# Patient Record
Sex: Female | Born: 1945 | Race: White | Hispanic: No | Marital: Married | Smoking: Never smoker
Health system: Southern US, Community
[De-identification: ages and names within clinical notes are randomized; demographics above are authoritative.]

## PROBLEM LIST (undated history)

## (undated) DIAGNOSIS — G4733 Obstructive sleep apnea (adult) (pediatric): Secondary | ICD-10-CM

## (undated) DIAGNOSIS — Z974 Presence of external hearing-aid: Secondary | ICD-10-CM

## (undated) DIAGNOSIS — M5137 Other intervertebral disc degeneration, lumbosacral region: Secondary | ICD-10-CM

## (undated) DIAGNOSIS — Z9889 Other specified postprocedural states: Secondary | ICD-10-CM

## (undated) DIAGNOSIS — D509 Iron deficiency anemia, unspecified: Secondary | ICD-10-CM

## (undated) DIAGNOSIS — G459 Transient cerebral ischemic attack, unspecified: Secondary | ICD-10-CM

## (undated) DIAGNOSIS — Z8679 Personal history of other diseases of the circulatory system: Secondary | ICD-10-CM

## (undated) DIAGNOSIS — E119 Type 2 diabetes mellitus without complications: Secondary | ICD-10-CM

## (undated) DIAGNOSIS — M51379 Other intervertebral disc degeneration, lumbosacral region without mention of lumbar back pain or lower extremity pain: Secondary | ICD-10-CM

## (undated) DIAGNOSIS — I639 Cerebral infarction, unspecified: Secondary | ICD-10-CM

## (undated) DIAGNOSIS — R252 Cramp and spasm: Secondary | ICD-10-CM

## (undated) DIAGNOSIS — Z7901 Long term (current) use of anticoagulants: Secondary | ICD-10-CM

## (undated) DIAGNOSIS — N3946 Mixed incontinence: Secondary | ICD-10-CM

## (undated) DIAGNOSIS — T8859XA Other complications of anesthesia, initial encounter: Secondary | ICD-10-CM

## (undated) DIAGNOSIS — D6859 Other primary thrombophilia: Secondary | ICD-10-CM

## (undated) DIAGNOSIS — Z8673 Personal history of transient ischemic attack (TIA), and cerebral infarction without residual deficits: Secondary | ICD-10-CM

## (undated) DIAGNOSIS — J189 Pneumonia, unspecified organism: Secondary | ICD-10-CM

## (undated) DIAGNOSIS — D649 Anemia, unspecified: Secondary | ICD-10-CM

## (undated) DIAGNOSIS — K219 Gastro-esophageal reflux disease without esophagitis: Secondary | ICD-10-CM

## (undated) DIAGNOSIS — Z794 Long term (current) use of insulin: Secondary | ICD-10-CM

## (undated) DIAGNOSIS — D759 Disease of blood and blood-forming organs, unspecified: Secondary | ICD-10-CM

## (undated) DIAGNOSIS — Z87898 Personal history of other specified conditions: Secondary | ICD-10-CM

## (undated) DIAGNOSIS — E7800 Pure hypercholesterolemia, unspecified: Secondary | ICD-10-CM

## (undated) DIAGNOSIS — R112 Nausea with vomiting, unspecified: Secondary | ICD-10-CM

## (undated) DIAGNOSIS — N183 Chronic kidney disease, stage 3 unspecified: Secondary | ICD-10-CM

## (undated) DIAGNOSIS — M1990 Unspecified osteoarthritis, unspecified site: Secondary | ICD-10-CM

## (undated) DIAGNOSIS — M5030 Other cervical disc degeneration, unspecified cervical region: Secondary | ICD-10-CM

## (undated) DIAGNOSIS — G4730 Sleep apnea, unspecified: Secondary | ICD-10-CM

## (undated) DIAGNOSIS — I10 Essential (primary) hypertension: Secondary | ICD-10-CM

## (undated) DIAGNOSIS — K5909 Other constipation: Secondary | ICD-10-CM

## (undated) DIAGNOSIS — E039 Hypothyroidism, unspecified: Secondary | ICD-10-CM

## (undated) DIAGNOSIS — I499 Cardiac arrhythmia, unspecified: Secondary | ICD-10-CM

## (undated) DIAGNOSIS — J45909 Unspecified asthma, uncomplicated: Secondary | ICD-10-CM

## (undated) DIAGNOSIS — Z86718 Personal history of other venous thrombosis and embolism: Secondary | ICD-10-CM

## (undated) DIAGNOSIS — Z973 Presence of spectacles and contact lenses: Secondary | ICD-10-CM

## (undated) DIAGNOSIS — E785 Hyperlipidemia, unspecified: Secondary | ICD-10-CM

## (undated) DIAGNOSIS — D689 Coagulation defect, unspecified: Secondary | ICD-10-CM

## (undated) DIAGNOSIS — I493 Ventricular premature depolarization: Secondary | ICD-10-CM

## (undated) DIAGNOSIS — M810 Age-related osteoporosis without current pathological fracture: Secondary | ICD-10-CM

## (undated) DIAGNOSIS — R0600 Dyspnea, unspecified: Secondary | ICD-10-CM

## (undated) DIAGNOSIS — E669 Obesity, unspecified: Secondary | ICD-10-CM

## (undated) DIAGNOSIS — H3530 Unspecified macular degeneration: Secondary | ICD-10-CM

## (undated) DIAGNOSIS — R011 Cardiac murmur, unspecified: Secondary | ICD-10-CM

## (undated) DIAGNOSIS — J4520 Mild intermittent asthma, uncomplicated: Secondary | ICD-10-CM

## (undated) HISTORY — PX: TONSILLECTOMY AND ADENOIDECTOMY: SUR1326

## (undated) HISTORY — DX: Ventricular premature depolarization: I49.3

## (undated) HISTORY — DX: Other primary thrombophilia: D68.59

## (undated) HISTORY — DX: Essential (primary) hypertension: I10

## (undated) HISTORY — DX: Gastro-esophageal reflux disease without esophagitis: K21.9

## (undated) HISTORY — PX: FOOT SURGERY: SHX648

## (undated) HISTORY — DX: Long term (current) use of anticoagulants: Z79.01

## (undated) HISTORY — PX: TONSILLECTOMY: SUR1361

## (undated) HISTORY — PX: BLEPHAROPLASTY: SUR158

## (undated) HISTORY — DX: Unspecified osteoarthritis, unspecified site: M19.90

## (undated) HISTORY — DX: Obesity, unspecified: E66.9

## (undated) HISTORY — PX: COLONOSCOPY: SHX174

## (undated) HISTORY — PX: CHOLECYSTECTOMY: SHX55

## (undated) HISTORY — DX: Coagulation defect, unspecified: D68.9

## (undated) HISTORY — PX: ABDOMINAL HYSTERECTOMY: SHX81

## (undated) HISTORY — PX: JOINT REPLACEMENT: SHX530

## (undated) HISTORY — DX: Pure hypercholesterolemia, unspecified: E78.00

## (undated) HISTORY — PX: FOOT TENDON SURGERY: SHX958

## (undated) HISTORY — DX: Transient cerebral ischemic attack, unspecified: G45.9

## (undated) HISTORY — DX: Other specified postprocedural states: Z98.890

## (undated) HISTORY — DX: Cardiac arrhythmia, unspecified: I49.9

## (undated) HISTORY — PX: EAR BIOPSY: SHX1480

## (undated) HISTORY — PX: HAND SURGERY: SHX662

---

## 1986-09-05 HISTORY — PX: TOTAL ABDOMINAL HYSTERECTOMY: SHX209

## 1986-09-05 HISTORY — PX: TOTAL VAGINAL HYSTERECTOMY: SHX2548

## 1991-09-06 DIAGNOSIS — Z86711 Personal history of pulmonary embolism: Secondary | ICD-10-CM

## 1991-09-06 HISTORY — PX: CHOLECYSTECTOMY: SHX55

## 1991-09-06 HISTORY — DX: Personal history of pulmonary embolism: Z86.711

## 1991-09-06 HISTORY — PX: CHOLECYSTECTOMY, LAPAROSCOPIC: SHX56

## 1992-09-05 HISTORY — PX: CARPAL TUNNEL RELEASE: SHX101

## 1999-11-17 ENCOUNTER — Inpatient Hospital Stay (HOSPITAL_COMMUNITY): Admission: AD | Admit: 1999-11-17 | Discharge: 1999-11-18 | Payer: Self-pay | Admitting: Cardiovascular Disease

## 2000-11-23 ENCOUNTER — Encounter: Payer: Self-pay | Admitting: Specialist

## 2000-11-29 ENCOUNTER — Ambulatory Visit (HOSPITAL_COMMUNITY): Admission: RE | Admit: 2000-11-29 | Discharge: 2000-11-29 | Payer: Self-pay | Admitting: Specialist

## 2004-05-25 ENCOUNTER — Ambulatory Visit (HOSPITAL_BASED_OUTPATIENT_CLINIC_OR_DEPARTMENT_OTHER): Admission: RE | Admit: 2004-05-25 | Discharge: 2004-05-25 | Payer: Self-pay | Admitting: Orthopedic Surgery

## 2004-05-25 ENCOUNTER — Ambulatory Visit (HOSPITAL_COMMUNITY): Admission: RE | Admit: 2004-05-25 | Discharge: 2004-05-25 | Payer: Self-pay | Admitting: Orthopedic Surgery

## 2004-06-01 ENCOUNTER — Ambulatory Visit (HOSPITAL_COMMUNITY): Admission: RE | Admit: 2004-06-01 | Discharge: 2004-06-01 | Payer: Self-pay | Admitting: Orthopedic Surgery

## 2004-09-05 HISTORY — PX: OTHER SURGICAL HISTORY: SHX169

## 2005-03-05 HISTORY — PX: TOTAL KNEE ARTHROPLASTY: SHX125

## 2011-05-20 ENCOUNTER — Encounter: Payer: Self-pay | Admitting: *Deleted

## 2011-05-20 ENCOUNTER — Encounter: Payer: Self-pay | Admitting: Cardiovascular Disease

## 2011-05-23 ENCOUNTER — Ambulatory Visit (INDEPENDENT_AMBULATORY_CARE_PROVIDER_SITE_OTHER): Payer: Medicare Other | Admitting: Internal Medicine

## 2011-05-23 ENCOUNTER — Encounter: Payer: Self-pay | Admitting: Internal Medicine

## 2011-05-23 DIAGNOSIS — R42 Dizziness and giddiness: Secondary | ICD-10-CM

## 2011-05-23 DIAGNOSIS — I493 Ventricular premature depolarization: Secondary | ICD-10-CM | POA: Insufficient documentation

## 2011-05-23 DIAGNOSIS — I5030 Unspecified diastolic (congestive) heart failure: Secondary | ICD-10-CM | POA: Insufficient documentation

## 2011-05-23 DIAGNOSIS — I4949 Other premature depolarization: Secondary | ICD-10-CM

## 2011-05-23 DIAGNOSIS — I110 Hypertensive heart disease with heart failure: Secondary | ICD-10-CM | POA: Insufficient documentation

## 2011-05-23 DIAGNOSIS — I10 Essential (primary) hypertension: Secondary | ICD-10-CM | POA: Insufficient documentation

## 2011-05-23 DIAGNOSIS — I472 Ventricular tachycardia: Secondary | ICD-10-CM

## 2011-05-23 HISTORY — DX: Essential (primary) hypertension: I10

## 2011-05-23 HISTORY — DX: Ventricular premature depolarization: I49.3

## 2011-05-23 HISTORY — DX: Hypertensive heart disease with heart failure: I11.0

## 2011-05-23 HISTORY — DX: Dizziness and giddiness: R42

## 2011-05-23 NOTE — Progress Notes (Signed)
Pamela [name] is a pleasant 65 y.o. WF patient with a h/o obesity, HTN, DM, and protein S deficiency who presents for further evaluation of PVCs and palpitations.  She reports that over the past 2-3 years she has had palpitations.  These are typically brief, lasting only 1-2 seconds.  She describes these as "skipped beats".  They are worse when she is tired.  She also finds that walking long distances or heavy house work will bring them on.  She denies associated chest pain or SOB.  She reports occasional mild dizziness.  She was evaluated by her PCP who placed an event monitor.  This revealed PVCs as well as NSVT. She reports having a normal stress test "years ago".   She had an echo 2010 which revealed preserved EF at that time. She reports occasional postural dizziness.  This occurs during the night when she gets up to go to the bathroom, or after standing after a long car ride.   She denies symptoms of chest pain, shortness of breath, orthopnea, PND, lower extremity edema, presyncope, syncope, or neurologic sequela. The patient is tolerating medications without difficulties and is otherwise without complaint today.   Past Medical History  Diagnosis Date  . HTN (hypertension)   . GERD (gastroesophageal reflux disease)   . Hypercholesteremia   . Sinusitis   . Diabetes mellitus   . Obesity   . DJD (degenerative joint disease)   . PVC's (premature ventricular contractions)   . Protein S deficiency     prior DVT L knee, L arm DVT,  followed by Dr Kathrin Ruddy at [facility]   Past Surgical History  Procedure Date  . Tonsillectomy   . Total abdominal hysterectomy 1988  . Cholecystectomy 1993  . Left knee replacement 2006  . Carpal tunnel release 1994    bilateral    Current Outpatient Prescriptions  Medication Sig Dispense Refill  . aspirin 325 MG tablet Take 325 mg by mouth 2 (two) times daily.        . carvedilol (COREG) 12.5 MG tablet Take 12.5 mg by mouth 2 (two) times daily with a meal.         . celecoxib (CELEBREX) 200 MG capsule Take 200 mg by mouth 2 (two) times daily.        . Cinnamon 500 MG capsule Take 500 mg by mouth daily.        Marland Kitchen doxazosin (CARDURA) 4 MG tablet Take 4 mg by mouth at bedtime.        . furosemide (LASIX) 20 MG tablet Take 20 mg by mouth every other day.        . Multiple Vitamins-Minerals (ONE-A-DAY WOMENS 50 PLUS PO) Take 1 tablet by mouth daily.        . pioglitazone-metformin (ACTOPLUS MET) 15-850 MG per tablet Take 1 tablet by mouth 2 (two) times daily with a meal.        . polycarbophil (FIBERCON) 625 MG tablet Take 625 mg by mouth daily.        . repaglinide (PRANDIN) 2 MG tablet Take 2 mg by mouth 3 (three) times daily before meals.        . traZODone (DESYREL) 50 MG tablet Take 50 mg by mouth 2 (two) times daily.        . valsartan (DIOVAN) 160 MG tablet Take 160 mg by mouth 2 (two) times daily.          Allergies  Allergen Reactions  . Amoxapine And Related  Hives  . Darvon Nausea And Vomiting  . Ketek (Telithromycin)   . Naproxen Hives    History   Social History  . Marital Status: Married    Spouse Name: N/A    Number of Children: N/A  . Years of Education: N/A   Occupational History  . Not on file.   Social History Main Topics  . Smoking status: Never Smoker   . Smokeless tobacco: Not on file  . Alcohol Use: No  . Drug Use: No  . Sexually Active: Not on file   Other Topics Concern  . Not on file   Social History Narrative   Lives with spouse in [place].  2 grown daughters.Retired Print production planner      Family History  Problem Relation Age of Onset  . Cirrhosis Mother   . Coronary artery disease Father   . Protein S deficiency Sister     prior DVT  . Antithrombin III deficiency Mother     multiple emboli  . Protein S deficiency Daughter   She denies FH of sudden death or arrhythmia  ROS- All systems are reviewed and negative except as per the HPI above  Physical Exam: Filed Vitals:   05/23/11 1456  BP:  132/80  Pulse: 72  Height: 5\' 4"  (1.626 m)  Weight: 216 lb 6.4 oz (98.158 kg)    GEN- The patient is obese appearing, alert and oriented x 3 today.   Head- normocephalic, atraumatic Eyes-  Sclera clear, conjunctiva pink Ears- hearing intact Oropharynx- clear Neck- supple, no JVP Lymph- no cervical lymphadenopathy Lungs- Clear to ausculation bilaterally, normal work of breathing Heart- Regular rate and rhythm, no murmurs, rubs or gallops, PMI not laterally displaced GI- soft, NT, ND, + BS Extremities- no clubbing, cyanosis, or edema MS- no significant deformity or atrophy Skin- no rash or lesion Psych- euthymic mood, full affect Neuro- strength and sensation are intact  EKG today reveals sinus rhythm 72 bpm with PVCs, she has 2 PVC morphologies on her ekg today (#1 is a LBB L superior axis, #2 is a RBB superior axis).  The PVCs are not closely coupled to the preceding T wave.  Event monitor from 9/12 is reviewed and reveals sinus rhythm with occasional PVCs.  There are at least 2 separate PVC morphologies.  PVCs are not closely coupled to T waves.  A single 5 beat run of NSVT is observed at 120-130 bpm.  Assessment and Plan:

## 2011-05-23 NOTE — Assessment & Plan Note (Signed)
Stable No change required today   She will return in 5 weeks for further assessment after her stress test and echo results are available.

## 2011-05-23 NOTE — Assessment & Plan Note (Signed)
The patient has PVCs and nonsustained ventricular tachycardia.  She has at least 2 separate PVC morphologies.  She has been asymptomatic with her NSVT.  Though in 2010 her EF was normal, I think that it is prudent at this time to repeat her echo to evaluate for any structural changes since that time.  I will also obtain a lexiscan myoview to evaluate for ischemic as a cause.  Given her severe knee arthritis, she does not feel that she can walk on a treadmill. She will continue coreg. I have recommended that she have a thyroid profile obtained by her PCP. If her above workup is normal, no further cardiac testing is planned at this time.

## 2011-05-23 NOTE — Assessment & Plan Note (Signed)
The patient has occasional postural dizziness.  This is most likely related to hypotension with standing and likely due to her medications.  Her combination of coreg, doxazosin, lasix, and diovan are almost certainly the cause.  If her symptoms do not improve, I would recommend that her doxasozin be stopped. I have instructed her to perform isometric exercises before standing and to assume a recombant position if she develops postural dizziness to avoid fall/ injury. Adequate hydration is also encouraged. No further workup is planned at his time.

## 2011-05-23 NOTE — Patient Instructions (Addendum)
Your physician has requested that you have an echocardiogram. Echocardiography is a painless test that uses sound waves to create images of your heart. It provides your doctor with information about the size and shape of your heart and how well your heart's chambers and valves are working. This procedure takes approximately one hour. There are no restrictions for this procedure.  Your physician has requested that you have a lexiscan myoview. For further information please visit [email or web address] Please follow instruction sheet, as given.  Your physician recommends that you schedule a follow-up appointment in: 4 weeks

## 2011-05-25 ENCOUNTER — Encounter: Payer: Self-pay | Admitting: *Deleted

## 2011-05-30 ENCOUNTER — Encounter: Payer: Self-pay | Admitting: *Deleted

## 2011-06-06 ENCOUNTER — Ambulatory Visit (HOSPITAL_COMMUNITY): Payer: Medicare Other | Attending: Internal Medicine | Admitting: Radiology

## 2011-06-06 ENCOUNTER — Ambulatory Visit (HOSPITAL_BASED_OUTPATIENT_CLINIC_OR_DEPARTMENT_OTHER): Payer: Medicare Other | Admitting: Radiology

## 2011-06-06 DIAGNOSIS — I493 Ventricular premature depolarization: Secondary | ICD-10-CM

## 2011-06-06 DIAGNOSIS — D6859 Other primary thrombophilia: Secondary | ICD-10-CM | POA: Insufficient documentation

## 2011-06-06 DIAGNOSIS — R42 Dizziness and giddiness: Secondary | ICD-10-CM | POA: Insufficient documentation

## 2011-06-06 DIAGNOSIS — I4729 Other ventricular tachycardia: Secondary | ICD-10-CM | POA: Insufficient documentation

## 2011-06-06 DIAGNOSIS — I472 Ventricular tachycardia, unspecified: Secondary | ICD-10-CM | POA: Insufficient documentation

## 2011-06-06 DIAGNOSIS — I059 Rheumatic mitral valve disease, unspecified: Secondary | ICD-10-CM

## 2011-06-06 DIAGNOSIS — I4949 Other premature depolarization: Secondary | ICD-10-CM | POA: Insufficient documentation

## 2011-06-06 DIAGNOSIS — I080 Rheumatic disorders of both mitral and aortic valves: Secondary | ICD-10-CM | POA: Insufficient documentation

## 2011-06-06 DIAGNOSIS — R079 Chest pain, unspecified: Secondary | ICD-10-CM

## 2011-06-06 DIAGNOSIS — R002 Palpitations: Secondary | ICD-10-CM | POA: Insufficient documentation

## 2011-06-06 DIAGNOSIS — E669 Obesity, unspecified: Secondary | ICD-10-CM | POA: Insufficient documentation

## 2011-06-06 DIAGNOSIS — E119 Type 2 diabetes mellitus without complications: Secondary | ICD-10-CM | POA: Insufficient documentation

## 2011-06-06 DIAGNOSIS — I10 Essential (primary) hypertension: Secondary | ICD-10-CM | POA: Insufficient documentation

## 2011-06-06 MED ORDER — REGADENOSON 0.4 MG/5ML IV SOLN
0.4000 mg | Freq: Once | INTRAVENOUS | Status: AC
  Administered 2011-06-06: 0.4 mg via INTRAVENOUS

## 2011-06-06 MED ORDER — AMINOPHYLLINE 25 MG/ML IV SOLN
75.0000 mg | Freq: Once | INTRAVENOUS | Status: AC
  Administered 2011-06-06: 75 mg via INTRAVENOUS

## 2011-06-06 MED ORDER — TECHNETIUM TC 99M TETROFOSMIN IV KIT
11.0000 | PACK | Freq: Once | INTRAVENOUS | Status: AC | PRN
  Administered 2011-06-06: 11 via INTRAVENOUS

## 2011-06-06 MED ORDER — TECHNETIUM TC 99M TETROFOSMIN IV KIT
33.0000 | PACK | Freq: Once | INTRAVENOUS | Status: AC | PRN
  Administered 2011-06-06: 33 via INTRAVENOUS

## 2011-06-06 NOTE — Progress Notes (Signed)
[facility] SITE 3 NUCLEAR MED [address] [place] [address] [phone]  Cardiology Nuclear Med Study  Pamela [name] is a 65 y.o. female [id number] 05/24/1946   Nuclear Med Background Indication for Stress Test:  Evaluation for Ischemia History: 2010 Echo: NL, '01 GXT: NL, '01 Heart Catheterization: EF 65% NL and '01 Myocardial Perfusion Study: (+) false Anterior wall ischemia Cardiac Risk Factors: Family History - CAD, Hypertension, Lipids and NIDDM  Symptoms:  Dizziness and Palpitations   Nuclear Pre-Procedure Caffeine/Decaff Intake:  4 pm NPO After: 11:00pm   Lungs:  clear IV 0.9% NS with Angio Cath:  22g  IV Site: R Hand  IV Started by:  Cathlyn Parsons, RN  Chest Size (in):  44 Cup Size: C  Height: 5\' 4"  (1.626 m)  Weight:  214 lb (97.07 kg)  BMI:  Body mass index is 36.73 kg/(m^2). Tech Comments:  Coreg held x 15 hrs. This patient had Lexiscan and had continuing symptoms and finally all over joint pain. She was given 75mg  IV of Aminophyline with a 10cc NS flush. This reversed all the symptoms and she felt fine. Then she was sitting in the waiting room and she started having 5/10 back pain between her shoulder blades. Her pictures were checked and they were shown to P.Ross DOD for this day. The patient now a 2/10 Dr.Ross sent her home without any problems. S.Williams EMTP    Nuclear Med Study 1 or 2 day study: 1 day  Stress Test Type:  Eugenie Birks  Reading MD: Cassell Clement, MD  Order Authorizing Provider:  J.Allred  Resting Radionuclide: Technetium 79m Tetrofosmin  Resting Radionuclide Dose: 11.0 mCi   Stress Radionuclide:  Technetium 4m Tetrofosmin  Stress Radionuclide Dose: 33.0 mCi           Stress Protocol Rest HR: 68 Stress HR: 95  Rest BP: 160/73 Stress BP: 165/75  Exercise Time (min): n/a METS: n/a   Predicted Max HR: 154 bpm % Max HR: 61.69 bpm Rate Pressure Product: 57846   Dose of Adenosine (mg):  n/a Dose of Lexiscan: 0.4  mg  Dose of Atropine (mg): n/a Dose of Dobutamine: n/a mcg/kg/min (at max HR)  Stress Test Technologist: Milana Na, EMT-P  Nuclear Technologist:  Domenic Polite, CNMT     Rest Procedure:  Myocardial perfusion imaging was performed at rest 45 minutes following the intravenous administration of Technetium 62m Tetrofosmin. Rest ECG: NSR  Stress Procedure:  The patient received IV Lexiscan 0.4 mg over 15-seconds.  Technetium 82m Tetrofosmin injected at 30-seconds.  There were no significant changes and freq pvcs(normal per patient) with Lexiscan.  Quantitative spect images were obtained after a 45 minute delay. Stress ECG: No significant ST segment change suggestive of ischemia.  QPS Raw Data Images:  Normal; no motion artifact; normal heart/lung ratio. Stress Images:  Normal homogeneous uptake in all areas of the myocardium. Rest Images:  Normal homogeneous uptake in all areas of the myocardium. Subtraction (SDS):  No evidence of ischemia. Transient Ischemic Dilatation (Normal <1.22):  0.95 Lung/Heart Ratio (Normal <0.45):  0.19  Quantitative Gated Spect Images QGS EDV:  n/a QGS ESV:  n/a QGS cine images:  Study not gated QGS EF: Study not gated  Impression Exercise Capacity:  Lexiscan with no exercise. BP Response:  Normal blood pressure response. Clinical Symptoms:  Mild chest pain/dyspnea. ECG Impression:  No significant ST segment change suggestive of ischemia.  Frequent PVCs Comparison with Prior Nuclear Study: No images to compare  Overall Impression:  Low risk stress nuclear study.  No ischemia.  Study non-gated.    Cassell Clement

## 2011-06-09 ENCOUNTER — Telehealth: Payer: Self-pay | Admitting: Internal Medicine

## 2011-06-09 NOTE — Telephone Encounter (Signed)
Pt wants to know tests results.

## 2011-06-09 NOTE — Telephone Encounter (Signed)
Returned call to patient and gave her the results of both test

## 2011-06-29 ENCOUNTER — Encounter: Payer: Self-pay | Admitting: Internal Medicine

## 2011-06-29 ENCOUNTER — Ambulatory Visit: Payer: Medicare Other | Admitting: Internal Medicine

## 2011-06-29 DIAGNOSIS — I493 Ventricular premature depolarization: Secondary | ICD-10-CM

## 2011-06-29 DIAGNOSIS — I10 Essential (primary) hypertension: Secondary | ICD-10-CM

## 2011-06-29 NOTE — Progress Notes (Signed)
Addended by: Hillis Range on: 06/29/2011 01:44 PM   Modules accepted: Level of Service

## 2011-06-29 NOTE — Patient Instructions (Signed)
Your physician wants you to follow-up in: 6 months with Dr. Allred. You will receive a reminder letter in the mail two months in advance. If you don't receive a letter, please call our office to schedule the follow-up appointment.  

## 2011-06-29 NOTE — Assessment & Plan Note (Signed)
Stable No change required today  

## 2011-06-29 NOTE — Assessment & Plan Note (Addendum)
Stable at this time Recent echo reveals preserved EF and myoview is low risk No further CV workup is planned at this time No medication changes today.  If PVCs worsen, we will consider flecainide vs ablation

## 2011-06-29 NOTE — Progress Notes (Addendum)
Pamela [name] is a pleasant 65 y.o. WF patient with a h/o obesity, HTN, DM, and protein S deficiency who presents for follow-up of PVCs and palpitations.   She continues to have occasional palpitations but feels that these are stable.  She denies associated chest pain or SOB.  She reports occasional mild dizziness.    She denies symptoms of chest pain, shortness of breath, orthopnea, PND, lower extremity edema, presyncope, syncope, or neurologic sequela. The patient is tolerating medications without difficulties and is otherwise without complaint today.   Past Medical History  Diagnosis Date  . HTN (hypertension)   . GERD (gastroesophageal reflux disease)   . Hypercholesteremia   . Sinusitis   . Diabetes mellitus   . Obesity   . DJD (degenerative joint disease)   . PVC's (premature ventricular contractions)   . Protein S deficiency     prior DVT L knee, L arm DVT,  followed by Dr Kathrin Ruddy at [facility]   Past Surgical History  Procedure Date  . Tonsillectomy   . Total abdominal hysterectomy 1988  . Cholecystectomy 1993  . Left knee replacement 2006  . Carpal tunnel release 1994    bilateral    Current Outpatient Prescriptions  Medication Sig Dispense Refill  . aspirin 325 MG tablet Take 325 mg by mouth 2 (two) times daily.        . carvedilol (COREG) 12.5 MG tablet Take 12.5 mg by mouth 2 (two) times daily with a meal.        . celecoxib (CELEBREX) 200 MG capsule Take 200 mg by mouth daily.       . Cinnamon 500 MG capsule Take 500 mg by mouth daily.        Marland Kitchen doxazosin (CARDURA) 4 MG tablet Take 4 mg by mouth at bedtime.        . furosemide (LASIX) 20 MG tablet Take 20 mg by mouth every other day.        . Multiple Vitamins-Minerals (ONE-A-DAY WOMENS 50 PLUS PO) Take 1 tablet by mouth daily.        . pioglitazone-metformin (ACTOPLUS MET) 15-850 MG per tablet Take 1 tablet by mouth 2 (two) times daily with a meal.        . polycarbophil (FIBERCON) 625 MG tablet Take 625 mg by mouth  daily.        . repaglinide (PRANDIN) 2 MG tablet Take 4 mg by mouth 3 (three) times daily before meals.       . traZODone (DESYREL) 50 MG tablet Take 50 mg by mouth 2 (two) times daily.        . valsartan (DIOVAN) 160 MG tablet Take 160 mg by mouth 2 (two) times daily.          Allergies  Allergen Reactions  . Amoxapine And Related Hives  . Darvon Nausea And Vomiting  . Ketek (Telithromycin)   . Naproxen Hives    History   Social History  . Marital Status: Married    Spouse Name: N/A    Number of Children: N/A  . Years of Education: N/A   Occupational History  . Not on file.   Social History Main Topics  . Smoking status: Never Smoker   . Smokeless tobacco: Not on file  . Alcohol Use: No  . Drug Use: No  . Sexually Active: Not on file   Other Topics Concern  . Not on file   Social History Narrative   Lives with spouse in  [place].  2 grown daughters.Retired Print production planner      Family History  Problem Relation Age of Onset  . Cirrhosis Mother   . Coronary artery disease Father   . Protein S deficiency Sister     prior DVT  . Antithrombin III deficiency Mother     multiple emboli  . Protein S deficiency Daughter    She denies FH of sudden death or arrhythmia  ROS- All systems are reviewed and negative except as per the HPI above  Physical Exam: Filed Vitals:   06/29/11 1122  BP: 124/70  Pulse: 63  Height: 5\' 4"  (1.626 m)  Weight: 218 lb 12.8 oz (99.247 kg)   GEN- The patient is obese appearing, alert and oriented x 3 today.   Head- normocephalic, atraumatic Eyes-  Sclera clear, conjunctiva pink Ears- hearing intact Oropharynx- clear Neck- supple, no JVP Lymph- no cervical lymphadenopathy Lungs- Clear to ausculation bilaterally, normal work of breathing Heart- Regular rate and rhythm, no murmurs, rubs or gallops, PMI not laterally displaced GI- soft, NT, ND, + BS Extremities- no clubbing, cyanosis, or edema MS- no significant deformity or  atrophy Skin- no rash or lesion Psych- euthymic mood, full affect Neuro- strength and sensation are intact  Event monitor from 9/12 is reviewed and reveals sinus rhythm with occasional PVCs.  There are at least 2 separate PVC morphologies.  PVCs are not closely coupled to T waves.  A single 5 beat run of NSVT is observed at 120-130 bpm.  Assessment and Plan:      See above

## 2011-06-30 ENCOUNTER — Encounter: Payer: Self-pay | Admitting: Internal Medicine

## 2011-12-28 ENCOUNTER — Ambulatory Visit (INDEPENDENT_AMBULATORY_CARE_PROVIDER_SITE_OTHER): Payer: Medicare Other | Admitting: Internal Medicine

## 2011-12-28 ENCOUNTER — Encounter: Payer: Self-pay | Admitting: Internal Medicine

## 2011-12-28 VITALS — BP 138/76 | HR 68 | Resp 18 | Ht 65.0 in | Wt 212.1 lb

## 2011-12-28 DIAGNOSIS — I4949 Other premature depolarization: Secondary | ICD-10-CM

## 2011-12-28 DIAGNOSIS — I10 Essential (primary) hypertension: Secondary | ICD-10-CM

## 2011-12-28 DIAGNOSIS — I493 Ventricular premature depolarization: Secondary | ICD-10-CM

## 2011-12-28 NOTE — Assessment & Plan Note (Signed)
Controlled. No changes. 

## 2011-12-28 NOTE — Progress Notes (Signed)
PCP:  Lucila Maine, MD, MD  The patient presents today for routine electrophysiology followup.  Since last being seen in our clinic, the patient reports doing very well.  She has rare palpitations.  Today, she denies symptoms of  chest pain, shortness of breath,  dizziness, presyncope, or syncope.  She has stable mild pedal edema.  The patient feels that she is tolerating medications without difficulties and is otherwise without complaint today.   Past Medical History  Diagnosis Date  . HTN (hypertension)   . GERD (gastroesophageal reflux disease)   . Hypercholesteremia   . Sinusitis   . Diabetes mellitus   . Obesity   . DJD (degenerative joint disease)   . PVC's (premature ventricular contractions)   . Protein S deficiency     prior DVT L knee, L arm DVT,  followed by Dr Kathrin Ruddy at [facility]   Past Surgical History  Procedure Date  . Tonsillectomy   . Total abdominal hysterectomy 1988  . Cholecystectomy 1993  . Left knee replacement 2006  . Carpal tunnel release 1994    bilateral    Current Outpatient Prescriptions  Medication Sig Dispense Refill  . aspirin 325 MG tablet Take 325 mg by mouth 2 (two) times daily.        . carvedilol (COREG) 12.5 MG tablet Take 12.5 mg by mouth 2 (two) times daily with a meal.        . celecoxib (CELEBREX) 200 MG capsule Take 200 mg by mouth daily.       . Cinnamon 500 MG capsule Take 1,000 mg by mouth daily.       Marland Kitchen doxazosin (CARDURA) 4 MG tablet Take 4 mg by mouth at bedtime.        . furosemide (LASIX) 20 MG tablet Take 20 mg by mouth every other day.        . Multiple Vitamins-Minerals (ONE-A-DAY WOMENS 50 PLUS PO) Take 1 tablet by mouth daily.        . pioglitazone-metformin (ACTOPLUS MET) 15-850 MG per tablet Take 1 tablet by mouth 2 (two) times daily with a meal.        . polycarbophil (FIBERCON) 625 MG tablet Take 625 mg by mouth daily.        . repaglinide (PRANDIN) 2 MG tablet Take 4 mg by mouth 3 (three) times daily before meals.         . traZODone (DESYREL) 50 MG tablet Take 50 mg by mouth 2 (two) times daily.        . valsartan (DIOVAN) 160 MG tablet Take 160 mg by mouth 2 (two) times daily.          Allergies  Allergen Reactions  . Amoxapine And Related Hives  . Darvon Nausea And Vomiting  . Ketek (Telithromycin)   . Naproxen Hives    History   Social History  . Marital Status: Married    Spouse Name: N/A    Number of Children: N/A  . Years of Education: N/A   Occupational History  . Not on file.   Social History Main Topics  . Smoking status: Never Smoker   . Smokeless tobacco: Not on file  . Alcohol Use: No  . Drug Use: No  . Sexually Active: Not on file   Other Topics Concern  . Not on file   Social History Narrative   Lives with spouse in [place].  2 grown daughters.Retired Print production planner      Family History  Problem  Relation Age of Onset  . Cirrhosis Mother   . Coronary artery disease Father   . Protein S deficiency Sister     prior DVT  . Antithrombin III deficiency Mother     multiple emboli  . Protein S deficiency Daughter     Physical Exam: Filed Vitals:   12/28/11 0949  BP: 138/76  Pulse: 68  Resp: 18  Height: 5\' 5"  (1.651 m)  Weight: 212 lb 1.9 oz (96.217 kg)    GEN- The patient is well appearing, alert and oriented x 3 today.   Head- normocephalic, atraumatic Eyes-  Sclera clear, conjunctiva pink Ears- hearing intact Oropharynx- clear Neck- supple, no JVP Lymph- no cervical lymphadenopathy Lungs- Clear to ausculation bilaterally, normal work of breathing Heart- Regular rate and rhythm, no murmurs, rubs or gallops, PMI not laterally displaced GI- soft, NT, ND, + BS Extremities- no clubbing, cyanosis, trace ankle edema  ekg today  Sinus rhythm 68 bpm, normal ekg  Assessment and Plan:

## 2011-12-28 NOTE — Patient Instructions (Signed)
Your physician wants you to follow-up in: 12 months with Dr Allred You will receive a reminder letter in the mail two months in advance. If you don't receive a letter, please call our office to schedule the follow-up appointment.  

## 2011-12-28 NOTE — Assessment & Plan Note (Signed)
Stable No change required today  Salt restriction and avoidance of NSAIDs advised She wears support hose for edema

## 2012-12-17 ENCOUNTER — Encounter: Payer: Self-pay | Admitting: Internal Medicine

## 2012-12-27 ENCOUNTER — Ambulatory Visit: Payer: BC Managed Care – PPO | Admitting: Internal Medicine

## 2013-01-21 ENCOUNTER — Encounter: Payer: Self-pay | Admitting: Internal Medicine

## 2013-01-21 ENCOUNTER — Ambulatory Visit (INDEPENDENT_AMBULATORY_CARE_PROVIDER_SITE_OTHER): Payer: Medicare Other | Admitting: Internal Medicine

## 2013-01-21 VITALS — BP 142/71 | HR 63 | Ht 64.0 in | Wt 206.8 lb

## 2013-01-21 DIAGNOSIS — R42 Dizziness and giddiness: Secondary | ICD-10-CM

## 2013-01-21 DIAGNOSIS — I4949 Other premature depolarization: Secondary | ICD-10-CM

## 2013-01-21 DIAGNOSIS — I493 Ventricular premature depolarization: Secondary | ICD-10-CM

## 2013-01-21 DIAGNOSIS — I10 Essential (primary) hypertension: Secondary | ICD-10-CM

## 2013-01-21 LAB — CBC WITH DIFFERENTIAL/PLATELET
Basophils Absolute: 0 10*3/uL (ref 0.0–0.1)
Basophils Relative: 0.5 % (ref 0.0–3.0)
Eosinophils Absolute: 0.1 10*3/uL (ref 0.0–0.7)
Eosinophils Relative: 1.2 % (ref 0.0–5.0)
HCT: 35 % — ABNORMAL LOW (ref 36.0–46.0)
Hemoglobin: 12.1 g/dL (ref 12.0–15.0)
Lymphocytes Relative: 20.2 % (ref 12.0–46.0)
Lymphs Abs: 1.1 10*3/uL (ref 0.7–4.0)
MCHC: 34.5 g/dL (ref 30.0–36.0)
MCV: 89.3 fl (ref 78.0–100.0)
Monocytes Absolute: 0.5 10*3/uL (ref 0.1–1.0)
Monocytes Relative: 9 % (ref 3.0–12.0)
Neutro Abs: 3.8 10*3/uL (ref 1.4–7.7)
Neutrophils Relative %: 69.1 % (ref 43.0–77.0)
Platelets: 220 10*3/uL (ref 150.0–400.0)
RBC: 3.91 Mil/uL (ref 3.87–5.11)
RDW: 14.7 % — ABNORMAL HIGH (ref 11.5–14.6)
WBC: 5.4 10*3/uL (ref 4.5–10.5)

## 2013-01-21 LAB — T4, FREE: Free T4: 1.23 ng/dL (ref 0.60–1.60)

## 2013-01-21 LAB — BASIC METABOLIC PANEL
BUN: 15 mg/dL (ref 6–23)
CO2: 27 mEq/L (ref 19–32)
Calcium: 8.7 mg/dL (ref 8.4–10.5)
Chloride: 98 mEq/L (ref 96–112)
Creatinine, Ser: 0.9 mg/dL (ref 0.4–1.2)
GFR: 63.66 mL/min (ref >60.00)
Glucose, Bld: 181 mg/dL — ABNORMAL HIGH (ref 70–99)
Potassium: 4.8 mEq/L (ref 3.5–5.1)
Sodium: 133 mEq/L — ABNORMAL LOW (ref 135–145)

## 2013-01-21 LAB — MAGNESIUM: Magnesium: 1.2 mg/dL — ABNORMAL LOW (ref 1.5–2.5)

## 2013-01-21 MED ORDER — MECLIZINE HCL 12.5 MG PO TABS: 12.5000 mg | ORAL_TABLET | Freq: Three times a day (TID) | ORAL | Status: DC | PRN

## 2013-01-21 NOTE — Progress Notes (Signed)
PCP:  SCOTT, ROBERT, MD  The patient presents today for routine electrophysiology followup.  She reports that her PVCs have been more bothersome over the past month or so.  She also reports postural dizziness for 2-3 months.  This temporally corresponds to starting spironolactone.  In addition, she has vertiginous spinning sensation at night when in bed if she turns her head quickly.  Today, she denies symptoms of  chest pain, shortness of breath, presyncope, or syncope.  She has stable mild pedal edema.  The patient feels that she is tolerating medications without difficulties and is otherwise without complaint today.   Past Medical History  Diagnosis Date  . HTN (hypertension)   . GERD (gastroesophageal reflux disease)   . Hypercholesteremia   . Sinusitis   . Diabetes mellitus   . Obesity   . DJD (degenerative joint disease)   . PVC's (premature ventricular contractions)   . Protein S deficiency     prior DVT L knee, L arm DVT,  followed by Dr Steven Moll at [facility]   Past Surgical History  Procedure Laterality Date  . Tonsillectomy    . Total abdominal hysterectomy  1988  . Cholecystectomy  1993  . Left knee replacement  2006  . Carpal tunnel release  1994    bilateral    Current Outpatient Prescriptions  Medication Sig Dispense Refill  . amLODipine (NORVASC) 5 MG tablet Take 2.5 mg by mouth daily.      . aspirin 81 MG tablet Take 81 mg by mouth daily.      . atorvastatin (LIPITOR) 20 MG tablet Take 20 mg by mouth daily.      . carvedilol (COREG) 12.5 MG tablet Take 12.5 mg by mouth 2 (two) times daily with a meal.        . Cinnamon 500 MG capsule Take 1,000 mg by mouth daily.       . fish oil-omega-3 fatty acids 1000 MG capsule Take 2 g by mouth daily.      . Multiple Vitamins-Minerals (ONE-A-DAY WOMENS 50 PLUS PO) Take 1 tablet by mouth daily.        . omeprazole-sodium bicarbonate (ZEGERID) 40-1100 MG per capsule Take 1 capsule by mouth daily before breakfast.      .  pioglitazone-metformin (ACTOPLUS MET) 15-850 MG per tablet Take 1 tablet by mouth 2 (two) times daily with a meal.        . polycarbophil (FIBERCON) 625 MG tablet Take 625 mg by mouth daily.        . repaglinide (PRANDIN) 2 MG tablet Take 4 mg by mouth 3 (three) times daily before meals.       . spironolactone (ALDACTONE) 25 MG tablet Take 25 mg by mouth daily.      . traZODone (DESYREL) 50 MG tablet Take 50 mg by mouth 2 (two) times daily.        . valsartan (DIOVAN) 160 MG tablet Take 160 mg by mouth 2 (two) times daily.        . warfarin (COUMADIN) 1 MG tablet Take 1 mg by mouth as directed.      . warfarin (COUMADIN) 5 MG tablet Take 5 mg by mouth daily.      . meclizine (ANTIVERT) 12.5 MG tablet Take 1 tablet (12.5 mg total) by mouth 3 (three) times daily as needed.  30 tablet  0   No current facility-administered medications for this visit.    Allergies  Allergen Reactions  . Amoxapine And Related   Hives  . Darvon Nausea And Vomiting  . Ketek (Telithromycin)   . Naproxen Hives    History   Social History  . Marital Status: Married    Spouse Name: N/A    Number of Children: N/A  . Years of Education: N/A   Occupational History  . Not on file.   Social History Main Topics  . Smoking status: Never Smoker   . Smokeless tobacco: Not on file  . Alcohol Use: No  . Drug Use: No  . Sexually Active: Not on file   Other Topics Concern  . Not on file   Social History Narrative   Lives with spouse in [place].  2 grown daughters.   Retired office manager      Family History  Problem Relation Age of Onset  . Cirrhosis Mother   . Coronary artery disease Father   . Protein S deficiency Sister     prior DVT  . Antithrombin III deficiency Mother     multiple emboli  . Protein S deficiency Daughter     Physical Exam: Filed Vitals:   01/21/13 1153  BP: 142/71  Pulse: 63  Height: 5' 4" (1.626 m)  Weight: 206 lb 12.8 oz (93.804 kg)    GEN- The patient is well  appearing, alert and oriented x 3 today.   Head- normocephalic, atraumatic Eyes-  Sclera clear, conjunctiva pink Ears- hearing intact Oropharynx- clear Neck- supple, no JVP Lymph- no cervical lymphadenopathy Lungs- Clear to ausculation bilaterally, normal work of breathing Heart- Regular rate and rhythm, no murmurs, rubs or gallops, PMI not laterally displaced GI- soft, NT, ND, + BS Extremities- no clubbing, cyanosis, trace ankle edema  ekg today  Sinus rhythm 77 bpm with LBB L superior axis pvcs  Assessment and Plan:  1. PVCs More symptomatic recently Check electrolytes and TFTs Repeat echo  2. Postural dizziness Corresponds to spironolactone We may have to stop this medicine bmet today Adequate hydration and rising slowly when changing position is advised  3. Vertigo Meclizine prn  4. HTN Stable bmet Consider stopping spironolactone if symptoms are not improved upon return  Return in 2 months for further assessment 

## 2013-01-21 NOTE — Patient Instructions (Signed)
Your physician recommends that you schedule a follow-up appointment in: 2 months with Dr Johney Frame  Your physician has recommended you make the following change in your medication:  1) Start Meclizine    Your physician has requested that you have an echocardiogram. Echocardiography is a painless test that uses sound waves to create images of your heart. It provides your doctor with information about the size and shape of your heart and how well your heart's chambers and valves are working. This procedure takes approximately one hour. There are no restrictions for this procedure.    Your physician recommends that you return for lab work today:BMP/Mag/CBC/TSH/ Free T4

## 2013-01-22 ENCOUNTER — Telehealth (HOSPITAL_COMMUNITY): Payer: Self-pay | Admitting: *Deleted

## 2013-01-22 MED ORDER — MAGNESIUM OXIDE 400 MG PO TABS: 400.0000 mg | ORAL_TABLET | Freq: Two times a day (BID) | ORAL | Status: DC

## 2013-01-22 NOTE — Telephone Encounter (Signed)
Spoke with patient and she is going to start Mag 400mg  bid

## 2013-02-03 DIAGNOSIS — Z9889 Other specified postprocedural states: Secondary | ICD-10-CM | POA: Insufficient documentation

## 2013-02-03 HISTORY — DX: Other specified postprocedural states: Z98.890

## 2013-02-06 ENCOUNTER — Other Ambulatory Visit: Payer: Self-pay | Admitting: Internal Medicine

## 2013-02-06 ENCOUNTER — Ambulatory Visit (HOSPITAL_COMMUNITY): Payer: Medicare Other | Attending: Internal Medicine | Admitting: Radiology

## 2013-02-06 ENCOUNTER — Ambulatory Visit (INDEPENDENT_AMBULATORY_CARE_PROVIDER_SITE_OTHER): Payer: Medicare Other | Admitting: *Deleted

## 2013-02-06 DIAGNOSIS — I10 Essential (primary) hypertension: Secondary | ICD-10-CM

## 2013-02-06 DIAGNOSIS — I493 Ventricular premature depolarization: Secondary | ICD-10-CM

## 2013-02-06 DIAGNOSIS — I4949 Other premature depolarization: Secondary | ICD-10-CM | POA: Insufficient documentation

## 2013-02-06 DIAGNOSIS — R609 Edema, unspecified: Secondary | ICD-10-CM

## 2013-02-06 LAB — BASIC METABOLIC PANEL
BUN: 14 mg/dL (ref 6–23)
CO2: 27 mEq/L (ref 19–32)
Calcium: 8.7 mg/dL (ref 8.4–10.5)
Chloride: 98 mEq/L (ref 96–112)
Creatinine, Ser: 0.8 mg/dL (ref 0.4–1.2)
GFR: 73.6 mL/min (ref >60.00)
Glucose, Bld: 122 mg/dL — ABNORMAL HIGH (ref 70–99)
Potassium: 4.5 mEq/L (ref 3.5–5.1)
Sodium: 133 mEq/L — ABNORMAL LOW (ref 135–145)

## 2013-02-06 NOTE — Progress Notes (Signed)
Echocardiogram performed.  

## 2013-02-08 ENCOUNTER — Encounter: Payer: Self-pay | Admitting: Internal Medicine

## 2013-02-14 ENCOUNTER — Encounter: Payer: Self-pay | Admitting: Internal Medicine

## 2013-02-14 ENCOUNTER — Ambulatory Visit (INDEPENDENT_AMBULATORY_CARE_PROVIDER_SITE_OTHER): Payer: Medicare Other | Admitting: Internal Medicine

## 2013-02-14 ENCOUNTER — Other Ambulatory Visit: Payer: Self-pay | Admitting: *Deleted

## 2013-02-14 ENCOUNTER — Encounter: Payer: Self-pay | Admitting: *Deleted

## 2013-02-14 VITALS — BP 142/62 | HR 68 | Ht 64.0 in | Wt 209.0 lb

## 2013-02-14 DIAGNOSIS — G459 Transient cerebral ischemic attack, unspecified: Secondary | ICD-10-CM

## 2013-02-14 DIAGNOSIS — R0602 Shortness of breath: Secondary | ICD-10-CM

## 2013-02-14 DIAGNOSIS — I10 Essential (primary) hypertension: Secondary | ICD-10-CM

## 2013-02-14 DIAGNOSIS — I493 Ventricular premature depolarization: Secondary | ICD-10-CM

## 2013-02-14 DIAGNOSIS — D6859 Other primary thrombophilia: Secondary | ICD-10-CM

## 2013-02-14 DIAGNOSIS — I519 Heart disease, unspecified: Secondary | ICD-10-CM

## 2013-02-14 DIAGNOSIS — Z7901 Long term (current) use of anticoagulants: Secondary | ICD-10-CM

## 2013-02-14 DIAGNOSIS — I4949 Other premature depolarization: Secondary | ICD-10-CM

## 2013-02-14 MED ORDER — ENOXAPARIN SODIUM 100 MG/ML ~~LOC~~ SOLN: 100.0000 mg | Freq: Two times a day (BID) | SUBCUTANEOUS | Status: DC

## 2013-02-14 NOTE — Patient Instructions (Addendum)
6/12- No Coumadin.  6/13- Check INR- If INR >2.0, no Lovenox.  If INR<2.0, Start Lovenox 100mg  in PM 6/14- Lovenox 100mg  in AM and PM, No Coumadin 6/15- Lovenox 100mg  in AM only.  No Coumadin  6/16- Day of Cath.  Dr. [place] will let you know when okay to restart Coumadin AND Lovenox.  When okay to restart, take 7.5mg  ( 1 1/2 tablets of 5mg ) for 2 days then resume previous dose.  Continue Lovenox 100mg  in AM and PM.   6/20- Recheck INR.  Continue Lovenox until INR >2.0.

## 2013-02-15 ENCOUNTER — Telehealth: Payer: Self-pay | Admitting: *Deleted

## 2013-02-15 DIAGNOSIS — Z79899 Other long term (current) drug therapy: Secondary | ICD-10-CM

## 2013-02-15 NOTE — Telephone Encounter (Signed)
Received a call earlier today from [facility] at hospital regarding patients labs. No cbc or pt/inr done on the patient. Tried to call patient multiple times today and left messages to call back. Did not hear back from her. I was able to move the cath on Monday to 10:30 and left message for patient to call first thing Monday morning since she will need STAT labs. Will try to have her go to [place] lab. Orders placed

## 2013-02-18 ENCOUNTER — Encounter: Payer: Self-pay | Admitting: Internal Medicine

## 2013-02-18 ENCOUNTER — Inpatient Hospital Stay (HOSPITAL_BASED_OUTPATIENT_CLINIC_OR_DEPARTMENT_OTHER)
Admission: RE | Admit: 2013-02-18 | Discharge: 2013-02-18 | Disposition: A | Discharge status: Home / Self Care | Payer: Medicare Other | Source: Ambulatory Visit | Attending: Cardiology | Admitting: Cardiology

## 2013-02-18 ENCOUNTER — Encounter (HOSPITAL_BASED_OUTPATIENT_CLINIC_OR_DEPARTMENT_OTHER)
Admission: RE | Disposition: A | Discharge status: Home / Self Care | Payer: Self-pay | Source: Ambulatory Visit | Attending: Cardiology

## 2013-02-18 ENCOUNTER — Other Ambulatory Visit (INDEPENDENT_AMBULATORY_CARE_PROVIDER_SITE_OTHER): Payer: Medicare Other

## 2013-02-18 DIAGNOSIS — I4949 Other premature depolarization: Secondary | ICD-10-CM | POA: Insufficient documentation

## 2013-02-18 DIAGNOSIS — I519 Heart disease, unspecified: Secondary | ICD-10-CM

## 2013-02-18 DIAGNOSIS — R9389 Abnormal findings on diagnostic imaging of other specified body structures: Secondary | ICD-10-CM

## 2013-02-18 DIAGNOSIS — G459 Transient cerebral ischemic attack, unspecified: Secondary | ICD-10-CM

## 2013-02-18 DIAGNOSIS — Z79899 Other long term (current) drug therapy: Secondary | ICD-10-CM

## 2013-02-18 DIAGNOSIS — D6859 Other primary thrombophilia: Secondary | ICD-10-CM | POA: Insufficient documentation

## 2013-02-18 DIAGNOSIS — I10 Essential (primary) hypertension: Secondary | ICD-10-CM | POA: Insufficient documentation

## 2013-02-18 DIAGNOSIS — R42 Dizziness and giddiness: Secondary | ICD-10-CM | POA: Insufficient documentation

## 2013-02-18 DIAGNOSIS — Z7901 Long term (current) use of anticoagulants: Secondary | ICD-10-CM

## 2013-02-18 DIAGNOSIS — E119 Type 2 diabetes mellitus without complications: Secondary | ICD-10-CM | POA: Insufficient documentation

## 2013-02-18 DIAGNOSIS — Z86718 Personal history of other venous thrombosis and embolism: Secondary | ICD-10-CM | POA: Insufficient documentation

## 2013-02-18 HISTORY — PX: CARDIAC CATHETERIZATION: SHX172

## 2013-02-18 LAB — CBC WITH DIFFERENTIAL/PLATELET
Basophils Absolute: 0 10*3/uL (ref 0.0–0.1)
Basophils Relative: 0.6 % (ref 0.0–3.0)
Eosinophils Absolute: 0.1 10*3/uL (ref 0.0–0.7)
Eosinophils Relative: 2.8 % (ref 0.0–5.0)
HCT: 35.6 % — ABNORMAL LOW (ref 36.0–46.0)
Hemoglobin: 11.8 g/dL — ABNORMAL LOW (ref 12.0–15.0)
Lymphocytes Relative: 24.6 % (ref 12.0–46.0)
Lymphs Abs: 1.2 10*3/uL (ref 0.7–4.0)
MCHC: 33.2 g/dL (ref 30.0–36.0)
MCV: 92.7 fl (ref 78.0–100.0)
Monocytes Absolute: 0.6 10*3/uL (ref 0.1–1.0)
Monocytes Relative: 12.9 % — ABNORMAL HIGH (ref 3.0–12.0)
Neutro Abs: 2.8 10*3/uL (ref 1.4–7.7)
Neutrophils Relative %: 59.1 % (ref 43.0–77.0)
Platelets: 224 10*3/uL (ref 150.0–400.0)
RBC: 3.84 Mil/uL — ABNORMAL LOW (ref 3.87–5.11)
RDW: 15 % — ABNORMAL HIGH (ref 11.5–14.6)
WBC: 4.8 10*3/uL (ref 4.5–10.5)

## 2013-02-18 LAB — PROTIME-INR
INR: 1.3 ratio — ABNORMAL HIGH (ref 0.8–1.0)
Prothrombin Time: 13.2 s — ABNORMAL HIGH (ref 10.2–12.4)

## 2013-02-18 SURGERY — JV LEFT HEART CATHETERIZATION WITH CORONARY ANGIOGRAM
Anesthesia: Moderate Sedation

## 2013-02-18 MED ORDER — SODIUM CHLORIDE 0.9 % IJ SOLN: 3.0000 mL | Freq: Two times a day (BID) | INTRAMUSCULAR | Status: DC

## 2013-02-18 MED ORDER — SODIUM CHLORIDE 0.9 % IV SOLN: 1.0000 mL/kg/h | INTRAVENOUS | Status: DC

## 2013-02-18 MED ORDER — SODIUM CHLORIDE 0.9 % IV SOLN
250.0000 mL | INTRAVENOUS | Status: DC | PRN
  Administered 2013-02-18: 250 mL via INTRAVENOUS

## 2013-02-18 MED ORDER — SODIUM CHLORIDE 0.9 % IJ SOLN: 3.0000 mL | INTRAMUSCULAR | Status: DC | PRN

## 2013-02-18 MED ORDER — ONDANSETRON HCL 4 MG/2ML IJ SOLN: 4.0000 mg | Freq: Four times a day (QID) | INTRAMUSCULAR | Status: DC | PRN

## 2013-02-18 MED ORDER — ACETAMINOPHEN 325 MG PO TABS: 650.0000 mg | ORAL_TABLET | ORAL | Status: DC | PRN

## 2013-02-18 MED ORDER — ENOXAPARIN SODIUM 100 MG/ML ~~LOC~~ SOLN
100.0000 mg | Freq: Two times a day (BID) | SUBCUTANEOUS | Status: DC
Start: 2013-02-19 — End: 2013-03-05

## 2013-02-18 MED ORDER — ASPIRIN 81 MG PO CHEW
324.0000 mg | CHEWABLE_TABLET | ORAL | Status: AC
  Administered 2013-02-18: 324 mg via ORAL

## 2013-02-18 NOTE — Progress Notes (Signed)
Pt took her lunch time meds.  Metformin on hold.

## 2013-02-18 NOTE — Interval H&P Note (Signed)
History and Physical Interval Note:  02/18/2013 12:41 PM  Pamela [name]  has presented today for surgery, with the diagnosis of LV dysfunction-new.  The various methods of treatment have been discussed with the patient and family. After consideration of risks, benefits and other options for treatment, the patient has consented to  Procedure(s): JV LEFT HEART CATHETERIZATION WITH CORONARY ANGIOGRAM (N/A) as a surgical intervention .  The patient's history has been reviewed, patient examined, no change in status, stable for surgery.  I have reviewed the patient's chart and labs.  Questions were answered to the patient's satisfaction.     Theron Arista [facility] 02/18/2013 12:41 PM

## 2013-02-18 NOTE — Progress Notes (Signed)
Bedrest begins @ 1315, tegaderm dressing applied to right groin site by Theodoro Grist, site level 0.

## 2013-02-18 NOTE — H&P (View-Only) (Signed)
PCP:  Lucila Maine, MD  The patient presents today for routine electrophysiology followup.  She reports that her PVCs have been more bothersome over the past month or so.  She also reports postural dizziness for 2-3 months.  This temporally corresponds to starting spironolactone.  In addition, she has vertiginous spinning sensation at night when in bed if she turns her head quickly.  Today, she denies symptoms of  chest pain, shortness of breath, presyncope, or syncope.  She has stable mild pedal edema.  The patient feels that she is tolerating medications without difficulties and is otherwise without complaint today.   Past Medical History  Diagnosis Date  . HTN (hypertension)   . GERD (gastroesophageal reflux disease)   . Hypercholesteremia   . Sinusitis   . Diabetes mellitus   . Obesity   . DJD (degenerative joint disease)   . PVC's (premature ventricular contractions)   . Protein S deficiency     prior DVT L knee, L arm DVT,  followed by Dr Kathrin Ruddy at [facility]   Past Surgical History  Procedure Laterality Date  . Tonsillectomy    . Total abdominal hysterectomy  1988  . Cholecystectomy  1993  . Left knee replacement  2006  . Carpal tunnel release  1994    bilateral    Current Outpatient Prescriptions  Medication Sig Dispense Refill  . amLODipine (NORVASC) 5 MG tablet Take 2.5 mg by mouth daily.      Marland Kitchen aspirin 81 MG tablet Take 81 mg by mouth daily.      Marland Kitchen atorvastatin (LIPITOR) 20 MG tablet Take 20 mg by mouth daily.      . carvedilol (COREG) 12.5 MG tablet Take 12.5 mg by mouth 2 (two) times daily with a meal.        . Cinnamon 500 MG capsule Take 1,000 mg by mouth daily.       . fish oil-omega-3 fatty acids 1000 MG capsule Take 2 g by mouth daily.      . Multiple Vitamins-Minerals (ONE-A-DAY WOMENS 50 PLUS PO) Take 1 tablet by mouth daily.        Marland Kitchen omeprazole-sodium bicarbonate (ZEGERID) 40-1100 MG per capsule Take 1 capsule by mouth daily before breakfast.      .  pioglitazone-metformin (ACTOPLUS MET) 15-850 MG per tablet Take 1 tablet by mouth 2 (two) times daily with a meal.        . polycarbophil (FIBERCON) 625 MG tablet Take 625 mg by mouth daily.        . repaglinide (PRANDIN) 2 MG tablet Take 4 mg by mouth 3 (three) times daily before meals.       Marland Kitchen spironolactone (ALDACTONE) 25 MG tablet Take 25 mg by mouth daily.      . traZODone (DESYREL) 50 MG tablet Take 50 mg by mouth 2 (two) times daily.        . valsartan (DIOVAN) 160 MG tablet Take 160 mg by mouth 2 (two) times daily.        Marland Kitchen warfarin (COUMADIN) 1 MG tablet Take 1 mg by mouth as directed.      . warfarin (COUMADIN) 5 MG tablet Take 5 mg by mouth daily.      . meclizine (ANTIVERT) 12.5 MG tablet Take 1 tablet (12.5 mg total) by mouth 3 (three) times daily as needed.  30 tablet  0   No current facility-administered medications for this visit.    Allergies  Allergen Reactions  . Amoxapine And Related  Hives  . Darvon Nausea And Vomiting  . Ketek (Telithromycin)   . Naproxen Hives    History   Social History  . Marital Status: Married    Spouse Name: N/A    Number of Children: N/A  . Years of Education: N/A   Occupational History  . Not on file.   Social History Main Topics  . Smoking status: Never Smoker   . Smokeless tobacco: Not on file  . Alcohol Use: No  . Drug Use: No  . Sexually Active: Not on file   Other Topics Concern  . Not on file   Social History Narrative   Lives with spouse in [place].  2 grown daughters.   Retired Print production planner      Family History  Problem Relation Age of Onset  . Cirrhosis Mother   . Coronary artery disease Father   . Protein S deficiency Sister     prior DVT  . Antithrombin III deficiency Mother     multiple emboli  . Protein S deficiency Daughter     Physical Exam: Filed Vitals:   01/21/13 1153  BP: 142/71  Pulse: 63  Height: 5\' 4"  (1.626 m)  Weight: 206 lb 12.8 oz (93.804 kg)    GEN- The patient is well  appearing, alert and oriented x 3 today.   Head- normocephalic, atraumatic Eyes-  Sclera clear, conjunctiva pink Ears- hearing intact Oropharynx- clear Neck- supple, no JVP Lymph- no cervical lymphadenopathy Lungs- Clear to ausculation bilaterally, normal work of breathing Heart- Regular rate and rhythm, no murmurs, rubs or gallops, PMI not laterally displaced GI- soft, NT, ND, + BS Extremities- no clubbing, cyanosis, trace ankle edema  ekg today  Sinus rhythm 77 bpm with LBB L superior axis pvcs  Assessment and Plan:  1. PVCs More symptomatic recently Check electrolytes and TFTs Repeat echo  2. Postural dizziness Corresponds to spironolactone We may have to stop this medicine bmet today Adequate hydration and rising slowly when changing position is advised  3. Vertigo Meclizine prn  4. HTN Stable bmet Consider stopping spironolactone if symptoms are not improved upon return  Return in 2 months for further assessment

## 2013-02-18 NOTE — CV Procedure (Signed)
   Cardiac Catheterization Procedure Note  Name: Pamela [name] MRN: [id number] DOB: [birth year]  Procedure: Left Heart Cath, Selective Coronary Angiography, LV angiography  Indication: 67 yo WF with history of PVCs. Recent Echo showed severe LV dysfunction with EF 30-35%.   Procedural details: The right groin was prepped, draped, and anesthetized with 1% lidocaine. Using modified Seldinger technique, a 5 French sheath was introduced into the right femoral artery. Standard Judkins catheters were used for coronary angiography and left ventriculography. Catheter exchanges were performed over a guidewire. There were no immediate procedural complications. The patient was transferred to the post catheterization recovery area for further monitoring.  Procedural Findings: Hemodynamics:  AO 194/75 mean 134 mm Hg LV 194/23 mm Hg   Coronary angiography: Coronary dominance: right  Left mainstem: Normal  Left anterior descending (LAD): normal  Left circumflex (LCx): normal  Right coronary artery (RCA): normal.  Left ventriculography: Left ventricular systolic function is low normal, LVEF is estimated at 50%, there is no significant mitral regurgitation   Final Conclusions:   1. Normal coronary anatomy 2. Low normal LV function. EF 50%.  Recommendations: medical therapy. With history of Protein S deficiency will resume Lovenox in am until Coumadin therapeutic.  Theron Arista [facility] 02/18/2013, 1:07 PM

## 2013-02-18 NOTE — Progress Notes (Signed)
Allen's test performed on right hand with positive results.

## 2013-02-23 ENCOUNTER — Telehealth: Payer: Self-pay | Admitting: Physician Assistant

## 2013-02-23 NOTE — Telephone Encounter (Addendum)
Patient called because she had some concerns about her cath site. She denied any hematoma or swelling but had developed some ecchymosis over the last couple of days. The area is not painful but is tender to palpation.  Advised her if there is any swelling, bleeding or drainage, she needs to come to the emergency room. A small amount of ecchymosis is acceptable but this should not continue to spread and should begin to resolve in a day or 2. Advised her to please call back if she had any concerns but right now, do not see any need for her to come to the emergency room. Requested that she keep her appointment on July 1 with Pamela [name].  [name] also advised that she had some questions regarding her palpitations and wanted to get a copy of her cath report. I advised her she would need to call the office on Monday morning as I am not familiar with the policies on giving patient's copies of their procedures. I am sure Ms Pamela [name] will be happy to review it with her when she comes in.  Patient had no other concerns.

## 2013-02-25 ENCOUNTER — Telehealth: Payer: Self-pay | Admitting: Cardiology

## 2013-02-25 NOTE — Telephone Encounter (Signed)
Pt signed ROI In Office, Picked up copy of Cardiac Cath 02/25/13/KM

## 2013-03-03 DIAGNOSIS — Z7901 Long term (current) use of anticoagulants: Secondary | ICD-10-CM

## 2013-03-03 DIAGNOSIS — D6859 Other primary thrombophilia: Secondary | ICD-10-CM | POA: Insufficient documentation

## 2013-03-03 DIAGNOSIS — R0609 Other forms of dyspnea: Secondary | ICD-10-CM | POA: Insufficient documentation

## 2013-03-03 DIAGNOSIS — R0600 Dyspnea, unspecified: Secondary | ICD-10-CM | POA: Insufficient documentation

## 2013-03-03 HISTORY — DX: Long term (current) use of anticoagulants: Z79.01

## 2013-03-03 HISTORY — DX: Dyspnea, unspecified: R06.00

## 2013-03-03 NOTE — Progress Notes (Signed)
PCP:  Lucila Maine, MD  The patient presents today for routine electrophysiology followup.  She reports that her PVCs have been more bothersome over the past month or so.  Her dizziness has improved.  She reports occasional chest tightness and SOB.   Today, she denies symptoms of presyncope, or syncope.  She has stable mild pedal edema.  The patient feels that she is tolerating medications without difficulties and is otherwise without complaint today.   Past Medical History  Diagnosis Date  . HTN (hypertension)   . GERD (gastroesophageal reflux disease)   . Hypercholesteremia   . Sinusitis   . Diabetes mellitus   . Obesity   . DJD (degenerative joint disease)   . PVC's (premature ventricular contractions)   . Protein S deficiency     prior DVT L knee, L arm DVT,  followed by Dr Kathrin [facility] at [facility]  . TIA (transient ischemic attack)    Past Surgical History  Procedure Laterality Date  . Tonsillectomy    . Total abdominal hysterectomy  1988  . Cholecystectomy  1993  . Left knee replacement  2006  . Carpal tunnel release  1994    bilateral    Current Outpatient Prescriptions  Medication Sig Dispense Refill  . amLODipine (NORVASC) 5 MG tablet Take 2.5 mg by mouth daily.      Marland Kitchen aspirin 81 MG tablet Take 81 mg by mouth daily.      Marland Kitchen atorvastatin (LIPITOR) 20 MG tablet Take 20 mg by mouth daily.      . carvedilol (COREG) 12.5 MG tablet Take 12.5 mg by mouth 2 (two) times daily with a meal.        . Cinnamon 500 MG capsule Take 1,000 mg by mouth daily.       . fish oil-omega-3 fatty acids 1000 MG capsule Take 2 g by mouth daily.      . magnesium oxide (MAG-OX) 400 MG tablet Take 1 tablet (400 mg total) by mouth 2 (two) times daily.  60 tablet  3  . meclizine (ANTIVERT) 12.5 MG tablet Take 1 tablet (12.5 mg total) by mouth 3 (three) times daily as needed.  30 tablet  0  . Multiple Vitamins-Minerals (ICAPS MV PO) Take by mouth daily.      Marland Kitchen omeprazole-sodium bicarbonate (ZEGERID) 40-1100  MG per capsule Take 1 capsule by mouth daily before breakfast.      . pioglitazone-metformin (ACTOPLUS MET) 15-850 MG per tablet Take 1 tablet by mouth 2 (two) times daily with a meal.        . polycarbophil (FIBERCON) 625 MG tablet Take 625 mg by mouth daily.        . repaglinide (PRANDIN) 2 MG tablet Take 4 mg by mouth 3 (three) times daily before meals.       Marland Kitchen spironolactone (ALDACTONE) 25 MG tablet Take 25 mg by mouth daily.      . traZODone (DESYREL) 50 MG tablet Take 50 mg by mouth 2 (two) times daily.        . valsartan (DIOVAN) 160 MG tablet Take 160 mg by mouth 2 (two) times daily.        Marland Kitchen warfarin (COUMADIN) 1 MG tablet Take 1 mg by mouth as directed.      . warfarin (COUMADIN) 5 MG tablet Take 5 mg by mouth daily.      Marland Kitchen enoxaparin (LOVENOX) 100 MG/ML injection Inject 1 mL (100 mg total) into the skin every 12 (twelve) hours.  10  Syringe  1   No current facility-administered medications for this visit.    Allergies  Allergen Reactions  . Amoxapine And Related Hives  . Darvon Nausea And Vomiting  . Ketek (Telithromycin)   . Naproxen Hives    History   Social History  . Marital Status: Married    Spouse Name: N/A    Number of Children: N/A  . Years of Education: N/A   Occupational History  . Not on file.   Social History Main Topics  . Smoking status: Never Smoker   . Smokeless tobacco: Not on file  . Alcohol Use: No  . Drug Use: No  . Sexually Active: Not on file   Other Topics Concern  . Not on file   Social History Narrative   Lives with spouse in [place].  2 grown daughters.   Retired Print production planner      Family History  Problem Relation Age of Onset  . Cirrhosis Mother   . Coronary artery disease Father   . Protein S deficiency Sister     prior DVT  . Antithrombin III deficiency Mother     multiple emboli  . Protein S deficiency Daughter     Physical Exam: Filed Vitals:   02/14/13 1129  BP: 142/62  Pulse: 68  Height: 5\' 4"  (1.626 m)   Weight: 209 lb (94.802 kg)    GEN- The patient is well appearing, alert and oriented x 3 today.   Head- normocephalic, atraumatic Eyes-  Sclera clear, conjunctiva pink Ears- hearing intact Oropharynx- clear Neck- supple, no JVP Lymph- no cervical lymphadenopathy Lungs- Clear to ausculation bilaterally, normal work of breathing Heart- Regular rate and rhythm, no murmurs, rubs or gallops, PMI not laterally displaced GI- soft, NT, ND, + BS Extremities- no clubbing, cyanosis, trace ankle edema  ekg today  Sinus rhythm 68 bpm  Recent echo reviewed  Assessment and Plan:  1. PVCs More symptomatic recently Echo 02/06/13 reveals EF 30-35%.  I am concerned that she may have underlying CAD,particularly with CP and SOB.  I would therefore recommend cath at this time. RIsks, benefits, and alternatives to the procedure were discussed at length with the patient today.  We will proceed with cath.  2. CP/ SOB As above  3. HTN Stable  4. Protein S deficiency Will need lovenox bridge while off of coumadin for cath

## 2013-03-05 ENCOUNTER — Encounter: Payer: Self-pay | Admitting: Nurse Practitioner

## 2013-03-05 ENCOUNTER — Encounter (INDEPENDENT_AMBULATORY_CARE_PROVIDER_SITE_OTHER): Payer: Medicare Other

## 2013-03-05 ENCOUNTER — Encounter: Payer: Self-pay | Admitting: *Deleted

## 2013-03-05 ENCOUNTER — Ambulatory Visit (INDEPENDENT_AMBULATORY_CARE_PROVIDER_SITE_OTHER): Payer: Medicare Other | Admitting: Nurse Practitioner

## 2013-03-05 VITALS — BP 140/68 | HR 72 | Ht 64.0 in | Wt 208.4 lb

## 2013-03-05 DIAGNOSIS — I4949 Other premature depolarization: Secondary | ICD-10-CM

## 2013-03-05 DIAGNOSIS — I493 Ventricular premature depolarization: Secondary | ICD-10-CM

## 2013-03-05 NOTE — Patient Instructions (Addendum)
Stay on your current medicines  We are going to place a monitor for 24 hours to look at your PVCs  I will get Dr. Johney Frame to review and we will be in touch regarding your next visit.   Call the [facility] office at [phone] if you have any questions, problems or concerns.

## 2013-03-05 NOTE — Progress Notes (Signed)
Patient ID: Pamela [name], female   DOB: February 21, 1946, 67 y.o.   MRN: [id number] E-Cardio 24 Hour Holter Monitor applied to patient.

## 2013-03-05 NOTE — Progress Notes (Addendum)
Brown Human Date of Birth: [birth year] Medical Record #[id number]  History of Present Illness: Pamela [name] is seen back today for a post cath visit. Seen for Dr. Johney Frame. Has HTN, GERD, protein S deficiency, on coumadin, PVCs, HLD, morbid obesity and DM.   Recently referred for cardiac cath due to chest pain and back pain. Was also having symptomatic PVCs. EF by echo was 30 to 35%. Cath was done to rule out CAD. This was normal. EF was low normal at 50%.   Comes back today. Here alone. Doing ok. Still having the PVCs that are quite bothersome for her. Sometimes feels presyncopal. Says she is suppose to be getting a monitor put on. She is taking her medicines. Remains on Coreg. No frank syncope. Groin has gotten ok. She is quite bruised. She does home monitoring for her coumadin. Now with an INR above 2 and off of Lovenox.   Current Outpatient Prescriptions  Medication Sig Dispense Refill  . amLODipine (NORVASC) 5 MG tablet Take 2.5 mg by mouth daily.      Marland Kitchen aspirin 81 MG tablet Take 81 mg by mouth daily.      Marland Kitchen atorvastatin (LIPITOR) 20 MG tablet Take 20 mg by mouth daily.      Marland Kitchen azelastine (ASTELIN) 137 MCG/SPRAY nasal spray Place 2 sprays into the nose as needed.       . carvedilol (COREG) 12.5 MG tablet Take 12.5 mg by mouth 2 (two) times daily with a meal.        . Cinnamon 500 MG capsule Take 1,000 mg by mouth daily.       . fish oil-omega-3 fatty acids 1000 MG capsule Take 2 g by mouth daily.      . magnesium oxide (MAG-OX) 400 MG tablet Take 1 tablet (400 mg total) by mouth 2 (two) times daily.  60 tablet  3  . Multiple Vitamins-Minerals (ICAPS MV PO) Take by mouth daily.      Marland Kitchen omeprazole-sodium bicarbonate (ZEGERID) 40-1100 MG per capsule Take 1 capsule by mouth daily before breakfast.      . pioglitazone-metformin (ACTOPLUS MET) 15-850 MG per tablet Take 1 tablet by mouth 2 (two) times daily with a meal.        . polycarbophil (FIBERCON) 625 MG tablet Take 625 mg by mouth daily.         . repaglinide (PRANDIN) 2 MG tablet Take 4 mg by mouth 3 (three) times daily before meals.       Marland Kitchen spironolactone (ALDACTONE) 25 MG tablet Take 25 mg by mouth daily.      . traZODone (DESYREL) 50 MG tablet Take 50 mg by mouth 2 (two) times daily.        . valsartan (DIOVAN) 160 MG tablet Take 160 mg by mouth 2 (two) times daily.        Marland Kitchen warfarin (COUMADIN) 1 MG tablet Take 1 mg by mouth as directed.      . warfarin (COUMADIN) 5 MG tablet Take 5 mg by mouth daily.      . meclizine (ANTIVERT) 12.5 MG tablet Take 1 tablet (12.5 mg total) by mouth 3 (three) times daily as needed.  30 tablet  0   No current facility-administered medications for this visit.    Allergies  Allergen Reactions  . Amoxapine And Related Hives  . Darvon Nausea And Vomiting  . Ketek (Telithromycin)   . Naproxen Hives    Past Medical History  Diagnosis Date  . HTN (  hypertension)   . GERD (gastroesophageal reflux disease)   . Hypercholesteremia   . Sinusitis   . Diabetes mellitus   . Obesity   . DJD (degenerative joint disease)   . PVC's (premature ventricular contractions)   . Protein S deficiency     prior DVT L knee, L arm DVT,  follow[facility] by Dr Kathrin Ruddy at [facility]  . TIA (transient ischemic attack)   . S/P cardiac catheterization 02/2013    Normal coronaries; low normal EF at 50%  . Chronic anticoagulation     Past Surgical History  Procedure Laterality Date  . Tonsillectomy    . Total abdominal hysterectomy  1988  . Cholecystectomy  1993  . Left knee replacement  2006  . Carpal tunnel release  1994    bilateral    History  Smoking status  . Never Smoker   Smokeless tobacco  . Not on file    History  Alcohol Use No    Family History  Problem Relation Age of Onset  . Cirrhosis Mother   . Coronary artery disease Father   . Protein S deficiency Sister     prior DVT  . Antithrombin III deficiency Mother     multiple emboli  . Protein S deficiency Daughter     Review of  Systems: The review of systems is per the HPI.  All other systems were reviewed and are negative.  Physical Exam: BP 140/68  Pulse 72  Ht 5\' 4"  (1.626 m)  Wt 208 lb 6.4 oz (94.53 kg)  BMI 35.75 kg/m2 Patient is very pleasant and in no acute distress. She is obese. Skin is warm and dry. Color is normal.  HEENT is unremarkable. Normocephalic/atraumatic. PERRL. Sclera are nonicteric. Neck is supple. No masses. No JVD. Lungs are clear. Cardiac exam shows a regular rate and rhythm. Abdomen is soft. Diffuse but resolving bruising noted. Right groin is ok. No hematoma noted.  Extremities are without edema. it and ROM are intact. No gross neurologic deficits noted.  LABORATORY DATA:  Lab Results  Component Value Date   WBC 4.8 02/18/2013   HGB 11.8* 02/18/2013   HCT 35.6* 02/18/2013   PLT 224.0 02/18/2013   GLUCOSE 122* 02/06/2013   NA 133* 02/06/2013   K 4.5 02/06/2013   CL 98 02/06/2013   CREATININE 0.8 02/06/2013   BUN 14 02/06/2013   CO2 27 02/06/2013   INR 1.3* 02/18/2013    Coronary angiography:  Left mainstem: Normal  Left anterior descending (LAD): normal  Left circumflex (LCx): normal  Right coronary artery (RCA): normal.   Left ventriculography: Left ventricular systolic function is low normal, LVEF is estimated at 50%, there is no significant mitral regurgitation   Final Conclusions:  1. Normal coronary anatomy  2. Low normal LV function. EF 50%.   Recommendations: medical therapy. With history of Protein S deficiency will resume Lovenox in am until Coumadin therapeutic.  Pamela [name] [facility]  02/18/2013, 1:07 PM   Assessment / Plan: 1. S/P cardiac cath - this showed normal coronaries. Her chest pain is not felt to be cardiac.   2. Low normal EF - EF is better by cath. Will follow.   3.  PVC's - chronic - will place Holter today to further assess. Maybe she would be a candidate for other drug therapy. Will defer to Dr. Johney Frame.   Holter is placed today. Further disposition to  follow. Will have Dr. Johney Frame review.   Patient is agreeable to this plan and will  call if any problems develop in the interim.   Pamela Macadamia, RN, ANP-C [facility] [address] [place], [place]  [address]

## 2013-03-11 ENCOUNTER — Ambulatory Visit: Payer: Medicare Other | Admitting: Internal Medicine

## 2013-03-11 ENCOUNTER — Encounter: Payer: Self-pay | Admitting: Internal Medicine

## 2013-04-10 ENCOUNTER — Other Ambulatory Visit: Payer: Self-pay

## 2013-05-13 ENCOUNTER — Other Ambulatory Visit (HOSPITAL_COMMUNITY): Payer: Self-pay | Admitting: Internal Medicine

## 2013-07-11 ENCOUNTER — Other Ambulatory Visit: Payer: Self-pay

## 2013-07-30 DIAGNOSIS — I82409 Acute embolism and thrombosis of unspecified deep veins of unspecified lower extremity: Secondary | ICD-10-CM | POA: Insufficient documentation

## 2013-07-30 DIAGNOSIS — G459 Transient cerebral ischemic attack, unspecified: Secondary | ICD-10-CM | POA: Insufficient documentation

## 2013-07-30 HISTORY — DX: Acute embolism and thrombosis of unspecified deep veins of unspecified lower extremity: I82.409

## 2013-07-30 HISTORY — DX: Transient cerebral ischemic attack, unspecified: G45.9

## 2013-09-02 ENCOUNTER — Other Ambulatory Visit (HOSPITAL_COMMUNITY): Payer: Self-pay | Admitting: Internal Medicine

## 2013-10-03 ENCOUNTER — Other Ambulatory Visit (HOSPITAL_COMMUNITY): Payer: Self-pay | Admitting: Internal Medicine

## 2013-11-09 ENCOUNTER — Other Ambulatory Visit (HOSPITAL_COMMUNITY): Payer: Self-pay | Admitting: Internal Medicine

## 2014-04-04 ENCOUNTER — Other Ambulatory Visit (HOSPITAL_COMMUNITY): Payer: Self-pay | Admitting: Internal Medicine

## 2014-05-22 ENCOUNTER — Ambulatory Visit (HOSPITAL_COMMUNITY)
Admission: RE | Admit: 2014-05-22 | Discharge: 2014-05-22 | Disposition: A | Discharge status: Home / Self Care | Payer: Medicare Other | Source: Ambulatory Visit | Attending: Internal Medicine | Admitting: Internal Medicine

## 2014-05-22 ENCOUNTER — Other Ambulatory Visit: Payer: Self-pay | Admitting: *Deleted

## 2014-05-22 DIAGNOSIS — M7989 Other specified soft tissue disorders: Secondary | ICD-10-CM | POA: Diagnosis present

## 2014-05-22 DIAGNOSIS — M79609 Pain in unspecified limb: Secondary | ICD-10-CM | POA: Insufficient documentation

## 2014-05-22 DIAGNOSIS — R609 Edema, unspecified: Secondary | ICD-10-CM

## 2014-05-22 DIAGNOSIS — R52 Pain, unspecified: Secondary | ICD-10-CM

## 2014-05-22 NOTE — Progress Notes (Signed)
Right Lower Extremity Venous Duplex Completed. No evidence for DVT or SVT. °Brianna L Mazza,RVT °

## 2014-05-23 ENCOUNTER — Telehealth (HOSPITAL_COMMUNITY): Payer: Self-pay | Admitting: *Deleted

## 2014-10-01 ENCOUNTER — Ambulatory Visit (INDEPENDENT_AMBULATORY_CARE_PROVIDER_SITE_OTHER): Payer: PPO | Admitting: Pharmacist Clinician (PhC)/ Clinical Pharmacy Specialist

## 2014-10-01 DIAGNOSIS — I4891 Unspecified atrial fibrillation: Secondary | ICD-10-CM

## 2014-10-01 DIAGNOSIS — D6859 Other primary thrombophilia: Secondary | ICD-10-CM | POA: Diagnosis not present

## 2014-10-01 DIAGNOSIS — I480 Paroxysmal atrial fibrillation: Secondary | ICD-10-CM

## 2014-10-01 HISTORY — DX: Unspecified atrial fibrillation: I48.91

## 2014-10-01 HISTORY — DX: Paroxysmal atrial fibrillation: I48.0

## 2014-10-01 LAB — POCT INR: INR: 1

## 2014-12-17 ENCOUNTER — Other Ambulatory Visit: Payer: Self-pay

## 2014-12-17 ENCOUNTER — Ambulatory Visit (INDEPENDENT_AMBULATORY_CARE_PROVIDER_SITE_OTHER): Payer: PPO | Admitting: Internal Medicine

## 2014-12-17 ENCOUNTER — Encounter: Payer: Self-pay | Admitting: Internal Medicine

## 2014-12-17 VITALS — BP 146/82 | HR 72 | Ht 64.0 in | Wt 177.6 lb

## 2014-12-17 DIAGNOSIS — I493 Ventricular premature depolarization: Secondary | ICD-10-CM

## 2014-12-17 DIAGNOSIS — R0602 Shortness of breath: Secondary | ICD-10-CM

## 2014-12-17 DIAGNOSIS — I10 Essential (primary) hypertension: Secondary | ICD-10-CM | POA: Diagnosis not present

## 2014-12-17 MED ORDER — CARVEDILOL 6.25 MG PO TABS: 6.2500 mg | ORAL_TABLET | Freq: Two times a day (BID) | ORAL | Status: DC

## 2014-12-17 NOTE — Patient Instructions (Signed)
Medication Instructions: - start Coreg (carvedilol) 6.25 mg one tablet by mouth twice daily  Labwork: - none  Procedures/Testing: - Your physician has requested that you have an echocardiogram. Echocardiography is a painless test that uses sound waves to create images of your heart. It provides your doctor with information about the size and shape of your heart and how well your heart's chambers and valves are working. This procedure takes approximately one hour. There are no restrictions for this procedure.  Follow-Up: Your physician recommends that you schedule a follow-up appointment in: 6 week with Chanetta Marshall, NP  Any Additional Special Instructions Will Be Listed Below (If Applicable). - none

## 2014-12-17 NOTE — Progress Notes (Signed)
Electrophysiology Office Note   Date:  12/17/2014   ID:  LILIANI BOBO, DOB 09-10-1945, MRN [id number]  PCP:  Ann Held, MD   Primary Electrophysiologist: Thompson Grayer, MD    Chief Complaint  Patient presents with  . Appointment    TIA & PVCs     History of Present Illness: Pamela [name] is a 69 y.o. female who presents today for electrophysiology evaluation.   I have not seen her for quite some time.  She seems to be doing reasonably well.  She was on coreg for reduced EF and PVCs but is no longer.  She cannot tell me why/ when it was stopped.  She has occasional palpitations.  She also has SOB.   Today, she denies symptoms of  chest pain, orthopnea, PND, lower extremity edema, claudication, dizziness, presyncope, syncope, bleeding, or neurologic sequela. The patient is tolerating medications without difficulties and is otherwise without complaint today.    Past Medical History  Diagnosis Date  . HTN (hypertension)   . GERD (gastroesophageal reflux disease)   . Hypercholesteremia   . Sinusitis   . Diabetes mellitus   . Obesity   . DJD (degenerative joint disease)   . PVC's (premature ventricular contractions)   . Protein S deficiency     prior DVT L knee, L arm DVT,  followed by Dr West Pugh at [facility]  . TIA (transient ischemic attack)   . S/P cardiac catheterization 02/2013    Normal coronaries; low normal EF at 50%  . Chronic anticoagulation    Past Surgical History  Procedure Laterality Date  . Tonsillectomy    . Total abdominal hysterectomy  1988  . Cholecystectomy  1993  . Left knee replacement  2006  . Carpal tunnel release  1994    bilateral     Current Outpatient Prescriptions  Medication Sig Dispense Refill  . aspirin 81 MG tablet Take 81 mg by mouth daily.    Marland Kitchen atorvastatin (LIPITOR) 20 MG tablet Take 20 mg by mouth daily.    Marland Kitchen azelastine (ASTELIN) 137 MCG/SPRAY nasal spray Place 2 sprays into the nose daily as needed for rhinitis or allergies.      . Cinnamon 500 MG capsule Take 1,000 mg by mouth daily.     . Exenatide (BYDUREON [place]) Inject 2 mg into the skin once a week. On Wednesdays    . fish oil-omega-3 fatty acids 1000 MG capsule Take 2 g by mouth daily.    . magnesium oxide (MAG-OX) 400 (241.3 MG) MG tablet TAKE 1 TABLET TWICE DAILY (Patient taking differently: TAKE 1 TABLET BY MOUTH TWICE DAILY) 60 tablet 1  . metFORMIN (GLUCOPHAGE) 1000 MG tablet Take 1,000 mg by mouth 3 (three) times daily.    . Multiple Vitamins-Minerals (ICAPS MV PO) Take 1 capsule by mouth daily.     . pantoprazole (PROTONIX) 40 MG tablet Take 40 mg by mouth daily.  5  . polycarbophil (FIBERCON) 625 MG tablet Take 625 mg by mouth daily.      . repaglinide (PRANDIN) 2 MG tablet Take 4 mg by mouth 3 (three) times daily before meals.     Marland Kitchen spironolactone (ALDACTONE) 25 MG tablet Take 25 mg by mouth daily.    . traZODone (DESYREL) 50 MG tablet Take 100 mg by mouth at bedtime.     . valsartan (DIOVAN) 160 MG tablet Take 160 mg by mouth 2 (two) times daily.      Marland Kitchen warfarin (COUMADIN) 1 MG tablet  Take 1 mg by mouth as directed.    . warfarin (COUMADIN) 5 MG tablet Take 5 mg by mouth daily.     No current facility-administered medications for this visit.    Allergies:   Ketek; Naproxen sodium; Amoxapine and related; Darvon; Amoxicillin; and Propoxyphene   Social History:  The patient  reports that she has never smoked. She does not have any smokeless tobacco history on file. She reports that she does not drink alcohol or use illicit drugs.   Family History:  The patient's  family history includes Antithrombin III deficiency in her mother; Cirrhosis in her mother; Coronary artery disease in her father; Protein S deficiency in her daughter and sister.    ROS:  Please see the history of present illness.   All other systems are reviewed and negative.    PHYSICAL EXAM: VS:  BP 146/82 mmHg  Pulse 72  Ht 5\' 4"  (1.626 m)  Wt 177 lb 9.6 oz (80.559 kg)  BMI 30.47  kg/m2 , BMI Body mass index is 30.47 kg/(m^2). GEN: Well nourished, well developed, in no acute distress HEENT: normal Neck: no JVD, carotid bruits, or masses Cardiac: RRR with ectopy; no murmurs, rubs, or gallops,no edema  Respiratory:  clear to auscultation bilaterally, normal work of breathing GI: soft, nontender, nondistended, + BS MS: no deformity or atrophy Skin: warm and dry  Neuro:  Strength and sensation are intact Psych: euthymic mood, full affect  EKG:  EKG is ordered today. The ekg ordered today shows sinus rhythm with PVCs   Recent Labs: No results found for requested labs within last 365 days.    Lipid Panel  No results found for: CHOL, TRIG, HDL, CHOLHDL, VLDL, LDLCALC, LDLDIRECT   Wt Readings from Last 3 Encounters:  12/17/14 177 lb 9.6 oz (80.559 kg)  03/05/13 208 lb 6.4 oz (94.53 kg)  02/18/13 209 lb (94.802 kg)     ASSESSMENT AND PLAN:  1.  PVCs LBB inferior axis PVCs, likely arising from the outflow tract Restart  Coreg 6.25mg  BID today Repeat echo IF EF has improved and she has ongoing PVCs could consider addition of flecainide 50mg  BID in the future  2. Previous reduced EF By cath, EF was 50% Continue current medicines, add coreg Repeat echo  3. HTN Above goal Add coreg as above  4. ? AFib I do not see afib documented Prior holter reveals pacs and nonsustained atach She requires long term anticoagulation for protein S deficiency  Current medicines are reviewed at length with the patient today.   The patient does not have concerns regarding her medicines.  The following changes were made today:  none  Follow-up: return to see EP NP in 6 weeks   Signed, Thompson Grayer, MD  12/17/2014 3:21 PM     [address] [place] [address] [phone] (office) [phone] (fax)

## 2014-12-19 ENCOUNTER — Ambulatory Visit (HOSPITAL_COMMUNITY): Payer: PPO | Attending: Cardiovascular Disease

## 2014-12-19 DIAGNOSIS — R0600 Dyspnea, unspecified: Secondary | ICD-10-CM

## 2014-12-19 DIAGNOSIS — R0602 Shortness of breath: Secondary | ICD-10-CM

## 2014-12-19 NOTE — Progress Notes (Signed)
2D Echo completed. 12/19/2014

## 2014-12-26 ENCOUNTER — Telehealth: Payer: Self-pay | Admitting: Internal Medicine

## 2014-12-26 NOTE — Telephone Encounter (Addendum)
Patient aware and will keep her follow up as scheduled

## 2014-12-26 NOTE — Telephone Encounter (Signed)
New problem   Pt want to know results of her echocardiogram she had done on 4.15.16.

## 2015-01-05 ENCOUNTER — Ambulatory Visit: Payer: TRICARE For Life (TFL) | Admitting: Internal Medicine

## 2015-01-21 ENCOUNTER — Telehealth: Payer: Self-pay | Admitting: Internal Medicine

## 2015-01-21 NOTE — Telephone Encounter (Signed)
Follow Up   Pt called for her results. ECHO and Lab results

## 2015-01-21 NOTE — Telephone Encounter (Signed)
LMTCB

## 2015-01-26 ENCOUNTER — Ambulatory Visit: Payer: TRICARE For Life (TFL) | Admitting: Nurse Practitioner

## 2015-01-29 NOTE — Telephone Encounter (Signed)
Left message on her machine EF 50-55% which is what her heart cath showed.  I have asked her to keep her appointment on 02/04/15 with Chanetta Marshall, NP

## 2015-02-04 ENCOUNTER — Ambulatory Visit (INDEPENDENT_AMBULATORY_CARE_PROVIDER_SITE_OTHER): Payer: PPO | Admitting: Nurse Practitioner

## 2015-02-04 ENCOUNTER — Encounter: Payer: Self-pay | Admitting: Nurse Practitioner

## 2015-02-04 VITALS — BP 138/70 | HR 70 | Ht 64.0 in | Wt 176.4 lb

## 2015-02-04 DIAGNOSIS — I10 Essential (primary) hypertension: Secondary | ICD-10-CM | POA: Diagnosis not present

## 2015-02-04 DIAGNOSIS — I493 Ventricular premature depolarization: Secondary | ICD-10-CM

## 2015-02-04 DIAGNOSIS — R110 Nausea: Secondary | ICD-10-CM | POA: Diagnosis not present

## 2015-02-04 MED ORDER — VERAPAMIL HCL ER 180 MG PO TBCR: 180.0000 mg | EXTENDED_RELEASE_TABLET | Freq: Every day | ORAL | Status: DC

## 2015-02-04 NOTE — Progress Notes (Signed)
Electrophysiology Office Note Date: 02/04/2015  ID:  Pamela [name], DOB 10-31-1945, MRN [id number]  PCP: Ann Held, MD Electrophysiologist: Rayann Heman  CC: PVC follow up  Pamela [name] is a 69 y.o. female is seen today for Dr Rayann Heman.  She presents today for routine electrophysiology followup.  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  She was started on Coreg at last office visit 6 weeks ago with Dr Rayann Heman.  Recent echocardiogram demonstrated EF 50-55% (previously 30-35%), grade 1 diastolic dysfunction, LA 39. Since last being seen in our clinic, the patient reports doing reasonably well.  She feels that her PVC burden is decreased but she is having significant problems with nausea and vomiting.  These symptoms correspond with initiation of Coreg and Exenatide. Her PVC's are worse on the days that she is having GI upset.  She reports some orthostatic intolerance and dyspnea on exertion but denies chest pain, PND, orthopnea, syncope.  She states that her glycemic control is much improved on the Exenatide.   Past Medical History  Diagnosis Date  . HTN (hypertension)   . GERD (gastroesophageal reflux disease)   . Hypercholesteremia   . Diabetes mellitus   . Obesity   . DJD (degenerative joint disease)   . PVC's (premature ventricular contractions)   . Protein S deficiency     prior DVT L knee, L arm DVT,  followed by Dr West Pugh at [facility]  . TIA (transient ischemic attack)   . S/P cardiac catheterization 02/2013    Normal coronaries; low normal EF at 50%  . Chronic anticoagulation    Past Surgical History  Procedure Laterality Date  . Tonsillectomy    . Total abdominal hysterectomy  1988  . Cholecystectomy  1993  . Left knee replacement  2006  . Carpal tunnel release  1994    bilateral    Current Outpatient Prescriptions  Medication Sig Dispense Refill  . aspirin 81 MG tablet Take 81 mg by mouth daily.    Marland Kitchen atorvastatin (LIPITOR) 20 MG tablet  Take 20 mg by mouth daily.    Marland Kitchen azelastine (ASTELIN) 137 MCG/SPRAY nasal spray Place 2 sprays into the nose daily as needed for rhinitis or allergies.     . carvedilol (COREG) 6.25 MG tablet Take 1 tablet (6.25 mg total) by mouth 2 (two) times daily. 60 tablet 6  . Cinnamon 500 MG capsule Take 1,000 mg by mouth daily.     . Exenatide (BYDUREON [place]onal) Inject 2 mg into the skin once a week. On Wednesdays    . fish oil-omega-3 fatty acids 1000 MG capsule Take 2 g by mouth daily.    . magnesium oxide (MAG-OX) 400 (241.3 MG) MG tablet Take 400 mg by mouth 2 (two) times daily.    . metFORMIN (GLUCOPHAGE) 1000 MG tablet Take 1,000 mg by mouth 2 (two) times daily with a meal.     . Multiple Vitamins-Minerals (ICAPS MV PO) Take 1 capsule by mouth daily.     . pantoprazole (PROTONIX) 40 MG tablet Take 40 mg by mouth daily.  5  . polycarbophil (FIBERCON) 625 MG tablet Take 625 mg by mouth daily.      . repaglinide (PRANDIN) 2 MG tablet Take 4 mg by mouth 3 (three) times daily before meals.     Marland Kitchen spironolactone (ALDACTONE) 25 MG tablet Take 25 mg by mouth daily.    . traZODone (DESYREL) 50 MG tablet Take 100 mg by mouth at  bedtime.     . valsartan (DIOVAN) 160 MG tablet Take 160 mg by mouth 2 (two) times daily.      Marland Kitchen warfarin (COUMADIN) 1 MG tablet Take 1 mg by mouth as directed.    . warfarin (COUMADIN) 5 MG tablet Take 5 mg by mouth daily.     No current facility-administered medications for this visit.    Allergies:   Ketek; Naproxen sodium; Amoxapine and related; Darvon; Amoxicillin; and Propoxyphene   Social History: History   Social History  . Marital Status: Married    Spouse Name: N/A  . Number of Children: N/A  . Years of Education: N/A   Occupational History  . Not on file.   Social History Main Topics  . Smoking status: Never Smoker   . Smokeless tobacco: Not on file  . Alcohol Use: No  . Drug Use: No  . Sexual Activity: Not on file   Other Topics Concern  . Not on file    Social History Narrative   Lives with spouse in [place].  2 grown daughters.   Retired Glass blower/designer      Family History: Family History  Problem Relation Age of Onset  . Cirrhosis Mother   . Antithrombin III deficiency Mother     multiple emboli  . Coronary artery disease Father   . Protein S deficiency Sister     prior DVT  . Protein S deficiency Daughter     Review of Systems: All other systems reviewed and are otherwise negative except as noted above.   Physical Exam: VS:  BP 138/70 mmHg  Pulse 70  Ht 5\' 4"  (1.626 m)  Wt 176 lb 6.4 oz (80.015 kg)  BMI 30.26 kg/m2  SpO2 99% , BMI Body mass index is 30.26 kg/(m^2). Wt Readings from Last 3 Encounters:  02/04/15 176 lb 6.4 oz (80.015 kg)  12/17/14 177 lb 9.6 oz (80.559 kg)  03/05/13 208 lb 6.4 oz (94.53 kg)    GEN- The patient is elderly appearing, alert and oriented x 3 today.   HEENT: normocephalic, atraumatic; sclera clear, conjunctiva pink; hearing intact; oropharynx clear; neck supple, no JVP Lymph- no cervical lymphadenopathy Lungs- Clear to ausculation bilaterally, normal work of breathing.  No wheezes, rales, rhonchi Heart- Regular rate and rhythm, no murmurs, rubs or gallops GI- soft, non-tender, non-distended, bowel sounds present Extremities- no clubbing, cyanosis, or edema; DP/PT/radial pulses 2+ bilaterally MS- no significant deformity or atrophy Skin- warm and dry, no rash or lesion  Psych- euthymic mood, full affect Neuro- strength and sensation are intact   EKG:  EKG is not ordered today.  Other studies Reviewed: Additional studies/ records that were reviewed today include: Dr Jackalyn Lombard office notes  Assessment and Plan: 1.  PVC's  The patient has symptomatic PVC's Coreg started at last visit with Dr Rayann Heman with improvement in PVC burden Echocardiogram demonstrated normalization of EF The patient has had nausea and vomiting associated with addition of Coreg and Exenatide.  I think that her  GI symptoms are much more likely to be related to the Exenatide, but the patient would like to stop Coreg. With normal LV function, will start Verapamil 180mg  daily.    If PVC burden increases, can consider addition of flecainide   2.  HTN Stable No change required today  3.  Possible atrial fibrillation She does not have documented AF in EPIC Continue Warfarin for Protein S deficiency  4.  Nausea/vomiting The patient has had nausea and vomiting that initiated  around the same time as initiation of Coreg and Exenatide.  She previously tolerated Coreg with no issues, but would like to change this medication today.  I have advised follow up with PCP about continuing Exenatide/further evaluation of nausea/vomiting.   Current medicines are reviewed at length with the patient today.   The patient does not have concerns regarding her medicines.  The following changes were made today:  Change coreg to Verapamil 180mg  daily  Labs/ tests ordered today include: none    Disposition:   Follow up with me in 4 weeks   Signed, Chanetta Marshall, NP 02/04/2015 2:42 PM   [facility] [address] [place] [address] [phone] (office) [phone] (fax)

## 2015-02-04 NOTE — Patient Instructions (Signed)
Medication Instructions:  Your physician has recommended you make the following change in your medication:  STOP Carvedilol START Verapamil 180mg  daily. An Rx has been sent to your pharmacy  Labwork: None   Testing/Procedures: None   Follow-Up: Your physician recommends that you schedule a follow-up appointment in: 1 month with Amber Seiler,NP   Any Other Special Instructions Will Be Listed Below (If Applicable).

## 2015-02-28 NOTE — Progress Notes (Signed)
Electrophysiology Office Note Date: 03/02/2015  ID:  Pamela [name], DOB 22-Mar-1946, MRN [id number]  PCP: Ann Held, MD Electrophysiologist: Rayann Heman  CC: PVC follow up  Pamela [name] is a 69 y.o. female is seen today for Dr Rayann Heman.  She presents today for routine electrophysiology followup.  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  Recent echocardiogram demonstrated EF 50-55% (previously 30-35%), grade 1 diastolic dysfunction, LA 39. She was placed on Coreg for treatment of PVC's but felt that this medication caused nausea and we changed to Verapamil at last office visit and she presents today for follow-up.  Since last being seen in our clinic, the patient reports doing reasonably well.  She feels that her PVC burden is decreased on Verapamil. She is tolerating the medication with only mild orthostatic intolerance which is stable.  She did have an episode last week where she felt like she was unable to think clearly for about 10 minutes and did not have control of her arm movements.  This has not recurred and occurred in the context of nausea and vomiting.    She reports denies chest pain, PND, orthopnea, syncope.   Past Medical History  Diagnosis Date  . HTN (hypertension)   . GERD (gastroesophageal reflux disease)   . Hypercholesteremia   . Diabetes mellitus   . Obesity   . DJD (degenerative joint disease)   . PVC's (premature ventricular contractions)   . Protein S deficiency     prior DVT L knee, L arm DVT,  followed by Dr West Pugh at [facility]  . TIA (transient ischemic attack)   . S/P cardiac catheterization 02/2013    Normal coronaries; low normal EF at 50%  . Chronic anticoagulation    Past Surgical History  Procedure Laterality Date  . Tonsillectomy    . Total abdominal hysterectomy  1988  . Cholecystectomy  1993  . Left knee replacement  2006  . Carpal tunnel release  1994    bilateral    Current Outpatient Prescriptions  Medication  Sig Dispense Refill  . aspirin 81 MG tablet Take 81 mg by mouth daily.    Marland Kitchen atorvastatin (LIPITOR) 20 MG tablet Take 20 mg by mouth daily.    Marland Kitchen azelastine (ASTELIN) 137 MCG/SPRAY nasal spray Place 2 sprays into the nose daily as needed for rhinitis or allergies.     . carvedilol (COREG) 6.25 MG tablet Take 6.25 mg by mouth 2 (two) times daily.  6  . Cinnamon 500 MG capsule Take 1,000 mg by mouth daily.     . fish oil-omega-3 fatty acids 1000 MG capsule Take 2 g by mouth daily.    . magnesium oxide (MAG-OX) 400 (241.3 MG) MG tablet Take 400 mg by mouth 2 (two) times daily.    . metFORMIN (GLUCOPHAGE) 1000 MG tablet Take 1,000 mg by mouth 2 (two) times daily with a meal.     . Multiple Vitamins-Minerals (ICAPS MV PO) Take 1 capsule by mouth daily.     . pantoprazole (PROTONIX) 40 MG tablet Take 40 mg by mouth daily.  5  . polycarbophil (FIBERCON) 625 MG tablet Take 625 mg by mouth daily.      . repaglinide (PRANDIN) 2 MG tablet Take 4 mg by mouth 3 (three) times daily before meals.     Marland Kitchen spironolactone (ALDACTONE) 25 MG tablet Take 25 mg by mouth daily.    . traZODone (DESYREL) 50 MG tablet Take 100 mg by  mouth at bedtime.     . valsartan (DIOVAN) 160 MG tablet Take 160 mg by mouth 2 (two) times daily.      . verapamil (CALAN-SR) 180 MG CR tablet Take 1 tablet (180 mg total) by mouth at bedtime. 30 tablet 5  . warfarin (COUMADIN) 1 MG tablet Take 1 mg by mouth as directed.    . warfarin (COUMADIN) 5 MG tablet Take 5 mg by mouth daily.     No current facility-administered medications for this visit.    Allergies:   Ketek; Naproxen sodium; Amoxapine and related; Darvon; Amoxicillin; and Propoxyphene   Social History: History   Social History  . Marital Status: Married    Spouse Name: N/A  . Number of Children: N/A  . Years of Education: N/A   Occupational History  . Not on file.   Social History Main Topics  . Smoking status: Never Smoker   . Smokeless tobacco: Not on file  .  Alcohol Use: No  . Drug Use: No  . Sexual Activity: Not on file   Other Topics Concern  . Not on file   Social History Narrative   Lives with spouse in [place].  2 grown daughters.   Retired Glass blower/designer      Family History: Family History  Problem Relation Age of Onset  . Cirrhosis Mother   . Antithrombin III deficiency Mother     multiple emboli  . Coronary artery disease Father   . Protein S deficiency Sister     prior DVT  . Protein S deficiency Daughter     Review of Systems: All other systems reviewed and are otherwise negative except as noted above.   Physical Exam: VS:  BP 126/58 mmHg  Pulse 76  Ht 5\' 4"  (1.626 m)  Wt 176 lb 12.8 oz (80.196 kg)  BMI 30.33 kg/m2 , BMI Body mass index is 30.33 kg/(m^2). Wt Readings from Last 3 Encounters:  03/02/15 176 lb 12.8 oz (80.196 kg)  02/04/15 176 lb 6.4 oz (80.015 kg)  12/17/14 177 lb 9.6 oz (80.559 kg)    GEN- The patient is elderly appearing, alert and oriented x 3 today.   HEENT: normocephalic, atraumatic; sclera clear, conjunctiva pink; hearing intact; oropharynx clear; neck supple  Lungs- Clear to ausculation bilaterally, normal work of breathing.  No wheezes, rales, rhonchi Heart- Regular rate and rhythm, no murmurs, rubs or gallops GI- soft, non-tender, non-distended, bowel sounds present Extremities- no clubbing, cyanosis, or edema; DP/PT/radial pulses 2+ bilaterally MS- no significant deformity or atrophy Skin- warm and dry, no rash or lesion  Psych- euthymic mood, full affect Neuro- strength and sensation are intact   EKG:  EKG is not ordered today.  Assessment and Plan: 1.  PVC's  The patient has symptomatic PVC's Echocardiogram demonstrated normalization of EF She felt that Coreg caused nausea and Verapamil was started at last visit with improvement in PVC burden. She would like to continue current plan at this point.  She may like to try Coreg again at some point, I have advised that this would  be okay in lieu of Verapamil (she has rx at home) If PVC burden increases, can consider addition of flecainide   2.  HTN Stable No change required today  3.  Possible atrial fibrillation She does not have documented AF in EPIC Continue Warfarin for Protein S deficiency  4.  Episode of AMS that lasted ~10 minutes Occurred in the context of nausea/vomting Neuro exam is non acute today  Advised to call PCP with further episodes - she has follow up scheduled 7/11   Current medicines are reviewed at length with the patient today.   The patient does not have concerns regarding her medicines.  The following changes were made today:  none  Labs/ tests ordered today include: none    Disposition:   Follow up with me in 6 months   Signed, Chanetta Marshall, NP 03/02/2015 9:00 AM   [place] [address] [phone] (office) [phone] (fax)

## 2015-03-02 ENCOUNTER — Encounter: Payer: Self-pay | Admitting: Nurse Practitioner

## 2015-03-02 ENCOUNTER — Ambulatory Visit (INDEPENDENT_AMBULATORY_CARE_PROVIDER_SITE_OTHER): Payer: PPO | Admitting: Nurse Practitioner

## 2015-03-02 VITALS — BP 126/58 | HR 76 | Ht 64.0 in | Wt 176.8 lb

## 2015-03-02 DIAGNOSIS — I10 Essential (primary) hypertension: Secondary | ICD-10-CM

## 2015-03-02 DIAGNOSIS — I493 Ventricular premature depolarization: Secondary | ICD-10-CM

## 2015-03-02 NOTE — Patient Instructions (Signed)
Medication Instructions:  MAY TRY  CARVEDILOL  AGAIN  Labwork: NONE  Testing/Procedures: NONE  Follow-Up: Your physician wants you to follow-up in:   [place] will receive a reminder letter in the mail two months in advance. If you don't receive a letter, please call our office to schedule the follow-up appointment.   Any Other Special Instructions Will Be Listed Below (If Applicable).

## 2015-09-06 HISTORY — PX: CATARACT EXTRACTION W/ INTRAOCULAR LENS IMPLANT: SHX1309

## 2015-09-10 ENCOUNTER — Ambulatory Visit (INDEPENDENT_AMBULATORY_CARE_PROVIDER_SITE_OTHER): Payer: Medicare Other | Admitting: Nurse Practitioner

## 2015-09-10 ENCOUNTER — Encounter: Payer: Self-pay | Admitting: Nurse Practitioner

## 2015-09-10 VITALS — BP 142/60 | HR 56 | Ht 64.0 in | Wt 176.0 lb

## 2015-09-10 DIAGNOSIS — R079 Chest pain, unspecified: Secondary | ICD-10-CM

## 2015-09-10 DIAGNOSIS — R0602 Shortness of breath: Secondary | ICD-10-CM | POA: Diagnosis not present

## 2015-09-10 NOTE — Patient Instructions (Addendum)
Medication Instructions:   Your physician recommends that you continue on your current medications as directed. Please refer to the Current Medication list given to you today.   If you need a refill on your cardiac medications before your next appointment, please call your pharmacy.  Labwork: NONE ORDER TODAY   Testing/Procedures: NONE ORDER TODAY    Follow-Up:Your physician wants you to follow-up in: ONE YEAR WITH  AMBER SEILER    You will receive a reminder letter in the mail two months in advance. If you don't receive a letter, please call our office to schedule the follow-up appointment.      Any Other Special Instructions Will Be Listed Below (If Applicable).                                                                                                                                                   

## 2015-09-10 NOTE — Progress Notes (Signed)
Electrophysiology Office Note Date: 09/10/2015  ID:  Pamela [name], DOB 11-19-1945, MRN [id number]  PCP: Ann Held, MD Electrophysiologist: Rayann Heman  CC: PVC follow up  Pamela [name] is a 70 y.o. female is seen today for Dr Rayann Heman.  She presents today for routine electrophysiology followup.  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  Echocardiogram 12/2014 demonstrated EF 50-55% (previously 30-35%), grade 1 diastolic dysfunction, LA 39. She has switched back to coreg from Verapamil with good control of PVC's. She reports intermittent mid scapular back pain with radiation that occurs approximately once per month. It occurs with more than usual physical activity.  She denies shortness of breath, PND, orthopnea, syncope, palpitations. She also feels fatigued with exertion.   Echo 12/2014 demonstrated EF 50-55%, mild LVH, grade 1 diastolic dysfunction, normal aorta  Cath 02/2013 demonstrated normal coronary anatomy, EF 50%.  Past Medical History  Diagnosis Date  . HTN (hypertension)   . GERD (gastroesophageal reflux disease)   . Hypercholesteremia   . Diabetes mellitus   . Obesity   . DJD (degenerative joint disease)   . PVC's (premature ventricular contractions)   . Protein S deficiency ([place])     prior DVT L knee, L arm DVT,  followed by Dr West Pugh at [facility]  . TIA (transient ischemic attack)   . S/P cardiac catheterization 02/2013    Normal coronaries; low normal EF at 50%  . Chronic anticoagulation    Past Surgical History  Procedure Laterality Date  . Tonsillectomy    . Total abdominal hysterectomy  1988  . Cholecystectomy  1993  . Left knee replacement  2006  . Carpal tunnel release  1994    bilateral    Current Outpatient Prescriptions  Medication Sig Dispense Refill  . aspirin 81 MG tablet Take 81 mg by mouth daily.    Marland Kitchen atorvastatin (LIPITOR) 20 MG tablet Take 20 mg by mouth daily.    Marland Kitchen azelastine (ASTELIN) 137 MCG/SPRAY nasal spray  Place 2 sprays into the nose daily as needed for rhinitis or allergies.     . carvedilol (COREG) 6.25 MG tablet Take 6.25 mg by mouth 2 (two) times daily.  6  . Cinnamon 500 MG capsule Take 1,000 mg by mouth daily.     . CVS ZINC 50 MG TABS TAKE 1 TABLET BY MOUTH 2 TIMES DAILY. TAKE WITH FOOD  0  . magnesium oxide (MAG-OX) 400 (241.3 MG) MG tablet Take 400 mg by mouth 2 (two) times daily.    . metFORMIN (GLUCOPHAGE) 1000 MG tablet Take 1,000 mg by mouth 2 (two) times daily with a meal.     . Multiple Vitamins-Minerals (ICAPS MV PO) Take 1 capsule by mouth daily.     . pantoprazole (PROTONIX) 40 MG tablet Take 40 mg by mouth daily.  5  . polycarbophil (FIBERCON) 625 MG tablet Take 625 mg by mouth daily.      . repaglinide (PRANDIN) 2 MG tablet Take 4 mg by mouth 3 (three) times daily before meals.     Marland Kitchen spironolactone (ALDACTONE) 25 MG tablet Take 25 mg by mouth daily.    . traZODone (DESYREL) 50 MG tablet Take 100 mg by mouth at bedtime.     . valsartan (DIOVAN) 160 MG tablet Take 160 mg by mouth 2 (two) times daily.      Marland Kitchen warfarin (COUMADIN) 1 MG tablet Take 1 mg by mouth as directed.    . warfarin (COUMADIN)  5 MG tablet Take 5 mg by mouth daily.     No current facility-administered medications for this visit.    Allergies:   Ketek; Loxapine succinate; Naproxen sodium; Amoxapine and related; Darvon; Amoxicillin; and Propoxyphene   Social History: Social History   Social History  . Marital Status: Married    Spouse Name: N/A  . Number of Children: N/A  . Years of Education: N/A   Occupational History  . Not on file.   Social History Main Topics  . Smoking status: Never Smoker   . Smokeless tobacco: Not on file  . Alcohol Use: No  . Drug Use: No  . Sexual Activity: Not on file   Other Topics Concern  . Not on file   Social History Narrative   Lives with spouse in [place].  2 grown daughters.   Retired Glass blower/designer      Family History: Family History  Problem  Relation Age of Onset  . Cirrhosis Mother   . Antithrombin III deficiency Mother     multiple emboli  . Coronary artery disease Father   . Protein S deficiency Sister     prior DVT  . Protein S deficiency Daughter   . Heart attack Father   . Hypertension Father   . Hypertension Sister   . Hypertension Brother   . Heart attack Brother   . Heart attack Sister   . Stroke Neg Hx     Review of Systems: All other systems reviewed and are otherwise negative except as noted above.   Physical Exam: VS:  BP 142/60 mmHg  Pulse 56  Ht 5\' 4"  (1.626 m)  Wt 176 lb (79.833 kg)  BMI 30.20 kg/m2  SpO2  , BMI Body mass index is 30.2 kg/(m^2). Wt Readings from Last 3 Encounters:  09/10/15 176 lb (79.833 kg)  03/02/15 176 lb 12.8 oz (80.196 kg)  02/04/15 176 lb 6.4 oz (80.015 kg)    GEN- The patient is elderly appearing, alert and oriented x 3 today.   HEENT: normocephalic, atraumatic; sclera clear, conjunctiva pink; hearing intact; oropharynx clear; neck supple  Lungs- Clear to ausculation bilaterally, normal work of breathing.  No wheezes, rales, rhonchi Heart- Regular rate and rhythm, no murmurs, rubs or gallops GI- soft, non-tender, non-distended, bowel sounds present Extremities- no clubbing, cyanosis, or edema; DP/PT/radial pulses 2+ bilaterally MS- no significant deformity or atrophy Skin- warm and dry, no rash or lesion  Psych- euthymic mood, full affect Neuro- strength and sensation are intact   EKG:  EKG is ordered today. EKG today demonstrates sinus rhythm   Assessment and Plan: 1.  PVC's  The patient has symptomatic PVC's that have resolved with Coreg Continue for now If recurrence, could consider Flecainide in the future  Slightly bradycardic on Coreg, ambulated today in office with adequate HR response to activity  2.  HTN Stable No change required today  3.  Possible atrial fibrillation She does not have documented AF in EPIC Continue Warfarin for Protein S  deficiency  4.  Back pain I do not feel that this is anginal equivalent with normal cath in 2014 and normal echo this year - discussed with Dr Lovena Le today I have advised follow up with PCP    Current medicines are reviewed at length with the patient today.   The patient does not have concerns regarding her medicines.  The following changes were made today:  none  Labs/ tests ordered today include: none    Disposition:  Follow up with me in 1 year   Signed, Chanetta Marshall, NP 09/10/2015 10:55 AM   [place] [address] [phone] (office) [phone] (fax)

## 2015-11-17 ENCOUNTER — Other Ambulatory Visit: Payer: Self-pay | Admitting: Internal Medicine

## 2015-11-17 NOTE — Telephone Encounter (Signed)
Patsey Berthold, NP at 09/10/2015 9:39 AM  carvedilol (COREG) 6.25 MG tabletTake 6.25 mg by mouth 2 (two) times daily Assessment and Plan: 1. PVC's  The patient has symptomatic PVC's that have resolved with Coreg Continue for now If recurrence, could consider Flecainide in the future  Slightly bradycardic on Coreg, ambulated today in office with adequate HR response to activity  Patient Instructions     Medication Instructions:   Your physician recommends that you continue on your current medications as directed. Please refer to the Current Medication list given to you today.

## 2016-01-10 ENCOUNTER — Other Ambulatory Visit: Payer: Self-pay | Admitting: Nurse Practitioner

## 2016-07-24 NOTE — Progress Notes (Signed)
Electrophysiology Office Note Date: 07/25/2016  ID:  Pamela [name], DOB May 17, 1946, MRN [id number]  PCP: Ann Held, MD Electrophysiologist: Rayann Heman  CC: PVC follow up  Pamela [name] is a 70 y.o. female is seen today for Dr Rayann Heman.  She presents today for routine electrophysiology followup.  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  Echocardiogram 12/2014 demonstrated EF 50-55% (previously 30-35%), grade 1 diastolic dysfunction, LA 39. Her PVC's have been relatively well controlled with Coreg. She has noticed increased recently as she is working on the [employer] in [place] and going up and down stairs much more frequently. She has also been doing a lot of sewing for Christmas and notices right side dullness that radiates to her chest with prolonged sitting.  She denies shortness of breath, PND, orthopnea, syncope.   Echo 12/2014 demonstrated EF 50-55%, mild LVH, grade 1 diastolic dysfunction, normal aorta  Cath 02/2013 demonstrated normal coronary anatomy, EF 50%.  Past Medical History:  Diagnosis Date  . Chronic anticoagulation   . Diabetes mellitus   . DJD (degenerative joint disease)   . GERD (gastroesophageal reflux disease)   . HTN (hypertension)   . Hypercholesteremia   . Obesity   . Protein S deficiency ([place])    prior DVT L knee, L arm DVT,  follow[facility] by Dr West Pugh at [facility]  . PVC's (premature ventricular contractions)   . S/P cardiac catheterization 02/2013   Normal coronaries; low normal EF at 50%  . TIA (transient ischemic attack)    Past Surgical History:  Procedure Laterality Date  . CARPAL TUNNEL RELEASE  1994   bilateral  . CHOLECYSTECTOMY  1993  . left knee replacement  2006  . TONSILLECTOMY    . TOTAL ABDOMINAL HYSTERECTOMY  1988    Current Outpatient Prescriptions  Medication Sig Dispense Refill  . aspirin 81 MG tablet Take 81 mg by mouth daily.    Marland Kitchen atorvastatin (LIPITOR) 20 MG tablet Take 20 mg by mouth  daily.    Marland Kitchen azelastine (ASTELIN) 137 MCG/SPRAY nasal spray Place 2 sprays into the nose daily as needed for rhinitis or allergies.     . carvedilol (COREG) 6.25 MG tablet TAKE 1 TABLET BY MOUTH TWICE DAILY 60 tablet 7  . Cinnamon 500 MG capsule Take 1,000 mg by mouth daily.     . CVS ZINC 50 MG TABS TAKE 1 TABLET BY MOUTH 2 TIMES DAILY. TAKE WITH FOOD  0  . magnesium oxide (MAG-OX) 400 (241.3 MG) MG tablet Take 400 mg by mouth 2 (two) times daily.    . metFORMIN (GLUCOPHAGE) 1000 MG tablet Take 1,000 mg by mouth 2 (two) times daily with a meal.     . Multiple Vitamins-Minerals (ICAPS MV PO) Take 1 capsule by mouth daily.     . pantoprazole (PROTONIX) 40 MG tablet Take 40 mg by mouth daily.  5  . polycarbophil (FIBERCON) 625 MG tablet Take 625 mg by mouth daily.      . repaglinide (PRANDIN) 2 MG tablet Take 4 mg by mouth 3 (three) times daily before meals.     Marland Kitchen spironolactone (ALDACTONE) 25 MG tablet Take 25 mg by mouth daily.    . traZODone (DESYREL) 50 MG tablet Take 100 mg by mouth at bedtime.     . valsartan (DIOVAN) 160 MG tablet Take 160 mg by mouth 2 (two) times daily.      Marland Kitchen warfarin (COUMADIN) 1 MG tablet Take 1  mg by mouth as directed.    . warfarin (COUMADIN) 5 MG tablet Take 5 mg by mouth daily.     No current facility-administered medications for this visit.     Allergies:   Ketek [telithromycin]; Loxapine succinate; Naproxen sodium; Amoxapine and related; Darvon; Amoxicillin; and Propoxyphene   Social History: Social History   Social History  . Marital status: Married    Spouse name: N/A  . Number of children: N/A  . Years of education: N/A   Occupational History  . Not on file.   Social History Main Topics  . Smoking status: Never Smoker  . Smokeless tobacco: Never Used  . Alcohol use No  . Drug use: No  . Sexual activity: Not on file   Other Topics Concern  . Not on file   Social History Narrative   Lives with spouse in [place].  2 grown daughters.    Retired Glass blower/designer      Family History: Family History  Problem Relation Age of Onset  . Cirrhosis Mother   . Antithrombin III deficiency Mother     multiple emboli  . Coronary artery disease Father   . Heart attack Father   . Hypertension Father   . Protein S deficiency Sister     prior DVT  . Protein S deficiency Daughter   . Hypertension Sister   . Hypertension Brother   . Heart attack Brother   . Heart attack Sister   . Stroke Neg Hx     Review of Systems: All other systems reviewed and are otherwise negative except as noted above.   Physical Exam: VS:  BP 140/60   Pulse 73   Ht 5\' 6"  (1.676 m)   Wt 184 lb 6.4 oz (83.6 kg)   SpO2 94%   BMI 29.76 kg/m  , BMI Body mass index is 29.76 kg/m. Wt Readings from Last 3 Encounters:  07/25/16 184 lb 6.4 oz (83.6 kg)  09/10/15 176 lb (79.8 kg)  03/02/15 176 lb 12.8 oz (80.2 kg)    GEN- The patient is elderly appearing, alert and oriented x 3 today.   HEENT: normocephalic, atraumatic; sclera clear, conjunctiva pink; hearing intact; oropharynx clear; neck supple  Lungs- Clear to ausculation bilaterally, normal work of breathing.  No wheezes, rales, rhonchi Heart- Regular rate and rhythm, no murmurs, rubs or gallops GI- soft, non-tender, non-distended, bowel sounds present Extremities- no clubbing, cyanosis, or edema; DP/PT/radial pulses 2+ bilaterally MS- no significant deformity or atrophy Skin- warm and dry, no rash or lesion  Psych- euthymic mood, full affect Neuro- strength and sensation are intact   EKG:  EKG is ordered today. EKG today demonstrates sinus rhythm  Assessment and Plan: 1.  PVC's  The patient has symptomatic PVC's that have resolved with Coreg Continue for now I have told her for increased PVC burden while doing increased activity for the next few weeks that she can take extra Coreg as needed.  Could consider Flecainide in the future   2.  HTN Stable No change required today  3.   Possible atrial fibrillation She does not have documented AF in EPIC Continue Warfarin for Protein S deficiency   Current medicines are reviewed at length with the patient today.   The patient does not have concerns regarding her medicines.  The following changes were made today:  none  Labs/ tests ordered today include: none    Disposition:   Follow up with me in 1 year   Signed, [employer]  Lynnell Jude, NP 07/25/2016 1:31 PM   [place] [address] [phone] (office) [phone] (fax)

## 2016-07-25 ENCOUNTER — Ambulatory Visit (INDEPENDENT_AMBULATORY_CARE_PROVIDER_SITE_OTHER): Payer: Medicare Other | Admitting: Nurse Practitioner

## 2016-07-25 ENCOUNTER — Encounter: Payer: Self-pay | Admitting: Nurse Practitioner

## 2016-07-25 VITALS — BP 140/60 | HR 73 | Ht 66.0 in | Wt 184.4 lb

## 2016-07-25 DIAGNOSIS — I10 Essential (primary) hypertension: Secondary | ICD-10-CM

## 2016-07-25 DIAGNOSIS — D6859 Other primary thrombophilia: Secondary | ICD-10-CM | POA: Diagnosis not present

## 2016-07-25 DIAGNOSIS — I493 Ventricular premature depolarization: Secondary | ICD-10-CM

## 2016-07-25 NOTE — Patient Instructions (Addendum)
Medication Instructions:   Your physician recommends that you continue on your current medications as directed. Please refer to the Current Medication list given to you today.   If you need a refill on your cardiac medications before your next appointment, please call your pharmacy.  Labwork: NONE ORDERED  TODAY    Testing/Procedures: NONE ORDERED  TODAY    Follow-Up: Your physician wants you to follow-up in: ONE YEAR WITH  SEILER   You will receive a reminder letter in the mail two months in advance. If you don't receive a letter, please call our office to schedule the follow-up appointment.     Any Other Special Instructions Will Be Listed Below (If Applicable).                                                                                                                                                   

## 2016-08-22 ENCOUNTER — Other Ambulatory Visit: Payer: Self-pay | Admitting: Nurse Practitioner

## 2016-09-05 HISTORY — PX: FINGER SURGERY: SHX640

## 2016-09-05 HISTORY — PX: OTHER SURGICAL HISTORY: SHX169

## 2017-05-17 ENCOUNTER — Other Ambulatory Visit: Payer: Self-pay | Admitting: Nurse Practitioner

## 2017-05-18 NOTE — Telephone Encounter (Signed)
Please call office and schedule appointment for 11/18 for further refills.

## 2017-11-09 NOTE — Progress Notes (Signed)
Electrophysiology Office Note Date: 11/10/2017  ID:  Pamela [name], DOB 06-06-1946, MRN [id number]  PCP: Myer Peer, MD Electrophysiologist: Rayann Heman  CC: PVC follow up  Pamela [name] is a 72 y.o. female is seen today for Dr Rayann Heman.  She presents today for routine electrophysiology followup.  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  Echocardiogram 12/2014 demonstrated EF 50-55% (previously 30-35%), grade 1 diastolic dysfunction, LA 39. Her PVC's have been relatively well controlled with Coreg. She reports doing relatively well since last being seen in clinic.  Her husband recently had knee replacement surgery and she has been busy caring for him. She also currently has bronchitis and is recovering from that. With the combination, she has been more fatigued recently.  She denies shortness of breath, PND, orthopnea, syncope.   Echo 12/2014 demonstrated EF 50-55%, mild LVH, grade 1 diastolic dysfunction, normal aorta  Cath 02/2013 demonstrated normal coronary anatomy, EF 50%.  Past Medical History:  Diagnosis Date  . Chronic anticoagulation   . Diabetes mellitus   . DJD (degenerative joint disease)   . GERD (gastroesophageal reflux disease)   . HTN (hypertension)   . Hypercholesteremia   . Obesity   . Protein S deficiency ([place])    prior DVT L knee, L arm DVT,  followed by Dr West Pugh at [facility]  . PVC's (premature ventricular contractions)   . S/P cardiac catheterization 02/2013   Normal coronaries; low normal EF at 50%  . TIA (transient ischemic attack)    Past Surgical History:  Procedure Laterality Date  . CARPAL TUNNEL RELEASE  1994   bilateral  . CHOLECYSTECTOMY  1993  . left knee replacement  2006  . TONSILLECTOMY    . TOTAL ABDOMINAL HYSTERECTOMY  1988    Current Outpatient Medications  Medication Sig Dispense Refill  . aspirin 81 MG tablet Take 81 mg by mouth daily.    Marland Kitchen atorvastatin (LIPITOR) 20 MG tablet Take 20 mg by mouth daily.     Marland Kitchen azelastine (ASTELIN) 137 MCG/SPRAY nasal spray Place 2 sprays into the nose daily as needed for rhinitis or allergies.     Marland Kitchen azithromycin (ZITHROMAX) 500 MG tablet Take 1 tablet by mouth daily.  0  . carvedilol (COREG) 6.25 MG tablet TAKE 1 TABLET BY MOUTH TWICE DAILY 60 tablet 7  . CVS ZINC 50 MG TABS TAKE 1 TABLET BY MOUTH 2 TIMES DAILY. TAKE WITH FOOD  0  . HYDROMET 5-1.5 MG/5ML syrup Take 5 mLs by mouth 4 (four) times daily as needed for cough.  0  . ipratropium-albuterol (DUONEB) 0.5-2.5 (3) MG/3ML SOLN Take 3 mLs by nebulization 4 (four) times daily as needed. wheezing  0  . magnesium oxide (MAG-OX) 400 (241.3 MG) MG tablet Take 400 mg by mouth 2 (two) times daily.    . metFORMIN (GLUCOPHAGE) 1000 MG tablet Take 1,000 mg by mouth 2 (two) times daily with a meal.     . Multiple Vitamin (MULTI-VITAMIN DAILY PO) Take 1 tablet by mouth daily. Advanced Multi vitamin high fiber    . MYRBETRIQ 50 MG TB24 tablet Take 1 tablet by mouth daily.  0  . repaglinide (PRANDIN) 2 MG tablet Take 4 mg by mouth 3 (three) times daily before meals.     Marland Kitchen spironolactone (ALDACTONE) 25 MG tablet Take 25 mg by mouth daily.    . traZODone (DESYREL) 50 MG tablet Take 100 mg by mouth at bedtime.     Marland Kitchen  valsartan (DIOVAN) 160 MG tablet Take 160 mg by mouth 2 (two) times daily.      Marland Kitchen warfarin (COUMADIN) 1 MG tablet Take 1 mg by mouth as directed.    . warfarin (COUMADIN) 5 MG tablet Take 5 mg by mouth daily.     No current facility-administered medications for this visit.     Allergies:   Ketek [telithromycin]; Loxapine succinate; Naproxen sodium; Amoxapine and related; Darvon; Amoxicillin; and Propoxyphene   Social History: Social History   Socioeconomic History  . Marital status: Married    Spouse name: Not on file  . Number of children: Not on file  . Years of education: Not on file  . Highest education level: Not on file  Social Needs  . Financial resource strain: Not on file  . Food insecurity -  worry: Not on file  . Food insecurity - inability: Not on file  . Transportation needs - medical: Not on file  . Transportation needs - non-medical: Not on file  Occupational History  . Not on file  Tobacco Use  . Smoking status: Never Smoker  . Smokeless tobacco: Never Used  Substance and Sexual Activity  . Alcohol use: No  . Drug use: No  . Sexual activity: Not on file  Other Topics Concern  . Not on file  Social History Narrative   Lives with spouse in [place].  2 grown daughters.   Retired Glass blower/designer      Family History: Family History  Problem Relation Age of Onset  . Cirrhosis Mother   . Antithrombin III deficiency Mother        multiple emboli  . Coronary artery disease Father   . Heart attack Father   . Hypertension Father   . Protein S deficiency Sister        prior DVT  . Protein S deficiency Daughter   . Hypertension Sister   . Hypertension Brother   . Heart attack Brother   . Heart attack Sister   . Stroke Neg Hx     Review of Systems: All other systems reviewed and are otherwise negative except as noted above.   Physical Exam: VS:  BP (!) 124/54   Pulse 79   Ht 5\' 5"  (1.651 m)   Wt 181 lb 12.8 oz (82.5 kg)   BMI 30.25 kg/m  , BMI Body mass index is 30.25 kg/m. Wt Readings from Last 3 Encounters:  11/10/17 181 lb 12.8 oz (82.5 kg)  07/25/16 184 lb 6.4 oz (83.6 kg)  09/10/15 176 lb (79.8 kg)    GEN- The patient is elderly appearing, alert and oriented x 3 today.   HEENT: normocephalic, atraumatic; sclera clear, conjunctiva pink; hearing intact; oropharynx clear; neck supple  Lungs- normal work of breathing, scattered rhonchi  Heart- Regular rate and rhythm, no murmurs, rubs or gallops GI- soft, non-tender, non-distended, bowel sounds present Extremities- no clubbing, cyanosis, or edema; DP/PT/radial pulses 2+ bilaterally MS- no significant deformity or atrophy Skin- warm and dry, no rash or lesion  Psych- euthymic mood, full  affect Neuro- strength and sensation are intact   EKG:  EKG is ordered today. EKG today demonstrates sinus rhythm  Assessment and Plan: 1.  PVC's  The patient has symptomatic PVC's that have resolved with Coreg Continue for now Could consider Flecainide in the future   2.  HTN Stable No change required today  3.  Possible atrial fibrillation She does not have documented AF in EPIC Continue Warfarin for Protein  S deficiency   Current medicines are reviewed at length with the patient today.   The patient does not have concerns regarding her medicines.  The following changes were made today:  none  Labs/ tests ordered today include: none    Disposition:   Follow up with me in 1 year   Signed, Chanetta Marshall, NP 11/10/2017 8:26 AM   [address] [place] [id number] [phone] (office) [phone] (fax)

## 2017-11-10 ENCOUNTER — Ambulatory Visit (INDEPENDENT_AMBULATORY_CARE_PROVIDER_SITE_OTHER): Payer: Medicare Other | Admitting: Nurse Practitioner

## 2017-11-10 ENCOUNTER — Encounter: Payer: Self-pay | Admitting: Nurse Practitioner

## 2017-11-10 VITALS — BP 124/54 | HR 79 | Ht 65.0 in | Wt 181.8 lb

## 2017-11-10 DIAGNOSIS — D6859 Other primary thrombophilia: Secondary | ICD-10-CM

## 2017-11-10 DIAGNOSIS — I10 Essential (primary) hypertension: Secondary | ICD-10-CM | POA: Diagnosis not present

## 2017-11-10 DIAGNOSIS — I493 Ventricular premature depolarization: Secondary | ICD-10-CM | POA: Diagnosis not present

## 2017-11-10 NOTE — Patient Instructions (Addendum)
Medication Instructions:   Your physician recommends that you continue on your current medications as directed. Please refer to the Current Medication list given to you today.   If you need a refill on your cardiac medications before your next appointment, please call your pharmacy.  Labwork: NONE ORDERED  TODAY    Testing/Procedures: NONE ORDERED  TODAY    Follow-Up: Your physician wants you to follow-up in: ONE YEAR WITH  SEILER   You will receive a reminder letter in the mail two months in advance. If you don't receive a letter, please call our office to schedule the follow-up appointment.     Any Other Special Instructions Will Be Listed Below (If Applicable).                                                                                                                                                   

## 2018-01-23 ENCOUNTER — Other Ambulatory Visit: Payer: Self-pay | Admitting: *Deleted

## 2018-01-23 MED ORDER — CARVEDILOL 6.25 MG PO TABS: 6.2500 mg | ORAL_TABLET | Freq: Two times a day (BID) | ORAL | 2 refills | Status: DC

## 2018-07-06 DIAGNOSIS — M1990 Unspecified osteoarthritis, unspecified site: Secondary | ICD-10-CM

## 2018-07-06 DIAGNOSIS — M1711 Unilateral primary osteoarthritis, right knee: Secondary | ICD-10-CM

## 2018-07-06 DIAGNOSIS — M545 Low back pain, unspecified: Secondary | ICD-10-CM

## 2018-07-06 DIAGNOSIS — M25561 Pain in right knee: Secondary | ICD-10-CM

## 2018-07-06 HISTORY — DX: Unilateral primary osteoarthritis, right knee: M17.11

## 2018-07-06 HISTORY — DX: Low back pain: M54.5

## 2018-07-06 HISTORY — DX: Pain in right knee: M25.561

## 2018-07-06 HISTORY — DX: Low back pain, unspecified: M54.50

## 2018-07-06 HISTORY — DX: Unspecified osteoarthritis, unspecified site: M19.90

## 2018-08-09 DIAGNOSIS — M4316 Spondylolisthesis, lumbar region: Secondary | ICD-10-CM | POA: Insufficient documentation

## 2018-08-09 DIAGNOSIS — S39012A Strain of muscle, fascia and tendon of lower back, initial encounter: Secondary | ICD-10-CM | POA: Insufficient documentation

## 2018-08-09 HISTORY — DX: Strain of muscle, fascia and tendon of lower back, initial encounter: S39.012A

## 2018-08-09 HISTORY — DX: Spondylolisthesis, lumbar region: M43.16

## 2018-10-26 ENCOUNTER — Encounter: Payer: Self-pay | Admitting: Nurse Practitioner

## 2018-11-12 ENCOUNTER — Ambulatory Visit (INDEPENDENT_AMBULATORY_CARE_PROVIDER_SITE_OTHER): Payer: Medicare Other | Admitting: Internal Medicine

## 2018-11-12 ENCOUNTER — Encounter: Payer: Self-pay | Admitting: Internal Medicine

## 2018-11-12 VITALS — BP 164/78 | HR 66 | Ht 65.0 in | Wt 175.0 lb

## 2018-11-12 DIAGNOSIS — I10 Essential (primary) hypertension: Secondary | ICD-10-CM

## 2018-11-12 DIAGNOSIS — I493 Ventricular premature depolarization: Secondary | ICD-10-CM

## 2018-11-12 MED ORDER — CARVEDILOL 12.5 MG PO TABS: 12.5000 mg | ORAL_TABLET | Freq: Two times a day (BID) | ORAL | 3 refills | Status: DC

## 2018-11-12 MED ORDER — CARVEDILOL 6.25 MG PO TABS: 12.5000 mg | ORAL_TABLET | Freq: Two times a day (BID) | ORAL | 2 refills | Status: DC

## 2018-11-12 NOTE — Progress Notes (Signed)
PCP: Serita Grammes, MD   Primary EP: Dr Sharlyne Pacas is a 73 y.o. female who presents today for routine electrophysiology followup.  Since last being seen in our clinic, the patient reports doing very well.  Her primary concern is with elevated BP.  PVCs are well controlled.  Today, she denies symptoms of palpitations, chest pain, shortness of breath,  lower extremity edema, dizziness, presyncope, or syncope.  The patient is otherwise without complaint today.   Past Medical History:  Diagnosis Date  . Chronic anticoagulation   . Diabetes mellitus   . DJD (degenerative joint disease)   . GERD (gastroesophageal reflux disease)   . HTN (hypertension)   . Hypercholesteremia   . Obesity   . Protein S deficiency ([place])    prior DVT L knee, L arm DVT,  followed by Dr West Pugh at [facility]  . PVC's (premature ventricular contractions)   . S/P cardiac catheterization 02/2013   Normal coronaries; low normal EF at 50%  . TIA (transient ischemic attack)    Past Surgical History:  Procedure Laterality Date  . CARPAL TUNNEL RELEASE  1994   bilateral  . CHOLECYSTECTOMY  1993  . left knee replacement  2006  . TONSILLECTOMY    . TOTAL ABDOMINAL HYSTERECTOMY  1988    ROS- all systems are reviewed and negatives except as per HPI above  Current Outpatient Medications  Medication Sig Dispense Refill  . aspirin 81 MG tablet Take 81 mg by mouth daily.    Marland Kitchen atorvastatin (LIPITOR) 20 MG tablet Take 20 mg by mouth daily.    Marland Kitchen azelastine (ASTELIN) 137 MCG/SPRAY nasal spray Place 2 sprays into the nose daily as needed for rhinitis or allergies.     . carvedilol (COREG) 6.25 MG tablet Take 1 tablet (6.25 mg total) by mouth 2 (two) times daily. 180 tablet 2  . ipratropium-albuterol (DUONEB) 0.5-2.5 (3) MG/3ML SOLN Take 3 mLs by nebulization 4 (four) times daily as needed. wheezing  0  . magnesium oxide (MAG-OX) 400 (241.3 MG) MG tablet Take 400 mg by mouth 2 (two) times daily.    .  metFORMIN (GLUCOPHAGE) 1000 MG tablet Take 1,000 mg by mouth 2 (two) times daily with a meal.     . Multiple Vitamin (MULTI-VITAMIN DAILY PO) Take 1 tablet by mouth daily. Advanced Multi vitamin high fiber    . repaglinide (PRANDIN) 2 MG tablet Take 4 mg by mouth 3 (three) times daily before meals.     . traZODone (DESYREL) 50 MG tablet Take 100 mg by mouth at bedtime.     Marland Kitchen warfarin (COUMADIN) 1 MG tablet Take 1 mg by mouth as directed.    . warfarin (COUMADIN) 5 MG tablet Take 5 mg by mouth daily.     No current facility-administered medications for this visit.     Physical Exam: Vitals:   11/12/18 1313  BP: (!) 164/78  Pulse: 66  SpO2: 98%  Weight: 175 lb (79.4 kg)  Height: 5\' 5"  (1.651 m)    GEN- The patient is well appearing, alert and oriented x 3 today.   Head- normocephalic, atraumatic Eyes-  Sclera clear, conjunctiva pink Ears- hearing intact Oropharynx- clear Lungs- Clear to ausculation bilaterally, normal work of breathing Heart- Regular rate and rhythm, no murmurs, rubs or gallops, PMI not laterally displaced GI- soft, NT, ND, + BS Extremities- no clubbing, cyanosis, or edema  Wt Readings from Last 3 Encounters:  11/12/18 175 lb (79.4 kg)  11/10/17  181 lb 12.8 oz (82.5 kg)  07/25/16 184 lb 6.4 oz (83.6 kg)    EKG tracing ordered today is personally reviewed and shows sinus rhythm  Assessment and Plan:  1. PVCs Resolved with coreg  2. HTN Elevated at home (multiple journal readings are reviewed today) Increase coreg to 12.5mg  BID today Consider norvasc if remains elevated Sodium restriction and weight loss would be very beneficial  3. ? afib Not well documented On coumadin for Protein S deficiency  Return to see EP NP every year Follow-up with PCP regarding BP I will see when needed  Thompson Grayer MD, [facility] 11/12/2018 1:17 PM

## 2018-11-12 NOTE — Patient Instructions (Signed)
Medication Instructions:  Your physician has recommended you make the following change in your medication:  1. Increase your Coreg to 12.5mg  tablet, two times per day.  Labwork: None ordered.  Testing/Procedures: None ordered.  Follow-Up: Your physician recommends that you schedule a follow-up appointment in:   One Year with Chanetta Marshall, PA  Any Other Special Instructions Will Be Listed Below (If Applicable).     If you need a refill on your cardiac medications before your next appointment, please call your pharmacy.

## 2018-11-14 ENCOUNTER — Other Ambulatory Visit: Payer: Self-pay | Admitting: Nurse Practitioner

## 2018-11-14 ENCOUNTER — Ambulatory Visit: Payer: Medicare Other | Admitting: Nurse Practitioner

## 2018-12-21 DIAGNOSIS — R079 Chest pain, unspecified: Secondary | ICD-10-CM | POA: Diagnosis not present

## 2018-12-21 DIAGNOSIS — R112 Nausea with vomiting, unspecified: Secondary | ICD-10-CM | POA: Diagnosis not present

## 2018-12-21 DIAGNOSIS — E119 Type 2 diabetes mellitus without complications: Secondary | ICD-10-CM | POA: Diagnosis not present

## 2018-12-21 DIAGNOSIS — E785 Hyperlipidemia, unspecified: Secondary | ICD-10-CM

## 2018-12-21 DIAGNOSIS — R911 Solitary pulmonary nodule: Secondary | ICD-10-CM

## 2018-12-21 DIAGNOSIS — D6859 Other primary thrombophilia: Secondary | ICD-10-CM | POA: Diagnosis not present

## 2018-12-22 DIAGNOSIS — R079 Chest pain, unspecified: Secondary | ICD-10-CM | POA: Diagnosis not present

## 2018-12-22 DIAGNOSIS — D6859 Other primary thrombophilia: Secondary | ICD-10-CM | POA: Diagnosis not present

## 2018-12-22 DIAGNOSIS — E119 Type 2 diabetes mellitus without complications: Secondary | ICD-10-CM | POA: Diagnosis not present

## 2019-01-07 ENCOUNTER — Institutional Professional Consult (permissible substitution): Payer: Medicare Other | Admitting: Internal Medicine

## 2019-01-16 ENCOUNTER — Telehealth: Payer: Self-pay | Admitting: Internal Medicine

## 2019-01-16 NOTE — Telephone Encounter (Signed)
ATC both she and husband are both consults for tomorrow. I left a message stating recommendation is to reschedule appt. I would call back with further details.

## 2019-01-16 NOTE — Telephone Encounter (Signed)
Both appts have been cancelled and rescheduled. Pt aware to self quarrantinex 14 day. She is aware to go to ed should sx worsen. He daughter was exposed and was in her home helping her. Nothing further needed.

## 2019-01-16 NOTE — Telephone Encounter (Signed)
Ok to cancel both but let them know happy to see in 2 weeks and to ER in meantime if get a lot worse.  May need to wait until 2 days before ov for covid back

## 2019-01-17 ENCOUNTER — Institutional Professional Consult (permissible substitution): Payer: Medicare Other | Admitting: Internal Medicine

## 2019-01-23 ENCOUNTER — Encounter: Payer: Self-pay | Admitting: General Surgery

## 2019-01-24 ENCOUNTER — Telehealth: Payer: Self-pay

## 2019-01-24 ENCOUNTER — Telehealth: Payer: Self-pay | Admitting: Internal Medicine

## 2019-01-24 ENCOUNTER — Ambulatory Visit (INDEPENDENT_AMBULATORY_CARE_PROVIDER_SITE_OTHER): Payer: Medicare Other | Admitting: Gastroenterology

## 2019-01-24 ENCOUNTER — Other Ambulatory Visit: Payer: Self-pay

## 2019-01-24 DIAGNOSIS — D649 Anemia, unspecified: Secondary | ICD-10-CM

## 2019-01-24 DIAGNOSIS — Z7901 Long term (current) use of anticoagulants: Secondary | ICD-10-CM | POA: Diagnosis not present

## 2019-01-24 DIAGNOSIS — R194 Change in bowel habit: Secondary | ICD-10-CM | POA: Diagnosis not present

## 2019-01-24 DIAGNOSIS — R1310 Dysphagia, unspecified: Secondary | ICD-10-CM

## 2019-01-24 MED ORDER — SUPREP BOWEL PREP KIT 17.5-3.13-1.6 GM/177ML PO SOLN: 1.0000 | ORAL | 0 refills | Status: DC

## 2019-01-24 MED ORDER — OMEPRAZOLE 20 MG PO CPDR: 20.0000 mg | DELAYED_RELEASE_CAPSULE | Freq: Every day | ORAL | 3 refills | Status: DC

## 2019-01-24 NOTE — Patient Instructions (Addendum)
If you are age 74 or older, your body mass index should be between 23-30. Your There is no height or weight on file to calculate BMI. If this is out of the aforementioned range listed, please consider follow up with your Primary Care Provider.  If you are age 67 or younger, your body mass index should be between 19-25. Your There is no height or weight on file to calculate BMI. If this is out of the aformentioned range listed, please consider follow up with your Primary Care Provider.    You have been scheduled for an endoscopy and colonoscopy. Please follow the written instructions given to you at your visit today. Please pick up your prep supplies at the pharmacy within the next 1-3 days. If you use inhalers (even only as needed), please bring them with you on the day of your procedure. Your physician has requested that you go to www.startemmi.com and enter the access code given to you at your visit today. This web site gives a general overview about your procedure. However, you should still follow specific instructions given to you by our office regarding your preparation for the procedure.  We will contact Dr[facility]Steven Moll at [facility] in regards to holding your Coumadin x5 days prior to your procedure.    We have sent the following medications to your pharmacy for you to pick up at your convenience: Omeprazole   Thank you for choosing me and [facility].  Dr. Havery Moros

## 2019-01-24 NOTE — Telephone Encounter (Signed)
Spoke with Dr. Madalyn Rob RN, Caryl Pina,  at [facility]. She states pt brought in her home BP readings and it seems she is having symptomatic orthostatic hypotension. Dr. Jeryl Columbia wanted input regarding decreasing carvedilol to 6.25mg  bid.   I reviewed Dr Jackalyn Lombard last Joseph not with Caryl Pina, RN. Pt was taking 6.25mg  bid for PVC's, recently increased to 12.5mg  for HTN. She is to follow up with the EP NP annually and see him PRN. Pt's HTN to be managed by her PCP.  Caryl Pina stated she will let Dr Jeryl Columbia know and they will most likely decrease her carvedilol back to 6.25mg  bid. They will be faxing over recent OV notes for review as well.

## 2019-01-24 NOTE — Progress Notes (Signed)
Virtual Visit via Video Note  I connected with Pamela [name] on 01/24/19 at  1:30 PM EDT by a video enabled telemedicine application and verified that I am speaking with the correct person using two identifiers.   I discussed the limitations of evaluation and management by telemedicine and the availability of in person appointments. The patient expressed understanding and agreed to proceed.  THIS ENCOUNTER IS A VIRTUAL VISIT DUE TO COVID-19 - PATIENT WAS NOT SEEN IN THE OFFICE. PATIENT HAS CONSENTED TO VIRTUAL VISIT / TELEMEDICINE VISIT USING DOXIMITY   Location of patient: home Location of provider: office Name of referring provider:  Persons participating: myself, patient Time spent on call:  minutes   HPI :  73 y/o female with a history of protein S deficiency with DVT on Coumadin, AF, history of TIA, referred by Serita Grammes MD for anemia and changes in bowel habits.  Dark stools for the past month. She thinks stools are "black", but loose and watery and not tarry / sticky. She has had it intermittently in the past but this time it has persistent for the past few weeks. She does not see any red blood in the stools. She does have some dysphagia in her mid chest and can vomit the content out if it does not pass. Dysphagia ongoing for a month. Solid dysphagia only, no problems with liquids.No odynophagia. She has not had pyrosis. She denies much regurgitation. No postrpandial pains. No nausea or vomiting otherwise. No abdominal pains in general. She has been taking iron for the past 2 weeks, she thinks dark stools preceeds the iron use. She takes aspirin 81mg  / day. Tylenol PRN, no other NSAIDs.  She previously was on Zantac in the past for reflux, not taking it recently. Not taking any antacids.  She has had a prior EGD, she thinks 10-12 years ago or sooner at [facility], no report available.  She has had a colonoscopy, 10-12 years ago. No polyps removed she is aware of.  No FH of  colon cancer, esophageal cancer, or gastric cancer. Mother had history of blood clots, antithrombin III mutation.   Most recent labs from PCP's office shows Hgb of 11.7, MCV 88, Plt 265, WBC 6.7 Iron studies show iron level of 73, TIBC 329, iron sat 22%, B12 level of 253 Labs from 08/08/2018 show Hgb of 12.2, HCT 36, MCV 89, plt 275, WBC 11 Remote labs 02/2013 - Hgb 11.8   Past Medical History:  Diagnosis Date  . Chronic anticoagulation   . Diabetes mellitus   . DJD (degenerative joint disease)   . GERD (gastroesophageal reflux disease)   . HTN (hypertension)   . Hypercholesteremia   . Obesity   . Protein S deficiency ([place])    prior DVT L knee, L arm DVT,  followed by Dr West Pugh at [facility]  . PVC's (premature ventricular contractions)   . S/P cardiac catheterization 02/2013   Normal coronaries; low normal EF at 50%  . TIA (transient ischemic attack)      Past Surgical History:  Procedure Laterality Date  . CARPAL TUNNEL RELEASE  1994   bilateral  . CHOLECYSTECTOMY  1993  . left knee replacement  2006  . TONSILLECTOMY    . TOTAL ABDOMINAL HYSTERECTOMY  1988   Family History  Problem Relation Age of Onset  . Cirrhosis Mother   . Antithrombin III deficiency Mother        multiple emboli  . Ulcerative colitis Mother   .  Coronary artery disease Father   . Heart attack Father   . Hypertension Father   . Protein S deficiency Sister        prior DVT  . Protein S deficiency Daughter   . Hypertension Sister   . Hypertension Brother   . Heart attack Brother   . Heart attack Sister   . Stroke Neg Hx   . Colitis Neg Hx   . Colon polyps Neg Hx   . Esophageal cancer Neg Hx   . Liver cancer Neg Hx   . Pancreatic cancer Neg Hx   . Rectal cancer Neg Hx   . Stomach cancer Neg Hx    Social History   Tobacco Use  . Smoking status: Never Smoker  . Smokeless tobacco: Never Used  Substance Use Topics  . Alcohol use: No  . Drug use: No   Current Outpatient Medications   Medication Sig Dispense Refill  . aspirin 81 MG tablet Take 81 mg by mouth daily.    Marland Kitchen atorvastatin (LIPITOR) 20 MG tablet Take 20 mg by mouth daily.    Marland Kitchen azelastine (ASTELIN) 137 MCG/SPRAY nasal spray Place 2 sprays into the nose daily as needed for rhinitis or allergies.     . carvedilol (COREG) 12.5 MG tablet Take 1 tablet (12.5 mg total) by mouth 2 (two) times daily. 180 tablet 3  . Dulaglutide (TRULICITY) 1.5 GE/9.5MW SOPN Inject into the skin once a week.    . Insulin Glargine, 1 Unit Dial, (TOUJEO SOLOSTAR) 300 UNIT/ML SOPN Inject 10 Units into the skin daily.    Marland Kitchen ipratropium-albuterol (DUONEB) 0.5-2.5 (3) MG/3ML SOLN Take 3 mLs by nebulization 4 (four) times daily as needed. wheezing  0  . magnesium oxide (MAG-OX) 400 (241.3 MG) MG tablet Take 400 mg by mouth 2 (two) times daily.    . metFORMIN (GLUCOPHAGE) 1000 MG tablet Take 1,000 mg by mouth 2 (two) times daily with a meal.     . Multiple Vitamin (MULTI-VITAMIN DAILY PO) Take 1 tablet by mouth daily. Advanced Multi vitamin high fiber    . ranitidine (ZANTAC) 150 MG tablet Take 1 tablet by mouth daily.    . repaglinide (PRANDIN) 2 MG tablet Take 4 mg by mouth 3 (three) times daily before meals.     . solifenacin (VESICARE) 10 MG tablet Take 1 tablet by mouth daily.    Marland Kitchen telmisartan (MICARDIS) 80 MG tablet Take 1 tablet by mouth daily.    . TRADJENTA 5 MG TABS tablet Take 1 tablet by mouth daily.    . traZODone (DESYREL) 50 MG tablet Take 100 mg by mouth at bedtime.     Marland Kitchen warfarin (COUMADIN) 1 MG tablet Take 1 mg by mouth as directed.    . warfarin (COUMADIN) 5 MG tablet Take 5 mg by mouth daily.     No current facility-administered medications for this visit.    Allergies  Allergen Reactions  . Ketek [Telithromycin] Nausea And Vomiting and Rash  . Loxapine Succinate Hives  . Naproxen Sodium Anaphylaxis and Hives  . Amoxapine And Related Hives  . Darvon Nausea And Vomiting  . Amoxicillin Rash  . Propoxyphene Nausea And  Vomiting     Review of Systems: All systems reviewed and negative except where noted in HPI.    Labs as above  Physical Exam: NA   ASSESSMENT AND PLAN: 73 y/o female here for a new patient visit to discuss the following:  Dysphagia / Dark stools / Anemia / Anticoagulated -  one month duration of solid food dysphagia, as well as dark stools. Hgb just slightly below previous baseline, at 11.7, with normal iron stores. Possible dark stools could be caused by the iron but patient is adamant it started before she took the iron. Consistency does not sound typical for melena however. Discussed ddx for dysphagia with her and the dark stools. I am recommending an EGD to further evaluate this and potentially treat dysphagia with dilation pending findings. I discussed risks / benefits of EGD and anesthesia and she wanted to proceed. She will need to hold coumadin for 5 days prior to this exam, we will reach out to her Hematologist Dr. West Pugh in [place] to see if that is okay and if she warrants a lovenox bridge. At the same time, given she is overdue for colon cancer screening, I offered her a colonoscopy to be done at the same time, and she wished to proceed with that as well. Otherwise, given her dark stools and her last CBC was a month ago, recommend repeating it to ensure Hgb is stable. She states Dr. Jeryl Columbia did that today for her, I will reach out to her office to get those results to ensure stable. In the interim, she should not take Zantac given FDA recall, given dark stools will empirically given some omeprazole to take once daily until her procedure. She agreed.  Colon cancer screening - as above, proceeding with colonoscopy.   Meyer Cellar, MD [facility]  CC: Serita Grammes, MD

## 2019-01-24 NOTE — Telephone Encounter (Signed)
New Message   Pamela [name] is calling from [employer] and they want to decrease the pts Carvedilol because she is having Symptomatic orthostatic hypotension   They will be faxing the office notes and BP readings

## 2019-01-24 NOTE — Telephone Encounter (Signed)
Attn: Dr. West Pugh -[facility]           [phone]   Request for surgical clearance:     Endoscopy Procedure  What type of surgery is being performed?     Colonoscopy/Endoscopy   When is this surgery scheduled?     03/05/2019  What type of clearance is required ?   Pharmacy  Are there any medications that need to be held prior to surgery and how long? Coumadin x 5 days prior to procedure? Can you please advise if Lovenox Bridge is required?   Practice name and name of physician performing surgery?      [facility] -Dr Rigby Cellar   What is your office phone and fax number?      Phone- [phone]  Fax- [phone] Attn: Mary Sella   Anesthesia type (None, local, MAC, general) ?       MAC   ** request was sent via fax @ [phone]**

## 2019-02-01 ENCOUNTER — Telehealth: Payer: Self-pay | Admitting: Gastroenterology

## 2019-02-01 NOTE — Telephone Encounter (Signed)
Labs from Dr. Roberts Gaudy' office arrived from 01/24/19 Hgb stable at 11.8. MCV 88   We will await EGD and colonoscopy as scheduled.

## 2019-02-05 ENCOUNTER — Encounter: Payer: Self-pay | Admitting: Internal Medicine

## 2019-02-05 ENCOUNTER — Ambulatory Visit (INDEPENDENT_AMBULATORY_CARE_PROVIDER_SITE_OTHER): Payer: Medicare Other | Admitting: Internal Medicine

## 2019-02-05 ENCOUNTER — Other Ambulatory Visit: Payer: Self-pay

## 2019-02-05 VITALS — BP 154/60 | HR 87 | Temp 98.2°F | Ht 64.0 in | Wt 171.4 lb

## 2019-02-05 DIAGNOSIS — R0609 Other forms of dyspnea: Secondary | ICD-10-CM | POA: Diagnosis not present

## 2019-02-05 DIAGNOSIS — E871 Hypo-osmolality and hyponatremia: Secondary | ICD-10-CM

## 2019-02-05 DIAGNOSIS — R0600 Dyspnea, unspecified: Secondary | ICD-10-CM

## 2019-02-05 DIAGNOSIS — R911 Solitary pulmonary nodule: Secondary | ICD-10-CM | POA: Diagnosis not present

## 2019-02-05 LAB — CBC WITH DIFFERENTIAL/PLATELET
Basophils Absolute: 0 10*3/uL (ref 0.0–0.1)
Basophils Relative: 0.5 % (ref 0.0–3.0)
Eosinophils Absolute: 0.1 10*3/uL (ref 0.0–0.7)
Eosinophils Relative: 1 % (ref 0.0–5.0)
HCT: 35.4 % — ABNORMAL LOW (ref 36.0–46.0)
Hemoglobin: 12.2 g/dL (ref 12.0–15.0)
Lymphocytes Relative: 15.8 % (ref 12.0–46.0)
Lymphs Abs: 1.1 10*3/uL (ref 0.7–4.0)
MCHC: 34.6 g/dL (ref 30.0–36.0)
MCV: 89.2 fl (ref 78.0–100.0)
Monocytes Absolute: 0.6 10*3/uL (ref 0.1–1.0)
Monocytes Relative: 8.6 % (ref 3.0–12.0)
Neutro Abs: 5 10*3/uL (ref 1.4–7.7)
Neutrophils Relative %: 74.1 % (ref 43.0–77.0)
Platelets: 274 10*3/uL (ref 150.0–400.0)
RBC: 3.97 Mil/uL (ref 3.87–5.11)
RDW: 14.4 % (ref 11.5–15.5)
WBC: 6.7 10*3/uL (ref 4.0–10.5)

## 2019-02-05 LAB — BASIC METABOLIC PANEL
BUN: 18 mg/dL (ref 6–23)
CO2: 26 mEq/L (ref 19–32)
Calcium: 8.9 mg/dL (ref 8.4–10.5)
Chloride: 92 mEq/L — ABNORMAL LOW (ref 96–112)
Creatinine, Ser: 0.9 mg/dL (ref 0.40–1.20)
GFR: 61.14 mL/min (ref >60.00)
Glucose, Bld: 98 mg/dL (ref 70–99)
Potassium: 4.9 mEq/L (ref 3.5–5.1)
Sodium: 126 mEq/L — ABNORMAL LOW (ref 135–145)

## 2019-02-05 LAB — TSH: TSH: 1.24 u[IU]/mL (ref 0.35–4.50)

## 2019-02-05 LAB — BRAIN NATRIURETIC PEPTIDE: Pro B Natriuretic peptide (BNP): 89 pg/mL (ref 0.0–100.0)

## 2019-02-05 MED ORDER — PANTOPRAZOLE SODIUM 40 MG PO TBEC: 40.0000 mg | DELAYED_RELEASE_TABLET | Freq: Every day | ORAL | 2 refills | Status: DC

## 2019-02-05 MED ORDER — FAMOTIDINE 20 MG PO TABS: ORAL_TABLET | ORAL | 11 refills | Status: DC

## 2019-02-05 NOTE — Progress Notes (Signed)
Pamela [name], female    DOB: 03-05-1946,   MRN: [id number]   Brief patient profile:  83 yowf never smoker eval prior to Epic for lung nodules eval for CP at [facility] on 12/21/2018 or non-pleuritic  cp assoc with hbp > referred back to Dr Rayann Heman with an incidental finding of a LLL nodule on CT so referred to pulmonary clinic 02/05/2019 by Dr   Jeryl Columbia      History of Present Illness  02/05/2019  Pulmonary/ 1st office eval/  Chief Complaint  Patient presents with  . Consult    consult for lung nodule and doe  Dyspnea:  Indolent  Onset x 6 m  prior to OV  = gives out walking at grocery store at [facility] with assoc gen chest discomfort "every time" feels better at rest w/in15 min - says Dr Joylene Grapes knows all about and never occurs at rest but also has developed a different type of chest discomfort "like food getting stuck" x one m when eats since off zantac and f/u Dr Havery Moros also scheduled Cough: denies  Sleep: fine flat  SABA use: none unless during bad uri and hasn't needed in "years"  Ex cp x 6 months off zantac    No obvious day to day or daytime variability or assoc excess/ purulent sputum or mucus plugs or hemoptysis or   subjective wheeze or overt sinus or hb symptoms.   Sleeps as above without nocturnal  or early am exacerbation  of respiratory  c/o's or need for noct saba. Also denies any obvious fluctuation of symptoms with weather or environmental changes or other aggravating or alleviating factors except as outlined above   No unusual exposure hx or h/o childhood pna/ asthma or knowledge of premature birth.  Current Allergies, Complete Past Medical History, Past Surgical History, Family History, and Social History were reviewed in [employer] record.  ROS  The following are not active complaints unless bolded Hoarseness, sore throat, dysphagia, dental problems, itching, sneezing,  nasal congestion or discharge of excess mucus or purulent secretions,  ear ache,   fever, chills, sweats, unintended wt loss or wt gain, classically pleuritic     cp,  orthopnea pnd or arm/hand swelling  or leg swelling, presyncope, palpitations, abdominal pain, anorexia, nausea, vomiting, diarrhea  or change in bowel habits or change in bladder habits, change in stools or change in urine, dysuria, hematuria,  rash, arthralgias, visual complaints, headache, numbness, weakness or ataxia or problems with walking or coordination,  change in mood or  memory.            Past Medical History:  Diagnosis Date  . Chronic anticoagulation   . Diabetes mellitus   . DJD (degenerative joint disease)   . GERD (gastroesophageal reflux disease)   . HTN (hypertension)   . Hypercholesteremia   . Obesity   . Protein S deficiency ([place])    prior DVT L knee, L arm DVT,  followed by Dr West Pugh at [facility]  . PVC's (premature ventricular contractions)   . S/P cardiac catheterization 02/2013   Normal coronaries; low normal EF at 50%  . TIA (transient ischemic attack)     Outpatient Medications Prior to Visit  Medication Sig Dispense Refill  . aspirin 81 MG tablet Take 81 mg by mouth daily.    Marland Kitchen atorvastatin (LIPITOR) 20 MG tablet Take 20 mg by mouth daily.    Marland Kitchen azelastine (ASTELIN) 137 MCG/SPRAY nasal spray Place 2 sprays into the  nose daily as needed for rhinitis or allergies.     . carvedilol (COREG) 12.5 MG tablet Take 1 tablet (12.5 mg total) by mouth 2 (two) times daily. 180 tablet 3  . Dulaglutide (TRULICITY) 1.5 VV/7.4MO SOPN Inject into the skin once a week.    . Insulin Glargine, 1 Unit Dial, (TOUJEO SOLOSTAR) 300 UNIT/ML SOPN Inject 10 Units into the skin daily.    Marland Kitchen ipratropium-albuterol (DUONEB) 0.5-2.5 (3) MG/3ML SOLN Take 3 mLs by nebulization 4 (four) times daily as needed. wheezing  0  . magnesium oxide (MAG-OX) 400 (241.3 MG) MG tablet Take 400 mg by mouth 2 (two) times daily.    . metFORMIN (GLUCOPHAGE) 1000 MG tablet Take 1,000 mg by mouth 2 (two) times daily  with a meal.     . Multiple Vitamin (MULTI-VITAMIN DAILY PO) Take 1 tablet by mouth daily. Advanced Multi vitamin high fiber    . omeprazole (PRILOSEC) 20 MG capsule Take 1 capsule (20 mg total) by mouth daily. 90 capsule 3  . repaglinide (PRANDIN) 2 MG tablet Take 4 mg by mouth 3 (three) times daily before meals.     . TRADJENTA 5 MG TABS tablet Take 1 tablet by mouth daily.    . traZODone (DESYREL) 50 MG tablet Take 100 mg by mouth at bedtime.     Marland Kitchen warfarin (COUMADIN) 1 MG tablet Take 1 mg by mouth as directed.    . warfarin (COUMADIN) 5 MG tablet Take 5 mg by mouth daily.    Manus Gunning BOWEL PREP KIT 17.5-3.13-1.6 GM/177ML SOLN Take 1 kit by mouth as directed. For colonoscopy prep (Patient not taking: Reported on 02/05/2019) 2 Bottle 0  .       . solifenacin (VESICARE) 10 MG tablet Take 1 tablet by mouth daily.    .           Objective:     BP (!) 154/60 (BP Location: Left Arm, Patient Position: Sitting, Cuff Size: Normal)   Pulse 87   Temp 98.2 F (36.8 C)   Ht '5\' 4"'  (1.626 m)   Wt 171 lb 6.4 oz (77.7 kg)   SpO2 98%   BMI 29.42 kg/m   SpO2: 98 %   RA  amb wf somewhat of a belle affect demeanor "my doctors all know about my problems"  HEENT: nl dentition, turbinates bilaterally, and oropharynx. Nl external ear canals without cough reflex   NECK :  without JVD/Nodes/TM/ nl carotid upstrokes bilaterally   LUNGS: no acc muscle use,  Nl contour chest which is clear to A and P bilaterally without cough on insp or exp maneuvers   CV:  RRR  no s3 or murmur or increase in P2, and no edema   ABD:  soft and nontender with nl inspiratory excursion in the supine position. No bruits or organomegaly appreciated, bowel sounds nl  MS:  Nl gait/ ext warm without deformities, calf tenderness, cyanosis or clubbing No obvious joint restrictions   SKIN: warm and dry without lesions    NEURO:  alert, approp, nl sensorium with  no motor or cerebellar deficits apparent.      I  personally reviewed images and agree with radiology impression as follows:   Chest CTa  12/21/18  LLL nodule s change since 2015  No other lung findings   Labs ordered/ reviewed:      Chemistry      Component Value Date/Time   NA 126 (L) 02/05/2019 1226   K 4.9 02/05/2019  1226   CL 92 (L) 02/05/2019 1226   CO2 26 02/05/2019 1226   BUN 18 02/05/2019 1226   CREATININE 0.90 02/05/2019 1226      Component Value Date/Time   CALCIUM 8.9 02/05/2019 1226        Lab Results  Component Value Date   WBC 6.7 02/05/2019   HGB 12.2 02/05/2019   HCT 35.4 (L) 02/05/2019   MCV 89.2 02/05/2019   PLT 274.0 02/05/2019        Lab Results  Component Value Date   TSH 1.24 02/05/2019     Lab Results  Component Value Date   PROBNP 89.0 02/05/2019               Assessment   DOE (dyspnea on exertion) Onset early 2020  - 02/05/2019   Walked RA  2 laps @  approx 286f each @ nl pace  stopped due to  Light headed, no sp or sob, no desats    Symptoms are markedly disproportionate to objective findings and not clear to what extent this is actually a pulmonary  problem but pt does appear to have difficult to sort out respiratory symptoms of unknown origin for which  DDX  = almost all start with A and  include Adherence, Ace Inhibitors, Acid Reflux, Active Sinus Disease, Alpha 1 Antitripsin deficiency, Anxiety masquerading as Airways dz,  ABPA,  Allergy(esp in young), Aspiration (esp in elderly), Adverse effects of meds,  Active smoking or Vaping, A bunch of PE's/clot burden (a few small clots can't cause this syndrome unless there is already severe underlying pulm or vascular dz with poor reserve),  Anemia or thyroid disorder, plus two Bs  = Bronchiectasis and Beta blocker use..and one C= CHF     Adherence is always the initial "prime suspect" and is a multilayered concern that requires a "trust but verify" approach in every patient - starting with knowing how to use medications, especially  inhalers, correctly, keeping up with refills and understanding the fundamental difference between maintenance and prns vs those medications only taken for a very short course and then stopped and not refilled.   ? Acid (or non-acid) GERD > always difficult to exclude as up to 75% of pts in some series report no assoc GI/ Heartburn symptoms> rec continue max (24h)  acid suppression and diet restrictions/ reviewed   > f/u GI planned   ? Anxiety/depression/ deconditioning >  usually at the bottom of this list of usual suspects but should be much higher on this pt's based on H and P and note already on psychotropics and may interfere with adherence and also interpretation of response or lack thereof to symptom management which can be quite subjective.   ? A bunch of PE's > CTa neg   ? Adverse drug effects > none of the usual suspects listed (assuming list is correct)  ? Allergy/asthma > unlikely with out assoc cough/ variability / noct or am symptoms though has used saba during uri's in past    ? CHF > ruled out though the report of reproducible ex cp is suggestive of Angina and has not reported this symptom to Dr ARayann Hemanas of last of 11/12/18 and suggested she do so.     Solitary pulmonary nodule on lung CT First noted 01/13/2014 > no change as of 12/21/2018  Meets radiographic criteria of benign, no f/u needed  CT results reviewed with pt >>> Too small for PET or bx, not suspicious enough for excisional  bx > really only option for now is follow the Fleischner society guidelines as rec by radiology= no f/u needed     Hyponatremia Na 126 on no obvious meds to cause it with last na 133 in 2014   Based on absence of known ca and absence of symptoms as well as likely  chronicity this is most likely just a 'reset osmostat" and for now rec  Avoid excess fluids Repeat in 2 weeks and let PCP refer for w/u if indicated       Total time devoted to counseling  > 50 % of initial 60 min office  visit:  reviewed case with pt/ directly observed portions of ambulatory 02 saturation study/  discussion of options/alternatives/ personally creating written customized instructions  in presence of pt  then going over those specific  Instructions directly with the pt including how to use all of the meds but in particular covering each new medication in detail and the difference between the maintenance= "automatic" meds and the prns using an action plan format for the latter (If this problem/symptom => do that organization reading Left to right).  Please see AVS from this visit for a full list of these instructions which I personally wrote for this pt and  are unique to this visit.      Christinia Gully, MD 02/05/2019

## 2019-02-05 NOTE — Patient Instructions (Addendum)
Pantoprazole (protonix) 40 mg   Take  30-60 min before first meal of the day and Pepcid (famotidine)  20 mg one after supper until return to office - this is the best way to tell whether stomach acid is contributing to your problem.    GERD (REFLUX)  is an extremely common cause of respiratory symptoms just like yours , many times with no obvious heartburn at all.    It can be treated with medication, but also with lifestyle changes including elevation of the head of your bed (ideally with 6 -8inch blocks under the headboard of your bed),  Smoking cessation, avoidance of late meals, excessive alcohol, and avoid fatty foods, chocolate, peppermint, colas, red wine, and acidic juices such as orange juice.  NO MINT OR MENTHOL PRODUCTS SO NO COUGH DROPS  USE SUGARLESS CANDY INSTEAD (Jolley ranchers or Stover's or Life Savers) or even ice chips will also do - the key is to swallow to prevent all throat clearing. NO OIL BASED VITAMINS - use powdered substitutes.  Avoid fish oil when coughing.    Please remember to go to the lab department  for your tests - we will call you with the results when they are available.   I will send my thoughts to Dr Rayann Heman and your PCP your shortness of breath and chest discomfort

## 2019-02-06 ENCOUNTER — Encounter: Payer: Self-pay | Admitting: Internal Medicine

## 2019-02-06 DIAGNOSIS — R911 Solitary pulmonary nodule: Secondary | ICD-10-CM

## 2019-02-06 DIAGNOSIS — E871 Hypo-osmolality and hyponatremia: Secondary | ICD-10-CM | POA: Insufficient documentation

## 2019-02-06 HISTORY — DX: Solitary pulmonary nodule: R91.1

## 2019-02-06 HISTORY — DX: Hypo-osmolality and hyponatremia: E87.1

## 2019-02-06 NOTE — Telephone Encounter (Signed)
Faxed multiple requests to [phone] to Gareth Morgan, hematology.  See letter.  Called and left multiple messages at [phone] for nurse to call back to discuss.

## 2019-02-06 NOTE — Assessment & Plan Note (Addendum)
Onset early 2020  - 02/05/2019   Walked RA  2 laps @  approx 253ft each @ nl pace  stopped due to  Light headed, no sp or sob, no desats    Symptoms are markedly disproportionate to objective findings and not clear to what extent this is actually a pulmonary  problem but pt does appear to have difficult to sort out respiratory symptoms of unknown origin for which  DDX  = almost all start with A and  include Adherence, Ace Inhibitors, Acid Reflux, Active Sinus Disease, Alpha 1 Antitripsin deficiency, Anxiety masquerading as Airways dz,  ABPA,  Allergy(esp in young), Aspiration (esp in elderly), Adverse effects of meds,  Active smoking or Vaping, A bunch of PE's/clot burden (a few small clots can't cause this syndrome unless there is already severe underlying pulm or vascular dz with poor reserve),  Anemia or thyroid disorder, plus two Bs  = Bronchiectasis and Beta blocker use..and one C= CHF     Adherence is always the initial "prime suspect" and is a multilayered concern that requires a "trust but verify" approach in every patient - starting with knowing how to use medications, especially inhalers, correctly, keeping up with refills and understanding the fundamental difference between maintenance and prns vs those medications only taken for a very short course and then stopped and not refilled.   ? Acid (or non-acid) GERD > always difficult to exclude as up to 75% of pts in some series report no assoc GI/ Heartburn symptoms> rec continue max (24h)  acid suppression and diet restrictions/ reviewed   > f/u GI planned   ? Anxiety/depression/ deconditioning >  usually at the bottom of this list of usual suspects but should be much higher on this pt's based on H and P and note already on psychotropics and may interfere with adherence and also interpretation of response or lack thereof to symptom management which can be quite subjective.   ? A bunch of PE's > CTa neg   ? Adverse drug effects > none of the  usual suspects listed (assuming list is correct)  ? Allergy/asthma > unlikely with out assoc cough/ variability / noct or am symptoms though has used saba during uri's in past    ? CHF > ruled out though the report of reproducible ex cp is suggestive of Angina and has not reported this symptom to Dr Rayann Heman as of last of 11/12/18 and suggested she do so.

## 2019-02-06 NOTE — Progress Notes (Signed)
Spoke with pt and notified of results per Dr. Wert. Pt verbalized understanding and denied any questions. 

## 2019-02-06 NOTE — Assessment & Plan Note (Signed)
First noted 01/13/2014 > no change as of 12/21/2018  Meets radiographic criteria of benign, no f/u needed  CT results reviewed with pt >>> Too small for PET or bx, not suspicious enough for excisional bx > really only option for now is follow the Fleischner society guidelines as rec by radiology= no f/u needed    Total time devoted to counseling  > 50 % of initial 60 min office visit:  reviewed case with pt/ directly observed portions of ambulatory 02 saturation study/  discussion of options/alternatives/ personally creating written customized instructions  in presence of pt  then going over those specific  Instructions directly with the pt including how to use all of the meds but in particular covering each new medication in detail and the difference between the maintenance= "automatic" meds and the prns using an action plan format for the latter (If this problem/symptom => do that organization reading Left to right).  Please see AVS from this visit for a full list of these instructions which I personally wrote for this pt and  are unique to this visit.

## 2019-02-06 NOTE — Telephone Encounter (Signed)
Rec'd reply from [facility]ematology that Dr. Joan Flores does not manage the patient's Coumadin.  Contacted the patient and was given the name of her PCP - Dr. Serita Grammes at [facility] in [place].  Faxed Anti coag clearance letter to Dr. Jeryl Columbia at [phone]

## 2019-02-06 NOTE — Assessment & Plan Note (Addendum)
Na 126 on no obvious meds to cause it with last na 133 in 2014   Based on absence of known ca and absence of symptoms as well as likely  chronicity this is most likely just a 'reset osmostat" and for now rec  Avoid excess fluids Repeat in 2 weeks and let PCP refer for w/u if indicated

## 2019-02-11 NOTE — Telephone Encounter (Signed)
LM for nurse from Dr. Madalyn Rob office to call regarding clearance to hold Coumadin prior to procedure on 03-05-2019.

## 2019-02-15 NOTE — Telephone Encounter (Signed)
Called and verified they rec'd the fax I sent on 6-10.    6-12: Refaxed request to Dr. Jeryl Columbia  (3rd request) for pt to hold Coumadin for procedure on 03-05-2019.

## 2019-02-18 ENCOUNTER — Telehealth: Payer: Self-pay

## 2019-02-18 NOTE — Telephone Encounter (Signed)
Have Not rec'd clearance from Dr. Jeryl Columbia. Faxed another request today and provided alternate fax number: [phone] or [phone].

## 2019-02-18 NOTE — Telephone Encounter (Signed)
Called and requested an answer to the request to hold Coumadin for 5 days prior to procedure.  Anderson Malta indicated she had faxed it. We have not received it.  She indicated she will refax to me today.

## 2019-02-19 NOTE — Telephone Encounter (Signed)
Dr. Madalyn Rob office called back--stated that it's OK for clearance as long as [facility] manages pt.  [phone]

## 2019-02-19 NOTE — Telephone Encounter (Signed)
As of today we have still not rec'd clearance from Dr. Madalyn Rob office.  Called and left another message for the nurse to call me and to fax clearance to [phone].

## 2019-02-20 NOTE — Telephone Encounter (Signed)
See phone other phone note.  Cleared to hold Coumadin.  Pt informed.

## 2019-02-20 NOTE — Telephone Encounter (Signed)
Called and spoke to [place], Nurse for Dr. Jeryl Columbia.  She indicated that Dr. Jeryl Columbia has cleared the patient to hold Coumadin for 5 days prior to procedure. Fax received. They will reach out to the patient if Dr. Jeryl Columbia deems that she will need a Lovenox bridge.  Called pt and let her know to hold Coumadin 5 days prior to procedure which has been moved to 7-10.  She will start holding on 7-4.  I let her know that Dr. Madalyn Rob office will contact her if they deem she will need a Lovenox bridge. She expressed understanding.

## 2019-03-05 ENCOUNTER — Encounter: Payer: Medicare Other | Admitting: Gastroenterology

## 2019-03-14 ENCOUNTER — Telehealth: Payer: Self-pay

## 2019-03-14 ENCOUNTER — Telehealth: Payer: Self-pay | Admitting: Gastroenterology

## 2019-03-14 NOTE — Telephone Encounter (Signed)
Returned patients call. No answer. Was not able to leave a message. Will try to call her again in a few minutes.   Riki Sheer, LPN ( Admitting )     Dr. Havery Moros,  I spoke with Pamela [name] regarding several issues that she having prior to her procedure. Pamela [name] states that her Blood Pressure has been erratic the past couple of days. The patient saw her physician and she changed her medication and now it seems to be leveling out. Last readings were 176/86 /126/59 and 140/78. Her second problem is she drank the first part of her prep at 7:00 Am this morning. Patient states she vomited  a couple of hours later. I told her she was supposed to start her prep at 6:00 Pm and the second part was to start 5 hours before her procedure. Please advise on this patient. Thanks!   Riki Sheer, LPN ( Admitting )

## 2019-03-14 NOTE — Telephone Encounter (Signed)

## 2019-03-14 NOTE — Telephone Encounter (Signed)
Patient called wanting to speak with the nurse in regards to schedule procedure [email or web address]

## 2019-03-14 NOTE — Telephone Encounter (Signed)
Patient was called to report Dr. Doyne Keel instructions. Patient verbalizes instructions for Miralax this evening and the second bottle of Suprep in the morning 5 hours prior to her procedure.  Riki Sheer, LPN ( admitting )

## 2019-03-14 NOTE — Telephone Encounter (Signed)
I think she is okay to proceed as scheduled.  Please counsel her on how to do a Miralax prep - do 1/2 Miralax prep this evening in place of what she has already drank. She can do the second half of the regular prep in the AM tomorrow or second half of the miralax if she does not tolerate the other prep. Thanks

## 2019-03-15 ENCOUNTER — Ambulatory Visit (AMBULATORY_SURGERY_CENTER): Payer: Medicare Other | Admitting: Gastroenterology

## 2019-03-15 ENCOUNTER — Encounter: Payer: Self-pay | Admitting: Gastroenterology

## 2019-03-15 ENCOUNTER — Other Ambulatory Visit: Payer: Self-pay

## 2019-03-15 VITALS — BP 170/74 | HR 73 | Temp 97.3°F | Resp 14 | Ht 64.0 in | Wt 171.0 lb

## 2019-03-15 DIAGNOSIS — K648 Other hemorrhoids: Secondary | ICD-10-CM

## 2019-03-15 DIAGNOSIS — D123 Benign neoplasm of transverse colon: Secondary | ICD-10-CM | POA: Diagnosis not present

## 2019-03-15 DIAGNOSIS — K5730 Diverticulosis of large intestine without perforation or abscess without bleeding: Secondary | ICD-10-CM | POA: Diagnosis not present

## 2019-03-15 DIAGNOSIS — K2970 Gastritis, unspecified, without bleeding: Secondary | ICD-10-CM

## 2019-03-15 DIAGNOSIS — K449 Diaphragmatic hernia without obstruction or gangrene: Secondary | ICD-10-CM

## 2019-03-15 DIAGNOSIS — K5710 Diverticulosis of small intestine without perforation or abscess without bleeding: Secondary | ICD-10-CM

## 2019-03-15 DIAGNOSIS — Z1211 Encounter for screening for malignant neoplasm of colon: Secondary | ICD-10-CM

## 2019-03-15 DIAGNOSIS — K2950 Unspecified chronic gastritis without bleeding: Secondary | ICD-10-CM | POA: Diagnosis not present

## 2019-03-15 DIAGNOSIS — K3189 Other diseases of stomach and duodenum: Secondary | ICD-10-CM

## 2019-03-15 DIAGNOSIS — R194 Change in bowel habit: Secondary | ICD-10-CM

## 2019-03-15 DIAGNOSIS — R1310 Dysphagia, unspecified: Secondary | ICD-10-CM

## 2019-03-15 DIAGNOSIS — K222 Esophageal obstruction: Secondary | ICD-10-CM

## 2019-03-15 MED ORDER — SODIUM CHLORIDE 0.9 % IV SOLN: 500.0000 mL | Freq: Once | INTRAVENOUS | Status: DC

## 2019-03-15 NOTE — Op Note (Signed)
[facility] Patient Name: Pamela [name] Procedure Date: 03/15/2019 9:28 AM MRN: [id number] Endoscopist: Remo Lipps P. Havery Moros , MD Age: 73 Referring MD:  Date of Birth: Sep 10, 1945 Gender: Female Account #: [id number] Procedure:                Colonoscopy Indications:              Screening for colorectal malignant neoplasm,                            patient also previously complained of dark stools,                            on Coumadin, mild anemia Medicines:                Monitored Anesthesia Care Procedure:                Pre-Anesthesia Assessment:                           - Prior to the procedure, a History and Physical                            was performed, and patient medications and                            allergies were reviewed. The patient's tolerance of                            previous anesthesia was also reviewed. The risks                            and benefits of the procedure and the sedation                            options and risks were discussed with the patient.                            All questions were answered, and informed consent                            was obtained. Prior Anticoagulants: The patient has                            taken Coumadin (warfarin), last dose was 5 days                            prior to procedure. ASA Grade Assessment: III - A                            patient with severe systemic disease. After                            reviewing the risks and benefits, the patient was  deemed in satisfactory condition to undergo the                            procedure.                           After obtaining informed consent, the colonoscope                            was passed under direct vision. Throughout the                            procedure, the patient's blood pressure, pulse, and                            oxygen saturations were monitored continuously. The            Colonoscope was introduced through the anus and                            advanced to the the terminal ileum, with                            identification of the appendiceal orifice and IC                            valve. The colonoscopy was technically difficult                            and complex due to significant looping. The patient                            tolerated the procedure well. The quality of the                            bowel preparation was adequate. The terminal ileum,                            ileocecal valve, appendiceal orifice, and rectum                            were photographed. Scope In: 9:52:53 AM Scope Out: 10:18:21 AM Scope Withdrawal Time: 0 hours 14 minutes 13 seconds  Total Procedure Duration: 0 hours 25 minutes 28 seconds  Findings:                 The perianal and digital rectal examinations were                            normal.                           The terminal ileum appeared normal.                           A 3 mm polyp was found in the transverse colon.  The                            polyp was sessile. The polyp was removed with a                            cold snare. Resection and retrieval were complete.                           Multiple small-mouthed diverticula were found in                            the left colon.                           The colon was tortuous which prolonged the                            procedure.                           Internal hemorrhoids were found during retroflexion.                           The exam was otherwise without abnormality. Complications:            No immediate complications. Estimated blood loss:                            Minimal. Estimated Blood Loss:     Estimated blood loss was minimal. Impression:               - The examined portion of the ileum was normal.                           - One 3 mm polyp in the transverse colon, removed                            with  a cold snare. Resected and retrieved.                           - Diverticulosis in the left colon.                           - Tortuous colon.                           - Internal hemorrhoids.                           - The examination was otherwise normal. Recommendation:           - Patient has a contact number available for                            emergencies. The signs and symptoms of potential  delayed complications were discussed with the                            patient. Return to normal activities tomorrow.                            Written discharge instructions were provided to the                            patient.                           - Resume previous diet.                           - Continue present medications.                           - Resume Coumadin today                           - Await pathology results. Remo Lipps P. Havery Moros, MD 03/15/2019 10:22:27 AM This report has been signed electronically.

## 2019-03-15 NOTE — Progress Notes (Signed)
Called to room to assist during endoscopic procedure.  Patient ID and intended procedure confirmed with present staff. Received instructions for my participation in the procedure from the performing physician.  

## 2019-03-15 NOTE — Progress Notes (Signed)
A and O x3. Report to RN. Tolerated MAC anesthesia well.Teeth unchanged after procedure.

## 2019-03-15 NOTE — Patient Instructions (Signed)
Please read handouts provided. Resume Coumadin today. Await pathology results. Follow Dilation Diet. Continue present medications.       YOU HAD AN ENDOSCOPIC PROCEDURE TODAY AT [facility]:   Refer to the procedure report that was given to you for any specific questions about what was found during the examination.  If the procedure report does not answer your questions, please call your gastroenterologist to clarify.  If you requested that your care partner not be given the details of your procedure findings, then the procedure report has been included in a sealed envelope for you to review at your convenience later.  YOU SHOULD EXPECT: Some feelings of bloating in the abdomen. Passage of more gas than usual.  Walking can help get rid of the air that was put into your GI tract during the procedure and reduce the bloating. If you had a lower endoscopy (such as a colonoscopy or flexible sigmoidoscopy) you may notice spotting of blood in your stool or on the toilet paper. If you underwent a bowel prep for your procedure, you may not have a normal bowel movement for a few days.  Please Note:  You might notice some irritation and congestion in your nose or some drainage.  This is from the oxygen used during your procedure.  There is no need for concern and it should clear up in a day or so.  SYMPTOMS TO REPORT IMMEDIATELY:   Following lower endoscopy (colonoscopy or flexible sigmoidoscopy):  Excessive amounts of blood in the stool  Significant tenderness or worsening of abdominal pains  Swelling of the abdomen that is new, acute  Fever of 100F or higher   Following upper endoscopy (EGD)  Vomiting of blood or coffee ground material  New chest pain or pain under the shoulder blades  Painful or persistently difficult swallowing  New shortness of breath  Fever of 100F or higher  Black, tarry-looking stools  For urgent or emergent issues, a gastroenterologist can be  reached at any hour by calling [phone].   DIET:  Drink plenty of fluids but you should avoid alcoholic beverages for 24 hours.  ACTIVITY:  You should plan to take it easy for the rest of today and you should NOT DRIVE or use heavy machinery until tomorrow (because of the sedation medicines used during the test).    FOLLOW UP: Our staff will call the number listed on your records 48-72 hours following your procedure to check on you and address any questions or concerns that you may have regarding the information given to you following your procedure. If we do not reach you, we will leave a message.  We will attempt to reach you two times.  During this call, we will ask if you have developed any symptoms of COVID 19. If you develop any symptoms (ie: fever, flu-like symptoms, shortness of breath, cough etc.) before then, please call [phone].  If you test positive for Covid 19 in the 2 weeks post procedure, please call and report this information to [place].    If any biopsies were taken you will be contacted by phone or by letter within the next 1-3 weeks.  Please call us at [phone] if you have not heard about the biopsies in 3 weeks.    SIGNATURES/CONFIDENTIALITY: You and/or your care partner have signed paperwork which will be entered into your electronic medical record.  These signatures attest to the fact that that the information above on your After Visit Summary has  been reviewed and is understood.  Full responsibility of the confidentiality of this discharge information lies with you and/or your care-partner.

## 2019-03-15 NOTE — Op Note (Addendum)
[facility] Patient Name: Pamela [name] Procedure Date: 03/15/2019 9:29 AM MRN: [id number] Endoscopist: Remo Lipps P. Havery Moros , MD Age: 73 Referring MD:  Date of Birth: [birth year] Gender: Female Account #: [id number] Procedure:                Upper GI endoscopy Indications:              Dysphagia, history of "dark stools", mild anemia                            with normal iron levels / stores, on Coumadin Medicines:                Monitored Anesthesia Care Procedure:                Pre-Anesthesia Assessment:                           - Prior to the procedure, a History and Physical                            was performed, and patient medications and                            allergies were reviewed. The patient's tolerance of                            previous anesthesia was also reviewed. The risks                            and benefits of the procedure and the sedation                            options and risks were discussed with the patient.                            All questions were answered, and informed consent                            was obtained. Prior Anticoagulants: The patient has                            taken Coumadin (warfarin), last dose was 5 days                            prior to procedure. ASA Grade Assessment: III - A                            patient with severe systemic disease. After                            reviewing the risks and benefits, the patient was                            deemed in satisfactory condition to undergo the  procedure.                           After obtaining informed consent, the endoscope was                            passed under direct vision. Throughout the                            procedure, the patient's blood pressure, pulse, and                            oxygen saturations were monitored continuously. The                            Endoscope was introduced through the  mouth, and                            advanced to the second part of duodenum. The upper                            GI endoscopy was accomplished without difficulty.                            The patient tolerated the procedure well. Scope In: Scope Out: Findings:                 Esophagogastric landmarks were identified: the                            Z-line was found at 37 cm, the gastroesophageal                            junction was found at 38 cm and the upper extent of                            the gastric folds was found at 40 cm from the                            incisors.                           A 2 cm hiatal hernia was present.                           The Z-line was slightly irregular and was found 37                            cm from the incisors, with 2 areas of extension of                            salmon colored mucosa roughly 37mm above the normal  SCJ. Biopsies were taken with a cold forceps for                            histology.                           The exam of the esophagus was otherwise normal. No                            obvious stenosis / stricrture noted.                           A guidewire was placed and the scope was withdrawn.                            Empiric dilation was performed in the entire                            esophagus with a Savary dilator with moderate                            resistance at 17 mm and 18 mm. Relook endoscopy                            showed no mucosal wrents.                           Patchy mild inflammation characterized by erythema                            was found in the gastric body and in the gastric                            antrum, along with atrophic appearing mucosa,                            without focal ulceration. Biopsies were taken with                            a cold forceps for Helicobacter pylori testing of                            the antrum and body  / incisura.                           The exam of the stomach was otherwise normal.                           The duodenal bulb and second portion of the                            duodenum were normal other than a diverticulum in  the second portion. Complications:            No immediate complications. Estimated blood loss:                            Minimal. Estimated Blood Loss:     Estimated blood loss was minimal. Impression:               - Esophagogastric landmarks identified.                           - 2 cm hiatal hernia.                           - Z-line irregular, 37 cm from the incisors.                            Biopsied.                           - Empiric dilation performed to 50mm.                           - Gastritis / atrophic mucosa. Biopsied.                           - Duodenal diverticulum                           - Normal duodenal bulb and second portion of the                            duodenum otherwise. Recommendation:           - Patient has a contact number available for                            emergencies. The signs and symptoms of potential                            delayed complications were discussed with the                            patient. Return to normal activities tomorrow.                            Written discharge instructions were provided to the                            patient.                           - Resume previous diet.                           - Continue present medications.                           - Resume Coumadin  today                           - Await pathology results. Remo Lipps P. Havery Moros, MD 03/15/2019 10:28:33 AM This report has been signed electronically.

## 2019-03-15 NOTE — Progress Notes (Signed)
Pt's states no medical or surgical changes since previsit or office visit. 

## 2019-03-19 ENCOUNTER — Telehealth: Payer: Self-pay | Admitting: *Deleted

## 2019-03-19 NOTE — Telephone Encounter (Signed)
  Follow up Call-  Call back number 03/15/2019  Post procedure Call Back phone  # [phone]  Permission to leave phone message Yes  Some recent data might be hidden     Patient questions:  Do you have a fever, pain , or abdominal swelling? No. Pain Score  0 *  Have you tolerated food without any problems? Yes.    Have you been able to return to your normal activities? Yes.    Do you have any questions about your discharge instructions: Diet   No. Medications  No. Follow up visit  No.  Do you have questions or concerns about your Care? No.  Actions: * If pain score is 4 or above: No action needed, pain <4.  1. Have you developed a fever since your procedure? no  2.   Have you had an respiratory symptoms (SOB or cough) since your procedure? no  3.   Have you tested positive for COVID 19 since your procedure no  4.   Have you had any family members/close contacts diagnosed with the COVID 19 since your procedure?  no   If yes to any of these questions please route to Joylene John, RN and Alphonsa Gin, Therapist, sports.

## 2019-05-01 LAB — HM DEXA SCAN

## 2019-10-01 ENCOUNTER — Telehealth: Payer: Self-pay

## 2019-10-03 ENCOUNTER — Telehealth (INDEPENDENT_AMBULATORY_CARE_PROVIDER_SITE_OTHER): Payer: Medicare PPO | Admitting: Internal Medicine

## 2019-10-03 ENCOUNTER — Encounter: Payer: Self-pay | Admitting: Internal Medicine

## 2019-10-03 VITALS — BP 155/67 | HR 61 | Ht 64.0 in | Wt 173.0 lb

## 2019-10-03 DIAGNOSIS — I10 Essential (primary) hypertension: Secondary | ICD-10-CM

## 2019-10-03 DIAGNOSIS — I951 Orthostatic hypotension: Secondary | ICD-10-CM

## 2019-10-03 DIAGNOSIS — I493 Ventricular premature depolarization: Secondary | ICD-10-CM

## 2019-10-03 DIAGNOSIS — D6859 Other primary thrombophilia: Secondary | ICD-10-CM | POA: Diagnosis not present

## 2019-10-03 MED ORDER — AMLODIPINE BESYLATE 2.5 MG PO TABS: 2.5000 mg | ORAL_TABLET | Freq: Every day | ORAL | 3 refills | Status: DC

## 2019-10-03 NOTE — Progress Notes (Signed)
Electrophysiology TeleHealth Note   Due to national recommendations of social distancing due to COVID 19, an audio/video telehealth visit is felt to be most appropriate for this patient at this time.  See MyChart message from today for the patient's consent to telehealth for [facility].  Date:  10/03/2019   ID:  Pamela [name], DOB 01/26/1946, MRN [id number]  Location: patient's home  Provider location:  [facility]  Evaluation Performed: Follow-up visit  PCP:  Serita Grammes, MD   Electrophysiologist:  Dr Rayann Heman  Chief Complaint:  dizziness  History of Present Illness:    Pamela [name] is a 74 y.o. female who presents via telehealth conferencing today.  Since last being seen in our clinic, the patient reports doing reasonably well.  She has been found by her PCP to have orthostatic dizziness.  She finds that she is dizzy with positional changes.  She has SOB with activity.  Today, she denies symptoms of palpitations, chest pain,   lower extremity edema, or syncope.  The patient is otherwise without complaint today.  The patient denies symptoms of fevers, chills, cough, or new SOB worrisome for COVID 19.  Past Medical History:  Diagnosis Date  . Chronic anticoagulation   . Diabetes mellitus   . DJD (degenerative joint disease)   . GERD (gastroesophageal reflux disease)   . HTN (hypertension)   . Hypercholesteremia   . Obesity   . Protein S deficiency ([place])    prior DVT L knee, L arm DVT,  followed by Dr West Pugh at [facility]  . PVC's (premature ventricular contractions)   . S/P cardiac catheterization 02/2013   Normal coronaries; low normal EF at 50%  . TIA (transient ischemic attack)     Past Surgical History:  Procedure Laterality Date  . CARPAL TUNNEL RELEASE  1994   bilateral  . CHOLECYSTECTOMY  1993  . left knee replacement  2006  . thumb surgery Left 2018  . TONSILLECTOMY    . TOTAL ABDOMINAL HYSTERECTOMY  1988    Current Outpatient Medications   Medication Sig Dispense Refill  . albuterol (VENTOLIN HFA) 108 (90 Base) MCG/ACT inhaler Inhale 1 puff into the lungs as needed for shortness of breath.    Marland Kitchen amLODipine (NORVASC) 2.5 MG tablet Take 1 tablet by mouth daily.    Marland Kitchen aspirin EC 81 MG tablet Take 81 mg by mouth daily.    Marland Kitchen atorvastatin (LIPITOR) 40 MG tablet Take 40 mg by mouth daily.    Marland Kitchen azelastine (ASTELIN) 137 MCG/SPRAY nasal spray Place 2 sprays into the nose daily as needed for rhinitis or allergies.     . carvedilol (COREG) 6.25 MG tablet Take 6.25 mg by mouth 2 (two) times daily with a meal.    . Dulaglutide (TRULICITY) 1.5 0000000 SOPN Inject into the skin once a week.    . estradiol (ESTRACE) 0.1 MG/GM vaginal cream Place 1 Applicatorful vaginally as directed.    . famotidine (PEPCID) 20 MG tablet One after supper 30 tablet 11  . furosemide (LASIX) 20 MG tablet Take 1 tablet by mouth daily.    . Insulin Glargine, 1 Unit Dial, (TOUJEO SOLOSTAR) 300 UNIT/ML SOPN Inject 10 Units into the skin daily.    Marland Kitchen ipratropium-albuterol (DUONEB) 0.5-2.5 (3) MG/3ML SOLN Take 3 mLs by nebulization 4 (four) times daily as needed. wheezing  0  . lisinopril (ZESTRIL) 2.5 MG tablet Take 1 tablet by mouth daily.    . magnesium oxide (MAG-OX) 400 (241.3 MG)  MG tablet Take 400 mg by mouth 2 (two) times daily.    . ondansetron (ZOFRAN-ODT) 8 MG disintegrating tablet Take 1 tablet by mouth as needed for nausea/vomiting.    . pantoprazole (PROTONIX) 40 MG tablet Take 1 tablet (40 mg total) by mouth daily. Take 30-60 min before first meal of the day 30 tablet 2  . pioglitazone (ACTOS) 30 MG tablet Take 1 tablet by mouth daily.    . promethazine-dextromethorphan (PROMETHAZINE-DM) 6.25-15 MG/5ML syrup Take 1 mL by mouth as needed for cough.    . solifenacin (VESICARE) 10 MG tablet Take 1 tablet by mouth daily.    . TRADJENTA 5 MG TABS tablet Take 1 tablet by mouth daily.    . traMADol (ULTRAM) 50 MG tablet Take 1 tablet by mouth as needed for pain.     . traZODone (DESYREL) 50 MG tablet Take 100 mg by mouth at bedtime.     Marland Kitchen warfarin (COUMADIN) 1 MG tablet Take 1 mg by mouth as directed.    . warfarin (COUMADIN) 5 MG tablet Take 5 mg by mouth daily.     No current facility-administered medications for this visit.    Allergies:   Ketek [telithromycin], Loxapine succinate, Naproxen sodium, Amoxapine and related, Darvon, Amoxicillin, and Propoxyphene   Social History:  The patient  reports that she has never smoked. She has never used smokeless tobacco. She reports that she does not drink alcohol or use drugs.   Family History:  The patient's family history includes Antithrombin III deficiency in her mother; Cirrhosis in her mother; Coronary artery disease in her father; Heart attack in her brother, father, and sister; Hypertension in her brother, father, and sister; Protein S deficiency in her daughter and sister; Ulcerative colitis in her mother.   ROS:  Please see the history of present illness.   All other systems are personally reviewed and negative.    Exam:    Vital Signs:  BP (!) 155/67   Pulse 61   Ht 5\' 4"  (1.626 m)   Wt 173 lb (78.5 kg)   BMI 29.70 kg/m   Well sounding and appearing, alert and conversant, regular work of breathing,  good skin color Eyes- anicteric, neuro- grossly intact, skin- no apparent rash or lesions or cyanosis, mouth- oral mucosa is pink  Labs/Other Tests and Data Reviewed:    Recent Labs: 02/05/2019: BUN 18; Creatinine, Ser 0.90; Hemoglobin 12.2; Platelets 274.0; Potassium 4.9; Pro B Natriuretic peptide (BNP) 89.0; Sodium 126; TSH 1.24   Wt Readings from Last 3 Encounters:  10/03/19 173 lb (78.5 kg)  03/15/19 171 lb (77.6 kg)  02/05/19 171 lb 6.4 oz (77.7 kg)     ASSESSMENT & PLAN:    1.  HTN She worries about this quite a bit BPs are elevated and she checks them frequently This is complicated by postural dizziness and low bp She has also had low sodium on lasix Given orthostatic  hypotension, I will stop lasix Adequate hydration and support hose are encouraged Restart norvasc 2.5mg  daily No other changes at this time  2. PVCs Asymptomatic Stable  3. ? afib She is on coumadin and ASA for protein S deficiency.  No symptoms of arrhythmia  Follow-up: general cardiology in [place] for BP management going forward I will see when needed   Patient Risk:  after full review of this patients clinical status, I feel that they are at moderate risk at this time.  Today, I have spent 15 minutes with the patient  with telehealth technology discussing arrhythmia management .    Army Fossa, MD  10/03/2019 8:42 AM     [address] [place] [id number] [phone] (office) [phone] (fax)

## 2019-10-03 NOTE — Addendum Note (Signed)
Addended by: Stanton Kidney on: 10/03/2019 09:09 AM   Modules accepted: Orders

## 2019-11-11 DIAGNOSIS — Z6830 Body mass index (BMI) 30.0-30.9, adult: Secondary | ICD-10-CM | POA: Diagnosis not present

## 2019-11-11 DIAGNOSIS — Z9181 History of falling: Secondary | ICD-10-CM | POA: Diagnosis not present

## 2019-11-11 DIAGNOSIS — Z1331 Encounter for screening for depression: Secondary | ICD-10-CM | POA: Diagnosis not present

## 2019-11-11 DIAGNOSIS — G459 Transient cerebral ischemic attack, unspecified: Secondary | ICD-10-CM | POA: Diagnosis not present

## 2019-11-11 DIAGNOSIS — Z7901 Long term (current) use of anticoagulants: Secondary | ICD-10-CM | POA: Diagnosis not present

## 2019-11-11 DIAGNOSIS — G4700 Insomnia, unspecified: Secondary | ICD-10-CM | POA: Diagnosis not present

## 2019-11-11 DIAGNOSIS — I509 Heart failure, unspecified: Secondary | ICD-10-CM | POA: Diagnosis not present

## 2019-11-11 DIAGNOSIS — E1165 Type 2 diabetes mellitus with hyperglycemia: Secondary | ICD-10-CM | POA: Diagnosis not present

## 2019-11-14 ENCOUNTER — Encounter: Payer: Self-pay | Admitting: Cardiology

## 2019-11-14 ENCOUNTER — Ambulatory Visit (INDEPENDENT_AMBULATORY_CARE_PROVIDER_SITE_OTHER): Payer: Medicare PPO | Admitting: Cardiology

## 2019-11-14 ENCOUNTER — Other Ambulatory Visit: Payer: Self-pay

## 2019-11-14 ENCOUNTER — Ambulatory Visit (INDEPENDENT_AMBULATORY_CARE_PROVIDER_SITE_OTHER): Payer: Medicare PPO

## 2019-11-14 VITALS — BP 120/56 | HR 64 | Ht 64.0 in | Wt 173.0 lb

## 2019-11-14 DIAGNOSIS — R0600 Dyspnea, unspecified: Secondary | ICD-10-CM | POA: Diagnosis not present

## 2019-11-14 DIAGNOSIS — I10 Essential (primary) hypertension: Secondary | ICD-10-CM

## 2019-11-14 DIAGNOSIS — Z7901 Long term (current) use of anticoagulants: Secondary | ICD-10-CM

## 2019-11-14 DIAGNOSIS — R0609 Other forms of dyspnea: Secondary | ICD-10-CM

## 2019-11-14 DIAGNOSIS — R002 Palpitations: Secondary | ICD-10-CM

## 2019-11-14 DIAGNOSIS — I4891 Unspecified atrial fibrillation: Secondary | ICD-10-CM

## 2019-11-14 DIAGNOSIS — D6859 Other primary thrombophilia: Secondary | ICD-10-CM

## 2019-11-14 DIAGNOSIS — I951 Orthostatic hypotension: Secondary | ICD-10-CM | POA: Diagnosis not present

## 2019-11-14 NOTE — Patient Instructions (Signed)
Medication Instructions:  Your physician has recommended you make the following change in your medication:   STOP: Amlodipine  *If you need a refill on your cardiac medications before your next appointment, please call your pharmacy*   Lab Work: Your physician recommends that you return for lab work in: TODAY TSH  If you have labs (blood work) drawn today and your tests are completely normal, you will receive your results only by: Marland Kitchen MyChart Message (if you have MyChart) OR . A paper copy in the mail If you have any lab test that is abnormal or we need to change your treatment, we will call you to review the results.   Testing/Procedures: Your physician has requested that you have an echocardiogram. Echocardiography is a painless test that uses sound waves to create images of your heart. It provides your doctor with information about the size and shape of your heart and how well your heart's chambers and valves are working. This procedure takes approximately one hour. There are no restrictions for this procedure.  A zio monitor was ordered today. It will remain on for 7 days. You will then return monitor and event diary in provided box. It takes 1-2 weeks for report to be downloaded and returned to [place]. We will call you with the results. If monitor falls off or has orange flashing light, please call Zio for further instructions.      Follow-Up: At [facility], you and your health needs are our priority.  As part of our continuing mission to provide you with exceptional heart care, we have created designated Provider Care Teams.  These Care Teams include your primary Cardiologist (physician) and Advanced Practice Providers (APPs -  Physician Assistants and Nurse Practitioners) who all work together to provide you with the care you need, when you need it.  We recommend signing up for the patient portal called "MyChart".  Sign up information is provided on this After Visit Summary.   MyChart is used to connect with patients for Virtual Visits (Telemedicine).  Patients are able to view lab/test results, encounter notes, upcoming appointments, etc.  Non-urgent messages can be sent to your provider as well.   To learn more about what you can do with MyChart, go to [email or web address].    Your next appointment:   1 month(s)  The format for your next appointment:   In Person  Provider:   Berniece Salines, DO   Other Instructions

## 2019-11-14 NOTE — Progress Notes (Signed)
Cardiology Office Note:    Date:  11/14/2019   ID:  Pamela [name], DOB [birth year], MRN [id number]  PCP:  Serita Grammes, MD  Cardiologist:  Berniece Salines, DO  Electrophysiologist:  None   Referring MD: Serita Grammes, MD   Chief Complaint  Patient presents with  . Follow-up    History of Present Illness:    Pamela [name] is a 74 y.o. 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 The patient was last seen by EP on 10/03/2019 in a video visit.  She did elevated blood pressure and was placed on amlodipine 2.5 mg daily.  Patient tells me that she has had issues with orthostatic hypotension and has been suffering for this for many years.  She reports that her blood pressure is usually in the 150s 160s and when she stands up her blood pressure drops to the 100s and 90s.  She has had multiple episodes in the past where she had syncope events related to this.  In addition she tells me that she is experiencing significant palpitations now with dizziness and generalized fatigue.  Worried that she is not herself and is concerned about this.  Past Medical History:  Diagnosis Date  . Chronic anticoagulation   . Diabetes mellitus   . DJD (degenerative joint disease)   . GERD (gastroesophageal reflux disease)   . HTN (hypertension)   . Hypercholesteremia   . Obesity   . Protein S deficiency ([place])    prior DVT L knee, L arm DVT,  followed by Dr West Pugh at [facility]  . PVC's (premature ventricular contractions)   . S/P cardiac catheterization 02/2013   Normal coronaries; low normal EF at 50%  . TIA (transient ischemic attack)     Past Surgical History:  Procedure Laterality Date  . CARPAL TUNNEL RELEASE  1994   bilateral  . CHOLECYSTECTOMY  1993  . left knee replacement  2006   . thumb surgery Left 2018  . TONSILLECTOMY    . TOTAL ABDOMINAL HYSTERECTOMY  1988    Current Medications: Current Meds  Medication Sig  . albuterol (VENTOLIN HFA) 108 (90 Base) MCG/ACT inhaler Inhale 1 puff into the lungs as needed for shortness of breath.  Marland Kitchen aspirin EC 81 MG tablet Take 81 mg by mouth daily.  Marland Kitchen atorvastatin (LIPITOR) 40 MG tablet Take 40 mg by mouth daily.  Marland Kitchen azelastine (ASTELIN) 137 MCG/SPRAY nasal spray Place 2 sprays into the nose daily as needed for rhinitis or allergies.   . carvedilol (COREG) 6.25 MG tablet Take 6.25 mg by mouth 2 (two) times daily with a meal.  . Dulaglutide (TRULICITY) 1.5 WN/0.2VO SOPN Inject into the skin once a week.  . estradiol (ESTRACE) 0.1 MG/GM vaginal cream Place 1 Applicatorful vaginally as directed.  . ferrous sulfate 324 MG TBEC Take 324 mg by mouth.  . Insulin Glargine, 1 Unit Dial, (TOUJEO SOLOSTAR) 300 UNIT/ML SOPN Inject 10 Units into the skin daily.  Marland Kitchen ipratropium-albuterol (DUONEB) 0.5-2.5 (3) MG/3ML SOLN Take 3 mLs by nebulization 4 (four) times daily as needed. wheezing  . lisinopril (ZESTRIL) 2.5 MG tablet Take 1 tablet by mouth daily.  . magnesium oxide (MAG-OX) 400 (241.3 MG) MG tablet Take 400 mg by mouth 2 (two) times daily.  . ondansetron (ZOFRAN-ODT) 8 MG disintegrating tablet Take 1 tablet by mouth as needed for  nausea/vomiting.  . pantoprazole (PROTONIX) 40 MG tablet Take 1 tablet (40 mg total) by mouth daily. Take 30-60 min before first meal of the day  . pioglitazone (ACTOS) 30 MG tablet Take 1 tablet by mouth daily.  . promethazine-dextromethorphan (PROMETHAZINE-DM) 6.25-15 MG/5ML syrup Take 1 mL by mouth as needed for cough.  . tolterodine (DETROL LA) 2 MG 24 hr capsule 2 mg daily.  . traMADol (ULTRAM) 50 MG tablet Take 1 tablet by mouth as needed for pain.  . traZODone (DESYREL) 50 MG tablet Take 100 mg by mouth at bedtime.   Marland Kitchen warfarin (COUMADIN) 1 MG tablet Take 1 mg by mouth as directed.  . warfarin  (COUMADIN) 5 MG tablet Take 5 mg by mouth daily.  . [DISCONTINUED] amLODipine (NORVASC) 2.5 MG tablet Take 1 tablet (2.5 mg total) by mouth daily.     Allergies:   Ketek [telithromycin], Loxapine succinate, Naproxen sodium, Amoxapine and related, Darvon, Amoxicillin, and Propoxyphene   Social History   Socioeconomic History  . Marital status: Married    Spouse name: [name]  . Number of children: Not on file  . Years of education: Not on file  . Highest education level: Not on file  Occupational History  . Not on file  Tobacco Use  . Smoking status: Never Smoker  . Smokeless tobacco: Never Used  Substance and Sexual Activity  . Alcohol use: No  . Drug use: No  . Sexual activity: Not on file  Other Topics Concern  . Not on file  Social History Narrative   Lives with spouse in [place].  2 grown daughters.   Retired Glass blower/designer     Social Determinants of Radio broadcast assistant Strain:   . Difficulty of Paying Living Expenses:   Food Insecurity:   . Worried About Charity fundraiser in the Last Year:   . Arboriculturist in the Last Year:   Transportation Needs:   . Film/video editor (Medical):   Marland Kitchen Lack of Transportation (Non-Medical):   Physical Activity:   . Days of Exercise per Week:   . Minutes of Exercise per Session:   Stress:   . Feeling of Stress :   Social Connections:   . Frequency of Communication with Friends and Family:   . Frequency of Social Gatherings with Friends and Family:   . Attends Religious Services:   . Active Member of Clubs or Organizations:   . Attends Archivist Meetings:   Marland Kitchen Marital Status:      Family History: The patient's family history includes Antithrombin III deficiency in her mother; Cirrhosis in her mother; Coronary artery disease in her father; Heart attack in her brother, father, and sister; Hypertension in her brother, father, and sister; Protein S deficiency in her daughter and sister; Ulcerative colitis  in her mother. There is no history of Stroke, Colitis, Colon polyps, Esophageal cancer, Liver cancer, Pancreatic cancer, Rectal cancer, or Stomach cancer.  ROS:   Review of Systems  Constitution: Reports generalized fatigue.  Negative for decreased appetite, fever and weight gain.  HENT: Negative for congestion, ear discharge, hoarse voice and sore throat.   Eyes: Negative for discharge, redness, vision loss in right eye and visual halos.  Cardiovascular: Reports palpitations orthostatic hypotension.  Negative for chest pain, dyspnea on exertion, leg swelling, orthopnea.  Respiratory: Negative for cough, hemoptysis, shortness of breath and snoring.   Endocrine: Negative for heat intolerance and polyphagia.  Hematologic/Lymphatic: Negative for bleeding problem. Does  not bruise/bleed easily.  Skin: Negative for flushing, nail changes, rash and suspicious lesions.  Musculoskeletal: Negative for arthritis, joint pain, muscle cramps, myalgias, neck pain and stiffness.  Gastrointestinal: Negative for abdominal pain, bowel incontinence, diarrhea and excessive appetite.  Genitourinary: Negative for decreased libido, genital sores and incomplete emptying.  Neurological: Negative for brief paralysis, focal weakness, headaches and loss of balance.  Psychiatric/Behavioral: Negative for altered mental status, depression and suicidal ideas.  Allergic/Immunologic: Negative for HIV exposure and persistent infections.    EKGs/Labs/Other Studies Reviewed:    The following studies were reviewed today:   EKG:  The ekg ordered today demonstrates sinus rhythm, heart rate 64 bpm, nonspecific ST changes.  TTE Study Conclusions  - Left ventricle: The cavity size was mildly dilated. Wall  thickness was increased in a pattern of mild LVH. Systolic  function was normal. The estimated ejection fraction was in the  range of 50% to 55%. Doppler parameters are consistent with  abnormal left ventricular  relaxation (grade 1 diastolic  dysfunction).  - Left atrium: The atrium was mildly dilated.  - Atrial septum: No defect or patent foramen ovale was identified.   Pharmacologic stress test April 2020 normal with no reversible ischemia.  Recent Labs: 02/05/2019: BUN 18; Creatinine, Ser 0.90; Hemoglobin 12.2; Platelets 274.0; Potassium 4.9; Pro B Natriuretic peptide (BNP) 89.0; Sodium 126; TSH 1.24   EKG: September 10, 2019 BC: WBC 7.2, globin 12.2, hematocrit 36, platelet 233 Iron 28% TIBC Impression: Glucose 255, BUN 23, creatinine 1.0, total bili 2.5, AST 52, ALT 52, alk phos 55, calcium 86, sodium 129 4.3, chloride 93, bicarb 31, total protein 5.6 Albumin 3.6, globulin 2.0 Vitamin B12 455, magnesium 1.9 Hemoglobin A1c 6.4  Recent Lipid Panel Lipid profile on October 30, 2019: Triglyceride 47, total cholesterol 96, HDL 45, LDL 42  Physical Exam:    VS:  BP (!) 120/56 (BP Location: Right Arm, Patient Position: Sitting, Cuff Size: Normal)   Pulse 64   Ht '5\' 4"'  (1.626 m)   Wt 173 lb (78.5 kg)   SpO2 99%   BMI 29.70 kg/m     Wt Readings from Last 3 Encounters:  11/14/19 173 lb (78.5 kg)  10/03/19 173 lb (78.5 kg)  03/15/19 171 lb (77.6 kg)     GEN: Well nourished, well developed in no acute distress HEENT: Normal NECK: No JVD; No carotid bruits LYMPHATICS: No lymphadenopathy CARDIAC: S1S2 noted,RRR, no murmurs, rubs, gallops RESPIRATORY:  Clear to auscultation without rales, wheezing or rhonchi  ABDOMEN: Soft, non-tender, non-distended, +bowel sounds, no guarding. EXTREMITIES: No edema, No cyanosis, no clubbing MUSCULOSKELETAL:  No deformity  SKIN: Warm and dry NEUROLOGIC:  Alert and oriented x 3, non-focal PSYCHIATRIC:  Normal affect, good insight  ASSESSMENT:    1. DOE (dyspnea on exertion)   2. Essential hypertension   3. Atrial fibrillation, unspecified type (HCC)   4. Palpitations   5. Orthostatic hypotension   6. Chronic anticoagulation   7. Protein S  deficiency ([place])    PLAN:     The patient does have significant orthostatic hypotension asymptomatic.  Was able to review current blood pressure from home which shows sitting blood pressure in [phone] systolics however drops down systolic between 832-91 bpm. She is worried because at that time she is very symptomatic.  There may be multiple process going on with this patient: Orthostatic hypotension in the setting of antihypertensive medication, autonomic dysfunction, with diabetic dysautonomia.    For now I would like to avoid worsening  her symptoms she tells me since her last medication change it has gotten slightly worsened.  I will stop her amlodipine for now.  Prefer to keep the patient on a slightly higher blood pressure sitting to avoid significant hypotension she is standing up.  This patient is in a very difficult situation as well.  Otherwise compression stockings.  We may consider addition of midodrine and/or Florinef if there is no improvement.  The meantime since she is complaining of shortness of breath as well as palpitations I would like to rule out a cardiovascular etiology of this palpitation, therefore at this time I would like to placed a zio patch for  7 days. In additon a transthoracic echocardiogram will be ordered to assess LV/RV function and any structural abnormalities. Once these testing have been performed amd reviewed further reccomendations will be made. For now, I do reccomend that the patient goes to the nearest ED if  symptoms recur.  Reviewed her echocardiogram from 2016 there is no valvular abnormalities likely to pursue an ischemic evaluation as she recently had a normal pharmacologic stress test.  She is significantly fatigue vitamin B12 level was normal at her PCP labs and currently get a TSH for completeness.  She also has daytime somnolence patient we discuss possibility of sleep study as well.  Continue patient on her carvedilol given history of atrial  fibrillation.  Continue chronic anticoagulation for history of atrial fibrillation and protein S deficiency.  The patient is in agreement with the above plan. The patient left the office in stable condition.  The patient will follow up in 1 month or sooner if needed.   Medication Adjustments/Labs and Tests Ordered: Current medicines are reviewed at length with the patient today.  Concerns regarding medicines are outlined above.  Orders Placed This Encounter  Procedures  . TSH  . LONG TERM MONITOR (3-14 DAYS)  . EKG 12-Lead  . ECHOCARDIOGRAM COMPLETE   No orders of the defined types were placed in this encounter.   Patient Instructions  Medication Instructions:  Your physician has recommended you make the following change in your medication:   STOP: Amlodipine  *If you need a refill on your cardiac medications before your next appointment, please call your pharmacy*   Lab Work: Your physician recommends that you return for lab work in: TODAY TSH  If you have labs (blood work) drawn today and your tests are completely normal, you will receive your results only by: Marland Kitchen MyChart Message (if you have MyChart) OR . A paper copy in the mail If you have any lab test that is abnormal or we need to change your treatment, we will call you to review the results.   Testing/Procedures: Your physician has requested that you have an echocardiogram. Echocardiography is a painless test that uses sound waves to create images of your heart. It provides your doctor with information about the size and shape of your heart and how well your heart's chambers and valves are working. This procedure takes approximately one hour. There are no restrictions for this procedure.  A zio monitor was ordered today. It will remain on for 7 days. You will then return monitor and event diary in provided box. It takes 1-2 weeks for report to be downloaded and returned to [place]. We will call you with the results. If  monitor falls off or has orange flashing light, please call Zio for further instructions.      Follow-Up: At [facility], you and your health needs  are our priority.  As part of our continuing mission to provide you with exceptional heart care, we have created designated Provider Care Teams.  These Care Teams include your primary Cardiologist (physician) and Advanced Practice Providers (APPs -  Physician Assistants and Nurse Practitioners) who all work together to provide you with the care you need, when you need it.  We recommend signing up for the patient portal called "MyChart".  Sign up information is provided on this After Visit Summary.  MyChart is used to connect with patients for Virtual Visits (Telemedicine).  Patients are able to view lab/test results, encounter notes, upcoming appointments, etc.  Non-urgent messages can be sent to your provider as well.   To learn more about what you can do with MyChart, go to [email or web address].    Your next appointment:   1 month(s)  The format for your next appointment:   In Person  Provider:   Berniece Salines, DO   Other Instructions      Adopting a Healthy Lifestyle.  Know what a healthy weight is for you (roughly BMI <25) and aim to maintain this   Aim for 7+ servings of fruits and vegetables daily   65-80+ fluid ounces of water or unsweet tea for healthy kidneys   Limit to max 1 drink of alcohol per day; avoid smoking/tobacco   Limit animal fats in diet for cholesterol and heart health - choose grass fed whenever available   Avoid highly processed foods, and foods high in saturated/trans fats   Aim for low stress - take time to unwind and care for your mental health   Aim for 150 min of moderate intensity exercise weekly for heart health, and weights twice weekly for bone health   Aim for 7-9 hours of sleep daily   When it comes to diets, agreement about the perfect plan isnt easy to find, even among the  experts. Experts at the [employer] developed an idea known as the Healthy Eating Plate. Just imagine a plate divided into logical, healthy portions.   The emphasis is on diet quality:   Load up on vegetables and fruits - one-half of your plate: Aim for color and variety, and remember that potatoes dont count.   Go for whole grains - one-quarter of your plate: Whole wheat, barley, wheat berries, quinoa, oats, brown rice, and foods made with them. If you want pasta, go with whole wheat pasta.   Protein power - one-quarter of your plate: Fish, chicken, beans, and nuts are all healthy, versatile protein sources. Limit red meat.   The diet, however, does go beyond the plate, offering a few other suggestions.   Use healthy plant oils, such as olive, canola, soy, corn, sunflower and peanut. Check the labels, and avoid partially hydrogenated oil, which have unhealthy trans fats.   If youre thirsty, drink water. Coffee and tea are good in moderation, but skip sugary drinks and limit milk and dairy products to one or two daily servings.   The type of carbohydrate in the diet is more important than the amount. Some sources of carbohydrates, such as vegetables, fruits, whole grains, and beans-are healthier than others.   Finally, stay active  Signed, Berniece Salines, DO  11/14/2019 12:31 PM    [facility]

## 2019-11-15 ENCOUNTER — Encounter: Payer: Self-pay | Admitting: Cardiology

## 2019-11-15 LAB — TSH: TSH: 2.02 u[IU]/mL (ref 0.450–4.500)

## 2019-11-29 DIAGNOSIS — R002 Palpitations: Secondary | ICD-10-CM | POA: Diagnosis not present

## 2019-12-04 DIAGNOSIS — I10 Essential (primary) hypertension: Secondary | ICD-10-CM | POA: Diagnosis not present

## 2019-12-04 DIAGNOSIS — E1165 Type 2 diabetes mellitus with hyperglycemia: Secondary | ICD-10-CM | POA: Diagnosis not present

## 2019-12-09 ENCOUNTER — Telehealth: Payer: Self-pay | Admitting: Cardiology

## 2019-12-09 NOTE — Telephone Encounter (Signed)
New Message ° ° ° °Pt is returning call  ° ° ° °Please call back  °

## 2019-12-09 NOTE — Telephone Encounter (Signed)
Lets watch the symptoms for now- when I see you we can discuss options.

## 2019-12-09 NOTE — Telephone Encounter (Signed)
I spoke with patient and gave her information from Dr Harriet Masson  Patient will follow up as planned

## 2019-12-09 NOTE — Telephone Encounter (Signed)
Pamela Salines, DO  12/05/2019 8:21 PM EDT    Your monitor showed evidence of beats that started from the top of the heart. He did not feel these. But he also did have occasional PVCs-this could be the reason for your palpitations. You were started on Coreg please let me know if the symptoms has improved    I spoke with patient and reviewed monitor results with her. She reports she has been on Coreg for a year or so and it has not helped a lot with palpitations. She continues to have the same amount of palpitations

## 2019-12-17 DIAGNOSIS — Z7901 Long term (current) use of anticoagulants: Secondary | ICD-10-CM | POA: Diagnosis not present

## 2019-12-17 DIAGNOSIS — D6851 Activated protein C resistance: Secondary | ICD-10-CM | POA: Diagnosis not present

## 2019-12-30 DIAGNOSIS — D649 Anemia, unspecified: Secondary | ICD-10-CM | POA: Diagnosis not present

## 2019-12-30 DIAGNOSIS — D6859 Other primary thrombophilia: Secondary | ICD-10-CM | POA: Diagnosis not present

## 2019-12-31 ENCOUNTER — Other Ambulatory Visit: Payer: Self-pay

## 2019-12-31 ENCOUNTER — Ambulatory Visit (INDEPENDENT_AMBULATORY_CARE_PROVIDER_SITE_OTHER): Payer: Medicare PPO

## 2019-12-31 DIAGNOSIS — R0600 Dyspnea, unspecified: Secondary | ICD-10-CM | POA: Diagnosis not present

## 2019-12-31 NOTE — Progress Notes (Signed)
Complete echocardiogram has been performed.  Jimmy Daltyn Degroat RDCS, RVT 

## 2020-01-06 ENCOUNTER — Other Ambulatory Visit: Payer: Self-pay

## 2020-01-07 ENCOUNTER — Encounter: Payer: Self-pay | Admitting: Cardiology

## 2020-01-07 ENCOUNTER — Other Ambulatory Visit: Payer: Self-pay

## 2020-01-07 ENCOUNTER — Telehealth: Payer: Self-pay | Admitting: Cardiology

## 2020-01-07 ENCOUNTER — Ambulatory Visit (INDEPENDENT_AMBULATORY_CARE_PROVIDER_SITE_OTHER): Payer: Medicare PPO | Admitting: Cardiology

## 2020-01-07 VITALS — BP 170/80 | HR 69 | Ht 64.0 in | Wt 175.0 lb

## 2020-01-07 DIAGNOSIS — I493 Ventricular premature depolarization: Secondary | ICD-10-CM | POA: Diagnosis not present

## 2020-01-07 DIAGNOSIS — G909 Disorder of the autonomic nervous system, unspecified: Secondary | ICD-10-CM | POA: Diagnosis not present

## 2020-01-07 DIAGNOSIS — D6859 Other primary thrombophilia: Secondary | ICD-10-CM

## 2020-01-07 DIAGNOSIS — I480 Paroxysmal atrial fibrillation: Secondary | ICD-10-CM

## 2020-01-07 DIAGNOSIS — R42 Dizziness and giddiness: Secondary | ICD-10-CM | POA: Diagnosis not present

## 2020-01-07 DIAGNOSIS — I951 Orthostatic hypotension: Secondary | ICD-10-CM | POA: Diagnosis not present

## 2020-01-07 DIAGNOSIS — I10 Essential (primary) hypertension: Secondary | ICD-10-CM

## 2020-01-07 MED ORDER — CARVEDILOL 12.5 MG PO TABS
12.5000 mg | ORAL_TABLET | Freq: Two times a day (BID) | ORAL | 3 refills | Status: DC
Start: 2020-01-07 — End: 2020-05-26

## 2020-01-07 NOTE — Telephone Encounter (Signed)
I am looking through this patients chart and I see where her amlodipine was stopped on 11/14/19. However when the patient was seen today 01/07/20 the medication was back on her med-list and nothing was said about stopping it. I am going to route this to Dr. Harriet Masson to see if she wants this medication to be stopped or not.

## 2020-01-07 NOTE — Patient Instructions (Addendum)
Medication Instructions:  Your physician has recommended you make the following change in your medication:  1. INCREASE COREG TO 12.5 MG TWICE DAILY.   *If you need a refill on your cardiac medications before your next appointment, please call your pharmacy*   Lab Work: NONE If you have labs (blood work) drawn today and your tests are completely normal, you will receive your results only by: Marland Kitchen MyChart Message (if you have MyChart) OR . A paper copy in the mail If you have any lab test that is abnormal or we need to change your treatment, we will call you to review the results.   Testing/Procedures: NONE   Follow-Up: At [facility], you and your health needs are our priority.  As part of our continuing mission to provide you with exceptional heart care, we have created designated Provider Care Teams.  These Care Teams include your primary Cardiologist (physician) and Advanced Practice Providers (APPs -  Physician Assistants and Nurse Practitioners) who all work together to provide you with the care you need, when you need it.  We recommend signing up for the patient portal called "MyChart".  Sign up information is provided on this After Visit Summary.  MyChart is used to connect with patients for Virtual Visits (Telemedicine).  Patients are able to view lab/test results, encounter notes, upcoming appointments, etc.  Non-urgent messages can be sent to your provider as well.   To learn more about what you can do with MyChart, go to [email or web address].    Your next appointment:   3 month(s)  The format for your next appointment:   In Person  Provider:   Berniece Salines, DO   You have been referred to ENT. THEY WILL CALL YOU TO SCHEDULE AN APPOINTMENT.  You have been referred to SEE AN ENDOCRINOLOGIST. THEY WILL CALL YOU TO SCHEDULE AN APPOINTMENT.      Other Instructions

## 2020-01-07 NOTE — Telephone Encounter (Signed)
New Message  Patient is calling in to follow up about visit. States that on 11/14/19 Dr Harriet Masson stopped amlodipine, but in the after visit summary from today's visit it states that was the day the medication was started. Please assist with making those corrections.

## 2020-01-07 NOTE — Progress Notes (Signed)
Cardiology Office Note:    Date:  01/07/2020   ID:  [name], DOB September 29, 1945, MRN [id number]  PCP:  Serita Grammes, MD  Cardiologist:  Berniece Salines, DO  Electrophysiologist:  None   Referring MD: Serita Grammes, MD   Chief Complaint  Patient presents with  . Follow-up   History of Present Illness:    Pamela [name] is a 74 y.o. 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   I did see the patient on November 14, 2019 at that time she reported significant symptomatic orthostatic hypotension.  For this I start the patient amlodipine to see if her symptoms will improve as this could have been contributing.  In addition I educated the patient on use of her stockings.   She is here today for follow-up visit.  She tells me that her symptoms had improved some since I last seen her and says she has been off the amlodipine.  She does have intermittent periods where when she wakes up quickly she gets dizzy which she states mostly at night.  She notes that this has been associated with palpitations as well.  Past Medical History:  Diagnosis Date  . Atrial fibrillation [I48.91] 10/01/2014  . Chronic anticoagulation   . Diabetes mellitus   . Dizziness 05/23/2011  . DJD (degenerative joint disease)   . DOE (dyspnea on exertion) 03/03/2013   Onset early 2020  - 02/05/2019   Walked RA  2 laps @  approx 237ft each @ nl pace  stopped due to  Light headed, no sp or sob, no desats    . DVT (deep venous thrombosis) ([place]) 07/30/2013  . GERD (gastroesophageal reflux disease)   . HTN (hypertension)   . Hypercholesteremia   . Hypertension 05/23/2011  . Hyponatremia 02/06/2019   Na 126 on no obvious meds to cause it with last na 133 in 2014   . Long term (current) use of anticoagulants 03/03/2013   . Low back pain 07/06/2018  . Low back strain 08/09/2018  . Obesity   . Osteoarthritis of right knee 07/06/2018  . Pain in right knee 07/06/2018  . Protein S deficiency ([place])    prior DVT L knee, L arm DVT,  followed by Dr West Pugh at [facility]  . PVC's (premature ventricular contractions)   . S/P cardiac catheterization 02/2013   Normal coronaries; low normal EF at 50%  . Solitary pulmonary nodule on lung CT 02/06/2019   First noted 01/13/2014 > no change as of 12/21/2018  . Spondylolisthesis, lumbar region 08/09/2018  . TIA (transient ischemic attack)     Past Surgical History:  Procedure Laterality Date  . CARPAL TUNNEL RELEASE  1994   bilateral  . CHOLECYSTECTOMY  1993  . left knee replacement  2006  . thumb surgery Left 2018  . TONSILLECTOMY    . TOTAL ABDOMINAL HYSTERECTOMY  1988    Current Medications: Current Meds  Medication Sig  . albuterol (VENTOLIN HFA) 108 (90 Base) MCG/ACT inhaler Inhale 1 puff into the lungs as needed for shortness of breath.  Marland Kitchen amLODipine (NORVASC) 2.5 MG tablet Take 2.5 mg by mouth daily.  Marland Kitchen aspirin EC 81 MG tablet Take 81 mg by mouth daily.  Marland Kitchen atorvastatin (LIPITOR) 40 MG tablet Take 40 mg by mouth daily.  Marland Kitchen azelastine (ASTELIN) 137 MCG/SPRAY nasal spray Place 2  sprays into the nose daily as needed for rhinitis or allergies.   . BELSOMRA 10 MG TABS Take 10 mg by mouth daily.  . Cholecalciferol (VITAMIN D3) 50 MCG (2000 UT) TABS Take by mouth.  . Dulaglutide (TRULICITY) 1.5 0000000 SOPN Inject into the skin once a week.  . ferrous sulfate 324 MG TBEC Take 324 mg by mouth.  . furosemide (LASIX) 20 MG tablet Take 20 mg by mouth daily.  . Insulin Glargine, 1 Unit Dial, (TOUJEO SOLOSTAR) 300 UNIT/ML SOPN Inject 10 Units into the skin daily.  Marland Kitchen ipratropium-albuterol (DUONEB) 0.5-2.5 (3) MG/3ML SOLN Take 3 mLs by nebulization 4 (four) times daily as needed. wheezing  . lisinopril (ZESTRIL) 2.5 MG tablet Take 1 tablet by mouth daily.  . magnesium oxide  (MAG-OX) 400 (241.3 MG) MG tablet Take 400 mg by mouth 2 (two) times daily.  . ondansetron (ZOFRAN-ODT) 8 MG disintegrating tablet Take 1 tablet by mouth as needed for nausea/vomiting.  . pantoprazole (PROTONIX) 40 MG tablet Take 1 tablet (40 mg total) by mouth daily. Take 30-60 min before first meal of the day  . pioglitazone (ACTOS) 30 MG tablet Take 1 tablet by mouth daily.  Marland Kitchen tolterodine (DETROL LA) 2 MG 24 hr capsule 2 mg daily.  . vitamin B-12 (CYANOCOBALAMIN) 500 MCG tablet Take 500 mcg by mouth daily.  Marland Kitchen warfarin (COUMADIN) 1 MG tablet Take 1 mg by mouth as directed.  . warfarin (COUMADIN) 5 MG tablet Take 5 mg by mouth daily.  . [DISCONTINUED] carvedilol (COREG) 6.25 MG tablet Take 6.25 mg by mouth 2 (two) times daily with a meal.     Allergies:   Ketek [telithromycin], Loxapine succinate, Naproxen sodium, Amoxapine and related, Darvon, Amoxicillin, and Propoxyphene   Social History   Socioeconomic History  . Marital status: Married    Spouse name: [name]  . Number of children: Not on file  . Years of education: Not on file  . Highest education level: Not on file  Occupational History  . Not on file  Tobacco Use  . Smoking status: Never Smoker  . Smokeless tobacco: Never Used  Substance and Sexual Activity  . Alcohol use: No  . Drug use: No  . Sexual activity: Not on file  Other Topics Concern  . Not on file  Social History Narrative   Lives with spouse in [place].  2 grown daughters.   Retired Glass blower/designer     Social Determinants of Radio broadcast assistant Strain:   . Difficulty of Paying Living Expenses:   Food Insecurity:   . Worried About Charity fundraiser in the Last Year:   . Arboriculturist in the Last Year:   Transportation Needs:   . Film/video editor (Medical):   Marland Kitchen Lack of Transportation (Non-Medical):   Physical Activity:   . Days of Exercise per Week:   . Minutes of Exercise per Session:   Stress:   . Feeling of Stress :   Social  Connections:   . Frequency of Communication with Friends and Family:   . Frequency of Social Gatherings with Friends and Family:   . Attends Religious Services:   . Active Member of Clubs or Organizations:   . Attends Archivist Meetings:   Marland Kitchen Marital Status:      Family History: The patient's family history includes Antithrombin III deficiency in her mother; Cirrhosis in her mother; Coronary artery disease in her father; Heart attack in her brother, father, and  sister; Hypertension in her brother, father, and sister; Protein S deficiency in her daughter and sister; Ulcerative colitis in her mother. There is no history of Stroke, Colitis, Colon polyps, Esophageal cancer, Liver cancer, Pancreatic cancer, Rectal cancer, or Stomach cancer.  ROS:   Review of Systems  Constitution: Negative for decreased appetite, fever and weight gain.  HENT: Negative for congestion, ear discharge, hoarse voice and sore throat.   Eyes: Negative for discharge, redness, vision loss in right eye and visual halos.  Cardiovascular: Negative for chest pain, dyspnea on exertion, leg swelling, orthopnea and palpitations.  Respiratory: Negative for cough, hemoptysis, shortness of breath and snoring.   Endocrine: Negative for heat intolerance and polyphagia.  Hematologic/Lymphatic: Negative for bleeding problem. Does not bruise/bleed easily.  Skin: Negative for flushing, nail changes, rash and suspicious lesions.  Musculoskeletal: Negative for arthritis, joint pain, muscle cramps, myalgias, neck pain and stiffness.  Gastrointestinal: Negative for abdominal pain, bowel incontinence, diarrhea and excessive appetite.  Genitourinary: Negative for decreased libido, genital sores and incomplete emptying.  Neurological: Negative for brief paralysis, focal weakness, headaches and loss of balance.  Psychiatric/Behavioral: Negative for altered mental status, depression and suicidal ideas.  Allergic/Immunologic: Negative  for HIV exposure and persistent infections.    EKGs/Labs/Other Studies Reviewed:    The following studies were reviewed today:   EKG: None today  TTE IMPRESSIONS December 31, 2019 1. Left ventricular ejection fraction, by estimation, is 60 to 65%. The  left ventricle has normal function. The left ventricle has no regional  wall motion abnormalities. There is moderate concentric left ventricular  hypertrophy. Left ventricular  diastolic parameters are consistent with Grade I diastolic dysfunction  (impaired relaxation).  2. Right ventricular systolic function is normal. The right ventricular  size is normal. There is normal pulmonary artery systolic pressure.  3. Left atrial size was mildly dilated.  4. The mitral valve is normal in structure. Trivial mitral valve  regurgitation. No evidence of mitral stenosis.  5. The aortic valve is tricuspid. Aortic valve regurgitation is not  visualized. No aortic stenosis is present.  6. The inferior vena cava is normal in size with greater than 50%  respiratory variability, suggesting right atrial pressure of 3 mmHg.   The patient wore the monitor for 6 days 20 hours starting 11/14/2019. Indication: Palpitations The minimum heart rate was 58 bpm, maximum heart rate was 203 bpm, and average heart rate was 69 bpm. Predominant underlying rhythm was Sinus Rhythm.  5 Supraventricular Tachycardia runs occurred, the run with the fastest interval lasting 6 beats with a maximum rate of 203 bpm, the longest lasting 10.6 secs with an average rate of 148 bpm.   Premature atrial complexes were rare less than 1.0%. Premature Ventricular complexes were occasional (1.8%, 12531).  Ventricular Bigeminy and Trigeminy were present. No ventricular tachycardia, no pauses, No AV block and no atrial fibrillation present. No patient triggered events or diary event noted.  Conclusion: This study is remarkable for the following:                              1.  Asymptomatic paroxysmal supraventricular tachycardia which is likely atrial tachycardia with variable block.                             2.  Asymptomatic occasional premature ventricular complex.  Recent Labs: 02/05/2019: BUN 18; Creatinine, Ser 0.90; Hemoglobin 12.2; Platelets  274.0; Potassium 4.9; Pro B Natriuretic peptide (BNP) 89.0; Sodium 126 11/14/2019: TSH 2.020  Recent Lipid Panel No results found for: CHOL, TRIG, HDL, CHOLHDL, VLDL, LDLCALC, LDLDIRECT  Physical Exam:    VS:  BP (!) 170/80 (BP Location: Right Arm, Patient Position: Sitting, Cuff Size: Normal)   Pulse 69   Ht 5\' 4"  (1.626 m)   Wt 175 lb (79.4 kg)   SpO2 99%   BMI 30.04 kg/m     Wt Readings from Last 3 Encounters:  01/07/20 175 lb (79.4 kg)  11/14/19 173 lb (78.5 kg)  10/03/19 173 lb (78.5 kg)     GEN: Well nourished, well developed in no acute distress HEENT: Normal NECK: No JVD; No carotid bruits LYMPHATICS: No lymphadenopathy CARDIAC: S1S2 noted,RRR, no murmurs, rubs, gallops RESPIRATORY:  Clear to auscultation without rales, wheezing or rhonchi  ABDOMEN: Soft, non-tender, non-distended, +bowel sounds, no guarding. EXTREMITIES: No edema, No cyanosis, no clubbing MUSCULOSKELETAL:  No deformity  SKIN: Warm and dry NEUROLOGIC:  Alert and oriented x 3, non-focal PSYCHIATRIC:  Normal affect, good insight  ASSESSMENT:    1. Orthostatic hypotension   2. Dizziness   3. Autonomic dysfunction   4. PVC's (premature ventricular contractions)   5. Essential hypertension   6. Paroxysmal atrial fibrillation (HCC)   7. Protein S deficiency ([place])    PLAN:    Her monitor did show some evidence of atrial tachycardia with a heart rate going up in the 200s with her palpitation correlating with her dizziness in 1 to slow down her heart rate by increasing her carvedilol to 12.5 mg twice a day.  Her blood pressure reports shows that she does some orthostasis but her symptoms has improved except for when she is  having this palpitations with the dizziness.  I do think that she may be experiencing some autonomic dysfunction in the setting of long-term diabetes as well.  For now it would be beneficial for her to see an endocrinologist as well as ENT.   The patient is in agreement with the above plan. The patient left the office in stable condition.  The patient will follow up in 3 months or sooner if needed  Medication Adjustments/Labs and Tests Ordered: Current medicines are reviewed at length with the patient today.  Concerns regarding medicines are outlined above.  Orders Placed This Encounter  Procedures  . Ambulatory referral to Endocrinology  . Ambulatory referral to ENT   Meds ordered this encounter  Medications  . carvedilol (COREG) 12.5 MG tablet    Sig: Take 1 tablet (12.5 mg total) by mouth 2 (two) times daily.    Dispense:  180 tablet    Refill:  3    Patient Instructions  Medication Instructions:  Your physician has recommended you make the following change in your medication:  1. INCREASE COREG TO 12.5 MG TWICE DAILY.   *If you need a refill on your cardiac medications before your next appointment, please call your pharmacy*   Lab Work: NONE If you have labs (blood work) drawn today and your tests are completely normal, you will receive your results only by: Marland Kitchen MyChart Message (if you have MyChart) OR . A paper copy in the mail If you have any lab test that is abnormal or we need to change your treatment, we will call you to review the results.   Testing/Procedures: NONE   Follow-Up: At [facility], you and your health needs are our priority.  As part of our continuing mission  to provide you with exceptional heart care, we have created designated Provider Care Teams.  These Care Teams include your primary Cardiologist (physician) and Advanced Practice Providers (APPs -  Physician Assistants and Nurse Practitioners) who all work together to provide you with the care  you need, when you need it.  We recommend signing up for the patient portal called "MyChart".  Sign up information is provided on this After Visit Summary.  MyChart is used to connect with patients for Virtual Visits (Telemedicine).  Patients are able to view lab/test results, encounter notes, upcoming appointments, etc.  Non-urgent messages can be sent to your provider as well.   To learn more about what you can do with MyChart, go to [email or web address].    Your next appointment:   3 month(s)  The format for your next appointment:   In Person  Provider:   Berniece Salines, DO   You have been referred to ENT. THEY WILL CALL YOU TO SCHEDULE AN APPOINTMENT.  You have been referred to SEE AN ENDOCRINOLOGIST. THEY WILL CALL YOU TO SCHEDULE AN APPOINTMENT.      Other Instructions      Adopting a Healthy Lifestyle.  Know what a healthy weight is for you (roughly BMI <25) and aim to maintain this   Aim for 7+ servings of fruits and vegetables daily   65-80+ fluid ounces of water or unsweet tea for healthy kidneys   Limit to max 1 drink of alcohol per day; avoid smoking/tobacco   Limit animal fats in diet for cholesterol and heart health - choose grass fed whenever available   Avoid highly processed foods, and foods high in saturated/trans fats   Aim for low stress - take time to unwind and care for your mental health   Aim for 150 min of moderate intensity exercise weekly for heart health, and weights twice weekly for bone health   Aim for 7-9 hours of sleep daily   When it comes to diets, agreement about the perfect plan isnt easy to find, even among the experts. Experts at the [employer] developed an idea known as the Healthy Eating Plate. Just imagine a plate divided into logical, healthy portions.   The emphasis is on diet quality:   Load up on vegetables and fruits - one-half of your plate: Aim for color and variety, and remember that potatoes  dont count.   Go for whole grains - one-quarter of your plate: Whole wheat, barley, wheat berries, quinoa, oats, brown rice, and foods made with them. If you want pasta, go with whole wheat pasta.   Protein power - one-quarter of your plate: Fish, chicken, beans, and nuts are all healthy, versatile protein sources. Limit red meat.   The diet, however, does go beyond the plate, offering a few other suggestions.   Use healthy plant oils, such as olive, canola, soy, corn, sunflower and peanut. Check the labels, and avoid partially hydrogenated oil, which have unhealthy trans fats.   If youre thirsty, drink water. Coffee and tea are good in moderation, but skip sugary drinks and limit milk and dairy products to one or two daily servings.   The type of carbohydrate in the diet is more important than the amount. Some sources of carbohydrates, such as vegetables, fruits, whole grains, and beans-are healthier than others.   Finally, stay active  Signed, Berniece Salines, DO  01/07/2020 3:50 PM    [facility]

## 2020-01-08 NOTE — Addendum Note (Signed)
Addended by: Resa Miner I on: 01/08/2020 08:13 AM   Modules accepted: Orders

## 2020-01-08 NOTE — Telephone Encounter (Signed)
Spoke to the patient just now and let her know that she should not be taking her amlodipine. She verbalizes understanding and no other issues or concerns were noted.   Encouraged patient to call back with any questions or concerns.

## 2020-01-08 NOTE — Telephone Encounter (Signed)
I stopped her Amlodipine at her las visit. She should not be on this medication. I did not restart it yesterday.

## 2020-01-13 DIAGNOSIS — E1165 Type 2 diabetes mellitus with hyperglycemia: Secondary | ICD-10-CM | POA: Diagnosis not present

## 2020-01-13 DIAGNOSIS — M2550 Pain in unspecified joint: Secondary | ICD-10-CM | POA: Diagnosis not present

## 2020-01-13 DIAGNOSIS — Z6830 Body mass index (BMI) 30.0-30.9, adult: Secondary | ICD-10-CM | POA: Diagnosis not present

## 2020-01-13 DIAGNOSIS — I4891 Unspecified atrial fibrillation: Secondary | ICD-10-CM | POA: Diagnosis not present

## 2020-01-13 DIAGNOSIS — E669 Obesity, unspecified: Secondary | ICD-10-CM | POA: Diagnosis not present

## 2020-01-13 DIAGNOSIS — D649 Anemia, unspecified: Secondary | ICD-10-CM | POA: Diagnosis not present

## 2020-01-13 DIAGNOSIS — R0600 Dyspnea, unspecified: Secondary | ICD-10-CM | POA: Diagnosis not present

## 2020-01-13 DIAGNOSIS — I10 Essential (primary) hypertension: Secondary | ICD-10-CM | POA: Diagnosis not present

## 2020-01-27 DIAGNOSIS — D6859 Other primary thrombophilia: Secondary | ICD-10-CM | POA: Diagnosis not present

## 2020-01-27 DIAGNOSIS — Z7901 Long term (current) use of anticoagulants: Secondary | ICD-10-CM | POA: Diagnosis not present

## 2020-01-29 DIAGNOSIS — D649 Anemia, unspecified: Secondary | ICD-10-CM | POA: Diagnosis not present

## 2020-01-29 DIAGNOSIS — M810 Age-related osteoporosis without current pathological fracture: Secondary | ICD-10-CM | POA: Diagnosis not present

## 2020-01-29 DIAGNOSIS — D6859 Other primary thrombophilia: Secondary | ICD-10-CM | POA: Diagnosis not present

## 2020-01-29 DIAGNOSIS — Z7901 Long term (current) use of anticoagulants: Secondary | ICD-10-CM | POA: Diagnosis not present

## 2020-02-03 DIAGNOSIS — I10 Essential (primary) hypertension: Secondary | ICD-10-CM | POA: Diagnosis not present

## 2020-02-03 DIAGNOSIS — E785 Hyperlipidemia, unspecified: Secondary | ICD-10-CM | POA: Diagnosis not present

## 2020-02-11 DIAGNOSIS — R58 Hemorrhage, not elsewhere classified: Secondary | ICD-10-CM | POA: Diagnosis not present

## 2020-02-11 DIAGNOSIS — Z7901 Long term (current) use of anticoagulants: Secondary | ICD-10-CM | POA: Diagnosis not present

## 2020-02-11 DIAGNOSIS — Z6830 Body mass index (BMI) 30.0-30.9, adult: Secondary | ICD-10-CM | POA: Diagnosis not present

## 2020-02-11 DIAGNOSIS — M2550 Pain in unspecified joint: Secondary | ICD-10-CM | POA: Diagnosis not present

## 2020-02-11 DIAGNOSIS — M069 Rheumatoid arthritis, unspecified: Secondary | ICD-10-CM | POA: Diagnosis not present

## 2020-02-13 DIAGNOSIS — D649 Anemia, unspecified: Secondary | ICD-10-CM | POA: Diagnosis not present

## 2020-02-13 DIAGNOSIS — R55 Syncope and collapse: Secondary | ICD-10-CM | POA: Diagnosis not present

## 2020-02-13 DIAGNOSIS — S82445A Nondisplaced spiral fracture of shaft of left fibula, initial encounter for closed fracture: Secondary | ICD-10-CM | POA: Diagnosis not present

## 2020-02-13 DIAGNOSIS — S82852A Displaced trimalleolar fracture of left lower leg, initial encounter for closed fracture: Secondary | ICD-10-CM | POA: Diagnosis not present

## 2020-02-13 DIAGNOSIS — J9811 Atelectasis: Secondary | ICD-10-CM | POA: Diagnosis not present

## 2020-02-13 HISTORY — PX: FRACTURE SURGERY: SHX138

## 2020-02-16 DIAGNOSIS — M542 Cervicalgia: Secondary | ICD-10-CM | POA: Diagnosis not present

## 2020-02-16 DIAGNOSIS — R52 Pain, unspecified: Secondary | ICD-10-CM | POA: Diagnosis not present

## 2020-02-16 DIAGNOSIS — M25552 Pain in left hip: Secondary | ICD-10-CM | POA: Diagnosis not present

## 2020-02-16 DIAGNOSIS — S82852A Displaced trimalleolar fracture of left lower leg, initial encounter for closed fracture: Secondary | ICD-10-CM | POA: Diagnosis not present

## 2020-02-16 DIAGNOSIS — R112 Nausea with vomiting, unspecified: Secondary | ICD-10-CM | POA: Diagnosis not present

## 2020-02-16 DIAGNOSIS — M25562 Pain in left knee: Secondary | ICD-10-CM | POA: Diagnosis not present

## 2020-02-16 DIAGNOSIS — R110 Nausea: Secondary | ICD-10-CM | POA: Diagnosis not present

## 2020-02-16 DIAGNOSIS — I10 Essential (primary) hypertension: Secondary | ICD-10-CM | POA: Diagnosis not present

## 2020-02-17 ENCOUNTER — Telehealth: Payer: Self-pay | Admitting: Cardiology

## 2020-02-17 DIAGNOSIS — E1165 Type 2 diabetes mellitus with hyperglycemia: Secondary | ICD-10-CM | POA: Diagnosis not present

## 2020-02-17 DIAGNOSIS — R442 Other hallucinations: Secondary | ICD-10-CM | POA: Diagnosis not present

## 2020-02-17 DIAGNOSIS — I491 Atrial premature depolarization: Secondary | ICD-10-CM | POA: Diagnosis not present

## 2020-02-17 DIAGNOSIS — J180 Bronchopneumonia, unspecified organism: Secondary | ICD-10-CM | POA: Diagnosis not present

## 2020-02-17 DIAGNOSIS — R0902 Hypoxemia: Secondary | ICD-10-CM | POA: Diagnosis not present

## 2020-02-17 DIAGNOSIS — R404 Transient alteration of awareness: Secondary | ICD-10-CM | POA: Diagnosis not present

## 2020-02-17 DIAGNOSIS — R410 Disorientation, unspecified: Secondary | ICD-10-CM | POA: Diagnosis not present

## 2020-02-17 NOTE — Telephone Encounter (Signed)
Spoke with Dr. Harriet Masson just now and she said she would like to see the patient in the first week in July. I called and spoke with the patient and the patients daughter. We got her scheduled to see Dr. Harriet Masson on 03/05/20. She verbalizes understanding and thanks me for the call back.    Encouraged patient to call back with any questions or concerns.
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
## 2020-02-18 DIAGNOSIS — R410 Disorientation, unspecified: Secondary | ICD-10-CM | POA: Diagnosis not present

## 2020-02-18 DIAGNOSIS — J9601 Acute respiratory failure with hypoxia: Secondary | ICD-10-CM | POA: Diagnosis not present

## 2020-02-18 DIAGNOSIS — R109 Unspecified abdominal pain: Secondary | ICD-10-CM | POA: Diagnosis not present

## 2020-02-18 DIAGNOSIS — J180 Bronchopneumonia, unspecified organism: Secondary | ICD-10-CM | POA: Diagnosis not present

## 2020-02-18 DIAGNOSIS — D6859 Other primary thrombophilia: Secondary | ICD-10-CM | POA: Diagnosis not present

## 2020-02-18 DIAGNOSIS — R519 Headache, unspecified: Secondary | ICD-10-CM | POA: Diagnosis not present

## 2020-02-18 DIAGNOSIS — R0902 Hypoxemia: Secondary | ICD-10-CM | POA: Diagnosis not present

## 2020-02-18 DIAGNOSIS — J90 Pleural effusion, not elsewhere classified: Secondary | ICD-10-CM | POA: Diagnosis not present

## 2020-02-18 DIAGNOSIS — R4182 Altered mental status, unspecified: Secondary | ICD-10-CM | POA: Diagnosis not present

## 2020-02-18 DIAGNOSIS — J189 Pneumonia, unspecified organism: Secondary | ICD-10-CM | POA: Diagnosis not present

## 2020-02-18 DIAGNOSIS — E119 Type 2 diabetes mellitus without complications: Secondary | ICD-10-CM | POA: Diagnosis not present

## 2020-02-18 DIAGNOSIS — J9691 Respiratory failure, unspecified with hypoxia: Secondary | ICD-10-CM | POA: Diagnosis not present

## 2020-02-18 DIAGNOSIS — N179 Acute kidney failure, unspecified: Secondary | ICD-10-CM | POA: Diagnosis not present

## 2020-02-18 DIAGNOSIS — I110 Hypertensive heart disease with heart failure: Secondary | ICD-10-CM | POA: Diagnosis not present

## 2020-02-18 DIAGNOSIS — J9811 Atelectasis: Secondary | ICD-10-CM | POA: Diagnosis not present

## 2020-02-18 DIAGNOSIS — I10 Essential (primary) hypertension: Secondary | ICD-10-CM | POA: Diagnosis not present

## 2020-02-18 DIAGNOSIS — E785 Hyperlipidemia, unspecified: Secondary | ICD-10-CM | POA: Diagnosis not present

## 2020-02-18 DIAGNOSIS — E871 Hypo-osmolality and hyponatremia: Secondary | ICD-10-CM | POA: Diagnosis not present

## 2020-02-18 DIAGNOSIS — S82852A Displaced trimalleolar fracture of left lower leg, initial encounter for closed fracture: Secondary | ICD-10-CM | POA: Diagnosis not present

## 2020-02-18 DIAGNOSIS — G9341 Metabolic encephalopathy: Secondary | ICD-10-CM | POA: Diagnosis not present

## 2020-02-19 DIAGNOSIS — J189 Pneumonia, unspecified organism: Secondary | ICD-10-CM | POA: Diagnosis not present

## 2020-02-19 DIAGNOSIS — E119 Type 2 diabetes mellitus without complications: Secondary | ICD-10-CM | POA: Diagnosis not present

## 2020-02-19 DIAGNOSIS — J9691 Respiratory failure, unspecified with hypoxia: Secondary | ICD-10-CM | POA: Diagnosis not present

## 2020-02-19 DIAGNOSIS — I10 Essential (primary) hypertension: Secondary | ICD-10-CM | POA: Diagnosis not present

## 2020-02-20 DIAGNOSIS — E119 Type 2 diabetes mellitus without complications: Secondary | ICD-10-CM | POA: Diagnosis not present

## 2020-02-20 DIAGNOSIS — I10 Essential (primary) hypertension: Secondary | ICD-10-CM | POA: Diagnosis not present

## 2020-02-20 DIAGNOSIS — J9691 Respiratory failure, unspecified with hypoxia: Secondary | ICD-10-CM | POA: Diagnosis not present

## 2020-02-20 DIAGNOSIS — J189 Pneumonia, unspecified organism: Secondary | ICD-10-CM | POA: Diagnosis not present

## 2020-02-25 ENCOUNTER — Encounter (HOSPITAL_COMMUNITY): Payer: Self-pay | Admitting: Orthopedic Surgery

## 2020-02-25 ENCOUNTER — Other Ambulatory Visit: Payer: Self-pay

## 2020-02-25 ENCOUNTER — Other Ambulatory Visit (HOSPITAL_COMMUNITY)
Admission: RE | Admit: 2020-02-25 | Discharge: 2020-02-25 | Disposition: A | Discharge status: Home / Self Care | Payer: Medicare PPO | Source: Ambulatory Visit | Attending: Orthopedic Surgery | Admitting: Orthopedic Surgery

## 2020-02-25 DIAGNOSIS — Z01812 Encounter for preprocedural laboratory examination: Secondary | ICD-10-CM | POA: Diagnosis not present

## 2020-02-25 DIAGNOSIS — Z20822 Contact with and (suspected) exposure to covid-19: Secondary | ICD-10-CM | POA: Diagnosis not present

## 2020-02-25 LAB — SARS CORONAVIRUS 2 (TAT 6-24 HRS): SARS Coronavirus 2: NEGATIVE

## 2020-02-25 NOTE — Progress Notes (Signed)
Spoke with pt for pre-op call. Pt has hx of A-fib and is on Warfarin and Aspirin. She states she was instructed to hold the Warfarin, but to continue the Aspirin. Last dose of Warfarin was 02/21/20. Pt's cardiologist is Dr. Harriet Masson. She saw Dr. Rayann Heman in January due to syncopal episodes and he took her off of Furosemide. In March, she saw Dr. Harriet Masson and Furosemide was not on her med list, but in May it was back on her med list. When I asked the patient about her taking Furosemide, she states it was Famotidine that was stopped.   Pt is a type 2 Diabetic. Last A1C was 6.4 on 10/30/19. She states her fasting blood surgar is usually in the 130's. Instructed pt to take 1/2 of her regular dose of Toujeo Insulin in the AM, she will take 8 units. Instructed pt to check her blood sugar in the AM when she gets up and every 2 hours until she gets to the hospital. If blood sugar is 70 or below, treat with 1/2 cup of clear juice (apple or cranberry) and recheck blood sugar 15 minutes after drinking juice. If blood sugar continues to be 70 or below, call the Short Stay department and ask to speak to a nurse. Pt voiced understanding.  Covid test will need to be done on arrival. Pt states she is completely vaccinated.  ERAS Protocol ordered. Instructed pt not to eat food after midnight, but may have clear liquids (diet or low sugar for Diabetes) until 12:30 PM. List of clear liquids reviewed with pt. Pt will drink a 10 ounce bottle of G2 Gatorade just prior to 12:30 PM, that will be the last liquid that she will have prior to surgery.

## 2020-02-25 NOTE — Anesthesia Preprocedure Evaluation (Addendum)
Anesthesia Evaluation  Patient identified by MRN, date of birth, ID band Patient awake    Reviewed: Allergy & Precautions, H&P , NPO status , Patient's Chart, lab work & pertinent test results  History of Anesthesia Complications (+) PONV and history of anesthetic complications  Airway Mallampati: II   Neck ROM: full    Dental   Pulmonary shortness of breath, asthma , sleep apnea ,    breath sounds clear to auscultation       Cardiovascular hypertension, + DOE and + DVT  + dysrhythmias Atrial Fibrillation  Rhythm:regular Rate:Normal     Neuro/Psych TIA Neuromuscular disease    GI/Hepatic GERD  ,  Endo/Other  diabetes, Type 2  Renal/GU      Musculoskeletal  (+) Arthritis ,   Abdominal   Peds  Hematology Protein S deficiency   Anesthesia Other Findings   Reproductive/Obstetrics                            Anesthesia Physical Anesthesia Plan  ASA: III  Anesthesia Plan: General   Post-op Pain Management:  Regional for Post-op pain   Induction: Intravenous  PONV Risk Score and Plan: 4 or greater and Ondansetron, Dexamethasone and Treatment may vary due to age or medical condition  Airway Management Planned: LMA  Additional Equipment:   Intra-op Plan:   Post-operative Plan: Extubation in OR  Informed Consent: I have reviewed the patients History and Physical, chart, labs and discussed the procedure including the risks, benefits and alternatives for the proposed anesthesia with the patient or authorized representative who has indicated his/her understanding and acceptance.       Plan Discussed with: CRNA, Anesthesiologist and Surgeon  Anesthesia Plan Comments: (See APP note by Durel Salts, FNP)       Anesthesia Quick Evaluation

## 2020-02-25 NOTE — Progress Notes (Signed)
Anesthesia Chart Review:  Pt is a same day work up   Case: [id number] Date/Time: 02/26/20 1309   Procedure: OPEN REDUCTION INTERNAL FIXATION (ORIF) ANKLE FRACTURE (Left Ankle) - 90 mins   Anesthesia type: Choice   Pre-op diagnosis: Left ankle bimal fracture   Location: [facility] OR ROOM 03 / [place] [facility]   Surgeons: Nicholes Stairs, MD      DISCUSSION:  Pt is a 74 year old with hx PAF (Dr. Jackalyn Lombard note 10/03/19 questions AF dx), PSVT (asymptomatic - identified on cardiac event monitor 11/2019), PVCs, HTN, DM, DVT, protein S deficiency, chronic anticoagulation, stroke, TIA, OSA, asthma, anemia  Pt has had repeated episodes of orthostatic dizziness with occasional syncope. Cardiologist Dr. Harriet Masson is evaluating pt for this, suspects autonomic dysfunction in setting of long-term diabetes - referred pt to ENT and endocrinology, medications adjusted at last visit 01/07/20. Pt's broken ankle (related to current scheduled procedure) a result of syncope - Dr. Harriet Masson is aware and f/u to see Dr. Harriet Masson scheduled for 03/05/20.    PROVIDERS: - PCP is Serita Grammes, MD - Cardiologist is Berniece Salines, MD. Last office visit 01/07/20 - EP cardiologist is Thompson Grayer, MD. Last virtual visit 10/03/19, prn f/u recommended   LABS: Will be obtained day of surgery   EKG 11/15/19: NSR.  Nonspecific ST abnormality   CV:  Echo 12/31/19:  1. LVEF is 60 to 65%. The left ventricle has normal function. The left ventricle has no regional wall motion abnormalities. There is moderate concentric left ventricular hypertrophy. Left ventricular diastolic parameters are consistent with Grade I diastolic dysfunction (impaired relaxation).  2. Right ventricular systolic function is normal. The right ventricular size is normal. There is normal pulmonary artery systolic pressure.  3. Left atrial size was mildly dilated.  4. The mitral valve is normal in structure. Trivial mitral valve regurgitation. No evidence of mitral stenosis.  5. The  aortic valve is tricuspid. Aortic valve regurgitation is not visualized. No aortic stenosis is present.  6. The inferior vena cava is normal in size with greater than 50% respiratory variability, suggesting right atrial pressure of 3 mmHg.   Cardiac event monitor 11/14/19: 1.  Asymptomatic paroxysmal supraventricular tachycardia which is likely atrial tachycardia with variable block. 2.  Asymptomatic occasional premature ventricular complex.  Nuclear stress test 12/22/18:  - No reversible ischemia or infarction   Past Medical History:  Diagnosis Date  . Anemia   . Asthma   . Atrial fibrillation [I48.91] 10/01/2014  . Bilateral leg cramps   . Chronic anticoagulation   . Chronic constipation   . Complication of anesthesia   . Diabetes mellitus   . Dizziness 05/23/2011  . DJD (degenerative joint disease)   . DOE (dyspnea on exertion) 03/03/2013   Onset early 2020  - 02/05/2019   Walked RA  2 laps @  approx 245ft each @ nl pace  stopped due to  Light headed, no sp or sob, no desats    . DVT (deep venous thrombosis) ([place]) 07/30/2013  . Dyspnea    occasionally  . GERD (gastroesophageal reflux disease)   . Heart murmur    as a child  . HTN (hypertension)   . Hypercholesteremia   . Hypertension 05/23/2011  . Hyponatremia 02/06/2019   Na 126 on no obvious meds to cause it with last na 133 in 2014   . Long term (current) use of anticoagulants 03/03/2013  . Low back pain 07/06/2018  . Low back strain 08/09/2018  . Obesity   .  Osteoarthritis of right knee 07/06/2018  . Pain in right knee 07/06/2018  . Pneumonia   . PONV (postoperative nausea and vomiting)    needs pre-medication  . Protein S deficiency ([place])    prior DVT L knee, L arm DVT,  followed by Dr West Pugh at [facility]  . PVC's (premature ventricular contractions)   . S/P cardiac catheterization 02/2013   Normal coronaries; low normal EF at 50%  . Sleep apnea    can't use the cpap (diagnosed 2017)  . Solitary pulmonary nodule on lung CT  02/06/2019   First noted 01/13/2014 > no change as of 12/21/2018  . Spondylolisthesis, lumbar region 08/09/2018  . Stroke [facility])    TIA in 2018 or 2019  . TIA (transient ischemic attack)     Past Surgical History:  Procedure Laterality Date  . CARPAL TUNNEL RELEASE  1994   bilateral  . CHOLECYSTECTOMY  1993  . COLONOSCOPY    . FOOT TENDON SURGERY Right   . left knee replacement  2006  . thumb surgery Left 2018  . TONSILLECTOMY    . TOTAL ABDOMINAL HYSTERECTOMY  1988    MEDICATIONS: No current facility-administered medications for this encounter.   Marland Kitchen acetaminophen (TYLENOL) 500 MG tablet  . albuterol (VENTOLIN HFA) 108 (90 Base) MCG/ACT inhaler  . ascorbic acid (VITAMIN C) 500 MG tablet  . aspirin EC 81 MG tablet  . atorvastatin (LIPITOR) 40 MG tablet  . azelastine (ASTELIN) 137 MCG/SPRAY nasal spray  . B Complex-C (B-COMPLEX WITH VITAMIN C) tablet  . carvedilol (COREG) 12.5 MG tablet  . cefdinir (OMNICEF) 300 MG capsule  . Cholecalciferol (VITAMIN D3) 50 MCG (2000 UT) TABS  . denosumab (PROLIA) 60 MG/ML SOSY injection  . doxycycline (VIBRAMYCIN) 100 MG capsule  . Dulaglutide (TRULICITY) 1.5 IZ/1.2WP SOPN  . ferrous sulfate 325 (65 FE) MG tablet  . furosemide (LASIX) 20 MG tablet  . Insulin Glargine, 1 Unit Dial, (TOUJEO SOLOSTAR) 300 UNIT/ML SOPN  . ipratropium-albuterol (DUONEB) 0.5-2.5 (3) MG/3ML SOLN  . lisinopril (ZESTRIL) 2.5 MG tablet  . Magnesium Oxide 400 (240 Mg) MG TABS  . Menthol-Methyl Salicylate (SALONPAS PAIN RELIEF PATCH) PTCH  . ondansetron (ZOFRAN-ODT) 8 MG disintegrating tablet  . oxyCODONE (OXY IR/ROXICODONE) 5 MG immediate release tablet  . pantoprazole (PROTONIX) 40 MG tablet  . pioglitazone (ACTOS) 30 MG tablet  . polyethylene glycol (MIRALAX / GLYCOLAX) 17 g packet  . senna-docusate (SENOKOT S) 8.6-50 MG tablet  . sodium chloride (OCEAN) 0.65 % SOLN nasal spray  . tolterodine (DETROL LA) 2 MG 24 hr capsule  . warfarin (COUMADIN) 1 MG tablet  .  warfarin (COUMADIN) 5 MG tablet   If labs acceptable day of surgery, I anticipate pt can proceed with surgery as scheduled.  Willeen Cass, FNP-BC [facility]/Anesthesiology Phone: [phone] 02/25/2020 10:46 AM

## 2020-02-26 ENCOUNTER — Ambulatory Visit (HOSPITAL_COMMUNITY): Payer: Medicare PPO

## 2020-02-26 ENCOUNTER — Ambulatory Visit (HOSPITAL_COMMUNITY): Payer: Medicare PPO | Admitting: Emergency Medicine

## 2020-02-26 ENCOUNTER — Encounter (HOSPITAL_COMMUNITY): Payer: Self-pay | Admitting: Orthopedic Surgery

## 2020-02-26 ENCOUNTER — Other Ambulatory Visit: Payer: Self-pay

## 2020-02-26 ENCOUNTER — Observation Stay (HOSPITAL_COMMUNITY)
Admission: RE | Admit: 2020-02-26 | Discharge: 2020-02-28 | Disposition: A | Discharge status: Home / Self Care | Payer: Medicare PPO | Attending: Orthopedic Surgery | Admitting: Orthopedic Surgery

## 2020-02-26 ENCOUNTER — Encounter (HOSPITAL_COMMUNITY)
Admission: RE | Disposition: A | Discharge status: Home / Self Care | Payer: Self-pay | Source: Home / Self Care | Attending: Orthopedic Surgery

## 2020-02-26 DIAGNOSIS — Z96652 Presence of left artificial knee joint: Secondary | ICD-10-CM | POA: Insufficient documentation

## 2020-02-26 DIAGNOSIS — Z7901 Long term (current) use of anticoagulants: Secondary | ICD-10-CM | POA: Diagnosis not present

## 2020-02-26 DIAGNOSIS — Z419 Encounter for procedure for purposes other than remedying health state, unspecified: Secondary | ICD-10-CM

## 2020-02-26 DIAGNOSIS — Z86718 Personal history of other venous thrombosis and embolism: Secondary | ICD-10-CM | POA: Insufficient documentation

## 2020-02-26 DIAGNOSIS — I10 Essential (primary) hypertension: Secondary | ICD-10-CM | POA: Diagnosis not present

## 2020-02-26 DIAGNOSIS — J45909 Unspecified asthma, uncomplicated: Secondary | ICD-10-CM | POA: Diagnosis not present

## 2020-02-26 DIAGNOSIS — D6859 Other primary thrombophilia: Secondary | ICD-10-CM | POA: Insufficient documentation

## 2020-02-26 DIAGNOSIS — E7800 Pure hypercholesterolemia, unspecified: Secondary | ICD-10-CM | POA: Diagnosis not present

## 2020-02-26 DIAGNOSIS — G4730 Sleep apnea, unspecified: Secondary | ICD-10-CM | POA: Insufficient documentation

## 2020-02-26 DIAGNOSIS — Z794 Long term (current) use of insulin: Secondary | ICD-10-CM | POA: Insufficient documentation

## 2020-02-26 DIAGNOSIS — K219 Gastro-esophageal reflux disease without esophagitis: Secondary | ICD-10-CM | POA: Diagnosis not present

## 2020-02-26 DIAGNOSIS — S82302D Unspecified fracture of lower end of left tibia, subsequent encounter for closed fracture with routine healing: Secondary | ICD-10-CM | POA: Diagnosis not present

## 2020-02-26 DIAGNOSIS — S82842A Displaced bimalleolar fracture of left lower leg, initial encounter for closed fracture: Secondary | ICD-10-CM | POA: Diagnosis not present

## 2020-02-26 DIAGNOSIS — Z79899 Other long term (current) drug therapy: Secondary | ICD-10-CM | POA: Insufficient documentation

## 2020-02-26 DIAGNOSIS — E119 Type 2 diabetes mellitus without complications: Secondary | ICD-10-CM | POA: Diagnosis not present

## 2020-02-26 DIAGNOSIS — Z6831 Body mass index (BMI) 31.0-31.9, adult: Secondary | ICD-10-CM | POA: Diagnosis not present

## 2020-02-26 DIAGNOSIS — Z8673 Personal history of transient ischemic attack (TIA), and cerebral infarction without residual deficits: Secondary | ICD-10-CM | POA: Diagnosis not present

## 2020-02-26 DIAGNOSIS — I4891 Unspecified atrial fibrillation: Secondary | ICD-10-CM | POA: Insufficient documentation

## 2020-02-26 DIAGNOSIS — K5909 Other constipation: Secondary | ICD-10-CM | POA: Insufficient documentation

## 2020-02-26 DIAGNOSIS — W1830XA Fall on same level, unspecified, initial encounter: Secondary | ICD-10-CM | POA: Insufficient documentation

## 2020-02-26 DIAGNOSIS — Z7982 Long term (current) use of aspirin: Secondary | ICD-10-CM | POA: Insufficient documentation

## 2020-02-26 DIAGNOSIS — Y939 Activity, unspecified: Secondary | ICD-10-CM | POA: Diagnosis not present

## 2020-02-26 DIAGNOSIS — E669 Obesity, unspecified: Secondary | ICD-10-CM | POA: Insufficient documentation

## 2020-02-26 DIAGNOSIS — S82832D Other fracture of upper and lower end of left fibula, subsequent encounter for closed fracture with routine healing: Secondary | ICD-10-CM | POA: Diagnosis not present

## 2020-02-26 DIAGNOSIS — G8918 Other acute postprocedural pain: Secondary | ICD-10-CM | POA: Diagnosis not present

## 2020-02-26 DIAGNOSIS — R079 Chest pain, unspecified: Secondary | ICD-10-CM | POA: Diagnosis not present

## 2020-02-26 HISTORY — DX: Cardiac murmur, unspecified: R01.1

## 2020-02-26 HISTORY — DX: Cerebral infarction, unspecified: I63.9

## 2020-02-26 HISTORY — DX: Nausea with vomiting, unspecified: R11.2

## 2020-02-26 HISTORY — DX: Dyspnea, unspecified: R06.00

## 2020-02-26 HISTORY — PX: ORIF ANKLE FRACTURE: SHX5408

## 2020-02-26 HISTORY — DX: Anemia, unspecified: D64.9

## 2020-02-26 HISTORY — DX: Sleep apnea, unspecified: G47.30

## 2020-02-26 HISTORY — DX: Cramp and spasm: R25.2

## 2020-02-26 HISTORY — DX: Other constipation: K59.09

## 2020-02-26 HISTORY — DX: Other specified postprocedural states: Z98.890

## 2020-02-26 HISTORY — DX: Unspecified asthma, uncomplicated: J45.909

## 2020-02-26 HISTORY — DX: Pneumonia, unspecified organism: J18.9

## 2020-02-26 HISTORY — DX: Displaced bimalleolar fracture of left lower leg, initial encounter for closed fracture: S82.842A

## 2020-02-26 HISTORY — DX: Other complications of anesthesia, initial encounter: T88.59XA

## 2020-02-26 LAB — BASIC METABOLIC PANEL
Anion gap: 12 (ref 5–15)
BUN: 15 mg/dL (ref 8–23)
CO2: 26 mmol/L (ref 22–32)
Calcium: 8.8 mg/dL — ABNORMAL LOW (ref 8.9–10.3)
Chloride: 92 mmol/L — ABNORMAL LOW (ref 98–111)
Creatinine, Ser: 1.1 mg/dL — ABNORMAL HIGH (ref 0.44–1.00)
GFR calc Af Amer: 57 mL/min — ABNORMAL LOW (ref >60)
GFR calc non Af Amer: 49 mL/min — ABNORMAL LOW (ref >60)
Glucose, Bld: 157 mg/dL — ABNORMAL HIGH (ref 70–99)
Potassium: 4.3 mmol/L (ref 3.5–5.1)
Sodium: 130 mmol/L — ABNORMAL LOW (ref 135–145)

## 2020-02-26 LAB — PROTIME-INR
INR: 1.2 (ref 0.8–1.2)
Prothrombin Time: 14.5 seconds (ref 11.4–15.2)

## 2020-02-26 LAB — SURGICAL PCR SCREEN
MRSA, PCR: NEGATIVE
Staphylococcus aureus: NEGATIVE

## 2020-02-26 LAB — GLUCOSE, CAPILLARY
Glucose-Capillary: 155 mg/dL — ABNORMAL HIGH (ref 70–99)
Glucose-Capillary: 129 mg/dL — ABNORMAL HIGH (ref 70–99)
Glucose-Capillary: 199 mg/dL — ABNORMAL HIGH (ref 70–99)

## 2020-02-26 LAB — CBC
HCT: 34.9 % — ABNORMAL LOW (ref 36.0–46.0)
Hemoglobin: 11.3 g/dL — ABNORMAL LOW (ref 12.0–15.0)
MCH: 31 pg (ref 26.0–34.0)
MCHC: 32.4 g/dL (ref 30.0–36.0)
MCV: 95.9 fL (ref 80.0–100.0)
Platelets: 321 10*3/uL (ref 150–400)
RBC: 3.64 MIL/uL — ABNORMAL LOW (ref 3.87–5.11)
RDW: 13.2 % (ref 11.5–15.5)
WBC: 8.2 10*3/uL (ref 4.0–10.5)
nRBC: 0 % (ref 0.0–0.2)

## 2020-02-26 LAB — HEMOGLOBIN A1C
Hgb A1c MFr Bld: 7.4 % — ABNORMAL HIGH (ref 4.8–5.6)
Mean Plasma Glucose: 165.68 mg/dL

## 2020-02-26 SURGERY — OPEN REDUCTION INTERNAL FIXATION (ORIF) ANKLE FRACTURE
Anesthesia: General | Site: Ankle | Laterality: Left

## 2020-02-26 MED ORDER — FENTANYL CITRATE (PF) 250 MCG/5ML IJ SOLN
INTRAMUSCULAR | Status: AC
  Filled 2020-02-26: qty 5

## 2020-02-26 MED ORDER — CARVEDILOL 12.5 MG PO TABS
12.5000 mg | ORAL_TABLET | Freq: Two times a day (BID) | ORAL | Status: DC
  Administered 2020-02-26 – 2020-02-28 (×4): 12.5 mg via ORAL
  Filled 2020-02-26 (×4): qty 1

## 2020-02-26 MED ORDER — PIOGLITAZONE HCL 30 MG PO TABS
30.0000 mg | ORAL_TABLET | Freq: Every day | ORAL | Status: DC
  Administered 2020-02-27 – 2020-02-28 (×2): 30 mg via ORAL
  Filled 2020-02-26 (×3): qty 1

## 2020-02-26 MED ORDER — ONDANSETRON HCL 4 MG/2ML IJ SOLN: 4.0000 mg | Freq: Four times a day (QID) | INTRAMUSCULAR | Status: DC | PRN

## 2020-02-26 MED ORDER — ORAL CARE MOUTH RINSE: 15.0000 mL | Freq: Once | OROMUCOSAL | Status: AC

## 2020-02-26 MED ORDER — INSULIN ASPART 100 UNIT/ML ~~LOC~~ SOLN
0.0000 [IU] | Freq: Three times a day (TID) | SUBCUTANEOUS | Status: DC
  Administered 2020-02-27: 3 [IU] via SUBCUTANEOUS
  Administered 2020-02-27 (×2): 8 [IU] via SUBCUTANEOUS
  Administered 2020-02-28: 3 [IU] via SUBCUTANEOUS
  Administered 2020-02-28: 2 [IU] via SUBCUTANEOUS

## 2020-02-26 MED ORDER — SODIUM CHLORIDE 0.9 % IR SOLN
Status: DC | PRN
  Administered 2020-02-26: 1000 mL

## 2020-02-26 MED ORDER — ACETAMINOPHEN 325 MG PO TABS
325.0000 mg | ORAL_TABLET | Freq: Four times a day (QID) | ORAL | Status: DC | PRN
  Administered 2020-02-28: 650 mg via ORAL
  Filled 2020-02-26 (×2): qty 2

## 2020-02-26 MED ORDER — CHLORHEXIDINE GLUCONATE 0.12 % MT SOLN
15.0000 mL | Freq: Once | OROMUCOSAL | Status: AC
  Administered 2020-02-26: 15 mL via OROMUCOSAL
  Filled 2020-02-26: qty 15

## 2020-02-26 MED ORDER — CHLORHEXIDINE GLUCONATE 0.12 % MT SOLN
OROMUCOSAL | Status: AC
  Administered 2020-02-26: 15 mL via OROMUCOSAL
  Filled 2020-02-26: qty 15

## 2020-02-26 MED ORDER — ONDANSETRON HCL 4 MG/2ML IJ SOLN
INTRAMUSCULAR | Status: DC | PRN
  Administered 2020-02-26: 4 mg via INTRAVENOUS

## 2020-02-26 MED ORDER — DEXAMETHASONE SODIUM PHOSPHATE 10 MG/ML IJ SOLN
INTRAMUSCULAR | Status: DC | PRN
  Administered 2020-02-26: 4 mg via INTRAVENOUS

## 2020-02-26 MED ORDER — CEFAZOLIN SODIUM-DEXTROSE 2-4 GM/100ML-% IV SOLN
2.0000 g | INTRAVENOUS | Status: AC
  Administered 2020-02-26: 2 g via INTRAVENOUS
  Filled 2020-02-26: qty 100

## 2020-02-26 MED ORDER — BUPIVACAINE-EPINEPHRINE (PF) 0.5% -1:200000 IJ SOLN
INTRAMUSCULAR | Status: DC | PRN
  Administered 2020-02-26: 25 mL via PERINEURAL
  Administered 2020-02-26: 15 mL via PERINEURAL

## 2020-02-26 MED ORDER — ACETAMINOPHEN 500 MG PO TABS
500.0000 mg | ORAL_TABLET | Freq: Four times a day (QID) | ORAL | Status: AC
  Administered 2020-02-26 – 2020-02-27 (×4): 500 mg via ORAL
  Filled 2020-02-26 (×4): qty 1

## 2020-02-26 MED ORDER — MAGNESIUM OXIDE 400 (241.3 MG) MG PO TABS
800.0000 mg | ORAL_TABLET | Freq: Two times a day (BID) | ORAL | Status: DC
  Administered 2020-02-26 – 2020-02-28 (×4): 800 mg via ORAL
  Filled 2020-02-26 (×6): qty 2

## 2020-02-26 MED ORDER — LISINOPRIL 2.5 MG PO TABS
2.5000 mg | ORAL_TABLET | Freq: Every day | ORAL | Status: DC
  Administered 2020-02-26 – 2020-02-28 (×3): 2.5 mg via ORAL
  Filled 2020-02-26 (×3): qty 1

## 2020-02-26 MED ORDER — ENOXAPARIN SODIUM 40 MG/0.4ML ~~LOC~~ SOLN
40.0000 mg | SUBCUTANEOUS | Status: DC
  Administered 2020-02-27: 40 mg via SUBCUTANEOUS
  Filled 2020-02-26 (×3): qty 0.4

## 2020-02-26 MED ORDER — ONDANSETRON HCL 4 MG/2ML IJ SOLN
INTRAMUSCULAR | Status: AC
  Filled 2020-02-26: qty 2

## 2020-02-26 MED ORDER — WARFARIN 0.5 MG HALF TABLET: 0.5000 mg | ORAL_TABLET | ORAL | Status: DC

## 2020-02-26 MED ORDER — WARFARIN - PHYSICIAN DOSING INPATIENT: Freq: Every day | Status: DC

## 2020-02-26 MED ORDER — ONDANSETRON HCL 4 MG PO TABS: 4.0000 mg | ORAL_TABLET | Freq: Four times a day (QID) | ORAL | Status: DC | PRN

## 2020-02-26 MED ORDER — WARFARIN SODIUM 5 MG PO TABS
5.0000 mg | ORAL_TABLET | ORAL | Status: DC
  Administered 2020-02-26: 5 mg via ORAL
  Filled 2020-02-26 (×2): qty 1

## 2020-02-26 MED ORDER — MIDAZOLAM HCL 2 MG/2ML IJ SOLN
INTRAMUSCULAR | Status: AC
  Administered 2020-02-26: 1 mg via INTRAVENOUS
  Filled 2020-02-26: qty 2

## 2020-02-26 MED ORDER — OXYCODONE HCL 5 MG PO TABS: 5.0000 mg | ORAL_TABLET | Freq: Once | ORAL | Status: DC | PRN

## 2020-02-26 MED ORDER — FENTANYL CITRATE (PF) 100 MCG/2ML IJ SOLN: 50.0000 ug | Freq: Once | INTRAMUSCULAR | Status: AC

## 2020-02-26 MED ORDER — FERROUS SULFATE 325 (65 FE) MG PO TABS
325.0000 mg | ORAL_TABLET | Freq: Every day | ORAL | Status: DC
  Administered 2020-02-27 – 2020-02-28 (×2): 325 mg via ORAL
  Filled 2020-02-26 (×2): qty 1

## 2020-02-26 MED ORDER — PROPOFOL 10 MG/ML IV BOLUS
INTRAVENOUS | Status: DC | PRN
  Administered 2020-02-26: 120 mg via INTRAVENOUS

## 2020-02-26 MED ORDER — CHLORHEXIDINE GLUCONATE 0.12 % MT SOLN: 15.0000 mL | Freq: Once | OROMUCOSAL | Status: AC

## 2020-02-26 MED ORDER — OXYCODONE HCL 5 MG/5ML PO SOLN: 5.0000 mg | Freq: Once | ORAL | Status: DC | PRN

## 2020-02-26 MED ORDER — INSULIN ASPART 100 UNIT/ML ~~LOC~~ SOLN
0.0000 [IU] | Freq: Every day | SUBCUTANEOUS | Status: DC
  Administered 2020-02-27: 2 [IU] via SUBCUTANEOUS

## 2020-02-26 MED ORDER — MIDAZOLAM HCL 2 MG/2ML IJ SOLN: 1.0000 mg | Freq: Once | INTRAMUSCULAR | Status: AC

## 2020-02-26 MED ORDER — FENTANYL CITRATE (PF) 100 MCG/2ML IJ SOLN
INTRAMUSCULAR | Status: AC
  Administered 2020-02-26: 50 ug via INTRAVENOUS
  Filled 2020-02-26: qty 2

## 2020-02-26 MED ORDER — ALBUTEROL SULFATE (2.5 MG/3ML) 0.083% IN NEBU: 2.5000 mg | INHALATION_SOLUTION | Freq: Four times a day (QID) | RESPIRATORY_TRACT | Status: DC | PRN

## 2020-02-26 MED ORDER — LIDOCAINE 2% (20 MG/ML) 5 ML SYRINGE
INTRAMUSCULAR | Status: DC | PRN
  Administered 2020-02-26: 50 mg via INTRAVENOUS

## 2020-02-26 MED ORDER — DULAGLUTIDE 1.5 MG/0.5ML ~~LOC~~ SOAJ: 1.5000 mg | SUBCUTANEOUS | Status: DC

## 2020-02-26 MED ORDER — PANTOPRAZOLE SODIUM 40 MG PO TBEC
40.0000 mg | DELAYED_RELEASE_TABLET | Freq: Every day | ORAL | Status: DC
  Administered 2020-02-27: 40 mg via ORAL
  Filled 2020-02-26: qty 1

## 2020-02-26 MED ORDER — DEXAMETHASONE SODIUM PHOSPHATE 10 MG/ML IJ SOLN
INTRAMUSCULAR | Status: AC
  Filled 2020-02-26: qty 1

## 2020-02-26 MED ORDER — WARFARIN SODIUM 5 MG PO TABS
5.5000 mg | ORAL_TABLET | ORAL | Status: DC
  Administered 2020-02-27: 5.5 mg via ORAL
  Filled 2020-02-26: qty 1

## 2020-02-26 MED ORDER — HYDROCODONE-ACETAMINOPHEN 7.5-325 MG PO TABS
1.0000 | ORAL_TABLET | ORAL | Status: DC | PRN
  Administered 2020-02-27: 1 via ORAL
  Administered 2020-02-28: 2 via ORAL
  Filled 2020-02-26: qty 1
  Filled 2020-02-26: qty 2
  Filled 2020-02-26: qty 1

## 2020-02-26 MED ORDER — HYDROCODONE-ACETAMINOPHEN 5-325 MG PO TABS: 1.0000 | ORAL_TABLET | ORAL | Status: DC | PRN

## 2020-02-26 MED ORDER — FESOTERODINE FUMARATE ER 4 MG PO TB24
4.0000 mg | ORAL_TABLET | Freq: Every day | ORAL | Status: DC
  Administered 2020-02-27 – 2020-02-28 (×2): 4 mg via ORAL
  Filled 2020-02-26 (×3): qty 1

## 2020-02-26 MED ORDER — LACTATED RINGERS IV SOLN: INTRAVENOUS | Status: DC

## 2020-02-26 MED ORDER — SENNOSIDES-DOCUSATE SODIUM 8.6-50 MG PO TABS
1.0000 | ORAL_TABLET | Freq: Every day | ORAL | Status: DC
  Administered 2020-02-26: 1 via ORAL
  Filled 2020-02-26 (×2): qty 1

## 2020-02-26 MED ORDER — POLYETHYLENE GLYCOL 3350 17 G PO PACK
17.0000 g | PACK | Freq: Every day | ORAL | Status: DC
  Administered 2020-02-27: 17 g via ORAL
  Filled 2020-02-26: qty 1

## 2020-02-26 MED ORDER — FENTANYL CITRATE (PF) 100 MCG/2ML IJ SOLN: 25.0000 ug | INTRAMUSCULAR | Status: DC | PRN

## 2020-02-26 MED ORDER — PROPOFOL 10 MG/ML IV BOLUS
INTRAVENOUS | Status: AC
  Filled 2020-02-26: qty 20

## 2020-02-26 MED ORDER — MORPHINE SULFATE (PF) 2 MG/ML IV SOLN
0.5000 mg | INTRAVENOUS | Status: DC | PRN
  Administered 2020-02-27: 1 mg via INTRAVENOUS
  Filled 2020-02-26: qty 1

## 2020-02-26 MED ORDER — PHENYLEPHRINE HCL-NACL 10-0.9 MG/250ML-% IV SOLN
INTRAVENOUS | Status: DC | PRN
  Administered 2020-02-26: 100 ug/min via INTRAVENOUS

## 2020-02-26 MED ORDER — ASPIRIN EC 81 MG PO TBEC
81.0000 mg | DELAYED_RELEASE_TABLET | Freq: Every day | ORAL | Status: DC
  Administered 2020-02-27 – 2020-02-28 (×2): 81 mg via ORAL
  Filled 2020-02-26 (×2): qty 1

## 2020-02-26 MED ORDER — ATORVASTATIN CALCIUM 40 MG PO TABS
40.0000 mg | ORAL_TABLET | Freq: Every day | ORAL | Status: DC
  Administered 2020-02-26 – 2020-02-27 (×2): 40 mg via ORAL
  Filled 2020-02-26 (×2): qty 1

## 2020-02-26 MED ORDER — IPRATROPIUM-ALBUTEROL 0.5-2.5 (3) MG/3ML IN SOLN: 3.0000 mL | Freq: Four times a day (QID) | RESPIRATORY_TRACT | Status: DC | PRN

## 2020-02-26 SURGICAL SUPPLY — 52 items
BANDAGE ESMARK 6X9 LF (GAUZE/BANDAGES/DRESSINGS) IMPLANT
BIT DRILL 2.5 CANN LNG (BIT) ×3 IMPLANT
BIT DRILL 2.5 CANN STRL (BIT) ×3 IMPLANT
BNDG CMPR MED 10X6 ELC LF (GAUZE/BANDAGES/DRESSINGS) ×1
BNDG ELASTIC 4X5.8 VLCR STR LF (GAUZE/BANDAGES/DRESSINGS) ×3 IMPLANT
BNDG ELASTIC 6X10 VLCR STRL LF (GAUZE/BANDAGES/DRESSINGS) ×3 IMPLANT
BNDG ESMARK 6X9 LF (GAUZE/BANDAGES/DRESSINGS)
CANISTER SUCT 3000ML PPV (MISCELLANEOUS) ×3 IMPLANT
COVER SURGICAL LIGHT HANDLE (MISCELLANEOUS) ×3 IMPLANT
CUFF TOURN SGL QUICK 34 (TOURNIQUET CUFF) ×3
CUFF TRNQT CYL 34X4.125X (TOURNIQUET CUFF) ×1 IMPLANT
DRAPE C-ARM 42X72 X-RAY (DRAPES) ×3 IMPLANT
DRAPE C-ARMOR (DRAPES) ×3 IMPLANT
DRAPE U-SHAPE 47X51 STRL (DRAPES) ×3 IMPLANT
DRSG ADAPTIC 3X8 NADH LF (GAUZE/BANDAGES/DRESSINGS) ×3 IMPLANT
DURAPREP 26ML APPLICATOR (WOUND CARE) ×3 IMPLANT
ELECT REM PT RETURN 9FT ADLT (ELECTROSURGICAL) ×3
ELECTRODE REM PT RTRN 9FT ADLT (ELECTROSURGICAL) ×1 IMPLANT
GAUZE SPONGE 4X4 12PLY STRL LF (GAUZE/BANDAGES/DRESSINGS) ×3 IMPLANT
GLOVE BIO SURGEON STRL SZ7.5 (GLOVE) ×3 IMPLANT
GLOVE BIOGEL PI IND STRL 8 (GLOVE) ×1 IMPLANT
GLOVE BIOGEL PI INDICATOR 8 (GLOVE) ×2
GOWN STRL REUS W/ TWL LRG LVL3 (GOWN DISPOSABLE) ×2 IMPLANT
GOWN STRL REUS W/ TWL XL LVL3 (GOWN DISPOSABLE) ×1 IMPLANT
GOWN STRL REUS W/TWL LRG LVL3 (GOWN DISPOSABLE) ×6
GOWN STRL REUS W/TWL XL LVL3 (GOWN DISPOSABLE) ×3
GUIDEWIRE 1.35MM (WIRE) ×6 IMPLANT
KIT BASIN OR (CUSTOM PROCEDURE TRAY) ×3 IMPLANT
KIT TURNOVER KIT B (KITS) ×3 IMPLANT
NS IRRIG 1000ML POUR BTL (IV SOLUTION) ×3 IMPLANT
PACK ORTHO EXTREMITY (CUSTOM PROCEDURE TRAY) ×3 IMPLANT
PAD CAST 4YDX4 CTTN HI CHSV (CAST SUPPLIES) ×1 IMPLANT
PADDING CAST COTTON 4X4 STRL (CAST SUPPLIES) ×3
PADDING CAST SYN 6 (CAST SUPPLIES) ×2
PADDING CAST SYNTHETIC 6X4 NS (CAST SUPPLIES) ×1 IMPLANT
PLATE THIRD TUBULAR 8 HOLE (Plate) ×3 IMPLANT
SCREW COMP KREULOCK 3.5X18 (Screw) ×6 IMPLANT
SCREW LOW PROFILE 3.5X14 (Screw) ×3 IMPLANT
SCREW LOW PROFILE 4.0X40 (Screw) ×6 IMPLANT
SCREW NON-LOCKING 3.5X12MM (Screw) ×9 IMPLANT
SCREW NON-LOCKING 3.5X20 ANKLE (Screw) ×3 IMPLANT
SPLINT PLASTER CAST XFAST 5X30 (CAST SUPPLIES) ×1 IMPLANT
SPLINT PLASTER XFAST SET 5X30 (CAST SUPPLIES) ×2
SUT ETHILON 3 0 PS 1 (SUTURE) ×3 IMPLANT
SUT VIC AB 0 CT1 27 (SUTURE)
SUT VIC AB 0 CT1 27XBRD ANBCTR (SUTURE) IMPLANT
SUT VIC AB 2-0 CT1 27 (SUTURE) ×3
SUT VIC AB 2-0 CT1 TAPERPNT 27 (SUTURE) ×1 IMPLANT
TOWEL GREEN STERILE (TOWEL DISPOSABLE) ×6 IMPLANT
TUBE CONNECTING 12'X1/4 (SUCTIONS) ×1
TUBE CONNECTING 12X1/4 (SUCTIONS) ×2 IMPLANT
YANKAUER SUCT BULB TIP NO VENT (SUCTIONS) ×3 IMPLANT

## 2020-02-26 NOTE — Anesthesia Procedure Notes (Signed)
Anesthesia Regional Block: Popliteal block   Pre-Anesthetic Checklist: ,, timeout performed, Correct Patient, Correct Site, Correct Laterality, Correct Procedure, Correct Position, site marked, Risks and benefits discussed,  Surgical consent,  Pre-op evaluation,  At surgeon's request and post-op pain management  Laterality: Left  Prep: chloraprep       Needles:  Injection technique: Single-shot  Needle Type: Echogenic Stimulator Needle          Additional Needles:   Procedures:, nerve stimulator,,,,,,,   Nerve Stimulator or Paresthesia:  Response: plantar flexion of foot, 0.45 mA,   Additional Responses:   Narrative:  Start time: 02/26/2020 1:20 PM End time: 02/26/2020 1:29 PM Injection made incrementally with aspirations every 5 mL.  Performed by: Personally  Anesthesiologist: Albertha Ghee, MD  Additional Notes: Functioning IV was confirmed and monitors were applied.  A 78mm 21ga Arrow echogenic stimulator needle was used. Sterile prep and drape,hand hygiene and sterile gloves were used.  Negative aspiration and negative test dose prior to incremental administration of local anesthetic. The patient tolerated the procedure well.  Ultrasound guidance: relevent anatomy identified, needle position confirmed, local anesthetic spread visualized around nerve(s), vascular puncture avoided.  Image printed for medical record.

## 2020-02-26 NOTE — Op Note (Signed)
   Date of Surgery: 02/26/2020  INDICATIONS: Pamela [name] is a 74 y.o.-year-old female who sustained a left bimalleolar ankle fracture; she was indicated for open reduction and internal fixation due to the displaced nature of the articular fracture and came to the operating room today for this procedure. The patient did consent to the procedure after discussion of the risks and benefits.  PREOPERATIVE DIAGNOSIS: left closed bimalleolar ankle fracture  POSTOPERATIVE DIAGNOSIS: Same.  PROCEDURE: Open treatment of left ankle fracture with internal fixation. Bimalleolar CPT [phone]  SURGEON: Dannielle Karvonen. Stann Mainland, M.D.  ASSIST: Katy Apo, RNFA.  ANESTHESIA:  general  TOURNIQUET TIME: 60 min @ 300 mmHg  IV FLUIDS AND URINE: See anesthesia.  ESTIMATED BLOOD LOSS: 10 mL.  IMPLANTS: Arthrex 1/3 tubular locking plate with distal locking screws and proximal nonlocking 3.5 mm cortical screws x 4  COMPLICATIONS: None.  DESCRIPTION OF PROCEDURE: The patient was brought to the operating room and placed supine on the operating table.  The patient had been signed prior to the procedure and this was documented. The patient had the anesthesia placed by the anesthesiologist.  A nonsterile tourniquet was placed on the upper thigh.  The prep verification and incision time-outs were performed to confirm that this was the correct patient, site, side and location. The patient had an SCD on the opposite lower extremity. The patient did receive antibiotics prior to the incision and was re-dosed during the procedure as needed at indicated intervals.  The patient had the lower extremity prepped and draped in the standard surgical fashion.  The extremity was exsanguinated using an esmarch bandage and the tourniquet was inflated to 300 mm Hg.   Incision was made over the distal fibula and the fracture was exposed and reduced anatomically with a clamp. I then applied a 1/3 tubular locking plate and secured it proximally and  distally with non-locking screws. Bone quality was poor and there was some comminution. I used c-arm to confirm satisfactory reduction and fixation.   I then turned my attention to the medial malleolus. Incision was made over the medial malleolus and the fracture exposed and held provisionally with a clamp. 2 guidepins were placed for the 4.0 mm cannulated screws and then confirmation of reduction was made with fluoroscopy. I then placed 2  25mm screws which had satisfactory fixation.   The syndesmosis was stressed using live fluoroscopy and found to be stable.   The wounds were irrigated, and closed with vicryl with routine closure for the skin. The wounds were injected with local anesthetic. Sterile gauze was applied followed by a posterior splint. She was awakened and returned to the PACU in stable and satisfactory condition. There were no complications.  All counts were correct times 2.  POSTOPERATIVE PLAN: Ms. Schlotterbeck will remain nonweightbearing on this leg for approximately 6 weeks; Ms. Cadogan will return for suture removal in 2 weeks. She will be admitted for PT and post op care.  Ms. Defino will receive DVT prophylaxis with lovenox bridged to coumadin.  Geralynn Rile, MD [facility] [phone] 4:31 PM

## 2020-02-26 NOTE — Brief Op Note (Signed)
02/26/2020  4:29 PM  PATIENT:  Pamela [name]  74 y.o. female  PRE-OPERATIVE DIAGNOSIS:  Left ankle bimal fracture  POST-OPERATIVE DIAGNOSIS:  Left ankle bimal fracture  PROCEDURE:  Procedure(s) with comments: OPEN REDUCTION INTERNAL FIXATION (ORIF) ANKLE FRACTURE (Left) - 90 mins  SURGEON:  Surgeon(s) and Role:    * Nicholes Stairs, MD - Primary  PHYSICIAN ASSISTANT:   ASSISTANTS: Katy Apo, RNFA   ANESTHESIA:   regional and general  EBL:  10 cc  BLOOD ADMINISTERED:none  DRAINS: none   LOCAL MEDICATIONS USED:  NONE  SPECIMEN:  No Specimen  DISPOSITION OF SPECIMEN:  N/A  COUNTS:  YES  TOURNIQUET:  * Missing tourniquet times found for documented tourniquets in log: [id number] *  DICTATION: .Note written in EPIC  PLAN OF CARE: Admit for overnight observation  PATIENT DISPOSITION:  PACU - hemodynamically stable.   Delay start of Pharmacological VTE agent (>24hrs) due to surgical blood loss or risk of bleeding: not applicable

## 2020-02-26 NOTE — Transfer of Care (Signed)
Immediate Anesthesia Transfer of Care Note  Patient: Pamela [name]  Procedure(s) Performed: OPEN REDUCTION INTERNAL FIXATION (ORIF) ANKLE FRACTURE (Left Ankle)  Patient Location: PACU  Anesthesia Type:General and Regional  Level of Consciousness: drowsy  Airway & Oxygen Therapy: Patient Spontanous Breathing  Post-op Assessment: Report given to RN  Post vital signs: Reviewed and stable  Last Vitals:  Vitals Value Taken Time  BP    Temp    Pulse 68 02/26/20 1640  Resp 13 02/26/20 1640  SpO2 100 % 02/26/20 1640  Vitals shown include unvalidated device data.  Last Pain:  Vitals:   02/26/20 1345  TempSrc:   PainSc: 0-No pain      Patients Stated Pain Goal: 2 (79/72/82 0601)  Complications: No complications documented.

## 2020-02-26 NOTE — Anesthesia Procedure Notes (Signed)
Anesthesia Regional Block: Adductor canal block   Pre-Anesthetic Checklist: ,, timeout performed, Correct Patient, Correct Site, Correct Laterality, Correct Procedure, Correct Position, site marked, Risks and benefits discussed,  Surgical consent,  Pre-op evaluation,  At surgeon's request and post-op pain management  Laterality: Left  Prep: chloraprep       Needles:  Injection technique: Single-shot  Needle Type: Echogenic Needle     Needle Length: 9cm  Needle Gauge: 21     Additional Needles:   Narrative:  Start time: 02/26/2020 1:30 PM End time: 02/26/2020 1:36 PM Injection made incrementally with aspirations every 5 mL.  Performed by: Personally  Anesthesiologist: Albertha Ghee, MD  Additional Notes: Pt tolerated the procedure well.

## 2020-02-26 NOTE — H&P (Signed)
ORTHOPAEDIC H and P  REQUESTING PHYSICIAN: Nicholes Stairs, MD  PCP:  Serita Grammes, MD  Chief Complaint: Left ankle fracture  HPI: Pamela [name] is a 74 y.o. female who complains of a ground-level fall resulting in fracture of the medial and lateral malleolus of the left ankle.  She was seen at the [facility] ER.  There they placed her in a splint.  Unfortunately, she went on to develop pneumonia in the interval between the fall back on 2 June and today.  She is here today for definitive fixation of this ankle.  She has since been released back to her house on room air.  She has no complaints of shortness of breath today.  She does take Coumadin on a routine level due to history of DVT.  She has been off of that since Saturday.  Past Medical History:  Diagnosis Date  . Anemia   . Asthma   . Atrial fibrillation [I48.91] 10/01/2014  . Bilateral leg cramps   . Chronic anticoagulation   . Chronic constipation   . Complication of anesthesia   . Diabetes mellitus   . Dizziness 05/23/2011  . DJD (degenerative joint disease)   . DOE (dyspnea on exertion) 03/03/2013   Onset early 2020  - 02/05/2019   Walked RA  2 laps @  approx 233ft each @ nl pace  stopped due to  Light headed, no sp or sob, no desats    . DVT (deep venous thrombosis) ([place]) 07/30/2013  . Dyspnea    occasionally  . GERD (gastroesophageal reflux disease)   . Heart murmur    as a child  . HTN (hypertension)   . Hypercholesteremia   . Hypertension 05/23/2011  . Hyponatremia 02/06/2019   Na 126 on no obvious meds to cause it with last na 133 in 2014   . Long term (current) use of anticoagulants 03/03/2013  . Low back pain 07/06/2018  . Low back strain 08/09/2018  . Obesity   . Osteoarthritis of right knee 07/06/2018  . Pain in right knee 07/06/2018  . Pneumonia   . PONV (postoperative nausea and vomiting)    needs pre-medication  . Protein S deficiency ([place])    prior DVT L knee, L arm DVT,  followed by  Dr West Pugh at [facility]  . PVC's (premature ventricular contractions)   . S/P cardiac catheterization 02/2013   Normal coronaries; low normal EF at 50%  . Sleep apnea    can't use the cpap (diagnosed 2017)  . Solitary pulmonary nodule on lung CT 02/06/2019   First noted 01/13/2014 > no change as of 12/21/2018  . Spondylolisthesis, lumbar region 08/09/2018  . Stroke [facility])    TIA in 2018 or 2019  . TIA (transient ischemic attack)    Past Surgical History:  Procedure Laterality Date  . CARPAL TUNNEL RELEASE  1994   bilateral  . CHOLECYSTECTOMY  1993  . COLONOSCOPY    . FOOT TENDON SURGERY Right   . left knee replacement  2006  . thumb surgery Left 2018  . TONSILLECTOMY    . TOTAL ABDOMINAL HYSTERECTOMY  1988   Social History   Socioeconomic History  . Marital status: Married    Spouse name: [name]  . Number of children: Not on file  . Years of education: Not on file  . Highest education level: Not on file  Occupational History  . Not on file  Tobacco Use  . Smoking status: Never Smoker  .  Smokeless tobacco: Never Used  Vaping Use  . Vaping Use: Never used  Substance and Sexual Activity  . Alcohol use: No  . Drug use: No  . Sexual activity: Not on file  Other Topics Concern  . Not on file  Social History Narrative   Lives with spouse in [place].  2 grown daughters.   Retired Glass blower/designer     Social Determinants of Radio broadcast assistant Strain:   . Difficulty of Paying Living Expenses:   Food Insecurity:   . Worried About Charity fundraiser in the Last Year:   . Arboriculturist in the Last Year:   Transportation Needs:   . Film/video editor (Medical):   Marland Kitchen Lack of Transportation (Non-Medical):   Physical Activity:   . Days of Exercise per Week:   . Minutes of Exercise per Session:   Stress:   . Feeling of Stress :   Social Connections:   . Frequency of Communication with Friends and Family:   . Frequency of Social Gatherings with Friends and Family:    . Attends Religious Services:   . Active Member of Clubs or Organizations:   . Attends Archivist Meetings:   Marland Kitchen Marital Status:    Family History  Problem Relation Age of Onset  . Cirrhosis Mother   . Antithrombin III deficiency Mother        multiple emboli  . Ulcerative colitis Mother   . Coronary artery disease Father   . Heart attack Father   . Hypertension Father   . Protein S deficiency Sister        prior DVT  . Protein S deficiency Daughter   . Hypertension Sister   . Hypertension Brother   . Heart attack Brother   . Heart attack Sister   . Stroke Neg Hx   . Colitis Neg Hx   . Colon polyps Neg Hx   . Esophageal cancer Neg Hx   . Liver cancer Neg Hx   . Pancreatic cancer Neg Hx   . Rectal cancer Neg Hx   . Stomach cancer Neg Hx    Allergies  Allergen Reactions  . Ketek [Telithromycin] Nausea And Vomiting and Rash  . Loxapine Succinate Hives  . Naproxen Sodium Anaphylaxis and Hives  . Amoxapine And Related Hives  . Darvon Nausea And Vomiting  . Amoxicillin Rash  . Propoxyphene Nausea And Vomiting   Prior to Admission medications   Medication Sig Start Date End Date Taking? Authorizing Provider  acetaminophen (TYLENOL) 500 MG tablet Take 500-1,000 mg by mouth every 6 (six) hours as needed (for pain.).   Yes [provider]  ascorbic acid (VITAMIN C) 500 MG tablet Take 500 mg by mouth daily with lunch.   Yes [provider]  aspirin EC 81 MG tablet Take 81 mg by mouth daily.   Yes [provider]  atorvastatin (LIPITOR) 40 MG tablet Take 40 mg by mouth daily at 12 noon.    Yes [provider]  azelastine (ASTELIN) 137 MCG/SPRAY nasal spray Place 2 sprays into the nose daily as needed for rhinitis or allergies.  03/02/13  Yes [provider]  B Complex-C (B-COMPLEX WITH VITAMIN C) tablet Take 1 tablet by mouth daily with lunch.   Yes [provider]  carvedilol (COREG) 12.5 MG tablet Take 1 tablet  (12.5 mg total) by mouth 2 (two) times daily. 01/07/20 04/06/20 Yes Tobb, Kardie, DO  cefdinir (OMNICEF) 300 MG  capsule Take 300 mg by mouth 2 (two) times daily.   Yes [provider]  Cholecalciferol (VITAMIN D3) 50 MCG (2000 UT) TABS Take 2,000 Units by mouth daily with lunch.    Yes [provider]  denosumab (PROLIA) 60 MG/ML SOSY injection Inject 60 mg into the skin every 6 (six) months.   Yes [provider]  doxycycline (VIBRAMYCIN) 100 MG capsule Take 100 mg by mouth 2 (two) times daily.   Yes [provider]  Dulaglutide (TRULICITY) 1.5 HW/2.9HB SOPN Inject 1.5 mg into the skin every Thursday.    Yes [provider]  ferrous sulfate 325 (65 FE) MG tablet Take 325 mg by mouth daily.   Yes [provider]  Insulin Glargine, 1 Unit Dial, (TOUJEO SOLOSTAR) 300 UNIT/ML SOPN Inject 17 Units into the skin daily.    Yes [provider]  lisinopril (ZESTRIL) 2.5 MG tablet Take 2.5 mg by mouth daily.  09/12/19  Yes [provider]  Magnesium Oxide 400 (240 Mg) MG TABS Take 800 mg by mouth 2 (two) times daily. 01/25/20  Yes [provider]  Menthol-Methyl Salicylate (SALONPAS PAIN RELIEF PATCH) PTCH Apply 1 patch topically daily as needed (pain.).   Yes [provider]  ondansetron (ZOFRAN-ODT) 8 MG disintegrating tablet Take 8 mg by mouth every 8 (eight) hours as needed for nausea or vomiting.    Yes [provider]  oxyCODONE (OXY IR/ROXICODONE) 5 MG immediate release tablet Take 5 mg by mouth every 4 (four) hours as needed for pain. 02/17/20  Yes [provider]  pantoprazole (PROTONIX) 40 MG tablet Take 1 tablet (40 mg total) by mouth daily. Take 30-60 min before first meal of the day Patient taking differently: Take 40 mg by mouth daily before breakfast. Take 30-60 min before first meal of the day 02/05/19  Yes Tanda Rockers, MD  pioglitazone (ACTOS) 30 MG tablet Take 30 mg by mouth daily.    Yes  [provider]  polyethylene glycol (MIRALAX / GLYCOLAX) 17 g packet Take 17 g by mouth daily.   Yes [provider]  senna-docusate (SENOKOT S) 8.6-50 MG tablet Take 1 tablet by mouth at bedtime.   Yes [provider]  sodium chloride (OCEAN) 0.65 % SOLN nasal spray Place 1-2 sprays into both nostrils 4 (four) times daily as needed for congestion.   Yes [provider]  tolterodine (DETROL LA) 2 MG 24 hr capsule Take 2 mg by mouth at bedtime.  10/07/19  Yes [provider]  warfarin (COUMADIN) 1 MG tablet Take 0.5 mg by mouth See admin instructions. Take 0.5 tablet (0.5 mg) with a 5 mg tablet on Sundays, Tuesdays, Thursdays, Saturdays.   Yes [provider]  warfarin (COUMADIN) 5 MG tablet Take 5 mg by mouth daily.   Yes [provider]  albuterol (VENTOLIN HFA) 108 (90 Base) MCG/ACT inhaler Inhale 1-2 puffs into the lungs every 6 (six) hours as needed for wheezing or shortness of breath.  09/17/19   [provider]  furosemide (LASIX) 20 MG tablet Take 20 mg by mouth daily. 01/01/20   [provider]  ipratropium-albuterol (DUONEB) 0.5-2.5 (3) MG/3ML SOLN Take 3 mLs by nebulization 4 (four) times daily as needed (wheezing/shortness of breath).  11/07/17   [provider]   No results found.  Positive ROS: All other systems have been reviewed and were otherwise negative with the exception of those mentioned in the HPI and as above.  Physical  Exam: General: Alert, no acute distress Cardiovascular: No pedal edema Respiratory: No cyanosis, no use of accessory musculature GI: No organomegaly, abdomen is soft and non-tender Skin: No lesions in the area of chief complaint Neurologic: Sensation intact distally Psychiatric: Patient is competent for consent with normal mood and affect Lymphatic: No axillary or cervical lymphadenopathy  MUSCULOSKELETAL:  Left lower extremity:  Nicely padded splint in place.   Wrinkle sign noted.  Toes warm and well-perfused.  Sensation intact light touch in the deep and superficial peroneal nerve.  No pain with passive stretch.  Assessment: Left ankle closed bimalleolar fracture.  Plan: -Our plan will be to proceed with open reduction and internal fixation of this unstable bimalleolar ankle fracture.  We discussed the risk and benefits of this procedure at length including but not limited to bleeding, infection, damage to surrounding neurovascular structures, stiffness, posttraumatic arthrosis, hardware failure, nonunion, malunion, DVT as well as PE and the risk of anesthesia.  She has provided informed consent.  -She will need to be admitted postoperatively for at least 1 night if not multiple nights depending on her initial evaluation with physical therapy and her appropriateness to discharge to home.  She will be nonweightbearing to the left lower extremity for approximately 6 weeks.    Nicholes Stairs, MD Cell [phone]    02/26/2020 3:00 PM

## 2020-02-26 NOTE — Anesthesia Procedure Notes (Signed)
Procedure Name: LMA Insertion Date/Time: 02/26/2020 3:18 PM Performed by: Barrington Ellison, CRNA Pre-anesthesia Checklist: Patient identified, Emergency Drugs available, Suction available and Patient being monitored Patient Re-evaluated:Patient Re-evaluated prior to induction Oxygen Delivery Method: Circle System Utilized Preoxygenation: Pre-oxygenation with 100% oxygen Induction Type: IV induction Ventilation: Mask ventilation without difficulty LMA: LMA inserted LMA Size: 4.0 Number of attempts: 1 Placement Confirmation: positive ETCO2 Tube secured with: Tape Dental Injury: Teeth and Oropharynx as per pre-operative assessment

## 2020-02-26 NOTE — Anesthesia Postprocedure Evaluation (Signed)
Anesthesia Post Note  Patient: Pamela [name]  Procedure(s) Performed: OPEN REDUCTION INTERNAL FIXATION (ORIF) ANKLE FRACTURE (Left Ankle)     Patient location during evaluation: PACU Anesthesia Type: General Level of consciousness: awake Pain management: pain level controlled Vital Signs Assessment: post-procedure vital signs reviewed and stable Respiratory status: spontaneous breathing Cardiovascular status: stable Postop Assessment: no apparent nausea or vomiting Anesthetic complications: no   No complications documented.  Last Vitals:  Vitals:   02/26/20 1655 02/26/20 1710  BP: (!) 189/64 (!) 190/64  Pulse: 66 64  Resp: 13 16  Temp:    SpO2: 99% 98%    Last Pain:  Vitals:   02/26/20 1710  TempSrc:   PainSc: 0-No pain                 Alonda Weaber

## 2020-02-27 ENCOUNTER — Encounter (HOSPITAL_COMMUNITY): Payer: Self-pay | Admitting: Orthopedic Surgery

## 2020-02-27 DIAGNOSIS — E669 Obesity, unspecified: Secondary | ICD-10-CM | POA: Diagnosis not present

## 2020-02-27 DIAGNOSIS — S82842A Displaced bimalleolar fracture of left lower leg, initial encounter for closed fracture: Secondary | ICD-10-CM | POA: Diagnosis not present

## 2020-02-27 DIAGNOSIS — D6859 Other primary thrombophilia: Secondary | ICD-10-CM | POA: Diagnosis not present

## 2020-02-27 DIAGNOSIS — J45909 Unspecified asthma, uncomplicated: Secondary | ICD-10-CM | POA: Diagnosis not present

## 2020-02-27 DIAGNOSIS — R079 Chest pain, unspecified: Secondary | ICD-10-CM | POA: Diagnosis not present

## 2020-02-27 DIAGNOSIS — I10 Essential (primary) hypertension: Secondary | ICD-10-CM | POA: Diagnosis not present

## 2020-02-27 DIAGNOSIS — G4730 Sleep apnea, unspecified: Secondary | ICD-10-CM | POA: Diagnosis not present

## 2020-02-27 DIAGNOSIS — I4891 Unspecified atrial fibrillation: Secondary | ICD-10-CM | POA: Diagnosis not present

## 2020-02-27 DIAGNOSIS — E119 Type 2 diabetes mellitus without complications: Secondary | ICD-10-CM | POA: Diagnosis not present

## 2020-02-27 LAB — GLUCOSE, CAPILLARY
Glucose-Capillary: 275 mg/dL — ABNORMAL HIGH (ref 70–99)
Glucose-Capillary: 252 mg/dL — ABNORMAL HIGH (ref 70–99)
Glucose-Capillary: 154 mg/dL — ABNORMAL HIGH (ref 70–99)
Glucose-Capillary: 207 mg/dL — ABNORMAL HIGH (ref 70–99)

## 2020-02-27 MED ORDER — METHOCARBAMOL 500 MG PO TABS
500.0000 mg | ORAL_TABLET | Freq: Four times a day (QID) | ORAL | Status: DC | PRN
  Administered 2020-02-27: 500 mg via ORAL
  Filled 2020-02-27 (×2): qty 1

## 2020-02-27 NOTE — TOC Initial Note (Signed)
Transition of Care [facility]) - Initial/Assessment Note    Patient Details  Name: Pamela [name] MRN: [id number] Date of Birth: 09/21/1945  [facility]) CM/SW Contact:    Angelita Ingles, RN Phone Number: [phone]  02/27/2020, 2:46 PM  Clinical Narrative:    CM consulted for Home health PT services. Patient prefers [facility] and states that she was active with them prior to hospital admission. CM spoke with [name] at [facility] who states that the  case for this patient was never opened. [name] states that she will have to ask the PT administrator if they are able to take this case and the PT administrator is currently not in. [name] will call CM back later. Bedside nurse updated.                 Expected Discharge Plan: [place] Barriers to Discharge: Continued Medical Work up   Patient Goals and CMS Choice Patient states their goals for this hospitalization and ongoing recovery are:: Wants to go home with [facility] CMS Medicare.gov Compare Post Acute Care list provided to:: Patient Choice offered to / list presented to : Patient  Expected Discharge Plan and Services Expected Discharge Plan: [place] In-house Referral: NA Discharge Planning Services: CM Consult Post Acute Care Choice: [place] arrangements for the past 2 months: Single Family Home                 DME Arranged: N/A DME Agency: NA       HH Arranged: PT (pending) [place] Agency: Other - See comment ([place]  notified services pending) Date HH Agency Contacted: 02/27/20 Time HH Agency Contacted: [phone] Representative spoke with at [place]: [name]  Prior Living Arrangements/Services Living arrangements for the past 2 months: [place] with:: Self Patient language and need for interpreter reviewed:: Yes Do you feel safe going back to the place where you live?: Yes      Need  for Family Participation in Patient Care: No (Comment) Care giver support system in place?: No (comment)   Criminal Activity/Legal Involvement Pertinent to Current Situation/Hospitalization: No - Comment as needed  Activities of Daily Living Home Assistive Devices/Equipment: Wheelchair, Environmental consultant (specify type), CBG Meter, Blood pressure cuff ADL Screening (condition at time of admission) Patient's cognitive ability adequate to safely complete daily activities?: Yes Is the patient deaf or have difficulty hearing?: No Does the patient have difficulty seeing, even when wearing glasses/contacts?: No Does the patient have difficulty concentrating, remembering, or making decisions?: No Patient able to express need for assistance with ADLs?: Yes Does the patient have difficulty dressing or bathing?: No Independently performs ADLs?: Yes (appropriate for developmental age) Does the patient have difficulty walking or climbing stairs?: Yes Weakness of Legs: Left Weakness of Arms/Hands: Both  Permission Sought/Granted   Permission granted to share information with : No              Emotional Assessment Appearance:: Other (Comment Required (phone conversation) Attitude/Demeanor/Rapport: Gracious Affect (typically observed): Appropriate Orientation: : Oriented to Self, Oriented to Place, Oriented to  Time, Oriented to Situation Alcohol / Substance Use: Not Applicable Psych Involvement: No (comment)  Admission diagnosis:  Bimalleolar ankle fracture, left, closed, initial encounter [O67.124P] Patient Active Problem List   Diagnosis Date Noted  . Bimalleolar ankle fracture, left, closed, initial encounter 02/26/2020  . Hyponatremia 02/06/2019  . Solitary pulmonary nodule on lung  CT 02/06/2019  . Spondylolisthesis, lumbar region 08/09/2018  . Low back strain 08/09/2018  . Osteoarthritis of right knee 07/06/2018  . Low back pain 07/06/2018  . Pain in right knee 07/06/2018  . Atrial  fibrillation [I48.91] 10/01/2014  . DVT (deep venous thrombosis) ([place]) 07/30/2013  . TIA (transient ischemic attack) 07/30/2013  . 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 Long term (current) use of anticoagulants 03/03/2013  . DOE (dyspnea on exertion) 03/03/2013  . PVC's (premature ventricular contractions) 05/23/2011  . Dizziness 05/23/2011  . Hypertension 05/23/2011   PCP:  Serita Grammes, MD Pharmacy:   [facility], [place] AT [place] [address] [place] [address] Phone: [phone] Fax: [phone]     Social Determinants of Health (SDOH) Interventions    Readmission Risk Interventions No flowsheet data found.

## 2020-02-27 NOTE — Evaluation (Signed)
Physical Therapy Evaluation Patient Details Name: Pamela [name] MRN: [id number] DOB: 1946/05/27 Today's Date: 02/27/2020   History of Present Illness  Pt is a 74 y/o female s/p L ankle fx ORIF due to a mechanical fall. PMH including but not limited to a-fib, DM, HTN and CVA.    Clinical Impression  Pt presented in bathroom seated on toilet with nurse tech and RN present, awake and willing to participate in therapy session. Pt's daughter present throughout session as well. Prior to admission, pt was ambulating short distances with a RW and using a w/c for mobility as well. Prior to her fall she ambulated with use of a cane and was otherwise completely independent. At the time of evaluation, pt very limited with mobility overall secondary to fatigue and weakness. She was able to perform bed mobility with supervision, transfers with supervision and very short distance ambulation (hopping on R LE with RW) and min guard. She required seated rest breaks throughout secondary to fatigue. Based on pt's current functional mobility status, great support at home and DME at home, would recommend pt d/c home with HHPT services. Pt would continue to benefit from skilled physical therapy services at this time while admitted and after d/c to address the below listed limitations in order to improve overall safety and independence with functional mobility.     Follow Up Recommendations Home health PT;Supervision/Assistance - 24 hour    Equipment Recommendations  3in1 (PT)    Recommendations for Other Services       Precautions / Restrictions Precautions Precautions: Fall Restrictions Weight Bearing Restrictions: Yes LLE Weight Bearing: Non weight bearing      Mobility  Bed Mobility Overal bed mobility: Needs Assistance Bed Mobility: Sit to Supine       Sit to supine: Supervision   General bed mobility comments: pt using bilateral UEs to assist L LE back onto  bed  Transfers Overall transfer level: Needs assistance Equipment used: Rolling walker (2 wheeled) Transfers: Sit to/from Stand Sit to Stand: Supervision         General transfer comment: good technique utilized, steady with transitions  Ambulation/Gait Ambulation/Gait assistance: Min guard Gait Distance (Feet): 5 Feet (5' x2 with sitting rest break) Assistive device: Rolling walker (2 wheeled) Gait Pattern/deviations:  (hop-to on R LE) Gait velocity: decreased   General Gait Details: pt with slow, hopping on R LE, fatiguing rapidly and requiring a sitting rest breaks  Stairs            Wheelchair Mobility    Modified Rankin (Stroke Patients Only)       Balance Overall balance assessment: Needs assistance Sitting-balance support: Feet supported Sitting balance-Leahy Scale: Good     Standing balance support: During functional activity;Bilateral upper extremity supported Standing balance-Leahy Scale: Poor                               Pertinent Vitals/Pain Pain Assessment: Faces Faces Pain Scale: Hurts a little bit Pain Location: L ankle Pain Descriptors / Indicators: Sore Pain Intervention(s): Monitored during session    Home Living Family/patient expects to be discharged to:: Private residence Living Arrangements: Spouse/significant other;Children Available Help at Discharge: Family;Available 24 hours/day Type of Home: House Home Access: Level entry     Home Layout: One level Home Equipment: Walker - 2 wheels;Wheelchair - manual      Prior Function Level of Independence: Independent with assistive device(s)  Comments: has been ambulating short distances with RW and using a w/c for mobility since fall     Hand Dominance        Extremity/Trunk Assessment   Upper Extremity Assessment Upper Extremity Assessment: Overall WFL for tasks assessed    Lower Extremity Assessment Lower Extremity Assessment: LLE  deficits/detail LLE Deficits / Details: pt with short leg splint in place; no sensation to light touch on all toes; unable to flex or extend toes; able to maintain NWB'ing throughout independently LLE: Unable to fully assess due to immobilization       Communication   Communication: No difficulties  Cognition Arousal/Alertness: Awake/alert Behavior During Therapy: WFL for tasks assessed/performed Overall Cognitive Status: Within Functional Limits for tasks assessed                                        General Comments      Exercises     Assessment/Plan    PT Assessment Patient needs continued PT services  PT Problem List Decreased strength;Decreased range of motion;Decreased activity tolerance;Decreased balance;Decreased mobility;Decreased coordination;Decreased knowledge of use of DME;Decreased safety awareness;Decreased knowledge of precautions;Pain       PT Treatment Interventions DME instruction;Gait training;Stair training;Therapeutic activities;Functional mobility training;Therapeutic exercise;Balance training;Neuromuscular re-education;Patient/family education    PT Goals (Current goals can be found in the Care Plan section)  Acute Rehab PT Goals Patient Stated Goal: "home today" PT Goal Formulation: With patient/family Time For Goal Achievement: 03/12/20 Potential to Achieve Goals: Good    Frequency Min 5X/week   Barriers to discharge        Co-evaluation               AM-PAC PT "6 Clicks" Mobility  Outcome Measure Help needed turning from your back to your side while in a flat bed without using bedrails?: None Help needed moving from lying on your back to sitting on the side of a flat bed without using bedrails?: None Help needed moving to and from a bed to a chair (including a wheelchair)?: None Help needed standing up from a chair using your arms (e.g., wheelchair or bedside chair)?: None Help needed to walk in hospital room?: A  Little Help needed climbing 3-5 steps with a railing? : Total 6 Click Score: 20    End of Session Equipment Utilized During Treatment: Gait belt Activity Tolerance: Patient tolerated treatment well Patient left: in bed;with call bell/phone within reach;with family/visitor present Nurse Communication: Mobility status PT Visit Diagnosis: Other abnormalities of gait and mobility (R26.89)    Time: 2395-3202 PT Time Calculation (min) (ACUTE ONLY): 23 min   Charges:   PT Evaluation $PT Eval Moderate Complexity: 1 Mod PT Treatments $Therapeutic Activity: 8-22 mins        Anastasio Champion, DPT  Acute Rehabilitation Services Pager [phone] Office [place] 02/27/2020, 9:24 AM

## 2020-02-27 NOTE — Progress Notes (Signed)
   Subjective:  Patient reports pain as mild.  Nerve block is still active in the ankle.  She is just recently complaining of some chest pain after moving to the toilet with her walker.  She states that that pain increases when she takes a deep breath.  It is tender to palpation as well on the left and right chest wall.  Otherwise no anginal type pain.  No shortness of breath.  No numbness or tingling, no perioral anesthesia.  Objective:   VITALS:   Vitals:   02/26/20 2333 02/27/20 0423 02/27/20 0727 02/27/20 1142  BP: 128/83 (!) 175/56 (!) 153/57 (!) 157/47  Pulse: 75 71 71 73  Resp: 18 18 19 19   Temp: 97.7 F (36.5 C) 97.9 F (36.6 C) 97.6 F (36.4 C) 97.6 F (36.4 C)  TempSrc: Oral Oral Oral Oral  SpO2: 98% 93% 99% 100%  Weight:      Height:        Intact pulses distally Incision: dressing C/D/I Compartment soft -Absent sensation to light touch secondary to peripheral nerve block.  Motor output as well.  Capillary refill is less than 2 seconds.  Lab Results  Component Value Date   WBC 8.2 02/26/2020   HGB 11.3 (L) 02/26/2020   HCT 34.9 (L) 02/26/2020   MCV 95.9 02/26/2020   PLT 321 02/26/2020   BMET    Component Value Date/Time   NA 130 (L) 02/26/2020 1133   K 4.3 02/26/2020 1133   CL 92 (L) 02/26/2020 1133   CO2 26 02/26/2020 1133   GLUCOSE 157 (H) 02/26/2020 1133   BUN 15 02/26/2020 1133   CREATININE 1.10 (H) 02/26/2020 1133   CALCIUM 8.8 (L) 02/26/2020 1133   GFRNONAA 49 (L) 02/26/2020 1133   GFRAA 57 (L) 02/26/2020 1133     Assessment/Plan: 1 Day Post-Op   Active Problems:   Bimalleolar ankle fracture, left, closed, initial encounter   Up with therapy -We will plan for discharge home likely tomorrow with home health therapy at [facility].  - NWB with strict elevation  - CP seems MSk given timing after using walker and ttp on chest wall.  - will monitor CP and get 12 lead EKG if persists over the next hour  - will need another over  night stay to monitor CP and Post op pain  - dc home tomorrow with HHPT   Nicholes Stairs 02/27/2020, 1:40 PM   Geralynn Rile, MD [phone]

## 2020-02-27 NOTE — Progress Notes (Signed)
PHARMACIST - PHYSICIAN COMMUNICATION DR:  Corine Shelter CONCERNING: Pharmacy Care Issues Regarding Warfarin Labs  RECOMMENDATION (Action Taken): A protime  has been ordered for 02/28/20 to meet the [facility] National Patient safety goal and comply with the current [place].   The Pharmacy will defer all warfarin dose order changes and follow up of lab results to the prescriber unless an additional order to initiate a "pharmacy Coumadin consult" is placed.  DESCRIPTION:  While hospitalized, to be in compliance with [facility] Patient Safety Goals, all patients on warfarin must have a baseline and/or current protime prior to the administration of warfarin. Pharmacy has received your order for warfarin without these required laboratory assessments.  INR 1.2 on 6/23 and Warfarin was resumed with usual 5 mg dose (MWF) and usual 5.5 mg dose (TTSS) given today. Enoxaparin 40 mg SQ q24hrs is scheduled to begin tonight.  Arty Baumgartner, [place] Phone: [phone] 02/27/2020 4:21 PM

## 2020-02-27 NOTE — Progress Notes (Signed)
Inpatient Diabetes Program Recommendations  AACE/ADA: New Consensus Statement on Inpatient Glycemic Control (2015)  Target Ranges:  Prepandial:   less than 140 mg/dL      Peak postprandial:   less than 180 mg/dL (1-2 hours)      Critically ill patients:  140 - 180 mg/dL   Lab Results  Component Value Date   GLUCAP 252 (H) 02/27/2020   HGBA1C 7.4 (H) 02/26/2020    Review of Glycemic Control Results for TAYLR, MEUTH (MRN [id number]) as of 02/27/2020 13:38  Ref. Range 02/26/2020 21:07 02/27/2020 06:19 02/27/2020 11:38  Glucose-Capillary Latest Ref Range: 70 - 99 mg/dL 199 (H) 275 (H) 252 (H)   Diabetes history: DM 2 Outpatient Diabetes medications: Toujeo 17 units Daily, Trulicity 1.5 mg QThursday, Actos 30 mg Daily Current orders for Inpatient glycemic control:  Trulicity 1.5 mg Q Thursday, Novolo 0-15 units tid + hs, Actos 30 mg Daily  A1c 7.4% on 6/23  Inpatient Diabetes Program Recommendations:    Glucose elevated after decadron 4 me given yesterday..  Start pt's home dose of long acting insulin, Lantus 17 units starting now.  Thanks,  Tama Headings RN, MSN, BC-ADM Inpatient Diabetes Coordinator Team Pager [phone] (8a-5p)

## 2020-02-28 DIAGNOSIS — G4730 Sleep apnea, unspecified: Secondary | ICD-10-CM | POA: Diagnosis not present

## 2020-02-28 DIAGNOSIS — E119 Type 2 diabetes mellitus without complications: Secondary | ICD-10-CM | POA: Diagnosis not present

## 2020-02-28 DIAGNOSIS — S82842A Displaced bimalleolar fracture of left lower leg, initial encounter for closed fracture: Secondary | ICD-10-CM | POA: Diagnosis not present

## 2020-02-28 DIAGNOSIS — R079 Chest pain, unspecified: Secondary | ICD-10-CM | POA: Diagnosis not present

## 2020-02-28 DIAGNOSIS — J45909 Unspecified asthma, uncomplicated: Secondary | ICD-10-CM | POA: Diagnosis not present

## 2020-02-28 DIAGNOSIS — I10 Essential (primary) hypertension: Secondary | ICD-10-CM | POA: Diagnosis not present

## 2020-02-28 DIAGNOSIS — I4891 Unspecified atrial fibrillation: Secondary | ICD-10-CM | POA: Diagnosis not present

## 2020-02-28 DIAGNOSIS — E669 Obesity, unspecified: Secondary | ICD-10-CM | POA: Diagnosis not present

## 2020-02-28 DIAGNOSIS — D6859 Other primary thrombophilia: Secondary | ICD-10-CM | POA: Diagnosis not present

## 2020-02-28 LAB — GLUCOSE, CAPILLARY
Glucose-Capillary: 166 mg/dL — ABNORMAL HIGH (ref 70–99)
Glucose-Capillary: 147 mg/dL — ABNORMAL HIGH (ref 70–99)

## 2020-02-28 LAB — PROTIME-INR
INR: 1.4 — ABNORMAL HIGH (ref 0.8–1.2)
Prothrombin Time: 16.8 s — ABNORMAL HIGH (ref 11.4–15.2)

## 2020-02-28 MED ORDER — ONDANSETRON 4 MG PO TBDP: 4.0000 mg | ORAL_TABLET | Freq: Three times a day (TID) | ORAL | 0 refills | Status: DC | PRN

## 2020-02-28 MED ORDER — OXYCODONE HCL 5 MG PO TABS: 5.0000 mg | ORAL_TABLET | Freq: Four times a day (QID) | ORAL | 0 refills | Status: DC | PRN

## 2020-02-28 MED ORDER — TRAMADOL HCL 50 MG PO TABS: 50.0000 mg | ORAL_TABLET | Freq: Four times a day (QID) | ORAL | 0 refills | Status: DC | PRN

## 2020-02-28 NOTE — TOC Progression Note (Addendum)
Transition of Care [facility] - Progression Note    Patient Details  Name: Pamela [name] MRN: [id number] Date of Birth: July 02, 1946  Transition of Care [facility]) CM/SW [place], RN Phone Number: [phone] 02/28/2020, 8:31 AM  Clinical Narrative:    Home Health has been set up with [facility]. Services can not be started until Sunday 6/27 or Monday 6/28. Patient and bedside nurse updated. Information has been added to discharge instructions.   Expected Discharge Plan: [place] Barriers to Discharge: Continued Medical Work up  Expected Discharge Plan and Services Expected Discharge Plan: [place] In-house Referral: NA Discharge Planning Services: CM Consult Post Acute Care Choice: [place] arrangements for the past 2 months: Single Family Home                 DME Arranged: N/A DME Agency: NA       HH Arranged: PT (pending) HH Agency: Other - See comment ([place]  notified services pending) Date HH Agency Contacted: 02/27/20 Time [place]: [place] Representative spoke with at [place]: [place] ([place]) Interventions    Readmission Risk Interventions No flowsheet data found.

## 2020-02-28 NOTE — Progress Notes (Signed)
   Subjective:  Patient reports pain as mild.  Nerve block resolved.  No CP/SOB/N/V today.  Ready to dc. Objective:   VITALS:   Vitals:   02/27/20 1928 02/27/20 2326 02/28/20 0735 02/28/20 1148  BP: (!) 141/53 (!) 147/45 (!) 136/41 (!) 137/48  Pulse: 79 79 62 63  Resp: 18 18 18 18   Temp: 98.5 F (36.9 C) 98.3 F (36.8 C) 97.6 F (36.4 C)   TempSrc: Oral Oral Oral   SpO2: 98% 94% 97% 98%  Weight:      Height:        Intact pulses distally Incision: dressing C/D/I Compartment soft -NVI - active ROM without pain  Lab Results  Component Value Date   WBC 8.2 02/26/2020   HGB 11.3 (L) 02/26/2020   HCT 34.9 (L) 02/26/2020   MCV 95.9 02/26/2020   PLT 321 02/26/2020   BMET    Component Value Date/Time   NA 130 (L) 02/26/2020 1133   K 4.3 02/26/2020 1133   CL 92 (L) 02/26/2020 1133   CO2 26 02/26/2020 1133   GLUCOSE 157 (H) 02/26/2020 1133   BUN 15 02/26/2020 1133   CREATININE 1.10 (H) 02/26/2020 1133   CALCIUM 8.8 (L) 02/26/2020 1133   GFRNONAA 49 (L) 02/26/2020 1133   GFRAA 57 (L) 02/26/2020 1133     Assessment/Plan: 2 Days Post-Op   Active Problems:   Bimalleolar ankle fracture, left, closed, initial encounter   Up with therapy -dc home today  - NWB with strict elevation  - dc home tomorrow with HHPT   Nicholes Stairs 02/28/2020, 3:01 PM   Geralynn Rile, MD [phone]

## 2020-02-28 NOTE — Progress Notes (Signed)
Physical Therapy Treatment Patient Details Name: Pamela [name] MRN: [id number] DOB: 22-Oct-1945 Today's Date: 02/28/2020    History of Present Illness Pt is a 74 y/o female s/p L ankle fx ORIF due to a mechanical fall. PMH including but not limited to a-fib, DM, HTN and CVA.    PT Comments    Pt progressing towards physical therapy goals. She reports chest/sternal pain from RW use and mobility was limited due to this. As pt was safe with transfers and has a wheelchair to use at home if needed, we did not progress gait training. I advised pt to lower the height of her RW at home for comfort and we discussed the benefits of a tub bench as she states she cannot fit a shower chair in her shower. Pt's husband present and said he can take the doors off the tub shower to accommodate the tub bench if needed. Pt and husband were also educated on car transfer, icing schedule, and positioning recommendations. Will continue to follow and progress as able per POC.    Follow Up Recommendations  Home health PT;Supervision/Assistance - 24 hour     Equipment Recommendations  3in1 (PT), tub bench   Recommendations for Other Services       Precautions / Restrictions Precautions Precautions: Fall Restrictions Weight Bearing Restrictions: Yes LLE Weight Bearing: Non weight bearing    Mobility  Bed Mobility Overal bed mobility: Modified Independent Bed Mobility: Supine to Sit;Sit to Supine           General bed mobility comments: No assist required. Pt assisting LLE back into bed but able to adjust her position and scoot without difficulty.   Transfers Overall transfer level: Needs assistance Equipment used: Rolling walker (2 wheeled) Transfers: Sit to/from [facility] Sit to Stand: Min guard Stand pivot transfers: Min guard       General transfer comment: good technique utilized, steady with transitions. Pt was able to power-up to full stand and turn to sit  on the [facility] with min guard assist. Pt with difficulty hopping due to chest/sternal pain but was able to scoot her foot around to pivot.   Ambulation/Gait                 Stairs             Wheelchair Mobility    Modified Rankin (Stroke Patients Only)       Balance Overall balance assessment: Needs assistance Sitting-balance support: Feet supported Sitting balance-Leahy Scale: Good     Standing balance support: During functional activity;Bilateral upper extremity supported Standing balance-Leahy Scale: Poor                              Cognition Arousal/Alertness: Awake/alert Behavior During Therapy: WFL for tasks assessed/performed Overall Cognitive Status: Within Functional Limits for tasks assessed                                        Exercises      General Comments        Pertinent Vitals/Pain Pain Assessment: Faces Faces Pain Scale: Hurts even more Pain Location: Pt indicates sternal area Pain Descriptors / Indicators: Sore Pain Intervention(s): Limited activity within patient's tolerance;Monitored during session;Repositioned    Home Living  Prior Function            PT Goals (current goals can now be found in the care plan section) Acute Rehab PT Goals Patient Stated Goal: "home today" PT Goal Formulation: With patient/family Time For Goal Achievement: 03/12/20 Potential to Achieve Goals: Good Progress towards PT goals: Progressing toward goals    Frequency    Min 5X/week      PT Plan Current plan remains appropriate    Co-evaluation              AM-PAC PT "6 Clicks" Mobility   Outcome Measure  Help needed turning from your back to your side while in a flat bed without using bedrails?: None Help needed moving from lying on your back to sitting on the side of a flat bed without using bedrails?: None Help needed moving to and from a bed to a chair (including a  wheelchair)?: None Help needed standing up from a chair using your arms (e.g., wheelchair or bedside chair)?: None Help needed to walk in hospital room?: A Little Help needed climbing 3-5 steps with a railing? : Total 6 Click Score: 20    End of Session Equipment Utilized During Treatment: Gait belt Activity Tolerance: Patient tolerated treatment well Patient left: in bed;with call bell/phone within reach;with family/visitor present Nurse Communication: Mobility status PT Visit Diagnosis: Other abnormalities of gait and mobility (R26.89)     Time: 1100-1126 PT Time Calculation (min) (ACUTE ONLY): 26 min  Charges:  $Gait Training: 23-37 mins                     Rolinda Roan, PT, DPT Acute Rehabilitation Services Pager: [phone] Office: [phone]    Thelma Comp 02/28/2020, 1:31 PM

## 2020-02-28 NOTE — Discharge Instructions (Signed)
-  Nonweightbearing to left lower extremity.  You should also elevate your left lower extremity with "toes above nose."  -Continue your preoperative dosing of Coumadin.  You should also take your Lovenox bridge medication which was previously prescribed on Saturday and Sunday.  He will check your INR on Monday.  If you have any issues with INR levels you should contact your normal hematology provider.  -For mild to moderate pain use Tylenol and tramadol.  For breakthrough pain use oxycodone as necessary.  -Keep your splint clean and dry at all times.  -Return to see Dr. Stann Mainland in 2 weeks for routine postop check.

## 2020-02-28 NOTE — Discharge Summary (Signed)
Patient ID: Pamela [name] MRN: [id number] DOB/AGE: [birth year] 74 y.o.  Admit date: 02/26/2020 Discharge date:   Primary Diagnosis: Left ankle bimalleolar fracture  Admission Diagnoses:  Past Medical History:  Diagnosis Date  . Anemia   . Asthma   . Atrial fibrillation [I48.91] 10/01/2014  . Bilateral leg cramps   . Chronic anticoagulation   . Chronic constipation   . Complication of anesthesia   . Diabetes mellitus   . Dizziness 05/23/2011  . DJD (degenerative joint disease)   . DOE (dyspnea on exertion) 03/03/2013   Onset early 2020  - 02/05/2019   Walked RA  2 laps @  approx 269ft each @ nl pace  stopped due to  Light headed, no sp or sob, no desats    . DVT (deep venous thrombosis) ([place]) 07/30/2013  . Dyspnea    occasionally  . GERD (gastroesophageal reflux disease)   . Heart murmur    as a child  . HTN (hypertension)   . Hypercholesteremia   . Hypertension 05/23/2011  . Hyponatremia 02/06/2019   Na 126 on no obvious meds to cause it with last na 133 in 2014   . Long term (current) use of anticoagulants 03/03/2013  . Low back pain 07/06/2018  . Low back strain 08/09/2018  . Obesity   . Osteoarthritis of right knee 07/06/2018  . Pain in right knee 07/06/2018  . Pneumonia   . PONV (postoperative nausea and vomiting)    needs pre-medication  . Protein S deficiency ([place])    prior DVT L knee, L arm DVT,  followed by Dr West Pugh at [facility]  . PVC's (premature ventricular contractions)   . S/P cardiac catheterization 02/2013   Normal coronaries; low normal EF at 50%  . Sleep apnea    can't use the cpap (diagnosed 2017)  . Solitary pulmonary nodule on lung CT 02/06/2019   First noted 01/13/2014 > no change as of 12/21/2018  . Spondylolisthesis, lumbar region 08/09/2018  . Stroke [facility])    TIA in 2018 or 2019  . TIA (transient ischemic attack)    Discharge Diagnoses:   Active Problems:   Bimalleolar ankle fracture, left, closed, initial encounter  Estimated body mass  index is 31.6 kg/m as calculated from the following:   Height as of this encounter: 5' 4.5" (1.638 m).   Weight as of this encounter: 84.8 kg.  Procedure:  Procedure(s) (LRB): OPEN REDUCTION INTERNAL FIXATION (ORIF) ANKLE FRACTURE (Left)   Consults: None  HPI: Ms. Pontiff was admitted to the hospital for management of her left ankle fracture.  She sustained an unstable fracture following a ground-level fall.  She was admitted for operative management and postoperative care. Laboratory Data: Admission on 02/26/2020  Component Date Value Ref Range Status  . Glucose-Capillary 02/26/2020 155* 70 - 99 mg/dL Final   Glucose reference range applies only to samples taken after fasting for at least 8 hours.  . Prothrombin Time 02/26/2020 14.5  11.4 - 15.2 seconds Final  . INR 02/26/2020 1.2  0.8 - 1.2 Final   Comment: (NOTE) INR goal varies based on device and disease states. Performed at [facility], [place] [address] [place], [place] [address]   . WBC 02/26/2020 8.2  4.0 - 10.5 K/uL Final  . RBC 02/26/2020 3.64* 3.87 - 5.11 MIL/uL Final  . Hemoglobin 02/26/2020 11.3* 12.0 - 15.0 g/dL Final  . HCT 02/26/2020 34.9* 36 - 46 % Final  . MCV 02/26/2020 95.9  80.0 -  100.0 fL Final  . MCH 02/26/2020 31.0  26.0 - 34.0 pg Final  . MCHC 02/26/2020 32.4  30.0 - 36.0 g/dL Final  . RDW 02/26/2020 13.2  11.5 - 15.5 % Final  . Platelets 02/26/2020 321  150 - 400 K/uL Final  . nRBC 02/26/2020 0.0  0.0 - 0.2 % Final   Performed at [facility], [place] [address] [place], [place] [address]  . Sodium 02/26/2020 130* 135 - 145 mmol/L Final  . Potassium 02/26/2020 4.3  3.5 - 5.1 mmol/L Final  . Chloride 02/26/2020 92* 98 - 111 mmol/L Final  . CO2 02/26/2020 26  22 - 32 mmol/L Final  . Glucose, Bld 02/26/2020 157* 70 - 99 mg/dL Final   Glucose reference range applies only to samples taken after fasting for at least 8 hours.  . BUN 02/26/2020 15  8 - 23 mg/dL Final  . Creatinine, Ser 02/26/2020  1.10* 0.44 - 1.00 mg/dL Final  . Calcium 02/26/2020 8.8* 8.9 - 10.3 mg/dL Final  . GFR calc non Af Amer 02/26/2020 49* >60 mL/min Final  . GFR calc Af Amer 02/26/2020 57* >60 mL/min Final  . Anion gap 02/26/2020 12  5 - 15 Final   Performed at [facility], [place] [address], [place], [place] [address]  . MRSA, PCR 02/26/2020 NEGATIVE  NEGATIVE Final  . Staphylococcus aureus 02/26/2020 NEGATIVE  NEGATIVE Final   Comment: (NOTE) The Xpert SA Assay (FDA approved for NASAL specimens in patients 75 years of age and older), is one component of a comprehensive surveillance program. It is not intended to diagnose infection nor to guide or monitor treatment. Performed at [facility], [place] [address] [place], [place] [address]   . Glucose-Capillary 02/26/2020 129* 70 - 99 mg/dL Final   Glucose reference range applies only to samples taken after fasting for at least 8 hours.  . Hgb A1c MFr Bld 02/26/2020 7.4* 4.8 - 5.6 % Final   Comment: (NOTE) Pre diabetes:          5.7%-6.4%  Diabetes:              >6.4%  Glycemic control for   <7.0% adults with diabetes   . Mean Plasma Glucose 02/26/2020 165.68  mg/dL Final   Performed at [place] [address] [place], [place] [address]  . Glucose-Capillary 02/26/2020 199* 70 - 99 mg/dL Final   Glucose reference range applies only to samples taken after fasting for at least 8 hours.  . Comment 1 02/26/2020 Notify RN   Final  . Comment 2 02/26/2020 Document in Chart   Final  . Glucose-Capillary 02/27/2020 275* 70 - 99 mg/dL Final   Glucose reference range applies only to samples taken after fasting for at least 8 hours.  . Comment 1 02/27/2020 Notify RN   Final  . Comment 2 02/27/2020 Document in Chart   Final  . Glucose-Capillary 02/27/2020 252* 70 - 99 mg/dL Final   Glucose reference range applies only to samples taken after fasting for at least 8 hours.  . Glucose-Capillary 02/27/2020 154* 70 - 99 mg/dL Final   Glucose  reference range applies only to samples taken after fasting for at least 8 hours.  . Prothrombin Time 02/28/2020 16.8* 11.4 - 15.2 seconds Final  . INR 02/28/2020 1.4* 0.8 - 1.2 Final   Comment: (NOTE) INR goal varies based on device and disease states. Performed at [facility], [place]  [address] [place], [place] [address]   . Glucose-Capillary 02/27/2020 207* 70 - 99 mg/dL Final   Glucose reference range applies only to samples taken after fasting for at least 8 hours.  . Comment 1 02/27/2020 Notify RN   Final  . Comment 2 02/27/2020 Document in Chart   Final  . Glucose-Capillary 02/28/2020 166* 70 - 99 mg/dL Final   Glucose reference range applies only to samples taken after fasting for at least 8 hours.  . Comment 1 02/28/2020 Notify RN   Final  . Comment 2 02/28/2020 Document in Chart   Final  . Glucose-Capillary 02/28/2020 147* 70 - 99 mg/dL Final   Glucose reference range applies only to samples taken after fasting for at least 8 hours.  Hospital Outpatient Visit on 02/25/2020  Component Date Value Ref Range Status  . SARS Coronavirus 2 02/25/2020 NEGATIVE  NEGATIVE Final   Comment: (NOTE) SARS-CoV-2 target nucleic acids are NOT DETECTED.  The SARS-CoV-2 RNA is generally detectable in upper and lower respiratory specimens during the acute phase of infection. Negative results do not preclude SARS-CoV-2 infection, do not rule out co-infections with other pathogens, and should not be used as the sole basis for treatment or other patient management decisions. Negative results must be combined with clinical observations, patient history, and epidemiological information. The expected result is Negative.  Fact Sheet for Patients: SugarRoll.be  Fact Sheet for Healthcare Providers: https://www.woods-mathews.com/  This test is not yet approved or cleared by the [place] FDA and  has been authorized for detection and/or diagnosis of  SARS-CoV-2 by FDA under an Emergency Use Authorization (EUA). This EUA will remain  in effect (meaning this test can be used) for the duration of the COVID-19 declaration under Se                          ction 564(b)(1) of the Act, 21 U.S.C. section 360bbb-3(b)(1), unless the authorization is terminated or revoked sooner.  Performed at [facility], [place] [address], [place], [place] [address]      X-Rays:DG Ankle Complete Left  Result Date: 02/26/2020 CLINICAL DATA:  ORIF of ankle fracture. EXAM: DG C-ARM 1-60 MIN; LEFT ANKLE COMPLETE - 3+ VIEW FLUOROSCOPY TIME:  Fluoroscopy Time:  20 seconds Radiation Exposure Index (if provided by the fluoroscopic device): 0.3 mGy Number of Acquired Spot Images: 0 COMPARISON:  02/18/20 FINDINGS: Two images from portable C-arm radiography show interval plate and screw fixation of the distal fibula. Additionally, there is been screw fixation of the medial malleolus. The hardware components and fracture fragments are in anatomic alignment. IMPRESSION: Status post ORIF of distal tibial and fibular fractures. Electronically Signed   By: Kerby Moors M.D.   On: 02/26/2020 18:54   DG C-Arm 1-60 Min  Result Date: 02/26/2020 CLINICAL DATA:  ORIF of ankle fracture. EXAM: DG C-ARM 1-60 MIN; LEFT ANKLE COMPLETE - 3+ VIEW FLUOROSCOPY TIME:  Fluoroscopy Time:  20 seconds Radiation Exposure Index (if provided by the fluoroscopic device): 0.3 mGy Number of Acquired Spot Images: 0 COMPARISON:  02/18/20 FINDINGS: Two images from portable C-arm radiography show interval plate and screw fixation of the distal fibula. Additionally, there is been screw fixation of the medial malleolus. The hardware components and fracture fragments are in anatomic alignment. IMPRESSION: Status post ORIF of distal tibial and fibular fractures. Electronically Signed   By: Kerby Moors M.D.   On: 02/26/2020 18:54    EKG: Orders placed or performed  during the hospital encounter of 02/26/20   . EKG 12-Lead  . EKG 12-Lead     Hospital Course: Pamela [name] is a 74 y.o. who was admitted to Hospital. They were brought to the operating room on 02/26/2020 and underwent Procedure(s): OPEN REDUCTION INTERNAL FIXATION (ORIF) ANKLE FRACTURE.  Patient tolerated the procedure well and was later transferred to the recovery room and then to the orthopaedic floor for postoperative care.  They were given PO and IV analgesics for pain control following their surgery.  They were given 24 hours of postoperative antibiotics of  Anti-infectives (From admission, onward)   Start     Dose/Rate Route Frequency Ordered Stop   02/26/20 1300  ceFAZolin (ANCEF) IVPB 2g/100 mL premix        2 g 200 mL/hr over 30 Minutes Intravenous On call to O.R. 02/26/20 [id number] 02/26/20 1520     and started on DVT prophylaxis in the form of Lovenox and Coumadin.  Physical therapy was consulted for postoperative care.  She did progress well with PT.  She was minimal assist.  She was indicated for a bedside commode for DME.  She did have some chest wall pain after being on her walker.  We did monitor this closely night after surgery.  This resolved with anti-inflammatories and muscle relaxer.  Furthermore, she had a normal twelve-lead EKG that was reassuring.  On postoperative day #2 she had her peripheral nerve block resolved.  Her pain was well controlled with oral medications and she was indicated to discharge home.  She was scheduled for home health physical therapy.  Diet: Regular diet Activity:NWB Follow-up:in 2 weeks Disposition - Home Discharged Condition: good   Discharge Instructions    Call MD / Call 911   Complete by: As directed    If you experience chest pain or shortness of breath, CALL 911 and be transported to the hospital emergency room.  If you develope a fever above 101 F, pus (white drainage) or increased drainage or redness at the wound, or calf pain, call your surgeon's office.    Constipation Prevention   Complete by: As directed    Drink plenty of fluids.  Prune juice may be helpful.  You may use a stool softener, such as Colace (over the counter) 100 mg twice a day.  Use MiraLax (over the counter) for constipation as needed.   DME Bedside commode   Complete by: As directed    Patient needs a bedside commode to treat with the following condition: Bimalleolar ankle fracture, left, closed, initial encounter   Diet - low sodium heart healthy   Complete by: As directed    Face-to-face encounter (required for Medicare/Medicaid patients)   Complete by: As directed    I Nicholes Stairs certify that this patient is under my care and that I, or a nurse practitioner or physician's assistant working with me, had a face-to-face encounter that meets the physician face-to-face encounter requirements with this patient on 02/27/2020. The encounter with the patient was in whole, or in part for the following medical condition(s) which is the primary reason for home health care (List medical condition): left ankle, bimalleolar fracture, s/p ORIF   The encounter with the patient was in whole, or in part, for the following medical condition, which is the primary reason for home health care: s/p left ankle ORIF   I certify that, based on my findings, the following services are medically necessary home health services: Physical therapy  Reason for Medically Necessary Home Health Services: Therapy- Instruction on Safe use of Assistive Devices for ADLs   My clinical findings support the need for the above services: Unable to leave home safely without assistance and/or assistive device   Further, I certify that my clinical findings support that this patient is homebound due to: Unable to leave home safely without assistance   Home Health   Complete by: As directed    To provide the following care/treatments: PT   Increase activity slowly as tolerated   Complete by: As directed       Allergies as of 02/28/2020      Reactions   Ketek [telithromycin] Nausea And Vomiting, Rash   Loxapine Succinate Hives   Naproxen Sodium Anaphylaxis, Hives   Amoxapine And Related Hives   Darvon Nausea And Vomiting   Amoxicillin Rash   Propoxyphene Nausea And Vomiting      Medication List    TAKE these medications   acetaminophen 500 MG tablet Commonly known as: TYLENOL Take 500-1,000 mg by mouth every 6 (six) hours as needed (for pain.).   albuterol 108 (90 Base) MCG/ACT inhaler Commonly known as: VENTOLIN HFA Inhale 1-2 puffs into the lungs every 6 (six) hours as needed for wheezing or shortness of breath.   ascorbic acid 500 MG tablet Commonly known as: VITAMIN C Take 500 mg by mouth daily with lunch.   aspirin EC 81 MG tablet Take 81 mg by mouth daily.   atorvastatin 40 MG tablet Commonly known as: LIPITOR Take 40 mg by mouth daily at 12 noon.   azelastine 0.1 % nasal spray Commonly known as: ASTELIN Place 2 sprays into the nose daily as needed for rhinitis or allergies.   B-complex with vitamin C tablet Take 1 tablet by mouth daily with lunch.   carvedilol 12.5 MG tablet Commonly known as: COREG Take 1 tablet (12.5 mg total) by mouth 2 (two) times daily.   cefdinir 300 MG capsule Commonly known as: OMNICEF Take 300 mg by mouth 2 (two) times daily.   denosumab 60 MG/ML Sosy injection Commonly known as: PROLIA Inject 60 mg into the skin every 6 (six) months.   doxycycline 100 MG capsule Commonly known as: VIBRAMYCIN Take 100 mg by mouth 2 (two) times daily.   ferrous sulfate 325 (65 FE) MG tablet Take 325 mg by mouth daily.   furosemide 20 MG tablet Commonly known as: LASIX Take 20 mg by mouth daily.   ipratropium-albuterol 0.5-2.5 (3) MG/3ML Soln Commonly known as: DUONEB Take 3 mLs by nebulization 4 (four) times daily as needed (wheezing/shortness of breath).   lisinopril 2.5 MG tablet Commonly known as: ZESTRIL Take 2.5 mg by mouth  daily.   Magnesium Oxide 400 (240 Mg) MG Tabs Take 800 mg by mouth 2 (two) times daily.   ondansetron 8 MG disintegrating tablet Commonly known as: ZOFRAN-ODT Take 8 mg by mouth every 8 (eight) hours as needed for nausea or vomiting. What changed: Another medication with the same name was added. Make sure you understand how and when to take each.   ondansetron 4 MG disintegrating tablet Commonly known as: Zofran ODT Take 1 tablet (4 mg total) by mouth every 8 (eight) hours as needed. What changed: You were already taking a medication with the same name, and this prescription was added. Make sure you understand how and when to take each.   oxyCODONE 5 MG immediate release tablet Commonly known as: Oxy IR/ROXICODONE Take 1 tablet (5 mg total)  by mouth every 6 (six) hours as needed for severe pain. What changed:   when to take this  reasons to take this   pantoprazole 40 MG tablet Commonly known as: Protonix Take 1 tablet (40 mg total) by mouth daily. Take 30-60 min before first meal of the day What changed: when to take this   pioglitazone 30 MG tablet Commonly known as: ACTOS Take 30 mg by mouth daily.   polyethylene glycol 17 g packet Commonly known as: MIRALAX / GLYCOLAX Take 17 g by mouth daily.   Salonpas Pain Relief Patch Ptch Apply 1 patch topically daily as needed (pain.).   Senokot S 8.6-50 MG tablet Generic drug: senna-docusate Take 1 tablet by mouth at bedtime.   sodium chloride 0.65 % Soln nasal spray Commonly known as: OCEAN Place 1-2 sprays into both nostrils 4 (four) times daily as needed for congestion.   tolterodine 2 MG 24 hr capsule Commonly known as: DETROL LA Take 2 mg by mouth at bedtime.   Toujeo SoloStar 300 UNIT/ML Solostar Pen Generic drug: insulin glargine (1 Unit Dial) Inject 17 Units into the skin daily.   traMADol 50 MG tablet Commonly known as: Ultram Take 1 tablet (50 mg total) by mouth every 6 (six) hours as needed for moderate  pain.   Trulicity 1.5 ZO/1.0RU Sopn Generic drug: Dulaglutide Inject 1.5 mg into the skin every Thursday.   Vitamin D3 50 MCG (2000 UT) Tabs Take 2,000 Units by mouth daily with lunch.   warfarin 5 MG tablet Commonly known as: COUMADIN Take as directed. If you are unsure how to take this medication, talk to your nurse or doctor. Original instructions: Take 5 mg by mouth daily.   warfarin 1 MG tablet Commonly known as: COUMADIN Take as directed. If you are unsure how to take this medication, talk to your nurse or doctor. Original instructions: Take 0.5 mg by mouth See admin instructions. Take 0.5 tablet (0.5 mg) with a 5 mg tablet on Sundays, Tuesdays, Thursdays, Saturdays.            Durable Medical Equipment  (From admission, onward)         Start     Ordered   02/27/20 0000  DME Bedside commode       Question:  Patient needs a bedside commode to treat with the following condition  Answer:  Bimalleolar ankle fracture, left, closed, initial encounter   02/27/20 1158          Follow-up Information    Nicholes Stairs, MD In 2 weeks.   Specialty: Orthopedic Surgery Why: For suture removal, For wound re-check Contact information: [address] [place] [address] [phone]        [facility] Follow up.   Specialty: Home Health Services Why: Your home health has been set up. The agency listed above will contact you. If you have any questions or concerns please call number listed above. Contact information: [address] [place] [address] [phone]        Nicholes Stairs, MD In 2 weeks.   Specialty: Orthopedic Surgery Why: For suture removal Contact information: [address] [place] [address] [phone]               Signed: Geralynn Rile, MD Orthopaedic Surgery 02/28/2020, 3:09 PM

## 2020-02-28 NOTE — Plan of Care (Signed)
Patient alert and oriented, mae's well, voiding adequate amount of urine, swallowing without difficulty, no c/o pain at time of discharge. Patient discharged home with family. Script and discharged instructions given to patient. Patient and family stated understanding of instructions given. Patient has an appointment with Dr. Stann Mainland

## 2020-02-28 NOTE — TOC Progression Note (Signed)
Transition of Care [facility]) - Progression Note    Patient Details  Name: Telissa Palmisano MRN: [id number] Date of Birth: 1946-02-28  Transition of Care [facility]) CM/SW [place], RN Phone Number: [phone]  02/28/2020, 3:20 PM  Clinical Narrative:  , H&P, facesheet, op note, and d/c summary faxed to [facility] for home health referral.    Expected Discharge Plan: [place] Barriers to Discharge: Continued Medical Work up  Expected Discharge Plan and Services Expected Discharge Plan: [place] In-house Referral: NA Discharge Planning Services: CM Consult Post Acute Care Choice: [place] arrangements for the past 2 months: Single Family Home Expected Discharge Date: 02/28/20               DME Arranged: N/A DME Agency: NA       HH Arranged: PT (pending) HH Agency: Other - See comment ([place]  notified services pending) Date HH Agency Contacted: 02/27/20 Time [place]: 4712 Representative spoke with at [place]: [place] ([place]) Interventions    Readmission Risk Interventions No flowsheet data found.

## 2020-02-28 NOTE — Progress Notes (Signed)
Orthopedic Tech Progress Note Patient Details:  Pamela [name] August 01, 1946 [id number] Was called by MD to trim a little off patient's cast on leg. Was a little to high. Patient ID: Pamela [name], female   DOB: [birth year], 74 y.o.   MRN: [id number]   Janit Pagan 02/28/2020, 3:27 PM

## 2020-02-29 DIAGNOSIS — M1711 Unilateral primary osteoarthritis, right knee: Secondary | ICD-10-CM | POA: Diagnosis not present

## 2020-02-29 DIAGNOSIS — I10 Essential (primary) hypertension: Secondary | ICD-10-CM | POA: Diagnosis not present

## 2020-02-29 DIAGNOSIS — K219 Gastro-esophageal reflux disease without esophagitis: Secondary | ICD-10-CM | POA: Diagnosis not present

## 2020-02-29 DIAGNOSIS — D649 Anemia, unspecified: Secondary | ICD-10-CM | POA: Diagnosis not present

## 2020-02-29 DIAGNOSIS — I4891 Unspecified atrial fibrillation: Secondary | ICD-10-CM | POA: Diagnosis not present

## 2020-02-29 DIAGNOSIS — E669 Obesity, unspecified: Secondary | ICD-10-CM | POA: Diagnosis not present

## 2020-02-29 DIAGNOSIS — E119 Type 2 diabetes mellitus without complications: Secondary | ICD-10-CM | POA: Diagnosis not present

## 2020-02-29 DIAGNOSIS — S82842D Displaced bimalleolar fracture of left lower leg, subsequent encounter for closed fracture with routine healing: Secondary | ICD-10-CM | POA: Diagnosis not present

## 2020-02-29 DIAGNOSIS — E7800 Pure hypercholesterolemia, unspecified: Secondary | ICD-10-CM | POA: Diagnosis not present

## 2020-03-02 ENCOUNTER — Telehealth: Payer: Self-pay | Admitting: Cardiology

## 2020-03-02 NOTE — Telephone Encounter (Signed)
New Message   Patient is calling because she needs someone to come with her to her appt due to her having foot surgery

## 2020-03-02 NOTE — Telephone Encounter (Signed)
Spoke to patient just now and let her know that she could have one person come with her to her appointment since she is in a wheelchair at this time and is requiring assistance with this. She verbalizes understanding and thanks me for the call back.    Encouraged patient to call back with any questions or concerns.

## 2020-03-04 DIAGNOSIS — I10 Essential (primary) hypertension: Secondary | ICD-10-CM | POA: Diagnosis not present

## 2020-03-04 DIAGNOSIS — I4891 Unspecified atrial fibrillation: Secondary | ICD-10-CM | POA: Diagnosis not present

## 2020-03-04 DIAGNOSIS — M1711 Unilateral primary osteoarthritis, right knee: Secondary | ICD-10-CM | POA: Diagnosis not present

## 2020-03-04 DIAGNOSIS — K219 Gastro-esophageal reflux disease without esophagitis: Secondary | ICD-10-CM | POA: Diagnosis not present

## 2020-03-04 DIAGNOSIS — M810 Age-related osteoporosis without current pathological fracture: Secondary | ICD-10-CM | POA: Diagnosis not present

## 2020-03-04 DIAGNOSIS — S82899A Other fracture of unspecified lower leg, initial encounter for closed fracture: Secondary | ICD-10-CM | POA: Diagnosis not present

## 2020-03-04 DIAGNOSIS — Z7901 Long term (current) use of anticoagulants: Secondary | ICD-10-CM | POA: Diagnosis not present

## 2020-03-04 DIAGNOSIS — S82842D Displaced bimalleolar fracture of left lower leg, subsequent encounter for closed fracture with routine healing: Secondary | ICD-10-CM | POA: Diagnosis not present

## 2020-03-04 DIAGNOSIS — D649 Anemia, unspecified: Secondary | ICD-10-CM | POA: Diagnosis not present

## 2020-03-04 DIAGNOSIS — D6859 Other primary thrombophilia: Secondary | ICD-10-CM | POA: Diagnosis not present

## 2020-03-04 DIAGNOSIS — E119 Type 2 diabetes mellitus without complications: Secondary | ICD-10-CM | POA: Diagnosis not present

## 2020-03-04 DIAGNOSIS — E7800 Pure hypercholesterolemia, unspecified: Secondary | ICD-10-CM | POA: Diagnosis not present

## 2020-03-04 DIAGNOSIS — E669 Obesity, unspecified: Secondary | ICD-10-CM | POA: Diagnosis not present

## 2020-03-05 ENCOUNTER — Other Ambulatory Visit: Payer: Self-pay

## 2020-03-05 ENCOUNTER — Ambulatory Visit (INDEPENDENT_AMBULATORY_CARE_PROVIDER_SITE_OTHER): Payer: Medicare PPO | Admitting: Cardiology

## 2020-03-05 ENCOUNTER — Encounter: Payer: Self-pay | Admitting: Cardiology

## 2020-03-05 VITALS — BP 120/46 | HR 62 | Ht 64.5 in

## 2020-03-05 DIAGNOSIS — I10 Essential (primary) hypertension: Secondary | ICD-10-CM

## 2020-03-05 DIAGNOSIS — R42 Dizziness and giddiness: Secondary | ICD-10-CM | POA: Diagnosis not present

## 2020-03-05 DIAGNOSIS — I493 Ventricular premature depolarization: Secondary | ICD-10-CM

## 2020-03-05 DIAGNOSIS — I480 Paroxysmal atrial fibrillation: Secondary | ICD-10-CM | POA: Diagnosis not present

## 2020-03-05 DIAGNOSIS — D6859 Other primary thrombophilia: Secondary | ICD-10-CM

## 2020-03-05 DIAGNOSIS — G459 Transient cerebral ischemic attack, unspecified: Secondary | ICD-10-CM | POA: Diagnosis not present

## 2020-03-05 NOTE — Patient Instructions (Signed)
Medication Instructions:  Your physician recommends that you continue on your current medications as directed. Please refer to the Current Medication list given to you today.  *If you need a refill on your cardiac medications before your next appointment, please call your pharmacy*   Lab Work: None ordered   If you have labs (blood work) drawn today and your tests are completely normal, you will receive your results only by: . MyChart Message (if you have MyChart) OR . A paper copy in the mail If you have any lab test that is abnormal or we need to change your treatment, we will call you to review the results.   Testing/Procedures: None ordered    Follow-Up: At [facility], you and your health needs are our priority.  As part of our continuing mission to provide you with exceptional heart care, we have created designated Provider Care Teams.  These Care Teams include your primary Cardiologist (physician) and Advanced Practice Providers (APPs -  Physician Assistants and Nurse Practitioners) who all work together to provide you with the care you need, when you need it.  We recommend signing up for the patient portal called "MyChart".  Sign up information is provided on this After Visit Summary.  MyChart is used to connect with patients for Virtual Visits (Telemedicine).  Patients are able to view lab/test results, encounter notes, upcoming appointments, etc.  Non-urgent messages can be sent to your provider as well.   To learn more about what you can do with MyChart, go to [email or web address].    Your next appointment:   3 month(s)  The format for your next appointment:   In Person  Provider:   Kardie Tobb, DO   Other Instructions None   

## 2020-03-05 NOTE — Progress Notes (Signed)
Cardiology Office Note:    Date:  03/05/2020   ID:  Pamela [name], DOB [birth year], MRN [id number]  PCP:  Serita Grammes, MD  Cardiologist:  Berniece Salines, DO  Electrophysiologist:  None   Referring MD: Serita Grammes, MD   " I had my surgery and I am recovering well"  History of Present Illness:    Pamela [name] is a 74 y.o. 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   At her last visit, I stop her Amlodipine due to significant dizziness. She tells me that she is not having any of these episodes but since the surgery she has not been on her feet to notice the difference.    Past Medical History:  Diagnosis Date  . Anemia   . Asthma   . Atrial fibrillation [I48.91] 10/01/2014  . Bilateral leg cramps   . Chronic anticoagulation   . Chronic constipation   . Complication of anesthesia   . Diabetes mellitus   . Dizziness 05/23/2011  . DJD (degenerative joint disease)   . DOE (dyspnea on exertion) 03/03/2013   Onset early 2020  - 02/05/2019   Walked RA  2 laps @  approx 251ft each @ nl pace  stopped due to  Light headed, no sp or sob, no desats    . DVT (deep venous thrombosis) ([place]) 07/30/2013  . Dyspnea    occasionally  . GERD (gastroesophageal reflux disease)   . Heart murmur    as a child  . HTN (hypertension)   . Hypercholesteremia   . Hypertension 05/23/2011  . Hyponatremia 02/06/2019   Na 126 on no obvious meds to cause it with last na 133 in 2014   . Long term (current) use of anticoagulants 03/03/2013  . Low back pain 07/06/2018  . Low back strain 08/09/2018  . Obesity   . Osteoarthritis of right knee 07/06/2018  . Pain in right knee 07/06/2018  . Pneumonia   . PONV (postoperative nausea and vomiting)    needs pre-medication  .  Protein S deficiency ([place])    prior DVT L knee, L arm DVT,  followed by Dr West Pugh at [facility]  . PVC's (premature ventricular contractions)   . S/P cardiac catheterization 02/2013   Normal coronaries; low normal EF at 50%  . Sleep apnea    can't use the cpap (diagnosed 2017)  . Solitary pulmonary nodule on lung CT 02/06/2019   First noted 01/13/2014 > no change as of 12/21/2018  . Spondylolisthesis, lumbar region 08/09/2018  . Stroke [facility])    TIA in 2018 or 2019  . TIA (transient ischemic attack)     Past Surgical History:  Procedure Laterality Date  . CARPAL TUNNEL RELEASE  1994   bilateral  . CHOLECYSTECTOMY  1993  . COLONOSCOPY    . FOOT TENDON SURGERY Right   . left knee replacement  2006  . ORIF ANKLE FRACTURE Left 02/26/2020   Procedure: OPEN REDUCTION INTERNAL FIXATION (ORIF) ANKLE FRACTURE;  Surgeon: Nicholes Stairs, MD;  Location: [place];  Service: Orthopedics;  Laterality: Left;  90 mins  . thumb surgery Left 2018  . TONSILLECTOMY    . TOTAL ABDOMINAL HYSTERECTOMY  1988    Current Medications: Current Meds  Medication Sig  . acetaminophen (TYLENOL) 500 MG tablet Take 500-1,000 mg by mouth every  6 (six) hours as needed (for pain.).  Marland Kitchen albuterol (VENTOLIN HFA) 108 (90 Base) MCG/ACT inhaler Inhale 1-2 puffs into the lungs every 6 (six) hours as needed for wheezing or shortness of breath.   Marland Kitchen aspirin EC 81 MG tablet Take 81 mg by mouth daily.  Marland Kitchen atorvastatin (LIPITOR) 40 MG tablet Take 40 mg by mouth daily at 12 noon.   Marland Kitchen azelastine (ASTELIN) 137 MCG/SPRAY nasal spray Place 2 sprays into the nose daily as needed for rhinitis or allergies.   . B Complex-C (B-COMPLEX WITH VITAMIN C) tablet Take 1 tablet by mouth daily with lunch.  . carvedilol (COREG) 12.5 MG tablet Take 1 tablet (12.5 mg total) by mouth 2 (two) times daily.  . Cholecalciferol (VITAMIN D3) 50 MCG (2000 UT) TABS Take 2,000 Units by mouth daily with lunch.   . denosumab (PROLIA) 60 MG/ML SOSY injection Inject  60 mg into the skin every 6 (six) months.  . Dulaglutide (TRULICITY) 1.5 PO/2.4MP SOPN Inject 1.5 mg into the skin every Thursday.   . ferrous sulfate 325 (65 FE) MG tablet Take 325 mg by mouth daily.  . furosemide (LASIX) 20 MG tablet Take 20 mg by mouth daily.  . Insulin Glargine, 1 Unit Dial, (TOUJEO SOLOSTAR) 300 UNIT/ML SOPN Inject 17 Units into the skin daily.   Marland Kitchen ipratropium-albuterol (DUONEB) 0.5-2.5 (3) MG/3ML SOLN Take 3 mLs by nebulization 4 (four) times daily as needed (wheezing/shortness of breath).   Marland Kitchen lisinopril (ZESTRIL) 2.5 MG tablet Take 2.5 mg by mouth daily.   . Magnesium Oxide 400 (240 Mg) MG TABS Take 800 mg by mouth 2 (two) times daily.  . Menthol-Methyl Salicylate (SALONPAS PAIN RELIEF PATCH) PTCH Apply 1 patch topically daily as needed (pain.).  Marland Kitchen ondansetron (ZOFRAN ODT) 4 MG disintegrating tablet Take 1 tablet (4 mg total) by mouth every 8 (eight) hours as needed.  . ondansetron (ZOFRAN-ODT) 8 MG disintegrating tablet Take 8 mg by mouth every 8 (eight) hours as needed for nausea or vomiting.   Marland Kitchen oxyCODONE (OXY IR/ROXICODONE) 5 MG immediate release tablet Take 1 tablet (5 mg total) by mouth every 6 (six) hours as needed for severe pain.  . pantoprazole (PROTONIX) 40 MG tablet Take 1 tablet (40 mg total) by mouth daily. Take 30-60 min before first meal of the day (Patient taking differently: Take 40 mg by mouth daily before breakfast. Take 30-60 min before first meal of the day)  . pioglitazone (ACTOS) 30 MG tablet Take 30 mg by mouth daily.   . polyethylene glycol (MIRALAX / GLYCOLAX) 17 g packet Take 17 g by mouth daily.  Marland Kitchen senna-docusate (SENOKOT S) 8.6-50 MG tablet Take 1 tablet by mouth at bedtime.  . sodium chloride (OCEAN) 0.65 % SOLN nasal spray Place 1-2 sprays into both nostrils 4 (four) times daily as needed for congestion.  Marland Kitchen tolterodine (DETROL LA) 2 MG 24 hr capsule Take 2 mg by mouth at bedtime.   . traMADol (ULTRAM) 50 MG tablet Take 1 tablet (50 mg total)  by mouth every 6 (six) hours as needed for moderate pain.  Marland Kitchen warfarin (COUMADIN) 1 MG tablet Take 0.5 mg by mouth See admin instructions. Take 0.5 tablet (0.5 mg) with a 5 mg tablet on Sundays, Tuesdays, Thursdays, Saturdays.  Marland Kitchen warfarin (COUMADIN) 5 MG tablet Take 5 mg by mouth daily.     Allergies:   Ketek [telithromycin], Loxapine succinate, Naproxen sodium, Amoxapine and related, Darvon, Amoxicillin, and Propoxyphene   Social History   Socioeconomic History  .  Marital status: Married    Spouse name: [name]  . Number of children: Not on file  . Years of education: Not on file  . Highest education level: Not on file  Occupational History  . Not on file  Tobacco Use  . Smoking status: Never Smoker  . Smokeless tobacco: Never Used  Vaping Use  . Vaping Use: Never used  Substance and Sexual Activity  . Alcohol use: No  . Drug use: No  . Sexual activity: Not on file  Other Topics Concern  . Not on file  Social History Narrative   Lives with spouse in [place].  2 grown daughters.   Retired Glass blower/designer     Social Determinants of Radio broadcast assistant Strain:   . Difficulty of Paying Living Expenses:   Food Insecurity:   . Worried About Charity fundraiser in the Last Year:   . Arboriculturist in the Last Year:   Transportation Needs:   . Film/video editor (Medical):   Marland Kitchen Lack of Transportation (Non-Medical):   Physical Activity:   . Days of Exercise per Week:   . Minutes of Exercise per Session:   Stress:   . Feeling of Stress :   Social Connections:   . Frequency of Communication with Friends and Family:   . Frequency of Social Gatherings with Friends and Family:   . Attends Religious Services:   . Active Member of Clubs or Organizations:   . Attends Archivist Meetings:   Marland Kitchen Marital Status:      Family History: The patient's family history includes Antithrombin III deficiency in her mother; Cirrhosis in her mother; Coronary artery disease  in her father; Heart attack in her brother, father, and sister; Hypertension in her brother, father, and sister; Protein S deficiency in her daughter and sister; Ulcerative colitis in her mother. There is no history of Stroke, Colitis, Colon polyps, Esophageal cancer, Liver cancer, Pancreatic cancer, Rectal cancer, or Stomach cancer.  ROS:   Review of Systems  Constitution: Negative for decreased appetite, fever and weight gain.  HENT: Negative for congestion, ear discharge, hoarse voice and sore throat.   Eyes: Negative for discharge, redness, vision loss in right eye and visual halos.  Cardiovascular: Negative for chest pain, dyspnea on exertion, leg swelling, orthopnea and palpitations.  Respiratory: Negative for cough, hemoptysis, shortness of breath and snoring.   Endocrine: Negative for heat intolerance and polyphagia.  Hematologic/Lymphatic: Negative for bleeding problem. Does not bruise/bleed easily.  Skin: Negative for flushing, nail changes, rash and suspicious lesions.  Musculoskeletal: Negative for arthritis, joint pain, muscle cramps, myalgias, neck pain and stiffness.  Gastrointestinal: Negative for abdominal pain, bowel incontinence, diarrhea and excessive appetite.  Genitourinary: Negative for decreased libido, genital sores and incomplete emptying.  Neurological: Negative for brief paralysis, focal weakness, headaches and loss of balance.  Psychiatric/Behavioral: Negative for altered mental status, depression and suicidal ideas.  Allergic/Immunologic: Negative for HIV exposure and persistent infections.    EKGs/Labs/Other Studies Reviewed:    The following studies were reviewed today:   EKG:  The ekg ordered today demonstrates   ZIo monitor  The patient wore the monitor for 6 days 20 hours starting 11/14/2019. Indication: Palpitations  The minimum heart rate was 58 bpm, maximum heart rate was 203 bpm, and average heart rate was 69 bpm. Predominant underlying rhythm  was Sinus Rhythm.  5 Supraventricular Tachycardia runs occurred, the run with the fastest interval lasting 6 beats with a  maximum rate of 203 bpm, the longest lasting 10.6 secs with an average rate of 148 bpm.   Premature atrial complexes were rare less than 1.0%. Premature Ventricular complexes were occasional (1.8%, 12531).  Ventricular Bigeminy and Trigeminy were present.  No ventricular tachycardia, no pauses, No AV block and no atrial fibrillation present. No patient triggered events or diary event noted.  Conclusion: This study is remarkable for the following:                             1.  Asymptomatic paroxysmal supraventricular tachycardia which is likely atrial tachycardia with variable block.                             2.  Asymptomatic occasional premature ventricular complex.   Recent Labs: 11/14/2019: TSH 2.020 02/26/2020: BUN 15; Creatinine, Ser 1.10; Hemoglobin 11.3; Platelets 321; Potassium 4.3; Sodium 130  Recent Lipid Panel No results found for: CHOL, TRIG, HDL, CHOLHDL, VLDL, LDLCALC, LDLDIRECT  Physical Exam:    VS:  BP (!) 120/46   Pulse 62   Ht 5' 4.5" (1.638 m)   SpO2 98%   BMI 31.60 kg/m     Wt Readings from Last 3 Encounters:  02/26/20 187 lb (84.8 kg)  01/07/20 175 lb (79.4 kg)  11/14/19 173 lb (78.5 kg)     GEN: Well nourished, well developed in no acute distress HEENT: Normal NECK: No JVD; No carotid bruits LYMPHATICS: No lymphadenopathy CARDIAC: S1S2 noted,RRR, no murmurs, rubs, gallops RESPIRATORY:  Clear to auscultation without rales, wheezing or rhonchi  ABDOMEN: Soft, non-tender, non-distended, +bowel sounds, no guarding. EXTREMITIES: No edema, No cyanosis, no clubbing MUSCULOSKELETAL:  No deformity  SKIN: Warm and dry NEUROLOGIC:  Alert and oriented x 3, non-focal PSYCHIATRIC:  Normal affect, good insight  ASSESSMENT:    1. PVC's (premature ventricular contractions)   2. Paroxysmal atrial fibrillation (HCC)   3. Essential  hypertension   4. Dizziness   5. Protein S deficiency ([place])   6. TIA (transient ischemic attack)    PLAN:     1.  She is status post left ankle bimalleolar fracture and is nonweightbearing for 6 weeks.  She is recovering well.  2.  Her blood pressure is acceptable in the office today no changes will be made to current antihypertensive regimen.   3.  Atrial fibrillation-continue patient on her current anticoagulation and her rate control agent.  4. Continue the lipitor 40 mg daily.   5. No recurrent dizziness but she is not active- we will revisit once she is off non weight bearing.   The patient is in agreement with the above plan. The patient left the office in stable condition.  The patient will follow up in 3 months.    Medication Adjustments/Labs and Tests Ordered: Current medicines are reviewed at length with the patient today.  Concerns regarding medicines are outlined above.  No orders of the defined types were placed in this encounter.  No orders of the defined types were placed in this encounter.   Patient Instructions  Medication Instructions:  Your physician recommends that you continue on your current medications as directed. Please refer to the Current Medication list given to you today.  *If you need a refill on your cardiac medications before your next appointment, please call your pharmacy*   Lab Work: None ordered   If you have labs (blood work)  drawn today and your tests are completely normal, you will receive your results only by: Marland Kitchen MyChart Message (if you have MyChart) OR . A paper copy in the mail If you have any lab test that is abnormal or we need to change your treatment, we will call you to review the results.   Testing/Procedures: None ordered    Follow-Up: At [facility], you and your health needs are our priority.  As part of our continuing mission to provide you with exceptional heart care, we have created designated Provider Care  Teams.  These Care Teams include your primary Cardiologist (physician) and Advanced Practice Providers (APPs -  Physician Assistants and Nurse Practitioners) who all work together to provide you with the care you need, when you need it.  We recommend signing up for the patient portal called "MyChart".  Sign up information is provided on this After Visit Summary.  MyChart is used to connect with patients for Virtual Visits (Telemedicine).  Patients are able to view lab/test results, encounter notes, upcoming appointments, etc.  Non-urgent messages can be sent to your provider as well.   To learn more about what you can do with MyChart, go to [email or web address].    Your next appointment:   3 month(s)  The format for your next appointment:   In Person  Provider:   Berniece Salines, DO   Other Instructions None      Adopting a Healthy Lifestyle.  Know what a healthy weight is for you (roughly BMI <25) and aim to maintain this   Aim for 7+ servings of fruits and vegetables daily   65-80+ fluid ounces of water or unsweet tea for healthy kidneys   Limit to max 1 drink of alcohol per day; avoid smoking/tobacco   Limit animal fats in diet for cholesterol and heart health - choose grass fed whenever available   Avoid highly processed foods, and foods high in saturated/trans fats   Aim for low stress - take time to unwind and care for your mental health   Aim for 150 min of moderate intensity exercise weekly for heart health, and weights twice weekly for bone health   Aim for 7-9 hours of sleep daily   When it comes to diets, agreement about the perfect plan isnt easy to find, even among the experts. Experts at the [employer] developed an idea known as the Healthy Eating Plate. Just imagine a plate divided into logical, healthy portions.   The emphasis is on diet quality:   Load up on vegetables and fruits - one-half of your plate: Aim for color and variety,  and remember that potatoes dont count.   Go for whole grains - one-quarter of your plate: Whole wheat, barley, wheat berries, quinoa, oats, brown rice, and foods made with them. If you want pasta, go with whole wheat pasta.   Protein power - one-quarter of your plate: Fish, chicken, beans, and nuts are all healthy, versatile protein sources. Limit red meat.   The diet, however, does go beyond the plate, offering a few other suggestions.   Use healthy plant oils, such as olive, canola, soy, corn, sunflower and peanut. Check the labels, and avoid partially hydrogenated oil, which have unhealthy trans fats.   If youre thirsty, drink water. Coffee and tea are good in moderation, but skip sugary drinks and limit milk and dairy products to one or two daily servings.   The type of carbohydrate in the diet is more important  than the amount. Some sources of carbohydrates, such as vegetables, fruits, whole grains, and beans-are healthier than others.   Finally, stay active  Signed, Berniece Salines, DO  03/05/2020 12:33 PM    [facility]

## 2020-03-06 DIAGNOSIS — I10 Essential (primary) hypertension: Secondary | ICD-10-CM | POA: Diagnosis not present

## 2020-03-06 DIAGNOSIS — D649 Anemia, unspecified: Secondary | ICD-10-CM | POA: Diagnosis not present

## 2020-03-06 DIAGNOSIS — S82842D Displaced bimalleolar fracture of left lower leg, subsequent encounter for closed fracture with routine healing: Secondary | ICD-10-CM | POA: Diagnosis not present

## 2020-03-06 DIAGNOSIS — E119 Type 2 diabetes mellitus without complications: Secondary | ICD-10-CM | POA: Diagnosis not present

## 2020-03-06 DIAGNOSIS — M1711 Unilateral primary osteoarthritis, right knee: Secondary | ICD-10-CM | POA: Diagnosis not present

## 2020-03-06 DIAGNOSIS — E7800 Pure hypercholesterolemia, unspecified: Secondary | ICD-10-CM | POA: Diagnosis not present

## 2020-03-06 DIAGNOSIS — K219 Gastro-esophageal reflux disease without esophagitis: Secondary | ICD-10-CM | POA: Diagnosis not present

## 2020-03-06 DIAGNOSIS — E669 Obesity, unspecified: Secondary | ICD-10-CM | POA: Diagnosis not present

## 2020-03-06 DIAGNOSIS — I4891 Unspecified atrial fibrillation: Secondary | ICD-10-CM | POA: Diagnosis not present

## 2020-03-10 ENCOUNTER — Ambulatory Visit (INDEPENDENT_AMBULATORY_CARE_PROVIDER_SITE_OTHER): Payer: Medicare PPO | Admitting: Endocrinology

## 2020-03-10 ENCOUNTER — Encounter: Payer: Self-pay | Admitting: Endocrinology

## 2020-03-10 ENCOUNTER — Other Ambulatory Visit: Payer: Self-pay

## 2020-03-10 VITALS — BP 128/72 | HR 67 | Ht 64.5 in | Wt 177.2 lb

## 2020-03-10 DIAGNOSIS — E871 Hypo-osmolality and hyponatremia: Secondary | ICD-10-CM

## 2020-03-10 DIAGNOSIS — Z4789 Encounter for other orthopedic aftercare: Secondary | ICD-10-CM | POA: Insufficient documentation

## 2020-03-10 HISTORY — DX: Encounter for other orthopedic aftercare: Z47.89

## 2020-03-10 MED ORDER — COSYNTROPIN 0.25 MG IJ SOLR
0.2500 mg | Freq: Once | INTRAMUSCULAR | Status: AC
  Administered 2020-03-10: 0.25 mg via INTRAMUSCULAR

## 2020-03-10 NOTE — Progress Notes (Signed)
Subjective:    Patient ID: Pamela [name], female    DOB: 1946-05-02, 74 y.o.   MRN: [id number]  HPI Pt is referred by Dr Harriet Masson, to exclude endocrine cause of postural hypotension.  Pt reports postural hypotension x 3 years.  dtr says pt had 2 syncopal episodes 4 weeks ago, a few days after starting Cymbalta.  On 1 occasion, she fx left leg.  She has nocturnal hypoglycemia, but pt feels this is different from hypotension/syncope.  She takes low-dose ACEI, for renal protection.  She took opiods/tramadol for just a brief time after fx leg.  no h/o brain injury.  No h/o cancer, thyroid problems, seizures, amyloidosis, or tuberculosis.  No recent steroid therapy.  No h/o ketoconazole, rifampin, or dilantin.  Pt was first noted to have hyponatremia in 2014.  She has no h/o duiretic, adrenal insuff, SIADH, cirrhosis, chronic pain, brain injury, pseudo, pulm dz, chemotx, GBS.  Past Medical History:  Diagnosis Date  . Anemia   . Asthma   . Atrial fibrillation [I48.91] 10/01/2014  . Bilateral leg cramps   . Chronic anticoagulation   . Chronic constipation   . Complication of anesthesia   . Diabetes mellitus   . Dizziness 05/23/2011  . DJD (degenerative joint disease)   . DOE (dyspnea on exertion) 03/03/2013   Onset early 2020  - 02/05/2019   Walked RA  2 laps @  approx 217ft each @ nl pace  stopped due to  Light headed, no sp or sob, no desats    . DVT (deep venous thrombosis) ([place]) 07/30/2013  . Dyspnea    occasionally  . GERD (gastroesophageal reflux disease)   . Heart murmur    as a child  . HTN (hypertension)   . Hypercholesteremia   . Hypertension 05/23/2011  . Hyponatremia 02/06/2019   Na 126 on no obvious meds to cause it with last na 133 in 2014   . Long term (current) use of anticoagulants 03/03/2013  . Low back pain 07/06/2018  . Low back strain 08/09/2018  . Obesity   . Osteoarthritis of right knee 07/06/2018  . Pain in right knee 07/06/2018  . Pneumonia   . PONV  (postoperative nausea and vomiting)    needs pre-medication  . Protein S deficiency ([place])    prior DVT L knee, L arm DVT,  followed by Dr West Pugh at [facility]  . PVC's (premature ventricular contractions)   . S/P cardiac catheterization 02/2013   Normal coronaries; low normal EF at 50%  . Sleep apnea    can't use the cpap (diagnosed 2017)  . Solitary pulmonary nodule on lung CT 02/06/2019   First noted 01/13/2014 > no change as of 12/21/2018  . Spondylolisthesis, lumbar region 08/09/2018  . Stroke [facility])    TIA in 2018 or 2019  . TIA (transient ischemic attack)     Past Surgical History:  Procedure Laterality Date  . CARPAL TUNNEL RELEASE  1994   bilateral  . CHOLECYSTECTOMY  1993  . COLONOSCOPY    . FOOT TENDON SURGERY Right   . left knee replacement  2006  . ORIF ANKLE FRACTURE Left 02/26/2020   Procedure: OPEN REDUCTION INTERNAL FIXATION (ORIF) ANKLE FRACTURE;  Surgeon: Nicholes Stairs, MD;  Location: [facility];  Service: Orthopedics;  Laterality: Left;  90 mins  . thumb surgery Left 2018  . TONSILLECTOMY    . TOTAL ABDOMINAL HYSTERECTOMY  1988    Social History   Socioeconomic History  .  Marital status: Married    Spouse name: [name]  . Number of children: Not on file  . Years of education: Not on file  . Highest education level: Not on file  Occupational History  . Not on file  Tobacco Use  . Smoking status: Never Smoker  . Smokeless tobacco: Never Used  Vaping Use  . Vaping Use: Never used  Substance and Sexual Activity  . Alcohol use: No  . Drug use: No  . Sexual activity: Not on file  Other Topics Concern  . Not on file  Social History Narrative   Lives with spouse in [place].  2 grown daughters.   Retired Glass blower/designer     Social Determinants of Radio broadcast assistant Strain:   . Difficulty of Paying Living Expenses:   Food Insecurity:   . Worried About Charity fundraiser in the Last Year:   . Arboriculturist in the Last Year:   Transportation  Needs:   . Film/video editor (Medical):   Marland Kitchen Lack of Transportation (Non-Medical):   Physical Activity:   . Days of Exercise per Week:   . Minutes of Exercise per Session:   Stress:   . Feeling of Stress :   Social Connections:   . Frequency of Communication with Friends and Family:   . Frequency of Social Gatherings with Friends and Family:   . Attends Religious Services:   . Active Member of Clubs or Organizations:   . Attends Archivist Meetings:   Marland Kitchen Marital Status:   Intimate Partner Violence:   . Fear of Current or Ex-Partner:   . Emotionally Abused:   Marland Kitchen Physically Abused:   . Sexually Abused:     Current Outpatient Medications on File Prior to Visit  Medication Sig Dispense Refill  . acetaminophen (TYLENOL) 500 MG tablet Take 500-1,000 mg by mouth every 6 (six) hours as needed (for pain.).    Marland Kitchen albuterol (VENTOLIN HFA) 108 (90 Base) MCG/ACT inhaler Inhale 1-2 puffs into the lungs every 6 (six) hours as needed for wheezing or shortness of breath.     Marland Kitchen aspirin EC 81 MG tablet Take 81 mg by mouth daily.    Marland Kitchen atorvastatin (LIPITOR) 40 MG tablet Take 40 mg by mouth daily at 12 noon.     Marland Kitchen azelastine (ASTELIN) 137 MCG/SPRAY nasal spray Place 2 sprays into the nose daily as needed for rhinitis or allergies.     . B Complex-C (B-COMPLEX WITH VITAMIN C) tablet Take 1 tablet by mouth daily with lunch.    . carvedilol (COREG) 12.5 MG tablet Take 1 tablet (12.5 mg total) by mouth 2 (two) times daily. 180 tablet 3  . Cholecalciferol (VITAMIN D3) 50 MCG (2000 UT) TABS Take 2,000 Units by mouth daily with lunch.     . denosumab (PROLIA) 60 MG/ML SOSY injection Inject 60 mg into the skin every 6 (six) months.    . Dulaglutide (TRULICITY) 1.5 XI/3.3AS SOPN Inject 1.5 mg into the skin every Thursday.     . ferrous sulfate 325 (65 FE) MG tablet Take 325 mg by mouth daily.    . furosemide (LASIX) 20 MG tablet Take 20 mg by mouth daily.    . Insulin Glargine, 1 Unit Dial, (TOUJEO  SOLOSTAR) 300 UNIT/ML SOPN Inject 17 Units into the skin daily.     Marland Kitchen ipratropium-albuterol (DUONEB) 0.5-2.5 (3) MG/3ML SOLN Take 3 mLs by nebulization 4 (four) times daily as needed (wheezing/shortness of breath).  0  . lisinopril (ZESTRIL) 2.5 MG tablet Take 2.5 mg by mouth daily.     . Magnesium Oxide 400 (240 Mg) MG TABS Take 800 mg by mouth 2 (two) times daily.    . Menthol-Methyl Salicylate (SALONPAS PAIN RELIEF PATCH) [facility] Apply 1 patch topically daily as needed (pain.).    Marland Kitchen ondansetron (ZOFRAN ODT) 4 MG disintegrating tablet Take 1 tablet (4 mg total) by mouth every 8 (eight) hours as needed. 20 tablet 0  . ondansetron (ZOFRAN-ODT) 8 MG disintegrating tablet Take 8 mg by mouth every 8 (eight) hours as needed for nausea or vomiting.     Marland Kitchen oxyCODONE (OXY IR/ROXICODONE) 5 MG immediate release tablet Take 1 tablet (5 mg total) by mouth every 6 (six) hours as needed for severe pain. 20 tablet 0  . pantoprazole (PROTONIX) 40 MG tablet Take 1 tablet (40 mg total) by mouth daily. Take 30-60 min before first meal of the day (Patient taking differently: Take 40 mg by mouth daily before breakfast. Take 30-60 min before first meal of the day) 30 tablet 2  . pioglitazone (ACTOS) 30 MG tablet Take 30 mg by mouth daily.     . polyethylene glycol (MIRALAX / GLYCOLAX) 17 g packet Take 17 g by mouth as needed.     . senna-docusate (SENOKOT S) 8.6-50 MG tablet Take 1 tablet by mouth at bedtime.    . sodium chloride (OCEAN) 0.65 % SOLN nasal spray Place 1-2 sprays into both nostrils 4 (four) times daily as needed for congestion.    Marland Kitchen tolterodine (DETROL LA) 2 MG 24 hr capsule Take 2 mg by mouth at bedtime.     . traMADol (ULTRAM) 50 MG tablet Take 1 tablet (50 mg total) by mouth every 6 (six) hours as needed for moderate pain. 30 tablet 0  . warfarin (COUMADIN) 1 MG tablet Take 0.5 mg by mouth See admin instructions. Take 0.5 tablet (0.5 mg) with a 5 mg tablet on Sundays, Tuesdays, Thursdays, Saturdays.    Marland Kitchen  warfarin (COUMADIN) 5 MG tablet Take 5 mg by mouth daily.     No current facility-administered medications on file prior to visit.    Allergies  Allergen Reactions  . Ketek [Telithromycin] Nausea And Vomiting and Rash  . Loxapine Succinate Hives  . Naproxen Sodium Anaphylaxis and Hives  . Amoxapine And Related Hives  . Darvon Nausea And Vomiting  . Belsomra [Suvorexant]     "Nightmares"  . Cymbalta [Duloxetine Hcl]     "Blackouts"  . Amoxicillin Rash  . Propoxyphene Nausea And Vomiting    Family History  Problem Relation Age of Onset  . Cirrhosis Mother   . Antithrombin III deficiency Mother        multiple emboli  . Ulcerative colitis Mother   . Coronary artery disease Father   . Heart attack Father   . Hypertension Father   . Protein S deficiency Sister        prior DVT  . Protein S deficiency Daughter   . Hypertension Sister   . Hypertension Brother   . Heart attack Brother   . Heart attack Sister   . Stroke Neg Hx   . Colitis Neg Hx   . Colon polyps Neg Hx   . Esophageal cancer Neg Hx   . Liver cancer Neg Hx   . Pancreatic cancer Neg Hx   . Rectal cancer Neg Hx   . Stomach cancer Neg Hx     BP 128/72  Pulse 67   Ht 5' 4.5" (1.638 m)   Wt 177 lb 3.2 oz (80.4 kg)   SpO2 97%   BMI 29.95 kg/m    Review of Systems Denies fever, headache, n/v, and or change in skin tone.  She has gained a few lbs.  She has fatigue and rhinorrhea.      Objective:   Physical Exam VS: see vs page GEN: no distress HEAD: head: no deformity eyes: no periorbital swelling, no proptosis external nose and ears are normal NECK: supple, thyroid is not enlarged CHEST WALL: no deformity LUNGS: clear to auscultation CV: reg rate and rhythm, no murmur.  MUSCULOSKELETAL: muscle bulk and strength are grossly normal.  no obvious joint swelling.  gait is normal and steady.  Left leg is bandaged.   EXTEMITIES: no deformity.  no edema PULSES: no carotid bruit NEURO:  cn 2-12 grossly  intact.   readily moves all 4's.  sensation is intact to touch on all 4's.   SKIN:  Normal texture and temperature.  No rash or suspicious lesion is visible.   NODES:  None palpable at the neck PSYCH: alert, well-oriented.  Does not appear anxious nor depressed.  Lab Results  Component Value Date   CREATININE 1.10 (H) 02/26/2020   BUN 15 02/26/2020   NA 130 (L) 02/26/2020   K 4.3 02/26/2020   CL 92 (L) 02/26/2020   CO2 26 02/26/2020    Lab Results  Component Value Date   TSH 2.020 11/14/2019   ACTH stimulation test is done: baseline cortisol level=12 then Cosyntropin 250 mcg is given im 45 minutes later, cortisol level=25 (normal response).    Head CT (2001): normal  Lab Results  Component Value Date   HGBA1C 7.4 (H) 02/26/2020   I have reviewed outside records, and summarized: Pt was noted to have hyponatremia, and referred here.  She was seen in ER with syncope, and was dx'ed with left bimalleolar ankle fx      Assessment & Plan:  Postural hypotension.  Adrenal insuff is excluded as a cause.  Hyponatremia, new to me: uncertain etiology.  I offered to rx with demeclocycline, and f/u here.    Patient Instructions  Blood tests are requested for you today.  We'll let you know about the results.

## 2020-03-10 NOTE — Patient Instructions (Signed)
Blood tests are requested for you today.  We'll let you know about the results.  

## 2020-03-11 DIAGNOSIS — S82842D Displaced bimalleolar fracture of left lower leg, subsequent encounter for closed fracture with routine healing: Secondary | ICD-10-CM | POA: Diagnosis not present

## 2020-03-11 DIAGNOSIS — D649 Anemia, unspecified: Secondary | ICD-10-CM | POA: Diagnosis not present

## 2020-03-11 DIAGNOSIS — E669 Obesity, unspecified: Secondary | ICD-10-CM | POA: Diagnosis not present

## 2020-03-11 DIAGNOSIS — I4891 Unspecified atrial fibrillation: Secondary | ICD-10-CM | POA: Diagnosis not present

## 2020-03-11 DIAGNOSIS — M25572 Pain in left ankle and joints of left foot: Secondary | ICD-10-CM | POA: Diagnosis not present

## 2020-03-11 DIAGNOSIS — M1711 Unilateral primary osteoarthritis, right knee: Secondary | ICD-10-CM | POA: Diagnosis not present

## 2020-03-11 DIAGNOSIS — Z4789 Encounter for other orthopedic aftercare: Secondary | ICD-10-CM | POA: Diagnosis not present

## 2020-03-11 DIAGNOSIS — I10 Essential (primary) hypertension: Secondary | ICD-10-CM | POA: Diagnosis not present

## 2020-03-11 DIAGNOSIS — E119 Type 2 diabetes mellitus without complications: Secondary | ICD-10-CM | POA: Diagnosis not present

## 2020-03-11 DIAGNOSIS — K219 Gastro-esophageal reflux disease without esophagitis: Secondary | ICD-10-CM | POA: Diagnosis not present

## 2020-03-11 DIAGNOSIS — E7800 Pure hypercholesterolemia, unspecified: Secondary | ICD-10-CM | POA: Diagnosis not present

## 2020-03-11 LAB — TRIGLYCERIDES: Triglycerides: 74 mg/dL (ref 0.0–149.0)

## 2020-03-11 LAB — BRAIN NATRIURETIC PEPTIDE: Pro B Natriuretic peptide (BNP): 135 pg/mL — ABNORMAL HIGH (ref 0.0–100.0)

## 2020-03-11 LAB — CORTISOL
Cortisol, Plasma: 11.7 ug/dL
Cortisol, Plasma: 25 ug/dL

## 2020-03-11 LAB — BASIC METABOLIC PANEL
BUN: 24 mg/dL — ABNORMAL HIGH (ref 6–23)
CO2: 31 mEq/L (ref 19–32)
Calcium: 9 mg/dL (ref 8.4–10.5)
Chloride: 89 mEq/L — ABNORMAL LOW (ref 96–112)
Creatinine, Ser: 0.94 mg/dL (ref 0.40–1.20)
GFR: 57.98 mL/min — ABNORMAL LOW (ref >60.00)
Glucose, Bld: 132 mg/dL — ABNORMAL HIGH (ref 70–99)
Potassium: 4.5 mEq/L (ref 3.5–5.1)
Sodium: 126 mEq/L — ABNORMAL LOW (ref 135–145)

## 2020-03-12 ENCOUNTER — Telehealth: Payer: Self-pay | Admitting: Endocrinology

## 2020-03-12 NOTE — Telephone Encounter (Signed)
OK, that is fine.  The medication I will prescribe can possible interfere with the warfarin, so pt will need to schedule a f/u test 2-3 days after starting it.  Can pt make sure of this before I prescribe?

## 2020-03-12 NOTE — Telephone Encounter (Signed)
Following message sent by Dr. Loanne Drilling to pt via My Chart:  Hi [name]: The "before and after" test is normal, so we don't find a cause for the blood pressure. The sodium is still low. The "BNP" is high. This means you have excess fluid in the body. You have 2 options. I would be happy to prescribe a medication for this, then to follow this up. The other option is for your heart doctor to take care of this. Please let us know.  Routing this message to Dr. Loanne Drilling for him to review and address via My Chart

## 2020-03-12 NOTE — Telephone Encounter (Signed)
Patient called re: Patient states she spoke to Dr. Loanne Drilling earlier about her labs and Dr. Loanne Drilling asked patient if patient preferred Dr. Loanne Drilling or her Cardiologist prescribe medication for fluid retention. Patient prefers and requests Dr. Loanne Drilling prescribe the medication and follow up on progress. Patient also requests that Dr. Loanne Drilling follow up on patient's Diabetes. Patient requests that she be informed when Dr. Loanne Drilling would like for patient to schedule an appointment. Patient's PHARM is: [facility] [phone] - [address] [address], [address] AT [address] Phone:  [phone]  Fax:  [phone]

## 2020-03-13 DIAGNOSIS — E119 Type 2 diabetes mellitus without complications: Secondary | ICD-10-CM | POA: Diagnosis not present

## 2020-03-13 DIAGNOSIS — K219 Gastro-esophageal reflux disease without esophagitis: Secondary | ICD-10-CM | POA: Diagnosis not present

## 2020-03-13 DIAGNOSIS — D649 Anemia, unspecified: Secondary | ICD-10-CM | POA: Diagnosis not present

## 2020-03-13 DIAGNOSIS — E7800 Pure hypercholesterolemia, unspecified: Secondary | ICD-10-CM | POA: Diagnosis not present

## 2020-03-13 DIAGNOSIS — I4891 Unspecified atrial fibrillation: Secondary | ICD-10-CM | POA: Diagnosis not present

## 2020-03-13 DIAGNOSIS — M1711 Unilateral primary osteoarthritis, right knee: Secondary | ICD-10-CM | POA: Diagnosis not present

## 2020-03-13 DIAGNOSIS — E669 Obesity, unspecified: Secondary | ICD-10-CM | POA: Diagnosis not present

## 2020-03-13 DIAGNOSIS — S82842D Displaced bimalleolar fracture of left lower leg, subsequent encounter for closed fracture with routine healing: Secondary | ICD-10-CM | POA: Diagnosis not present

## 2020-03-13 DIAGNOSIS — I10 Essential (primary) hypertension: Secondary | ICD-10-CM | POA: Diagnosis not present

## 2020-03-16 DIAGNOSIS — R5383 Other fatigue: Secondary | ICD-10-CM | POA: Diagnosis not present

## 2020-03-16 DIAGNOSIS — M150 Primary generalized (osteo)arthritis: Secondary | ICD-10-CM | POA: Diagnosis not present

## 2020-03-16 DIAGNOSIS — M2550 Pain in unspecified joint: Secondary | ICD-10-CM | POA: Diagnosis not present

## 2020-03-16 DIAGNOSIS — M8000XD Age-related osteoporosis with current pathological fracture, unspecified site, subsequent encounter for fracture with routine healing: Secondary | ICD-10-CM | POA: Diagnosis not present

## 2020-03-16 DIAGNOSIS — M5136 Other intervertebral disc degeneration, lumbar region: Secondary | ICD-10-CM | POA: Diagnosis not present

## 2020-03-16 DIAGNOSIS — M542 Cervicalgia: Secondary | ICD-10-CM | POA: Diagnosis not present

## 2020-03-16 DIAGNOSIS — R768 Other specified abnormal immunological findings in serum: Secondary | ICD-10-CM | POA: Diagnosis not present

## 2020-03-17 ENCOUNTER — Ambulatory Visit (INDEPENDENT_AMBULATORY_CARE_PROVIDER_SITE_OTHER): Payer: Medicare PPO | Admitting: Otolaryngology

## 2020-03-17 ENCOUNTER — Other Ambulatory Visit: Payer: Self-pay

## 2020-03-17 VITALS — Temp 96.8°F

## 2020-03-17 DIAGNOSIS — R42 Dizziness and giddiness: Secondary | ICD-10-CM

## 2020-03-17 DIAGNOSIS — J3489 Other specified disorders of nose and nasal sinuses: Secondary | ICD-10-CM | POA: Diagnosis not present

## 2020-03-17 DIAGNOSIS — H8112 Benign paroxysmal vertigo, left ear: Secondary | ICD-10-CM | POA: Diagnosis not present

## 2020-03-17 NOTE — Progress Notes (Signed)
HPI: Pamela [name] is a 74 y.o. female who presents is referred by Dr. Harriet Masson for evaluation of dizziness.  She has also had occasional bleeding from her nose and wanted this checked.  She presents today with her daughter.  Apparently she recently broke her left leg and has difficulty with mobility.  She has previously been to balance therapy because she has had dizzy problems for a number of years.  She has hearing aids that she wears occasionally but has not noticed any significant change in her hearing recently. Her balance seems to be worse when she first stands up.  She has occasional bouts of vertigo at night.  Some of her dizziness is just imbalance and then she occasionally has vertigo or spinning.  But no prolonged spinning for more than 1 hour.  She has not noted any significant change in her hearing recently. She denies any previous surgery on her nose.  Past Medical History:  Diagnosis Date  . Anemia   . Asthma   . Atrial fibrillation [I48.91] 10/01/2014  . Bilateral leg cramps   . Chronic anticoagulation   . Chronic constipation   . Complication of anesthesia   . Diabetes mellitus   . Dizziness 05/23/2011  . DJD (degenerative joint disease)   . DOE (dyspnea on exertion) 03/03/2013   Onset early 2020  - 02/05/2019   Walked RA  2 laps @  approx 28ft each @ nl pace  stopped due to  Light headed, no sp or sob, no desats    . DVT (deep venous thrombosis) ([place]) 07/30/2013  . Dyspnea    occasionally  . GERD (gastroesophageal reflux disease)   . Heart murmur    as a child  . HTN (hypertension)   . Hypercholesteremia   . Hypertension 05/23/2011  . Hyponatremia 02/06/2019   Na 126 on no obvious meds to cause it with last na 133 in 2014   . Long term (current) use of anticoagulants 03/03/2013  . Low back pain 07/06/2018  . Low back strain 08/09/2018  . Obesity   . Osteoarthritis of right knee 07/06/2018  . Pain in right knee 07/06/2018  . Pneumonia   . PONV (postoperative  nausea and vomiting)    needs pre-medication  . Protein S deficiency ([place])    prior DVT L knee, L arm DVT,  follow[facility] by Dr West Pugh at [facility]  . PVC's (premature ventricular contractions)   . S/P cardiac catheterization 02/2013   Normal coronaries; low normal EF at 50%  . Sleep apnea    can't use the cpap (diagnosed 2017)  . Solitary pulmonary nodule on lung CT 02/06/2019   First noted 01/13/2014 > no change as of 12/21/2018  . Spondylolisthesis, lumbar region 08/09/2018  . Stroke [facility])    TIA in 2018 or 2019  . TIA (transient ischemic attack)    Past Surgical History:  Procedure Laterality Date  . CARPAL TUNNEL RELEASE  1994   bilateral  . CHOLECYSTECTOMY  1993  . COLONOSCOPY    . FOOT TENDON SURGERY Right   . left knee replacement  2006  . ORIF ANKLE FRACTURE Left 02/26/2020   Procedure: OPEN REDUCTION INTERNAL FIXATION (ORIF) ANKLE FRACTURE;  Surgeon: Nicholes Stairs, MD;  Location: [place];  Service: Orthopedics;  Laterality: Left;  90 mins  . thumb surgery Left 2018  . TONSILLECTOMY    . TOTAL ABDOMINAL HYSTERECTOMY  1988   Social History   Socioeconomic History  . Marital status:  Married    Spouse name: [name]  . Number of children: Not on file  . Years of education: Not on file  . Highest education level: Not on file  Occupational History  . Not on file  Tobacco Use  . Smoking status: Never Smoker  . Smokeless tobacco: Never Used  Vaping Use  . Vaping Use: Never used  Substance and Sexual Activity  . Alcohol use: No  . Drug use: No  . Sexual activity: Not on file  Other Topics Concern  . Not on file  Social History Narrative   Lives with spouse in [place].  2 grown daughters.   Retired Glass blower/designer     Social Determinants of Radio broadcast assistant Strain:   . Difficulty of Paying Living Expenses:   Food Insecurity:   . Worried About Charity fundraiser in the Last Year:   . Arboriculturist in the Last Year:   Transportation Needs:   . Lexicographer (Medical):   Marland Kitchen Lack of Transportation (Non-Medical):   Physical Activity:   . Days of Exercise per Week:   . Minutes of Exercise per Session:   Stress:   . Feeling of Stress :   Social Connections:   . Frequency of Communication with Friends and Family:   . Frequency of Social Gatherings with Friends and Family:   . Attends Religious Services:   . Active Member of Clubs or Organizations:   . Attends Archivist Meetings:   Marland Kitchen Marital Status:    Family History  Problem Relation Age of Onset  . Cirrhosis Mother   . Antithrombin III deficiency Mother        multiple emboli  . Ulcerative colitis Mother   . Coronary artery disease Father   . Heart attack Father   . Hypertension Father   . Protein S deficiency Sister        prior DVT  . Protein S deficiency Daughter   . Hypertension Sister   . Hypertension Brother   . Heart attack Brother   . Heart attack Sister   . Stroke Neg Hx   . Colitis Neg Hx   . Colon polyps Neg Hx   . Esophageal cancer Neg Hx   . Liver cancer Neg Hx   . Pancreatic cancer Neg Hx   . Rectal cancer Neg Hx   . Stomach cancer Neg Hx    Allergies  Allergen Reactions  . Ketek [Telithromycin] Nausea And Vomiting and Rash  . Loxapine Succinate Hives  . Naproxen Sodium Anaphylaxis and Hives  . Amoxapine And Related Hives  . Darvon Nausea And Vomiting  . Belsomra [Suvorexant]     "Nightmares"  . Cymbalta [Duloxetine Hcl]     "Blackouts"  . Amoxicillin Rash  . Propoxyphene Nausea And Vomiting   Prior to Admission medications   Medication Sig Start Date End Date Taking? Authorizing Provider  acetaminophen (TYLENOL) 500 MG tablet Take 500-1,000 mg by mouth every 6 (six) hours as needed (for pain.).    [provider]  albuterol (VENTOLIN HFA) 108 (90 Base) MCG/ACT inhaler Inhale 1-2 puffs into the lungs every 6 (six) hours as needed for wheezing or shortness of breath.  09/17/19   [provider]  aspirin EC 81 MG  tablet Take 81 mg by mouth daily.    [provider]  atorvastatin (LIPITOR) 40 MG tablet Take 40 mg by mouth daily at 12 noon.  [provider]  azelastine (ASTELIN) 137 MCG/SPRAY nasal spray Place 2 sprays into the nose daily as needed for rhinitis or allergies.  03/02/13   [provider]  B Complex-C (B-COMPLEX WITH VITAMIN C) tablet Take 1 tablet by mouth daily with lunch.    [provider]  carvedilol (COREG) 12.5 MG tablet Take 1 tablet (12.5 mg total) by mouth 2 (two) times daily. 01/07/20 04/06/20  Tobb, Godfrey Pick, DO  Cholecalciferol (VITAMIN D3) 50 MCG (2000 UT) TABS Take 2,000 Units by mouth daily with lunch.     [provider]  denosumab (PROLIA) 60 MG/ML SOSY injection Inject 60 mg into the skin every 6 (six) months.    [provider]  Dulaglutide (TRULICITY) 1.5 EZ/6.6QH SOPN Inject 1.5 mg into the skin every Thursday.     [provider]  ferrous sulfate 325 (65 FE) MG tablet Take 325 mg by mouth daily.    [provider]  furosemide (LASIX) 20 MG tablet Take 20 mg by mouth daily. 01/01/20   [provider]  Insulin Glargine, 1 Unit Dial, (TOUJEO SOLOSTAR) 300 UNIT/ML SOPN Inject 17 Units into the skin daily.     [provider]  ipratropium-albuterol (DUONEB) 0.5-2.5 (3) MG/3ML SOLN Take 3 mLs by nebulization 4 (four) times daily as needed (wheezing/shortness of breath).  11/07/17   [provider]  lisinopril (ZESTRIL) 2.5 MG tablet Take 2.5 mg by mouth daily.  09/12/19   [provider]  Magnesium Oxide 400 (240 Mg) MG TABS Take 800 mg by mouth 2 (two) times daily. 01/25/20   [provider]  Menthol-Methyl Salicylate (SALONPAS PAIN RELIEF PATCH) [place] Apply 1 patch topically daily as needed (pain.).    [provider]  ondansetron (ZOFRAN ODT) 4 MG disintegrating tablet Take 1 tablet (4 mg total) by mouth every 8 (eight) hours as needed. 02/28/20   Nicholes Stairs, MD  ondansetron (ZOFRAN-ODT) 8 MG disintegrating tablet Take 8 mg by mouth every 8 (eight) hours as needed for nausea or vomiting.     [provider]  oxyCODONE (OXY IR/ROXICODONE) 5 MG immediate release tablet Take 1 tablet (5 mg total) by mouth every 6 (six) hours as needed for severe pain. 02/28/20   Nicholes Stairs, MD  pantoprazole (PROTONIX) 40 MG tablet Take 1 tablet (40 mg total) by mouth daily. Take 30-60 min before first meal of the day Patient taking differently: Take 40 mg by mouth daily before breakfast. Take 30-60 min before first meal of the day 02/05/19   Tanda Rockers, MD  pioglitazone (ACTOS) 30 MG tablet Take 30 mg by mouth daily.     [provider]  polyethylene glycol (MIRALAX / GLYCOLAX) 17 g packet Take 17 g by mouth as needed.     [provider]  senna-docusate (SENOKOT S) 8.6-50 MG tablet Take 1 tablet by mouth at bedtime.    [provider]  sodium chloride (OCEAN) 0.65 % SOLN nasal spray Place 1-2 sprays into both nostrils 4 (four) times daily as needed for congestion.    [provider]  tolterodine (DETROL LA) 2 MG 24 hr capsule Take 2 mg by mouth at bedtime.  10/07/19   [provider]  traMADol (ULTRAM) 50 MG tablet Take 1 tablet (50 mg total) by mouth every 6 (six) hours as needed for moderate pain. 02/28/20 02/27/21  Nicholes Stairs, MD  warfarin (COUMADIN) 1 MG tablet Take 0.5 mg by mouth See admin instructions.  Take 0.5 tablet (0.5 mg) with a 5 mg tablet on Sundays, Tuesdays, Thursdays, Saturdays.    [provider]  warfarin (COUMADIN) 5 MG tablet Take 5 mg by mouth daily.    [provider]     Positive ROS: Otherwise negative  All other systems have been reviewed and were otherwise negative with the exception of those mentioned in the HPI and as above.  Physical Exam: Constitutional: Alert, well-appearing, no acute distress Ears: External ears without lesions or  tenderness. Ear canals are clear bilaterally with intact, clear TMs.  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  On Dix-Hallpike testing patient did have some mild nystagmus with her lying back and head turned to the left. Nasal: External nose without lesions. Septum midline.  Of note she has a small anterior septal perforation with some crusting around the perforation.  Otherwise the nasal passages are clear with no abnormal lesions.. Clear nasal passages bilaterally with no signs of infection. Oral: Lips and gums without lesions. Tongue and palate mucosa without lesions. Posterior oropharynx clear. Neck: No palpable adenopathy or masses Respiratory: Breathing comfortably  Skin: No facial/neck lesions or rash noted.  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 Also reviewed with the patient as well as her daughter that she has some mild benign positional vertigo and gave her information on benign positional vertigo and information on the Epley maneuver. Septal perforation is what contributing to crusting and occasional bleeding.  Plan: Reviewed with the patient and her daughter concerning balance therapy and possible physical therapy to help with balance.  Also gave him information on benign positional vertigo and the Epley maneuver which may be beneficial in some cases of vertigo. Concerning the septal perforation recommended saline rinse and ointment to help reduce crusting and bleeding.  Reviewed with them that if she has any persistent bleeding could stop the bleeding with cotton balls and Afrin however she has not really had persistent bleeding as the bleeding generally stops on its own within a few minutes. She will follow-up as needed.   Radene Journey, MD   CC:

## 2020-03-18 DIAGNOSIS — I4891 Unspecified atrial fibrillation: Secondary | ICD-10-CM | POA: Diagnosis not present

## 2020-03-18 DIAGNOSIS — S82842D Displaced bimalleolar fracture of left lower leg, subsequent encounter for closed fracture with routine healing: Secondary | ICD-10-CM | POA: Diagnosis not present

## 2020-03-18 DIAGNOSIS — E7800 Pure hypercholesterolemia, unspecified: Secondary | ICD-10-CM | POA: Diagnosis not present

## 2020-03-18 DIAGNOSIS — E119 Type 2 diabetes mellitus without complications: Secondary | ICD-10-CM | POA: Diagnosis not present

## 2020-03-18 DIAGNOSIS — K219 Gastro-esophageal reflux disease without esophagitis: Secondary | ICD-10-CM | POA: Diagnosis not present

## 2020-03-18 DIAGNOSIS — M1711 Unilateral primary osteoarthritis, right knee: Secondary | ICD-10-CM | POA: Diagnosis not present

## 2020-03-18 DIAGNOSIS — E669 Obesity, unspecified: Secondary | ICD-10-CM | POA: Diagnosis not present

## 2020-03-18 DIAGNOSIS — D649 Anemia, unspecified: Secondary | ICD-10-CM | POA: Diagnosis not present

## 2020-03-18 DIAGNOSIS — I10 Essential (primary) hypertension: Secondary | ICD-10-CM | POA: Diagnosis not present

## 2020-03-20 LAB — ARGININE VASOPRESSIN HORMONE
ADH: <0.8 pg/mL (ref 0.0–4.7)
Osmolality Meas: 270 mOsmol/kg — ABNORMAL LOW (ref 280–301)

## 2020-03-24 DIAGNOSIS — K219 Gastro-esophageal reflux disease without esophagitis: Secondary | ICD-10-CM | POA: Diagnosis not present

## 2020-03-24 DIAGNOSIS — I10 Essential (primary) hypertension: Secondary | ICD-10-CM | POA: Diagnosis not present

## 2020-03-24 DIAGNOSIS — M1711 Unilateral primary osteoarthritis, right knee: Secondary | ICD-10-CM | POA: Diagnosis not present

## 2020-03-24 DIAGNOSIS — D649 Anemia, unspecified: Secondary | ICD-10-CM | POA: Diagnosis not present

## 2020-03-24 DIAGNOSIS — S82842D Displaced bimalleolar fracture of left lower leg, subsequent encounter for closed fracture with routine healing: Secondary | ICD-10-CM | POA: Diagnosis not present

## 2020-03-24 DIAGNOSIS — E7800 Pure hypercholesterolemia, unspecified: Secondary | ICD-10-CM | POA: Diagnosis not present

## 2020-03-24 DIAGNOSIS — I4891 Unspecified atrial fibrillation: Secondary | ICD-10-CM | POA: Diagnosis not present

## 2020-03-24 DIAGNOSIS — E669 Obesity, unspecified: Secondary | ICD-10-CM | POA: Diagnosis not present

## 2020-03-24 DIAGNOSIS — E119 Type 2 diabetes mellitus without complications: Secondary | ICD-10-CM | POA: Diagnosis not present

## 2020-03-24 NOTE — Telephone Encounter (Signed)
This is outside the scope of my practice

## 2020-03-24 NOTE — Telephone Encounter (Signed)
Patient called asking to speak to Dr Loanne Drilling or nurse regarding her warfarin - she stated Dr Bobby Rumpf would like for Dr Loanne Drilling to manage patient's warfarin. Please advise. Ph# [phone]

## 2020-03-25 NOTE — Telephone Encounter (Signed)
Called pt and made her aware of this, and she verbalized understanding.

## 2020-03-30 DIAGNOSIS — I10 Essential (primary) hypertension: Secondary | ICD-10-CM | POA: Diagnosis not present

## 2020-03-30 DIAGNOSIS — M1711 Unilateral primary osteoarthritis, right knee: Secondary | ICD-10-CM | POA: Diagnosis not present

## 2020-03-30 DIAGNOSIS — E119 Type 2 diabetes mellitus without complications: Secondary | ICD-10-CM | POA: Diagnosis not present

## 2020-03-30 DIAGNOSIS — K219 Gastro-esophageal reflux disease without esophagitis: Secondary | ICD-10-CM | POA: Diagnosis not present

## 2020-03-30 DIAGNOSIS — E7800 Pure hypercholesterolemia, unspecified: Secondary | ICD-10-CM | POA: Diagnosis not present

## 2020-03-30 DIAGNOSIS — I4891 Unspecified atrial fibrillation: Secondary | ICD-10-CM | POA: Diagnosis not present

## 2020-03-30 DIAGNOSIS — D649 Anemia, unspecified: Secondary | ICD-10-CM | POA: Diagnosis not present

## 2020-03-30 DIAGNOSIS — E669 Obesity, unspecified: Secondary | ICD-10-CM | POA: Diagnosis not present

## 2020-03-30 DIAGNOSIS — S82842D Displaced bimalleolar fracture of left lower leg, subsequent encounter for closed fracture with routine healing: Secondary | ICD-10-CM | POA: Diagnosis not present

## 2020-04-03 DIAGNOSIS — E7800 Pure hypercholesterolemia, unspecified: Secondary | ICD-10-CM | POA: Diagnosis not present

## 2020-04-03 DIAGNOSIS — E119 Type 2 diabetes mellitus without complications: Secondary | ICD-10-CM | POA: Diagnosis not present

## 2020-04-03 DIAGNOSIS — I4891 Unspecified atrial fibrillation: Secondary | ICD-10-CM | POA: Diagnosis not present

## 2020-04-03 DIAGNOSIS — M1711 Unilateral primary osteoarthritis, right knee: Secondary | ICD-10-CM | POA: Diagnosis not present

## 2020-04-03 DIAGNOSIS — E669 Obesity, unspecified: Secondary | ICD-10-CM | POA: Diagnosis not present

## 2020-04-03 DIAGNOSIS — I10 Essential (primary) hypertension: Secondary | ICD-10-CM | POA: Diagnosis not present

## 2020-04-03 DIAGNOSIS — D649 Anemia, unspecified: Secondary | ICD-10-CM | POA: Diagnosis not present

## 2020-04-03 DIAGNOSIS — S82842D Displaced bimalleolar fracture of left lower leg, subsequent encounter for closed fracture with routine healing: Secondary | ICD-10-CM | POA: Diagnosis not present

## 2020-04-03 DIAGNOSIS — K219 Gastro-esophageal reflux disease without esophagitis: Secondary | ICD-10-CM | POA: Diagnosis not present

## 2020-04-07 DIAGNOSIS — D6859 Other primary thrombophilia: Secondary | ICD-10-CM | POA: Diagnosis not present

## 2020-04-07 DIAGNOSIS — Z7901 Long term (current) use of anticoagulants: Secondary | ICD-10-CM | POA: Diagnosis not present

## 2020-04-08 ENCOUNTER — Ambulatory Visit: Payer: Medicare PPO | Admitting: Cardiology

## 2020-04-08 DIAGNOSIS — D649 Anemia, unspecified: Secondary | ICD-10-CM | POA: Diagnosis not present

## 2020-04-08 DIAGNOSIS — M1711 Unilateral primary osteoarthritis, right knee: Secondary | ICD-10-CM | POA: Diagnosis not present

## 2020-04-08 DIAGNOSIS — E7800 Pure hypercholesterolemia, unspecified: Secondary | ICD-10-CM | POA: Diagnosis not present

## 2020-04-08 DIAGNOSIS — S8292XA Unspecified fracture of left lower leg, initial encounter for closed fracture: Secondary | ICD-10-CM | POA: Diagnosis not present

## 2020-04-08 DIAGNOSIS — E119 Type 2 diabetes mellitus without complications: Secondary | ICD-10-CM | POA: Diagnosis not present

## 2020-04-08 DIAGNOSIS — S82842D Displaced bimalleolar fracture of left lower leg, subsequent encounter for closed fracture with routine healing: Secondary | ICD-10-CM | POA: Diagnosis not present

## 2020-04-08 DIAGNOSIS — K219 Gastro-esophageal reflux disease without esophagitis: Secondary | ICD-10-CM | POA: Diagnosis not present

## 2020-04-08 DIAGNOSIS — Z4789 Encounter for other orthopedic aftercare: Secondary | ICD-10-CM | POA: Diagnosis not present

## 2020-04-08 DIAGNOSIS — E669 Obesity, unspecified: Secondary | ICD-10-CM | POA: Diagnosis not present

## 2020-04-08 DIAGNOSIS — I4891 Unspecified atrial fibrillation: Secondary | ICD-10-CM | POA: Diagnosis not present

## 2020-04-08 DIAGNOSIS — I10 Essential (primary) hypertension: Secondary | ICD-10-CM | POA: Diagnosis not present

## 2020-04-15 DIAGNOSIS — K219 Gastro-esophageal reflux disease without esophagitis: Secondary | ICD-10-CM | POA: Diagnosis not present

## 2020-04-15 DIAGNOSIS — D649 Anemia, unspecified: Secondary | ICD-10-CM | POA: Diagnosis not present

## 2020-04-15 DIAGNOSIS — M1711 Unilateral primary osteoarthritis, right knee: Secondary | ICD-10-CM | POA: Diagnosis not present

## 2020-04-15 DIAGNOSIS — E119 Type 2 diabetes mellitus without complications: Secondary | ICD-10-CM | POA: Diagnosis not present

## 2020-04-15 DIAGNOSIS — E669 Obesity, unspecified: Secondary | ICD-10-CM | POA: Diagnosis not present

## 2020-04-15 DIAGNOSIS — S82842D Displaced bimalleolar fracture of left lower leg, subsequent encounter for closed fracture with routine healing: Secondary | ICD-10-CM | POA: Diagnosis not present

## 2020-04-15 DIAGNOSIS — E7800 Pure hypercholesterolemia, unspecified: Secondary | ICD-10-CM | POA: Diagnosis not present

## 2020-04-15 DIAGNOSIS — I10 Essential (primary) hypertension: Secondary | ICD-10-CM | POA: Diagnosis not present

## 2020-04-15 DIAGNOSIS — I4891 Unspecified atrial fibrillation: Secondary | ICD-10-CM | POA: Diagnosis not present

## 2020-04-24 DIAGNOSIS — K219 Gastro-esophageal reflux disease without esophagitis: Secondary | ICD-10-CM | POA: Diagnosis not present

## 2020-04-24 DIAGNOSIS — M1711 Unilateral primary osteoarthritis, right knee: Secondary | ICD-10-CM | POA: Diagnosis not present

## 2020-04-24 DIAGNOSIS — E7800 Pure hypercholesterolemia, unspecified: Secondary | ICD-10-CM | POA: Diagnosis not present

## 2020-04-24 DIAGNOSIS — I10 Essential (primary) hypertension: Secondary | ICD-10-CM | POA: Diagnosis not present

## 2020-04-24 DIAGNOSIS — D649 Anemia, unspecified: Secondary | ICD-10-CM | POA: Diagnosis not present

## 2020-04-24 DIAGNOSIS — E119 Type 2 diabetes mellitus without complications: Secondary | ICD-10-CM | POA: Diagnosis not present

## 2020-04-24 DIAGNOSIS — S82842D Displaced bimalleolar fracture of left lower leg, subsequent encounter for closed fracture with routine healing: Secondary | ICD-10-CM | POA: Diagnosis not present

## 2020-04-24 DIAGNOSIS — E669 Obesity, unspecified: Secondary | ICD-10-CM | POA: Diagnosis not present

## 2020-04-24 DIAGNOSIS — I4891 Unspecified atrial fibrillation: Secondary | ICD-10-CM | POA: Diagnosis not present

## 2020-04-29 DIAGNOSIS — I4891 Unspecified atrial fibrillation: Secondary | ICD-10-CM | POA: Diagnosis not present

## 2020-04-29 DIAGNOSIS — D649 Anemia, unspecified: Secondary | ICD-10-CM | POA: Diagnosis not present

## 2020-04-29 DIAGNOSIS — M1711 Unilateral primary osteoarthritis, right knee: Secondary | ICD-10-CM | POA: Diagnosis not present

## 2020-04-29 DIAGNOSIS — I10 Essential (primary) hypertension: Secondary | ICD-10-CM | POA: Diagnosis not present

## 2020-04-29 DIAGNOSIS — E119 Type 2 diabetes mellitus without complications: Secondary | ICD-10-CM | POA: Diagnosis not present

## 2020-04-29 DIAGNOSIS — S82842D Displaced bimalleolar fracture of left lower leg, subsequent encounter for closed fracture with routine healing: Secondary | ICD-10-CM | POA: Diagnosis not present

## 2020-04-29 DIAGNOSIS — E669 Obesity, unspecified: Secondary | ICD-10-CM | POA: Diagnosis not present

## 2020-04-29 DIAGNOSIS — E7800 Pure hypercholesterolemia, unspecified: Secondary | ICD-10-CM | POA: Diagnosis not present

## 2020-04-29 DIAGNOSIS — K219 Gastro-esophageal reflux disease without esophagitis: Secondary | ICD-10-CM | POA: Diagnosis not present

## 2020-05-04 DIAGNOSIS — I10 Essential (primary) hypertension: Secondary | ICD-10-CM | POA: Diagnosis not present

## 2020-05-04 DIAGNOSIS — Z0000 Encounter for general adult medical examination without abnormal findings: Secondary | ICD-10-CM | POA: Diagnosis not present

## 2020-05-04 DIAGNOSIS — E1121 Type 2 diabetes mellitus with diabetic nephropathy: Secondary | ICD-10-CM | POA: Diagnosis not present

## 2020-05-04 DIAGNOSIS — D6859 Other primary thrombophilia: Secondary | ICD-10-CM | POA: Diagnosis not present

## 2020-05-04 DIAGNOSIS — E782 Mixed hyperlipidemia: Secondary | ICD-10-CM | POA: Diagnosis not present

## 2020-05-04 DIAGNOSIS — E1165 Type 2 diabetes mellitus with hyperglycemia: Secondary | ICD-10-CM | POA: Diagnosis not present

## 2020-05-04 DIAGNOSIS — M069 Rheumatoid arthritis, unspecified: Secondary | ICD-10-CM | POA: Diagnosis not present

## 2020-05-04 DIAGNOSIS — I4891 Unspecified atrial fibrillation: Secondary | ICD-10-CM | POA: Diagnosis not present

## 2020-05-04 DIAGNOSIS — Z79899 Other long term (current) drug therapy: Secondary | ICD-10-CM | POA: Diagnosis not present

## 2020-05-05 DIAGNOSIS — I10 Essential (primary) hypertension: Secondary | ICD-10-CM | POA: Diagnosis not present

## 2020-05-05 DIAGNOSIS — E785 Hyperlipidemia, unspecified: Secondary | ICD-10-CM | POA: Diagnosis not present

## 2020-05-06 DIAGNOSIS — K219 Gastro-esophageal reflux disease without esophagitis: Secondary | ICD-10-CM | POA: Diagnosis not present

## 2020-05-06 DIAGNOSIS — E119 Type 2 diabetes mellitus without complications: Secondary | ICD-10-CM | POA: Diagnosis not present

## 2020-05-06 DIAGNOSIS — S82842D Displaced bimalleolar fracture of left lower leg, subsequent encounter for closed fracture with routine healing: Secondary | ICD-10-CM | POA: Diagnosis not present

## 2020-05-06 DIAGNOSIS — E7800 Pure hypercholesterolemia, unspecified: Secondary | ICD-10-CM | POA: Diagnosis not present

## 2020-05-06 DIAGNOSIS — M1711 Unilateral primary osteoarthritis, right knee: Secondary | ICD-10-CM | POA: Diagnosis not present

## 2020-05-06 DIAGNOSIS — I10 Essential (primary) hypertension: Secondary | ICD-10-CM | POA: Diagnosis not present

## 2020-05-06 DIAGNOSIS — E669 Obesity, unspecified: Secondary | ICD-10-CM | POA: Diagnosis not present

## 2020-05-06 DIAGNOSIS — D649 Anemia, unspecified: Secondary | ICD-10-CM | POA: Diagnosis not present

## 2020-05-06 DIAGNOSIS — I4891 Unspecified atrial fibrillation: Secondary | ICD-10-CM | POA: Diagnosis not present

## 2020-05-12 DIAGNOSIS — E119 Type 2 diabetes mellitus without complications: Secondary | ICD-10-CM | POA: Diagnosis not present

## 2020-05-12 DIAGNOSIS — K219 Gastro-esophageal reflux disease without esophagitis: Secondary | ICD-10-CM | POA: Diagnosis not present

## 2020-05-12 DIAGNOSIS — K5900 Constipation, unspecified: Secondary | ICD-10-CM | POA: Diagnosis not present

## 2020-05-12 DIAGNOSIS — I10 Essential (primary) hypertension: Secondary | ICD-10-CM | POA: Diagnosis not present

## 2020-05-12 DIAGNOSIS — H353132 Nonexudative age-related macular degeneration, bilateral, intermediate dry stage: Secondary | ICD-10-CM | POA: Diagnosis not present

## 2020-05-12 DIAGNOSIS — D649 Anemia, unspecified: Secondary | ICD-10-CM | POA: Diagnosis not present

## 2020-05-12 DIAGNOSIS — I4891 Unspecified atrial fibrillation: Secondary | ICD-10-CM | POA: Diagnosis not present

## 2020-05-12 DIAGNOSIS — M1711 Unilateral primary osteoarthritis, right knee: Secondary | ICD-10-CM | POA: Diagnosis not present

## 2020-05-12 DIAGNOSIS — E669 Obesity, unspecified: Secondary | ICD-10-CM | POA: Diagnosis not present

## 2020-05-12 DIAGNOSIS — E7800 Pure hypercholesterolemia, unspecified: Secondary | ICD-10-CM | POA: Diagnosis not present

## 2020-05-12 DIAGNOSIS — R300 Dysuria: Secondary | ICD-10-CM | POA: Diagnosis not present

## 2020-05-12 DIAGNOSIS — S82842D Displaced bimalleolar fracture of left lower leg, subsequent encounter for closed fracture with routine healing: Secondary | ICD-10-CM | POA: Diagnosis not present

## 2020-05-12 DIAGNOSIS — H524 Presbyopia: Secondary | ICD-10-CM | POA: Diagnosis not present

## 2020-05-15 DIAGNOSIS — I700 Atherosclerosis of aorta: Secondary | ICD-10-CM | POA: Diagnosis not present

## 2020-05-15 DIAGNOSIS — E278 Other specified disorders of adrenal gland: Secondary | ICD-10-CM | POA: Diagnosis not present

## 2020-05-15 DIAGNOSIS — I708 Atherosclerosis of other arteries: Secondary | ICD-10-CM | POA: Diagnosis not present

## 2020-05-15 DIAGNOSIS — R109 Unspecified abdominal pain: Secondary | ICD-10-CM | POA: Diagnosis not present

## 2020-05-15 DIAGNOSIS — M545 Low back pain: Secondary | ICD-10-CM | POA: Diagnosis not present

## 2020-05-18 DIAGNOSIS — Z7901 Long term (current) use of anticoagulants: Secondary | ICD-10-CM | POA: Diagnosis not present

## 2020-05-18 DIAGNOSIS — D6859 Other primary thrombophilia: Secondary | ICD-10-CM | POA: Diagnosis not present

## 2020-05-19 DIAGNOSIS — S82842D Displaced bimalleolar fracture of left lower leg, subsequent encounter for closed fracture with routine healing: Secondary | ICD-10-CM | POA: Diagnosis not present

## 2020-05-19 DIAGNOSIS — M545 Low back pain: Secondary | ICD-10-CM | POA: Diagnosis not present

## 2020-05-19 DIAGNOSIS — Z4789 Encounter for other orthopedic aftercare: Secondary | ICD-10-CM | POA: Diagnosis not present

## 2020-05-20 ENCOUNTER — Ambulatory Visit: Payer: Medicare PPO | Admitting: Family Medicine

## 2020-05-20 DIAGNOSIS — K219 Gastro-esophageal reflux disease without esophagitis: Secondary | ICD-10-CM | POA: Diagnosis not present

## 2020-05-20 DIAGNOSIS — I4891 Unspecified atrial fibrillation: Secondary | ICD-10-CM | POA: Diagnosis not present

## 2020-05-20 DIAGNOSIS — M1711 Unilateral primary osteoarthritis, right knee: Secondary | ICD-10-CM | POA: Diagnosis not present

## 2020-05-20 DIAGNOSIS — S82842D Displaced bimalleolar fracture of left lower leg, subsequent encounter for closed fracture with routine healing: Secondary | ICD-10-CM | POA: Diagnosis not present

## 2020-05-20 DIAGNOSIS — E669 Obesity, unspecified: Secondary | ICD-10-CM | POA: Diagnosis not present

## 2020-05-20 DIAGNOSIS — E7800 Pure hypercholesterolemia, unspecified: Secondary | ICD-10-CM | POA: Diagnosis not present

## 2020-05-20 DIAGNOSIS — E119 Type 2 diabetes mellitus without complications: Secondary | ICD-10-CM | POA: Diagnosis not present

## 2020-05-20 DIAGNOSIS — D649 Anemia, unspecified: Secondary | ICD-10-CM | POA: Diagnosis not present

## 2020-05-20 DIAGNOSIS — I10 Essential (primary) hypertension: Secondary | ICD-10-CM | POA: Diagnosis not present

## 2020-05-22 DIAGNOSIS — I10 Essential (primary) hypertension: Secondary | ICD-10-CM | POA: Diagnosis not present

## 2020-05-22 DIAGNOSIS — M1711 Unilateral primary osteoarthritis, right knee: Secondary | ICD-10-CM | POA: Diagnosis not present

## 2020-05-22 DIAGNOSIS — E119 Type 2 diabetes mellitus without complications: Secondary | ICD-10-CM | POA: Diagnosis not present

## 2020-05-22 DIAGNOSIS — S82842D Displaced bimalleolar fracture of left lower leg, subsequent encounter for closed fracture with routine healing: Secondary | ICD-10-CM | POA: Diagnosis not present

## 2020-05-22 DIAGNOSIS — E7800 Pure hypercholesterolemia, unspecified: Secondary | ICD-10-CM | POA: Diagnosis not present

## 2020-05-22 DIAGNOSIS — E669 Obesity, unspecified: Secondary | ICD-10-CM | POA: Diagnosis not present

## 2020-05-22 DIAGNOSIS — I4891 Unspecified atrial fibrillation: Secondary | ICD-10-CM | POA: Diagnosis not present

## 2020-05-22 DIAGNOSIS — D649 Anemia, unspecified: Secondary | ICD-10-CM | POA: Diagnosis not present

## 2020-05-22 DIAGNOSIS — K219 Gastro-esophageal reflux disease without esophagitis: Secondary | ICD-10-CM | POA: Diagnosis not present

## 2020-05-26 ENCOUNTER — Other Ambulatory Visit: Payer: Self-pay

## 2020-05-26 ENCOUNTER — Ambulatory Visit (INDEPENDENT_AMBULATORY_CARE_PROVIDER_SITE_OTHER): Payer: Medicare PPO | Admitting: Family Medicine

## 2020-05-26 ENCOUNTER — Encounter: Payer: Self-pay | Admitting: Family Medicine

## 2020-05-26 VITALS — BP 160/68 | HR 70 | Temp 97.2°F | Ht 62.5 in | Wt 173.6 lb

## 2020-05-26 DIAGNOSIS — K219 Gastro-esophageal reflux disease without esophagitis: Secondary | ICD-10-CM | POA: Diagnosis not present

## 2020-05-26 DIAGNOSIS — E7800 Pure hypercholesterolemia, unspecified: Secondary | ICD-10-CM | POA: Diagnosis not present

## 2020-05-26 DIAGNOSIS — I5032 Chronic diastolic (congestive) heart failure: Secondary | ICD-10-CM | POA: Diagnosis not present

## 2020-05-26 DIAGNOSIS — I480 Paroxysmal atrial fibrillation: Secondary | ICD-10-CM | POA: Diagnosis not present

## 2020-05-26 DIAGNOSIS — Z86711 Personal history of pulmonary embolism: Secondary | ICD-10-CM | POA: Diagnosis not present

## 2020-05-26 DIAGNOSIS — S82842D Displaced bimalleolar fracture of left lower leg, subsequent encounter for closed fracture with routine healing: Secondary | ICD-10-CM | POA: Diagnosis not present

## 2020-05-26 DIAGNOSIS — J45909 Unspecified asthma, uncomplicated: Secondary | ICD-10-CM

## 2020-05-26 DIAGNOSIS — E871 Hypo-osmolality and hyponatremia: Secondary | ICD-10-CM

## 2020-05-26 DIAGNOSIS — E669 Obesity, unspecified: Secondary | ICD-10-CM | POA: Diagnosis not present

## 2020-05-26 DIAGNOSIS — E119 Type 2 diabetes mellitus without complications: Secondary | ICD-10-CM | POA: Diagnosis not present

## 2020-05-26 DIAGNOSIS — Z23 Encounter for immunization: Secondary | ICD-10-CM

## 2020-05-26 DIAGNOSIS — I10 Essential (primary) hypertension: Secondary | ICD-10-CM | POA: Diagnosis not present

## 2020-05-26 DIAGNOSIS — D6859 Other primary thrombophilia: Secondary | ICD-10-CM | POA: Diagnosis not present

## 2020-05-26 DIAGNOSIS — D649 Anemia, unspecified: Secondary | ICD-10-CM | POA: Diagnosis not present

## 2020-05-26 DIAGNOSIS — I4891 Unspecified atrial fibrillation: Secondary | ICD-10-CM | POA: Diagnosis not present

## 2020-05-26 DIAGNOSIS — M1711 Unilateral primary osteoarthritis, right knee: Secondary | ICD-10-CM | POA: Diagnosis not present

## 2020-05-26 MED ORDER — CARVEDILOL 25 MG PO TABS: 25.0000 mg | ORAL_TABLET | Freq: Two times a day (BID) | ORAL | 3 refills | Status: DC

## 2020-05-26 NOTE — Patient Instructions (Addendum)
Increase carvedilol to 25 mg once twice a day.

## 2020-05-26 NOTE — Progress Notes (Signed)
Subjective:  Patient ID: Pamela [name], female    DOB: 1946-07-31  Age: 74 y.o. MRN: [id number] CC: Diabetes, hypertension. Establish care:  HPI The patient presents with history of (type 2) diabetes mellitus complicated by hyperlipidemia.. Patient was diagnosed in 74. She is on dulaglutide 3 mg weekly, Toujeo 17 U daily, Compliance with treatment has been good; the patient takes medication as directed , maintains a diabetic diet and an exercise regimen , follows up as directed , and is keeping a glucose diary. Sugars runs 96-165.Marland Kitchen Patient specifically denies associated symptoms, including blurred vision, fatigue, polydipsia, polyphagia and polyuria . Patient denies hypoglycemia. In regard to preventative care, the patient performs foot self-exams daily and last ophthalmology exam was in 05/2020.  Pt presents for follow up of hypertension. Patient was diagnosed in 56s. Bps have been high for the last 2 months 160-180s/68-100. Patient is currently on carvedilol 12.5 mg one twice a day, , furosemide 20 mg one twice a day, and lisinopril 2.5 mg daily. The patient is tolerating the medication well without side effects. Compliance with treatment has been good; including taking medication as directed , maintains a healthy diet and regular exercise regimen , and following up as directed.  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 Her mother had antithrombin 3 deficiency,  Elevated Cholesterol: Currently on lipitor, Eats healthy. Exercises.   Patient has hyponatremia. Thorough work up by Dr. Loanne Drilling has been normal.   Arthritis of hands, feet, shoulders, and back. Pt sees Dr. Trudie Reed. She has no RA or SLE.   Bladder incontinence: Taking detrol LA 2 mg one at bedtime which helps.   Patient has asthma. Patient takes duoneb prn. She has a history of PE and is  followed by Dr. Melvyn Novas.    Current Outpatient Medications on File Prior to Visit  Medication Sig Dispense Refill  . Dulaglutide (TRULICITY) 3 TK/2.4OX SOPN Inject 3 mg into the skin once a week.    . methocarbamol (ROBAXIN) 500 MG tablet Take 500 mg by mouth every 8 (eight) hours as needed for muscle spasms.    Marland Kitchen acetaminophen (TYLENOL) 500 MG tablet Take 500-1,000 mg by mouth every 6 (six) hours as needed (for pain.).    Marland Kitchen albuterol (VENTOLIN HFA) 108 (90 Base) MCG/ACT inhaler Inhale 1-2 puffs into the lungs every 6 (six) hours as needed for wheezing or shortness of breath.     Marland Kitchen azelastine (ASTELIN) 137 MCG/SPRAY nasal spray Place 2 sprays into the nose daily as needed for rhinitis or allergies.     . B Complex-C (B-COMPLEX WITH VITAMIN C) tablet Take 1 tablet by mouth daily with lunch.    . Cholecalciferol (VITAMIN D3) 50 MCG (2000 UT) TABS Take 2,000 Units by mouth daily with lunch.     . denosumab (PROLIA) 60 MG/ML SOSY injection Inject 60 mg into the skin every 6 (six) months.    . ferrous sulfate 325 (65 FE) MG tablet Take 325 mg by mouth daily.    . Insulin Glargine, 1 Unit Dial, (TOUJEO SOLOSTAR) 300 UNIT/ML SOPN Inject 17 Units into the skin daily.     Marland Kitchen ipratropium-albuterol (DUONEB) 0.5-2.5 (3) MG/3ML SOLN Take 3 mLs by nebulization 4 (four) times daily as needed (wheezing/shortness of breath).   0  . lisinopril (ZESTRIL) 2.5 MG tablet Take 2.5 mg by mouth daily.     Marland Kitchen  Menthol-Methyl Salicylate (SALONPAS PAIN RELIEF PATCH) PTCH Apply 1 patch topically daily as needed (pain.).    Marland Kitchen ondansetron (ZOFRAN ODT) 4 MG disintegrating tablet Take 1 tablet (4 mg total) by mouth every 8 (eight) hours as needed. 20 tablet 0  . ondansetron (ZOFRAN-ODT) 8 MG disintegrating tablet Take 8 mg by mouth every 8 (eight) hours as needed for nausea or vomiting.     Marland Kitchen oxyCODONE (OXY IR/ROXICODONE) 5 MG immediate release tablet Take 1 tablet (5 mg total) by mouth every 6 (six) hours as needed for severe pain.  20 tablet 0  . pantoprazole (PROTONIX) 40 MG tablet Take 1 tablet (40 mg total) by mouth daily. Take 30-60 min before first meal of the day 30 tablet 2  . polyethylene glycol (MIRALAX / GLYCOLAX) 17 g packet Take 17 g by mouth as needed.     . senna-docusate (SENOKOT S) 8.6-50 MG tablet Take 1 tablet by mouth at bedtime.    . sodium chloride (OCEAN) 0.65 % SOLN nasal spray Place 1-2 sprays into both nostrils 4 (four) times daily as needed for congestion.    Marland Kitchen tolterodine (DETROL LA) 2 MG 24 hr capsule Take 2 mg by mouth at bedtime.     . traMADol (ULTRAM) 50 MG tablet Take 1 tablet (50 mg total) by mouth every 6 (six) hours as needed for moderate pain. 30 tablet 0  . warfarin (COUMADIN) 1 MG tablet Take 0.5 mg by mouth See admin instructions. Take 0.5 tablet (0.5 mg) with a 5 mg tablet on Sundays, Tuesdays, Thursdays, Saturdays.    Marland Kitchen warfarin (COUMADIN) 5 MG tablet Take 5 mg by mouth daily.     No current facility-administered medications on file prior to visit.   Past Medical History:  Diagnosis Date  . Anemia   . Asthma   . Atrial fibrillation [I48.91] 10/01/2014  . Bilateral leg cramps   . Chronic anticoagulation   . Chronic constipation   . Closed bimalleolar fracture of left ankle 02/26/2020  . Complication of anesthesia   . Deep venous thrombosis ([place]) 07/30/2013  . Diabetes mellitus   . Dizziness 05/23/2011  . DJD (degenerative joint disease)   . DOE (dyspnea on exertion) 03/03/2013   Onset early 2020  - 02/05/2019   Walked RA  2 laps @  approx 252ft each @ nl pace  stopped due to  Light headed, no sp or sob, no desats    . DVT (deep venous thrombosis) ([place]) 07/30/2013  . Dyspnea    occasionally  . Dyspnea on exertion 03/03/2013   Onset early 2020  - 02/05/2019   Walked RA  2 laps @  approx 235ft each @ nl pace  stopped due to  Light headed, no sp or sob, no desats    . Encounter for orthopedic follow-up care 03/10/2020  . GERD (gastroesophageal reflux disease)   . Heart murmur    as  a child  . HTN (hypertension)   . Hypercholesteremia   . Hypertension 05/23/2011  . Hyponatremia 02/06/2019   Na 126 on no obvious meds to cause it with last na 133 in 2014   . Long term (current) use of anticoagulants 03/03/2013  . Low back pain 07/06/2018  . Low back strain 08/09/2018  . Obesity   . Osteoarthritis of right knee 07/06/2018  . Pain in right knee 07/06/2018  . Pneumonia   . PONV (postoperative nausea and vomiting)    needs pre-medication  . Protein S deficiency ([place])  prior DVT L knee, L arm DVT,  followed by Dr West Pugh at [facility]  . PVC's (premature ventricular contractions)   . S/P cardiac catheterization 02/2013   Normal coronaries; low normal EF at 50%  . Sleep apnea    can't use the cpap (diagnosed 2017)  . Solitary pulmonary nodule on lung CT 02/06/2019   First noted 01/13/2014 > no change as of 12/21/2018  . Spondylolisthesis, lumbar region 08/09/2018  . Stroke [facility])    TIA in 2018 or 2019  . TIA (transient ischemic attack)   . Transient ischemic attack 07/30/2013  . Ventricular premature beats 05/23/2011   Past Surgical History:  Procedure Laterality Date  . CARPAL TUNNEL RELEASE  1994   bilateral  . CHOLECYSTECTOMY  1993  . COLONOSCOPY    . FOOT TENDON SURGERY Right   . left knee replacement  2006  . ORIF ANKLE FRACTURE Left 02/26/2020   Procedure: OPEN REDUCTION INTERNAL FIXATION (ORIF) ANKLE FRACTURE;  Surgeon: Nicholes Stairs, MD;  Location: [facility];  Service: Orthopedics;  Laterality: Left;  90 mins  . thumb surgery Left 2018  . TONSILLECTOMY    . TOTAL ABDOMINAL HYSTERECTOMY  1988    Family History  Problem Relation Age of Onset  . Cirrhosis Mother   . Antithrombin III deficiency Mother        multiple emboli  . Ulcerative colitis Mother   . Coronary artery disease Father   . Heart attack Father   . Hypertension Father   . Protein S deficiency Sister        prior DVT  . Protein S deficiency Daughter   . Hypertension Sister   . Hypertension  Brother   . Lung cancer Brother   . Heart attack Brother   . Heart attack Sister        age 26   . Stroke Neg Hx   . Colitis Neg Hx   . Colon polyps Neg Hx   . Esophageal cancer Neg Hx   . Liver cancer Neg Hx   . Pancreatic cancer Neg Hx   . Rectal cancer Neg Hx   . Stomach cancer Neg Hx    Social History   Socioeconomic History  . Marital status: Married    Spouse name: [name]  . Number of children: Not on file  . Years of education: Not on file  . Highest education level: Not on file  Occupational History  . Not on file  Tobacco Use  . Smoking status: Never Smoker  . Smokeless tobacco: Never Used  Vaping Use  . Vaping Use: Never used  Substance and Sexual Activity  . Alcohol use: No  . Drug use: No  . Sexual activity: Not on file  Other Topics Concern  . Not on file  Social History Narrative   Lives with spouse in [place].  2 grown daughters.   Retired Glass blower/designer     Social Determinants of Radio broadcast assistant Strain:   . Difficulty of Paying Living Expenses: Not on file  Food Insecurity:   . Worried About Charity fundraiser in the Last Year: Not on file  . Ran Out of Food in the Last Year: Not on file  Transportation Needs:   . Lack of Transportation (Medical): Not on file  . Lack of Transportation (Non-Medical): Not on file  Physical Activity:   . Days of Exercise per Week: Not on file  . Minutes of Exercise per Session: Not on  file  Stress:   . Feeling of Stress : Not on file  Social Connections:   . Frequency of Communication with Friends and Family: Not on file  . Frequency of Social Gatherings with Friends and Family: Not on file  . Attends Religious Services: Not on file  . Active Member of Clubs or Organizations: Not on file  . Attends Archivist Meetings: Not on file  . Marital Status: Not on file    Review of Systems  Constitutional: Positive for chills. Negative for fatigue and fever.  HENT: Negative for congestion,  ear pain and sore throat.   Respiratory: Positive for shortness of breath (on exertion). Negative for cough.   Cardiovascular: Negative for chest pain.  Gastrointestinal: Negative for abdominal pain, constipation, diarrhea, nausea and vomiting.  Endocrine: Negative for polydipsia, polyphagia and polyuria.  Genitourinary: Negative for dysuria and urgency.  Musculoskeletal: Negative for arthralgias and myalgias.  Skin: Negative for rash.  Neurological: Negative for dizziness and headaches.  Psychiatric/Behavioral: Negative for dysphoric mood. The patient is not nervous/anxious.     Objective:  BP (!) 160/68   Pulse 70   Temp (!) 97.2 F (36.2 C)   Ht 5' 2.5" (1.588 m)   Wt 173 lb 9.6 oz (78.7 kg)   BMI 31.25 kg/m   BP/Weight 06/05/2020 0/93/8182 05/15/3715  Systolic BP 967 893 810  Diastolic BP 68 68 72  Wt. (Lbs) 175.4 173.6 177.2  BMI 31.57 31.25 29.95   Physical Exam Vitals reviewed.  Constitutional:      Appearance: Normal appearance. She is obese.  Neck:     Vascular: No carotid bruit.  Cardiovascular:     Rate and Rhythm: Normal rate and regular rhythm.     Pulses: Normal pulses.     Heart sounds: Normal heart sounds.  Pulmonary:     Effort: Pulmonary effort is normal. No respiratory distress.     Breath sounds: Normal breath sounds.  Abdominal:     General: Abdomen is flat. Bowel sounds are normal.     Palpations: Abdomen is soft.     Tenderness: There is no abdominal tenderness.  Neurological:     Mental Status: She is alert and oriented to person, place, and time.  Psychiatric:        Mood and Affect: Mood normal.        Behavior: Behavior normal.     Diabetic Foot Exam - Simple   No data filed       Lab Results  Component Value Date   WBC 6.6 05/26/2020   HGB 13.2 05/26/2020   HCT 40.4 05/26/2020   PLT 301 05/26/2020   GLUCOSE 119 (H) 05/26/2020   TRIG 74.0 03/10/2020   ALT 31 05/26/2020   AST 27 05/26/2020   NA 138 05/26/2020   K 4.4  05/26/2020   CL 100 05/26/2020   CREATININE 0.78 05/26/2020   BUN 13 05/26/2020   CO2 27 05/26/2020   TSH 2.020 11/14/2019   INR 1.4 (H) 02/28/2020   HGBA1C 7.4 (H) 02/26/2020      Assessment & Plan:  1. Essential hypertension, benign Poorly controlled.  Increase carvedilol to 25 mg once twice a day.  Continue to work on eating a healthy diet and exercise.  Labs drawn today.  - CBC with Differential/Platelet - Comprehensive metabolic panel  2. Needs flu shot - Flu Vaccine QUAD High Dose(Fluad)  3. Moderate asthma without complication, unspecified whether persistent The current medical regimen is effective;  continue  present plan and medications.  4. Paroxysmal atrial fibrillation (HCC) The current medical regimen is effective;  continue present plan and medications.  5. History of pulmonary embolism The current medical regimen is effective;  continue present plan and medications. Continue warfarin.  6. Protein S deficiency (HCC) Continue warfarin.  7. Hyponatremia - CMP  8. Chronic diastolic congestive heart failure (HCC) The current medical regimen is effective;  continue present plan and medications.  Meds ordered this encounter  Medications  . DISCONTD: carvedilol (COREG) 25 MG tablet    Sig: Take 1 tablet (25 mg total) by mouth 2 (two) times daily with a meal.    Dispense:  60 tablet    Refill:  3    Orders Placed This Encounter  Procedures  . Flu Vaccine QUAD High Dose(Fluad)  . CBC with Differential/Platelet  . Comprehensive metabolic panel     Follow-up: Return in about 2 months (around 08/05/2020) for fasting visit.Marland Kitchen  An After Visit Summary was printed and given to the patient.  [name] [facility] [phone]

## 2020-05-27 ENCOUNTER — Other Ambulatory Visit: Payer: Self-pay | Admitting: Family Medicine

## 2020-05-27 DIAGNOSIS — I4811 Longstanding persistent atrial fibrillation: Secondary | ICD-10-CM

## 2020-05-27 DIAGNOSIS — I10 Essential (primary) hypertension: Secondary | ICD-10-CM

## 2020-05-27 LAB — COMPREHENSIVE METABOLIC PANEL
ALT: 31 IU/L (ref 0–32)
AST: 27 IU/L (ref 0–40)
Albumin/Globulin Ratio: 1.5 (ref 1.2–2.2)
Albumin: 4.1 g/dL (ref 3.7–4.7)
Alkaline Phosphatase: 88 IU/L (ref 44–121)
BUN/Creatinine Ratio: 17 (ref 12–28)
BUN: 13 mg/dL (ref 8–27)
Bilirubin Total: 0.4 mg/dL (ref 0.0–1.2)
CO2: 27 mmol/L (ref 20–29)
Calcium: 8.7 mg/dL (ref 8.7–10.3)
Chloride: 100 mmol/L (ref 96–106)
Creatinine, Ser: 0.78 mg/dL (ref 0.57–1.00)
GFR calc Af Amer: 86 mL/min/{1.73_m2} (ref >59)
GFR calc non Af Amer: 75 mL/min/{1.73_m2} (ref >59)
Globulin, Total: 2.7 g/dL (ref 1.5–4.5)
Glucose: 119 mg/dL — ABNORMAL HIGH (ref 65–99)
Potassium: 4.4 mmol/L (ref 3.5–5.2)
Sodium: 138 mmol/L (ref 134–144)
Total Protein: 6.8 g/dL (ref 6.0–8.5)

## 2020-05-27 LAB — CBC WITH DIFFERENTIAL/PLATELET
Basophils Absolute: 0.1 10*3/uL (ref 0.0–0.2)
Basos: 1 %
EOS (ABSOLUTE): 0.5 10*3/uL — ABNORMAL HIGH (ref 0.0–0.4)
Eos: 7 %
Hematocrit: 40.4 % (ref 34.0–46.6)
Hemoglobin: 13.2 g/dL (ref 11.1–15.9)
Immature Grans (Abs): 0 10*3/uL (ref 0.0–0.1)
Immature Granulocytes: 0 %
Lymphocytes Absolute: 1.3 10*3/uL (ref 0.7–3.1)
Lymphs: 20 %
MCH: 30.8 pg (ref 26.6–33.0)
MCHC: 32.7 g/dL (ref 31.5–35.7)
MCV: 94 fL (ref 79–97)
Monocytes Absolute: 0.8 10*3/uL (ref 0.1–0.9)
Monocytes: 11 %
Neutrophils Absolute: 4 10*3/uL (ref 1.4–7.0)
Neutrophils: 61 %
Platelets: 301 10*3/uL (ref 150–450)
RBC: 4.29 x10E6/uL (ref 3.77–5.28)
RDW: 12.9 % (ref 11.7–15.4)
WBC: 6.6 10*3/uL (ref 3.4–10.8)

## 2020-05-29 DIAGNOSIS — S82842D Displaced bimalleolar fracture of left lower leg, subsequent encounter for closed fracture with routine healing: Secondary | ICD-10-CM | POA: Diagnosis not present

## 2020-05-29 DIAGNOSIS — I4891 Unspecified atrial fibrillation: Secondary | ICD-10-CM | POA: Diagnosis not present

## 2020-05-29 DIAGNOSIS — M1711 Unilateral primary osteoarthritis, right knee: Secondary | ICD-10-CM | POA: Diagnosis not present

## 2020-05-29 DIAGNOSIS — K219 Gastro-esophageal reflux disease without esophagitis: Secondary | ICD-10-CM | POA: Diagnosis not present

## 2020-05-29 DIAGNOSIS — I10 Essential (primary) hypertension: Secondary | ICD-10-CM | POA: Diagnosis not present

## 2020-05-29 DIAGNOSIS — E669 Obesity, unspecified: Secondary | ICD-10-CM | POA: Diagnosis not present

## 2020-05-29 DIAGNOSIS — E7800 Pure hypercholesterolemia, unspecified: Secondary | ICD-10-CM | POA: Diagnosis not present

## 2020-05-29 DIAGNOSIS — E119 Type 2 diabetes mellitus without complications: Secondary | ICD-10-CM | POA: Diagnosis not present

## 2020-05-29 DIAGNOSIS — D649 Anemia, unspecified: Secondary | ICD-10-CM | POA: Diagnosis not present

## 2020-06-01 ENCOUNTER — Other Ambulatory Visit: Payer: Self-pay

## 2020-06-01 MED ORDER — MAGNESIUM OXIDE -MG SUPPLEMENT 400 (240 MG) MG PO TABS: 800.0000 mg | ORAL_TABLET | Freq: Two times a day (BID) | ORAL | 2 refills | Status: DC

## 2020-06-03 DIAGNOSIS — E7800 Pure hypercholesterolemia, unspecified: Secondary | ICD-10-CM | POA: Diagnosis not present

## 2020-06-03 DIAGNOSIS — K219 Gastro-esophageal reflux disease without esophagitis: Secondary | ICD-10-CM | POA: Diagnosis not present

## 2020-06-03 DIAGNOSIS — I4891 Unspecified atrial fibrillation: Secondary | ICD-10-CM | POA: Diagnosis not present

## 2020-06-03 DIAGNOSIS — E119 Type 2 diabetes mellitus without complications: Secondary | ICD-10-CM | POA: Diagnosis not present

## 2020-06-03 DIAGNOSIS — E669 Obesity, unspecified: Secondary | ICD-10-CM | POA: Diagnosis not present

## 2020-06-03 DIAGNOSIS — S82842D Displaced bimalleolar fracture of left lower leg, subsequent encounter for closed fracture with routine healing: Secondary | ICD-10-CM | POA: Diagnosis not present

## 2020-06-03 DIAGNOSIS — I10 Essential (primary) hypertension: Secondary | ICD-10-CM | POA: Diagnosis not present

## 2020-06-03 DIAGNOSIS — M1711 Unilateral primary osteoarthritis, right knee: Secondary | ICD-10-CM | POA: Diagnosis not present

## 2020-06-03 DIAGNOSIS — D649 Anemia, unspecified: Secondary | ICD-10-CM | POA: Diagnosis not present

## 2020-06-04 ENCOUNTER — Other Ambulatory Visit: Payer: Self-pay

## 2020-06-04 DIAGNOSIS — I639 Cerebral infarction, unspecified: Secondary | ICD-10-CM | POA: Insufficient documentation

## 2020-06-04 DIAGNOSIS — Z7901 Long term (current) use of anticoagulants: Secondary | ICD-10-CM | POA: Insufficient documentation

## 2020-06-04 DIAGNOSIS — G4733 Obstructive sleep apnea (adult) (pediatric): Secondary | ICD-10-CM | POA: Insufficient documentation

## 2020-06-04 DIAGNOSIS — K5909 Other constipation: Secondary | ICD-10-CM | POA: Insufficient documentation

## 2020-06-04 DIAGNOSIS — K219 Gastro-esophageal reflux disease without esophagitis: Secondary | ICD-10-CM | POA: Insufficient documentation

## 2020-06-04 DIAGNOSIS — I10 Essential (primary) hypertension: Secondary | ICD-10-CM | POA: Diagnosis not present

## 2020-06-04 DIAGNOSIS — E785 Hyperlipidemia, unspecified: Secondary | ICD-10-CM | POA: Diagnosis not present

## 2020-06-04 DIAGNOSIS — G4730 Sleep apnea, unspecified: Secondary | ICD-10-CM | POA: Insufficient documentation

## 2020-06-04 DIAGNOSIS — E7800 Pure hypercholesterolemia, unspecified: Secondary | ICD-10-CM | POA: Insufficient documentation

## 2020-06-04 DIAGNOSIS — T8859XA Other complications of anesthesia, initial encounter: Secondary | ICD-10-CM | POA: Insufficient documentation

## 2020-06-04 DIAGNOSIS — R0600 Dyspnea, unspecified: Secondary | ICD-10-CM | POA: Insufficient documentation

## 2020-06-04 DIAGNOSIS — E669 Obesity, unspecified: Secondary | ICD-10-CM | POA: Insufficient documentation

## 2020-06-04 DIAGNOSIS — J189 Pneumonia, unspecified organism: Secondary | ICD-10-CM | POA: Insufficient documentation

## 2020-06-04 DIAGNOSIS — D649 Anemia, unspecified: Secondary | ICD-10-CM | POA: Insufficient documentation

## 2020-06-04 DIAGNOSIS — R252 Cramp and spasm: Secondary | ICD-10-CM | POA: Insufficient documentation

## 2020-06-04 DIAGNOSIS — M1990 Unspecified osteoarthritis, unspecified site: Secondary | ICD-10-CM | POA: Insufficient documentation

## 2020-06-04 DIAGNOSIS — J45909 Unspecified asthma, uncomplicated: Secondary | ICD-10-CM | POA: Insufficient documentation

## 2020-06-04 DIAGNOSIS — R112 Nausea with vomiting, unspecified: Secondary | ICD-10-CM | POA: Insufficient documentation

## 2020-06-04 DIAGNOSIS — Z9889 Other specified postprocedural states: Secondary | ICD-10-CM | POA: Insufficient documentation

## 2020-06-04 DIAGNOSIS — R011 Cardiac murmur, unspecified: Secondary | ICD-10-CM | POA: Insufficient documentation

## 2020-06-05 ENCOUNTER — Other Ambulatory Visit: Payer: Self-pay

## 2020-06-05 ENCOUNTER — Encounter: Payer: Self-pay | Admitting: Cardiology

## 2020-06-05 ENCOUNTER — Ambulatory Visit (INDEPENDENT_AMBULATORY_CARE_PROVIDER_SITE_OTHER): Payer: Medicare PPO | Admitting: Cardiology

## 2020-06-05 VITALS — BP 110/68 | HR 68 | Ht 62.5 in | Wt 175.4 lb

## 2020-06-05 DIAGNOSIS — N952 Postmenopausal atrophic vaginitis: Secondary | ICD-10-CM | POA: Diagnosis not present

## 2020-06-05 DIAGNOSIS — I493 Ventricular premature depolarization: Secondary | ICD-10-CM

## 2020-06-05 DIAGNOSIS — N390 Urinary tract infection, site not specified: Secondary | ICD-10-CM | POA: Diagnosis not present

## 2020-06-05 DIAGNOSIS — Z7901 Long term (current) use of anticoagulants: Secondary | ICD-10-CM

## 2020-06-05 DIAGNOSIS — N3946 Mixed incontinence: Secondary | ICD-10-CM | POA: Diagnosis not present

## 2020-06-05 DIAGNOSIS — E7800 Pure hypercholesterolemia, unspecified: Secondary | ICD-10-CM | POA: Diagnosis not present

## 2020-06-05 DIAGNOSIS — I10 Essential (primary) hypertension: Secondary | ICD-10-CM

## 2020-06-05 MED ORDER — CARVEDILOL 25 MG PO TABS: 25.0000 mg | ORAL_TABLET | Freq: Two times a day (BID) | ORAL | 3 refills | Status: DC

## 2020-06-05 MED ORDER — ATORVASTATIN CALCIUM 40 MG PO TABS: 40.0000 mg | ORAL_TABLET | Freq: Every day | ORAL | 3 refills | Status: DC

## 2020-06-05 MED ORDER — FUROSEMIDE 20 MG PO TABS: 20.0000 mg | ORAL_TABLET | Freq: Two times a day (BID) | ORAL | 3 refills | Status: DC

## 2020-06-05 MED ORDER — ASPIRIN EC 81 MG PO TBEC: 81.0000 mg | DELAYED_RELEASE_TABLET | Freq: Every day | ORAL | 3 refills | Status: DC

## 2020-06-05 NOTE — Patient Instructions (Addendum)
Medication Instructions:  Increase Lasik to 20 mg twice a daily  Refills sent for Carvedilol, Aspirin, Atorvastatin, and Lasik  *If you need a refill on your cardiac medications before your next appointment, please call your pharmacy*   Lab Work: None If you have labs (blood work) drawn today and your tests are completely normal, you will receive your results only by: Marland Kitchen MyChart Message (if you have MyChart) OR . A paper copy in the mail If you have any lab test that is abnormal or we need to change your treatment, we will call you to review the results.   Testing/Procedures: None   Follow-Up: At [facility], you and your health needs are our priority.  As part of our continuing mission to provide you with exceptional heart care, we have created designated Provider Care Teams.  These Care Teams include your primary Cardiologist (physician) and Advanced Practice Providers (APPs -  Physician Assistants and Nurse Practitioners) who all work together to provide you with the care you need, when you need it.  We recommend signing up for the patient portal called "MyChart".  Sign up information is provided on this After Visit Summary.  MyChart is used to connect with patients for Virtual Visits (Telemedicine).  Patients are able to view lab/test results, encounter notes, upcoming appointments, etc.  Non-urgent messages can be sent to your provider as well.   To learn more about what you can do with MyChart, go to [email or web address].    Your next appointment:   1 month(s)  The format for your next appointment:   In Person  Provider:   Berniece Salines, DO   Other Instructions

## 2020-06-05 NOTE — Progress Notes (Addendum)
Cardiology Office Note:    Date:  06/08/2020   ID:  Pamela [name], DOB 04/10/1946, MRN [id number]  PCP:  [name], MD  Cardiologist:  No primary care provider on file.  Electrophysiologist:  None   Referring MD: Serita Grammes, MD   Follow up visit  History of Present Illness:    Pamela [name] is a 74 y.o. female with a hx of Diabetes Mellitus, GERD, protein S deficiency chronic anticoagulation with Coumadin and has had DVT in the past, hypertension, hypercholesteremia, PVC, Paroxysmal atrial fibrillation history of a left heart catheterization with normal coronaries, history of TIA. She had a ORIF ankle fracture in June and status post open reduction internal fixation.  Since I last saw her she has had some leg swelling. In addition she has switched pcp and now follows with [name].  She has had some fatigue but tells me that she is able to do her everyday chores around the house without any problem.  Past Medical History:  Diagnosis Date  . Anemia   . Asthma   . Atrial fibrillation [I48.91] 10/01/2014  . Bilateral leg cramps   . Chronic anticoagulation   . Chronic constipation   . Closed bimalleolar fracture of left ankle 02/26/2020  . Complication of anesthesia   . Deep venous thrombosis ([place]) 07/30/2013  . Diabetes mellitus   . Dizziness 05/23/2011  . DJD (degenerative joint disease)   . DOE (dyspnea on exertion) 03/03/2013   Onset early 2020  - 02/05/2019   Walked RA  2 laps @  approx 210ft each @ nl pace  stopped due to  Light headed, no sp or sob, no desats    . DVT (deep venous thrombosis) ([place]) 07/30/2013  . Dyspnea    occasionally  . Dyspnea on exertion 03/03/2013   Onset early 2020  - 02/05/2019   Walked RA  2 laps @  approx 269ft each @ nl pace  stopped due to  Light headed, no sp or sob, no desats    . Encounter for orthopedic follow-up care 03/10/2020  . GERD (gastroesophageal reflux disease)   . Heart murmur    as a child  . HTN  (hypertension)   . Hypercholesteremia   . Hypertension 05/23/2011  . Hyponatremia 02/06/2019   Na 126 on no obvious meds to cause it with last na 133 in 2014   . Long term (current) use of anticoagulants 03/03/2013  . Low back pain 07/06/2018  . Low back strain 08/09/2018  . Obesity   . Osteoarthritis of right knee 07/06/2018  . Pain in right knee 07/06/2018  . Pneumonia   . PONV (postoperative nausea and vomiting)    needs pre-medication  . Protein S deficiency ([place])    prior DVT L knee, L arm DVT,  followed by Dr West Pugh at [facility]  . PVC's (premature ventricular contractions)   . S/P cardiac catheterization 02/2013   Normal coronaries; low normal EF at 50%  . Sleep apnea    can't use the cpap (diagnosed 2017)  . Solitary pulmonary nodule on lung CT 02/06/2019   First noted 01/13/2014 > no change as of 12/21/2018  . Spondylolisthesis, lumbar region 08/09/2018  . Stroke [facility])    TIA in 2018 or 2019  . TIA (transient ischemic attack)   . Transient ischemic attack 07/30/2013  . Ventricular premature beats 05/23/2011    Past Surgical History:  Procedure Laterality Date  . [place]  bilateral  . CHOLECYSTECTOMY  1993  . COLONOSCOPY    . FOOT TENDON SURGERY Right   . left knee replacement  2006  . ORIF ANKLE FRACTURE Left 02/26/2020   Procedure: OPEN REDUCTION INTERNAL FIXATION (ORIF) ANKLE FRACTURE;  Surgeon: Nicholes Stairs, MD;  Location: [place];  Service: Orthopedics;  Laterality: Left;  90 mins  . thumb surgery Left 2018  . TONSILLECTOMY    . TOTAL ABDOMINAL HYSTERECTOMY  1988    Current Medications: Current Meds  Medication Sig  . acetaminophen (TYLENOL) 500 MG tablet Take 500-1,000 mg by mouth every 6 (six) hours as needed (for pain.).  Marland Kitchen albuterol (VENTOLIN HFA) 108 (90 Base) MCG/ACT inhaler Inhale 1-2 puffs into the lungs every 6 (six) hours as needed for wheezing or shortness of breath.   Marland Kitchen amLODipine (NORVASC) 2.5 MG tablet   . aspirin EC 81 MG  tablet Take 1 tablet (81 mg total) by mouth daily.  Marland Kitchen atorvastatin (LIPITOR) 40 MG tablet Take 1 tablet (40 mg total) by mouth daily at 12 noon.  Marland Kitchen azelastine (ASTELIN) 137 MCG/SPRAY nasal spray Place 2 sprays into the nose daily as needed for rhinitis or allergies.   . B Complex-C (B-COMPLEX WITH VITAMIN C) tablet Take 1 tablet by mouth daily with lunch.  . carvedilol (COREG) 25 MG tablet Take 1 tablet (25 mg total) by mouth 2 (two) times daily with a meal.  . Cholecalciferol (VITAMIN D3) 50 MCG (2000 UT) TABS Take 2,000 Units by mouth daily with lunch.   . denosumab (PROLIA) 60 MG/ML SOSY injection Inject 60 mg into the skin every 6 (six) months.  . Dulaglutide (TRULICITY) 3 WC/5.8NI SOPN Inject 3 mg into the skin once a week.  . ferrous sulfate 325 (65 FE) MG tablet Take 325 mg by mouth daily.  . furosemide (LASIX) 20 MG tablet Take 1 tablet (20 mg total) by mouth 2 (two) times daily.  . Insulin Glargine, 1 Unit Dial, (TOUJEO SOLOSTAR) 300 UNIT/ML SOPN Inject 17 Units into the skin daily.   Marland Kitchen ipratropium-albuterol (DUONEB) 0.5-2.5 (3) MG/3ML SOLN Take 3 mLs by nebulization 4 (four) times daily as needed (wheezing/shortness of breath).   Marland Kitchen lisinopril (ZESTRIL) 2.5 MG tablet Take 2.5 mg by mouth daily.   . Magnesium Oxide 400 (240 Mg) MG TABS Take 2 tablets (800 mg total) by mouth 2 (two) times daily.  . Menthol-Methyl Salicylate (SALONPAS PAIN RELIEF PATCH) PTCH Apply 1 patch topically daily as needed (pain.).  Marland Kitchen methocarbamol (ROBAXIN) 500 MG tablet Take 500 mg by mouth every 8 (eight) hours as needed for muscle spasms.  . ondansetron (ZOFRAN ODT) 4 MG disintegrating tablet Take 1 tablet (4 mg total) by mouth every 8 (eight) hours as needed.  . ondansetron (ZOFRAN-ODT) 8 MG disintegrating tablet Take 8 mg by mouth every 8 (eight) hours as needed for nausea or vomiting.   Marland Kitchen oxyCODONE (OXY IR/ROXICODONE) 5 MG immediate release tablet Take 1 tablet (5 mg total) by mouth every 6 (six) hours as  needed for severe pain.  . pantoprazole (PROTONIX) 40 MG tablet Take 1 tablet (40 mg total) by mouth daily. Take 30-60 min before first meal of the day  . polyethylene glycol (MIRALAX / GLYCOLAX) 17 g packet Take 17 g by mouth as needed.   . senna-docusate (SENOKOT S) 8.6-50 MG tablet Take 1 tablet by mouth at bedtime.  . sodium chloride (OCEAN) 0.65 % SOLN nasal spray Place 1-2 sprays into both nostrils 4 (four) times daily as  needed for congestion.  Marland Kitchen tolterodine (DETROL LA) 2 MG 24 hr capsule Take 2 mg by mouth at bedtime.   . traMADol (ULTRAM) 50 MG tablet Take 1 tablet (50 mg total) by mouth every 6 (six) hours as needed for moderate pain.  Marland Kitchen warfarin (COUMADIN) 1 MG tablet Take 0.5 mg by mouth See admin instructions. Take 0.5 tablet (0.5 mg) with a 5 mg tablet on Sundays, Tuesdays, Thursdays, Saturdays.  Marland Kitchen warfarin (COUMADIN) 5 MG tablet Take 5 mg by mouth daily.  . [DISCONTINUED] aspirin EC 81 MG tablet Take 81 mg by mouth daily.  . [DISCONTINUED] atorvastatin (LIPITOR) 40 MG tablet Take 40 mg by mouth daily at 12 noon.   . [DISCONTINUED] carvedilol (COREG) 25 MG tablet Take 1 tablet (25 mg total) by mouth 2 (two) times daily with a meal.  . [DISCONTINUED] furosemide (LASIX) 20 MG tablet Take 20 mg by mouth 2 (two) times daily.     Allergies:   Ketek [telithromycin], Loxapine succinate, Naproxen sodium, Amoxapine and related, Darvon, Belsomra [suvorexant], Cymbalta [duloxetine hcl], Amoxicillin, and Propoxyphene   Social History   Socioeconomic History  . Marital status: Married    Spouse name: [name]  . Number of children: Not on file  . Years of education: Not on file  . Highest education level: Not on file  Occupational History  . Not on file  Tobacco Use  . Smoking status: Never Smoker  . Smokeless tobacco: Never Used  Vaping Use  . Vaping Use: Never used  Substance and Sexual Activity  . Alcohol use: No  . Drug use: No  . Sexual activity: Not on file  Other Topics  Concern  . Not on file  Social History Narrative   Lives with spouse in [place].  2 grown daughters.   Retired Glass blower/designer     Social Determinants of Radio broadcast assistant Strain:   . Difficulty of Paying Living Expenses: Not on file  Food Insecurity:   . Worried About Charity fundraiser in the Last Year: Not on file  . Ran Out of Food in the Last Year: Not on file  Transportation Needs:   . Lack of Transportation (Medical): Not on file  . Lack of Transportation (Non-Medical): Not on file  Physical Activity:   . Days of Exercise per Week: Not on file  . Minutes of Exercise per Session: Not on file  Stress:   . Feeling of Stress : Not on file  Social Connections:   . Frequency of Communication with Friends and Family: Not on file  . Frequency of Social Gatherings with Friends and Family: Not on file  . Attends Religious Services: Not on file  . Active Member of Clubs or Organizations: Not on file  . Attends Archivist Meetings: Not on file  . Marital Status: Not on file     Family History: The patient's family history includes Antithrombin III deficiency in her mother; Cirrhosis in her mother; Coronary artery disease in her father; Heart attack in her brother, father, and sister; Hypertension in her brother, father, and sister; Lung cancer in her brother; Protein S deficiency in her daughter and sister; Ulcerative colitis in her mother. There is no history of Stroke, Colitis, Colon polyps, Esophageal cancer, Liver cancer, Pancreatic cancer, Rectal cancer, or Stomach cancer.  ROS:   Review of Systems  Constitution: Negative for decreased appetite, fever and weight gain.  HENT: Negative for congestion, ear discharge, hoarse voice and sore throat.  Eyes: Negative for discharge, redness, vision loss in right eye and visual halos.  Cardiovascular: Negative for chest pain, dyspnea on exertion, leg swelling, orthopnea and palpitations.  Respiratory: Negative for  cough, hemoptysis, shortness of breath and snoring.   Endocrine: Negative for heat intolerance and polyphagia.  Hematologic/Lymphatic: Negative for bleeding problem. Does not bruise/bleed easily.  Skin: Negative for flushing, nail changes, rash and suspicious lesions.  Musculoskeletal: Negative for arthritis, joint pain, muscle cramps, myalgias, neck pain and stiffness.  Gastrointestinal: Negative for abdominal pain, bowel incontinence, diarrhea and excessive appetite.  Genitourinary: Negative for decreased libido, genital sores and incomplete emptying.  Neurological: Negative for brief paralysis, focal weakness, headaches and loss of balance.  Psychiatric/Behavioral: Negative for altered mental status, depression and suicidal ideas.  Allergic/Immunologic: Negative for HIV exposure and persistent infections.    EKGs/Labs/Other Studies Reviewed:    The following studies were reviewed today:   EKG:  The ekg ordered today demonstrates   Echocardiogram IMPRESSIONS  1. Left ventricular ejection fraction, by estimation, is 60 to 65%. The left ventricle has normal function. The left ventricle has no regional wall motion abnormalities. There is moderate concentric left ventricular hypertrophy. Left ventricular  diastolic parameters are consistent with Grade I diastolic dysfunction (impaired relaxation).  2. Right ventricular systolic function is normal. The right ventricular size is normal. There is normal pulmonary artery systolic pressure.  3. Left atrial size was mildly dilated.  4. The mitral valve is normal in structure. Trivial mitral valve regurgitation. No evidence of mitral stenosis.  5. The aortic valve is tricuspid. Aortic valve regurgitation is not visualized. No aortic stenosis is present.  6. The inferior vena cava is normal in size with greater than 50% respiratory variability, suggesting right atrial pressure of 3 mmHg.    Recent Labs: 11/14/2019: TSH 2.020 03/10/2020: Pro B  Natriuretic peptide (BNP) 135.0 05/26/2020: ALT 31; BUN 13; Creatinine, Ser 0.78; Hemoglobin 13.2; Platelets 301; Potassium 4.4; Sodium 138  Recent Lipid Panel    Component Value Date/Time   TRIG 74.0 03/10/2020 1646    Physical Exam:    VS:  BP 110/68 (BP Location: Left Arm, Patient Position: Sitting, Cuff Size: Normal)   Pulse 68   Ht 5' 2.5" (1.588 m)   Wt 175 lb 6.4 oz (79.6 kg)   SpO2 96%   BMI 31.57 kg/m     Wt Readings from Last 3 Encounters:  06/05/20 175 lb 6.4 oz (79.6 kg)  05/26/20 173 lb 9.6 oz (78.7 kg)  03/10/20 177 lb 3.2 oz (80.4 kg)     GEN: Well nourished, well developed in no acute distress HEENT: Normal NECK: No JVD; No carotid bruits LYMPHATICS: No lymphadenopathy CARDIAC: S1S2 noted,RRR, no murmurs, rubs, gallops RESPIRATORY:  Clear to auscultation without rales, wheezing or rhonchi  ABDOMEN: Soft, non-tender, non-distended, +bowel sounds, no guarding. EXTREMITIES: +1 bilateral leg edema, No cyanosis, no clubbing MUSCULOSKELETAL:  No deformity  SKIN: Warm and dry NEUROLOGIC:  Alert and oriented x 3, non-focal PSYCHIATRIC:  Normal affect, good insight  ASSESSMENT:    1. Ventricular premature beats   2. Essential hypertension, benign   3. Primary hypertension   4. Chronic anticoagulation   5. Hypercholesteremia    PLAN:     1.  She does have worsening leg edema I will increase her Lasix to 20 mg twice daily.  I reviewed her recent blood work which was done in May 26, 2020.    Her blood pressure deceptively in the office today.  Continue  chronic anticoagulation for her protein S deficiency which is managed by heme-onc.  Continue patient on Lipitor 40 mg daily.  The patient is in agreement with the above plan. The patient left the office in stable condition.  The patient will follow up in 1 month due to medication change.  Medication Adjustments/Labs and Tests Ordered: Current medicines are reviewed at length with the patient today.   Concerns regarding medicines are outlined above.  No orders of the defined types were placed in this encounter.  Meds ordered this encounter  Medications  . aspirin EC 81 MG tablet    Sig: Take 1 tablet (81 mg total) by mouth daily.    Dispense:  90 tablet    Refill:  3  . carvedilol (COREG) 25 MG tablet    Sig: Take 1 tablet (25 mg total) by mouth 2 (two) times daily with a meal.    Dispense:  180 tablet    Refill:  3  . atorvastatin (LIPITOR) 40 MG tablet    Sig: Take 1 tablet (40 mg total) by mouth daily at 12 noon.    Dispense:  90 tablet    Refill:  3  . furosemide (LASIX) 20 MG tablet    Sig: Take 1 tablet (20 mg total) by mouth 2 (two) times daily.    Dispense:  180 tablet    Refill:  3    Patient Instructions  Medication Instructions:  Increase Lasik to 20 mg twice a daily  Refills sent for Carvedilol, Aspirin, Atorvastatin, and Lasik  *If you need a refill on your cardiac medications before your next appointment, please call your pharmacy*   Lab Work: None If you have labs (blood work) drawn today and your tests are completely normal, you will receive your results only by: Marland Kitchen MyChart Message (if you have MyChart) OR . A paper copy in the mail If you have any lab test that is abnormal or we need to change your treatment, we will call you to review the results.   Testing/Procedures: None   Follow-Up: At [facility], you and your health needs are our priority.  As part of our continuing mission to provide you with exceptional heart care, we have created designated Provider Care Teams.  These Care Teams include your primary Cardiologist (physician) and Advanced Practice Providers (APPs -  Physician Assistants and Nurse Practitioners) who all work together to provide you with the care you need, when you need it.  We recommend signing up for the patient portal called "MyChart".  Sign up information is provided on this After Visit Summary.  MyChart is used to connect  with patients for Virtual Visits (Telemedicine).  Patients are able to view lab/test results, encounter notes, upcoming appointments, etc.  Non-urgent messages can be sent to your provider as well.   To learn more about what you can do with MyChart, go to [email or web address].    Your next appointment:   1 month(s)  The format for your next appointment:   In Person  Provider:   Berniece Salines, DO   Other Instructions      Adopting a Healthy Lifestyle.  Know what a healthy weight is for you (roughly BMI <25) and aim to maintain this   Aim for 7+ servings of fruits and vegetables daily   65-80+ fluid ounces of water or unsweet tea for healthy kidneys   Limit to max 1 drink of alcohol per day; avoid smoking/tobacco   Limit animal fats in diet for  cholesterol and heart health - choose grass fed whenever available   Avoid highly processed foods, and foods high in saturated/trans fats   Aim for low stress - take time to unwind and care for your mental health   Aim for 150 min of moderate intensity exercise weekly for heart health, and weights twice weekly for bone health   Aim for 7-9 hours of sleep daily   When it comes to diets, agreement about the perfect plan isnt easy to find, even among the experts. Experts at the [employer] developed an idea known as the Healthy Eating Plate. Just imagine a plate divided into logical, healthy portions.   The emphasis is on diet quality:   Load up on vegetables and fruits - one-half of your plate: Aim for color and variety, and remember that potatoes dont count.   Go for whole grains - one-quarter of your plate: Whole wheat, barley, wheat berries, quinoa, oats, brown rice, and foods made with them. If you want pasta, go with whole wheat pasta.   Protein power - one-quarter of your plate: Fish, chicken, beans, and nuts are all healthy, versatile protein sources. Limit red meat.   The diet, however, does go beyond  the plate, offering a few other suggestions.   Use healthy plant oils, such as olive, canola, soy, corn, sunflower and peanut. Check the labels, and avoid partially hydrogenated oil, which have unhealthy trans fats.   If youre thirsty, drink water. Coffee and tea are good in moderation, but skip sugary drinks and limit milk and dairy products to one or two daily servings.   The type of carbohydrate in the diet is more important than the amount. Some sources of carbohydrates, such as vegetables, fruits, whole grains, and beans-are healthier than others.   Finally, stay active  Signed, Berniece Salines, DO  06/08/2020 9:41 AM    [place]

## 2020-06-07 ENCOUNTER — Encounter: Payer: Self-pay | Admitting: Family Medicine

## 2020-06-10 DIAGNOSIS — E7800 Pure hypercholesterolemia, unspecified: Secondary | ICD-10-CM | POA: Diagnosis not present

## 2020-06-10 DIAGNOSIS — K219 Gastro-esophageal reflux disease without esophagitis: Secondary | ICD-10-CM | POA: Diagnosis not present

## 2020-06-10 DIAGNOSIS — S82842D Displaced bimalleolar fracture of left lower leg, subsequent encounter for closed fracture with routine healing: Secondary | ICD-10-CM | POA: Diagnosis not present

## 2020-06-10 DIAGNOSIS — E119 Type 2 diabetes mellitus without complications: Secondary | ICD-10-CM | POA: Diagnosis not present

## 2020-06-10 DIAGNOSIS — M1711 Unilateral primary osteoarthritis, right knee: Secondary | ICD-10-CM | POA: Diagnosis not present

## 2020-06-10 DIAGNOSIS — I10 Essential (primary) hypertension: Secondary | ICD-10-CM | POA: Diagnosis not present

## 2020-06-10 DIAGNOSIS — D649 Anemia, unspecified: Secondary | ICD-10-CM | POA: Diagnosis not present

## 2020-06-10 DIAGNOSIS — E669 Obesity, unspecified: Secondary | ICD-10-CM | POA: Diagnosis not present

## 2020-06-10 DIAGNOSIS — I4891 Unspecified atrial fibrillation: Secondary | ICD-10-CM | POA: Diagnosis not present

## 2020-06-11 DIAGNOSIS — M4316 Spondylolisthesis, lumbar region: Secondary | ICD-10-CM | POA: Diagnosis not present

## 2020-06-11 DIAGNOSIS — M542 Cervicalgia: Secondary | ICD-10-CM

## 2020-06-11 HISTORY — DX: Cervicalgia: M54.2

## 2020-06-12 DIAGNOSIS — M48062 Spinal stenosis, lumbar region with neurogenic claudication: Secondary | ICD-10-CM | POA: Diagnosis not present

## 2020-06-14 ENCOUNTER — Other Ambulatory Visit: Payer: Self-pay | Admitting: Family Medicine

## 2020-06-15 ENCOUNTER — Other Ambulatory Visit: Payer: Self-pay

## 2020-06-15 MED ORDER — PANTOPRAZOLE SODIUM 40 MG PO TBEC: 40.0000 mg | DELAYED_RELEASE_TABLET | Freq: Every morning | ORAL | 1 refills | Status: DC

## 2020-06-16 ENCOUNTER — Ambulatory Visit (INDEPENDENT_AMBULATORY_CARE_PROVIDER_SITE_OTHER): Payer: Medicare Other

## 2020-06-16 DIAGNOSIS — Z23 Encounter for immunization: Secondary | ICD-10-CM

## 2020-06-16 NOTE — Progress Notes (Signed)
   Covid-19 Vaccination Clinic  Name:  Pamela [name]    MRN: [id number] DOB: Apr 04, 1946  06/16/2020  Pamela [name] was observed post Covid-19 immunization for 15 minutes without incident. She was provided with Vaccine Information Sheet and instruction to access the V-Safe system.   Pamela [name] was instructed to call 911 with any severe reactions post vaccine: Marland Kitchen Difficulty breathing  . Swelling of face and throat  . A fast heartbeat  . A bad rash all over body  . Dizziness and weakness

## 2020-06-17 DIAGNOSIS — S82842D Displaced bimalleolar fracture of left lower leg, subsequent encounter for closed fracture with routine healing: Secondary | ICD-10-CM | POA: Diagnosis not present

## 2020-06-17 DIAGNOSIS — I10 Essential (primary) hypertension: Secondary | ICD-10-CM | POA: Diagnosis not present

## 2020-06-17 DIAGNOSIS — E119 Type 2 diabetes mellitus without complications: Secondary | ICD-10-CM | POA: Diagnosis not present

## 2020-06-17 DIAGNOSIS — I4891 Unspecified atrial fibrillation: Secondary | ICD-10-CM | POA: Diagnosis not present

## 2020-06-17 DIAGNOSIS — K219 Gastro-esophageal reflux disease without esophagitis: Secondary | ICD-10-CM | POA: Diagnosis not present

## 2020-06-17 DIAGNOSIS — E669 Obesity, unspecified: Secondary | ICD-10-CM | POA: Diagnosis not present

## 2020-06-17 DIAGNOSIS — M1711 Unilateral primary osteoarthritis, right knee: Secondary | ICD-10-CM | POA: Diagnosis not present

## 2020-06-17 DIAGNOSIS — E7800 Pure hypercholesterolemia, unspecified: Secondary | ICD-10-CM | POA: Diagnosis not present

## 2020-06-17 DIAGNOSIS — D649 Anemia, unspecified: Secondary | ICD-10-CM | POA: Diagnosis not present

## 2020-06-18 DIAGNOSIS — M25572 Pain in left ankle and joints of left foot: Secondary | ICD-10-CM | POA: Diagnosis not present

## 2020-06-24 DIAGNOSIS — S82842D Displaced bimalleolar fracture of left lower leg, subsequent encounter for closed fracture with routine healing: Secondary | ICD-10-CM | POA: Diagnosis not present

## 2020-06-24 DIAGNOSIS — E669 Obesity, unspecified: Secondary | ICD-10-CM | POA: Diagnosis not present

## 2020-06-24 DIAGNOSIS — K219 Gastro-esophageal reflux disease without esophagitis: Secondary | ICD-10-CM | POA: Diagnosis not present

## 2020-06-24 DIAGNOSIS — E119 Type 2 diabetes mellitus without complications: Secondary | ICD-10-CM | POA: Diagnosis not present

## 2020-06-24 DIAGNOSIS — I10 Essential (primary) hypertension: Secondary | ICD-10-CM | POA: Diagnosis not present

## 2020-06-24 DIAGNOSIS — M1711 Unilateral primary osteoarthritis, right knee: Secondary | ICD-10-CM | POA: Diagnosis not present

## 2020-06-24 DIAGNOSIS — D649 Anemia, unspecified: Secondary | ICD-10-CM | POA: Diagnosis not present

## 2020-06-24 DIAGNOSIS — E7800 Pure hypercholesterolemia, unspecified: Secondary | ICD-10-CM | POA: Diagnosis not present

## 2020-06-24 DIAGNOSIS — I4891 Unspecified atrial fibrillation: Secondary | ICD-10-CM | POA: Diagnosis not present

## 2020-07-06 DIAGNOSIS — Z7901 Long term (current) use of anticoagulants: Secondary | ICD-10-CM | POA: Diagnosis not present

## 2020-07-06 DIAGNOSIS — D6859 Other primary thrombophilia: Secondary | ICD-10-CM | POA: Diagnosis not present

## 2020-07-06 LAB — POCT INR: INR: 3.8 — AB (ref 2.0–3.0)

## 2020-07-07 ENCOUNTER — Telehealth: Payer: Self-pay | Admitting: Hematology and Oncology

## 2020-07-07 ENCOUNTER — Other Ambulatory Visit: Payer: Self-pay | Admitting: Hematology and Oncology

## 2020-07-09 NOTE — Telephone Encounter (Signed)
Late entry from 07/06/2020: Spoke with patient and made adjustments to Coumadin as documented in flowsheet.

## 2020-07-13 ENCOUNTER — Other Ambulatory Visit: Payer: Self-pay

## 2020-07-13 ENCOUNTER — Telehealth: Payer: Self-pay

## 2020-07-13 DIAGNOSIS — G459 Transient cerebral ischemic attack, unspecified: Secondary | ICD-10-CM | POA: Insufficient documentation

## 2020-07-13 DIAGNOSIS — I493 Ventricular premature depolarization: Secondary | ICD-10-CM | POA: Insufficient documentation

## 2020-07-13 LAB — POCT INR: INR: 2.1 (ref 2.0–3.0)

## 2020-07-13 NOTE — Telephone Encounter (Addendum)
Pt called to report her INR results this am, which is 2.1.   Pt is currently taking Coumadin 2.5mg  on Tuesday & Sat. Coumadin 5mg  other days.   Dr Bobby Rumpf notified of above. He wants pt to stay on same dosage as above.  I called pt, and told her Dr Bobby Rumpf response. Pt verbalized understanding.

## 2020-07-14 ENCOUNTER — Encounter: Payer: Self-pay | Admitting: Cardiology

## 2020-07-14 ENCOUNTER — Ambulatory Visit (INDEPENDENT_AMBULATORY_CARE_PROVIDER_SITE_OTHER): Payer: Medicare PPO | Admitting: Cardiology

## 2020-07-14 ENCOUNTER — Other Ambulatory Visit: Payer: Self-pay

## 2020-07-14 VITALS — BP 112/78 | HR 65 | Ht 66.0 in | Wt 176.0 lb

## 2020-07-14 DIAGNOSIS — I493 Ventricular premature depolarization: Secondary | ICD-10-CM | POA: Diagnosis not present

## 2020-07-14 DIAGNOSIS — I480 Paroxysmal atrial fibrillation: Secondary | ICD-10-CM | POA: Diagnosis not present

## 2020-07-14 DIAGNOSIS — I10 Essential (primary) hypertension: Secondary | ICD-10-CM

## 2020-07-14 MED ORDER — LISINOPRIL 5 MG PO TABS
5.0000 mg | ORAL_TABLET | Freq: Every day | ORAL | 2 refills | Status: DC
Start: 2020-07-14 — End: 2020-07-29

## 2020-07-14 NOTE — Progress Notes (Signed)
Cardiology Office Note:    Date:  07/14/2020   ID:  Pamela [name], DOB 1946/06/10, MRN [id number]  PCP:  [name], MD  Cardiologist:  Berniece Salines, DO  Electrophysiologist:  None   Referring MD: [name], MD   " I am doing well"   History of Present Illness:    Pamela [name] is a 74 y.o. 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  Since I last saw the patient she tells me that she had been tracking her blood pressure and this has been increasing gradually.  She did share with me her blood pressure recording from home and her averages between 150 to 160 mmHg.   Past Medical History:  Diagnosis Date  . Anemia   . Asthma   . Atrial fibrillation [I48.91] 10/01/2014  . Bilateral leg cramps   . Chronic anticoagulation   . Chronic constipation   . Closed bimalleolar fracture of left ankle 02/26/2020  . Complication of anesthesia   . Deep venous thrombosis ([place]) 07/30/2013  . Diabetes mellitus   . Dizziness 05/23/2011  . DJD (degenerative joint disease)   . DOE (dyspnea on exertion) 03/03/2013   Onset early 2020  - 02/05/2019   Walked RA  2 laps @  approx 2108ft each @ nl pace  stopped due to  Light headed, no sp or sob, no desats    . DVT (deep venous thrombosis) ([place]) 07/30/2013  . Dyspnea    occasionally  . Dyspnea on exertion 03/03/2013   Onset early 2020  - 02/05/2019   Walked RA  2 laps @  approx 228ft each @ nl pace  stopped due to  Light headed, no sp or sob, no desats    . Encounter for orthopedic follow-up care 03/10/2020  . GERD (gastroesophageal reflux disease)   . Heart murmur    as a child  . HTN (hypertension)   .  Hypercholesteremia   . Hypertension 05/23/2011  . Hyponatremia 02/06/2019   Na 126 on no obvious meds to cause it with last na 133 in 2014   . Long term (current) use of anticoagulants 03/03/2013  . Low back pain 07/06/2018  . Low back strain 08/09/2018  . Neck pain 06/11/2020  . Obesity   . Osteoarthritis of right knee 07/06/2018  . Pain in right knee 07/06/2018  . Pneumonia   . PONV (postoperative nausea and vomiting)    needs pre-medication  . Protein S deficiency ([place])    prior DVT L knee, L arm DVT,  follow[facility] Dr West Pugh at [facility]  . PVC's (premature ventricular contractions)   . S/P cardiac catheterization 02/2013   Normal coronaries; low normal EF at 50%  . Sleep apnea    can't use the cpap (diagnosed 2017)  . Solitary pulmonary nodule on lung CT 02/06/2019   First noted 01/13/2014 > no change as of 12/21/2018  . Spondylolisthesis, lumbar region 08/09/2018  . Stroke [facility])    TIA in 2018 or 2019  . TIA (transient ischemic attack)   . Transient ischemic attack 07/30/2013  . Ventricular premature beats 05/23/2011    Past Surgical History:  Procedure Laterality Date  . [place]  bilateral  . CHOLECYSTECTOMY  1993  . COLONOSCOPY    . FOOT TENDON SURGERY Right   . left knee replacement  2006  . ORIF ANKLE FRACTURE Left 02/26/2020   Procedure: OPEN REDUCTION INTERNAL FIXATION (ORIF) ANKLE FRACTURE;  Surgeon: Nicholes Stairs, MD;  Location: [place];  Service: Orthopedics;  Laterality: Left;  90 mins  . thumb surgery Left 2018  . TONSILLECTOMY    . TOTAL ABDOMINAL HYSTERECTOMY  1988    Current Medications: Current Meds  Medication Sig  . acetaminophen (TYLENOL) 500 MG tablet Take 500-1,000 mg by mouth every 6 (six) hours as needed (for pain.).  Marland Kitchen albuterol (VENTOLIN HFA) 108 (90 Base) MCG/ACT inhaler Inhale 1-2 puffs into the lungs every 6 (six) hours as needed for wheezing or shortness of breath.   Marland Kitchen amLODipine (NORVASC) 2.5 MG tablet   . ascorbic acid  (VITAMIN C) 500 MG tablet Take 500 mg by mouth.  Marland Kitchen aspirin EC 81 MG tablet Take 1 tablet (81 mg total) by mouth daily.  Marland Kitchen atorvastatin (LIPITOR) 40 MG tablet Take 1 tablet (40 mg total) by mouth daily at 12 noon.  Marland Kitchen azelastine (ASTELIN) 137 MCG/SPRAY nasal spray Place 2 sprays into the nose daily as needed for rhinitis or allergies.   . B Complex-C (B-COMPLEX WITH VITAMIN C) tablet Take 1 tablet by mouth daily with lunch.  . carvedilol (COREG) 25 MG tablet Take 1 tablet (25 mg total) by mouth 2 (two) times daily with a meal.  . cefdinir (OMNICEF) 300 MG capsule Take 300 mg by mouth.  . Cholecalciferol (VITAMIN D3) 50 MCG (2000 UT) TABS Take 2,000 Units by mouth daily with lunch.   . denosumab (PROLIA) 60 MG/ML SOSY injection Inject 60 mg into the skin every 6 (six) months.  . doxycycline (VIBRAMYCIN) 100 MG capsule Take 100 mg by mouth.  . Dulaglutide (TRULICITY) 3 GY/6.9SW SOPN Inject 3 mg into the skin once a week.  . enoxaparin (LOVENOX) 120 MG/0.8ML injection Inject 120 mg into the skin.  Marland Kitchen estradiol (ESTRACE) 0.1 MG/GM vaginal cream   . famotidine (PEPCID) 20 MG tablet Take 20 mg by mouth daily.  . ferrous sulfate 325 (65 FE) MG tablet Take 325 mg by mouth daily.  . furosemide (LASIX) 20 MG tablet Take 1 tablet (20 mg total) by mouth 2 (two) times daily.  . Insulin Glargine, 1 Unit Dial, (TOUJEO SOLOSTAR) 300 UNIT/ML SOPN Inject 17 Units into the skin daily.   Marland Kitchen ipratropium-albuterol (DUONEB) 0.5-2.5 (3) MG/3ML SOLN Take 3 mLs by nebulization 4 (four) times daily as needed (wheezing/shortness of breath).   Marland Kitchen levofloxacin (LEVAQUIN) 750 MG tablet Take 750 mg by mouth daily.  Marland Kitchen lisinopril (ZESTRIL) 5 MG tablet Take 1 tablet (5 mg total) by mouth daily.  . magnesium oxide (MAG-OX) 400 MG tablet Take 400 mg by mouth.  . Magnesium Oxide 400 (240 Mg) MG TABS Take 2 tablets (800 mg total) by mouth 2 (two) times daily.  . Menthol-Methyl Salicylate (SALONPAS PAIN RELIEF PATCH) PTCH Apply 1 patch  topically daily as needed (pain.).  Marland Kitchen methocarbamol (ROBAXIN) 500 MG tablet Take 500 mg by mouth every 8 (eight) hours as needed for muscle spasms.  . nitrofurantoin, macrocrystal-monohydrate, (MACROBID) 100 MG capsule Take 1 capsule by mouth in the morning and at bedtime.  . ondansetron (ZOFRAN ODT) 4 MG disintegrating tablet Take 1 tablet (4 mg total) by mouth every 8 (eight) hours as needed.  . ondansetron (ZOFRAN-ODT) 8 MG disintegrating tablet Take 8  mg by mouth every 8 (eight) hours as needed for nausea or vomiting.   Marland Kitchen oxyCODONE (OXY IR/ROXICODONE) 5 MG immediate release tablet Take 1 tablet (5 mg total) by mouth every 6 (six) hours as needed for severe pain.  . pantoprazole (PROTONIX) 40 MG tablet Take 1 tablet (40 mg total) by mouth every morning.  . pioglitazone (ACTOS) 30 MG tablet Take 30 mg by mouth.  . polyethylene glycol (MIRALAX / GLYCOLAX) 17 g packet Take 17 g by mouth as needed.   . senna-docusate (SENOKOT S) 8.6-50 MG tablet Take 1 tablet by mouth at bedtime.  . sodium chloride (OCEAN) 0.65 % SOLN nasal spray Place 1-2 sprays into both nostrils 4 (four) times daily as needed for congestion.  . Suvorexant (BELSOMRA) 10 MG TABS Take 10 mg by mouth at bedtime.  . tolterodine (DETROL LA) 2 MG 24 hr capsule Take 2 mg by mouth at bedtime.   . traMADol (ULTRAM) 50 MG tablet Take 1 tablet (50 mg total) by mouth every 6 (six) hours as needed for moderate pain.  Marland Kitchen warfarin (COUMADIN) 1 MG tablet Take 0.5 mg by mouth See admin instructions. Take 0.5 tablet (0.5 mg) with a 5 mg tablet on Sundays, Tuesdays, Thursdays, Saturdays.  Marland Kitchen warfarin (COUMADIN) 5 MG tablet Take 5 mg by mouth daily.  . [DISCONTINUED] lisinopril (ZESTRIL) 2.5 MG tablet Take 2.5 mg by mouth daily.      Allergies:   Ketek [telithromycin], Loxapine succinate, Naproxen sodium, Amoxapine and related, Darvon, Belsomra [suvorexant], Cymbalta [duloxetine hcl], Amoxicillin, and Propoxyphene   Social History   Socioeconomic  History  . Marital status: Married    Spouse name: [name]  . Number of children: Not on file  . Years of education: Not on file  . Highest education level: Not on file  Occupational History  . Not on file  Tobacco Use  . Smoking status: Never Smoker  . Smokeless tobacco: Never Used  Vaping Use  . Vaping Use: Never used  Substance and Sexual Activity  . Alcohol use: No  . Drug use: No  . Sexual activity: Not on file  Other Topics Concern  . Not on file  Social History Narrative   Lives with spouse in [place].  2 grown daughters.   Retired Glass blower/designer     Social Determinants of Radio broadcast assistant Strain:   . Difficulty of Paying Living Expenses: Not on file  Food Insecurity:   . Worried About Charity fundraiser in the Last Year: Not on file  . Ran Out of Food in the Last Year: Not on file  Transportation Needs:   . Lack of Transportation (Medical): Not on file  . Lack of Transportation (Non-Medical): Not on file  Physical Activity:   . Days of Exercise per Week: Not on file  . Minutes of Exercise per Session: Not on file  Stress:   . Feeling of Stress : Not on file  Social Connections:   . Frequency of Communication with Friends and Family: Not on file  . Frequency of Social Gatherings with Friends and Family: Not on file  . Attends Religious Services: Not on file  . Active Member of Clubs or Organizations: Not on file  . Attends Archivist Meetings: Not on file  . Marital Status: Not on file     Family History: The patient's family history includes Antithrombin III deficiency in her mother; Cirrhosis in her mother; Coronary artery disease in her father; Heart  attack in her brother, father, and sister; Hypertension in her brother, father, and sister; Lung cancer in her brother; Protein S deficiency in her daughter and sister; Ulcerative colitis in her mother. There is no history of Stroke, Colitis, Colon polyps, Esophageal cancer, Liver cancer,  Pancreatic cancer, Rectal cancer, or Stomach cancer.  ROS:   Review of Systems  Constitution: Negative for decreased appetite, fever and weight gain.  HENT: Negative for congestion, ear discharge, hoarse voice and sore throat.   Eyes: Negative for discharge, redness, vision loss in right eye and visual halos.  Cardiovascular: Negative for chest pain, dyspnea on exertion, leg swelling, orthopnea and palpitations.  Respiratory: Negative for cough, hemoptysis, shortness of breath and snoring.   Endocrine: Negative for heat intolerance and polyphagia.  Hematologic/Lymphatic: Negative for bleeding problem. Does not bruise/bleed easily.  Skin: Negative for flushing, nail changes, rash and suspicious lesions.  Musculoskeletal: Negative for arthritis, joint pain, muscle cramps, myalgias, neck pain and stiffness.  Gastrointestinal: Negative for abdominal pain, bowel incontinence, diarrhea and excessive appetite.  Genitourinary: Negative for decreased libido, genital sores and incomplete emptying.  Neurological: Negative for brief paralysis, focal weakness, headaches and loss of balance.  Psychiatric/Behavioral: Negative for altered mental status, depression and suicidal ideas.  Allergic/Immunologic: Negative for HIV exposure and persistent infections.    EKGs/Labs/Other Studies Reviewed:    The following studies were reviewed today:   EKG: None today  Echocardiogram IMPRESSIONS  1. Left ventricular ejection fraction, by estimation, is 60 to 65%. The left ventricle has normal function. The left ventricle has no regional wall motion abnormalities. There is moderate concentric left ventricular hypertrophy. Left ventricular  diastolic parameters are consistent with Grade I diastolic dysfunction (impaired relaxation).  2. Right ventricular systolic function is normal. The right ventricular size is normal. There is normal pulmonary artery systolic pressure.  3. Left atrial size was mildly  dilated.  4. The mitral valve is normal in structure. Trivial mitral valve regurgitation. No evidence of mitral stenosis.  5. The aortic valve is tricuspid. Aortic valve regurgitation is not visualized. No aortic stenosis is present.  6. The inferior vena cava is normal in size with greater than 50% respiratory variability, suggesting right atrial pressure of 3 mmHg.    Recent Labs: 11/14/2019: TSH 2.020 03/10/2020: Pro B Natriuretic peptide (BNP) 135.0 05/26/2020: ALT 31; BUN 13; Creatinine, Ser 0.78; Hemoglobin 13.2; Platelets 301; Potassium 4.4; Sodium 138  Recent Lipid Panel    Component Value Date/Time   TRIG 74.0 03/10/2020 1646    Physical Exam:    VS:  BP 112/78   Pulse 65   Ht 5\' 6"  (1.676 m)   Wt 176 lb (79.8 kg)   SpO2 98%   BMI 28.41 kg/m     Wt Readings from Last 3 Encounters:  07/14/20 176 lb (79.8 kg)  06/05/20 175 lb 6.4 oz (79.6 kg)  05/26/20 173 lb 9.6 oz (78.7 kg)     GEN: Well nourished, well developed in no acute distress HEENT: Normal NECK: No JVD; No carotid bruits LYMPHATICS: No lymphadenopathy CARDIAC: S1S2 noted,RRR, no murmurs, rubs, gallops RESPIRATORY:  Clear to auscultation without rales, wheezing or rhonchi  ABDOMEN: Soft, non-tender, non-distended, +bowel sounds, no guarding. EXTREMITIES: No edema, No cyanosis, no clubbing MUSCULOSKELETAL:  No deformity  SKIN: Warm and dry NEUROLOGIC:  Alert and oriented x 3, non-focal PSYCHIATRIC:  Normal affect, good insight  ASSESSMENT:    1. Ventricular premature beats   2. Primary hypertension   3. Paroxysmal atrial fibrillation (  [place])    PLAN:     Her blood pressure is elevated also in the office manually by me 162/74 mmHg.  Like to increase her lisinopril to 5 mg daily.  She also does take carvedilol 25 mg twice a day and is on Lasix 20 mg twice daily.  She will continue taking her blood pressure daily and bring these records to her next visit. Paroxysmal atrial fibrillation continue  anticoagulation and rate control agents.  Her warfarin is being managed by her hematologist oncologist due to her protein S deficiency. She plans to see orthopedic due to the back pain and may be considering injections versus surgery. No complaints  The patient is in agreement with the above plan. The patient left the office in stable condition.  The patient will follow up in 4 to 6 weeks.  Due to medication change.   Medication Adjustments/Labs and Tests Ordered: Current medicines are reviewed at length with the patient today.  Concerns regarding medicines are outlined above.  No orders of the defined types were placed in this encounter.  Meds ordered this encounter  Medications  . lisinopril (ZESTRIL) 5 MG tablet    Sig: Take 1 tablet (5 mg total) by mouth daily.    Dispense:  30 tablet    Refill:  2    There are no Patient Instructions on file for this visit.   Adopting a Healthy Lifestyle.  Know what a healthy weight is for you (roughly BMI <25) and aim to maintain this   Aim for 7+ servings of fruits and vegetables daily   65-80+ fluid ounces of water or unsweet tea for healthy kidneys   Limit to max 1 drink of alcohol per day; avoid smoking/tobacco   Limit animal fats in diet for cholesterol and heart health - choose grass fed whenever available   Avoid highly processed foods, and foods high in saturated/trans fats   Aim for low stress - take time to unwind and care for your mental health   Aim for 150 min of moderate intensity exercise weekly for heart health, and weights twice weekly for bone health   Aim for 7-9 hours of sleep daily   When it comes to diets, agreement about the perfect plan isnt easy to find, even among the experts. Experts at the [employer] developed an idea known as the Healthy Eating Plate. Just imagine a plate divided into logical, healthy portions.   The emphasis is on diet quality:   Load up on vegetables and fruits -  one-half of your plate: Aim for color and variety, and remember that potatoes dont count.   Go for whole grains - one-quarter of your plate: Whole wheat, barley, wheat berries, quinoa, oats, brown rice, and foods made with them. If you want pasta, go with whole wheat pasta.   Protein power - one-quarter of your plate: Fish, chicken, beans, and nuts are all healthy, versatile protein sources. Limit red meat.   The diet, however, does go beyond the plate, offering a few other suggestions.   Use healthy plant oils, such as olive, canola, soy, corn, sunflower and peanut. Check the labels, and avoid partially hydrogenated oil, which have unhealthy trans fats.   If youre thirsty, drink water. Coffee and tea are good in moderation, but skip sugary drinks and limit milk and dairy products to one or two daily servings.   The type of carbohydrate in the diet is more important than the amount. Some sources  of carbohydrates, such as vegetables, fruits, whole grains, and beans-are healthier than others.   Finally, stay active  Signed, Berniece Salines, DO  07/14/2020 4:11 PM    [facility]

## 2020-07-14 NOTE — Patient Instructions (Signed)
Medication Instructions:  Your physician has recommended you make the following change in your medication:   INCREASE: Lisinopril 5 mg daily  *If you need a refill on your cardiac medications before your next appointment, please call your pharmacy*   Lab Work: NONE If you have labs (blood work) drawn today and your tests are completely normal, you will receive your results only by: Marland Kitchen MyChart Message (if you have MyChart) OR . A paper copy in the mail If you have any lab test that is abnormal or we need to change your treatment, we will call you to review the results.   Testing/Procedures: NONE   Follow-Up: At [facility], you and your health needs are our priority.  As part of our continuing mission to provide you with exceptional heart care, we have created designated Provider Care Teams.  These Care Teams include your primary Cardiologist (physician) and Advanced Practice Providers (APPs -  Physician Assistants and Nurse Practitioners) who all work together to provide you with the care you need, when you need it.  We recommend signing up for the patient portal called "MyChart".  Sign up information is provided on this After Visit Summary.  MyChart is used to connect with patients for Virtual Visits (Telemedicine).  Patients are able to view lab/test results, encounter notes, upcoming appointments, etc.  Non-urgent messages can be sent to your provider as well.   To learn more about what you can do with MyChart, go to [email or web address].    Your next appointment:   4 week(s)  The format for your next appointment:   In Person  Provider:   Berniece Salines, DO   Other Instructions

## 2020-07-16 ENCOUNTER — Telehealth: Payer: Self-pay | Admitting: Hematology and Oncology

## 2020-07-16 NOTE — Telephone Encounter (Signed)
INR therapeutic, patient advised to continue same Coumadin

## 2020-07-17 DIAGNOSIS — M4316 Spondylolisthesis, lumbar region: Secondary | ICD-10-CM | POA: Diagnosis not present

## 2020-07-17 DIAGNOSIS — Z6832 Body mass index (BMI) 32.0-32.9, adult: Secondary | ICD-10-CM | POA: Diagnosis not present

## 2020-07-21 ENCOUNTER — Telehealth: Payer: Self-pay

## 2020-07-21 NOTE — Telephone Encounter (Signed)
   [facility] Pre-operative Risk Assessment    HEARTCARE STAFF: - Please ensure there is not already an duplicate clearance open for this procedure. - Under Visit Info/Reason for Call, type in Other and utilize the format Clearance MM/DD/YY or Clearance TBD. Do not use dashes or single digits. - If request is for dental extraction, please clarify the # of teeth to be extracted.  Request for surgical clearance:  1. What type of surgery is being performed? L3-4 translaminar epidural steroid injection   2. When is this surgery scheduled? TBD   3. What type of clearance is required (medical clearance vs. Pharmacy clearance to hold med vs. Both)? Both  4. Are there any medications that need to be held prior to surgery and how long? Coumadin   5. Practice name and name of physician performing surgery? [facility]    Dr. Kristeen Miss   6. What is the office phone number? [phone]   7.   What is the office fax number? [phone] attention Jessica   8.   Anesthesia type (None, local, MAC, general) ? IV sedation   Truddie Hidden 07/21/2020, 8:47 AM  _________________________________________________________________   (provider comments below)

## 2020-07-21 NOTE — Telephone Encounter (Signed)
Patient with diagnosis of afib, protein S deficiency, and DVT on warfarin for anticoagulation.    Procedure: L3-4 translaminar ESI Date of procedure: TBD  CHA2DS2-VASc Score = 7  This indicates a 11.2% annual risk of stroke. The patient's score is based upon: CHF History: 0 HTN History: 1 Diabetes History: 1 Stroke History: 2 (stroke, DVT, protein S deficiency) Vascular Disease History: 0 Age Score: 2 Gender Score: 1  CrCl 35mL/min Platelet count 301K  Per office protocol, patient can hold warfarin for 5 days prior to procedure. Patient will need bridging with Lovenox (enoxaparin) around procedure. This should be coordinated by heme onc who follows patient's warfarin.

## 2020-07-24 ENCOUNTER — Ambulatory Visit: Payer: Medicare PPO

## 2020-07-24 ENCOUNTER — Other Ambulatory Visit: Payer: Self-pay

## 2020-07-24 DIAGNOSIS — E7800 Pure hypercholesterolemia, unspecified: Secondary | ICD-10-CM

## 2020-07-24 DIAGNOSIS — I10 Essential (primary) hypertension: Secondary | ICD-10-CM

## 2020-07-24 DIAGNOSIS — E6609 Other obesity due to excess calories: Secondary | ICD-10-CM

## 2020-07-24 MED ORDER — TRULICITY 3 MG/0.5ML ~~LOC~~ SOAJ: 3.0000 mg | SUBCUTANEOUS | 3 refills | Status: DC

## 2020-07-25 LAB — COMPREHENSIVE METABOLIC PANEL
ALT: 26 IU/L (ref 0–32)
AST: 19 IU/L (ref 0–40)
Albumin/Globulin Ratio: 1.4 (ref 1.2–2.2)
Albumin: 3.6 g/dL — ABNORMAL LOW (ref 3.7–4.7)
Alkaline Phosphatase: 78 IU/L (ref 44–121)
BUN/Creatinine Ratio: 17 (ref 12–28)
BUN: 18 mg/dL (ref 8–27)
Bilirubin Total: 0.5 mg/dL (ref 0.0–1.2)
CO2: 28 mmol/L (ref 20–29)
Calcium: 8.8 mg/dL (ref 8.7–10.3)
Chloride: 98 mmol/L (ref 96–106)
Creatinine, Ser: 1.06 mg/dL — ABNORMAL HIGH (ref 0.57–1.00)
GFR calc Af Amer: 59 mL/min/{1.73_m2} — ABNORMAL LOW (ref >59)
GFR calc non Af Amer: 51 mL/min/{1.73_m2} — ABNORMAL LOW (ref >59)
Globulin, Total: 2.6 g/dL (ref 1.5–4.5)
Glucose: 156 mg/dL — ABNORMAL HIGH (ref 65–99)
Potassium: 4.4 mmol/L (ref 3.5–5.2)
Sodium: 139 mmol/L (ref 134–144)
Total Protein: 6.2 g/dL (ref 6.0–8.5)

## 2020-07-25 LAB — CBC WITH DIFF/PLATELET
Basophils Absolute: 0 10*3/uL (ref 0.0–0.2)
Basos: 1 %
EOS (ABSOLUTE): 0.2 10*3/uL (ref 0.0–0.4)
Eos: 3 %
Hematocrit: 37.9 % (ref 34.0–46.6)
Hemoglobin: 12.5 g/dL (ref 11.1–15.9)
Immature Grans (Abs): 0 10*3/uL (ref 0.0–0.1)
Immature Granulocytes: 0 %
Lymphocytes Absolute: 1.5 10*3/uL (ref 0.7–3.1)
Lymphs: 26 %
MCH: 30.6 pg (ref 26.6–33.0)
MCHC: 33 g/dL (ref 31.5–35.7)
MCV: 93 fL (ref 79–97)
Monocytes Absolute: 0.6 10*3/uL (ref 0.1–0.9)
Monocytes: 10 %
Neutrophils Absolute: 3.4 10*3/uL (ref 1.4–7.0)
Neutrophils: 60 %
Platelets: 268 10*3/uL (ref 150–450)
RBC: 4.08 x10E6/uL (ref 3.77–5.28)
RDW: 11.9 % (ref 11.7–15.4)
WBC: 5.8 10*3/uL (ref 3.4–10.8)

## 2020-07-25 LAB — LIPID PANEL
Chol/HDL Ratio: 2.5 ratio (ref 0.0–4.4)
Cholesterol, Total: 104 mg/dL (ref 100–199)
HDL: 42 mg/dL (ref >39)
LDL Chol Calc (NIH): 48 mg/dL (ref 0–99)
Triglycerides: 62 mg/dL (ref 0–149)
VLDL Cholesterol Cal: 14 mg/dL (ref 5–40)

## 2020-07-25 LAB — CARDIOVASCULAR RISK ASSESSMENT

## 2020-07-26 ENCOUNTER — Encounter: Payer: Self-pay | Admitting: Family Medicine

## 2020-07-27 ENCOUNTER — Telehealth: Payer: Self-pay | Admitting: Hematology and Oncology

## 2020-07-27 NOTE — Telephone Encounter (Signed)
INR not therapeutic, increased warfarin to 5mg  every day but Mon and Fri, the 2.5mg . Repeat INR in 1 week. Patient verbalizes understanding.

## 2020-07-29 ENCOUNTER — Other Ambulatory Visit: Payer: Self-pay

## 2020-07-29 ENCOUNTER — Ambulatory Visit (INDEPENDENT_AMBULATORY_CARE_PROVIDER_SITE_OTHER): Payer: Medicare PPO | Admitting: Family Medicine

## 2020-07-29 ENCOUNTER — Encounter: Payer: Self-pay | Admitting: Family Medicine

## 2020-07-29 VITALS — BP 120/64 | HR 71 | Temp 97.2°F | Resp 18 | Ht 60.0 in | Wt 174.8 lb

## 2020-07-29 DIAGNOSIS — D6859 Other primary thrombophilia: Secondary | ICD-10-CM

## 2020-07-29 DIAGNOSIS — I4811 Longstanding persistent atrial fibrillation: Secondary | ICD-10-CM | POA: Diagnosis not present

## 2020-07-29 DIAGNOSIS — D6869 Other thrombophilia: Secondary | ICD-10-CM

## 2020-07-29 DIAGNOSIS — E871 Hypo-osmolality and hyponatremia: Secondary | ICD-10-CM

## 2020-07-29 DIAGNOSIS — E1140 Type 2 diabetes mellitus with diabetic neuropathy, unspecified: Secondary | ICD-10-CM | POA: Diagnosis not present

## 2020-07-29 DIAGNOSIS — M545 Low back pain, unspecified: Secondary | ICD-10-CM

## 2020-07-29 DIAGNOSIS — N3941 Urge incontinence: Secondary | ICD-10-CM | POA: Diagnosis not present

## 2020-07-29 DIAGNOSIS — N3000 Acute cystitis without hematuria: Secondary | ICD-10-CM

## 2020-07-29 DIAGNOSIS — I10 Essential (primary) hypertension: Secondary | ICD-10-CM

## 2020-07-29 DIAGNOSIS — G8929 Other chronic pain: Secondary | ICD-10-CM

## 2020-07-29 DIAGNOSIS — Z794 Long term (current) use of insulin: Secondary | ICD-10-CM

## 2020-07-29 DIAGNOSIS — E6609 Other obesity due to excess calories: Secondary | ICD-10-CM

## 2020-07-29 DIAGNOSIS — Z6834 Body mass index (BMI) 34.0-34.9, adult: Secondary | ICD-10-CM

## 2020-07-29 LAB — POCT URINALYSIS DIP (CLINITEK)
Bilirubin, UA: NEGATIVE
Blood, UA: NEGATIVE
Glucose, UA: 100 mg/dL — AB
Ketones, POC UA: NEGATIVE mg/dL
Nitrite, UA: NEGATIVE
POC PROTEIN,UA: NEGATIVE
Spec Grav, UA: 1.015 (ref 1.010–1.025)
Urobilinogen, UA: 0.2 E.U./dL
pH, UA: 6 (ref 5.0–8.0)

## 2020-07-29 LAB — POCT UA - MICROALBUMIN: Microalbumin Ur, POC: 10 mg/L

## 2020-07-29 MED ORDER — LISINOPRIL 20 MG PO TABS: 20.0000 mg | ORAL_TABLET | Freq: Every day | ORAL | 0 refills | Status: DC

## 2020-07-29 MED ORDER — GABAPENTIN 100 MG PO CAPS: 100.0000 mg | ORAL_CAPSULE | Freq: Three times a day (TID) | ORAL | 0 refills | Status: DC

## 2020-07-29 NOTE — Progress Notes (Signed)
Subjective:  Patient ID: Pamela [name], female    DOB: Dec 17, 1945  Age: 74 y.o. MRN: [id number]  Chief Complaint  Patient presents with  . Hypertension    HPI The patient presents with history of (type 2) diabetes mellitus complicated by hyperlipidemia.. Patient was diagnosed in 57. She is on trulicity 3 mg weekly and toujeo 17 U daily.  Compliance with treatment plan is good. She takes medications as prescribed. Eats a diabetic diet and exercises. She keeps a glucose diary. Sugars runs 96-184.  Checks feet daily and has annual eye exams. No low sugars. Pt has numbness on her left foot where she had surgery. Rigth foot is spared.   Hypertensive heart disease. 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 Bps are elevated. bp 129-179/ 66-88. Patient is currently on carvedilol  25 mg  twice a day, furosemide 20 mg one twice a day, and lisinopril 2.5 mg daily. Amlodipine was discontinued.  BP is poorly controlled. Denies chest pain but does have palpitations an sob. Pt is currently on coumadin which is managed by hematology.  Current dose is warfarin 5 mg daily and 1 mg 0.5 mg Sunday, Tuesday, Thursdays, and Saturdays. 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 Her mother had antithrombin 3 deficiency.  Denies edema.  GERD: controlled on pantoprazole.  Chronic severe back pain: rates her pain 6-/10. Takes tylenol 500 mg one twice a day and tramadol 50 mg one at night. Pt has seen dr. Nelva Bush and Dr. Ellene Route and is scheduled for an ESI on 08/18/2020.    Urge incontinence: Is currently on detrol LA 2 mg once at night. Not sure if it is really helping.   Current Outpatient Medications on File Prior to Visit  Medication Sig Dispense Refill  . acetaminophen (TYLENOL) 500 MG tablet Take 500-1,000 mg by mouth every 6 (six) hours as needed (for pain.).    Marland Kitchen albuterol (VENTOLIN HFA) 108 (90 Base) MCG/ACT inhaler Inhale 1-2 puffs into the  lungs every 6 (six) hours as needed for wheezing or shortness of breath.     Marland Kitchen ascorbic acid (VITAMIN C) 500 MG tablet Take 500 mg by mouth.    Marland Kitchen aspirin EC 81 MG tablet Take 1 tablet (81 mg total) by mouth daily. 90 tablet 3  . atorvastatin (LIPITOR) 40 MG tablet Take 1 tablet (40 mg total) by mouth daily at 12 noon. 90 tablet 3  . azelastine (ASTELIN) 137 MCG/SPRAY nasal spray Place 2 sprays into the nose daily as needed for rhinitis or allergies.     . B Complex-C (B-COMPLEX WITH VITAMIN C) tablet Take 1 tablet by mouth daily with lunch.    . carvedilol (COREG) 25 MG tablet Take 1 tablet (25 mg total) by mouth 2 (two) times daily with a meal. 180 tablet 3  . Cholecalciferol (VITAMIN D3) 50 MCG (2000 UT) TABS Take 2,000 Units by mouth daily with lunch.     . denosumab (PROLIA) 60 MG/ML SOSY injection Inject 60 mg into the skin every 6 (six) months.    . Dulaglutide (TRULICITY) 3 NO/7.0JG SOPN Inject 3 mg into the skin once a week. 2 mL 3  . estradiol (ESTRACE) 0.1 MG/GM vaginal cream     . ferrous sulfate 325 (65 FE) MG tablet Take 325 mg by mouth daily.    . furosemide (LASIX) 20 MG tablet Take 1 tablet (20 mg total) by mouth 2 (two) times  daily. 180 tablet 3  . Insulin Glargine, 1 Unit Dial, (TOUJEO SOLOSTAR) 300 UNIT/ML SOPN Inject 17 Units into the skin daily.     Marland Kitchen ipratropium-albuterol (DUONEB) 0.5-2.5 (3) MG/3ML SOLN Take 3 mLs by nebulization 4 (four) times daily as needed (wheezing/shortness of breath).   0  . Magnesium Oxide 400 (240 Mg) MG TABS Take 2 tablets (800 mg total) by mouth 2 (two) times daily. 120 tablet 2  . Menthol-Methyl Salicylate (SALONPAS PAIN RELIEF PATCH) PTCH Apply 1 patch topically daily as needed (pain.).    Marland Kitchen methocarbamol (ROBAXIN) 500 MG tablet Take 500 mg by mouth every 8 (eight) hours as needed for muscle spasms.    . ondansetron (ZOFRAN-ODT) 8 MG disintegrating tablet Take 8 mg by mouth every 8 (eight) hours as needed for nausea or vomiting.     Marland Kitchen oxyCODONE  (OXY IR/ROXICODONE) 5 MG immediate release tablet Take 1 tablet (5 mg total) by mouth every 6 (six) hours as needed for severe pain. 20 tablet 0  . pantoprazole (PROTONIX) 40 MG tablet Take 1 tablet (40 mg total) by mouth every morning. 90 tablet 1  . polyethylene glycol (MIRALAX / GLYCOLAX) 17 g packet Take 17 g by mouth as needed.     . senna-docusate (SENOKOT S) 8.6-50 MG tablet Take 1 tablet by mouth at bedtime.    . sodium chloride (OCEAN) 0.65 % SOLN nasal spray Place 1-2 sprays into both nostrils 4 (four) times daily as needed for congestion.    Marland Kitchen tolterodine (DETROL LA) 2 MG 24 hr capsule Take 2 mg by mouth at bedtime.     . traMADol (ULTRAM) 50 MG tablet Take 1 tablet (50 mg total) by mouth every 6 (six) hours as needed for moderate pain. 30 tablet 0  . warfarin (COUMADIN) 1 MG tablet Take 0.5 mg by mouth See admin instructions. Take 0.5 tablet (0.5 mg) with a 5 mg tablet on Sundays, Tuesdays, Thursdays, Saturdays.    Marland Kitchen warfarin (COUMADIN) 5 MG tablet Take 5 mg by mouth daily.     No current facility-administered medications on file prior to visit.   Past Medical History:  Diagnosis Date  . Anemia   . Asthma   . Atrial fibrillation [I48.91] 10/01/2014  . Bilateral leg cramps   . Chronic anticoagulation   . Chronic constipation   . Closed bimalleolar fracture of left ankle 02/26/2020  . Complication of anesthesia   . Deep venous thrombosis ([place]) 07/30/2013  . Diabetes mellitus   . Dizziness 05/23/2011  . DJD (degenerative joint disease)   . DOE (dyspnea on exertion) 03/03/2013   Onset early 2020  - 02/05/2019   Walked RA  2 laps @  approx 252ft each @ nl pace  stopped due to  Light headed, no sp or sob, no desats    . DVT (deep venous thrombosis) ([place]) 07/30/2013  . Dyspnea    occasionally  . Dyspnea on exertion 03/03/2013   Onset early 2020  - 02/05/2019   Walked RA  2 laps @  approx 283ft each @ nl pace  stopped due to  Light headed, no sp or sob, no desats    . Encounter for  orthopedic follow-up care 03/10/2020  . GERD (gastroesophageal reflux disease)   . Heart murmur    as a child  . HTN (hypertension)   . Hypercholesteremia   . Hypertension 05/23/2011  . Hyponatremia 02/06/2019   Na 126 on no obvious meds to cause it with last na 133  in 2014   . Long term (current) use of anticoagulants 03/03/2013  . Low back pain 07/06/2018  . Low back strain 08/09/2018  . Neck pain 06/11/2020  . Obesity   . Osteoarthritis of right knee 07/06/2018  . Pain in right knee 07/06/2018  . Pneumonia   . PONV (postoperative nausea and vomiting)    needs pre-medication  . Protein S deficiency ([place])    prior DVT L knee, L arm DVT,  follow[facility] by Dr West Pugh at [facility]  . PVC's (premature ventricular contractions)   . S/P cardiac catheterization 02/2013   Normal coronaries; low normal EF at 50%  . Sleep apnea    can't use the cpap (diagnosed 2017)  . Solitary pulmonary nodule on lung CT 02/06/2019   First noted 01/13/2014 > no change as of 12/21/2018  . Spondylolisthesis, lumbar region 08/09/2018  . Stroke [facility])    TIA in 2018 or 2019  . TIA (transient ischemic attack)   . Transient ischemic attack 07/30/2013  . Ventricular premature beats 05/23/2011   Past Surgical History:  Procedure Laterality Date  . CARPAL TUNNEL RELEASE  1994   bilateral  . CHOLECYSTECTOMY  1993  . COLONOSCOPY    . FOOT TENDON SURGERY Right   . left knee replacement  2006  . ORIF ANKLE FRACTURE Left 02/26/2020   Procedure: OPEN REDUCTION INTERNAL FIXATION (ORIF) ANKLE FRACTURE;  Surgeon: Nicholes Stairs, MD;  Location: [facility];  Service: Orthopedics;  Laterality: Left;  90 mins  . thumb surgery Left 2018  . TONSILLECTOMY    . TOTAL ABDOMINAL HYSTERECTOMY  1988    Family History  Problem Relation Age of Onset  . Cirrhosis Mother   . Antithrombin III deficiency Mother        multiple emboli  . Ulcerative colitis Mother   . Coronary artery disease Father   . Heart attack Father   . Hypertension Father    . Protein S deficiency Sister        prior DVT  . Protein S deficiency Daughter   . Hypertension Sister   . Hypertension Brother   . Lung cancer Brother   . Heart attack Brother   . Heart attack Sister        age 74   . Stroke Neg Hx   . Colitis Neg Hx   . Colon polyps Neg Hx   . Esophageal cancer Neg Hx   . Liver cancer Neg Hx   . Pancreatic cancer Neg Hx   . Rectal cancer Neg Hx   . Stomach cancer Neg Hx    Social History   Socioeconomic History  . Marital status: Married    Spouse name: [name]  . Number of children: Not on file  . Years of education: Not on file  . Highest education level: Not on file  Occupational History  . Not on file  Tobacco Use  . Smoking status: Never Smoker  . Smokeless tobacco: Never Used  Vaping Use  . Vaping Use: Never used  Substance and Sexual Activity  . Alcohol use: No  . Drug use: No  . Sexual activity: Not on file  Other Topics Concern  . Not on file  Social History Narrative   Lives with spouse in [place].  2 grown daughters.   Retired Glass blower/designer     Social Determinants of Radio broadcast assistant Strain:   . Difficulty of Paying Living Expenses: Not on file  Food Insecurity:   .  Worried About Charity fundraiser in the Last Year: Not on file  . Ran Out of Food in the Last Year: Not on file  Transportation Needs:   . Lack of Transportation (Medical): Not on file  . Lack of Transportation (Non-Medical): Not on file  Physical Activity:   . Days of Exercise per Week: Not on file  . Minutes of Exercise per Session: Not on file  Stress:   . Feeling of Stress : Not on file  Social Connections:   . Frequency of Communication with Friends and Family: Not on file  . Frequency of Social Gatherings with Friends and Family: Not on file  . Attends Religious Services: Not on file  . Active Member of Clubs or Organizations: Not on file  . Attends Archivist Meetings: Not on file  . Marital Status: Not on file     Review of Systems  Constitutional: Positive for fatigue. Negative for chills and fever.  HENT: Negative for congestion, ear pain and sore throat.   Respiratory: Positive for shortness of breath. Negative for cough.   Cardiovascular: Positive for palpitations. Negative for chest pain.  Gastrointestinal: Positive for diarrhea, nausea and vomiting. Negative for abdominal pain and constipation.  Genitourinary: Positive for urgency. Negative for dysuria and frequency.  Musculoskeletal: Positive for arthralgias, back pain and myalgias.  Skin: Negative for rash.  Neurological: Positive for weakness and headaches.  Psychiatric/Behavioral: Negative for dysphoric mood. The patient is not nervous/anxious.      Objective:  BP 120/64   Pulse 71   Temp (!) 97.2 F (36.2 C)   Resp 18   Ht 5' (1.524 m)   Wt 174 lb 12.8 oz (79.3 kg)   SpO2 100%   BMI 34.14 kg/m   BP/Weight 07/29/2020 07/14/2020 22/02/3334  Systolic BP 456 256 389  Diastolic BP 64 78 68  Wt. (Lbs) 174.8 176 175.4  BMI 34.14 28.41 31.57    Physical Exam Vitals reviewed.  Constitutional:      Appearance: Normal appearance. She is normal weight.  Neck:     Vascular: No carotid bruit.  Cardiovascular:     Rate and Rhythm: Normal rate and regular rhythm.     Pulses: Normal pulses.     Heart sounds: Normal heart sounds.  Pulmonary:     Effort: Pulmonary effort is normal.     Breath sounds: Normal breath sounds.  Abdominal:     General: Bowel sounds are normal.     Palpations: Abdomen is soft.     Tenderness: There is no abdominal tenderness.  Neurological:     Mental Status: She is alert and oriented to person, place, and time.  Psychiatric:        Mood and Affect: Mood normal.        Behavior: Behavior normal.     Diabetic Foot Exam - Simple   Simple Foot Form Diabetic Foot exam was performed with the following findings: Yes 07/29/2020 11:06 AM  Visual Inspection No deformities, no ulcerations, no other  skin breakdown bilaterally: Yes Sensation Testing See comments: Yes Pulse Check Posterior Tibialis and Dorsalis pulse intact bilaterally: Yes Comments Decreased sensation of left foot. Ankle brace worn.       Lab Results  Component Value Date   WBC 5.8 07/24/2020   HGB 12.5 07/24/2020   HCT 37.9 07/24/2020   PLT 268 07/24/2020   GLUCOSE 156 (H) 07/24/2020   CHOL 104 07/24/2020   TRIG 62 07/24/2020   HDL 42  07/24/2020   LDLCALC 48 07/24/2020   ALT 26 07/24/2020   AST 19 07/24/2020   NA 139 07/24/2020   K 4.4 07/24/2020   CL 98 07/24/2020   CREATININE 1.06 (H) 07/24/2020   BUN 18 07/24/2020   CO2 28 07/24/2020   TSH 2.020 11/14/2019   INR 1.4 (H) 02/28/2020   HGBA1C 7.4 (H) 02/26/2020   MICROALBUR 10 07/29/2020      Assessment & Plan:   1. Type 2 diabetes mellitus with diabetic neuropathy, with long-term current use of insulin (HCC) Control: fairly good.  Recommend check sugars fasting daily. Recommend check feet daily. Recommend annual eye exams. Medicines: no changes at this time, but would consider increasing trulicity. Start on neurontin.  Continue to work on eating a healthy diet and exercise.  Labs drawn today.   - gabapentin (NEURONTIN) 100 MG capsule; Take 1 capsule (100 mg total) by mouth 3 (three) times daily.  Dispense: 90 capsule; Refill: 0 - POCT URINALYSIS DIP (CLINITEK) - POCT UA - Microalbumin - Urine Culture  2. Primary hypertension Poorly controlled.  Recommend increase lisinopril to 20 mg once daily. Continue to work on eating a healthy diet and exercise.    3. Longstanding persistent atrial fibrillation ([place]) The current medical regimen is effective;  continue present plan and medications. Management per specialist.   4. Protein S deficiency ([place]) Continue coumadin.  Management per specialist. Hematology.  5. Hyponatremia Resolved on most recent labs.   6. Acute cystitis without hematuria UA 3+ leuks.  Sent Macrobid  7. Urge  incontinence The current medical regimen: detrol LA.    8. Acquired thrombophilia ([place]) Due to coumadin and necessary due to protein s deficiency and atrial fibrillation.  9. Class 1 obesity due to excess calories with serious comorbidity and body mass index (BMI) of 34.0 to 34.9 in adult Recommend continue to work on eating healthy diet and exercise.  10. Chronic midline low back pain without sciatica  Management per specialist.  Recommend may increase tylenol to 2 po tid-qid depending on mg dosing.    Meds ordered this encounter  Medications  . lisinopril (ZESTRIL) 20 MG tablet    Sig: Take 1 tablet (20 mg total) by mouth daily.    Dispense:  90 tablet    Refill:  0  . gabapentin (NEURONTIN) 100 MG capsule    Sig: Take 1 capsule (100 mg total) by mouth 3 (three) times daily.    Dispense:  90 capsule    Refill:  0    Orders Placed This Encounter  Procedures  . Urine Culture  . POCT URINALYSIS DIP (CLINITEK)  . POCT UA - Microalbumin    I spent 30 minutes dedicated to the care of this patient on the date of this encounter to include face-to-face time with the patient, as well as: chart review. Discussing adjustments to medicines.  Reviewed plan as stated above.   Follow-up: Return in about 3 months (around 10/29/2020) for [place] LABWORK/BP CHECK.Marland Kitchen  An After Visit Summary was printed and given to the patient.  [name], MD [name] [phone]

## 2020-07-29 NOTE — Patient Instructions (Signed)
Increase lisinopril 20 mg once daily.

## 2020-07-30 ENCOUNTER — Other Ambulatory Visit: Payer: Self-pay | Admitting: Family Medicine

## 2020-07-30 ENCOUNTER — Encounter: Payer: Self-pay | Admitting: Family Medicine

## 2020-07-30 LAB — MICROALBUMIN / CREATININE URINE RATIO
Creatinine, Urine: 33.7 mg/dL
Microalb/Creat Ratio: <9 mg/g creat (ref 0–29)
Microalbumin, Urine: <3 ug/mL

## 2020-07-30 LAB — SPECIMEN STATUS REPORT

## 2020-07-30 MED ORDER — NITROFURANTOIN MONOHYD MACRO 100 MG PO CAPS: 100.0000 mg | ORAL_CAPSULE | Freq: Two times a day (BID) | ORAL | 0 refills | Status: DC

## 2020-08-03 NOTE — Progress Notes (Signed)
Pamela [name]  [address] [place],  [place]  [address] [phone]  Clinic Day:  08/04/2020  Referring physician: [name], MD   HISTORY OF PRESENT ILLNESS:  The patient is a 74 y.o. female with mild anemia secondary to kidney disease.  As her hemoglobin has been well above 10, her hemoglobin has been followed conservatively.   Of note, she also has protein S deficiency for which she is on a home Coumadin program which routinely assesses her INR level.  She comes in today for routine followup.  Since her last visit, the patient has been doing well.  She denies having increased fatigue or any overt forms of blood loss which concern her for progressive anemia.   She also denies having any symptoms/findings which concern her for either a DVT or pulmonary embolus, as it pertains to her protein S deficiency.    PHYSICAL EXAM:  Blood pressure (!) 172/72, pulse 75, temperature 97.7 F (36.5 C), resp. rate 16, height 5\' 3"  (1.6 m), weight 172 lb 14.4 oz (78.4 kg), SpO2 97 %. Wt Readings from Last 3 Encounters:  08/04/20 172 lb 14.4 oz (78.4 kg)  07/29/20 174 lb 12.8 oz (79.3 kg)  07/14/20 176 lb (79.8 kg)   Body mass index is 30.63 kg/m. Performance status (ECOG): 1 Physical Exam Constitutional:      Appearance: Normal appearance. She is not ill-appearing.  HENT:     Mouth/Throat:     Mouth: Mucous membranes are moist.     Pharynx: Oropharynx is clear. No oropharyngeal exudate or posterior oropharyngeal erythema.  Cardiovascular:     Rate and Rhythm: Normal rate and regular rhythm.     Heart sounds: No murmur heard.  No friction rub. No gallop.   Pulmonary:     Effort: Pulmonary effort is normal. No respiratory distress.     Breath sounds: Normal breath sounds. No wheezing, rhonchi or rales.  Abdominal:     General: Bowel sounds are normal. There is no distension.     Palpations: Abdomen is soft. There is no mass.     Tenderness: There is no  abdominal tenderness.  Musculoskeletal:        General: No swelling.     Right lower leg: No edema.     Left lower leg: No edema.  Lymphadenopathy:     Cervical: No cervical adenopathy.     Upper Body:     Right upper body: No supraclavicular or axillary adenopathy.     Left upper body: No supraclavicular or axillary adenopathy.     Lower Body: No right inguinal adenopathy. No left inguinal adenopathy.  Skin:    General: Skin is warm.     Coloration: Skin is not jaundiced.     Findings: No lesion or rash.  Neurological:     General: No focal deficit present.     Mental Status: She is alert and oriented to person, place, and time. Mental status is at baseline.     Cranial Nerves: Cranial nerves are intact.  Psychiatric:        Mood and Affect: Mood normal.        Behavior: Behavior normal.        Thought Content: Thought content normal.     LABS:   CBC Latest Ref Rng & Units 07/24/2020 05/26/2020 02/26/2020  WBC 3.4 - 10.8 x10E3/uL 5.8 6.6 8.2  Hemoglobin 11.1 - 15.9 g/dL 12.5 13.2 11.3(L)  Hematocrit 34.0 - 46.6 % 37.9  40.4 34.9(L)  Platelets 150 - 450 x10E3/uL 268 301 321   CMP Latest Ref Rng & Units 07/24/2020 05/26/2020 03/10/2020  Glucose 65 - 99 mg/dL 156(H) 119(H) 132(H)  BUN 8 - 27 mg/dL 18 13 24(H)  Creatinine 0.57 - 1.00 mg/dL 1.06(H) 0.78 0.94  Sodium 134 - 144 mmol/L 139 138 126(L)  Potassium 3.5 - 5.2 mmol/L 4.4 4.4 4.5  Chloride 96 - 106 mmol/L 98 100 89(L)  CO2 20 - 29 mmol/L 28 27 31   Calcium 8.7 - 10.3 mg/dL 8.8 8.7 9.0  Total Protein 6.0 - 8.5 g/dL 6.2 6.8 -  Total Bilirubin 0.0 - 1.2 mg/dL 0.5 0.4 -  Alkaline Phos 44 - 121 IU/L 78 88 -  AST 0 - 40 IU/L 19 27 -  ALT 0 - 32 IU/L 26 31 -    ASSESSMENT & PLAN:   Assessment/Plan:  A 74 y.o. female with a history of mild anemia secondary to chronic renal insufficiency.  I am pleased with her recent hemoglobin of 12.5.  She only has a very mild component of renal insufficiency.   As she is doing well from a  hematologic standpoint, her hemoglobin will continue to be followed conservatively.  With respect to her protein S deficiency, her home Coumadin program will continue to follow her INR levels, with her Coumadin being adjusted accordingly.  As she is doing well,.I will see this patient back in 6 months for repeat clinical assessment.  The patient understands all the plans discussed today and is in agreement with them.      Deaunte Dente Macarthur Critchley, MD

## 2020-08-04 ENCOUNTER — Inpatient Hospital Stay: Payer: Medicare PPO

## 2020-08-04 ENCOUNTER — Telehealth: Payer: Self-pay | Admitting: Hematology and Oncology

## 2020-08-04 ENCOUNTER — Telehealth: Payer: Self-pay | Admitting: Oncology

## 2020-08-04 ENCOUNTER — Other Ambulatory Visit: Payer: Self-pay

## 2020-08-04 ENCOUNTER — Other Ambulatory Visit: Payer: Self-pay | Admitting: Oncology

## 2020-08-04 ENCOUNTER — Encounter: Payer: Self-pay | Admitting: Oncology

## 2020-08-04 ENCOUNTER — Inpatient Hospital Stay: Payer: Medicare PPO | Attending: Oncology | Admitting: Oncology

## 2020-08-04 VITALS — BP 172/72 | HR 75 | Temp 97.7°F | Resp 16 | Ht 63.0 in | Wt 172.9 lb

## 2020-08-04 DIAGNOSIS — N189 Chronic kidney disease, unspecified: Secondary | ICD-10-CM

## 2020-08-04 DIAGNOSIS — D631 Anemia in chronic kidney disease: Secondary | ICD-10-CM

## 2020-08-04 NOTE — Telephone Encounter (Signed)
Per 11/30 los -6mth f/u appt given to patient 

## 2020-08-04 NOTE — Telephone Encounter (Signed)
Telephoned patient yesterday and left message for her to call. She returned call today. INR still 1.8. She states she is taking Coumadin 3.5mg  M,F and 5mg  rest of days. She states she will be having a spinal injection on 12/4 and is supposed to check her INR again on 12/3. Her ASA is on hold for the injection. I do not want to adjust her Coumadin and have her INR get too high before the injection, so will continue same dose for now. She will contact me with INR results on 12/3.

## 2020-08-07 ENCOUNTER — Ambulatory Visit (INDEPENDENT_AMBULATORY_CARE_PROVIDER_SITE_OTHER): Payer: Medicare PPO

## 2020-08-07 ENCOUNTER — Telehealth: Payer: Self-pay

## 2020-08-07 ENCOUNTER — Other Ambulatory Visit: Payer: Self-pay

## 2020-08-07 DIAGNOSIS — Z5181 Encounter for therapeutic drug level monitoring: Secondary | ICD-10-CM

## 2020-08-07 DIAGNOSIS — D6859 Other primary thrombophilia: Secondary | ICD-10-CM

## 2020-08-07 DIAGNOSIS — I824Z9 Acute embolism and thrombosis of unspecified deep veins of unspecified distal lower extremity: Secondary | ICD-10-CM

## 2020-08-07 LAB — POCT INR: INR: 2.8 (ref 2.0–3.0)

## 2020-08-07 NOTE — Patient Instructions (Signed)
Resume previous dose as directed; pre Spinal Injection 12/4 w/Dr Nelva Bush

## 2020-08-07 NOTE — Telephone Encounter (Signed)
Patient called to report INR of 2.8 taken in [facility] at the Orthopedic office.  She is due an injection tomorrow 08/08/2020.  I spoke with Zachery Dauer. PA and she had me instruct the patient to hold coumadin tonight and ask the Orthopedic office tomorrow on when to start the coumadin back.  She is to recheck her INR on Monday 08/10/2020.

## 2020-08-08 DIAGNOSIS — M5416 Radiculopathy, lumbar region: Secondary | ICD-10-CM | POA: Diagnosis not present

## 2020-08-10 ENCOUNTER — Telehealth: Payer: Self-pay | Admitting: Hematology and Oncology

## 2020-08-10 DIAGNOSIS — Z7901 Long term (current) use of anticoagulants: Secondary | ICD-10-CM | POA: Diagnosis not present

## 2020-08-10 DIAGNOSIS — D6859 Other primary thrombophilia: Secondary | ICD-10-CM | POA: Diagnosis not present

## 2020-08-10 NOTE — Telephone Encounter (Signed)
Spoke with patient, INR therapeutic at 2.5, will continue same dose of Coumadin 2.5mg  M,W,F, 5mg  rest of days and repeat INR in 1 week.

## 2020-08-11 ENCOUNTER — Other Ambulatory Visit: Payer: Self-pay

## 2020-08-13 ENCOUNTER — Ambulatory Visit (INDEPENDENT_AMBULATORY_CARE_PROVIDER_SITE_OTHER): Payer: Medicare PPO | Admitting: Family Medicine

## 2020-08-13 ENCOUNTER — Telehealth: Payer: Self-pay | Admitting: Family Medicine

## 2020-08-13 ENCOUNTER — Other Ambulatory Visit: Payer: Self-pay

## 2020-08-13 DIAGNOSIS — Z794 Long term (current) use of insulin: Secondary | ICD-10-CM

## 2020-08-13 DIAGNOSIS — E1140 Type 2 diabetes mellitus with diabetic neuropathy, unspecified: Secondary | ICD-10-CM

## 2020-08-13 NOTE — Chronic Care Management (AMB) (Signed)
°  Chronic Care Management   Note  08/13/2020 Name: Pamela [name] MRN: [id number] DOB: 1945/11/02  Pamela [name] Pamela [name] is a 74 y.o. year old female who is a primary care patient of Cox, Kirsten, MD. I reached out to Pamela [name] by phone today in response to a referral sent by Pamela [name] PCP, CoxElnita Maxwell, MD.   Pamela [name] was given information about Chronic Care Management services today including:  1. CCM service includes personalized support from designated clinical staff supervised by her physician, including individualized plan of care and coordination with other care providers 2. 24/7 contact phone numbers for assistance for urgent and routine care needs. 3. Service will only be billed when office clinical staff spend 20 minutes or more in a month to coordinate care. 4. Only one practitioner may furnish and bill the service in a calendar month. 5. The patient may stop CCM services at any time (effective at the end of the month) by phone call to the office staff.   Patient agreed to services and verbal consent obtained.   Follow up plan:  [place]

## 2020-08-14 ENCOUNTER — Ambulatory Visit (INDEPENDENT_AMBULATORY_CARE_PROVIDER_SITE_OTHER): Payer: Medicare PPO | Admitting: Cardiology

## 2020-08-14 ENCOUNTER — Other Ambulatory Visit: Payer: Self-pay

## 2020-08-14 ENCOUNTER — Encounter: Payer: Self-pay | Admitting: Cardiology

## 2020-08-14 VITALS — BP 100/50 | HR 66 | Ht 63.0 in | Wt 177.0 lb

## 2020-08-14 DIAGNOSIS — I493 Ventricular premature depolarization: Secondary | ICD-10-CM

## 2020-08-14 DIAGNOSIS — G459 Transient cerebral ischemic attack, unspecified: Secondary | ICD-10-CM

## 2020-08-14 DIAGNOSIS — I480 Paroxysmal atrial fibrillation: Secondary | ICD-10-CM | POA: Diagnosis not present

## 2020-08-14 DIAGNOSIS — I10 Essential (primary) hypertension: Secondary | ICD-10-CM

## 2020-08-14 DIAGNOSIS — Z7901 Long term (current) use of anticoagulants: Secondary | ICD-10-CM

## 2020-08-14 DIAGNOSIS — D6859 Other primary thrombophilia: Secondary | ICD-10-CM

## 2020-08-14 LAB — HEMOGLOBIN A1C
Est. average glucose Bld gHb Est-mCnc: 200 mg/dL
Hgb A1c MFr Bld: 8.6 % — ABNORMAL HIGH (ref 4.8–5.6)

## 2020-08-14 LAB — COMPREHENSIVE METABOLIC PANEL
ALT: 44 IU/L — ABNORMAL HIGH (ref 0–32)
AST: 30 IU/L (ref 0–40)
Albumin/Globulin Ratio: 1.5 (ref 1.2–2.2)
Albumin: 3.8 g/dL (ref 3.7–4.7)
Alkaline Phosphatase: 93 IU/L (ref 44–121)
BUN/Creatinine Ratio: 16 (ref 12–28)
BUN: 19 mg/dL (ref 8–27)
Bilirubin Total: 0.4 mg/dL (ref 0.0–1.2)
CO2: 30 mmol/L — ABNORMAL HIGH (ref 20–29)
Calcium: 8.9 mg/dL (ref 8.7–10.3)
Chloride: 96 mmol/L (ref 96–106)
Creatinine, Ser: 1.19 mg/dL — ABNORMAL HIGH (ref 0.57–1.00)
GFR calc Af Amer: 52 mL/min/{1.73_m2} — ABNORMAL LOW (ref >59)
GFR calc non Af Amer: 45 mL/min/{1.73_m2} — ABNORMAL LOW (ref >59)
Globulin, Total: 2.5 g/dL (ref 1.5–4.5)
Glucose: 204 mg/dL — ABNORMAL HIGH (ref 65–99)
Potassium: 4.5 mmol/L (ref 3.5–5.2)
Sodium: 137 mmol/L (ref 134–144)
Total Protein: 6.3 g/dL (ref 6.0–8.5)

## 2020-08-14 MED ORDER — FUROSEMIDE 20 MG PO TABS: 20.0000 mg | ORAL_TABLET | Freq: Every day | ORAL | 3 refills | Status: DC

## 2020-08-14 MED ORDER — WARFARIN SODIUM 1 MG PO TABS: 1.0000 mg | ORAL_TABLET | ORAL | 2 refills | Status: DC

## 2020-08-14 NOTE — Progress Notes (Signed)
Cardiology Office Note:    Date:  08/14/2020   ID:  Pamela [name], DOB 25-May-1946, MRN [id number]  PCP:  [name], MD  Cardiologist:  Berniece Salines, DO  Electrophysiologist:  None   Referring MD: [name], MD   Chief Complaint  Patient presents with  . Follow-up   History of Present Illness:    Pamela [name] is a 74 y.o. female with a hx of hx of Diabetes Mellitus, GERD, protein S deficiency chronic anticoagulation with Coumadinand has had DVT in the past,hypertension, hypercholesteremia, PVC, Paroxysmal atrial fibrillation history of a left heart catheterization with normal coronaries, history of TIA.She had a ORIF ankle fracture in June and status post open reduction internal fixation.  I last saw the patient in November 2021 at that time she was experiencing significant elevated blood pressure.  We discussed blood pressure control at that time I increased her lisinopril to 5 mg daily and she remain on her carvedilol 25 mg twice daily and her Lasix was twice daily as well.  She is here today for follow-up visit.  She tells me since her last visit she was started on gabapentin and ever since then she feels she has been feeling more dizzy and feel that recently she felt dizzy and almost blacked out.  She is concerned about this.  Past Medical History:  Diagnosis Date  . Anemia   . Asthma   . Atrial fibrillation [I48.91] 10/01/2014  . Bilateral leg cramps   . Chronic anticoagulation   . Chronic constipation   . Closed bimalleolar fracture of left ankle 02/26/2020  . Complication of anesthesia   . Deep venous thrombosis ([place]) 07/30/2013  . Diabetes mellitus   . Dizziness 05/23/2011  . DJD (degenerative joint disease)   . DOE (dyspnea on exertion) 03/03/2013   Onset early 2020  - 02/05/2019   Walked RA  2 laps @  approx 248ft each @ nl pace  stopped due to  Light headed, no sp or sob, no desats    . DVT (deep venous thrombosis) ([place]) 07/30/2013   . Dyspnea    occasionally  . Dyspnea on exertion 03/03/2013   Onset early 2020  - 02/05/2019   Walked RA  2 laps @  approx 275ft each @ nl pace  stopped due to  Light headed, no sp or sob, no desats    . Encounter for orthopedic follow-up care 03/10/2020  . GERD (gastroesophageal reflux disease)   . Heart murmur    as a child  . HTN (hypertension)   . Hypercholesteremia   . Hypertension 05/23/2011  . Hyponatremia 02/06/2019   Na 126 on no obvious meds to cause it with last na 133 in 2014   . Long term (current) use of anticoagulants 03/03/2013  . Low back pain 07/06/2018  . Low back strain 08/09/2018  . Neck pain 06/11/2020  . Obesity   . Osteoarthritis of right knee 07/06/2018  . Pain in right knee 07/06/2018  . Pneumonia   . PONV (postoperative nausea and vomiting)    needs pre-medication  . Protein S deficiency ([place])    prior DVT L knee, L arm DVT,  followed by Dr West Pugh at [facility]  . PVC's (premature ventricular contractions)   . S/P cardiac catheterization 02/2013   Normal coronaries; low normal EF at 50%  . Sleep apnea    can't use the cpap (diagnosed 2017)  . Solitary pulmonary nodule on lung CT 02/06/2019  First noted 01/13/2014 > no change as of 12/21/2018  . Spondylolisthesis, lumbar region 08/09/2018  . Stroke [facility])    TIA in 2018 or 2019  . TIA (transient ischemic attack)   . Transient ischemic attack 07/30/2013  . Ventricular premature beats 05/23/2011    Past Surgical History:  Procedure Laterality Date  . CARPAL TUNNEL RELEASE  1994   bilateral  . CHOLECYSTECTOMY  1993  . COLONOSCOPY    . FOOT TENDON SURGERY Right   . left knee replacement  2006  . ORIF ANKLE FRACTURE Left 02/26/2020   Procedure: OPEN REDUCTION INTERNAL FIXATION (ORIF) ANKLE FRACTURE;  Surgeon: Nicholes Stairs, MD;  Location: [place];  Service: Orthopedics;  Laterality: Left;  90 mins  . thumb surgery Left 2018  . TONSILLECTOMY    . TOTAL ABDOMINAL HYSTERECTOMY  1988    Current  Medications: Current Meds  Medication Sig  . acetaminophen (TYLENOL) 500 MG tablet Take 500-1,000 mg by mouth every 6 (six) hours as needed (for pain.).  Marland Kitchen albuterol (VENTOLIN HFA) 108 (90 Base) MCG/ACT inhaler Inhale 1-2 puffs into the lungs every 6 (six) hours as needed for wheezing or shortness of breath.   Marland Kitchen ascorbic acid (VITAMIN C) 500 MG tablet Take 500 mg by mouth.  Marland Kitchen aspirin EC 81 MG tablet Take 1 tablet (81 mg total) by mouth daily.  Marland Kitchen atorvastatin (LIPITOR) 40 MG tablet Take 1 tablet (40 mg total) by mouth daily at 12 noon.  Marland Kitchen azelastine (ASTELIN) 137 MCG/SPRAY nasal spray Place 2 sprays into the nose daily as needed for rhinitis or allergies.   . B Complex-C (B-COMPLEX WITH VITAMIN C) tablet Take 1 tablet by mouth daily with lunch.  . carvedilol (COREG) 25 MG tablet Take 1 tablet (25 mg total) by mouth 2 (two) times daily with a meal.  . Cholecalciferol (VITAMIN D3) 50 MCG (2000 UT) TABS Take 2,000 Units by mouth daily with lunch.   . denosumab (PROLIA) 60 MG/ML SOSY injection Inject 60 mg into the skin every 6 (six) months.  . Dulaglutide (TRULICITY) 3 GU/5.4YH SOPN Inject 3 mg into the skin once a week.  . estradiol (ESTRACE) 0.1 MG/GM vaginal cream   . ferrous sulfate 325 (65 FE) MG tablet Take 325 mg by mouth daily.  Marland Kitchen gabapentin (NEURONTIN) 100 MG capsule Take 1 capsule (100 mg total) by mouth 3 (three) times daily.  . insulin glargine, 1 Unit Dial, (TOUJEO) 300 UNIT/ML Solostar Pen Inject 17 Units into the skin daily.   Marland Kitchen ipratropium-albuterol (DUONEB) 0.5-2.5 (3) MG/3ML SOLN Take 3 mLs by nebulization 4 (four) times daily as needed (wheezing/shortness of breath).   Marland Kitchen lisinopril (ZESTRIL) 20 MG tablet Take 1 tablet (20 mg total) by mouth daily.  . Magnesium Oxide 400 (240 Mg) MG TABS Take 2 tablets (800 mg total) by mouth 2 (two) times daily.  . Menthol-Methyl Salicylate (SALONPAS PAIN RELIEF PATCH) PTCH Apply 1 patch topically daily as needed (pain.).  Marland Kitchen methocarbamol  (ROBAXIN) 500 MG tablet Take 500 mg by mouth every 8 (eight) hours as needed for muscle spasms.  . ondansetron (ZOFRAN-ODT) 8 MG disintegrating tablet Take 8 mg by mouth every 8 (eight) hours as needed for nausea or vomiting.   Marland Kitchen oxyCODONE (OXY IR/ROXICODONE) 5 MG immediate release tablet Take 1 tablet (5 mg total) by mouth every 6 (six) hours as needed for severe pain.  . pantoprazole (PROTONIX) 40 MG tablet Take 1 tablet (40 mg total) by mouth every morning.  . polyethylene  glycol (MIRALAX / GLYCOLAX) 17 g packet Take 17 g by mouth as needed.   . senna-docusate (SENOKOT-S) 8.6-50 MG tablet Take 1 tablet by mouth at bedtime.  . sodium chloride (OCEAN) 0.65 % SOLN nasal spray Place 1-2 sprays into both nostrils 4 (four) times daily as needed for congestion.  Marland Kitchen tolterodine (DETROL LA) 2 MG 24 hr capsule Take 2 mg by mouth at bedtime.   . traMADol (ULTRAM) 50 MG tablet Take 1 tablet (50 mg total) by mouth every 6 (six) hours as needed for moderate pain.  . traZODone (DESYREL) 50 MG tablet Take 50 mg by mouth.  . warfarin (COUMADIN) 1 MG tablet Take 0.5 mg by mouth See admin instructions. Take 0.5 tablet (0.5 mg) with a 5 mg tablet on Sundays, Tuesdays, Thursdays, Saturdays.  Marland Kitchen warfarin (COUMADIN) 5 MG tablet Take 5 mg by mouth daily.  . [DISCONTINUED] furosemide (LASIX) 20 MG tablet Take 1 tablet (20 mg total) by mouth 2 (two) times daily.     Allergies:   Ketek [telithromycin], Loxapine succinate, Naproxen sodium, Amoxapine and related, Darvon, Belsomra [suvorexant], Cymbalta [duloxetine hcl], Gabapentin, Amoxicillin, and Propoxyphene   Social History   Socioeconomic History  . Marital status: Married    Spouse name: [name]  . Number of children: Not on file  . Years of education: Not on file  . Highest education level: Not on file  Occupational History  . Not on file  Tobacco Use  . Smoking status: Never Smoker  . Smokeless tobacco: Never Used  Vaping Use  . Vaping Use: Never used   Substance and Sexual Activity  . Alcohol use: No  . Drug use: No  . Sexual activity: Not on file  Other Topics Concern  . Not on file  Social History Narrative   Lives with spouse in [place].  2 grown daughters.   Retired Glass blower/designer     Social Determinants of Radio broadcast assistant Strain: Not on file  Food Insecurity: Not on file  Transportation Needs: Not on file  Physical Activity: Not on file  Stress: Not on file  Social Connections: Not on file     Family History: The patient's family history includes Antithrombin III deficiency in her mother; Cirrhosis in her mother; Coronary artery disease in her father; Heart attack in her brother, father, and sister; Hypertension in her brother, father, and sister; Lung cancer in her brother; Protein S deficiency in her daughter and sister; Ulcerative colitis in her mother. There is no history of Stroke, Colitis, Colon polyps, Esophageal cancer, Liver cancer, Pancreatic cancer, Rectal cancer, or Stomach cancer.  ROS:   Review of Systems  Constitution: Negative for decreased appetite, fever and weight gain.  HENT: Negative for congestion, ear discharge, hoarse voice and sore throat.   Eyes: Negative for discharge, redness, vision loss in right eye and visual halos.  Cardiovascular: Negative for chest pain, dyspnea on exertion, leg swelling, orthopnea and palpitations.  Respiratory: Negative for cough, hemoptysis, shortness of breath and snoring.   Endocrine: Negative for heat intolerance and polyphagia.  Hematologic/Lymphatic: Negative for bleeding problem. Does not bruise/bleed easily.  Skin: Negative for flushing, nail changes, rash and suspicious lesions.  Musculoskeletal: Negative for arthritis, joint pain, muscle cramps, myalgias, neck pain and stiffness.  Gastrointestinal: Negative for abdominal pain, bowel incontinence, diarrhea and excessive appetite.  Genitourinary: Negative for decreased libido, genital sores and  incomplete emptying.  Neurological: Negative for brief paralysis, focal weakness, headaches and loss of balance.  Psychiatric/Behavioral:  Negative for altered mental status, depression and suicidal ideas.  Allergic/Immunologic: Negative for HIV exposure and persistent infections.    EKGs/Labs/Other Studies Reviewed:    The following studies were reviewed today:   EKG: None today.   EchocardiogramIMPRESSIONS  1. Left ventricular ejection fraction, by estimation, is 60 to 65%. The left ventricle has normal function. The left ventricle has no regional wall motion abnormalities. There is moderate concentric left ventricular hypertrophy. Left ventricular  diastolic parameters are consistent with Grade I diastolic dysfunction (impaired relaxation).  2. Right ventricular systolic function is normal. The right ventricular size is normal. There is normal pulmonary artery systolic pressure.  3. Left atrial size was mildly dilated.  4. The mitral valve is normal in structure. Trivial mitral valve regurgitation. No evidence of mitral stenosis.  5. The aortic valve is tricuspid. Aortic valve regurgitation is not visualized. No aortic stenosis is present.  6. The inferior vena cava is normal in size with greater than 50% respiratory variability, suggesting right atrial pressure of 3 mmHg.   Recent Labs: 11/14/2019: TSH 2.020 03/10/2020: Pro B Natriuretic peptide (BNP) 135.0 07/24/2020: Hemoglobin 12.5; Platelets 268 08/13/2020: ALT 44; BUN 19; Creatinine, Ser 1.19; Potassium 4.5; Sodium 137  Recent Lipid Panel    Component Value Date/Time   CHOL 104 07/24/2020 1111   TRIG 62 07/24/2020 1111   HDL 42 07/24/2020 1111   CHOLHDL 2.5 07/24/2020 1111   LDLCALC 48 07/24/2020 1111    Physical Exam:    VS:  BP (!) 100/50 (BP Location: Right Arm, Patient Position: Sitting, Cuff Size: Normal)   Pulse 66   Ht 5\' 3"  (1.6 m)   Wt 177 lb (80.3 kg)   SpO2 97%   BMI 31.35 kg/m     Wt Readings  from Last 3 Encounters:  08/14/20 177 lb (80.3 kg)  08/04/20 172 lb 14.4 oz (78.4 kg)  07/29/20 174 lb 12.8 oz (79.3 kg)     GEN: Well nourished, well developed in no acute distress HEENT: Normal NECK: No JVD; No carotid bruits LYMPHATICS: No lymphadenopathy CARDIAC: S1S2 noted,RRR, no murmurs, rubs, gallops RESPIRATORY:  Clear to auscultation without rales, wheezing or rhonchi  ABDOMEN: Soft, non-tender, non-distended, +bowel sounds, no guarding. EXTREMITIES: No edema, No cyanosis, no clubbing MUSCULOSKELETAL:  No deformity  SKIN: Warm and dry NEUROLOGIC:  Alert and oriented x 3, non-focal PSYCHIATRIC:  Normal affect, good insight  ASSESSMENT:    1. Ventricular premature beats   2. Paroxysmal atrial fibrillation (HCC)   3. Transient ischemic attack   4. Primary hypertension    PLAN:     Her blood pressure has improved.  But what I like to do is cut back on her Lasix from twice daily to once daily.  She is going to monitor her dizziness and she is going to discuss with her PCP to cut back on the gabapentin and see if her symptoms improve any.  If her symptoms does not improve off of the gabapentin we plan to place a monitor and patient to assess to make sure she is not experiencing any arrhythmias or pauses giving her history of paroxysmal atrial fibrillation.  I again reviewed her monitor which was done in March and appreciated premature ventricular complex which is asymptomatic however patient tells me she does not feel any palpitations just the fact that she feels dizzy.  She will continue her warfarin.  The patient is in agreement with the above plan. The patient left the office in stable condition.  The patient will follow up in 4 weeks or sooner if needed.   Medication Adjustments/Labs and Tests Ordered: Current medicines are reviewed at length with the patient today.  Concerns regarding medicines are outlined above.  No orders of the defined types were placed in this  encounter.  Meds ordered this encounter  Medications  . furosemide (LASIX) 20 MG tablet    Sig: Take 1 tablet (20 mg total) by mouth daily.    Dispense:  90 tablet    Refill:  3    Patient Instructions  Medication Instructions:  Your physician has recommended you make the following change in your medication: START: Lasix 20 mg take one tablet by mouth daily  *If you need a refill on your cardiac medications before your next appointment, please call your pharmacy*   Lab Work: None If you have labs (blood work) drawn today and your tests are completely normal, you will receive your results only by: Marland Kitchen MyChart Message (if you have MyChart) OR . A paper copy in the mail If you have any lab test that is abnormal or we need to change your treatment, we will call you to review the results.   Testing/Procedures: None   Follow-Up: At [facility], you and your health needs are our priority.  As part of our continuing mission to provide you with exceptional heart care, we have created designated Provider Care Teams.  These Care Teams include your primary Cardiologist (physician) and Advanced Practice Providers (APPs -  Physician Assistants and Nurse Practitioners) who all work together to provide you with the care you need, when you need it.  We recommend signing up for the patient portal called "MyChart".  Sign up information is provided on this After Visit Summary.  MyChart is used to connect with patients for Virtual Visits (Telemedicine).  Patients are able to view lab/test results, encounter notes, upcoming appointments, etc.  Non-urgent messages can be sent to your provider as well.   To learn more about what you can do with MyChart, go to [email or web address].    Your next appointment:   4 week(s)  The format for your next appointment:   In Person  Provider:   Dr. Harriet Masson   Other Instructions      Adopting a Healthy Lifestyle.  Know what a healthy weight is for you  (roughly BMI <25) and aim to maintain this   Aim for 7+ servings of fruits and vegetables daily   65-80+ fluid ounces of water or unsweet tea for healthy kidneys   Limit to max 1 drink of alcohol per day; avoid smoking/tobacco   Limit animal fats in diet for cholesterol and heart health - choose grass fed whenever available   Avoid highly processed foods, and foods high in saturated/trans fats   Aim for low stress - take time to unwind and care for your mental health   Aim for 150 min of moderate intensity exercise weekly for heart health, and weights twice weekly for bone health   Aim for 7-9 hours of sleep daily   When it comes to diets, agreement about the perfect plan isnt easy to find, even among the experts. Experts at the [employer] developed an idea known as the Healthy Eating Plate. Just imagine a plate divided into logical, healthy portions.   The emphasis is on diet quality:   Load up on vegetables and fruits - one-half of your plate: Aim for color and variety, and remember that potatoes  dont count.   Go for whole grains - one-quarter of your plate: Whole wheat, barley, wheat berries, quinoa, oats, brown rice, and foods made with them. If you want pasta, go with whole wheat pasta.   Protein power - one-quarter of your plate: Fish, chicken, beans, and nuts are all healthy, versatile protein sources. Limit red meat.   The diet, however, does go beyond the plate, offering a few other suggestions.   Use healthy plant oils, such as olive, canola, soy, corn, sunflower and peanut. Check the labels, and avoid partially hydrogenated oil, which have unhealthy trans fats.   If youre thirsty, drink water. Coffee and tea are good in moderation, but skip sugary drinks and limit milk and dairy products to one or two daily servings.   The type of carbohydrate in the diet is more important than the amount. Some sources of carbohydrates, such as vegetables, fruits,  whole grains, and beans-are healthier than others.   Finally, stay active  Signed, Berniece Salines, DO  08/14/2020 2:26 PM    [facility]

## 2020-08-14 NOTE — Patient Instructions (Signed)
Medication Instructions:  Your physician has recommended you make the following change in your medication: START: Lasix 20 mg take one tablet by mouth daily  *If you need a refill on your cardiac medications before your next appointment, please call your pharmacy*   Lab Work: None If you have labs (blood work) drawn today and your tests are completely normal, you will receive your results only by: Marland Kitchen MyChart Message (if you have MyChart) OR . A paper copy in the mail If you have any lab test that is abnormal or we need to change your treatment, we will call you to review the results.   Testing/Procedures: None   Follow-Up: At [facility], you and your health needs are our priority.  As part of our continuing mission to provide you with exceptional heart care, we have created designated Provider Care Teams.  These Care Teams include your primary Cardiologist (physician) and Advanced Practice Providers (APPs -  Physician Assistants and Nurse Practitioners) who all work together to provide you with the care you need, when you need it.  We recommend signing up for the patient portal called "MyChart".  Sign up information is provided on this After Visit Summary.  MyChart is used to connect with patients for Virtual Visits (Telemedicine).  Patients are able to view lab/test results, encounter notes, upcoming appointments, etc.  Non-urgent messages can be sent to your provider as well.   To learn more about what you can do with MyChart, go to [email or web address].    Your next appointment:   4 week(s)  The format for your next appointment:   In Person  Provider:   Dr. Harriet Masson   Other Instructions

## 2020-08-17 ENCOUNTER — Telehealth: Payer: Self-pay

## 2020-08-17 NOTE — Telephone Encounter (Signed)
Pamela [name] reports that she had another syncopal episode and was seen by Dr. Blanch Media (cardiology) who advised that she discuss stopping her Gabapentin with [name].  She was started on the gabapentin in late Nov.  [name] advised that she stop the medication and call us back with any problems.

## 2020-08-18 ENCOUNTER — Other Ambulatory Visit: Payer: Self-pay

## 2020-08-18 ENCOUNTER — Encounter: Payer: Self-pay | Admitting: Oncology

## 2020-08-18 ENCOUNTER — Ambulatory Visit (INDEPENDENT_AMBULATORY_CARE_PROVIDER_SITE_OTHER): Payer: Medicare PPO

## 2020-08-18 ENCOUNTER — Telehealth: Payer: Self-pay

## 2020-08-18 ENCOUNTER — Telehealth: Payer: Self-pay | Admitting: Cardiology

## 2020-08-18 DIAGNOSIS — R55 Syncope and collapse: Secondary | ICD-10-CM | POA: Diagnosis not present

## 2020-08-18 MED ORDER — DULAGLUTIDE 4.5 MG/0.5ML ~~LOC~~ SOAJ
4.5000 mg | SUBCUTANEOUS | 2 refills | Status: DC
Start: 2020-08-18 — End: 2020-10-15

## 2020-08-18 MED ORDER — LISINOPRIL 20 MG PO TABS
10.0000 mg | ORAL_TABLET | Freq: Every day | ORAL | 0 refills | Status: DC
Start: 2020-08-18 — End: 2020-09-11

## 2020-08-18 NOTE — Telephone Encounter (Signed)
Pamela [name] was notified of her lab results.  She reported having 2 syncopal episodes in the last 72 hours.  She has been able to sit down before falling with no injury reported.  She stopped the gabapentin as directed per [name].  [name] advised that she notify Dr. Harriet Masson of her syncopal episodes since she may opt to proceed with a holter monitor.

## 2020-08-18 NOTE — Telephone Encounter (Signed)
STAT if patient feels like he/she is going to faint   1) Are you dizzy now? No. Pt is sitting still  2) Do you feel faint or have you passed out? Pt fainted yesterday  3) Do you have any other symptoms? no  4) Have you checked your HR and BP (record if available)? no  Pt was told by Dr. Harriet Masson that the next time she passed out to let Dr. Harriet Masson know and she would think about ordering the patient a monitor to wear. The patient fainted yesterday and wants to know what to do next. Please advise

## 2020-08-18 NOTE — Telephone Encounter (Signed)
Monitor has been placed on the pt.

## 2020-08-18 NOTE — Telephone Encounter (Signed)
Spoke to the patient just now and let her know that Dr. Harriet Masson would like for her to wear this monitor for 14 days. I advised that I could have this mailed to her or she could come to the office to have it put on and she states that she would like for Pamela [name] to help her put it on. She states that she will come by today around 4 pm to get this done.   I will route to [place] as they are in the [place] office today.

## 2020-08-18 NOTE — Telephone Encounter (Signed)
Please send a monitor to the patient for 14 days, zio life.  Or we can have her come up to the office and we can apply it to her.

## 2020-08-19 ENCOUNTER — Encounter: Payer: Self-pay | Admitting: Oncology

## 2020-08-19 DIAGNOSIS — M25572 Pain in left ankle and joints of left foot: Secondary | ICD-10-CM | POA: Diagnosis not present

## 2020-08-20 DIAGNOSIS — Z20822 Contact with and (suspected) exposure to covid-19: Secondary | ICD-10-CM | POA: Diagnosis not present

## 2020-08-24 ENCOUNTER — Encounter: Payer: Self-pay | Admitting: Oncology

## 2020-08-25 DIAGNOSIS — J449 Chronic obstructive pulmonary disease, unspecified: Secondary | ICD-10-CM | POA: Diagnosis not present

## 2020-08-25 DIAGNOSIS — Z1152 Encounter for screening for COVID-19: Secondary | ICD-10-CM | POA: Diagnosis not present

## 2020-08-26 ENCOUNTER — Telehealth: Payer: Self-pay | Admitting: Hematology and Oncology

## 2020-08-26 NOTE — Telephone Encounter (Signed)
Telephoned patient to discuss Coumadin dosing. Coumadin has been on hold for 2 days due to INR 4.4. Patient states prior to that she was taking 3mg  M,F and 5mg  rest of days. INR now therapeutic at 2.1. Will resume Coumadin, but at 5mg  M,W,F and 3mg  rest of days and repeat INR on 09/02/20.Marland Kitchen Patient verbalizes understanding.

## 2020-08-26 NOTE — Telephone Encounter (Signed)
Late entry from 08/24/20: Patient's INR 4.4. She states she is taking Coumadin 3mg  M,W,F and 5mg  rest of days. She was instructed to hold Coumadin for 2 days, then repeat INR.

## 2020-08-30 DIAGNOSIS — Z20822 Contact with and (suspected) exposure to covid-19: Secondary | ICD-10-CM | POA: Diagnosis not present

## 2020-09-02 DIAGNOSIS — N390 Urinary tract infection, site not specified: Secondary | ICD-10-CM | POA: Diagnosis not present

## 2020-09-02 DIAGNOSIS — R112 Nausea with vomiting, unspecified: Secondary | ICD-10-CM | POA: Diagnosis not present

## 2020-09-02 DIAGNOSIS — B9689 Other specified bacterial agents as the cause of diseases classified elsewhere: Secondary | ICD-10-CM | POA: Diagnosis not present

## 2020-09-02 DIAGNOSIS — R079 Chest pain, unspecified: Secondary | ICD-10-CM | POA: Diagnosis not present

## 2020-09-02 DIAGNOSIS — R0781 Pleurodynia: Secondary | ICD-10-CM | POA: Diagnosis not present

## 2020-09-06 NOTE — Progress Notes (Signed)
Labs completed

## 2020-09-08 DIAGNOSIS — M542 Cervicalgia: Secondary | ICD-10-CM | POA: Diagnosis not present

## 2020-09-08 DIAGNOSIS — R55 Syncope and collapse: Secondary | ICD-10-CM | POA: Diagnosis not present

## 2020-09-08 DIAGNOSIS — D6859 Other primary thrombophilia: Secondary | ICD-10-CM | POA: Diagnosis not present

## 2020-09-08 DIAGNOSIS — M4316 Spondylolisthesis, lumbar region: Secondary | ICD-10-CM | POA: Diagnosis not present

## 2020-09-11 ENCOUNTER — Other Ambulatory Visit: Payer: Self-pay

## 2020-09-11 ENCOUNTER — Ambulatory Visit (INDEPENDENT_AMBULATORY_CARE_PROVIDER_SITE_OTHER): Payer: Medicare PPO | Admitting: Cardiology

## 2020-09-11 ENCOUNTER — Encounter: Payer: Self-pay | Admitting: Cardiology

## 2020-09-11 ENCOUNTER — Telehealth: Payer: Self-pay

## 2020-09-11 VITALS — BP 134/70 | HR 70 | Ht 60.0 in | Wt 174.0 lb

## 2020-09-11 DIAGNOSIS — Z7901 Long term (current) use of anticoagulants: Secondary | ICD-10-CM | POA: Diagnosis not present

## 2020-09-11 DIAGNOSIS — I10 Essential (primary) hypertension: Secondary | ICD-10-CM | POA: Diagnosis not present

## 2020-09-11 DIAGNOSIS — R079 Chest pain, unspecified: Secondary | ICD-10-CM | POA: Diagnosis not present

## 2020-09-11 DIAGNOSIS — I480 Paroxysmal atrial fibrillation: Secondary | ICD-10-CM | POA: Diagnosis not present

## 2020-09-11 MED ORDER — LISINOPRIL 20 MG PO TABS: 20.0000 mg | ORAL_TABLET | Freq: Every day | ORAL | 0 refills | Status: DC

## 2020-09-11 NOTE — Progress Notes (Signed)
Cardiology Office Note:    Date:  09/11/2020   ID:  Pamela [name], DOB Aug 28, 1946, MRN [id number]  PCP:  [name], MD  Cardiologist:  Berniece Salines, DO  Electrophysiologist:  None   Referring MD: [name], MD   " I was recently in the hospital for chest pain"  History of Present Illness:    Pamela [name] is a 75 y.o. female with a hx of Diabetes Mellitus, GERD, protein S deficiency chronic anticoagulation with Coumadinand has had DVT in the past,hypertension, hypercholesteremia, PVC, Paroxysmal atrial fibrillation history of a left heart catheterization with normal coronaries, history of TIA.She had a ORIF ankle fracture in June and status post open reduction internal fixation.  I last saw the patient in November 2021 at that time she was experiencing significant elevated blood pressure.  We discussed blood pressure control at that time I increased her lisinopril to 5 mg daily and she remain on her carvedilol 25 mg twice daily and her Lasix was twice daily as well.  I saw the patient on August 14, 2020 at that time she reports she has been experiencing some dizziness therefore I placed a monitor on the patient making sure that the symptoms were not caused due to an arrhythmia.  I also cut back on her Lasix from twice a day to once daily.  In the interim the patient did wear the monitor.  She is here today for follow-up visit.  She also tells me she ended up in the hospital at [facility] recently given the fact that she experiencing left-sided chest discomfort.  She described it was more in her lower ribs area and it sounded pleuritic.  She had a CTA of the chest this was normal.  But she has had intermittent chest discomfort ever since she is left the hospital.  Nothing makes it worse or better.  Of note medical records from [place] was reviewed by me she did have a CTA chest which did not show any evidence of pulmonary embolism.  Past Medical  History:  Diagnosis Date  . Anemia   . Asthma   . Atrial fibrillation [I48.91] 10/01/2014  . Bilateral leg cramps   . Chronic anticoagulation   . Chronic constipation   . Closed bimalleolar fracture of left ankle 02/26/2020  . Complication of anesthesia   . Deep venous thrombosis ([place]) 07/30/2013  . Diabetes mellitus   . Dizziness 05/23/2011  . DJD (degenerative joint disease)   . DOE (dyspnea on exertion) 03/03/2013   Onset early 2020  - 02/05/2019   Walked RA  2 laps @  approx 275ft each @ nl pace  stopped due to  Light headed, no sp or sob, no desats    . DVT (deep venous thrombosis) ([place]) 07/30/2013  . Dyspnea    occasionally  . Dyspnea on exertion 03/03/2013   Onset early 2020  - 02/05/2019   Walked RA  2 laps @  approx 286ft each @ nl pace  stopped due to  Light headed, no sp or sob, no desats    . Encounter for orthopedic follow-up care 03/10/2020  . GERD (gastroesophageal reflux disease)   . Heart murmur    as a child  . HTN (hypertension)   . Hypercholesteremia   . Hypertension 05/23/2011  . Hyponatremia 02/06/2019   Na 126 on no obvious meds to cause it with last na 133 in 2014   . Long term (current) use of anticoagulants 03/03/2013  .  Low back pain 07/06/2018  . Low back strain 08/09/2018  . Neck pain 06/11/2020  . Obesity   . Osteoarthritis of right knee 07/06/2018  . Pain in right knee 07/06/2018  . Pneumonia   . PONV (postoperative nausea and vomiting)    needs pre-medication  . Protein S deficiency ([place])    prior DVT L knee, L arm DVT,  followed by Dr West Pugh at [facility]  . PVC's (premature ventricular contractions)   . S/P cardiac catheterization 02/2013   Normal coronaries; low normal EF at 50%  . Sleep apnea    can't use the cpap (diagnosed 2017)  . Solitary pulmonary nodule on lung CT 02/06/2019   First noted 01/13/2014 > no change as of 12/21/2018  . Spondylolisthesis, lumbar region 08/09/2018  . Stroke [facility])    TIA in 2018 or 2019  . TIA (transient ischemic attack)   .  Transient ischemic attack 07/30/2013  . Ventricular premature beats 05/23/2011    Past Surgical History:  Procedure Laterality Date  . CARPAL TUNNEL RELEASE  1994   bilateral  . CHOLECYSTECTOMY  1993  . COLONOSCOPY    . FOOT TENDON SURGERY Right   . left knee replacement  2006  . ORIF ANKLE FRACTURE Left 02/26/2020   Procedure: OPEN REDUCTION INTERNAL FIXATION (ORIF) ANKLE FRACTURE;  Surgeon: Nicholes Stairs, MD;  Location: [place];  Service: Orthopedics;  Laterality: Left;  90 mins  . thumb surgery Left 2018  . TONSILLECTOMY    . TOTAL ABDOMINAL HYSTERECTOMY  1988    Current Medications: Current Meds  Medication Sig  . acetaminophen (TYLENOL) 500 MG tablet Take 500-1,000 mg by mouth every 6 (six) hours as needed (for pain.).  Marland Kitchen albuterol (VENTOLIN HFA) 108 (90 Base) MCG/ACT inhaler Inhale 1-2 puffs into the lungs every 6 (six) hours as needed for wheezing or shortness of breath.   Marland Kitchen ascorbic acid (VITAMIN C) 500 MG tablet Take 500 mg by mouth.  Marland Kitchen aspirin EC 81 MG tablet Take 1 tablet (81 mg total) by mouth daily.  Marland Kitchen atorvastatin (LIPITOR) 40 MG tablet Take 1 tablet (40 mg total) by mouth daily at 12 noon.  Marland Kitchen azelastine (ASTELIN) 137 MCG/SPRAY nasal spray Place 2 sprays into the nose daily as needed for rhinitis or allergies.   . B Complex-C (B-COMPLEX WITH VITAMIN C) tablet Take 1 tablet by mouth daily with lunch.  . carvedilol (COREG) 25 MG tablet Take 1 tablet (25 mg total) by mouth 2 (two) times daily with a meal.  . Cholecalciferol (VITAMIN D3) 50 MCG (2000 UT) TABS Take 2,000 Units by mouth daily with lunch.   . denosumab (PROLIA) 60 MG/ML SOSY injection Inject 60 mg into the skin every 6 (six) months.  . Dulaglutide 4.5 MG/0.5ML SOPN Inject 4.5 mg into the skin once a week.  . estradiol (ESTRACE) 0.1 MG/GM vaginal cream   . ferrous sulfate 325 (65 FE) MG tablet Take 325 mg by mouth daily.  . furosemide (LASIX) 20 MG tablet Take 1 tablet (20 mg total) by mouth daily.  Marland Kitchen  gabapentin (NEURONTIN) 100 MG capsule Take 1 capsule (100 mg total) by mouth 3 (three) times daily.  . insulin glargine, 1 Unit Dial, (TOUJEO) 300 UNIT/ML Solostar Pen Inject 17 Units into the skin daily.   Marland Kitchen ipratropium-albuterol (DUONEB) 0.5-2.5 (3) MG/3ML SOLN Take 3 mLs by nebulization 4 (four) times daily as needed (wheezing/shortness of breath).   . Magnesium Oxide 400 (240 Mg) MG TABS Take 2 tablets (  800 mg total) by mouth 2 (two) times daily.  . Menthol-Methyl Salicylate (SALONPAS PAIN RELIEF PATCH) PTCH Apply 1 patch topically daily as needed (pain.).  Marland Kitchen methocarbamol (ROBAXIN) 500 MG tablet Take 500 mg by mouth every 8 (eight) hours as needed for muscle spasms.  . ondansetron (ZOFRAN-ODT) 8 MG disintegrating tablet Take 8 mg by mouth every 8 (eight) hours as needed for nausea or vomiting.   Marland Kitchen oxyCODONE (OXY IR/ROXICODONE) 5 MG immediate release tablet Take 1 tablet (5 mg total) by mouth every 6 (six) hours as needed for severe pain.  . pantoprazole (PROTONIX) 40 MG tablet Take 1 tablet (40 mg total) by mouth every morning.  . polyethylene glycol (MIRALAX / GLYCOLAX) 17 g packet Take 17 g by mouth as needed.   . senna-docusate (SENOKOT-S) 8.6-50 MG tablet Take 1 tablet by mouth at bedtime.  . sodium chloride (OCEAN) 0.65 % SOLN nasal spray Place 1-2 sprays into both nostrils 4 (four) times daily as needed for congestion.  Marland Kitchen tolterodine (DETROL LA) 2 MG 24 hr capsule Take 2 mg by mouth at bedtime.   . traMADol (ULTRAM) 50 MG tablet Take 1 tablet (50 mg total) by mouth every 6 (six) hours as needed for moderate pain.  . traZODone (DESYREL) 50 MG tablet Take 50 mg by mouth.  . warfarin (COUMADIN) 1 MG tablet Take 1 tablet (1 mg total) by mouth See admin instructions. Take as directed  . warfarin (COUMADIN) 5 MG tablet Take 5 mg by mouth daily.  . [DISCONTINUED] lisinopril (ZESTRIL) 20 MG tablet Take 0.5 tablets (10 mg total) by mouth daily.     Allergies:   Ketek [telithromycin], Loxapine  succinate, Naproxen sodium, Amoxapine and related, Darvon, Belsomra [suvorexant], Cymbalta [duloxetine hcl], Gabapentin, Amoxicillin, and Propoxyphene   Social History   Socioeconomic History  . Marital status: Married    Spouse name: [name]  . Number of children: Not on file  . Years of education: Not on file  . Highest education level: Not on file  Occupational History  . Not on file  Tobacco Use  . Smoking status: Never Smoker  . Smokeless tobacco: Never Used  Vaping Use  . Vaping Use: Never used  Substance and Sexual Activity  . Alcohol use: No  . Drug use: No  . Sexual activity: Not on file  Other Topics Concern  . Not on file  Social History Narrative   Lives with spouse in [place].  2 grown daughters.   Retired Glass blower/designer     Social Determinants of Radio broadcast assistant Strain: Not on file  Food Insecurity: Not on file  Transportation Needs: Not on file  Physical Activity: Not on file  Stress: Not on file  Social Connections: Not on file     Family History: The patient's family history includes Antithrombin III deficiency in her mother; Cirrhosis in her mother; Coronary artery disease in her father; Heart attack in her brother, father, and sister; Hypertension in her brother, father, and sister; Lung cancer in her brother; Protein S deficiency in her daughter and sister; Ulcerative colitis in her mother. There is no history of Stroke, Colitis, Colon polyps, Esophageal cancer, Liver cancer, Pancreatic cancer, Rectal cancer, or Stomach cancer.  ROS:   Review of Systems  Constitution: Negative for decreased appetite, fever and weight gain.  HENT: Negative for congestion, ear discharge, hoarse voice and sore throat.   Eyes: Negative for discharge, redness, vision loss in right eye and visual halos.  Cardiovascular:  Negative for chest pain, dyspnea on exertion, leg swelling, orthopnea and palpitations.  Respiratory: Negative for cough, hemoptysis, shortness  of breath and snoring.   Endocrine: Negative for heat intolerance and polyphagia.  Hematologic/Lymphatic: Negative for bleeding problem. Does not bruise/bleed easily.  Skin: Negative for flushing, nail changes, rash and suspicious lesions.  Musculoskeletal: Negative for arthritis, joint pain, muscle cramps, myalgias, neck pain and stiffness.  Gastrointestinal: Negative for abdominal pain, bowel incontinence, diarrhea and excessive appetite.  Genitourinary: Negative for decreased libido, genital sores and incomplete emptying.  Neurological: Negative for brief paralysis, focal weakness, headaches and loss of balance.  Psychiatric/Behavioral: Negative for altered mental status, depression and suicidal ideas.  Allergic/Immunologic: Negative for HIV exposure and persistent infections.    EKGs/Labs/Other Studies Reviewed:    The following studies were reviewed today:   EKG: None today  EchocardiogramIMPRESSIONS  1. Left ventricular ejection fraction, by estimation, is 60 to 65%. The left ventricle has normal function. The left ventricle has no regional wall motion abnormalities. There is moderate concentric left ventricular hypertrophy. Left ventricular  diastolic parameters are consistent with Grade I diastolic dysfunction (impaired relaxation).  2. Right ventricular systolic function is normal. The right ventricular size is normal. There is normal pulmonary artery systolic pressure.  3. Left atrial size was mildly dilated.  4. The mitral valve is normal in structure. Trivial mitral valve regurgitation. No evidence of mitral stenosis.  5. The aortic valve is tricuspid. Aortic valve regurgitation is not visualized. No aortic stenosis is present.  6. The inferior vena cava is normal in size with greater than 50% respiratory variability, suggesting right atrial pressure of 3 mmHg.   Recent Labs: 11/14/2019: TSH 2.020 03/10/2020: Pro B Natriuretic peptide (BNP) 135.0 07/24/2020:  Hemoglobin 12.5; Platelets 268 08/13/2020: ALT 44; BUN 19; Creatinine, Ser 1.19; Potassium 4.5; Sodium 137  Recent Lipid Panel    Component Value Date/Time   CHOL 104 07/24/2020 1111   TRIG 62 07/24/2020 1111   HDL 42 07/24/2020 1111   CHOLHDL 2.5 07/24/2020 1111   LDLCALC 48 07/24/2020 1111    Physical Exam:    VS:  BP 134/70   Pulse 70   Ht 5' (1.524 m)   Wt 174 lb (78.9 kg)   SpO2 97%   BMI 33.98 kg/m     Wt Readings from Last 3 Encounters:  09/11/20 174 lb (78.9 kg)  08/14/20 177 lb (80.3 kg)  08/04/20 172 lb 14.4 oz (78.4 kg)     GEN: Well nourished, well developed in no acute distress HEENT: Normal NECK: No JVD; No carotid bruits LYMPHATICS: No lymphadenopathy CARDIAC: S1S2 noted,RRR, no murmurs, rubs, gallops RESPIRATORY:  Clear to auscultation without rales, wheezing or rhonchi  ABDOMEN: Soft, non-tender, non-distended, +bowel sounds, no guarding. EXTREMITIES: No edema, No cyanosis, no clubbing MUSCULOSKELETAL:  No deformity  SKIN: Warm and dry NEUROLOGIC:  Alert and oriented x 3, non-focal PSYCHIATRIC:  Normal affect, good insight  ASSESSMENT:    1. Chest pain of uncertain etiology   2. Primary hypertension   3. Paroxysmal atrial fibrillation (HCC)   4. Chronic anticoagulation    PLAN:     1.  Her chest discomfort is concerning the patient has intermediate risk for coronary artery disease.  She had a nuclear stress test back in 2020 which was reported to be normal.  I would like to proceed an ischemic evaluation in this patient.  Shared decision coronary CTA would be appropriate at this time.  I have educated patient about this  and she is agreeable to proceed with this.  2.  Her dizziness has resolved.  I did report and discussed with the patient preliminary her ZIO monitor results.  This result is still yet to be dictated as there is difficulty downloading the report.  3. her blood pressure is elevated in the office, she also tells me at home her blood  pressures averaging in the 160s and 170s which she brought the information with her today I was able to review this and verify.  I am going to increase her lisinopril to 20 mg daily.  4. blood work will be done today for BMP, mag, CBC.  5. she does have questions about holding her warfarin for surgery.  I have discussed with the patient and this actually should be addressed by her hematologist oncologist given her history of factor V Leiden and significantly increased risk of stroke of her anticoagulation.  She is going to call his office today.   The patient is in agreement with the above plan. The patient left the office in stable condition.  The patient will follow up in 2 weeks virtually for blood pressure follow-up.   Medication Adjustments/Labs and Tests Ordered: Current medicines are reviewed at length with the patient today.  Concerns regarding medicines are outlined above.  Orders Placed This Encounter  Procedures  . CT CORONARY MORPH W/CTA COR W/SCORE W/CA W/CM &/OR WO/CM  . CT CORONARY FRACTIONAL FLOW RESERVE DATA PREP  . CT CORONARY FRACTIONAL FLOW RESERVE FLUID ANALYSIS  . Basic metabolic panel  . Magnesium  . CBC   Meds ordered this encounter  Medications  . lisinopril (ZESTRIL) 20 MG tablet    Sig: Take 1 tablet (20 mg total) by mouth daily.    Dispense:  90 tablet    Refill:  0    There are no Patient Instructions on file for this visit.   Adopting a Healthy Lifestyle.  Know what a healthy weight is for you (roughly BMI <25) and aim to maintain this   Aim for 7+ servings of fruits and vegetables daily   65-80+ fluid ounces of water or unsweet tea for healthy kidneys   Limit to max 1 drink of alcohol per day; avoid smoking/tobacco   Limit animal fats in diet for cholesterol and heart health - choose grass fed whenever available   Avoid highly processed foods, and foods high in saturated/trans fats   Aim for low stress - take time to unwind and care for your  mental health   Aim for 150 min of moderate intensity exercise weekly for heart health, and weights twice weekly for bone health   Aim for 7-9 hours of sleep daily   When it comes to diets, agreement about the perfect plan isnt easy to find, even among the experts. Experts at the [employer] developed an idea known as the Healthy Eating Plate. Just imagine a plate divided into logical, healthy portions.   The emphasis is on diet quality:   Load up on vegetables and fruits - one-half of your plate: Aim for color and variety, and remember that potatoes dont count.   Go for whole grains - one-quarter of your plate: Whole wheat, barley, wheat berries, quinoa, oats, brown rice, and foods made with them. If you want pasta, go with whole wheat pasta.   Protein power - one-quarter of your plate: Fish, chicken, beans, and nuts are all healthy, versatile protein sources. Limit red meat.   The  diet, however, does go beyond the plate, offering a few other suggestions.   Use healthy plant oils, such as olive, canola, soy, corn, sunflower and peanut. Check the labels, and avoid partially hydrogenated oil, which have unhealthy trans fats.   If youre thirsty, drink water. Coffee and tea are good in moderation, but skip sugary drinks and limit milk and dairy products to one or two daily servings.   The type of carbohydrate in the diet is more important than the amount. Some sources of carbohydrates, such as vegetables, fruits, whole grains, and beans-are healthier than others.   Finally, stay active  Signed, Berniece Salines, DO  09/11/2020 11:08 AM    [place]

## 2020-09-11 NOTE — Patient Instructions (Addendum)
Medication Instructions:  Your physician has recommended you make the following change in your medication:   INCREASE: Lisinopril to 20 mg daily    *If you need a refill on your cardiac medications before your next appointment, please call your pharmacy*   Lab Work: Your physician recommends that you return for lab work today: bmp, mg, cbc    Your physician recommends that you return for lab work 3-7 days before ct: bmp   If you have labs (blood work) drawn today and your tests are completely normal, you will receive your results only by: Marland Kitchen MyChart Message (if you have MyChart) OR . A paper copy in the mail If you have any lab test that is abnormal or we need to change your treatment, we will call you to review the results.   Testing/Procedures: Your cardiac CT will be scheduled at one of the below locations:   [facility] [address] [place], [place] [address] [phone]  [place] [address] [place], [place] [address] [phone]  If scheduled at [facility], please arrive at the [facility] main entrance of [facility] 30 minutes prior to test start time. Proceed to the [facility] Radiology Department (first floor) to check-in and test prep.  If scheduled at [facility], please arrive 15 mins early for check-in and test prep.  Please follow these instructions carefully (unless otherwise directed):    On the Night Before the Test: . Be sure to Drink plenty of water. . Do not consume any caffeinated/decaffeinated beverages or chocolate 12 hours prior to your test. . Do not take any antihistamines 12 hours prior to your test.  On the Day of the Test: . Drink plenty of water. Do not drink any water within one hour of the test. . Do not eat any food 4 hours prior to the test. . You may take your regular medications prior to the test.  . Take  carvedilol two hours prior to test. . HOLD Furosemide morning of the test. . HOLD INSULIN the morning of the test  . FEMALES- please wear underwire-free bra if available          After the Test: . Drink plenty of water. . After receiving IV contrast, you may experience a mild flushed feeling. This is normal. . On occasion, you may experience a mild rash up to 24 hours after the test. This is not dangerous. If this occurs, you can take Benadryl 25 mg and increase your fluid intake. . If you experience trouble breathing, this can be serious. If it is severe call 911 IMMEDIATELY. If it is mild, please call our office. . If you take any of these medications: Glipizide/Metformin, Avandament, Glucavance, please do not take 48 hours after completing test unless otherwise instructed.   Once we have confirmed authorization from your insurance company, we will call you to set up a date and time for your test. Based on how quickly your insurance processes prior authorizations requests, please allow up to 4 weeks to be contacted for scheduling your Cardiac CT appointment. Be advised that routine Cardiac CT appointments could be scheduled as many as 8 weeks after your Lonni Dirden has ordered it.  For non-scheduling related questions, please contact the cardiac imaging nurse navigator should you have any questions/concerns: Marchia Bond, Cardiac Imaging Nurse Navigator Burley Saver, Interim Cardiac Imaging Nurse Navigator [facility] Direct Office  Dial: [phone]   For scheduling needs, including cancellations and rescheduling, please call [place], [phone].     Follow-Up: At [facility], you and your health needs are our priority.  As part of our continuing mission to provide you with exceptional heart care, we have created designated Laporsche Hoeger Care Teams.  These Care Teams include your primary Cardiologist (physician) and Advanced Practice Providers (APPs -   Physician Assistants and Nurse Practitioners) who all work together to provide you with the care you need, when you need it.  We recommend signing up for the patient portal called "MyChart".  Sign up information is provided on this After Visit Summary.  MyChart is used to connect with patients for Virtual Visits (Telemedicine).  Patients are able to view lab/test results, encounter notes, upcoming appointments, etc.  Non-urgent messages can be sent to your Chidubem Chaires as well.   To learn more about what you can do with MyChart, go to [email or web address].    Your next appointment:   2 week(s)  The format for your next appointment:   Virtual Visit   Danyela Posas:   Berniece Salines, DO   Other Instructions    Cardiac CT Angiogram A cardiac CT angiogram is a procedure to look at the heart and the area around the heart. It may be done to help find the cause of chest pains or other symptoms of heart disease. During this procedure, a substance called contrast dye is injected into the blood vessels in the area to be checked. A large X-ray machine, called a CT scanner, then takes detailed pictures of the heart and the surrounding area. The procedure is also sometimes called a coronary CT angiogram, coronary artery scanning, or CTA. A cardiac CT angiogram allows the health care Carlina Derks to see how well blood is flowing to and from the heart. The health care Angelli Baruch will be able to see if there are any problems, such as:  Blockage or narrowing of the coronary arteries in the heart.  Fluid around the heart.  Signs of weakness or disease in the muscles, valves, and tissues of the heart. Tell a health care Mame Twombly about:  Any allergies you have. This is especially important if you have had a previous allergic reaction to contrast dye.  All medicines you are taking, including vitamins, herbs, eye drops, creams, and over-the-counter medicines.  Any blood disorders you have.  Any surgeries you have  had.  Any medical conditions you have.  Whether you are pregnant or may be pregnant.  Any anxiety disorders, chronic pain, or other conditions you have that may increase your stress or prevent you from lying still. What are the risks? Generally, this is a safe procedure. However, problems may occur, including:  Bleeding.  Infection.  Allergic reactions to medicines or dyes.  Damage to other structures or organs.  Kidney damage from the contrast dye that is used.  Increased risk of cancer from radiation exposure. This risk is low. Talk with your health care Ibrohim Simmers about: ? The risks and benefits of testing. ? How you can receive the lowest dose of radiation. What happens before the procedure?  Wear comfortable clothing and remove any jewelry, glasses, dentures, and hearing aids.  Follow instructions from your health care Rohen Kimes about eating and drinking. This may include: ? For 12 hours before the procedure - avoid caffeine. This includes tea, coffee, soda, energy drinks, and diet pills. Drink plenty of water or other fluids that do not have caffeine in them. Being well hydrated  can prevent complications. ? For 4-6 hours before the procedure - stop eating and drinking. The contrast dye can cause nausea, but this is less likely if your stomach is empty.  Ask your health care Douglas Rooks about changing or stopping your regular medicines. This is especially important if you are taking diabetes medicines, blood thinners, or medicines to treat problems with erections (erectile dysfunction). What happens during the procedure?   Hair on your chest may need to be removed so that small sticky patches called electrodes can be placed on your chest. These will transmit information that helps to monitor your heart during the procedure.  An IV will be inserted into one of your veins.  You might be given a medicine to control your heart rate during the procedure. This will help to ensure that  good images are obtained.  You will be asked to lie on an exam table. This table will slide in and out of the CT machine during the procedure.  Contrast dye will be injected into the IV. You might feel warm, or you may get a metallic taste in your mouth.  You will be given a medicine called nitroglycerin. This will relax or dilate the arteries in your heart.  The table that you are lying on will move into the CT machine tunnel for the scan.  The person running the machine will give you instructions while the scans are being done. You may be asked to: ? Keep your arms above your head. ? Hold your breath. ? Stay very still, even if the table is moving.  When the scanning is complete, you will be moved out of the machine.  The IV will be removed. The procedure may vary among health care providers and hospitals. What can I expect after the procedure? After your procedure, it is common to have:  A metallic taste in your mouth from the contrast dye.  A feeling of warmth.  A headache from the nitroglycerin. Follow these instructions at home:  Take over-the-counter and prescription medicines only as told by your health care Keenya Matera.  If you are told, drink enough fluid to keep your urine pale yellow. This will help to flush the contrast dye out of your body.  Most people can return to their normal activities right after the procedure. Ask your health care Kunta Hilleary what activities are safe for you.  It is up to you to get the results of your procedure. Ask your health care Taeveon Keesling, or the department that is doing the procedure, when your results will be ready.  Keep all follow-up visits as told by your health care Eragon Hammond. This is important. Contact a health care Zaylin Runco if:  You have any symptoms of allergy to the contrast dye. These include: ? Shortness of breath. ? Rash or hives. ? A racing heartbeat. Summary  A cardiac CT angiogram is a procedure to look at the heart and  the area around the heart. It may be done to help find the cause of chest pains or other symptoms of heart disease.  During this procedure, a large X-ray machine, called a CT scanner, takes detailed pictures of the heart and the surrounding area after a contrast dye has been injected into blood vessels in the area.  Ask your health care Demarkus Remmel about changing or stopping your regular medicines before the procedure. This is especially important if you are taking diabetes medicines, blood thinners, or medicines to treat erectile dysfunction.  If you are told, drink enough  fluid to keep your urine pale yellow. This will help to flush the contrast dye out of your body. This information is not intended to replace advice given to you by your health care Valincia Touch. Make sure you discuss any questions you have with your health care Keileigh Vahey. Document Revised: 04/17/2019 Document Reviewed: 04/17/2019 Elsevier Patient Education  [employer].

## 2020-09-11 NOTE — Telephone Encounter (Signed)
Patient called to report INR of 1.7, wants to know warfarin dosages. After talking with Lenna Sciara, Leith. Melissa suggest warfarin 5mg  Monday, tues, wed, fri. 3mg  the rest of the week. Patient was notified.

## 2020-09-12 LAB — BASIC METABOLIC PANEL
BUN/Creatinine Ratio: 21 (ref 12–28)
BUN: 19 mg/dL (ref 8–27)
CO2: 25 mmol/L (ref 20–29)
Calcium: 9.2 mg/dL (ref 8.7–10.3)
Chloride: 94 mmol/L — ABNORMAL LOW (ref 96–106)
Creatinine, Ser: 0.92 mg/dL (ref 0.57–1.00)
GFR calc Af Amer: 70 mL/min/{1.73_m2} (ref >59)
GFR calc non Af Amer: 61 mL/min/{1.73_m2} (ref >59)
Glucose: 229 mg/dL — ABNORMAL HIGH (ref 65–99)
Potassium: 4.4 mmol/L (ref 3.5–5.2)
Sodium: 132 mmol/L — ABNORMAL LOW (ref 134–144)

## 2020-09-12 LAB — CBC
Hematocrit: 37.6 % (ref 34.0–46.6)
Hemoglobin: 12.7 g/dL (ref 11.1–15.9)
MCH: 30.4 pg (ref 26.6–33.0)
MCHC: 33.8 g/dL (ref 31.5–35.7)
MCV: 90 fL (ref 79–97)
Platelets: 247 10*3/uL (ref 150–450)
RBC: 4.18 x10E6/uL (ref 3.77–5.28)
RDW: 11.8 % (ref 11.7–15.4)
WBC: 6.5 10*3/uL (ref 3.4–10.8)

## 2020-09-12 LAB — MAGNESIUM: Magnesium: 1.7 mg/dL (ref 1.6–2.3)

## 2020-09-14 ENCOUNTER — Telehealth: Payer: Self-pay

## 2020-09-14 NOTE — Telephone Encounter (Signed)
-----   Message from Berniece Salines, DO sent at 09/14/2020  8:51 AM EST ----- Your sodium is slightly decreased we will continue to keep an eye on it however it is not as bad as 6 months ago when it was 126, blood glucose elevated.

## 2020-09-14 NOTE — Telephone Encounter (Signed)
Spoke with patient regarding results and recommendation.  Patient verbalizes understanding and is agreeable to plan of care. Advised patient to call back with any issues or concerns.  

## 2020-09-16 ENCOUNTER — Telehealth: Payer: Self-pay

## 2020-09-16 NOTE — Telephone Encounter (Signed)
Pt called to notify you that her INR this morning was 2.5. She states she will continue the same coumadin dosing as she is currently taking, unless we call her back. [phone]

## 2020-09-18 ENCOUNTER — Encounter: Payer: Self-pay | Admitting: Oncology

## 2020-09-25 ENCOUNTER — Encounter: Payer: Self-pay | Admitting: Cardiology

## 2020-09-25 ENCOUNTER — Telehealth: Payer: Self-pay

## 2020-09-25 ENCOUNTER — Telehealth (INDEPENDENT_AMBULATORY_CARE_PROVIDER_SITE_OTHER): Payer: Medicare PPO | Admitting: Cardiology

## 2020-09-25 VITALS — BP 150/74 | HR 67 | Ht 60.0 in | Wt 172.0 lb

## 2020-09-25 DIAGNOSIS — I10 Essential (primary) hypertension: Secondary | ICD-10-CM

## 2020-09-25 MED ORDER — LISINOPRIL 20 MG PO TABS: ORAL_TABLET | ORAL | 2 refills | Status: DC

## 2020-09-25 NOTE — Chronic Care Management (AMB) (Signed)
Chronic Care Management Pharmacy Assistant   Name: Pamela [name]  MRN: [id number] DOB: 1946-06-28   PCP : [name], MD  Allergies:   Allergies  Allergen Reactions   Ketek [Telithromycin] Nausea And Vomiting and Rash   Loxapine Succinate Hives   Naproxen Sodium Anaphylaxis and Hives   Amoxapine And Related Hives   Darvon Nausea And Vomiting   Belsomra [Suvorexant]     "Nightmares"   Cymbalta [Duloxetine Hcl]     "Blackouts"   Gabapentin Other (See Comments)    Blurry vision   Amoxicillin Rash   Propoxyphene Nausea And Vomiting    Medications: Outpatient Encounter Medications as of 09/25/2020  Medication Sig   acetaminophen (TYLENOL) 500 MG tablet Take 500-1,000 mg by mouth every 6 (six) hours as needed (for pain.).   albuterol (VENTOLIN HFA) 108 (90 Base) MCG/ACT inhaler Inhale 1-2 puffs into the lungs every 6 (six) hours as needed for wheezing or shortness of breath.    ascorbic acid (VITAMIN C) 500 MG tablet Take 500 mg by mouth.   aspirin EC 81 MG tablet Take 1 tablet (81 mg total) by mouth daily.   atorvastatin (LIPITOR) 40 MG tablet Take 1 tablet (40 mg total) by mouth daily at 12 noon.   azelastine (ASTELIN) 137 MCG/SPRAY nasal spray Place 2 sprays into the nose daily as needed for rhinitis or allergies.    B Complex-C (B-COMPLEX WITH VITAMIN C) tablet Take 1 tablet by mouth daily with lunch.   carvedilol (COREG) 25 MG tablet Take 1 tablet (25 mg total) by mouth 2 (two) times daily with a meal.   Cholecalciferol (VITAMIN D3) 50 MCG (2000 UT) TABS Take 2,000 Units by mouth daily with lunch.    denosumab (PROLIA) 60 MG/ML SOSY injection Inject 60 mg into the skin every 6 (six) months.   Dulaglutide 4.5 MG/0.5ML SOPN Inject 4.5 mg into the skin once a week.   estradiol (ESTRACE) 0.1 MG/GM vaginal cream    ferrous sulfate 325 (65 FE) MG tablet Take 325 mg by mouth daily.   furosemide (LASIX) 20 MG tablet Take 1 tablet (20 mg  total) by mouth daily.   gabapentin (NEURONTIN) 100 MG capsule Take 1 capsule (100 mg total) by mouth 3 (three) times daily.   insulin glargine, 1 Unit Dial, (TOUJEO) 300 UNIT/ML Solostar Pen Inject 17 Units into the skin daily.    ipratropium-albuterol (DUONEB) 0.5-2.5 (3) MG/3ML SOLN Take 3 mLs by nebulization 4 (four) times daily as needed (wheezing/shortness of breath).    lisinopril (ZESTRIL) 20 MG tablet Take 20 mg (1 tablet)  in the morning and 10 mg (1/2 tablet) in th pm daily.   Magnesium Oxide 400 (240 Mg) MG TABS Take 2 tablets (800 mg total) by mouth 2 (two) times daily.   Menthol-Methyl Salicylate (SALONPAS PAIN RELIEF PATCH) PTCH Apply 1 patch topically daily as needed (pain.).   methocarbamol (ROBAXIN) 500 MG tablet Take 500 mg by mouth every 8 (eight) hours as needed for muscle spasms.   ondansetron (ZOFRAN-ODT) 8 MG disintegrating tablet Take 8 mg by mouth every 8 (eight) hours as needed for nausea or vomiting.    oxyCODONE (OXY IR/ROXICODONE) 5 MG immediate release tablet Take 1 tablet (5 mg total) by mouth every 6 (six) hours as needed for severe pain.   pantoprazole (PROTONIX) 40 MG tablet Take 1 tablet (40 mg total) by mouth every morning.   polyethylene glycol (MIRALAX / GLYCOLAX) 17 g packet Take 17 g by  mouth as needed.    senna-docusate (SENOKOT-S) 8.6-50 MG tablet Take 1 tablet by mouth at bedtime.   sodium chloride (OCEAN) 0.65 % SOLN nasal spray Place 1-2 sprays into both nostrils 4 (four) times daily as needed for congestion.   tolterodine (DETROL LA) 2 MG 24 hr capsule Take 2 mg by mouth at bedtime.    traMADol (ULTRAM) 50 MG tablet Take 1 tablet (50 mg total) by mouth every 6 (six) hours as needed for moderate pain.   traZODone (DESYREL) 50 MG tablet Take 50 mg by mouth.   warfarin (COUMADIN) 1 MG tablet Take 1 tablet (1 mg total) by mouth See admin instructions. Take as directed   warfarin (COUMADIN) 5 MG tablet Take 5 mg by mouth daily.   No  facility-administered encounter medications on file as of 09/25/2020.    Current Diagnosis: Patient Active Problem List   Diagnosis Date Noted   PVC's (premature ventricular contractions)    TIA (transient ischemic attack)    Neck pain 06/11/2020   Anemia    Asthma    Bilateral leg cramps    Chronic anticoagulation    Chronic constipation    Complication of anesthesia    DJD (degenerative joint disease)    Dyspnea    GERD (gastroesophageal reflux disease)    Heart murmur    HTN (hypertension)    Hypercholesteremia    Obesity    Pneumonia    PONV (postoperative nausea and vomiting)    Sleep apnea    Stroke [facility])    Encounter for orthopedic follow-up care 03/10/2020   Closed bimalleolar fracture of left ankle 02/26/2020   Hyponatremia 02/06/2019   Solitary pulmonary nodule on lung CT 02/06/2019   Spondylolisthesis, lumbar region 08/09/2018   Low back strain 08/09/2018   Osteoarthritis of right knee 07/06/2018   Low back pain 07/06/2018   Pain in right knee 07/06/2018   Atrial fibrillation [I48.91] 10/01/2014   Transient ischemic attack 07/30/2013   Deep venous thrombosis ([place]) 07/30/2013   DVT (deep venous thrombosis) ([place]) 07/30/2013   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 Yes, 09/11/20-cardiology- Lisinopril was increased to 30MG   Any changes in your medications or health?  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  Any side effects from any medications? Yes, patient  stated that 3-4 days after taking Trulicity she is very sick, she can't eat and just the smell of food makes her have vomiting.   Do you have an symptoms or problems not managed by your medications? Patient stated her neck and shoulder pain.  Any concerns about your health right now? Yes, patient stated that she doesn't think her blood sugar medications are working, her readings from Jan1-today have been between 208-199. Patient stated that her blood pressure has been up and down as well they range from 126/73 to 154/74 today.  Has your provider asked that you check blood pressure, blood sugar, or follow special diet at home? Patient stated she checks her blood pressure and blood sugar, they are not currently well controlled.  She  does try to eat healthy.   Do you get any type of exercise on a regular basis?  Patient had previous surgery on her left leg and ankle for a break, she has rods and pins, she does all the shopping and walks a lot inside the house.   Can you think of a goal you would like to reach for your health? Patient stated she wants to get blood sugar and blood pressure under control.  Do you have any problems getting your medications? Patient states that sometimes she has to call more than once for a refill.  Is there anything that you would like to discuss during the appointment? Patient just wants better symptom control.   Patient is aware this is a phone appointment and medications will be gone over.  Follow-Up:  Pharmacist Review  Donette Larry, CPP notified  Clarita Leber, [place] Pharmacist Assistant [phone]

## 2020-09-25 NOTE — Addendum Note (Signed)
Addended by: Senaida Ores on: 09/25/2020 10:46 AM   Modules accepted: Orders

## 2020-09-25 NOTE — Progress Notes (Signed)
Telephone visit   Date:  09/25/2020   ID:  Pamela [name], DOB 04/28/1946, MRN [id number]  The patient is at home.  I am in the office  PCP:  Cox, Elnita Maxwell, MD  Cardiologist:  Berniece Salines, DO  Electrophysiologist:  None   Evaluation Performed: follow up visit  Chief Complaint:    History of Present Illness:    Pamela [name] is a 75 y.o. female with hx of Diabetes Mellitus, GERD, protein S deficiency chronic anticoagulation with Coumadinand has had DVT in the past,hypertension, hypercholesteremia, PVC, Paroxysmal atrial fibrillation history of a left heart catheterization with normal coronaries, history of TIA.She had a ORIF ankle fracture in June and status post open reduction internal fixation.  I last saw the patient in November 2021 at that time she was experiencing significant elevated blood pressure. We discussed blood pressure control at that time I increased her lisinopril to 5 mg daily and she remain on her carvedilol 25 mg twice daily and her Lasix was twice daily as well.  I saw the patient on August 14, 2020 at that time she reports she has been experiencing some dizziness therefore I placed a monitor on the patient making sure that the symptoms were not caused due to an arrhythmia.  I also cut back on her Lasix from twice a day to once daily.  I saw her on 09/11/2020, at that time we increase the lisinopril to 20 mg daily. She is her today. She offers no complaints. She did read her blood pressures to me her systolic is averaging in the 150s to 160s.  She has had some dizziness but she did see ENT who said that this could be inner ear alignment issues.  The patient does not have symptoms concerning for COVID-19 infection.  Past Medical History:  Diagnosis Date   Anemia    Asthma    Atrial fibrillation [I48.91] 10/01/2014   Bilateral leg cramps    Chronic anticoagulation    Chronic constipation    Closed bimalleolar fracture of left  ankle 2/37/6283   Complication of anesthesia    Deep venous thrombosis ([place]) 07/30/2013   Diabetes mellitus    Dizziness 05/23/2011   DJD (degenerative joint disease)    DOE (dyspnea on exertion) 03/03/2013   Onset early 2020  - 02/05/2019   Walked RA  2 laps @  approx 2102ft each @ nl pace  stopped due to  Light headed, no sp or sob, no desats     DVT (deep venous thrombosis) ([place]) 07/30/2013   Dyspnea    occasionally   Dyspnea on exertion 03/03/2013   Onset early 2020  - 02/05/2019   Walked RA  2 laps @  approx 289ft each @ nl pace  stopped due to  Light headed, no sp or sob, no desats     Encounter for orthopedic follow-up care 03/10/2020   GERD (gastroesophageal reflux disease)    Heart murmur    as a child   HTN (hypertension)    Hypercholesteremia    Hypertension 05/23/2011   Hyponatremia 02/06/2019   Na 126 on no obvious meds to cause it with last na 133 in 2014    Long term (current) use of anticoagulants 03/03/2013   Low back pain 07/06/2018   Low back strain 08/09/2018   Neck pain 06/11/2020   Obesity    Osteoarthritis of right knee 07/06/2018   Pain in right knee 07/06/2018   Pneumonia    PONV (postoperative  nausea and vomiting)    needs pre-medication   Protein S deficiency ([place])    prior DVT L knee, L arm DVT,  followed by Dr West Pugh at [facility]   PVC's (premature ventricular contractions)    S/P cardiac catheterization 02/2013   Normal coronaries; low normal EF at 50%   Sleep apnea    can't use the cpap (diagnosed 2017)   Solitary pulmonary nodule on lung CT 02/06/2019   First noted 01/13/2014 > no change as of 12/21/2018   Spondylolisthesis, lumbar region 08/09/2018   Stroke ([place])    TIA in 2018 or 2019   TIA (transient ischemic attack)    Transient ischemic attack 07/30/2013   Ventricular premature beats 05/23/2011   Past Surgical History:  Procedure Laterality Date   CARPAL TUNNEL RELEASE  1994   bilateral   CHOLECYSTECTOMY  1993    COLONOSCOPY     FOOT TENDON SURGERY Right    left knee replacement  2006   ORIF ANKLE FRACTURE Left 02/26/2020   Procedure: OPEN REDUCTION INTERNAL FIXATION (ORIF) ANKLE FRACTURE;  Surgeon: Nicholes Stairs, MD;  Location: [facility];  Service: Orthopedics;  Laterality: Left;  90 mins   thumb surgery Left 2018   TONSILLECTOMY     TOTAL ABDOMINAL HYSTERECTOMY  1988     Current Meds  Medication Sig   acetaminophen (TYLENOL) 500 MG tablet Take 500-1,000 mg by mouth every 6 (six) hours as needed (for pain.).   albuterol (VENTOLIN HFA) 108 (90 Base) MCG/ACT inhaler Inhale 1-2 puffs into the lungs every 6 (six) hours as needed for wheezing or shortness of breath.    ascorbic acid (VITAMIN C) 500 MG tablet Take 500 mg by mouth.   aspirin EC 81 MG tablet Take 1 tablet (81 mg total) by mouth daily.   atorvastatin (LIPITOR) 40 MG tablet Take 1 tablet (40 mg total) by mouth daily at 12 noon.   azelastine (ASTELIN) 137 MCG/SPRAY nasal spray Place 2 sprays into the nose daily as needed for rhinitis or allergies.    B Complex-C (B-COMPLEX WITH VITAMIN C) tablet Take 1 tablet by mouth daily with lunch.   carvedilol (COREG) 25 MG tablet Take 1 tablet (25 mg total) by mouth 2 (two) times daily with a meal.   Cholecalciferol (VITAMIN D3) 50 MCG (2000 UT) TABS Take 2,000 Units by mouth daily with lunch.    denosumab (PROLIA) 60 MG/ML SOSY injection Inject 60 mg into the skin every 6 (six) months.   Dulaglutide 4.5 MG/0.5ML SOPN Inject 4.5 mg into the skin once a week.   estradiol (ESTRACE) 0.1 MG/GM vaginal cream    ferrous sulfate 325 (65 FE) MG tablet Take 325 mg by mouth daily.   furosemide (LASIX) 20 MG tablet Take 1 tablet (20 mg total) by mouth daily.   gabapentin (NEURONTIN) 100 MG capsule Take 1 capsule (100 mg total) by mouth 3 (three) times daily.   insulin glargine, 1 Unit Dial, (TOUJEO) 300 UNIT/ML Solostar Pen Inject 17 Units into the skin daily.    ipratropium-albuterol  (DUONEB) 0.5-2.5 (3) MG/3ML SOLN Take 3 mLs by nebulization 4 (four) times daily as needed (wheezing/shortness of breath).    lisinopril (ZESTRIL) 20 MG tablet Take 1 tablet (20 mg total) by mouth daily.   Magnesium Oxide 400 (240 Mg) MG TABS Take 2 tablets (800 mg total) by mouth 2 (two) times daily.   Menthol-Methyl Salicylate (SALONPAS PAIN RELIEF PATCH) PTCH Apply 1 patch topically daily as needed (pain.).  methocarbamol (ROBAXIN) 500 MG tablet Take 500 mg by mouth every 8 (eight) hours as needed for muscle spasms.   ondansetron (ZOFRAN-ODT) 8 MG disintegrating tablet Take 8 mg by mouth every 8 (eight) hours as needed for nausea or vomiting.    oxyCODONE (OXY IR/ROXICODONE) 5 MG immediate release tablet Take 1 tablet (5 mg total) by mouth every 6 (six) hours as needed for severe pain.   pantoprazole (PROTONIX) 40 MG tablet Take 1 tablet (40 mg total) by mouth every morning.   polyethylene glycol (MIRALAX / GLYCOLAX) 17 g packet Take 17 g by mouth as needed.    senna-docusate (SENOKOT-S) 8.6-50 MG tablet Take 1 tablet by mouth at bedtime.   sodium chloride (OCEAN) 0.65 % SOLN nasal spray Place 1-2 sprays into both nostrils 4 (four) times daily as needed for congestion.   tolterodine (DETROL LA) 2 MG 24 hr capsule Take 2 mg by mouth at bedtime.    traMADol (ULTRAM) 50 MG tablet Take 1 tablet (50 mg total) by mouth every 6 (six) hours as needed for moderate pain.   traZODone (DESYREL) 50 MG tablet Take 50 mg by mouth.   warfarin (COUMADIN) 1 MG tablet Take 1 tablet (1 mg total) by mouth See admin instructions. Take as directed   warfarin (COUMADIN) 5 MG tablet Take 5 mg by mouth daily.     Allergies:   Ketek [telithromycin], Loxapine succinate, Naproxen sodium, Amoxapine and related, Darvon, Belsomra [suvorexant], Cymbalta [duloxetine hcl], Gabapentin, Amoxicillin, and Propoxyphene   Social History   Tobacco Use   Smoking status: Never Smoker   Smokeless tobacco: Never Used   Vaping Use   Vaping Use: Never used  Substance Use Topics   Alcohol use: No   Drug use: No     Family Hx: The patient's family history includes Antithrombin III deficiency in her mother; Cirrhosis in her mother; Coronary artery disease in her father; Heart attack in her brother, father, and sister; Hypertension in her brother, father, and sister; Lung cancer in her brother; Protein S deficiency in her daughter and sister; Ulcerative colitis in her mother. There is no history of Stroke, Colitis, Colon polyps, Esophageal cancer, Liver cancer, Pancreatic cancer, Rectal cancer, or Stomach cancer.  ROS:   Please see the history of present illness.     Review of Systems  Constitution: Reports some dizziness.  Negative for decreased appetite, fever and weight gain.  HENT: Negative for congestion, ear discharge, hoarse voice and sore throat.   Eyes: Negative for discharge, redness, vision loss in right eye and visual halos.  Cardiovascular: Negative for chest pain, dyspnea on exertion, leg swelling, orthopnea and palpitations.  Respiratory: Negative for cough, hemoptysis, shortness of breath and snoring.   Endocrine: Negative for heat intolerance and polyphagia.  Hematologic/Lymphatic: Negative for bleeding problem. Does not bruise/bleed easily.  Skin: Negative for flushing, nail changes, rash and suspicious lesions.  Musculoskeletal: Negative for arthritis, joint pain, muscle cramps, myalgias, neck pain and stiffness.  Gastrointestinal: Negative for abdominal pain, bowel incontinence, diarrhea and excessive appetite.  Genitourinary: Negative for decreased libido, genital sores and incomplete emptying.  Neurological: Negative for brief paralysis, focal weakness, headaches and loss of balance.  Psychiatric/Behavioral: Negative for altered mental status, depression and suicidal ideas.  Allergic/Immunologic: Negative for HIV exposure and persistent infections.    Prior CV studies:   The  following studies were reviewed today:  EchocardiogramIMPRESSIONS  1. Left ventricular ejection fraction, by estimation, is 60 to 65%. The left ventricle has normal function.  The left ventricle has no regional wall motion abnormalities. There is moderate concentric left ventricular hypertrophy. Left ventricular  diastolic parameters are consistent with Grade I diastolic dysfunction (impaired relaxation).  2. Right ventricular systolic function is normal. The right ventricular size is normal. There is normal pulmonary artery systolic pressure.  3. Left atrial size was mildly dilated.  4. The mitral valve is normal in structure. Trivial mitral valve regurgitation. No evidence of mitral stenosis.  5. The aortic valve is tricuspid. Aortic valve regurgitation is not visualized. No aortic stenosis is present.  6. The inferior vena cava is normal in size with greater than 50% respiratory variability, suggesting right atrial pressure of 3 mmHg.   Labs/Other Tests and Data Reviewed:    EKG: none today  Recent Labs: 11/14/2019: TSH 2.020 03/10/2020: Pro B Natriuretic peptide (BNP) 135.0 08/13/2020: ALT 44 09/11/2020: BUN 19; Creatinine, Ser 0.92; Hemoglobin 12.7; Magnesium 1.7; Platelets 247; Potassium 4.4; Sodium 132   Recent Lipid Panel Lab Results  Component Value Date/Time   CHOL 104 07/24/2020 11:11 AM   TRIG 62 07/24/2020 11:11 AM   HDL 42 07/24/2020 11:11 AM   CHOLHDL 2.5 07/24/2020 11:11 AM   LDLCALC 48 07/24/2020 11:11 AM    Wt Readings from Last 3 Encounters:  09/25/20 172 lb (78 kg)  09/11/20 174 lb (78.9 kg)  08/14/20 177 lb (80.3 kg)     Objective:    Vital Signs:  BP (!) 150/74    Pulse 67    Ht 5' (1.524 m)    Wt 172 lb (78 kg)    SpO2 93%    BMI 33.59 kg/m    No physical exam, due to telephone visit   ASSESSMENT & PLAN:    1. Hypertension   Her blood pressure is improving.  Not yet at target at least at 140/90 without any dizziness or lightheadedness.  What  we will do is increase the lisinopril to 20 mg in the morning and 10 mg at nighttime.  She was to continue on carvedilol 25 mg twice daily.     COVID-19 Education: The signs and symptoms of COVID-19 were discussed with the patient and how to seek care for testing (follow up with PCP or arrange E-visit).  The importance of social distancing was discussed today.  Time:   Today, I have spent 10 minutes with the patient with telehealth technology discussing the above problems.     Medication Adjustments/Labs and Tests Ordered: Current medicines are reviewed at length with the patient today.  Concerns regarding medicines are outlined above.   Tests Ordered: No orders of the defined types were placed in this encounter.   Medication Changes: No orders of the defined types were placed in this encounter.   Follow Up: 4 months or sooner if needed.  Rolly Pancake, DO  09/25/2020 10:23 AM    [facility]

## 2020-09-25 NOTE — Patient Instructions (Signed)
Medication Instructions:  Your physician has recommended you make the following change in your medication:   Increase: Lisinopril to 20 mg in the morning and 10 mg in the pm   *If you need a refill on your cardiac medications before your next appointment, please call your pharmacy*   Lab Work: None If you have labs (blood work) drawn today and your tests are completely normal, you will receive your results only by:  [place] (if you have MyChart) OR  A paper copy in the mail If you have any lab test that is abnormal or we need to change your treatment, we will call you to review the results.   Testing/Procedures: None   Follow-Up: At [facility], you and your health needs are our priority.  As part of our continuing mission to provide you with exceptional heart care, we have created designated Provider Care Teams.  These Care Teams include your primary Cardiologist (physician) and Advanced Practice Providers (APPs -  Physician Assistants and Nurse Practitioners) who all work together to provide you with the care you need, when you need it.  We recommend signing up for the patient portal called "MyChart".  Sign up information is provided on this After Visit Summary.  MyChart is used to connect with patients for Virtual Visits (Telemedicine).  Patients are able to view lab/test results, encounter notes, upcoming appointments, etc.  Non-urgent messages can be sent to your provider as well.   To learn more about what you can do with MyChart, go to [email or web address].    Your next appointment:   4 month(s)  The format for your next appointment:   In Person  Provider:   Berniece Salines, DO   Other Instructions

## 2020-09-28 ENCOUNTER — Telehealth: Payer: Self-pay

## 2020-09-28 NOTE — Telephone Encounter (Signed)
Pt called to port her INR is 3.0 this morning. [phone]

## 2020-09-28 NOTE — Telephone Encounter (Signed)
INR upper limits of normal at 3.0. Patient states she is taking Coumadin 5 mg M,T,W,T and 3 mg rest of day. Will changed to 5 mg M,W,F and 3 mg rest of days and repeat INR in 2 weeks. Patient verbalized understanding.

## 2020-09-30 ENCOUNTER — Ambulatory Visit: Payer: Medicare PPO

## 2020-09-30 ENCOUNTER — Other Ambulatory Visit: Payer: Self-pay

## 2020-09-30 DIAGNOSIS — E7800 Pure hypercholesterolemia, unspecified: Secondary | ICD-10-CM

## 2020-09-30 DIAGNOSIS — I10 Essential (primary) hypertension: Secondary | ICD-10-CM

## 2020-09-30 DIAGNOSIS — E1140 Type 2 diabetes mellitus with diabetic neuropathy, unspecified: Secondary | ICD-10-CM

## 2020-09-30 DIAGNOSIS — Z794 Long term (current) use of insulin: Secondary | ICD-10-CM

## 2020-09-30 NOTE — Chronic Care Management (AMB) (Signed)
Chronic Care Management Pharmacy  Name: Pamela [name]  MRN: [id number] DOB: 11-23-1945  Chief Complaint/ HPI  Pamela [name],  75 y.o. , female presents for their Initial CCM visit with the clinical pharmacist via telephone due to COVID-19 Pandemic.  PCP : [name], MD  Their chronic conditions include: hypertension, afib, hx of TIA/Stroke, asthma, sleep apnea, chronic constipation. GERD, OA of right knee, protein s deficiency, anemia, hypercholesterolemia.   Plan Recommendations:   Recommend resuming Trulicity 1.5 mg weekly to determine if symptoms are coming from higher dose.  - samples available for trial of 1.5 mg if [name] approves. Patient aware that we will likely have to increase insulin or add additional oral medication. She will continue to check blood sugar daily and record in log.   Office Visits: 08/17/2020 - stop gabapentin.  07/29/2020 - gabapentin prescribed for diabetic neuropahty. Increase lisinopril 20 mg daily. May increase tylenol for back pain.  06/16/2020 - Pfizer Covid vaccine.  05/26/2020 - increase carvedilol 25 mg bid. Flu shot given.  Consult Visit: 09/25/2020 - cardiology - increase lisinopril 20 mg am and 10 mg pm.  09/11/2020 - cardiology - coronary CTA ordered. Dizziness has resolved. BP elevated in office. Increase lisinopril 20 mg daily.  08/14/2020 - Cardiology - decrease lasix to once daily. Discuss reducing gabapentin with PCP due to dizziness. Heart monitor recommended to rule out arrhythmias. Marland Kitchen  08/04/2020 - Hematology  - no medication changes. Follow-up in 6 months.  07/14/2020 - Cardio - increase lisinopril 5 mg daily. BP elevated at home and in office.  06/05/2020 - Cardio - worsening leg edema. Increase furosemide 20 mg twice daily.  Medications: Outpatient Encounter Medications as of 09/30/2020  Medication Sig  . acetaminophen (TYLENOL) 500 MG tablet Take 500-1,000 mg by mouth every 6 (six) hours as needed  (for pain.).  Marland Kitchen ascorbic acid (VITAMIN C) 500 MG tablet Take 500 mg by mouth.  Marland Kitchen aspirin EC 81 MG tablet Take 1 tablet (81 mg total) by mouth daily.  Marland Kitchen atorvastatin (LIPITOR) 40 MG tablet Take 1 tablet (40 mg total) by mouth daily at 12 noon.  . B Complex-C (B-COMPLEX WITH VITAMIN C) tablet Take 1 tablet by mouth daily with lunch.  . carvedilol (COREG) 25 MG tablet Take 1 tablet (25 mg total) by mouth 2 (two) times daily with a meal.  . Cholecalciferol (VITAMIN D3) 50 MCG (2000 UT) TABS Take 2,000 Units by mouth daily with lunch.   . denosumab (PROLIA) 60 MG/ML SOSY injection Inject 60 mg into the skin every 6 (six) months.  . Dulaglutide (TRULICITY) 3 XL/2.4MW SOPN Inject 3 mg into the skin once a week.  . estradiol (ESTRACE) 0.1 MG/GM vaginal cream Uses twice weekly  . ferrous sulfate 325 (65 FE) MG tablet Take 325 mg by mouth daily.  . furosemide (LASIX) 20 MG tablet Take 1 tablet (20 mg total) by mouth daily.  . insulin glargine, 1 Unit Dial, (TOUJEO) 300 UNIT/ML Solostar Pen Inject 17 Units into the skin daily.   Marland Kitchen lisinopril (ZESTRIL) 20 MG tablet Take 20 mg (1 tablet)  in the morning and 10 mg (1/2 tablet) in th pm daily.  . Magnesium Oxide 400 (240 Mg) MG TABS Take 2 tablets (800 mg total) by mouth 2 (two) times daily.  . Menthol-Methyl Salicylate (SALONPAS PAIN RELIEF PATCH) PTCH Apply 1 patch topically daily as needed (pain.).  Marland Kitchen methocarbamol (ROBAXIN) 500 MG tablet Take 500 mg by mouth every 8 (  eight) hours as needed for muscle spasms.  . ondansetron (ZOFRAN-ODT) 8 MG disintegrating tablet Take 8 mg by mouth every 8 (eight) hours as needed for nausea or vomiting.   . pantoprazole (PROTONIX) 40 MG tablet Take 1 tablet (40 mg total) by mouth every morning.  . polyethylene glycol (MIRALAX / GLYCOLAX) 17 g packet Take 17 g by mouth as needed. Uses 1-2 times weekly  . senna-docusate (SENOKOT-S) 8.6-50 MG tablet Take 1 tablet by mouth at bedtime.  . sodium chloride (OCEAN) 0.65 % SOLN  nasal spray Place 1-2 sprays into both nostrils 4 (four) times daily as needed for congestion.  Marland Kitchen tolterodine (DETROL LA) 2 MG 24 hr capsule Take 2 mg by mouth at bedtime.   . traMADol (ULTRAM) 50 MG tablet Take 1 tablet (50 mg total) by mouth every 6 (six) hours as needed for moderate pain.  Marland Kitchen warfarin (COUMADIN) 1 MG tablet Take 1 tablet (1 mg total) by mouth See admin instructions. Take as directed  . warfarin (COUMADIN) 5 MG tablet Take 5 mg by mouth daily.  Marland Kitchen albuterol (VENTOLIN HFA) 108 (90 Base) MCG/ACT inhaler Inhale 1-2 puffs into the lungs every 6 (six) hours as needed for wheezing or shortness of breath.  (Patient not taking: Reported on 09/30/2020)  . azelastine (ASTELIN) 137 MCG/SPRAY nasal spray Place 2 sprays into the nose daily as needed for rhinitis or allergies.  (Patient not taking: Reported on 09/30/2020)  . Dulaglutide 4.5 MG/0.5ML SOPN Inject 4.5 mg into the skin once a week. (Patient not taking: Reported on 09/30/2020)  . gabapentin (NEURONTIN) 100 MG capsule Take 1 capsule (100 mg total) by mouth 3 (three) times daily. (Patient not taking: Reported on 09/30/2020)  . ipratropium-albuterol (DUONEB) 0.5-2.5 (3) MG/3ML SOLN Take 3 mLs by nebulization 4 (four) times daily as needed (wheezing/shortness of breath).  (Patient not taking: Reported on 09/30/2020)  . oxyCODONE (OXY IR/ROXICODONE) 5 MG immediate release tablet Take 1 tablet (5 mg total) by mouth every 6 (six) hours as needed for severe pain. (Patient not taking: Reported on 09/30/2020)  . [DISCONTINUED] traZODone (DESYREL) 50 MG tablet Take 50 mg by mouth.   No facility-administered encounter medications on file as of 09/30/2020.   Allergies  Allergen Reactions  . Ketek [Telithromycin] Nausea And Vomiting and Rash  . Loxapine Succinate Hives  . Naproxen Sodium Anaphylaxis and Hives  . Amoxapine And Related Hives  . Darvon Nausea And Vomiting  . Belsomra [Suvorexant]     "Nightmares"  . Cymbalta [Duloxetine Hcl]      "Blackouts"  . Gabapentin Other (See Comments)    Blurry vision  . Amoxicillin Rash  . Propoxyphene Nausea And Vomiting   SDOH Screenings   Alcohol Screen: Not on file  Depression (PHQ2-9): Low Risk   . PHQ-2 Score: 0  Financial Resource Strain: Not on file  Food Insecurity: No Food Insecurity  . Worried About Charity fundraiser in the Last Year: Never true  . Ran Out of Food in the Last Year: Never true  Housing: Low Risk   . Last Housing Risk Score: 0  Physical Activity: Not on file  Social Connections: Not on file  Stress: Not on file  Tobacco Use: Low Risk   . Smoking Tobacco Use: Never Smoker  . Smokeless Tobacco Use: Never Used  Transportation Needs: No Transportation Needs  . Lack of Transportation (Medical): No  . Lack of Transportation (Non-Medical): No     Current Diagnosis/Assessment:  Goals Addressed  This Visit's Progress   . Pharmacy Care Plan       CARE PLAN ENTRY (see longitudinal plan of care for additional care plan information)  Current Barriers:  . Chronic Disease Management support, education, and care coordination needs related to Hypertension, Hyperlipidemia, and Diabetes   Hypertension BP Readings from Last 3 Encounters:  09/25/20 (!) 150/74  09/11/20 134/70  08/14/20 (!) 100/50   . Pharmacist Clinical Goal(s): o Over the next 90 days, patient will work with PharmD and providers to achieve BP goal <140/90 . Current regimen:   Carvedilol 25 mg bid with a meal  Furosemide 20 mg daily   Lisinopril 20 mg and 10 mg in the evening daily  . Interventions: o Reviewed home blood pressure readings.  o Reviewed diet and exercise regimen.  o Encouraged patient to try checking blood pressure 1 hour after taking medication.  . Patient self care activities - Over the next 90 days, patient will: o Check BP daily, document, and provide at future appointments o Ensure daily salt intake < 2300 mg/day  Hyperlipidemia Lab Results   Component Value Date/Time   LDLCALC 48 07/24/2020 11:11 AM   . Pharmacist Clinical Goal(s): o Over the next 90 days, patient will work with PharmD and providers to maintain LDL goal < 70 . Current regimen:  . aspirin ec 81 mg daily  . Atorvastatin 40 mg daily at noon  . Interventions: o Reviewed recent lipid panel result.  o Discussed diet and exercise.  . Patient self care activities - Over the next 90 days, patient will: o Continue current medication as prescribed.   Diabetes Lab Results  Component Value Date/Time   HGBA1C 8.6 (H) 08/13/2020 05:01 PM   HGBA1C 7.4 (H) 02/26/2020 11:33 AM   . Pharmacist Clinical Goal(s): o Over the next 90 days, patient will work with PharmD and providers to achieve A1c goal <7% . Current regimen:   Trulicity 3 DG/6.4 ml weekly   Toujeo 300 unit/ml solostar pen 17 units daily  . Interventions: o Reviewed patient's symptoms of vomiting and nausea 2-3 days after Trulicity each week. Recommend considering reducing to 1.5 mg weekly to determine if symptoms improved. o Reviewed home blood sugar readings ~130s fasting.  o Discussed recent A1c results.   o Reviewed diet and exercise regimen. Exercise currently limited by back and neck pain.  . Patient self care activities - Over the next 90 days, patient will: o Check blood sugar once daily, document, and provide at future appointments o Contact provider with any episodes of hypoglycemia  Medication management . Pharmacist Clinical Goal(s): o Over the next 90 days, patient will work with PharmD and providers to maintain optimal medication adherence . Current pharmacy: [facility]  . Interventions o Comprehensive medication review performed. o Continue current medication management strategy . Patient self care activities - Over the next 90 days, patient will: o Focus on medication adherence by using pill box o Take medications as prescribed o Report any questions or concerns to PharmD and/or  provider(s)  Initial goal documentation        Diabetes   Recent Relevant Labs: Lab Results  Component Value Date/Time   HGBA1C 8.6 (H) 08/13/2020 05:01 PM   HGBA1C 7.4 (H) 02/26/2020 11:33 AM   MICROALBUR 10 07/29/2020 12:01 PM    Lab Results  Component Value Date   CREATININE 0.92 09/11/2020   BUN 19 09/11/2020   GFR 57.98 (L) 03/10/2020   GFRNONAA 61 09/11/2020   GFRAA 70  09/11/2020   NA 132 (L) 09/11/2020   K 4.4 09/11/2020   CALCIUM 9.2 09/11/2020   CO2 25 09/11/2020    Checking BG: Daily  Recent FBG Readings: 131, 105, 133, 134  mg/dL  Patient has failed these meds in past: Bydureon, pioglitazone, Actoplusmet, Prandin, Tradjenta, metforming (didn't control blood sugar well) Patient is currently uncontrolled on the following medications:   Trulicity 3 XB/1.4 ml weekly   Toujeo 300 unit/ml solostar pen 17 units daily   Last diabetic Foot exam: No results found for: HMDIABEYEEXA  Last diabetic Eye exam: July 2021  We discussed: diet and exercise extensively   Diet: Breakfast today was bacon, eggs, toast and coffee. Yesterday was raisin bran cereal and cheerios mixed together. Lunch yesterday was potted meat sandwich. Supper was coleslaw and 1/2 cup beefaroni. Unsweetened tea, coffee, water and milk.   In the last 6 months, she begins throwing up 2-3 days after shot. This lasts for about 1 day. Food makes her nauseous during this time. She eats and drinks during this time but throws it up if she gets up or tries to do anything. Patient has been using 3 mg weekly dose with side effects. She did not experience side effects with 1.5 mg weekly dose but states her blood sugar was higher (171-229). Patient is having to use zofran to control nausea/vomtiing associated with Trulicity dose. Patient also reports increased constipation.   Patient's lowest blood sugar in the past month was 99 on 09/25/2020.   Plan  Recommend resuming Trulicity 1.5 mg weekly to determine if  symptoms are coming from higher dose.  - samples available for trial of 1.5 mg if [name] approves. Patient aware that we will likely have to increase insulin or add additional oral medication. She will continue to check blood sugar daily and record in log.   Hypertension   BP today is:  <140/90  Office blood pressures are  BP Readings from Last 3 Encounters:  09/25/20 (!) 150/74  09/11/20 134/70  08/14/20 (!) 100/50    Patient has failed these meds in the past: amlodipine, spironolactpme, telmisartan, valsartan, verapamil Patient is currently uncontrolled on the following medications:   Carvedilol 25 mg bid with a meal  Furosemide 20 mg daily   Lisinopril 20 mg and 10 mg in the evening daily   Patient checks BP at home daily when she first wakes up .   Patient home BP readings are ranging: 160/84   We discussed diet and exercise extensively. CT of heart scheduled Friday of this week.   Diet: Decaf coffee and tea.   Exercise: Struggling to stand or stay upright lately due to back/neck pain. Unable to lift heavy objects.   Plan  Recommend patient check blood pressure 1 hour after medication. Patient will report readings at 1 week follow-up with pharmacist.   COPD / Asthma / Tobacco   Eosinophil count:   Lab Results  Component Value Date/Time   EOSPCT 1.0 02/05/2019 12:26 PM  %                               Eos (Absolute):  Lab Results  Component Value Date/Time   EOSABS 0.2 07/24/2020 11:11 AM    Tobacco Status:  Social History   Tobacco Use  Smoking Status Never Smoker  Smokeless Tobacco Never Used    Patient has failed these meds in past: none reported  Patient is currently controlled  on the following medications:   Albuterol inhaler 1-2 puffs into the lungs every 6 hours prn for wheezing or shortness of breath - not currently using   Azelastine nasal spray 2 sprays into the nose daily prn rhinitis or allergies - not currently using   Duoneb 3 mls QID  prn wheezing/shortness of breath - not currently using   Ocean nasal spray 1-2 sprays into both nostrils QID prn for congestion  Using maintenance inhaler regularly? No Frequency of rescue inhaler use:  never  We discussed:  Patient reports symptoms well controlled currently with saline nasal spray. Encouraged patient to continue using saline nasal spray. Discussed patient's goal of reducing medication and managing symptoms.   Plan  Continue current medications    AFIB   Patient is currently rate controlled. HR 67 BPM  Patient has failed these meds in past: none reported Patient is currently controlled on the following medications:   Warfarin adjusted by hematologist   Carvedilol 25 mg bid    We discussed:  Patient denies episodes of racing heart lately. Patient checks INR at home and hematology monitors.   Plan  Continue current medications     Hyperlipidemia   LDL goal < 70  Last lipids Lab Results  Component Value Date   CHOL 104 07/24/2020   HDL 42 07/24/2020   LDLCALC 48 07/24/2020   TRIG 62 07/24/2020   CHOLHDL 2.5 07/24/2020   Hepatic Function Latest Ref Rng & Units 08/13/2020 07/24/2020 05/26/2020  Total Protein 6.0 - 8.5 g/dL 6.3 6.2 6.8  Albumin 3.7 - 4.7 g/dL 3.8 3.6(L) 4.1  AST 0 - 40 IU/L _0 ALT 0 - 32 IU/L 44(H) 26 31  Alk Phosphatase 44 - 121 IU/L 93 78 88  Total Bilirubin 0.0 - 1.2 mg/dL 0.4 0.5 0.4     The ASCVD Risk score (Brimson., et al., 2013) failed to calculate for the following reasons:   The patient has a prior MI or stroke diagnosis   Patient has failed these meds in past: none reported Patient is currently controlled on the following medications:  . aspirin ec 81 mg daily  . Atorvastatin 40 mg daily at noon   We discussed:  diet and exercise extensively  Plan  Continue current medications  Back Pain   Patient has failed these meds in past: celebrex  Patient is currently uncontrolled on the following  medications:  . acetaminophen [phone] mg every 6 hours prn pain  . Oxycodone 5 mg every 6 hours prn severe pain - last resort  . Tramadol 50 mg every 6 hours prn for moderate pain - last resort . Gabapentin 100 mg three times daily  . Methocarbamol 500 mg every 8 hours prn muscle spasms . Salonspas pain relief patch daily prn pain  .  We discussed:   Had a bad night with leg/back pain last night. Has had injections in her back and tried PT in the past. Has MRI scheduled this weekend. Patient uses tramadol and oxycodone as a last resort. Tries to rely on tylenol and methocarbamol more.   Plan  Continue current medications    Anemia   CBC Latest Ref Rng & Units 09/11/2020 07/24/2020 05/26/2020  WBC 3.4 - 10.8 x10E3/uL 6.5 5.8 6.6  Hemoglobin 11.1 - 15.9 g/dL 12.7 12.5 13.2  Hematocrit 34.0 - 46.6 % 37.6 37.9 40.4  Platelets 150 - 450 x10E3/uL 247 268 301   Iron/TIBC/Ferritin/ %Sat No results found for: IRON, TIBC, FERRITIN,  IRONPCTSAT  Patient has failed these meds in past: none reported Patient is currently controlled on the following medications:  . ferrous sulfate 325 mg three times weekly  We discussed:  Patient reports that she can only manage iron three times weekly due to stomach issues.   Plan  Continue current medications    Constipation   Patient has failed these meds in past: none reported Patient is currently controlled on the following medications:  .  Marland Kitchen Miralax 17 grams prn  . Senokot-docusate daily at bedtime   We discussed:  Patient reports that iron makes constipation worse. Managing with current regimen.   Plan  Continue current medications   Bladder Incontinence   Patient has failed these meds in past: Myrbetriq, Vesicare Patient is currently uncontrolled on the following medications:  . detrol LA 2 mg at bedtime  We discussed:  Still getting up 2-3 times each night. Was previously getting up closer to 4 times each night. Patient complains of  dry mouth. Discussed it is a side effect of bladder medication.   Plan  Continue current medications   Osteopenia / Osteoporosis   Last DEXA Scan: not available in chart  No results found for: VD25OH   Patient has failed these meds in past: none reported Patient is currently managed on the following medications:  . Prolia 50 mg/ml every 6 months . Vitamin D 2000 units daily with lunch   We discussed:  Recommend [phone] units of vitamin D daily. Recommend 1200 mg of calcium daily from dietary and supplemental sources. Recommend weight-bearing and muscle strengthening exercises for building and maintaining bone density.   Patient broke her leg 02/2020. Hasn't been able to get Prolia in our office. Pharmacist coordinated benefits and provided to [facility] to get shot scheduled.   Plan  Continue current medications     Health Maintenance   Patient is currently controlled on the following medications:  . Estradiol cream vaginal cream twice weekly  . Vitamin C 500 mg  . B complex daily with lunch . Magnesium 400 mg - 2 tablets bid . Pantoprazole 40 mg daily every morning - GERD . Zofran ODT 8 mg every 8 hours prn nausea or vomiting  We discussed:  Patient states that she is having to use Zofran after Trulicity dose each week.   Plan  Continue current medications  Vaccines   Reviewed and discussed patient's vaccination history.    Immunization History  Administered Date(s) Administered  . Fluad Quad(high Dose 65+) 05/26/2020  . Influenza,inj,Quad PF,6+ Mos 06/04/2013  . PFIZER(Purple Top)SARS-COV-2 Vaccination 09/22/2019, 10/12/2019, 06/16/2020  . Pneumococcal Conjugate-13 07/30/2014  . Pneumococcal Polysaccharide-23 03/31/2008    Plan  Patient up to date on COVID and flu shot.    Medication Management   Patient's preferred pharmacy is:  [id number] [address], [place] AT [address] [place] [address] Phone: [phone] Fax: [phone]  Uses pill box? Yes Pt endorses excellent compliance - uses pill box with three times daily.   We discussed: Current pharmacy is preferred with insurance plan and patient is satisfied with pharmacy services  Plan  Continue current medication management strategy    Follow up: 1 week blood sugar and blood pressure check with pharmacist.

## 2020-10-01 ENCOUNTER — Telehealth (HOSPITAL_COMMUNITY): Payer: Self-pay | Admitting: *Deleted

## 2020-10-01 NOTE — Patient Instructions (Signed)
Visit Information  Thank you for your time discussing your medications. I look forward to working with you to achieve your health care goals. Below is a summary of what we talked about during our visit.   Goals Addressed            This Visit's Progress   . Pharmacy Care Plan       CARE PLAN ENTRY (see longitudinal plan of care for additional care plan information)  Current Barriers:  . Chronic Disease Management support, education, and care coordination needs related to Hypertension, Hyperlipidemia, and Diabetes   Hypertension BP Readings from Last 3 Encounters:  09/25/20 (!) 150/74  09/11/20 134/70  08/14/20 (!) 100/50   . Pharmacist Clinical Goal(s): o Over the next 90 days, patient will work with PharmD and providers to achieve BP goal <140/90 . Current regimen:   Carvedilol 25 mg bid with a meal  Furosemide 20 mg daily   Lisinopril 20 mg and 10 mg in the evening daily  . Interventions: o Reviewed home blood pressure readings.  o Reviewed diet and exercise regimen.  o Encouraged patient to try checking blood pressure 1 hour after taking medication.  . Patient self care activities - Over the next 90 days, patient will: o Check BP daily, document, and provide at future appointments o Ensure daily salt intake < 2300 mg/day  Hyperlipidemia Lab Results  Component Value Date/Time   LDLCALC 48 07/24/2020 11:11 AM   . Pharmacist Clinical Goal(s): o Over the next 90 days, patient will work with PharmD and providers to maintain LDL goal < 70 . Current regimen:  . aspirin ec 81 mg daily  . Atorvastatin 40 mg daily at noon  . Interventions: o Reviewed recent lipid panel result.  o Discussed diet and exercise.  . Patient self care activities - Over the next 90 days, patient will: o Continue current medication as prescribed.   Diabetes Lab Results  Component Value Date/Time   HGBA1C 8.6 (H) 08/13/2020 05:01 PM   HGBA1C 7.4 (H) 02/26/2020 11:33 AM   . Pharmacist  Clinical Goal(s): o Over the next 90 days, patient will work with PharmD and providers to achieve A1c goal <7% . Current regimen:   Trulicity 3 mg/0.5 ml weekly   Toujeo 300 unit/ml solostar pen 17 units daily  . Interventions: o Reviewed patient's symptoms of vomiting and nausea 2-3 days after Trulicity each week. Recommend considering reducing to 1.5 mg weekly to determine if symptoms improved. o Reviewed home blood sugar readings ~130s fasting.  o Discussed recent A1c results.   o Reviewed diet and exercise regimen. Exercise currently limited by back and neck pain.  . Patient self care activities - Over the next 90 days, patient will: o Check blood sugar once daily, document, and provide at future appointments o Contact provider with any episodes of hypoglycemia  Medication management . Pharmacist Clinical Goal(s): o Over the next 90 days, patient will work with PharmD and providers to maintain optimal medication adherence . Current pharmacy: [facility]  . Interventions o Comprehensive medication review performed. o Continue current medication management strategy . Patient self care activities - Over the next 90 days, patient will: o Focus on medication adherence by using pill box o Take medications as prescribed o Report any questions or concerns to PharmD and/or provider(s)  Initial goal documentation        [name] was given information about Chronic Care Management services today including:  1. CCM service includes personalized support  from designated clinical staff supervised by her physician, including individualized plan of care and coordination with other care providers 2. 24/7 contact phone numbers for assistance for urgent and routine care needs. 3. Standard insurance, coinsurance, copays and deductibles apply for chronic care management only during months in which we provide at least 20 minutes of these services. Most insurances cover these services at 100%,  however patients may be responsible for any copay, coinsurance and/or deductible if applicable. This service may help you avoid the need for more expensive face-to-face services. 4. Only one practitioner may furnish and bill the service in a calendar month. 5. The patient may stop CCM services at any time (effective at the end of the month) by phone call to the office staff.  Patient agreed to services and verbal consent obtained.   Patient verbalizes understanding of instructions provided today and agrees to view in [place].  Telephone follow up appointment with pharmacy team member scheduled for: 10/2020 to review blood sugar results  Sherre Poot, PharmD Clinical Pharmacist [place] [phone] (office) [phone] (mobile) Dulaglutide Injection What is this medicine? DULAGLUTIDE (DOO la GLOO tide) controls blood sugar in people with type 2 diabetes. It is used with lifestyle changes like diet and exercise. It may lower the risk of problems that need treatment in the hospital. These problems include heart attack or stroke. This medicine may be used for other purposes; ask your health care provider or pharmacist if you have questions. COMMON BRAND NAME(S): Trulicity What should I tell my health care provider before I take this medicine? They need to know if you have any of these conditions:  endocrine tumors (MEN 2) or if someone in your family had these tumors  eye disease, vision problems  history of pancreatitis  kidney disease  liver disease  stomach or intestine problems  thyroid cancer or if someone in your family had thyroid cancer  an unusual or allergic reaction to dulaglutide, other medicines, foods, dyes, or preservatives  pregnant or trying to get pregnant  breast-feeding How should I use this medicine? This medicine is injected under the skin. You will be taught how to prepare and give it. Take it as directed on the prescription label on the  same day of each week. Do NOT prime the pen. Keep taking it unless your health care provider tells you to stop. If you use this medicine with insulin, you should inject this medicine and the insulin separately. Do not mix them together. Do not give the injections right next to each other. Change (rotate) injection sites with each injection. This drug comes with INSTRUCTIONS FOR USE. Ask your pharmacist for directions on how to use this medicine. Read the information carefully. Talk to your pharmacist or health care provider if you have questions. It is important that you put your used needles and syringes in a special sharps container. Do not put them in a trash can. If you do not have a sharps container, call your pharmacist or health care provider to get one. A special MedGuide will be given to you by the pharmacist with each prescription and refill. Be sure to read this information carefully each time. Talk to your health care provider about the use of this medicine in children. Special care may be needed. Overdosage: If you think you have taken too much of this medicine contact a poison control center or emergency room at once. NOTE: This medicine is only for you. Do not share this medicine with  others. What if I miss a dose? If you miss a dose, take it as soon as you can unless it is more than 3 days late. If it is more than 3 days late, skip the missed dose. Take the next dose at the normal time. What may interact with this medicine?  other medicines for diabetes Many medications may cause changes in blood sugar, these include:  alcohol containing beverages  antiviral medicines for HIV or AIDS  aspirin and aspirin-like drugs  certain medicines for blood pressure, heart disease, irregular heart beat  chromium  diuretics  female hormones, such as estrogens or progestins, birth control pills  fenofibrate  gemfibrozil  isoniazid  lanreotide  female hormones or anabolic  steroids  MAOIs like Carbex, Eldepryl, Marplan, Nardil, and Parnate  medicines for allergies, asthma, cold, or cough  medicines for depression, anxiety, or psychotic disturbances  medicines for weight loss  niacin  nicotine  NSAIDs, medicines for pain and inflammation, like ibuprofen or naproxen  octreotide  pasireotide  pentamidine  phenytoin  probenecid  quinolone antibiotics such as ciprofloxacin, levofloxacin, ofloxacin  some herbal dietary supplements  steroid medicines such as prednisone or cortisone  sulfamethoxazole; trimethoprim  thyroid hormones Some medications can hide the warning symptoms of low blood sugar (hypoglycemia). You may need to monitor your blood sugar more closely if you are taking one of these medications. These include:  beta-blockers, often used for high blood pressure or heart problems (examples include atenolol, metoprolol, propranolol)  clonidine  guanethidine  reserpine This list may not describe all possible interactions. Give your health care provider a list of all the medicines, herbs, non-prescription drugs, or dietary supplements you use. Also tell them if you smoke, drink alcohol, or use illegal drugs. Some items may interact with your medicine. What should I watch for while using this medicine? Visit your health care provider for regular checks on your progress. Check with your health care provider if you have severe diarrhea, nausea, and vomiting, or if you sweat a lot. The loss of too much body fluid may make it dangerous for you to take this medicine. A test called the HbA1C (A1C) will be monitored. This is a simple blood test. It measures your blood sugar control over the last 2 to 3 months. You will receive this test every 3 to 6 months. Learn how to check your blood sugar. Learn the symptoms of low and high blood sugar and how to manage them. Always carry a quick-source of sugar with you in case you have symptoms of low  blood sugar. Examples include hard sugar candy or glucose tablets. Make sure others know that you can choke if you eat or drink when you develop serious symptoms of low blood sugar, such as seizures or unconsciousness. Get medical help at once. Tell your health care provider if you have high blood sugar. You might need to change the dose of your medicine. If you are sick or exercising more than usual, you may need to change the dose of your medicine. Do not skip meals. Ask your health care provider if you should avoid alcohol. Many nonprescription cough and cold products contain sugar or alcohol. These can affect blood sugar. Pens should never be shared. Even if the needle is changed, sharing may result in passing of viruses like hepatitis or HIV. Wear a medical ID bracelet or chain. Carry a card that describes your condition. List the medicines and doses you take on the card. What side effects may  I notice from receiving this medicine? Side effects that you should report to your doctor or health care professional as soon as possible:  allergic reactions (skin rash, itching or hives; swelling of the face, lips, or tongue)  changes in vision  diarrhea that continues or is severe  infection (fever, chills, cough, sore throat, pain or trouble passing urine)  kidney injury (trouble passing urine or change in the amount of urine)  low blood sugar (feeling anxious; confusion; dizziness; increased hunger; unusually weak or tired; increased sweating; shakiness; cold, clammy skin; irritable; headache; blurred vision; fast heartbeat; loss of consciousness)  lump or swelling on the neck  trouble breathing  trouble swallowing  unusual stomach upset or pain  vomiting Side effects that usually do not require medical attention (report to your doctor or health care professional if they continue or are bothersome):  lack or loss of appetite  nausea  pain, redness, or irritation at site where  injected This list may not describe all possible side effects. Call your doctor for medical advice about side effects. You may report side effects to FDA at 1-800-FDA-1088. Where should I keep my medicine? Keep out of the reach of children and pets. Refrigeration (preferred): Store unopened pens in a refrigerator between 2 and 8 degrees C (36 and 46 degrees F). Keep it in the original carton until you are ready to take it. Do not freeze or use if the medicine has been frozen. Protect from light. Get rid of any unused medicine after the expiration date on the label. Room Temperature: The pen may be stored at room temperature below 30 degrees C (86 degrees F) for up to a total of 14 days if needed. Protect from light. Avoid exposure to extreme heat. If it is stored at room temperature, throw away any unused medicine after 14 days or after it expires, whichever is first. To get rid of medicines that are no longer needed or have expired:  Take the medicine to a medicine take-back program. Check with your pharmacy or law enforcement to find a location.  If you cannot return the medicine, ask your pharmacist or health care provider how to get rid of this medicine safely. NOTE: This sheet is a summary. It may not cover all possible information. If you have questions about this medicine, talk to your doctor, pharmacist, or health care provider.  2021 [employer] Standard (2020-06-22 07:35:51)

## 2020-10-01 NOTE — Telephone Encounter (Signed)
Reaching out to patient to offer assistance regarding upcoming cardiac imaging study; pt verbalizes understanding of appt date/time, parking situation and where to check in, pre-test NPO status, and verified current allergies; name and call back number provided for further questions should they arise  Tziporah Knoke RN Navigator Cardiac [facility] [phone] office [phone] cell  

## 2020-10-02 ENCOUNTER — Ambulatory Visit (HOSPITAL_COMMUNITY)
Admission: RE | Admit: 2020-10-02 | Discharge: 2020-10-02 | Disposition: A | Discharge status: Home / Self Care | Payer: Medicare PPO | Source: Ambulatory Visit | Attending: Cardiology | Admitting: Cardiology

## 2020-10-02 ENCOUNTER — Other Ambulatory Visit: Payer: Self-pay

## 2020-10-02 DIAGNOSIS — I700 Atherosclerosis of aorta: Secondary | ICD-10-CM | POA: Diagnosis not present

## 2020-10-02 DIAGNOSIS — I2510 Atherosclerotic heart disease of native coronary artery without angina pectoris: Secondary | ICD-10-CM | POA: Insufficient documentation

## 2020-10-02 DIAGNOSIS — R079 Chest pain, unspecified: Secondary | ICD-10-CM | POA: Insufficient documentation

## 2020-10-02 MED ORDER — IOHEXOL 350 MG/ML SOLN
80.0000 mL | Freq: Once | INTRAVENOUS | Status: AC | PRN
  Administered 2020-10-02: 80 mL via INTRAVENOUS

## 2020-10-02 MED ORDER — NITROGLYCERIN 0.4 MG SL SUBL
SUBLINGUAL_TABLET | SUBLINGUAL | Status: AC
  Filled 2020-10-02: qty 2

## 2020-10-02 MED ORDER — NITROGLYCERIN 0.4 MG SL SUBL
0.8000 mg | SUBLINGUAL_TABLET | Freq: Once | SUBLINGUAL | Status: AC
  Administered 2020-10-02: 0.8 mg via SUBLINGUAL

## 2020-10-02 NOTE — Progress Notes (Signed)
CT scan completed. Tolerated well. D/C home ambulatory with husband. Awake and alert. In no distress. 

## 2020-10-03 DIAGNOSIS — M542 Cervicalgia: Secondary | ICD-10-CM | POA: Diagnosis not present

## 2020-10-06 DIAGNOSIS — R102 Pelvic and perineal pain: Secondary | ICD-10-CM | POA: Diagnosis not present

## 2020-10-06 DIAGNOSIS — N390 Urinary tract infection, site not specified: Secondary | ICD-10-CM | POA: Diagnosis not present

## 2020-10-06 DIAGNOSIS — N952 Postmenopausal atrophic vaginitis: Secondary | ICD-10-CM | POA: Diagnosis not present

## 2020-10-06 DIAGNOSIS — N3946 Mixed incontinence: Secondary | ICD-10-CM | POA: Diagnosis not present

## 2020-10-08 ENCOUNTER — Telehealth: Payer: Medicare PPO

## 2020-10-08 ENCOUNTER — Other Ambulatory Visit: Payer: Self-pay

## 2020-10-08 ENCOUNTER — Ambulatory Visit (INDEPENDENT_AMBULATORY_CARE_PROVIDER_SITE_OTHER): Payer: Medicare PPO

## 2020-10-08 DIAGNOSIS — R109 Unspecified abdominal pain: Secondary | ICD-10-CM | POA: Diagnosis not present

## 2020-10-08 DIAGNOSIS — N390 Urinary tract infection, site not specified: Secondary | ICD-10-CM | POA: Diagnosis not present

## 2020-10-08 DIAGNOSIS — M810 Age-related osteoporosis without current pathological fracture: Secondary | ICD-10-CM

## 2020-10-08 DIAGNOSIS — I700 Atherosclerosis of aorta: Secondary | ICD-10-CM | POA: Diagnosis not present

## 2020-10-08 DIAGNOSIS — Z9049 Acquired absence of other specified parts of digestive tract: Secondary | ICD-10-CM | POA: Diagnosis not present

## 2020-10-08 DIAGNOSIS — R102 Pelvic and perineal pain: Secondary | ICD-10-CM | POA: Diagnosis not present

## 2020-10-08 MED ORDER — DENOSUMAB 60 MG/ML ~~LOC~~ SOSY
60.0000 mg | PREFILLED_SYRINGE | Freq: Once | SUBCUTANEOUS | Status: AC
  Administered 2020-10-08: 60 mg via SUBCUTANEOUS

## 2020-10-09 DIAGNOSIS — M5416 Radiculopathy, lumbar region: Secondary | ICD-10-CM | POA: Diagnosis not present

## 2020-10-09 DIAGNOSIS — M5412 Radiculopathy, cervical region: Secondary | ICD-10-CM | POA: Diagnosis not present

## 2020-10-09 DIAGNOSIS — D6859 Other primary thrombophilia: Secondary | ICD-10-CM | POA: Diagnosis not present

## 2020-10-12 ENCOUNTER — Telehealth: Payer: Self-pay

## 2020-10-12 DIAGNOSIS — Z7901 Long term (current) use of anticoagulants: Secondary | ICD-10-CM | POA: Diagnosis not present

## 2020-10-12 DIAGNOSIS — D6859 Other primary thrombophilia: Secondary | ICD-10-CM | POA: Diagnosis not present

## 2020-10-13 ENCOUNTER — Other Ambulatory Visit: Payer: Self-pay

## 2020-10-13 ENCOUNTER — Telehealth: Payer: Self-pay | Admitting: Hematology and Oncology

## 2020-10-13 NOTE — Telephone Encounter (Signed)
Called patient regarding therapeutic INR of 2.8.  She is currently talking Coumadin 3mg  M,W,F and 5mg  the rest of days. She knows to continue that dose and repeat an INR at home on 10/26/20. She states she is going to have injections in her back again, but is unsure of the date. Previously, she states she held Coumadin for the injections and she did fine.

## 2020-10-14 NOTE — Telephone Encounter (Signed)
Rosanne Sack called pt 10/13/20

## 2020-10-15 ENCOUNTER — Ambulatory Visit (INDEPENDENT_AMBULATORY_CARE_PROVIDER_SITE_OTHER): Payer: Medicare PPO

## 2020-10-15 ENCOUNTER — Other Ambulatory Visit: Payer: Self-pay

## 2020-10-15 DIAGNOSIS — Z794 Long term (current) use of insulin: Secondary | ICD-10-CM | POA: Diagnosis not present

## 2020-10-15 DIAGNOSIS — E1140 Type 2 diabetes mellitus with diabetic neuropathy, unspecified: Secondary | ICD-10-CM | POA: Diagnosis not present

## 2020-10-15 NOTE — Chronic Care Management (AMB) (Signed)
Chronic Care Management Pharmacy  Name: Pamela [name]  MRN: [id number] DOB: May 28, 1946  Chief Complaint/ HPI  Pamela [name],  75 y.o. , female presents for their Follow-Up CCM visit with the clinical pharmacist via telephone due to COVID-19 Pandemic.  PCP : [name], MD  Their chronic conditions include: hypertension, afib, hx of TIA/Stroke, asthma, sleep apnea, chronic constipation. GERD, OA of right knee, protein s deficiency, anemia, hypercholesterolemia.   Plan Recommendations:   Patient tried Trulicity 1.5 mg weekly last week on Thursday. Friday she experienced symptoms of nausea/vomiting all day much like with the higher doses. Pharmacist consulted with Dr. Henrene Pastor and Larene Beach who agreed to discontinue Trulicity and increase basal insulin to 20 units daily. Patient will monitor blood sugar daily and report any low blood sugar before next blood work and visit in 1 month.   Office Visits: 08/17/2020 - stop gabapentin.  07/29/2020 - gabapentin prescribed for diabetic neuropahty. Increase lisinopril 20 mg daily. May increase tylenol for back pain.  06/16/2020 - Pfizer Covid vaccine.  05/26/2020 - increase carvedilol 25 mg bid. Flu shot given.  Consult Visit: 09/25/2020 - cardiology - increase lisinopril 20 mg am and 10 mg pm.  09/11/2020 - cardiology - coronary CTA ordered. Dizziness has resolved. BP elevated in office. Increase lisinopril 20 mg daily.  08/14/2020 - Cardiology - decrease lasix to once daily. Discuss reducing gabapentin with PCP due to dizziness. Heart monitor recommended to rule out arrhythmias. Marland Kitchen  08/04/2020 - Hematology  - no medication changes. Follow-up in 6 months.  07/14/2020 - Cardio - increase lisinopril 5 mg daily. BP elevated at home and in office.  06/05/2020 - Cardio - worsening leg edema. Increase furosemide 20 mg twice daily.  Medications: Outpatient Encounter Medications as of 10/15/2020  Medication Sig  .  acetaminophen (TYLENOL) 500 MG tablet Take 500-1,000 mg by mouth every 6 (six) hours as needed (for pain.).  Marland Kitchen albuterol (VENTOLIN HFA) 108 (90 Base) MCG/ACT inhaler Inhale 1-2 puffs into the lungs every 6 (six) hours as needed for wheezing or shortness of breath.  Marland Kitchen ascorbic acid (VITAMIN C) 500 MG tablet Take 500 mg by mouth.  Marland Kitchen aspirin EC 81 MG tablet Take 1 tablet (81 mg total) by mouth daily.  Marland Kitchen atorvastatin (LIPITOR) 40 MG tablet Take 1 tablet (40 mg total) by mouth daily at 12 noon.  Marland Kitchen azelastine (ASTELIN) 137 MCG/SPRAY nasal spray Place 2 sprays into the nose daily as needed for rhinitis or allergies.  . B Complex-C (B-COMPLEX WITH VITAMIN C) tablet Take 1 tablet by mouth daily with lunch.  . carvedilol (COREG) 25 MG tablet Take 1 tablet (25 mg total) by mouth 2 (two) times daily with a meal.  . Cholecalciferol (VITAMIN D3) 50 MCG (2000 UT) TABS Take 2,000 Units by mouth daily with lunch.   . denosumab (PROLIA) 60 MG/ML SOSY injection Inject 60 mg into the skin every 6 (six) months.  Marland Kitchen estradiol (ESTRACE) 0.1 MG/GM vaginal cream Uses twice weekly  . ferrous sulfate 325 (65 FE) MG tablet Take 325 mg by mouth daily.  . furosemide (LASIX) 20 MG tablet Take 1 tablet (20 mg total) by mouth daily.  . insulin glargine, 1 Unit Dial, (TOUJEO) 300 UNIT/ML Solostar Pen Inject 20 Units into the skin daily.  Marland Kitchen ipratropium-albuterol (DUONEB) 0.5-2.5 (3) MG/3ML SOLN Take 3 mLs by nebulization 4 (four) times daily as needed (wheezing/shortness of breath).  Marland Kitchen lisinopril (ZESTRIL) 20 MG tablet Take 20 mg (1 tablet)  in the morning and 10 mg (1/2 tablet) in th pm daily.  . Magnesium Oxide 400 (240 Mg) MG TABS Take 2 tablets (800 mg total) by mouth 2 (two) times daily.  . Menthol-Methyl Salicylate (SALONPAS PAIN RELIEF PATCH) PTCH Apply 1 patch topically daily as needed (pain.).  Marland Kitchen methocarbamol (ROBAXIN) 500 MG tablet Take 500 mg by mouth every 8 (eight) hours as needed for muscle spasms.  . ondansetron  (ZOFRAN-ODT) 8 MG disintegrating tablet Take 8 mg by mouth every 8 (eight) hours as needed for nausea or vomiting.   Marland Kitchen oxyCODONE (OXY IR/ROXICODONE) 5 MG immediate release tablet Take 1 tablet (5 mg total) by mouth every 6 (six) hours as needed for severe pain.  . pantoprazole (PROTONIX) 40 MG tablet Take 1 tablet (40 mg total) by mouth every morning.  . polyethylene glycol (MIRALAX / GLYCOLAX) 17 g packet Take 17 g by mouth as needed. Uses 1-2 times weekly  . senna-docusate (SENOKOT-S) 8.6-50 MG tablet Take 1 tablet by mouth at bedtime.  . sodium chloride (OCEAN) 0.65 % SOLN nasal spray Place 1-2 sprays into both nostrils 4 (four) times daily as needed for congestion.  Marland Kitchen tolterodine (DETROL LA) 2 MG 24 hr capsule Take 2 mg by mouth at bedtime.   Marland Kitchen warfarin (COUMADIN) 1 MG tablet Take 1 tablet (1 mg total) by mouth See admin instructions. Take as directed  . warfarin (COUMADIN) 5 MG tablet Take 5 mg by mouth daily.  . traMADol (ULTRAM) 50 MG tablet Take 1 tablet (50 mg total) by mouth every 6 (six) hours as needed for moderate pain. (Patient not taking: No sig reported)  . [DISCONTINUED] Dulaglutide (TRULICITY) 3 BM/8.4XL SOPN Inject 3 mg into the skin once a week. (Patient not taking: Reported on 10/15/2020)  . [DISCONTINUED] Dulaglutide 4.5 MG/0.5ML SOPN Inject 4.5 mg into the skin once a week. (Patient not taking: Reported on 10/15/2020)   No facility-administered encounter medications on file as of 10/15/2020.   Allergies  Allergen Reactions  . Ketek [Telithromycin] Nausea And Vomiting and Rash  . Loxapine Succinate Hives  . Naproxen Sodium Anaphylaxis and Hives  . Amoxapine And Related Hives  . Darvon Nausea And Vomiting  . Belsomra [Suvorexant]     "Nightmares"  . Cymbalta [Duloxetine Hcl]     "Blackouts"  . Gabapentin Other (See Comments)    Blurry vision  . Amoxicillin Rash  . Propoxyphene Nausea And Vomiting   SDOH Screenings   Alcohol Screen: Not on file  Depression (PHQ2-9):  Low Risk   . PHQ-2 Score: 0  Financial Resource Strain: Not on file  Food Insecurity: No Food Insecurity  . Worried About Charity fundraiser in the Last Year: Never true  . Ran Out of Food in the Last Year: Never true  Housing: Low Risk   . Last Housing Risk Score: 0  Physical Activity: Not on file  Social Connections: Not on file  Stress: Not on file  Tobacco Use: Low Risk   . Smoking Tobacco Use: Never Smoker  . Smokeless Tobacco Use: Never Used  Transportation Needs: No Transportation Needs  . Lack of Transportation (Medical): No  . Lack of Transportation (Non-Medical): No     Current Diagnosis/Assessment:  Goals Addressed            This Visit's Progress   . Pharmacy Care Plan       CARE PLAN ENTRY (see longitudinal plan of care for additional care plan information)  Current Barriers:  .  Chronic Disease Management support, education, and care coordination needs related to Hypertension, Hyperlipidemia, and Diabetes   Hypertension BP Readings from Last 3 Encounters:  10/02/20 (!) 143/62  09/25/20 (!) 150/74  09/11/20 134/70   . Pharmacist Clinical Goal(s): o Over the next 90 days, patient will work with PharmD and providers to achieve BP goal <140/90 . Current regimen:   Carvedilol 25 mg bid with a meal  Furosemide 20 mg daily   Lisinopril 20 mg and 10 mg in the evening daily  . Interventions: o Reviewed home blood pressure readings.  o Reviewed diet and exercise regimen.  o Encouraged patient to try checking blood pressure 1 hour after taking medication.  . Patient self care activities - Over the next 90 days, patient will: o Check BP daily, document, and provide at future appointments o Ensure daily salt intake < 2300 mg/day  Hyperlipidemia Lab Results  Component Value Date/Time   LDLCALC 48 07/24/2020 11:11 AM   . Pharmacist Clinical Goal(s): o Over the next 90 days, patient will work with PharmD and providers to maintain LDL goal < 70 . Current  regimen:  . aspirin ec 81 mg daily  . Atorvastatin 40 mg daily at noon  . Interventions: o Reviewed recent lipid panel result.  o Discussed diet and exercise.  . Patient self care activities - Over the next 90 days, patient will: o Continue current medication as prescribed.   Diabetes Lab Results  Component Value Date/Time   HGBA1C 8.6 (H) 08/13/2020 05:01 PM   HGBA1C 7.4 (H) 02/26/2020 11:33 AM   . Pharmacist Clinical Goal(s): o Over the next 90 days, patient will work with PharmD and providers to achieve A1c goal <7% . Current regimen:   Toujeo 300 unit/ml solostar pen 20 units daily  . Interventions: o Reviewed patient's symptoms of vomiting and nausea 2-3 days after Trulicity each week. Recommend considering reducing to 1.5 mg weekly to determine if symptoms improved. o Reviewed home blood sugar readings.  o Discussed recent A1c results.   o Reviewed diet and exercise regimen. Exercise currently limited by back and neck pain.  . Patient self care activities - Over the next 90 days, patient will: o Check blood sugar once daily, document, and provide at future appointments o Contact provider with any episodes of hypoglycemia  Medication management . Pharmacist Clinical Goal(s): o Over the next 90 days, patient will work with PharmD and providers to maintain optimal medication adherence . Current pharmacy: [facility]  . Interventions o Comprehensive medication review performed. o Continue current medication management strategy . Patient self care activities - Over the next 90 days, patient will: o Focus on medication adherence by using pill box o Take medications as prescribed o Report any questions or concerns to PharmD and/or provider(s)  Please see past updates related to this goal by clicking on the "Past Updates" button in the selected goal         Diabetes   Recent Relevant Labs: Lab Results  Component Value Date/Time   HGBA1C 8.6 (H) 08/13/2020 05:01 PM    HGBA1C 7.4 (H) 02/26/2020 11:33 AM   MICROALBUR 10 07/29/2020 12:01 PM    Lab Results  Component Value Date   CREATININE 0.92 09/11/2020   BUN 19 09/11/2020   GFR 57.98 (L) 03/10/2020   GFRNONAA 61 09/11/2020   GFRAA 70 09/11/2020   NA 132 (L) 09/11/2020   K 4.4 09/11/2020   CALCIUM 9.2 09/11/2020   CO2 25 09/11/2020  Checking BG: Daily  Recent FBG Readings: 138, 144, 125, 125, 205 (ate late supper night before), 150, 131  mg/dL  Patient has failed these meds in past: Bydureon, pioglitazone, Actoplusmet, Prandin, Tradjenta, metforming (didn't control blood sugar well) Patient is currently uncontrolled on the following medications:   Toujeo 300 unit/ml solostar pen 20 units daily   Last diabetic Foot exam: No results found for: HMDIABEYEEXA  Last diabetic Eye exam: July 2021  We discussed: diet and exercise extensively   Patient's reduction in Trulicity dose was not tolerated. Recommending patient discontinue Trulicity due to nausea/vomiting on days following injection. Patient will monitor blood sugar during the next month before updated bloodwork/visit with [name].   Plan  Increase Toujeo to 20 units daily per Dr. Elmer Bales approval. Monitor blood sugar. Follow-up in 1 month as scheduled with fasting labs and [name] visit.     Medication Management   Patient's preferred pharmacy is:  [id number] [address], [address] [place] [address] Phone: [phone] Fax: [phone]  Uses pill box? Yes Pt endorses excellent compliance - uses pill box with three times daily.   We discussed: Current pharmacy is preferred with insurance plan and patient is satisfied with pharmacy services  Plan  Continue current medication management strategy    Follow up: 1 month with PCP and 2 months with pharmacist

## 2020-10-15 NOTE — Patient Instructions (Addendum)
Visit Information  Goals Addressed            This Visit's Progress   . Pharmacy Care Plan       CARE PLAN ENTRY (see longitudinal plan of care for additional care plan information)  Current Barriers:  . Chronic Disease Management support, education, and care coordination needs related to Hypertension, Hyperlipidemia, and Diabetes   Hypertension BP Readings from Last 3 Encounters:  10/02/20 (!) 143/62  09/25/20 (!) 150/74  09/11/20 134/70   . Pharmacist Clinical Goal(s): o Over the next 90 days, patient will work with PharmD and providers to achieve BP goal <140/90 . Current regimen:   Carvedilol 25 mg bid with a meal  Furosemide 20 mg daily   Lisinopril 20 mg and 10 mg in the evening daily  . Interventions: o Reviewed home blood pressure readings.  o Reviewed diet and exercise regimen.  o Encouraged patient to try checking blood pressure 1 hour after taking medication.  . Patient self care activities - Over the next 90 days, patient will: o Check BP daily, document, and provide at future appointments o Ensure daily salt intake < 2300 mg/day  Hyperlipidemia Lab Results  Component Value Date/Time   LDLCALC 48 07/24/2020 11:11 AM   . Pharmacist Clinical Goal(s): o Over the next 90 days, patient will work with PharmD and providers to maintain LDL goal < 70 . Current regimen:  . aspirin ec 81 mg daily  . Atorvastatin 40 mg daily at noon  . Interventions: o Reviewed recent lipid panel result.  o Discussed diet and exercise.  . Patient self care activities - Over the next 90 days, patient will: o Continue current medication as prescribed.   Diabetes Lab Results  Component Value Date/Time   HGBA1C 8.6 (H) 08/13/2020 05:01 PM   HGBA1C 7.4 (H) 02/26/2020 11:33 AM   . Pharmacist Clinical Goal(s): o Over the next 90 days, patient will work with PharmD and providers to achieve A1c goal <7% . Current regimen:   Toujeo 300 unit/ml solostar pen 20 units daily   . Interventions: o Reviewed patient's symptoms of vomiting and nausea 2-3 days after Trulicity each week. Recommend considering reducing to 1.5 mg weekly to determine if symptoms improved. o Reviewed home blood sugar readings.  o Discussed recent A1c results.   o Reviewed diet and exercise regimen. Exercise currently limited by back and neck pain.  . Patient self care activities - Over the next 90 days, patient will: o Check blood sugar once daily, document, and provide at future appointments o Contact provider with any episodes of hypoglycemia  Medication management . Pharmacist Clinical Goal(s): o Over the next 90 days, patient will work with PharmD and providers to maintain optimal medication adherence . Current pharmacy: [facility]  . Interventions o Comprehensive medication review performed. o Continue current medication management strategy . Patient self care activities - Over the next 90 days, patient will: o Focus on medication adherence by using pill box o Take medications as prescribed o Report any questions or concerns to PharmD and/or provider(s)  Please see past updates related to this goal by clicking on the "Past Updates" button in the selected goal         The patient verbalized understanding of instructions, educational materials, and care plan provided today and declined offer to receive copy of patient instructions, educational materials, and care plan.   Telephone follow up appointment with pharmacy team member scheduled for: 12/2020  Burnice Logan, [facility]  Basics of Medicine Management Taking your medicines correctly is an important part of managing or preventing medical problems. Make sure you know what disease or condition your medicine is treating, and how and when to take it. If you do not take your medicine correctly, it may not work well and may cause unpleasant side effects, including serious health problems. What should I do when I am taking  medicines?  Read all the labels and inserts that come with your medicines. Review the information often.  Talk with your pharmacist if you get a refill and notice a change in the size, color, or shape of your medicines.  Know the potential side effects for each medicine that you take.  Try to get all your medicines from the same pharmacy. The pharmacist will have all your information and will understand how your medicines will affect each other (interact).  Tell your health care provider about all your medicines, including over-the-counter medicines, vitamins, and herbal or dietary supplements. He or she will make sure that nothing will interact with any of your prescribed medicines.   How can I take my medicines safely?  Take medicines only as told by your health care provider. ? Do not take more of your medicine than instructed. ? Do not take anyone else's medicines. ? Do not share your medicines with others. ? Do not stop taking your medicines unless your health care provider tells you to do so. ? You may need to avoid alcohol or certain foods or liquids when taking certain medicines. Follow your health care provider's instructions.  Do not split, mash, or chew your medicines unless your health care provider tells you to do so. Tell your health care provider if you have trouble swallowing your medicines.  For liquid medicine, use the dosing container that was provided. How should I organize my medicines? Know your medicines  Know what each of your medicines looks like. This includes size, color, and shape. Tell your health care provider if you are having trouble recognizing all the medicines that you are taking.  If you cannot tell your medicines apart because they look similar, keep them in original bottles.  If you cannot read the labels on the bottles, tell your pharmacist to put your medicines in containers with large print.  Review your medicines and your schedule with family  members, a friend, or a caregiver. Use a pill organizer  Use a tool to organize your medicine schedule. Tools include a weekly pillbox, a written chart, a notebook, or a calendar.  Your tool should help you remember the following things about each medicine: ? The name of the medicine. ? The amount (dose) to take. ? The schedule. This is the day and time the medicine should be taken. ? The appearance. This includes color, shape, size, and stamp. ? How to take your medicines. This includes instructions to take them with food, without food, with fluids, or with other medicines.  Create reminders for taking your medicines. Use sticky notes, or alarms on your watch, mobile device, or phone calendar.  You may choose to use a more advanced management system. These systems have storage, alarms, and visual and audio prompts.  Some medicines can be taken on an "as-needed" basis. These include medicines for nausea or pain. If you take an as-needed medicine, write down the name and dose, as well as the date and time that you took it.   How should I plan for travel?  Take your pillbox, medicines, and  organization system with you when traveling.  Have your medicines refilled before you travel. This will ensure that you do not run out of your medicines while you are away from home.  Always carry an updated list of your medicines with you. If there is an emergency, a first responder can quickly see what medicines you are taking.  Do not pack your medicines in checked luggage in case your luggage is lost or delayed.  If any of your medicines is considered a controlled substance, make sure you bring a letter from your health care provider with you. How should I store and discard my medicines? For safe storage:  Store medicines in a cool, dry area away from light, or as directed by your health care provider. Do not store medicines in the bathroom. Heat and humidity will affect them.  Do not store your  medicines with other chemicals, or with medicines for pets or other household members.  Keep medicines away from children and pets. Do not leave them on counters or bedside tables. Store them in high cabinets or on high shelves. For safe disposal:  Check expiration dates regularly. Do not take expired medicines. Discard medicines that are older than the expiration date.  Learn a safe way to dispose of your medicines. You may: ? Use a local government, hospital, or pharmacy medicine-take-back program. ? Mix the medicines with inedible substances, put them in a sealed bag or empty container, and throw them in the trash. What should I remember?  Tell your health care provider if you: ? Experience side effects. ? Have new symptoms. ? Have other concerns about taking your medicines.  Review your medicines regularly with your health care provider. Other medicines, diet, medical conditions, weight changes, and daily habits can all affect how medicines work. Ask if you need to continue taking each medicine, and discuss how well each one is working.  Refill your medicines early to avoid running out of them.  In case of an accidental overdose, call your local [place] at [phone] or visit your local emergency department immediately. This is important. Summary  Taking your medicines correctly is an important part of managing or preventing medical problems.  You need to make sure that you understand what you are taking a medicine for, as well as how and when you need to take it.  Know your medicines and use a pill organizer to help you take your medicines correctly.  In case of an accidental overdose, call your local [place] at [phone] or visit your local emergency department immediately. This is important. This information is not intended to replace advice given to you by your health care provider. Make sure you discuss any questions you have with your  health care provider. Document Revised: 08/17/2017 Document Reviewed: 08/17/2017 Elsevier Patient Education  2021 [employer].

## 2020-10-21 ENCOUNTER — Other Ambulatory Visit: Payer: Self-pay | Admitting: Family Medicine

## 2020-10-27 ENCOUNTER — Telehealth: Payer: Self-pay

## 2020-10-27 NOTE — Telephone Encounter (Signed)
Dr Bobby Rumpf notified of pt's INR 1.9. He recommends she stay on the same Coumadin dosing as she is currently taking.  I called pt, notified her of Dr Bobby Rumpf response. I confirmed her current dosing which is Coumadin 5mg  on Mon, Wed, & Friday; Coumadin 3mg  on Sun, Tue, Th, Sat.   Pt reports that she will have to stop coumadin on March 3- November 10, 2020. Dr Nelva Bush will be giving her an injection in her neck.  I will send message to [facility] as well Vida Roller on vacation this wk (2/21-2/25)

## 2020-11-02 ENCOUNTER — Other Ambulatory Visit: Payer: Self-pay | Admitting: Family Medicine

## 2020-11-03 DIAGNOSIS — N390 Urinary tract infection, site not specified: Secondary | ICD-10-CM | POA: Diagnosis not present

## 2020-11-03 DIAGNOSIS — N952 Postmenopausal atrophic vaginitis: Secondary | ICD-10-CM | POA: Diagnosis not present

## 2020-11-04 ENCOUNTER — Encounter: Payer: Self-pay | Admitting: Oncology

## 2020-11-08 ENCOUNTER — Other Ambulatory Visit: Payer: Self-pay | Admitting: Cardiology

## 2020-11-08 ENCOUNTER — Other Ambulatory Visit: Payer: Self-pay | Admitting: Family Medicine

## 2020-11-08 DIAGNOSIS — I10 Essential (primary) hypertension: Secondary | ICD-10-CM

## 2020-11-10 ENCOUNTER — Telehealth: Payer: Self-pay | Admitting: Hematology and Oncology

## 2020-11-10 ENCOUNTER — Telehealth: Payer: Self-pay

## 2020-11-10 DIAGNOSIS — M5412 Radiculopathy, cervical region: Secondary | ICD-10-CM | POA: Diagnosis not present

## 2020-11-10 MED ORDER — LISINOPRIL 20 MG PO TABS: ORAL_TABLET | ORAL | 2 refills | Status: DC

## 2020-11-10 NOTE — Telephone Encounter (Signed)
Pt neck injections have been completed. What dose does she need tonight? She was on Coumadin 3mg  po  Sun, Tues, Thur, Sat; and 5mg  on Mon, Wed, Frid

## 2020-11-10 NOTE — Telephone Encounter (Signed)
Mrs. Alperin called to report that she had an injection in her neck by ortho today.  She was told that her blood sugars would be elevated.  Her bp at their clinic was 197/79 and her blood sugar was 239 this morning.  Since getting home her bp is 201/90 HR 72, and 194/84 HR 75.  Her blood sugar is currently registering HIGH.  [name] advised that she increase her daily Toujeo to 40 units and increase her lisinopril to 40 mg twice daily.  She is scheduled to recheck in the office on Thursday.  She was instructed to call us back if her blood pressure and blood sugar do no improve and if she develops chest pain or numbness or tingling.

## 2020-11-10 NOTE — Telephone Encounter (Signed)
Kelli,PA, called pt and discussed Coumadin dosing.

## 2020-11-10 NOTE — Telephone Encounter (Signed)
The patient's Coumadin has been on hold due to recent procedure. Will have her resume Coumadin 3mg  M,W,F and 5mg  rest of days. She will repeat INR in 2 weeks. Patient verbalizes understanding.

## 2020-11-11 DIAGNOSIS — M25572 Pain in left ankle and joints of left foot: Secondary | ICD-10-CM | POA: Diagnosis not present

## 2020-11-11 NOTE — Progress Notes (Signed)
Subjective:  Patient ID: Pamela [name], female    DOB: [birth year]  Age: 75 y.o. MRN: [id number]  Chief Complaint  Patient presents with  . Hypertension  . Diabetes    HPI  Type 2 diabetes mellitus with diabetic neuropathy, with long-term current use of insulin (HCC) Taking toujeo, checks feet daily, maintains a healthy diet, typically checks FBS 1-2 times per day. Ranges 93-405. Prior to neck injection FBS 239, after neck injection on Tuesday, FBS read "High". Has eye appt 02/2021. Primary hypertension Taking coreg 25 mg one twice a day and lisinopril Hypercholesteremia Controlled with lipitor Longstanding persistent atrial fibrillation (HCC) Takes ASA 81 and coumadin   Current Outpatient Medications on File Prior to Visit  Medication Sig Dispense Refill  . acetaminophen (TYLENOL) 500 MG tablet Take 500-1,000 mg by mouth every 6 (six) hours as needed (for pain.).    Marland Kitchen albuterol (VENTOLIN HFA) 108 (90 Base) MCG/ACT inhaler Inhale 1-2 puffs into the lungs every 6 (six) hours as needed for wheezing or shortness of breath.    Marland Kitchen ascorbic acid (VITAMIN C) 500 MG tablet Take 500 mg by mouth.    Marland Kitchen aspirin EC 81 MG tablet Take 1 tablet (81 mg total) by mouth daily. 90 tablet 3  . atorvastatin (LIPITOR) 40 MG tablet Take 1 tablet (40 mg total) by mouth daily at 12 noon. 90 tablet 3  . azelastine (ASTELIN) 137 MCG/SPRAY nasal spray Place 2 sprays into the nose daily as needed for rhinitis or allergies.    . B Complex-C (B-COMPLEX WITH VITAMIN C) tablet Take 1 tablet by mouth daily with lunch.    . carvedilol (COREG) 25 MG tablet TAKE 1 TABLET(25 MG) BY MOUTH TWICE DAILY WITH A MEAL 180 tablet 0  . Cholecalciferol (VITAMIN D3) 50 MCG (2000 UT) TABS Take 2,000 Units by mouth daily with lunch.     . denosumab (PROLIA) 60 MG/ML SOSY injection Inject 60 mg into the skin every 6 (six) months.    Marland Kitchen estradiol (ESTRACE) 0.1 MG/GM vaginal cream Uses twice weekly    . ferrous sulfate 325  (65 FE) MG tablet Take 325 mg by mouth daily.    . furosemide (LASIX) 20 MG tablet Take 1 tablet (20 mg total) by mouth daily. 90 tablet 3  . ipratropium-albuterol (DUONEB) 0.5-2.5 (3) MG/3ML SOLN Take 3 mLs by nebulization 4 (four) times daily as needed (wheezing/shortness of breath).  0  . lisinopril (ZESTRIL) 20 MG tablet Take 2 tablets (40 mg total) by mouth 2 (two) times daily. 180 tablet 0  . Magnesium Oxide 400 (240 Mg) MG TABS TAKE 2 TABLETS(800 MG) BY MOUTH TWICE DAILY 120 tablet 2  . Menthol-Methyl Salicylate (SALONPAS PAIN RELIEF PATCH) PTCH Apply 1 patch topically daily as needed (pain.).    Marland Kitchen methocarbamol (ROBAXIN) 500 MG tablet Take 500 mg by mouth every 8 (eight) hours as needed for muscle spasms.    . ondansetron (ZOFRAN-ODT) 8 MG disintegrating tablet Take 8 mg by mouth every 8 (eight) hours as needed for nausea or vomiting.     Marland Kitchen oxyCODONE (OXY IR/ROXICODONE) 5 MG immediate release tablet Take 1 tablet (5 mg total) by mouth every 6 (six) hours as needed for severe pain. 20 tablet 0  . pantoprazole (PROTONIX) 40 MG tablet Take 1 tablet (40 mg total) by mouth every morning. 90 tablet 1  . polyethylene glycol (MIRALAX / GLYCOLAX) 17 g packet Take 17 g by mouth as needed. Uses 1-2 times weekly    .  senna-docusate (SENOKOT-S) 8.6-50 MG tablet Take 1 tablet by mouth at bedtime.    . sodium chloride (OCEAN) 0.65 % SOLN nasal spray Place 1-2 sprays into both nostrils 4 (four) times daily as needed for congestion.    Marland Kitchen tolterodine (DETROL LA) 2 MG 24 hr capsule Take 2 mg by mouth at bedtime.     Marland Kitchen warfarin (COUMADIN) 1 MG tablet Take 1 tablet (1 mg total) by mouth See admin instructions. Take as directed (Patient taking differently: Take 3 mg by mouth See admin instructions. Mon-Wed-Fri) 120 tablet 2  . warfarin (COUMADIN) 5 MG tablet Take 5 mg by mouth daily. Lydia Guiles, Thurs and Sat     No current facility-administered medications on file prior to visit.   Past Medical History:   Diagnosis Date  . Anemia   . Asthma   . Atrial fibrillation [I48.91] 10/01/2014  . Bilateral leg cramps   . Chronic anticoagulation   . Chronic constipation   . Closed bimalleolar fracture of left ankle 02/26/2020  . Complication of anesthesia   . Deep venous thrombosis ([place]) 07/30/2013  . Diabetes mellitus   . Dizziness 05/23/2011  . DJD (degenerative joint disease)   . DOE (dyspnea on exertion) 03/03/2013   Onset early 2020  - 02/05/2019   Walked RA  2 laps @  approx 235ft each @ nl pace  stopped due to  Light headed, no sp or sob, no desats    . DVT (deep venous thrombosis) ([place]) 07/30/2013  . Dyspnea    occasionally  . Dyspnea on exertion 03/03/2013   Onset early 2020  - 02/05/2019   Walked RA  2 laps @  approx 212ft each @ nl pace  stopped due to  Light headed, no sp or sob, no desats    . Encounter for orthopedic follow-up care 03/10/2020  . GERD (gastroesophageal reflux disease)   . Heart murmur    as a child  . HTN (hypertension)   . Hypercholesteremia   . Hypertension 05/23/2011  . Hyponatremia 02/06/2019   Na 126 on no obvious meds to cause it with last na 133 in 2014   . Long term (current) use of anticoagulants 03/03/2013  . Low back pain 07/06/2018  . Low back strain 08/09/2018  . Neck pain 06/11/2020  . Obesity   . Osteoarthritis of right knee 07/06/2018  . Pain in right knee 07/06/2018  . Pneumonia   . PONV (postoperative nausea and vomiting)    needs pre-medication  . Protein S deficiency ([place])    prior DVT L knee, L arm DVT,  followed by Dr West Pugh at [facility]  . PVC's (premature ventricular contractions)   . S/P cardiac catheterization 02/2013   Normal coronaries; low normal EF at 50%  . Sleep apnea    can't use the cpap (diagnosed 2017)  . Solitary pulmonary nodule on lung CT 02/06/2019   First noted 01/13/2014 > no change as of 12/21/2018  . Spondylolisthesis, lumbar region 08/09/2018  . Stroke [facility])    TIA in 2018 or 2019  . TIA (transient ischemic attack)   .  Transient ischemic attack 07/30/2013  . Ventricular premature beats 05/23/2011   Past Surgical History:  Procedure Laterality Date  . CARPAL TUNNEL RELEASE  1994   bilateral  . CHOLECYSTECTOMY  1993  . COLONOSCOPY    . FOOT TENDON SURGERY Right   . left knee replacement  2006  . ORIF ANKLE FRACTURE Left 02/26/2020   Procedure: OPEN REDUCTION  INTERNAL FIXATION (ORIF) ANKLE FRACTURE;  Surgeon: Nicholes Stairs, MD;  Location: [facility];  Service: Orthopedics;  Laterality: Left;  90 mins  . thumb surgery Left 2018  . TONSILLECTOMY    . TOTAL ABDOMINAL HYSTERECTOMY  1988    Family History  Problem Relation Age of Onset  . Cirrhosis Mother   . Antithrombin III deficiency Mother        multiple emboli  . Ulcerative colitis Mother   . Coronary artery disease Father   . Heart attack Father   . Hypertension Father   . Protein S deficiency Sister        prior DVT  . Protein S deficiency Daughter   . Hypertension Sister   . Hypertension Brother   . Lung cancer Brother   . Heart attack Brother   . Heart attack Sister        age 30   . Stroke Neg Hx   . Colitis Neg Hx   . Colon polyps Neg Hx   . Esophageal cancer Neg Hx   . Liver cancer Neg Hx   . Pancreatic cancer Neg Hx   . Rectal cancer Neg Hx   . Stomach cancer Neg Hx    Social History   Socioeconomic History  . Marital status: Married    Spouse name: [name]  . Number of children: Not on file  . Years of education: Not on file  . Highest education level: Not on file  Occupational History  . Not on file  Tobacco Use  . Smoking status: Never Smoker  . Smokeless tobacco: Never Used  Vaping Use  . Vaping Use: Never used  Substance and Sexual Activity  . Alcohol use: No  . Drug use: No  . Sexual activity: Not on file  Other Topics Concern  . Not on file  Social History Narrative   Lives with spouse in [place].  2 grown daughters.   Retired Glass blower/designer     Social Determinants of Radio broadcast assistant  Strain: Not on file  Food Insecurity: No Food Insecurity  . Worried About Charity fundraiser in the Last Year: Never true  . Ran Out of Food in the Last Year: Never true  Transportation Needs: No Transportation Needs  . Lack of Transportation (Medical): No  . Lack of Transportation (Non-Medical): No  Physical Activity: Not on file  Stress: Not on file  Social Connections: Not on file    Review of Systems  Constitutional: Negative for chills, fatigue and fever.  HENT: Positive for ear pain (Right. Possibly from neck injection by Otho.). Negative for congestion, rhinorrhea and sore throat.   Respiratory: Negative for cough and shortness of breath.   Cardiovascular: Negative for chest pain.  Gastrointestinal: Negative for abdominal pain, constipation, diarrhea, nausea and vomiting.  Genitourinary: Negative for dysuria and urgency.  Musculoskeletal: Positive for arthralgias, back pain, myalgias and neck pain (Has injection by Ortho on 03/08).  Neurological: Negative for dizziness (Shortly after neck injection. Resolved), weakness (Resolving), light-headedness and headaches.  Psychiatric/Behavioral: Negative for dysphoric mood. The patient is not nervous/anxious.      Objective:  BP (!) 122/54   Pulse (!) 57   Temp (!) 93.4 F (34.1 C)   Ht 5\' 4"  (1.626 m)   Wt 166 lb (75.3 kg)   SpO2 94%   BMI 28.49 kg/m   BP/Weight 11/13/2020 11/12/2020 9/83/3825  Systolic BP 053 976 734  Diastolic BP 78 54 62  Wt. (  Lbs) - 166 -  BMI - 28.49 -    Physical Exam Vitals reviewed.  Constitutional:      Appearance: Normal appearance. She is normal weight.  Neck:     Vascular: No carotid bruit.  Cardiovascular:     Rate and Rhythm: Normal rate and regular rhythm.     Pulses: Normal pulses.     Heart sounds: Normal heart sounds.  Pulmonary:     Effort: Pulmonary effort is normal. No respiratory distress.     Breath sounds: Normal breath sounds.  Abdominal:     General: Abdomen is flat.  Bowel sounds are normal.     Palpations: Abdomen is soft.     Tenderness: There is no abdominal tenderness.  Neurological:     Mental Status: She is alert and oriented to person, place, and time.  Psychiatric:        Mood and Affect: Mood normal.        Behavior: Behavior normal.     Diabetic Foot Exam - Simple   Simple Foot Form Diabetic Foot exam was performed with the following findings: Yes 11/11/2020 10:08 AM  Visual Inspection No deformities, no ulcerations, no other skin breakdown bilaterally: Yes Sensation Testing Intact to touch and monofilament testing bilaterally: Yes Pulse Check Posterior Tibialis and Dorsalis pulse intact bilaterally: Yes Comments      Lab Results  Component Value Date   WBC 8.7 11/12/2020   HGB 13.2 11/12/2020   HCT 40.4 11/12/2020   PLT 279 11/12/2020   GLUCOSE 240 (H) 11/12/2020   CHOL 135 11/12/2020   TRIG 96 11/12/2020   HDL 49 11/12/2020   LDLCALC 68 11/12/2020   ALT 65 (H) 11/12/2020   AST 28 11/12/2020   NA 134 11/12/2020   K 4.6 11/12/2020   CL 93 (L) 11/12/2020   CREATININE 1.26 (H) 11/12/2020   BUN 36 (H) 11/12/2020   CO2 26 11/12/2020   TSH 2.020 11/14/2019   INR 2.8 08/07/2020   HGBA1C 9.8 (H) 11/12/2020   MICROALBUR 10 07/29/2020      Assessment & Plan:  1. Type 2 diabetes mellitus with diabetic neuropathy, with long-term current use of insulin (HCC) Control: poor. A1C came back 9.8. Recommend check sugars fasting daily. Recommend check feet daily. Recommend annual eye exams. Medicines: Increase toujeo 50 U, started on novolog 10 U before largest meal.  Free style 2 CGM applied on 11/13/2020. Continue to work on eating a healthy diet and exercise.  Labs drawn today.   - Hemoglobin A1c - CBC with Differential/Platelet - Cardiovascular Risk Assessment  2. Primary hypertension Increased lisinopril 40 mg one twice a day.  Pt to bring bp monitor in for comparison with ours in am.   Continue to work on eating a  healthy diet and exercise.  Labs drawn today.  - Comprehensive metabolic panel  3. Hypercholesteremia Well controlled.  No changes to medicines.  Continue to work on eating a healthy diet and exercise.  Labs drawn today.  - Lipid panel  4. Longstanding persistent atrial fibrillation (HCC) ON coumadin.  Mgmt per cardiology.   5. 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   Follow-up: Return in about 1 day (around 11/13/2020) for Bp check with her cuff..  An After Visit Summary was printed and given to the patient.  [name], MD [facility] [phone]

## 2020-11-12 ENCOUNTER — Ambulatory Visit (INDEPENDENT_AMBULATORY_CARE_PROVIDER_SITE_OTHER): Payer: Medicare PPO | Admitting: Family Medicine

## 2020-11-12 ENCOUNTER — Other Ambulatory Visit: Payer: Self-pay

## 2020-11-12 VITALS — BP 122/54 | HR 57 | Temp 93.4°F | Ht 64.0 in | Wt 166.0 lb

## 2020-11-12 DIAGNOSIS — E1140 Type 2 diabetes mellitus with diabetic neuropathy, unspecified: Secondary | ICD-10-CM

## 2020-11-12 DIAGNOSIS — E7800 Pure hypercholesterolemia, unspecified: Secondary | ICD-10-CM

## 2020-11-12 DIAGNOSIS — I4811 Longstanding persistent atrial fibrillation: Secondary | ICD-10-CM

## 2020-11-12 DIAGNOSIS — Z794 Long term (current) use of insulin: Secondary | ICD-10-CM

## 2020-11-12 DIAGNOSIS — I10 Essential (primary) hypertension: Secondary | ICD-10-CM

## 2020-11-12 MED ORDER — INSULIN GLARGINE (1 UNIT DIAL) 300 UNIT/ML ~~LOC~~ SOPN: 40.0000 [IU] | PEN_INJECTOR | Freq: Every day | SUBCUTANEOUS | 1 refills | Status: DC

## 2020-11-12 NOTE — Patient Instructions (Addendum)
Continue Toujeo 40 U daily. Return to test your bp cuff with ours.  Recommend continue to work on eating healthy diet and exercise. Call back if sleep does not improve with taking lyrica and using measures below.  Insomnia Insomnia is a sleep disorder that makes it difficult to fall asleep or stay asleep. Insomnia can cause fatigue, low energy, difficulty concentrating, mood swings, and poor performance at work or school. There are three different ways to classify insomnia:  Difficulty falling asleep.  Difficulty staying asleep.  Waking up too early in the morning. Any type of insomnia can be long-term (chronic) or short-term (acute). Both are common. Short-term insomnia usually lasts for three months or less. Chronic insomnia occurs at least three times a week for longer than three months. What are the causes? Insomnia may be caused by another condition, situation, or substance, such as:  Anxiety.  Certain medicines.  Gastroesophageal reflux disease (GERD) or other gastrointestinal conditions.  Asthma or other breathing conditions.  Restless legs syndrome, sleep apnea, or other sleep disorders.  Chronic pain.  Menopause.  Stroke.  Abuse of alcohol, tobacco, or illegal drugs.  Mental health conditions, such as depression.  Caffeine.  Neurological disorders, such as Alzheimer's disease.  An overactive thyroid (hyperthyroidism). Sometimes, the cause of insomnia may not be known. What increases the risk? Risk factors for insomnia include:  Gender. Women are affected more often than men.  Age. Insomnia is more common as you get older.  Stress.  Lack of exercise.  Irregular work schedule or working night shifts.  Traveling between different time zones.  Certain medical and mental health conditions. What are the signs or symptoms? If you have insomnia, the main symptom is having trouble falling asleep or having trouble staying asleep. This may lead to other  symptoms, such as:  Feeling fatigued or having low energy.  Feeling nervous about going to sleep.  Not feeling rested in the morning.  Having trouble concentrating.  Feeling irritable, anxious, or depressed. How is this diagnosed? This condition may be diagnosed based on:  Your symptoms and medical history. Your health care provider may ask about: ? Your sleep habits. ? Any medical conditions you have. ? Your mental health.  A physical exam. How is this treated? Treatment for insomnia depends on the cause. Treatment may focus on treating an underlying condition that is causing insomnia. Treatment may also include:  Medicines to help you sleep.  Counseling or therapy.  Lifestyle adjustments to help you sleep better. Follow these instructions at home: Eating and drinking  Limit or avoid alcohol, caffeinated beverages, and cigarettes, especially close to bedtime. These can disrupt your sleep.  Do not eat a large meal or eat spicy foods right before bedtime. This can lead to digestive discomfort that can make it hard for you to sleep.   Sleep habits  Keep a sleep diary to help you and your health care provider figure out what could be causing your insomnia. Write down: ? When you sleep. ? When you wake up during the night. ? How well you sleep. ? How rested you feel the next day. ? Any side effects of medicines you are taking. ? What you eat and drink.  Make your bedroom a dark, comfortable place where it is easy to fall asleep. ? Put up shades or blackout curtains to block light from outside. ? Use a white noise machine to block noise. ? Keep the temperature cool.  Limit screen use before bedtime. This includes: ?  Watching TV. ? Using your smartphone, tablet, or computer.  Stick to a routine that includes going to bed and waking up at the same times every day and night. This can help you fall asleep faster. Consider making a quiet activity, such as reading, part of  your nighttime routine.  Try to avoid taking naps during the day so that you sleep better at night.  Get out of bed if you are still awake after 15 minutes of trying to sleep. Keep the lights down, but try reading or doing a quiet activity. When you feel sleepy, go back to bed.   General instructions  Take over-the-counter and prescription medicines only as told by your health care provider.  Exercise regularly, as told by your health care provider. Avoid exercise starting several hours before bedtime.  Use relaxation techniques to manage stress. Ask your health care provider to suggest some techniques that may work well for you. These may include: ? Breathing exercises. ? Routines to release muscle tension. ? Visualizing peaceful scenes.  Make sure that you drive carefully. Avoid driving if you feel very sleepy.  Keep all follow-up visits as told by your health care provider. This is important. Contact a health care provider if:  You are tired throughout the day.  You have trouble in your daily routine due to sleepiness.  You continue to have sleep problems, or your sleep problems get worse. Get help right away if:  You have serious thoughts about hurting yourself or someone else. If you ever feel like you may hurt yourself or others, or have thoughts about taking your own life, get help right away. You can go to your nearest emergency department or call:  Your local emergency services (911 in the [place]  A suicide crisis helpline, such as the [facility] at 508-754-5655. This is open 24 hours a day. Summary  Insomnia is a sleep disorder that makes it difficult to fall asleep or stay asleep.  Insomnia can be long-term (chronic) or short-term (acute).  Treatment for insomnia depends on the cause. Treatment may focus on treating an underlying condition that is causing insomnia.  Keep a sleep diary to help you and your health care provider figure out  what could be causing your insomnia. This information is not intended to replace advice given to you by your health care provider. Make sure you discuss any questions you have with your health care provider. Document Revised: 07/02/2020 Document Reviewed: 07/02/2020 Elsevier Patient Education  2021 [employer].

## 2020-11-13 ENCOUNTER — Other Ambulatory Visit: Payer: Self-pay

## 2020-11-13 ENCOUNTER — Other Ambulatory Visit: Payer: Self-pay | Admitting: Family Medicine

## 2020-11-13 ENCOUNTER — Ambulatory Visit (INDEPENDENT_AMBULATORY_CARE_PROVIDER_SITE_OTHER): Payer: Medicare PPO | Admitting: Family Medicine

## 2020-11-13 VITALS — BP 148/78 | HR 57

## 2020-11-13 DIAGNOSIS — I10 Essential (primary) hypertension: Secondary | ICD-10-CM

## 2020-11-13 DIAGNOSIS — Z794 Long term (current) use of insulin: Secondary | ICD-10-CM | POA: Diagnosis not present

## 2020-11-13 DIAGNOSIS — E1140 Type 2 diabetes mellitus with diabetic neuropathy, unspecified: Secondary | ICD-10-CM

## 2020-11-13 LAB — COMPREHENSIVE METABOLIC PANEL
ALT: 65 IU/L — ABNORMAL HIGH (ref 0–32)
AST: 28 IU/L (ref 0–40)
Albumin/Globulin Ratio: 1.6 (ref 1.2–2.2)
Albumin: 4.2 g/dL (ref 3.7–4.7)
Alkaline Phosphatase: 98 IU/L (ref 44–121)
BUN/Creatinine Ratio: 29 — ABNORMAL HIGH (ref 12–28)
BUN: 36 mg/dL — ABNORMAL HIGH (ref 8–27)
Bilirubin Total: 0.6 mg/dL (ref 0.0–1.2)
CO2: 26 mmol/L (ref 20–29)
Calcium: 9.8 mg/dL (ref 8.7–10.3)
Chloride: 93 mmol/L — ABNORMAL LOW (ref 96–106)
Creatinine, Ser: 1.26 mg/dL — ABNORMAL HIGH (ref 0.57–1.00)
Globulin, Total: 2.7 g/dL (ref 1.5–4.5)
Glucose: 240 mg/dL — ABNORMAL HIGH (ref 65–99)
Potassium: 4.6 mmol/L (ref 3.5–5.2)
Sodium: 134 mmol/L (ref 134–144)
Total Protein: 6.9 g/dL (ref 6.0–8.5)
eGFR: 44 mL/min/{1.73_m2} — ABNORMAL LOW (ref >59)

## 2020-11-13 LAB — CBC WITH DIFFERENTIAL/PLATELET
Basophils Absolute: 0 10*3/uL (ref 0.0–0.2)
Basos: 1 %
EOS (ABSOLUTE): 0.1 10*3/uL (ref 0.0–0.4)
Eos: 1 %
Hematocrit: 40.4 % (ref 34.0–46.6)
Hemoglobin: 13.2 g/dL (ref 11.1–15.9)
Immature Grans (Abs): 0 10*3/uL (ref 0.0–0.1)
Immature Granulocytes: 0 %
Lymphocytes Absolute: 2.2 10*3/uL (ref 0.7–3.1)
Lymphs: 25 %
MCH: 30.1 pg (ref 26.6–33.0)
MCHC: 32.7 g/dL (ref 31.5–35.7)
MCV: 92 fL (ref 79–97)
Monocytes Absolute: 0.8 10*3/uL (ref 0.1–0.9)
Monocytes: 10 %
Neutrophils Absolute: 5.5 10*3/uL (ref 1.4–7.0)
Neutrophils: 63 %
Platelets: 279 10*3/uL (ref 150–450)
RBC: 4.39 x10E6/uL (ref 3.77–5.28)
RDW: 12 % (ref 11.7–15.4)
WBC: 8.7 10*3/uL (ref 3.4–10.8)

## 2020-11-13 LAB — LIPID PANEL
Chol/HDL Ratio: 2.8 ratio (ref 0.0–4.4)
Cholesterol, Total: 135 mg/dL (ref 100–199)
HDL: 49 mg/dL (ref >39)
LDL Chol Calc (NIH): 68 mg/dL (ref 0–99)
Triglycerides: 96 mg/dL (ref 0–149)
VLDL Cholesterol Cal: 18 mg/dL (ref 5–40)

## 2020-11-13 LAB — HEMOGLOBIN A1C
Est. average glucose Bld gHb Est-mCnc: 235 mg/dL
Hgb A1c MFr Bld: 9.8 % — ABNORMAL HIGH (ref 4.8–5.6)

## 2020-11-13 LAB — CARDIOVASCULAR RISK ASSESSMENT

## 2020-11-13 MED ORDER — FREESTYLE LIBRE 2 READER DEVI: 1.0000 | Freq: Three times a day (TID) | 0 refills | Status: DC

## 2020-11-13 MED ORDER — INSULIN GLARGINE (1 UNIT DIAL) 300 UNIT/ML ~~LOC~~ SOPN: 50.0000 [IU] | PEN_INJECTOR | Freq: Every day | SUBCUTANEOUS | 1 refills | Status: DC

## 2020-11-13 MED ORDER — FREESTYLE LIBRE 2 SENSOR MISC: 2.0000 | 3 refills | Status: DC

## 2020-11-13 MED ORDER — NOVOLOG FLEXPEN 100 UNIT/ML ~~LOC~~ SOPN: PEN_INJECTOR | SUBCUTANEOUS | 0 refills | Status: DC

## 2020-11-13 MED ORDER — BD PEN NEEDLE NANO 2ND GEN 32G X 4 MM MISC: 3 refills | Status: DC

## 2020-11-13 NOTE — Patient Instructions (Signed)
[name] advised that she purchase a new home bp cuff.  She is going to continue to monitor her bp's at home.

## 2020-11-14 ENCOUNTER — Encounter: Payer: Self-pay | Admitting: Family Medicine

## 2020-11-14 ENCOUNTER — Telehealth: Payer: Self-pay | Admitting: Family Medicine

## 2020-11-14 MED ORDER — PREGABALIN 25 MG PO CAPS: 25.0000 mg | ORAL_CAPSULE | Freq: Three times a day (TID) | ORAL | 0 refills | Status: DC

## 2020-11-14 NOTE — Telephone Encounter (Signed)
Patient called Pamela [name], [facility] and her sugars dropped to 42. BP 98/44. I increased her toujeo to 50 U daily and had her take novolog 10 u prior to largest meal.  She says I also increased her bp medicine. Please review change. Recommend return to previous dosing of lisinopril.  She wants to hold all her insulin to day.  I would recommend dropping to Toujeo 45 U. Hold novolog. Kc

## 2020-11-14 NOTE — Telephone Encounter (Signed)
Called patient made her aware of the information, verbalized understanding. Patient stated she is feeling weak and dizzy. Also talk with patient about eating and making sure she drinks plenty of fluids, [name] stated to hold Lyrica till Monday.

## 2020-11-16 ENCOUNTER — Telehealth: Payer: Self-pay | Admitting: Hematology and Oncology

## 2020-11-16 ENCOUNTER — Telehealth: Payer: Self-pay | Admitting: Family Medicine

## 2020-11-16 NOTE — Telephone Encounter (Signed)
11/14/2020: Early am 42: Ate to bring sugars up.  11 am: 275, BP 98/54, P53 2:20 pm: 260, BP 129/64, p53. 6 pm: 408, BP 138/61, P54. 11:30 pm 341 Returned from lisinoril 40 mg one twice a day to 20 mg in am and 10 mg in pm.  Held novolog. Drop from 50 U to 45 U daily.   11/15/3020: 10:30 am: 59: ate something. BP 127/61, P53. 4:30 pm: 271. BP 179/71, P50s. 8 pm: 437  11/16/2020 8:45 am: 88, BP 130/60, P53.  Recommendations:  Decrease carvedilol to 25 mg 1/2 twice a day. Increase lisinopril 20 mg one twice a day.  Change toujeo 45 U qAM.  Hold Novolog.   Pt to call back on Thursday with BP, P, Sugars.  [place] 2.

## 2020-11-16 NOTE — Telephone Encounter (Signed)
We received the report of home INR done on 03/12 of 1.  The patient just resumed Coumadin after procedure on 3/8. We did not instruct her to repeat the INR on 03/02.  She did have issues with changes in her blood sugar and blood pressure due to changes in medication.  This may have led to some confusion.  She states she is taking Coumadin 3 mg Monday, Wednesday and Friday and 5 mg the rest of days.  I will plan to have her repeat an INR tomorrow 3/15 after she has been back on the Coumadin for a week.  We may need to adjust her doses if her INR is not therapeutic at that time.  The patient verbalizes understanding.

## 2020-11-20 ENCOUNTER — Telehealth: Payer: Self-pay

## 2020-11-20 ENCOUNTER — Other Ambulatory Visit: Payer: Self-pay

## 2020-11-20 DIAGNOSIS — Z794 Long term (current) use of insulin: Secondary | ICD-10-CM

## 2020-11-20 DIAGNOSIS — E1140 Type 2 diabetes mellitus with diabetic neuropathy, unspecified: Secondary | ICD-10-CM

## 2020-11-20 MED ORDER — FREESTYLE LIBRE 2 SENSOR MISC: 2.0000 | 3 refills | Status: DC

## 2020-11-20 MED ORDER — FREESTYLE LIBRE 2 READER DEVI: 1.0000 | Freq: Three times a day (TID) | 0 refills | Status: DC

## 2020-11-20 NOTE — Telephone Encounter (Signed)
Pamela [name] called with concerns of low blood sugars in the early morning hours. Her Toujeo was adjusted to 25 units when she came in on 3/14. Her evening blood sugar's that night wer 228 and 292. On 3/15 AM she dropped to 48, she had breakfast and her BS went to 114.  Later that after it was 276 and 307 at dinner, 10 pm 272. 11/18/2020 6:15 AM BS 47, 9:15 74, 12:30  176, 10 pm 299. 11/19/2020 3:50 AM she awakened weak and sweaty with a blood sugar of 43.  She ate crackers and OJ, 7:51 92, 5:13 pm 296,  9:05 pm 385.  11/20/2020 2:30 am BS 120, 5:25 AM BS 50, 8:30 AM BS 67.  What do you recommend?

## 2020-11-20 NOTE — Telephone Encounter (Cosign Needed)
  Chronic Care Management   Note  11/20/2020 Name: Pamela [name] MRN: [id number] DOB: 07-10-1946  Pharmacist coordinating approval and eligibility of Freestyle Libre 2 with [place]. [facility] is not preferred with [place] Medicare Advantage.  Pharmacist contacted [employer] to determine cost and appropriate DME company for [name]. Per Human representative Freestyle Libre 2 will be covered 100% for patient. Pharmacist reached out to [place] [phone] and was advised that the patient would have to initiate the account set up to get it ordered. Pharmacist requested Dr. Alyse Low nurse to send orders to [facility]. Pharmacist advised patient to contact [facility] to set up account. Patient states she will. Pharmacist coordinated a sample of Libre 2 Sensor for patient to pick up in office. Patient's current sensor will run out 03/25.   Patient having low blood sugar and insulin dosing is being adjusted by PCP and pharmacist today. Patient having elevated blood sugar between lunch and supper with low blood sugars overnight. She is not currently using mealtime insulin due to recent low blood sugars. She is waiting on [name] instructions from PCP today before taking her dose. Patient advised to monitor carbohydrates with meals and snacks. Provided with recommendation to balance carbohydrates with a protein and fat to avoid frequent swings in blood sugars readings.   Patient has been using [name] in the morning this week. [name] and pharmacist consulted and reduced basal insulin to 35 units once daily and Novolog 4 units before lunch and supper if blood sugar >150 mg/dL. Advised patient that [name] is on call this weekend if she needs anything.   Home BP:  11/16/2020 167/83 pulse 58 (pm) 11/17/2020  175/80 pulse 51 (am) 197/77 pulse 56 (pm) 198/83 pulse 56 (hs) 11/19/2020 150/74 pulse 53 (am) 204/92 pulse 57 (pm)     11/20/2020 211/80 pulse 53 (am)  Per [name] increase lisinopril 40 mg twice daily and hold carvedilol. Pharmacist to follow-up next week with bp readings. Patient acknowledges understanding.    Sherre Poot, PharmD, [facility] Clinical Pharmacist [facility] [phone] (office) [phone] (mobile)

## 2020-11-20 NOTE — Telephone Encounter (Signed)
Drop toujeo to 35 U.  Novolog 4 U before lunch and supper if >150.  kc

## 2020-11-21 ENCOUNTER — Encounter: Payer: Self-pay | Admitting: Family Medicine

## 2020-11-21 NOTE — Telephone Encounter (Signed)
Despite reducing basal insulin to 35 units once daily and Novolog 4 units before lunch and supper if blood sugar >150 mg/dL. Her sugars were again low this morning. I adjusted her insulin to 25 U daily and continue novolog as above.   Pulse is 62, bp is 170s/80s still. Continue lisinopril 40 mg twice daily and continue to hold carvedilol.   Pt given after hours line as she initially contacted me via my facebook messenger, which I do not usually see.   Pt verbalizes understanding.

## 2020-11-21 NOTE — Progress Notes (Signed)
Subjective:  Patient ID: Pamela [name], female    DOB: [birth year]  Age: 75 y.o. MRN: [id number]  Chief Complaint  Patient presents with  . Hypertension    She comes in for comparison of her home bp cuff.     HPI Patient presented to check her blood pressure cuff.  At her last visit her blood pressure cuff had been reading her blood pressure at home very high and yet they were normal here.  Upon comparison her blood pressure cuff is running about 20 points higher than ours.  In addition she presents to be taught how to use the freestyle libre 2.  This was applied.  Her A1C was high.   Current Outpatient Medications on File Prior to Visit  Medication Sig Dispense Refill  . acetaminophen (TYLENOL) 500 MG tablet Take 500-1,000 mg by mouth every 6 (six) hours as needed (for pain.).    Marland Kitchen albuterol (VENTOLIN HFA) 108 (90 Base) MCG/ACT inhaler Inhale 1-2 puffs into the lungs every 6 (six) hours as needed for wheezing or shortness of breath.    Marland Kitchen ascorbic acid (VITAMIN C) 500 MG tablet Take 500 mg by mouth.    Marland Kitchen aspirin EC 81 MG tablet Take 1 tablet (81 mg total) by mouth daily. 90 tablet 3  . atorvastatin (LIPITOR) 40 MG tablet Take 1 tablet (40 mg total) by mouth daily at 12 noon. 90 tablet 3  . azelastine (ASTELIN) 137 MCG/SPRAY nasal spray Place 2 sprays into the nose daily as needed for rhinitis or allergies.    . B Complex-C (B-COMPLEX WITH VITAMIN C) tablet Take 1 tablet by mouth daily with lunch.    . carvedilol (COREG) 25 MG tablet TAKE 1 TABLET(25 MG) BY MOUTH TWICE DAILY WITH A MEAL (Patient not taking: Reported on 11/20/2020) 180 tablet 0  . Cholecalciferol (VITAMIN D3) 50 MCG (2000 UT) TABS Take 2,000 Units by mouth daily with lunch.     . denosumab (PROLIA) 60 MG/ML SOSY injection Inject 60 mg into the skin every 6 (six) months.    Marland Kitchen estradiol (ESTRACE) 0.1 MG/GM vaginal cream Uses twice weekly    . ferrous sulfate 325 (65 FE) MG tablet Take 325 mg by mouth daily.    .  furosemide (LASIX) 20 MG tablet Take 1 tablet (20 mg total) by mouth daily. 90 tablet 3  . ipratropium-albuterol (DUONEB) 0.5-2.5 (3) MG/3ML SOLN Take 3 mLs by nebulization 4 (four) times daily as needed (wheezing/shortness of breath).  0  . lisinopril (ZESTRIL) 20 MG tablet Take 2 tablets (40 mg total) by mouth 2 (two) times daily. 180 tablet 0  . Magnesium Oxide 400 (240 Mg) MG TABS TAKE 2 TABLETS(800 MG) BY MOUTH TWICE DAILY 120 tablet 2  . Menthol-Methyl Salicylate (SALONPAS PAIN RELIEF PATCH) PTCH Apply 1 patch topically daily as needed (pain.).    Marland Kitchen methocarbamol (ROBAXIN) 500 MG tablet Take 500 mg by mouth every 8 (eight) hours as needed for muscle spasms.    . ondansetron (ZOFRAN-ODT) 8 MG disintegrating tablet Take 8 mg by mouth every 8 (eight) hours as needed for nausea or vomiting.     Marland Kitchen oxyCODONE (OXY IR/ROXICODONE) 5 MG immediate release tablet Take 1 tablet (5 mg total) by mouth every 6 (six) hours as needed for severe pain. 20 tablet 0  . pantoprazole (PROTONIX) 40 MG tablet Take 1 tablet (40 mg total) by mouth every morning. 90 tablet 1  . polyethylene glycol (MIRALAX / GLYCOLAX) 17 g packet Take  17 g by mouth as needed. Uses 1-2 times weekly    . pregabalin (LYRICA) 25 MG capsule Take 1 capsule (25 mg total) by mouth 3 (three) times daily. 90 capsule 0  . senna-docusate (SENOKOT-S) 8.6-50 MG tablet Take 1 tablet by mouth at bedtime.    . sodium chloride (OCEAN) 0.65 % SOLN nasal spray Place 1-2 sprays into both nostrils 4 (four) times daily as needed for congestion.    Marland Kitchen tolterodine (DETROL LA) 2 MG 24 hr capsule Take 2 mg by mouth at bedtime.     Marland Kitchen warfarin (COUMADIN) 1 MG tablet Take 1 tablet (1 mg total) by mouth See admin instructions. Take as directed (Patient taking differently: Take 3 mg by mouth See admin instructions. Mon-Wed-Fri) 120 tablet 2  . warfarin (COUMADIN) 5 MG tablet Take 5 mg by mouth daily. Lydia Guiles, Thurs and Sat     No current facility-administered  medications on file prior to visit.   Past Medical History:  Diagnosis Date  . Anemia   . Asthma   . Atrial fibrillation [I48.91] 10/01/2014  . Bilateral leg cramps   . Chronic anticoagulation   . Chronic constipation   . Closed bimalleolar fracture of left ankle 02/26/2020  . Complication of anesthesia   . Deep venous thrombosis ([place]) 07/30/2013  . Diabetes mellitus   . Dizziness 05/23/2011  . DJD (degenerative joint disease)   . DOE (dyspnea on exertion) 03/03/2013   Onset early 2020  - 02/05/2019   Walked RA  2 laps @  approx 274ft each @ nl pace  stopped due to  Light headed, no sp or sob, no desats    . DVT (deep venous thrombosis) ([place]) 07/30/2013  . Dyspnea    occasionally  . Dyspnea on exertion 03/03/2013   Onset early 2020  - 02/05/2019   Walked RA  2 laps @  approx 233ft each @ nl pace  stopped due to  Light headed, no sp or sob, no desats    . Encounter for orthopedic follow-up care 03/10/2020  . GERD (gastroesophageal reflux disease)   . Heart murmur    as a child  . HTN (hypertension)   . Hypercholesteremia   . Hypertension 05/23/2011  . Hyponatremia 02/06/2019   Na 126 on no obvious meds to cause it with last na 133 in 2014   . Long term (current) use of anticoagulants 03/03/2013  . Low back pain 07/06/2018  . Low back strain 08/09/2018  . Neck pain 06/11/2020  . Obesity   . Osteoarthritis of right knee 07/06/2018  . Pain in right knee 07/06/2018  . Pneumonia   . PONV (postoperative nausea and vomiting)    needs pre-medication  . Protein S deficiency ([place])    prior DVT L knee, L arm DVT,  [facility] West Pugh [facility]  . PVC's (premature ventricular contractions)   . S/P cardiac catheterization 02/2013   Normal coronaries; low normal EF at 50%  . Sleep apnea    can't use the cpap (diagnosed 2017)  . Solitary pulmonary nodule on lung CT 02/06/2019   First noted 01/13/2014 > no change as of 12/21/2018  . Spondylolisthesis, lumbar region 08/09/2018  . Stroke [facility])    TIA in  2018 or 2019  . TIA (transient ischemic attack)   . Transient ischemic attack 07/30/2013  . Ventricular premature beats 05/23/2011   Past Surgical History:  Procedure Laterality Date  . CARPAL TUNNEL RELEASE  1994   bilateral  .  CHOLECYSTECTOMY  1993  . COLONOSCOPY    . FOOT TENDON SURGERY Right   . left knee replacement  2006  . ORIF ANKLE FRACTURE Left 02/26/2020   Procedure: OPEN REDUCTION INTERNAL FIXATION (ORIF) ANKLE FRACTURE;  Surgeon: Nicholes Stairs, MD;  Location: [facility];  Service: Orthopedics;  Laterality: Left;  90 mins  . thumb surgery Left 2018  . TONSILLECTOMY    . TOTAL ABDOMINAL HYSTERECTOMY  1988    Family History  Problem Relation Age of Onset  . Cirrhosis Mother   . Antithrombin III deficiency Mother        multiple emboli  . Ulcerative colitis Mother   . Coronary artery disease Father   . Heart attack Father   . Hypertension Father   . Protein S deficiency Sister        prior DVT  . Protein S deficiency Daughter   . Hypertension Sister   . Hypertension Brother   . Lung cancer Brother   . Heart attack Brother   . Heart attack Sister        age 50   . Stroke Neg Hx   . Colitis Neg Hx   . Colon polyps Neg Hx   . Esophageal cancer Neg Hx   . Liver cancer Neg Hx   . Pancreatic cancer Neg Hx   . Rectal cancer Neg Hx   . Stomach cancer Neg Hx    Social History   Socioeconomic History  . Marital status: Married    Spouse name: [name]  . Number of children: Not on file  . Years of education: Not on file  . Highest education level: Not on file  Occupational History  . Not on file  Tobacco Use  . Smoking status: Never Smoker  . Smokeless tobacco: Never Used  Vaping Use  . Vaping Use: Never used  Substance and Sexual Activity  . Alcohol use: No  . Drug use: No  . Sexual activity: Not on file  Other Topics Concern  . Not on file  Social History Narrative   Lives with spouse in [place].  2 grown daughters.   Retired Glass blower/designer      Social Determinants of Radio broadcast assistant Strain: Not on file  Food Insecurity: No Food Insecurity  . Worried About Charity fundraiser in the Last Year: Never true  . Ran Out of Food in the Last Year: Never true  Transportation Needs: No Transportation Needs  . Lack of Transportation (Medical): No  . Lack of Transportation (Non-Medical): No  Physical Activity: Not on file  Stress: Not on file  Social Connections: Not on file    Review of Systems   Objective:  BP (!) 148/78 (BP Location: Left Arm, Patient Position: Sitting)   Pulse (!) 57   BP/Weight 11/13/2020 11/12/2020 8/41/6606  Systolic BP 301 601 093  Diastolic BP 78 54 62  Wt. (Lbs) - 166 -  BMI - 28.49 -    Physical Exam  Diabetic Foot Exam - Simple   No data filed      Lab Results  Component Value Date   WBC 8.7 11/12/2020   HGB 13.2 11/12/2020   HCT 40.4 11/12/2020   PLT 279 11/12/2020   GLUCOSE 240 (H) 11/12/2020   CHOL 135 11/12/2020   TRIG 96 11/12/2020   HDL 49 11/12/2020   LDLCALC 68 11/12/2020   ALT 65 (H) 11/12/2020   AST 28 11/12/2020   NA 134 11/12/2020  K 4.6 11/12/2020   CL 93 (L) 11/12/2020   CREATININE 1.26 (H) 11/12/2020   BUN 36 (H) 11/12/2020   CO2 26 11/12/2020   TSH 2.020 11/14/2019   INR 2.8 08/07/2020   HGBA1C 9.8 (H) 11/12/2020   MICROALBUR 10 07/29/2020      Assessment & Plan:   1. Type 2 diabetes mellitus with diabetic neuropathy, with long-term current use of insulin ([place]) Reviewed labs with patient.  Cholesterol good.  Liver function slightly abnormal and kidney function abnormal.  A1C very high. Discussed with patient 3/11. Start on novolog 10 U before largest meal.  Increased toujeo to 50 U daily.  Started on free style libre 2.   2. Primary hypertension  [name] advised that she purchase a new home bp cuff.  She is going to continue to monitor her bp's at home.   Follow-up: Return in about 26 days (around 12/09/2020).  An After Visit Summary  was printed and given to the patient.  [name], MD [facility] [phone]

## 2020-11-23 ENCOUNTER — Telehealth: Payer: Self-pay

## 2020-11-23 ENCOUNTER — Telehealth: Payer: Medicare PPO

## 2020-11-23 NOTE — Telephone Encounter (Signed)
Pt LVM on triage line stating her INR is 1.7 this morning. She will await Kelli's instructions. Message sent to [place], [place].

## 2020-11-23 NOTE — Telephone Encounter (Cosign Needed)
  Chronic Care Management   Note  11/23/2020 Name: Pamela [name] MRN: [id number] DOB: Jun 16, 1946  Pharmacist reached out to patient to follow-up on blood pressure and blood sugar readings. Patient reports taking lisinopril 40 mg bid  Patient reports that her last few bp readings recorded were 199/85, 187/68 and 194/72. Pulse was 59, 64 and 72.   Patient's blood sugar has continued to fluctuate over the weekend. Patient is eating a snack before bed to avoid low blood sugar.   Fasting blood sugar: 74, 83, 153  Post-prandial/overnight readings have varied: 305, 285, 194, 165, 203, 125, 202, 324, 165, 164  Patient is taking Toujeo 25 units daily in the morning and Novolog 4 units prior to lunch and supper at this time.   Pharmacist will provide [name] with readings and update patient on Dr. Cox's recommendation.   Follow up plan: Telephone follow up appointment with care management team member scheduled for:12/09/2020  Sherre Poot, PharmD, [facility] Clinical Pharmacist [facility] [phone] (office) [phone] (mobile)

## 2020-11-24 ENCOUNTER — Telehealth: Payer: Self-pay | Admitting: Hematology and Oncology

## 2020-11-24 NOTE — Telephone Encounter (Cosign Needed)
Pharmacist trained patient on [employer] 2 application and app set up. Patient demonstrated understanding and will contact pharmacist with any questions on applying sensor in 2 weeks. Pharmacist will coordinate sample if needed at that time. Patient has set up account with DME company who is supposed to supply [employer] 2.   Sherre Poot, PharmD, [facility] Clinical Pharmacist [facility] [phone] (office) [phone] (mobile)

## 2020-11-24 NOTE — Telephone Encounter (Signed)
Pamela [name] acknowledges understanding of Dr. Alyse Low recommendations. She reports blood pressure this am was 194/86 mmHg and pulse 69 bpm.   Blood sugar:  HS - 369 Overnight ~3 am - 229 Fasting  @ 7 am - 89 mg/dL  Advised patient to call with any questions or concerns. Patient coming in office this afternoon to learn how to apply the [place] 2 sensor.

## 2020-11-24 NOTE — Telephone Encounter (Signed)
Telephoned patient, INR was 1.7 yesterday on Coumadin 3mg  M,W,F and 5mg  rest of days. Advised her to change to Coumadin 3mg  T,Th and 5mg  rest of days and repeat INR in 1 week. The patient verbalized understanding.

## 2020-11-24 NOTE — Telephone Encounter (Signed)
Kelli,PA, called pt.

## 2020-11-24 NOTE — Telephone Encounter (Signed)
Increase novolog to 8 U before meals if sugars > 150. Continue toujeo at 25 U daily.  Continue lisinopril 40 mg one twice a day.  Restart amlodipine 5 mg 1/2 pill daily.

## 2020-11-30 ENCOUNTER — Telehealth: Payer: Self-pay

## 2020-11-30 NOTE — Telephone Encounter (Signed)
The patient's home INR was 3.3 this morning.  She continues Coumadin 3 mg Tuesday and Thursday and 5 mg the rest of the days.  She states she has not had any greens in the past week and usually includes some her diet.  Therefore, we will continue the same Coumadin and plan to repeat an INR in 1 week.  The patient verbalizes understanding.

## 2020-11-30 NOTE — Telephone Encounter (Signed)
Sent note to [facility].

## 2020-12-04 DIAGNOSIS — I4891 Unspecified atrial fibrillation: Secondary | ICD-10-CM | POA: Diagnosis not present

## 2020-12-04 DIAGNOSIS — M5030 Other cervical disc degeneration, unspecified cervical region: Secondary | ICD-10-CM | POA: Diagnosis not present

## 2020-12-04 DIAGNOSIS — N390 Urinary tract infection, site not specified: Secondary | ICD-10-CM | POA: Diagnosis not present

## 2020-12-04 DIAGNOSIS — Z7901 Long term (current) use of anticoagulants: Secondary | ICD-10-CM | POA: Diagnosis not present

## 2020-12-04 DIAGNOSIS — N3946 Mixed incontinence: Secondary | ICD-10-CM | POA: Diagnosis not present

## 2020-12-04 DIAGNOSIS — M5136 Other intervertebral disc degeneration, lumbar region: Secondary | ICD-10-CM | POA: Diagnosis not present

## 2020-12-04 DIAGNOSIS — N952 Postmenopausal atrophic vaginitis: Secondary | ICD-10-CM | POA: Diagnosis not present

## 2020-12-07 ENCOUNTER — Telehealth: Payer: Self-pay

## 2020-12-07 ENCOUNTER — Telehealth: Payer: Medicare PPO

## 2020-12-07 ENCOUNTER — Other Ambulatory Visit: Payer: Medicare PPO

## 2020-12-07 DIAGNOSIS — D6859 Other primary thrombophilia: Secondary | ICD-10-CM | POA: Diagnosis not present

## 2020-12-07 DIAGNOSIS — Z7901 Long term (current) use of anticoagulants: Secondary | ICD-10-CM | POA: Diagnosis not present

## 2020-12-07 NOTE — Telephone Encounter (Signed)
INR 2.2 today. Patient knows to continue Coumadin 3mg  T,Th and 5mg  rest of days. Will repeat INR on 12/21/20

## 2020-12-07 NOTE — Telephone Encounter (Signed)
Forwarding this message to [facility].

## 2020-12-08 ENCOUNTER — Telehealth: Payer: Self-pay

## 2020-12-08 NOTE — Telephone Encounter (Cosign Needed)
  Chronic Care Management   Note  12/08/2020 Name: Pamela [name] MRN: [id number] DOB: 06-Oct-1945  Narya called letting us know that her [name] sensor needs to be changed today. Her supply has not arrived at this time. Pharmacist procured an additional sample for patient. Patient will be by to pick up sample today. Patient is going out of town next week.   Patient reports blood sugar readings are much better 79, 163, 116, 256 (after supper), 153 were recent readings.   Patient reports blood pressure is concerning since bp was 161/94 with pulse of 54 and 149/80 pulse 73. She has a new blood pressure monitor and it is showing irregular heart beat on both of these readings. Pharmacist encouraged patient to let her cardiologist know. Patient reports that she plans to call the cardiologist today. She also reports an appointment scheduled with cardiology in 2 weeks.   Patient reports that the urologist wants to start her on Gemtesa and Bactrim. Patient had questions about Gemtesa's effect on INR. Pharmacist reviewed package insert and drug information software without yielding any evidence of concern with INR. Discussed that Bactrim can impact INR and patient should contact Dr. Jaclyn Shaggy office to discuss any recommendations on warfarin dose adjustments. Patient acknowledges understanding.   Sherre Poot, PharmD, [facility] Clinical Pharmacist [facility] [phone] (office) [phone] (mobile)

## 2020-12-09 ENCOUNTER — Other Ambulatory Visit: Payer: Self-pay

## 2020-12-09 ENCOUNTER — Encounter: Payer: Self-pay | Admitting: Legal Medicine

## 2020-12-09 ENCOUNTER — Telehealth: Payer: Medicare PPO

## 2020-12-09 ENCOUNTER — Other Ambulatory Visit: Payer: Medicare PPO

## 2020-12-09 ENCOUNTER — Ambulatory Visit: Payer: Medicare PPO | Admitting: Family Medicine

## 2020-12-09 ENCOUNTER — Ambulatory Visit (INDEPENDENT_AMBULATORY_CARE_PROVIDER_SITE_OTHER): Payer: Medicare PPO | Admitting: Legal Medicine

## 2020-12-09 VITALS — BP 138/60 | HR 80 | Temp 96.5°F | Resp 16 | Ht 64.0 in | Wt 174.2 lb

## 2020-12-09 DIAGNOSIS — N1832 Chronic kidney disease, stage 3b: Secondary | ICD-10-CM

## 2020-12-09 DIAGNOSIS — D6859 Other primary thrombophilia: Secondary | ICD-10-CM | POA: Diagnosis not present

## 2020-12-09 DIAGNOSIS — I5032 Chronic diastolic (congestive) heart failure: Secondary | ICD-10-CM | POA: Diagnosis not present

## 2020-12-09 DIAGNOSIS — E1159 Type 2 diabetes mellitus with other circulatory complications: Secondary | ICD-10-CM | POA: Diagnosis not present

## 2020-12-09 DIAGNOSIS — E1122 Type 2 diabetes mellitus with diabetic chronic kidney disease: Secondary | ICD-10-CM | POA: Diagnosis not present

## 2020-12-09 DIAGNOSIS — E119 Type 2 diabetes mellitus without complications: Secondary | ICD-10-CM | POA: Insufficient documentation

## 2020-12-09 DIAGNOSIS — Z794 Long term (current) use of insulin: Secondary | ICD-10-CM | POA: Diagnosis not present

## 2020-12-09 HISTORY — DX: Chronic diastolic (congestive) heart failure: I50.32

## 2020-12-09 LAB — COMPREHENSIVE METABOLIC PANEL
ALT: 22 IU/L (ref 0–32)
AST: 23 IU/L (ref 0–40)
Albumin/Globulin Ratio: 1.7 (ref 1.2–2.2)
Albumin: 3.9 g/dL (ref 3.7–4.7)
Alkaline Phosphatase: 73 IU/L (ref 44–121)
BUN/Creatinine Ratio: 20 (ref 12–28)
BUN: 20 mg/dL (ref 8–27)
Bilirubin Total: 0.4 mg/dL (ref 0.0–1.2)
CO2: 23 mmol/L (ref 20–29)
Calcium: 9.1 mg/dL (ref 8.7–10.3)
Chloride: 97 mmol/L (ref 96–106)
Creatinine, Ser: 1.01 mg/dL — ABNORMAL HIGH (ref 0.57–1.00)
Globulin, Total: 2.3 g/dL (ref 1.5–4.5)
Glucose: 129 mg/dL — ABNORMAL HIGH (ref 65–99)
Potassium: 5.1 mmol/L (ref 3.5–5.2)
Sodium: 135 mmol/L (ref 134–144)
Total Protein: 6.2 g/dL (ref 6.0–8.5)
eGFR: 58 mL/min/{1.73_m2} — ABNORMAL LOW (ref >59)

## 2020-12-09 NOTE — Progress Notes (Signed)
Subjective:  Patient ID: Pamela [name], female    DOB: 07-22-1946  Age: 75 y.o. MRN: [id number]  Chief Complaint  Patient presents with  . Diabetes    HPI: uncontrolled DM.  She is now on toujeo 35 units in AM, Novolog 8 units lunch and supper.  She was getting hypoglycemia at night and now more controlled. Hypoglycemia x 4- last this.last 77.  Patient present with type 2 diabetes.  Specifically, this is type 2, insulin requiring diabetes, complicated by vascular, renal disease disease.  Compliance with treatment has been good; patient take medicines as directed, maintains diet and exercise regimen, follows up as directed, and is keeping glucose diary.  Date of  diagnosis 32.  Depression screen has been performed.Tobacco screen nonsmoker. Current medicines for diabetes toujeo and novolog.  Patient is on lisinopril for renal protection and atorvastatin for cholesterol control.  Patient performs foot exams daily and last ophthalmologic exam was yes.  She has early macular degeneration.  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  Patient presents with HFPEF  that is stable. Diagnosis made 15.  The course of the disease is stable.  Current medicines include lisinopril, furosemide BID. Patient follows a low cholesterol diet and maintains a weight diary.  Patient is on low salt, low cholesterol diet and avoids alcohol.  Patient denies adverse effects of medicines. Patient is monitoring weight and has wegiht stable of weight.  Patient is having no pedal edema, no PND and no PND.  Patient is continuing to see cardiology.    Current Outpatient Medications on File Prior to Visit  Medication Sig Dispense Refill  . sulfamethoxazole-trimethoprim (BACTRIM DS) 800-160 MG tablet Take 1 tablet by mouth 2 (two) times daily.    . Vibegron (GEMTESA PO) Take by mouth at bedtime.    Marland Kitchen acetaminophen (TYLENOL) 500 MG tablet Take  500-1,000 mg by mouth every 6 (six) hours as needed (for pain.).    Marland Kitchen albuterol (VENTOLIN HFA) 108 (90 Base) MCG/ACT inhaler Inhale 1-2 puffs into the lungs every 6 (six) hours as needed for wheezing or shortness of breath.    Marland Kitchen ascorbic acid (VITAMIN C) 500 MG tablet Take 500 mg by mouth.    Marland Kitchen aspirin EC 81 MG tablet Take 1 tablet (81 mg total) by mouth daily. 90 tablet 3  . atorvastatin (LIPITOR) 40 MG tablet Take 1 tablet (40 mg total) by mouth daily at 12 noon. 90 tablet 3  . azelastine (ASTELIN) 137 MCG/SPRAY nasal spray Place 2 sprays into the nose daily as needed for rhinitis or allergies.    . B Complex-C (B-COMPLEX WITH VITAMIN C) tablet Take 1 tablet by mouth daily with lunch.    . carvedilol (COREG) 25 MG tablet TAKE 1 TABLET(25 MG) BY MOUTH TWICE DAILY WITH A MEAL (Patient not taking: Reported on 11/20/2020) 180 tablet 0  . Cholecalciferol (VITAMIN D3) 50 MCG (2000 UT) TABS Take 2,000 Units by mouth daily with lunch.     . Continuous Blood Gluc Receiver (FREESTYLE LIBRE 2 READER) DEVI 1 each by Does not apply route 4 (four) times daily - after meals and at bedtime. E11.40 1 each 0  . Continuous Blood Gluc Sensor (FREESTYLE LIBRE 2 SENSOR) MISC 2 each by Does not apply route every 14 (fourteen) days. E11.40 6 each 3  . denosumab (PROLIA) 60 MG/ML SOSY injection Inject 60 mg into the skin every 6 (six) months.    Marland Kitchen  estradiol (ESTRACE) 0.1 MG/GM vaginal cream Uses twice weekly    . ferrous sulfate 325 (65 FE) MG tablet Take 325 mg by mouth daily.    . furosemide (LASIX) 20 MG tablet Take 1 tablet (20 mg total) by mouth daily. 90 tablet 3  . insulin aspart (NOVOLOG FLEXPEN) 100 UNIT/ML FlexPen 10 U before largest meal (Patient taking differently: Inject 8 Units into the skin. 4 units before lunch lunch and supper) 15 mL 0  . insulin glargine, 1 Unit Dial, (TOUJEO) 300 UNIT/ML Solostar Pen Inject 50 Units into the skin daily. (Patient taking differently: Inject 35 Units into the skin daily.  Reduce to 35 units) 135 mL 1  . Insulin Pen Needle (BD PEN NEEDLE NANO 2ND GEN) 32G X 4 MM MISC USE DAILY AS DIRECTED 100 each 3  . ipratropium-albuterol (DUONEB) 0.5-2.5 (3) MG/3ML SOLN Take 3 mLs by nebulization 4 (four) times daily as needed (wheezing/shortness of breath).  0  . lisinopril (ZESTRIL) 20 MG tablet Take 2 tablets (40 mg total) by mouth 2 (two) times daily. 180 tablet 0  . Magnesium Oxide 400 (240 Mg) MG TABS TAKE 2 TABLETS(800 MG) BY MOUTH TWICE DAILY 120 tablet 2  . Menthol-Methyl Salicylate (SALONPAS PAIN RELIEF PATCH) PTCH Apply 1 patch topically daily as needed (pain.).    Marland Kitchen methocarbamol (ROBAXIN) 500 MG tablet Take 500 mg by mouth every 8 (eight) hours as needed for muscle spasms.    . ondansetron (ZOFRAN-ODT) 8 MG disintegrating tablet Take 8 mg by mouth every 8 (eight) hours as needed for nausea or vomiting.     Marland Kitchen oxyCODONE (OXY IR/ROXICODONE) 5 MG immediate release tablet Take 1 tablet (5 mg total) by mouth every 6 (six) hours as needed for severe pain. 20 tablet 0  . pantoprazole (PROTONIX) 40 MG tablet Take 1 tablet (40 mg total) by mouth every morning. 90 tablet 1  . polyethylene glycol (MIRALAX / GLYCOLAX) 17 g packet Take 17 g by mouth as needed. Uses 1-2 times weekly    . pregabalin (LYRICA) 25 MG capsule Take 1 capsule (25 mg total) by mouth 3 (three) times daily. 90 capsule 0  . senna-docusate (SENOKOT-S) 8.6-50 MG tablet Take 1 tablet by mouth at bedtime.    . sodium chloride (OCEAN) 0.65 % SOLN nasal spray Place 1-2 sprays into both nostrils 4 (four) times daily as needed for congestion.    Marland Kitchen tolterodine (DETROL LA) 2 MG 24 hr capsule Take 2 mg by mouth at bedtime.     Marland Kitchen warfarin (COUMADIN) 1 MG tablet Take 1 tablet (1 mg total) by mouth See admin instructions. Take as directed (Patient taking differently: Take 3 mg by mouth See admin instructions. Mon-Wed-Fri) 120 tablet 2  . warfarin (COUMADIN) 5 MG tablet Take 5 mg by mouth daily. Lydia Guiles, Thurs and Sat      No current facility-administered medications on file prior to visit.   Past Medical History:  Diagnosis Date  . Anemia   . Asthma   . Atrial fibrillation [I48.91] 10/01/2014  . Bilateral leg cramps   . Chronic anticoagulation   . Chronic constipation   . Closed bimalleolar fracture of left ankle 02/26/2020  . Complication of anesthesia   . Deep venous thrombosis ([place]) 07/30/2013  . Diabetes mellitus   . Dizziness 05/23/2011  . DJD (degenerative joint disease)   . DOE (dyspnea on exertion) 03/03/2013   Onset early 2020  - 02/05/2019   Walked RA  2 laps @  approx  281ft each @ nl pace  stopped due to  Light headed, no sp or sob, no desats    . DVT (deep venous thrombosis) ([place]) 07/30/2013  . Dyspnea    occasionally  . Dyspnea on exertion 03/03/2013   Onset early 2020  - 02/05/2019   Walked RA  2 laps @  approx 275ft each @ nl pace  stopped due to  Light headed, no sp or sob, no desats    . Encounter for orthopedic follow-up care 03/10/2020  . GERD (gastroesophageal reflux disease)   . Heart murmur    as a child  . HTN (hypertension)   . Hypercholesteremia   . Hypertension 05/23/2011  . Hyponatremia 02/06/2019   Na 126 on no obvious meds to cause it with last na 133 in 2014   . Long term (current) use of anticoagulants 03/03/2013  . Low back pain 07/06/2018  . Low back strain 08/09/2018  . Neck pain 06/11/2020  . Obesity   . Osteoarthritis of right knee 07/06/2018  . Pain in right knee 07/06/2018  . Pneumonia   . PONV (postoperative nausea and vomiting)    needs pre-medication  . Protein S deficiency ([place])    prior DVT L knee, L arm DVT,  followed by Dr West Pugh at [facility]  . PVC's (premature ventricular contractions)   . S/P cardiac catheterization 02/2013   Normal coronaries; low normal EF at 50%  . Sleep apnea    can't use the cpap (diagnosed 2017)  . Solitary pulmonary nodule on lung CT 02/06/2019   First noted 01/13/2014 > no change as of 12/21/2018  . Spondylolisthesis, lumbar region  08/09/2018  . Stroke [facility])    TIA in 2018 or 2019  . TIA (transient ischemic attack)   . Transient ischemic attack 07/30/2013  . Ventricular premature beats 05/23/2011   Past Surgical History:  Procedure Laterality Date  . CARPAL TUNNEL RELEASE  1994   bilateral  . CHOLECYSTECTOMY  1993  . COLONOSCOPY    . FOOT TENDON SURGERY Right   . left knee replacement  2006  . ORIF ANKLE FRACTURE Left 02/26/2020   Procedure: OPEN REDUCTION INTERNAL FIXATION (ORIF) ANKLE FRACTURE;  Surgeon: Nicholes Stairs, MD;  Location: [facility];  Service: Orthopedics;  Laterality: Left;  90 mins  . thumb surgery Left 2018  . TONSILLECTOMY    . TOTAL ABDOMINAL HYSTERECTOMY  1988    Family History  Problem Relation Age of Onset  . Cirrhosis Mother   . Antithrombin III deficiency Mother        multiple emboli  . Ulcerative colitis Mother   . Coronary artery disease Father   . Heart attack Father   . Hypertension Father   . Protein S deficiency Sister        prior DVT  . Protein S deficiency Daughter   . Hypertension Sister   . Hypertension Brother   . Lung cancer Brother   . Heart attack Brother   . Heart attack Sister        age 54   . Stroke Neg Hx   . Colitis Neg Hx   . Colon polyps Neg Hx   . Esophageal cancer Neg Hx   . Liver cancer Neg Hx   . Pancreatic cancer Neg Hx   . Rectal cancer Neg Hx   . Stomach cancer Neg Hx    Social History   Socioeconomic History  . Marital status: Married    Spouse name: [name]  .  Number of children: Not on file  . Years of education: Not on file  . Highest education level: Not on file  Occupational History  . Not on file  Tobacco Use  . Smoking status: Never Smoker  . Smokeless tobacco: Never Used  Vaping Use  . Vaping Use: Never used  Substance and Sexual Activity  . Alcohol use: No  . Drug use: No  . Sexual activity: Not on file  Other Topics Concern  . Not on file  Social History Narrative   Lives with spouse in [place].  2 grown  daughters.   Retired Glass blower/designer     Social Determinants of Radio broadcast assistant Strain: Not on file  Food Insecurity: No Food Insecurity  . Worried About Charity fundraiser in the Last Year: Never true  . Ran Out of Food in the Last Year: Never true  Transportation Needs: No Transportation Needs  . Lack of Transportation (Medical): No  . Lack of Transportation (Non-Medical): No  Physical Activity: Not on file  Stress: Not on file  Social Connections: Not on file    Review of Systems  Constitutional: Negative for activity change and appetite change.  HENT: Negative.   Eyes: Negative for visual disturbance.  Respiratory: Negative for chest tightness and shortness of breath.   Cardiovascular: Negative for chest pain, palpitations and leg swelling.  Gastrointestinal: Negative.  Negative for abdominal distention and abdominal pain.  Endocrine: Negative for polyuria.  Genitourinary: Negative.   Musculoskeletal: Negative for arthralgias.  Skin: Negative.   Neurological: Negative.   Psychiatric/Behavioral: Negative.      Objective:  BP 138/60   Pulse 80   Temp (!) 96.5 F (35.8 C)   Resp 16   Ht 5\' 4"  (1.626 m)   Wt 174 lb 3.2 oz (79 kg)   BMI 29.90 kg/m   BP/Weight 12/09/2020 11/13/2020 02/09/3015  Systolic BP 010 932 355  Diastolic BP 60 78 54  Wt. (Lbs) 174.2 - 166  BMI 29.9 - 28.49    Physical Exam Vitals reviewed.  Constitutional:      Appearance: Normal appearance.  HENT:     Right Ear: Tympanic membrane normal.     Left Ear: Tympanic membrane normal.  Eyes:     Conjunctiva/sclera: Conjunctivae normal.     Pupils: Pupils are equal, round, and reactive to light.  Cardiovascular:     Rate and Rhythm: Normal rate and regular rhythm.     Pulses: Normal pulses.     Heart sounds: No murmur heard. No gallop.   Pulmonary:     Effort: Pulmonary effort is normal. No respiratory distress.     Breath sounds: Normal breath sounds.  Abdominal:      General: Abdomen is flat. Bowel sounds are normal.  Musculoskeletal:        General: Normal range of motion.     Cervical back: Normal range of motion and neck supple.     Right lower leg: No edema.     Left lower leg: No edema.  Skin:    General: Skin is warm and dry.     Capillary Refill: Capillary refill takes less than 2 seconds.  Neurological:     General: No focal deficit present.     Mental Status: She is alert and oriented to person, place, and time. Mental status is at baseline.       Lab Results  Component Value Date   WBC 8.7 11/12/2020   HGB 13.2  11/12/2020   HCT 40.4 11/12/2020   PLT 279 11/12/2020   GLUCOSE 240 (H) 11/12/2020   CHOL 135 11/12/2020   TRIG 96 11/12/2020   HDL 49 11/12/2020   LDLCALC 68 11/12/2020   ALT 65 (H) 11/12/2020   AST 28 11/12/2020   NA 134 11/12/2020   K 4.6 11/12/2020   CL 93 (L) 11/12/2020   CREATININE 1.26 (H) 11/12/2020   BUN 36 (H) 11/12/2020   CO2 26 11/12/2020   TSH 2.020 11/14/2019   INR 2.8 08/07/2020   HGBA1C 9.8 (H) 11/12/2020   MICROALBUR 10 07/29/2020      Assessment & Plan:   1. Type 2 diabetes mellitus with vascular disease ([place]) An individual care plan for diabetes was established and reinforced today.  The patient's status was assessed using clinical findings on exam, labs and diagnostic testing. Patient success at meeting goals based on disease specific evidence-based guidelines and found to be fair controlled. Medications were assessed and patient's understanding of the medical issues , including barriers were assessed. Recommend adherence to a diabetic diet, a graduated exercise program, HgbA1c level is checked quarterly, and urine microalbumin performed yearly .  Annual mono-filament sensation testing performed. Lower blood pressure and control hyperlipidemia is important. Get annual eye exams and annual flu shots and smoking cessation discussed.  Self management goals were discussed. She is improving on  toujheo 35 uniis and 4 units morning and noon  2. Chronic diastolic congestive heart failure [facility]) An individualized care plan was established and reinforced.  The patient's disease status was assessed using clinical finding son exam today, labs, and/or other diagnostic testing such as x-rays, to determine the patient's success in meeting treatmentgoalsbased on disease-based guidelines and found to beimproved. But not at goal yet. Medications prescriptions no changes Laboratory tests ordered to be performed today include routine. RECOMMENDATIONS: given include see cardiology.  Call physician is patient gains 3 lbs in one day or 5 lbs for one week.  Call for progressive PND, orthopnea or increased pedal edema.  3. Protein S deficiency ([place]) Patient has protein S deficiency and is on chronic anticoagulation for this  4. Type 2 diabetes mellitus with stage 3b chronic kidney disease, with long-term current use of insulin ([facility])  Patent also has DM with stage 3 chronic renal disease         Follow-up: Return in about 3 months (around 03/10/2021).  An After Visit Summary was printed and given to the patient.  Reinaldo Meeker, MD [facility] [phone]

## 2020-12-10 NOTE — Progress Notes (Signed)
Glucose 129, kidney tests improving, liver tests normal, potassium normal lp

## 2020-12-14 ENCOUNTER — Telehealth: Payer: Medicare PPO

## 2020-12-21 ENCOUNTER — Telehealth: Payer: Self-pay

## 2020-12-21 NOTE — Telephone Encounter (Signed)
Pt is taking Coumadin 3mg  three times week, 5mg  the there days.

## 2020-12-22 ENCOUNTER — Other Ambulatory Visit: Payer: Self-pay | Admitting: Family Medicine

## 2020-12-22 NOTE — Telephone Encounter (Signed)
Hello Pamela [name], This pt is managed by your office for her coumadin. Pharmacy sent [place] rx. Thanks, Elnita Maxwell

## 2020-12-22 NOTE — Progress Notes (Signed)
Chronic Care Management Pharmacy Note  01/01/2021 Name:  Pamela [name] MRN:  [id number] DOB:  08/03/1946  Plan Updates:   Patient experiencing frequent low blood sugars overnight and large swings in blood sugar. Recommended change Novolog dose to 5 units if blood sugar <150 mg/dL or 10 units >150 mg/dL with lunch and supper. Reduce Toujeo to 30 units daily. Collaborated with [name] for approval on above dose changes. Patient aware.   Invited patient to clinic [place] account. She is on vacation currently and will attempt to share readings for pharmacist and Dr [name] to review.    Subjective: Pamela [name] is an 75 y.o. year old female who is a primary patient of Cox, Kirsten, MD.  The CCM team was consulted for assistance with disease management and care coordination needs.    Engaged with patient by telephone for follow up visit in response to provider referral for pharmacy case management and/or care coordination services.   Consent to Services:  The patient was given information about Chronic Care Management services, agreed to services, and gave verbal consent prior to initiation of services.  Please see initial visit note for detailed documentation.   Patient Care Team: [name], MD as PCP - General (Family Medicine) Berniece Salines, DO as PCP - Cardiology (Cardiology) Marice Potter, MD as Consulting Physician (Oncology) Ishmael Holter, OD (Optometry) [facility], [facility] (Specialist) Tanda Rockers, MD as Consulting Physician (Pulmonary Disease) Rozetta Nunnery, MD as Consulting Physician (Otolaryngology) Berniece Salines, DO as Consulting Physician (Cardiology) Renato Shin, MD as Consulting Physician (Endocrinology) Gavin Pound, MD as Consulting Physician (Rheumatology) Armbruster, Carlota Raspberry, MD as Consulting Physician (Gastroenterology) Burnice Logan, [facility] as Pharmacist (Pharmacist)  Recent office visits: 12/09/2020 - Glucose 129, kidney  tests improving, liver tests normal, potassium normal Recent consult visits:   Hospital visits: None in previous 6 months  Objective:  Lab Results  Component Value Date   CREATININE 1.01 (H) 12/09/2020   BUN 20 12/09/2020   GFR 57.98 (L) 03/10/2020   GFRNONAA 61 09/11/2020   GFRAA 70 09/11/2020   NA 135 12/09/2020   K 5.1 12/09/2020   CALCIUM 9.1 12/09/2020   CO2 23 12/09/2020   GLUCOSE 129 (H) 12/09/2020    Lab Results  Component Value Date/Time   HGBA1C 9.8 (H) 11/12/2020 10:32 AM   HGBA1C 8.6 (H) 08/13/2020 05:01 PM   GFR 57.98 (L) 03/10/2020 04:46 PM   GFR 61.14 02/05/2019 12:26 PM   MICROALBUR 10 07/29/2020 12:01 PM    Last diabetic Eye exam: No results found for: HMDIABEYEEXA  Last diabetic Foot exam: No results found for: HMDIABFOOTEX   Lab Results  Component Value Date   CHOL 135 11/12/2020   HDL 49 11/12/2020   LDLCALC 68 11/12/2020   TRIG 96 11/12/2020   CHOLHDL 2.8 11/12/2020    Hepatic Function Latest Ref Rng & Units 12/09/2020 11/12/2020 08/13/2020  Total Protein 6.0 - 8.5 g/dL 6.2 6.9 6.3  Albumin 3.7 - 4.7 g/dL 3.9 4.2 3.8  AST 0 - 40 IU/L '23 28 30  ' ALT 0 - 32 IU/L 22 65(H) 44(H)  Alk Phosphatase 44 - 121 IU/L 73 98 93  Total Bilirubin 0.0 - 1.2 mg/dL 0.4 0.6 0.4    Lab Results  Component Value Date/Time   TSH 2.020 11/14/2019 12:14 PM   TSH 1.24 02/05/2019 12:26 PM   FREET4 1.23 01/21/2013 12:47 PM    CBC Latest Ref Rng & Units 11/12/2020 09/11/2020 07/24/2020  WBC 3.4 - 10.8 x10E3/uL 8.7 6.5 5.8  Hemoglobin 11.1 - 15.9 g/dL 13.2 12.7 12.5  Hematocrit 34.0 - 46.6 % 40.4 37.6 37.9  Platelets 150 - 450 x10E3/uL 279 247 268    No results found for: VD25OH  Clinical ASCVD: Yes  The ASCVD Risk score Mikey Bussing DC Jr., et al., 2013) failed to calculate for the following reasons:   The patient has a prior MI or stroke diagnosis    Depression screen [facility] 2/9 11/12/2020 06/07/2020  Decreased Interest 0 0  Down, Depressed, Hopeless 0 0  PHQ - 2 Score  0 0  Altered sleeping 0 -  Tired, decreased energy 0 -  Change in appetite 0 -  Feeling bad or failure about yourself  0 -  Trouble concentrating 0 -  Moving slowly or fidgety/restless 0 -  Suicidal thoughts 0 -  PHQ-9 Score 0 -  Difficult doing work/chores Not difficult at all -      Social History   Tobacco Use  Smoking Status Never Smoker  Smokeless Tobacco Never Used   BP Readings from Last 3 Encounters:  12/09/20 138/60  11/13/20 (!) 148/78  11/12/20 (!) 122/54   Pulse Readings from Last 3 Encounters:  12/09/20 80  11/13/20 (!) 57  11/12/20 (!) 57   Wt Readings from Last 3 Encounters:  12/09/20 174 lb 3.2 oz (79 kg)  11/12/20 166 lb (75.3 kg)  09/25/20 172 lb (78 kg)   BMI Readings from Last 3 Encounters:  12/09/20 29.90 kg/m  11/12/20 28.49 kg/m  09/25/20 33.59 kg/m    Assessment/Interventions: Review of patient past medical history, allergies, medications, health status, including review of consultants reports, laboratory and other test data, was performed as part of comprehensive evaluation and provision of chronic care management services.   SDOH:  (Social Determinants of Health) assessments and interventions performed: Yes  SDOH Screenings   Alcohol Screen: Not on file  Depression (PHQ2-9): Low Risk   . PHQ-2 Score: 0  Financial Resource Strain: Not on file  Food Insecurity: No Food Insecurity  . Worried About Charity fundraiser in the Last Year: Never true  . Ran Out of Food in the Last Year: Never true  Housing: Low Risk   . Last Housing Risk Score: 0  Physical Activity: Not on file  Social Connections: Not on file  Stress: Not on file  Tobacco Use: Low Risk   . Smoking Tobacco Use: Never Smoker  . Smokeless Tobacco Use: Never Used  Transportation Needs: No Transportation Needs  . Lack of Transportation (Medical): No  . Lack of Transportation (Non-Medical): No    CCM Care Plan  Allergies  Allergen Reactions  . Ketek [Telithromycin]  Nausea And Vomiting and Rash  . Loxapine Succinate Hives  . Naproxen Sodium Anaphylaxis and Hives  . Amoxapine And Related Hives  . Darvon Nausea And Vomiting  . Belsomra [Suvorexant]     "Nightmares"  . Cymbalta [Duloxetine Hcl]     "Blackouts"  . Gabapentin Other (See Comments)    Blurry vision  . Amoxicillin Rash  . Propoxyphene Nausea And Vomiting    Medications Reviewed Today    Reviewed by Burnice Logan, [facility] (Pharmacist) on 01/01/21 at 1924  Med List Status: <None>  Medication Order Taking? Sig Documenting Provider Last Dose Status Informant  acetaminophen (TYLENOL) 500 MG tablet [id number] Yes Take 500-1,000 mg by mouth every 6 (six) hours as needed (for pain.). [provider] Taking Active Self  albuterol (VENTOLIN  HFA) 108 (90 Base) MCG/ACT inhaler [id number] Yes Inhale 1-2 puffs into the lungs every 6 (six) hours as needed for wheezing or shortness of breath. [provider] Taking Active   ascorbic acid (VITAMIN C) 500 MG tablet [id number] Yes Take 500 mg by mouth. [provider] Taking Active   aspirin EC 81 MG tablet [id number] Yes Take 1 tablet (81 mg total) by mouth daily. Tobb, Kardie, DO Taking Active   atorvastatin (LIPITOR) 40 MG tablet [id number] Yes Take 1 tablet (40 mg total) by mouth daily at 12 noon. Tobb, Kardie, DO Taking Active   azelastine (ASTELIN) 137 MCG/SPRAY nasal spray [id number] Yes Place 2 sprays into the nose daily as needed for rhinitis or allergies. [provider] Taking Active   B Complex-C (B-COMPLEX WITH VITAMIN C) tablet [id number] Yes Take 1 tablet by mouth daily with lunch. [provider] Taking Active Self  carvedilol (COREG) 25 MG tablet [id number] Yes TAKE 1 TABLET(25 MG) BY MOUTH TWICE DAILY WITH A MEAL Cox, Kirsten, MD Taking Active   Cholecalciferol (VITAMIN D3) 50 MCG (2000 UT) TABS [id number] Yes Take 2,000 Units by mouth daily with lunch.  [provider] Taking Active Self  Continuous  Blood Gluc Receiver (FREESTYLE LIBRE 2 READER) [place] [id number] Yes 1 each by Does not apply route 4 (four) times daily - after meals and at bedtime. E11.40 CoxElnita Maxwell, MD Taking Active   Continuous Blood Gluc Sensor (FREESTYLE LIBRE 2 SENSOR) [place] [id number] Yes 2 each by Does not apply route every 14 (fourteen) days. E11.40 Cox, Elnita Maxwell, MD Taking Active   denosumab (PROLIA) 60 MG/ML SOSY injection [id number] Yes Inject 60 mg into the skin every 6 (six) months. [provider] Taking Active Self           Med Note Danne Baxter, Everardo All Mar 05, 2020  9:48 AM)    estradiol (ESTRACE) 0.1 MG/GM vaginal cream [id number] Yes Uses twice weekly [provider] Taking Active   ferrous sulfate 325 (65 FE) MG tablet [id number] Yes Take 325 mg by mouth daily. [provider] Taking Active Self  furosemide (LASIX) 20 MG tablet [id number]  Take 1 tablet (20 mg total) by mouth daily. Tobb, Kardie, DO  Expired 11/12/20 2359   insulin aspart (NOVOLOG FLEXPEN) 100 UNIT/ML FlexPen [id number] Yes 10 U before largest meal  Patient taking differently: Inject 8 Units into the skin. 4 units before lunch lunch and supper   Cox, Elnita Maxwell, MD Taking Active            Med Note Owens Shark, [place]   Fri Jan 01, 2021  7:23 PM) Patient taking 8 units twice daily before lunch and supper.   insulin glargine, 1 Unit Dial, (TOUJEO) 300 UNIT/ML Solostar Pen [id number] Yes Inject 50 Units into the skin daily.  Patient taking differently: Inject 35 Units into the skin daily. Reduce to 35 units   Cox, Kirsten, MD Taking Active   Insulin Pen Needle (BD PEN NEEDLE NANO 2ND GEN) 32G X 4 MM MISC [id number] Yes USE DAILY AS DIRECTED Cox, Kirsten, MD Taking Active   ipratropium-albuterol (DUONEB) 0.5-2.5 (3) MG/3ML SOLN [id number] Yes Take 3 mLs by nebulization 4 (four) times daily as needed (wheezing/shortness of breath). [provider] Taking Active   lisinopril (ZESTRIL) 20 MG tablet [id number] Yes Take 2 tablets (40  mg total) by mouth 2 (two) times daily. [name], MD Taking Active   Magnesium Oxide 400 (240 Mg) MG TABS [id number] Yes TAKE  2 TABLETS(800 MG) BY MOUTH TWICE DAILY Cox, Kirsten, MD Taking Active   Menthol-Methyl Salicylate [facility] PAIN RELIEF PATCH) [place] [id number] Yes Apply 1 patch topically daily as needed (pain.). [provider] Taking Active Self  methocarbamol (ROBAXIN) 500 MG tablet [id number] Yes Take 500 mg by mouth every 8 (eight) hours as needed for muscle spasms. [provider] Taking Active   ondansetron (ZOFRAN-ODT) 8 MG disintegrating tablet [id number] Yes Take 8 mg by mouth every 8 (eight) hours as needed for nausea or vomiting.  [provider] Taking Active Self  oxyCODONE (OXY IR/ROXICODONE) 5 MG immediate release tablet [id number] Yes Take 1 tablet (5 mg total) by mouth every 6 (six) hours as needed for severe pain. Nicholes Stairs, MD Taking Active   pantoprazole (PROTONIX) 40 MG tablet [id number] Yes Take 1 tablet (40 mg total) by mouth every morning. Cox, Kirsten, MD Taking Active   polyethylene glycol (MIRALAX / GLYCOLAX) 17 g packet [id number] Yes Take 17 g by mouth as needed. Uses 1-2 times weekly [provider] Taking Active   pregabalin (LYRICA) 25 MG capsule [id number] Yes Take 1 capsule (25 mg total) by mouth 3 (three) times daily. Cox, Kirsten, MD Taking Active   senna-docusate (SENOKOT-S) 8.6-50 MG tablet [id number] Yes Take 1 tablet by mouth at bedtime. [provider] Taking Active Self  sodium chloride (OCEAN) 0.65 % SOLN nasal spray [id number] Yes Place 1-2 sprays into both nostrils 4 (four) times daily as needed for congestion. [provider] Taking Active Self  sulfamethoxazole-trimethoprim (BACTRIM DS) 800-160 MG tablet [id number] No Take 1 tablet by mouth 2 (two) times daily.  Patient not taking: Reported on 01/01/2021   [provider] Not Taking Active   tolterodine (DETROL LA) 2 MG 24 hr  capsule [id number] No Take 2 mg by mouth at bedtime.   Patient not taking: Reported on 01/01/2021   [provider] Not Taking Active Self  Vibegron (GEMTESA PO) [id number] Yes Take by mouth at bedtime. [provider] Taking Active   warfarin (COUMADIN) 1 MG tablet [id number] Yes Take 1 tablet (1 mg total) by mouth See admin instructions. Take as directed  Patient taking differently: Take 3 mg by mouth See admin instructions. Mon-Wed-Fri   Mosher, Vida Roller A, PA-C Taking Active   warfarin (COUMADIN) 5 MG tablet [id number] Yes TAKE 1 TABLET BY MOUTH EVERY DAY Mosher, Thalia Bloodgood, PA-C Taking Active           Patient Active Problem List   Diagnosis Date Noted  . Type 2 diabetes mellitus with vascular disease ([place]) 12/09/2020  . Chronic diastolic congestive heart failure ([place]) 12/09/2020  . Type 2 diabetes mellitus with stage 3 chronic kidney disease ([place]) 12/09/2020  . PVC's (premature ventricular contractions)   . TIA (transient ischemic attack)   . Neck pain 06/11/2020  . Anemia   . Asthma   . Bilateral leg cramps   . Chronic anticoagulation   . Chronic constipation   . Complication of anesthesia   . DJD (degenerative joint disease)   . Dyspnea   . GERD (gastroesophageal reflux disease)   . Heart murmur   . HTN (hypertension)   . Hypercholesteremia   . Obesity   . Pneumonia   . PONV (postoperative nausea and vomiting)   . Sleep apnea   . Stroke ([place])   . Encounter for orthopedic follow-up care 03/10/2020  . Closed bimalleolar fracture of left ankle 02/26/2020  . Hyponatremia 02/06/2019  . Solitary pulmonary nodule on  lung CT 02/06/2019  . Spondylolisthesis, lumbar region 08/09/2018  . Low back strain 08/09/2018  . Osteoarthritis of right knee 07/06/2018  . Low back pain 07/06/2018  . Pain in right knee 07/06/2018  . Atrial fibrillation [I48.91] 10/01/2014  . DVT (deep venous thrombosis) ([place]) 07/30/2013  . Protein S deficiency ([place]) 03/03/2013  . Long term  (current) use of anticoagulants 03/03/2013  . Dyspnea on exertion 03/03/2013  . DOE (dyspnea on exertion) 03/03/2013  . S/P cardiac catheterization 02/2013  . Ventricular premature beats 05/23/2011  . Dizziness 05/23/2011    Immunization History  Administered Date(s) Administered  . Fluad Quad(high Dose 65+) 05/26/2020  . Influenza,inj,Quad PF,6+ Mos 06/04/2013  . PFIZER(Purple Top)SARS-COV-2 Vaccination 09/22/2019, 10/12/2019, 06/16/2020  . Pneumococcal Conjugate-13 07/30/2014  . Pneumococcal Polysaccharide-23 03/31/2008    Conditions to be addressed/monitored:  Hypertension, Hyperlipidemia and Diabetes  Care Plan : ccm pharmacy care plan  Updates made by Burnice Logan, RPH since 01/01/2021 12:00 AM    Problem: htn, hld, dm   Priority: High  Onset Date: 01/01/2021    Long-Range Goal: Disease State Management   Start Date: 01/01/2021  Expected End Date: 01/01/2022  This Visit's Progress: On track  Priority: High  Note:    Current Barriers:  . Unable to achieve control of blood sugar   Pharmacist Clinical Goal(s):  Marland Kitchen Patient will achieve control of diabetes as evidenced by a1c and blood sugar through collaboration with PharmD and provider.   Interventions: . 1:1 collaboration with [name], MD regarding development and update of comprehensive plan of care as evidenced by provider attestation and co-signature . Inter-disciplinary care team collaboration (see longitudinal plan of care) . Comprehensive medication review performed; medication list updated in electronic medical record  Hypertension (BP goal <130/80) -Controlled -Current treatment: . carvedilol 25 mg bid . Lisinopril 40 mg bid  -Medications previously tried: not reported -Current home readings: none available right now -Current dietary habits: patient having to correct blood sugar with more juice and sugar lately. Trying boost currently.  -Current exercise habits: limited by pain -Denies  hypotensive/hypertensive symptoms -Educated on BP goals and benefits of medications for prevention of heart attack, stroke and kidney damage; Daily salt intake goal < 2300 mg; Importance of home blood pressure monitoring; -Counseled to monitor BP at home daily, document, and provide log at future appointments -Counseled on diet and exercise extensively Recommended to continue current medication  Hyperlipidemia: (LDL goal < 70) -Controlled -Current treatment: . atorvastatin 40 mg daily  -Medications previously tried: none reported  -Current dietary patterns: working on stabilizing blood sugar  -Current exercise habits: limited due to pain -Educated on Cholesterol goals;  Benefits of statin for ASCVD risk reduction; -Counseled on diet and exercise extensively Recommended to continue current medication  Diabetes (A1c goal <7%) -Not ideally controlled -Current medications: . freestyle libre 2  . novolog 8 units before lunch and supper . Toujeo 35 units daily -Medications previously tried: trulicity  -Current home glucose readings . fasting glucose: 259 . post prandial glucose: 241, 146, 124 . Overnight blood sugar: Patient going low overnight 3-4 times some nights.  -Reports hypoglycemic/hyperglycemic symptoms -Current meal patterns:  Marland Kitchen Varies. Patient traveling lately and off her normal diet. She is having to correct blood sugar with boost and juice. She is fluctuating frequently.  -Current exercise: limited -Educated on A1c and blood sugar goals; Complications of diabetes including kidney damage, retinal damage, and cardiovascular disease; Prevention and management of hypoglycemic episodes; Continuous glucose monitoring; Carbohydrate counting  and/or plate method -Counseled to check feet daily and get yearly eye exams -Counseled on diet and exercise extensively Recommended change Novolog dose to 5 units if blood sugar <150 mg/dL or 10 units >150 mg/dL with lunch and supper.  Reduce Toujeo to 30 units daily.  Collaborated with [name] for approval on dose changes.    Patient Goals/Self-Care Activities . Patient will:  - take medications as prescribed focus on medication adherence by using pill box check glucose with [name] throughout the day, document, and provide at future appointments check blood pressure daily, document, and provide at future appointments  Follow Up Plan: Telephone follow up appointment with care management team member scheduled for:01/2021      Medication Assistance: None required.  Patient affirms current coverage meets needs.  Patient's preferred pharmacy is:  [id number] [address], [place] [address] [place] [address] Phone: [phone] Fax: [place], [place] [facility] [address] [place] [address] Phone: [phone] Fax: [phone]  Uses pill box? Yes Pt endorses 100% compliance  We discussed: Current pharmacy is preferred with insurance plan and patient is satisfied with pharmacy services Patient decided to: Continue current medication management strategy  Care Plan and Follow Up Patient Decision:  Patient agrees to Care Plan and Follow-up.  Plan: Telephone follow up appointment with care management team member scheduled for:  01/15/2021

## 2020-12-29 ENCOUNTER — Other Ambulatory Visit: Payer: Self-pay

## 2020-12-29 ENCOUNTER — Ambulatory Visit (INDEPENDENT_AMBULATORY_CARE_PROVIDER_SITE_OTHER): Payer: Medicare PPO

## 2020-12-29 DIAGNOSIS — N1832 Chronic kidney disease, stage 3b: Secondary | ICD-10-CM

## 2020-12-29 DIAGNOSIS — E1159 Type 2 diabetes mellitus with other circulatory complications: Secondary | ICD-10-CM

## 2020-12-29 DIAGNOSIS — E1122 Type 2 diabetes mellitus with diabetic chronic kidney disease: Secondary | ICD-10-CM | POA: Diagnosis not present

## 2020-12-29 DIAGNOSIS — Z794 Long term (current) use of insulin: Secondary | ICD-10-CM

## 2020-12-29 DIAGNOSIS — E1140 Type 2 diabetes mellitus with diabetic neuropathy, unspecified: Secondary | ICD-10-CM | POA: Diagnosis not present

## 2021-01-01 NOTE — Patient Instructions (Addendum)
Visit Information  Goals Addressed            This Visit's Progress   . Learn More About My Health       Timeframe:  Long-Range Goal Priority:  High Start Date:                             Expected End Date:                        Follow Up Date 01/2021   - tell my story and reason for my visit - make a list of questions - repeat what I heard to make sure I understand - bring a list of my medicines to the visit - speak up when I don't understand    Why is this important?    The best way to learn about your health and care is by talking to the doctor and nurse.   They will answer your questions and give you information in the way that you like best.    Notes:     Marland Kitchen Monitor and Manage My Blood Sugar-Diabetes Type 2       Timeframe:  Long-Range Goal Priority:  High Start Date:                             Expected End Date:                       Follow Up Date 01/2021    - check blood sugar if I feel it is too high or too low - enter blood sugar readings and medication or insulin into daily log - take the blood sugar log to all doctor visits    Why is this important?    Checking your blood sugar at home helps to keep it from getting very high or very low.   Writing the results in a diary or log helps the doctor know how to care for you.   Your blood sugar log should have the time, date and the results.   Also, write down the amount of insulin or other medicine that you take.   Other information, like what you ate, exercise done and how you were feeling, will also be helpful.     Notes:     . Track and Manage My Blood Pressure-Hypertension       Timeframe:  Long-Range Goal Priority:  High Start Date:                             Expected End Date:                       Follow Up Date 01/2021   - check blood pressure daily    Why is this important?    You won't feel high blood pressure, but it can still hurt your blood vessels.   High blood pressure can  cause heart or kidney problems. It can also cause a stroke.   Making lifestyle changes like losing a little weight or eating less salt will help.   Checking your blood pressure at home and at different times of the day can help to control blood pressure.   If the doctor prescribes medicine remember to take it the way the  doctor ordered.   Call the office if you cannot afford the medicine or if there are questions about it.     Notes:       Patient Care Plan: [facility] pharmacy care plan    Problem Identified: htn, hld, dm   Priority: High  Onset Date: 01/01/2021    Long-Range Goal: Disease State Management   Start Date: 01/01/2021  Expected End Date: 01/01/2022  This Visit's Progress: On track  Priority: High  Note:    Current Barriers:  . Unable to achieve control of blood sugar   Pharmacist Clinical Goal(s):  Marland Kitchen Patient will achieve control of diabetes as evidenced by a1c and blood sugar through collaboration with PharmD and provider.   Interventions: . 1:1 collaboration with Pamela Brome, MD regarding development and update of comprehensive plan of care as evidenced by provider attestation and co-signature . Inter-disciplinary care team collaboration (see longitudinal plan of care) . Comprehensive medication review performed; medication list updated in electronic medical record  Hypertension (BP goal <130/80) -Controlled -Current treatment: . carvedilol 25 mg bid . Lisinopril 40 mg bid  -Medications previously tried: not reported -Current home readings: none available right now -Current dietary habits: patient having to correct blood sugar with more juice and sugar lately. Trying boost currently.  -Current exercise habits: limited by pain -Denies hypotensive/hypertensive symptoms -Educated on BP goals and benefits of medications for prevention of heart attack, stroke and kidney damage; Daily salt intake goal < 2300 mg; Importance of home blood pressure  monitoring; -Counseled to monitor BP at home daily, document, and provide log at future appointments -Counseled on diet and exercise extensively Recommended to continue current medication  Hyperlipidemia: (LDL goal < 70) -Controlled -Current treatment: . atorvastatin 40 mg daily  -Medications previously tried: none reported  -Current dietary patterns: working on stabilizing blood sugar  -Current exercise habits: limited due to pain -Educated on Cholesterol goals;  Benefits of statin for ASCVD risk reduction; -Counseled on diet and exercise extensively Recommended to continue current medication  Diabetes (A1c goal <7%) -Not ideally controlled -Current medications: . freestyle libre 2  . novolog 8 units before lunch and supper . Toujeo 35 units daily -Medications previously tried: trulicity  -Current home glucose readings . fasting glucose: 259 . post prandial glucose: 241, 146, 124 . Overnight blood sugar: Patient going low overnight 3-4 times some nights.  -Reports hypoglycemic/hyperglycemic symptoms -Current meal patterns:  Marland Kitchen Varies. Patient traveling lately and off her normal diet. She is having to correct blood sugar with boost and juice. She is fluctuating frequently.  -Current exercise: limited -Educated on A1c and blood sugar goals; Complications of diabetes including kidney damage, retinal damage, and cardiovascular disease; Prevention and management of hypoglycemic episodes; Continuous glucose monitoring; Carbohydrate counting and/or plate method -Counseled to check feet daily and get yearly eye exams -Counseled on diet and exercise extensively Recommended change Novolog dose to 5 units if blood sugar <150 mg/dL or 10 units >150 mg/dL with lunch and supper. Reduce Toujeo to 30 units daily.  Collaborated with [name] for approval on dose changes.    Patient Goals/Self-Care Activities . Patient will:  - take medications as prescribed focus on medication adherence  by using pill box check glucose with [name] throughout the day, document, and provide at future appointments check blood pressure daily, document, and provide at future appointments  Follow Up Plan: Telephone follow up appointment with care management team member scheduled for:01/2021      Patient verbalizes understanding of instructions  provided today and agrees to view in [employer].  Telephone follow up appointment with pharmacy team member scheduled for: 01/2021  Burnice Logan, [facility]  [email or web address]  Carbohydrate Counting for Diabetes Mellitus, Adult Carbohydrate counting is a method of keeping track of how many carbohydrates you eat. Eating carbohydrates naturally increases the amount of sugar (glucose) in the blood. Counting how many carbohydrates you eat improves your blood glucose control, which helps you manage your diabetes. It is important to know how many carbohydrates you can safely have in each meal. This is different for every person. A dietitian can help you make a meal plan and calculate how many carbohydrates you should have at each meal and snack. What foods contain carbohydrates? Carbohydrates are found in the following foods:  Grains, such as breads and cereals.  Dried beans and soy products.  Starchy vegetables, such as potatoes, peas, and corn.  Fruit and fruit juices.  Milk and yogurt.  Sweets and snack foods, such as cake, cookies, candy, chips, and soft drinks.   How do I count carbohydrates in foods? There are two ways to count carbohydrates in food. You can read food labels or learn standard serving sizes of foods. You can use either of the methods or a combination of both. Using the Nutrition Facts label The Nutrition Facts list is included on the labels of almost all packaged foods and beverages in the [place] It includes:  The serving size.  Information  about nutrients in each serving, including the grams (g) of carbohydrate per serving. To use the Nutrition Facts:  Decide how many servings you will have.  Multiply the number of servings by the number of carbohydrates per serving.  The resulting number is the total amount of carbohydrates that you will be having. Learning the standard serving sizes of foods When you eat carbohydrate foods that are not packaged or do not include Nutrition Facts on the label, you need to measure the servings in order to count the amount of carbohydrates.  Measure the foods that you will eat with a food scale or measuring cup, if needed.  Decide how many standard-size servings you will eat.  Multiply the number of servings by 15. For foods that contain carbohydrates, one serving equals 15 g of carbohydrates. ? For example, if you eat 2 cups or 10 oz (300 g) of strawberries, you will have eaten 2 servings and 30 g of carbohydrates (2 servings x 15 g = 30 g).  For foods that have more than one food mixed, such as soups and casseroles, you must count the carbohydrates in each food that is included. The following list contains standard serving sizes of common carbohydrate-rich foods. Each of these servings has about 15 g of carbohydrates:  1 slice of bread.  1 six-inch (15 cm) tortilla.  ? cup or 2 oz (53 g) cooked rice or pasta.   cup or 3 oz (85 g) cooked or canned, drained and rinsed beans or lentils.   cup or 3 oz (85 g) starchy vegetable, such as peas, corn, or squash.   cup or 4 oz (120 g) hot cereal.   cup or 3 oz (85 g) boiled or mashed potatoes, or  or 3 oz (85 g) of a large baked potato.   cup or 4 fl oz (118 mL) fruit juice.  1 cup or 8 fl oz (237 mL) milk.  1 small or 4 oz (106 g) apple.   or 2 oz (63 g) of a  medium banana.  1 cup or 5 oz (150 g) strawberries.  3 cups or 1 oz (24 g) popped popcorn. What is an example of carbohydrate counting? To calculate the number of  carbohydrates in this sample meal, follow the steps shown below. Sample meal  3 oz (85 g) chicken breast.  ? cup or 4 oz (106 g) [place] rice.   cup or 3 oz (85 g) corn.  1 cup or 8 fl oz (237 mL) milk.  1 cup or 5 oz (150 g) strawberries with sugar-free whipped topping. Carbohydrate calculation 1. Identify the foods that contain carbohydrates: ? Rice. ? Corn. ? Milk. ? Strawberries. 2. Calculate how many servings you have of each food: ? 2 servings rice. ? 1 serving corn. ? 1 serving milk. ? 1 serving strawberries. 3. Multiply each number of servings by 15 g: ? 2 servings rice x 15 g = 30 g. ? 1 serving corn x 15 g = 15 g. ? 1 serving milk x 15 g = 15 g. ? 1 serving strawberries x 15 g = 15 g. 4. Add together all of the amounts to find the total grams of carbohydrates eaten: ? 30 g + 15 g + 15 g + 15 g = 75 g of carbohydrates total. What are tips for following this plan? Shopping  Develop a meal plan and then make a shopping list.  Buy fresh and frozen vegetables, fresh and frozen fruit, dairy, eggs, beans, lentils, and whole grains.  Look at food labels. Choose foods that have more fiber and less sugar.  Avoid processed foods and foods with added sugars. Meal planning  Aim to have the same amount of carbohydrates at each meal and for each snack time.  Plan to have regular, balanced meals and snacks. Where to find more information  American Diabetes Association: www.diabetes.org  Centers for Disease Control and Prevention: http://www.wolf.info/ Summary  Carbohydrate counting is a method of keeping track of how many carbohydrates you eat.  Eating carbohydrates naturally increases the amount of sugar (glucose) in the blood.  Counting how many carbohydrates you eat improves your blood glucose control, which helps you manage your diabetes.  A dietitian can help you make a meal plan and calculate how many carbohydrates you should have at each meal and snack. This  information is not intended to replace advice given to you by your health care provider. Make sure you discuss any questions you have with your health care provider. Document Revised: 08/22/2019 Document Reviewed: 08/23/2019 Elsevier Patient Education  2021 [employer].

## 2021-01-05 ENCOUNTER — Telehealth: Payer: Self-pay

## 2021-01-05 ENCOUNTER — Telehealth: Payer: Self-pay | Admitting: Hematology and Oncology

## 2021-01-05 DIAGNOSIS — E1122 Type 2 diabetes mellitus with diabetic chronic kidney disease: Secondary | ICD-10-CM | POA: Diagnosis not present

## 2021-01-05 NOTE — Telephone Encounter (Signed)
Telephoned patient regarding her INR of 2.2.  As it remains therapeutic, she will continue warfarin 3 mg Tuesday, Thursday and Saturday and 5 mg the rest of the days.  We will plan to repeat an INR at home in 2 weeks.

## 2021-01-05 NOTE — Telephone Encounter (Signed)
INR IS 2.2 THIS MORNING

## 2021-01-06 ENCOUNTER — Other Ambulatory Visit: Payer: Self-pay | Admitting: Cardiology

## 2021-01-11 ENCOUNTER — Telehealth: Payer: Self-pay | Admitting: Hematology and Oncology

## 2021-01-11 DIAGNOSIS — M47812 Spondylosis without myelopathy or radiculopathy, cervical region: Secondary | ICD-10-CM | POA: Diagnosis not present

## 2021-01-11 DIAGNOSIS — M25511 Pain in right shoulder: Secondary | ICD-10-CM | POA: Diagnosis not present

## 2021-01-11 DIAGNOSIS — M5030 Other cervical disc degeneration, unspecified cervical region: Secondary | ICD-10-CM | POA: Diagnosis not present

## 2021-01-11 NOTE — Progress Notes (Signed)
Chronic Care Management Pharmacy Note  01/12/2021 Name:  Pamela [name] MRN:  [id number] DOB:  March 31, 1946  Plan Updates:   Patient is now linked with clinic [facility] account. She is experiencing low blood sugar. Pharmacist consulted with Dr [name] about lower blood sugar readings. Patient informed to reduce [name] to 26 units and Humalog to 8 units with lunch and supper. Patient has tried and failed GLP-1 options and metformin. Patient is not interested in trying metformin again. Discussed referral to endocrinology. Patient has seen Dr. Loanne Drilling in the past and would like a different option in [place].   Patient is dealing with significant neck pain and waiting on insurance approval for an additional injection. She feels that pain is increasing her blood sugar numbers.   Subjective: Pamela [name] is an 75 y.o. year old female who is a primary patient of Cox, Kirsten, MD.  The CCM team was consulted for assistance with disease management and care coordination needs.    Engaged with patient by telephone for follow up visit in response to provider referral for pharmacy case management and/or care coordination services.   Consent to Services:  The patient was given information about Chronic Care Management services, agreed to services, and gave verbal consent prior to initiation of services.  Please see initial visit note for detailed documentation.   Patient Care Team: [name], MD as PCP - General (Family Medicine) Berniece Salines, DO as PCP - Cardiology (Cardiology) Marice Potter, MD as Consulting Physician (Oncology) Ishmael Holter, OD (Optometry) Ortho, [facility] (Specialist) Tanda Rockers, MD as Consulting Physician (Pulmonary Disease) Rozetta Nunnery, MD as Consulting Physician (Otolaryngology) Berniece Salines, DO as Consulting Physician (Cardiology) Renato Shin, MD as Consulting Physician (Endocrinology) Gavin Pound, MD as Consulting  Physician (Rheumatology) Armbruster, Carlota Raspberry, MD as Consulting Physician (Gastroenterology) Burnice Logan, [facility] as Pharmacist (Pharmacist)  Recent office visits: 12/09/2020 - Glucose 129, kidney tests improving, liver tests normal, potassium normal Recent consult visits:   Hospital visits: None in previous 6 months  Objective:  Lab Results  Component Value Date   CREATININE 1.01 (H) 12/09/2020   BUN 20 12/09/2020   GFR 57.98 (L) 03/10/2020   GFRNONAA 61 09/11/2020   GFRAA 70 09/11/2020   NA 135 12/09/2020   K 5.1 12/09/2020   CALCIUM 9.1 12/09/2020   CO2 23 12/09/2020   GLUCOSE 129 (H) 12/09/2020    Lab Results  Component Value Date/Time   HGBA1C 9.8 (H) 11/12/2020 10:32 AM   HGBA1C 8.6 (H) 08/13/2020 05:01 PM   GFR 57.98 (L) 03/10/2020 04:46 PM   GFR 61.14 02/05/2019 12:26 PM   MICROALBUR 10 07/29/2020 12:01 PM    Last diabetic Eye exam: No results found for: HMDIABEYEEXA  Last diabetic Foot exam: No results found for: HMDIABFOOTEX   Lab Results  Component Value Date   CHOL 135 11/12/2020   HDL 49 11/12/2020   LDLCALC 68 11/12/2020   TRIG 96 11/12/2020   CHOLHDL 2.8 11/12/2020    Hepatic Function Latest Ref Rng & Units 12/09/2020 11/12/2020 08/13/2020  Total Protein 6.0 - 8.5 g/dL 6.2 6.9 6.3  Albumin 3.7 - 4.7 g/dL 3.9 4.2 3.8  AST 0 - 40 IU/L _0 ALT 0 - 32 IU/L 22 65(H) 44(H)  Alk Phosphatase 44 - 121 IU/L 73 98 93  Total Bilirubin 0.0 - 1.2 mg/dL 0.4 0.6 0.4    Lab Results  Component Value Date/Time   TSH 2.020 11/14/2019  12:14 PM   TSH 1.24 02/05/2019 12:26 PM   FREET4 1.23 01/21/2013 12:47 PM    CBC Latest Ref Rng & Units 11/12/2020 09/11/2020 07/24/2020  WBC 3.4 - 10.8 x10E3/uL 8.7 6.5 5.8  Hemoglobin 11.1 - 15.9 g/dL 13.2 12.7 12.5  Hematocrit 34.0 - 46.6 % 40.4 37.6 37.9  Platelets 150 - 450 x10E3/uL 279 247 268    No results found for: VD25OH  Clinical ASCVD: Yes  The ASCVD Risk score Mikey Bussing DC Jr., et al., 2013) failed to calculate  for the following reasons:   The patient has a prior MI or stroke diagnosis    Depression screen [facility] 2/9 11/12/2020 06/07/2020  Decreased Interest 0 0  Down, Depressed, Hopeless 0 0  PHQ - 2 Score 0 0  Altered sleeping 0 -  Tired, decreased energy 0 -  Change in appetite 0 -  Feeling bad or failure about yourself  0 -  Trouble concentrating 0 -  Moving slowly or fidgety/restless 0 -  Suicidal thoughts 0 -  PHQ-9 Score 0 -  Difficult doing work/chores Not difficult at all -      Social History   Tobacco Use  Smoking Status Never Smoker  Smokeless Tobacco Never Used   BP Readings from Last 3 Encounters:  12/09/20 138/60  11/13/20 (!) 148/78  11/12/20 (!) 122/54   Pulse Readings from Last 3 Encounters:  12/09/20 80  11/13/20 (!) 57  11/12/20 (!) 57   Wt Readings from Last 3 Encounters:  12/09/20 174 lb 3.2 oz (79 kg)  11/12/20 166 lb (75.3 kg)  09/25/20 172 lb (78 kg)   BMI Readings from Last 3 Encounters:  12/09/20 29.90 kg/m  11/12/20 28.49 kg/m  09/25/20 33.59 kg/m    Assessment/Interventions: Review of patient past medical history, allergies, medications, health status, including review of consultants reports, laboratory and other test data, was performed as part of comprehensive evaluation and provision of chronic care management services.   SDOH:  (Social Determinants of Health) assessments and interventions performed: Yes  SDOH Screenings   Alcohol Screen: Not on file  Depression (PHQ2-9): Low Risk   . PHQ-2 Score: 0  Financial Resource Strain: Not on file  Food Insecurity: No Food Insecurity  . Worried About Charity fundraiser in the Last Year: Never true  . Ran Out of Food in the Last Year: Never true  Housing: Low Risk   . Last Housing Risk Score: 0  Physical Activity: Not on file  Social Connections: Not on file  Stress: Not on file  Tobacco Use: Low Risk   . Smoking Tobacco Use: Never Smoker  . Smokeless Tobacco Use: Never Used   Transportation Needs: No Transportation Needs  . Lack of Transportation (Medical): No  . Lack of Transportation (Non-Medical): No    CCM Care Plan  Allergies  Allergen Reactions  . Ketek [Telithromycin] Nausea And Vomiting and Rash  . Loxapine Succinate Hives  . Naproxen Sodium Anaphylaxis and Hives  . Amoxapine And Related Hives  . Darvon Nausea And Vomiting  . Belsomra [Suvorexant]     "Nightmares"  . Cymbalta [Duloxetine Hcl]     "Blackouts"  . Gabapentin Other (See Comments)    Blurry vision  . Amoxicillin Rash  . Propoxyphene Nausea And Vomiting    Medications Reviewed Today    Reviewed by Burnice Logan, [facility] (Pharmacist) on 01/12/21 at 1544  Med List Status: <None>  Medication Order Taking? Sig Documenting Provider Last Dose Status Informant  acetaminophen (TYLENOL) 500 MG tablet [id number] Yes Take 500-1,000 mg by mouth every 6 (six) hours as needed (for pain.). [provider] Taking Active Self  albuterol (VENTOLIN HFA) 108 (90 Base) MCG/ACT inhaler [id number] Yes Inhale 1-2 puffs into the lungs every 6 (six) hours as needed for wheezing or shortness of breath. [provider] Taking Active   aspirin EC 81 MG tablet [id number] Yes Take 1 tablet (81 mg total) by mouth daily. Tobb, Kardie, DO Taking Active   atorvastatin (LIPITOR) 40 MG tablet [id number] Yes Take 1 tablet (40 mg total) by mouth daily at 12 noon. Tobb, Kardie, DO Taking Active   azelastine (ASTELIN) 137 MCG/SPRAY nasal spray [id number] Yes Place 2 sprays into the nose daily as needed for rhinitis or allergies. [provider] Taking Active   B Complex-C (B-COMPLEX WITH VITAMIN C) tablet [id number] Yes Take 1 tablet by mouth daily with lunch. [provider] Taking Active Self  carvedilol (COREG) 25 MG tablet [id number] No TAKE 1 TABLET(25 MG) BY MOUTH TWICE DAILY WITH A MEAL  Patient not taking: Reported on 01/12/2021   CoxElnita Maxwell, MD Not Taking Active   Cholecalciferol  (VITAMIN D3) 50 MCG (2000 UT) TABS [id number] Yes Take 2,000 Units by mouth daily with lunch.  [provider] Taking Active Self  Continuous Blood Gluc Receiver (FREESTYLE LIBRE 2 READER) [place] [id number] Yes 1 each by Does not apply route 4 (four) times daily - after meals and at bedtime. E11.40 CoxElnita Maxwell, MD Taking Active   Continuous Blood Gluc Sensor (FREESTYLE LIBRE 2 SENSOR) [place] [id number] Yes 2 each by Does not apply route every 14 (fourteen) days. E11.40 Cox, Elnita Maxwell, MD Taking Active   denosumab (PROLIA) 60 MG/ML SOSY injection [id number] Yes Inject 60 mg into the skin every 6 (six) months. [provider] Taking Active Self           Med Note Danne Baxter, Everardo All Mar 05, 2020  9:48 AM)    estradiol (ESTRACE) 0.1 MG/GM vaginal cream [id number] Yes Uses twice weekly [provider] Taking Active   ferrous sulfate 325 (65 FE) MG tablet [id number] Yes Take 325 mg by mouth daily. [provider] Taking Active Self  furosemide (LASIX) 20 MG tablet [id number] Yes Take 1 tablet (20 mg total) by mouth daily. Tobb, Kardie, DO Taking Expired 11/12/20 2359   insulin aspart (NOVOLOG FLEXPEN) 100 UNIT/ML FlexPen [id number] Yes 10 U before largest meal  Patient taking differently: Inject 8 Units into the skin. 4 units before lunch lunch and supper   Cox, Elnita Maxwell, MD Taking Active            Med Note Owens Shark, [place]   Fri Jan 01, 2021  7:23 PM) Patient taking 8 units twice daily before lunch and supper.   insulin glargine, 1 Unit Dial, (TOUJEO) 300 UNIT/ML Solostar Pen [id number] Yes Inject 50 Units into the skin daily.  Patient taking differently: Inject 35 Units into the skin daily. Reduce to 35 units   Cox, Kirsten, MD Taking Active            Med Note [place], [place] Jan 12, 2021  3:41 PM) Reduce dose Toujeo 26 units daily  Insulin Pen Needle (BD PEN NEEDLE NANO 2ND GEN) 32G X 4 MM MISC [id number] Yes USE DAILY AS DIRECTED Cox, Kirsten, MD Taking Active    ipratropium-albuterol (DUONEB) 0.5-2.5 (3) MG/3ML SOLN [id number] Yes Take 3 mLs by nebulization 4 (four) times daily  as needed (wheezing/shortness of breath). [provider] Taking Active   lisinopril (ZESTRIL) 20 MG tablet [id number] Yes Take 2 tablets (40 mg total) by mouth 2 (two) times daily. Cox, Kirsten, MD Taking Active   Magnesium Oxide 400 (240 Mg) MG TABS [id number] Yes TAKE 2 TABLETS(800 MG) BY MOUTH TWICE DAILY Cox, Kirsten, MD Taking Active   Menthol-Methyl Salicylate [facility] PAIN RELIEF PATCH) PTCH [id number] Yes Apply 1 patch topically daily as needed (pain.). [provider] Taking Active Self  methocarbamol (ROBAXIN) 500 MG tablet [id number] Yes Take 500 mg by mouth every 8 (eight) hours as needed for muscle spasms. [provider] Taking Active   ondansetron (ZOFRAN-ODT) 8 MG disintegrating tablet [id number] Yes Take 8 mg by mouth every 8 (eight) hours as needed for nausea or vomiting.  [provider] Taking Active Self  oxyCODONE (OXY IR/ROXICODONE) 5 MG immediate release tablet [id number] Yes Take 1 tablet (5 mg total) by mouth every 6 (six) hours as needed for severe pain. Nicholes Stairs, MD Taking Active   pantoprazole (PROTONIX) 40 MG tablet [id number] Yes Take 1 tablet (40 mg total) by mouth every morning. Cox, Kirsten, MD Taking Active   polyethylene glycol (MIRALAX / GLYCOLAX) 17 g packet [id number] Yes Take 17 g by mouth as needed. Uses 1-2 times weekly [provider] Taking Active   pregabalin (LYRICA) 25 MG capsule [id number] Yes Take 1 capsule (25 mg total) by mouth 3 (three) times daily. Cox, Kirsten, MD Taking Active            Med Note [place], [place] Jan 12, 2021  3:42 PM) 75 mg at night per patient   senna-docusate (SENOKOT-S) 8.6-50 MG tablet [id number] Yes Take 1 tablet by mouth at bedtime. [provider] Taking Active Self  sodium chloride (OCEAN) 0.65 % SOLN nasal spray [id number] Yes Place 1-2 sprays  into both nostrils 4 (four) times daily as needed for congestion. [provider] Taking Active Self  sulfamethoxazole-trimethoprim (BACTRIM DS) 800-160 MG tablet [id number] No Take 1 tablet by mouth 2 (two) times daily.  Patient not taking: No sig reported   [provider] Not Taking Active   tolterodine (DETROL LA) 2 MG 24 hr capsule [id number] No Take 2 mg by mouth at bedtime.   Patient not taking: No sig reported   [provider] Not Taking Active   Vibegron (GEMTESA PO) [id number] Yes Take by mouth at bedtime. [provider] Taking Active   warfarin (COUMADIN) 1 MG tablet [id number] Yes Take 1 tablet (1 mg total) by mouth See admin instructions. Take as directed  Patient taking differently: Take 3 mg by mouth See admin instructions. Mon-Wed-Fri   Mosher, Vida Roller A, PA-C Taking Active   warfarin (COUMADIN) 5 MG tablet [id number] Yes TAKE 1 TABLET BY MOUTH EVERY DAY Mosher, Thalia Bloodgood, PA-C Taking Active           Patient Active Problem List   Diagnosis Date Noted  . Type 2 diabetes mellitus with vascular disease ([place]) 12/09/2020  . Chronic diastolic congestive heart failure ([place]) 12/09/2020  . Type 2 diabetes mellitus with stage 3 chronic kidney disease ([place]) 12/09/2020  . PVC's (premature ventricular contractions)   . TIA (transient ischemic attack)   . Neck pain 06/11/2020  . Anemia   . Asthma   . Bilateral leg cramps   . Chronic anticoagulation   . Chronic constipation   . Complication of anesthesia   . DJD (degenerative joint disease)   .  Dyspnea   . GERD (gastroesophageal reflux disease)   . Heart murmur   . HTN (hypertension)   . Hypercholesteremia   . Obesity   . Pneumonia   . PONV (postoperative nausea and vomiting)   . Sleep apnea   . Stroke ([place])   . Encounter for orthopedic follow-up care 03/10/2020  . Closed bimalleolar fracture of left ankle 02/26/2020  . Hyponatremia 02/06/2019  . Solitary pulmonary nodule on lung CT  02/06/2019  . Spondylolisthesis, lumbar region 08/09/2018  . Low back strain 08/09/2018  . Osteoarthritis of right knee 07/06/2018  . Low back pain 07/06/2018  . Pain in right knee 07/06/2018  . Atrial fibrillation [I48.91] 10/01/2014  . DVT (deep venous thrombosis) ([place]) 07/30/2013  . Protein S deficiency ([place]) 03/03/2013  . Long term (current) use of anticoagulants 03/03/2013  . Dyspnea on exertion 03/03/2013  . DOE (dyspnea on exertion) 03/03/2013  . S/P cardiac catheterization 02/2013  . Ventricular premature beats 05/23/2011  . Dizziness 05/23/2011    Immunization History  Administered Date(s) Administered  . Fluad Quad(high Dose 65+) 05/26/2020  . Influenza,inj,Quad PF,6+ Mos 06/04/2013  . PFIZER(Purple Top)SARS-COV-2 Vaccination 09/22/2019, 10/12/2019, 06/16/2020  . Pneumococcal Conjugate-13 07/30/2014  . Pneumococcal Polysaccharide-23 03/31/2008  . Tdap 07/25/2011  . Zoster 03/31/2008, 06/03/2011    Conditions to be addressed/monitored:  Hypertension, Hyperlipidemia and Diabetes  Care Plan : ccm pharmacy care plan  Updates made by Burnice Logan, [place] since 01/12/2021 12:00 AM    Problem: htn, hld, dm   Priority: High  Onset Date: 01/01/2021    Long-Range Goal: Disease State Management   Start Date: 01/01/2021  Expected End Date: 01/01/2022  This Visit's Progress: On track  Recent Progress: On track  Priority: High  Note:    Current Barriers:  . Unable to achieve control of blood sugar   Pharmacist Clinical Goal(s):  Marland Kitchen Patient will achieve control of diabetes as evidenced by a1c and blood sugar through collaboration with PharmD and provider.   Interventions: . 1:1 collaboration with [name], MD regarding development and update of comprehensive plan of care as evidenced by provider attestation and co-signature . Inter-disciplinary care team collaboration (see longitudinal plan of care) . Comprehensive medication review performed; medication list updated  in electronic medical record  Hypertension (BP goal <130/80) -Controlled -Current treatment: . Lisinopril 40 mg bid  -Medications previously tried: not reported -Current home readings: none available right now -Current dietary habits: patient having to correct blood sugar with more juice and sugar lately. Trying boost currently.  -Current exercise habits: limited by pain -Denies hypotensive/hypertensive symptoms -Educated on BP goals and benefits of medications for prevention of heart attack, stroke and kidney damage; Daily salt intake goal < 2300 mg; Importance of home blood pressure monitoring; -Counseled to monitor BP at home daily, document, and provide log at future appointments -Counseled on diet and exercise extensively Recommended to continue current medication  Hyperlipidemia: (LDL goal < 70) -Controlled -Current treatment: . atorvastatin 40 mg daily  -Medications previously tried: none reported  -Current dietary patterns: working on stabilizing blood sugar  -Current exercise habits: limited due to pain -Educated on Cholesterol goals;  Benefits of statin for ASCVD risk reduction; -Counseled on diet and exercise extensively Recommended to continue current medication  Diabetes (A1c goal <7%) -Not ideally controlled -Current medications: . freestyle libre 2  . novolog 8 units before lunch and supper . Toujeo 26 units daily -Medications previously tried: trulicity, metformin, actoplus met  -Current home glucose readings .  fasting glucose: 100-180 with occasion readings in the 60s . post prandial glucose: 200-300 . Overnight blood sugar: Patient going low overnight and early am times some nights.  -Reports hypoglycemic/hyperglycemic symptoms -Current meal patterns:  Marland Kitchen Varies. Patient traveling lately and off her normal diet. She is having to correct blood sugar with boost and juice. She is fluctuating frequently.  -Current exercise: limited -Educated on A1c and blood  sugar goals; Complications of diabetes including kidney damage, retinal damage, and cardiovascular disease; Prevention and management of hypoglycemic episodes; Continuous glucose monitoring; Carbohydrate counting and/or plate method -Counseled to check feet daily and get yearly eye exams -Counseled on diet and exercise extensively Recommended change Novolog dose to 4 units if blood sugar <150 mg/dL or 8 units >150 mg/dL with lunch and supper. Reduce Toujeo to 26 units daily.  Collaborated with [name] for approval on dose changes. Coordinating Endocrinology referral with [name].     Patient Goals/Self-Care Activities . Patient will:  - take medications as prescribed focus on medication adherence by using pill box check glucose with [name] throughout the day, document, and provide at future appointments check blood pressure daily, document, and provide at future appointments  Follow Up Plan: Telephone follow up appointment with care management team member scheduled for:04/2021      Medication Assistance: None required.  Patient affirms current coverage meets needs.  Patient's preferred pharmacy is:  [id number] [address], [place] AT [address] [place] [address] Phone: [phone] Fax: [place], [place] [facility] [address] [place] [address] Phone: [phone] Fax: [phone]  Uses pill box? Yes Pt endorses 100% compliance  We discussed: Current pharmacy is preferred with insurance plan and patient is satisfied with pharmacy services Patient decided to: Continue current medication management strategy  Care Plan and Follow Up Patient Decision:  Patient agrees to Care Plan and Follow-up.  Plan: Telephone follow up appointment with care management team member scheduled for:  01/15/2021

## 2021-01-11 NOTE — Telephone Encounter (Signed)
Error

## 2021-01-12 ENCOUNTER — Other Ambulatory Visit: Payer: Self-pay

## 2021-01-12 ENCOUNTER — Ambulatory Visit (INDEPENDENT_AMBULATORY_CARE_PROVIDER_SITE_OTHER): Payer: Medicare PPO

## 2021-01-12 DIAGNOSIS — N1832 Chronic kidney disease, stage 3b: Secondary | ICD-10-CM | POA: Diagnosis not present

## 2021-01-12 DIAGNOSIS — E1159 Type 2 diabetes mellitus with other circulatory complications: Secondary | ICD-10-CM | POA: Diagnosis not present

## 2021-01-12 DIAGNOSIS — E1122 Type 2 diabetes mellitus with diabetic chronic kidney disease: Secondary | ICD-10-CM | POA: Diagnosis not present

## 2021-01-12 DIAGNOSIS — Z794 Long term (current) use of insulin: Secondary | ICD-10-CM

## 2021-01-12 DIAGNOSIS — E1140 Type 2 diabetes mellitus with diabetic neuropathy, unspecified: Secondary | ICD-10-CM | POA: Diagnosis not present

## 2021-01-12 DIAGNOSIS — I10 Essential (primary) hypertension: Secondary | ICD-10-CM | POA: Diagnosis not present

## 2021-01-12 NOTE — Patient Instructions (Addendum)
Visit Information  Goals Addressed   None    Patient Care Plan: [facility] pharmacy care plan    Problem Identified: htn, hld, dm   Priority: High  Onset Date: 01/01/2021    Long-Range Goal: Disease State Management   Start Date: 01/01/2021  Expected End Date: 01/01/2022  This Visit's Progress: On track  Recent Progress: On track  Priority: High  Note:    Current Barriers:  . Unable to achieve control of blood sugar   Pharmacist Clinical Goal(s):  Marland Kitchen Patient will achieve control of diabetes as evidenced by a1c and blood sugar through collaboration with PharmD and provider.   Interventions: . 1:1 collaboration with Pamela Brome, MD regarding development and update of comprehensive plan of care as evidenced by provider attestation and co-signature . Inter-disciplinary care team collaboration (see longitudinal plan of care) . Comprehensive medication review performed; medication list updated in electronic medical record  Hypertension (BP goal <130/80) -Controlled -Current treatment: . Lisinopril 40 mg bid  -Medications previously tried: not reported -Current home readings: none available right now -Current dietary habits: patient having to correct blood sugar with more juice and sugar lately. Trying boost currently.  -Current exercise habits: limited by pain -Denies hypotensive/hypertensive symptoms -Educated on BP goals and benefits of medications for prevention of heart attack, stroke and kidney damage; Daily salt intake goal < 2300 mg; Importance of home blood pressure monitoring; -Counseled to monitor BP at home daily, document, and provide log at future appointments -Counseled on diet and exercise extensively Recommended to continue current medication  Hyperlipidemia: (LDL goal < 70) -Controlled -Current treatment: . atorvastatin 40 mg daily  -Medications previously tried: none reported  -Current dietary patterns: working on stabilizing blood sugar  -Current exercise  habits: limited due to pain -Educated on Cholesterol goals;  Benefits of statin for ASCVD risk reduction; -Counseled on diet and exercise extensively Recommended to continue current medication  Diabetes (A1c goal <7%) -Not ideally controlled -Current medications: . freestyle libre 2  . novolog 8 units before lunch and supper . Toujeo 26 units daily -Medications previously tried: trulicity, metformin, actoplus met  -Current home glucose readings . fasting glucose: 100-180 with occasion readings in the 60s . post prandial glucose: 200-300 . Overnight blood sugar: Patient going low overnight and early am times some nights.  -Reports hypoglycemic/hyperglycemic symptoms -Current meal patterns:  Marland Kitchen Varies. Patient traveling lately and off her normal diet. She is having to correct blood sugar with boost and juice. She is fluctuating frequently.  -Current exercise: limited -Educated on A1c and blood sugar goals; Complications of diabetes including kidney damage, retinal damage, and cardiovascular disease; Prevention and management of hypoglycemic episodes; Continuous glucose monitoring; Carbohydrate counting and/or plate method -Counseled to check feet daily and get yearly eye exams -Counseled on diet and exercise extensively Recommended change Novolog dose to 4 units if blood sugar <150 mg/dL or 8 units >150 mg/dL with lunch and supper. Reduce Toujeo to 26 units daily.  Collaborated with Pamela [name] for approval on dose changes. Coordinating Endocrinology referral with Pamela [name].     Patient Goals/Self-Care Activities . Patient will:  - take medications as prescribed focus on medication adherence by using pill box check glucose with Pamela [name] throughout the day, document, and provide at future appointments check blood pressure daily, document, and provide at future appointments  Follow Up Plan: Telephone follow up appointment with care management team member scheduled for:04/2021      The  patient verbalized understanding of instructions, educational materials, and  care plan provided today and declined offer to receive copy of patient instructions, educational materials, and care plan.  Telephone follow up appointment with pharmacy team member scheduled for: 04/2021  Pamela [name], [facility]  Diabetes Mellitus and Nutrition, Adult When you have diabetes, or diabetes mellitus, it is very important to have healthy eating habits because your blood sugar (glucose) levels are greatly affected by what you eat and drink. Eating healthy foods in the right amounts, at about the same times every day, can help you:  Control your blood glucose.  Lower your risk of heart disease.  Improve your blood pressure.  Reach or maintain a healthy weight. What can affect my meal plan? Every person with diabetes is different, and each person has different needs for a meal plan. Your health care provider may recommend that you work with a dietitian to make a meal plan that is best for you. Your meal plan may vary depending on factors such as:  The calories you need.  The medicines you take.  Your weight.  Your blood glucose, blood pressure, and cholesterol levels.  Your activity level.  Other health conditions you have, such as heart or kidney disease. How do carbohydrates affect me? Carbohydrates, also called carbs, affect your blood glucose level more than any other type of food. Eating carbs naturally raises the amount of glucose in your blood. Carb counting is a method for keeping track of how many carbs you eat. Counting carbs is important to keep your blood glucose at a healthy level, especially if you use insulin or take certain oral diabetes medicines. It is important to know how many carbs you can safely have in each meal. This is different for every person. Your dietitian can help you calculate how many carbs you should have at each meal and for each snack. How does alcohol affect  me? Alcohol can cause a sudden decrease in blood glucose (hypoglycemia), especially if you use insulin or take certain oral diabetes medicines. Hypoglycemia can be a life-threatening condition. Symptoms of hypoglycemia, such as sleepiness, dizziness, and confusion, are similar to symptoms of having too much alcohol.  Do not drink alcohol if: ? Your health care provider tells you not to drink. ? You are pregnant, may be pregnant, or are planning to become pregnant.  If you drink alcohol: ? Do not drink on an empty stomach. ? Limit how much you use to:  0-1 drink a day for women.  0-2 drinks a day for men. ? Be aware of how much alcohol is in your drink. In the [place] one drink equals one 12 oz bottle of beer (355 mL), one 5 oz glass of wine (148 mL), or one 1 oz glass of hard liquor (44 mL). ? Keep yourself hydrated with water, diet soda, or unsweetened iced tea.  Keep in mind that regular soda, juice, and other mixers may contain a lot of sugar and must be counted as carbs. What are tips for following this plan? Reading food labels  Start by checking the serving size on the "Nutrition Facts" label of packaged foods and drinks. The amount of calories, carbs, fats, and other nutrients listed on the label is based on one serving of the item. Many items contain more than one serving per package.  Check the total grams (g) of carbs in one serving. You can calculate the number of servings of carbs in one serving by dividing the total carbs by 15. For example, if a food has  30 g of total carbs per serving, it would be equal to 2 servings of carbs.  Check the number of grams (g) of saturated fats and trans fats in one serving. Choose foods that have a low amount or none of these fats.  Check the number of milligrams (mg) of salt (sodium) in one serving. Most people should limit total sodium intake to less than 2,300 mg per day.  Always check the nutrition information of foods labeled as  "low-fat" or "nonfat." These foods may be higher in added sugar or refined carbs and should be avoided.  Talk to your dietitian to identify your daily goals for nutrients listed on the label. Shopping  Avoid buying canned, pre-made, or processed foods. These foods tend to be high in fat, sodium, and added sugar.  Shop around the outside edge of the grocery store. This is where you will most often find fresh fruits and vegetables, bulk grains, fresh meats, and fresh dairy. Cooking  Use low-heat cooking methods, such as baking, instead of high-heat cooking methods like deep frying.  Cook using healthy oils, such as olive, canola, or sunflower oil.  Avoid cooking with butter, cream, or high-fat meats. Meal planning  Eat meals and snacks regularly, preferably at the same times every day. Avoid going long periods of time without eating.  Eat foods that are high in fiber, such as fresh fruits, vegetables, beans, and whole grains. Talk with your dietitian about how many servings of carbs you can eat at each meal.  Eat 4-6 oz (112-168 g) of lean protein each day, such as lean meat, chicken, fish, eggs, or tofu. One ounce (oz) of lean protein is equal to: ? 1 oz (28 g) of meat, chicken, or fish. ? 1 egg. ?  cup (62 g) of tofu.  Eat some foods each day that contain healthy fats, such as avocado, nuts, seeds, and fish.   What foods should I eat? Fruits Berries. Apples. Oranges. Peaches. Apricots. Plums. Grapes. Mango. Papaya. Pomegranate. Kiwi. Cherries. Vegetables Lettuce. Spinach. Leafy greens, including kale, chard, collard greens, and mustard greens. Beets. Cauliflower. Cabbage. Broccoli. Carrots. Green beans. Tomatoes. Peppers. Onions. Cucumbers. Brussels sprouts. Grains Whole grains, such as whole-wheat or whole-grain bread, crackers, tortillas, cereal, and pasta. Unsweetened oatmeal. Quinoa. Pamela [name] or wild rice. Meats and other proteins Seafood. Poultry without skin. Lean cuts of  poultry and beef. Tofu. Nuts. Seeds. Dairy Low-fat or fat-free dairy products such as milk, yogurt, and cheese. The items listed above may not be a complete list of foods and beverages you can eat. Contact a dietitian for more information. What foods should I avoid? Fruits Fruits canned with syrup. Vegetables Canned vegetables. Frozen vegetables with butter or cream sauce. Grains Refined white flour and flour products such as bread, pasta, snack foods, and cereals. Avoid all processed foods. Meats and other proteins Fatty cuts of meat. Poultry with skin. Breaded or fried meats. Processed meat. Avoid saturated fats. Dairy Full-fat yogurt, cheese, or milk. Beverages Sweetened drinks, such as soda or iced tea. The items listed above may not be a complete list of foods and beverages you should avoid. Contact a dietitian for more information. Questions to ask a health care provider  Do I need to meet with a diabetes educator?  Do I need to meet with a dietitian?  What number can I call if I have questions?  When are the best times to check my blood glucose? Where to find more information:  American Diabetes Association: diabetes.org  Academy of Nutrition and Dietetics: www.eatright.[employer] of Diabetes and Digestive and Kidney Diseases: [employer]  Association of Diabetes Care and Education Specialists: www.diabeteseducator.org Summary  It is important to have healthy eating habits because your blood sugar (glucose) levels are greatly affected by what you eat and drink.  A healthy meal plan will help you control your blood glucose and maintain a healthy lifestyle.  Your health care provider may recommend that you work with a dietitian to make a meal plan that is best for you.  Keep in mind that carbohydrates (carbs) and alcohol have immediate effects on your blood glucose levels. It is important to count carbs and to use alcohol carefully. This  information is not intended to replace advice given to you by your health care provider. Make sure you discuss any questions you have with your health care provider. Document Revised: 07/30/2019 Document Reviewed: 07/30/2019 Elsevier Patient Education  2021 [employer].

## 2021-01-15 ENCOUNTER — Telehealth: Payer: Medicare PPO

## 2021-01-17 ENCOUNTER — Other Ambulatory Visit: Payer: Self-pay | Admitting: Family Medicine

## 2021-01-17 DIAGNOSIS — E1140 Type 2 diabetes mellitus with diabetic neuropathy, unspecified: Secondary | ICD-10-CM

## 2021-01-19 ENCOUNTER — Telehealth: Payer: Self-pay

## 2021-01-19 NOTE — Progress Notes (Signed)
Chronic Care Management Pharmacy Assistant   Name: Pamela [name]  MRN: [id number] DOB: Jan 03, 1946  Reason for Encounter: Medication Review for Libre system    Medications: Outpatient Encounter Medications as of 01/19/2021  Medication Sig Note  . acetaminophen (TYLENOL) 500 MG tablet Take 500-1,000 mg by mouth every 6 (six) hours as needed (for pain.).   Marland Kitchen albuterol (VENTOLIN HFA) 108 (90 Base) MCG/ACT inhaler Inhale 1-2 puffs into the lungs every 6 (six) hours as needed for wheezing or shortness of breath.   Marland Kitchen aspirin EC 81 MG tablet Take 1 tablet (81 mg total) by mouth daily.   Marland Kitchen atorvastatin (LIPITOR) 40 MG tablet Take 1 tablet (40 mg total) by mouth daily at 12 noon.   Marland Kitchen azelastine (ASTELIN) 137 MCG/SPRAY nasal spray Place 2 sprays into the nose daily as needed for rhinitis or allergies.   . B Complex-C (B-COMPLEX WITH VITAMIN C) tablet Take 1 tablet by mouth daily with lunch.   . carvedilol (COREG) 25 MG tablet TAKE 1 TABLET(25 MG) BY MOUTH TWICE DAILY WITH A MEAL (Patient not taking: Reported on 01/12/2021)   . Cholecalciferol (VITAMIN D3) 50 MCG (2000 UT) TABS Take 2,000 Units by mouth daily with lunch.    . Continuous Blood Gluc Receiver (FREESTYLE LIBRE 2 READER) DEVI 1 each by Does not apply route 4 (four) times daily - after meals and at bedtime. E11.40   . Continuous Blood Gluc Sensor (FREESTYLE LIBRE 2 SENSOR) MISC 2 each by Does not apply route every 14 (fourteen) days. E11.40   . denosumab (PROLIA) 60 MG/ML SOSY injection Inject 60 mg into the skin every 6 (six) months.   Marland Kitchen estradiol (ESTRACE) 0.1 MG/GM vaginal cream Uses twice weekly   . ferrous sulfate 325 (65 FE) MG tablet Take 325 mg by mouth daily.   . furosemide (LASIX) 20 MG tablet Take 1 tablet (20 mg total) by mouth daily.   . insulin aspart (NOVOLOG FLEXPEN) 100 UNIT/ML FlexPen 10 U before largest meal (Patient taking differently: Inject 8 Units into the skin. 4 units before lunch lunch and supper)  01/01/2021: Patient taking 8 units twice daily before lunch and supper.   . insulin glargine, 1 Unit Dial, (TOUJEO) 300 UNIT/ML Solostar Pen Inject 50 Units into the skin daily. (Patient taking differently: Inject 35 Units into the skin daily. Reduce to 35 units) 01/12/2021: Reduce dose Toujeo 26 units daily  . Insulin Pen Needle (BD PEN NEEDLE NANO 2ND GEN) 32G X 4 MM MISC USE DAILY AS DIRECTED   . ipratropium-albuterol (DUONEB) 0.5-2.5 (3) MG/3ML SOLN Take 3 mLs by nebulization 4 (four) times daily as needed (wheezing/shortness of breath).   Marland Kitchen lisinopril (ZESTRIL) 20 MG tablet Take 2 tablets (40 mg total) by mouth 2 (two) times daily.   . Magnesium Oxide 400 (240 Mg) MG TABS TAKE 2 TABLETS(800 MG) BY MOUTH TWICE DAILY   . Menthol-Methyl Salicylate (SALONPAS PAIN RELIEF PATCH) PTCH Apply 1 patch topically daily as needed (pain.).   Marland Kitchen methocarbamol (ROBAXIN) 500 MG tablet Take 500 mg by mouth every 8 (eight) hours as needed for muscle spasms.   . ondansetron (ZOFRAN-ODT) 8 MG disintegrating tablet Take 8 mg by mouth every 8 (eight) hours as needed for nausea or vomiting.    Marland Kitchen oxyCODONE (OXY IR/ROXICODONE) 5 MG immediate release tablet Take 1 tablet (5 mg total) by mouth every 6 (six) hours as needed for severe pain.   . pantoprazole (PROTONIX) 40 MG tablet Take 1 tablet (  40 mg total) by mouth every morning.   . polyethylene glycol (MIRALAX / GLYCOLAX) 17 g packet Take 17 g by mouth as needed. Uses 1-2 times weekly   . pregabalin (LYRICA) 25 MG capsule Take 1 capsule (25 mg total) by mouth 3 (three) times daily. 01/12/2021: 75 mg at night per patient   . senna-docusate (SENOKOT-S) 8.6-50 MG tablet Take 1 tablet by mouth at bedtime.   . sodium chloride (OCEAN) 0.65 % SOLN nasal spray Place 1-2 sprays into both nostrils 4 (four) times daily as needed for congestion.   . sulfamethoxazole-trimethoprim (BACTRIM DS) 800-160 MG tablet Take 1 tablet by mouth 2 (two) times daily. (Patient not taking: No sig  reported)   . tolterodine (DETROL LA) 2 MG 24 hr capsule Take 2 mg by mouth at bedtime.  (Patient not taking: No sig reported)   . Vibegron (GEMTESA PO) Take by mouth at bedtime.   Marland Kitchen warfarin (COUMADIN) 1 MG tablet Take 1 tablet (1 mg total) by mouth See admin instructions. Take as directed (Patient taking differently: Take 3 mg by mouth See admin instructions. Mon-Wed-Fri)   . warfarin (COUMADIN) 5 MG tablet TAKE 1 TABLET BY MOUTH EVERY DAY    No facility-administered encounter medications on file as of 01/19/2021.    Patient called stated she was going to have to get some xrays next week, she wondered if she could wear her sensor.  I got online to see what I could find, several sites noted not to expose the sensor to Xray.  The patient asked if she could wear it, remove it, and then place it back again.  I was not getting clear answers from my search.  I advised the patient to call tech support that came with her system, they should be able to advise her of the best solution.  She stated she would call them and ask.  Clarita Leber, [place] Pharmacist Assistant [phone]

## 2021-01-20 ENCOUNTER — Telehealth: Payer: Self-pay

## 2021-01-20 NOTE — Telephone Encounter (Addendum)
  Pt notified of below. She read back & verified the correct Coumadin dosing.  Message  Is it 1.8? Please have her change warfarin to 3mg  on Sat and Tues only, 5mg  the rest of days, so changing Thurs dose to 5mg . Repeat in 1 week. Thanks!  ----- Message -----  From: Dairl Ponder, RN  Sent: 01/20/2021  2:46 PM EDT  To: Marvia Pickles, PA-C

## 2021-01-22 ENCOUNTER — Ambulatory Visit (INDEPENDENT_AMBULATORY_CARE_PROVIDER_SITE_OTHER): Payer: Medicare PPO | Admitting: Nurse Practitioner

## 2021-01-22 ENCOUNTER — Other Ambulatory Visit: Payer: Self-pay

## 2021-01-22 ENCOUNTER — Encounter: Payer: Self-pay | Admitting: Nurse Practitioner

## 2021-01-22 VITALS — BP 134/70 | HR 61 | Ht 64.0 in | Wt 185.0 lb

## 2021-01-22 DIAGNOSIS — E1140 Type 2 diabetes mellitus with diabetic neuropathy, unspecified: Secondary | ICD-10-CM | POA: Diagnosis not present

## 2021-01-22 DIAGNOSIS — E1122 Type 2 diabetes mellitus with diabetic chronic kidney disease: Secondary | ICD-10-CM | POA: Diagnosis not present

## 2021-01-22 DIAGNOSIS — N1832 Chronic kidney disease, stage 3b: Secondary | ICD-10-CM | POA: Diagnosis not present

## 2021-01-22 DIAGNOSIS — E782 Mixed hyperlipidemia: Secondary | ICD-10-CM

## 2021-01-22 DIAGNOSIS — I10 Essential (primary) hypertension: Secondary | ICD-10-CM

## 2021-01-22 DIAGNOSIS — Z794 Long term (current) use of insulin: Secondary | ICD-10-CM

## 2021-01-22 MED ORDER — INSULIN GLARGINE (1 UNIT DIAL) 300 UNIT/ML ~~LOC~~ SOPN: 20.0000 [IU] | PEN_INJECTOR | Freq: Every day | SUBCUTANEOUS | 1 refills | Status: DC

## 2021-01-22 MED ORDER — NOVOLOG FLEXPEN 100 UNIT/ML ~~LOC~~ SOPN: 4.0000 [IU] | PEN_INJECTOR | Freq: Three times a day (TID) | SUBCUTANEOUS | 0 refills | Status: DC

## 2021-01-22 NOTE — Progress Notes (Signed)
Endocrinology Consult Note       01/22/2021, 10:29 AM   Subjective:    Patient ID: Pamela [name], female    DOB: [birth year].  Pamela [name] is being seen in consultation for management of currently uncontrolled symptomatic diabetes requested by  [name], MD.   Past Medical History:  Diagnosis Date  . Anemia   . Asthma   . Atrial fibrillation [I48.91] 10/01/2014  . Bilateral leg cramps   . Chronic anticoagulation   . Chronic constipation   . Closed bimalleolar fracture of left ankle 02/26/2020  . Complication of anesthesia   . Deep venous thrombosis ([place]) 07/30/2013  . Diabetes mellitus   . Dizziness 05/23/2011  . DJD (degenerative joint disease)   . DOE (dyspnea on exertion) 03/03/2013   Onset early 2020  - 02/05/2019   Walked RA  2 laps @  approx 268ft each @ nl pace  stopped due to  Light headed, no sp or sob, no desats    . DVT (deep venous thrombosis) ([place]) 07/30/2013  . Dyspnea    occasionally  . Dyspnea on exertion 03/03/2013   Onset early 2020  - 02/05/2019   Walked RA  2 laps @  approx 286ft each @ nl pace  stopped due to  Light headed, no sp or sob, no desats    . Encounter for orthopedic follow-up care 03/10/2020  . GERD (gastroesophageal reflux disease)   . Heart murmur    as a child  . HTN (hypertension)   . Hypercholesteremia   . Hypertension 05/23/2011  . Hyponatremia 02/06/2019   Na 126 on no obvious meds to cause it with last na 133 in 2014   . Long term (current) use of anticoagulants 03/03/2013  . Low back pain 07/06/2018  . Low back strain 08/09/2018  . Neck pain 06/11/2020  . Obesity   . Osteoarthritis of right knee 07/06/2018  . Pain in right knee 07/06/2018  . Pneumonia   . PONV (postoperative nausea and vomiting)    needs pre-medication  . Protein S deficiency ([place])    prior DVT L knee, L arm DVT,  followed by Dr West Pugh at [facility]  . PVC's (premature  ventricular contractions)   . S/P cardiac catheterization 02/2013   Normal coronaries; low normal EF at 50%  . Sleep apnea    can't use the cpap (diagnosed 2017)  . Solitary pulmonary nodule on lung CT 02/06/2019   First noted 01/13/2014 > no change as of 12/21/2018  . Spondylolisthesis, lumbar region 08/09/2018  . Stroke [facility])    TIA in 2018 or 2019  . TIA (transient ischemic attack)   . Transient ischemic attack 07/30/2013  . Ventricular premature beats 05/23/2011    Past Surgical History:  Procedure Laterality Date  . CARPAL TUNNEL RELEASE  1994   bilateral  . CHOLECYSTECTOMY  1993  . COLONOSCOPY    . FOOT TENDON SURGERY Right   . left knee replacement  2006  . ORIF ANKLE FRACTURE Left 02/26/2020   Procedure: OPEN REDUCTION INTERNAL FIXATION (ORIF) ANKLE FRACTURE;  Surgeon: Nicholes Stairs, MD;  Location: [place];  Service: Orthopedics;  Laterality: Left;  90 mins  . thumb surgery Left 2018  . TONSILLECTOMY    . TOTAL ABDOMINAL HYSTERECTOMY  1988    Social History   Socioeconomic History  . Marital status: Married    Spouse name: [name]  . Number of children: Not on file  . Years of education: Not on file  . Highest education level: Not on file  Occupational History  . Not on file  Tobacco Use  . Smoking status: Never Smoker  . Smokeless tobacco: Never Used  Vaping Use  . Vaping Use: Never used  Substance and Sexual Activity  . Alcohol use: No  . Drug use: No  . Sexual activity: Not on file  Other Topics Concern  . Not on file  Social History Narrative   Lives with spouse in [place].  2 grown daughters.   Retired Glass blower/designer     Social Determinants of Radio broadcast assistant Strain: Not on file  Food Insecurity: No Food Insecurity  . Worried About Charity fundraiser in the Last Year: Never true  . Ran Out of Food in the Last Year: Never true  Transportation Needs: No Transportation Needs  . Lack of Transportation (Medical): No  . Lack of  Transportation (Non-Medical): No  Physical Activity: Not on file  Stress: Not on file  Social Connections: Not on file    Family History  Problem Relation Age of Onset  . Cirrhosis Mother   . Antithrombin III deficiency Mother        multiple emboli  . Ulcerative colitis Mother   . Coronary artery disease Father   . Heart attack Father   . Hypertension Father   . Protein S deficiency Sister        prior DVT  . Protein S deficiency Daughter   . Hypertension Sister   . Hypertension Brother   . Lung cancer Brother   . Heart attack Brother   . Heart attack Sister        age 64   . Stroke Neg Hx   . Colitis Neg Hx   . Colon polyps Neg Hx   . Esophageal cancer Neg Hx   . Liver cancer Neg Hx   . Pancreatic cancer Neg Hx   . Rectal cancer Neg Hx   . Stomach cancer Neg Hx     Outpatient Encounter Medications as of 01/22/2021  Medication Sig  . acetaminophen (TYLENOL) 500 MG tablet Take 500-1,000 mg by mouth every 6 (six) hours as needed (for pain.).  Marland Kitchen albuterol (VENTOLIN HFA) 108 (90 Base) MCG/ACT inhaler Inhale 1-2 puffs into the lungs every 6 (six) hours as needed for wheezing or shortness of breath.  Marland Kitchen aspirin EC 81 MG tablet Take 1 tablet (81 mg total) by mouth daily.  Marland Kitchen atorvastatin (LIPITOR) 40 MG tablet Take 1 tablet (40 mg total) by mouth daily at 12 noon.  Marland Kitchen azelastine (ASTELIN) 137 MCG/SPRAY nasal spray Place 2 sprays into the nose daily as needed for rhinitis or allergies.  . B Complex-C (B-COMPLEX WITH VITAMIN C) tablet Take 1 tablet by mouth daily with lunch.  . carvedilol (COREG) 25 MG tablet TAKE 1 TABLET(25 MG) BY MOUTH TWICE DAILY WITH A MEAL (Patient not taking: Reported on 01/12/2021)  . Cholecalciferol (VITAMIN D3) 50 MCG (2000 UT) TABS Take 2,000 Units by mouth daily with lunch.   . Continuous Blood Gluc Receiver (FREESTYLE LIBRE 2 READER) DEVI 1 each by Does not apply route 4 (four) times daily -  after meals and at bedtime. E11.40  . Continuous Blood Gluc  Sensor (FREESTYLE LIBRE 2 SENSOR) MISC 2 each by Does not apply route every 14 (fourteen) days. E11.40  . denosumab (PROLIA) 60 MG/ML SOSY injection Inject 60 mg into the skin every 6 (six) months.  Marland Kitchen estradiol (ESTRACE) 0.1 MG/GM vaginal cream Uses twice weekly  . ferrous sulfate 325 (65 FE) MG tablet Take 325 mg by mouth daily.  . furosemide (LASIX) 20 MG tablet Take 1 tablet (20 mg total) by mouth daily.  . insulin aspart (NOVOLOG FLEXPEN) 100 UNIT/ML FlexPen Inject 4-7 Units into the skin 3 (three) times daily with meals. If glucose above 90 and eating.  Follow SSI  . insulin glargine, 1 Unit Dial, (TOUJEO) 300 UNIT/ML Solostar Pen Inject 20 Units into the skin at bedtime.  . Insulin Pen Needle (BD PEN NEEDLE NANO 2ND GEN) 32G X 4 MM MISC USE DAILY AS DIRECTED  . ipratropium-albuterol (DUONEB) 0.5-2.5 (3) MG/3ML SOLN Take 3 mLs by nebulization 4 (four) times daily as needed (wheezing/shortness of breath).  Marland Kitchen lisinopril (ZESTRIL) 20 MG tablet Take 2 tablets (40 mg total) by mouth 2 (two) times daily.  . Magnesium Oxide 400 (240 Mg) MG TABS TAKE 2 TABLETS(800 MG) BY MOUTH TWICE DAILY  . Menthol-Methyl Salicylate (SALONPAS PAIN RELIEF PATCH) PTCH Apply 1 patch topically daily as needed (pain.).  Marland Kitchen methocarbamol (ROBAXIN) 500 MG tablet Take 500 mg by mouth every 8 (eight) hours as needed for muscle spasms.  . ondansetron (ZOFRAN-ODT) 8 MG disintegrating tablet Take 8 mg by mouth every 8 (eight) hours as needed for nausea or vomiting.   Marland Kitchen oxyCODONE (OXY IR/ROXICODONE) 5 MG immediate release tablet Take 1 tablet (5 mg total) by mouth every 6 (six) hours as needed for severe pain.  . pantoprazole (PROTONIX) 40 MG tablet Take 1 tablet (40 mg total) by mouth every morning.  . polyethylene glycol (MIRALAX / GLYCOLAX) 17 g packet Take 17 g by mouth as needed. Uses 1-2 times weekly  . pregabalin (LYRICA) 25 MG capsule Take 1 capsule (25 mg total) by mouth 3 (three) times daily.  Marland Kitchen senna-docusate  (SENOKOT-S) 8.6-50 MG tablet Take 1 tablet by mouth at bedtime.  . sodium chloride (OCEAN) 0.65 % SOLN nasal spray Place 1-2 sprays into both nostrils 4 (four) times daily as needed for congestion.  . sulfamethoxazole-trimethoprim (BACTRIM DS) 800-160 MG tablet Take 1 tablet by mouth 2 (two) times daily. (Patient not taking: No sig reported)  . tolterodine (DETROL LA) 2 MG 24 hr capsule Take 2 mg by mouth at bedtime.  (Patient not taking: No sig reported)  . Vibegron (GEMTESA PO) Take by mouth at bedtime.  Marland Kitchen warfarin (COUMADIN) 1 MG tablet Take 1 tablet (1 mg total) by mouth See admin instructions. Take as directed (Patient taking differently: Take 3 mg by mouth See admin instructions. Mon-Wed-Fri)  . warfarin (COUMADIN) 5 MG tablet TAKE 1 TABLET BY MOUTH EVERY DAY  . [DISCONTINUED] insulin aspart (NOVOLOG FLEXPEN) 100 UNIT/ML FlexPen 10 U before largest meal (Patient taking differently: Inject 8 Units into the skin. 4 units before lunch lunch and supper)  . [DISCONTINUED] insulin glargine, 1 Unit Dial, (TOUJEO) 300 UNIT/ML Solostar Pen Inject 50 Units into the skin daily. (Patient taking differently: Inject 35 Units into the skin daily. Reduce to 35 units)   No facility-administered encounter medications on file as of 01/22/2021.    ALLERGIES: Allergies  Allergen Reactions  . Ketek [Telithromycin] Nausea And Vomiting and Rash  .  Loxapine Succinate Hives  . Naproxen Sodium Anaphylaxis and Hives  . Amoxapine And Related Hives  . Darvon Nausea And Vomiting  . Belsomra [Suvorexant]     "Nightmares"  . Cymbalta [Duloxetine Hcl]     "Blackouts"  . Gabapentin Other (See Comments)    Blurry vision  . Amoxicillin Rash  . Propoxyphene Nausea And Vomiting    VACCINATION STATUS: Immunization History  Administered Date(s) Administered  . Fluad Quad(high Dose 65+) 05/26/2020  . Influenza,inj,Quad PF,6+ Mos 06/04/2013  . PFIZER(Purple Top)SARS-COV-2 Vaccination 09/22/2019, 10/12/2019, 06/16/2020   . Pneumococcal Conjugate-13 07/30/2014  . Pneumococcal Polysaccharide-23 03/31/2008  . Tdap 07/25/2011  . Zoster 03/31/2008, 06/03/2011    Diabetes She presents for her initial diabetic visit. She has type 2 diabetes mellitus. Onset time: diagnosed at approx age of 64. Her disease course has been fluctuating. Hypoglycemia symptoms include nervousness/anxiousness, sweats and tremors. Associated symptoms include foot paresthesias. Hypoglycemia complications include nocturnal hypoglycemia. Symptoms are stable. Diabetic complications include a CVA, heart disease, nephropathy and peripheral neuropathy. Risk factors for coronary artery disease include diabetes mellitus, dyslipidemia, family history, hypertension, post-menopausal and sedentary lifestyle. Current diabetic treatment includes intensive insulin program. She is compliant with treatment most of the time. Her weight is increasing steadily. She is following a generally unhealthy diet. When asked about meal planning, she reported none. She has not had a previous visit with a dietitian. She rarely participates in exercise. (She presents today for her consultation with her CGM device and logs showing widely fluctuating glucose pattern.  Her most recent A1c was 9.8% on 11/12/20.  She has frequent low glucose readings requiring her to treat it with juice or sugary beverages and snacks.  She typically consumes unsweet tea (with stevia), sugar free protein shakes, water and juice.  She sometimes skips lunch.  She does not engage in routine physical activity outside of housework.  She has plans to start on stationary recumbent bike. ) An ACE inhibitor/angiotensin II receptor blocker is being taken. She does not see a podiatrist.Eye exam is current.  Hyperlipidemia This is a chronic problem. The current episode started more than 1 year ago. The problem is controlled. Recent lipid tests were reviewed and are normal. Exacerbating diseases include chronic renal  disease and diabetes. Factors aggravating her hyperlipidemia include beta blockers and fatty foods. Current antihyperlipidemic treatment includes statins. The current treatment provides significant improvement of lipids. Compliance problems include adherence to exercise.  Risk factors for coronary artery disease include diabetes mellitus, dyslipidemia, hypertension, post-menopausal and a sedentary lifestyle.  Hypertension This is a chronic problem. The current episode started more than 1 year ago. The problem has been resolved since onset. The problem is controlled. Associated symptoms include sweats. There are no associated agents to hypertension. Risk factors for coronary artery disease include diabetes mellitus, dyslipidemia, family history, post-menopausal state and sedentary lifestyle. Past treatments include beta blockers, diuretics and ACE inhibitors. The current treatment provides moderate improvement. There are no compliance problems.  Hypertensive end-organ damage includes kidney disease, CVA and heart failure. Identifiable causes of hypertension include chronic renal disease and sleep apnea.     Review of systems  Constitutional: + steadily increasing body weight,  current Body mass index is 31.76 kg/m. , no fatigue, no subjective hyperthermia, no subjective hypothermia Eyes: no blurry vision, no xerophthalmia ENT: no sore throat, no nodules palpated in throat, no dysphagia/odynophagia, no hoarseness Cardiovascular: no chest pain, no shortness of breath, no palpitations, no leg swelling Respiratory: no cough, no shortness of breath  Gastrointestinal: no nausea/vomiting/diarrhea Musculoskeletal: right shoulder, arm, and neck pain (seeing ortho next week), old injury to left ankle (may need hardware removal in future) Skin: no rashes, no hyperemia Neurological: no tremors, no numbness, no tingling, no dizziness Psychiatric: no depression, no anxiety  Objective:     BP 134/70   Pulse  61   Ht 5\' 4"  (1.626 m)   Wt 185 lb (83.9 kg)   BMI 31.76 kg/m   Wt Readings from Last 3 Encounters:  01/22/21 185 lb (83.9 kg)  12/09/20 174 lb 3.2 oz (79 kg)  11/12/20 166 lb (75.3 kg)     BP Readings from Last 3 Encounters:  01/22/21 134/70  12/09/20 138/60  11/13/20 (!) 148/78     Physical Exam- Limited  Constitutional:  Body mass index is 31.76 kg/m. , not in acute distress, normal state of mind Eyes:  EOMI, no exophthalmos Neck: Supple Cardiovascular: RRR, no murmers, rubs, or gallops, no edema Respiratory: Adequate breathing efforts, no crackles, rales, rhonchi, or wheezing Musculoskeletal: no gross deformities, strength intact in all four extremities, no gross restriction of joint movements Skin:  no rashes, no hyperemia Neurological: no tremor with outstretched hands    CMP ( most recent) CMP     Component Value Date/Time   NA 135 12/09/2020 0758   K 5.1 12/09/2020 0758   CL 97 12/09/2020 0758   CO2 23 12/09/2020 0758   GLUCOSE 129 (H) 12/09/2020 0758   GLUCOSE 132 (H) 03/10/2020 1646   BUN 20 12/09/2020 0758   CREATININE 1.01 (H) 12/09/2020 0758   CALCIUM 9.1 12/09/2020 0758   PROT 6.2 12/09/2020 0758   ALBUMIN 3.9 12/09/2020 0758   AST 23 12/09/2020 0758   ALT 22 12/09/2020 0758   ALKPHOS 73 12/09/2020 0758   BILITOT 0.4 12/09/2020 0758   GFRNONAA 61 09/11/2020 1123   GFRAA 70 09/11/2020 1123     Diabetic Labs (most recent): Lab Results  Component Value Date   HGBA1C 9.8 (H) 11/12/2020   HGBA1C 8.6 (H) 08/13/2020   HGBA1C 7.4 (H) 02/26/2020     Lipid Panel ( most recent) Lipid Panel     Component Value Date/Time   CHOL 135 11/12/2020 1032   TRIG 96 11/12/2020 1032   HDL 49 11/12/2020 1032   CHOLHDL 2.8 11/12/2020 1032   LDLCALC 68 11/12/2020 1032   LABVLDL 18 11/12/2020 1032      Lab Results  Component Value Date   TSH 2.020 11/14/2019   TSH 1.24 02/05/2019   FREET4 1.23 01/21/2013           Assessment & Plan:   1)  Type 2 diabetes mellitus with stage 3b chronic kidney disease, with long-term current use of insulin ([place])  She presents today for her consultation with her CGM device and logs showing widely fluctuating glucose pattern.  Her most recent A1c was 9.8% on 11/12/20.  She has frequent low glucose readings requiring her to treat it with juice or sugary beverages and snacks.  She typically consumes unsweet tea (with stevia), sugar free protein shakes, water and juice.  She sometimes skips lunch.  She does not engage in routine physical activity outside of housework.  She has plans to start on stationary recumbent bike.   - Pamela [name] has currently uncontrolled symptomatic type 2 DM since 75 years of age, with most recent A1c of 9.8 %.   -Recent labs reviewed.  - I had a long discussion with her about the progressive  nature of diabetes and the pathology behind its complications. -her diabetes is complicated by CKD stage 3, CVA and she remains at a high risk for more acute and chronic complications which include CAD, CVA, CKD, retinopathy, and neuropathy. These are all discussed in detail with her.  - I have counseled her on diet  and weight management by adopting a carbohydrate restricted/protein rich diet. Patient is encouraged to switch to unprocessed or minimally processed complex starch and increased protein intake (animal or plant source), fruits, and vegetables. -  she is advised to stick to a routine mealtimes to eat 3 meals a day and avoid unnecessary snacks (to snack only to correct hypoglycemia).   - she acknowledges that there is a room for improvement in her food and drink choices. - Suggestion is made for her to avoid simple carbohydrates from her diet including Cakes, Sweet Desserts, Ice Cream, Soda (diet and regular), Sweet Tea, Candies, Chips, Cookies, Store Bought Juices, Alcohol in Excess of  1-2 drinks a day, Artificial Sweeteners, Coffee Creamer, and "Sugar-free"  Products. This will help patient to have more stable blood glucose profile and potentially avoid unintended weight gain.  - I have approached her with the following individualized plan to manage  her diabetes and patient agrees:   -She is advised to lower her dose of Toujeo to 20 units and start taking it before bed (instead of in the morning).  She is also advised to change the way she takes her Novolog to 4-7 units TID with meals if glucose is above 90 and she is eating.  Specific instructions on how to titrate her insulin dose based on glucose readings given to patient in writing.  She demonstrated her ability to interpret the SSI chart properly with me today.  -she is encouraged to continue monitoring glucose 4 times daily using her CGM, before meals and before bed, to log their readings on the clinic sheets provided, and bring them to review at follow up appointment in 2 weeks.  - she is warned not to take insulin without proper monitoring per orders. - Adjustment parameters are given to her for hypo and hyperglycemia in writing. - she is encouraged to call clinic for blood glucose levels less than 70 or above 300 mg /dl.  - Specific targets for  A1c;  LDL, HDL,  and Triglycerides were discussed with the patient.  2) Blood Pressure /Hypertension:  her blood pressure is controlled to target.   she is advised to continue her current medications including Lasix 20 mg po daily, Lisinopril 40 mg po twice daily.  3) Lipids/Hyperlipidemia:    Review of her recent lipid panel from 11/12/20 showed controlled LDL at 68 .  she  is advised to continue Lipitor 40 mg daily at bedtime.  Side effects and precautions discussed with her.  4)  Weight/Diet:  her Body mass index is 31.76 kg/m.  -   she is not a candidate for major weight loss.  Exercise, and detailed carbohydrates information provided  -  detailed on discharge instructions.  5) Chronic Care/Health Maintenance: -she is on ACEI/ARB and Statin  medications and is encouraged to initiate and continue to follow up with Ophthalmology, Dentist, Podiatrist at least yearly or according to recommendations, and advised to stay away from smoking. I have recommended yearly flu vaccine and pneumonia vaccine at least every 5 years; moderate intensity exercise for up to 150 minutes weekly; and sleep for at least 7 hours a day.  - she is  advised to maintain close follow up with Cox, Elnita Maxwell, MD for primary care needs, as well as her other providers for optimal and coordinated care.   - Time spent in this patient care: 60 min, of which > 50% was spent in counseling her about her diabetes and the rest reviewing her blood glucose logs, discussing her hypoglycemia and hyperglycemia episodes, reviewing her current and previous labs/studies (including abstraction from other facilities) and medications doses and developing a long term treatment plan based on the latest standards of care/guidelines; and documenting her care.    Please refer to Patient Instructions for Blood Glucose Monitoring and Insulin/Medications Dosing Guide" in media tab for additional information. Please also refer to "Patient Self Inventory" in the Media tab for reviewed elements of pertinent patient history.  Pamela [name] participated in the discussions, expressed understanding, and voiced agreement with the above plans.  All questions were answered to her satisfaction. she is encouraged to contact clinic should she have any questions or concerns prior to her return visit.   Follow up plan: - Return in about 2 weeks (around 02/05/2021) for Diabetes F/U, Bring meter and logs.  Rayetta Pigg, [facility] [address] [place], [place] [address] Phone: [phone] Fax: [phone]  01/22/2021, 10:29 AM

## 2021-01-22 NOTE — Patient Instructions (Signed)
Diabetes Mellitus and Nutrition, Adult When you have diabetes, or diabetes mellitus, it is very important to have healthy eating habits because your blood sugar (glucose) levels are greatly affected by what you eat and drink. Eating healthy foods in the right amounts, at about the same times every day, can help you:  Control your blood glucose.  Lower your risk of heart disease.  Improve your blood pressure.  Reach or maintain a healthy weight. What can affect my meal plan? Every person with diabetes is different, and each person has different needs for a meal plan. Your health care provider may recommend that you work with a dietitian to make a meal plan that is best for you. Your meal plan may vary depending on factors such as:  The calories you need.  The medicines you take.  Your weight.  Your blood glucose, blood pressure, and cholesterol levels.  Your activity level.  Other health conditions you have, such as heart or kidney disease. How do carbohydrates affect me? Carbohydrates, also called carbs, affect your blood glucose level more than any other type of food. Eating carbs naturally raises the amount of glucose in your blood. Carb counting is a method for keeping track of how many carbs you eat. Counting carbs is important to keep your blood glucose at a healthy level, especially if you use insulin or take certain oral diabetes medicines. It is important to know how many carbs you can safely have in each meal. This is different for every person. Your dietitian can help you calculate how many carbs you should have at each meal and for each snack. How does alcohol affect me? Alcohol can cause a sudden decrease in blood glucose (hypoglycemia), especially if you use insulin or take certain oral diabetes medicines. Hypoglycemia can be a life-threatening condition. Symptoms of hypoglycemia, such as sleepiness, dizziness, and confusion, are similar to symptoms of having too much  alcohol.  Do not drink alcohol if: ? Your health care provider tells you not to drink. ? You are pregnant, may be pregnant, or are planning to become pregnant.  If you drink alcohol: ? Do not drink on an empty stomach. ? Limit how much you use to:  0-1 drink a day for women.  0-2 drinks a day for men. ? Be aware of how much alcohol is in your drink. In the [place] one drink equals one 12 oz bottle of beer (355 mL), one 5 oz glass of wine (148 mL), or one 1 oz glass of hard liquor (44 mL). ? Keep yourself hydrated with water, diet soda, or unsweetened iced tea.  Keep in mind that regular soda, juice, and other mixers may contain a lot of sugar and must be counted as carbs. What are tips for following this plan? Reading food labels  Start by checking the serving size on the "Nutrition Facts" label of packaged foods and drinks. The amount of calories, carbs, fats, and other nutrients listed on the label is based on one serving of the item. Many items contain more than one serving per package.  Check the total grams (g) of carbs in one serving. You can calculate the number of servings of carbs in one serving by dividing the total carbs by 15. For example, if a food has 30 g of total carbs per serving, it would be equal to 2 servings of carbs.  Check the number of grams (g) of saturated fats and trans fats in one serving. Choose foods that have   a low amount or none of these fats.  Check the number of milligrams (mg) of salt (sodium) in one serving. Most people should limit total sodium intake to less than 2,300 mg per day.  Always check the nutrition information of foods labeled as "low-fat" or "nonfat." These foods may be higher in added sugar or refined carbs and should be avoided.  Talk to your dietitian to identify your daily goals for nutrients listed on the label. Shopping  Avoid buying canned, pre-made, or processed foods. These foods tend to be high in fat, sodium, and added  sugar.  Shop around the outside edge of the grocery store. This is where you will most often find fresh fruits and vegetables, bulk grains, fresh meats, and fresh dairy. Cooking  Use low-heat cooking methods, such as baking, instead of high-heat cooking methods like deep frying.  Cook using healthy oils, such as olive, canola, or sunflower oil.  Avoid cooking with butter, cream, or high-fat meats. Meal planning  Eat meals and snacks regularly, preferably at the same times every day. Avoid going long periods of time without eating.  Eat foods that are high in fiber, such as fresh fruits, vegetables, beans, and whole grains. Talk with your dietitian about how many servings of carbs you can eat at each meal.  Eat 4-6 oz (112-168 g) of lean protein each day, such as lean meat, chicken, fish, eggs, or tofu. One ounce (oz) of lean protein is equal to: ? 1 oz (28 g) of meat, chicken, or fish. ? 1 egg. ?  cup (62 g) of tofu.  Eat some foods each day that contain healthy fats, such as avocado, nuts, seeds, and fish.   What foods should I eat? Fruits Berries. Apples. Oranges. Peaches. Apricots. Plums. Grapes. Mango. Papaya. Pomegranate. Kiwi. Cherries. Vegetables Lettuce. Spinach. Leafy greens, including kale, chard, collard greens, and mustard greens. Beets. Cauliflower. Cabbage. Broccoli. Carrots. Green beans. Tomatoes. Peppers. Onions. Cucumbers. Brussels sprouts. Grains Whole grains, such as whole-wheat or whole-grain bread, crackers, tortillas, cereal, and pasta. Unsweetened oatmeal. Quinoa. Brown or wild rice. Meats and other proteins Seafood. Poultry without skin. Lean cuts of poultry and beef. Tofu. Nuts. Seeds. Dairy Low-fat or fat-free dairy products such as milk, yogurt, and cheese. The items listed above may not be a complete list of foods and beverages you can eat. Contact a dietitian for more information. What foods should I avoid? Fruits Fruits canned with  syrup. Vegetables Canned vegetables. Frozen vegetables with butter or cream sauce. Grains Refined white flour and flour products such as bread, pasta, snack foods, and cereals. Avoid all processed foods. Meats and other proteins Fatty cuts of meat. Poultry with skin. Breaded or fried meats. Processed meat. Avoid saturated fats. Dairy Full-fat yogurt, cheese, or milk. Beverages Sweetened drinks, such as soda or iced tea. The items listed above may not be a complete list of foods and beverages you should avoid. Contact a dietitian for more information. Questions to ask a health care provider  Do I need to meet with a diabetes educator?  Do I need to meet with a dietitian?  What number can I call if I have questions?  When are the best times to check my blood glucose? Where to find more information:  American Diabetes Association: diabetes.org  Academy of Nutrition and Dietetics: www.eatright.org  National Institute of Diabetes and Digestive and Kidney Diseases: www.niddk.nih.gov  Association of Diabetes Care and Education Specialists: www.diabeteseducator.org Summary  It is important to have healthy eating   habits because your blood sugar (glucose) levels are greatly affected by what you eat and drink.  A healthy meal plan will help you control your blood glucose and maintain a healthy lifestyle.  Your health care provider may recommend that you work with a dietitian to make a meal plan that is best for you.  Keep in mind that carbohydrates (carbs) and alcohol have immediate effects on your blood glucose levels. It is important to count carbs and to use alcohol carefully. This information is not intended to replace advice given to you by your health care provider. Make sure you discuss any questions you have with your health care provider. Document Revised: 07/30/2019 Document Reviewed: 07/30/2019 [employer] Patient Education  2021 [employer]  

## 2021-01-25 ENCOUNTER — Ambulatory Visit (INDEPENDENT_AMBULATORY_CARE_PROVIDER_SITE_OTHER): Payer: Medicare PPO | Admitting: Cardiology

## 2021-01-25 ENCOUNTER — Encounter: Payer: Self-pay | Admitting: Cardiology

## 2021-01-25 ENCOUNTER — Other Ambulatory Visit: Payer: Self-pay

## 2021-01-25 VITALS — BP 102/60 | HR 67 | Ht 64.0 in | Wt 185.2 lb

## 2021-01-25 DIAGNOSIS — E1159 Type 2 diabetes mellitus with other circulatory complications: Secondary | ICD-10-CM

## 2021-01-25 DIAGNOSIS — I493 Ventricular premature depolarization: Secondary | ICD-10-CM | POA: Diagnosis not present

## 2021-01-25 DIAGNOSIS — G4739 Other sleep apnea: Secondary | ICD-10-CM | POA: Diagnosis not present

## 2021-01-25 DIAGNOSIS — I10 Essential (primary) hypertension: Secondary | ICD-10-CM | POA: Diagnosis not present

## 2021-01-25 DIAGNOSIS — I480 Paroxysmal atrial fibrillation: Secondary | ICD-10-CM

## 2021-01-25 NOTE — Patient Instructions (Signed)

## 2021-01-25 NOTE — Progress Notes (Signed)
Cardiology Office Note:    Date:  01/26/2021   ID:  Pamela [name], DOB 1945/12/13, MRN [id number]  PCP:  Blane Oharaox, Kirsten, MD  Cardiologist:  Thomasene RippleKardie Jayelle Page, DO  Electrophysiologist:  None   Referring MD: Blane Oharaox, Kirsten, MD   I have been doing fine  History of Present Illness:    Pamela [name] is a 75 y.o. female with a hx of Diabetes Mellitus, GERD, protein S deficiency chronic anticoagulation with Coumadinand has had DVT in the past,hypertension, hypercholesteremia, PVC, Paroxysmal atrial fibrillation history of a left heart catheterization with normal coronaries, history of TIA.She had a ORIF ankle fracture in June and status post open reduction internal fixation.  I last saw the patient in November 2021 at that time she was experiencing significant elevated blood pressure. We discussed blood pressure control at that time I increased her lisinopril to 5 mg daily and she remain on her carvedilol 25 mg twice daily and her Lasix was twice daily as well.  I saw the patient on August 14, 2020 at that time she reports she has been experiencing some dizziness therefore I placed a monitor on the patient making sure that the symptoms were not caused due to an arrhythmia. I also cut back on her Lasix from twice a day to once daily.  I saw her on 09/11/2020, at that time we increase the lisinopril to 20 mg daily. She is her today. She offers no complaints. She did read her blood pressures to me her systolic is averaging in the 150s to 160s.  She has had some dizziness but she did see ENT who said that this could be inner ear alignment issues.  I did see the patient via telemedicine on September 26, 2019 at that time she appeared to be doing well.  No medication changes were made.  Visit I saw her her carvedilol has been stopped and her lisinopril has been increased.  Also to review her blood pressure readings and this seems to be at target.  No lightheadedness or  dizziness.  Past Medical History:  Diagnosis Date  . Anemia   . Asthma   . Atrial fibrillation [I48.91] 10/01/2014  . Bilateral leg cramps   . Chronic anticoagulation   . Chronic constipation   . Closed bimalleolar fracture of left ankle 02/26/2020  . Complication of anesthesia   . Deep venous thrombosis (HCC) 07/30/2013  . Diabetes mellitus   . Dizziness 05/23/2011  . DJD (degenerative joint disease)   . DOE (dyspnea on exertion) 03/03/2013   Onset early 2020  - 02/05/2019   Walked RA  2 laps @  approx 24350ft each @ nl pace  stopped due to  Light headed, no sp or sob, no desats    . DVT (deep venous thrombosis) (HCC) 07/30/2013  . Dyspnea    occasionally  . Dyspnea on exertion 03/03/2013   Onset early 2020  - 02/05/2019   Walked RA  2 laps @  approx 27850ft each @ nl pace  stopped due to  Light headed, no sp or sob, no desats    . Encounter for orthopedic follow-up care 03/10/2020  . GERD (gastroesophageal reflux disease)   . Heart murmur    as a child  . HTN (hypertension)   . Hypercholesteremia   . Hypertension 05/23/2011  . Hyponatremia 02/06/2019   Na 126 on no obvious meds to cause it with last na 133 in 2014   . Long term (current) use of anticoagulants  03/03/2013  . Low back pain 07/06/2018  . Low back strain 08/09/2018  . Neck pain 06/11/2020  . Obesity   . Osteoarthritis of right knee 07/06/2018  . Pain in right knee 07/06/2018  . Pneumonia   . PONV (postoperative nausea and vomiting)    needs pre-medication  . Protein S deficiency ([place])    prior DVT L knee, L arm DVT,  followed by Dr West Pugh at [facility]  . PVC's (premature ventricular contractions)   . S/P cardiac catheterization 02/2013   Normal coronaries; low normal EF at 50%  . Sleep apnea    can't use the cpap (diagnosed 2017)  . Solitary pulmonary nodule on lung CT 02/06/2019   First noted 01/13/2014 > no change as of 12/21/2018  . Spondylolisthesis, lumbar region 08/09/2018  . Stroke [facility])    TIA in 2018 or 2019  . TIA  (transient ischemic attack)   . Transient ischemic attack 07/30/2013  . Ventricular premature beats 05/23/2011    Past Surgical History:  Procedure Laterality Date  . CARPAL TUNNEL RELEASE  1994   bilateral  . CHOLECYSTECTOMY  1993  . COLONOSCOPY    . FOOT TENDON SURGERY Right   . left knee replacement  2006  . ORIF ANKLE FRACTURE Left 02/26/2020   Procedure: OPEN REDUCTION INTERNAL FIXATION (ORIF) ANKLE FRACTURE;  Surgeon: Nicholes Stairs, MD;  Location: [place];  Service: Orthopedics;  Laterality: Left;  90 mins  . thumb surgery Left 2018  . TONSILLECTOMY    . TOTAL ABDOMINAL HYSTERECTOMY  1988    Current Medications: Current Meds  Medication Sig  . acetaminophen (TYLENOL) 500 MG tablet Take 500-1,000 mg by mouth every 6 (six) hours as needed (for pain.).  Marland Kitchen albuterol (VENTOLIN HFA) 108 (90 Base) MCG/ACT inhaler Inhale 1-2 puffs into the lungs every 6 (six) hours as needed for wheezing or shortness of breath.  Marland Kitchen aspirin EC 81 MG tablet Take 1 tablet (81 mg total) by mouth daily.  Marland Kitchen atorvastatin (LIPITOR) 40 MG tablet Take 1 tablet (40 mg total) by mouth daily at 12 noon.  Marland Kitchen azelastine (ASTELIN) 137 MCG/SPRAY nasal spray Place 2 sprays into the nose daily as needed for rhinitis or allergies.  . B Complex-C (B-COMPLEX WITH VITAMIN C) tablet Take 1 tablet by mouth daily with lunch.  . Cholecalciferol (VITAMIN D3) 50 MCG (2000 UT) TABS Take 2,000 Units by mouth daily with lunch.   . Continuous Blood Gluc Receiver (FREESTYLE LIBRE 2 READER) DEVI 1 each by Does not apply route 4 (four) times daily - after meals and at bedtime. E11.40  . Continuous Blood Gluc Sensor (FREESTYLE LIBRE 2 SENSOR) MISC 2 each by Does not apply route every 14 (fourteen) days. E11.40  . denosumab (PROLIA) 60 MG/ML SOSY injection Inject 60 mg into the skin every 6 (six) months.  Marland Kitchen estradiol (ESTRACE) 0.1 MG/GM vaginal cream Uses twice weekly  . ferrous sulfate 325 (65 FE) MG tablet Take 325 mg by mouth daily.   . furosemide (LASIX) 20 MG tablet Take 1 tablet (20 mg total) by mouth daily.  . insulin aspart (NOVOLOG FLEXPEN) 100 UNIT/ML FlexPen Inject 4-7 Units into the skin 3 (three) times daily with meals. If glucose above 90 and eating.  Follow SSI  . insulin glargine, 1 Unit Dial, (TOUJEO) 300 UNIT/ML Solostar Pen Inject 20 Units into the skin at bedtime.  . Insulin Pen Needle (BD PEN NEEDLE NANO 2ND GEN) 32G X 4 MM MISC USE DAILY AS DIRECTED  .  ipratropium-albuterol (DUONEB) 0.5-2.5 (3) MG/3ML SOLN Take 3 mLs by nebulization 4 (four) times daily as needed (wheezing/shortness of breath).  Marland Kitchen lisinopril (ZESTRIL) 20 MG tablet Take 2 tablets (40 mg total) by mouth 2 (two) times daily.  . Magnesium Oxide 400 (240 Mg) MG TABS TAKE 2 TABLETS(800 MG) BY MOUTH TWICE DAILY  . Menthol-Methyl Salicylate (SALONPAS PAIN RELIEF PATCH) PTCH Apply 1 patch topically daily as needed (pain.).  Marland Kitchen methocarbamol (ROBAXIN) 500 MG tablet Take 500 mg by mouth every 8 (eight) hours as needed for muscle spasms.  . ondansetron (ZOFRAN-ODT) 8 MG disintegrating tablet Take 8 mg by mouth every 8 (eight) hours as needed for nausea or vomiting.   Marland Kitchen oxyCODONE (OXY IR/ROXICODONE) 5 MG immediate release tablet Take 1 tablet (5 mg total) by mouth every 6 (six) hours as needed for severe pain.  . pantoprazole (PROTONIX) 40 MG tablet Take 1 tablet (40 mg total) by mouth every morning.  . polyethylene glycol (MIRALAX / GLYCOLAX) 17 g packet Take 17 g by mouth as needed. Uses 1-2 times weekly  . pregabalin (LYRICA) 25 MG capsule Take 1 capsule (25 mg total) by mouth 3 (three) times daily.  Marland Kitchen senna-docusate (SENOKOT-S) 8.6-50 MG tablet Take 1 tablet by mouth at bedtime.  . sodium chloride (OCEAN) 0.65 % SOLN nasal spray Place 1-2 sprays into both nostrils 4 (four) times daily as needed for congestion.  . sulfamethoxazole-trimethoprim (BACTRIM DS) 800-160 MG tablet Take 1 tablet by mouth 2 (two) times daily.  . Vibegron (GEMTESA PO) Take by  mouth at bedtime.  Marland Kitchen warfarin (COUMADIN) 1 MG tablet Take 1 tablet (1 mg total) by mouth See admin instructions. Take as directed (Patient taking differently: Take 3 mg by mouth See admin instructions. Mon-Wed-Fri)  . warfarin (COUMADIN) 5 MG tablet TAKE 1 TABLET BY MOUTH EVERY DAY  . [DISCONTINUED] carvedilol (COREG) 25 MG tablet TAKE 1 TABLET(25 MG) BY MOUTH TWICE DAILY WITH A MEAL     Allergies:   Ketek [telithromycin], Loxapine succinate, Naproxen sodium, Amoxapine and related, Darvon, Belsomra [suvorexant], Cymbalta [duloxetine hcl], Gabapentin, Amoxicillin, and Propoxyphene   Social History   Socioeconomic History  . Marital status: Married    Spouse name: [name]  . Number of children: Not on file  . Years of education: Not on file  . Highest education level: Not on file  Occupational History  . Not on file  Tobacco Use  . Smoking status: Never Smoker  . Smokeless tobacco: Never Used  Vaping Use  . Vaping Use: Never used  Substance and Sexual Activity  . Alcohol use: No  . Drug use: No  . Sexual activity: Not on file  Other Topics Concern  . Not on file  Social History Narrative   Lives with spouse in [place].  2 grown daughters.   Retired Glass blower/designer     Social Determinants of Radio broadcast assistant Strain: Not on file  Food Insecurity: No Food Insecurity  . Worried About Charity fundraiser in the Last Year: Never true  . Ran Out of Food in the Last Year: Never true  Transportation Needs: No Transportation Needs  . Lack of Transportation (Medical): No  . Lack of Transportation (Non-Medical): No  Physical Activity: Not on file  Stress: Not on file  Social Connections: Not on file     Family History: The patient's family history includes Antithrombin III deficiency in her mother; Cirrhosis in her mother; Coronary artery disease in her father; Heart attack  in her brother, father, and sister; Hypertension in her brother, father, and sister; Lung cancer in  her brother; Protein S deficiency in her daughter and sister; Ulcerative colitis in her mother. There is no history of Stroke, Colitis, Colon polyps, Esophageal cancer, Liver cancer, Pancreatic cancer, Rectal cancer, or Stomach cancer.  ROS:   Review of Systems  Constitution: Negative for decreased appetite, fever and weight gain.  HENT: Negative for congestion, ear discharge, hoarse voice and sore throat.   Eyes: Negative for discharge, redness, vision loss in right eye and visual halos.  Cardiovascular: Negative for chest pain, dyspnea on exertion, leg swelling, orthopnea and palpitations.  Respiratory: Negative for cough, hemoptysis, shortness of breath and snoring.   Endocrine: Negative for heat intolerance and polyphagia.  Hematologic/Lymphatic: Negative for bleeding problem. Does not bruise/bleed easily.  Skin: Negative for flushing, nail changes, rash and suspicious lesions.  Musculoskeletal: Negative for arthritis, joint pain, muscle cramps, myalgias, neck pain and stiffness.  Gastrointestinal: Negative for abdominal pain, bowel incontinence, diarrhea and excessive appetite.  Genitourinary: Negative for decreased libido, genital sores and incomplete emptying.  Neurological: Negative for brief paralysis, focal weakness, headaches and loss of balance.  Psychiatric/Behavioral: Negative for altered mental status, depression and suicidal ideas.  Allergic/Immunologic: Negative for HIV exposure and persistent infections.    EKGs/Labs/Other Studies Reviewed:    The following studies were reviewed today:   EKG: None today  EchocardiogramIMPRESSIONS  1. Left ventricular ejection fraction, by estimation, is 60 to 65%. The left ventricle has normal function. The left ventricle has no regional wall motion abnormalities. There is moderate concentric left ventricular hypertrophy. Left ventricular  diastolic parameters are consistent with Grade I diastolic dysfunction (impaired relaxation).   2. Right ventricular systolic function is normal. The right ventricular size is normal. There is normal pulmonary artery systolic pressure.  3. Left atrial size was mildly dilated.  4. The mitral valve is normal in structure. Trivial mitral valve regurgitation. No evidence of mitral stenosis.  5. The aortic valve is tricuspid. Aortic valve regurgitation is not visualized. No aortic stenosis is present.  6. The inferior vena cava is normal in size with greater than 50% respiratory variability, suggesting right atrial pressure of 3 mmHg.  Recent Labs: 03/10/2020: Pro B Natriuretic peptide (BNP) 135.0 09/11/2020: Magnesium 1.7 11/12/2020: Hemoglobin 13.2; Platelets 279 12/09/2020: ALT 22; BUN 20; Creatinine, Ser 1.01; Potassium 5.1; Sodium 135  Recent Lipid Panel    Component Value Date/Time   CHOL 135 11/12/2020 1032   TRIG 96 11/12/2020 1032   HDL 49 11/12/2020 1032   CHOLHDL 2.8 11/12/2020 1032   LDLCALC 68 11/12/2020 1032    Physical Exam:    VS:  BP 102/60   Pulse 67   Ht 5\' 4"  (1.626 m)   Wt 185 lb 3.2 oz (84 kg)   SpO2 97%   BMI 31.79 kg/m     Wt Readings from Last 3 Encounters:  01/25/21 185 lb 3.2 oz (84 kg)  01/22/21 185 lb (83.9 kg)  12/09/20 174 lb 3.2 oz (79 kg)     GEN: Well nourished, well developed in no acute distress HEENT: Normal NECK: No JVD; No carotid bruits LYMPHATICS: No lymphadenopathy CARDIAC: S1S2 noted,RRR, no murmurs, rubs, gallops RESPIRATORY:  Clear to auscultation without rales, wheezing or rhonchi  ABDOMEN: Soft, non-tender, non-distended, +bowel sounds, no guarding. EXTREMITIES: No edema, No cyanosis, no clubbing MUSCULOSKELETAL:  No deformity  SKIN: Warm and dry NEUROLOGIC:  Alert and oriented x 3, non-focal PSYCHIATRIC:  Normal  affect, good insight  ASSESSMENT:    1. Hypertension, unspecified type   2. Type 2 diabetes mellitus with vascular disease (HCC)   3. Paroxysmal atrial fibrillation (HCC)   4. Ventricular premature beats    5. Other sleep apnea    PLAN:     1.  She appeared to be doing well from a cardiovascular standpoint.  Her blood pressure is acceptable in the office today.  No medication changes will be made.  2.  She has been placed on insulin and is now following with endocrinologist in [place] which is actually far for the patient to drive we will refer the patient to our endocrinologist in [facility].  3.  Her warfarin is being followed by our oncology team.  4.  Insert weight loss  The patient is in agreement with the above plan. The patient left the office in stable condition.  The patient will follow up in 6 months or sooner if needed   Medication Adjustments/Labs and Tests Ordered: Current medicines are reviewed at length with the patient today.  Concerns regarding medicines are outlined above.  Orders Placed This Encounter  Procedures  . Ambulatory referral to Endocrinology   No orders of the defined types were placed in this encounter.   Patient Instructions  Medication Instructions:  Your physician recommends that you continue on your current medications as directed. Please refer to the Current Medication list given to you today.  *If you need a refill on your cardiac medications before your next appointment, please call your pharmacy*   Lab Work: None If you have labs (blood work) drawn today and your tests are completely normal, you will receive your results only by: Marland Kitchen MyChart Message (if you have MyChart) OR . A paper copy in the mail If you have any lab test that is abnormal or we need to change your treatment, we will call you to review the results.   Testing/Procedures: None   Follow-Up: At [facility], you and your health needs are our priority.  As part of our continuing mission to provide you with exceptional heart care, we have created designated Provider Care Teams.  These Care Teams include your primary Cardiologist (physician) and Advanced Practice  Providers (APPs -  Physician Assistants and Nurse Practitioners) who all work together to provide you with the care you need, when you need it.  We recommend signing up for the patient portal called "MyChart".  Sign up information is provided on this After Visit Summary.  MyChart is used to connect with patients for Virtual Visits (Telemedicine).  Patients are able to view lab/test results, encounter notes, upcoming appointments, etc.  Non-urgent messages can be sent to your provider as well.   To learn more about what you can do with MyChart, go to [email or web address].    Your next appointment:   6 month(s)  The format for your next appointment:   In Person  Provider:   Elease Hashimoto  - Dr. Servando Salina   Other Instructions      Adopting a Healthy Lifestyle.  Know what a healthy weight is for you (roughly BMI <25) and aim to maintain this   Aim for 7+ servings of fruits and vegetables daily   65-80+ fluid ounces of water or unsweet tea for healthy kidneys   Limit to max 1 drink of alcohol per day; avoid smoking/tobacco   Limit animal fats in diet for cholesterol and heart health - choose grass fed whenever available   Avoid highly  processed foods, and foods high in saturated/trans fats   Aim for low stress - take time to unwind and care for your mental health   Aim for 150 min of moderate intensity exercise weekly for heart health, and weights twice weekly for bone health   Aim for 7-9 hours of sleep daily   When it comes to diets, agreement about the perfect plan isnt easy to find, even among the experts. Experts at the [employer] developed an idea known as the Healthy Eating Plate. Just imagine a plate divided into logical, healthy portions.   The emphasis is on diet quality:   Load up on vegetables and fruits - one-half of your plate: Aim for color and variety, and remember that potatoes dont count.   Go for whole grains - one-quarter of your  plate: Whole wheat, barley, wheat berries, quinoa, oats, brown rice, and foods made with them. If you want pasta, go with whole wheat pasta.   Protein power - one-quarter of your plate: Fish, chicken, beans, and nuts are all healthy, versatile protein sources. Limit red meat.   The diet, however, does go beyond the plate, offering a few other suggestions.   Use healthy plant oils, such as olive, canola, soy, corn, sunflower and peanut. Check the labels, and avoid partially hydrogenated oil, which have unhealthy trans fats.   If youre thirsty, drink water. Coffee and tea are good in moderation, but skip sugary drinks and limit milk and dairy products to one or two daily servings.   The type of carbohydrate in the diet is more important than the amount. Some sources of carbohydrates, such as vegetables, fruits, whole grains, and beans-are healthier than others.   Finally, stay active  Signed, Berniece Salines, DO  01/26/2021 8:08 AM    [place]

## 2021-01-26 ENCOUNTER — Other Ambulatory Visit: Payer: Self-pay | Admitting: Legal Medicine

## 2021-01-26 DIAGNOSIS — I10 Essential (primary) hypertension: Secondary | ICD-10-CM

## 2021-01-26 MED ORDER — LISINOPRIL 20 MG PO TABS: 40.0000 mg | ORAL_TABLET | Freq: Two times a day (BID) | ORAL | 1 refills | Status: DC

## 2021-01-27 ENCOUNTER — Other Ambulatory Visit: Payer: Self-pay

## 2021-01-27 DIAGNOSIS — D6859 Other primary thrombophilia: Secondary | ICD-10-CM | POA: Diagnosis not present

## 2021-01-27 DIAGNOSIS — Z7901 Long term (current) use of anticoagulants: Secondary | ICD-10-CM | POA: Diagnosis not present

## 2021-01-27 DIAGNOSIS — I10 Essential (primary) hypertension: Secondary | ICD-10-CM

## 2021-01-27 MED ORDER — LISINOPRIL 20 MG PO TABS: 40.0000 mg | ORAL_TABLET | Freq: Two times a day (BID) | ORAL | 1 refills | Status: DC

## 2021-01-28 ENCOUNTER — Telehealth: Payer: Self-pay | Admitting: Hematology and Oncology

## 2021-01-28 DIAGNOSIS — M47812 Spondylosis without myelopathy or radiculopathy, cervical region: Secondary | ICD-10-CM | POA: Diagnosis not present

## 2021-01-28 NOTE — Telephone Encounter (Signed)
Telephoned patient INR therapeutic at 2.2. Will continue Coumadin 3mg  Tues, Sat and 5mg  rest of days. Repeat INR in 2 weeks. Patient verbalized understanding.

## 2021-01-29 ENCOUNTER — Telehealth: Payer: Self-pay | Admitting: Nurse Practitioner

## 2021-01-29 DIAGNOSIS — M25511 Pain in right shoulder: Secondary | ICD-10-CM | POA: Diagnosis not present

## 2021-01-29 DIAGNOSIS — M19011 Primary osteoarthritis, right shoulder: Secondary | ICD-10-CM | POA: Diagnosis not present

## 2021-01-29 NOTE — Telephone Encounter (Signed)
Called patient.  She received steroid injections (3 of them).  She was advised to increase her Toujeo to 35 units SQ nightly (temporarily until her glucose levels back off) and keep her Novolog SSI the same.  She is instructed to call me back if blood glucose still remains high after a few days taking this new dose.

## 2021-01-29 NOTE — Telephone Encounter (Signed)
Pt is calling in regards to readings.  5/24 131, 120, after supper 217, 131 bed  5/25 164, 136 after lunch, 142, 84 bed  5/26 108, after lunch 227, 378 after supper, 508 bed  5/27 358 at 432am, 642am 229   Pt is requesting a call back from [place]

## 2021-02-02 ENCOUNTER — Other Ambulatory Visit: Payer: Self-pay | Admitting: Oncology

## 2021-02-02 DIAGNOSIS — N189 Chronic kidney disease, unspecified: Secondary | ICD-10-CM

## 2021-02-02 DIAGNOSIS — D631 Anemia in chronic kidney disease: Secondary | ICD-10-CM

## 2021-02-02 NOTE — Progress Notes (Signed)
[facility]  [address] [place],  [place]  [address] [phone]  Clinic Day:  02/03/2021  Referring physician: [name], MD  This document serves as a record of services personally performed by Marice Potter, MD. It was created on their behalf by [facility], a trained medical scribe. The creation of this record is based on the scribe's personal observations and the provider's statements to them.  HISTORY OF PRESENT ILLNESS:  Pamela [name] is a 75 y.o. female with mild anemia.  However, her last few CBCs have shown a normal hemoglobin to where she has been followed conservatively.  She also has protein S deficiency for which she is on a home Coumadin program which assesses her INR level.  She comes in today for routine follow-up.  Since her last visit, the patient has been doing fairly well.  She denies having increased fatigue or any overt forms of blood loss which concern her for progressive anemia.    PHYSICAL EXAM: VITALS:  Blood pressure (!) 207/79, pulse 70, temperature 98.1 F (36.7 C), resp. rate 16, height 5\' 4"  (1.626 m), weight 184 lb 1.6 oz (83.5 kg), SpO2 97 %.  Wt Readings from Last 3 Encounters:  02/03/21 184 lb 1.6 oz (83.5 kg)  01/25/21 185 lb 3.2 oz (84 kg)  01/22/21 185 lb (83.9 kg)    Body mass index is 31.6 kg/m.  Performance status (ECOG): 0 - Asymptomatic Physical Exam Constitutional:      Appearance: Normal appearance. She is not ill-appearing.  HENT:     Mouth/Throat:     Mouth: Mucous membranes are moist.     Pharynx: Oropharynx is clear. No oropharyngeal exudate or posterior oropharyngeal erythema.  Cardiovascular:     Rate and Rhythm: Normal rate and regular rhythm.     Heart sounds: No murmur heard. No friction rub. No gallop.   Pulmonary:     Effort: Pulmonary effort is normal. No respiratory distress.     Breath sounds: Normal breath sounds. No wheezing, rhonchi or rales.  Chest:   Breasts:     Right: No axillary adenopathy or supraclavicular adenopathy.     Left: No axillary adenopathy or supraclavicular adenopathy.    Abdominal:     General: Bowel sounds are normal. There is no distension.     Palpations: Abdomen is soft. There is no mass.     Tenderness: There is no abdominal tenderness.  Musculoskeletal:        General: No swelling.     Right lower leg: No edema.     Left lower leg: No edema.  Lymphadenopathy:     Cervical: No cervical adenopathy.     Upper Body:     Right upper body: No supraclavicular or axillary adenopathy.     Left upper body: No supraclavicular or axillary adenopathy.     Lower Body: No right inguinal adenopathy. No left inguinal adenopathy.  Skin:    General: Skin is warm.     Coloration: Skin is not jaundiced.     Findings: No lesion or rash.  Neurological:     General: No focal deficit present.     Mental Status: She is alert and oriented to person, place, and time. Mental status is at baseline.     Cranial Nerves: Cranial nerves are intact.  Psychiatric:        Mood and Affect: Mood normal.        Behavior: Behavior normal.  Thought Content: Thought content normal.     LABS:   CBC Latest Ref Rng & Units 02/03/2021 11/12/2020 09/11/2020  WBC - 5.1 8.7 6.5  Hemoglobin 12.0 - 16.0 12.5 13.2 12.7  Hematocrit 36 - 46 37 40.4 37.6  Platelets 150 - 399 206 279 247   CMP Latest Ref Rng & Units 02/03/2021 12/09/2020 11/12/2020  Glucose 65 - 99 mg/dL - 129(H) 240(H)  BUN 4 - 21 43(A) 20 36(H)  Creatinine 0.5 - 1.1 1.2(A) 1.01(H) 1.26(H)  Sodium 137 - 147 138 135 134  Potassium 3.4 - 5.3 4.3 5.1 4.6  Chloride 99 - 108 103 97 93(L)  CO2 13 - 22 30(A) 23 26  Calcium 8.7 - 10.7 8.5(A) 9.1 9.8  Total Protein 6.0 - 8.5 g/dL - 6.2 6.9  Total Bilirubin 0.0 - 1.2 mg/dL - 0.4 0.6  Alkaline Phos 25 - 125 60 73 98  AST 13 - 35 37(A) 23 28  ALT 7 - 35 41(A) 22 65(H)     ASSESSMENT & PLAN:  A 75 year old woman who I recently began  seeing for mild anemia, for which a component appears to be due to renal insufficiency.  I am pleased as her hemoglobin of 12.5 today is normal. Our office will continue to work with her home Coumadin program to ensure her INR levels are being followed, with her Coumadin being adjusted accordingly.  As she is doing well from a hematologic standpoint, I will see this patient back in 1 year for repeat clinical assessment.  The patient understands all the plans discussed today and is in agreement with them.   I, Rita Ohara, am acting as scribe for Marice Potter, MD    I have reviewed this report as typed by the medical scribe, and it is complete and accurate.  Dequincy Macarthur Critchley, MD

## 2021-02-03 ENCOUNTER — Inpatient Hospital Stay: Payer: TRICARE For Life (TFL)

## 2021-02-03 ENCOUNTER — Inpatient Hospital Stay: Payer: TRICARE For Life (TFL) | Attending: Oncology | Admitting: Oncology

## 2021-02-03 ENCOUNTER — Encounter: Payer: Self-pay | Admitting: Oncology

## 2021-02-03 ENCOUNTER — Other Ambulatory Visit: Payer: Self-pay | Admitting: Oncology

## 2021-02-03 ENCOUNTER — Telehealth: Payer: Self-pay | Admitting: Oncology

## 2021-02-03 ENCOUNTER — Other Ambulatory Visit: Payer: Self-pay

## 2021-02-03 VITALS — BP 207/79 | HR 70 | Temp 98.1°F | Resp 16 | Ht 64.0 in | Wt 184.1 lb

## 2021-02-03 DIAGNOSIS — D6859 Other primary thrombophilia: Secondary | ICD-10-CM

## 2021-02-03 DIAGNOSIS — D631 Anemia in chronic kidney disease: Secondary | ICD-10-CM | POA: Insufficient documentation

## 2021-02-03 DIAGNOSIS — N189 Chronic kidney disease, unspecified: Secondary | ICD-10-CM | POA: Diagnosis not present

## 2021-02-03 DIAGNOSIS — N390 Urinary tract infection, site not specified: Secondary | ICD-10-CM | POA: Diagnosis not present

## 2021-02-03 DIAGNOSIS — N3946 Mixed incontinence: Secondary | ICD-10-CM | POA: Diagnosis not present

## 2021-02-03 DIAGNOSIS — N952 Postmenopausal atrophic vaginitis: Secondary | ICD-10-CM | POA: Diagnosis not present

## 2021-02-03 DIAGNOSIS — D649 Anemia, unspecified: Secondary | ICD-10-CM | POA: Diagnosis not present

## 2021-02-03 LAB — BASIC METABOLIC PANEL
BUN: 43 — AB (ref 4–21)
CO2: 30 — AB (ref 13–22)
Chloride: 103 (ref 99–108)
Creatinine: 1.2 — AB (ref 0.5–1.1)
Glucose: 60
Potassium: 4.3 (ref 3.4–5.3)
Sodium: 138 (ref 137–147)

## 2021-02-03 LAB — HEPATIC FUNCTION PANEL
ALT: 41 — AB (ref 7–35)
AST: 37 — AB (ref 13–35)
Alkaline Phosphatase: 60 (ref 25–125)
Bilirubin, Total: 0.4

## 2021-02-03 LAB — COMPREHENSIVE METABOLIC PANEL
Albumin: 3.8 (ref 3.5–5.0)
Calcium: 8.5 — AB (ref 8.7–10.7)

## 2021-02-03 LAB — CBC AND DIFFERENTIAL
HCT: 37 (ref 36–46)
Hemoglobin: 12.5 (ref 12.0–16.0)
Neutrophils Absolute: 3.11
Platelets: 206 (ref 150–399)
WBC: 5.1

## 2021-02-03 LAB — CBC: RBC: 3.95 (ref 3.87–5.11)

## 2021-02-03 NOTE — Telephone Encounter (Signed)
Per 6/1 LOS, patient scheduled for June 2023 Appt's.  Gave patient Appt Summary

## 2021-02-04 ENCOUNTER — Ambulatory Visit (INDEPENDENT_AMBULATORY_CARE_PROVIDER_SITE_OTHER): Payer: Medicare PPO

## 2021-02-04 ENCOUNTER — Other Ambulatory Visit: Payer: Self-pay | Admitting: Family Medicine

## 2021-02-04 DIAGNOSIS — Z1231 Encounter for screening mammogram for malignant neoplasm of breast: Secondary | ICD-10-CM

## 2021-02-04 DIAGNOSIS — Z23 Encounter for immunization: Secondary | ICD-10-CM

## 2021-02-05 DIAGNOSIS — M25572 Pain in left ankle and joints of left foot: Secondary | ICD-10-CM | POA: Diagnosis not present

## 2021-02-08 ENCOUNTER — Encounter: Payer: Self-pay | Admitting: Nurse Practitioner

## 2021-02-08 ENCOUNTER — Other Ambulatory Visit: Payer: Self-pay

## 2021-02-08 ENCOUNTER — Ambulatory Visit (INDEPENDENT_AMBULATORY_CARE_PROVIDER_SITE_OTHER): Payer: Medicare PPO | Admitting: Nurse Practitioner

## 2021-02-08 VITALS — BP 164/72 | HR 61 | Ht 64.0 in | Wt 186.0 lb

## 2021-02-08 DIAGNOSIS — Z794 Long term (current) use of insulin: Secondary | ICD-10-CM

## 2021-02-08 DIAGNOSIS — N1831 Chronic kidney disease, stage 3a: Secondary | ICD-10-CM | POA: Diagnosis not present

## 2021-02-08 DIAGNOSIS — E1122 Type 2 diabetes mellitus with diabetic chronic kidney disease: Secondary | ICD-10-CM | POA: Diagnosis not present

## 2021-02-08 DIAGNOSIS — E782 Mixed hyperlipidemia: Secondary | ICD-10-CM | POA: Diagnosis not present

## 2021-02-08 DIAGNOSIS — I10 Essential (primary) hypertension: Secondary | ICD-10-CM

## 2021-02-08 LAB — POCT INR

## 2021-02-08 MED ORDER — INSULIN GLARGINE (1 UNIT DIAL) 300 UNIT/ML ~~LOC~~ SOPN: 20.0000 [IU] | PEN_INJECTOR | Freq: Every day | SUBCUTANEOUS | 1 refills | Status: DC

## 2021-02-08 NOTE — Patient Instructions (Signed)

## 2021-02-08 NOTE — Progress Notes (Signed)
Endocrinology Consult Note       02/08/2021, 11:09 AM   Subjective:    Patient ID: Pamela [name], female    DOB: May 04, 1946.  Pamela [name] is being seen in consultation for management of currently uncontrolled symptomatic diabetes requested by  [name], MD.   Past Medical History:  Diagnosis Date  . Anemia   . Asthma   . Atrial fibrillation [I48.91] 10/01/2014  . Bilateral leg cramps   . Chronic anticoagulation   . Chronic constipation   . Closed bimalleolar fracture of left ankle 02/26/2020  . Complication of anesthesia   . Deep venous thrombosis ([place]) 07/30/2013  . Diabetes mellitus   . Dizziness 05/23/2011  . DJD (degenerative joint disease)   . DOE (dyspnea on exertion) 03/03/2013   Onset early 2020  - 02/05/2019   Walked RA  2 laps @  approx 271ft each @ nl pace  stopped due to  Light headed, no sp or sob, no desats    . DVT (deep venous thrombosis) ([place]) 07/30/2013  . Dyspnea    occasionally  . Dyspnea on exertion 03/03/2013   Onset early 2020  - 02/05/2019   Walked RA  2 laps @  approx 269ft each @ nl pace  stopped due to  Light headed, no sp or sob, no desats    . Encounter for orthopedic follow-up care 03/10/2020  . GERD (gastroesophageal reflux disease)   . Heart murmur    as a child  . HTN (hypertension)   . Hypercholesteremia   . Hypertension 05/23/2011  . Hyponatremia 02/06/2019   Na 126 on no obvious meds to cause it with last na 133 in 2014   . Long term (current) use of anticoagulants 03/03/2013  . Low back pain 07/06/2018  . Low back strain 08/09/2018  . Neck pain 06/11/2020  . Obesity   . Osteoarthritis of right knee 07/06/2018  . Pain in right knee 07/06/2018  . Pneumonia   . PONV (postoperative nausea and vomiting)    needs pre-medication  . Protein S deficiency ([place])    prior DVT L knee, L arm DVT,  followed by Dr West Pugh at [facility]  . PVC's (premature  ventricular contractions)   . S/P cardiac catheterization 02/2013   Normal coronaries; low normal EF at 50%  . Sleep apnea    can't use the cpap (diagnosed 2017)  . Solitary pulmonary nodule on lung CT 02/06/2019   First noted 01/13/2014 > no change as of 12/21/2018  . Spondylolisthesis, lumbar region 08/09/2018  . Stroke [facility])    TIA in 2018 or 2019  . TIA (transient ischemic attack)   . Transient ischemic attack 07/30/2013  . Ventricular premature beats 05/23/2011    Past Surgical History:  Procedure Laterality Date  . CARPAL TUNNEL RELEASE  1994   bilateral  . CHOLECYSTECTOMY  1993  . COLONOSCOPY    . FOOT TENDON SURGERY Right   . left knee replacement  2006  . ORIF ANKLE FRACTURE Left 02/26/2020   Procedure: OPEN REDUCTION INTERNAL FIXATION (ORIF) ANKLE FRACTURE;  Surgeon: Nicholes Stairs, MD;  Location: [place];  Service: Orthopedics;  Laterality: Left;  90 mins  . thumb surgery Left 2018  . TONSILLECTOMY    . TOTAL ABDOMINAL HYSTERECTOMY  1988    Social History   Socioeconomic History  . Marital status: Married    Spouse name: [name]  . Number of children: Not on file  . Years of education: Not on file  . Highest education level: Not on file  Occupational History  . Not on file  Tobacco Use  . Smoking status: Never Smoker  . Smokeless tobacco: Never Used  Vaping Use  . Vaping Use: Never used  Substance and Sexual Activity  . Alcohol use: No  . Drug use: No  . Sexual activity: Not on file  Other Topics Concern  . Not on file  Social History Narrative   Lives with spouse in [place].  2 grown daughters.   Retired Glass blower/designer     Social Determinants of Radio broadcast assistant Strain: Not on file  Food Insecurity: No Food Insecurity  . Worried About Charity fundraiser in the Last Year: Never true  . Ran Out of Food in the Last Year: Never true  Transportation Needs: No Transportation Needs  . Lack of Transportation (Medical): No  . Lack of  Transportation (Non-Medical): No  Physical Activity: Not on file  Stress: Not on file  Social Connections: Not on file    Family History  Problem Relation Age of Onset  . Cirrhosis Mother   . Antithrombin III deficiency Mother        multiple emboli  . Ulcerative colitis Mother   . Coronary artery disease Father   . Heart attack Father   . Hypertension Father   . Protein S deficiency Sister        prior DVT  . Protein S deficiency Daughter   . Hypertension Sister   . Hypertension Brother   . Lung cancer Brother   . Heart attack Brother   . Heart attack Sister        age 57   . Stroke Neg Hx   . Colitis Neg Hx   . Colon polyps Neg Hx   . Esophageal cancer Neg Hx   . Liver cancer Neg Hx   . Pancreatic cancer Neg Hx   . Rectal cancer Neg Hx   . Stomach cancer Neg Hx     Outpatient Encounter Medications as of 02/08/2021  Medication Sig  . acetaminophen (TYLENOL) 500 MG tablet Take 500-1,000 mg by mouth every 6 (six) hours as needed (for pain.).  Marland Kitchen albuterol (VENTOLIN HFA) 108 (90 Base) MCG/ACT inhaler Inhale 1-2 puffs into the lungs every 6 (six) hours as needed for wheezing or shortness of breath.  Marland Kitchen aspirin EC 81 MG tablet Take 1 tablet (81 mg total) by mouth daily.  Marland Kitchen atorvastatin (LIPITOR) 40 MG tablet Take 1 tablet (40 mg total) by mouth daily at 12 noon.  Marland Kitchen azelastine (ASTELIN) 137 MCG/SPRAY nasal spray Place 2 sprays into the nose daily as needed for rhinitis or allergies.  . B Complex-C (B-COMPLEX WITH VITAMIN C) tablet Take 1 tablet by mouth daily with lunch.  . Cholecalciferol (VITAMIN D3) 50 MCG (2000 UT) TABS Take 2,000 Units by mouth daily with lunch.   . Continuous Blood Gluc Receiver (FREESTYLE LIBRE 2 READER) DEVI 1 each by Does not apply route 4 (four) times daily - after meals and at bedtime. E11.40  . Continuous Blood Gluc Sensor (FREESTYLE LIBRE 2 SENSOR) MISC 2 each by Does not apply route  every 14 (fourteen) days. E11.40  . denosumab (PROLIA) 60 MG/ML SOSY  injection Inject 60 mg into the skin every 6 (six) months.  Marland Kitchen estradiol (ESTRACE) 0.1 MG/GM vaginal cream Uses twice weekly  . ferrous sulfate 325 (65 FE) MG tablet Take 325 mg by mouth daily.  . furosemide (LASIX) 20 MG tablet Take 1 tablet (20 mg total) by mouth daily.  . insulin aspart (NOVOLOG FLEXPEN) 100 UNIT/ML FlexPen Inject 4-7 Units into the skin 3 (three) times daily with meals. If glucose above 90 and eating.  Follow SSI  . insulin glargine, 1 Unit Dial, (TOUJEO) 300 UNIT/ML Solostar Pen Inject 20 Units into the skin at bedtime.  . Insulin Pen Needle (BD PEN NEEDLE NANO 2ND GEN) 32G X 4 MM MISC USE DAILY AS DIRECTED  . ipratropium-albuterol (DUONEB) 0.5-2.5 (3) MG/3ML SOLN Take 3 mLs by nebulization 4 (four) times daily as needed (wheezing/shortness of breath).  Marland Kitchen lisinopril (ZESTRIL) 20 MG tablet Take 2 tablets (40 mg total) by mouth 2 (two) times daily.  . Magnesium Oxide 400 (240 Mg) MG TABS TAKE 2 TABLETS(800 MG) BY MOUTH TWICE DAILY  . Menthol-Methyl Salicylate (SALONPAS PAIN RELIEF PATCH) PTCH Apply 1 patch topically daily as needed (pain.).  Marland Kitchen methocarbamol (ROBAXIN) 500 MG tablet Take 500 mg by mouth every 8 (eight) hours as needed for muscle spasms.  . ondansetron (ZOFRAN-ODT) 8 MG disintegrating tablet Take 8 mg by mouth every 8 (eight) hours as needed for nausea or vomiting.   Marland Kitchen oxyCODONE (OXY IR/ROXICODONE) 5 MG immediate release tablet Take 1 tablet (5 mg total) by mouth every 6 (six) hours as needed for severe pain.  . pantoprazole (PROTONIX) 40 MG tablet Take 1 tablet (40 mg total) by mouth every morning.  . polyethylene glycol (MIRALAX / GLYCOLAX) 17 g packet Take 17 g by mouth as needed. Uses 1-2 times weekly  . pregabalin (LYRICA) 25 MG capsule Take 1 capsule (25 mg total) by mouth 3 (three) times daily.  Marland Kitchen senna-docusate (SENOKOT-S) 8.6-50 MG tablet Take 1 tablet by mouth at bedtime.  . sodium chloride (OCEAN) 0.65 % SOLN nasal spray Place 1-2 sprays into both  nostrils 4 (four) times daily as needed for congestion.  . sulfamethoxazole-trimethoprim (BACTRIM DS) 800-160 MG tablet Take 1 tablet by mouth 2 (two) times daily.  . Vibegron (GEMTESA PO) Take by mouth at bedtime.  Marland Kitchen warfarin (COUMADIN) 1 MG tablet Take 1 tablet (1 mg total) by mouth See admin instructions. Take as directed (Patient taking differently: Take 3 mg by mouth See admin instructions. Mon-Wed-Fri)  . warfarin (COUMADIN) 5 MG tablet TAKE 1 TABLET BY MOUTH EVERY DAY  . [DISCONTINUED] insulin glargine, 1 Unit Dial, (TOUJEO) 300 UNIT/ML Solostar Pen Inject 20 Units into the skin at bedtime. (Patient taking differently: Inject 25 Units into the skin at bedtime.)   No facility-administered encounter medications on file as of 02/08/2021.    ALLERGIES: Allergies  Allergen Reactions  . Ketek [Telithromycin] Nausea And Vomiting and Rash  . Loxapine Succinate Hives  . Naproxen Sodium Anaphylaxis and Hives  . Amoxapine And Related Hives  . Darvon Nausea And Vomiting  . Belsomra [Suvorexant]     "Nightmares"  . Cymbalta [Duloxetine Hcl]     "Blackouts"  . Gabapentin Other (See Comments)    Blurry vision  . Amoxicillin Rash  . Propoxyphene Nausea And Vomiting    VACCINATION STATUS: Immunization History  Administered Date(s) Administered  . Fluad Quad(high Dose 65+) 05/26/2020  . Influenza,inj,Quad PF,6+ Mos  06/04/2013  . PFIZER Comirnaty(Gray Top)Covid-19 Tri-Sucrose Vaccine 02/04/2021  . PFIZER(Purple Top)SARS-COV-2 Vaccination 09/22/2019, 10/12/2019, 06/16/2020  . Pneumococcal Conjugate-13 07/30/2014  . Pneumococcal Polysaccharide-23 03/31/2008  . Tdap 07/25/2011  . Zoster, Live 03/31/2008, 06/03/2011    Diabetes She presents for her follow-up diabetic visit. She has type 2 diabetes mellitus. Onset time: diagnosed at approx age of 49. Her disease course has been improving. Hypoglycemia symptoms include nervousness/anxiousness, sweats and tremors. Associated symptoms include  foot paresthesias. Hypoglycemia complications include nocturnal hypoglycemia. Symptoms are stable. Diabetic complications include a CVA, heart disease, nephropathy and peripheral neuropathy. Risk factors for coronary artery disease include diabetes mellitus, dyslipidemia, family history, hypertension, post-menopausal and sedentary lifestyle. Current diabetic treatment includes intensive insulin program. She is compliant with treatment most of the time. Her weight is fluctuating minimally. She is following a generally healthy diet. When asked about meal planning, she reported none. She has not had a previous visit with a dietitian. She rarely participates in exercise. Her home blood glucose trend is decreasing steadily. (She presents today with her logs, no meter, showing improving glycemic profile with mild random hypoglycemia.  In between visits, she received 3 steroid injections and her glucose shot up requiring her to increase her Toujeo temporarily to 35 units, since then it has come back to normal and she has reduced her dose back to 25 units nightly.) An ACE inhibitor/angiotensin II receptor blocker is being taken. She does not see a podiatrist.Eye exam is current.  Hyperlipidemia This is a chronic problem. The current episode started more than 1 year ago. The problem is controlled. Recent lipid tests were reviewed and are normal. Exacerbating diseases include chronic renal disease and diabetes. Factors aggravating her hyperlipidemia include beta blockers and fatty foods. Current antihyperlipidemic treatment includes statins. The current treatment provides significant improvement of lipids. Compliance problems include adherence to exercise.  Risk factors for coronary artery disease include diabetes mellitus, dyslipidemia, hypertension, post-menopausal and a sedentary lifestyle.  Hypertension This is a chronic problem. The current episode started more than 1 year ago. The problem has been resolved since  onset. The problem is controlled. Associated symptoms include sweats. There are no associated agents to hypertension. Risk factors for coronary artery disease include diabetes mellitus, dyslipidemia, family history, post-menopausal state and sedentary lifestyle. Past treatments include beta blockers, diuretics and ACE inhibitors. The current treatment provides moderate improvement. There are no compliance problems.  Hypertensive end-organ damage includes kidney disease, CVA and heart failure. Identifiable causes of hypertension include chronic renal disease and sleep apnea.     Review of systems  Constitutional: + Minimally fluctuating body weight,  current Body mass index is 31.93 kg/m. , no fatigue, no subjective hyperthermia, no subjective hypothermia Eyes: no blurry vision, no xerophthalmia ENT: no sore throat, no nodules palpated in throat, no dysphagia/odynophagia, no hoarseness Cardiovascular: no chest pain, no shortness of breath, no palpitations, no leg swelling Respiratory: no cough, no shortness of breath Gastrointestinal: no nausea/vomiting/diarrhea Musculoskeletal: no muscle/joint aches Skin: no rashes, no hyperemia Neurological: no tremors, no numbness, no tingling, no dizziness Psychiatric: no depression, no anxiety    Objective:     BP (!) 164/72   Pulse 61   Ht 5\' 4"  (1.626 m)   Wt 186 lb (84.4 kg)   BMI 31.93 kg/m   Wt Readings from Last 3 Encounters:  02/08/21 186 lb (84.4 kg)  02/03/21 184 lb 1.6 oz (83.5 kg)  01/25/21 185 lb 3.2 oz (84 kg)     BP Readings from  Last 3 Encounters:  02/08/21 (!) 164/72  02/03/21 (!) 207/79  01/25/21 102/60      Physical Exam- Limited  Constitutional:  Body mass index is 31.93 kg/m. , not in acute distress, normal state of mind Eyes:  EOMI, no exophthalmos Neck: Supple Cardiovascular: RRR, no murmurs, rubs, or gallops, no edema Respiratory: Adequate breathing efforts, no crackles, rales, rhonchi, or  wheezing Musculoskeletal: no gross deformities, strength intact in all four extremities, no gross restriction of joint movements Skin:  no rashes, no hyperemia Neurological: no tremor with outstretched hands    POCT ABI Results 02/08/21   Right ABI:  1.12      Left ABI:  1.16  Right leg systolic / diastolic: 572/62 mmHg Left leg systolic / diastolic: 035/59 mmHg  Arm systolic / diastolic: 741/63 mmHG  Detailed report will be scanned into patient chart.    CMP ( most recent) CMP     Component Value Date/Time   NA 138 02/03/2021 0000   K 4.3 02/03/2021 0000   CL 103 02/03/2021 0000   CO2 30 (A) 02/03/2021 0000   GLUCOSE 129 (H) 12/09/2020 0758   GLUCOSE 132 (H) 03/10/2020 1646   BUN 43 (A) 02/03/2021 0000   CREATININE 1.2 (A) 02/03/2021 0000   CREATININE 1.01 (H) 12/09/2020 0758   CALCIUM 8.5 (A) 02/03/2021 0000   PROT 6.2 12/09/2020 0758   ALBUMIN 3.8 02/03/2021 0000   ALBUMIN 3.9 12/09/2020 0758   AST 37 (A) 02/03/2021 0000   ALT 41 (A) 02/03/2021 0000   ALKPHOS 60 02/03/2021 0000   BILITOT 0.4 12/09/2020 0758   GFRNONAA 61 09/11/2020 1123   GFRAA 70 09/11/2020 1123     Diabetic Labs (most recent): Lab Results  Component Value Date   HGBA1C 9.8 (H) 11/12/2020   HGBA1C 8.6 (H) 08/13/2020   HGBA1C 7.4 (H) 02/26/2020     Lipid Panel ( most recent) Lipid Panel     Component Value Date/Time   CHOL 135 11/12/2020 1032   TRIG 96 11/12/2020 1032   HDL 49 11/12/2020 1032   CHOLHDL 2.8 11/12/2020 1032   LDLCALC 68 11/12/2020 1032   LABVLDL 18 11/12/2020 1032      Lab Results  Component Value Date   TSH 2.020 11/14/2019   TSH 1.24 02/05/2019   FREET4 1.23 01/21/2013           Assessment & Plan:   1) Type 2 diabetes mellitus with stage 3b chronic kidney disease, with long-term current use of insulin ([place])  She presents today with her logs, no meter, showing improving glycemic profile with mild random hypoglycemia.  In between visits, she received  3 steroid injections and her glucose shot up requiring her to increase her Toujeo temporarily to 35 units, since then it has come back to normal and she has reduced her dose back to 25 units nightly.  - Pamela [name] has currently uncontrolled symptomatic type 2 DM since 75 years of age, with most recent A1c of 9.8 %.   -Recent labs reviewed.  - I had a long discussion with her about the progressive nature of diabetes and the pathology behind its complications. -her diabetes is complicated by CKD stage 3, CVA and she remains at a high risk for more acute and chronic complications which include CAD, CVA, CKD, retinopathy, and neuropathy. These are all discussed in detail with her.  - Nutritional counseling repeated at each appointment due to patients tendency to fall back in to old habits.  -  The patient admits there is a room for improvement in their diet and drink choices. -  Suggestion is made for the patient to avoid simple carbohydrates from their diet including Cakes, Sweet Desserts / Pastries, Ice Cream, Soda (diet and regular), Sweet Tea, Candies, Chips, Cookies, Sweet Pastries, Store Bought Juices, Alcohol in Excess of 1-2 drinks a day, Artificial Sweeteners, Coffee Creamer, and "Sugar-free" Products. This will help patient to have stable blood glucose profile and potentially avoid unintended weight gain.   - I encouraged the patient to switch to unprocessed or minimally processed complex starch and increased protein intake (animal or plant source), fruits, and vegetables.   - Patient is advised to stick to a routine mealtimes to eat 3 meals a day and avoid unnecessary snacks (to snack only to correct hypoglycemia).  - I have approached her with the following individualized plan to manage  her diabetes and patient agrees:   -Based on her fasting hypoglycemia, she is advised to lower her dose of Toujeo to 20 units SQ nightly and continue her Novolog 4-7 units TID with meals  if glucose is above 90 and she is eating.  Specific instructions on how to titrate insulin dose based on glucose readings given to patient in writing.    -she is encouraged to continue monitoring glucose 4 times daily using her CGM, before meals and before bed, and to call the clinic if she has readings less than 70 or greater than 200 for 3 tests in a row.  - she is warned not to take insulin without proper monitoring per orders. - Adjustment parameters are given to her for hypo and hyperglycemia in writing.  - Specific targets for  A1c;  LDL, HDL,  and Triglycerides were discussed with the patient.  2) Blood Pressure /Hypertension:  her blood pressure is controlled to target.   she is advised to continue her current medications including Lasix 20 mg po daily, Lisinopril 40 mg po twice daily.  3) Lipids/Hyperlipidemia:    Review of her recent lipid panel from 11/12/20 showed controlled LDL at 68 .  she  is advised to continue Lipitor 40 mg daily at bedtime.  Side effects and precautions discussed with her.  4)  Weight/Diet:  her Body mass index is 31.93 kg/m.  -   she is not a candidate for major weight loss.  Exercise, and detailed carbohydrates information provided  -  detailed on discharge instructions.  5) Chronic Care/Health Maintenance: -she is on ACEI/ARB and Statin medications and is encouraged to initiate and continue to follow up with Ophthalmology, Dentist, Podiatrist at least yearly or according to recommendations, and advised to stay away from smoking. I have recommended yearly flu vaccine and pneumonia vaccine at least every 5 years; moderate intensity exercise for up to 150 minutes weekly; and sleep for at least 7 hours a day.  - she is advised to maintain close follow up with [facility], Elnita Maxwell, MD for primary care needs, as well as her other providers for optimal and coordinated care.     I spent 47 minutes in the care of the patient today including review of labs from [facility],  Lipids, Thyroid Function, Hematology (current and previous including abstractions from other facilities); face-to-face time discussing  her blood glucose readings/logs, discussing hypoglycemia and hyperglycemia episodes and symptoms, medications doses, her options of short and long term treatment based on the latest standards of care / guidelines;  discussion about incorporating lifestyle medicine;  and documenting the encounter.  Please refer to Patient Instructions for Blood Glucose Monitoring and Insulin/Medications Dosing Guide"  in media tab for additional information. Please  also refer to " Patient Self Inventory" in the Media  tab for reviewed elements of pertinent patient history.  Yennifer Preslei Blakley participated in the discussions, expressed understanding, and voiced agreement with the above plans.  All questions were answered to her satisfaction. she is encouraged to contact clinic should she have any questions or concerns prior to her return visit.   Follow up plan: - Return in about 3 months (around 05/11/2021) for Diabetes F/U with A1c in office, No previsit labs, Bring meter and logs.  Rayetta Pigg, [facility] [address] [place], [place] [address] Phone: [phone] Fax: [phone]  02/08/2021, 11:09 AM

## 2021-02-12 ENCOUNTER — Other Ambulatory Visit: Payer: Self-pay

## 2021-02-12 DIAGNOSIS — Z7901 Long term (current) use of anticoagulants: Secondary | ICD-10-CM

## 2021-02-12 DIAGNOSIS — D6859 Other primary thrombophilia: Secondary | ICD-10-CM

## 2021-02-12 MED ORDER — WARFARIN SODIUM 1 MG PO TABS: 1.0000 mg | ORAL_TABLET | ORAL | 2 refills | Status: DC

## 2021-02-14 ENCOUNTER — Other Ambulatory Visit: Payer: Self-pay | Admitting: Family Medicine

## 2021-02-15 ENCOUNTER — Ambulatory Visit: Payer: Medicare PPO | Admitting: Family Medicine

## 2021-02-18 DIAGNOSIS — M5136 Other intervertebral disc degeneration, lumbar region: Secondary | ICD-10-CM | POA: Diagnosis not present

## 2021-02-18 DIAGNOSIS — E1342 Other specified diabetes mellitus with diabetic polyneuropathy: Secondary | ICD-10-CM | POA: Diagnosis not present

## 2021-02-18 DIAGNOSIS — M542 Cervicalgia: Secondary | ICD-10-CM | POA: Diagnosis not present

## 2021-02-23 ENCOUNTER — Telehealth: Payer: Self-pay | Admitting: Hematology and Oncology

## 2021-02-23 NOTE — Telephone Encounter (Signed)
Spoke with patient regarding INR 1.9 It has fluctuated around 2, but would prefer INR to be 2 or above. Will have her increase the Coumadin to 5mg  on Tues and continue 3mg  Sat and 5mg  rest of days. Will repeat INR in 1 week. Patient verbalized understanding.

## 2021-02-24 ENCOUNTER — Telehealth: Payer: Self-pay | Admitting: Nurse Practitioner

## 2021-02-24 DIAGNOSIS — E1140 Type 2 diabetes mellitus with diabetic neuropathy, unspecified: Secondary | ICD-10-CM

## 2021-02-24 MED ORDER — NOVOLOG FLEXPEN 100 UNIT/ML ~~LOC~~ SOPN: 4.0000 [IU] | PEN_INJECTOR | Freq: Three times a day (TID) | SUBCUTANEOUS | 0 refills | Status: DC

## 2021-02-24 NOTE — Telephone Encounter (Signed)
Rx sent 

## 2021-02-24 NOTE — Telephone Encounter (Signed)
Pt is calling and requesting a refill  insulin aspart (NOVOLOG FLEXPEN) 100 UNIT/ML FlexPen    [facility] [id number]  [place], [place] [address]  Phone:  [phone]  Fax:  [phone]

## 2021-02-26 ENCOUNTER — Telehealth: Payer: Self-pay

## 2021-02-26 NOTE — Progress Notes (Signed)
Chronic Care Management Pharmacy Assistant   Name: Darlen Gledhill  MRN: [id number] DOB: November 03, 1945  Reason for Encounter: Medication Review for Tumeric   Medications: Outpatient Encounter Medications as of 02/26/2021  Medication Sig Note   acetaminophen (TYLENOL) 500 MG tablet Take 500-1,000 mg by mouth every 6 (six) hours as needed (for pain.).    albuterol (VENTOLIN HFA) 108 (90 Base) MCG/ACT inhaler Inhale 1-2 puffs into the lungs every 6 (six) hours as needed for wheezing or shortness of breath.    aspirin EC 81 MG tablet Take 1 tablet (81 mg total) by mouth daily.    atorvastatin (LIPITOR) 40 MG tablet Take 1 tablet (40 mg total) by mouth daily at 12 noon.    azelastine (ASTELIN) 137 MCG/SPRAY nasal spray Place 2 sprays into the nose daily as needed for rhinitis or allergies.    B Complex-C (B-COMPLEX WITH VITAMIN C) tablet Take 1 tablet by mouth daily with lunch.    Cholecalciferol (VITAMIN D3) 50 MCG (2000 UT) TABS Take 2,000 Units by mouth daily with lunch.     Continuous Blood Gluc Receiver (FREESTYLE LIBRE 2 READER) DEVI 1 each by Does not apply route 4 (four) times daily - after meals and at bedtime. E11.40    Continuous Blood Gluc Sensor (FREESTYLE LIBRE 2 SENSOR) MISC 2 each by Does not apply route every 14 (fourteen) days. E11.40    denosumab (PROLIA) 60 MG/ML SOSY injection Inject 60 mg into the skin every 6 (six) months.    estradiol (ESTRACE) 0.1 MG/GM vaginal cream Uses twice weekly    ferrous sulfate 325 (65 FE) MG tablet Take 325 mg by mouth daily.    furosemide (LASIX) 20 MG tablet Take 1 tablet (20 mg total) by mouth daily.    insulin aspart (NOVOLOG FLEXPEN) 100 UNIT/ML FlexPen Inject 4-7 Units into the skin 3 (three) times daily with meals. If glucose above 90 and eating.  Follow SSI    insulin glargine, 1 Unit Dial, (TOUJEO) 300 UNIT/ML Solostar Pen Inject 20 Units into the skin at bedtime.    Insulin Pen Needle (BD PEN NEEDLE NANO 2ND GEN) 32G X 4  MM MISC USE DAILY AS DIRECTED    ipratropium-albuterol (DUONEB) 0.5-2.5 (3) MG/3ML SOLN Take 3 mLs by nebulization 4 (four) times daily as needed (wheezing/shortness of breath).    lisinopril (ZESTRIL) 20 MG tablet Take 2 tablets (40 mg total) by mouth 2 (two) times daily.    magnesium oxide (MAG-OX) 400 (240 Mg) MG tablet TAKE 2 TABLETS(800 MG) BY MOUTH TWICE DAILY    Menthol-Methyl Salicylate (SALONPAS PAIN RELIEF PATCH) PTCH Apply 1 patch topically daily as needed (pain.).    methocarbamol (ROBAXIN) 500 MG tablet Take 500 mg by mouth every 8 (eight) hours as needed for muscle spasms.    ondansetron (ZOFRAN-ODT) 8 MG disintegrating tablet Take 8 mg by mouth every 8 (eight) hours as needed for nausea or vomiting.     oxyCODONE (OXY IR/ROXICODONE) 5 MG immediate release tablet Take 1 tablet (5 mg total) by mouth every 6 (six) hours as needed for severe pain.    pantoprazole (PROTONIX) 40 MG tablet Take 1 tablet (40 mg total) by mouth every morning.    polyethylene glycol (MIRALAX / GLYCOLAX) 17 g packet Take 17 g by mouth as needed. Uses 1-2 times weekly    pregabalin (LYRICA) 25 MG capsule Take 1 capsule (25 mg total) by mouth 3 (three) times daily. 01/12/2021: 75 mg at night per patient  senna-docusate (SENOKOT-S) 8.6-50 MG tablet Take 1 tablet by mouth at bedtime.    sodium chloride (OCEAN) 0.65 % SOLN nasal spray Place 1-2 sprays into both nostrils 4 (four) times daily as needed for congestion.    sulfamethoxazole-trimethoprim (BACTRIM DS) 800-160 MG tablet Take 1 tablet by mouth 2 (two) times daily.    Vibegron (GEMTESA PO) Take by mouth at bedtime.    warfarin (COUMADIN) 1 MG tablet Take 1 tablet (1 mg total) by mouth See admin instructions. Take as directed    warfarin (COUMADIN) 5 MG tablet TAKE 1 TABLET BY MOUTH EVERY DAY    No facility-administered encounter medications on file as of 02/26/2021.   02/25/21- patient called and left a message, she stated she was looking to start tumeric  for some arthritis pain, she stated she did not want to go on any prescription medication.  I notified Donette Larry, CPP to see if this would be ok.  Donette Larry CPP consulted with Dr [name], there is some concern about the interaction with her warfarin, and suggested that we reach out to Dr Bobby Rumpf for further follow up.  02/26/21- I spoke with triage nurse at Dr Bobby Rumpf office, she took the  information for the patient, she said she would check with the doctor and contact me with the result.  I contacted the patient to let her know, I was waiting on Dr. Bobby Rumpf' office to contact me back.   03/03/21-Spoke to patient to see if she had gotten a call from Dr. Bobby Rumpf' office about taking Tumeric, she said he had spoken to Dr. Bobby Rumpf and was given the OK to take it, patient said Dr. Bobby Rumpf said there was no information that would suggest there was any reactions with Warfarin.  Patient stated she has started taking the Tumeric and it has been helping.   She will let us know if she has any questions or concerns.   Clarita Leber, [place] Pharmacist Assistant [phone]

## 2021-03-01 ENCOUNTER — Telehealth: Payer: Self-pay

## 2021-03-01 ENCOUNTER — Other Ambulatory Visit: Payer: Self-pay

## 2021-03-01 ENCOUNTER — Telehealth: Payer: Self-pay | Admitting: Hematology and Oncology

## 2021-03-01 ENCOUNTER — Ambulatory Visit
Admission: RE | Admit: 2021-03-01 | Discharge: 2021-03-01 | Disposition: A | Discharge status: Home / Self Care | Payer: Medicare PPO | Source: Ambulatory Visit | Attending: Family Medicine | Admitting: Family Medicine

## 2021-03-01 DIAGNOSIS — Z1231 Encounter for screening mammogram for malignant neoplasm of breast: Secondary | ICD-10-CM | POA: Diagnosis not present

## 2021-03-01 NOTE — Telephone Encounter (Signed)
Patient's home INR was 1.7 today, so I recommended adjusting Coumadin higher.  She will take 6 mg Mondays, Wednesdays and Fridays and 5 mg the rest of the days.  She is asking if she can take turmeric with the Coumadin and I was unable to find any evidence of an interaction with the Coumadin, so advised her she could try it.  She tells me that she is post have of orthopedic surgery removed the pins in her ankle next week, so will be off for Coumadin.  I asked Dr. Bobby Rumpf whether we need to bridge her and he asked me to discuss with the orthopedic surgeon. Typically, I would repeat her INR next week, but she will likely be off Coumadin for the procedure, so we will plan to repeat this in 2 weeks. The patient verbalizes understanding.

## 2021-03-01 NOTE — Telephone Encounter (Addendum)
I LVM on [facility] general voice mail, notifying them of Kelli,PA, response. I told them to call us back with any questions.  ----- Message from Marvia Pickles, PA-C sent at 03/01/2021 12:45 PM EDT ----- Regarding: RE: Turmeric Contact: [phone] I could not find any information on interaction between turmeric and Coumadin. Thanks ----- Message ----- From: Dairl Ponder, RN Sent: 02/26/2021   8:42 AM EDT To: Marvia Pickles, PA-C Subject: Turmeric                                       Received call from Dr [facility]'s office. Pt called them to ask if it was ok for her to take turmeric for her arthritis. They want our opinion since she is taking Coumadin.

## 2021-03-04 ENCOUNTER — Telehealth: Payer: Self-pay

## 2021-03-04 NOTE — Telephone Encounter (Signed)
Pt.notified

## 2021-03-04 NOTE — Telephone Encounter (Signed)
I pulled her Pamela [name] report.  Looks good.  73% of time is spent within the target range, no hypoglycemia noted.  All looks good from our end.  I recommend staying the course for now.  Have her use a back up method to test her glucose if she is feeling shaky and her Pamela [name] isn't reading low, just to be sure.

## 2021-03-04 NOTE — Telephone Encounter (Signed)
Patient would like you to check her Pamela [name] and advise on her readings. 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

## 2021-03-05 NOTE — Telephone Encounter (Signed)
I spoke with Pamela [name] at Dr. Stann Mainland office.  He is only recommended holding Coumadin for 2 days for this procedure.  We will therefore not plan to bridge the patient.  She will resume Coumadin at her previous dose after the procedure and if she is unable to, she will contact us.  She will plan to repeat an INR 5-7 days after resuming Coumadin.  The patient verbalized understanding.

## 2021-03-09 ENCOUNTER — Encounter (HOSPITAL_BASED_OUTPATIENT_CLINIC_OR_DEPARTMENT_OTHER): Payer: Self-pay | Admitting: Orthopedic Surgery

## 2021-03-10 ENCOUNTER — Other Ambulatory Visit: Payer: Self-pay

## 2021-03-10 ENCOUNTER — Ambulatory Visit (INDEPENDENT_AMBULATORY_CARE_PROVIDER_SITE_OTHER): Payer: Medicare PPO | Admitting: Family Medicine

## 2021-03-10 ENCOUNTER — Encounter (HOSPITAL_BASED_OUTPATIENT_CLINIC_OR_DEPARTMENT_OTHER): Payer: Self-pay | Admitting: Orthopedic Surgery

## 2021-03-10 ENCOUNTER — Encounter: Payer: Self-pay | Admitting: Family Medicine

## 2021-03-10 VITALS — BP 120/56 | HR 74 | Temp 96.8°F | Resp 16 | Ht 64.0 in | Wt 181.0 lb

## 2021-03-10 DIAGNOSIS — K219 Gastro-esophageal reflux disease without esophagitis: Secondary | ICD-10-CM

## 2021-03-10 DIAGNOSIS — I10 Essential (primary) hypertension: Secondary | ICD-10-CM

## 2021-03-10 DIAGNOSIS — E1140 Type 2 diabetes mellitus with diabetic neuropathy, unspecified: Secondary | ICD-10-CM

## 2021-03-10 DIAGNOSIS — I5032 Chronic diastolic (congestive) heart failure: Secondary | ICD-10-CM | POA: Diagnosis not present

## 2021-03-10 DIAGNOSIS — I4811 Longstanding persistent atrial fibrillation: Secondary | ICD-10-CM | POA: Diagnosis not present

## 2021-03-10 DIAGNOSIS — I110 Hypertensive heart disease with heart failure: Secondary | ICD-10-CM

## 2021-03-10 DIAGNOSIS — D492 Neoplasm of unspecified behavior of bone, soft tissue, and skin: Secondary | ICD-10-CM | POA: Diagnosis not present

## 2021-03-10 DIAGNOSIS — G8929 Other chronic pain: Secondary | ICD-10-CM

## 2021-03-10 DIAGNOSIS — M545 Low back pain, unspecified: Secondary | ICD-10-CM

## 2021-03-10 DIAGNOSIS — E7800 Pure hypercholesterolemia, unspecified: Secondary | ICD-10-CM | POA: Diagnosis not present

## 2021-03-10 DIAGNOSIS — M810 Age-related osteoporosis without current pathological fracture: Secondary | ICD-10-CM | POA: Diagnosis not present

## 2021-03-10 DIAGNOSIS — D6869 Other thrombophilia: Secondary | ICD-10-CM

## 2021-03-10 DIAGNOSIS — Z794 Long term (current) use of insulin: Secondary | ICD-10-CM

## 2021-03-10 DIAGNOSIS — E6609 Other obesity due to excess calories: Secondary | ICD-10-CM

## 2021-03-10 DIAGNOSIS — Z6831 Body mass index (BMI) 31.0-31.9, adult: Secondary | ICD-10-CM

## 2021-03-10 MED ORDER — ATORVASTATIN CALCIUM 40 MG PO TABS: 40.0000 mg | ORAL_TABLET | Freq: Every day | ORAL | 1 refills | Status: DC

## 2021-03-10 MED ORDER — PANTOPRAZOLE SODIUM 40 MG PO TBEC: 40.0000 mg | DELAYED_RELEASE_TABLET | Freq: Every morning | ORAL | 3 refills | Status: DC

## 2021-03-10 NOTE — Progress Notes (Signed)
Subjective:  Patient ID: Pamela [name], female    DOB: 1945-10-16  Age: 75 y.o. MRN: [id number]  Chief Complaint  Patient presents with   Diabetes   Hyperlipidemia    HPI  1. Type 2 diabetes mellitus with diabetic neuropathy, with long-term current use of insulin ([place])  Sent to endocrinology. Novolog 4-7 units ID, Toujeo 20 units at bedtime. Using free style libre.ss.  2. Chronic diastolic congestive heart failure (HCC) Lasix 20 mg daily  3. Longstanding persistent atrial fibrillation (HCC) Coumadin 5 mg managed by Cardiology  4. Osteoporosis, post-menopausal Prolia every 6 months.  On calcium with vitamin D.   6. Essential hypertension Lisinopril 40 mg daily  7. Cholesterol: eating healthy.  Lipitor 40 mg twice daily  8. Asthma Ventolin and Duoneb prn. Rarely needs them.   9. Chronic Back Pain Menthol Patch and Robaxin 500 mg which help some. Unable touse nsaids due to use of warfarin.   10. GERD  Protonix 40 mg daily controls gerd. Current Outpatient Medications on File Prior to Visit  Medication Sig Dispense Refill   acetaminophen (TYLENOL) 500 MG tablet Take 500-1,000 mg by mouth every 6 (six) hours as needed (for pain.).     albuterol (VENTOLIN HFA) 108 (90 Base) MCG/ACT inhaler Inhale 1-2 puffs into the lungs every 6 (six) hours as needed for wheezing or shortness of breath.     aspirin EC 81 MG tablet Take 1 tablet (81 mg total) by mouth daily. 90 tablet 3   azelastine (ASTELIN) 137 MCG/SPRAY nasal spray Place 2 sprays into the nose daily as needed for rhinitis or allergies.     B Complex-C (B-COMPLEX WITH VITAMIN C) tablet Take 1 tablet by mouth daily with lunch.     Cholecalciferol (VITAMIN D3) 50 MCG (2000 UT) TABS Take 2,000 Units by mouth daily with lunch.      Continuous Blood Gluc Receiver (FREESTYLE LIBRE 2 READER) DEVI 1 each by Does not apply route 4 (four) times daily - after meals and at bedtime. E11.40 1 each 0   Continuous Blood Gluc  Sensor (FREESTYLE LIBRE 2 SENSOR) MISC 2 each by Does not apply route every 14 (fourteen) days. E11.40 6 each 3   denosumab (PROLIA) 60 MG/ML SOSY injection Inject 60 mg into the skin every 6 (six) months.     estradiol (ESTRACE) 0.1 MG/GM vaginal cream Place 1 Applicatorful vaginally 2 (two) times a week. Uses twice weekly     ferrous sulfate 325 (65 FE) MG tablet Take 325 mg by mouth daily with lunch.     furosemide (LASIX) 20 MG tablet Take 1 tablet (20 mg total) by mouth daily. (Patient taking differently: Take 40 mg by mouth 2 (two) times daily.) 90 tablet 3   HYDROcodone-acetaminophen (NORCO/VICODIN) 5-325 MG tablet Take 1 tablet by mouth every 4 (four) hours as needed for moderate pain. 20 tablet 0   insulin aspart (NOVOLOG FLEXPEN) 100 UNIT/ML FlexPen Inject 4-7 Units into the skin 3 (three) times daily with meals. If glucose above 90 and eating.  Follow SSI 15 mL 0   insulin glargine, 1 Unit Dial, (TOUJEO) 300 UNIT/ML Solostar Pen Inject 20 Units into the skin at bedtime. 135 mL 1   Insulin Pen Needle (BD PEN NEEDLE NANO 2ND GEN) 32G X 4 MM MISC USE DAILY AS DIRECTED 100 each 3   ipratropium-albuterol (DUONEB) 0.5-2.5 (3) MG/3ML SOLN Take 3 mLs by nebulization 4 (four) times daily as needed (wheezing/shortness of breath).  0  lisinopril (ZESTRIL) 20 MG tablet Take 2 tablets (40 mg total) by mouth 2 (two) times daily. 180 tablet 1   magnesium oxide (MAG-OX) 400 (240 Mg) MG tablet TAKE 2 TABLETS(800 MG) BY MOUTH TWICE DAILY (Patient taking differently: Take 800 mg by mouth 2 (two) times daily.) 120 tablet 2   Menthol-Methyl Salicylate (SALONPAS PAIN RELIEF PATCH) PTCH Apply 1 patch topically daily as needed (pain.).     methocarbamol (ROBAXIN) 500 MG tablet Take 500 mg by mouth every 8 (eight) hours as needed for muscle spasms.     ondansetron (ZOFRAN) 4 MG tablet Take 1 tablet (4 mg total) by mouth daily as needed for nausea or vomiting. 30 tablet 1   ondansetron (ZOFRAN-ODT) 8 MG  disintegrating tablet Take 8 mg by mouth every 8 (eight) hours as needed for nausea or vomiting.      polyethylene glycol (MIRALAX / GLYCOLAX) 17 g packet Take 17 g by mouth as needed. Uses 1-2 times weekly     senna-docusate (SENOKOT-S) 8.6-50 MG tablet Take 1 tablet by mouth at bedtime.     sodium chloride (OCEAN) 0.65 % SOLN nasal spray Place 1-2 sprays into both nostrils 4 (four) times daily as needed for congestion.     trimethoprim (TRIMPEX) 100 MG tablet Take 100 mg by mouth at bedtime.     Vibegron (GEMTESA PO) Take by mouth at bedtime.     warfarin (COUMADIN) 1 MG tablet Take 1 tablet (1 mg total) by mouth See admin instructions. Take as directed 120 tablet 2   warfarin (COUMADIN) 5 MG tablet TAKE 1 TABLET BY MOUTH EVERY DAY (Patient taking differently: Take by mouth as directed.) 90 tablet 1   No current facility-administered medications on file prior to visit.   Past Medical History:  Diagnosis Date   Anticoagulated on Coumadin    chronic--- managed by hematology/ oncology,   Asthma, mild intermittent    followed by pcp--- per pt last exacerbation winter 2021 w/ acute bronchitis   Bilateral leg cramps    Chronic constipation    CKD (chronic kidney disease), stage III ([place])    DDD (degenerative disc disease), cervical    w/ spondylosis,  per pt last steroid injection 06/ 2022   DDD (degenerative disc disease), lumbosacral    Dyspnea    occasionally   GERD (gastroesophageal reflux disease)    History of cardiac murmur as a child    History of DVT of lower extremity    left lower extremity in 1980s, fell when bowling   History of pulmonary embolus (PE) 1993   per pt left lung post op 2 wks cholecystectomy   History of rheumatic fever as a child    per last echo 12-31-2019 no valvular issues   History of TIA (transient ischemic attack)    2014 and 2018 or 2019,  per pt no residual   HTN (hypertension)    followed by pcp   Hyperlipidemia    IDA (iron deficiency anemia)     Macular degeneration of both eyes    Mild obstructive sleep apnea    per pt dx 2017 tried to uses cpap but intolerant   Mixed incontinence urge and stress    urologist--- dr Nila Nephew   OA (osteoarthritis) 07/06/2018   Osteoporosis    PAF (paroxysmal atrial fibrillation) ([place]) 10/01/2014   cardiologist--- dr Godfrey Pick tobb;   cardiac cath 02-18-2013 normal coronaries arteries, ef 50%, cath done since echo showed ef 30-35%; nucleat stress study 04/ 2020 normal ,  normal echo 04/ 2021,  event monitor 09-14-2020 rare ST/AT variable block   PONV (postoperative nausea and vomiting)    Protein S deficiency ([place])    followed by hemotology/ oncology-- dr d. Bobby Rumpf ( [facility]) dx 1980s;  prior DVT left lower leg 1980s and left lung PE 1993; chronic  coumadin since 1980s   PVC's (premature ventricular contractions)    followed by cardiology   Solitary pulmonary nodule on lung CT 02/06/2019   First noted 01/13/2014 > no change as of 12/21/2018   Spondylolisthesis, lumbar region 08/09/2018   Type 2 diabetes mellitus treated with insulin ([place])    endocrilogist--- whitney reardon NP     (03-10-2021  pt continuously checks blood sugar throughout the day w/ Libre, fasting sugar --- 69--200)   Wears glasses    Wears hearing aid in both ears    Past Surgical History:  Procedure Laterality Date   CARDIAC CATHETERIZATION  02/18/2013   @ [place]  by Dr Martinique;  normal coronaries w/ preserved LVF, ef 50%;   previous cath 2001 normal ef 65%   CARPAL TUNNEL RELEASE Bilateral 1994   CATARACT EXTRACTION W/ INTRAOCULAR LENS IMPLANT Bilateral 2017   CHOLECYSTECTOMY, LAPAROSCOPIC  1993   COLONOSCOPY     FINGER SURGERY Left 2018   thumb   FOOT TENDON SURGERY Right    early 2000s   HARDWARE REMOVAL Left 03/12/2021   Procedure: HARDWARE REMOVAL;  Surgeon: Nicholes Stairs, MD;  [facility]: [facility];  Service: Orthopedics;  Laterality: Left;  60 MINS   ORIF ANKLE FRACTURE Left 02/26/2020    Procedure: OPEN REDUCTION INTERNAL FIXATION (ORIF) ANKLE FRACTURE;  Surgeon: Nicholes Stairs, MD;  Location: [facility];  Service: Orthopedics;  Laterality: Left;  90 mins   TONSILLECTOMY  1950   TOTAL KNEE ARTHROPLASTY Left 03/2005   TOTAL VAGINAL HYSTERECTOMY  1988   per pt still has ovaries    Family History  Problem Relation Age of Onset   Cirrhosis Mother    Antithrombin III deficiency Mother        multiple emboli   Ulcerative colitis Mother    Coronary artery disease Father    Heart attack Father    Hypertension Father    Protein S deficiency Sister        prior DVT   Protein S deficiency Daughter    Hypertension Sister    Hypertension Brother    Lung cancer Brother    Heart attack Brother    Heart attack Sister        age 88    Stroke Neg Hx    Colitis Neg Hx    Colon polyps Neg Hx    Esophageal cancer Neg Hx    Liver cancer Neg Hx    Pancreatic cancer Neg Hx    Rectal cancer Neg Hx    Stomach cancer Neg Hx    Social History   Socioeconomic History   Marital status: Married    Spouse name: [name]   Number of children: Not on file   Years of education: Not on file   Highest education level: Not on file  Occupational History   Not on file  Tobacco Use   Smoking status: Never   Smokeless tobacco: Never  Vaping Use   Vaping Use: Never used  Substance and Sexual Activity   Alcohol use: No   Drug use: Never   Sexual activity: Not on file  Other Topics Concern   Not  on file  Social History Narrative   Lives with spouse in [place].  2 grown daughters.   Retired Glass blower/designer     Social Determinants of Radio broadcast assistant Strain: Not on file  Food Insecurity: No Food Insecurity   Worried About Charity fundraiser in the Last Year: Never true   Arboriculturist in the Last Year: Never true  Transportation Needs: No Transportation Needs   Lack of Transportation (Medical): No   Lack of Transportation (Non-Medical): No  Physical Activity: Not on  file  Stress: Not on file  Social Connections: Not on file    Review of Systems  Constitutional:  Positive for fatigue. Negative for chills and fever.  HENT:  Positive for rhinorrhea. Negative for congestion and sore throat.   Respiratory:  Negative for cough and shortness of breath.   Cardiovascular:  Positive for leg swelling (left ankle). Negative for chest pain and palpitations.  Gastrointestinal:  Positive for constipation (resolved.). Negative for abdominal pain, diarrhea, nausea and vomiting.  Endocrine: Positive for polydipsia and polyuria. Negative for polyphagia.  Genitourinary:  Negative for dysuria, hematuria and urgency.  Musculoskeletal:  Positive for arthralgias (left ankle, right shoulder pain), back pain and neck pain (3 steroid shots. Second shot helped.). Negative for myalgias.  Skin:        Skin lesions on face and upper shoulder   Neurological:  Positive for headaches (tension). Negative for dizziness and weakness. Light-headedness: with hypoglycemia.[place] Psychiatric/Behavioral:  Negative for dysphoric mood. The patient is not nervous/anxious.     Objective:  BP (!) 120/56   Pulse 74   Temp (!) 96.8 F (36 C)   Resp 16   Ht 5\' 4"  (1.626 m)   Wt 181 lb (82.1 kg)   BMI 31.07 kg/m   BP/Weight 03/12/2021 03/12/2422 01/06/6143  Systolic BP 315 400 867  Diastolic BP 65 56 72  Wt. (Lbs) 181.7 181 186  BMI 30.71 31.07 31.93    Physical Exam Vitals reviewed.  Constitutional:      Appearance: Normal appearance. She is obese.  Neck:     Vascular: No carotid bruit.  Cardiovascular:     Rate and Rhythm: Normal rate. Rhythm irregular.     Pulses: Normal pulses.     Heart sounds: Normal heart sounds.  Pulmonary:     Effort: Pulmonary effort is normal. No respiratory distress.     Breath sounds: Normal breath sounds.  Abdominal:     General: Abdomen is flat. Bowel sounds are normal.     Palpations: Abdomen is soft.     Tenderness: There is no abdominal tenderness.   Musculoskeletal:        General: Tenderness (lumbar) present.  Skin:    Comments: Scaling lesion of left pinna.  3 erytheatous macular lesions on her left cheek.   Neurological:     Mental Status: She is alert and oriented to person, place, and time.  Psychiatric:        Mood and Affect: Mood normal.        Behavior: Behavior normal.    Diabetic Foot Exam - Simple   Simple Foot Form Visual Inspection No deformities, no ulcerations, no other skin breakdown bilaterally: Yes Sensation Testing Intact to touch and monofilament testing bilaterally: Yes Pulse Check Posterior Tibialis and Dorsalis pulse intact bilaterally: Yes Comments      Lab Results  Component Value Date   WBC 7.2 03/10/2021   HGB 13.2 03/10/2021   HCT  39.0 03/10/2021   PLT 258 03/10/2021   GLUCOSE 42 (L) 03/10/2021   CHOL 136 03/10/2021   TRIG 58 03/10/2021   HDL 64 03/10/2021   LDLCALC 60 03/10/2021   ALT 21 03/10/2021   AST 26 03/10/2021   NA 138 03/10/2021   K 4.2 03/10/2021   CL 101 03/10/2021   CREATININE 1.24 (H) 03/10/2021   BUN 52 (H) 03/10/2021   CO2 24 03/10/2021   TSH 2.020 11/14/2019   INR 1.2 03/12/2021   HGBA1C 9.8 (H) 11/12/2020   MICROALBUR 10 07/29/2020      Assessment & Plan:   1. Type 2 diabetes mellitus with diabetic neuropathy, with long-term current use of insulin (HCC) Continue cgm. Recommendations per endocrine. Recommend check feet daily. Recommend annual eye exams. Continue to work on eating a healthy diet and exercise.  Managed by endocrine.  2. Chronic diastolic congestive heart failure ([place]) The current medical regimen is effective;  continue present plan and medications. - CBC with Differential/Platelet - Comprehensive metabolic panel  3. Longstanding persistent atrial fibrillation ([facility]) Managed by Cardiology  4. Osteoporosis, post-menopausal Continue prolia  5. Acquired thrombophilia ([place]) Due to coumadin.   6. Essential hypertension Well  controlled.  No changes to medicines.  Continue to work on eating a healthy diet and exercise.  Labs drawn today.  - Lipid panel  7. Atypical squamoproliferative skin lesion - Ambulatory referral to Dermatology  8. Hypercholesteremia The current medical regimen is effective;  continue present plan and medications. - atorvastatin (LIPITOR) 40 MG tablet; Take 1 tablet (40 mg total) by mouth daily at 12 noon. (Patient taking differently: Take 40 mg by mouth daily with lunch.)  Dispense: 90 tablet; Refill: 1  9. Gastroesophageal reflux disease without esophagitis - pantoprazole (PROTONIX) 40 MG tablet; Take 1 tablet (40 mg total) by mouth every morning. (Patient taking differently: Take 40 mg by mouth every morning.)  Dispense: 90 tablet; Refill: 3   10. HTN Heart Disease with CHF The current medical regimen is effective;  continue present plan and medications.  11. Low Back Pain Fair control The current medical regimen is effective;  continue present plan and medications.     Follow-up: Return in about 3 months (around 06/10/2021) for fasting.  An After Visit Summary was printed and given to the patient.  [name], MD [facility] [phone]

## 2021-03-10 NOTE — Progress Notes (Signed)
Spoke w/ via phone for pre-op interview--- Pt Lab needs dos----  PT/ INR             Lab results------ pt had lab work done at [facility] office, CBCdiff/ CMP, done 03-10-2021 results will be in epic;  current ekg in epic/ chart COVID test -----patient states asymptomatic no test needed  Arrive at ------- 1130 on 03-12-2021 NPO after MN NO Solid Food.  Clear liquids from MN until--- 1030 Med rec completed Medications to take morning of surgery ----- Protonix Diabetic medication ----- do not do novolog insulin morning of surgery;  do half dose of toujeo night before surgery Patient instructed no nail polish to be worn day of surgery  Patient instructed to bring photo id and insurance card day of surgery Patient aware to have Driver (ride ) / caregiver  for 24 hours after surgery -husband, [name] Patient Special Instructions ----- pt's just have a current rescue inhaler needs refill Pre-Op special Istructions ----- pt has clearance to stop coumadin by Rosanne Sack PA dated 03-01-2021 with addendum 03-05-2021 to stop 2 days prior to surgery  Pt has Libre continuous glucose monitor on left arm.  Patient verbalized understanding of instructions that were given at this phone interview. Patient denies shortness of breath, chest pain, fever, cough at this phone interview.   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  PCP:  Dr Raliegh Ip. Cox [facility] Clement 03-10-2021 epic) Cardiologist : Dr Raliegh Ip. Sherren Mocha [facility] 09-11-2020 epic) Endocrinologist:  Rayetta Pigg NP (lov 02-08-2021 epic) Hematology/ onocology:  Dr Argentina Donovan (lov 02-03-2021 epic)  Chest x-ray : no EKG : 09-02-2020 epic Echo : 12-31-2019 epic Stress test: nuclear 12-22-2018 epic Cardiac Cath : 02-18-2013 epic Event monitor:  09-14-2020 epic Coronary CT:   10-02-2020 epic  Activity level: only occasional sob in heat Sleep Study/ CPAP : YES/  no Fasting Blood Sugar :   69--200   / Checks Blood Sugar -- times a day:  continuous through day w/ [name]  Blood Thinner/ Instructions Maryjane Hurter Dose: Coumadin ASA / Instructions/ Last Dose :  ASA 81mg  Per pt given instructions by hematology to stop coumadin 2 days prior and last dose was 03-09-2021 .

## 2021-03-11 LAB — CBC WITH DIFFERENTIAL/PLATELET
Basophils Absolute: 0.1 10*3/uL (ref 0.0–0.2)
Basos: 1 %
EOS (ABSOLUTE): 0.1 10*3/uL (ref 0.0–0.4)
Eos: 2 %
Hematocrit: 39 % (ref 34.0–46.6)
Hemoglobin: 13.2 g/dL (ref 11.1–15.9)
Immature Grans (Abs): 0 10*3/uL (ref 0.0–0.1)
Immature Granulocytes: 0 %
Lymphocytes Absolute: 1.6 10*3/uL (ref 0.7–3.1)
Lymphs: 22 %
MCH: 30.7 pg (ref 26.6–33.0)
MCHC: 33.8 g/dL (ref 31.5–35.7)
MCV: 91 fL (ref 79–97)
Monocytes Absolute: 0.7 10*3/uL (ref 0.1–0.9)
Monocytes: 10 %
Neutrophils Absolute: 4.7 10*3/uL (ref 1.4–7.0)
Neutrophils: 65 %
Platelets: 258 10*3/uL (ref 150–450)
RBC: 4.3 x10E6/uL (ref 3.77–5.28)
RDW: 12.1 % (ref 11.7–15.4)
WBC: 7.2 10*3/uL (ref 3.4–10.8)

## 2021-03-11 LAB — COMPREHENSIVE METABOLIC PANEL
ALT: 21 IU/L (ref 0–32)
AST: 26 IU/L (ref 0–40)
Albumin/Globulin Ratio: 1.8 (ref 1.2–2.2)
Albumin: 4.3 g/dL (ref 3.7–4.7)
Alkaline Phosphatase: 60 IU/L (ref 44–121)
BUN/Creatinine Ratio: 42 — ABNORMAL HIGH (ref 12–28)
BUN: 52 mg/dL — ABNORMAL HIGH (ref 8–27)
Bilirubin Total: 0.5 mg/dL (ref 0.0–1.2)
CO2: 24 mmol/L (ref 20–29)
Calcium: 9.7 mg/dL (ref 8.7–10.3)
Chloride: 101 mmol/L (ref 96–106)
Creatinine, Ser: 1.24 mg/dL — ABNORMAL HIGH (ref 0.57–1.00)
Globulin, Total: 2.4 g/dL (ref 1.5–4.5)
Glucose: 42 mg/dL — ABNORMAL LOW (ref 65–99)
Potassium: 4.2 mmol/L (ref 3.5–5.2)
Sodium: 138 mmol/L (ref 134–144)
Total Protein: 6.7 g/dL (ref 6.0–8.5)
eGFR: 45 mL/min/{1.73_m2} — ABNORMAL LOW (ref >59)

## 2021-03-11 LAB — LIPID PANEL
Chol/HDL Ratio: 2.1 ratio (ref 0.0–4.4)
Cholesterol, Total: 136 mg/dL (ref 100–199)
HDL: 64 mg/dL (ref >39)
LDL Chol Calc (NIH): 60 mg/dL (ref 0–99)
Triglycerides: 58 mg/dL (ref 0–149)
VLDL Cholesterol Cal: 12 mg/dL (ref 5–40)

## 2021-03-11 LAB — CARDIOVASCULAR RISK ASSESSMENT

## 2021-03-12 ENCOUNTER — Ambulatory Visit (HOSPITAL_BASED_OUTPATIENT_CLINIC_OR_DEPARTMENT_OTHER)
Admission: RE | Admit: 2021-03-12 | Discharge: 2021-03-12 | Disposition: A | Discharge status: Home / Self Care | Payer: Medicare PPO | Attending: Orthopedic Surgery | Admitting: Orthopedic Surgery

## 2021-03-12 ENCOUNTER — Encounter (HOSPITAL_BASED_OUTPATIENT_CLINIC_OR_DEPARTMENT_OTHER)
Admission: RE | Disposition: A | Discharge status: Home / Self Care | Payer: Self-pay | Source: Home / Self Care | Attending: Orthopedic Surgery

## 2021-03-12 ENCOUNTER — Encounter (HOSPITAL_BASED_OUTPATIENT_CLINIC_OR_DEPARTMENT_OTHER): Payer: Self-pay | Admitting: Orthopedic Surgery

## 2021-03-12 ENCOUNTER — Ambulatory Visit (HOSPITAL_BASED_OUTPATIENT_CLINIC_OR_DEPARTMENT_OTHER): Payer: Medicare PPO | Admitting: Anesthesiology

## 2021-03-12 DIAGNOSIS — Z886 Allergy status to analgesic agent status: Secondary | ICD-10-CM | POA: Insufficient documentation

## 2021-03-12 DIAGNOSIS — Z881 Allergy status to other antibiotic agents status: Secondary | ICD-10-CM | POA: Diagnosis not present

## 2021-03-12 DIAGNOSIS — T8484XA Pain due to internal orthopedic prosthetic devices, implants and grafts, initial encounter: Secondary | ICD-10-CM | POA: Insufficient documentation

## 2021-03-12 DIAGNOSIS — Z7901 Long term (current) use of anticoagulants: Secondary | ICD-10-CM | POA: Insufficient documentation

## 2021-03-12 DIAGNOSIS — Z8673 Personal history of transient ischemic attack (TIA), and cerebral infarction without residual deficits: Secondary | ICD-10-CM | POA: Diagnosis not present

## 2021-03-12 DIAGNOSIS — Z7982 Long term (current) use of aspirin: Secondary | ICD-10-CM | POA: Diagnosis not present

## 2021-03-12 DIAGNOSIS — Z86711 Personal history of pulmonary embolism: Secondary | ICD-10-CM | POA: Insufficient documentation

## 2021-03-12 DIAGNOSIS — E7800 Pure hypercholesterolemia, unspecified: Secondary | ICD-10-CM | POA: Diagnosis not present

## 2021-03-12 DIAGNOSIS — Z79899 Other long term (current) drug therapy: Secondary | ICD-10-CM | POA: Diagnosis not present

## 2021-03-12 DIAGNOSIS — Z794 Long term (current) use of insulin: Secondary | ICD-10-CM | POA: Diagnosis not present

## 2021-03-12 DIAGNOSIS — D6859 Other primary thrombophilia: Secondary | ICD-10-CM | POA: Diagnosis not present

## 2021-03-12 DIAGNOSIS — Z888 Allergy status to other drugs, medicaments and biological substances status: Secondary | ICD-10-CM | POA: Insufficient documentation

## 2021-03-12 DIAGNOSIS — Z79890 Hormone replacement therapy (postmenopausal): Secondary | ICD-10-CM | POA: Insufficient documentation

## 2021-03-12 DIAGNOSIS — Z86718 Personal history of other venous thrombosis and embolism: Secondary | ICD-10-CM | POA: Insufficient documentation

## 2021-03-12 DIAGNOSIS — Z96652 Presence of left artificial knee joint: Secondary | ICD-10-CM | POA: Diagnosis not present

## 2021-03-12 DIAGNOSIS — Y831 Surgical operation with implant of artificial internal device as the cause of abnormal reaction of the patient, or of later complication, without mention of misadventure at the time of the procedure: Secondary | ICD-10-CM | POA: Diagnosis not present

## 2021-03-12 DIAGNOSIS — E871 Hypo-osmolality and hyponatremia: Secondary | ICD-10-CM | POA: Diagnosis not present

## 2021-03-12 DIAGNOSIS — S82842S Displaced bimalleolar fracture of left lower leg, sequela: Secondary | ICD-10-CM | POA: Diagnosis not present

## 2021-03-12 DIAGNOSIS — K219 Gastro-esophageal reflux disease without esophagitis: Secondary | ICD-10-CM | POA: Diagnosis not present

## 2021-03-12 HISTORY — DX: Hyperlipidemia, unspecified: E78.5

## 2021-03-12 HISTORY — DX: Mixed incontinence: N39.46

## 2021-03-12 HISTORY — DX: Unspecified macular degeneration: H35.30

## 2021-03-12 HISTORY — DX: Presence of spectacles and contact lenses: Z97.3

## 2021-03-12 HISTORY — DX: Personal history of other venous thrombosis and embolism: Z86.718

## 2021-03-12 HISTORY — PX: HARDWARE REMOVAL: SHX979

## 2021-03-12 HISTORY — DX: Iron deficiency anemia, unspecified: D50.9

## 2021-03-12 HISTORY — DX: Age-related osteoporosis without current pathological fracture: M81.0

## 2021-03-12 HISTORY — DX: Personal history of other diseases of the circulatory system: Z86.79

## 2021-03-12 HISTORY — DX: Personal history of transient ischemic attack (TIA), and cerebral infarction without residual deficits: Z86.73

## 2021-03-12 HISTORY — DX: Other cervical disc degeneration, unspecified cervical region: M50.30

## 2021-03-12 HISTORY — DX: Personal history of other specified conditions: Z87.898

## 2021-03-12 HISTORY — DX: Chronic kidney disease, stage 3 unspecified: N18.30

## 2021-03-12 HISTORY — DX: Mild intermittent asthma, uncomplicated: J45.20

## 2021-03-12 HISTORY — DX: Type 2 diabetes mellitus without complications: E11.9

## 2021-03-12 HISTORY — DX: Other intervertebral disc degeneration, lumbosacral region: M51.37

## 2021-03-12 HISTORY — DX: Long term (current) use of anticoagulants: Z79.01

## 2021-03-12 HISTORY — DX: Presence of external hearing-aid: Z97.4

## 2021-03-12 HISTORY — DX: Type 2 diabetes mellitus without complications: Z79.4

## 2021-03-12 HISTORY — DX: Obstructive sleep apnea (adult) (pediatric): G47.33

## 2021-03-12 HISTORY — DX: Other intervertebral disc degeneration, lumbosacral region without mention of lumbar back pain or lower extremity pain: M51.379

## 2021-03-12 LAB — GLUCOSE, CAPILLARY
Glucose-Capillary: 137 mg/dL — ABNORMAL HIGH (ref 70–99)
Glucose-Capillary: 98 mg/dL (ref 70–99)

## 2021-03-12 LAB — PROTIME-INR
INR: 1.2 (ref 0.8–1.2)
Prothrombin Time: 14.9 seconds (ref 11.4–15.2)

## 2021-03-12 SURGERY — REMOVAL, HARDWARE
Anesthesia: General | Site: Ankle | Laterality: Left

## 2021-03-12 MED ORDER — ONDANSETRON HCL 4 MG/2ML IJ SOLN
INTRAMUSCULAR | Status: DC | PRN
  Administered 2021-03-12: 4 mg via INTRAVENOUS

## 2021-03-12 MED ORDER — CEFAZOLIN SODIUM-DEXTROSE 2-4 GM/100ML-% IV SOLN
INTRAVENOUS | Status: AC
  Filled 2021-03-12: qty 100

## 2021-03-12 MED ORDER — FENTANYL CITRATE (PF) 100 MCG/2ML IJ SOLN
INTRAMUSCULAR | Status: DC | PRN
  Administered 2021-03-12: 50 ug via INTRAVENOUS

## 2021-03-12 MED ORDER — LIDOCAINE HCL (PF) 2 % IJ SOLN
INTRAMUSCULAR | Status: AC
  Filled 2021-03-12: qty 5

## 2021-03-12 MED ORDER — CEFAZOLIN SODIUM-DEXTROSE 2-4 GM/100ML-% IV SOLN
2.0000 g | INTRAVENOUS | Status: AC
  Administered 2021-03-12: 2 g via INTRAVENOUS

## 2021-03-12 MED ORDER — PROPOFOL 10 MG/ML IV BOLUS
INTRAVENOUS | Status: AC
  Filled 2021-03-12: qty 20

## 2021-03-12 MED ORDER — ACETAMINOPHEN 500 MG PO TABS
1000.0000 mg | ORAL_TABLET | Freq: Once | ORAL | Status: AC
  Administered 2021-03-12: 1000 mg via ORAL

## 2021-03-12 MED ORDER — BUPIVACAINE-EPINEPHRINE 0.25% -1:200000 IJ SOLN
INTRAMUSCULAR | Status: DC | PRN
  Administered 2021-03-12: 11 mL

## 2021-03-12 MED ORDER — FENTANYL CITRATE (PF) 100 MCG/2ML IJ SOLN
INTRAMUSCULAR | Status: AC
  Filled 2021-03-12: qty 2

## 2021-03-12 MED ORDER — OXYCODONE HCL 5 MG PO TABS
5.0000 mg | ORAL_TABLET | Freq: Once | ORAL | Status: AC
  Administered 2021-03-12: 5 mg via ORAL

## 2021-03-12 MED ORDER — ONDANSETRON HCL 4 MG PO TABS: 4.0000 mg | ORAL_TABLET | Freq: Every day | ORAL | 1 refills | Status: DC | PRN

## 2021-03-12 MED ORDER — ACETAMINOPHEN 500 MG PO TABS
ORAL_TABLET | ORAL | Status: AC
  Filled 2021-03-12: qty 2

## 2021-03-12 MED ORDER — PROPOFOL 10 MG/ML IV BOLUS
INTRAVENOUS | Status: DC | PRN
  Administered 2021-03-12: 40 mg via INTRAVENOUS

## 2021-03-12 MED ORDER — HYDROCODONE-ACETAMINOPHEN 5-325 MG PO TABS: 1.0000 | ORAL_TABLET | ORAL | 0 refills | Status: DC | PRN

## 2021-03-12 MED ORDER — LIDOCAINE 2% (20 MG/ML) 5 ML SYRINGE
INTRAMUSCULAR | Status: DC | PRN
  Administered 2021-03-12: 60 mg via INTRAVENOUS

## 2021-03-12 MED ORDER — 0.9 % SODIUM CHLORIDE (POUR BTL) OPTIME
TOPICAL | Status: DC | PRN
  Administered 2021-03-12: 500 mL

## 2021-03-12 MED ORDER — FENTANYL CITRATE (PF) 100 MCG/2ML IJ SOLN: 25.0000 ug | INTRAMUSCULAR | Status: DC | PRN

## 2021-03-12 MED ORDER — DIPHENHYDRAMINE HCL 50 MG/ML IJ SOLN
INTRAMUSCULAR | Status: DC | PRN
  Administered 2021-03-12: 12.5 mg via INTRAVENOUS

## 2021-03-12 MED ORDER — OXYCODONE HCL 5 MG PO TABS
ORAL_TABLET | ORAL | Status: AC
  Filled 2021-03-12: qty 1

## 2021-03-12 MED ORDER — DEXAMETHASONE SODIUM PHOSPHATE 10 MG/ML IJ SOLN
INTRAMUSCULAR | Status: DC | PRN
  Administered 2021-03-12: 5 mg via INTRAVENOUS

## 2021-03-12 MED ORDER — ONDANSETRON HCL 4 MG/2ML IJ SOLN
INTRAMUSCULAR | Status: AC
  Filled 2021-03-12: qty 2

## 2021-03-12 MED ORDER — SODIUM CHLORIDE 0.9 % IV SOLN: INTRAVENOUS | Status: DC

## 2021-03-12 MED ORDER — DEXAMETHASONE SODIUM PHOSPHATE 10 MG/ML IJ SOLN
INTRAMUSCULAR | Status: AC
  Filled 2021-03-12: qty 1

## 2021-03-12 MED ORDER — PHENYLEPHRINE 40 MCG/ML (10ML) SYRINGE FOR IV PUSH (FOR BLOOD PRESSURE SUPPORT)
PREFILLED_SYRINGE | INTRAVENOUS | Status: DC | PRN
  Administered 2021-03-12: 200 ug via INTRAVENOUS
  Administered 2021-03-12 (×2): 80 ug via INTRAVENOUS
  Administered 2021-03-12: 40 ug via INTRAVENOUS

## 2021-03-12 SURGICAL SUPPLY — 52 items
BANDAGE ESMARK 6X9 LF (GAUZE/BANDAGES/DRESSINGS) IMPLANT
BLADE SURG 10 STRL SS (BLADE) IMPLANT
BLADE SURG 15 STRL LF DISP TIS (BLADE) ×1 IMPLANT
BLADE SURG 15 STRL SS (BLADE) ×2
BNDG CMPR 9X6 STRL LF SNTH (GAUZE/BANDAGES/DRESSINGS)
BNDG ELASTIC 4X5.8 VLCR STR LF (GAUZE/BANDAGES/DRESSINGS) ×2 IMPLANT
BNDG ELASTIC 6X5.8 VLCR STR LF (GAUZE/BANDAGES/DRESSINGS) IMPLANT
BNDG ESMARK 6X9 LF (GAUZE/BANDAGES/DRESSINGS)
BNDG GAUZE ELAST 4 BULKY (GAUZE/BANDAGES/DRESSINGS) IMPLANT
COVER MAYO STAND STRL (DRAPES) IMPLANT
CUFF TOURN SGL QUICK 34 (TOURNIQUET CUFF) ×2
CUFF TRNQT CYL 34X4.125X (TOURNIQUET CUFF) ×1 IMPLANT
DRAPE C-ARM 35X43 STRL (DRAPES) IMPLANT
DRAPE C-ARM 42X120 X-RAY (DRAPES) IMPLANT
DRAPE EXTREMITY T 121X128X90 (DISPOSABLE) IMPLANT
DRAPE INCISE IOBAN 66X45 STRL (DRAPES) ×2 IMPLANT
DRAPE SHEET LG 3/4 BI-LAMINATE (DRAPES) ×2 IMPLANT
DRAPE U-SHAPE 47X51 STRL (DRAPES) ×2 IMPLANT
DRSG ADAPTIC 3X8 NADH LF (GAUZE/BANDAGES/DRESSINGS) ×2 IMPLANT
DRSG PAD ABDOMINAL 8X10 ST (GAUZE/BANDAGES/DRESSINGS) ×4 IMPLANT
DURAPREP 26ML APPLICATOR (WOUND CARE) ×2 IMPLANT
ELECT REM PT RETURN 9FT ADLT (ELECTROSURGICAL) ×2
ELECTRODE REM PT RTRN 9FT ADLT (ELECTROSURGICAL) ×1 IMPLANT
GAUZE SPONGE 4X4 12PLY STRL (GAUZE/BANDAGES/DRESSINGS) ×2 IMPLANT
GAUZE SPONGE 4X4 12PLY STRL LF (GAUZE/BANDAGES/DRESSINGS) ×2 IMPLANT
GLOVE SRG 8 PF TXTR STRL LF DI (GLOVE) ×2 IMPLANT
GLOVE SURG ENC MOIS LTX SZ7.5 (GLOVE) ×4 IMPLANT
GLOVE SURG UNDER POLY LF SZ8 (GLOVE) ×4
GOWN STRL REUS W/TWL XL LVL3 (GOWN DISPOSABLE) ×4 IMPLANT
KIT TURNOVER CYSTO (KITS) ×2 IMPLANT
NEEDLE HYPO 22GX1.5 SAFETY (NEEDLE) IMPLANT
NS IRRIG 500ML POUR BTL (IV SOLUTION) ×2 IMPLANT
PACK ARTHROSCOPY DSU (CUSTOM PROCEDURE TRAY) ×2 IMPLANT
PACK BASIN DAY SURGERY FS (CUSTOM PROCEDURE TRAY) ×2 IMPLANT
PADDING CAST COTTON 6X4 STRL (CAST SUPPLIES) IMPLANT
PENCIL SMOKE EVACUATOR (MISCELLANEOUS) ×2 IMPLANT
SHOE DARCO MENS MED (MISCELLANEOUS) ×2 IMPLANT
SPONGE T-LAP 4X18 ~~LOC~~+RFID (SPONGE) IMPLANT
STOCKINETTE 6  STRL (DRAPES)
STOCKINETTE 6 STRL (DRAPES) IMPLANT
STRIP CLOSURE SKIN 1/2X4 (GAUZE/BANDAGES/DRESSINGS) ×2 IMPLANT
SUCTION FRAZIER HANDLE 10FR (MISCELLANEOUS) ×2
SUCTION TUBE FRAZIER 10FR DISP (MISCELLANEOUS) ×1 IMPLANT
SUT ETHILON 3 0 PS 1 (SUTURE) ×4 IMPLANT
SUT MNCRL AB 3-0 PS2 27 (SUTURE) IMPLANT
SUT VIC AB 1 CT1 36 (SUTURE) IMPLANT
SUT VIC AB 2-0 CT1 27 (SUTURE)
SUT VIC AB 2-0 CT1 TAPERPNT 27 (SUTURE) IMPLANT
SYR CONTROL 10ML LL (SYRINGE) IMPLANT
TOWEL OR 17X26 10 PK STRL BLUE (TOWEL DISPOSABLE) ×2 IMPLANT
TUBE CONNECTING 12X1/4 (SUCTIONS) ×2 IMPLANT
YANKAUER SUCT BULB TIP NO VENT (SUCTIONS) ×2 IMPLANT

## 2021-03-12 NOTE — Anesthesia Procedure Notes (Signed)
Procedure Name: LMA Insertion Date/Time: 03/12/2021 1:25 PM Performed by: Rogers Blocker, CRNA Pre-anesthesia Checklist: Patient identified, Emergency Drugs available, Suction available and Patient being monitored Patient Re-evaluated:Patient Re-evaluated prior to induction Oxygen Delivery Method: Circle System Utilized Preoxygenation: Pre-oxygenation with 100% oxygen Induction Type: IV induction Ventilation: Mask ventilation without difficulty LMA: LMA inserted LMA Size: 4.0 Number of attempts: 1 Placement Confirmation: positive ETCO2 Tube secured with: Tape Dental Injury: Teeth and Oropharynx as per pre-operative assessment

## 2021-03-12 NOTE — Op Note (Signed)
Date of Surgery: 03/12/2021  INDICATIONS: Pamela [name] is a 75 y.o.-year-old female with a left ankle painful retained hardware;  The Patient did consent to the procedure after discussion of the risks and benefits.  PREOPERATIVE DIAGNOSIS:  Left ankle painful orthopedic hardware  POSTOPERATIVE DIAGNOSIS: Same.  PROCEDURE: Removal of deep/buried orthopedic hardware left ankle  SURGEON: Geralynn Rile, M.D.  ASSIST: None.  ANESTHESIA:  general  IV FLUIDS AND URINE: See anesthesia.  ESTIMATED BLOOD LOSS: 2 mL.  IMPLANTS: None  DRAINS: None  COMPLICATIONS: None.  DESCRIPTION OF PROCEDURE: The patient was brought to the operating room and placed supine on the operating table.  The patient had been signed prior to the procedure and this was documented. The patient had the anesthesia placed by the anesthesiologist.  A time-out was performed to confirm that this was the correct patient, site, side and location. The patient did receive antibiotics prior to the incision and was re-dosed during the procedure as needed at indicated intervals.  A tourniquet was placed along the lower leg with an Esmarch this tourniquet was utilized for approximately 12 minutes.  The patient had the operative extremity prepped and draped in the standard surgical fashion.     We began the procedure on the medial side.  We used this previously utilized medial curvilinear incision over the medial malleolus.  We cut down onto the periosteum.  We then elevated anterior and posterior flaps to identify 2 cannulated screws in the medial malleolus.  We then used the cannulated screwdriver from the Arthrex ankle fracture set to remove the screws en bloc.  No complications there.  Next, we turned our attention to the lateral aspect of the ankle.  There was a prominent screw in the midportion of the plate which was used as an interfragmentary compression screw.  This was cut down on in line with the previously utilized lateral  incision.  We then remove the screw with the star driver.  No complications there.  Both wounds were then copiously irrigated with normal saline.  We then closed with 3-0 nylon interrupted.  Standard sterile dressing was applied as well as a postop shoe.  The patient was awakened from general anesthetic with no noted complications.  She was transported to PACU in stable condition.  POSTOPERATIVE PLAN:  [name] will be weightbearing as tolerated to the left lower extremity.  She will use the postop shoe while she is in her compression dressing.  She can take this down in 3 days and begin showering.  She should not submerge underwater.  I will see her back in the office in 2 weeks.

## 2021-03-12 NOTE — H&P (Signed)
ORTHOPAEDIC H and P  REQUESTING PHYSICIAN: Nicholes Stairs, MD  PCP:  [name], MD  Chief Complaint: Painful hardware left ankle  HPI: Pamela [name] is a 75 y.o. female who complains of persistent left ankle pain following open reduction internal fixation for unstable ankle fracture.  She is here today for selective hardware removal of the medial screws as well as prominent lateral screw.  Previously discussed this at length in the office.  No new complaints today.  Past Medical History:  Diagnosis Date   Anticoagulated on Coumadin    chronic--- managed by hematology/ oncology,   Asthma, mild intermittent    followed by pcp--- per pt last exacerbation winter 2021 w/ acute bronchitis   Bilateral leg cramps    Chronic constipation    CKD (chronic kidney disease), stage III ([place])    DDD (degenerative disc disease), cervical    w/ spondylosis,  per pt last steroid injection 06/ 2022   DDD (degenerative disc disease), lumbosacral    Dyspnea    occasionally   GERD (gastroesophageal reflux disease)    History of cardiac murmur as a child    History of DVT of lower extremity    left lower extremity in 1980s, fell when bowling   History of pulmonary embolus (PE) 1993   per pt left lung post op 2 wks cholecystectomy   History of rheumatic fever as a child    per last echo 12-31-2019 no valvular issues   History of TIA (transient ischemic attack)    2014 and 2018 or 2019,  per pt no residual   HTN (hypertension)    followed by pcp   Hyperlipidemia    IDA (iron deficiency anemia)    Macular degeneration of both eyes    Mild obstructive sleep apnea    per pt dx 2017 tried to uses cpap but intolerant   Mixed incontinence urge and stress    urologist--- dr Nila Nephew   OA (osteoarthritis) 07/06/2018   Osteoporosis    PAF (paroxysmal atrial fibrillation) ([place]) 10/01/2014   cardiologist--- dr Godfrey Pick tobb;   cardiac cath 02-18-2013 normal coronaries arteries, ef  50%, cath done since echo showed ef 30-35%; nucleat stress study 04/ 2020 normal , normal echo 04/ 2021,  event monitor 09-14-2020 rare ST/AT variable block   PONV (postoperative nausea and vomiting)    Protein S deficiency ([place])    followed by hemotology/ oncology-- dr d. Bobby Rumpf ([facility]) dx 1980s;  prior DVT left lower leg 1980s and left lung PE 1993; chronic  coumadin since 1980s   PVC's (premature ventricular contractions)    followed by cardiology   Solitary pulmonary nodule on lung CT 02/06/2019   First noted 01/13/2014 > no change as of 12/21/2018   Spondylolisthesis, lumbar region 08/09/2018   Type 2 diabetes mellitus treated with insulin ([place])    endocrilogist--- whitney reardon NP     (03-10-2021  pt continuously checks blood sugar throughout the day w/ Libre, fasting sugar --- 69--200)   Wears glasses    Wears hearing aid in both ears    Past Surgical History:  Procedure Laterality Date   CARDIAC CATHETERIZATION  02/18/2013   @ [place]  by Dr Martinique;  normal coronaries w/ preserved LVF, ef 50%;   previous cath 2001 normal ef 65%   CARPAL TUNNEL RELEASE Bilateral 1994   CATARACT EXTRACTION W/ INTRAOCULAR LENS IMPLANT Bilateral 2017   CHOLECYSTECTOMY, LAPAROSCOPIC  1993   COLONOSCOPY  FINGER SURGERY Left 2018   thumb   FOOT TENDON SURGERY Right    early 2000s   ORIF ANKLE FRACTURE Left 02/26/2020   Procedure: OPEN REDUCTION INTERNAL FIXATION (ORIF) ANKLE FRACTURE;  Surgeon: Nicholes Stairs, MD;  Location: [facility];  Service: Orthopedics;  Laterality: Left;  90 mins   TONSILLECTOMY  1950   TOTAL KNEE ARTHROPLASTY Left 03/2005   TOTAL VAGINAL HYSTERECTOMY  1988   per pt still has ovaries   Social History   Socioeconomic History   Marital status: Married    Spouse name: [name]   Number of children: Not on file   Years of education: Not on file   Highest education level: Not on file  Occupational History   Not on file  Tobacco Use   Smoking status:  Never   Smokeless tobacco: Never  Vaping Use   Vaping Use: Never used  Substance and Sexual Activity   Alcohol use: No   Drug use: Never   Sexual activity: Not on file  Other Topics Concern   Not on file  Social History Narrative   Lives with spouse in [place].  2 grown daughters.   Retired Glass blower/designer     Social Determinants of Radio broadcast assistant Strain: Not on file  Food Insecurity: No Food Insecurity   Worried About Charity fundraiser in the Last Year: Never true   Arboriculturist in the Last Year: Never true  Transportation Needs: No Transportation Needs   Lack of Transportation (Medical): No   Lack of Transportation (Non-Medical): No  Physical Activity: Not on file  Stress: Not on file  Social Connections: Not on file   Family History  Problem Relation Age of Onset   Cirrhosis Mother    Antithrombin III deficiency Mother        multiple emboli   Ulcerative colitis Mother    Coronary artery disease Father    Heart attack Father    Hypertension Father    Protein S deficiency Sister        prior DVT   Protein S deficiency Daughter    Hypertension Sister    Hypertension Brother    Lung cancer Brother    Heart attack Brother    Heart attack Sister        age 55    Stroke Neg Hx    Colitis Neg Hx    Colon polyps Neg Hx    Esophageal cancer Neg Hx    Liver cancer Neg Hx    Pancreatic cancer Neg Hx    Rectal cancer Neg Hx    Stomach cancer Neg Hx    Allergies  Allergen Reactions   Ketek [Telithromycin] Nausea And Vomiting and Rash   Loxapine Succinate Hives   Naproxen Sodium Anaphylaxis and Hives   Amoxapine And Related Hives   Darvon Nausea And Vomiting   Belsomra [Suvorexant] Other (See Comments)    "Nightmares"   Cymbalta [Duloxetine Hcl] Other (See Comments)    "Blackouts"   Gabapentin Other (See Comments)    Blurry vision   Lyrica [Pregabalin]     Makes her sedated   Amoxicillin Rash   Propoxyphene Nausea And Vomiting   Prior to  Admission medications   Medication Sig Start Date End Date Taking? Authorizing Provider  acetaminophen (TYLENOL) 500 MG tablet Take 500-1,000 mg by mouth every 6 (six) hours as needed (for pain.).   Yes [provider]  aspirin EC 81 MG tablet  Take 1 tablet (81 mg total) by mouth daily. 06/05/20  Yes Tobb, Kardie, DO  atorvastatin (LIPITOR) 40 MG tablet Take 1 tablet (40 mg total) by mouth daily at 12 noon. Patient taking differently: Take 40 mg by mouth daily with lunch. 03/10/21  Yes Cox, Kirsten, MD  B Complex-C (B-COMPLEX WITH VITAMIN C) tablet Take 1 tablet by mouth daily with lunch.   Yes [provider]  Cholecalciferol (VITAMIN D3) 50 MCG (2000 UT) TABS Take 2,000 Units by mouth daily with lunch.    Yes [provider]  Continuous Blood Gluc Receiver (FREESTYLE LIBRE 2 READER) DEVI 1 each by Does not apply route 4 (four) times daily - after meals and at bedtime. E11.40 11/20/20  Yes Cox, Kirsten, MD  Continuous Blood Gluc Sensor (FREESTYLE LIBRE 2 SENSOR) MISC 2 each by Does not apply route every 14 (fourteen) days. E11.40 11/20/20  Yes Cox, Kirsten, MD  estradiol (ESTRACE) 0.1 MG/GM vaginal cream Place 1 Applicatorful vaginally 2 (two) times a week. Uses twice weekly 06/10/20  Yes [provider]  ferrous sulfate 325 (65 FE) MG tablet Take 325 mg by mouth daily with lunch.   Yes [provider]  furosemide (LASIX) 20 MG tablet Take 1 tablet (20 mg total) by mouth daily. Patient taking differently: Take 40 mg by mouth 2 (two) times daily. 08/14/20 03/10/21 Yes Tobb, Kardie, DO  insulin aspart (NOVOLOG FLEXPEN) 100 UNIT/ML FlexPen Inject 4-7 Units into the skin 3 (three) times daily with meals. If glucose above 90 and eating.  Follow SSI 02/24/21  Yes Reardon, Loree Fee J, NP  insulin glargine, 1 Unit Dial, (TOUJEO) 300 UNIT/ML Solostar Pen Inject 20 Units into the skin at bedtime. Patient taking differently: Inject 20 Units into the skin at bedtime. 02/08/21   Yes Reardon, Loree Fee J, NP  Insulin Pen Needle (BD PEN NEEDLE NANO 2ND GEN) 32G X 4 MM MISC USE DAILY AS DIRECTED 11/13/20  Yes Cox, Kirsten, MD  lisinopril (ZESTRIL) 20 MG tablet Take 2 tablets (40 mg total) by mouth 2 (two) times daily. Patient taking differently: Take 40 mg by mouth 2 (two) times daily. 01/27/21  Yes Cox, Kirsten, MD  magnesium oxide (MAG-OX) 400 (240 Mg) MG tablet TAKE 2 TABLETS(800 MG) BY MOUTH TWICE DAILY Patient taking differently: Take 800 mg by mouth 2 (two) times daily. 02/15/21  Yes Lillard Anes, MD  Menthol-Methyl Salicylate [facility] PAIN RELIEF PATCH) PTCH Apply 1 patch topically daily as needed (pain.).   Yes [provider]  methocarbamol (ROBAXIN) 500 MG tablet Take 500 mg by mouth every 8 (eight) hours as needed for muscle spasms.   Yes [provider]  ondansetron (ZOFRAN-ODT) 8 MG disintegrating tablet Take 8 mg by mouth every 8 (eight) hours as needed for nausea or vomiting.    Yes [provider]  pantoprazole (PROTONIX) 40 MG tablet Take 1 tablet (40 mg total) by mouth every morning. Patient taking differently: Take 40 mg by mouth every morning. 03/10/21  Yes Cox, Kirsten, MD  polyethylene glycol (MIRALAX / GLYCOLAX) 17 g packet Take 17 g by mouth as needed. Uses 1-2 times weekly   Yes [provider]  senna-docusate (SENOKOT-S) 8.6-50 MG tablet Take 1 tablet by mouth at bedtime.   Yes [provider]  sodium chloride (OCEAN) 0.65 % SOLN nasal spray Place 1-2 sprays into both nostrils 4 (four) times daily as needed for congestion.   Yes [provider]  trimethoprim (TRIMPEX) 100 MG tablet Take 100  mg by mouth at bedtime.   Yes [provider]  Vibegron (GEMTESA PO) Take by mouth at bedtime.   Yes [provider]  warfarin (COUMADIN) 1 MG tablet Take 1 tablet (1 mg total) by mouth See admin instructions. Take as directed 02/12/21  Yes Mosher, Vida Roller A, PA-C  warfarin (COUMADIN) 5 MG  tablet TAKE 1 TABLET BY MOUTH EVERY DAY Patient taking differently: Take by mouth as directed. 12/23/20  Yes Mosher, Vida Roller A, PA-C  albuterol (VENTOLIN HFA) 108 (90 Base) MCG/ACT inhaler Inhale 1-2 puffs into the lungs every 6 (six) hours as needed for wheezing or shortness of breath. 09/17/19   [provider]  azelastine (ASTELIN) 137 MCG/SPRAY nasal spray Place 2 sprays into the nose daily as needed for rhinitis or allergies. 03/02/13   [provider]  denosumab (PROLIA) 60 MG/ML SOSY injection Inject 60 mg into the skin every 6 (six) months.    [provider]  ipratropium-albuterol (DUONEB) 0.5-2.5 (3) MG/3ML SOLN Take 3 mLs by nebulization 4 (four) times daily as needed (wheezing/shortness of breath). 11/07/17   [provider]   No results found.  Positive ROS: All other systems have been reviewed and were otherwise negative with the exception of those mentioned in the HPI and as above.  Physical Exam: General: Alert, no acute distress Cardiovascular: No pedal edema Respiratory: No cyanosis, no use of accessory musculature GI: No organomegaly, abdomen is soft and non-tender Skin: No lesions in the area of chief complaint Neurologic: Sensation intact distally Psychiatric: Patient is competent for consent with normal mood and affect Lymphatic: No axillary or cervical lymphadenopathy  MUSCULOSKELETAL: Left ankle:  Medial lateral incision is nicely healed.  No signs of dehiscence.  Prominent screw in the lateral plate as well as prominent screws medially along the medial malleolus.  These are painful.  Assessment: Painful orthopedic hardware left ankle  Plan: -Plan for selective hardware removal of the medial malleolar screws as well as the prominent lateral 3.5 mm cortical screw.  We discussed the risk of bleeding, infection, damage to surrounding nerves and vessels, wound breakdown, DVT, and the risk of anesthesia.  She has provided informed  consent.  -Plan for discharge home postoperatively from PACU.    Nicholes Stairs, MD Cell [phone]    03/12/2021 12:53 PM

## 2021-03-12 NOTE — Transfer of Care (Signed)
Immediate Anesthesia Transfer of Care Note  Patient: Pamela [name]  Procedure(s) Performed: HARDWARE REMOVAL (Left: Ankle)  Patient Location: PACU  Anesthesia Type:General  Level of Consciousness: awake, alert , oriented and patient cooperative  Airway & Oxygen Therapy: Patient Spontanous Breathing  Post-op Assessment: Report given to RN and Post -op Vital signs reviewed and stable  Post vital signs: Reviewed and stable  Last Vitals:  Vitals Value Taken Time  BP 162/68 03/12/21 1403  Temp 36.3 C 03/12/21 1403  Pulse 66 03/12/21 1405  Resp 15 03/12/21 1405  SpO2 98 % 03/12/21 1405  Vitals shown include unvalidated device data.  Last Pain:  Vitals:   03/12/21 1155  TempSrc: Oral  PainSc: 0-No pain      Patients Stated Pain Goal: 5 (96/04/54 0981)  Complications: No notable events documented.

## 2021-03-12 NOTE — Anesthesia Preprocedure Evaluation (Addendum)
Anesthesia Evaluation  Patient identified by MRN, date of birth, ID band Patient awake    Reviewed: Allergy & Precautions, NPO status , Patient's Chart, lab work & pertinent test results  History of Anesthesia Complications (+) PONV  Airway Mallampati: I  TM Distance: >3 FB Neck ROM: Full    Dental no notable dental hx. (+) Teeth Intact, Dental Advisory Given   Pulmonary asthma , sleep apnea (intolerant to CPAP) , PE   Pulmonary exam normal breath sounds clear to auscultation       Cardiovascular hypertension, Pt. on medications +CHF, + DOE and + DVT  Normal cardiovascular exam+ dysrhythmias Atrial Fibrillation  Rhythm:Regular Rate:Normal  TTE 2021 1. Left ventricular ejection fraction, by estimation, is 60 to 65%. The  left ventricle has normal function. The left ventricle has no regional  wall motion abnormalities. There is moderate concentric left ventricular  hypertrophy. Left ventricular  diastolic parameters are consistent with Grade I diastolic dysfunction  (impaired relaxation).  2. Right ventricular systolic function is normal. The right ventricular  size is normal. There is normal pulmonary artery systolic pressure.  3. Left atrial size was mildly dilated.  4. The mitral valve is normal in structure. Trivial mitral valve  regurgitation. No evidence of mitral stenosis.  5. The aortic valve is tricuspid. Aortic valve regurgitation is not  visualized. No aortic stenosis is present.  6. The inferior vena cava is normal in size with greater than 50%  respiratory variability, suggesting right atrial pressure of 3 mmHg.   Stress Test 2020 negative   Neuro/Psych TIACVA, No Residual Symptoms negative psych ROS   GI/Hepatic Neg liver ROS, GERD  ,  Endo/Other  diabetes, Type 2, Insulin Dependent  Renal/GU Renal InsufficiencyRenal disease (Cr 1.24, K 4.2)  negative genitourinary   Musculoskeletal negative  musculoskeletal ROS (+)   Abdominal   Peds  Hematology  (+) Blood dyscrasia (Protein S deficiency, on coumadin), ,   Anesthesia Other Findings   Reproductive/Obstetrics                            Anesthesia Physical Anesthesia Plan  ASA: 3  Anesthesia Plan: General   Post-op Pain Management:    Induction: Intravenous  PONV Risk Score and Plan: 4 or greater and Dexamethasone, Treatment may vary due to age or medical condition and Ondansetron  Airway Management Planned: LMA  Additional Equipment:   Intra-op Plan:   Post-operative Plan: Extubation in OR  Informed Consent: I have reviewed the patients History and Physical, chart, labs and discussed the procedure including the risks, benefits and alternatives for the proposed anesthesia with the patient or authorized representative who has indicated his/her understanding and acceptance.     Dental advisory given  Plan Discussed with: CRNA  Anesthesia Plan Comments:         Anesthesia Quick Evaluation

## 2021-03-12 NOTE — Brief Op Note (Signed)
03/12/2021  2:07 PM  PATIENT:  [name]  75 y.o. female  PRE-OPERATIVE DIAGNOSIS:  Left ankle painful hardware  POST-OPERATIVE DIAGNOSIS:  Left ankle painful hardware  PROCEDURE:  Procedure(s) with comments: HARDWARE REMOVAL (Left) - 60 MINS  SURGEON:  Surgeon(s) and Role:    * Stann Mainland, Elly Modena, MD - Primary  PHYSICIAN ASSISTANT: none  ASSISTANTS: none   ANESTHESIA:   local and general  EBL:  2 mL   BLOOD ADMINISTERED:none  DRAINS: none   LOCAL MEDICATIONS USED:  MARCAINE     SPECIMEN:  No Specimen  DISPOSITION OF SPECIMEN:  N/A  COUNTS:  YES  TOURNIQUET:  * Missing tourniquet times found for documented tourniquets in log: [id number] *  DICTATION: .Note written in EPIC  PLAN OF CARE: Discharge to home after PACU  PATIENT DISPOSITION:  PACU - hemodynamically stable.   Delay start of Pharmacological VTE agent (>24hrs) due to surgical blood loss or risk of bleeding: not applicable

## 2021-03-12 NOTE — Anesthesia Postprocedure Evaluation (Signed)
Anesthesia Post Note  Patient: Pamela [name]  Procedure(s) Performed: HARDWARE REMOVAL (Left: Ankle)     Patient location during evaluation: PACU Anesthesia Type: General Level of consciousness: awake and alert Pain management: pain level controlled Vital Signs Assessment: post-procedure vital signs reviewed and stable Respiratory status: spontaneous breathing, nonlabored ventilation, respiratory function stable and patient connected to nasal cannula oxygen Cardiovascular status: blood pressure returned to baseline and stable Postop Assessment: no apparent nausea or vomiting Anesthetic complications: no   No notable events documented.  Last Vitals:  Vitals:   03/12/21 1415 03/12/21 1430  BP: (!) 178/68 (!) 173/65  Pulse: 65 65  Resp: 16 18  Temp:    SpO2: 97% 99%    Last Pain:  Vitals:   03/12/21 1430  TempSrc:   PainSc: 0-No pain                 Lineth Thielke L May Ozment

## 2021-03-12 NOTE — Discharge Instructions (Addendum)
-  Okay for full weightbearing as tolerated to the left leg.  He should try to elevate the leg when able around-the-clock.  She also apply ice to the left ankle for 20 to 30 minutes out of each hour that you are able around-the-clock.  -Maintain postoperative bandages until postoperative day #3.  He may then remove these bandages and replace with a daily dry dressing.  -For mild to moderate pain use Tylenol and Advil.  For breakthrough pain use Norco as necessary.  -Return to see Dr. Stann Mainland in the office in 2 weeks for suture removal.  Call your surgeon if you experience:   1.  Fever over 101.0. 2.  Inability to urinate. 3.  Nausea and/or vomiting. 4.  Extreme swelling or bruising at the surgical site. 5.  Continued bleeding from the incision. 6.  Increased pain, redness or drainage from the incision. 7.  Problems related to your pain medication. 8.  Any problems and/or concerns  9. Any changes to circulation in he operative leg/foot.   Post Anesthesia Home Care Instructions  Activity: Get plenty of rest for the remainder of the day. A responsible individual must stay with you for 24 hours following the procedure.  For the next 24 hours, DO NOT: -Drive a car -Paediatric nurse -Drink alcoholic beverages -Take any medication unless instructed by your physician -Make any legal decisions or sign important papers.  Meals: Start with liquid foods such as gelatin or soup. Progress to regular foods as tolerated. Avoid greasy, spicy, heavy foods. If nausea and/or vomiting occur, drink only clear liquids until the nausea and/or vomiting subsides. Call your physician if vomiting continues.  Special Instructions/Symptoms: Your throat may feel dry or sore from the anesthesia or the breathing tube placed in your throat during surgery. If this causes discomfort, gargle with warm salt water. The discomfort should disappear within 24 hours.

## 2021-03-15 ENCOUNTER — Encounter (HOSPITAL_BASED_OUTPATIENT_CLINIC_OR_DEPARTMENT_OTHER): Payer: Self-pay | Admitting: Orthopedic Surgery

## 2021-03-20 DIAGNOSIS — D485 Neoplasm of uncertain behavior of skin: Secondary | ICD-10-CM | POA: Diagnosis not present

## 2021-03-20 DIAGNOSIS — L209 Atopic dermatitis, unspecified: Secondary | ICD-10-CM | POA: Diagnosis not present

## 2021-03-20 DIAGNOSIS — D2239 Melanocytic nevi of other parts of face: Secondary | ICD-10-CM | POA: Diagnosis not present

## 2021-03-23 ENCOUNTER — Telehealth: Payer: Self-pay | Admitting: Hematology and Oncology

## 2021-03-23 NOTE — Telephone Encounter (Signed)
Patient's home INR 2.3 yesterday evening. Will continue same dose of Coumadin 6 mg M,W,F and 5 mg the rest of days. She is taking turmeric 500 mg daily. Will repeat INR in 2 weeks-August 1st. Patient verbalizes understanding. She knows to contact us with any concerns.

## 2021-03-26 ENCOUNTER — Encounter: Payer: Self-pay | Admitting: Family Medicine

## 2021-03-26 DIAGNOSIS — E1122 Type 2 diabetes mellitus with diabetic chronic kidney disease: Secondary | ICD-10-CM | POA: Diagnosis not present

## 2021-03-26 DIAGNOSIS — Z4789 Encounter for other orthopedic aftercare: Secondary | ICD-10-CM | POA: Diagnosis not present

## 2021-04-01 ENCOUNTER — Other Ambulatory Visit: Payer: Self-pay | Admitting: Cardiology

## 2021-04-01 ENCOUNTER — Encounter: Payer: Self-pay | Admitting: Hematology and Oncology

## 2021-04-03 LAB — SPECIMEN STATUS REPORT

## 2021-04-03 LAB — HGB A1C W/O EAG: Hgb A1c MFr Bld: 7.2 % — ABNORMAL HIGH (ref 4.8–5.6)

## 2021-04-05 ENCOUNTER — Telehealth: Payer: Self-pay | Admitting: Hematology and Oncology

## 2021-04-05 NOTE — Telephone Encounter (Signed)
Home INR 2.4 today. Patient will continue same coumadin '6mg'$  M,W,F, and 5 mg rest of days. Will repeat home INR on 8/15.

## 2021-04-09 ENCOUNTER — Ambulatory Visit (INDEPENDENT_AMBULATORY_CARE_PROVIDER_SITE_OTHER): Payer: Medicare PPO

## 2021-04-09 ENCOUNTER — Other Ambulatory Visit: Payer: Self-pay

## 2021-04-09 DIAGNOSIS — M810 Age-related osteoporosis without current pathological fracture: Secondary | ICD-10-CM

## 2021-04-09 MED ORDER — DENOSUMAB 60 MG/ML ~~LOC~~ SOSY
60.0000 mg | PREFILLED_SYRINGE | Freq: Once | SUBCUTANEOUS | Status: AC
  Administered 2021-04-09: 60 mg via SUBCUTANEOUS

## 2021-04-11 ENCOUNTER — Other Ambulatory Visit: Payer: Self-pay | Admitting: Family Medicine

## 2021-04-20 ENCOUNTER — Other Ambulatory Visit: Payer: Self-pay

## 2021-04-20 ENCOUNTER — Ambulatory Visit (INDEPENDENT_AMBULATORY_CARE_PROVIDER_SITE_OTHER): Payer: Medicare PPO

## 2021-04-20 ENCOUNTER — Telehealth: Payer: Self-pay | Admitting: Nurse Practitioner

## 2021-04-20 DIAGNOSIS — Z794 Long term (current) use of insulin: Secondary | ICD-10-CM

## 2021-04-20 DIAGNOSIS — I10 Essential (primary) hypertension: Secondary | ICD-10-CM | POA: Diagnosis not present

## 2021-04-20 DIAGNOSIS — E1140 Type 2 diabetes mellitus with diabetic neuropathy, unspecified: Secondary | ICD-10-CM

## 2021-04-20 NOTE — Telephone Encounter (Signed)
Let's lower her Toujeo to 10 units SQ nightly and continue her current doses of Novolog 4-7 units TID with meals if glucose is above 90 and she is eating. This adjustment should help avoid night time lows but still provide her some coverage for the afternoons when she tends to run a bit higher.

## 2021-04-20 NOTE — Telephone Encounter (Signed)
Pt called to give her readings and to see if her insulin needs to be adjusted.  8/13   1:01am --69 10:49am 139 3:32p 123 5:10p 171 5:56p 227 6:32p 186 7:13p 261 8:23p 53 8:55p 65 9:16p 69 10:14p 157   8/14  5:13a 63 11:06am 143 1:26p 106 2:28p 99 4:13p 176 7:23p 241 9:25p 124 10:32p 209 10:47p 271  8/15  9:51am 127 10:44am 243 2:27p 150 3:36 251 4:26 368 5:00 HIGH 8:05 208 8:30p 255 10:42 170   8/16  2:17am 69 2:31am 65 2:44am 69 10:11am 126 10:39am 141 1:17p 158

## 2021-04-20 NOTE — Patient Instructions (Addendum)
Visit Information   Goals Addressed   None    Patient Care Plan: [facility] pharmacy care plan     Problem Identified: htn, hld, dm   Priority: High  Onset Date: 01/01/2021     Long-Range Goal: Disease State Management   Start Date: 01/01/2021  Expected End Date: 01/01/2022  Recent Progress: On track  Priority: High  Note:   Current Barriers:  Unable to achieve control of low blood sugar   Pharmacist Clinical Goal(s):  Patient will achieve control of diabetes as evidenced by a1c and blood sugar through collaboration with PharmD and provider.   Interventions: 1:1 collaboration with Cox, Kirsten, MD regarding development and update of comprehensive plan of care as evidenced by provider attestation and co-signature Inter-disciplinary care team collaboration (see longitudinal plan of care) Comprehensive medication review performed; medication list updated in electronic medical record  Hypertension (BP goal <130/80) -Controlled -Current treatment: Lisinopril 40 mg bid  Furosemide 40 mg bid  -Medications previously tried: amlodipine, valsartan -Current home readings: BP - 179/85 puse of 70. 150,83, 174/81, 190/78 181/73, 172/92, 170/87  -Current dietary habits: patient trying to reduce carbohydrate intake and eating balanced meals.  -Current exercise habits: limited exercise  -Denies hypotensive/hypertensive symptoms -Educated on BP goals and benefits of medications for prevention of heart attack, stroke and kidney damage; Daily salt intake goal < 2300 mg; Importance of home blood pressure monitoring; -Counseled to monitor BP at home daily, document, and provide log at future appointments -Counseled on diet and exercise extensively Recommended to consider resuming amlodipine 5 mg daily if [name] agrees.   Hyperlipidemia: (LDL goal < 70) -Controlled -Current treatment: atorvastatin 40 mg daily  -Medications previously tried: none reported  -Current dietary patterns: working on  stabilizing blood sugar  -Current exercise habits: limited due to pain -Educated on Cholesterol goals;  Benefits of statin for ASCVD risk reduction; -Counseled on diet and exercise extensively Recommended to continue current medication  Diabetes (A1c goal <7%) -Not ideally controlled -Current medications: freestyle libre 2  novolog 4-7 units before meals Toujeo 20 units daily -Medications previously tried: trulicity, metformin, actoplus met  -Current home glucose readings fasting glucose: 100-180 with occasion readings in the 60s post prandial glucose: 180-300 Overnight blood sugar: Patient cotinues to go low overnight and early am times some nights.  -Reports hypoglycemic/hyperglycemic symptoms -Current meal patterns:  Varies. Patient working to limit carbohydrates and balance meals with protein.   -Current exercise: limited -Educated on A1c and blood sugar goals; Complications of diabetes including kidney damage, retinal damage, and cardiovascular disease; Prevention and management of hypoglycemic episodes; Continuous glucose monitoring; Carbohydrate counting and/or plate method -Counseled to check feet daily and get yearly eye exams -Counseled on diet and exercise extensively Pharmacist called endocrinology and left message about patient's overnight low blood sugar readings requesting recommendation. Patient states if her blood sugar is 200 at bedtime she is less likely to have hypoglycemia overnight. She reports 6 out of 7 nights she is up with low blood sugar.    Patient Goals/Self-Care Activities Patient will:  - take medications as prescribed focus on medication adherence by using pill box check glucose with [name] throughout the day, document, and provide at future appointments check blood pressure daily, document, and provide at future appointments  Follow Up Plan: Telephone follow up appointment with care management team member scheduled for:07/2021      Patient  verbalizes understanding of instructions provided today and agrees to view in [place].  Telephone follow up appointment with  pharmacy team member scheduled for: 07/2021 with pharmacist  Burnice Logan, [facility]

## 2021-04-20 NOTE — Telephone Encounter (Signed)
Left a message requesting a return call to the office. 

## 2021-04-20 NOTE — Progress Notes (Signed)
Chronic Care Management Pharmacy Note  04/20/2021 Name:  Pamela [name] MRN:  [id number] DOB:  12/24/1945  Plan Updates:  Patient reports blood pressure running above goal. BP - 179/85 puse of 70. 150,83, 174/81, 190/78 181/73, 172/92, 170/87 mmHg. Pharmacist will consult with [name] on best option. Patient was previously prescribed amlodipine - recommend considering restarting amlodipine 5 mg daily if [name] agrees.  Patient reports significant nocturnal hypoglycemia. Her Libre wakes her up at least once most nights. She is hoping something can be done to her regimen to improve control. She is taking Toujeo 20 units daily and 4-7 units before meals (sliding scale per endocrinology). Patient reports that she is not exercising but has tried to improve diet. Pharmacist called and left message with endocrinologist for guidance. Patient and pharmacist phone numbers left.  Pharmacy team checking on pen needle issue with pharmacist. Pen needles dispensing 100 pen needles for 25 days. Patient reports that she will be out before pharmacy says they can fill it.  Diet and exercise. No exercise but has been very busy with moving things out for flooring replaced. Diet she has cut out bread.  Reducing carbohydrates in diet: Boiled eggs, string cheese for breakfast with 1 piece of toast. Supper typically meat and a vegetable. Occasional tomato sandwich.   Subjective: Pamela [name] is an 75 y.o. year old female who is a primary patient of Cox, Kirsten, MD.  The CCM team was consulted for assistance with disease management and care coordination needs.    Engaged with patient by telephone for follow up visit in response to provider referral for pharmacy case management and/or care coordination services.   Consent to Services:  The patient was given information about Chronic Care Management services, agreed to services, and gave verbal consent prior to initiation of services.  Please  see initial visit note for detailed documentation.   Patient Care Team: [name], MD as PCP - General (Family Medicine) Berniece Salines, DO as PCP - Cardiology (Cardiology) Marice Potter, MD as Consulting Physician (Oncology) Ishmael Holter, Emily (Optometry) Ortho, [facility] (Specialist) Tanda Rockers, MD as Consulting Physician (Pulmonary Disease) Rozetta Nunnery, MD as Consulting Physician (Otolaryngology) Berniece Salines, DO as Consulting Physician (Cardiology) Renato Shin, MD as Consulting Physician (Endocrinology) Gavin Pound, MD as Consulting Physician (Rheumatology) Armbruster, Carlota Raspberry, MD as Consulting Physician (Gastroenterology) Burnice Logan, [facility] as Pharmacist (Pharmacist)  Recent office visits: 04/09/2021 - Prolia shot given.  03/10/2021 - continue current medications.  03/01/2021 - mammogram.  02/04/2021 - [place] booster.  12/09/2020 - Glucose 129, kidney tests improving, liver tests normal, potassium normal Recent consult visits: 02/08/2021 - insulin had to be increased to 35 units during steroid injections. Reduce back to 20 units daily. Encouraged exercise and healthy eating.  02/03/2021 - hematology - normal hemoglobin.  01/25/2021 - bp well controlled. Discussed endocrinology in [facility] referral.  01/22/2021 - She is advised to lower her dose of Toujeo to 20 units and start taking it before bed (instead of in the morning).  She is also advised to change the way she takes her Novolog to 4-7 units TID with meals if glucose is above 90 and she is eating.  Specific instructions on how to titrate her insulin dose based on glucose readings given to patient in writing.  She demonstrated her ability to interpret the SSI chart properly with me today. Hospital visits: 03/12/2021 - hardware removal.   Objective:  Lab Results  Component Value Date   CREATININE 1.24 (H) 03/10/2021   BUN 52 (H) 03/10/2021   GFR 57.98 (L) 03/10/2020   GFRNONAA 61 09/11/2020    GFRAA 70 09/11/2020   NA 138 03/10/2021   K 4.2 03/10/2021   CALCIUM 9.7 03/10/2021   CO2 24 03/10/2021   GLUCOSE 42 (L) 03/10/2021    Lab Results  Component Value Date/Time   HGBA1C 7.2 (H) 03/10/2021 11:11 AM   HGBA1C 9.8 (H) 11/12/2020 10:32 AM   GFR 57.98 (L) 03/10/2020 04:46 PM   GFR 61.14 02/05/2019 12:26 PM   MICROALBUR 10 07/29/2020 12:01 PM    Last diabetic Eye exam: No results found for: HMDIABEYEEXA  Last diabetic Foot exam: No results found for: HMDIABFOOTEX   Lab Results  Component Value Date   CHOL 136 03/10/2021   HDL 64 03/10/2021   LDLCALC 60 03/10/2021   TRIG 58 03/10/2021   CHOLHDL 2.1 03/10/2021    Hepatic Function Latest Ref Rng & Units 03/10/2021 02/03/2021 12/09/2020  Total Protein 6.0 - 8.5 g/dL 6.7 - 6.2  Albumin 3.7 - 4.7 g/dL 4.3 3.8 3.9  AST 0 - 40 IU/L 26 37(A) 23  ALT 0 - 32 IU/L 21 41(A) 22  Alk Phosphatase 44 - 121 IU/L 60 60 73  Total Bilirubin 0.0 - 1.2 mg/dL 0.5 - 0.4    Lab Results  Component Value Date/Time   TSH 2.020 11/14/2019 12:14 PM   TSH 1.24 02/05/2019 12:26 PM   FREET4 1.23 01/21/2013 12:47 PM    CBC Latest Ref Rng & Units 03/10/2021 02/03/2021 11/12/2020  WBC 3.4 - 10.8 x10E3/uL 7.2 5.1 8.7  Hemoglobin 11.1 - 15.9 g/dL 13.2 12.5 13.2  Hematocrit 34.0 - 46.6 % 39.0 37 40.4  Platelets 150 - 450 x10E3/uL 258 206 279    No results found for: VD25OH  Clinical ASCVD: Yes  The ASCVD Risk score Mikey Bussing DC Jr., et al., 2013) failed to calculate for the following reasons:   The patient has a prior MI or stroke diagnosis    Depression screen [facility] 2/9 03/10/2021 11/12/2020 06/07/2020  Decreased Interest 0 0 0  Down, Depressed, Hopeless 0 0 0  PHQ - 2 Score 0 0 0  Altered sleeping - 0 -  Tired, decreased energy - 0 -  Change in appetite - 0 -  Feeling bad or failure about yourself  - 0 -  Trouble concentrating - 0 -  Moving slowly or fidgety/restless - 0 -  Suicidal thoughts - 0 -  PHQ-9 Score - 0 -  Difficult doing work/chores -  Not difficult at all -      Social History   Tobacco Use  Smoking Status Never  Smokeless Tobacco Never   BP Readings from Last 3 Encounters:  03/12/21 (!) 173/65  03/10/21 (!) 120/56  02/08/21 (!) 164/72   Pulse Readings from Last 3 Encounters:  03/12/21 69  03/10/21 74  02/08/21 61   Wt Readings from Last 3 Encounters:  03/12/21 181 lb 11.2 oz (82.4 kg)  03/10/21 181 lb (82.1 kg)  02/08/21 186 lb (84.4 kg)   BMI Readings from Last 3 Encounters:  03/12/21 30.71 kg/m  03/10/21 31.07 kg/m  02/08/21 31.93 kg/m    Assessment/Interventions: Review of patient past medical history, allergies, medications, health status, including review of consultants reports, laboratory and other test data, was performed as part of comprehensive evaluation and provision of chronic care management services.   SDOH:  (Social Determinants of Health) assessments and  interventions performed: Yes  SDOH Screenings   Alcohol Screen: Not on file  Depression (PHQ2-9): Low Risk    PHQ-2 Score: 0  Financial Resource Strain: Not on file  Food Insecurity: No Food Insecurity   Worried About Charity fundraiser in the Last Year: Never true   Ran Out of Food in the Last Year: Never true  Housing: Low Risk    Last Housing Risk Score: 0  Physical Activity: Not on file  Social Connections: Not on file  Stress: Not on file  Tobacco Use: Low Risk    Smoking Tobacco Use: Never   Smokeless Tobacco Use: Never  Transportation Needs: No Transportation Needs   Lack of Transportation (Medical): No   Lack of Transportation (Non-Medical): No    CCM Care Plan  Allergies  Allergen Reactions   Ketek [Telithromycin] Nausea And Vomiting and Rash   Loxapine Succinate Hives   Naproxen Sodium Anaphylaxis and Hives   Amoxapine And Related Hives   Darvon Nausea And Vomiting   Belsomra [Suvorexant] Other (See Comments)    "Nightmares"   Cymbalta [Duloxetine Hcl] Other (See Comments)    "Blackouts"    Gabapentin Other (See Comments)    Blurry vision   Lyrica [Pregabalin]     Makes her sedated   Amoxicillin Rash   Propoxyphene Nausea And Vomiting    Medications Reviewed Today     Reviewed by Burnice Logan, [facility] (Pharmacist) on 04/20/21 at 1120  Med List Status: <None>   Medication Order Taking? Sig Documenting Provider Last Dose Status Informant  acetaminophen (TYLENOL) 500 MG tablet [id number] Yes Take 500-1,000 mg by mouth every 6 (six) hours as needed (for pain.). [provider] Taking Active Self  albuterol (VENTOLIN HFA) 108 (90 Base) MCG/ACT inhaler [id number] Yes Inhale 1-2 puffs into the lungs every 6 (six) hours as needed for wheezing or shortness of breath. [provider] Taking Active Self  ASPIRIN LOW DOSE 81 MG EC tablet [id number] Yes TAKE 1 TABLET(81 MG) BY MOUTH DAILY Tobb, Kardie, DO Taking Active   atorvastatin (LIPITOR) 40 MG tablet [id number] Yes Take 1 tablet (40 mg total) by mouth daily at 12 noon.  Patient taking differently: Take 40 mg by mouth daily with lunch.   Cox, Kirsten, MD Taking Active Self  azelastine (ASTELIN) 137 MCG/SPRAY nasal spray [id number] Yes Place 2 sprays into the nose daily as needed for rhinitis or allergies. [provider] Taking Active Self  B Complex-C (B-COMPLEX WITH VITAMIN C) tablet [id number] Yes Take 1 tablet by mouth daily with lunch. [provider] Taking Active Self  Cholecalciferol (VITAMIN D3) 50 MCG (2000 UT) TABS [id number] Yes Take 2,000 Units by mouth daily with lunch.  [provider] Taking Active Self  Continuous Blood Gluc Receiver (FREESTYLE LIBRE 2 READER) [place] [id number] Yes 1 each by Does not apply route 4 (four) times daily - after meals and at bedtime. E11.40 CoxElnita Maxwell, MD Taking Active   Continuous Blood Gluc Sensor (FREESTYLE LIBRE 2 SENSOR) [place] [id number] Yes 2 each by Does not apply route every 14 (fourteen) days. E11.40 CoxElnita Maxwell, MD Taking Active   estradiol  (ESTRACE) 0.1 MG/GM vaginal cream [id number] Yes Place 1 Applicatorful vaginally 2 (two) times a week. Uses twice weekly [provider] Taking Active Self  ferrous sulfate 325 (65 FE) MG tablet [id number] Yes Take 325 mg by mouth daily with lunch. [provider] Taking Active Self  furosemide (LASIX) 20 MG tablet [id number] Yes Take 1 tablet (  20 mg total) by mouth daily.  Patient taking differently: Take 40 mg by mouth 2 (two) times daily.   Berniece Salines, DO Taking Active Self  HYDROcodone-acetaminophen (NORCO/VICODIN) 5-325 MG tablet [id number] Yes Take 1 tablet by mouth every 4 (four) hours as needed for moderate pain. Nicholes Stairs, MD Taking Active   insulin aspart (NOVOLOG FLEXPEN) 100 UNIT/ML FlexPen [id number] Yes Inject 4-7 Units into the skin 3 (three) times daily with meals. If glucose above 90 and eating.  Follow SSI Brita Romp, NP Taking Active Self  insulin glargine, 1 Unit Dial, (TOUJEO) 300 UNIT/ML Solostar Pen [id number]  Inject 20 Units into the skin at bedtime. Brita Romp, NP  Active Self  Insulin Pen Needle (BD PEN NEEDLE NANO 2ND GEN) 32G X 4 MM MISC [id number]  USE DAILY AS DIRECTED Cox, Kirsten, MD  Active   ipratropium-albuterol (DUONEB) 0.5-2.5 (3) MG/3ML SOLN [id number]  Take 3 mLs by nebulization 4 (four) times daily as needed (wheezing/shortness of breath). [provider]  Active Self  lisinopril (ZESTRIL) 20 MG tablet [id number] Yes Take 2 tablets (40 mg total) by mouth 2 (two) times daily. Cox, Kirsten, MD Taking Active Self  magnesium oxide (MAG-OX) 400 (240 Mg) MG tablet [id number]  TAKE 2 TABLETS(800 MG) BY MOUTH TWICE DAILY  Patient taking differently: Take 800 mg by mouth 2 (two) times daily.   Lillard Anes, MD  Active Self  Menthol-Methyl Salicylate [facility] PAIN RELIEF [facility]) [facility] [id number] Yes Apply 1 patch topically daily as needed (pain.). [provider] Taking Active Self  methocarbamol (ROBAXIN)  500 MG tablet [id number] Yes Take 500 mg by mouth every 8 (eight) hours as needed for muscle spasms. [provider] Taking Active Self  ondansetron (ZOFRAN) 4 MG tablet [id number] Yes Take 1 tablet (4 mg total) by mouth daily as needed for nausea or vomiting. Nicholes Stairs, MD Taking Active   ondansetron (ZOFRAN-ODT) 8 MG disintegrating tablet [id number] Yes Take 8 mg by mouth every 8 (eight) hours as needed for nausea or vomiting.  [provider] Taking Active Self  pantoprazole (PROTONIX) 40 MG tablet [id number] Yes Take 1 tablet (40 mg total) by mouth every morning. Cox, Kirsten, MD Taking Active Self  polyethylene glycol (MIRALAX / GLYCOLAX) 17 g packet [id number] Yes Take 17 g by mouth as needed. Uses 1-2 times weekly [provider] Taking Active Self  senna-docusate (SENOKOT-S) 8.6-50 MG tablet [id number] Yes Take 1 tablet by mouth at bedtime. [provider] Taking Active Self  sodium chloride (OCEAN) 0.65 % SOLN nasal spray [id number] Yes Place 1-2 sprays into both nostrils 4 (four) times daily as needed for congestion. [provider] Taking Active Self  trimethoprim (TRIMPEX) 100 MG tablet [id number] Yes Take 100 mg by mouth at bedtime. [provider] Taking Active Self  Vibegron (GEMTESA PO) [id number] Yes Take by mouth at bedtime. [provider] Taking Active Self  warfarin (COUMADIN) 1 MG tablet [id number] Yes Take 1 tablet (1 mg total) by mouth See admin instructions. Take as directed Marvia Pickles, PA-C Taking Active Self  warfarin (COUMADIN) 5 MG tablet [id number] Yes TAKE 1 TABLET BY MOUTH EVERY DAY  Patient taking differently: Take by mouth as directed.   Marvia Pickles, PA-C Taking Active Self            Patient Active Problem List   Diagnosis Date Noted   Anemia in chronic kidney disease 02/03/2021   Type 2 diabetes mellitus with vascular disease (  [place]) 12/09/2020   Chronic diastolic congestive heart  failure ([place]) 12/09/2020   Type 2 diabetes mellitus with stage 3 chronic kidney disease ([place]) 12/09/2020   PVC's (premature ventricular contractions)    TIA (transient ischemic attack)    Neck pain 06/11/2020   Anemia    Asthma    Bilateral leg cramps    Chronic anticoagulation    Chronic constipation    Complication of anesthesia    DJD (degenerative joint disease)    Dyspnea    GERD (gastroesophageal reflux disease)    Heart murmur    HTN (hypertension)    Hypercholesteremia    Obesity    Pneumonia    PONV (postoperative nausea and vomiting)    Sleep apnea    Stroke [facility])    Encounter for orthopedic follow-up care 03/10/2020   Closed bimalleolar fracture of left ankle 02/26/2020   Hyponatremia 02/06/2019   Solitary pulmonary nodule on lung CT 02/06/2019   Spondylolisthesis, lumbar region 08/09/2018   Low back strain 08/09/2018   Osteoarthritis of right knee 07/06/2018   Low back pain 07/06/2018   Pain in right knee 07/06/2018   Atrial fibrillation [I48.91] 10/01/2014   DVT (deep venous thrombosis) ([place]) 07/30/2013   Transient ischemic attack 07/30/2013   Protein S deficiency ([place]) 03/03/2013   Long term (current) use of anticoagulants 03/03/2013   Dyspnea on exertion 03/03/2013   DOE (dyspnea on exertion) 03/03/2013   S/P cardiac catheterization 02/2013   Ventricular premature beats 05/23/2011   Dizziness 05/23/2011   Hypertension 05/23/2011    Immunization History  Administered Date(s) Administered   Fluad Quad(high Dose 65+) 05/26/2020   Influenza,inj,Quad PF,6+ Mos 06/04/2013   PFIZER Comirnaty(Gray Top)Covid-19 Tri-Sucrose Vaccine 02/04/2021   PFIZER(Purple Top)SARS-COV-2 Vaccination 09/22/2019, 10/12/2019, 06/16/2020   Pneumococcal Conjugate-13 07/30/2014   Pneumococcal Polysaccharide-23 03/31/2008   Tdap 07/25/2011   Zoster, Live 03/31/2008, 06/03/2011    Conditions to be addressed/monitored:  Hypertension, Hyperlipidemia and Diabetes  Care Plan :  ccm pharmacy care plan  Updates made by Burnice Logan, [place] since 04/20/2021 12:00 AM     Problem: htn, hld, dm   Priority: High  Onset Date: 01/01/2021     Long-Range Goal: Disease State Management   Start Date: 01/01/2021  Expected End Date: 01/01/2022  Recent Progress: On track  Priority: High  Note:   Current Barriers:  Unable to achieve control of low blood sugar   Pharmacist Clinical Goal(s):  Patient will achieve control of diabetes as evidenced by a1c and blood sugar through collaboration with PharmD and provider.   Interventions: 1:1 collaboration with Cox, Kirsten, MD regarding development and update of comprehensive plan of care as evidenced by provider attestation and co-signature Inter-disciplinary care team collaboration (see longitudinal plan of care) Comprehensive medication review performed; medication list updated in electronic medical record  Hypertension (BP goal <130/80) -Controlled -Current treatment: Lisinopril 40 mg bid  Furosemide 40 mg bid  -Medications previously tried: amlodipine, valsartan -Current home readings: BP - 179/85 puse of 70. 150,83, 174/81, 190/78 181/73, 172/92, 170/87  -Current dietary habits: patient trying to reduce carbohydrate intake and eating balanced meals.  -Current exercise habits: limited exercise  -Denies hypotensive/hypertensive symptoms -Educated on BP goals and benefits of medications for prevention of heart attack, stroke and kidney damage; Daily salt intake goal < 2300 mg; Importance of home blood pressure monitoring; -Counseled to monitor BP at home daily, document, and provide log at future appointments -Counseled on diet and exercise extensively Recommended to consider resuming amlodipine  5 mg daily if [name] agrees.   Hyperlipidemia: (LDL goal < 70) -Controlled -Current treatment: atorvastatin 40 mg daily  -Medications previously tried: none reported  -Current dietary patterns: working on stabilizing blood  sugar  -Current exercise habits: limited due to pain -Educated on Cholesterol goals;  Benefits of statin for ASCVD risk reduction; -Counseled on diet and exercise extensively Recommended to continue current medication  Diabetes (A1c goal <7%) -Not ideally controlled -Current medications: freestyle libre 2  novolog 4-7 units before meals Toujeo 20 units daily -Medications previously tried: trulicity, metformin, actoplus met  -Current home glucose readings fasting glucose: 100-180 with occasion readings in the 60s post prandial glucose: 180-300 Overnight blood sugar: Patient cotinues to go low overnight and early am times some nights.  -Reports hypoglycemic/hyperglycemic symptoms -Current meal patterns:  Varies. Patient working to limit carbohydrates and balance meals with protein.   -Current exercise: limited -Educated on A1c and blood sugar goals; Complications of diabetes including kidney damage, retinal damage, and cardiovascular disease; Prevention and management of hypoglycemic episodes; Continuous glucose monitoring; Carbohydrate counting and/or plate method -Counseled to check feet daily and get yearly eye exams -Counseled on diet and exercise extensively Pharmacist called endocrinology and left message about patient's overnight low blood sugar readings requesting recommendation. Patient states if her blood sugar is 200 at bedtime she is less likely to have hypoglycemia overnight. She reports 6 out of 7 nights she is up with low blood sugar.    Patient Goals/Self-Care Activities Patient will:  - take medications as prescribed focus on medication adherence by using pill box check glucose with [name] throughout the day, document, and provide at future appointments check blood pressure daily, document, and provide at future appointments  Follow Up Plan: Telephone follow up appointment with care management team member scheduled for:07/2021       Medication Assistance:  None required.  Patient affirms current coverage meets needs.  Patient's preferred pharmacy is:  [id number] [address], [address] AT [address] [place] [address] Phone: [phone] Fax: [place], [place] [facility] [address] [place] [address] Phone: [phone] Fax: [phone]  Uses pill box? Yes Pt endorses 100% compliance  We discussed: Current pharmacy is preferred with insurance plan and patient is satisfied with pharmacy services Patient decided to: Continue current medication management strategy  Care Plan and Follow Up Patient Decision:  Patient agrees to Care Plan and Follow-up.  Plan: Telephone follow up appointment with care management team member scheduled for:  07/2021

## 2021-04-21 NOTE — Telephone Encounter (Signed)
Pt returning your call

## 2021-04-21 NOTE — Telephone Encounter (Signed)
Discussed with pt, understanding voiced. 

## 2021-04-22 ENCOUNTER — Other Ambulatory Visit: Payer: Self-pay | Admitting: Legal Medicine

## 2021-04-22 DIAGNOSIS — D6859 Other primary thrombophilia: Secondary | ICD-10-CM | POA: Diagnosis not present

## 2021-04-22 DIAGNOSIS — Z7901 Long term (current) use of anticoagulants: Secondary | ICD-10-CM | POA: Diagnosis not present

## 2021-04-22 DIAGNOSIS — I10 Essential (primary) hypertension: Secondary | ICD-10-CM

## 2021-04-26 ENCOUNTER — Telehealth: Payer: Self-pay

## 2021-04-26 MED ORDER — AMLODIPINE BESYLATE 5 MG PO TABS: 5.0000 mg | ORAL_TABLET | Freq: Every day | ORAL | 0 refills | Status: DC

## 2021-04-26 NOTE — Addendum Note (Signed)
Addended by: Donette Larry B on: 04/26/2021 10:28 AM   Modules accepted: Orders

## 2021-04-26 NOTE — Chronic Care Management (AMB) (Signed)
Chronic Care Management Pharmacy Assistant   Name: Pamela [name]  MRN: [id number] DOB: 11/12/1945  Reason for Encounter: Follow Up CCM Call   Recent office visits:  No visits noted  Recent consult visits:  04/20/21-Telephone Encounter- Pamela Pigg, NP decreased Toujeo to 10 units nightly  Hospital visits:  None in previous 6 months  Medications: Outpatient Encounter Medications as of 04/26/2021  Medication Sig   acetaminophen (TYLENOL) 500 MG tablet Take 500-1,000 mg by mouth every 6 (six) hours as needed (for pain.).   albuterol (VENTOLIN HFA) 108 (90 Base) MCG/ACT inhaler Inhale 1-2 puffs into the lungs every 6 (six) hours as needed for wheezing or shortness of breath.   amLODipine (NORVASC) 5 MG tablet Take 1 tablet (5 mg total) by mouth daily.   ASPIRIN LOW DOSE 81 MG EC tablet TAKE 1 TABLET(81 MG) BY MOUTH DAILY   atorvastatin (LIPITOR) 40 MG tablet Take 1 tablet (40 mg total) by mouth daily at 12 noon. (Patient taking differently: Take 40 mg by mouth daily with lunch.)   azelastine (ASTELIN) 137 MCG/SPRAY nasal spray Place 2 sprays into the nose daily as needed for rhinitis or allergies.   B Complex-C (B-COMPLEX WITH VITAMIN C) tablet Take 1 tablet by mouth daily with lunch.   Cholecalciferol (VITAMIN D3) 50 MCG (2000 UT) TABS Take 2,000 Units by mouth daily with lunch.    Continuous Blood Gluc Receiver (FREESTYLE LIBRE 2 READER) DEVI 1 each by Does not apply route 4 (four) times daily - after meals and at bedtime. E11.40   Continuous Blood Gluc Sensor (FREESTYLE LIBRE 2 SENSOR) MISC 2 each by Does not apply route every 14 (fourteen) days. 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 Uses twice weekly   ferrous sulfate 325 (65 FE) MG tablet Take 325 mg by mouth daily with lunch.   furosemide (LASIX) 20 MG tablet Take 1 tablet (20 mg total) by mouth daily. (Patient taking differently: Take 40 mg by  mouth 2 (two) times daily.)   HYDROcodone-acetaminophen (NORCO/VICODIN) 5-325 MG tablet Take 1 tablet by mouth every 4 (four) hours as needed for moderate pain.   insulin aspart (NOVOLOG FLEXPEN) 100 UNIT/ML FlexPen Inject 4-7 Units into the skin 3 (three) times daily with meals. If glucose above 90 and eating.  Follow SSI   insulin glargine, 1 Unit Dial, (TOUJEO) 300 UNIT/ML Solostar Pen Inject 20 Units into the skin at bedtime.   Insulin Pen Needle (BD PEN NEEDLE NANO 2ND GEN) 32G X 4 MM MISC USE DAILY AS DIRECTED   ipratropium-albuterol (DUONEB) 0.5-2.5 (3) MG/3ML SOLN Take 3 mLs by nebulization 4 (four) times daily as needed (wheezing/shortness of breath).   lisinopril (ZESTRIL) 20 MG tablet TAKE 2 TABLETS(40 MG) BY MOUTH TWICE DAILY   magnesium oxide (MAG-OX) 400 (240 Mg) MG tablet TAKE 2 TABLETS(800 MG) BY MOUTH TWICE DAILY (Patient taking differently: Take 800 mg by mouth 2 (two) times daily.)   Menthol-Methyl Salicylate (SALONPAS PAIN RELIEF PATCH) PTCH Apply 1 patch topically daily as needed (pain.).   methocarbamol (ROBAXIN) 500 MG tablet Take 500 mg by mouth every 8 (eight) hours as needed for muscle spasms.   ondansetron (ZOFRAN) 4 MG tablet Take 1 tablet (4 mg total) by mouth daily as needed for nausea or vomiting.   ondansetron (ZOFRAN-ODT) 8 MG disintegrating tablet Take 8 mg by mouth every 8 (eight) hours as needed for nausea or vomiting.  pantoprazole (PROTONIX) 40 MG tablet Take 1 tablet (40 mg total) by mouth every morning.   polyethylene glycol (MIRALAX / GLYCOLAX) 17 g packet Take 17 g by mouth as needed. Uses 1-2 times weekly   senna-docusate (SENOKOT-S) 8.6-50 MG tablet Take 1 tablet by mouth at bedtime.   sodium chloride (OCEAN) 0.65 % SOLN nasal spray Place 1-2 sprays into both nostrils 4 (four) times daily as needed for congestion.   trimethoprim (TRIMPEX) 100 MG tablet Take 100 mg by mouth at bedtime.   Vibegron (GEMTESA PO) Take by mouth at bedtime.   warfarin  (COUMADIN) 1 MG tablet Take 1 tablet (1 mg total) by mouth See admin instructions. Take as directed   warfarin (COUMADIN) 5 MG tablet TAKE 1 TABLET BY MOUTH EVERY DAY (Patient taking differently: Take by mouth as directed.)   No facility-administered encounter medications on file as of 04/26/2021.   I called and informed Pamela [name] that per the directions of CPP and PCP she is to restart taking amlodipine 5 mg daily. A script was sent to [facility] and Pamela [name] was made aware.   Wilford Sports CPA,CMA

## 2021-04-27 ENCOUNTER — Telehealth: Payer: Self-pay | Admitting: Hematology and Oncology

## 2021-04-27 NOTE — Telephone Encounter (Signed)
Spoke with patient regarding home INR of 2 on August 18th.  She had done a home INR on August 18th which was 1.8.  She continues Coumadin 6 mg on Monday, Wednesday and Friday and 5 mg the rest of the days.  She denies any changes in her medications.  She will continue the same dose of Coumadin.  We will plan to repeat a home INR on September 6th.  The patient verbalizes understanding.

## 2021-05-01 ENCOUNTER — Other Ambulatory Visit: Payer: Self-pay | Admitting: Nurse Practitioner

## 2021-05-01 DIAGNOSIS — E1140 Type 2 diabetes mellitus with diabetic neuropathy, unspecified: Secondary | ICD-10-CM

## 2021-05-01 DIAGNOSIS — Z794 Long term (current) use of insulin: Secondary | ICD-10-CM

## 2021-05-02 DIAGNOSIS — Z79899 Other long term (current) drug therapy: Secondary | ICD-10-CM | POA: Diagnosis not present

## 2021-05-02 DIAGNOSIS — S92344A Nondisplaced fracture of fourth metatarsal bone, right foot, initial encounter for closed fracture: Secondary | ICD-10-CM | POA: Diagnosis not present

## 2021-05-02 DIAGNOSIS — M25571 Pain in right ankle and joints of right foot: Secondary | ICD-10-CM | POA: Diagnosis not present

## 2021-05-02 DIAGNOSIS — S92341A Displaced fracture of fourth metatarsal bone, right foot, initial encounter for closed fracture: Secondary | ICD-10-CM | POA: Diagnosis not present

## 2021-05-02 DIAGNOSIS — X501XXA Overexertion from prolonged static or awkward postures, initial encounter: Secondary | ICD-10-CM | POA: Diagnosis not present

## 2021-05-02 DIAGNOSIS — G8911 Acute pain due to trauma: Secondary | ICD-10-CM | POA: Diagnosis not present

## 2021-05-02 DIAGNOSIS — M79671 Pain in right foot: Secondary | ICD-10-CM | POA: Diagnosis not present

## 2021-05-02 DIAGNOSIS — M7731 Calcaneal spur, right foot: Secondary | ICD-10-CM | POA: Diagnosis not present

## 2021-05-03 NOTE — Chronic Care Management (AMB) (Signed)
Chronic Care Management Pharmacy Assistant   Name: Pamela [name]  MRN: [id number] DOB: 1945-12-21  Reason for Encounter: Hypertension/ BP Follow Up Call  Recent office visits:  No visits noted   Recent consult visits:  No visits noted   Hospital visits:  None in previous 6 months  Medications: Outpatient Encounter Medications as of 04/26/2021  Medication Sig   acetaminophen (TYLENOL) 500 MG tablet Take 500-1,000 mg by mouth every 6 (six) hours as needed (for pain.).   albuterol (VENTOLIN HFA) 108 (90 Base) MCG/ACT inhaler Inhale 1-2 puffs into the lungs every 6 (six) hours as needed for wheezing or shortness of breath.   amLODipine (NORVASC) 5 MG tablet Take 1 tablet (5 mg total) by mouth daily.   ASPIRIN LOW DOSE 81 MG EC tablet TAKE 1 TABLET(81 MG) BY MOUTH DAILY   atorvastatin (LIPITOR) 40 MG tablet Take 1 tablet (40 mg total) by mouth daily at 12 noon. (Patient taking differently: Take 40 mg by mouth daily with lunch.)   azelastine (ASTELIN) 137 MCG/SPRAY nasal spray Place 2 sprays into the nose daily as needed for rhinitis or allergies.   B Complex-C (B-COMPLEX WITH VITAMIN C) tablet Take 1 tablet by mouth daily with lunch.   Cholecalciferol (VITAMIN D3) 50 MCG (2000 UT) TABS Take 2,000 Units by mouth daily with lunch.    Continuous Blood Gluc Receiver (FREESTYLE LIBRE 2 READER) DEVI 1 each by Does not apply route 4 (four) times daily - after meals and at bedtime. E11.40   Continuous Blood Gluc Sensor (FREESTYLE LIBRE 2 SENSOR) MISC 2 each by Does not apply route every 14 (fourteen) days. 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 Uses twice weekly   ferrous sulfate 325 (65 FE) MG tablet Take 325 mg by mouth daily with lunch.   furosemide (LASIX) 20 MG tablet Take 1 tablet (20 mg total) by mouth daily. (Patient taking differently: Take 40 mg by mouth 2 (two) times daily.)   HYDROcodone-acetaminophen  (NORCO/VICODIN) 5-325 MG tablet Take 1 tablet by mouth every 4 (four) hours as needed for moderate pain.   insulin aspart (NOVOLOG FLEXPEN) 100 UNIT/ML FlexPen Inject 4-7 Units into the skin 3 (three) times daily with meals. If glucose above 90 and eating.  Follow SSI   insulin glargine, 1 Unit Dial, (TOUJEO) 300 UNIT/ML Solostar Pen Inject 20 Units into the skin at bedtime.   Insulin Pen Needle (BD PEN NEEDLE NANO 2ND GEN) 32G X 4 MM MISC USE DAILY AS DIRECTED   ipratropium-albuterol (DUONEB) 0.5-2.5 (3) MG/3ML SOLN Take 3 mLs by nebulization 4 (four) times daily as needed (wheezing/shortness of breath).   lisinopril (ZESTRIL) 20 MG tablet TAKE 2 TABLETS(40 MG) BY MOUTH TWICE DAILY   magnesium oxide (MAG-OX) 400 (240 Mg) MG tablet TAKE 2 TABLETS(800 MG) BY MOUTH TWICE DAILY (Patient taking differently: Take 800 mg by mouth 2 (two) times daily.)   Menthol-Methyl Salicylate (SALONPAS PAIN RELIEF PATCH) PTCH Apply 1 patch topically daily as needed (pain.).   methocarbamol (ROBAXIN) 500 MG tablet Take 500 mg by mouth every 8 (eight) hours as needed for muscle spasms.   ondansetron (ZOFRAN) 4 MG tablet Take 1 tablet (4 mg total) by mouth daily as needed for nausea or vomiting.   ondansetron (ZOFRAN-ODT) 8 MG disintegrating tablet Take 8 mg by mouth every 8 (eight) hours as needed for nausea or vomiting.    pantoprazole (PROTONIX) 40 MG tablet  Take 1 tablet (40 mg total) by mouth every morning.   polyethylene glycol (MIRALAX / GLYCOLAX) 17 g packet Take 17 g by mouth as needed. Uses 1-2 times weekly   senna-docusate (SENOKOT-S) 8.6-50 MG tablet Take 1 tablet by mouth at bedtime.   sodium chloride (OCEAN) 0.65 % SOLN nasal spray Place 1-2 sprays into both nostrils 4 (four) times daily as needed for congestion.   trimethoprim (TRIMPEX) 100 MG tablet Take 100 mg by mouth at bedtime.   Vibegron (GEMTESA PO) Take by mouth at bedtime.   warfarin (COUMADIN) 1 MG tablet Take 1 tablet (1 mg total) by mouth See  admin instructions. Take as directed   warfarin (COUMADIN) 5 MG tablet TAKE 1 TABLET BY MOUTH EVERY DAY (Patient taking differently: Take by mouth as directed.)   No facility-administered encounter medications on file as of 04/26/2021.     Recent Office Vitals: BP Readings from Last 3 Encounters:  03/12/21 (!) 173/65  03/10/21 (!) 120/56  02/08/21 (!) 164/72   Pulse Readings from Last 3 Encounters:  03/12/21 69  03/10/21 74  02/08/21 61    Wt Readings from Last 3 Encounters:  03/12/21 181 lb 11.2 oz (82.4 kg)  03/10/21 181 lb (82.1 kg)  02/08/21 186 lb (84.4 kg)     Kidney Function Lab Results  Component Value Date/Time   CREATININE 1.24 (H) 03/10/2021 11:11 AM   CREATININE 1.2 (A) 02/03/2021 12:00 AM   CREATININE 1.01 (H) 12/09/2020 07:58 AM   GFR 57.98 (L) 03/10/2020 04:46 PM   GFRNONAA 61 09/11/2020 11:23 AM   GFRAA 70 09/11/2020 11:23 AM    BMP Latest Ref Rng & Units 03/10/2021 02/03/2021 12/09/2020  Glucose 65 - 99 mg/dL 42(L) - 129(H)  BUN 8 - 27 mg/dL 52(H) 43(A) 20  Creatinine 0.57 - 1.00 mg/dL 1.24(H) 1.2(A) 1.01(H)  BUN/Creat Ratio 12 - 28 42(H) - 20  Sodium 134 - 144 mmol/L 138 138 135  Potassium 3.5 - 5.2 mmol/L 4.2 4.3 5.1  Chloride 96 - 106 mmol/L 101 103 97  CO2 20 - 29 mmol/L 24 30(A) 23  Calcium 8.7 - 10.3 mg/dL 9.7 8.5(A) 9.1    I spoke with Pamela [name] this morning and she is on vacation in [place]. Yesterday was her first day of vacation and she ending up breaking her foot leading to a hospital visit. She was put in a boot and given medication for the pain. She will be calling this morning to make an appointment to see her orthopedic doctor, however she will still be in [place] for another week. Pamela [name] also complains about having bad cramping in her legs and feet that pre dated her injury yesterday which she is unsure of the cause.   Current antihypertensive regimen:  Lisinopril 20 mg- take two tabs twice daily  Amlodipine 5 mg- take one tab  daily    Patient verbally confirms she is taking the above medications as directed. Yes  How often are you checking your Blood Pressure?  Patient stated she checks her BP regularly   she checks her blood pressure in the morning before taking her medication.  Current home BP readings:   DATE:             BP               PULSE 08/26   171/76  65 08/28    174/82  88 08/29  140/73   69  Wrist or arm cuff: arm  Caffeine intake:daily 2-3 cups decaf  Salt intake: no  OTC medications including pseudoephedrine or NSAIDs? Tylenol/ Aspirin  Any readings above 180/120? No  What recent interventions/DTPs have been made by any provider to improve Blood Pressure control since last CPP Visit:  Restarted amlodipine 5 mg daily   Any recent hospitalizations or ED visits since last visit with CPP?  05/02/21-Patient reported  [facility] in [place] - Right foot 4th metatarsal. 5th metatarsal indicated hairline fracture. Hydrocodone- acetaminophen 5-325 mg prescribed   What diet changes have been made to improve Blood Pressure Control?  Patient has not hand any changes to her diet   What exercise is being done to improve your Blood Pressure Control? Patient has not added any activities for BP control  Adherence Review: Is the patient currently on ACE/ARB medication? YES Does the patient have >5 day gap between last estimated fill dates?  Amlodipine 5 mg- 90 DS last filled  04/26/21  Care Gaps: Last annual wellness visit? Upcoming 06/29/21  Star Rating Drugs:  Medication:   Last Fill: Day Supply Atorvastatin 40 mg  02/13/21 90 DS Lisinopril 20 mg  01/26/21 90 DS  Wilford Sports CPA, CMA

## 2021-05-07 DIAGNOSIS — M79671 Pain in right foot: Secondary | ICD-10-CM | POA: Diagnosis not present

## 2021-05-07 DIAGNOSIS — S92344A Nondisplaced fracture of fourth metatarsal bone, right foot, initial encounter for closed fracture: Secondary | ICD-10-CM | POA: Diagnosis not present

## 2021-05-07 DIAGNOSIS — S92343A Displaced fracture of fourth metatarsal bone, unspecified foot, initial encounter for closed fracture: Secondary | ICD-10-CM

## 2021-05-07 HISTORY — DX: Displaced fracture of fourth metatarsal bone, unspecified foot, initial encounter for closed fracture: S92.343A

## 2021-05-11 ENCOUNTER — Other Ambulatory Visit: Payer: Self-pay | Admitting: Hematology and Oncology

## 2021-05-11 ENCOUNTER — Other Ambulatory Visit: Payer: Self-pay

## 2021-05-11 ENCOUNTER — Ambulatory Visit (INDEPENDENT_AMBULATORY_CARE_PROVIDER_SITE_OTHER): Payer: Medicare PPO | Admitting: Nurse Practitioner

## 2021-05-11 ENCOUNTER — Encounter: Payer: Self-pay | Admitting: Nurse Practitioner

## 2021-05-11 VITALS — BP 128/84 | HR 81 | Ht 64.0 in | Wt 184.6 lb

## 2021-05-11 DIAGNOSIS — Z7901 Long term (current) use of anticoagulants: Secondary | ICD-10-CM

## 2021-05-11 DIAGNOSIS — E1140 Type 2 diabetes mellitus with diabetic neuropathy, unspecified: Secondary | ICD-10-CM

## 2021-05-11 DIAGNOSIS — N1831 Chronic kidney disease, stage 3a: Secondary | ICD-10-CM | POA: Diagnosis not present

## 2021-05-11 DIAGNOSIS — D6859 Other primary thrombophilia: Secondary | ICD-10-CM

## 2021-05-11 DIAGNOSIS — E782 Mixed hyperlipidemia: Secondary | ICD-10-CM | POA: Diagnosis not present

## 2021-05-11 DIAGNOSIS — Z794 Long term (current) use of insulin: Secondary | ICD-10-CM | POA: Diagnosis not present

## 2021-05-11 DIAGNOSIS — E1122 Type 2 diabetes mellitus with diabetic chronic kidney disease: Secondary | ICD-10-CM | POA: Diagnosis not present

## 2021-05-11 DIAGNOSIS — I10 Essential (primary) hypertension: Secondary | ICD-10-CM | POA: Diagnosis not present

## 2021-05-11 MED ORDER — INSULIN ASPART FLEXPEN 100 UNIT/ML ~~LOC~~ SOPN: 4.0000 [IU] | PEN_INJECTOR | Freq: Three times a day (TID) | SUBCUTANEOUS | 3 refills | Status: DC

## 2021-05-11 MED ORDER — INSULIN GLARGINE (1 UNIT DIAL) 300 UNIT/ML ~~LOC~~ SOPN: 15.0000 [IU] | PEN_INJECTOR | Freq: Every day | SUBCUTANEOUS | 1 refills | Status: DC

## 2021-05-11 MED ORDER — BD PEN NEEDLE NANO 2ND GEN 32G X 4 MM MISC: 3 refills | Status: DC

## 2021-05-11 NOTE — Progress Notes (Signed)
Endocrinology Consult Note       05/11/2021, 10:40 AM   Subjective:    Patient ID: Pamela [name], female    DOB: May 24, 1946.  Pamela [name] is being seen in consultation for management of currently uncontrolled symptomatic diabetes requested by  Pamela Brome, MD.   Past Medical History:  Diagnosis Date   Anticoagulated on Coumadin    chronic--- managed by hematology/ oncology,   Asthma, mild intermittent    followed by pcp--- per pt last exacerbation winter 2021 w/ acute bronchitis   Bilateral leg cramps    Chronic constipation    CKD (chronic kidney disease), stage III ([place])    DDD (degenerative disc disease), cervical    w/ spondylosis,  per pt last steroid injection 06/ 2022   DDD (degenerative disc disease), lumbosacral    Dyspnea    occasionally   GERD (gastroesophageal reflux disease)    History of cardiac murmur as a child    History of DVT of lower extremity    left lower extremity in 1980s, fell when bowling   History of pulmonary embolus (PE) 1993   per pt left lung post op 2 wks cholecystectomy   History of rheumatic fever as a child    per last echo 12-31-2019 no valvular issues   History of TIA (transient ischemic attack)    2014 and 2018 or 2019,  per pt no residual   HTN (hypertension)    followed by pcp   Hyperlipidemia    IDA (iron deficiency anemia)    Macular degeneration of both eyes    Mild obstructive sleep apnea    per pt dx 2017 tried to uses cpap but intolerant   Mixed incontinence urge and stress    urologist--- dr Pamela [name]   OA (osteoarthritis) 07/06/2018   Osteoporosis    PAF (paroxysmal atrial fibrillation) ([place]) 10/01/2014   cardiologist--- dr Godfrey Pick tobb;   cardiac cath 02-18-2013 normal coronaries arteries, ef 50%, cath done since echo showed ef 30-35%; nucleat stress study 04/ 2020 normal , normal echo 04/ 2021,  event monitor 09-14-2020 rare  ST/AT variable block   PONV (postoperative nausea and vomiting)    Protein S deficiency ([place])    followed by hemotology/ oncology-- dr d. Bobby Rumpf ([facility]) dx 1980s;  prior DVT left lower leg 1980s and left lung PE 1993; chronic  coumadin since 1980s   PVC's (premature ventricular contractions)    followed by cardiology   Solitary pulmonary nodule on lung CT 02/06/2019   First noted 01/13/2014 > no change as of 12/21/2018   Spondylolisthesis, lumbar region 08/09/2018   Type 2 diabetes mellitus treated with insulin ([place])    endocrilogist--- Pamela Saunders NP     (03-10-2021  pt continuously checks blood sugar throughout the day w/ Libre, fasting sugar --- 69--200)   Wears glasses    Wears hearing aid in both ears     Past Surgical History:  Procedure Laterality Date   CARDIAC CATHETERIZATION  02/18/2013   @ [facility]  by Dr Martinique;  normal coronaries w/ preserved LVF, ef 50%;   previous cath 2001 normal ef 65%   CARPAL TUNNEL RELEASE  Bilateral 1994   CATARACT EXTRACTION W/ INTRAOCULAR LENS IMPLANT Bilateral 2017   CHOLECYSTECTOMY, LAPAROSCOPIC  1993   COLONOSCOPY     FINGER SURGERY Left 2018   thumb   FOOT TENDON SURGERY Right    early 2000s   HARDWARE REMOVAL Left 03/12/2021   Procedure: HARDWARE REMOVAL;  Surgeon: Nicholes Stairs, MD;  [facility]: [facility];  Service: Orthopedics;  Laterality: Left;  60 MINS   ORIF ANKLE FRACTURE Left 02/26/2020   Procedure: OPEN REDUCTION INTERNAL FIXATION (ORIF) ANKLE FRACTURE;  Surgeon: Nicholes Stairs, MD;  Location: [facility];  Service: Orthopedics;  Laterality: Left;  90 mins   TONSILLECTOMY  1950   TOTAL KNEE ARTHROPLASTY Left 03/2005   TOTAL VAGINAL HYSTERECTOMY  1988   per pt still has ovaries    Social History   Socioeconomic History   Marital status: Married    Spouse name: Pamela [name]   Number of children: Not on file   Years of education: Not on file   Highest education level: Not on file   Occupational History   Not on file  Tobacco Use   Smoking status: Never   Smokeless tobacco: Never  Vaping Use   Vaping Use: Never used  Substance and Sexual Activity   Alcohol use: No   Drug use: Never   Sexual activity: Not on file  Other Topics Concern   Not on file  Social History Narrative   Lives with spouse in [place].  2 grown daughters.   Retired Glass blower/designer     Social Determinants of Radio broadcast assistant Strain: Not on file  Food Insecurity: No Food Insecurity   Worried About Charity fundraiser in the Last Year: Never true   Arboriculturist in the Last Year: Never true  Transportation Needs: No Transportation Needs   Lack of Transportation (Medical): No   Lack of Transportation (Non-Medical): No  Physical Activity: Not on file  Stress: Not on file  Social Connections: Not on file    Family History  Problem Relation Age of Onset   Cirrhosis Mother    Antithrombin III deficiency Mother        multiple emboli   Ulcerative colitis Mother    Coronary artery disease Father    Heart attack Father    Hypertension Father    Protein S deficiency Sister        prior DVT   Protein S deficiency Daughter    Hypertension Sister    Hypertension Brother    Lung cancer Brother    Heart attack Brother    Heart attack Sister        age 74    Stroke Neg Hx    Colitis Neg Hx    Colon polyps Neg Hx    Esophageal cancer Neg Hx    Liver cancer Neg Hx    Pancreatic cancer Neg Hx    Rectal cancer Neg Hx    Stomach cancer Neg Hx     Outpatient Encounter Medications as of 05/11/2021  Medication Sig   acetaminophen (TYLENOL) 500 MG tablet Take 500-1,000 mg by mouth every 6 (six) hours as needed (for pain.).   albuterol (VENTOLIN HFA) 108 (90 Base) MCG/ACT inhaler Inhale 1-2 puffs into the lungs every 6 (six) hours as needed for wheezing or shortness of breath.   amLODipine (NORVASC) 5 MG tablet Take 1 tablet (5 mg total) by mouth daily.   ASPIRIN LOW DOSE 81 MG  EC tablet TAKE 1 TABLET(81 MG) BY MOUTH DAILY   atorvastatin (LIPITOR) 40 MG tablet Take 1 tablet (40 mg total) by mouth daily at 12 noon. (Patient taking differently: Take 40 mg by mouth daily with lunch.)   azelastine (ASTELIN) 137 MCG/SPRAY nasal spray Place 2 sprays into the nose daily as needed for rhinitis or allergies.   B Complex-C (B-COMPLEX WITH VITAMIN C) tablet Take 1 tablet by mouth daily with lunch.   Cholecalciferol (VITAMIN D3) 50 MCG (2000 UT) TABS Take 2,000 Units by mouth daily with lunch.    Continuous Blood Gluc Receiver (FREESTYLE LIBRE 2 READER) DEVI 1 each by Does not apply route 4 (four) times daily - after meals and at bedtime. E11.40   Continuous Blood Gluc Sensor (FREESTYLE LIBRE 2 SENSOR) MISC 2 each by Does not apply route every 14 (fourteen) days. 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 Uses twice weekly   ferrous sulfate 325 (65 FE) MG tablet Take 325 mg by mouth daily with lunch.   GEMTESA 75 MG TABS Take 1 tablet by mouth daily as needed.   HYDROcodone-acetaminophen (NORCO/VICODIN) 5-325 MG tablet Take 1 tablet by mouth every 4 (four) hours as needed for moderate pain.   ipratropium-albuterol (DUONEB) 0.5-2.5 (3) MG/3ML SOLN Take 3 mLs by nebulization 4 (four) times daily as needed (wheezing/shortness of breath).   lisinopril (ZESTRIL) 20 MG tablet TAKE 2 TABLETS(40 MG) BY MOUTH TWICE DAILY   magnesium oxide (MAG-OX) 400 (240 Mg) MG tablet TAKE 2 TABLETS(800 MG) BY MOUTH TWICE DAILY (Patient taking differently: Take 800 mg by mouth 2 (two) times daily.)   Menthol-Methyl Salicylate (SALONPAS PAIN RELIEF PATCH) PTCH Apply 1 patch topically daily as needed (pain.).   methocarbamol (ROBAXIN) 750 MG tablet Take 750 mg by mouth 3 (three) times daily.   ondansetron (ZOFRAN) 4 MG tablet Take 1 tablet (4 mg total) by mouth daily as needed for nausea or vomiting.   ondansetron (ZOFRAN-ODT) 8 MG disintegrating  tablet Take 8 mg by mouth every 8 (eight) hours as needed for nausea or vomiting.    pantoprazole (PROTONIX) 40 MG tablet Take 1 tablet (40 mg total) by mouth every morning.   polyethylene glycol (MIRALAX / GLYCOLAX) 17 g packet Take 17 g by mouth as needed. Uses 1-2 times weekly   pregabalin (LYRICA) 75 MG capsule pregabalin 75 mg capsule  TAKE 1 CAPSULE BY MOUTH TWICE DAILY   senna-docusate (SENOKOT-S) 8.6-50 MG tablet Take 1 tablet by mouth at bedtime.   sodium chloride (OCEAN) 0.65 % SOLN nasal spray Place 1-2 sprays into both nostrils 4 (four) times daily as needed for congestion.   traMADol-acetaminophen (ULTRACET) 37.5-325 MG tablet tramadol 37.5 mg-acetaminophen 325 mg tablet  TAKE 1 TABLET BY MOUTH THREE TIMES DAILY AS NEEDED   trimethoprim (TRIMPEX) 100 MG tablet Take 100 mg by mouth at bedtime.   Vibegron (GEMTESA PO) Take by mouth at bedtime.   warfarin (COUMADIN) 1 MG tablet Take 1 tablet (1 mg total) by mouth See admin instructions. Take as directed   warfarin (COUMADIN) 5 MG tablet TAKE 1 TABLET BY MOUTH EVERY DAY (Patient taking differently: Take by mouth as directed.)   [DISCONTINUED] Insulin Aspart FlexPen (NOVOLOG) 100 UNIT/ML INJECT 4 TO 7 UNITS UNDER THE SKIN THREE TIMES DAILY WITH MEALS, IF GLUCOSE ABOVE 90 AND EATING, FOLLOW SSI   [DISCONTINUED] insulin glargine, 1 Unit Dial, (TOUJEO) 300 UNIT/ML Solostar Pen Inject 20 Units into  the skin at bedtime. (Patient taking differently: Inject 10 Units into the skin at bedtime.)   [DISCONTINUED] Insulin Pen Needle (BD PEN NEEDLE NANO 2ND GEN) 32G X 4 MM MISC USE DAILY AS DIRECTED   furosemide (LASIX) 20 MG tablet Take 1 tablet (20 mg total) by mouth daily. (Patient taking differently: Take 40 mg by mouth 2 (two) times daily.)   Insulin Aspart FlexPen (NOVOLOG) 100 UNIT/ML Inject 4-10 Units into the skin 3 (three) times daily with meals.   insulin glargine, 1 Unit Dial, (TOUJEO) 300 UNIT/ML Solostar Pen Inject 15 Units into the skin  at bedtime.   Insulin Pen Needle (BD PEN NEEDLE NANO 2ND GEN) 32G X 4 MM MISC USE DAILY AS DIRECTED   methocarbamol (ROBAXIN) 500 MG tablet Take 500 mg by mouth every 8 (eight) hours as needed for muscle spasms. (Patient not taking: Reported on 05/11/2021)   No facility-administered encounter medications on file as of 05/11/2021.    ALLERGIES: Allergies  Allergen Reactions   Ketek [Telithromycin] Nausea And Vomiting and Rash   Loxapine Succinate Hives   Naproxen Sodium Anaphylaxis and Hives   Amoxapine And Related Hives   Darvon Nausea And Vomiting   Belsomra [Suvorexant] Other (See Comments)    "Nightmares"   Cymbalta [Duloxetine Hcl] Other (See Comments)    "Blackouts"   Gabapentin Other (See Comments)    Blurry vision   Lyrica [Pregabalin]     Makes her sedated   Amoxicillin Rash   Propoxyphene Nausea And Vomiting    VACCINATION STATUS: Immunization History  Administered Date(s) Administered   Fluad Quad(high Dose 65+) 05/26/2020   Influenza,inj,Quad PF,6+ Mos 06/04/2013   PFIZER Comirnaty(Gray Top)Covid-19 Tri-Sucrose Vaccine 02/04/2021   PFIZER(Purple Top)SARS-COV-2 Vaccination 09/22/2019, 10/12/2019, 06/16/2020   Pneumococcal Conjugate-13 07/30/2014   Pneumococcal Polysaccharide-23 03/31/2008   Tdap 07/25/2011   Zoster, Live 03/31/2008, 06/03/2011    Diabetes She presents for her follow-up diabetic visit. She has type 2 diabetes mellitus. Onset time: diagnosed at approx age of 25. Her disease course has been fluctuating. Hypoglycemia symptoms include nervousness/anxiousness, sweats and tremors. Associated symptoms include foot paresthesias. There are no hypoglycemic complications. Symptoms are stable. Diabetic complications include a CVA, heart disease, nephropathy and peripheral neuropathy. Risk factors for coronary artery disease include diabetes mellitus, dyslipidemia, family history, hypertension, post-menopausal and sedentary lifestyle. Current diabetic treatment  includes intensive insulin program. She is compliant with treatment most of the time. Her weight is fluctuating minimally. She is following a generally healthy diet. When asked about meal planning, she reported none. She has not had a previous visit with a dietitian. She rarely participates in exercise. Her home blood glucose trend is increasing steadily. (She presents today with her CGM and logs showing above target fasting and postprandial glycemic profile overall.  Her most recent A1c from 03/10/21 was 7.2%, improving from last visit of 9.8%.  She reports she recently broke her right foot and the pain medication they gave her causes her glucose to run higher.  She also reports intermittent steroid injections for neck and back issues which also cause fluctuations in her glucose.  Analysis of her CGM shows TIR 58%, TAR 42%, TBR 0%.) An ACE inhibitor/angiotensin II receptor blocker is being taken. She does not see a podiatrist.Eye exam is current.  Hyperlipidemia This is a chronic problem. The current episode started more than 1 year ago. The problem is controlled. Recent lipid tests were reviewed and are normal. Exacerbating diseases include chronic renal disease and diabetes. Factors aggravating her  hyperlipidemia include beta blockers and fatty foods. Current antihyperlipidemic treatment includes statins. The current treatment provides significant improvement of lipids. Compliance problems include adherence to exercise.  Risk factors for coronary artery disease include diabetes mellitus, dyslipidemia, hypertension, post-menopausal and a sedentary lifestyle.  Hypertension This is a chronic problem. The current episode started more than 1 year ago. The problem has been resolved since onset. The problem is controlled. Associated symptoms include sweats. There are no associated agents to hypertension. Risk factors for coronary artery disease include diabetes mellitus, dyslipidemia, family history, post-menopausal  state and sedentary lifestyle. Past treatments include beta blockers, diuretics and ACE inhibitors. The current treatment provides moderate improvement. There are no compliance problems.  Hypertensive end-organ damage includes kidney disease, CVA and heart failure. Identifiable causes of hypertension include chronic renal disease and sleep apnea.    Review of systems  Constitutional: + Minimally fluctuating body weight,  current Body mass index is 31.69 kg/m. , no fatigue, no subjective hyperthermia, no subjective hypothermia Eyes: no blurry vision, no xerophthalmia ENT: no sore throat, no nodules palpated in throat, no dysphagia/odynophagia, no hoarseness Cardiovascular: no chest pain, no shortness of breath, no palpitations, no leg swelling Respiratory: no cough, no shortness of breath Gastrointestinal: no nausea/vomiting/diarrhea Musculoskeletal: chronic neck and back pain; RLE in ortho boot due to recent fracture, walks with walker Skin: no rashes, no hyperemia Neurological: no tremors, no numbness, no tingling, no dizziness Psychiatric: no depression, no anxiety    Objective:     BP 128/84   Pulse 81   Ht '5\' 4"'$  (1.626 m)   Wt 184 lb 9.6 oz (83.7 kg)   BMI 31.69 kg/m   Wt Readings from Last 3 Encounters:  05/11/21 184 lb 9.6 oz (83.7 kg)  03/12/21 181 lb 11.2 oz (82.4 kg)  03/10/21 181 lb (82.1 kg)     BP Readings from Last 3 Encounters:  05/11/21 128/84  03/12/21 (!) 173/65  03/10/21 (!) 120/56       Physical Exam- Limited  Constitutional:  Body mass index is 31.69 kg/m. , not in acute distress, normal state of mind Eyes:  EOMI, no exophthalmos Neck: Supple Cardiovascular: RRR, no murmurs, rubs, or gallops, no edema Respiratory: Adequate breathing efforts, no crackles, rales, rhonchi, or wheezing Musculoskeletal: no gross deformities, strength intact in all four extremities, RLE in ortho boot, walks with walker Skin:  no rashes, no hyperemia Neurological: no  tremor with outstretched hands     CMP ( most recent) CMP     Component Value Date/Time   NA 138 03/10/2021 1111   K 4.2 03/10/2021 1111   CL 101 03/10/2021 1111   CO2 24 03/10/2021 1111   GLUCOSE 42 (L) 03/10/2021 1111   GLUCOSE 132 (H) 03/10/2020 1646   BUN 52 (H) 03/10/2021 1111   CREATININE 1.24 (H) 03/10/2021 1111   CALCIUM 9.7 03/10/2021 1111   PROT 6.7 03/10/2021 1111   ALBUMIN 4.3 03/10/2021 1111   AST 26 03/10/2021 1111   ALT 21 03/10/2021 1111   ALKPHOS 60 03/10/2021 1111   BILITOT 0.5 03/10/2021 1111   GFRNONAA 61 09/11/2020 1123   GFRAA 70 09/11/2020 1123     Diabetic Labs (most recent): Lab Results  Component Value Date   HGBA1C 7.2 (H) 03/10/2021   HGBA1C 9.8 (H) 11/12/2020   HGBA1C 8.6 (H) 08/13/2020     Lipid Panel ( most recent) Lipid Panel     Component Value Date/Time   CHOL 136 03/10/2021 1111   TRIG 58  03/10/2021 1111   HDL 64 03/10/2021 1111   CHOLHDL 2.1 03/10/2021 1111   LDLCALC 60 03/10/2021 1111   LABVLDL 12 03/10/2021 1111      Lab Results  Component Value Date   TSH 2.020 11/14/2019   TSH 1.24 02/05/2019   FREET4 1.23 01/21/2013           Assessment & Plan:   1) Type 2 diabetes mellitus with stage 3b chronic kidney disease, with long-term current use of insulin ([place])  She presents today with her CGM and logs showing above target fasting and postprandial glycemic profile overall.  Her most recent A1c from 03/10/21 was 7.2%, improving from last visit of 9.8%.  She reports she recently broke her right foot and the pain medication they gave her causes her glucose to run higher.  She also reports intermittent steroid injections for neck and back issues which also cause fluctuations in her glucose.  Analysis of her CGM shows TIR 58%, TAR 42%, TBR 0%.  - Athanasia Deshone Sherba has currently uncontrolled symptomatic type 2 DM since 75 years of age.  -Recent labs reviewed.  - I had a long discussion with her about the  progressive nature of diabetes and the pathology behind its complications. -her diabetes is complicated by CKD stage 3, CVA and she remains at a high risk for more acute and chronic complications which include CAD, CVA, CKD, retinopathy, and neuropathy. These are all discussed in detail with her.  - Nutritional counseling repeated at each appointment due to patients tendency to fall back in to old habits.  - The patient admits there is a room for improvement in their diet and drink choices. -  Suggestion is made for the patient to avoid simple carbohydrates from their diet including Cakes, Sweet Desserts / Pastries, Ice Cream, Soda (diet and regular), Sweet Tea, Candies, Chips, Cookies, Sweet Pastries, Store Bought Juices, Alcohol in Excess of 1-2 drinks a day, Artificial Sweeteners, Coffee Creamer, and "Sugar-free" Products. This will help patient to have stable blood glucose profile and potentially avoid unintended weight gain.   - I encouraged the patient to switch to unprocessed or minimally processed complex starch and increased protein intake (animal or plant source), fruits, and vegetables.   - Patient is advised to stick to a routine mealtimes to eat 3 meals a day and avoid unnecessary snacks (to snack only to correct hypoglycemia).  - I have approached her with the following individualized plan to manage  her diabetes and patient agrees:   -Based on her hyperglycemia, she is advised to increase her Toujeo to 15 units SQ nightly and adjust her Novolog to 4-10 units SQ TID with meals if glucose is above 90 and she is eating (Specific instructions on how to titrate insulin dosage based on glucose readings given to patient in writing).   -she is encouraged to continue monitoring glucose 4 times daily using her CGM, before meals and before bed, and to call the clinic if she has readings less than 70 or greater than 200 for 3 tests in a row.  - she is warned not to take insulin without proper  monitoring per orders. - Adjustment parameters are given to her for hypo and hyperglycemia in writing.  - Specific targets for  A1c;  LDL, HDL,  and Triglycerides were discussed with the patient.  2) Blood Pressure /Hypertension:  her blood pressure is controlled to target.   she is advised to continue her current medications including Lasix 20  mg po daily, Lisinopril 40 mg po twice daily.  3) Lipids/Hyperlipidemia:    Review of her recent lipid panel from 11/12/20 showed controlled LDL at 68 .  she  is advised to continue Lipitor 40 mg daily at bedtime.  Side effects and precautions discussed with her.  4)  Weight/Diet:  her Body mass index is 31.69 kg/m.  -   she is not a candidate for major weight loss.  Exercise, and detailed carbohydrates information provided  -  detailed on discharge instructions.  5) Chronic Care/Health Maintenance: -she is on ACEI/ARB and Statin medications and is encouraged to initiate and continue to follow up with Ophthalmology, Dentist, Podiatrist at least yearly or according to recommendations, and advised to stay away from smoking. I have recommended yearly flu vaccine and pneumonia vaccine at least every 5 years; moderate intensity exercise for up to 150 minutes weekly; and sleep for at least 7 hours a day.  - she is advised to maintain close follow up with [facility], Elnita Maxwell, MD for primary care needs, as well as her other providers for optimal and coordinated care.       I spent 40 minutes in the care of the patient today including review of labs from [facility], Lipids, Thyroid Function, Hematology (current and previous including abstractions from other facilities); face-to-face time discussing  her blood glucose readings/logs, discussing hypoglycemia and hyperglycemia episodes and symptoms, medications doses, her options of short and long term treatment based on the latest standards of care / guidelines;  discussion about incorporating lifestyle medicine;  and  documenting the encounter.    Please refer to Patient Instructions for Blood Glucose Monitoring and Insulin/Medications Dosing Guide"  in media tab for additional information. Please  also refer to " Patient Self Inventory" in the Media  tab for reviewed elements of pertinent patient history.  Eulene Brixley Dioguardi participated in the discussions, expressed understanding, and voiced agreement with the above plans.  All questions were answered to her satisfaction. she is encouraged to contact clinic should she have any questions or concerns prior to her return visit.   Follow up plan: - Return in about 3 months (around 08/10/2021) for Diabetes F/U with A1c in office, No previsit labs, Bring meter and logs.   Rayetta Pigg, [facility] [address] [place], [place] [address] Phone: [phone] Fax: [phone]  05/11/2021, 10:40 AM

## 2021-05-11 NOTE — Patient Instructions (Signed)

## 2021-05-12 ENCOUNTER — Telehealth: Payer: Self-pay | Admitting: Hematology and Oncology

## 2021-05-12 DIAGNOSIS — N952 Postmenopausal atrophic vaginitis: Secondary | ICD-10-CM | POA: Diagnosis not present

## 2021-05-12 DIAGNOSIS — N3946 Mixed incontinence: Secondary | ICD-10-CM | POA: Diagnosis not present

## 2021-05-12 DIAGNOSIS — N390 Urinary tract infection, site not specified: Secondary | ICD-10-CM | POA: Diagnosis not present

## 2021-05-12 NOTE — Telephone Encounter (Signed)
Telephoned patient regarding her home INR of 2.2 from September 6.  As this is therapeutic, we will continue the same given 6 mg Monday, Wednesday and Friday and 5 mg the rest of the days. She will repeat a home INR on September 19th.  The patient verbalized understanding.

## 2021-05-13 ENCOUNTER — Ambulatory Visit (INDEPENDENT_AMBULATORY_CARE_PROVIDER_SITE_OTHER): Payer: Medicare PPO

## 2021-05-13 ENCOUNTER — Other Ambulatory Visit: Payer: Self-pay | Admitting: Family Medicine

## 2021-05-13 ENCOUNTER — Encounter: Payer: Self-pay | Admitting: Family Medicine

## 2021-05-13 DIAGNOSIS — M810 Age-related osteoporosis without current pathological fracture: Secondary | ICD-10-CM

## 2021-05-13 DIAGNOSIS — S92901A Unspecified fracture of right foot, initial encounter for closed fracture: Secondary | ICD-10-CM

## 2021-05-13 DIAGNOSIS — Z23 Encounter for immunization: Secondary | ICD-10-CM

## 2021-05-24 ENCOUNTER — Telehealth: Payer: Self-pay | Admitting: Family Medicine

## 2021-05-24 NOTE — Telephone Encounter (Signed)
   Pamela [name] has been scheduled for the following appointment:  WHAT: BONE DENSITY WHERE: [facility] DATE: 07/16/21 TIME: 8:00 AM ARRIVAL TIME  Patient has been made aware.

## 2021-05-25 ENCOUNTER — Telehealth: Payer: Self-pay | Admitting: Hematology and Oncology

## 2021-05-25 ENCOUNTER — Encounter: Payer: Self-pay | Admitting: Hematology and Oncology

## 2021-05-25 LAB — POCT INR: INR: 3 (ref 2.0–3.0)

## 2021-05-25 NOTE — Telephone Encounter (Signed)
Spoke with patient regarding her home INR of 3 today.  She states she has not eaten as many greens as she normally does.  She denies bleeding.  I will have her continue the same Coumadin 6 mg Tuesday, Thursday, Saturday and 5 mg the rest of the days.  She will repeat a home INR on October 3rd.  The patient verbalizes understanding.

## 2021-05-28 DIAGNOSIS — H353131 Nonexudative age-related macular degeneration, bilateral, early dry stage: Secondary | ICD-10-CM | POA: Diagnosis not present

## 2021-06-03 ENCOUNTER — Telehealth: Payer: Self-pay

## 2021-06-03 NOTE — Telephone Encounter (Signed)
Patient called and stated she just used her last [name] order and its not working and sarabeth told her to call if she needed anything she has order more but they have not came in. Can you give her a call.

## 2021-06-04 ENCOUNTER — Telehealth: Payer: Self-pay

## 2021-06-04 NOTE — Progress Notes (Signed)
Chronic Care Management Pharmacy Assistant   Name: Pamela [name]  MRN: [id number] DOB: 09-03-1946   Reason for Encounter: F/U call for [facility] sensor    Medications: Outpatient Encounter Medications as of 06/04/2021  Medication Sig   acetaminophen (TYLENOL) 500 MG tablet Take 500-1,000 mg by mouth every 6 (six) hours as needed (for pain.).   albuterol (VENTOLIN HFA) 108 (90 Base) MCG/ACT inhaler Inhale 1-2 puffs into the lungs every 6 (six) hours as needed for wheezing or shortness of breath.   amLODipine (NORVASC) 5 MG tablet Take 1 tablet (5 mg total) by mouth daily.   ASPIRIN LOW DOSE 81 MG EC tablet TAKE 1 TABLET(81 MG) BY MOUTH DAILY   atorvastatin (LIPITOR) 40 MG tablet Take 1 tablet (40 mg total) by mouth daily at 12 noon. (Patient taking differently: Take 40 mg by mouth daily with lunch.)   azelastine (ASTELIN) 137 MCG/SPRAY nasal spray Place 2 sprays into the nose daily as needed for rhinitis or allergies.   B Complex-C (B-COMPLEX WITH VITAMIN C) tablet Take 1 tablet by mouth daily with lunch.   Cholecalciferol (VITAMIN D3) 50 MCG (2000 UT) TABS Take 2,000 Units by mouth daily with lunch.    Continuous Blood Gluc Receiver (FREESTYLE LIBRE 2 READER) DEVI 1 each by Does not apply route 4 (four) times daily - after meals and at bedtime. E11.40   Continuous Blood Gluc Sensor (FREESTYLE LIBRE 2 SENSOR) MISC 2 each by Does not apply route every 14 (fourteen) days. 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 Uses twice weekly   ferrous sulfate 325 (65 FE) MG tablet Take 325 mg by mouth daily with lunch.   furosemide (LASIX) 20 MG tablet Take 1 tablet (20 mg total) by mouth daily. (Patient taking differently: Take 40 mg by mouth 2 (two) times daily.)   GEMTESA 75 MG TABS Take 1 tablet by mouth daily as needed.   HYDROcodone-acetaminophen (NORCO/VICODIN) 5-325 MG tablet Take 1 tablet by mouth every 4 (four)  hours as needed for moderate pain.   Insulin Aspart FlexPen (NOVOLOG) 100 UNIT/ML Inject 4-10 Units into the skin 3 (three) times daily with meals.   insulin glargine, 1 Unit Dial, (TOUJEO) 300 UNIT/ML Solostar Pen Inject 15 Units into the skin at bedtime.   Insulin Pen Needle (BD PEN NEEDLE NANO 2ND GEN) 32G X 4 MM MISC USE DAILY AS DIRECTED   ipratropium-albuterol (DUONEB) 0.5-2.5 (3) MG/3ML SOLN Take 3 mLs by nebulization 4 (four) times daily as needed (wheezing/shortness of breath).   lisinopril (ZESTRIL) 20 MG tablet TAKE 2 TABLETS(40 MG) BY MOUTH TWICE DAILY   magnesium oxide (MAG-OX) 400 (240 Mg) MG tablet TAKE 2 TABLETS(800 MG) BY MOUTH TWICE DAILY (Patient taking differently: Take 800 mg by mouth 2 (two) times daily.)   Menthol-Methyl Salicylate (SALONPAS PAIN RELIEF PATCH) PTCH Apply 1 patch topically daily as needed (pain.).   methocarbamol (ROBAXIN) 500 MG tablet Take 500 mg by mouth every 8 (eight) hours as needed for muscle spasms. (Patient not taking: Reported on 05/11/2021)   methocarbamol (ROBAXIN) 750 MG tablet Take 750 mg by mouth 3 (three) times daily.   ondansetron (ZOFRAN) 4 MG tablet Take 1 tablet (4 mg total) by mouth daily as needed for nausea or vomiting.   ondansetron (ZOFRAN-ODT) 8 MG disintegrating tablet Take 8 mg by mouth every 8 (eight) hours as needed for nausea or vomiting.    pantoprazole (PROTONIX) 40  MG tablet Take 1 tablet (40 mg total) by mouth every morning.   polyethylene glycol (MIRALAX / GLYCOLAX) 17 g packet Take 17 g by mouth as needed. Uses 1-2 times weekly   pregabalin (LYRICA) 75 MG capsule pregabalin 75 mg capsule  TAKE 1 CAPSULE BY MOUTH TWICE DAILY   senna-docusate (SENOKOT-S) 8.6-50 MG tablet Take 1 tablet by mouth at bedtime.   sodium chloride (OCEAN) 0.65 % SOLN nasal spray Place 1-2 sprays into both nostrils 4 (four) times daily as needed for congestion.   traMADol-acetaminophen (ULTRACET) 37.5-325 MG tablet tramadol 37.5 mg-acetaminophen 325 mg  tablet  TAKE 1 TABLET BY MOUTH THREE TIMES DAILY AS NEEDED   trimethoprim (TRIMPEX) 100 MG tablet Take 100 mg by mouth at bedtime.   Vibegron (GEMTESA PO) Take by mouth at bedtime.   warfarin (COUMADIN) 1 MG tablet TAKE AS DIRECTED. SEE ADMIN INSTRUCTIONS.   warfarin (COUMADIN) 5 MG tablet TAKE 1 TABLET BY MOUTH EVERY DAY (Patient taking differently: Take by mouth as directed.)   No facility-administered encounter medications on file as of 06/04/2021.     I received a message that states:  "Patient called and stated she just used her last [name] order and its not working and sarabeth told her to call if she needed anything she has order more but they have not came in. Can you give her a call".  I followed up with the pt and gave them a call today and I spoke to the husband. They stated they called the supplier and explained that her last one ended up not working and she wasn't due for another supply until 10/14. The supplier is fedexing a new Libre sensor to the pt and asked for her to send the one not working back. So I just confirmed with them that they are receiving another one and the husband stated they are sending one in the mail.    [place]

## 2021-06-07 DIAGNOSIS — I10 Essential (primary) hypertension: Secondary | ICD-10-CM | POA: Diagnosis not present

## 2021-06-07 DIAGNOSIS — R0602 Shortness of breath: Secondary | ICD-10-CM | POA: Diagnosis not present

## 2021-06-07 DIAGNOSIS — R0789 Other chest pain: Secondary | ICD-10-CM | POA: Diagnosis not present

## 2021-06-08 ENCOUNTER — Encounter: Payer: Self-pay | Admitting: Hematology and Oncology

## 2021-06-08 DIAGNOSIS — D6859 Other primary thrombophilia: Secondary | ICD-10-CM | POA: Diagnosis not present

## 2021-06-08 DIAGNOSIS — Z7901 Long term (current) use of anticoagulants: Secondary | ICD-10-CM | POA: Diagnosis not present

## 2021-06-08 LAB — PROTIME-INR: INR: 2 — AB (ref 0.9–1.1)

## 2021-06-09 ENCOUNTER — Other Ambulatory Visit: Payer: Self-pay | Admitting: Cardiology

## 2021-06-09 DIAGNOSIS — S92344A Nondisplaced fracture of fourth metatarsal bone, right foot, initial encounter for closed fracture: Secondary | ICD-10-CM | POA: Diagnosis not present

## 2021-06-09 DIAGNOSIS — M79671 Pain in right foot: Secondary | ICD-10-CM | POA: Diagnosis not present

## 2021-06-09 NOTE — Telephone Encounter (Signed)
Lasix instruction from 20mg  bid to qd per Dr. Harriet Masson using instruction dated 08/14/2020

## 2021-06-14 DIAGNOSIS — M5030 Other cervical disc degeneration, unspecified cervical region: Secondary | ICD-10-CM | POA: Diagnosis not present

## 2021-06-14 DIAGNOSIS — Z7901 Long term (current) use of anticoagulants: Secondary | ICD-10-CM | POA: Diagnosis not present

## 2021-06-14 DIAGNOSIS — M4316 Spondylolisthesis, lumbar region: Secondary | ICD-10-CM | POA: Diagnosis not present

## 2021-06-14 DIAGNOSIS — D6859 Other primary thrombophilia: Secondary | ICD-10-CM | POA: Diagnosis not present

## 2021-06-14 DIAGNOSIS — E1122 Type 2 diabetes mellitus with diabetic chronic kidney disease: Secondary | ICD-10-CM | POA: Diagnosis not present

## 2021-06-14 NOTE — Progress Notes (Signed)
Subjective:  Patient ID: Pamela [name], female    DOB: 11/09/1945  Age: 75 y.o. MRN: [id number]  Chief Complaint  Patient presents with   Diabetes    HPI Diabetes:  Complications: Has been experiencing Highs and lows. Glucose checking:Continuous Glucose monitor Glucose logs: Ranges 78-360 Hypoglycemia: Has had some low readings as well. Most recent A1C: Done 03/10/2021 results: 7.2% Current medications: Novolog Pen injector 4-10 units three times day with meals, Toujeo 15 units at bed time. Last Eye Exam: 05/28/2021. Foot checks: daily  Hyperlipidemia: Current medications: Atorvastatin 40mg  1 tablet once daily.  Hypertension: Complications: Current medications: Amlodipine 5mg  1 tablet once daily.  Atrial fibrillation: on coumadin (also has protein s deficiency.) Coumadin managed by hematology.  Diet: Fairly healthy, until last couple of weeks.  Exercise: no due to ankle boot. Dr. Doran Durand says poor healing. DEXA scheduled November 11th.    Current Outpatient Medications on File Prior to Visit  Medication Sig Dispense Refill   albuterol (VENTOLIN HFA) 108 (90 Base) MCG/ACT inhaler Inhale 1-2 puffs into the lungs every 6 (six) hours as needed for wheezing or shortness of breath.     amLODipine (NORVASC) 5 MG tablet Take 1 tablet (5 mg total) by mouth daily. 90 tablet 0   ASPIRIN LOW DOSE 81 MG EC tablet TAKE 1 TABLET(81 MG) BY MOUTH DAILY 90 tablet 3   atorvastatin (LIPITOR) 40 MG tablet Take 1 tablet (40 mg total) by mouth daily at 12 noon. (Patient taking differently: Take 40 mg by mouth daily with lunch.) 90 tablet 1   azelastine (ASTELIN) 137 MCG/SPRAY nasal spray Place 2 sprays into the nose daily as needed for rhinitis or allergies.     B Complex-C (B-COMPLEX WITH VITAMIN C) tablet Take 1 tablet by mouth daily with lunch.     Cholecalciferol (VITAMIN D3) 50 MCG (2000 UT) TABS Take 2,000 Units by mouth daily with lunch.      Continuous Blood Gluc Receiver  (FREESTYLE LIBRE 2 READER) DEVI 1 each by Does not apply route 4 (four) times daily - after meals and at bedtime. E11.40 1 each 0   Continuous Blood Gluc Sensor (FREESTYLE LIBRE 2 SENSOR) MISC 2 each by Does not apply route every 14 (fourteen) days. E11.40 6 each 3   estradiol (ESTRACE) 0.1 MG/GM vaginal cream Place 1 Applicatorful vaginally 2 (two) times a week. Uses twice weekly     ferrous sulfate 325 (65 FE) MG tablet Take 325 mg by mouth daily with lunch.     furosemide (LASIX) 20 MG tablet Take 1 tablet (20 mg total) by mouth daily. 90 tablet 1   HYDROcodone-acetaminophen (NORCO/VICODIN) 5-325 MG tablet Take 1 tablet by mouth every 4 (four) hours as needed for moderate pain. 20 tablet 0   Insulin Aspart FlexPen (NOVOLOG) 100 UNIT/ML Inject 4-10 Units into the skin 3 (three) times daily with meals. 15 mL 3   insulin glargine, 1 Unit Dial, (TOUJEO) 300 UNIT/ML Solostar Pen Inject 15 Units into the skin at bedtime. 135 mL 1   Insulin Pen Needle (BD PEN NEEDLE NANO 2ND GEN) 32G X 4 MM MISC USE DAILY AS DIRECTED 200 each 3   ipratropium-albuterol (DUONEB) 0.5-2.5 (3) MG/3ML SOLN Take 3 mLs by nebulization 4 (four) times daily as needed (wheezing/shortness of breath).  0   lisinopril (ZESTRIL) 20 MG tablet TAKE 2 TABLETS(40 MG) BY MOUTH TWICE DAILY 180 tablet 1   magnesium oxide (MAG-OX) 400 (240 Mg) MG tablet TAKE 2 TABLETS(800 MG)  BY MOUTH TWICE DAILY (Patient taking differently: Take 800 mg by mouth 2 (two) times daily.) 120 tablet 2   Menthol-Methyl Salicylate (SALONPAS PAIN RELIEF PATCH) PTCH Apply 1 patch topically daily as needed (pain.).     methocarbamol (ROBAXIN) 500 MG tablet Take 500 mg by mouth every 8 (eight) hours as needed for muscle spasms.     methocarbamol (ROBAXIN) 750 MG tablet Take 750 mg by mouth 3 (three) times daily.     ondansetron (ZOFRAN) 4 MG tablet Take 1 tablet (4 mg total) by mouth daily as needed for nausea or vomiting. 30 tablet 1   ondansetron (ZOFRAN-ODT) 8 MG  disintegrating tablet Take 8 mg by mouth every 8 (eight) hours as needed for nausea or vomiting.      pantoprazole (PROTONIX) 40 MG tablet Take 1 tablet (40 mg total) by mouth every morning. 90 tablet 3   polyethylene glycol (MIRALAX / GLYCOLAX) 17 g packet Take 17 g by mouth as needed. Uses 1-2 times weekly     pregabalin (LYRICA) 75 MG capsule pregabalin 75 mg capsule  TAKE 1 CAPSULE BY MOUTH TWICE DAILY     senna-docusate (SENOKOT-S) 8.6-50 MG tablet Take 1 tablet by mouth at bedtime.     sodium chloride (OCEAN) 0.65 % SOLN nasal spray Place 1-2 sprays into both nostrils 4 (four) times daily as needed for congestion.     trimethoprim (TRIMPEX) 100 MG tablet Take 100 mg by mouth at bedtime.     Vibegron (GEMTESA PO) Take by mouth at bedtime.     warfarin (COUMADIN) 1 MG tablet TAKE AS DIRECTED. SEE ADMIN INSTRUCTIONS. 120 tablet 2   warfarin (COUMADIN) 5 MG tablet TAKE 1 TABLET BY MOUTH EVERY DAY (Patient taking differently: Take by mouth as directed.) 90 tablet 1   No current facility-administered medications on file prior to visit.   Past Medical History:  Diagnosis Date   Anticoagulated on Coumadin    chronic--- managed by hematology/ oncology,   Asthma, mild intermittent    followed by pcp--- per pt last exacerbation winter 2021 w/ acute bronchitis   Bilateral leg cramps    Chronic constipation    CKD (chronic kidney disease), stage III ([place])    Closed bimalleolar fracture of left ankle 02/26/2020   DDD (degenerative disc disease), cervical    w/ spondylosis,  per pt last steroid injection 06/ 2022   DDD (degenerative disc disease), lumbosacral    DVT (deep venous thrombosis) ([place]) 07/30/2013   Dyspnea    occasionally   GERD (gastroesophageal reflux disease)    History of cardiac murmur as a child    History of DVT of lower extremity    left lower extremity in 1980s, fell when bowling   History of pulmonary embolus (PE) 1993   per pt left lung post op 2 wks cholecystectomy    History of rheumatic fever as a child    per last echo 12-31-2019 no valvular issues   History of TIA (transient ischemic attack)    2014 and 2018 or 2019,  per pt no residual   HTN (hypertension)    followed by pcp   Hyperlipidemia    IDA (iron deficiency anemia)    Macular degeneration of both eyes    Mild obstructive sleep apnea    per pt dx 2017 tried to uses cpap but intolerant   Mixed incontinence urge and stress    urologist--- dr Nila Nephew   OA (osteoarthritis) 07/06/2018   Osteoporosis    PAF (  paroxysmal atrial fibrillation) ([place]) 10/01/2014   cardiologist--- dr Godfrey Pick tobb;   cardiac cath 02-18-2013 normal coronaries arteries, ef 50%, cath done since echo showed ef 30-35%; nucleat stress study 04/ 2020 normal , normal echo 04/ 2021,  event monitor 09-14-2020 rare ST/AT variable block   PONV (postoperative nausea and vomiting)    Protein S deficiency ([place])    followed by hemotology/ oncology-- dr d. Bobby Rumpf ([facility]) dx 1980s;  prior DVT left lower leg 1980s and left lung PE 1993; chronic  coumadin since 1980s   PVC's (premature ventricular contractions)    followed by cardiology   S/P cardiac catheterization 02/2013   Normal coronaries; low normal EF at 50%   Solitary pulmonary nodule on lung CT 02/06/2019   First noted 01/13/2014 > no change as of 12/21/2018   Spondylolisthesis, lumbar region 08/09/2018   Stroke ([place])    TIA in 2018 or 2019   Transient ischemic attack 07/30/2013   Type 2 diabetes mellitus treated with insulin ([place])    endocrilogist--- whitney reardon NP     (03-10-2021  pt continuously checks blood sugar throughout the day w/ Libre, fasting sugar --- 69--200)   Wears glasses    Wears hearing aid in both ears    Past Surgical History:  Procedure Laterality Date   CARDIAC CATHETERIZATION  02/18/2013   @ [place]  by Dr Martinique;  normal coronaries w/ preserved LVF, ef 50%;   previous cath 2001 normal ef 65%   CARPAL TUNNEL RELEASE Bilateral 1994    CATARACT EXTRACTION W/ INTRAOCULAR LENS IMPLANT Bilateral 2017   CHOLECYSTECTOMY, LAPAROSCOPIC  1993   COLONOSCOPY     FINGER SURGERY Left 2018   thumb   FOOT TENDON SURGERY Right    early 2000s   HARDWARE REMOVAL Left 03/12/2021   Procedure: HARDWARE REMOVAL;  Surgeon: Nicholes Stairs, MD;  Location: [facility];  Service: Orthopedics;  Laterality: Left;  60 MINS   ORIF ANKLE FRACTURE Left 02/26/2020   Procedure: OPEN REDUCTION INTERNAL FIXATION (ORIF) ANKLE FRACTURE;  Surgeon: Nicholes Stairs, MD;  Location: [place];  Service: Orthopedics;  Laterality: Left;  90 mins   TONSILLECTOMY  1950   TOTAL KNEE ARTHROPLASTY Left 03/2005   TOTAL VAGINAL HYSTERECTOMY  1988   per pt still has ovaries    Family History  Problem Relation Age of Onset   Cirrhosis Mother    Antithrombin III deficiency Mother        multiple emboli   Ulcerative colitis Mother    Coronary artery disease Father    Heart attack Father    Hypertension Father    Protein S deficiency Sister        prior DVT   Protein S deficiency Daughter    Hypertension Sister    Hypertension Brother    Lung cancer Brother    Heart attack Brother    Heart attack Sister        age 53    Stroke Neg Hx    Colitis Neg Hx    Colon polyps Neg Hx    Esophageal cancer Neg Hx    Liver cancer Neg Hx    Pancreatic cancer Neg Hx    Rectal cancer Neg Hx    Stomach cancer Neg Hx    Social History   Socioeconomic History   Marital status: Married    Spouse name: [name]   Number of children: Not on file   Years of education: Not on  file   Highest education level: Not on file  Occupational History   Not on file  Tobacco Use   Smoking status: Never   Smokeless tobacco: Never  Vaping Use   Vaping Use: Never used  Substance and Sexual Activity   Alcohol use: No   Drug use: Never   Sexual activity: Not on file  Other Topics Concern   Not on file  Social History Narrative   Lives with spouse in [place].   2 grown daughters.   Retired Glass blower/designer     Social Determinants of Radio broadcast assistant Strain: Not on file  Food Insecurity: No Food Insecurity   Worried About Charity fundraiser in the Last Year: Never true   Arboriculturist in the Last Year: Never true  Transportation Needs: No Transportation Needs   Lack of Transportation (Medical): No   Lack of Transportation (Non-Medical): No  Physical Activity: Not on file  Stress: Not on file  Social Connections: Not on file    Review of Systems  Constitutional:  Positive for fatigue. Negative for appetite change and fever.  HENT:  Negative for congestion, ear pain, sinus pressure and sore throat.   Eyes:  Positive for visual disturbance. Negative for pain.  Respiratory:  Negative for cough, chest tightness, shortness of breath and wheezing.   Cardiovascular:  Negative for chest pain and palpitations.  Gastrointestinal:  Negative for abdominal pain, constipation, diarrhea, nausea and vomiting.  Endocrine: Positive for polydipsia, polyphagia and polyuria.  Genitourinary:  Negative for dysuria and hematuria.  Musculoskeletal:  Positive for arthralgias (rt shoulder hurting. Seen at the ED for October 3rd.), back pain (sees Dr. Nelva Bush (cspine and lspine are abnormal.) Takes hydrocodone at bedtime from him.) and myalgias. Negative for joint swelling.  Skin:  Negative for rash.  Neurological:  Positive for weakness. Negative for dizziness and headaches.  Psychiatric/Behavioral:  Negative for dysphoric mood. The patient is not nervous/anxious.     Objective:  BP 124/68 (BP Location: Right Arm, Patient Position: Sitting)   Pulse 68   Temp 97.8 F (36.6 C) (Temporal)   Ht 5\' 4"  (1.626 m)   Wt 186 lb (84.4 kg)   SpO2 98%   BMI 31.93 kg/m   BP/Weight 06/15/2021 10/09/5359 12/07/3152  Systolic BP 008 676 195  Diastolic BP 68 84 65  Wt. (Lbs) 186 184.6 181.7  BMI 31.93 31.69 30.71    Physical Exam Vitals reviewed.   Constitutional:      Appearance: Normal appearance. She is obese.  Neck:     Vascular: No carotid bruit.  Cardiovascular:     Rate and Rhythm: Normal rate and regular rhythm.     Pulses: Normal pulses.     Heart sounds: Normal heart sounds.  Pulmonary:     Effort: Pulmonary effort is normal. No respiratory distress.     Breath sounds: Normal breath sounds.  Abdominal:     General: Abdomen is flat. Bowel sounds are normal.     Palpations: Abdomen is soft.     Tenderness: There is no abdominal tenderness.  Neurological:     Mental Status: She is alert and oriented to person, place, and time.  Psychiatric:        Mood and Affect: Mood normal.        Behavior: Behavior normal.    Diabetic Foot Exam - Simple   Simple Foot Form  06/15/2021  2:30 PM  Visual Inspection No deformities, no ulcerations, no other  skin breakdown bilaterally: Yes Sensation Testing Intact to touch and monofilament testing bilaterally: Yes Pulse Check Posterior Tibialis and Dorsalis pulse intact bilaterally: Yes Comments      Lab Results  Component Value Date   WBC 6.4 06/15/2021   HGB 13.1 06/15/2021   HCT 38.6 06/15/2021   PLT 323 06/15/2021   GLUCOSE 164 (H) 06/15/2021   CHOL 148 06/15/2021   TRIG 65 06/15/2021   HDL 69 06/15/2021   LDLCALC 66 06/15/2021   ALT 27 06/15/2021   AST 23 06/15/2021   NA 135 06/15/2021   K 4.9 06/15/2021   CL 96 06/15/2021   CREATININE 1.41 (H) 06/15/2021   BUN 38 (H) 06/15/2021   CO2 23 06/15/2021   TSH 2.020 11/14/2019   INR 1.2 03/12/2021   HGBA1C 7.5 (H) 06/15/2021   MICROALBUR 10 06/15/2021      Assessment & Plan:   Problem List Items Addressed This Visit       Cardiovascular and Mediastinum   Atrial fibrillation [I48.91]    Rate controlled.  ON coumadin.        Endocrine   Type 2 diabetes mellitus with diabetic neuropathy, with long-term current use of insulin ([place]) - Primary (Chronic)     Still poorly controlled, but working on it.  Sugars still very labile. Management per specialist. Endocrinology.       Relevant Orders   CBC With Diff/Platelet (Completed)   Comprehensive metabolic panel (Completed)   Lipid panel (Completed)   Hemoglobin A1c (Completed)   POCT UA - Microalbumin (Completed)     Musculoskeletal and Integument   Osteoporosis, post-menopausal    DEXA scheduled by Dr. Doran Durand due to soft bones..        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 Continue Amlodipine 5mg  1 tablet once daily.         Hematopoietic and Hemostatic   Protein S deficiency ([place])    On coumadin. Mgmt by hematology.       Acquired thrombophilia ([place])    On coumadin for protein s deficiency. Mgmt by hematology.         Other   Mixed hyperlipidemia    The current medical regimen is effective;  continue present plan and medications. Continue Atorvastatin 40mg  1 tablet once daily. Recommend continue to work on eating healthy diet       Class 1 obesity due to excess calories with serious comorbidity and body mass index (BMI) of 31.0 to 31.9 in adult    Recommend continue to work on eating healthy diet.     .  No orders of the defined types were placed in this encounter.   Orders Placed This Encounter  Procedures   CBC With Diff/Platelet   Comprehensive metabolic panel   Lipid panel   Hemoglobin A1c   POCT UA - Microalbumin      Follow-up: Return in about 4 months (around 10/16/2021) for chronic fasting.  An After Visit Summary was printed and given to the patient.   I,Lauren M Auman,acting as a scribe for [name], MD.,have documented all relevant documentation on the behalf of [name], MD,as directed by  [name], MD while in the presence of [name], MD.    [name], MD [place] [phone]

## 2021-06-15 ENCOUNTER — Encounter: Payer: Self-pay | Admitting: Family Medicine

## 2021-06-15 ENCOUNTER — Other Ambulatory Visit: Payer: Self-pay

## 2021-06-15 ENCOUNTER — Ambulatory Visit (INDEPENDENT_AMBULATORY_CARE_PROVIDER_SITE_OTHER): Payer: Medicare PPO | Admitting: Family Medicine

## 2021-06-15 VITALS — BP 124/68 | HR 68 | Temp 97.8°F | Ht 64.0 in | Wt 186.0 lb

## 2021-06-15 DIAGNOSIS — Z794 Long term (current) use of insulin: Secondary | ICD-10-CM | POA: Diagnosis not present

## 2021-06-15 DIAGNOSIS — M810 Age-related osteoporosis without current pathological fracture: Secondary | ICD-10-CM

## 2021-06-15 DIAGNOSIS — D6859 Other primary thrombophilia: Secondary | ICD-10-CM | POA: Diagnosis not present

## 2021-06-15 DIAGNOSIS — D6869 Other thrombophilia: Secondary | ICD-10-CM

## 2021-06-15 DIAGNOSIS — E1140 Type 2 diabetes mellitus with diabetic neuropathy, unspecified: Secondary | ICD-10-CM

## 2021-06-15 DIAGNOSIS — I10 Essential (primary) hypertension: Secondary | ICD-10-CM

## 2021-06-15 DIAGNOSIS — Z6831 Body mass index (BMI) 31.0-31.9, adult: Secondary | ICD-10-CM

## 2021-06-15 DIAGNOSIS — I4811 Longstanding persistent atrial fibrillation: Secondary | ICD-10-CM | POA: Diagnosis not present

## 2021-06-15 DIAGNOSIS — E782 Mixed hyperlipidemia: Secondary | ICD-10-CM | POA: Diagnosis not present

## 2021-06-15 DIAGNOSIS — I129 Hypertensive chronic kidney disease with stage 1 through stage 4 chronic kidney disease, or unspecified chronic kidney disease: Secondary | ICD-10-CM

## 2021-06-15 DIAGNOSIS — N1831 Chronic kidney disease, stage 3a: Secondary | ICD-10-CM

## 2021-06-15 DIAGNOSIS — E6609 Other obesity due to excess calories: Secondary | ICD-10-CM

## 2021-06-15 HISTORY — DX: Type 2 diabetes mellitus with diabetic neuropathy, unspecified: E11.40

## 2021-06-15 LAB — POCT UA - MICROALBUMIN: Microalbumin Ur, POC: 10 mg/L

## 2021-06-16 LAB — COMPREHENSIVE METABOLIC PANEL
ALT: 27 IU/L (ref 0–32)
AST: 23 IU/L (ref 0–40)
Albumin/Globulin Ratio: 1.6 (ref 1.2–2.2)
Albumin: 4.4 g/dL (ref 3.7–4.7)
Alkaline Phosphatase: 73 IU/L (ref 44–121)
BUN/Creatinine Ratio: 27 (ref 12–28)
BUN: 38 mg/dL — ABNORMAL HIGH (ref 8–27)
Bilirubin Total: 0.3 mg/dL (ref 0.0–1.2)
CO2: 23 mmol/L (ref 20–29)
Calcium: 9.3 mg/dL (ref 8.7–10.3)
Chloride: 96 mmol/L (ref 96–106)
Creatinine, Ser: 1.41 mg/dL — ABNORMAL HIGH (ref 0.57–1.00)
Globulin, Total: 2.8 g/dL (ref 1.5–4.5)
Glucose: 164 mg/dL — ABNORMAL HIGH (ref 70–99)
Potassium: 4.9 mmol/L (ref 3.5–5.2)
Sodium: 135 mmol/L (ref 134–144)
Total Protein: 7.2 g/dL (ref 6.0–8.5)
eGFR: 39 mL/min/{1.73_m2} — ABNORMAL LOW (ref >59)

## 2021-06-16 LAB — CBC WITH DIFF/PLATELET
Basophils Absolute: 0.1 10*3/uL (ref 0.0–0.2)
Basos: 1 %
EOS (ABSOLUTE): 0.2 10*3/uL (ref 0.0–0.4)
Eos: 3 %
Hematocrit: 38.6 % (ref 34.0–46.6)
Hemoglobin: 13.1 g/dL (ref 11.1–15.9)
Immature Grans (Abs): 0 10*3/uL (ref 0.0–0.1)
Immature Granulocytes: 0 %
Lymphocytes Absolute: 1.3 10*3/uL (ref 0.7–3.1)
Lymphs: 20 %
MCH: 31 pg (ref 26.6–33.0)
MCHC: 33.9 g/dL (ref 31.5–35.7)
MCV: 91 fL (ref 79–97)
Monocytes Absolute: 0.5 10*3/uL (ref 0.1–0.9)
Monocytes: 8 %
Neutrophils Absolute: 4.4 10*3/uL (ref 1.4–7.0)
Neutrophils: 68 %
Platelets: 323 10*3/uL (ref 150–450)
RBC: 4.23 x10E6/uL (ref 3.77–5.28)
RDW: 12.5 % (ref 11.7–15.4)
WBC: 6.4 10*3/uL (ref 3.4–10.8)

## 2021-06-16 LAB — LIPID PANEL
Chol/HDL Ratio: 2.1 ratio (ref 0.0–4.4)
Cholesterol, Total: 148 mg/dL (ref 100–199)
HDL: 69 mg/dL (ref >39)
LDL Chol Calc (NIH): 66 mg/dL (ref 0–99)
Triglycerides: 65 mg/dL (ref 0–149)
VLDL Cholesterol Cal: 13 mg/dL (ref 5–40)

## 2021-06-16 LAB — HEMOGLOBIN A1C
Est. average glucose Bld gHb Est-mCnc: 169 mg/dL
Hgb A1c MFr Bld: 7.5 % — ABNORMAL HIGH (ref 4.8–5.6)

## 2021-06-17 NOTE — Progress Notes (Signed)
Blood count normal.  Liver function normal.  Kidney function abnormal. Little worse. Recommend push fluid.  Cholesterol: good HBA1C: 7.5. Little worse.

## 2021-06-19 ENCOUNTER — Encounter: Payer: Self-pay | Admitting: Family Medicine

## 2021-06-20 ENCOUNTER — Encounter: Payer: Self-pay | Admitting: Family Medicine

## 2021-06-27 DIAGNOSIS — D6869 Other thrombophilia: Secondary | ICD-10-CM | POA: Insufficient documentation

## 2021-06-27 DIAGNOSIS — I129 Hypertensive chronic kidney disease with stage 1 through stage 4 chronic kidney disease, or unspecified chronic kidney disease: Secondary | ICD-10-CM | POA: Insufficient documentation

## 2021-06-27 DIAGNOSIS — M810 Age-related osteoporosis without current pathological fracture: Secondary | ICD-10-CM | POA: Insufficient documentation

## 2021-06-27 DIAGNOSIS — E782 Mixed hyperlipidemia: Secondary | ICD-10-CM | POA: Insufficient documentation

## 2021-06-27 DIAGNOSIS — M8580 Other specified disorders of bone density and structure, unspecified site: Secondary | ICD-10-CM | POA: Insufficient documentation

## 2021-06-27 DIAGNOSIS — E6609 Other obesity due to excess calories: Secondary | ICD-10-CM | POA: Insufficient documentation

## 2021-06-27 DIAGNOSIS — N183 Chronic kidney disease, stage 3 unspecified: Secondary | ICD-10-CM | POA: Insufficient documentation

## 2021-06-27 HISTORY — DX: Other thrombophilia: D68.69

## 2021-06-27 NOTE — Assessment & Plan Note (Addendum)
DEXA scheduled by Dr. Doran Durand due to soft bones.Pamela [name]

## 2021-06-27 NOTE — Assessment & Plan Note (Signed)
The current medical regimen is effective;  continue present plan and medications. Continue Atorvastatin 40mg  1 tablet once daily. Recommend continue to work on eating healthy diet

## 2021-06-27 NOTE — Assessment & Plan Note (Signed)
Rate controlled.  ON coumadin.

## 2021-06-27 NOTE — Assessment & Plan Note (Signed)
On coumadin for protein s deficiency. Mgmt by hematology.

## 2021-06-27 NOTE — Assessment & Plan Note (Signed)
On coumadin. Mgmt by hematology.

## 2021-06-27 NOTE — Assessment & Plan Note (Signed)
Still poorly controlled, but working on it. Sugars still very labile. Management per specialist. Endocrinology.

## 2021-06-27 NOTE — Assessment & Plan Note (Signed)
The current medical regimen is effective;  continue present plan and medications. Continue Amlodipine 5mg  1 tablet once daily.

## 2021-06-27 NOTE — Assessment & Plan Note (Signed)
Recommend continue to work on eating healthy diet.  

## 2021-06-28 ENCOUNTER — Encounter: Payer: Self-pay | Admitting: Hematology and Oncology

## 2021-06-29 ENCOUNTER — Encounter: Payer: Self-pay | Admitting: Nurse Practitioner

## 2021-06-29 ENCOUNTER — Ambulatory Visit (INDEPENDENT_AMBULATORY_CARE_PROVIDER_SITE_OTHER): Payer: Medicare PPO | Admitting: Nurse Practitioner

## 2021-06-29 ENCOUNTER — Ambulatory Visit: Payer: TRICARE For Life (TFL)

## 2021-06-29 ENCOUNTER — Other Ambulatory Visit: Payer: Self-pay | Admitting: Nurse Practitioner

## 2021-06-29 VITALS — BP 122/60 | HR 75 | Temp 97.2°F | Ht 64.0 in | Wt 189.0 lb

## 2021-06-29 DIAGNOSIS — J45909 Unspecified asthma, uncomplicated: Secondary | ICD-10-CM

## 2021-06-29 DIAGNOSIS — J018 Other acute sinusitis: Secondary | ICD-10-CM

## 2021-06-29 DIAGNOSIS — R0981 Nasal congestion: Secondary | ICD-10-CM | POA: Diagnosis not present

## 2021-06-29 DIAGNOSIS — R0609 Other forms of dyspnea: Secondary | ICD-10-CM

## 2021-06-29 DIAGNOSIS — R051 Acute cough: Secondary | ICD-10-CM | POA: Diagnosis not present

## 2021-06-29 LAB — POCT RAPID STREP A (OFFICE): Rapid Strep A Screen: NEGATIVE

## 2021-06-29 LAB — POCT INFLUENZA A/B
Influenza A, POC: NEGATIVE
Influenza B, POC: NEGATIVE

## 2021-06-29 LAB — POC COVID19 BINAXNOW: SARS Coronavirus 2 Ag: NEGATIVE

## 2021-06-29 MED ORDER — BUDESONIDE-FORMOTEROL FUMARATE 80-4.5 MCG/ACT IN AERO: 2.0000 | INHALATION_SPRAY | Freq: Two times a day (BID) | RESPIRATORY_TRACT | 3 refills | Status: DC

## 2021-06-29 MED ORDER — CEFUROXIME AXETIL 250 MG PO TABS: 250.0000 mg | ORAL_TABLET | Freq: Two times a day (BID) | ORAL | 0 refills | Status: AC

## 2021-06-29 MED ORDER — PROMETHAZINE-DM 6.25-15 MG/5ML PO SYRP: 5.0000 mL | ORAL_SOLUTION | Freq: Four times a day (QID) | ORAL | 0 refills | Status: DC | PRN

## 2021-06-29 MED ORDER — AZELASTINE HCL 0.1 % NA SOLN: 2.0000 | Freq: Every day | NASAL | 6 refills | Status: DC | PRN

## 2021-06-29 NOTE — Progress Notes (Signed)
Acute Office Visit  Subjective:    Patient ID: Pamela [name], female    DOB: March 26, 1946, 75 y.o.   MRN: [id number]  Chief Complaint  Patient presents with   URI     HPI Patient is in today for hoarseness, sinus congestion, cough, post-nasal-drip, and dyspnea. She has swelling in bilateral feet and ankles, has orthopnea. Denies seasonal allergies, chest pain. Onset was one week ago. Treatment has included Benadryl Cold and Sinus and Alka-Seltzer cold medication; inhalers, and pushing fluids. States she is coughing up thick green mucus intermittently. Pt has passed history oaf asthma, chronic bronchitis, and pneumonia.       Past Medical History:  Diagnosis Date   Anticoagulated on Coumadin    chronic--- managed by hematology/ oncology,   Asthma, mild intermittent    followed by pcp--- per pt last exacerbation winter 2021 w/ acute bronchitis   Bilateral leg cramps    Chronic constipation    CKD (chronic kidney disease), stage III ([place])    Closed bimalleolar fracture of left ankle 02/26/2020   DDD (degenerative disc disease), cervical    w/ spondylosis,  per pt last steroid injection 06/ 2022   DDD (degenerative disc disease), lumbosacral    DVT (deep venous thrombosis) ([place]) 07/30/2013   Dyspnea    occasionally   GERD (gastroesophageal reflux disease)    History of cardiac murmur as a child    History of DVT of lower extremity    left lower extremity in 1980s, fell when bowling   History of pulmonary embolus (PE) 1993   per pt left lung post op 2 wks cholecystectomy   History of rheumatic fever as a child    per last echo 12-31-2019 no valvular issues   History of TIA (transient ischemic attack)    2014 and 2018 or 2019,  per pt no residual   HTN (hypertension)    followed by pcp   Hyperlipidemia    IDA (iron deficiency anemia)    Macular degeneration of both eyes    Mild obstructive sleep apnea    per pt dx 2017 tried to uses cpap but intolerant    Mixed incontinence urge and stress    urologist--- dr Nila Nephew   OA (osteoarthritis) 07/06/2018   Osteoporosis    PAF (paroxysmal atrial fibrillation) ([place]) 10/01/2014   cardiologist--- dr Godfrey Pick tobb;   cardiac cath 02-18-2013 normal coronaries arteries, ef 50%, cath done since echo showed ef 30-35%; nucleat stress study 04/ 2020 normal , normal echo 04/ 2021,  event monitor 09-14-2020 rare ST/AT variable block   PONV (postoperative nausea and vomiting)    Protein S deficiency ([place])    followed by hemotology/ oncology-- dr d. Bobby Rumpf ([facility]) dx 1980s;  prior DVT left lower leg 1980s and left lung PE 1993; chronic  coumadin since 1980s   PVC's (premature ventricular contractions)    followed by cardiology   S/P cardiac catheterization 02/2013   Normal coronaries; low normal EF at 50%   Solitary pulmonary nodule on lung CT 02/06/2019   First noted 01/13/2014 > no change as of 12/21/2018   Spondylolisthesis, lumbar region 08/09/2018   Stroke ([place])    TIA in 2018 or 2019   Transient ischemic attack 07/30/2013   Type 2 diabetes mellitus treated with insulin ([place])    endocrilogist--- whitney reardon NP     (03-10-2021  pt continuously checks blood sugar throughout the day w/ Libre, fasting sugar --- 69--200)   Wears  glasses    Wears hearing aid in both ears     Past Surgical History:  Procedure Laterality Date   CARDIAC CATHETERIZATION  02/18/2013   @ [place]  by Dr Martinique;  normal coronaries w/ preserved LVF, ef 50%;   previous cath 2001 normal ef 65%   CARPAL TUNNEL RELEASE Bilateral 1994   CATARACT EXTRACTION W/ INTRAOCULAR LENS IMPLANT Bilateral 2017   CHOLECYSTECTOMY, LAPAROSCOPIC  1993   COLONOSCOPY     FINGER SURGERY Left 2018   thumb   FOOT TENDON SURGERY Right    early 2000s   HARDWARE REMOVAL Left 03/12/2021   Procedure: HARDWARE REMOVAL;  Surgeon: Nicholes Stairs, MD;  Location: [facility];  Service: Orthopedics;  Laterality: Left;  60 MINS    ORIF ANKLE FRACTURE Left 02/26/2020   Procedure: OPEN REDUCTION INTERNAL FIXATION (ORIF) ANKLE FRACTURE;  Surgeon: Nicholes Stairs, MD;  Location: [facility];  Service: Orthopedics;  Laterality: Left;  90 mins   TONSILLECTOMY  1950   TOTAL KNEE ARTHROPLASTY Left 03/2005   TOTAL VAGINAL HYSTERECTOMY  1988   per pt still has ovaries    Family History  Problem Relation Age of Onset   Cirrhosis Mother    Antithrombin III deficiency Mother        multiple emboli   Ulcerative colitis Mother    Coronary artery disease Father    Heart attack Father    Hypertension Father    Protein S deficiency Sister        prior DVT   Protein S deficiency Daughter    Hypertension Sister    Hypertension Brother    Lung cancer Brother    Heart attack Brother    Heart attack Sister        age 86    Stroke Neg Hx    Colitis Neg Hx    Colon polyps Neg Hx    Esophageal cancer Neg Hx    Liver cancer Neg Hx    Pancreatic cancer Neg Hx    Rectal cancer Neg Hx    Stomach cancer Neg Hx     Social History   Socioeconomic History   Marital status: Married    Spouse name: [name]   Number of children: Not on file   Years of education: Not on file   Highest education level: Not on file  Occupational History   Not on file  Tobacco Use   Smoking status: Never   Smokeless tobacco: Never  Vaping Use   Vaping Use: Never used  Substance and Sexual Activity   Alcohol use: No   Drug use: Never   Sexual activity: Not on file  Other Topics Concern   Not on file  Social History Narrative   Lives with spouse in [place].  2 grown daughters.   Retired Glass blower/designer     Social Determinants of Radio broadcast assistant Strain: Not on file  Food Insecurity: No Food Insecurity   Worried About Charity fundraiser in the Last Year: Never true   Arboriculturist in the Last Year: Never true  Transportation Needs: No Transportation Needs   Lack of Transportation (Medical): No   Lack of Transportation  (Non-Medical): No  Physical Activity: Not on file  Stress: Not on file  Social Connections: Not on file  Intimate Partner Violence: Not on file    Outpatient Medications Prior to Visit  Medication Sig Dispense Refill   albuterol (VENTOLIN HFA) 108 (90  Base) MCG/ACT inhaler Inhale 1-2 puffs into the lungs every 6 (six) hours as needed for wheezing or shortness of breath.     amLODipine (NORVASC) 5 MG tablet Take 1 tablet (5 mg total) by mouth daily. 90 tablet 0   ASPIRIN LOW DOSE 81 MG EC tablet TAKE 1 TABLET(81 MG) BY MOUTH DAILY 90 tablet 3   atorvastatin (LIPITOR) 40 MG tablet Take 1 tablet (40 mg total) by mouth daily at 12 noon. (Patient taking differently: Take 40 mg by mouth daily with lunch.) 90 tablet 1   azelastine (ASTELIN) 137 MCG/SPRAY nasal spray Place 2 sprays into the nose daily as needed for rhinitis or allergies.     B Complex-C (B-COMPLEX WITH VITAMIN C) tablet Take 1 tablet by mouth daily with lunch.     Cholecalciferol (VITAMIN D3) 50 MCG (2000 UT) TABS Take 2,000 Units by mouth daily with lunch.      Continuous Blood Gluc Receiver (FREESTYLE LIBRE 2 READER) DEVI 1 each by Does not apply route 4 (four) times daily - after meals and at bedtime. E11.40 1 each 0   Continuous Blood Gluc Sensor (FREESTYLE LIBRE 2 SENSOR) MISC 2 each by Does not apply route every 14 (fourteen) days. E11.40 6 each 3   estradiol (ESTRACE) 0.1 MG/GM vaginal cream Place 1 Applicatorful vaginally 2 (two) times a week. Uses twice weekly     ferrous sulfate 325 (65 FE) MG tablet Take 325 mg by mouth daily with lunch.     furosemide (LASIX) 20 MG tablet Take 1 tablet (20 mg total) by mouth daily. 90 tablet 1   HYDROcodone-acetaminophen (NORCO/VICODIN) 5-325 MG tablet Take 1 tablet by mouth every 4 (four) hours as needed for moderate pain. 20 tablet 0   Insulin Aspart FlexPen (NOVOLOG) 100 UNIT/ML Inject 4-10 Units into the skin 3 (three) times daily with meals. 15 mL 3   insulin glargine, 1 Unit Dial,  (TOUJEO) 300 UNIT/ML Solostar Pen Inject 15 Units into the skin at bedtime. 135 mL 1   Insulin Pen Needle (BD PEN NEEDLE NANO 2ND GEN) 32G X 4 MM MISC USE DAILY AS DIRECTED 200 each 3   ipratropium-albuterol (DUONEB) 0.5-2.5 (3) MG/3ML SOLN Take 3 mLs by nebulization 4 (four) times daily as needed (wheezing/shortness of breath).  0   lisinopril (ZESTRIL) 20 MG tablet TAKE 2 TABLETS(40 MG) BY MOUTH TWICE DAILY 180 tablet 1   magnesium oxide (MAG-OX) 400 (240 Mg) MG tablet TAKE 2 TABLETS(800 MG) BY MOUTH TWICE DAILY (Patient taking differently: Take 800 mg by mouth 2 (two) times daily.) 120 tablet 2   Menthol-Methyl Salicylate (SALONPAS PAIN RELIEF PATCH) PTCH Apply 1 patch topically daily as needed (pain.).     methocarbamol (ROBAXIN) 500 MG tablet Take 500 mg by mouth every 8 (eight) hours as needed for muscle spasms.     methocarbamol (ROBAXIN) 750 MG tablet Take 750 mg by mouth 3 (three) times daily.     ondansetron (ZOFRAN) 4 MG tablet Take 1 tablet (4 mg total) by mouth daily as needed for nausea or vomiting. 30 tablet 1   ondansetron (ZOFRAN-ODT) 8 MG disintegrating tablet Take 8 mg by mouth every 8 (eight) hours as needed for nausea or vomiting.      pantoprazole (PROTONIX) 40 MG tablet Take 1 tablet (40 mg total) by mouth every morning. 90 tablet 3   polyethylene glycol (MIRALAX / GLYCOLAX) 17 g packet Take 17 g by mouth as needed. Uses 1-2 times weekly  pregabalin (LYRICA) 75 MG capsule pregabalin 75 mg capsule  TAKE 1 CAPSULE BY MOUTH TWICE DAILY     senna-docusate (SENOKOT-S) 8.6-50 MG tablet Take 1 tablet by mouth at bedtime.     sodium chloride (OCEAN) 0.65 % SOLN nasal spray Place 1-2 sprays into both nostrils 4 (four) times daily as needed for congestion.     trimethoprim (TRIMPEX) 100 MG tablet Take 100 mg by mouth at bedtime.     Vibegron (GEMTESA PO) Take by mouth at bedtime.     warfarin (COUMADIN) 1 MG tablet TAKE AS DIRECTED. SEE ADMIN INSTRUCTIONS. 120 tablet 2   warfarin  (COUMADIN) 5 MG tablet TAKE 1 TABLET BY MOUTH EVERY DAY (Patient taking differently: Take by mouth as directed.) 90 tablet 1   No facility-administered medications prior to visit.    Allergies  Allergen Reactions   Ketek [Telithromycin] Nausea And Vomiting and Rash   Loxapine Succinate Hives   Naproxen Sodium Anaphylaxis and Hives   Amoxapine And Related Hives   Darvon Nausea And Vomiting   Belsomra [Suvorexant] Other (See Comments)    "Nightmares"   Cymbalta [Duloxetine Hcl] Other (See Comments)    "Blackouts"   Gabapentin Other (See Comments)    Blurry vision   Lyrica [Pregabalin]     Makes her sedated   Amoxicillin Rash   Propoxyphene Nausea And Vomiting    Review of Systems  Constitutional:  Positive for appetite change (decreased) and fatigue. Negative for chills and fever.  HENT:  Positive for congestion, ear pain (BL), postnasal drip, rhinorrhea, sinus pressure, sinus pain and sore throat.   Eyes:  Positive for discharge.  Respiratory:  Positive for cough and shortness of breath.   Cardiovascular:  Positive for leg swelling. Negative for chest pain.  Gastrointestinal:  Negative for diarrhea and nausea.  Endocrine: Negative.   Genitourinary: Negative.   Musculoskeletal: Negative.   Skin: Negative.   Allergic/Immunologic: Negative for environmental allergies.  Neurological:  Positive for headaches. Negative for dizziness.  Hematological: Negative.   Psychiatric/Behavioral: Negative.        Objective:    Physical Exam Vitals reviewed.  HENT:     Right Ear: Tympanic membrane normal.     Left Ear: Tympanic membrane normal.     Nose: Congestion and rhinorrhea present.     Mouth/Throat:     Mouth: Mucous membranes are dry.     Pharynx: Posterior oropharyngeal erythema present.  Cardiovascular:     Rate and Rhythm: Normal rate. Rhythm irregular.  Pulmonary:     Effort: Pulmonary effort is normal.     Comments: Diminished lung sounds in all lobes Abdominal:      General: Bowel sounds are normal.     Palpations: Abdomen is soft.  Skin:    General: Skin is warm and dry.     Capillary Refill: Capillary refill takes less than 2 seconds.  Neurological:     General: No focal deficit present.     Mental Status: She is alert and oriented to person, place, and time.    Temp (!) 97.2 F (36.2 C)   Ht _0  (1.626 m)   Wt 189 lb (85.7 kg)   BMI 32.44 kg/m  BP 122/60   Pulse 75   Temp (!) 97.2 F (36.2 C)   Ht _1  (1.626 m)   Wt 189 lb (85.7 kg)   SpO2 95%   BMI 32.44 kg/m   Wt Readings from Last 3 Encounters:  06/29/21 189 lb (85.7  kg)  06/15/21 186 lb (84.4 kg)  05/11/21 184 lb 9.6 oz (83.7 kg)    Health Maintenance Due  Topic Date Due   OPHTHALMOLOGY EXAM  Never done   Hepatitis C Screening  Never done   Zoster Vaccines- Shingrix (1 of 2) Never done   Pneumonia Vaccine 45+ Years old (3 - PPSV23 if available, else PCV20) 07/31/2015   COVID-19 Vaccine (5 - Booster for Pfizer series) 04/01/2021    Lab Results  Component Value Date   TSH 2.020 11/14/2019   Lab Results  Component Value Date   WBC 6.4 06/15/2021   HGB 13.1 06/15/2021   HCT 38.6 06/15/2021   MCV 91 06/15/2021   PLT 323 06/15/2021   Lab Results  Component Value Date   NA 135 06/15/2021   K 4.9 06/15/2021   CO2 23 06/15/2021   GLUCOSE 164 (H) 06/15/2021   BUN 38 (H) 06/15/2021   CREATININE 1.41 (H) 06/15/2021   BILITOT 0.3 06/15/2021   ALKPHOS 73 06/15/2021   AST 23 06/15/2021   ALT 27 06/15/2021   PROT 7.2 06/15/2021   ALBUMIN 4.4 06/15/2021   CALCIUM 9.3 06/15/2021   ANIONGAP 12 02/26/2020   EGFR 39 (L) 06/15/2021   GFR 57.98 (L) 03/10/2020   Lab Results  Component Value Date   CHOL 148 06/15/2021   Lab Results  Component Value Date   HDL 69 06/15/2021   Lab Results  Component Value Date   LDLCALC 66 06/15/2021   Lab Results  Component Value Date   TRIG 65 06/15/2021   Lab Results  Component Value Date   CHOLHDL 2.1 06/15/2021    Lab Results  Component Value Date   HGBA1C 7.5 (H) 06/15/2021       Assessment & Plan:    1. Acute non-recurrent sinusitis of other sinus - cefUROXime (CEFTIN) 250 MG tablet; Take 1 tablet (250 mg total) by mouth 2 (two) times daily with a meal for 10 days.  Dispense: 20 tablet; Refill: 0  2. Moderate asthma without complication, unspecified whether persistent - budesonide-formoterol (SYMBICORT) 80-4.5 MCG/ACT inhaler; Inhale 2 puffs into the lungs 2 (two) times daily.  Dispense: 1 each; Refill: 3  3. Sinus congestion - POCT rapid strep A - POCT Influenza A/B - POC COVID-19 BinaxNow - budesonide-formoterol (SYMBICORT) 80-4.5 MCG/ACT inhaler; Inhale 2 puffs into the lungs 2 (two) times daily.  Dispense: 1 each; Refill: 3 - promethazine-dextromethorphan (PROMETHAZINE-DM) 6.25-15 MG/5ML syrup; Take 5 mLs by mouth 4 (four) times daily as needed for cough.  Dispense: 118 mL; Refill: 0  4. Acute cough - promethazine-dextromethorphan (PROMETHAZINE-DM) 6.25-15 MG/5ML syrup; Take 5 mLs by mouth 4 (four) times daily as needed for cough.  Dispense: 118 mL; Refill: 0  5. Dyspnea on exertion - budesonide-formoterol (SYMBICORT) 80-4.5 MCG/ACT inhaler; Inhale 2 puffs into the lungs 2 (two) times daily.  Dispense: 1 each; Refill: 3    Take ceftin 250 mg twice daily for 10 days Use cough syrup up to four times daily as needed Use Symbicort inhaler daily  Use Albuterol inhaler as needed Take Mucinex twice daily OTC Seek emergency medical care for severe worsening or concerning symptoms    Follow-up: PRN  I, Rip Harbour, NP, have reviewed all documentation for this visit. The documentation on 06/29/21 for the exam, diagnosis, procedures, and orders are all accurate and complete.   SignedJerrell Belfast, DNP 06/29/21 at 9:07 PM

## 2021-06-29 NOTE — Patient Instructions (Addendum)
Take ceftin 250 mg twice daily for 10 days Use cough syrup up to four times daily as needed Use Symbicort inhaler daily  Use Albuterol inhaler as needed Take Mucinex twice daily OTC Seek emergency medical care for severe worsening or concerning symptoms Sinusitis, Adult Sinusitis is soreness and swelling (inflammation) of your sinuses. Sinuses are hollow spaces in the bones around your face. They are located: Around your eyes. In the middle of your forehead. Behind your nose. In your cheekbones. Your sinuses and nasal passages are lined with a fluid called mucus. Mucus drains out of your sinuses. Swelling can trap mucus in your sinuses. This lets germs (bacteria, virus, or fungus) grow, which leads to infection. Most of the time, this condition is caused by a virus. What are the causes? This condition is caused by: Allergies. Asthma. Germs. Things that block your nose or sinuses. Growths in the nose (nasal polyps). Chemicals or irritants in the air. Fungus (rare). What increases the risk? You are more likely to develop this condition if: You have a weak body defense system (immune system). You do a lot of swimming or diving. You use nasal sprays too much. You smoke. What are the signs or symptoms? The main symptoms of this condition are pain and a feeling of pressure around the sinuses. Other symptoms include: Stuffy nose (congestion). Runny nose (drainage). Swelling and warmth in the sinuses. Headache. Toothache. A cough that may get worse at night. Mucus that collects in the throat or the back of the nose (postnasal drip). Being unable to smell and taste. Being very tired (fatigue). A fever. Sore throat. Bad breath. How is this diagnosed? This condition is diagnosed based on: Your symptoms. Your medical history. A physical exam. Tests to find out if your condition is short-term (acute) or long-term (chronic). Your doctor may: Check your nose for growths  (polyps). Check your sinuses using a tool that has a light (endoscope). Check for allergies or germs. Do imaging tests, such as an MRI or CT scan. How is this treated? Treatment for this condition depends on the cause and whether it is short-term or long-term. If caused by a virus, your symptoms should go away on their own within 10 days. You may be given medicines to relieve symptoms. They include: Medicines that shrink swollen tissue in the nose. Medicines that treat allergies (antihistamines). A spray that treats swelling of the nostrils.  Rinses that help get rid of thick mucus in your nose (nasal saline washes). If caused by bacteria, your doctor may wait to see if you will get better without treatment. You may be given antibiotic medicine if you have: A very bad infection. A weak body defense system. If caused by growths in the nose, you may need to have surgery. Follow these instructions at home: Medicines Take, use, or apply over-the-counter and prescription medicines only as told by your doctor. These may include nasal sprays. If you were prescribed an antibiotic medicine, take it as told by your doctor. Do not stop taking the antibiotic even if you start to feel better. Hydrate and humidify  Drink enough water to keep your pee (urine) pale yellow. Use a cool mist humidifier to keep the humidity level in your home above 50%. Breathe in steam for 10-15 minutes, 3-4 times a day, or as told by your doctor. You can do this in the bathroom while a hot shower is running. Try not to spend time in cool or dry air. Rest Rest as much as  you can. Sleep with your head raised (elevated). Make sure you get enough sleep each night. General instructions  Put a warm, moist washcloth on your face 3-4 times a day, or as often as told by your doctor. This will help with discomfort. Wash your hands often with soap and water. If there is no soap and water, use hand sanitizer. Do not smoke. Avoid  being around people who are smoking (secondhand smoke). Keep all follow-up visits as told by your doctor. This is important. Contact a doctor if: You have a fever. Your symptoms get worse. Your symptoms do not get better within 10 days. Get help right away if: You have a very bad headache. You cannot stop throwing up (vomiting). You have very bad pain or swelling around your face or eyes. You have trouble seeing. You feel confused. Your neck is stiff. You have trouble breathing. Summary Sinusitis is swelling of your sinuses. Sinuses are hollow spaces in the bones around your face. This condition is caused by tissues in your nose that become inflamed or swollen. This traps germs. These can lead to infection. If you were prescribed an antibiotic medicine, take it as told by your doctor. Do not stop taking it even if you start to feel better. Keep all follow-up visits as told by your doctor. This is important. This information is not intended to replace advice given to you by your health care provider. Make sure you discuss any questions you have with your health care provider. Document Revised: 01/22/2018 Document Reviewed: 01/22/2018 Elsevier Patient Education  2022 [employer].

## 2021-06-30 ENCOUNTER — Telehealth: Payer: Self-pay

## 2021-06-30 ENCOUNTER — Telehealth: Payer: Self-pay | Admitting: Hematology and Oncology

## 2021-06-30 NOTE — Telephone Encounter (Signed)
Spoke with patient regarding home INR of 3.8 from 10/25. She is on Ceftin for bronchitis. She took her Coumadin yesterday. I advised her to hold Coumadin today, then decrease to 5mg  daily and repeat INR on 11/1. She expressed understanding

## 2021-06-30 NOTE — Chronic Care Management (AMB) (Signed)
Chronic Care Management Pharmacy Assistant   Name: Pamela [name]  MRN: [id number] DOB: December 29, 1945  Reason for Encounter: Disease State call for DM   Recent office visits:  06/29/21 Pamela Belfast NP. Seen for Sinusitis. Stated on Symbicort 80-4.26mcg 2 puffs 2 times daily. Started on Ceftin 250 mg 2 times daily. Started on Promethazine DM 6.25-15 mg/37ml 4 times daily prn.   06/15/21 Pamela Brome MD. Seen for Diabetes. Completed course of Tylenol [phone] mg and Tramadol-Acetaminophen 37.5-325 mg.  Recent consult visits:  None   Hospital visits:  None   Medications: Outpatient Encounter Medications as of 06/30/2021  Medication Sig   albuterol (VENTOLIN HFA) 108 (90 Base) MCG/ACT inhaler Inhale 1-2 puffs into the lungs every 6 (six) hours as needed for wheezing or shortness of breath.   amLODipine (NORVASC) 5 MG tablet Take 1 tablet (5 mg total) by mouth daily.   ASPIRIN LOW DOSE 81 MG EC tablet TAKE 1 TABLET(81 MG) BY MOUTH DAILY   atorvastatin (LIPITOR) 40 MG tablet Take 1 tablet (40 mg total) by mouth daily at 12 noon. (Patient taking differently: Take 40 mg by mouth daily with lunch.)   azelastine (ASTELIN) 0.1 % nasal spray Place 2 sprays into both nostrils daily as needed for rhinitis or allergies.   B Complex-C (B-COMPLEX WITH VITAMIN C) tablet Take 1 tablet by mouth daily with lunch.   budesonide-formoterol (SYMBICORT) 80-4.5 MCG/ACT inhaler Inhale 2 puffs into the lungs 2 (two) times daily.   cefUROXime (CEFTIN) 250 MG tablet Take 1 tablet (250 mg total) by mouth 2 (two) times daily with a meal for 10 days.   Cholecalciferol (VITAMIN D3) 50 MCG (2000 UT) TABS Take 2,000 Units by mouth daily with lunch.    Continuous Blood Gluc Receiver (FREESTYLE LIBRE 2 READER) DEVI 1 each by Does not apply route 4 (four) times daily - after meals and at bedtime. E11.40   Continuous Blood Gluc Sensor (FREESTYLE LIBRE 2 SENSOR) MISC 2 each by Does not apply route every 14  (fourteen) days. 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 Uses twice weekly   ferrous sulfate 325 (65 FE) MG tablet Take 325 mg by mouth daily with lunch.   furosemide (LASIX) 20 MG tablet Take 1 tablet (20 mg total) by mouth daily. (Patient taking differently: Take 40 mg by mouth 2 (two) times daily.)   HYDROcodone-acetaminophen (NORCO/VICODIN) 5-325 MG tablet Take 1 tablet by mouth every 4 (four) hours as needed for moderate pain.   Insulin Aspart FlexPen (NOVOLOG) 100 UNIT/ML Inject 4-10 Units into the skin 3 (three) times daily with meals.   insulin glargine, 1 Unit Dial, (TOUJEO) 300 UNIT/ML Solostar Pen Inject 15 Units into the skin at bedtime.   Insulin Pen Needle (BD PEN NEEDLE NANO 2ND GEN) 32G X 4 MM MISC USE DAILY AS DIRECTED   ipratropium-albuterol (DUONEB) 0.5-2.5 (3) MG/3ML SOLN Take 3 mLs by nebulization 4 (four) times daily as needed (wheezing/shortness of breath).   lisinopril (ZESTRIL) 20 MG tablet TAKE 2 TABLETS(40 MG) BY MOUTH TWICE DAILY   magnesium oxide (MAG-OX) 400 (240 Mg) MG tablet TAKE 2 TABLETS(800 MG) BY MOUTH TWICE DAILY (Patient taking differently: Take 800 mg by mouth 2 (two) times daily.)   Menthol-Methyl Salicylate (SALONPAS PAIN RELIEF PATCH) PTCH Apply 1 patch topically daily as needed (pain.).   methocarbamol (ROBAXIN) 500 MG tablet Take 500 mg by mouth every 8 (eight) hours as  needed for muscle spasms.   methocarbamol (ROBAXIN) 750 MG tablet Take 750 mg by mouth 3 (three) times daily.   ondansetron (ZOFRAN) 4 MG tablet Take 1 tablet (4 mg total) by mouth daily as needed for nausea or vomiting.   ondansetron (ZOFRAN-ODT) 8 MG disintegrating tablet Take 8 mg by mouth every 8 (eight) hours as needed for nausea or vomiting.    pantoprazole (PROTONIX) 40 MG tablet Take 1 tablet (40 mg total) by mouth every morning.   polyethylene glycol (MIRALAX / GLYCOLAX) 17 g packet Take 17 g by mouth as needed.  Uses 1-2 times weekly   pregabalin (LYRICA) 75 MG capsule pregabalin 75 mg capsule  TAKE 1 CAPSULE BY MOUTH TWICE DAILY   promethazine-dextromethorphan (PROMETHAZINE-DM) 6.25-15 MG/5ML syrup Take 5 mLs by mouth 4 (four) times daily as needed for cough.   senna-docusate (SENOKOT-S) 8.6-50 MG tablet Take 1 tablet by mouth at bedtime.   sodium chloride (OCEAN) 0.65 % SOLN nasal spray Place 1-2 sprays into both nostrils 4 (four) times daily as needed for congestion.   trimethoprim (TRIMPEX) 100 MG tablet Take 100 mg by mouth at bedtime.   Vibegron (GEMTESA PO) Take by mouth at bedtime.   warfarin (COUMADIN) 1 MG tablet TAKE AS DIRECTED. SEE ADMIN INSTRUCTIONS.   warfarin (COUMADIN) 5 MG tablet TAKE 1 TABLET BY MOUTH EVERY DAY (Patient taking differently: Take by mouth as directed.)   No facility-administered encounter medications on file as of 06/30/2021.   Recent Relevant Labs: Lab Results  Component Value Date/Time   HGBA1C 7.5 (H) 06/15/2021 09:33 AM   HGBA1C 7.2 (H) 03/10/2021 11:11 AM   MICROALBUR 10 06/15/2021 04:46 PM   MICROALBUR 10 07/29/2020 12:01 PM    Kidney Function Lab Results  Component Value Date/Time   CREATININE 1.41 (H) 06/15/2021 09:33 AM   CREATININE 1.24 (H) 03/10/2021 11:11 AM   GFR 57.98 (L) 03/10/2020 04:46 PM   GFRNONAA 61 09/11/2020 11:23 AM   GFRAA 70 09/11/2020 11:23 AM     Current antihyperglycemic regimen:  Toujeo 300 units. Inject 15 units into skin at bedtime Novolog 100 units. Inject 4-10 units 3 times daily with meals  Patient verbally confirms she is taking the above medications as directed. Yes  What recent interventions/DTPs have been made to improve glycemic control:  Pt stated no changes   Have there been any recent hospitalizations or ED visits since last visit with CPP? Yes, pt was seen at [facility] for shoulder pain. Pt stated no med changes.  Patient reports hypoglycemic symptoms, including Shaky and Nervous/irritable  Patient  reports hyperglycemic symptoms, including excessive thirst. Pt stated she stays thirsty and is always drinking water which does not help   How often are you checking your blood sugar?  3-4 times daily  What are your blood sugars ranging?  06/29/21 [phone] 06/28/21 [phone]  On insulin? Yes How many units: 4-10 units   During the week, how often does your blood glucose drop below 70? 1-2 a day   Are you checking your feet daily/regularly? Yes  Adherence Review: Is the patient currently on a STATIN medication? Yes Is the patient currently on ACE/ARB medication? Yes Does the patient have >5 day gap between last estimated fill dates? No  Care Gaps: Last eye exam / Retinopathy Screening? Never done  Last Annual Wellness Visit? 05/04/20 Last Diabetic Foot Exam? 11/12/20   Star Rating Drugs:  Medication:  Last Fill: Day Supply None     Elray Mcgregor, CMA Clinical  Pharmacist Assistant  [phone]

## 2021-07-05 ENCOUNTER — Telehealth: Payer: Self-pay

## 2021-07-05 NOTE — Telephone Encounter (Signed)
I notified Dr Bobby Rumpf of pt's INR today, which was 1.8. He recommends she return to her usual Coumadin dosing, which is Coumadin 6mg  on Tue, Thur and Sat; Coumadin 5mg  other days.  I called pt and notified her of above. She verbalized understanding.

## 2021-07-08 ENCOUNTER — Telehealth: Payer: Self-pay | Admitting: Cardiology

## 2021-07-08 DIAGNOSIS — H348322 Tributary (branch) retinal vein occlusion, left eye, stable: Secondary | ICD-10-CM | POA: Diagnosis not present

## 2021-07-08 DIAGNOSIS — H35372 Puckering of macula, left eye: Secondary | ICD-10-CM | POA: Diagnosis not present

## 2021-07-08 DIAGNOSIS — H353132 Nonexudative age-related macular degeneration, bilateral, intermediate dry stage: Secondary | ICD-10-CM | POA: Diagnosis not present

## 2021-07-08 MED ORDER — FUROSEMIDE 20 MG PO TABS: 20.0000 mg | ORAL_TABLET | Freq: Every day | ORAL | 1 refills | Status: DC

## 2021-07-08 NOTE — Telephone Encounter (Signed)
*  STAT* If patient is at the pharmacy, call can be transferred to refill team.   1. Which medications need to be refilled? (please list name of each medication and dose if known) furosemide (LASIX) 20 MG tablet  2. Which pharmacy/location (including street and city if local pharmacy) is medication to be sent to? [facility] [place], [place]  3. Do they need a 30 day or 90 day supply? [place]

## 2021-07-08 NOTE — Telephone Encounter (Signed)
Furosemide 20 mg was sent to pts pharmacy. Pt was notified that RX was sent to pharmacy.

## 2021-07-08 NOTE — Addendum Note (Signed)
Addended by: Derrick Ravel on: 18/01/9092 11:21 PM   Modules accepted: Orders

## 2021-07-12 ENCOUNTER — Telehealth: Payer: Self-pay

## 2021-07-12 DIAGNOSIS — S92344G Nondisplaced fracture of fourth metatarsal bone, right foot, subsequent encounter for fracture with delayed healing: Secondary | ICD-10-CM | POA: Diagnosis not present

## 2021-07-12 DIAGNOSIS — M79671 Pain in right foot: Secondary | ICD-10-CM | POA: Diagnosis not present

## 2021-07-12 NOTE — Telephone Encounter (Addendum)
Pt notified of below and verbalized understanding.  ----- Message from Melodye Ped, NP sent at 07/12/2021  3:30 PM EST ----- Regarding: RE: INR results today  2.3 That is good. We can keep her at that dosing schedule. ----- Message ----- From: Dairl Ponder, RN Sent: 07/12/2021   3:25 PM EST To: Melodye Ped, NP Subject: INR results today  2.3                         Pt's INR is 2.3 today. She takes Coumadin 6mg  on Tue, Thur and Sat; and Coumadin 5mg  other days.

## 2021-07-16 DIAGNOSIS — M810 Age-related osteoporosis without current pathological fracture: Secondary | ICD-10-CM | POA: Diagnosis not present

## 2021-07-16 DIAGNOSIS — M8589 Other specified disorders of bone density and structure, multiple sites: Secondary | ICD-10-CM | POA: Diagnosis not present

## 2021-07-16 LAB — HM DEXA SCAN

## 2021-07-19 DIAGNOSIS — M5459 Other low back pain: Secondary | ICD-10-CM | POA: Diagnosis not present

## 2021-07-19 DIAGNOSIS — M5030 Other cervical disc degeneration, unspecified cervical region: Secondary | ICD-10-CM | POA: Diagnosis not present

## 2021-07-19 DIAGNOSIS — M48061 Spinal stenosis, lumbar region without neurogenic claudication: Secondary | ICD-10-CM | POA: Insufficient documentation

## 2021-07-19 DIAGNOSIS — M48062 Spinal stenosis, lumbar region with neurogenic claudication: Secondary | ICD-10-CM | POA: Diagnosis not present

## 2021-07-19 DIAGNOSIS — M5412 Radiculopathy, cervical region: Secondary | ICD-10-CM | POA: Insufficient documentation

## 2021-07-19 DIAGNOSIS — Z7901 Long term (current) use of anticoagulants: Secondary | ICD-10-CM | POA: Diagnosis not present

## 2021-07-19 HISTORY — DX: Spinal stenosis, lumbar region without neurogenic claudication: M48.061

## 2021-07-19 HISTORY — DX: Radiculopathy, cervical region: M54.12

## 2021-07-26 ENCOUNTER — Telehealth: Payer: Self-pay

## 2021-07-26 NOTE — Telephone Encounter (Signed)
Called pt to make sure she knew her appointment is in [place] on Wednesday and to see if she wants to keep or cancel. No answer at this time, left message for her to return the call.

## 2021-07-28 ENCOUNTER — Ambulatory Visit (INDEPENDENT_AMBULATORY_CARE_PROVIDER_SITE_OTHER): Payer: Medicare PPO | Admitting: Cardiology

## 2021-07-28 ENCOUNTER — Encounter: Payer: Self-pay | Admitting: Cardiology

## 2021-07-28 ENCOUNTER — Other Ambulatory Visit: Payer: Self-pay | Admitting: Family Medicine

## 2021-07-28 ENCOUNTER — Other Ambulatory Visit: Payer: Self-pay

## 2021-07-28 VITALS — BP 142/72 | HR 79 | Ht 64.0 in | Wt 180.4 lb

## 2021-07-28 DIAGNOSIS — I5030 Unspecified diastolic (congestive) heart failure: Secondary | ICD-10-CM

## 2021-07-28 DIAGNOSIS — I110 Hypertensive heart disease with heart failure: Secondary | ICD-10-CM | POA: Diagnosis not present

## 2021-07-28 DIAGNOSIS — E782 Mixed hyperlipidemia: Secondary | ICD-10-CM

## 2021-07-28 DIAGNOSIS — I480 Paroxysmal atrial fibrillation: Secondary | ICD-10-CM

## 2021-07-28 DIAGNOSIS — I10 Essential (primary) hypertension: Secondary | ICD-10-CM | POA: Diagnosis not present

## 2021-07-28 DIAGNOSIS — I493 Ventricular premature depolarization: Secondary | ICD-10-CM

## 2021-07-28 DIAGNOSIS — I5032 Chronic diastolic (congestive) heart failure: Secondary | ICD-10-CM

## 2021-07-28 DIAGNOSIS — E1159 Type 2 diabetes mellitus with other circulatory complications: Secondary | ICD-10-CM | POA: Diagnosis not present

## 2021-07-28 MED ORDER — ISOSORBIDE MONONITRATE ER 30 MG PO TB24: 30.0000 mg | ORAL_TABLET | Freq: Every day | ORAL | 3 refills | Status: DC

## 2021-07-28 MED ORDER — FUROSEMIDE 40 MG PO TABS: 40.0000 mg | ORAL_TABLET | Freq: Every day | ORAL | 3 refills | Status: DC

## 2021-07-28 NOTE — Patient Instructions (Addendum)
Medication Instructions:  Your physician recommends that you continue on your current medications as directed. Please refer to the Current Medication list given to you today.  INCREASE: Lasix 40 mg once daily START: Imdur 30 mg once daily  *If you need a refill on your cardiac medications before your next appointment, please call your pharmacy*   Lab Work: None If you have labs (blood work) drawn today and your tests are completely normal, you will receive your results only by: [place] (if you have MyChart) OR A paper copy in the mail If you have any lab test that is abnormal or we need to change your treatment, we will call you to review the results.   Testing/Procedures: None   Follow-Up: At [facility], you and your health needs are our priority.  As part of our continuing mission to provide you with exceptional heart care, we have created designated Provider Care Teams.  These Care Teams include your primary Cardiologist (physician) and Advanced Practice Providers (APPs -  Physician Assistants and Nurse Practitioners) who all work together to provide you with the care you need, when you need it.  We recommend signing up for the patient portal called "MyChart".  Sign up information is provided on this After Visit Summary.  MyChart is used to connect with patients for Virtual Visits (Telemedicine).  Patients are able to view lab/test results, encounter notes, upcoming appointments, etc.  Non-urgent messages can be sent to your provider as well.   To learn more about what you can do with MyChart, go to [email or web address].    Your next appointment:   6 month(s)  The format for your next appointment:   In Person  Provider:   Berniece Salines, DO     Other Instructions

## 2021-07-28 NOTE — Progress Notes (Signed)
Cardiology Office Note:    Date:  07/30/2021   ID:  Pamela [name], DOB 23-Nov-1945, MRN [id number]  PCP:  Pamela Brome, MD  Cardiologist:  Pamela Salines, DO  Electrophysiologist:  None   Referring MD: Pamela Brome, MD    " I have had some chest pain"   History of Present Illness:    Pamela [name] is a 75 y.o. female with a hx of  Diabetes Mellitus, GERD, protein S deficiency chronic anticoagulation with Coumadin and has had DVT in the past, hypertension, hypercholesteremia, PVC, Paroxysmal atrial fibrillation history of a left heart catheterization with normal coronaries, history of TIA. She had a ORIF ankle fracture in June and status post open reduction internal fixation.   I last saw the patient in November 2021 at that time she was experiencing significant elevated blood pressure.  We discussed blood pressure control at that time I increased her lisinopril to 5 mg daily and she remain on her carvedilol 25 mg twice daily and her Lasix was twice daily as well.   I saw the patient on August 14, 2020 at that time she reports she has been experiencing some dizziness therefore I placed a monitor on the patient making sure that the symptoms were not caused due to an arrhythmia.  I also cut back on her Lasix from twice a day to once daily.   I saw her on 09/11/2020, at that time we increase the lisinopril to 20 mg daily. She is her today. She offers no complaints. She did read her blood pressures to me her systolic is averaging in the 150s to 160s.  She has had some dizziness but she did see ENT who said that this could be inner ear alignment issues.   I did see the patient via telemedicine on September 26, 2019 at that time she appeared to be doing well.  No medication changes were made.  I saw the patient Jan 25, 2021 at that time there have been changes to her medication prior to that visit.  Her carvedilol has been stopped her lisinopril was increased by her  primary care physician.  During that visit she appeared to be doing well from a cardiovascular standpoint we did not make any medication changes.  Her blood pressure was acceptable.  Since I saw the patient she has followed with endocrinology.  She has saw the patient she has been experiencing some chest discomfort.  She has tried some nitro which has helped.  Past Medical History:  Diagnosis Date   Anticoagulated on Coumadin    chronic--- managed by hematology/ oncology,   Asthma, mild intermittent    followed by pcp--- per pt last exacerbation winter 2021 w/ acute bronchitis   Bilateral leg cramps    Chronic constipation    CKD (chronic kidney disease), stage III ([place])    Closed bimalleolar fracture of left ankle 02/26/2020   DDD (degenerative disc disease), cervical    w/ spondylosis,  per pt last steroid injection 06/ 2022   DDD (degenerative disc disease), lumbosacral    DVT (deep venous thrombosis) ([facility]) 07/30/2013   Dyspnea    occasionally   GERD (gastroesophageal reflux disease)    History of cardiac murmur as a child    History of DVT of lower extremity    left lower extremity in 1980s, fell when bowling   History of pulmonary embolus (PE) 1993   per pt left lung post op 2 wks cholecystectomy   History of  rheumatic fever as a child    per last echo 12-31-2019 no valvular issues   History of TIA (transient ischemic attack)    2014 and 2018 or 2019,  per pt no residual   HTN (hypertension)    followed by pcp   Hyperlipidemia    IDA (iron deficiency anemia)    Macular degeneration of both eyes    Mild obstructive sleep apnea    per pt dx 2017 tried to uses cpap but intolerant   Mixed incontinence urge and stress    urologist--- dr Pamela [name]   OA (osteoarthritis) 07/06/2018   Osteoporosis    PAF (paroxysmal atrial fibrillation) ([place]) 10/01/2014   cardiologist--- dr Pamela [name] Dequincy Born;   cardiac cath 02-18-2013 normal coronaries arteries, ef 50%, cath done since echo showed ef  30-35%; nucleat stress study 04/ 2020 normal , normal echo 04/ 2021,  event monitor 09-14-2020 rare ST/AT variable block   PONV (postoperative nausea and vomiting)    Protein S deficiency ([place])    followed by hemotology/ oncology-- dr d. Bobby Rumpf ([facility]) dx 1980s;  prior DVT left lower leg 1980s and left lung PE 1993; chronic  coumadin since 1980s   PVC's (premature ventricular contractions)    followed by cardiology   S/P cardiac catheterization 02/2013   Normal coronaries; low normal EF at 50%   Solitary pulmonary nodule on lung CT 02/06/2019   First noted 01/13/2014 > no change as of 12/21/2018   Spondylolisthesis, lumbar region 08/09/2018   Stroke ([place])    TIA in 2018 or 2019   Transient ischemic attack 07/30/2013   Type 2 diabetes mellitus treated with insulin ([place])    endocrilogist--- Pamela reardon NP     (03-10-2021  pt continuously checks blood sugar throughout the day w/ Libre, fasting sugar --- 69--200)   Wears glasses    Wears hearing aid in both ears     Past Surgical History:  Procedure Laterality Date   CARDIAC CATHETERIZATION  02/18/2013   @ [place]  by Dr Martinique;  normal coronaries w/ preserved LVF, ef 50%;   previous cath 2001 normal ef 65%   CARPAL TUNNEL RELEASE Bilateral 1994   CATARACT EXTRACTION W/ INTRAOCULAR LENS IMPLANT Bilateral 2017   CHOLECYSTECTOMY, LAPAROSCOPIC  1993   COLONOSCOPY     FINGER SURGERY Left 2018   thumb   FOOT TENDON SURGERY Right    early 2000s   HARDWARE REMOVAL Left 03/12/2021   Procedure: HARDWARE REMOVAL;  Surgeon: Nicholes Stairs, MD;  Location[facility];  Service: Orthopedics;  Laterality: Left;  60 MINS   ORIF ANKLE FRACTURE Left 02/26/2020   Procedure: OPEN REDUCTION INTERNAL FIXATION (ORIF) ANKLE FRACTURE;  Surgeon: Nicholes Stairs, MD;  Location: [facility];  Service: Orthopedics;  Laterality: Left;  90 mins   TONSILLECTOMY  1950   TOTAL KNEE ARTHROPLASTY Left 03/2005   TOTAL VAGINAL  HYSTERECTOMY  1988   per pt still has ovaries    Current Medications: Current Meds  Medication Sig   albuterol (VENTOLIN HFA) 108 (90 Base) MCG/ACT inhaler Inhale 1-2 puffs into the lungs every 6 (six) hours as needed for wheezing or shortness of breath.   ASPIRIN LOW DOSE 81 MG EC tablet TAKE 1 TABLET(81 MG) BY MOUTH DAILY   atorvastatin (LIPITOR) 40 MG tablet Take 1 tablet (40 mg total) by mouth daily at 12 noon. (Patient taking differently: Take 40 mg by mouth daily with lunch.)   azelastine (ASTELIN) 0.1 % nasal  spray Place 2 sprays into both nostrils daily as needed for rhinitis or allergies.   B Complex-C (B-COMPLEX WITH VITAMIN C) tablet Take 1 tablet by mouth daily with lunch.   budesonide-formoterol (SYMBICORT) 80-4.5 MCG/ACT inhaler Inhale 2 puffs into the lungs 2 (two) times daily.   Cholecalciferol (VITAMIN D3) 50 MCG (2000 UT) TABS Take 2,000 Units by mouth daily with lunch.    Continuous Blood Gluc Receiver (FREESTYLE LIBRE 2 READER) DEVI 1 each by Does not apply route 4 (four) times daily - after meals and at bedtime. E11.40   Continuous Blood Gluc Sensor (FREESTYLE LIBRE 2 SENSOR) MISC 2 each by Does not apply route every 14 (fourteen) days. 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 Uses twice weekly   ferrous sulfate 325 (65 FE) MG tablet Take 325 mg by mouth daily with lunch.   furosemide (LASIX) 40 MG tablet Take 1 tablet (40 mg total) by mouth daily.   HYDROcodone-acetaminophen (NORCO/VICODIN) 5-325 MG tablet Take 1 tablet by mouth every 4 (four) hours as needed for moderate pain.   Insulin Aspart FlexPen (NOVOLOG) 100 UNIT/ML Inject 4-10 Units into the skin 3 (three) times daily with meals.   insulin glargine, 1 Unit Dial, (TOUJEO) 300 UNIT/ML Solostar Pen Inject 15 Units into the skin at bedtime.   Insulin Pen Needle (BD PEN NEEDLE NANO 2ND GEN) 32G X 4 MM MISC USE DAILY AS DIRECTED   ipratropium-albuterol  (DUONEB) 0.5-2.5 (3) MG/3ML SOLN Take 3 mLs by nebulization 4 (four) times daily as needed (wheezing/shortness of breath).   isosorbide mononitrate (IMDUR) 30 MG 24 hr tablet Take 1 tablet (30 mg total) by mouth daily.   lisinopril (ZESTRIL) 20 MG tablet TAKE 2 TABLETS(40 MG) BY MOUTH TWICE DAILY   magnesium oxide (MAG-OX) 400 (240 Mg) MG tablet TAKE 2 TABLETS(800 MG) BY MOUTH TWICE DAILY (Patient taking differently: Take 800 mg by mouth 2 (two) times daily.)   Menthol-Methyl Salicylate (SALONPAS PAIN RELIEF PATCH) PTCH Apply 1 patch topically daily as needed (pain.).   methocarbamol (ROBAXIN) 500 MG tablet Take 500 mg by mouth every 8 (eight) hours as needed for muscle spasms.   methocarbamol (ROBAXIN) 750 MG tablet Take 750 mg by mouth 3 (three) times daily.   ondansetron (ZOFRAN) 4 MG tablet Take 1 tablet (4 mg total) by mouth daily as needed for nausea or vomiting.   ondansetron (ZOFRAN-ODT) 8 MG disintegrating tablet Take 8 mg by mouth every 8 (eight) hours as needed for nausea or vomiting.    pantoprazole (PROTONIX) 40 MG tablet Take 1 tablet (40 mg total) by mouth every morning.   polyethylene glycol (MIRALAX / GLYCOLAX) 17 g packet Take 17 g by mouth as needed. Uses 1-2 times weekly   pregabalin (LYRICA) 75 MG capsule pregabalin 75 mg capsule  TAKE 1 CAPSULE BY MOUTH TWICE DAILY   promethazine-dextromethorphan (PROMETHAZINE-DM) 6.25-15 MG/5ML syrup Take 5 mLs by mouth 4 (four) times daily as needed for cough.   senna-docusate (SENOKOT-S) 8.6-50 MG tablet Take 1 tablet by mouth at bedtime.   sodium chloride (OCEAN) 0.65 % SOLN nasal spray Place 1-2 sprays into both nostrils 4 (four) times daily as needed for congestion.   trimethoprim (TRIMPEX) 100 MG tablet Take 100 mg by mouth at bedtime.   Vibegron (GEMTESA PO) Take by mouth at bedtime.   warfarin (COUMADIN) 1 MG tablet TAKE AS DIRECTED. SEE ADMIN INSTRUCTIONS.   warfarin (COUMADIN) 5 MG tablet TAKE  1 TABLET BY MOUTH EVERY DAY (Patient  taking differently: Take by mouth as directed.)   [DISCONTINUED] amLODipine (NORVASC) 5 MG tablet Take 1 tablet (5 mg total) by mouth daily.   [DISCONTINUED] furosemide (LASIX) 20 MG tablet Take 1 tablet (20 mg total) by mouth daily.     Allergies:   Ketek [telithromycin], Loxapine succinate, Naproxen sodium, Amoxapine and related, Darvon, Belsomra [suvorexant], Cymbalta [duloxetine hcl], Gabapentin, Lyrica [pregabalin], Amoxicillin, and Propoxyphene   Social History   Socioeconomic History   Marital status: Married    Spouse name: [name]   Number of children: Not on file   Years of education: Not on file   Highest education level: Not on file  Occupational History   Not on file  Tobacco Use   Smoking status: Never   Smokeless tobacco: Never  Vaping Use   Vaping Use: Never used  Substance and Sexual Activity   Alcohol use: No   Drug use: Never   Sexual activity: Not on file  Other Topics Concern   Not on file  Social History Narrative   Lives with spouse in [place].  2 grown daughters.   Retired Glass blower/designer     Social Determinants of Radio broadcast assistant Strain: Not on file  Food Insecurity: No Food Insecurity   Worried About Charity fundraiser in the Last Year: Never true   Arboriculturist in the Last Year: Never true  Transportation Needs: No Transportation Needs   Lack of Transportation (Medical): No   Lack of Transportation (Non-Medical): No  Physical Activity: Not on file  Stress: Not on file  Social Connections: Not on file     Family History: The patient's family history includes Antithrombin III deficiency in her mother; Cirrhosis in her mother; Coronary artery disease in her father; Heart attack in her brother, father, and sister; Hypertension in her brother, father, and sister; Lung cancer in her brother; Protein S deficiency in her daughter and sister; Ulcerative colitis in her mother. There is no history of Stroke, Colitis, Colon polyps,  Esophageal cancer, Liver cancer, Pancreatic cancer, Rectal cancer, or Stomach cancer.  ROS:   Review of Systems  Constitution: Negative for decreased appetite, fever and weight gain.  HENT: Negative for congestion, ear discharge, hoarse voice and sore throat.   Eyes: Negative for discharge, redness, vision loss in right eye and visual halos.  Cardiovascular: Negative for chest pain, dyspnea on exertion, leg swelling, orthopnea and palpitations.  Respiratory: Negative for cough, hemoptysis, shortness of breath and snoring.   Endocrine: Negative for heat intolerance and polyphagia.  Hematologic/Lymphatic: Negative for bleeding problem. Does not bruise/bleed easily.  Skin: Negative for flushing, nail changes, rash and suspicious lesions.  Musculoskeletal: Negative for arthritis, joint pain, muscle cramps, myalgias, neck pain and stiffness.  Gastrointestinal: Negative for abdominal pain, bowel incontinence, diarrhea and excessive appetite.  Genitourinary: Negative for decreased libido, genital sores and incomplete emptying.  Neurological: Negative for brief paralysis, focal weakness, headaches and loss of balance.  Psychiatric/Behavioral: Negative for altered mental status, depression and suicidal ideas.  Allergic/Immunologic: Negative for HIV exposure and persistent infections.    EKGs/Labs/Other Studies Reviewed:    The following studies were reviewed today:   EKG: None today   Echocardiogram IMPRESSIONS   1. Left ventricular ejection fraction, by estimation, is 60 to 65%. The left ventricle has normal function. The left ventricle has no regional wall motion abnormalities. There is moderate concentric left ventricular hypertrophy. Left ventricular  diastolic parameters  are consistent with Grade I diastolic dysfunction (impaired relaxation).   2. Right ventricular systolic function is normal. The right ventricular size is normal. There is normal pulmonary artery systolic pressure.   3.  Left atrial size was mildly dilated.   4. The mitral valve is normal in structure. Trivial mitral valve regurgitation. No evidence of mitral stenosis.   5. The aortic valve is tricuspid. Aortic valve regurgitation is not visualized. No aortic stenosis is present.   6. The inferior vena cava is normal in size with greater than 50% respiratory variability, suggesting right atrial pressure of 3 mmHg.   Recent Labs: 09/11/2020: Magnesium 1.7 06/15/2021: ALT 27; BUN 38; Creatinine, Ser 1.41; Hemoglobin 13.1; Platelets 323; Potassium 4.9; Sodium 135  Recent Lipid Panel    Component Value Date/Time   CHOL 148 06/15/2021 0933   TRIG 65 06/15/2021 0933   HDL 69 06/15/2021 0933   CHOLHDL 2.1 06/15/2021 0933   LDLCALC 66 06/15/2021 0933    Physical Exam:    VS:  BP (!) 160/70 (BP Location: Right Arm)   Pulse 79   Ht 5\' 4"  (1.626 m)   Wt 180 lb 6.4 oz (81.8 kg)   SpO2 98%   BMI 30.97 kg/m     Wt Readings from Last 3 Encounters:  07/28/21 180 lb 6.4 oz (81.8 kg)  06/29/21 189 lb (85.7 kg)  06/15/21 186 lb (84.4 kg)     GEN: Well nourished, well developed in no acute distress HEENT: Normal NECK: No JVD; No carotid bruits LYMPHATICS: No lymphadenopathy CARDIAC: S1S2 noted,RRR, no murmurs, rubs, gallops RESPIRATORY:  Clear to auscultation without rales, wheezing or rhonchi  ABDOMEN: Soft, non-tender, non-distended, +bowel sounds, no guarding. EXTREMITIES: No edema, No cyanosis, no clubbing MUSCULOSKELETAL:  No deformity  SKIN: Warm and dry NEUROLOGIC:  Alert and oriented x 3, non-focal PSYCHIATRIC:  Normal affect, good insight  ASSESSMENT:    1. Essential hypertension   2. Ventricular premature beats   3. Paroxysmal atrial fibrillation (HCC)   4. Type 2 diabetes mellitus with vascular disease ([place])   5. Chronic diastolic congestive heart failure ([place])   6. 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 Mixed hyperlipidemia    PLAN:     She had  a low risk pharmacological nuclear stress test in April 2020 with no evidence of ischemia.  She has had very intermittent chest discomfort.  I like to try medicines first with adding Imdur 30 mg to her regimen.  If no improvement with her symptoms we will repeat functional ischemic imaging.  I did take her blood pressure manually which was over 140/72.  Which seems to be closer to where her average is at home but she has had some episodes of blood pressure higher in also significantly lower.  We will closely monitor the patient on the addition with her Imdur.  Her diabetes is being managed by endocrinology.  Due to the distance she is considering another endocrinology group I am going to refer her to endocrinology here in [place].  Clinically she appears to be euvolemic.  We will continue her diuretics her Lasix will be 40 mg daily.  She recently had blood work which was done by her PCP no need for repeat at this time.  Continue her Lipitor.  The patient is in agreement with the above plan. The patient left the office in stable condition.  The patient will follow up in 6 months or sooner if needed.  Medication Adjustments/Labs and Tests Ordered: Current medicines are reviewed at length with the patient today.  Concerns regarding medicines are outlined above.  No orders of the defined types were placed in this encounter.  Meds ordered this encounter  Medications   isosorbide mononitrate (IMDUR) 30 MG 24 hr tablet    Sig: Take 1 tablet (30 mg total) by mouth daily.    Dispense:  90 tablet    Refill:  3   furosemide (LASIX) 40 MG tablet    Sig: Take 1 tablet (40 mg total) by mouth daily.    Dispense:  90 tablet    Refill:  3    Patient Instructions  Medication Instructions:  Your physician recommends that you continue on your current medications as directed. Please refer to the Current Medication list given to you today.  INCREASE: Lasix 40 mg once daily START: Imdur 30 mg once  daily  *If you need a refill on your cardiac medications before your next appointment, please call your pharmacy*   Lab Work: None If you have labs (blood work) drawn today and your tests are completely normal, you will receive your results only by: [facility] (if you have MyChart) OR A paper copy in the mail If you have any lab test that is abnormal or we need to change your treatment, we will call you to review the results.   Testing/Procedures: None   Follow-Up: At [facility], you and your health needs are our priority.  As part of our continuing mission to provide you with exceptional heart care, we have created designated Provider Care Teams.  These Care Teams include your primary Cardiologist (physician) and Advanced Practice Providers (APPs -  Physician Assistants and Nurse Practitioners) who all work together to provide you with the care you need, when you need it.  We recommend signing up for the patient portal called "MyChart".  Sign up information is provided on this After Visit Summary.  MyChart is used to connect with patients for Virtual Visits (Telemedicine).  Patients are able to view lab/test results, encounter notes, upcoming appointments, etc.  Non-urgent messages can be sent to your provider as well.   To learn more about what you can do with MyChart, go to [email or web address].    Your next appointment:   6 month(s)  The format for your next appointment:   In Person  Provider:   Berniece Salines, DO     Other Instructions     Adopting a Healthy Lifestyle.  Know what a healthy weight is for you (roughly BMI <25) and aim to maintain this   Aim for 7+ servings of fruits and vegetables daily   65-80+ fluid ounces of water or unsweet tea for healthy kidneys   Limit to max 1 drink of alcohol per day; avoid smoking/tobacco   Limit animal fats in diet for cholesterol and heart health - choose grass fed whenever available   Avoid highly processed  foods, and foods high in saturated/trans fats   Aim for low stress - take time to unwind and care for your mental health   Aim for 150 min of moderate intensity exercise weekly for heart health, and weights twice weekly for bone health   Aim for 7-9 hours of sleep daily   When it comes to diets, agreement about the perfect plan isnt easy to find, even among the experts. Experts at the [place] developed an idea known as the Healthy Eating Plate. Just imagine a  plate divided into logical, healthy portions.   The emphasis is on diet quality:   Load up on vegetables and fruits - one-half of your plate: Aim for color and variety, and remember that potatoes dont count.   Go for whole grains - one-quarter of your plate: Whole wheat, barley, wheat berries, quinoa, oats, brown rice, and foods made with them. If you want pasta, go with whole wheat pasta.   Protein power - one-quarter of your plate: Fish, chicken, beans, and nuts are all healthy, versatile protein sources. Limit red meat.   The diet, however, does go beyond the plate, offering a few other suggestions.   Use healthy plant oils, such as olive, canola, soy, corn, sunflower and peanut. Check the labels, and avoid partially hydrogenated oil, which have unhealthy trans fats.   If youre thirsty, drink water. Coffee and tea are good in moderation, but skip sugary drinks and limit milk and dairy products to one or two daily servings.   The type of carbohydrate in the diet is more important than the amount. Some sources of carbohydrates, such as vegetables, fruits, whole grains, and beans-are healthier than others.   Finally, stay active  Signed, Pamela Salines, DO  07/30/2021 9:29 AM    [facility]

## 2021-07-30 ENCOUNTER — Encounter: Payer: Self-pay | Admitting: Cardiology

## 2021-08-03 ENCOUNTER — Telehealth: Payer: Self-pay | Admitting: Hematology and Oncology

## 2021-08-03 NOTE — Telephone Encounter (Signed)
Spoke with patient regarding home INR 2.8 on 08/02/21. Will have her continue the same Coumadin 6 mg T,Th,Sat and 5 mg rest of days. Will repeat home INR on 08/16/21. Patient verbalized understanding.

## 2021-08-10 ENCOUNTER — Encounter: Payer: Self-pay | Admitting: Nurse Practitioner

## 2021-08-10 ENCOUNTER — Ambulatory Visit (INDEPENDENT_AMBULATORY_CARE_PROVIDER_SITE_OTHER): Payer: Medicare PPO | Admitting: Nurse Practitioner

## 2021-08-10 VITALS — BP 109/65 | HR 73 | Ht 64.0 in | Wt 180.0 lb

## 2021-08-10 DIAGNOSIS — E1122 Type 2 diabetes mellitus with diabetic chronic kidney disease: Secondary | ICD-10-CM | POA: Diagnosis not present

## 2021-08-10 DIAGNOSIS — E782 Mixed hyperlipidemia: Secondary | ICD-10-CM | POA: Diagnosis not present

## 2021-08-10 DIAGNOSIS — Z794 Long term (current) use of insulin: Secondary | ICD-10-CM

## 2021-08-10 DIAGNOSIS — I10 Essential (primary) hypertension: Secondary | ICD-10-CM

## 2021-08-10 DIAGNOSIS — N1832 Chronic kidney disease, stage 3b: Secondary | ICD-10-CM

## 2021-08-10 NOTE — Patient Instructions (Signed)

## 2021-08-10 NOTE — Progress Notes (Signed)
Endocrinology Follow Up Note       08/10/2021, 9:30 AM   Subjective:    Patient ID: Pamela [name], female    DOB: 1946/02/02.  Pamela [name] is being seen in follow up after being seen in consultation for management of currently uncontrolled symptomatic diabetes requested by  [name], MD.   Past Medical History:  Diagnosis Date   Anticoagulated on Coumadin    chronic--- managed by hematology/ oncology,   Asthma, mild intermittent    followed by pcp--- per pt last exacerbation winter 2021 w/ acute bronchitis   Bilateral leg cramps    Chronic constipation    CKD (chronic kidney disease), stage III ([place])    Closed bimalleolar fracture of left ankle 02/26/2020   DDD (degenerative disc disease), cervical    w/ spondylosis,  per pt last steroid injection 06/ 2022   DDD (degenerative disc disease), lumbosacral    DVT (deep venous thrombosis) ([place]) 07/30/2013   Dyspnea    occasionally   GERD (gastroesophageal reflux disease)    History of cardiac murmur as a child    History of DVT of lower extremity    left lower extremity in 1980s, fell when bowling   History of pulmonary embolus (PE) 1993   per pt left lung post op 2 wks cholecystectomy   History of rheumatic fever as a child    per last echo 12-31-2019 no valvular issues   History of TIA (transient ischemic attack)    2014 and 2018 or 2019,  per pt no residual   HTN (hypertension)    followed by pcp   Hyperlipidemia    IDA (iron deficiency anemia)    Macular degeneration of both eyes    Mild obstructive sleep apnea    per pt dx 2017 tried to uses cpap but intolerant   Mixed incontinence urge and stress    urologist--- dr Nila Nephew   OA (osteoarthritis) 07/06/2018   Osteoporosis    PAF (paroxysmal atrial fibrillation) ([place]) 10/01/2014   cardiologist--- dr Godfrey Pick tobb;   cardiac cath 02-18-2013 normal coronaries arteries, ef  50%, cath done since echo showed ef 30-35%; nucleat stress study 04/ 2020 normal , normal echo 04/ 2021,  event monitor 09-14-2020 rare ST/AT variable block   PONV (postoperative nausea and vomiting)    Protein S deficiency ([place])    followed by hemotology/ oncology-- dr d. Bobby Rumpf ([facility]) dx 1980s;  prior DVT left lower leg 1980s and left lung PE 1993; chronic  coumadin since 1980s   PVC's (premature ventricular contractions)    followed by cardiology   S/P cardiac catheterization 02/2013   Normal coronaries; low normal EF at 50%   Solitary pulmonary nodule on lung CT 02/06/2019   First noted 01/13/2014 > no change as of 12/21/2018   Spondylolisthesis, lumbar region 08/09/2018   Stroke ([place])    TIA in 2018 or 2019   Transient ischemic attack 07/30/2013   Type 2 diabetes mellitus treated with insulin ([place])    endocrilogist--- Jasaun Carn NP     (03-10-2021  pt continuously checks blood sugar throughout the day w/ Libre, fasting sugar --- 69--200)  Wears glasses    Wears hearing aid in both ears     Past Surgical History:  Procedure Laterality Date   CARDIAC CATHETERIZATION  02/18/2013   @ [facility]  by Dr Martinique;  normal coronaries w/ preserved LVF, ef 50%;   previous cath 2001 normal ef 65%   CARPAL TUNNEL RELEASE Bilateral 1994   CATARACT EXTRACTION W/ INTRAOCULAR LENS IMPLANT Bilateral 2017   CHOLECYSTECTOMY, LAPAROSCOPIC  1993   COLONOSCOPY     FINGER SURGERY Left 2018   thumb   FOOT TENDON SURGERY Right    early 2000s   HARDWARE REMOVAL Left 03/12/2021   Procedure: HARDWARE REMOVAL;  Surgeon: Nicholes Stairs, MD;  Location: [facility];  Service: Orthopedics;  Laterality: Left;  60 MINS   ORIF ANKLE FRACTURE Left 02/26/2020   Procedure: OPEN REDUCTION INTERNAL FIXATION (ORIF) ANKLE FRACTURE;  Surgeon: Nicholes Stairs, MD;  Location: [facility];  Service: Orthopedics;  Laterality: Left;  90 mins   TONSILLECTOMY  1950   TOTAL KNEE ARTHROPLASTY  Left 03/2005   TOTAL VAGINAL HYSTERECTOMY  1988   per pt still has ovaries    Social History   Socioeconomic History   Marital status: Married    Spouse name: [name]   Number of children: Not on file   Years of education: Not on file   Highest education level: Not on file  Occupational History   Not on file  Tobacco Use   Smoking status: Never   Smokeless tobacco: Never  Vaping Use   Vaping Use: Never used  Substance and Sexual Activity   Alcohol use: No   Drug use: Never   Sexual activity: Not on file  Other Topics Concern   Not on file  Social History Narrative   Lives with spouse in [place].  2 grown daughters.   Retired Glass blower/designer     Social Determinants of Radio broadcast assistant Strain: Not on file  Food Insecurity: No Food Insecurity   Worried About Charity fundraiser in the Last Year: Never true   Arboriculturist in the Last Year: Never true  Transportation Needs: No Transportation Needs   Lack of Transportation (Medical): No   Lack of Transportation (Non-Medical): No  Physical Activity: Not on file  Stress: Not on file  Social Connections: Not on file    Family History  Problem Relation Age of Onset   Cirrhosis Mother    Antithrombin III deficiency Mother        multiple emboli   Ulcerative colitis Mother    Coronary artery disease Father    Heart attack Father    Hypertension Father    Protein S deficiency Sister        prior DVT   Protein S deficiency Daughter    Hypertension Sister    Hypertension Brother    Lung cancer Brother    Heart attack Brother    Heart attack Sister        age 64    Stroke Neg Hx    Colitis Neg Hx    Colon polyps Neg Hx    Esophageal cancer Neg Hx    Liver cancer Neg Hx    Pancreatic cancer Neg Hx    Rectal cancer Neg Hx    Stomach cancer Neg Hx     Outpatient Encounter Medications as of 08/10/2021  Medication Sig   albuterol (VENTOLIN HFA) 108 (90 Base) MCG/ACT inhaler Inhale 1-2 puffs into the  lungs every 6 (six) hours as needed for wheezing or shortness of breath.   amLODipine (NORVASC) 5 MG tablet TAKE 1 TABLET(5 MG) BY MOUTH DAILY   ASPIRIN LOW DOSE 81 MG EC tablet TAKE 1 TABLET(81 MG) BY MOUTH DAILY   atorvastatin (LIPITOR) 40 MG tablet Take 1 tablet (40 mg total) by mouth daily at 12 noon. (Patient taking differently: Take 40 mg by mouth daily with lunch.)   azelastine (ASTELIN) 0.1 % nasal spray Place 2 sprays into both nostrils daily as needed for rhinitis or allergies.   B Complex-C (B-COMPLEX WITH VITAMIN C) tablet Take 1 tablet by mouth daily with lunch.   budesonide-formoterol (SYMBICORT) 80-4.5 MCG/ACT inhaler Inhale 2 puffs into the lungs 2 (two) times daily.   Cholecalciferol (VITAMIN D3) 50 MCG (2000 UT) TABS Take 2,000 Units by mouth daily with lunch.    Continuous Blood Gluc Receiver (FREESTYLE LIBRE 2 READER) DEVI 1 each by Does not apply route 4 (four) times daily - after meals and at bedtime. E11.40   Continuous Blood Gluc Sensor (FREESTYLE LIBRE 2 SENSOR) MISC 2 each by Does not apply route every 14 (fourteen) days. 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 Uses twice weekly   ferrous sulfate 325 (65 FE) MG tablet Take 325 mg by mouth daily with lunch.   furosemide (LASIX) 40 MG tablet Take 1 tablet (40 mg total) by mouth daily.   HYDROcodone-acetaminophen (NORCO/VICODIN) 5-325 MG tablet Take 1 tablet by mouth every 4 (four) hours as needed for moderate pain.   Insulin Aspart FlexPen (NOVOLOG) 100 UNIT/ML Inject 4-10 Units into the skin 3 (three) times daily with meals.   insulin glargine, 1 Unit Dial, (TOUJEO) 300 UNIT/ML Solostar Pen Inject 15 Units into the skin at bedtime.   Insulin Pen Needle (BD PEN NEEDLE NANO 2ND GEN) 32G X 4 MM MISC USE DAILY AS DIRECTED   ipratropium-albuterol (DUONEB) 0.5-2.5 (3) MG/3ML SOLN Take 3 mLs by nebulization 4 (four) times daily as needed (wheezing/shortness of  breath).   isosorbide mononitrate (IMDUR) 30 MG 24 hr tablet Take 1 tablet (30 mg total) by mouth daily.   lisinopril (ZESTRIL) 20 MG tablet TAKE 2 TABLETS(40 MG) BY MOUTH TWICE DAILY   magnesium oxide (MAG-OX) 400 (240 Mg) MG tablet TAKE 2 TABLETS(800 MG) BY MOUTH TWICE DAILY (Patient taking differently: Take 800 mg by mouth 2 (two) times daily.)   Menthol-Methyl Salicylate (SALONPAS PAIN RELIEF PATCH) PTCH Apply 1 patch topically daily as needed (pain.).   methocarbamol (ROBAXIN) 750 MG tablet Take 750 mg by mouth 3 (three) times daily.   ondansetron (ZOFRAN-ODT) 8 MG disintegrating tablet Take 8 mg by mouth every 8 (eight) hours as needed for nausea or vomiting.    pantoprazole (PROTONIX) 40 MG tablet Take 1 tablet (40 mg total) by mouth every morning.   polyethylene glycol (MIRALAX / GLYCOLAX) 17 g packet Take 17 g by mouth as needed. Uses 1-2 times weekly   pregabalin (LYRICA) 75 MG capsule pregabalin 75 mg capsule  TAKE 1 CAPSULE BY MOUTH TWICE DAILY (Patient not taking: Reported on 08/10/2021)   promethazine-dextromethorphan (PROMETHAZINE-DM) 6.25-15 MG/5ML syrup Take 5 mLs by mouth 4 (four) times daily as needed for cough.   senna-docusate (SENOKOT-S) 8.6-50 MG tablet Take 1 tablet by mouth at bedtime.   sodium chloride (OCEAN) 0.65 % SOLN nasal spray Place 1-2 sprays into both nostrils 4 (four) times daily as needed for congestion.  trimethoprim (TRIMPEX) 100 MG tablet Take 100 mg by mouth at bedtime.   Vibegron (GEMTESA PO) Take by mouth at bedtime.   warfarin (COUMADIN) 1 MG tablet TAKE AS DIRECTED. SEE ADMIN INSTRUCTIONS.   warfarin (COUMADIN) 5 MG tablet TAKE 1 TABLET BY MOUTH EVERY DAY (Patient taking differently: Take by mouth as directed.)   [DISCONTINUED] methocarbamol (ROBAXIN) 500 MG tablet Take 500 mg by mouth every 8 (eight) hours as needed for muscle spasms.   [DISCONTINUED] ondansetron (ZOFRAN) 4 MG tablet Take 1 tablet (4 mg total) by mouth daily as needed for nausea or  vomiting.   No facility-administered encounter medications on file as of 08/10/2021.    ALLERGIES: Allergies  Allergen Reactions   Ketek [Telithromycin] Nausea And Vomiting and Rash   Loxapine Succinate Hives   Naproxen Sodium Anaphylaxis and Hives   Amoxapine And Related Hives   Darvon Nausea And Vomiting   Belsomra [Suvorexant] Other (See Comments)    "Nightmares"   Cymbalta [Duloxetine Hcl] Other (See Comments)    "Blackouts"   Gabapentin Other (See Comments)    Blurry vision   Lyrica [Pregabalin]     Makes her sedated   Amoxicillin Rash   Propoxyphene Nausea And Vomiting    VACCINATION STATUS: Immunization History  Administered Date(s) Administered   Fluad Quad(high Dose 65+) 05/26/2020, 05/13/2021   Influenza,inj,Quad PF,6+ Mos 06/04/2013   PFIZER Comirnaty(Gray Top)Covid-19 Tri-Sucrose Vaccine 02/04/2021   PFIZER(Purple Top)SARS-COV-2 Vaccination 09/22/2019, 10/12/2019, 06/16/2020   Pneumococcal Conjugate-13 07/30/2014   Pneumococcal Polysaccharide-23 03/31/2008   Tdap 07/25/2011   Zoster, Live 03/31/2008, 06/03/2011    Diabetes She presents for her follow-up diabetic visit. She has type 2 diabetes mellitus. Onset time: diagnosed at approx age of 72. Her disease course has been stable. Hypoglycemia symptoms include sweats and tremors. Pertinent negatives for hypoglycemia include no nervousness/anxiousness. Associated symptoms include foot paresthesias. There are no hypoglycemic complications. Symptoms are stable. Diabetic complications include a CVA, heart disease, nephropathy and peripheral neuropathy. Risk factors for coronary artery disease include diabetes mellitus, dyslipidemia, family history, hypertension, post-menopausal and sedentary lifestyle. Current diabetic treatment includes intensive insulin program. She is compliant with treatment most of the time. Her weight is fluctuating minimally. She is following a generally healthy diet. When asked about meal  planning, she reported none. She has not had a previous visit with a dietitian. She rarely participates in exercise. Her home blood glucose trend is fluctuating minimally. Her overall blood glucose range is 140-180 mg/dl. (She presents today with her CGM, no logs, showing stable glycemic profile overall.  Her most recent A1c on 06/15/21 was 7.5%, at goal for her.  Analysis of her CGM shows TIR 68%, TAR 32%, TBR 0% with GMI of 7.2%.  She denies any significant hypoglycemia.  She still struggles with eating on time.) An ACE inhibitor/angiotensin II receptor blocker is being taken. She does not see a podiatrist.Eye exam is current.  Hyperlipidemia This is a chronic problem. The current episode started more than 1 year ago. The problem is controlled. Recent lipid tests were reviewed and are normal. Exacerbating diseases include chronic renal disease and diabetes. Factors aggravating her hyperlipidemia include beta blockers and fatty foods. Current antihyperlipidemic treatment includes statins. The current treatment provides significant improvement of lipids. Compliance problems include adherence to exercise.  Risk factors for coronary artery disease include diabetes mellitus, dyslipidemia, hypertension, post-menopausal and a sedentary lifestyle.  Hypertension This is a chronic problem. The current episode started more than 1 year ago. The problem has been  resolved since onset. The problem is controlled. Associated symptoms include sweats. There are no associated agents to hypertension. Risk factors for coronary artery disease include diabetes mellitus, dyslipidemia, family history, post-menopausal state and sedentary lifestyle. Past treatments include beta blockers, diuretics and ACE inhibitors. The current treatment provides moderate improvement. There are no compliance problems.  Hypertensive end-organ damage includes kidney disease, CVA and heart failure. Identifiable causes of hypertension include chronic renal  disease and sleep apnea.    Review of systems  Constitutional: + Minimally fluctuating body weight,  current Body mass index is 30.9 kg/m. , no fatigue, no subjective hyperthermia, no subjective hypothermia Eyes: no blurry vision, no xerophthalmia ENT: no sore throat, no nodules palpated in throat, no dysphagia/odynophagia, no hoarseness Cardiovascular: no chest pain, no shortness of breath, no palpitations, no leg swelling Respiratory: no cough, no shortness of breath Gastrointestinal: no nausea/vomiting/diarrhea Musculoskeletal: chronic neck and back pain; walks with cane Skin: no rashes, no hyperemia Neurological: no tremors, no numbness, no tingling, no dizziness Psychiatric: no depression, no anxiety    Objective:     BP 109/65   Pulse 73   Ht 5\' 4"  (1.626 m)   Wt 180 lb (81.6 kg)   BMI 30.90 kg/m   Wt Readings from Last 3 Encounters:  08/10/21 180 lb (81.6 kg)  07/28/21 180 lb 6.4 oz (81.8 kg)  06/29/21 189 lb (85.7 kg)     BP Readings from Last 3 Encounters:  08/10/21 109/65  07/28/21 (!) 142/72  06/29/21 122/60      Physical Exam- Limited  Constitutional:  Body mass index is 30.9 kg/m. , not in acute distress, normal state of mind Eyes:  EOMI, no exophthalmos Neck: Supple Cardiovascular: RRR, no murmurs, rubs, or gallops, no edema Respiratory: Adequate breathing efforts, no crackles, rales, rhonchi, or wheezing Musculoskeletal: no gross deformities, strength intact in all four extremities, no gross restriction of joint movements, walks with cane Skin:  no rashes, no hyperemia Neurological: no tremor with outstretched hands     CMP ( most recent) CMP     Component Value Date/Time   NA 135 06/15/2021 0933   K 4.9 06/15/2021 0933   CL 96 06/15/2021 0933   CO2 23 06/15/2021 0933   GLUCOSE 164 (H) 06/15/2021 0933   GLUCOSE 132 (H) 03/10/2020 1646   BUN 38 (H) 06/15/2021 0933   CREATININE 1.41 (H) 06/15/2021 0933   CALCIUM 9.3 06/15/2021 0933    PROT 7.2 06/15/2021 0933   ALBUMIN 4.4 06/15/2021 0933   AST 23 06/15/2021 0933   ALT 27 06/15/2021 0933   ALKPHOS 73 06/15/2021 0933   BILITOT 0.3 06/15/2021 0933   GFRNONAA 61 09/11/2020 1123   GFRAA 70 09/11/2020 1123     Diabetic Labs (most recent): Lab Results  Component Value Date   HGBA1C 7.5 (H) 06/15/2021   HGBA1C 7.2 (H) 03/10/2021   HGBA1C 9.8 (H) 11/12/2020     Lipid Panel ( most recent) Lipid Panel     Component Value Date/Time   CHOL 148 06/15/2021 0933   TRIG 65 06/15/2021 0933   HDL 69 06/15/2021 0933   CHOLHDL 2.1 06/15/2021 0933   LDLCALC 66 06/15/2021 0933   LABVLDL 13 06/15/2021 0933      Lab Results  Component Value Date   TSH 2.020 11/14/2019   TSH 1.24 02/05/2019   FREET4 1.23 01/21/2013           Assessment & Plan:   1) Type 2 diabetes mellitus with stage 3b  chronic kidney disease, with long-term current use of insulin ([place])  She presents today with her CGM, no logs, showing stable glycemic profile overall.  Her most recent A1c on 06/15/21 was 7.5%, at goal for her.  Analysis of her CGM shows TIR 68%, TAR 32%, TBR 0% with GMI of 7.2%.  She denies any significant hypoglycemia.  She still struggles with eating on time.  - Pamela [name] has currently uncontrolled symptomatic type 2 DM since 75 years of age.  -Recent labs reviewed.  - I had a long discussion with her about the progressive nature of diabetes and the pathology behind its complications. -her diabetes is complicated by CKD stage 3, CVA and she remains at a high risk for more acute and chronic complications which include CAD, CVA, CKD, retinopathy, and neuropathy. These are all discussed in detail with her.  - Nutritional counseling repeated at each appointment due to patients tendency to fall back in to old habits.  - The patient admits there is a room for improvement in their diet and drink choices. -  Suggestion is made for the patient to avoid simple  carbohydrates from their diet including Cakes, Sweet Desserts / Pastries, Ice Cream, Soda (diet and regular), Sweet Tea, Candies, Chips, Cookies, Sweet Pastries, Store Bought Juices, Alcohol in Excess of 1-2 drinks a day, Artificial Sweeteners, Coffee Creamer, and "Sugar-free" Products. This will help patient to have stable blood glucose profile and potentially avoid unintended weight gain.   - I encouraged the patient to switch to unprocessed or minimally processed complex starch and increased protein intake (animal or plant source), fruits, and vegetables.   - Patient is advised to stick to a routine mealtimes to eat 3 meals a day and avoid unnecessary snacks (to snack only to correct hypoglycemia).  - I have approached her with the following individualized plan to manage  her diabetes and patient agrees:   -Based on her stable glycemic profile, she is advised to continue her Toujeo at 15 units SQ nightly and continue her Novolog at 4-10 units SQ TID with meals if glucose is above 100 and she is eating (Specific instructions on how to titrate insulin dosage based on glucose readings given to patient in writing).   -she is encouraged to continue monitoring glucose 4 times daily using her CGM, before meals and before bed, and to call the clinic if she has readings less than 70 or greater than 200 for 3 tests in a row.  - she is warned not to take insulin without proper monitoring per orders. - Adjustment parameters are given to her for hypo and hyperglycemia in writing.  - Specific targets for  A1c;  LDL, HDL,  and Triglycerides were discussed with the patient.  2) Blood Pressure /Hypertension:  her blood pressure is controlled to target.   she is advised to continue her current medications including Lasix 40 mg po daily, Norvasc 5 mg po daily, Lisinopril 40 mg po twice daily.  3) Lipids/Hyperlipidemia:    Review of her recent lipid panel from 06/15/21 showed controlled LDL at 66 .  she  is  advised to continue Lipitor 40 mg daily at bedtime.  Side effects and precautions discussed with her.  4)  Weight/Diet:  her Body mass index is 30.9 kg/m.  -   she is not a candidate for major weight loss.  Exercise, and detailed carbohydrates information provided  -  detailed on discharge instructions.  5) Chronic Care/Health Maintenance: -she is on  ACEI/ARB and Statin medications and is encouraged to initiate and continue to follow up with Ophthalmology, Dentist, Podiatrist at least yearly or according to recommendations, and advised to stay away from smoking. I have recommended yearly flu vaccine and pneumonia vaccine at least every 5 years; moderate intensity exercise for up to 150 minutes weekly; and sleep for at least 7 hours a day.  - she is advised to maintain close follow up with [facility], Elnita Maxwell, MD for primary care needs, as well as her other providers for optimal and coordinated care.      I spent 30 minutes in the care of the patient today including review of labs from [place], Lipids, Thyroid Function, Hematology (current and previous including abstractions from other facilities); face-to-face time discussing  her blood glucose readings/logs, discussing hypoglycemia and hyperglycemia episodes and symptoms, medications doses, her options of short and long term treatment based on the latest standards of care / guidelines;  discussion about incorporating lifestyle medicine;  and documenting the encounter.    Please refer to Patient Instructions for Blood Glucose Monitoring and Insulin/Medications Dosing Guide"  in media tab for additional information. Please  also refer to " Patient Self Inventory" in the Media  tab for reviewed elements of pertinent patient history.  Pamela [name] participated in the discussions, expressed understanding, and voiced agreement with the above plans.  All questions were answered to her satisfaction. she is encouraged to contact clinic should she  have any questions or concerns prior to her return visit.   Follow up plan: - Return in about 4 months (around 12/09/2021) for Diabetes F/U with A1c in office, No previsit labs, Bring meter and logs.   Rayetta Pigg, [facility] [address] [place], [place] [address] Phone: [phone] Fax: [phone]  08/10/2021, 9:30 AM

## 2021-08-12 ENCOUNTER — Encounter: Payer: Self-pay | Admitting: Family Medicine

## 2021-08-13 DIAGNOSIS — M5416 Radiculopathy, lumbar region: Secondary | ICD-10-CM

## 2021-08-13 HISTORY — DX: Radiculopathy, lumbar region: M54.16

## 2021-08-17 ENCOUNTER — Ambulatory Visit (INDEPENDENT_AMBULATORY_CARE_PROVIDER_SITE_OTHER): Payer: Medicare PPO

## 2021-08-17 DIAGNOSIS — Z23 Encounter for immunization: Secondary | ICD-10-CM

## 2021-08-19 DIAGNOSIS — M5416 Radiculopathy, lumbar region: Secondary | ICD-10-CM | POA: Diagnosis not present

## 2021-08-20 ENCOUNTER — Telehealth: Payer: Self-pay

## 2021-08-20 NOTE — Chronic Care Management (AMB) (Signed)
Updated [place] report with last AWV.  Pamela [name], [place] Pharmacist Assistant  [phone]

## 2021-08-23 ENCOUNTER — Telehealth: Payer: Self-pay

## 2021-08-23 ENCOUNTER — Telehealth: Payer: Self-pay | Admitting: Hematology and Oncology

## 2021-08-23 ENCOUNTER — Other Ambulatory Visit: Payer: Self-pay | Admitting: Family Medicine

## 2021-08-23 DIAGNOSIS — M21612 Bunion of left foot: Secondary | ICD-10-CM | POA: Diagnosis not present

## 2021-08-23 DIAGNOSIS — S92344G Nondisplaced fracture of fourth metatarsal bone, right foot, subsequent encounter for fracture with delayed healing: Secondary | ICD-10-CM | POA: Diagnosis not present

## 2021-08-23 DIAGNOSIS — M2040 Other hammer toe(s) (acquired), unspecified foot: Secondary | ICD-10-CM

## 2021-08-23 DIAGNOSIS — M2042 Other hammer toe(s) (acquired), left foot: Secondary | ICD-10-CM | POA: Diagnosis not present

## 2021-08-23 DIAGNOSIS — M21619 Bunion of unspecified foot: Secondary | ICD-10-CM

## 2021-08-23 DIAGNOSIS — M79671 Pain in right foot: Secondary | ICD-10-CM | POA: Diagnosis not present

## 2021-08-23 HISTORY — DX: Bunion of unspecified foot: M21.619

## 2021-08-23 HISTORY — DX: Other hammer toe(s) (acquired), unspecified foot: M20.40

## 2021-08-23 NOTE — Chronic Care Management (AMB) (Signed)
Chronic Care Management Pharmacy Assistant   Name: Pamela [name]  MRN: [id number] DOB: [birth year]  Reason for Encounter: Disease State call for HTN   Recent office visits:  None   Recent consult visits:  08/10/21 (Endocrinology) Rayetta Pigg NP. Seen for DM2 and CKD.Increased Methocarbamol from 50 mg to 750 mg 3 times daily. D/C Ondansetron HCI 4 mg.   08/03/21 (Oncology) Telephone Encounter. Note states Will have her continue the same Coumadin 6 mg T,Th,Sat and 5 mg rest of days.  07/28/21 (Cardiology) Berniece Salines DO. Seen for HTN. Started Isosorbide Mononitrate 30 mg daily. Increased furosemide from 20 mg to 40 mg daily.    07/05/21 (Oncology) Telephone Encounter.Note states INR today, which was 1.8. He recommends she return to her usual Coumadin dosing, which is Coumadin 6mg  on Tue, Thur and Sat; Coumadin 5mg  other days.   Hospital visits:  None   Medications: Outpatient Encounter Medications as of 08/23/2021  Medication Sig   albuterol (VENTOLIN HFA) 108 (90 Base) MCG/ACT inhaler Inhale 1-2 puffs into the lungs every 6 (six) hours as needed for wheezing or shortness of breath.   amLODipine (NORVASC) 5 MG tablet TAKE 1 TABLET(5 MG) BY MOUTH DAILY   ASPIRIN LOW DOSE 81 MG EC tablet TAKE 1 TABLET(81 MG) BY MOUTH DAILY   atorvastatin (LIPITOR) 40 MG tablet Take 1 tablet (40 mg total) by mouth daily at 12 noon. (Patient taking differently: Take 40 mg by mouth daily with lunch.)   azelastine (ASTELIN) 0.1 % nasal spray Place 2 sprays into both nostrils daily as needed for rhinitis or allergies.   B Complex-C (B-COMPLEX WITH VITAMIN C) tablet Take 1 tablet by mouth daily with lunch.   budesonide-formoterol (SYMBICORT) 80-4.5 MCG/ACT inhaler Inhale 2 puffs into the lungs 2 (two) times daily.   Cholecalciferol (VITAMIN D3) 50 MCG (2000 UT) TABS Take 2,000 Units by mouth daily with lunch.    Continuous Blood Gluc Receiver (FREESTYLE LIBRE 2 READER) DEVI 1 each by  Does not apply route 4 (four) times daily - after meals and at bedtime. E11.40   Continuous Blood Gluc Sensor (FREESTYLE LIBRE 2 SENSOR) MISC 2 each by Does not apply route every 14 (fourteen) days. 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 Uses twice weekly   ferrous sulfate 325 (65 FE) MG tablet Take 325 mg by mouth daily with lunch.   furosemide (LASIX) 40 MG tablet Take 1 tablet (40 mg total) by mouth daily.   HYDROcodone-acetaminophen (NORCO/VICODIN) 5-325 MG tablet Take 1 tablet by mouth every 4 (four) hours as needed for moderate pain.   Insulin Aspart FlexPen (NOVOLOG) 100 UNIT/ML Inject 4-10 Units into the skin 3 (three) times daily with meals.   insulin glargine, 1 Unit Dial, (TOUJEO) 300 UNIT/ML Solostar Pen Inject 15 Units into the skin at bedtime.   Insulin Pen Needle (BD PEN NEEDLE NANO 2ND GEN) 32G X 4 MM MISC USE DAILY AS DIRECTED   ipratropium-albuterol (DUONEB) 0.5-2.5 (3) MG/3ML SOLN Take 3 mLs by nebulization 4 (four) times daily as needed (wheezing/shortness of breath).   isosorbide mononitrate (IMDUR) 30 MG 24 hr tablet Take 1 tablet (30 mg total) by mouth daily.   lisinopril (ZESTRIL) 20 MG tablet TAKE 2 TABLETS(40 MG) BY MOUTH TWICE DAILY   magnesium oxide (MAG-OX) 400 (240 Mg) MG tablet TAKE 2 TABLETS(800 MG) BY MOUTH TWICE DAILY (Patient taking differently: Take 800 mg by mouth 2 (two)  times daily.)   Menthol-Methyl Salicylate (SALONPAS PAIN RELIEF PATCH) PTCH Apply 1 patch topically daily as needed (pain.).   methocarbamol (ROBAXIN) 750 MG tablet Take 750 mg by mouth 3 (three) times daily.   ondansetron (ZOFRAN-ODT) 8 MG disintegrating tablet Take 8 mg by mouth every 8 (eight) hours as needed for nausea or vomiting.    pantoprazole (PROTONIX) 40 MG tablet Take 1 tablet (40 mg total) by mouth every morning.   polyethylene glycol (MIRALAX / GLYCOLAX) 17 g packet Take 17 g by mouth as needed. Uses 1-2 times  weekly   pregabalin (LYRICA) 75 MG capsule pregabalin 75 mg capsule  TAKE 1 CAPSULE BY MOUTH TWICE DAILY (Patient not taking: Reported on 08/10/2021)   promethazine-dextromethorphan (PROMETHAZINE-DM) 6.25-15 MG/5ML syrup Take 5 mLs by mouth 4 (four) times daily as needed for cough.   senna-docusate (SENOKOT-S) 8.6-50 MG tablet Take 1 tablet by mouth at bedtime.   sodium chloride (OCEAN) 0.65 % SOLN nasal spray Place 1-2 sprays into both nostrils 4 (four) times daily as needed for congestion.   trimethoprim (TRIMPEX) 100 MG tablet Take 100 mg by mouth at bedtime.   Vibegron (GEMTESA PO) Take by mouth at bedtime.   warfarin (COUMADIN) 1 MG tablet TAKE AS DIRECTED. SEE ADMIN INSTRUCTIONS.   warfarin (COUMADIN) 5 MG tablet TAKE 1 TABLET BY MOUTH EVERY DAY (Patient taking differently: Take by mouth as directed.)   No facility-administered encounter medications on file as of 08/23/2021.    Recent Office Vitals: BP Readings from Last 3 Encounters:  08/10/21 109/65  07/28/21 (!) 142/72  06/29/21 122/60   Pulse Readings from Last 3 Encounters:  08/10/21 73  07/28/21 79  06/29/21 75    Wt Readings from Last 3 Encounters:  08/10/21 180 lb (81.6 kg)  07/28/21 180 lb 6.4 oz (81.8 kg)  06/29/21 189 lb (85.7 kg)     Kidney Function Lab Results  Component Value Date/Time   CREATININE 1.41 (H) 06/15/2021 09:33 AM   CREATININE 1.24 (H) 03/10/2021 11:11 AM   GFR 57.98 (L) 03/10/2020 04:46 PM   GFRNONAA 61 09/11/2020 11:23 AM   GFRAA 70 09/11/2020 11:23 AM    BMP Latest Ref Rng & Units 06/15/2021 03/10/2021 02/03/2021  Glucose 70 - 99 mg/dL 164(H) 42(L) -  BUN 8 - 27 mg/dL 38(H) 52(H) 43(A)  Creatinine 0.57 - 1.00 mg/dL 1.41(H) 1.24(H) 1.2(A)  BUN/Creat Ratio 12 - 28 27 42(H) -  Sodium 134 - 144 mmol/L 135 138 138  Potassium 3.5 - 5.2 mmol/L 4.9 4.2 4.3  Chloride 96 - 106 mmol/L 96 101 103  CO2 20 - 29 mmol/L 23 24 30(A)  Calcium 8.7 - 10.3 mg/dL 9.3 9.7 8.5(A)     Current  antihypertensive regimen:  Amlodipine 5 mg daily Isosorbide Mononitrate 30 mg daily Furosemide 40 mg daily Lisinopril 20 mg 2 tablets twice daily Patient verbally confirms she is taking the above medications as directed. Yes  How often are you checking your Blood Pressure? daily  she checks her blood pressure in the morning before taking her medication.  Current home BP readings: 08/17/21 187/84 08/24/21 188/83, 08/25/21 155/56 right arm and left arm was 163/77 Note: Pt stated her top numbers are never this high but she stated she has been very stressed out due to family issues and it has her worried and upset I have sent a high priority message to Dr. Alyse Low nurse and CMA to relay this information and advise.   Wrist or arm cuff:arm  Caffeine intake:None  Salt intake:No salt  OTC medications including pseudoephedrine or NSAIDs?Tylenol   Any readings above 180/120? No, pt denies any HP symptoms but does have weakness from other issues   What recent interventions/DTPs have been made by any provider to improve Blood Pressure control since last CPP Visit: Pt stated no recent changes.  Any recent hospitalizations or ED visits since last visit with CPP? No  What diet changes have been made to improve Blood Pressure Control?  Pt avoids salt in foods   What exercise is being done to improve your Blood Pressure Control?  Pt is constantly on the go and gets her exercise that way   Adherence Review: Is the patient currently on ACE/ARB medication? Yes Does the patient have >5 day gap between last estimated fill dates? CPP to review  Care Gaps: Last annual wellness visit?None noted   Star Rating Drugs:  Medication:  Last Fill: Day Supply Lisinopril  01/26/21 90ds    12/21/20 [place], [place] Pharmacist Assistant  [phone]

## 2021-08-23 NOTE — Telephone Encounter (Signed)
Received home PT/INR result from 08/21/2021.  INR was 3.3. Phoned patient and she states she skipped her Coumadin yesterday and will resume today, 6 mg T,Th,Sat and 5 mg rest of days and she will repeat INR 08/25/2021.

## 2021-08-25 ENCOUNTER — Other Ambulatory Visit: Payer: Self-pay | Admitting: Oncology

## 2021-08-31 ENCOUNTER — Telehealth: Payer: Self-pay

## 2021-08-31 NOTE — Telephone Encounter (Signed)
-----   Message from [name], MD sent at 08/26/2021  4:42 PM EST ----- Regarding: FW: BP readings Recommend increase amlodipine to 10 mg daily.  Follow up in 1 month for hypertension.    ----- Message ----- From: Oleta Mouse, CMA Sent: 08/25/2021   4:27 PM EST To: [name], MD Subject: FW: BP readings                                 ----- Message ----- From: Eulis Canner Sent: 08/25/2021   8:44 AM EST To: Effie Shy, RN, Oleta Mouse, CMA Subject: BP readings                                    Good morning,  I am reaching out on behalf of this pt. I just did an HTN assessment call on this pt today and her BP has been running high.The CPP is out and need to let [name] know. Pt stated she has been stressed out recently and has a lot of family issues. Please relay to [name] and reach out to pt if needed.  10/18/20 187/84 08/24/21 188/83, 08/25/21 155/56 right arm and left arm was 163/77  Thank you!  Elray Mcgregor, [place] Pharmacist Assistant  [phone]

## 2021-08-31 NOTE — Telephone Encounter (Signed)
Patient was notified to increase Amlodipine and recheck in 1 month.

## 2021-09-02 DIAGNOSIS — K5900 Constipation, unspecified: Secondary | ICD-10-CM | POA: Diagnosis not present

## 2021-09-02 DIAGNOSIS — M1990 Unspecified osteoarthritis, unspecified site: Secondary | ICD-10-CM | POA: Diagnosis not present

## 2021-09-02 DIAGNOSIS — Z794 Long term (current) use of insulin: Secondary | ICD-10-CM | POA: Diagnosis not present

## 2021-09-02 DIAGNOSIS — E1122 Type 2 diabetes mellitus with diabetic chronic kidney disease: Secondary | ICD-10-CM | POA: Diagnosis not present

## 2021-09-02 DIAGNOSIS — J449 Chronic obstructive pulmonary disease, unspecified: Secondary | ICD-10-CM | POA: Diagnosis not present

## 2021-09-02 DIAGNOSIS — I25119 Atherosclerotic heart disease of native coronary artery with unspecified angina pectoris: Secondary | ICD-10-CM | POA: Diagnosis not present

## 2021-09-02 DIAGNOSIS — N3946 Mixed incontinence: Secondary | ICD-10-CM | POA: Diagnosis not present

## 2021-09-02 DIAGNOSIS — N390 Urinary tract infection, site not specified: Secondary | ICD-10-CM | POA: Diagnosis not present

## 2021-09-02 DIAGNOSIS — K219 Gastro-esophageal reflux disease without esophagitis: Secondary | ICD-10-CM | POA: Diagnosis not present

## 2021-09-02 DIAGNOSIS — Z7951 Long term (current) use of inhaled steroids: Secondary | ICD-10-CM | POA: Diagnosis not present

## 2021-09-02 DIAGNOSIS — Z7722 Contact with and (suspected) exposure to environmental tobacco smoke (acute) (chronic): Secondary | ICD-10-CM | POA: Diagnosis not present

## 2021-09-02 DIAGNOSIS — Z7901 Long term (current) use of anticoagulants: Secondary | ICD-10-CM | POA: Diagnosis not present

## 2021-09-02 DIAGNOSIS — E261 Secondary hyperaldosteronism: Secondary | ICD-10-CM | POA: Diagnosis not present

## 2021-09-02 DIAGNOSIS — I509 Heart failure, unspecified: Secondary | ICD-10-CM | POA: Diagnosis not present

## 2021-09-02 DIAGNOSIS — E1140 Type 2 diabetes mellitus with diabetic neuropathy, unspecified: Secondary | ICD-10-CM | POA: Diagnosis not present

## 2021-09-02 DIAGNOSIS — E1143 Type 2 diabetes mellitus with diabetic autonomic (poly)neuropathy: Secondary | ICD-10-CM | POA: Diagnosis not present

## 2021-09-02 DIAGNOSIS — K3184 Gastroparesis: Secondary | ICD-10-CM | POA: Diagnosis not present

## 2021-09-02 DIAGNOSIS — E1165 Type 2 diabetes mellitus with hyperglycemia: Secondary | ICD-10-CM | POA: Diagnosis not present

## 2021-09-02 DIAGNOSIS — I110 Hypertensive heart disease with heart failure: Secondary | ICD-10-CM | POA: Diagnosis not present

## 2021-09-07 ENCOUNTER — Telehealth: Payer: Self-pay | Admitting: Hematology and Oncology

## 2021-09-07 NOTE — Telephone Encounter (Signed)
Spoke with the patient regarding home INR of 1.5 on January 2nd.  She denies missed doses.  She states Imdur was added last month to her medication regimen.  She denies changes in her diet.  She is currently taking 6 mg Tuesday, Thursday, Saturday and 5 mg the rest of the days.  I will have her switch to 5 mg on Wednesday and Sunday and 6 mg the rest of the days.  She will repeat an INR on January 9th..  The patient verbalized understanding.

## 2021-09-09 NOTE — Telephone Encounter (Signed)
Amlodipine increased per PCP, will defer to them

## 2021-09-10 ENCOUNTER — Encounter: Payer: Self-pay | Admitting: Physician Assistant

## 2021-09-10 ENCOUNTER — Ambulatory Visit (INDEPENDENT_AMBULATORY_CARE_PROVIDER_SITE_OTHER): Payer: Medicare PPO | Admitting: Physician Assistant

## 2021-09-10 VITALS — BP 118/60 | HR 84 | Temp 97.2°F | Wt 178.6 lb

## 2021-09-10 DIAGNOSIS — J45909 Unspecified asthma, uncomplicated: Secondary | ICD-10-CM

## 2021-09-10 DIAGNOSIS — J060 Acute laryngopharyngitis: Secondary | ICD-10-CM | POA: Diagnosis not present

## 2021-09-10 LAB — POCT INFLUENZA A/B
Influenza A, POC: NEGATIVE
Influenza B, POC: NEGATIVE

## 2021-09-10 LAB — POC COVID19 BINAXNOW: SARS Coronavirus 2 Ag: NEGATIVE

## 2021-09-10 MED ORDER — CIPROFLOXACIN HCL 250 MG PO TABS: 250.0000 mg | ORAL_TABLET | Freq: Two times a day (BID) | ORAL | 0 refills | Status: AC

## 2021-09-10 MED ORDER — ALBUTEROL SULFATE HFA 108 (90 BASE) MCG/ACT IN AERS: 1.0000 | INHALATION_SPRAY | Freq: Four times a day (QID) | RESPIRATORY_TRACT | 2 refills | Status: DC | PRN

## 2021-09-10 MED ORDER — PROMETHAZINE-DM 6.25-15 MG/5ML PO SYRP: 5.0000 mL | ORAL_SOLUTION | Freq: Four times a day (QID) | ORAL | 0 refills | Status: DC | PRN

## 2021-09-10 MED ORDER — BUDESONIDE-FORMOTEROL FUMARATE 80-4.5 MCG/ACT IN AERO: 2.0000 | INHALATION_SPRAY | Freq: Two times a day (BID) | RESPIRATORY_TRACT | 3 refills | Status: DC

## 2021-09-10 MED ORDER — AZELASTINE HCL 0.1 % NA SOLN: 2.0000 | Freq: Every day | NASAL | 6 refills | Status: DC | PRN

## 2021-09-10 NOTE — Progress Notes (Signed)
Acute Office Visit  Subjective:    Patient ID: Pamela [name], female    DOB: [birth year], 76 y.o.   MRN: [id number]  Chief Complaint  Patient presents with   Cough   Nasal Congestion    HPI: Patient is in today for complaints of sore throat, cough and congestion for the past several days - she states she needs refills of both her inhalers, her cough medication and her nasal spray She denies fever or malaise Cough has been slightly productive  Past Medical History:  Diagnosis Date   Anticoagulated on Coumadin    chronic--- managed by hematology/ oncology,   Asthma, mild intermittent    followed by pcp--- per pt last exacerbation winter 2021 w/ acute bronchitis   Bilateral leg cramps    Chronic constipation    CKD (chronic kidney disease), stage III ([place])    Closed bimalleolar fracture of left ankle 02/26/2020   DDD (degenerative disc disease), cervical    w/ spondylosis,  per pt last steroid injection 06/ 2022   DDD (degenerative disc disease), lumbosacral    DVT (deep venous thrombosis) ([place]) 07/30/2013   Dyspnea    occasionally   GERD (gastroesophageal reflux disease)    History of cardiac murmur as a child    History of DVT of lower extremity    left lower extremity in 1980s, fell when bowling   History of pulmonary embolus (PE) 1993   per pt left lung post op 2 wks cholecystectomy   History of rheumatic fever as a child    per last echo 12-31-2019 no valvular issues   History of TIA (transient ischemic attack)    2014 and 2018 or 2019,  per pt no residual   HTN (hypertension)    followed by pcp   Hyperlipidemia    IDA (iron deficiency anemia)    Macular degeneration of both eyes    Mild obstructive sleep apnea    per pt dx 2017 tried to uses cpap but intolerant   Mixed incontinence urge and stress    urologist--- dr Nila Nephew   OA (osteoarthritis) 07/06/2018   Osteoporosis    PAF (paroxysmal atrial fibrillation) ([place]) 10/01/2014   cardiologist--- dr  Godfrey Pick tobb;   cardiac cath 02-18-2013 normal coronaries arteries, ef 50%, cath done since echo showed ef 30-35%; nucleat stress study 04/ 2020 normal , normal echo 04/ 2021,  event monitor 09-14-2020 rare ST/AT variable block   PONV (postoperative nausea and vomiting)    Protein S deficiency ([place])    followed by hemotology/ oncology-- dr d. Bobby Rumpf ([facility]) dx 1980s;  prior DVT left lower leg 1980s and left lung PE 1993; chronic  coumadin since 1980s   PVC's (premature ventricular contractions)    followed by cardiology   S/P cardiac catheterization 02/2013   Normal coronaries; low normal EF at 50%   Solitary pulmonary nodule on lung CT 02/06/2019   First noted 01/13/2014 > no change as of 12/21/2018   Spondylolisthesis, lumbar region 08/09/2018   Stroke ([place])    TIA in 2018 or 2019   Transient ischemic attack 07/30/2013   Type 2 diabetes mellitus treated with insulin ([place])    endocrilogist--- whitney reardon NP     (03-10-2021  pt continuously checks blood sugar throughout the day w/ Libre, fasting sugar --- 69--200)   Wears glasses    Wears hearing aid in both ears     Past Surgical History:  Procedure Laterality Date   CARDIAC  CATHETERIZATION  02/18/2013   @ [facility]  by Dr Martinique;  normal coronaries w/ preserved LVF, ef 50%;   previous cath 2001 normal ef 65%   CARPAL TUNNEL RELEASE Bilateral 1994   CATARACT EXTRACTION W/ INTRAOCULAR LENS IMPLANT Bilateral 2017   CHOLECYSTECTOMY, LAPAROSCOPIC  1993   COLONOSCOPY     FINGER SURGERY Left 2018   thumb   FOOT TENDON SURGERY Right    early 2000s   HARDWARE REMOVAL Left 03/12/2021   Procedure: HARDWARE REMOVAL;  Surgeon: Nicholes Stairs, MD;  Location: [facility];  Service: Orthopedics;  Laterality: Left;  60 MINS   ORIF ANKLE FRACTURE Left 02/26/2020   Procedure: OPEN REDUCTION INTERNAL FIXATION (ORIF) ANKLE FRACTURE;  Surgeon: Nicholes Stairs, MD;  Location: [facility];  Service: Orthopedics;   Laterality: Left;  90 mins   TONSILLECTOMY  1950   TOTAL KNEE ARTHROPLASTY Left 03/2005   TOTAL VAGINAL HYSTERECTOMY  1988   per pt still has ovaries    Family History  Problem Relation Age of Onset   Cirrhosis Mother    Antithrombin III deficiency Mother        multiple emboli   Ulcerative colitis Mother    Coronary artery disease Father    Heart attack Father    Hypertension Father    Protein S deficiency Sister        prior DVT   Protein S deficiency Daughter    Hypertension Sister    Hypertension Brother    Lung cancer Brother    Heart attack Brother    Heart attack Sister        age 63    Stroke Neg Hx    Colitis Neg Hx    Colon polyps Neg Hx    Esophageal cancer Neg Hx    Liver cancer Neg Hx    Pancreatic cancer Neg Hx    Rectal cancer Neg Hx    Stomach cancer Neg Hx     Social History   Socioeconomic History   Marital status: Married    Spouse name: Pamela [name]   Number of children: Not on file   Years of education: Not on file   Highest education level: Not on file  Occupational History   Not on file  Tobacco Use   Smoking status: Never   Smokeless tobacco: Never  Vaping Use   Vaping Use: Never used  Substance and Sexual Activity   Alcohol use: No   Drug use: Never   Sexual activity: Not on file  Other Topics Concern   Not on file  Social History Narrative   Lives with spouse in [place].  2 grown daughters.   Retired Glass blower/designer     Social Determinants of Radio broadcast assistant Strain: Not on file  Food Insecurity: No Food Insecurity   Worried About Charity fundraiser in the Last Year: Never true   Arboriculturist in the Last Year: Never true  Transportation Needs: No Transportation Needs   Lack of Transportation (Medical): No   Lack of Transportation (Non-Medical): No  Physical Activity: Not on file  Stress: Not on file  Social Connections: Not on file  Intimate Partner Violence: Not on file    Outpatient Medications Prior to  Visit  Medication Sig Dispense Refill   amLODipine (NORVASC) 5 MG tablet TAKE 1 TABLET(5 MG) BY MOUTH DAILY (Patient taking differently: 10 mg.) 90 tablet 1   ASPIRIN LOW DOSE 81 MG EC tablet  TAKE 1 TABLET(81 MG) BY MOUTH DAILY 90 tablet 3   atorvastatin (LIPITOR) 40 MG tablet Take 1 tablet (40 mg total) by mouth daily at 12 noon. (Patient taking differently: Take 40 mg by mouth daily with lunch.) 90 tablet 1   azelastine (ASTELIN) 0.1 % nasal spray Place 2 sprays into both nostrils daily as needed for rhinitis or allergies. 30 mL 6   B Complex-C (B-COMPLEX WITH VITAMIN C) tablet Take 1 tablet by mouth daily with lunch.     Cholecalciferol (VITAMIN D3) 50 MCG (2000 UT) TABS Take 2,000 Units by mouth daily with lunch.      Continuous Blood Gluc Receiver (FREESTYLE LIBRE 2 READER) DEVI 1 each by Does not apply route 4 (four) times daily - after meals and at bedtime. E11.40 1 each 0   Continuous Blood Gluc Sensor (FREESTYLE LIBRE 2 SENSOR) MISC 2 each by Does not apply route every 14 (fourteen) days. E11.40 6 each 3   estradiol (ESTRACE) 0.1 MG/GM vaginal cream Place 1 Applicatorful vaginally 2 (two) times a week. Uses twice weekly     ferrous sulfate 325 (65 FE) MG tablet Take 325 mg by mouth daily with lunch.     furosemide (LASIX) 40 MG tablet Take 1 tablet (40 mg total) by mouth daily. 90 tablet 3   HYDROcodone-acetaminophen (NORCO/VICODIN) 5-325 MG tablet Take 1 tablet by mouth every 4 (four) hours as needed for moderate pain. 20 tablet 0   Insulin Aspart FlexPen (NOVOLOG) 100 UNIT/ML Inject 4-10 Units into the skin 3 (three) times daily with meals. 15 mL 3   insulin glargine, 1 Unit Dial, (TOUJEO) 300 UNIT/ML Solostar Pen Inject 15 Units into the skin at bedtime. 135 mL 1   Insulin Pen Needle (BD PEN NEEDLE NANO 2ND GEN) 32G X 4 MM MISC USE AS DIRECTED 100 each 3   ipratropium-albuterol (DUONEB) 0.5-2.5 (3) MG/3ML SOLN Take 3 mLs by nebulization 4 (four) times daily as needed (wheezing/shortness  of breath).  0   isosorbide mononitrate (IMDUR) 30 MG 24 hr tablet Take 1 tablet (30 mg total) by mouth daily. 90 tablet 3   lisinopril (ZESTRIL) 20 MG tablet TAKE 2 TABLETS(40 MG) BY MOUTH TWICE DAILY 180 tablet 1   magnesium oxide (MAG-OX) 400 (240 Mg) MG tablet TAKE 2 TABLETS(800 MG) BY MOUTH TWICE DAILY (Patient taking differently: Take 800 mg by mouth 2 (two) times daily.) 120 tablet 2   Menthol-Methyl Salicylate (SALONPAS PAIN RELIEF PATCH) PTCH Apply 1 patch topically daily as needed (pain.).     methocarbamol (ROBAXIN) 750 MG tablet Take 750 mg by mouth 3 (three) times daily.     ondansetron (ZOFRAN-ODT) 8 MG disintegrating tablet Take 8 mg by mouth every 8 (eight) hours as needed for nausea or vomiting.      pantoprazole (PROTONIX) 40 MG tablet Take 1 tablet (40 mg total) by mouth every morning. 90 tablet 3   polyethylene glycol (MIRALAX / GLYCOLAX) 17 g packet Take 17 g by mouth as needed. Uses 1-2 times weekly     pregabalin (LYRICA) 75 MG capsule      senna-docusate (SENOKOT-S) 8.6-50 MG tablet Take 1 tablet by mouth at bedtime.     sodium chloride (OCEAN) 0.65 % SOLN nasal spray Place 1-2 sprays into both nostrils 4 (four) times daily as needed for congestion.     trimethoprim (TRIMPEX) 100 MG tablet Take 100 mg by mouth at bedtime.     Vibegron (GEMTESA PO) Take by mouth at bedtime.  warfarin (COUMADIN) 1 MG tablet TAKE AS DIRECTED. SEE ADMIN INSTRUCTIONS. 120 tablet 2   warfarin (COUMADIN) 5 MG tablet TAKE 1 TABLET BY MOUTH EVERY DAY (Patient taking differently: Take by mouth as directed.) 90 tablet 1   albuterol (VENTOLIN HFA) 108 (90 Base) MCG/ACT inhaler Inhale 1-2 puffs into the lungs every 6 (six) hours as needed for wheezing or shortness of breath.     budesonide-formoterol (SYMBICORT) 80-4.5 MCG/ACT inhaler Inhale 2 puffs into the lungs 2 (two) times daily. 1 each 3   promethazine-dextromethorphan (PROMETHAZINE-DM) 6.25-15 MG/5ML syrup Take 5 mLs by mouth 4 (four) times  daily as needed for cough. 118 mL 0   No facility-administered medications prior to visit.    Allergies  Allergen Reactions   Ketek [Telithromycin] Nausea And Vomiting and Rash   Loxapine Succinate Hives   Naproxen Sodium Anaphylaxis and Hives   Amoxapine And Related Hives   Darvon Nausea And Vomiting   Belsomra [Suvorexant] Other (See Comments)    "Nightmares"   Cymbalta [Duloxetine Hcl] Other (See Comments)    "Blackouts"   Gabapentin Other (See Comments)    Blurry vision   Lyrica [Pregabalin]     Makes her sedated   Amoxicillin Rash   Propoxyphene Nausea And Vomiting    Review of Systems CONSTITUTIONAL: Negative for chills, fatigue, fever, unintentional weight gain and unintentional weight loss.  E/N/T: see HPI CARDIOVASCULAR: Negative for chest pain, dizziness, palpitations and pedal edema.  RESPIRATORY: see HPI GASTROINTESTINAL: Negative for abdominal pain, acid reflux symptoms, constipation, diarrhea, nausea and vomiting.      Objective:    Physical Exam PHYSICAL EXAM:   VS: BP 118/60 (BP Location: Left Arm, Patient Position: Sitting, Cuff Size: Normal)    Pulse 84    Temp (!) 97.2 F (36.2 C) (Temporal)    Wt 178 lb 9.6 oz (81 kg)    SpO2 96%    BMI 30.66 kg/m   GEN: Well nourished, well developed, in no acute distress  HEENT - pharynx with erythema/pnd --- TMS normal Cardiac: RRR; no murmurs,  Respiratory:  normal respiratory rate and pattern with no distress - normal breath sounds with no rales, rhonchi, wheezes or rubs Psych: euthymic mood, appropriate affect and demeanor  Office Visit on 09/10/2021  Component Date Value Ref Range Status   SARS Coronavirus 2 Ag 09/10/2021 Negative  Negative Final   Influenza A, POC 09/10/2021 Negative  Negative Final   Influenza B, POC 09/10/2021 Negative  Negative Final    BP 118/60 (BP Location: Left Arm, Patient Position: Sitting, Cuff Size: Normal)    Pulse 84    Temp (!) 97.2 F (36.2 C) (Temporal)    Wt 178 lb  9.6 oz (81 kg)    SpO2 96%    BMI 30.66 kg/m  Wt Readings from Last 3 Encounters:  09/10/21 178 lb 9.6 oz (81 kg)  08/10/21 180 lb (81.6 kg)  07/28/21 180 lb 6.4 oz (81.8 kg)    Health Maintenance Due  Topic Date Due   OPHTHALMOLOGY EXAM  Never done   Hepatitis C Screening  Never done   Zoster Vaccines- Shingrix (1 of 2) Never done   Pneumonia Vaccine 33+ Years old (3 - PPSV23 if available, else PCV20) 07/31/2015   TETANUS/TDAP  07/24/2021    There are no preventive care reminders to display for this patient.   Lab Results  Component Value Date   TSH 2.020 11/14/2019   Lab Results  Component Value Date   WBC  6.4 06/15/2021   HGB 13.1 06/15/2021   HCT 38.6 06/15/2021   MCV 91 06/15/2021   PLT 323 06/15/2021   Lab Results  Component Value Date   NA 135 06/15/2021   K 4.9 06/15/2021   CO2 23 06/15/2021   GLUCOSE 164 (H) 06/15/2021   BUN 38 (H) 06/15/2021   CREATININE 1.41 (H) 06/15/2021   BILITOT 0.3 06/15/2021   ALKPHOS 73 06/15/2021   AST 23 06/15/2021   ALT 27 06/15/2021   PROT 7.2 06/15/2021   ALBUMIN 4.4 06/15/2021   CALCIUM 9.3 06/15/2021   ANIONGAP 12 02/26/2020   EGFR 39 (L) 06/15/2021   GFR 57.98 (L) 03/10/2020   Lab Results  Component Value Date   CHOL 148 06/15/2021   Lab Results  Component Value Date   HDL 69 06/15/2021   Lab Results  Component Value Date   LDLCALC 66 06/15/2021   Lab Results  Component Value Date   TRIG 65 06/15/2021   Lab Results  Component Value Date   CHOLHDL 2.1 06/15/2021   Lab Results  Component Value Date   HGBA1C 7.5 (H) 06/15/2021       Assessment & Plan:   Problem List Items Addressed This Visit       Respiratory   Asthma   Relevant Medications   albuterol (VENTOLIN HFA) 108 (90 Base) MCG/ACT inhaler   budesonide-formoterol (SYMBICORT) 80-4.5 MCG/ACT inhaler   Other Visit Diagnoses     Acute laryngopharyngitis    -  Primary   Relevant Medications   albuterol (VENTOLIN HFA) 108 (90 Base)  MCG/ACT inhaler   ciprofloxacin (CIPRO) 250 MG tablet   Other Relevant Orders   POC COVID-19 BinaxNow (Completed)   POCT Influenza A/B (Completed)   Sinus congestion       Relevant Medications   promethazine-dextromethorphan (PROMETHAZINE-DM) 6.25-15 MG/5ML syrup   budesonide-formoterol (SYMBICORT) 80-4.5 MCG/ACT inhaler   Acute cough       Relevant Medications   promethazine-dextromethorphan (PROMETHAZINE-DM) 6.25-15 MG/5ML syrup   Dyspnea on exertion       Relevant Medications   budesonide-formoterol (SYMBICORT) 80-4.5 MCG/ACT inhaler      Meds ordered this encounter  Medications   albuterol (VENTOLIN HFA) 108 (90 Base) MCG/ACT inhaler    Sig: Inhale 1-2 puffs into the lungs every 6 (six) hours as needed for wheezing or shortness of breath.    Dispense:  1 each    Refill:  2    Order Specific Question:   Supervising Provider    Answer:   [name]   promethazine-dextromethorphan (PROMETHAZINE-DM) 6.25-15 MG/5ML syrup    Sig: Take 5 mLs by mouth 4 (four) times daily as needed for cough.    Dispense:  118 mL    Refill:  0    Order Specific Question:   Supervising Provider    Answer:   [name]   budesonide-formoterol (SYMBICORT) 80-4.5 MCG/ACT inhaler    Sig: Inhale 2 puffs into the lungs 2 (two) times daily.    Dispense:  1 each    Refill:  3    Order Specific Question:   Supervising Provider    Answer:   [name]   ciprofloxacin (CIPRO) 250 MG tablet    Sig: Take 1 tablet (250 mg total) by mouth 2 (two) times daily for 10 days.    Dispense:  20 tablet    Refill:  0    Order Specific Question:   Supervising Provider    Answer:  COX, KIRSTEN [id number]    Orders Placed This Encounter  Procedures   POC COVID-19 BinaxNow   POCT Influenza A/B     Follow-up: Return if symptoms worsen or fail to improve.  An After Visit Summary was printed and given to the patient.  Yetta Flock [facility] [phone]

## 2021-09-13 ENCOUNTER — Telehealth: Payer: Self-pay | Admitting: Hematology and Oncology

## 2021-09-13 NOTE — Telephone Encounter (Signed)
Spoke with patient re. home INR of 2.3 today. Will continue coumadin 5 mg Wed and Sun and 6 mg rest of days and plan to repeat home INR in 1 week. The patient verbalized understanding.

## 2021-09-15 DIAGNOSIS — M5412 Radiculopathy, cervical region: Secondary | ICD-10-CM | POA: Diagnosis not present

## 2021-09-15 DIAGNOSIS — M545 Low back pain, unspecified: Secondary | ICD-10-CM | POA: Diagnosis not present

## 2021-09-15 DIAGNOSIS — M542 Cervicalgia: Secondary | ICD-10-CM | POA: Diagnosis not present

## 2021-09-16 ENCOUNTER — Other Ambulatory Visit: Payer: Self-pay | Admitting: Legal Medicine

## 2021-09-16 DIAGNOSIS — I10 Essential (primary) hypertension: Secondary | ICD-10-CM

## 2021-09-22 ENCOUNTER — Telehealth: Payer: Self-pay

## 2021-09-22 NOTE — Telephone Encounter (Signed)
Pt called to notify [facility] of her INR results... 2.8 on 09/21/2021.

## 2021-09-22 NOTE — Telephone Encounter (Signed)
INR therapeutic, will continue same warfarin 5 mg on Wed and Sun, 6 mg rest of days. Will repeat home INR on 10/04/21.  The patient verbalized understanding.

## 2021-10-15 DIAGNOSIS — M5412 Radiculopathy, cervical region: Secondary | ICD-10-CM | POA: Diagnosis not present

## 2021-10-17 NOTE — Progress Notes (Signed)
Subjective:  Patient ID: Pamela [name], female    DOB: [birth year]  Age: 76 y.o. MRN: [id number]  Chief Complaint  Patient presents with   Diabetes   Hyperlipidemia   Hypertension    Diabetes:  Complications: CKD 3b.  Glucose checking: before meals and before bed. Glucose logs: Range 69-335.  High sugars are in the evening.  Hypoglycemia: yes Most recent A1C: 7.5 Current medications: Novolog Pen injector 4-10 units three times day with meals, Toujeo 15 units at bed time. SSI 90-150 4 U 151-250 +1 251-300 +2 301-350 +3 351- 400 +4 >400 +5 Last Eye Exam: unsure when last one was.  Foot checks: daily  Hyperlipidemia: Current medications: Atorvastatin 36m 1 tablet once daily.  Hypertension: Complications: CKD 3a. Current medications: Amlodipine 569m1 tablet once daily. Lisinopril 20 mg 2 twice a day. Imdur 30 mg once daily, furosemide lasix 40 mg daily  Urge incontinence: [employer].   Longstanding persistent ATRIAL FIBRILLATION/Protein S deficiency: on coumadin (also has protein s deficiency.) Coumadin managed by hematology. Current medications: Coumadin   Diet: healthy Exercise: a little, but hurts too much    Complaining of constant headache, muscle pain, joint pain, and severe fatigue. Whole body feels weak. Left hand starts shaking with normal use. Patient is left hand dominant. Right hand cramps and draws. Stiffness in am.  Symptoms have been going on for at least 3 months. Gradually worsening. Headache is in back of neck into head and right side of face/jaw will hurt really bad about 1-2 times per week. Blurry vision in both eyes. Patient has macular degeneration. Patient is anticipating eye injections starting this Thursday. Patient has taken tylenol and hydrocodone. Unable to take nsaids due to coumadin. On robaxin 750 mg twice daily (sometimes helps. ) Unable to take three times a day due to making her feel spacy.  Also complaining of memory  loss/confusion.  Dr. RaNelva BushPA saw her a couple of weeks ago. 09/15/2021. 12/15 BL LD nerve blocks. Helped for about a week. Suggested radiofrequency ablation for lumbar facet joints. She referred her to Dr. ElEllene RouteDr. ElEllene Routeaw her and he thinks she may have Fibromyalgia.  She feels like she can not get things done. She is very discouraged.    CIC: on miralax and sennakot.  Recurrent UTI: on trimpex 100 mg daily  Current Outpatient Medications on File Prior to Visit  Medication Sig Dispense Refill   albuterol (VENTOLIN HFA) 108 (90 Base) MCG/ACT inhaler Inhale 1-2 puffs into the lungs every 6 (six) hours as needed for wheezing or shortness of breath. 1 each 2   amLODipine (NORVASC) 5 MG tablet TAKE 1 TABLET(5 MG) BY MOUTH DAILY (Patient taking differently: 10 mg.) 90 tablet 1   ASPIRIN LOW DOSE 81 MG EC tablet TAKE 1 TABLET(81 MG) BY MOUTH DAILY 90 tablet 3   atorvastatin (LIPITOR) 40 MG tablet Take 1 tablet (40 mg total) by mouth daily at 12 noon. (Patient taking differently: Take 40 mg by mouth daily with lunch.) 90 tablet 1   azelastine (ASTELIN) 0.1 % nasal spray Place 2 sprays into both nostrils daily as needed for rhinitis or allergies. 30 mL 6   B Complex-C (B-COMPLEX WITH VITAMIN C) tablet Take 1 tablet by mouth daily with lunch.     Cholecalciferol (VITAMIN D3) 50 MCG (2000 UT) TABS Take 2,000 Units by mouth daily with lunch.      Continuous Blood Gluc Receiver (FREESTYLE LIBRE 2 READER) DEVI 1 each by Does  not apply route 4 (four) times daily - after meals and at bedtime. E11.40 1 each 0   Continuous Blood Gluc Sensor (FREESTYLE LIBRE 2 SENSOR) MISC 2 each by Does not apply route every 14 (fourteen) days. E11.40 6 each 3   estradiol (ESTRACE) 0.1 MG/GM vaginal cream Place 1 Applicatorful vaginally 2 (two) times a week. Uses twice weekly     ferrous sulfate 325 (65 FE) MG tablet Take 325 mg by mouth daily with lunch.     furosemide (LASIX) 40 MG tablet Take 1 tablet (40 mg total) by  mouth daily. 90 tablet 3   HYDROcodone-acetaminophen (NORCO/VICODIN) 5-325 MG tablet Take 1 tablet by mouth every 4 (four) hours as needed for moderate pain. 20 tablet 0   insulin glargine, 1 Unit Dial, (TOUJEO) 300 UNIT/ML Solostar Pen Inject 15 Units into the skin at bedtime. 135 mL 1   Insulin Pen Needle (BD PEN NEEDLE NANO 2ND GEN) 32G X 4 MM MISC USE AS DIRECTED 100 each 3   ipratropium-albuterol (DUONEB) 0.5-2.5 (3) MG/3ML SOLN Take 3 mLs by nebulization 4 (four) times daily as needed (wheezing/shortness of breath).  0   isosorbide mononitrate (IMDUR) 30 MG 24 hr tablet Take 1 tablet (30 mg total) by mouth daily. 90 tablet 3   lisinopril (ZESTRIL) 20 MG tablet TAKE 2 TABLETS(40 MG) BY MOUTH TWICE DAILY 180 tablet 1   magnesium oxide (MAG-OX) 400 (240 Mg) MG tablet TAKE 2 TABLETS(800 MG) BY MOUTH TWICE DAILY (Patient taking differently: Take 800 mg by mouth 2 (two) times daily.) 120 tablet 2   Menthol-Methyl Salicylate (SALONPAS PAIN RELIEF PATCH) PTCH Apply 1 patch topically daily as needed (pain.).     methocarbamol (ROBAXIN) 750 MG tablet Take 750 mg by mouth 3 (three) times daily.     ondansetron (ZOFRAN-ODT) 8 MG disintegrating tablet Take 8 mg by mouth every 8 (eight) hours as needed for nausea or vomiting.      pantoprazole (PROTONIX) 40 MG tablet Take 1 tablet (40 mg total) by mouth every morning. 90 tablet 3   polyethylene glycol (MIRALAX / GLYCOLAX) 17 g packet Take 17 g by mouth as needed. Uses 1-2 times weekly     promethazine-dextromethorphan (PROMETHAZINE-DM) 6.25-15 MG/5ML syrup Take 5 mLs by mouth 4 (four) times daily as needed for cough. 118 mL 0   senna-docusate (SENOKOT-S) 8.6-50 MG tablet Take 1 tablet by mouth at bedtime.     sodium chloride (OCEAN) 0.65 % SOLN nasal spray Place 1-2 sprays into both nostrils 4 (four) times daily as needed for congestion.     trimethoprim (TRIMPEX) 100 MG tablet Take 100 mg by mouth at bedtime.     Vibegron (GEMTESA PO) Take by mouth at  bedtime.     warfarin (COUMADIN) 1 MG tablet TAKE AS DIRECTED. SEE ADMIN INSTRUCTIONS. 120 tablet 2   warfarin (COUMADIN) 5 MG tablet TAKE 1 TABLET BY MOUTH EVERY DAY (Patient taking differently: Take by mouth as directed.) 90 tablet 1   No current facility-administered medications on file prior to visit.   Past Medical History:  Diagnosis Date   Anticoagulated on Coumadin    chronic--- managed by hematology/ oncology,   Asthma, mild intermittent    followed by pcp--- per pt last exacerbation winter 2021 w/ acute bronchitis   Bilateral leg cramps    Chronic constipation    CKD (chronic kidney disease), stage III ([place])    Closed bimalleolar fracture of left ankle 02/26/2020   DDD (degenerative disc disease), cervical  w/ spondylosis,  per pt last steroid injection 06/ 2022   DDD (degenerative disc disease), lumbosacral    DVT (deep venous thrombosis) ([place]) 07/30/2013   Dyspnea    occasionally   GERD (gastroesophageal reflux disease)    History of cardiac murmur as a child    History of DVT of lower extremity    left lower extremity in 1980s, fell when bowling   History of pulmonary embolus (PE) 1993   per pt left lung post op 2 wks cholecystectomy   History of rheumatic fever as a child    per last echo 12-31-2019 no valvular issues   History of TIA (transient ischemic attack)    2014 and 2018 or 2019,  per pt no residual   HTN (hypertension)    followed by pcp   Hyperlipidemia    IDA (iron deficiency anemia)    Macular degeneration of both eyes    Mild obstructive sleep apnea    per pt dx 2017 tried to uses cpap but intolerant   Mixed incontinence urge and stress    urologist--- dr Nila Nephew   OA (osteoarthritis) 07/06/2018   Osteoporosis    PAF (paroxysmal atrial fibrillation) ([place]) 10/01/2014   cardiologist--- dr Godfrey Pick tobb;   cardiac cath 02-18-2013 normal coronaries arteries, ef 50%, cath done since echo showed ef 30-35%; nucleat stress study 04/ 2020 normal , normal echo  04/ 2021,  event monitor 09-14-2020 rare ST/AT variable block   PONV (postoperative nausea and vomiting)    Protein S deficiency ([place])    followed by hemotology/ oncology-- dr d. Bobby Rumpf ([facility]) dx 1980s;  prior DVT left lower leg 1980s and left lung PE 1993; chronic  coumadin since 1980s   PVC's (premature ventricular contractions)    followed by cardiology   S/P cardiac catheterization 02/2013   Normal coronaries; low normal EF at 50%   Solitary pulmonary nodule on lung CT 02/06/2019   First noted 01/13/2014 > no change as of 12/21/2018   Spondylolisthesis, lumbar region 08/09/2018   Stroke ([place])    TIA in 2018 or 2019   Transient ischemic attack 07/30/2013   Type 2 diabetes mellitus treated with insulin ([place])    endocrilogist--- whitney reardon NP     (03-10-2021  pt continuously checks blood sugar throughout the day w/ Libre, fasting sugar --- 69--200)   Wears glasses    Wears hearing aid in both ears    Past Surgical History:  Procedure Laterality Date   CARDIAC CATHETERIZATION  02/18/2013   @ [facility]  by Dr Martinique;  normal coronaries w/ preserved LVF, ef 50%;   previous cath 2001 normal ef 65%   CARPAL TUNNEL RELEASE Bilateral 1994   CATARACT EXTRACTION W/ INTRAOCULAR LENS IMPLANT Bilateral 2017   CHOLECYSTECTOMY, LAPAROSCOPIC  1993   COLONOSCOPY     FINGER SURGERY Left 2018   thumb   FOOT TENDON SURGERY Right    early 2000s   HARDWARE REMOVAL Left 03/12/2021   Procedure: HARDWARE REMOVAL;  Surgeon: Nicholes Stairs, MD;  Location: [facility];  Service: Orthopedics;  Laterality: Left;  60 MINS   ORIF ANKLE FRACTURE Left 02/26/2020   Procedure: OPEN REDUCTION INTERNAL FIXATION (ORIF) ANKLE FRACTURE;  Surgeon: Nicholes Stairs, MD;  Location: [facility];  Service: Orthopedics;  Laterality: Left;  90 mins   TONSILLECTOMY  1950   TOTAL KNEE ARTHROPLASTY Left 03/2005   TOTAL VAGINAL HYSTERECTOMY  1988   per pt still has ovaries  Family History   Problem Relation Age of Onset   Cirrhosis Mother    Antithrombin III deficiency Mother        multiple emboli   Ulcerative colitis Mother    Coronary artery disease Father    Heart attack Father    Hypertension Father    Protein S deficiency Sister        prior DVT   Protein S deficiency Daughter    Hypertension Sister    Hypertension Brother    Lung cancer Brother    Heart attack Brother    Heart attack Sister        age 60    Stroke Neg Hx    Colitis Neg Hx    Colon polyps Neg Hx    Esophageal cancer Neg Hx    Liver cancer Neg Hx    Pancreatic cancer Neg Hx    Rectal cancer Neg Hx    Stomach cancer Neg Hx    Social History   Socioeconomic History   Marital status: Married    Spouse name: [name]   Number of children: Not on file   Years of education: Not on file   Highest education level: Not on file  Occupational History   Not on file  Tobacco Use   Smoking status: Never   Smokeless tobacco: Never  Vaping Use   Vaping Use: Never used  Substance and Sexual Activity   Alcohol use: No   Drug use: Never   Sexual activity: Not on file  Other Topics Concern   Not on file  Social History Narrative   Lives with spouse in [place].  2 grown daughters.   Retired Glass blower/designer     Social Determinants of Radio broadcast assistant Strain: Not on file  Food Insecurity: Not on file  Transportation Needs: Not on file  Physical Activity: Not on file  Stress: Not on file  Social Connections: Not on file    Review of Systems  Constitutional:  Positive for fatigue. Negative for appetite change, chills and fever.  HENT:  Negative for congestion, ear pain, sinus pressure and sore throat.   Eyes:  Negative for pain.  Respiratory:  Negative for cough, chest tightness, shortness of breath and wheezing.   Cardiovascular:  Positive for chest pain (Imdur helping). Negative for palpitations.  Gastrointestinal:  Positive for constipation and diarrhea. Negative for  abdominal pain, nausea and vomiting.  Endocrine: Positive for polydipsia and polyuria. Negative for polyphagia.  Genitourinary:  Negative for dysuria and hematuria.  Musculoskeletal:  Positive for arthralgias, back pain, myalgias and neck pain. Negative for joint swelling.       Diffuse joint pain   Skin:  Negative for rash.  Neurological:  Positive for dizziness (if stands up to fast), tremors (left hand), weakness and headaches.  Hematological:  Bruises/bleeds easily.  Psychiatric/Behavioral:  Positive for confusion. Negative for dysphoric mood. The patient is not nervous/anxious.     Objective:  BP 128/70    Pulse 72    Temp (!) 96.1 F (35.6 C)    Resp 16    Ht '5\' 2"'  (1.575 m)    Wt 179 lb (81.2 kg)    BMI 32.74 kg/m   BP/Weight 10/18/2021 09/10/2021 63/03/8587  Systolic BP 502 774 128  Diastolic BP 70 60 65  Wt. (Lbs) 179 178.6 180  BMI 32.74 30.66 30.9    Physical Exam Vitals reviewed.  Constitutional:      Appearance: Normal appearance. She is  normal weight.  Neck:     Vascular: No carotid bruit.  Cardiovascular:     Rate and Rhythm: Normal rate and regular rhythm.     Pulses: Normal pulses.     Heart sounds: Normal heart sounds.  Pulmonary:     Effort: Pulmonary effort is normal. No respiratory distress.     Breath sounds: Normal breath sounds.  Abdominal:     General: Abdomen is flat. Bowel sounds are normal.     Palpations: Abdomen is soft.     Tenderness: There is no abdominal tenderness.  Musculoskeletal:        General: Tenderness (swelling in hands. No positive FM trigger points.) present.  Neurological:     Mental Status: She is alert and oriented to person, place, and time.  Psychiatric:        Mood and Affect: Mood normal.        Behavior: Behavior normal.    Diabetic Foot Exam - Simple   No data filed      Lab Results  Component Value Date   WBC 8.5 10/18/2021   HGB 13.0 10/18/2021   HCT 38.7 10/18/2021   PLT 289 10/18/2021   GLUCOSE 79  10/18/2021   CHOL 144 10/18/2021   TRIG 64 10/18/2021   HDL 72 10/18/2021   LDLCALC 59 10/18/2021   ALT 18 10/18/2021   AST 24 10/18/2021   NA 138 10/18/2021   K 3.9 10/18/2021   CL 97 10/18/2021   CREATININE 1.34 (H) 10/18/2021   BUN 26 10/18/2021   CO2 27 10/18/2021   TSH 2.510 10/18/2021   INR 2.0 (A) 06/08/2021   HGBA1C 7.4 (H) 10/18/2021   MICROALBUR 10 06/15/2021      Assessment & Plan:   Problem List Items Addressed This Visit       Cardiovascular and Mediastinum   Atrial fibrillation [I48.91]    The current medical regimen is effective;  continue present plan and medications.         Endocrine   Type 2 diabetes mellitus with diabetic neuropathy, with long-term current use of insulin (HCC) - Primary (Chronic)    Control: fairly good.  Recommend check sugars fasting daily. Recommend check feet daily. Recommend annual eye exams. Medicines: Toujeo 15 units at bed time. Novolog Pen injector 4-10 units three times day with meals  SSI 90-150 4 U 151-250 +1 251-300 +2 301-350 +3 351- 400 +4 >400 +5Continue to work on eating a healthy diet and exercise.  Labs drawn today.         Relevant Orders   Hemoglobin A1c (Completed)   Microalbumin/Creatinine Ratio, Urine (Completed)     Musculoskeletal and Integument   Osteopenia    Recommend calcium with D.         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  CKD stage 3b stable.  The current medical regimen is effective;  continue present plan and medications.       Relevant Orders   Comprehensive metabolic panel (Completed)     Hematopoietic and Hemostatic   Protein S deficiency ([place])    Management per specialist. Hematology.  The current medical regimen is effective;  continue present plan and medications.       Acquired thrombophilia ([place])    Due to coumadin which is necessary due to atrial fibrillation and protein S  deficiency.         Other   Mixed  hyperlipidemia    Well controlled.  No changes to medicines.  Continue to work on eating a healthy diet and exercise.  Labs drawn today.        Relevant Orders   Lipid panel (Completed)   Joint pain    Check labs. Concerning for RA.       Relevant Orders   Rheumatoid factor (Completed)   CYCLIC CITRUL PEPTIDE ANTIBODY, IGG/IGA (Completed)   C-reactive protein (Completed)   ANA w/Reflex (Completed)   TSH (Completed)   Muscle pain    Rule out PMR.      Relevant Orders   Phosphorus (Completed)   Magnesium (Completed)   CK (Completed)   Other fatigue    Check labs.  Rule out PMR.       Relevant Orders   CBC with Differential/Platelet (Completed)   Memory loss    Check labs.  Consider mri of brain.       Relevant Orders   B12 and Folate Panel   Methylmalonic acid, serum   Chronic nonintractable headache    Check esr stat (came back normal.)      Relevant Orders   Sedimentation rate (Completed)  . Total time spent on today's visit was greater than 40 minutes, including both face-to-face time and nonface-to-face time personally spent on review of chart (labs and imaging), discussing labs and goals, discussing further work-up, treatment options, referrals to specialist if needed, reviewing outside records of pertinent, answering patient's questions, and coordinating care.   Follow-up: Return in about 4 weeks (around 11/15/2021) for chronic follow up.  An After Visit Summary was printed and given to the patient.  [name], MD [facility] [phone]

## 2021-10-18 ENCOUNTER — Encounter: Payer: Self-pay | Admitting: Family Medicine

## 2021-10-18 ENCOUNTER — Other Ambulatory Visit: Payer: Self-pay

## 2021-10-18 ENCOUNTER — Other Ambulatory Visit: Payer: Self-pay | Admitting: Nurse Practitioner

## 2021-10-18 ENCOUNTER — Ambulatory Visit (INDEPENDENT_AMBULATORY_CARE_PROVIDER_SITE_OTHER): Payer: Medicare PPO | Admitting: Family Medicine

## 2021-10-18 VITALS — BP 128/70 | HR 72 | Temp 96.1°F | Resp 16 | Ht 62.0 in | Wt 179.0 lb

## 2021-10-18 DIAGNOSIS — E1140 Type 2 diabetes mellitus with diabetic neuropathy, unspecified: Secondary | ICD-10-CM

## 2021-10-18 DIAGNOSIS — M8589 Other specified disorders of bone density and structure, multiple sites: Secondary | ICD-10-CM

## 2021-10-18 DIAGNOSIS — G8929 Other chronic pain: Secondary | ICD-10-CM

## 2021-10-18 DIAGNOSIS — I129 Hypertensive chronic kidney disease with stage 1 through stage 4 chronic kidney disease, or unspecified chronic kidney disease: Secondary | ICD-10-CM

## 2021-10-18 DIAGNOSIS — M2559 Pain in other specified joint: Secondary | ICD-10-CM

## 2021-10-18 DIAGNOSIS — M791 Myalgia, unspecified site: Secondary | ICD-10-CM

## 2021-10-18 DIAGNOSIS — D6869 Other thrombophilia: Secondary | ICD-10-CM

## 2021-10-18 DIAGNOSIS — N1831 Chronic kidney disease, stage 3a: Secondary | ICD-10-CM | POA: Diagnosis not present

## 2021-10-18 DIAGNOSIS — I4811 Longstanding persistent atrial fibrillation: Secondary | ICD-10-CM | POA: Diagnosis not present

## 2021-10-18 DIAGNOSIS — R519 Headache, unspecified: Secondary | ICD-10-CM | POA: Diagnosis not present

## 2021-10-18 DIAGNOSIS — Z794 Long term (current) use of insulin: Secondary | ICD-10-CM

## 2021-10-18 DIAGNOSIS — D6859 Other primary thrombophilia: Secondary | ICD-10-CM

## 2021-10-18 DIAGNOSIS — M810 Age-related osteoporosis without current pathological fracture: Secondary | ICD-10-CM

## 2021-10-18 DIAGNOSIS — E782 Mixed hyperlipidemia: Secondary | ICD-10-CM

## 2021-10-18 DIAGNOSIS — R5383 Other fatigue: Secondary | ICD-10-CM

## 2021-10-18 DIAGNOSIS — R413 Other amnesia: Secondary | ICD-10-CM

## 2021-10-18 LAB — SEDIMENTATION RATE: Sed Rate: 25 mm/hr (ref 0–40)

## 2021-10-19 LAB — HEMOGLOBIN A1C
Est. average glucose Bld gHb Est-mCnc: 166 mg/dL
Hgb A1c MFr Bld: 7.4 % — ABNORMAL HIGH (ref 4.8–5.6)

## 2021-10-19 LAB — MICROALBUMIN / CREATININE URINE RATIO
Creatinine, Urine: 132.2 mg/dL
Microalb/Creat Ratio: 16 mg/g creat (ref 0–29)
Microalbumin, Urine: 20.7 ug/mL

## 2021-10-19 LAB — RHEUMATOID FACTOR: Rheumatoid fact SerPl-aCnc: 40 IU/mL — ABNORMAL HIGH (ref <14.0)

## 2021-10-19 LAB — CBC WITH DIFFERENTIAL/PLATELET
Basophils Absolute: 0.1 10*3/uL (ref 0.0–0.2)
Basos: 1 %
EOS (ABSOLUTE): 0.2 10*3/uL (ref 0.0–0.4)
Eos: 2 %
Hematocrit: 38.7 % (ref 34.0–46.6)
Hemoglobin: 13 g/dL (ref 11.1–15.9)
Immature Grans (Abs): 0 10*3/uL (ref 0.0–0.1)
Immature Granulocytes: 0 %
Lymphocytes Absolute: 1.8 10*3/uL (ref 0.7–3.1)
Lymphs: 22 %
MCH: 30.5 pg (ref 26.6–33.0)
MCHC: 33.6 g/dL (ref 31.5–35.7)
MCV: 91 fL (ref 79–97)
Monocytes Absolute: 0.7 10*3/uL (ref 0.1–0.9)
Monocytes: 9 %
Neutrophils Absolute: 5.7 10*3/uL (ref 1.4–7.0)
Neutrophils: 66 %
Platelets: 289 10*3/uL (ref 150–450)
RBC: 4.26 x10E6/uL (ref 3.77–5.28)
RDW: 12.3 % (ref 11.7–15.4)
WBC: 8.5 10*3/uL (ref 3.4–10.8)

## 2021-10-19 LAB — LIPID PANEL
Chol/HDL Ratio: 2 ratio (ref 0.0–4.4)
Cholesterol, Total: 144 mg/dL (ref 100–199)
HDL: 72 mg/dL (ref >39)
LDL Chol Calc (NIH): 59 mg/dL (ref 0–99)
Triglycerides: 64 mg/dL (ref 0–149)
VLDL Cholesterol Cal: 13 mg/dL (ref 5–40)

## 2021-10-19 LAB — COMPREHENSIVE METABOLIC PANEL
ALT: 18 IU/L (ref 0–32)
AST: 24 IU/L (ref 0–40)
Albumin/Globulin Ratio: 1.5 (ref 1.2–2.2)
Albumin: 4.1 g/dL (ref 3.7–4.7)
Alkaline Phosphatase: 82 IU/L (ref 44–121)
BUN/Creatinine Ratio: 19 (ref 12–28)
BUN: 26 mg/dL (ref 8–27)
Bilirubin Total: 0.4 mg/dL (ref 0.0–1.2)
CO2: 27 mmol/L (ref 20–29)
Calcium: 9.5 mg/dL (ref 8.7–10.3)
Chloride: 97 mmol/L (ref 96–106)
Creatinine, Ser: 1.34 mg/dL — ABNORMAL HIGH (ref 0.57–1.00)
Globulin, Total: 2.7 g/dL (ref 1.5–4.5)
Glucose: 79 mg/dL (ref 70–99)
Potassium: 3.9 mmol/L (ref 3.5–5.2)
Sodium: 138 mmol/L (ref 134–144)
Total Protein: 6.8 g/dL (ref 6.0–8.5)
eGFR: 41 mL/min/{1.73_m2} — ABNORMAL LOW (ref >59)

## 2021-10-19 LAB — CK: Total CK: 61 U/L (ref 32–182)

## 2021-10-19 LAB — C-REACTIVE PROTEIN: CRP: <1 mg/L (ref 0–10)

## 2021-10-19 LAB — MAGNESIUM: Magnesium: 1.7 mg/dL (ref 1.6–2.3)

## 2021-10-19 LAB — CYCLIC CITRUL PEPTIDE ANTIBODY, IGG/IGA: Cyclic Citrullin Peptide Ab: 4 units (ref 0–19)

## 2021-10-19 LAB — PHOSPHORUS: Phosphorus: 4 mg/dL (ref 3.0–4.3)

## 2021-10-19 LAB — ANA W/REFLEX: Anti Nuclear Antibody (ANA): NEGATIVE

## 2021-10-19 LAB — TSH: TSH: 2.51 u[IU]/mL (ref 0.450–4.500)

## 2021-10-20 ENCOUNTER — Telehealth: Payer: Self-pay

## 2021-10-20 NOTE — Chronic Care Management (AMB) (Signed)
Chronic Care Management Pharmacy Assistant   Name: Roxane Puerto  MRN: [id number] DOB: Dec 25, 1945  Reason for Encounter: Prolia prior authorization   10/20/2021- Patient Prolia is covered at 100%, Administration is subject to a $40 copay, no deductible or coinsurance apply. PA is required, called [facility] to complete prior authorization, spoke with representative, due to this medication being Medical office buy and bill she is transferring me to the Medical department to complete prior authorization. Spoke with Stanton Kidney, authorization on file extended to 09/04/2022. Authorization #[id number]. Message sent to Rhae Hammock, LPN to order Prolia and contact patient to schedule injection.   Medications: Outpatient Encounter Medications as of 10/20/2021  Medication Sig   albuterol (VENTOLIN HFA) 108 (90 Base) MCG/ACT inhaler Inhale 1-2 puffs into the lungs every 6 (six) hours as needed for wheezing or shortness of breath.   amLODipine (NORVASC) 5 MG tablet TAKE 1 TABLET(5 MG) BY MOUTH DAILY (Patient taking differently: 10 mg.)   ASPIRIN LOW DOSE 81 MG EC tablet TAKE 1 TABLET(81 MG) BY MOUTH DAILY   atorvastatin (LIPITOR) 40 MG tablet Take 1 tablet (40 mg total) by mouth daily at 12 noon. (Patient taking differently: Take 40 mg by mouth daily with lunch.)   azelastine (ASTELIN) 0.1 % nasal spray Place 2 sprays into both nostrils daily as needed for rhinitis or allergies.   B Complex-C (B-COMPLEX WITH VITAMIN C) tablet Take 1 tablet by mouth daily with lunch.   Cholecalciferol (VITAMIN D3) 50 MCG (2000 UT) TABS Take 2,000 Units by mouth daily with lunch.    Continuous Blood Gluc Receiver (FREESTYLE LIBRE 2 READER) DEVI 1 each by Does not apply route 4 (four) times daily - after meals and at bedtime. E11.40   Continuous Blood Gluc Sensor (FREESTYLE LIBRE 2 SENSOR) MISC 2 each by Does not apply route every 14 (fourteen) days. E11.40   estradiol (ESTRACE) 0.1 MG/GM vaginal cream  Place 1 Applicatorful vaginally 2 (two) times a week. Uses twice weekly   ferrous sulfate 325 (65 FE) MG tablet Take 325 mg by mouth daily with lunch.   furosemide (LASIX) 40 MG tablet Take 1 tablet (40 mg total) by mouth daily.   HYDROcodone-acetaminophen (NORCO/VICODIN) 5-325 MG tablet Take 1 tablet by mouth every 4 (four) hours as needed for moderate pain.   Insulin Aspart FlexPen (NOVOLOG) 100 UNIT/ML Inject 4-10 Units into the skin 3 (three) times daily before meals.   insulin glargine, 1 Unit Dial, (TOUJEO) 300 UNIT/ML Solostar Pen Inject 15 Units into the skin at bedtime.   Insulin Pen Needle (BD PEN NEEDLE NANO 2ND GEN) 32G X 4 MM MISC USE AS DIRECTED   ipratropium-albuterol (DUONEB) 0.5-2.5 (3) MG/3ML SOLN Take 3 mLs by nebulization 4 (four) times daily as needed (wheezing/shortness of breath).   isosorbide mononitrate (IMDUR) 30 MG 24 hr tablet Take 1 tablet (30 mg total) by mouth daily.   lisinopril (ZESTRIL) 20 MG tablet TAKE 2 TABLETS(40 MG) BY MOUTH TWICE DAILY   magnesium oxide (MAG-OX) 400 (240 Mg) MG tablet TAKE 2 TABLETS(800 MG) BY MOUTH TWICE DAILY (Patient taking differently: Take 800 mg by mouth 2 (two) times daily.)   Menthol-Methyl Salicylate (SALONPAS PAIN RELIEF PATCH) PTCH Apply 1 patch topically daily as needed (pain.).   methocarbamol (ROBAXIN) 750 MG tablet Take 750 mg by mouth 3 (three) times daily.   ondansetron (ZOFRAN-ODT) 8 MG disintegrating tablet Take 8 mg by mouth every 8 (eight) hours as needed for nausea or vomiting.  pantoprazole (PROTONIX) 40 MG tablet Take 1 tablet (40 mg total) by mouth every morning.   polyethylene glycol (MIRALAX / GLYCOLAX) 17 g packet Take 17 g by mouth as needed. Uses 1-2 times weekly   promethazine-dextromethorphan (PROMETHAZINE-DM) 6.25-15 MG/5ML syrup Take 5 mLs by mouth 4 (four) times daily as needed for cough.   senna-docusate (SENOKOT-S) 8.6-50 MG tablet Take 1 tablet by mouth at bedtime.   sodium chloride (OCEAN) 0.65 % SOLN  nasal spray Place 1-2 sprays into both nostrils 4 (four) times daily as needed for congestion.   trimethoprim (TRIMPEX) 100 MG tablet Take 100 mg by mouth at bedtime.   Vibegron (GEMTESA PO) Take by mouth at bedtime.   warfarin (COUMADIN) 1 MG tablet TAKE AS DIRECTED. SEE ADMIN INSTRUCTIONS.   warfarin (COUMADIN) 5 MG tablet TAKE 1 TABLET BY MOUTH EVERY DAY (Patient taking differently: Take by mouth as directed.)   No facility-administered encounter medications on file as of 10/20/2021.   Pattricia Boss, [place] Pharmacist Assistant [phone]

## 2021-10-20 NOTE — Progress Notes (Signed)
Blood count normal.  Liver function normal.  Kidney function abnormal, but stable.  Thyroid function normal.  Cholesterol: good HBA1C: 7.4. DIABETES fairly well controlled. Athritis panel showed some results concerning for possibly Rheumatoid arthritis.  Recommend referral to rheumatology. Dr. Vernelle Emerald.  B12 levels pending still.

## 2021-10-21 ENCOUNTER — Other Ambulatory Visit: Payer: Self-pay

## 2021-10-21 ENCOUNTER — Telehealth: Payer: Self-pay

## 2021-10-21 DIAGNOSIS — H353132 Nonexudative age-related macular degeneration, bilateral, intermediate dry stage: Secondary | ICD-10-CM | POA: Diagnosis not present

## 2021-10-21 DIAGNOSIS — H348322 Tributary (branch) retinal vein occlusion, left eye, stable: Secondary | ICD-10-CM | POA: Diagnosis not present

## 2021-10-21 DIAGNOSIS — H35372 Puckering of macula, left eye: Secondary | ICD-10-CM | POA: Diagnosis not present

## 2021-10-21 DIAGNOSIS — M059 Rheumatoid arthritis with rheumatoid factor, unspecified: Secondary | ICD-10-CM

## 2021-10-21 LAB — HM DIABETES EYE EXAM

## 2021-10-21 NOTE — Progress Notes (Signed)
Patient informed.  She is willing to proceed with referral to Rheumatology.

## 2021-10-21 NOTE — Telephone Encounter (Signed)
Patient informed.  She is willing to proceed with referral to Rheumatology.

## 2021-10-21 NOTE — Progress Notes (Signed)
re

## 2021-10-21 NOTE — Telephone Encounter (Signed)
-----   Message from [name], MD sent at 10/20/2021  3:33 PM EST ----- Blood count normal.  Liver function normal.  Kidney function abnormal, but stable.  Thyroid function normal.  Cholesterol: good HBA1C: 7.4. DIABETES fairly well controlled. Athritis panel showed some results concerning for possibly Rheumatoid arthritis.  Recommend referral to rheumatology. Dr. Vernelle Emerald.  B12 levels pending still.

## 2021-10-21 NOTE — Telephone Encounter (Signed)
Will let PCP know

## 2021-10-24 DIAGNOSIS — M2550 Pain in unspecified joint: Secondary | ICD-10-CM | POA: Insufficient documentation

## 2021-10-24 DIAGNOSIS — R413 Other amnesia: Secondary | ICD-10-CM | POA: Insufficient documentation

## 2021-10-24 DIAGNOSIS — M791 Myalgia, unspecified site: Secondary | ICD-10-CM

## 2021-10-24 DIAGNOSIS — R519 Headache, unspecified: Secondary | ICD-10-CM | POA: Insufficient documentation

## 2021-10-24 DIAGNOSIS — R5383 Other fatigue: Secondary | ICD-10-CM | POA: Insufficient documentation

## 2021-10-24 DIAGNOSIS — G8929 Other chronic pain: Secondary | ICD-10-CM | POA: Insufficient documentation

## 2021-10-24 HISTORY — DX: Pain in unspecified joint: M25.50

## 2021-10-24 HISTORY — DX: Myalgia, unspecified site: M79.10

## 2021-10-24 NOTE — Assessment & Plan Note (Signed)
Due to coumadin which is necessary due to atrial fibrillation and protein S deficiency.

## 2021-10-24 NOTE — Assessment & Plan Note (Signed)
Control: fairly good.  Recommend check sugars fasting daily. Recommend check feet daily. Recommend annual eye exams. Medicines: Toujeo 15 units at bed time. Novolog Pen injector 4-10 units three times day with meals  SSI 90-150 4 U 151-250 +1 251-300 +2 301-350 +3 351- 400 +4 >400 +5Continue to work on eating a healthy diet and exercise.  Labs drawn today.

## 2021-10-24 NOTE — Assessment & Plan Note (Signed)
Management per specialist. Hematology.  The current medical regimen is effective;  continue present plan and medications.

## 2021-10-24 NOTE — Assessment & Plan Note (Signed)
Hypertension well controlled.  CKD stage 3b stable.  The current medical regimen is effective;  continue present plan and medications.

## 2021-10-24 NOTE — Assessment & Plan Note (Signed)
Check labs. Concerning for RA.

## 2021-10-24 NOTE — Assessment & Plan Note (Signed)
Rule out PMR.

## 2021-10-24 NOTE — Assessment & Plan Note (Signed)
Check labs.  Consider mri of brain.

## 2021-10-24 NOTE — Assessment & Plan Note (Signed)
Well controlled.  ?No changes to medicines.  ?Continue to work on eating a healthy diet and exercise.  ?Labs drawn today.  ?

## 2021-10-24 NOTE — Assessment & Plan Note (Signed)
Recommend calcium with D. 

## 2021-10-24 NOTE — Assessment & Plan Note (Signed)
Check esr stat (came back normal.)

## 2021-10-24 NOTE — Assessment & Plan Note (Signed)
Check labs.  Rule out PMR.

## 2021-10-24 NOTE — Assessment & Plan Note (Signed)
The current medical regimen is effective;  continue present plan and medications.  

## 2021-10-25 ENCOUNTER — Telehealth: Payer: Self-pay | Admitting: Hematology and Oncology

## 2021-10-25 NOTE — Telephone Encounter (Signed)
Spoke with patient re home INR of 2.9. Advised her tocontinue Coumadin 5 mg Wed and Sun and 6 mg the rest of days. Recommended repeat home INR in 2 weeks. She verbalized understanding.

## 2021-11-03 DIAGNOSIS — N390 Urinary tract infection, site not specified: Secondary | ICD-10-CM | POA: Diagnosis not present

## 2021-11-03 DIAGNOSIS — N952 Postmenopausal atrophic vaginitis: Secondary | ICD-10-CM | POA: Diagnosis not present

## 2021-11-03 DIAGNOSIS — N3946 Mixed incontinence: Secondary | ICD-10-CM | POA: Diagnosis not present

## 2021-11-08 ENCOUNTER — Telehealth: Payer: Self-pay | Admitting: Hematology and Oncology

## 2021-11-08 LAB — PROTIME-INR: INR: 2 — AB (ref 0.80–1.20)

## 2021-11-08 NOTE — Telephone Encounter (Signed)
Spoke with patient regarding home INR 2.  This is lower than previous, but she denies any changes.  She will continue the current Coumadin 5 mg Wednesday and Sunday and 6 mg the rest of days.  She will repeat an INR at home on March 20th.  She she verbalizes understanding.  She knows to contact our office if she has any concerns. ?

## 2021-11-09 DIAGNOSIS — M79671 Pain in right foot: Secondary | ICD-10-CM | POA: Diagnosis not present

## 2021-11-11 IMAGING — RF DG ANKLE COMPLETE 3+V*L*
1 series · 2 of 2 positions shown · non-contrast
Comparison: 02/18/20

CLINICAL DATA: ORIF of ankle fracture.

EXAM:
DG C-ARM 1-60 MIN; LEFT ANKLE COMPLETE - 3+ VIEW
FLUOROSCOPY TIME:  Fluoroscopy Time:  20 seconds
Radiation Exposure Index (if provided by the fluoroscopic device):
0.3 mGy
Number of Acquired Spot Images: 0

[Series 1: run · 2 of 2 slices shown]
[im 1/2]
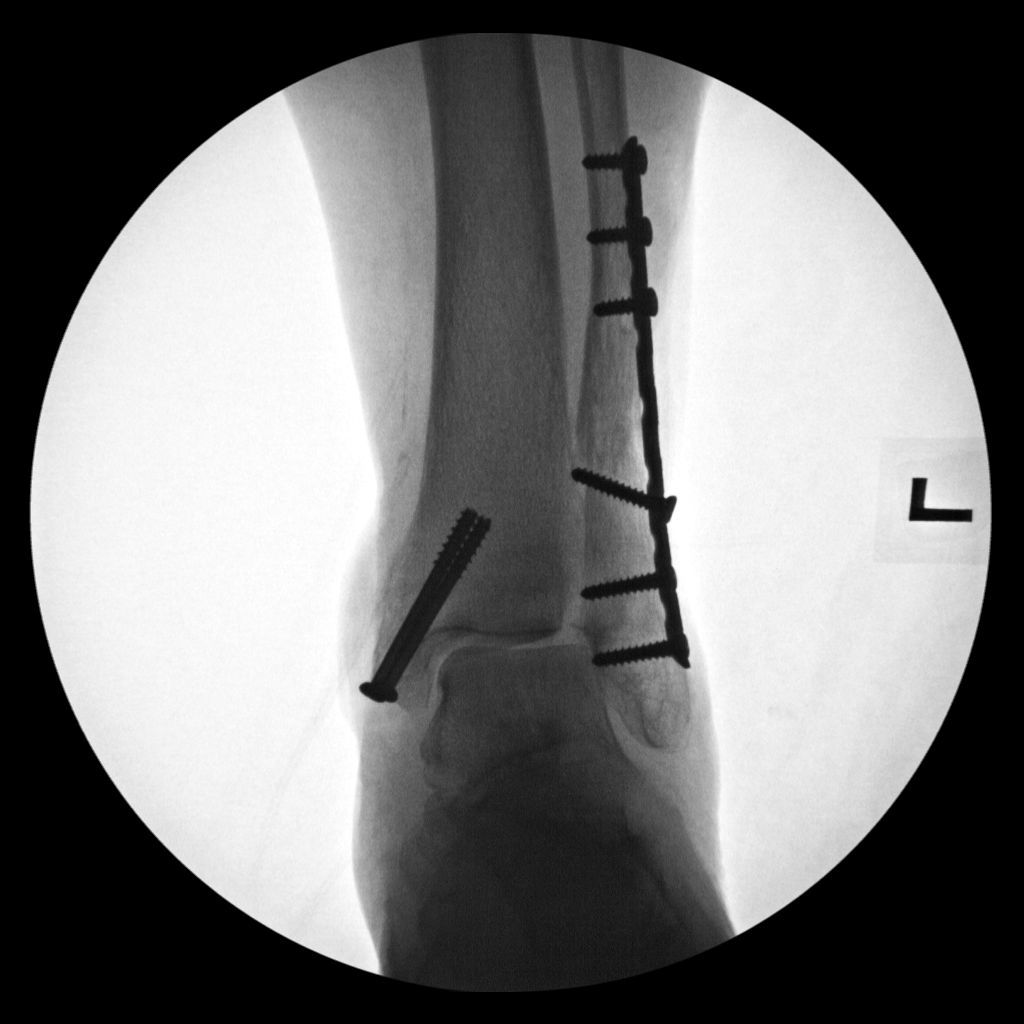
[im 2/2]
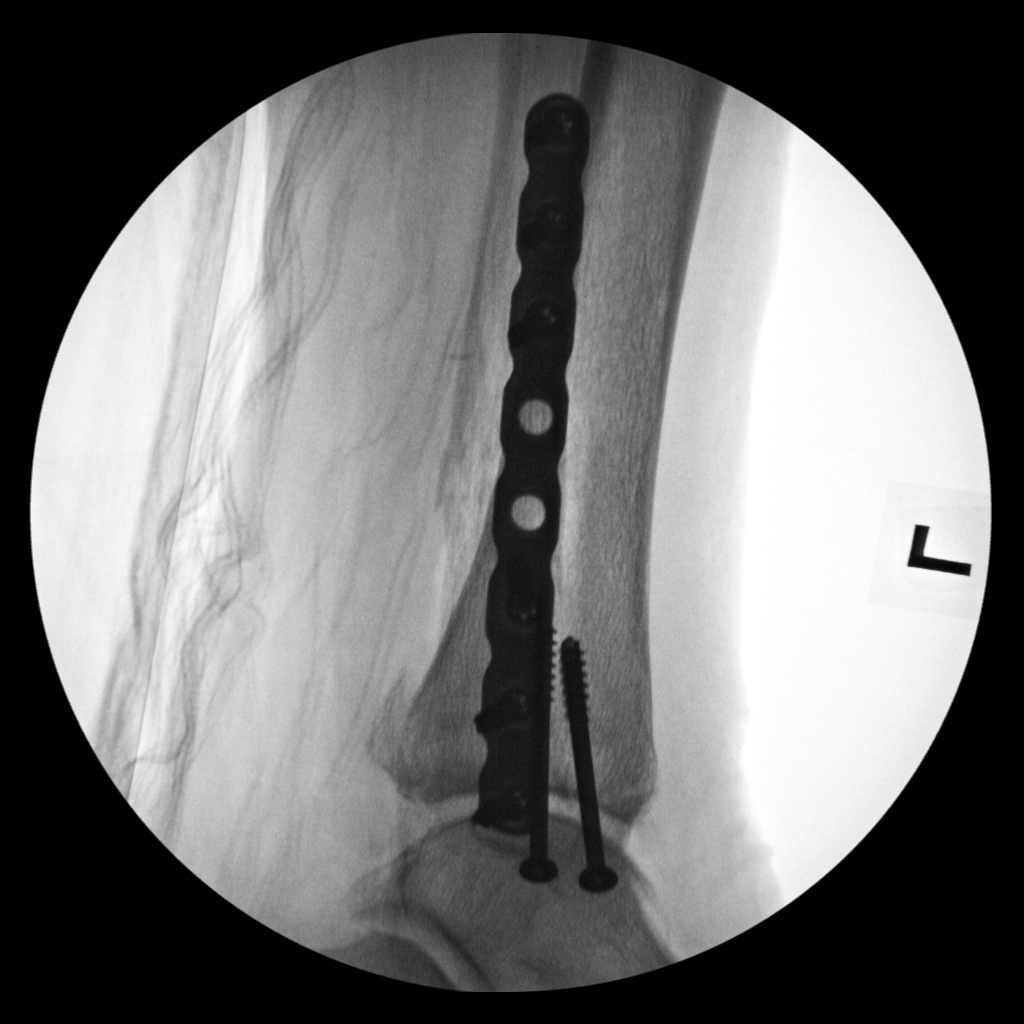

[2 of 2 positions shown; findings below may reference images not displayed]

FINDINGS: Two images from portable C-arm radiography show interval plate and
screw fixation of the distal fibula. Additionally, there is been
screw fixation of the medial malleolus. The hardware components and
fracture fragments are in anatomic alignment.
IMPRESSION: Status post ORIF of distal tibial and fibular fractures.

## 2021-11-16 ENCOUNTER — Other Ambulatory Visit: Payer: Self-pay

## 2021-11-16 ENCOUNTER — Ambulatory Visit (INDEPENDENT_AMBULATORY_CARE_PROVIDER_SITE_OTHER): Payer: Medicare PPO | Admitting: Family Medicine

## 2021-11-16 VITALS — BP 128/78 | HR 92 | Temp 97.4°F | Resp 18 | Ht 62.0 in | Wt 186.0 lb

## 2021-11-16 DIAGNOSIS — M80071A Age-related osteoporosis with current pathological fracture, right ankle and foot, initial encounter for fracture: Secondary | ICD-10-CM

## 2021-11-16 DIAGNOSIS — E1140 Type 2 diabetes mellitus with diabetic neuropathy, unspecified: Secondary | ICD-10-CM | POA: Diagnosis not present

## 2021-11-16 DIAGNOSIS — M8589 Other specified disorders of bone density and structure, multiple sites: Secondary | ICD-10-CM | POA: Diagnosis not present

## 2021-11-16 DIAGNOSIS — M791 Myalgia, unspecified site: Secondary | ICD-10-CM | POA: Diagnosis not present

## 2021-11-16 DIAGNOSIS — Z794 Long term (current) use of insulin: Secondary | ICD-10-CM | POA: Diagnosis not present

## 2021-11-16 MED ORDER — DENOSUMAB 60 MG/ML ~~LOC~~ SOSY: 60.0000 mg | PREFILLED_SYRINGE | Freq: Once | SUBCUTANEOUS | 0 refills | Status: DC

## 2021-11-16 MED ORDER — DENOSUMAB 60 MG/ML ~~LOC~~ SOSY
60.0000 mg | PREFILLED_SYRINGE | Freq: Once | SUBCUTANEOUS | Status: AC
  Administered 2021-11-16: 60 mg via SUBCUTANEOUS

## 2021-11-16 NOTE — Patient Instructions (Addendum)
Hold the lipitor.  ? ?Start on Ozempic 0.25 mg weekly, and then increase to 0.5 mg weekly.  ? ?Toujeo 15 U daily  ? ?Novolog Pen injector 4-10 units three times day with meals ?SSI 90-150 take 2 U ?151-250 +1 ?251-300 +2 ?301-350 +3 ?351- 400 +4 ?>400 +5 ? ?Hyland's cramps or you can try one cup of tonic water daily. ? ? ?

## 2021-11-16 NOTE — Progress Notes (Signed)
? ?Subjective:  ?Patient ID: Pamela [name], female    DOB: [birth year]  Age: 76 y.o. MRN: [id number] ? ?Chief Complaint  ?Patient presents with  ? Diabetes  ? ? ?HPI ? DM: Sugars are still up and down. Will average a low sugar once in 24 hours, but the timing changes and is all over.  ? Novolog Pen injector 4-10 units three times day with meals, Toujeo 15 units at bed time. ?SSI 90-150 4 U ?151-250 +1 ?251-300 +2 ?301-350 +3 ?351- 400 +4 ?>400 +5 Novolog Pen injector 4-10 units three times day with meals, Toujeo 15 units at bed time. ? ? ?Poor sleep. Unable to return to sleep after getting up to go to the bathroom. Her cgm also wakes her up  ?Current Outpatient Medications on File Prior to Visit  ?Medication Sig Dispense Refill  ? albuterol (VENTOLIN HFA) 108 (90 Base) MCG/ACT inhaler Inhale 1-2 puffs into the lungs every 6 (six) hours as needed for wheezing or shortness of breath. 1 each 2  ? amLODipine (NORVASC) 5 MG tablet TAKE 1 TABLET(5 MG) BY MOUTH DAILY (Patient taking differently: 10 mg.) 90 tablet 1  ? ASPIRIN LOW DOSE 81 MG EC tablet TAKE 1 TABLET(81 MG) BY MOUTH DAILY 90 tablet 3  ? atorvastatin (LIPITOR) 40 MG tablet Take 1 tablet (40 mg total) by mouth daily at 12 noon. (Patient taking differently: Take 40 mg by mouth daily with lunch.) 90 tablet 1  ? azelastine (ASTELIN) 0.1 % nasal spray Place 2 sprays into both nostrils daily as needed for rhinitis or allergies. 30 mL 6  ? B Complex-C (B-COMPLEX WITH VITAMIN C) tablet Take 1 tablet by mouth daily with lunch.    ? Cholecalciferol (VITAMIN D3) 50 MCG (2000 UT) TABS Take 2,000 Units by mouth daily with lunch.     ? Continuous Blood Gluc Receiver (FREESTYLE LIBRE 2 READER) DEVI 1 each by Does not apply route 4 (four) times daily - after meals and at bedtime. E11.40 1 each 0  ? Continuous Blood Gluc Sensor (FREESTYLE LIBRE 2 SENSOR) MISC 2 each by Does not apply route every 14 (fourteen) days. E11.40 6 each 3  ? estradiol (ESTRACE) 0.1 MG/GM  vaginal cream Place 1 Applicatorful vaginally 2 (two) times a week. Uses twice weekly    ? ferrous sulfate 325 (65 FE) MG tablet Take 325 mg by mouth daily with lunch.    ? furosemide (LASIX) 40 MG tablet Take 1 tablet (40 mg total) by mouth daily. 90 tablet 3  ? HYDROcodone-acetaminophen (NORCO/VICODIN) 5-325 MG tablet Take 1 tablet by mouth every 4 (four) hours as needed for moderate pain. 20 tablet 0  ? Insulin Aspart FlexPen (NOVOLOG) 100 UNIT/ML Inject 4-10 Units into the skin 3 (three) times daily before meals. 15 mL 3  ? insulin glargine, 1 Unit Dial, (TOUJEO) 300 UNIT/ML Solostar Pen Inject 15 Units into the skin at bedtime. 135 mL 1  ? Insulin Pen Needle (BD PEN NEEDLE NANO 2ND GEN) 32G X 4 MM MISC USE AS DIRECTED 100 each 3  ? ipratropium-albuterol (DUONEB) 0.5-2.5 (3) MG/3ML SOLN Take 3 mLs by nebulization 4 (four) times daily as needed (wheezing/shortness of breath).  0  ? isosorbide mononitrate (IMDUR) 30 MG 24 hr tablet Take 1 tablet (30 mg total) by mouth daily. 90 tablet 3  ? lisinopril (ZESTRIL) 20 MG tablet TAKE 2 TABLETS(40 MG) BY MOUTH TWICE DAILY 180 tablet 1  ? magnesium oxide (MAG-OX) 400 (240 Mg) MG tablet  TAKE 2 TABLETS(800 MG) BY MOUTH TWICE DAILY (Patient taking differently: Take 800 mg by mouth 2 (two) times daily.) 120 tablet 2  ? Menthol-Methyl Salicylate (SALONPAS PAIN RELIEF PATCH) PTCH Apply 1 patch topically daily as needed (pain.).    ? methocarbamol (ROBAXIN) 750 MG tablet Take 750 mg by mouth 3 (three) times daily.    ? ondansetron (ZOFRAN-ODT) 8 MG disintegrating tablet Take 8 mg by mouth every 8 (eight) hours as needed for nausea or vomiting.     ? pantoprazole (PROTONIX) 40 MG tablet Take 1 tablet (40 mg total) by mouth every morning. 90 tablet 3  ? polyethylene glycol (MIRALAX / GLYCOLAX) 17 g packet Take 17 g by mouth as needed. Uses 1-2 times weekly    ? promethazine-dextromethorphan (PROMETHAZINE-DM) 6.25-15 MG/5ML syrup Take 5 mLs by mouth 4 (four) times daily as needed  for cough. 118 mL 0  ? senna-docusate (SENOKOT-S) 8.6-50 MG tablet Take 1 tablet by mouth at bedtime.    ? sodium chloride (OCEAN) 0.65 % SOLN nasal spray Place 1-2 sprays into both nostrils 4 (four) times daily as needed for congestion.    ? trimethoprim (TRIMPEX) 100 MG tablet Take 100 mg by mouth at bedtime.    ? Vibegron (GEMTESA PO) Take by mouth at bedtime.    ? ?No current facility-administered medications on file prior to visit.  ? ?Past Medical History:  ?Diagnosis Date  ? Anticoagulated on Coumadin   ? chronic--- managed by hematology/ oncology,  ? Asthma, mild intermittent   ? followed by pcp--- per pt last exacerbation winter 2021 w/ acute bronchitis  ? Bilateral leg cramps   ? Chronic constipation   ? CKD (chronic kidney disease), stage III ([place])   ? Closed bimalleolar fracture of left ankle 02/26/2020  ? DDD (degenerative disc disease), cervical   ? w/ spondylosis,  per pt last steroid injection 06/ 2022  ? DDD (degenerative disc disease), lumbosacral   ? DVT (deep venous thrombosis) ([place]) 07/30/2013  ? Dyspnea   ? occasionally  ? GERD (gastroesophageal reflux disease)   ? History of cardiac murmur as a child   ? History of DVT of lower extremity   ? left lower extremity in 1980s, fell when bowling  ? History of pulmonary embolus (PE) 1993  ? per pt left lung post op 2 wks cholecystectomy  ? History of rheumatic fever as a child   ? per last echo 12-31-2019 no valvular issues  ? History of TIA (transient ischemic attack)   ? 2014 and 2018 or 2019,  per pt no residual  ? HTN (hypertension)   ? followed by pcp  ? Hyperlipidemia   ? IDA (iron deficiency anemia)   ? Macular degeneration of both eyes   ? Mild obstructive sleep apnea   ? per pt dx 2017 tried to uses cpap but intolerant  ? Mixed incontinence urge and stress   ? urologist--- dr Nila Nephew  ? OA (osteoarthritis) 07/06/2018  ? Osteoporosis   ? PAF (paroxysmal atrial fibrillation) ([place]) 10/01/2014  ? cardiologist--- dr Godfrey Pick tobb;   cardiac cath  02-18-2013 normal coronaries arteries, ef 50%, cath done since echo showed ef 30-35%; nucleat stress study 04/ 2020 normal , normal echo 04/ 2021,  event monitor 09-14-2020 rare ST/AT variable block  ? PONV (postoperative nausea and vomiting)   ? Protein S deficiency ([place])   ? followed by hemotology/ oncology-- dr d. Bobby Rumpf ([facility]) dx 1980s;  prior DVT left lower leg  1980s and left lung PE 1993; chronic  coumadin since 1980s  ? PVC's (premature ventricular contractions)   ? followed by cardiology  ? S/P cardiac catheterization 02/2013  ? Normal coronaries; low normal EF at 50%  ? Solitary pulmonary nodule on lung CT 02/06/2019  ? First noted 01/13/2014 > no change as of 12/21/2018  ? Spondylolisthesis, lumbar region 08/09/2018  ? Stroke [facility])   ? TIA in 2018 or 2019  ? Transient ischemic attack 07/30/2013  ? Type 2 diabetes mellitus treated with insulin ([place])   ? endocrilogist--- whitney reardon NP     (03-10-2021  pt continuously checks blood sugar throughout the day w/ Libre, fasting sugar --- 69--200)  ? Wears glasses   ? Wears hearing aid in both ears   ? ?Past Surgical History:  ?Procedure Laterality Date  ? CARDIAC CATHETERIZATION  02/18/2013  ? @ [place]  by Dr Martinique;  normal coronaries w/ preserved LVF, ef 50%;   previous cath 2001 normal ef 65%  ? CARPAL TUNNEL RELEASE Bilateral 1994  ? CATARACT EXTRACTION W/ INTRAOCULAR LENS IMPLANT Bilateral 2017  ? CHOLECYSTECTOMY, LAPAROSCOPIC  1993  ? COLONOSCOPY    ? FINGER SURGERY Left 2018  ? thumb  ? FOOT TENDON SURGERY Right   ? early 2000s  ? HARDWARE REMOVAL Left 03/12/2021  ? Procedure: HARDWARE REMOVAL;  Surgeon: Nicholes Stairs, MD;  Location: [facility];  Service: Orthopedics;  Laterality: Left;  60 MINS  ? ORIF ANKLE FRACTURE Left 02/26/2020  ? Procedure: OPEN REDUCTION INTERNAL FIXATION (ORIF) ANKLE FRACTURE;  Surgeon: Nicholes Stairs, MD;  Location: [place];  Service: Orthopedics;  Laterality: Left;  90 mins  ?  TONSILLECTOMY  1950  ? TOTAL KNEE ARTHROPLASTY Left 03/2005  ? TOTAL VAGINAL HYSTERECTOMY  1988  ? per pt still has ovaries  ?  ?Family History  ?Problem Relation Age of Onset  ? Cirrhosis Mother   ? Antithrombin

## 2021-11-22 ENCOUNTER — Telehealth: Payer: Self-pay

## 2021-11-22 DIAGNOSIS — E1122 Type 2 diabetes mellitus with diabetic chronic kidney disease: Secondary | ICD-10-CM | POA: Diagnosis not present

## 2021-11-22 LAB — POCT INR: INR: 1.4 — AB (ref 2.0–3.0)

## 2021-11-22 NOTE — Telephone Encounter (Addendum)
Pt notified that Melissa,NP, agrees to pt's recommendation. Pt verbalized understanding. ? ? RE: INR 1.4 ?Received: Today ?Melodye Ped, NP  Dairl Ponder, RN ?She can do that. Just have her switch to the 6 on M-W-F and 5 every other.   ?  ?   ?Previous Messages ?  ?----- Message -----  ?From: Dairl Ponder, RN  ?Sent: 11/22/2021  10:09 AM EDT  ?To: Melodye Ped, NP  ?Subject: RE: INR 1.4                                    ? ?When I called pt to notify her of your recommendation, she was a little nervous. She states if she goes back to '6mg'$  daily, it will make it too high. I asked her what she normally does when her INR is low. She reports that she usually adds an extra day of '6mg'$  (Coumadin '6mg'$  Mon, Wed, and Frid; Coumadin '5mg'$  the rest of days). Please advise.  ? ? ?Pt is currently taking Coumadin '6mg'$  po on Tues and Sunday. Coumadin '5mg'$  on rest of days. Message sent to [facility]. ?

## 2021-11-26 ENCOUNTER — Encounter: Payer: Self-pay | Admitting: Family Medicine

## 2021-11-26 NOTE — Assessment & Plan Note (Signed)
prolia given today 

## 2021-11-26 NOTE — Assessment & Plan Note (Signed)
?  Start on Ozempic 0.25 mg weekly, and then increase to 0.5 mg weekly.  ? ?Toujeo 15 U daily  ? ?Novolog Pen injector 4-10 units three times day with meals ?SSI 90-150 take 2 U ?151-250 +1 ?251-300 +2 ?301-350 +3 ?351- 400 +4 ?>400 +5 ? ?

## 2021-11-26 NOTE — Assessment & Plan Note (Signed)
?  Hyland's cramps or you can try one cup of tonic water daily. ?

## 2021-11-30 ENCOUNTER — Telehealth: Payer: Self-pay

## 2021-11-30 NOTE — Telephone Encounter (Signed)
-----   Message from Melodye Ped, NP sent at 11/30/2021  3:43 PM EDT ----- ?Regarding: RE: warfin ?Let's do 6 mg daily.  ?----- Message ----- ?From: Georgette Shell, RN ?Sent: 11/30/2021   3:06 PM EDT ?To: Melodye Ped, NP ?Subject: warfin                                        ? ?Pt called her current INR is 1.6.  She is currently taking warfin '6mg'$  on M W F and '5mg'$  the other days.  Want to know if she needs to stay at current dose or change. ? ? ? ?

## 2021-11-30 NOTE — Telephone Encounter (Signed)
Spoke with patient she is going to start taking '6mg'$  daily. ?

## 2021-12-04 ENCOUNTER — Other Ambulatory Visit: Payer: Self-pay | Admitting: Family Medicine

## 2021-12-06 DIAGNOSIS — M79671 Pain in right foot: Secondary | ICD-10-CM | POA: Diagnosis not present

## 2021-12-06 DIAGNOSIS — S92352A Displaced fracture of fifth metatarsal bone, left foot, initial encounter for closed fracture: Secondary | ICD-10-CM | POA: Insufficient documentation

## 2021-12-06 DIAGNOSIS — S92354A Nondisplaced fracture of fifth metatarsal bone, right foot, initial encounter for closed fracture: Secondary | ICD-10-CM | POA: Diagnosis not present

## 2021-12-06 HISTORY — DX: Displaced fracture of fifth metatarsal bone, left foot, initial encounter for closed fracture: S92.352A

## 2021-12-06 NOTE — Telephone Encounter (Signed)
Refill sent to pharmacy.   

## 2021-12-07 DIAGNOSIS — N390 Urinary tract infection, site not specified: Secondary | ICD-10-CM | POA: Diagnosis not present

## 2021-12-09 ENCOUNTER — Ambulatory Visit (INDEPENDENT_AMBULATORY_CARE_PROVIDER_SITE_OTHER): Payer: Medicare PPO | Admitting: Nurse Practitioner

## 2021-12-09 ENCOUNTER — Encounter: Payer: Self-pay | Admitting: Nurse Practitioner

## 2021-12-09 VITALS — BP 126/65 | HR 66 | Ht 62.0 in | Wt 180.0 lb

## 2021-12-09 DIAGNOSIS — N1832 Chronic kidney disease, stage 3b: Secondary | ICD-10-CM | POA: Diagnosis not present

## 2021-12-09 DIAGNOSIS — Z794 Long term (current) use of insulin: Secondary | ICD-10-CM | POA: Diagnosis not present

## 2021-12-09 DIAGNOSIS — E782 Mixed hyperlipidemia: Secondary | ICD-10-CM

## 2021-12-09 DIAGNOSIS — E1140 Type 2 diabetes mellitus with diabetic neuropathy, unspecified: Secondary | ICD-10-CM | POA: Diagnosis not present

## 2021-12-09 DIAGNOSIS — E1122 Type 2 diabetes mellitus with diabetic chronic kidney disease: Secondary | ICD-10-CM | POA: Diagnosis not present

## 2021-12-09 DIAGNOSIS — I10 Essential (primary) hypertension: Secondary | ICD-10-CM | POA: Diagnosis not present

## 2021-12-09 MED ORDER — INSULIN ASPART FLEXPEN 100 UNIT/ML ~~LOC~~ SOPN: 2.0000 [IU] | PEN_INJECTOR | Freq: Three times a day (TID) | SUBCUTANEOUS | 3 refills | Status: DC

## 2021-12-09 MED ORDER — INSULIN GLARGINE (1 UNIT DIAL) 300 UNIT/ML ~~LOC~~ SOPN: 10.0000 [IU] | PEN_INJECTOR | Freq: Every day | SUBCUTANEOUS | 1 refills | Status: DC

## 2021-12-09 NOTE — Progress Notes (Signed)
? ?                                                    Endocrinology Follow Up Note  ?     12/09/2021, 10:09 AM ? ? ?Subjective:  ? ? Patient ID: Pamela [name], female    DOB: 17-Feb-1946.  ?Pamela [name] is being seen in follow up after being seen in consultation for management of currently uncontrolled symptomatic diabetes requested by  [name], MD. ? ? ?Past Medical History:  ?Diagnosis Date  ? Anticoagulated on Coumadin   ? chronic--- managed by hematology/ oncology,  ? Asthma, mild intermittent   ? followed by pcp--- per pt last exacerbation winter 2021 w/ acute bronchitis  ? Bilateral leg cramps   ? Chronic constipation   ? CKD (chronic kidney disease), stage III ([place])   ? Closed bimalleolar fracture of left ankle 02/26/2020  ? DDD (degenerative disc disease), cervical   ? w/ spondylosis,  per pt last steroid injection 06/ 2022  ? DDD (degenerative disc disease), lumbosacral   ? DVT (deep venous thrombosis) ([place]) 07/30/2013  ? Dyspnea   ? occasionally  ? GERD (gastroesophageal reflux disease)   ? History of cardiac murmur as a child   ? History of DVT of lower extremity   ? left lower extremity in 1980s, fell when bowling  ? History of pulmonary embolus (PE) 1993  ? per pt left lung post op 2 wks cholecystectomy  ? History of rheumatic fever as a child   ? per last echo 12-31-2019 no valvular issues  ? History of TIA (transient ischemic attack)   ? 2014 and 2018 or 2019,  per pt no residual  ? HTN (hypertension)   ? followed by pcp  ? Hyperlipidemia   ? IDA (iron deficiency anemia)   ? Macular degeneration of both eyes   ? Mild obstructive sleep apnea   ? per pt dx 2017 tried to uses cpap but intolerant  ? Mixed incontinence urge and stress   ? urologist--- dr Nila Nephew  ? OA (osteoarthritis) 07/06/2018  ? Osteoporosis   ? PAF (paroxysmal atrial fibrillation) ([place]) 10/01/2014  ? cardiologist--- dr Godfrey Pick tobb;   cardiac cath 02-18-2013 normal coronaries arteries, ef  50%, cath done since echo showed ef 30-35%; nucleat stress study 04/ 2020 normal , normal echo 04/ 2021,  event monitor 09-14-2020 rare ST/AT variable block  ? PONV (postoperative nausea and vomiting)   ? Protein S deficiency ([place])   ? followed by hemotology/ oncology-- dr d. Bobby Rumpf ([facility]) dx 1980s;  prior DVT left lower leg 1980s and left lung PE 1993; chronic  coumadin since 1980s  ? PVC's (premature ventricular contractions)   ? followed by cardiology  ? S/P cardiac catheterization 02/2013  ? Normal coronaries; low normal EF at 50%  ? Solitary pulmonary nodule on lung CT 02/06/2019  ? First noted 01/13/2014 > no change as of 12/21/2018  ? Spondylolisthesis, lumbar region 08/09/2018  ? Stroke [facility])   ? TIA in 2018 or 2019  ? Transient ischemic attack 07/30/2013  ? Type 2 diabetes mellitus treated with insulin ([place])   ? endocrilogist--- Cristiano Capri NP     (03-10-2021  pt continuously checks blood sugar throughout the day w/ Libre, fasting sugar --- 69--200)  ?  Wears glasses   ? Wears hearing aid in both ears   ? ? ?Past Surgical History:  ?Procedure Laterality Date  ? CARDIAC CATHETERIZATION  02/18/2013  ? @ [place]  by Dr Martinique;  normal coronaries w/ preserved LVF, ef 50%;   previous cath 2001 normal ef 65%  ? CARPAL TUNNEL RELEASE Bilateral 1994  ? CATARACT EXTRACTION W/ INTRAOCULAR LENS IMPLANT Bilateral 2017  ? CHOLECYSTECTOMY, LAPAROSCOPIC  1993  ? COLONOSCOPY    ? FINGER SURGERY Left 2018  ? thumb  ? FOOT TENDON SURGERY Right   ? early 2000s  ? HARDWARE REMOVAL Left 03/12/2021  ? Procedure: HARDWARE REMOVAL;  Surgeon: Nicholes Stairs, MD;  Location: [facility];  Service: Orthopedics;  Laterality: Left;  60 MINS  ? ORIF ANKLE FRACTURE Left 02/26/2020  ? Procedure: OPEN REDUCTION INTERNAL FIXATION (ORIF) ANKLE FRACTURE;  Surgeon: Nicholes Stairs, MD;  Location: [place];  Service: Orthopedics;  Laterality: Left;  90 mins  ? TONSILLECTOMY  1950  ? TOTAL KNEE ARTHROPLASTY  Left 03/2005  ? TOTAL VAGINAL HYSTERECTOMY  1988  ? per pt still has ovaries  ? ? ?Social History  ? ?Socioeconomic History  ? Marital status: Married  ?  Spouse name: [name]  ? Number of children: Not on file  ? Years of education: Not on file  ? Highest education level: Not on file  ?Occupational History  ? Not on file  ?Tobacco Use  ? Smoking status: Never  ? Smokeless tobacco: Never  ?Vaping Use  ? Vaping Use: Never used  ?Substance and Sexual Activity  ? Alcohol use: No  ? Drug use: Never  ? Sexual activity: Not on file  ?Other Topics Concern  ? Not on file  ?Social History Narrative  ? Lives with spouse in [place].  2 grown daughters.  ? Retired Glass blower/designer    ? ?Social Determinants of Health  ? ?Financial Resource Strain: Not on file  ?Food Insecurity: Not on file  ?Transportation Needs: Not on file  ?Physical Activity: Not on file  ?Stress: Not on file  ?Social Connections: Not on file  ? ? ?Family History  ?Problem Relation Age of Onset  ? Cirrhosis Mother   ? Antithrombin III deficiency Mother   ?     multiple emboli  ? Ulcerative colitis Mother   ? Coronary artery disease Father   ? Heart attack Father   ? Hypertension Father   ? Protein S deficiency Sister   ?     prior DVT  ? Protein S deficiency Daughter   ? Hypertension Sister   ? Hypertension Brother   ? Lung cancer Brother   ? Heart attack Brother   ? Heart attack Sister   ?     age 85   ? Stroke Neg Hx   ? Colitis Neg Hx   ? Colon polyps Neg Hx   ? Esophageal cancer Neg Hx   ? Liver cancer Neg Hx   ? Pancreatic cancer Neg Hx   ? Rectal cancer Neg Hx   ? Stomach cancer Neg Hx   ? ? ?Outpatient Encounter Medications as of 12/09/2021  ?Medication Sig  ? albuterol (VENTOLIN HFA) 108 (90 Base) MCG/ACT inhaler Inhale 1-2 puffs into the lungs every 6 (six) hours as needed for wheezing or shortness of breath.  ? amLODipine (NORVASC) 5 MG tablet TAKE 1 TABLET(5 MG) BY MOUTH DAILY (Patient taking differently: 10 mg.)  ? ASPIRIN LOW DOSE 81 MG EC  tablet  TAKE 1 TABLET(81 MG) BY MOUTH DAILY  ? azelastine (ASTELIN) 0.1 % nasal spray Place 2 sprays into both nostrils daily as needed for rhinitis or allergies.  ? B Complex-C (B-COMPLEX WITH VITAMIN C) tablet Take 1 tablet by mouth daily with lunch.  ? BD PEN NEEDLE NANO 2ND GEN 32G X 4 MM MISC USE AS DIRECTED  ? Cholecalciferol (VITAMIN D3) 50 MCG (2000 UT) TABS Take 4,000 Units by mouth daily with lunch.  ? Continuous Blood Gluc Receiver (FREESTYLE LIBRE 2 READER) DEVI 1 each by Does not apply route 4 (four) times daily - after meals and at bedtime. E11.40  ? Continuous Blood Gluc Sensor (FREESTYLE LIBRE 2 SENSOR) MISC 2 each by Does not apply route every 14 (fourteen) days. E11.40  ? estradiol (ESTRACE) 0.1 MG/GM vaginal cream Place 1 Applicatorful vaginally 2 (two) times a week. Uses twice weekly  ? ferrous sulfate 325 (65 FE) MG tablet Take 325 mg by mouth daily with lunch.  ? GEMTESA 75 MG TABS Take by mouth.  ? HYDROcodone-acetaminophen (NORCO/VICODIN) 5-325 MG tablet Take 1 tablet by mouth every 4 (four) hours as needed for moderate pain.  ? ipratropium-albuterol (DUONEB) 0.5-2.5 (3) MG/3ML SOLN Take 3 mLs by nebulization 4 (four) times daily as needed (wheezing/shortness of breath).  ? lisinopril (ZESTRIL) 20 MG tablet TAKE 2 TABLETS(40 MG) BY MOUTH TWICE DAILY  ? magnesium oxide (MAG-OX) 400 (240 Mg) MG tablet TAKE 2 TABLETS(800 MG) BY MOUTH TWICE DAILY (Patient taking differently: Take 800 mg by mouth 2 (two) times daily.)  ? Menthol-Methyl Salicylate (SALONPAS PAIN RELIEF PATCH) PTCH Apply 1 patch topically daily as needed (pain.).  ? methocarbamol (ROBAXIN) 750 MG tablet Take 750 mg by mouth 3 (three) times daily.  ? nitrofurantoin, macrocrystal-monohydrate, (MACROBID) 100 MG capsule Take 100 mg by mouth 2 (two) times daily.  ? ondansetron (ZOFRAN-ODT) 8 MG disintegrating tablet Take 8 mg by mouth every 8 (eight) hours as needed for nausea or vomiting.   ? pantoprazole (PROTONIX) 40 MG tablet Take 1 tablet  (40 mg total) by mouth every morning.  ? polyethylene glycol (MIRALAX / GLYCOLAX) 17 g packet Take 17 g by mouth as needed. Uses 1-2 times weekly  ? promethazine-dextromethorphan (PROMETHAZINE-DM) 6.25-1

## 2021-12-09 NOTE — Patient Instructions (Signed)

## 2021-12-13 ENCOUNTER — Telehealth: Payer: Self-pay

## 2021-12-13 NOTE — Telephone Encounter (Signed)
-----   Message from Melodye Ped, NP sent at 12/13/2021 11:11 AM EDT ----- ?Regarding: RE: INR ?Ok, we can leave it at that. ?----- Message ----- ?From: Georgette Shell, RN ?Sent: 12/13/2021  11:01 AM EDT ?To: Melodye Ped, NP ?Subject: INR                                           ? ?Patient INR is 2.0 this morning.  She is currently taking '6mg'$  of coumadin daily. ? ? ?

## 2021-12-13 NOTE — Telephone Encounter (Signed)
Left message to stay on '6mg'$  of coumadin daily.   ?

## 2021-12-17 DIAGNOSIS — M79672 Pain in left foot: Secondary | ICD-10-CM

## 2021-12-17 HISTORY — DX: Pain in left foot: M79.672

## 2021-12-20 ENCOUNTER — Telehealth: Payer: Self-pay | Admitting: Hematology and Oncology

## 2021-12-20 ENCOUNTER — Telehealth: Payer: Self-pay

## 2021-12-20 NOTE — Telephone Encounter (Signed)
Pt called and state her INR is within range. It is 2.6 ?

## 2021-12-20 NOTE — Telephone Encounter (Signed)
Spoke with patient about home INR 2.6. Coumadin was increased to '6mg'$  daily 2 weeks ago. Will continue same dose as INR therapeutic and have her repeat home INR on May 1st. Patient verbalized understanding. ?

## 2021-12-21 ENCOUNTER — Other Ambulatory Visit: Payer: Self-pay | Admitting: Hematology and Oncology

## 2021-12-21 LAB — PROTIME-INR: INR: 2.6 — AB (ref 0.80–1.20)

## 2021-12-27 ENCOUNTER — Telehealth: Payer: Self-pay

## 2021-12-27 NOTE — Telephone Encounter (Signed)
Patient notified. She is going to continue the current dose and recheck as scheduled.  She may have to go to [place] to help with a sick family member but she will let us know.   ?

## 2021-12-27 NOTE — Telephone Encounter (Signed)
I left message on voicemail to call us back. 

## 2021-12-27 NOTE — Telephone Encounter (Signed)
Pamela [name] called to report that she had been doing very well with the ozempic 0.25 mg but when she increased to 0.5 mg she developed nausea and headache.  She does not feel that she is going to be able to tolerate the higher dose.  She is going to go back to the 0.25 dose and wait to hear back from [name].   ?

## 2021-12-27 NOTE — Progress Notes (Signed)
Office Visit Note  Patient: Pamela [name]             Date of Birth: 1946/04/07           MRN: [id number]             PCP: [name], MD Referring: [name], MD Visit Date: 12/28/2021   Subjective:  New Patient (Initial Visit) (Abnormal labs)   History of Present Illness: Camara Cataleah Stites is a 76 y.o. female here for evaluation of positive rheumatoid factor test that checked in association with multiple ongoing joint pains worse for about 6-9 months. She has a medical history significant for Afib and protein S deficiency on coumadin anticoagulation, CKD stage 3, and osteoporosis. Right hand and wrist and shoulder are her biggest complaint of worsening symptoms. Back pain is a major problem but not recently changed. She sustained multiple fractures at her ankles and feet over the past 2 years. She is on prolia for the past 3 years with osteoporosis also having apparently silent hip fracture. She had epidural steroid injections tried with mixed success, sometimes helping for days and sometimes for months.  She is having joint pain at multiple areas but particularly has some increased stiffness and pain affecting hands with some decreased grip strength or flexibility.  Right side is consistently worse and symptoms limit some activities due to increase in pain.  She is not on any long-term anti-inflammatory medications largely related to risk of from anticoagulation and CKD stage III.  Labs reviewed RF 40 CCP neg ANA neg ESR 25 CRP <1 CMP eGFR 41   Activities of Daily Living:  Patient reports morning stiffness for 3 hours.   Patient Reports nocturnal pain.  Difficulty dressing/grooming: Denies Difficulty climbing stairs: Reports Difficulty getting out of chair: Reports Difficulty using hands for taps, buttons, cutlery, and/or writing: Reports  Review of Systems  Constitutional:  Positive for fatigue.  HENT:  Negative for mouth dryness.   Eyes:   Positive for dryness.  Respiratory:  Positive for shortness of breath.   Cardiovascular:  Negative for swelling in legs/feet.  Gastrointestinal:  Positive for constipation and diarrhea.  Endocrine: Positive for cold intolerance, heat intolerance, excessive thirst and increased urination.  Genitourinary:  Positive for difficulty urinating and painful urination.  Musculoskeletal:  Positive for joint pain, gait problem, joint pain, muscle weakness, morning stiffness and muscle tenderness.  Skin:  Negative for rash.  Allergic/Immunologic: Positive for susceptible to infections.  Neurological:  Positive for tremors, numbness and weakness.  Hematological:  Positive for bruising/bleeding tendency.  Psychiatric/Behavioral:  Positive for sleep disturbance.     PMFS History:  Patient Active Problem List   Diagnosis Date Noted   Acute infection of nasal sinus 01/24/2022   Joint pain 10/24/2021   Muscle pain 10/24/2021   Other fatigue 10/24/2021   Hypertensive kidney disease with stage 3a chronic kidney disease ([place]) 06/27/2021   Acquired thrombophilia ([place]) 06/27/2021   Osteopenia 06/27/2021   Mixed hyperlipidemia 06/27/2021   Class 1 obesity due to excess calories with serious comorbidity and body mass index (BMI) of 33.0 to 33.9 in adult 06/27/2021   Type 2 diabetes mellitus with diabetic neuropathy, with long-term current use of insulin ([place]) 06/15/2021   Closed fracture of fourth metatarsal bone 05/07/2021   Anemia in chronic kidney disease 02/03/2021   Type 2 diabetes mellitus with vascular disease ([place]) 12/09/2020   Chronic diastolic congestive heart failure ([place]) 12/09/2020   Type 2 diabetes mellitus with  stage 3 chronic kidney disease (H[facility]) 12/09/2020   Asthma    Chronic constipation    DJD (degenerative joint disease)    GERD (gastroesophageal reflux disease)    Heart murmur    Sleep apnea    Solitary pulmonary nodule on lung CT 02/06/2019   Spondylolisthesis, lumbar region  08/09/2018   Osteoarthritis of right knee 07/06/2018   Atrial fibrillation [I48.91] 10/01/2014   Protein S deficiency ([place]) 03/03/2013   DOE (dyspnea on exertion) 03/03/2013   Ventricular premature beats 05/23/2011   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, NYHA class 1 ([place]) 05/23/2011    Past Medical History:  Diagnosis Date   Anticoagulated on Coumadin    chronic--- managed by hematology/ oncology,   Asthma, mild intermittent    followed by pcp--- per pt last exacerbation winter 2021 w/ acute bronchitis   Bilateral leg cramps    Chronic constipation    CKD (chronic kidney disease), stage III ([place])    Closed bimalleolar fracture of left ankle 02/26/2020   DDD (degenerative disc disease), cervical    w/ spondylosis,  per pt last steroid injection 06/ 2022   DDD (degenerative disc disease), lumbosacral    DVT (deep venous thrombosis) ([place]) 07/30/2013   Dyspnea    occasionally   GERD (gastroesophageal reflux disease)    History of cardiac murmur as a child    History of DVT of lower extremity    left lower extremity in 1980s, fell when bowling   History of pulmonary embolus (PE) 1993   per pt left lung post op 2 wks cholecystectomy   History of rheumatic fever as a child    per last echo 12-31-2019 no valvular issues   History of TIA (transient ischemic attack)    2014 and 2018 or 2019,  per pt no residual   HTN (hypertension)    followed by pcp   Hyperlipidemia    IDA (iron deficiency anemia)    Macular degeneration of both eyes    Mild obstructive sleep apnea    per pt dx 2017 tried to uses cpap but intolerant   Mixed incontinence urge and stress    urologist--- dr Nila Nephew   OA (osteoarthritis) 07/06/2018   Osteoporosis    PAF (paroxysmal atrial fibrillation) ([place]) 10/01/2014   cardiologist--- dr Godfrey Pick tobb;   cardiac cath 02-18-2013 normal coronaries arteries, ef 50%, cath done since echo showed ef 30-35%; nucleat stress study 04/ 2020 normal , normal echo  04/ 2021,  event monitor 09-14-2020 rare ST/AT variable block   PONV (postoperative nausea and vomiting)    Protein S deficiency ([place])    followed by hemotology/ oncology-- dr d. Bobby Rumpf ([facility]) dx 1980s;  prior DVT left lower leg 1980s and left lung PE 1993; chronic  coumadin since 1980s   PVC's (premature ventricular contractions)    followed by cardiology   S/P cardiac catheterization 02/2013   Normal coronaries; low normal EF at 50%   Solitary pulmonary nodule on lung CT 02/06/2019   First noted 01/13/2014 > no change as of 12/21/2018   Spondylolisthesis, lumbar region 08/09/2018   Stroke ([place])    TIA in 2018 or 2019   Transient ischemic attack 07/30/2013   Type 2 diabetes mellitus treated with insulin ([place])    endocrilogist--- whitney reardon NP     (03-10-2021  pt continuously checks blood sugar throughout the day w/ Libre, fasting sugar --- 69--200)   Wears glasses    Wears hearing aid in  both ears     Family History  Problem Relation Age of Onset   Cirrhosis Mother    Antithrombin III deficiency Mother        multiple emboli   Ulcerative colitis Mother    Diabetes Father    Coronary artery disease Father    Heart attack Father    Hypertension Father    Kidney disease Father    Hypertension Sister    Diabetes Sister    Heart attack Sister        age 58    Protein S deficiency Daughter    Heart attack Brother    Hypertension Brother    Lung cancer Brother    Stroke Neg Hx    Colitis Neg Hx    Colon polyps Neg Hx    Esophageal cancer Neg Hx    Liver cancer Neg Hx    Pancreatic cancer Neg Hx    Rectal cancer Neg Hx    Stomach cancer Neg Hx    Breast cancer Neg Hx    Past Surgical History:  Procedure Laterality Date   ABDOMINAL HYSTERECTOMY     BLEPHAROPLASTY     CARDIAC CATHETERIZATION  02/18/2013   @ [place]  by Dr Martinique;  normal coronaries w/ preserved LVF, ef 50%;   previous cath 2001 normal ef 65%   CARPAL TUNNEL RELEASE Bilateral 1994    CATARACT EXTRACTION W/ INTRAOCULAR LENS IMPLANT Bilateral 2017   CHOLECYSTECTOMY, LAPAROSCOPIC  1993   COLONOSCOPY     EAR BIOPSY Left    FINGER SURGERY Left 2018   thumb   FOOT SURGERY     FOOT TENDON SURGERY Right    early 2000s   HAND SURGERY     HARDWARE REMOVAL Left 03/12/2021   Procedure: HARDWARE REMOVAL;  Surgeon: Nicholes Stairs, MD;  Location: [facility];  Service: Orthopedics;  Laterality: Left;  60 MINS   ORIF ANKLE FRACTURE Left 02/26/2020   Procedure: OPEN REDUCTION INTERNAL FIXATION (ORIF) ANKLE FRACTURE;  Surgeon: Nicholes Stairs, MD;  Location: [facility];  Service: Orthopedics;  Laterality: Left;  90 mins   TONSILLECTOMY AND ADENOIDECTOMY Bilateral    TOTAL KNEE ARTHROPLASTY Left 03/2005   TOTAL VAGINAL HYSTERECTOMY  1988   per pt still has ovaries   Social History   Social History Narrative   Lives with spouse in [place].  2 grown daughters.   Retired Network engineer History  Administered Date(s) Administered   Fluad Quad(high Dose 65+) 05/26/2020, 05/13/2021   Influenza,inj,Quad PF,6+ Mos 06/04/2013   PFIZER Comirnaty(Gray Top)Covid-19 Tri-Sucrose Vaccine 02/04/2021   PFIZER(Purple Top)SARS-COV-2 Vaccination 09/22/2019, 10/12/2019, 06/16/2020   PNEUMOCOCCAL CONJUGATE-20 02/15/2022   Pfizer Covid-19 Vaccine Bivalent Booster 32yr & up 08/17/2021   Pneumococcal Conjugate-13 07/30/2014   Pneumococcal Polysaccharide-23 03/31/2008   Tdap 11/14/2013   Zoster, Live 03/31/2008, 06/03/2011     Objective: Vital Signs: BP (!) 112/57 (BP Location: Left Arm, Patient Position: Sitting, Cuff Size: Normal)   Pulse 80   Resp 17   Ht 5' (1.524 m)   Wt 180 lb (81.6 kg)   BMI 35.15 kg/m    Physical Exam Cardiovascular:     Rate and Rhythm: Normal rate and regular rhythm.  Pulmonary:     Effort: Pulmonary effort is normal.     Breath sounds: Normal breath sounds.  Musculoskeletal:     Right lower leg: No edema.     Left  lower leg: No edema.  Skin:  General: Skin is warm and dry.     Findings: No rash.  Neurological:     Mental Status: She is alert.  Psychiatric:        Mood and Affect: Mood normal.      Musculoskeletal Exam:  Right shoulder pain with overhead abduction some tenderness to pressure worse along the lateral border of the joint no effusion Elbows full ROM no tenderness or swelling Slightly decreased right wrist range of motion no palpable swelling Postsurgical change at right first [place] joint from arthroplasty, mild Heberden's nodes involving distal finger joints bilaterally Right ankle range of motion mildly decreased mild tenderness to pressure and passive range of motion, well-healed surgical scars   Investigation: No additional findings.  Imaging: No results found.  Recent Labs: Lab Results  Component Value Date   WBC 7.9 05/02/2022   HGB 13.4 05/02/2022   PLT 453 (H) 05/02/2022   NA 136 05/02/2022   K 4.5 05/02/2022   CL 94 (L) 05/02/2022   CO2 25 05/02/2022   GLUCOSE 189 (H) 05/02/2022   BUN 26 05/02/2022   CREATININE 1.09 (H) 05/02/2022   BILITOT 0.5 05/02/2022   ALKPHOS 81 05/02/2022   AST 27 05/02/2022   ALT 32 05/02/2022   PROT 6.9 05/02/2022   ALBUMIN 4.3 05/02/2022   CALCIUM 9.7 05/02/2022   GFRAA 70 09/11/2020    Speciality Comments: No specialty comments available.  Procedures:  No procedures performed Allergies: Ketek [telithromycin], Loxapine succinate, Naproxen sodium, Amoxapine and related, Darvon, Belsomra [suvorexant], Cymbalta [duloxetine hcl], Duloxetine, Gabapentin, Lyrica [pregabalin], Amoxicillin, and Propoxyphene   Assessment / Plan:     Visit Diagnoses: Rheumatoid factor positive Pain in right hand  Chronic joint pains more severe in right upper extremity and positive rheumatoid factor but no obvious synovitis present on exam today.  Previous inflammatory marker testing was normal.  She is established patient with Dr. Caralyn Guile and  status post first [facility] joint arthroplasty joint pains currently.  More consistent with the hand osteoarthritis.  I do not think her positive antibody test truly indicates rheumatoid arthritis.  Less symptomatic today it appears than at time of referral as well though.  I recommend she can follow-up with orthopedic surgery clinic for additional local treatments or if she needs to see pain management specialist no DMARD treatment recommendation at this time.  Orders: No orders of the defined types were placed in this encounter.  No orders of the defined types were placed in this encounter.    Follow-Up Instructions: No follow-ups on file.   Collier Salina, MD  Note - This record has been created using [employer].  Chart creation errors have been sought, but may not always  have been located. Such creation errors do not reflect on  the standard of medical care.

## 2021-12-28 ENCOUNTER — Encounter: Payer: Self-pay | Admitting: Internal Medicine

## 2021-12-28 ENCOUNTER — Ambulatory Visit (INDEPENDENT_AMBULATORY_CARE_PROVIDER_SITE_OTHER): Payer: Medicare PPO | Admitting: Internal Medicine

## 2021-12-28 VITALS — BP 112/57 | HR 80 | Resp 17 | Ht 60.0 in | Wt 180.0 lb

## 2021-12-28 DIAGNOSIS — R768 Other specified abnormal immunological findings in serum: Secondary | ICD-10-CM | POA: Diagnosis not present

## 2021-12-28 DIAGNOSIS — M79641 Pain in right hand: Secondary | ICD-10-CM | POA: Diagnosis not present

## 2021-12-29 ENCOUNTER — Telehealth: Payer: Self-pay

## 2021-12-29 DIAGNOSIS — S92355A Nondisplaced fracture of fifth metatarsal bone, left foot, initial encounter for closed fracture: Secondary | ICD-10-CM | POA: Diagnosis not present

## 2021-12-29 DIAGNOSIS — S92354A Nondisplaced fracture of fifth metatarsal bone, right foot, initial encounter for closed fracture: Secondary | ICD-10-CM | POA: Diagnosis not present

## 2021-12-29 MED ORDER — SEMAGLUTIDE(0.25 OR 0.5MG/DOS) 2 MG/1.5ML ~~LOC~~ SOPN
0.2500 mg | PEN_INJECTOR | SUBCUTANEOUS | 0 refills | Status: DC
Start: 2021-12-29 — End: 2022-03-30

## 2021-12-29 NOTE — Telephone Encounter (Signed)
Patient at a doctor's office when I called her. She is having issues with her foot but was unable to go in-depth due to her being in the waiting room. For this same reason, unable to go over sugars ? ?She is rather sad due to a death in her family, she's about to head to [place] for the funeral ? ?She did tell me that she is out of Ozempic, [name] gave her a sample and she doesn't have a script. Will let [facility] know.  ?

## 2021-12-29 NOTE — Telephone Encounter (Signed)
Patient calling requesting samples or script of ozempic 0.25 mg. She has been taking this for one month. She was advised by provider to continue this dose until next appointment which is 01/24/2022. Please advise how patient should obtain medication. She does need this quickly as there is a possibility she will need to travel out of state due to death in family.  ? ?Pamela [name] 12/29/21 8:49 AM ? ?

## 2021-12-29 NOTE — Telephone Encounter (Signed)
During call this morning, understood patient was taking 0.25 mg. However when called patient back, she has been taking 0.5 mg for three weeks but on 4/24 was advised to return to 0.25 mg. She requests for now 0.25 mg be sent to pharmacy.  ? ?This has been sent to pharmacy.  ? ?Pamela [name] 12/29/21 2:17 PM ? ?

## 2021-12-30 ENCOUNTER — Other Ambulatory Visit: Payer: Self-pay

## 2021-12-30 MED ORDER — RYBELSUS 7 MG PO TABS: 7.0000 mg | ORAL_TABLET | Freq: Every day | ORAL | 0 refills | Status: DC

## 2021-12-30 NOTE — Progress Notes (Signed)
Patient unable to find ozempic in stock. Dr [name] recommended Rybelsus 7 mg be sent as patient is going out of state.  ? ?Harrell Lark 12/30/21 9:58 AM ? ?

## 2022-01-04 ENCOUNTER — Telehealth: Payer: Self-pay | Admitting: Hematology and Oncology

## 2022-01-04 ENCOUNTER — Other Ambulatory Visit: Payer: Self-pay | Admitting: Family Medicine

## 2022-01-04 NOTE — Telephone Encounter (Signed)
Spoke with patient regarding home INR 3.2. She continues warfarin '6mg'$  daily. Will have her take '3mg'$  today, then continue '6mg'$  daily and have her repeat home INR in 1 week instead of 2 weeks. The patient verbalizes understanding. ?

## 2022-01-10 ENCOUNTER — Other Ambulatory Visit: Payer: Self-pay

## 2022-01-10 ENCOUNTER — Telehealth: Payer: Self-pay | Admitting: Hematology and Oncology

## 2022-01-10 MED ORDER — INSULIN GLARGINE (1 UNIT DIAL) 300 UNIT/ML ~~LOC~~ SOPN: 10.0000 [IU] | PEN_INJECTOR | Freq: Every day | SUBCUTANEOUS | 1 refills | Status: DC

## 2022-01-10 NOTE — Telephone Encounter (Signed)
Spoke with patient regarding home INR 2.3. Will continue the same dose of warfarin '3mg'$  on Monday and 6 mg rest of days. Will have her repeat home INR in 2 weeks. The patient verbalized understanding. ?

## 2022-01-21 NOTE — Progress Notes (Signed)
Subjective:  Patient ID: Pamela [name], female    DOB: May 23, 1946  Age: 76 y.o. MRN: [id number]  Chief Complaint  Patient presents with   Diabetes   Hyperlipidemia    Complains of congestion and cough ongoing for three weeks. Right eye was matting during night but has cleared.   Diabetes:  Complications: CKD 3b.  Glucose checking: before meals and before bed. Glucose logs: Range 69-300.  Sugars have been fluctuating at no certain time.  Hypoglycemia: yes Most recent A1C: 7.4 Current medications: Novolog Pen injector 4-10 units three times day with meals, Toujeo 15 units at bed time. SSI 90-150 4 U 151-250 +1 251-300 +2 301-350 +3 351- 400 +4 >400 +5 Last Eye Exam: unsure when last one was.  Foot checks: daily  Hyperlipidemia: Current medications: Atorvastatin '40mg'$  1 tablet once daily.  Hypertension: Complications: CKD 3a. Current medications: Amlodipine '5mg'$  1 tablet once daily. Lisinopril 20 mg 2 twice a day. Imdur 30 mg once daily, furosemide lasix 40 mg daily  Urge incontinence: Logan Bores works.   Longstanding persistent ATRIAL FIBRILLATION/Protein S deficiency: on coumadin. Coumadin managed by hematology.  Diet: unhealthy due to loss of Brother  Exercise: a little, but left foot is injured.    Unable to tolerate Rybelsus. Vision became blurry. Scheduled to see Endocrinology on 8/7.   CIC: on miralax and sennakot.  Recurrent UTI: on trimpex 100 mg daily  HPI reviewed and updated.   Current Outpatient Medications on File Prior to Visit  Medication Sig Dispense Refill   albuterol (VENTOLIN HFA) 108 (90 Base) MCG/ACT inhaler Inhale 1-2 puffs into the lungs every 6 (six) hours as needed for wheezing or shortness of breath. 1 each 2   amLODipine (NORVASC) 5 MG tablet TAKE 1 TABLET(5 MG) BY MOUTH DAILY (Patient taking differently: 10 mg.) 90 tablet 1   ASPIRIN LOW DOSE 81 MG EC tablet TAKE 1 TABLET(81 MG) BY MOUTH DAILY 90 tablet 3   atorvastatin  (LIPITOR) 40 MG tablet Take 1 tablet (40 mg total) by mouth daily at 12 noon. 90 tablet 1   azelastine (ASTELIN) 0.1 % nasal spray Place 2 sprays into both nostrils daily as needed for rhinitis or allergies. 30 mL 6   B Complex-C (B-COMPLEX WITH VITAMIN C) tablet Take 1 tablet by mouth daily with lunch.     Calcium Citrate-Vitamin D (CALCIUM CITRATE + PO) Take by mouth.     Cholecalciferol (VITAMIN D3) 50 MCG (2000 UT) TABS Take 4,000 Units by mouth daily with lunch.     Continuous Blood Gluc Receiver (FREESTYLE LIBRE 2 READER) DEVI 1 each by Does not apply route 4 (four) times daily - after meals and at bedtime. E11.40 1 each 0   Continuous Blood Gluc Sensor (FREESTYLE LIBRE 2 SENSOR) MISC 2 each by Does not apply route every 14 (fourteen) days. E11.40 6 each 3   estradiol (ESTRACE) 0.1 MG/GM vaginal cream Place 1 Applicatorful vaginally 2 (two) times a week. Uses twice weekly     ferrous sulfate 325 (65 FE) MG tablet Take 325 mg by mouth daily with lunch.     furosemide (LASIX) 40 MG tablet Take 1 tablet (40 mg total) by mouth daily. 90 tablet 3   GEMTESA 75 MG TABS Take by mouth.     Insulin Aspart FlexPen (NOVOLOG) 100 UNIT/ML Inject 2-8 Units into the skin 3 (three) times daily before meals. 15 mL 3   insulin glargine, 1 Unit Dial, (TOUJEO) 300 UNIT/ML Solostar Pen Inject 10  Units into the skin at bedtime. 1.5 mL 1   Insulin Pen Needle (BD PEN NEEDLE NANO 2ND GEN) 32G X 4 MM MISC USE DAILY AS DIRECTED 100 each 3   ipratropium-albuterol (DUONEB) 0.5-2.5 (3) MG/3ML SOLN Take 3 mLs by nebulization 4 (four) times daily as needed (wheezing/shortness of breath).  0   isosorbide mononitrate (IMDUR) 30 MG 24 hr tablet Take 1 tablet (30 mg total) by mouth daily. 90 tablet 3   lisinopril (ZESTRIL) 20 MG tablet TAKE 2 TABLETS(40 MG) BY MOUTH TWICE DAILY 180 tablet 1   magnesium oxide (MAG-OX) 400 (240 Mg) MG tablet TAKE 2 TABLETS(800 MG) BY MOUTH TWICE DAILY (Patient taking differently: Take 800 mg by  mouth 2 (two) times daily.) 120 tablet 2   Multiple Vitamins-Minerals (PRESERVISION AREDS 2 PO) Take by mouth.     ondansetron (ZOFRAN-ODT) 8 MG disintegrating tablet Take 8 mg by mouth every 8 (eight) hours as needed for nausea or vomiting.      pantoprazole (PROTONIX) 40 MG tablet Take 1 tablet (40 mg total) by mouth every morning. 90 tablet 3   polyethylene glycol (MIRALAX / GLYCOLAX) 17 g packet Take 17 g by mouth as needed. Uses 1-2 times weekly     sodium chloride (OCEAN) 0.65 % SOLN nasal spray Place 1-2 sprays into both nostrils 4 (four) times daily as needed for congestion.     Vibegron (GEMTESA PO) Take 75 mg by mouth at bedtime.     warfarin (COUMADIN) 5 MG tablet Take 5 mg by mouth as directed. On Tues, Thur, Sat, and Sunday     warfarin (COUMADIN) 6 MG tablet Take 6 mg by mouth as directed. Daily     Semaglutide (RYBELSUS) 7 MG TABS Take 7 mg by mouth daily. (Patient not taking: Reported on 01/24/2022) 90 tablet 0   Semaglutide,0.25 or 0.'5MG'$ /DOS, 2 MG/1.5ML SOPN Inject 0.25 mg into the skin once a week. (Patient not taking: Reported on 01/24/2022) 1.5 mL 0   No current facility-administered medications on file prior to visit.   Past Medical History:  Diagnosis Date   Anticoagulated on Coumadin    chronic--- managed by hematology/ oncology,   Asthma, mild intermittent    followed by pcp--- per pt last exacerbation winter 2021 w/ acute bronchitis   Bilateral leg cramps    Chronic constipation    CKD (chronic kidney disease), stage III ([place])    Closed bimalleolar fracture of left ankle 02/26/2020   DDD (degenerative disc disease), cervical    w/ spondylosis,  per pt last steroid injection 06/ 2022   DDD (degenerative disc disease), lumbosacral    DVT (deep venous thrombosis) ([place]) 07/30/2013   Dyspnea    occasionally   GERD (gastroesophageal reflux disease)    History of cardiac murmur as a child    History of DVT of lower extremity    left lower extremity in 1980s, fell when  bowling   History of pulmonary embolus (PE) 1993   per pt left lung post op 2 wks cholecystectomy   History of rheumatic fever as a child    per last echo 12-31-2019 no valvular issues   History of TIA (transient ischemic attack)    2014 and 2018 or 2019,  per pt no residual   HTN (hypertension)    followed by pcp   Hyperlipidemia    IDA (iron deficiency anemia)    Macular degeneration of both eyes    Mild obstructive sleep apnea    per pt  dx 2017 tried to uses cpap but intolerant   Mixed incontinence urge and stress    urologist--- dr Nila Nephew   OA (osteoarthritis) 07/06/2018   Osteoporosis    PAF (paroxysmal atrial fibrillation) ([place]) 10/01/2014   cardiologist--- dr Godfrey Pick tobb;   cardiac cath 02-18-2013 normal coronaries arteries, ef 50%, cath done since echo showed ef 30-35%; nucleat stress study 04/ 2020 normal , normal echo 04/ 2021,  event monitor 09-14-2020 rare ST/AT variable block   PONV (postoperative nausea and vomiting)    Protein S deficiency ([place])    followed by hemotology/ oncology-- dr d. Bobby Rumpf ([facility]) dx 1980s;  prior DVT left lower leg 1980s and left lung PE 1993; chronic  coumadin since 1980s   PVC's (premature ventricular contractions)    followed by cardiology   S/P cardiac catheterization 02/2013   Normal coronaries; low normal EF at 50%   Solitary pulmonary nodule on lung CT 02/06/2019   First noted 01/13/2014 > no change as of 12/21/2018   Spondylolisthesis, lumbar region 08/09/2018   Stroke ([place])    TIA in 2018 or 2019   Transient ischemic attack 07/30/2013   Type 2 diabetes mellitus treated with insulin ([place])    endocrilogist--- whitney reardon NP     (03-10-2021  pt continuously checks blood sugar throughout the day w/ Libre, fasting sugar --- 69--200)   Wears glasses    Wears hearing aid in both ears    Past Surgical History:  Procedure Laterality Date   ABDOMINAL HYSTERECTOMY     BLEPHAROPLASTY     CARDIAC CATHETERIZATION   02/18/2013   @ [place]  by Dr Martinique;  normal coronaries w/ preserved LVF, ef 50%;   previous cath 2001 normal ef 65%   CARPAL TUNNEL RELEASE Bilateral 1994   CATARACT EXTRACTION W/ INTRAOCULAR LENS IMPLANT Bilateral 2017   CHOLECYSTECTOMY, LAPAROSCOPIC  1993   COLONOSCOPY     EAR BIOPSY Left    FINGER SURGERY Left 2018   thumb   FOOT SURGERY     FOOT TENDON SURGERY Right    early 2000s   HAND SURGERY     HARDWARE REMOVAL Left 03/12/2021   Procedure: HARDWARE REMOVAL;  Surgeon: Nicholes Stairs, MD;  Location: [facility];  Service: Orthopedics;  Laterality: Left;  60 MINS   ORIF ANKLE FRACTURE Left 02/26/2020   Procedure: OPEN REDUCTION INTERNAL FIXATION (ORIF) ANKLE FRACTURE;  Surgeon: Nicholes Stairs, MD;  Location: [facility];  Service: Orthopedics;  Laterality: Left;  90 mins   TONSILLECTOMY AND ADENOIDECTOMY Bilateral    TOTAL KNEE ARTHROPLASTY Left 03/2005   TOTAL VAGINAL HYSTERECTOMY  1988   per pt still has ovaries    Family History  Problem Relation Age of Onset   Cirrhosis Mother    Antithrombin III deficiency Mother        multiple emboli   Ulcerative colitis Mother    Diabetes Father    Coronary artery disease Father    Heart attack Father    Hypertension Father    Kidney disease Father    Hypertension Sister    Diabetes Sister    Heart attack Sister        age 65    Heart attack Brother    Hypertension Brother    Lung cancer Brother    Protein S deficiency Daughter    Stroke Neg Hx    Colitis Neg Hx    Colon polyps Neg Hx    Esophageal cancer  Neg Hx    Liver cancer Neg Hx    Pancreatic cancer Neg Hx    Rectal cancer Neg Hx    Stomach cancer Neg Hx    Social History   Socioeconomic History   Marital status: Married    Spouse name: [name]   Number of children: Not on file   Years of education: Not on file   Highest education level: Not on file  Occupational History   Not on file  Tobacco Use   Smoking status: Never   Smokeless  tobacco: Never  Vaping Use   Vaping Use: Never used  Substance and Sexual Activity   Alcohol use: No   Drug use: Never   Sexual activity: Not on file  Other Topics Concern   Not on file  Social History Narrative   Lives with spouse in [place].  2 grown daughters.   Retired Glass blower/designer     Social Determinants of Radio broadcast assistant Strain: Not on file  Food Insecurity: Not on file  Transportation Needs: Not on file  Physical Activity: Not on file  Stress: Not on file  Social Connections: Not on file    Review of Systems  Constitutional:  Negative for appetite change, fatigue and fever.  HENT:  Positive for congestion and rhinorrhea. Negative for ear pain, sinus pressure and sore throat.   Respiratory:  Positive for cough. Negative for chest tightness, shortness of breath and wheezing.   Cardiovascular:  Negative for chest pain and palpitations.  Gastrointestinal:  Positive for diarrhea. Negative for abdominal pain, constipation, nausea and vomiting.  Endocrine: Positive for polydipsia.  Genitourinary:  Negative for dysuria and hematuria.  Musculoskeletal:  Positive for myalgias (lower leg and feet cramping). Negative for arthralgias, back pain and joint swelling.       Healing avulsive FX left 5th metatarsal.   Skin:  Negative for rash.  Neurological:  Positive for headaches. Negative for dizziness and weakness.  Psychiatric/Behavioral:  Negative for dysphoric mood. The patient is not nervous/anxious.     Objective:  BP 128/60   Pulse 76   Temp 97.7 F (36.5 C)   Resp 14   Ht '5\' 1"'$  (1.549 m)   Wt 177 lb 3.2 oz (80.4 kg)   BMI 33.48 kg/m      01/24/2022    8:09 AM 12/28/2021    9:09 AM 12/28/2021    9:06 AM  BP/Weight  Systolic BP 676 720 947  Diastolic BP 60 57 66  Wt. (Lbs) 177.2  180  BMI 33.48 kg/m2  35.15 kg/m2    Physical Exam Vitals reviewed.  Constitutional:      Appearance: Normal appearance. She is normal weight.  HENT:     Nose:  Septal deviation present.     Right Turbinates: Swollen (ERYTHEMA, SCABBED BLOOD).     Left Turbinates: Swollen.     Right Sinus: Maxillary sinus tenderness present.     Left Sinus: Maxillary sinus tenderness present.  Cardiovascular:     Rate and Rhythm: Normal rate and regular rhythm.     Pulses: Normal pulses.     Heart sounds: Normal heart sounds.  Pulmonary:     Effort: Pulmonary effort is normal.     Breath sounds: Normal breath sounds.  Abdominal:     General: Abdomen is flat. Bowel sounds are normal.     Palpations: Abdomen is soft.  Neurological:     Mental Status: She is alert and oriented to person, place, and  time.  Psychiatric:        Mood and Affect: Mood normal.        Behavior: Behavior normal.    Diabetic Foot Exam - Simple   Simple Foot Form Diabetic Foot exam was performed with the following findings: Yes 01/24/2022  8:54 AM  Visual Inspection See comments: Yes Sensation Testing See comments: Yes Pulse Check See comments: Yes Comments Simple foot exam normal on right foot. Left foot unable to be evaluated due to injury.       Lab Results  Component Value Date   WBC 8.5 10/18/2021   HGB 13.0 10/18/2021   HCT 38.7 10/18/2021   PLT 289 10/18/2021   GLUCOSE 79 10/18/2021   CHOL 144 10/18/2021   TRIG 64 10/18/2021   HDL 72 10/18/2021   LDLCALC 59 10/18/2021   ALT 18 10/18/2021   AST 24 10/18/2021   NA 138 10/18/2021   K 3.9 10/18/2021   CL 97 10/18/2021   CREATININE 1.34 (H) 10/18/2021   BUN 26 10/18/2021   CO2 27 10/18/2021   TSH 2.510 10/18/2021   INR 2.60 (A) 12/20/2021   HGBA1C 7.4 (H) 10/18/2021   MICROALBUR 10 06/15/2021      Assessment & Plan:   Problem List Items Addressed This Visit       Cardiovascular and Mediastinum   Atrial fibrillation [I48.91]    Rate controlled.  Continue Coumadin.  Management per cardiology.       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 Continue Lisinopril 20 mg 2 twice a day. Imdur 30 mg once daily, furosemide lasix 40 mg daily         Respiratory   Acute infection of nasal sinus    Cefdinir 300 mg 2 daily x10 days.       Relevant Medications   cefdinir (OMNICEF) 300 MG capsule     Endocrine   Type 2 diabetes mellitus with diabetic neuropathy, with long-term current use of insulin (HCC) - Primary (Chronic)    Control: Improved Await labs/testing for assessment and recommendations. Recommend check sugars fasting daily. Recommend check feet daily. Recommend annual eye exams. Medicines: Novolog Pen injector 4-10 units three times day with meals, Toujeo 15 units at bed time. SSI 90-150 4 U 151-250 +1 251-300 +2 301-350 +3 351- 400 +4 >400 +5 Continue to work on eating a healthy diet and exercise.  Labs drawn today.         Relevant Orders   Hemoglobin A1c     Musculoskeletal and Integument   Osteopenia    Recommend calcium with vitamin D. Despite bone density showing only osteopenia in October 2022. The presence of her fracture in her foot is pathological.  I would recommend Prolia every 6 months.         Genitourinary   Hypertensive kidney disease with stage 3a chronic kidney disease ([place])    Hypertension well controlled. The current medical regimen is effective;  continue present plan and medications. Continue Amlodipine '5mg'$  1 tablet once daily. Lisinopril 20 mg 2 twice a day. Imdur 30 mg once daily, furosemide lasix 40 mg daily.       Relevant Orders   CBC With Diff/Platelet   Comprehensive metabolic panel     Hematopoietic and Hemostatic   Protein S deficiency ([place])    Followed by hematology.  Hematologist manages Coumadin.  Acquired thrombophilia ([place])    Currently on Coumadin.  Necessary due to protein S deficiency and atrial fibrillation.         Other   Mixed hyperlipidemia    Well controlled.  No changes to  medicines.  Atorvastatin '40mg'$  1 tablet once daily. Continue to work on eating a healthy diet and exercise.  Labs drawn today.        Relevant Orders   CBC With Diff/Platelet   Lipid panel   Class 1 obesity due to excess calories with serious comorbidity and body mass index (BMI) of 33.0 to 33.9 in adult    Recommend healthy diet and exercise.       RESOLVED: Memory loss  .  Meds ordered this encounter  Medications   cefdinir (OMNICEF) 300 MG capsule    Sig: Take 1 capsule (300 mg total) by mouth 2 (two) times daily.    Dispense:  20 capsule    Refill:  0    Orders Placed This Encounter  Procedures   CBC With Diff/Platelet   Comprehensive metabolic panel   Lipid panel   Hemoglobin A1c     Follow-up: Return in about 3 months (around 04/26/2022) for FASTING.  An After Visit Summary was printed and given to the patient.     I,Lauren M Auman,acting as a scribe for [name], MD.,have documented all relevant documentation on the behalf of [name], MD,as directed by  [name], MD while in the presence of [name], MD.    [name], MD [place] [phone]

## 2022-01-24 ENCOUNTER — Ambulatory Visit (INDEPENDENT_AMBULATORY_CARE_PROVIDER_SITE_OTHER): Payer: Medicare PPO | Admitting: Family Medicine

## 2022-01-24 VITALS — BP 128/60 | HR 76 | Temp 97.7°F | Resp 14 | Ht 61.0 in | Wt 177.2 lb

## 2022-01-24 DIAGNOSIS — Z794 Long term (current) use of insulin: Secondary | ICD-10-CM

## 2022-01-24 DIAGNOSIS — Z6833 Body mass index (BMI) 33.0-33.9, adult: Secondary | ICD-10-CM

## 2022-01-24 DIAGNOSIS — R413 Other amnesia: Secondary | ICD-10-CM | POA: Diagnosis not present

## 2022-01-24 DIAGNOSIS — E1140 Type 2 diabetes mellitus with diabetic neuropathy, unspecified: Secondary | ICD-10-CM | POA: Diagnosis not present

## 2022-01-24 DIAGNOSIS — D6869 Other thrombophilia: Secondary | ICD-10-CM | POA: Diagnosis not present

## 2022-01-24 DIAGNOSIS — I5030 Unspecified diastolic (congestive) heart failure: Secondary | ICD-10-CM

## 2022-01-24 DIAGNOSIS — I129 Hypertensive chronic kidney disease with stage 1 through stage 4 chronic kidney disease, or unspecified chronic kidney disease: Secondary | ICD-10-CM

## 2022-01-24 DIAGNOSIS — E782 Mixed hyperlipidemia: Secondary | ICD-10-CM | POA: Diagnosis not present

## 2022-01-24 DIAGNOSIS — I110 Hypertensive heart disease with heart failure: Secondary | ICD-10-CM

## 2022-01-24 DIAGNOSIS — M8589 Other specified disorders of bone density and structure, multiple sites: Secondary | ICD-10-CM

## 2022-01-24 DIAGNOSIS — N1831 Chronic kidney disease, stage 3a: Secondary | ICD-10-CM

## 2022-01-24 DIAGNOSIS — D6859 Other primary thrombophilia: Secondary | ICD-10-CM | POA: Diagnosis not present

## 2022-01-24 DIAGNOSIS — J019 Acute sinusitis, unspecified: Secondary | ICD-10-CM | POA: Insufficient documentation

## 2022-01-24 DIAGNOSIS — J018 Other acute sinusitis: Secondary | ICD-10-CM

## 2022-01-24 DIAGNOSIS — I4811 Longstanding persistent atrial fibrillation: Secondary | ICD-10-CM | POA: Diagnosis not present

## 2022-01-24 DIAGNOSIS — E6609 Other obesity due to excess calories: Secondary | ICD-10-CM

## 2022-01-24 MED ORDER — CEFDINIR 300 MG PO CAPS: 300.0000 mg | ORAL_CAPSULE | Freq: Two times a day (BID) | ORAL | 0 refills | Status: DC

## 2022-01-24 NOTE — Assessment & Plan Note (Signed)
Recommend calcium with vitamin D. Despite bone density showing only osteopenia in October 2022. The presence of her fracture in her foot is pathological.  I would recommend Prolia every 6 months.

## 2022-01-24 NOTE — Assessment & Plan Note (Signed)
Cefdinir 300 mg 2 daily x10 days.

## 2022-01-24 NOTE — Assessment & Plan Note (Signed)
The current medical regimen is effective;  continue present plan and medications. Continue Lisinopril 20 mg 2 twice a day. Imdur 30 mg once daily, furosemide lasix 40 mg daily

## 2022-01-24 NOTE — Assessment & Plan Note (Addendum)
Hypertension well controlled. The current medical regimen is effective;  continue present plan and medications. Continue Amlodipine '5mg'$  1 tablet once daily. Lisinopril 20 mg 2 twice a day. Imdur 30 mg once daily, furosemide lasix 40 mg daily.

## 2022-01-24 NOTE — Assessment & Plan Note (Signed)
Currently on Coumadin.  Necessary due to protein S deficiency and atrial fibrillation.

## 2022-01-24 NOTE — Assessment & Plan Note (Addendum)
Control: Improved Await labs/testing for assessment and recommendations. Recommend check sugars fasting daily. Recommend check feet daily. Recommend annual eye exams. Medicines: Novolog Pen injector 4-10 units three times day with meals, Toujeo 15 units at bed time. SSI 90-150 4 U 151-250 +1 251-300 +2 301-350 +3 351- 400 +4 >400 +5 Continue to work on eating a healthy diet and exercise.  Labs drawn today.

## 2022-01-24 NOTE — Assessment & Plan Note (Signed)
Rate controlled.  Continue Coumadin.  Management per cardiology.

## 2022-01-24 NOTE — Assessment & Plan Note (Signed)
Recommend healthy diet and exercise. 

## 2022-01-24 NOTE — Assessment & Plan Note (Signed)
Followed by hematology.  Hematologist manages Coumadin.

## 2022-01-24 NOTE — Assessment & Plan Note (Addendum)
Well controlled.  No changes to medicines.  Atorvastatin '40mg'$  1 tablet once daily. Continue to work on eating a healthy diet and exercise.  Labs drawn today.

## 2022-01-25 ENCOUNTER — Other Ambulatory Visit: Payer: Self-pay

## 2022-01-25 ENCOUNTER — Other Ambulatory Visit: Payer: Self-pay | Admitting: Family Medicine

## 2022-01-25 ENCOUNTER — Telehealth: Payer: Self-pay

## 2022-01-25 DIAGNOSIS — I10 Essential (primary) hypertension: Secondary | ICD-10-CM

## 2022-01-25 LAB — COMPREHENSIVE METABOLIC PANEL
ALT: 19 IU/L (ref 0–32)
AST: 22 IU/L (ref 0–40)
Albumin/Globulin Ratio: 1.3 (ref 1.2–2.2)
Albumin: 3.9 g/dL (ref 3.7–4.7)
Alkaline Phosphatase: 79 IU/L (ref 44–121)
BUN/Creatinine Ratio: 21 (ref 12–28)
BUN: 26 mg/dL (ref 8–27)
Bilirubin Total: 0.3 mg/dL (ref 0.0–1.2)
CO2: 27 mmol/L (ref 20–29)
Calcium: 9.5 mg/dL (ref 8.7–10.3)
Chloride: 93 mmol/L — ABNORMAL LOW (ref 96–106)
Creatinine, Ser: 1.24 mg/dL — ABNORMAL HIGH (ref 0.57–1.00)
Globulin, Total: 2.9 g/dL (ref 1.5–4.5)
Glucose: 106 mg/dL — ABNORMAL HIGH (ref 70–99)
Potassium: 4.8 mmol/L (ref 3.5–5.2)
Sodium: 134 mmol/L (ref 134–144)
Total Protein: 6.8 g/dL (ref 6.0–8.5)
eGFR: 45 mL/min/{1.73_m2} — ABNORMAL LOW (ref >59)

## 2022-01-25 LAB — CBC WITH DIFF/PLATELET
Basophils Absolute: 0.1 10*3/uL (ref 0.0–0.2)
Basos: 1 %
EOS (ABSOLUTE): 0.1 10*3/uL (ref 0.0–0.4)
Eos: 1 %
Hematocrit: 39.2 % (ref 34.0–46.6)
Hemoglobin: 13.2 g/dL (ref 11.1–15.9)
Immature Grans (Abs): 0.1 10*3/uL (ref 0.0–0.1)
Immature Granulocytes: 1 %
Lymphocytes Absolute: 1.4 10*3/uL (ref 0.7–3.1)
Lymphs: 14 %
MCH: 30.6 pg (ref 26.6–33.0)
MCHC: 33.7 g/dL (ref 31.5–35.7)
MCV: 91 fL (ref 79–97)
Monocytes Absolute: 0.8 10*3/uL (ref 0.1–0.9)
Monocytes: 9 %
Neutrophils Absolute: 7.4 10*3/uL — ABNORMAL HIGH (ref 1.4–7.0)
Neutrophils: 74 %
Platelets: 371 10*3/uL (ref 150–450)
RBC: 4.32 x10E6/uL (ref 3.77–5.28)
RDW: 11.9 % (ref 11.7–15.4)
WBC: 9.8 10*3/uL (ref 3.4–10.8)

## 2022-01-25 LAB — LIPID PANEL
Chol/HDL Ratio: 3.2 ratio (ref 0.0–4.4)
Cholesterol, Total: 186 mg/dL (ref 100–199)
HDL: 59 mg/dL (ref >39)
LDL Chol Calc (NIH): 111 mg/dL — ABNORMAL HIGH (ref 0–99)
Triglycerides: 87 mg/dL (ref 0–149)
VLDL Cholesterol Cal: 16 mg/dL (ref 5–40)

## 2022-01-25 LAB — HEMOGLOBIN A1C
Est. average glucose Bld gHb Est-mCnc: 160 mg/dL
Hgb A1c MFr Bld: 7.2 % — ABNORMAL HIGH (ref 4.8–5.6)

## 2022-01-25 LAB — CARDIOVASCULAR RISK ASSESSMENT

## 2022-01-25 MED ORDER — ATORVASTATIN CALCIUM 80 MG PO TABS: 80.0000 mg | ORAL_TABLET | Freq: Every day | ORAL | 0 refills | Status: DC

## 2022-01-25 NOTE — Telephone Encounter (Signed)
Please advise 

## 2022-01-26 ENCOUNTER — Other Ambulatory Visit: Payer: Self-pay | Admitting: Hematology and Oncology

## 2022-01-26 ENCOUNTER — Telehealth: Payer: Self-pay | Admitting: Hematology and Oncology

## 2022-01-26 DIAGNOSIS — S92354A Nondisplaced fracture of fifth metatarsal bone, right foot, initial encounter for closed fracture: Secondary | ICD-10-CM | POA: Diagnosis not present

## 2022-01-26 DIAGNOSIS — S92344G Nondisplaced fracture of fourth metatarsal bone, right foot, subsequent encounter for fracture with delayed healing: Secondary | ICD-10-CM | POA: Diagnosis not present

## 2022-01-26 NOTE — Telephone Encounter (Signed)
Spoke with patient regarding home INR 2.3.  She continues warfarin 3 mg on Tuesday and 6 mg the rest of days.  She will continue the same warfarin dose.  She will repeat a home INR on June 5.  The patient expressed understanding.

## 2022-02-02 ENCOUNTER — Other Ambulatory Visit: Payer: Self-pay | Admitting: Oncology

## 2022-02-02 DIAGNOSIS — D6859 Other primary thrombophilia: Secondary | ICD-10-CM

## 2022-02-02 NOTE — Progress Notes (Signed)
[facility]  [address] [place],  [place]  [address] [phone]  Clinic Day:  02/03/2022  Referring physician: [name], MD   HISTORY OF PRESENT ILLNESS:  The patient is a 76 y.o. female who I follow for mild anemia.  As her hemoglobin has never been particularly low, it has been followed conservatively.  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  She comes in today for annual follow-up.  Since her last visit, the patient has been doing fairly well.  She denies having increased fatigue or any overt forms of blood loss which concern her for progressive anemia.  She also claims to be therapeutic on her current Coumadin dose.  She denies having symptoms which suggest she has had any recent blood clots.   PHYSICAL EXAM:  Blood pressure (!) 188/79, pulse 81, temperature 98.2 F (36.8 C), resp. rate 14, height 5' (1.524 m), weight 175 lb 9.6 oz (79.7 kg), SpO2 97 %. Wt Readings from Last 3 Encounters:  02/03/22 175 lb 9.6 oz (79.7 kg)  01/24/22 177 lb 3.2 oz (80.4 kg)  12/28/21 180 lb (81.6 kg)   Body mass index is 34.29 kg/m. Performance status (ECOG): 1 - Symptomatic but completely ambulatory Physical Exam Constitutional:      Appearance: Normal appearance. She is not ill-appearing.  HENT:     Mouth/Throat:     Mouth: Mucous membranes are moist.     Pharynx: Oropharynx is clear. No oropharyngeal exudate or posterior oropharyngeal erythema.  Cardiovascular:     Rate and Rhythm: Normal rate and regular rhythm.     Heart sounds: No murmur heard.   No friction rub. No gallop.  Pulmonary:     Effort: Pulmonary effort is normal. No respiratory distress.     Breath sounds: Normal breath sounds. No wheezing, rhonchi or rales.  Abdominal:     General: Bowel sounds are normal. There is no distension.     Palpations: Abdomen is soft. There is no mass.     Tenderness: There  is no abdominal tenderness.  Musculoskeletal:        General: No swelling.     Right lower leg: No edema.     Left lower leg: No edema.  Lymphadenopathy:     Cervical: No cervical adenopathy.     Upper Body:     Right upper body: No supraclavicular or axillary adenopathy.     Left upper body: No supraclavicular or axillary adenopathy.     Lower Body: No right inguinal adenopathy. No left inguinal adenopathy.  Skin:    General: Skin is warm.     Coloration: Skin is not jaundiced.     Findings: No lesion or rash.  Neurological:     General: No focal deficit present.     Mental Status: She is alert and oriented to person, place, and time. Mental status is at baseline.  Psychiatric:        Mood and Affect: Mood normal.        Behavior: Behavior normal.        Thought Content: Thought content normal.    LABS:      Latest Ref Rng & Units 02/03/2022   12:00 AM 01/24/2022    9:05 AM 10/18/2021    8:55 AM  CBC  WBC  6.9      9.8   8.5    Hemoglobin 12.0 -  16.0 13.0      13.2   13.0    Hematocrit 36 - 46 40      39.2   38.7    Platelets 150 - 400 K/uL 311      371   289       This result is from an external source.      Latest Ref Rng & Units 02/03/2022   12:00 AM 01/24/2022    9:05 AM 10/18/2021    8:55 AM  CMP  Glucose 70 - 99 mg/dL  106   79    BUN 4 - '21 18      26   26    '$ Creatinine 0.5 - 1.1 1.0      1.24   1.34    Sodium 137 - 147 138      134   138    Potassium 3.5 - 5.1 mEq/L 4.6      4.8   3.9    Chloride 99 - 108 100      93   97    CO2 13 - '22 31      27   27    '$ Calcium 8.7 - 10.7 8.9      9.5   9.5    Total Protein 6.0 - 8.5 g/dL  6.8   6.8    Total Bilirubin 0.0 - 1.2 mg/dL  0.3   0.4    Alkaline Phos 25 - 125 63      79   82    AST 13 - 35 '31      22   24    '$ ALT 7 - 35 U/L '25      19   18       '$ This result is from an external source.   Lab Results  Component Value Date   TIBC 315 02/03/2022   FERRITIN 92 02/03/2022   IRONPCTSAT 24 02/03/2022     ASSESSMENT & PLAN:  Assessment/Plan:  A 76 y.o. female with protein S deficiency, as well as a history of mild anemia.  I am very pleased with her counts today as her hemoglobin is ideal.  Furthermore, her iron parameters are normal.  As mentioned previously, she remains on her home Coumadin program which has her INR consistently at a therapeutic level.  Clinically, she continues to do very well.  Per her request, I will see her back in 1 year for repeat clinical assessment.  The patient understands all the plans discussed today and is in agreement with them.    Yader Criger Macarthur Critchley, MD

## 2022-02-03 ENCOUNTER — Inpatient Hospital Stay (INDEPENDENT_AMBULATORY_CARE_PROVIDER_SITE_OTHER): Payer: Medicare PPO | Admitting: Oncology

## 2022-02-03 ENCOUNTER — Inpatient Hospital Stay: Payer: Medicare PPO | Attending: Oncology

## 2022-02-03 ENCOUNTER — Telehealth: Payer: Self-pay | Admitting: Oncology

## 2022-02-03 ENCOUNTER — Other Ambulatory Visit: Payer: Self-pay | Admitting: Oncology

## 2022-02-03 VITALS — BP 188/79 | HR 81 | Temp 98.2°F | Resp 14 | Ht 60.0 in | Wt 175.6 lb

## 2022-02-03 DIAGNOSIS — D631 Anemia in chronic kidney disease: Secondary | ICD-10-CM

## 2022-02-03 DIAGNOSIS — D6859 Other primary thrombophilia: Secondary | ICD-10-CM

## 2022-02-03 DIAGNOSIS — D649 Anemia, unspecified: Secondary | ICD-10-CM | POA: Insufficient documentation

## 2022-02-03 DIAGNOSIS — N189 Chronic kidney disease, unspecified: Secondary | ICD-10-CM | POA: Diagnosis not present

## 2022-02-03 LAB — IRON AND TIBC
Iron: 76 ug/dL (ref 28–170)
Saturation Ratios: 24 % (ref 10.4–31.8)
TIBC: 315 ug/dL (ref 250–450)
UIBC: 239 ug/dL

## 2022-02-03 LAB — BASIC METABOLIC PANEL
BUN: 18 (ref 4–21)
CO2: 31 — AB (ref 13–22)
Chloride: 100 (ref 99–108)
Creatinine: 1 (ref 0.5–1.1)
Glucose: 183
Potassium: 4.6 mEq/L (ref 3.5–5.1)
Sodium: 138 (ref 137–147)

## 2022-02-03 LAB — HEPATIC FUNCTION PANEL
ALT: 25 U/L (ref 7–35)
AST: 31 (ref 13–35)
Alkaline Phosphatase: 63 (ref 25–125)
Bilirubin, Total: 0.5

## 2022-02-03 LAB — PROTIME-INR
INR: 2.9 — ABNORMAL HIGH (ref 0.8–1.2)
Prothrombin Time: 29.8 seconds — ABNORMAL HIGH (ref 11.4–15.2)

## 2022-02-03 LAB — CBC AND DIFFERENTIAL
HCT: 40 (ref 36–46)
Hemoglobin: 13 (ref 12.0–16.0)
Neutrophils Absolute: 5.04
Platelets: 311 10*3/uL (ref 150–400)
WBC: 6.9

## 2022-02-03 LAB — FERRITIN: Ferritin: 92 ng/mL (ref 11–307)

## 2022-02-03 LAB — COMPREHENSIVE METABOLIC PANEL
Albumin: 4.2 (ref 3.5–5.0)
Calcium: 8.9 (ref 8.7–10.7)

## 2022-02-03 LAB — CBC: RBC: 4.32 (ref 3.87–5.11)

## 2022-02-03 NOTE — Telephone Encounter (Signed)
Per 02/03/22 los next appt scheduled and confirmed with patient

## 2022-02-04 ENCOUNTER — Other Ambulatory Visit: Payer: Self-pay | Admitting: Physician Assistant

## 2022-02-04 ENCOUNTER — Telehealth: Payer: Self-pay

## 2022-02-04 ENCOUNTER — Other Ambulatory Visit: Payer: Self-pay | Admitting: Hematology and Oncology

## 2022-02-04 DIAGNOSIS — D6859 Other primary thrombophilia: Secondary | ICD-10-CM

## 2022-02-04 DIAGNOSIS — Z7901 Long term (current) use of anticoagulants: Secondary | ICD-10-CM

## 2022-02-04 DIAGNOSIS — J060 Acute laryngopharyngitis: Secondary | ICD-10-CM

## 2022-02-04 NOTE — Progress Notes (Signed)
Chronic Care Management Pharmacy Assistant   Name: Pamela [name]  MRN: [id number] DOB: June 09, 1946   Reason for Encounter: Disease State call for DM   Recent office visits:  01/24/22 [name] MD. Seen for routine visit. D/C Carvedilol, Hydrocodone-APAP, Methocarbamol '750mg'$ , Pregabalin '75mg'$ , Docusate Sodium.  12/30/21 [name] MD. Orders Only. Patient unable to find ozempic in stock. Dr [name] recommended Rybelsus 7 mg be sent as [place].   12/29/21 Jackie Plum CMA. Orders Only. Increased Ozempic to 0.'25mg'$  weekly.  11/16/21 [name] MD. Seen for Osteopenia. Administered Prolia '60mg'$ .   10/18/21 [name] MD. Seen for routine visit. D/C Budesonide Inhaler and Pregabalin '75mg'$ .   09/10/21 Marge Duncans PA-C. Seen for Acute Laryngopharyngitis. Started on Ciprofloxacin HCI '250mg'$ .   08/31/22 Meredith Mody RN. Recommend increase amlodipine to 10 mg daily.   Recent consult visits:  02/03/22 (Oncology) Lavera Guise MD. Seen for Anemia in CKD. No med changes.  12/28/21 (Rheumatology) Vernelle Emerald MD. Initial Visit. No med changes.  12/20/21 (Hematology/Oncology) Rosanne Sack PA-C. Coumadin was increased to '6mg'$  daily 2 weeks ago.  12/09/21 (Endocrinology) Rayetta Pigg NP. Seen for routine visit. She is advised to lower her Toujeo to 10 units SQ nightly and continue her Novolot 2-8 units TID with meals if glucose is above 90 and she is eating (Specific instructions on how to titrate insulin dosage based on glucose readings given to patient   09/07/21 (Oncology) Rosanne Sack PA-C. I will have her switch to Coumadin 5 mg on Wednesday and Sunday and 6 mg the rest of the days  Hospital visits:  None  Medications: Outpatient Encounter Medications as of 02/04/2022  Medication Sig   albuterol (VENTOLIN HFA) 108 (90 Base) MCG/ACT inhaler Inhale 1-2 puffs into the lungs every 6 (six) hours as needed for wheezing or shortness of breath.    amLODipine (NORVASC) 5 MG tablet TAKE 1 TABLET(5 MG) BY MOUTH DAILY (Patient taking differently: 10 mg.)   ASPIRIN LOW DOSE 81 MG EC tablet TAKE 1 TABLET(81 MG) BY MOUTH DAILY   atorvastatin (LIPITOR) 80 MG tablet Take 1 tablet (80 mg total) by mouth daily.   azelastine (ASTELIN) 0.1 % nasal spray Place 2 sprays into both nostrils daily as needed for rhinitis or allergies.   B Complex-C (B-COMPLEX WITH VITAMIN C) tablet Take 1 tablet by mouth daily with lunch.   Calcium Citrate-Vitamin D (CALCIUM CITRATE + PO) Take by mouth.   cefdinir (OMNICEF) 300 MG capsule Take 1 capsule (300 mg total) by mouth 2 (two) times daily.   Cholecalciferol (VITAMIN D3) 50 MCG (2000 UT) TABS Take 4,000 Units by mouth daily with lunch.   Continuous Blood Gluc Receiver (FREESTYLE LIBRE 2 READER) DEVI 1 each by Does not apply route 4 (four) times daily - after meals and at bedtime. E11.40   Continuous Blood Gluc Sensor (FREESTYLE LIBRE 2 SENSOR) MISC 2 each by Does not apply route every 14 (fourteen) days. 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 Uses twice weekly   ferrous sulfate 325 (65 FE) MG tablet Take 325 mg by mouth daily with lunch.   furosemide (LASIX) 40 MG tablet Take 1 tablet (40 mg total) by mouth daily.   GEMTESA 75 MG TABS Take by mouth.   Insulin Aspart FlexPen (NOVOLOG) 100 UNIT/ML Inject 2-8 Units into the skin 3 (three) times daily before meals.   insulin glargine, 1 Unit  Dial, (TOUJEO) 300 UNIT/ML Solostar Pen Inject 10 Units into the skin at bedtime.   Insulin Pen Needle (BD PEN NEEDLE NANO 2ND GEN) 32G X 4 MM MISC USE DAILY AS DIRECTED   ipratropium-albuterol (DUONEB) 0.5-2.5 (3) MG/3ML SOLN Take 3 mLs by nebulization 4 (four) times daily as needed (wheezing/shortness of breath).   isosorbide mononitrate (IMDUR) 30 MG 24 hr tablet Take 1 tablet (30 mg total) by mouth daily.   lisinopril (ZESTRIL) 20 MG tablet TAKE 2 TABLETS(40 MG) BY  MOUTH TWICE DAILY   magnesium oxide (MAG-OX) 400 (240 Mg) MG tablet TAKE 2 TABLETS(800 MG) BY MOUTH TWICE DAILY (Patient taking differently: Take 800 mg by mouth 2 (two) times daily.)   Multiple Vitamins-Minerals (PRESERVISION AREDS 2 PO) Take by mouth.   ondansetron (ZOFRAN-ODT) 8 MG disintegrating tablet Take 8 mg by mouth every 8 (eight) hours as needed for nausea or vomiting.    pantoprazole (PROTONIX) 40 MG tablet Take 1 tablet (40 mg total) by mouth every morning.   polyethylene glycol (MIRALAX / GLYCOLAX) 17 g packet Take 17 g by mouth as needed. Uses 1-2 times weekly   Semaglutide (RYBELSUS) 7 MG TABS Take 7 mg by mouth daily. (Patient not taking: Reported on 01/24/2022)   Semaglutide,0.25 or 0.'5MG'$ /DOS, 2 MG/1.5ML SOPN Inject 0.25 mg into the skin once a week. (Patient not taking: Reported on 01/24/2022)   sodium chloride (OCEAN) 0.65 % SOLN nasal spray Place 1-2 sprays into both nostrils 4 (four) times daily as needed for congestion.   Vibegron (GEMTESA PO) Take 75 mg by mouth at bedtime.   warfarin (COUMADIN) 5 MG tablet TAKE 1 TABLET BY MOUTH EVERY DAY   warfarin (COUMADIN) 5 MG tablet Take 5 mg by mouth as directed. On Tues, Thur, Sat, and Sunday   warfarin (COUMADIN) 6 MG tablet Take 6 mg by mouth as directed. Daily   No facility-administered encounter medications on file as of 02/04/2022.    Recent Relevant Labs: Lab Results  Component Value Date/Time   HGBA1C 7.2 (H) 01/24/2022 09:05 AM   HGBA1C 7.4 (H) 10/18/2021 08:55 AM   MICROALBUR 10 06/15/2021 04:46 PM   MICROALBUR 10 07/29/2020 12:01 PM    Kidney Function Lab Results  Component Value Date/Time   CREATININE 1.0 02/03/2022 12:00 AM   CREATININE 1.24 (H) 01/24/2022 09:05 AM   CREATININE 1.34 (H) 10/18/2021 08:55 AM   GFR 57.98 (L) 03/10/2020 04:46 PM   GFRNONAA 61 09/11/2020 11:23 AM   GFRAA 70 09/11/2020 11:23 AM     Current antihyperglycemic regimen:  Toujeo 300units Inject 10 units at bedtime  Novolog 100units  Sliding Scale 2-8 units three times daily  Patient verbally confirms she is taking the above medications as directed. Yes  What recent interventions/DTPs have been made to improve glycemic control:  Pt is not taking Ozempic and Rybelsus but had side effects from the Rybelsus and stopped these.   Have there been any recent hospitalizations or ED visits since last visit with CPP? No  Patient denies hypoglycemic symptoms, including None  Patient denies hyperglycemic symptoms, including none  How often are you checking your blood sugar? 3-4 times daily  What are your blood sugars ranging?  Fasting: 02/04/22 156, 151  After Meal 02/03/22 291, 253, 174  On insulin? Yes, 10 units at bedtime and Sliding Scale 2-8 units three times daily   During the week, how often does your blood glucose drop below 70?  Happens once in a blue moon. Pt stated not  often anymore  Are you checking your feet daily/regularly? Yes  Adherence Review: Is the patient currently on a STATIN medication? Yes Is the patient currently on ACE/ARB medication? Yes Does the patient have >5 day gap between last estimated fill dates? CPP to review  Care Gaps: Last eye exam / Retinopathy Screening? Never done Last Annual Wellness Visit? Scheduled on 02/15/22 Last Diabetic Foot Exam? 01/24/22   Star Rating Drugs:  Medication:  Last Fill: Day Supply None noted     Elray Mcgregor, [place] Pharmacist Assistant  [phone]

## 2022-02-07 ENCOUNTER — Other Ambulatory Visit: Payer: Self-pay

## 2022-02-07 MED ORDER — BD PEN NEEDLE NANO 2ND GEN 32G X 4 MM MISC: 3 refills | Status: DC

## 2022-02-08 ENCOUNTER — Telehealth: Payer: Self-pay | Admitting: Hematology and Oncology

## 2022-02-08 ENCOUNTER — Telehealth: Payer: Self-pay

## 2022-02-08 NOTE — Telephone Encounter (Addendum)
02/08/22 Kelli,PA, called pt.  02/08/22 Pt reports INR is 3.8 today.  [phone]  Sent message to [facility] @ 1232 - awc

## 2022-02-08 NOTE — Telephone Encounter (Signed)
Spoke with patient regarding home INR of 3.8.  She states she continues 6 mg of warfarin daily.  She had been on antibiotic for about 10 days which was completed last week.  This potentially could still have an effect on the INR.  I will have her hold her warfarin today and then resume 6 mg daily.  She will repeat home INR in 1 week.  The patient expresses understanding.

## 2022-02-09 NOTE — Telephone Encounter (Signed)
Sugars are running a little high. Has AWV on 02/15/22, will let PCP know

## 2022-02-10 ENCOUNTER — Other Ambulatory Visit: Payer: Self-pay | Admitting: Family Medicine

## 2022-02-10 ENCOUNTER — Other Ambulatory Visit: Payer: Self-pay | Admitting: Hematology and Oncology

## 2022-02-10 DIAGNOSIS — D6859 Other primary thrombophilia: Secondary | ICD-10-CM

## 2022-02-10 DIAGNOSIS — Z7901 Long term (current) use of anticoagulants: Secondary | ICD-10-CM

## 2022-02-10 DIAGNOSIS — E1165 Type 2 diabetes mellitus with hyperglycemia: Secondary | ICD-10-CM | POA: Diagnosis not present

## 2022-02-10 DIAGNOSIS — I10 Essential (primary) hypertension: Secondary | ICD-10-CM

## 2022-02-10 DIAGNOSIS — E1122 Type 2 diabetes mellitus with diabetic chronic kidney disease: Secondary | ICD-10-CM | POA: Diagnosis not present

## 2022-02-14 ENCOUNTER — Telehealth: Payer: Self-pay | Admitting: Hematology and Oncology

## 2022-02-14 ENCOUNTER — Telehealth: Payer: Self-pay

## 2022-02-14 NOTE — Telephone Encounter (Signed)
Spoke with patient regarding home INR of 2.9 today.  This was after holding Coumadin 6 mg Monday, then resuming 6 mg daily.  I will have her take warfarin 3 mg on Mondays and Fridays with 6 mg the rest of the days.  She will repeat a home INR on June 19.  The patient verbalized understanding.

## 2022-02-14 NOTE — Telephone Encounter (Signed)
-----   Message from Marvia Pickles, PA-C sent at 02/14/2022 11:13 AM EDT ----- Thank you. I called her. ----- Message ----- From: Georgette Shell, RN Sent: 02/14/2022  10:38 AM EDT To: Thalia Bloodgood Mosher, PA-C  Called to report INR of 2.9.

## 2022-02-15 ENCOUNTER — Ambulatory Visit (INDEPENDENT_AMBULATORY_CARE_PROVIDER_SITE_OTHER): Payer: Medicare PPO

## 2022-02-15 VITALS — BP 102/60 | HR 71 | Resp 16 | Ht 60.0 in | Wt 175.4 lb

## 2022-02-15 DIAGNOSIS — Z23 Encounter for immunization: Secondary | ICD-10-CM

## 2022-02-15 DIAGNOSIS — Z0000 Encounter for general adult medical examination without abnormal findings: Secondary | ICD-10-CM

## 2022-02-15 DIAGNOSIS — Z1231 Encounter for screening mammogram for malignant neoplasm of breast: Secondary | ICD-10-CM | POA: Diagnosis not present

## 2022-02-17 ENCOUNTER — Other Ambulatory Visit: Payer: Self-pay | Admitting: Family Medicine

## 2022-02-17 DIAGNOSIS — I10 Essential (primary) hypertension: Secondary | ICD-10-CM

## 2022-02-19 ENCOUNTER — Encounter: Payer: Self-pay | Admitting: Hematology and Oncology

## 2022-02-19 LAB — PROTIME-INR: INR: 3.8 — AB (ref 0.80–1.20)

## 2022-02-21 NOTE — Patient Instructions (Signed)

## 2022-02-21 NOTE — Progress Notes (Cosign Needed)
Subjective:   Pamela [name] is a 76 y.o. female who presents for Medicare Annual (Subsequent) preventive examination.  This wellness visit is conducted by a nurse.  The patient's medications were reviewed and reconciled since the patient's last visit.  History details were provided by the patient.  The history appears to be reliable.    Medical History: Patient history and Family history was reviewed  Medications, Allergies, and preventative health maintenance was reviewed and updated.  Cardiac Risk Factors include: advanced age (>72mn, >>68women);diabetes mellitus;obesity (BMI >30kg/m2)     Objective:    Today's Vitals   02/15/22 1250  BP: 102/60  Pulse: 71  Resp: 16  Weight: 175 lb 6.4 oz (79.6 kg)  Height: 5' (1.524 m)  PainSc: 0-No pain   Body mass index is 34.26 kg/m.     02/03/2022   10:13 AM 03/12/2021   11:48 AM 02/03/2021    9:56 AM 08/04/2020   10:49 AM 02/26/2020   11:06 AM 02/18/2013    9:44 AM  Advanced Directives  Does Patient Have a Medical Advance Directive? No Yes No No No Patient does not have advance directive  Type of APension scheme managerPower of [place]a [place] will      Copy of [place] Chart?  No - copy requested      Would patient like information on creating a medical advance directive?    No - Patient declined Yes (MAU/Ambulatory/Procedural Areas - Information given)   Pre-existing out of facility DNR order (yellow form or pink MOST form)      No    Current Medications (verified) Outpatient Encounter Medications as of 02/15/2022  Medication Sig   albuterol (VENTOLIN HFA) 108 (90 Base) MCG/ACT inhaler INHALE 1 TO 2 PUFFS INTO THE LUNGS EVERY 6 HOURS AS NEEDED FOR WHEEZING OR SHORTNESS OF BREATH   amLODipine (NORVASC) 5 MG tablet TAKE 1 TABLET(5 MG) BY MOUTH DAILY   ASPIRIN LOW DOSE 81 MG EC tablet TAKE 1 TABLET(81 MG) BY MOUTH DAILY   atorvastatin (LIPITOR) 80 MG tablet Take 1 tablet (80 mg total) by  mouth daily.   azelastine (ASTELIN) 0.1 % nasal spray Place 2 sprays into both nostrils daily as needed for rhinitis or allergies.   B Complex-C (B-COMPLEX WITH VITAMIN C) tablet Take 1 tablet by mouth daily with lunch.   Calcium Citrate-Vitamin D (CALCIUM CITRATE + PO) Take by mouth.   cefdinir (OMNICEF) 300 MG capsule Take 1 capsule (300 mg total) by mouth 2 (two) times daily.   Cholecalciferol (VITAMIN D3) 50 MCG (2000 UT) TABS Take 4,000 Units by mouth daily with lunch.   Continuous Blood Gluc Receiver (FREESTYLE LIBRE 2 READER) DEVI 1 each by Does not apply route 4 (four) times daily - after meals and at bedtime. E11.40   Continuous Blood Gluc Sensor (FREESTYLE LIBRE 2 SENSOR) MISC 2 each by Does not apply route every 14 (fourteen) days. 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 Uses twice weekly   ferrous sulfate 325 (65 FE) MG tablet Take 325 mg by mouth daily with lunch.   GEMTESA 75 MG TABS Take by mouth.   Insulin Aspart FlexPen (NOVOLOG) 100 UNIT/ML Inject 2-8 Units into the skin 3 (three) times daily before meals.   insulin glargine, 1 Unit Dial, (TOUJEO) 300 UNIT/ML Solostar Pen Inject 10 Units into the skin at bedtime.   Insulin Pen Needle (BD PEN  NEEDLE NANO 2ND GEN) 32G X 4 MM MISC USE DAILY AS DIRECTED   ipratropium-albuterol (DUONEB) 0.5-2.5 (3) MG/3ML SOLN Take 3 mLs by nebulization 4 (four) times daily as needed (wheezing/shortness of breath).   lisinopril (ZESTRIL) 20 MG tablet TAKE 2 TABLETS(40 MG) BY MOUTH TWICE DAILY   magnesium oxide (MAG-OX) 400 (240 Mg) MG tablet TAKE 2 TABLETS(800 MG) BY MOUTH TWICE DAILY (Patient taking differently: Take 800 mg by mouth 2 (two) times daily.)   Multiple Vitamins-Minerals (PRESERVISION AREDS 2 PO) Take by mouth.   ondansetron (ZOFRAN-ODT) 8 MG disintegrating tablet Take 8 mg by mouth every 8 (eight) hours as needed for nausea or vomiting.    pantoprazole (PROTONIX) 40 MG  tablet Take 1 tablet (40 mg total) by mouth every morning.   polyethylene glycol (MIRALAX / GLYCOLAX) 17 g packet Take 17 g by mouth as needed. Uses 1-2 times weekly   Semaglutide,0.25 or 0.'5MG'$ /DOS, 2 MG/1.5ML SOPN Inject 0.25 mg into the skin once a week.   sodium chloride (OCEAN) 0.65 % SOLN nasal spray Place 1-2 sprays into both nostrils 4 (four) times daily as needed for congestion.   Vibegron (GEMTESA PO) Take 75 mg by mouth at bedtime.   warfarin (COUMADIN) 1 MG tablet Take 1 tablet (1 mg total) by mouth as directed.   warfarin (COUMADIN) 5 MG tablet TAKE 1 TABLET BY MOUTH EVERY DAY   warfarin (COUMADIN) 5 MG tablet Take 5 mg by mouth as directed. On Tues, Thur, Sat, and Sunday   warfarin (COUMADIN) 6 MG tablet Take 6 mg by mouth as directed. Daily   furosemide (LASIX) 40 MG tablet Take 1 tablet (40 mg total) by mouth daily.   isosorbide mononitrate (IMDUR) 30 MG 24 hr tablet Take 1 tablet (30 mg total) by mouth daily.   [DISCONTINUED] amLODipine (NORVASC) 5 MG tablet TAKE 1 TABLET(5 MG) BY MOUTH DAILY (Patient taking differently: 10 mg.)   [DISCONTINUED] Semaglutide (RYBELSUS) 7 MG TABS Take 7 mg by mouth daily. (Patient not taking: Reported on 02/21/2022)   No facility-administered encounter medications on file as of 02/15/2022.    Allergies (verified) Ketek [telithromycin], Loxapine succinate, Naproxen sodium, Amoxapine and related, Darvon, Belsomra [suvorexant], Cymbalta [duloxetine hcl], Duloxetine, Gabapentin, Lyrica [pregabalin], Amoxicillin, and Propoxyphene   History: Past Medical History:  Diagnosis Date   Anticoagulated on Coumadin    chronic--- managed by hematology/ oncology,   Asthma, mild intermittent    followed by pcp--- per pt last exacerbation winter 2021 w/ acute bronchitis   Bilateral leg cramps    Chronic constipation    CKD (chronic kidney disease), stage III ([place])    Closed bimalleolar fracture of left ankle 02/26/2020   DDD (degenerative disc disease),  cervical    w/ spondylosis,  per pt last steroid injection 06/ 2022   DDD (degenerative disc disease), lumbosacral    DVT (deep venous thrombosis) ([place]) 07/30/2013   Dyspnea    occasionally   GERD (gastroesophageal reflux disease)    History of cardiac murmur as a child    History of DVT of lower extremity    left lower extremity in 1980s, fell when bowling   History of pulmonary embolus (PE) 1993   per pt left lung post op 2 wks cholecystectomy   History of rheumatic fever as a child    per last echo 12-31-2019 no valvular issues   History of TIA (transient ischemic attack)    2014 and 2018 or 2019,  per pt no residual   HTN (  hypertension)    followed by pcp   Hyperlipidemia    IDA (iron deficiency anemia)    Macular degeneration of both eyes    Mild obstructive sleep apnea    per pt dx 2017 tried to uses cpap but intolerant   Mixed incontinence urge and stress    urologist--- dr Nila Nephew   OA (osteoarthritis) 07/06/2018   Osteoporosis    PAF (paroxysmal atrial fibrillation) ([place]) 10/01/2014   cardiologist--- dr Godfrey Pick tobb;   cardiac cath 02-18-2013 normal coronaries arteries, ef 50%, cath done since echo showed ef 30-35%; nucleat stress study 04/ 2020 normal , normal echo 04/ 2021,  event monitor 09-14-2020 rare ST/AT variable block   PONV (postoperative nausea and vomiting)    Protein S deficiency ([place])    followed by hemotology/ oncology-- dr d. Bobby Rumpf ([facility]) dx 1980s;  prior DVT left lower leg 1980s and left lung PE 1993; chronic  coumadin since 1980s   PVC's (premature ventricular contractions)    followed by cardiology   S/P cardiac catheterization 02/2013   Normal coronaries; low normal EF at 50%   Solitary pulmonary nodule on lung CT 02/06/2019   First noted 01/13/2014 > no change as of 12/21/2018   Spondylolisthesis, lumbar region 08/09/2018   Stroke ([place])    TIA in 2018 or 2019   Transient ischemic attack 07/30/2013   Type 2 diabetes mellitus  treated with insulin ([place])    endocrilogist--- whitney reardon NP     (03-10-2021  pt continuously checks blood sugar throughout the day w/ Libre, fasting sugar --- 69--200)   Wears glasses    Wears hearing aid in both ears    Past Surgical History:  Procedure Laterality Date   ABDOMINAL HYSTERECTOMY     BLEPHAROPLASTY     CARDIAC CATHETERIZATION  02/18/2013   @ [facility]  by Dr Martinique;  normal coronaries w/ preserved LVF, ef 50%;   previous cath 2001 normal ef 65%   CARPAL TUNNEL RELEASE Bilateral 1994   CATARACT EXTRACTION W/ INTRAOCULAR LENS IMPLANT Bilateral 2017   CHOLECYSTECTOMY, LAPAROSCOPIC  1993   COLONOSCOPY     EAR BIOPSY Left    FINGER SURGERY Left 2018   thumb   FOOT SURGERY     FOOT TENDON SURGERY Right    early 2000s   HAND SURGERY     HARDWARE REMOVAL Left 03/12/2021   Procedure: HARDWARE REMOVAL;  Surgeon: Nicholes Stairs, MD;  Location: [facility];  Service: Orthopedics;  Laterality: Left;  60 MINS   ORIF ANKLE FRACTURE Left 02/26/2020   Procedure: OPEN REDUCTION INTERNAL FIXATION (ORIF) ANKLE FRACTURE;  Surgeon: Nicholes Stairs, MD;  Location: [facility];  Service: Orthopedics;  Laterality: Left;  90 mins   TONSILLECTOMY AND ADENOIDECTOMY Bilateral    TOTAL KNEE ARTHROPLASTY Left 03/2005   TOTAL VAGINAL HYSTERECTOMY  1988   per pt still has ovaries   Family History  Problem Relation Age of Onset   Cirrhosis Mother    Antithrombin III deficiency Mother        multiple emboli   Ulcerative colitis Mother    Diabetes Father    Coronary artery disease Father    Heart attack Father    Hypertension Father    Kidney disease Father    Hypertension Sister    Diabetes Sister    Heart attack Sister        age 12    Heart attack Brother    Hypertension Brother  Lung cancer Brother    Protein S deficiency Daughter    Stroke Neg Hx    Colitis Neg Hx    Colon polyps Neg Hx    Esophageal cancer Neg Hx    Liver cancer Neg Hx    Pancreatic  cancer Neg Hx    Rectal cancer Neg Hx    Stomach cancer Neg Hx    Social History   Socioeconomic History   Marital status: Married    Spouse name: [name]   Number of children: Not on file   Years of education: Not on file   Highest education level: Not on file  Occupational History   Not on file  Tobacco Use   Smoking status: Never   Smokeless tobacco: Never  Vaping Use   Vaping Use: Never used  Substance and Sexual Activity   Alcohol use: No   Drug use: Never   Sexual activity: Not on file  Other Topics Concern   Not on file  Social History Narrative   Lives with spouse in [place].  2 grown daughters.   Retired Glass blower/designer     Social Determinants of Radio broadcast assistant Strain: Not on file  Food Insecurity: No Food Insecurity (09/30/2020)   Hunger Vital Sign    Worried About Running Out of Food in the Last Year: Never true    [place] in the Last Year: Never true  Transportation Needs: No Transportation Needs (09/30/2020)   PRAPARE - Hydrologist (Medical): No    Lack of Transportation (Non-Medical): No  Physical Activity: Not on file  Stress: Not on file  Social Connections: Not on file    Tobacco Counseling Counseling given: Patient does not use tobacco products   Clinical Intake:  Pre-visit preparation completed: Yes Pain : No/denies pain Pain Score: 0-No pain   BMI - recorded: 34.26 Nutritional Status: BMI > 30  Obese Nutritional Risks: None Diabetes: No    Activities of Daily Living    02/15/2022    1:16 PM 03/12/2021   11:57 AM  In your present state of health, do you have any difficulty performing the following activities:  Hearing? 0 1  Comment  hearing aids left at home  Vision? 0 1  Difficulty concentrating or making decisions? 0 0  Walking or climbing stairs? 1 1  Dressing or bathing? 0 0  Doing errands, shopping? 0   Preparing Food and eating ? N   Using the Toilet? N   In the past six  months, have you accidently leaked urine? Y   Do you have problems with loss of bowel control? N   Managing your Medications? N   Managing your Finances? N   Housekeeping or managing your Housekeeping? N     Patient Care Team: [name], MD as PCP - General (Family Medicine) Berniece Salines, DO as PCP - Cardiology (Cardiology) Marice Potter, MD as Consulting Physician (Oncology) Ishmael Holter, [place] (Optometry) Ortho, [place] (Specialist) Tanda Rockers, MD as Consulting Physician (Pulmonary Disease) Rozetta Nunnery, MD (Inactive) as Consulting Physician (Otolaryngology) Berniece Salines, DO as Consulting Physician (Cardiology) Renato Shin, MD (Inactive) as Consulting Physician (Endocrinology) Gavin Pound, MD as Consulting Physician (Rheumatology) Armbruster, Carlota Raspberry, MD as Consulting Physician (Gastroenterology) Burnice Logan, [facility] (Inactive) as Pharmacist (Pharmacist)     Assessment:   This is a routine wellness examination for Pamela [name].  Hearing/Vision screen No results found.  Dietary issues and exercise activities  discussed: Current Exercise Habits: The patient does not participate in regular exercise at present   Depression Screen    02/15/2022    1:16 PM 01/24/2022    8:21 AM 11/16/2021   11:37 AM 03/10/2021    8:35 AM 11/12/2020    9:42 AM 06/07/2020    7:01 PM  PHQ 2/9 Scores  PHQ - 2 Score 0 0 0 0 0 0  PHQ- 9 Score     0     Fall Risk    02/15/2022    1:15 PM 01/24/2022    8:21 AM 11/16/2021   11:37 AM 10/18/2021    8:09 AM 06/29/2021   11:15 AM  Fall Risk   Falls in the past year? 1 1 0 0 0  Number falls in past yr: 0 0 0 0 0  Injury with Fall? 1 1 0 0 0  Risk for fall due to : Impaired balance/gait;History of fall(s)    No Fall Risks  Follow up Falls evaluation completed;Falls prevention discussed Falls evaluation completed Falls evaluation completed Falls evaluation completed Falls evaluation completed    FALL RISK PREVENTION PERTAINING TO THE  HOME:  Any stairs in or around the home? Yes  - patient does not use stairs If so, are there any without handrails? No  Home free of loose throw rugs in walkways, pet beds, electrical cords, etc? Yes  Adequate lighting in your home to reduce risk of falls? Yes   ASSISTIVE DEVICES UTILIZED TO PREVENT FALLS:  Use of a cane, walker or w/c? Yes  Gait slow and steady with assistive device  Cognitive Function:    10/18/2021    8:49 AM  MMSE - Mini Mental State Exam  Orientation to time 5  Orientation to Place 5  Registration 3  Attention/ Calculation 5  Recall 2  Language- name 2 objects 2  Language- repeat 1  Language- follow 3 step command 3  Language- read & follow direction 1  Write a sentence 1  Copy design 1  Total score 29        02/21/2022    1:17 PM  6CIT Screen  What Year? 0 points  What month? 0 points  What time? 0 points  Count back from 20 0 points  Months in reverse 2 points  Repeat phrase 2 points  Total Score 4 points    Immunizations Immunization History  Administered Date(s) Administered   Fluad Quad(high Dose 65+) 05/26/2020, 05/13/2021   Influenza,inj,Quad PF,6+ Mos 06/04/2013   PFIZER Comirnaty(Gray Top)Covid-19 Tri-Sucrose Vaccine 02/04/2021   PFIZER(Purple Top)SARS-COV-2 Vaccination 09/22/2019, 10/12/2019, 06/16/2020   PNEUMOCOCCAL CONJUGATE-20 02/15/2022   Pfizer Covid-19 Vaccine Bivalent Booster 36yr & up 08/17/2021   Pneumococcal Conjugate-13 07/30/2014   Pneumococcal Polysaccharide-23 03/31/2008   Tdap 11/14/2013   Zoster, Live 03/31/2008, 06/03/2011    TDAP status: Up to date  Flu Vaccine status: Up to date  Pneumococcal vaccine status: Completed during today's visit.  Covid-19 vaccine status: Completed vaccines  Qualifies for Shingles Vaccine? Yes   Zostavax completed Yes   Shingrix Completed?: No.    Education has been provided regarding the importance of this vaccine. Patient has been advised to call insurance company to  determine out of pocket expense if they have not yet received this vaccine. Advised may also receive vaccine at local pharmacy or Health Dept. Verbalized acceptance and understanding.  Screening Tests Health Maintenance  Topic Date Due   Hepatitis C Screening  Never done   Zoster Vaccines- Shingrix (  1 of 2) Never done   COVID-19 Vaccine (6 - Pfizer series) 12/16/2021   MAMMOGRAM  03/01/2022   INFLUENZA VACCINE  04/05/2022   HEMOGLOBIN A1C  07/27/2022   OPHTHALMOLOGY EXAM  10/21/2022   FOOT EXAM  01/25/2023   TETANUS/TDAP  11/15/2023   Pneumonia Vaccine 73+ Years old  Completed   DEXA SCAN  Completed   HPV VACCINES  Aged Out   COLONOSCOPY (Pts 45-65yr Insurance coverage will need to be confirmed)  Discontinued    Health Maintenance  Health Maintenance Due  Topic Date Due   Hepatitis C Screening  Never done   Zoster Vaccines- Shingrix (1 of 2) Never done   COVID-19 Vaccine (6 - Pfizer series) 12/16/2021    Colorectal cancer screening: No longer required.   Mammogram status: Ordered for mobile bus July 31. Pt provided with contact info and advised to call to schedule appt.   Bone Density status: Completed 07/2021. Results reflect: Bone density results: OSTEOPOROSIS. Repeat every 2 years.  Lung Cancer Screening: (Low Dose CT Chest recommended if Age 77-80years, 30 pack-year currently smoking OR have quit w/in 15years.) does not qualify.   Additional Screening:  Vision Screening: Recommended annual ophthalmology exams for early detection of glaucoma and other disorders of the eye. Is the patient up to date with their annual eye exam?  Yes  Who is the provider or what is the name of the office in which the patient attends annual eye exams? [facility]: Recommended annual dental exams for proper oral hygiene    Plan:    1- Shingrix Vaccine recommended - get at pharmacy 2- Exercise as tolerated 3- Continue healthy diet 4- Mammogram ordered - Mobile  Mammo in July  I have personally reviewed and noted the following in the patient's chart:   Medical and social history Use of alcohol, tobacco or illicit drugs  Current medications and supplements including opioid prescriptions.  Functional ability and status Nutritional status Physical activity Advanced directives List of other physicians Hospitalizations, surgeries, and ER visits in previous 12 months Vitals Screenings to include cognitive, depression, and falls Referrals and appointments  In addition, I have reviewed and discussed with patient certain preventive protocols, quality metrics, and best practice recommendations. A written personalized care plan for preventive services as well as general preventive health recommendations were provided to patient.     KErie Noe LPN   [id number]

## 2022-02-22 ENCOUNTER — Telehealth: Payer: Self-pay | Admitting: Hematology and Oncology

## 2022-02-22 ENCOUNTER — Telehealth: Payer: Self-pay | Admitting: *Deleted

## 2022-02-22 NOTE — Telephone Encounter (Signed)
Pamela [name] aware and will reach out to patient with adjustments . Patient aware to hold todays dose until she hears from [place].

## 2022-02-22 NOTE — Telephone Encounter (Signed)
The patient states called this morning to review for a home INR of 3.9 on June 19.  She states she has been taking warfarin as prescribed, '3mg'$  Mon and Fri and '6mg'$  the rest of days.  She did take '3mg'$  last night, June 19, despite home INR of 3.9. She denies any changes in medication or diet. She denies abnormal bruising or bleeding. I will have her hold warfarin today, then resume '5mg'$  daily which is approximately 10% dose reduction. She will repeat a home INR on June 26.

## 2022-02-22 NOTE — Telephone Encounter (Signed)
Patient called reporting that her INR yesterday was 3.9 and she is asking what dose adjustment she needs to make this week. Please return her call [phone]

## 2022-02-28 ENCOUNTER — Telehealth: Payer: Self-pay

## 2022-02-28 ENCOUNTER — Other Ambulatory Visit: Payer: Self-pay | Admitting: Nurse Practitioner

## 2022-02-28 ENCOUNTER — Other Ambulatory Visit: Payer: Self-pay | Admitting: Hematology and Oncology

## 2022-02-28 DIAGNOSIS — M1811 Unilateral primary osteoarthritis of first carpometacarpal joint, right hand: Secondary | ICD-10-CM | POA: Diagnosis not present

## 2022-02-28 DIAGNOSIS — S92355A Nondisplaced fracture of fifth metatarsal bone, left foot, initial encounter for closed fracture: Secondary | ICD-10-CM | POA: Diagnosis not present

## 2022-02-28 DIAGNOSIS — E1140 Type 2 diabetes mellitus with diabetic neuropathy, unspecified: Secondary | ICD-10-CM

## 2022-02-28 DIAGNOSIS — M79672 Pain in left foot: Secondary | ICD-10-CM | POA: Diagnosis not present

## 2022-02-28 NOTE — Telephone Encounter (Addendum)
Pt notified of Melissa,P, NP's recommendation below and verbalized understanding.  ----- Message from Pascal Lux, NP sent at 02/28/2022  9:42 AM EDT ----- Regarding: RE: INR 1.8 Kelli had her at 5 mg daily. Let's do 6 mg M-W and 5 mg the rest of the days. Recheck next week.  ----- Message ----- From: Hipolito Bayley, RN Sent: 02/28/2022   9:26 AM EDT To: Pascal Lux, NP Subject: INR 1.8                                        Pt called to report her INR is 1.8 this morning. (Please see Kelli's note from last week regarding current medication dosage). Thanks!

## 2022-03-21 ENCOUNTER — Telehealth: Payer: Self-pay

## 2022-03-21 NOTE — Telephone Encounter (Signed)
Pt LVM on nurse line that her INR was 2.3. She currently takes Coumadin '6mg'$  po qd on Sun, Mon, Tues, @ Wed. She takes coumadin '5mg'$  po qd on Thur, Fri, and Sat.  Dr Bobby Rumpf recommends she stay on the same drug dosing, as above.

## 2022-03-22 ENCOUNTER — Telehealth: Payer: Self-pay | Admitting: Nurse Practitioner

## 2022-03-22 ENCOUNTER — Other Ambulatory Visit: Payer: Self-pay | Admitting: Hematology and Oncology

## 2022-03-22 DIAGNOSIS — M13841 Other specified arthritis, right hand: Secondary | ICD-10-CM | POA: Diagnosis not present

## 2022-03-22 DIAGNOSIS — M79644 Pain in right finger(s): Secondary | ICD-10-CM | POA: Diagnosis not present

## 2022-03-22 NOTE — Telephone Encounter (Signed)
I just pulled her report.  Although she is having some spikes in the afternoon, it is likely due to what she is eating/drinking. I dont think she needs an adjustment in her insulin dose just yet.  Encourage her to eat 3 well balanced meals, avoiding sugary drinks or desserts and snacking.

## 2022-03-22 NOTE — Telephone Encounter (Signed)
Pt notified and agrees. 

## 2022-03-22 NOTE — Telephone Encounter (Signed)
Will you look at patients readings under [place]? She said she is having high readings

## 2022-03-23 ENCOUNTER — Ambulatory Visit (INDEPENDENT_AMBULATORY_CARE_PROVIDER_SITE_OTHER): Payer: Medicare PPO

## 2022-03-23 ENCOUNTER — Telehealth: Payer: Self-pay | Admitting: Hematology and Oncology

## 2022-03-23 DIAGNOSIS — E782 Mixed hyperlipidemia: Secondary | ICD-10-CM

## 2022-03-23 DIAGNOSIS — E1140 Type 2 diabetes mellitus with diabetic neuropathy, unspecified: Secondary | ICD-10-CM

## 2022-03-23 DIAGNOSIS — I129 Hypertensive chronic kidney disease with stage 1 through stage 4 chronic kidney disease, or unspecified chronic kidney disease: Secondary | ICD-10-CM

## 2022-03-23 NOTE — Progress Notes (Signed)
Chronic Care Management Pharmacy Note  03/23/2022 Name:  Pamela [name] MRN:  [id number] DOB:  14-Apr-1946  Plan Updates:  Sugars are very elevated, reached out to endo Patient having trouble staying asleep. Is able to fall asleep easily but 2-3 hours in she'll wake up and be unable to fall back asleep. Used to be on Trazodone but was stopped when Duloxetine was stopped. Is asking for help from PCP. She has tried Melatonin on her own but doesn't do anything  Subjective: Pamela [name] is an 76 y.o. year old female who is a primary patient of Cox, Kirsten, MD.  The CCM team was consulted for assistance with disease management and care coordination needs.    Engaged with patient by telephone for follow up visit in response to provider referral for pharmacy case management and/or care coordination services.   Consent to Services:  The patient was given information about Chronic Care Management services, agreed to services, and gave verbal consent prior to initiation of services.  Please see initial visit note for detailed documentation.   Patient Care Team: [name], MD as PCP - General (Family Medicine) Berniece Salines, DO as PCP - Cardiology (Cardiology) Marice Potter, MD as Consulting Physician (Oncology) Ishmael Holter, [place] (Optometry) Orth[facility], [facility] (Specialist) Tanda Rockers, MD as Consulting Physician (Pulmonary Disease) Rozetta Nunnery, MD (Inactive) as Consulting Physician (Otolaryngology) Berniece Salines, DO as Consulting Physician (Cardiology) Renato Shin, MD (Inactive) as Consulting Physician (Endocrinology) Gavin Pound, MD as Consulting Physician (Rheumatology) Armbruster, Carlota Raspberry, MD as Consulting Physician (Gastroenterology) Lane Hacker, [facility] (Pharmacist)    Recent office visits:  01/24/22 [name] MD. Seen for routine visit. D/C Carvedilol, Hydrocodone-APAP, Methocarbamol 7102m, Pregabalin 741m Docusate Sodium.    12/30/21 CoRochel BromeD. Orders Only. Patient unable to find ozempic in stock. Dr CoTobie Poetecommended Rybelsus 7 mg be sent as patient is going [place] of [place].    12/29/21 GrJackie PlumMA. Orders Only. Increased Ozempic to 0.2566meekly.   11/16/21 CoxRochel Brome. Seen for Osteopenia. Administered Prolia 33m22m  10/18/21 Cox,[name] Seen for routine visit. D/C Budesonide Inhaler and Pregabalin 75mg63m 09/10/21 DavisMarge Duncans. Seen for Acute Laryngopharyngitis. Started on Ciprofloxacin HCI 250mg.6m12/27/23 MorrisMeredith Modyecommend increase amlodipine to 10 mg daily.    Recent consult visits:  02/03/22 (Oncology) Lewis,Lavera Guiseeen for Anemia in CKD. No med changes.   12/28/21 (Rheumatology) Rice, Vernelle Emeraldnitial Visit. No med changes.   12/20/21 (Hematology/Oncology) MosherRosanne Sack Coumadin was increased to 6mg da41m 2 weeks ago.   12/09/21 (Endocrinology) ReardonRayetta Piggen for routine visit. She is advised to lower her Toujeo to 10 units SQ nightly and continue her Novolot 2-8 units TID with meals if glucose is above 90 and she is eating (Specific instructions on how to titrate insulin dosage based on glucose readings given to patient    09/07/21 (Oncology) Mosher,Rosanne SackI will have her switch to Coumadin 5 mg on Wednesday and Sunday and 6 mg the rest of the days   Hospital visits:  None  Objective:  Lab Results  Component Value Date   CREATININE 1.0 02/03/2022   BUN 18 02/03/2022   GFR 57.98 (L) 03/10/2020   GFRNONAA 61 09/11/2020   GFRAA 70 09/11/2020   NA 138 02/03/2022   K 4.6 02/03/2022   CALCIUM 8.9 02/03/2022   CO2 31 (A) 02/03/2022  GLUCOSE 106 (H) 01/24/2022    Lab Results  Component Value Date/Time   HGBA1C 7.2 (H) 01/24/2022 09:05 AM   HGBA1C 7.4 (H) 10/18/2021 08:55 AM   GFR 57.98 (L) 03/10/2020 04:46 PM   GFR 61.14 02/05/2019 12:26 PM   MICROALBUR 10 06/15/2021 04:46 PM   MICROALBUR 10 07/29/2020 12:01 PM    Last  diabetic Eye exam:  Lab Results  Component Value Date/Time   HMDIABEYEEXA No Retinopathy 10/21/2021 12:00 AM    Last diabetic Foot exam: No results found for: "HMDIABFOOTEX"   Lab Results  Component Value Date   CHOL 186 01/24/2022   HDL 59 01/24/2022   LDLCALC 111 (H) 01/24/2022   TRIG 87 01/24/2022   CHOLHDL 3.2 01/24/2022       Latest Ref Rng & Units 02/03/2022   12:00 AM 01/24/2022    9:05 AM 10/18/2021    8:55 AM  Hepatic Function  Total Protein 6.0 - 8.5 g/dL  6.8  6.8   Albumin 3.5 - 5.0 4.2     3.9  4.1   AST 13 - 35 '31     22  24   ' ALT 7 - 35 U/L '25     19  18   ' Alk Phosphatase 25 - 125 63     79  82   Total Bilirubin 0.0 - 1.2 mg/dL  0.3  0.4      This result is from an external source.    Lab Results  Component Value Date/Time   TSH 2.510 10/18/2021 08:55 AM   TSH 2.020 11/14/2019 12:14 PM   FREET4 1.23 01/21/2013 12:47 PM       Latest Ref Rng & Units 02/03/2022   12:00 AM 01/24/2022    9:05 AM 10/18/2021    8:55 AM  CBC  WBC  6.9     9.8  8.5   Hemoglobin 12.0 - 16.0 13.0     13.2  13.0   Hematocrit 36 - 46 40     39.2  38.7   Platelets 150 - 400 K/uL 311     371  289      This result is from an external source.    No results found for: "VD25OH"  Clinical ASCVD: Yes  The 10-year ASCVD risk score (Arnett DK, et al., 2019) is: 30.6%   Values used to calculate the score:     Age: 76 years     Sex: Female     Is Non-Hispanic African American: No     Diabetic: Yes     Tobacco smoker: No     Systolic Blood Pressure: 606 mmHg     Is BP treated: Yes     HDL Cholesterol: 59 mg/dL     Total Cholesterol: 186 mg/dL       02/15/2022    1:16 PM 01/24/2022    8:21 AM 11/16/2021   11:37 AM  Depression screen PHQ 2/9  Decreased Interest 0 0 0  Down, Depressed, Hopeless 0 0 0  PHQ - 2 Score 0 0 0      Social History   Tobacco Use  Smoking Status Never  Smokeless Tobacco Never   BP Readings from Last 3 Encounters:  02/15/22 102/60  02/03/22 (!)  188/79  01/24/22 128/60   Pulse Readings from Last 3 Encounters:  02/15/22 71  02/03/22 81  01/24/22 76   Wt Readings from Last 3 Encounters:  02/15/22 175 lb 6.4 oz (79.6 kg)  02/03/22  175 lb 9.6 oz (79.7 kg)  01/24/22 177 lb 3.2 oz (80.4 kg)   BMI Readings from Last 3 Encounters:  02/15/22 34.26 kg/m  02/03/22 34.29 kg/m  01/24/22 33.48 kg/m    Assessment/Interventions: Review of patient past medical history, allergies, medications, health status, including review of consultants reports, laboratory and other test data, was performed as part of comprehensive evaluation and provision of chronic care management services.   SDOH:  (Social Determinants of Health) assessments and interventions performed: Yes SDOH Interventions    Flowsheet Row Most Recent Value  SDOH Interventions   Financial Strain Interventions Intervention Not Indicated  Transportation Interventions Intervention Not Indicated      SDOH Screenings   Alcohol Screen: Not on file  Depression (PHQ2-9): Low Risk  (02/15/2022)   Depression (PHQ2-9)    PHQ-2 Score: 0  Financial Resource Strain: Low Risk  (03/23/2022)   Overall Financial Resource Strain (CARDIA)    Difficulty of Paying Living Expenses: Not hard at all  Food Insecurity: No Food Insecurity (09/30/2020)   Hunger Vital Sign    Worried About Running Out of Food in the Last Year: Never true    Ran Out of Food in the Last Year: Never true  Housing: Low Risk  (09/30/2020)   Housing    Last Housing Risk Score: 0  Physical Activity: Not on file  Social Connections: Not on file  Stress: Not on file  Tobacco Use: Low Risk  (02/21/2022)   Patient History    Smoking Tobacco Use: Never    Smokeless Tobacco Use: Never    Passive Exposure: Not on file  Transportation Needs: No Transportation Needs (03/23/2022)   PRAPARE - Transportation    Lack of Transportation (Medical): No    Lack of Transportation (Non-Medical): No    CCM Care Plan  Allergies   Allergen Reactions   Ketek [Telithromycin] Nausea And Vomiting and Rash   Loxapine Succinate Hives   Naproxen Sodium Anaphylaxis and Hives   Amoxapine And Related Hives   Darvon Nausea And Vomiting   Belsomra [Suvorexant] Other (See Comments)    "Nightmares"   Cymbalta [Duloxetine Hcl] Other (See Comments)    "Blackouts"   Duloxetine    Gabapentin Other (See Comments)    Blurry vision   Lyrica [Pregabalin]     Makes her sedated   Amoxicillin Rash   Propoxyphene Nausea And Vomiting    Medications Reviewed Today     Reviewed by Lane Hacker, [facility] (Pharmacist) on 03/23/22 at 1021  Med List Status: <None>   Medication Order Taking? Sig Documenting Provider Last Dose Status Informant  albuterol (VENTOLIN HFA) 108 (90 Base) MCG/ACT inhaler [id number]  INHALE 1 TO 2 PUFFS INTO THE LUNGS EVERY 6 HOURS AS NEEDED FOR WHEEZING OR SHORTNESS OF BREATH Marge Duncans, PA-C  Active   amLODipine (NORVASC) 5 MG tablet [id number]  TAKE 1 TABLET(5 MG) BY MOUTH DAILY Cox, Kirsten, MD  Active   ASPIRIN LOW DOSE 81 MG EC tablet [id number]  TAKE 1 TABLET(81 MG) BY MOUTH DAILY Tobb, Kardie, DO  Active   atorvastatin (LIPITOR) 80 MG tablet [id number]  Take 1 tablet (80 mg total) by mouth daily. Cox, Kirsten, MD  Active   azelastine (ASTELIN) 0.1 % nasal spray [id number]  Place 2 sprays into both nostrils daily as needed for rhinitis or allergies. Marge Duncans, PA-C  Active   B Complex-C (B-COMPLEX WITH VITAMIN C) tablet [id number]  Take 1 tablet by mouth daily with lunch. [provider]  Active Self  Calcium Citrate-Vitamin D (CALCIUM CITRATE + PO) [id number]  Take by mouth. [provider]  Active   cefdinir (OMNICEF) 300 MG capsule [id number]  Take 1 capsule (300 mg total) by mouth 2 (two) times daily. Cox, Kirsten, MD  Active   Cholecalciferol (VITAMIN D3) 50 MCG (2000 UT) TABS [id number]  Take 4,000 Units by mouth daily with lunch. [provider]  Active Self  Continuous Blood Gluc  Receiver (FREESTYLE LIBRE 2 READER) DEVI [id number]  1 each by Does not apply route 4 (four) times daily - after meals and at bedtime. E11.40 CoxElnita Maxwell, MD  Active   Continuous Blood Gluc Sensor (FREESTYLE LIBRE 2 SENSOR) [place] [id number]  2 each by Does not apply route every 14 (fourteen) days. E11.40 CoxElnita Maxwell, MD  Active   estradiol (ESTRACE) 0.1 MG/GM vaginal cream [id number]  Place 1 Applicatorful vaginally 2 (two) times a week. Uses twice weekly [provider]  Active Self  ferrous sulfate 325 (65 FE) MG tablet [id number]  Take 325 mg by mouth daily with lunch. [provider]  Active Self  furosemide (LASIX) 40 MG tablet [id number]  Take 1 tablet (40 mg total) by mouth daily. Tobb, Kardie, DO  Expired 01/24/22 2359   GEMTESA 75 MG TABS [id number]  Take by mouth. [provider]  Active   Insulin Aspart FlexPen (NOVOLOG) 100 UNIT/ML [id number]  Inject 2-8 Units into the skin 3 (three) times daily before meals. Brita Romp, NP  Active   insulin glargine, 1 Unit Dial, (TOUJEO) 300 UNIT/ML Solostar Pen [id number]  Inject 10 Units into the skin at bedtime. Cox, Kirsten, MD  Active   Insulin Pen Needle (BD PEN NEEDLE NANO 2ND GEN) 32G X 4 MM MISC [id number]  USE DAILY AS DIRECTED Cox, Kirsten, MD  Active   ipratropium-albuterol (DUONEB) 0.5-2.5 (3) MG/3ML SOLN [id number]  Take 3 mLs by nebulization 4 (four) times daily as needed (wheezing/shortness of breath). [provider]  Active Self  isosorbide mononitrate (IMDUR) 30 MG 24 hr tablet [id number]  Take 1 tablet (30 mg total) by mouth daily. Tobb, Kardie, DO  Expired 01/24/22 2359   lisinopril (ZESTRIL) 20 MG tablet [id number]  TAKE 2 TABLETS(40 MG) BY MOUTH TWICE DAILY Cox, Kirsten, MD  Active   magnesium oxide (MAG-OX) 400 (240 Mg) MG tablet [id number]  TAKE 2 TABLETS(800 MG) BY MOUTH TWICE DAILY  Patient taking differently: Take 800 mg by mouth 2 (two) times daily.   Lillard Anes, MD  Active Self   Multiple Vitamins-Minerals (PRESERVISION AREDS 2 PO) [id number]  Take by mouth. [provider]  Active   ondansetron (ZOFRAN-ODT) 8 MG disintegrating tablet [id number]  Take 8 mg by mouth every 8 (eight) hours as needed for nausea or vomiting.  [provider]  Active Self  pantoprazole (PROTONIX) 40 MG tablet [id number]  Take 1 tablet (40 mg total) by mouth every morning. Cox, Kirsten, MD  Active Self  polyethylene glycol (MIRALAX / GLYCOLAX) 17 g packet [id number]  Take 17 g by mouth as needed. Uses 1-2 times weekly [provider]  Active Self  Semaglutide,0.25 or 0.5MG/DOS, 2 MG/1.5ML SOPN [id number] No Inject 0.25 mg into the skin once a week.  Patient not taking: Reported on 03/23/2022   CoxElnita Maxwell, MD Not Taking Consider Medication Status and Discontinue   sodium chloride (OCEAN) 0.65 % SOLN nasal spray [id number]  Place 1-2 sprays into both nostrils 4 (four) times daily as needed for congestion. [provider]  Active Self  Vibegron (GEMTESA PO) [id number]  Take 75 mg by mouth at bedtime. [provider]  Active Self  warfarin (COUMADIN) 1 MG tablet [id number]  Take 1 tablet (1 mg total) by mouth as directed. Mosher, Vida Roller A, PA-C  Active   warfarin (COUMADIN) 5 MG tablet [id number]  TAKE 1 TABLET BY MOUTH EVERY DAY Lewis, Dequincy A, MD  Active   warfarin (COUMADIN) 5 MG tablet [id number]  Take 5 mg by mouth as directed. On Tues, Thur, Sat, and Sunday [place] Scrape A, NP  Active   warfarin (COUMADIN) 6 MG tablet [id number]  Take 6 mg by mouth as directed. Daily [place] Scrape A, NP  Active             Patient Active Problem List   Diagnosis Date Noted   Acute infection of nasal sinus 01/24/2022   Joint pain 10/24/2021   Muscle pain 10/24/2021   Other fatigue 10/24/2021   Hypertensive kidney disease with stage 3a chronic kidney disease ([place]) 06/27/2021   Acquired thrombophilia ([place]) 06/27/2021   Osteopenia 06/27/2021   Mixed  hyperlipidemia 06/27/2021   Class 1 obesity due to excess calories with serious comorbidity and body mass index (BMI) of 33.0 to 33.9 in adult 06/27/2021   Type 2 diabetes mellitus with diabetic neuropathy, with long-term current use of insulin ([place]) 06/15/2021   Closed fracture of fourth metatarsal bone 05/07/2021   Anemia in chronic kidney disease 02/03/2021   Type 2 diabetes mellitus with vascular disease ([place]) 12/09/2020   Chronic diastolic congestive heart failure ([place]) 12/09/2020   Type 2 diabetes mellitus with stage 3 chronic kidney disease ([place]) 12/09/2020   Asthma    Chronic constipation    DJD (degenerative joint disease)    GERD (gastroesophageal reflux disease)    Heart murmur    Sleep apnea    Solitary pulmonary nodule on lung CT 02/06/2019   Spondylolisthesis, lumbar region 08/09/2018   Osteoarthritis of right knee 07/06/2018   Atrial fibrillation [I48.91] 10/01/2014   Protein S deficiency ([place]) 03/03/2013   DOE (dyspnea on exertion) 03/03/2013   Ventricular premature beats 05/23/2011   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, NYHA class 1 (Level Green) 05/23/2011    Immunization History  Administered Date(s) Administered   Fluad Quad(high Dose 65+) 05/26/2020, 05/13/2021   Influenza,inj,Quad PF,6+ Mos 06/04/2013   PFIZER Comirnaty(Gray Top)Covid-19 Tri-Sucrose Vaccine 02/04/2021   PFIZER(Purple Top)SARS-COV-2 Vaccination 09/22/2019, 10/12/2019, 06/16/2020   PNEUMOCOCCAL CONJUGATE-20 02/15/2022   Pfizer Covid-19 Vaccine Bivalent Booster 25yr & up 08/17/2021   Pneumococcal Conjugate-13 07/30/2014   Pneumococcal Polysaccharide-23 03/31/2008   Tdap 11/14/2013   Zoster, Live 03/31/2008, 06/03/2011    Conditions to be addressed/monitored:  Hypertension, Hyperlipidemia and Diabetes  Care Plan : ccm pharmacy care plan  Updates made by KLane Hacker RLulingsince 03/23/2022 12:00 AM     Problem: htn, hld, dm   Priority: High  Onset Date: 01/01/2021      Long-Range Goal: Disease State Management   Start Date: 01/01/2021  Expected End Date: 01/01/2022  Recent Progress: On track  Priority: High  Note:   Current Barriers:  Unable to achieve control of low blood sugar   Pharmacist Clinical Goal(s):  Patient will achieve control of diabetes as evidenced by a1c and blood sugar through collaboration with PharmD and provider.   Interventions: 1:1 collaboration with CRochel Brome MD regarding development and update of comprehensive plan of care as evidenced by provider attestation and co-signature Inter-disciplinary care team collaboration (  see longitudinal plan of care) Comprehensive medication review performed; medication list updated in electronic medical record  Hypertension (BP goal <130/80) BP Readings from Last 3 Encounters:  02/15/22 102/60  02/03/22 (!) 188/79  01/24/22 128/60  -Controlled -Current treatment: Lisinopril 45m Appropriate, Effective, Safe, Accessible Amlodipine 5101mAppropriate, Effective, Safe, Accessible Furosemide 40 mg QD Appropriate, Effective, Safe, Accessible -Medications previously tried:  valsartan -Current home readings:  July 2023: Didn't have list on her -Current dietary habits:   Breakfast: Boiled egg, piece of toast, and coffee Lunch: Salad and a few crackers for  Dinner: 2 Small slices of pizza yesterday -Salsbury streak, 1/3 cup hashbrowns, lettuce/tomato salad, and iced tea (unsweet) -Current exercise habits: limited exercise  -Denies hypotensive/hypertensive symptoms -Educated on BP goals and benefits of medications for prevention of heart attack, stroke and kidney damage; Daily salt intake goal < 2300 mg; Importance of home blood pressure monitoring; -Counseled to monitor BP at home daily, document, and provide log at future appointments -Counseled on diet and exercise extensively Recommended to continue therapy  Hyperlipidemia: (LDL goal < 70) The 10-year ASCVD risk score (Arnett DK, et  al., 2019) is: 30.6%   Values used to calculate the score:     Age: [age]     Sex: Female     Is Non-Hispanic African American: No     Diabetic: Yes     Tobacco smoker: No     Systolic Blood Pressure: 10154mHg     Is BP treated: Yes     HDL Cholesterol: 59 mg/dL     Total Cholesterol: 186 mg/dL Lab Results  Component Value Date   CHOL 186 01/24/2022   CHOL 144 10/18/2021   CHOL 148 06/15/2021   Lab Results  Component Value Date   HDL 59 01/24/2022   HDL 72 10/18/2021   HDL 69 06/15/2021   Lab Results  Component Value Date   LDLCALC 111 (H) 01/24/2022   LDLCALC 59 10/18/2021   LDLCALC 66 06/15/2021   Lab Results  Component Value Date   TRIG 87 01/24/2022   TRIG 64 10/18/2021   TRIG 65 06/15/2021   Lab Results  Component Value Date   CHOLHDL 3.2 01/24/2022   CHOLHDL 2.0 10/18/2021   CHOLHDL 2.1 06/15/2021  No results found for: "LDLDIRECT" -Controlled -Current treatment: atorvastatin 80 mg daily Appropriate, Effective, Safe, Accessible ASA 814mppropriate, Effective, Safe, Accessible -Medications previously tried: none reported  -Current dietary patterns: working on stabilizing blood sugar  -Current exercise habits: limited due to pain -Educated on Cholesterol goals;  Benefits of statin for ASCVD risk reduction; -Counseled on diet and exercise extensively Recommended to continue current medication  Diabetes (A1c goal <7%) Lab Results  Component Value Date   HGBA1C 7.2 (H) 01/24/2022   HGBA1C 7.4 (H) 10/18/2021   HGBA1C 7.5 (H) 06/15/2021   Lab Results  Component Value Date   MICROALBUR 10 06/15/2021   LDLCALC 111 (H) 01/24/2022   CREATININE 1.0 02/03/2022   Lab Results  Component Value Date   NA 138 02/03/2022   K 4.6 02/03/2022   CREATININE 1.0 02/03/2022   EGFR 45 (L) 01/24/2022   GFRNONAA 61 09/11/2020   GLUCOSE 106 (H) 01/24/2022   Lab Results  Component Value Date   WBC 6.9 02/03/2022   HGB 13.0 02/03/2022   HCT 40 02/03/2022    MCV 91 01/24/2022   PLT 311 02/03/2022  -Not ideally controlled -Current medications: freestyle libre 2  novolog units before meals Appropriate, Query effective,  SSI 90-150 4 U 151-250 +1 251-300 +2 301-350 +3 351- 400 +4 >400 +5 Toujeo 10 units daily Appropriate, Query effective,  -Medications previously tried: trulicity, metformin, actoplus met, Semaglutide (Had reaction to it in March but wouldn't say what) -Current home glucose readings Glucose: (See Libre for more) 03/21/22: 179, 242, 325, 245, 174, 245, 212, 253 03/20/22: 131, 245, 355, 143, 244, 313 -Reports hypoglycemic/hyperglycemic symptoms -Current dietary habits:   July 2023: Breakfast: Boiled egg, piece of toast, and coffee Lunch: Salad and a few crackers for  Dinner: 2 Small slices of pizza yesterday -[place] streak, 1/3 cup hashbrowns, lettuce/tomato salad, and iced tea (unsweet) -Current exercise: limited -Educated on A1c and blood sugar goals; Complications of diabetes including kidney damage, retinal damage, and cardiovascular disease; Prevention and management of hypoglycemic episodes; Continuous glucose monitoring; Carbohydrate counting and/or plate method -Counseled to check feet daily and get yearly eye exams -Counseled on diet and exercise extensively July 2023: Sugars are good in AM and then high starting around 1000. Looked up log on [facility] site. Will let Endo know. Patient states she called Endo but they only told her to adjust her diet (Which has carbs in it for every meal). Will let Endo know again  Insomnia (Goal: 7-8 hours of sleep/night) -Uncontrolled -Patient currently getting 5 hours of sleep/night -Patient currently waking up 2-3 times/night -Patient's concern is Staying asleep -Current treatment: N/A -Medications previously tried: Trazodone, Melatonin -Counseled on sleep hygiene techniques (consistent sleep/wake up schedule, no electronic screens 1-2 hours before bed, etc) July 2023: Will  ask PCP about sleep therapy. Used to be on Trazodone (Was stopped around when her Duloxetine was stopped. Duloxetine was stopped due to "blackouts" but she states she never had this issue with Trazodone) and though it is generally for Latency, patient stated this helped. Will ask PCP to assess    Patient Goals/Self-Care Activities Patient will:  - take medications as prescribed focus on medication adherence by using pill box check glucose with Pamela [name] throughout the day, document, and provide at future appointments check blood pressure daily, document, and provide at future appointments  Follow Up Plan: Telephone follow up appointment with care management team member scheduled for: 09/23  [place] Constable, Pharm.D. - [phone]        Medication Assistance: None required.  Patient affirms current coverage meets needs.  Patient's preferred pharmacy is:  [facility] [place], [place] AT [address] [address] Phone: [phone] Fax: [place], [place] [address] [place] [address] Phone: [phone] Fax: [phone] - [place], [place] AT [facility] OF [address] [place] [address] [place] [address] Phone: [phone] Fax: [phone]   Uses pill box? Yes Pt endorses 100% compliance  We discussed: Current pharmacy is preferred with insurance plan and patient is satisfied with pharmacy services Patient decided to: Continue current medication management strategy  Care Plan and Follow Up Patient Decision:  Patient agrees to Care Plan and Follow-up.  Plan: Telephone follow up appointment with care management team member scheduled for:  09/23

## 2022-03-23 NOTE — Telephone Encounter (Signed)
Called to confirm, INR 2.5 on 03/21/22. She continues same dose of Coumadin 6 mg Sun, M,T,W and '5mg'$  Th, F, Sat. Will repeat home INR on 03/28/22.

## 2022-03-23 NOTE — Patient Instructions (Signed)
Visit Information   Goals Addressed   None    Patient Care Plan: ccm pharmacy care plan     Problem Identified: htn, hld, dm   Priority: High  Onset Date: 01/01/2021     Long-Range Goal: Disease State Management   Start Date: 01/01/2021  Expected End Date: 01/01/2022  Recent Progress: On track  Priority: High  Note:   Current Barriers:  Unable to achieve control of low blood sugar   Pharmacist Clinical Goal(s):  Patient will achieve control of diabetes as evidenced by a1c and blood sugar through collaboration with PharmD and provider.   Interventions: 1:1 collaboration with Cox, Elnita Maxwell, MD regarding development and update of comprehensive plan of care as evidenced by provider attestation and co-signature Inter-disciplinary care team collaboration (see longitudinal plan of care) Comprehensive medication review performed; medication list updated in electronic medical record  Hypertension (BP goal <130/80) BP Readings from Last 3 Encounters:  02/15/22 102/60  02/03/22 (!) 188/79  01/24/22 128/60  -Controlled -Current treatment: Lisinopril 63m Appropriate, Effective, Safe, Accessible Amlodipine 540mAppropriate, Effective, Safe, Accessible Furosemide 40 mg QD Appropriate, Effective, Safe, Accessible -Medications previously tried:  valsartan -Current home readings:  July 2023: Didn't have list on her -Current dietary habits:   Breakfast: Boiled egg, piece of toast, and coffee Lunch: Salad and a few crackers for  Dinner: 2 Small slices of pizza yesterday -[place] streak, 1/3 cup hashbrowns, lettuce/tomato salad, and iced tea (unsweet) -Current exercise habits: limited exercise  -Denies hypotensive/hypertensive symptoms -Educated on BP goals and benefits of medications for prevention of heart attack, stroke and kidney damage; Daily salt intake goal < 2300 mg; Importance of home blood pressure monitoring; -Counseled to monitor BP at home daily, document, and provide log  at future appointments -Counseled on diet and exercise extensively Recommended to continue therapy  Hyperlipidemia: (LDL goal < 70) The 10-year ASCVD risk score (Arnett DK, et al., 2019) is: 30.6%   Values used to calculate the score:     Age: [age]     Sex: Female     Is Non-Hispanic African American: No     Diabetic: Yes     Tobacco smoker: No     Systolic Blood Pressure: 10024mHg     Is BP treated: Yes     HDL Cholesterol: 59 mg/dL     Total Cholesterol: 186 mg/dL Lab Results  Component Value Date   CHOL 186 01/24/2022   CHOL 144 10/18/2021   CHOL 148 06/15/2021   Lab Results  Component Value Date   HDL 59 01/24/2022   HDL 72 10/18/2021   HDL 69 06/15/2021   Lab Results  Component Value Date   LDLCALC 111 (H) 01/24/2022   LDLCALC 59 10/18/2021   LDLCALC 66 06/15/2021   Lab Results  Component Value Date   TRIG 87 01/24/2022   TRIG 64 10/18/2021   TRIG 65 06/15/2021   Lab Results  Component Value Date   CHOLHDL 3.2 01/24/2022   CHOLHDL 2.0 10/18/2021   CHOLHDL 2.1 06/15/2021  No results found for: "LDLDIRECT" -Controlled -Current treatment: atorvastatin 80 mg daily Appropriate, Effective, Safe, Accessible ASA 8137mppropriate, Effective, Safe, Accessible -Medications previously tried: none reported  -Current dietary patterns: working on stabilizing blood sugar  -Current exercise habits: limited due to pain -Educated on Cholesterol goals;  Benefits of statin for ASCVD risk reduction; -Counseled on diet and exercise extensively Recommended to continue current medication  Diabetes (A1c goal <7%) Lab Results  Component Value Date  HGBA1C 7.2 (H) 01/24/2022   HGBA1C 7.4 (H) 10/18/2021   HGBA1C 7.5 (H) 06/15/2021   Lab Results  Component Value Date   MICROALBUR 10 06/15/2021   LDLCALC 111 (H) 01/24/2022   CREATININE 1.0 02/03/2022   Lab Results  Component Value Date   NA 138 02/03/2022   K 4.6 02/03/2022   CREATININE 1.0 02/03/2022   EGFR 45  (L) 01/24/2022   GFRNONAA 61 09/11/2020   GLUCOSE 106 (H) 01/24/2022   Lab Results  Component Value Date   WBC 6.9 02/03/2022   HGB 13.0 02/03/2022   HCT 40 02/03/2022   MCV 91 01/24/2022   PLT 311 02/03/2022  -Not ideally controlled -Current medications: freestyle libre 2  novolog units before meals Appropriate, Query effective,  SSI 90-150 4 U 151-250 +1 251-300 +2 301-350 +3 351- 400 +4 >400 +5 Toujeo 10 units daily Appropriate, Query effective,  -Medications previously tried: trulicity, metformin, actoplus met, Semaglutide (Had reaction to it in March but wouldn't say what) -Current home glucose readings Glucose: (See Libre for more) 03/21/22: 179, 242, 325, 245, 174, 245, 212, 253 03/20/22: 131, 245, 355, 143, 244, 313 -Reports hypoglycemic/hyperglycemic symptoms -Current dietary habits:   July 2023: Breakfast: Boiled egg, piece of toast, and coffee Lunch: Salad and a few crackers for  Dinner: 2 Small slices of pizza yesterday -[place] streak, 1/3 cup hashbrowns, lettuce/tomato salad, and iced tea (unsweet) -Current exercise: limited -Educated on A1c and blood sugar goals; Complications of diabetes including kidney damage, retinal damage, and cardiovascular disease; Prevention and management of hypoglycemic episodes; Continuous glucose monitoring; Carbohydrate counting and/or plate method -Counseled to check feet daily and get yearly eye exams -Counseled on diet and exercise extensively July 2023: Sugars are good in AM and then high starting around 1000. Looked up log on [place] site. Will let Endo know. Patient states she called Endo but they only told her to adjust her diet (Which has carbs in it for every meal). Will let Endo know again  Insomnia (Goal: 7-8 hours of sleep/night) -Uncontrolled -Patient currently getting 5 hours of sleep/night -Patient currently waking up 2-3 times/night -Patient's concern is Staying asleep -Current treatment: N/A -Medications  previously tried: Trazodone, Melatonin -Counseled on sleep hygiene techniques (consistent sleep/wake up schedule, no electronic screens 1-2 hours before bed, etc) July 2023: Will ask PCP about sleep therapy. Used to be on Trazodone (Was stopped around when her Duloxetine was stopped. Duloxetine was stopped due to "blackouts" but she states she never had this issue with Trazodone) and though it is generally for Latency, patient stated this helped. Will ask PCP to assess    Patient Goals/Self-Care Activities Patient will:  - take medications as prescribed focus on medication adherence by using pill box check glucose with [name] throughout the day, document, and provide at future appointments check blood pressure daily, document, and provide at future appointments  Follow Up Plan: Telephone follow up appointment with care management team member scheduled for: 09/23  [place] Constable, Pharm.D. - [phone]      Ms. Rewis was given information about Chronic Care Management services today including:  CCM service includes personalized support from designated clinical staff supervised by her physician, including individualized plan of care and coordination with other care providers 24/7 contact phone numbers for assistance for urgent and routine care needs. Standard insurance, coinsurance, copays and deductibles apply for chronic care management only during months in which we provide at least 20 minutes of these services. Most insurances cover these services at  100%, however patients may be responsible for any copay, coinsurance and/or deductible if applicable. This service may help you avoid the need for more expensive face-to-face services. Only one practitioner may furnish and bill the service in a calendar month. The patient may stop CCM services at any time (effective at the end of the month) by phone call to the office staff.  Patient agreed to services and verbal consent obtained.   The  patient verbalized understanding of instructions, educational materials, and care plan provided today and DECLINED offer to receive copy of patient instructions, educational materials, and care plan.  The pharmacy team will reach out to the patient again over the next 60 days.   Lane Hacker, Gem

## 2022-03-28 ENCOUNTER — Other Ambulatory Visit: Payer: Self-pay | Admitting: Hematology and Oncology

## 2022-03-28 NOTE — Progress Notes (Signed)
Coordinated with Elissa Lovett to get Trazodone sent in per msg from [name]. Called patient and she wanted to try Trazodone again

## 2022-03-29 NOTE — Progress Notes (Unsigned)
Name: Rasheeda Mulvehill  MRN/ DOB: [id number], 25-Apr-1946   Age/ Sex: 76 y.o., female    PCP: [name], MD   Reason for Endocrinology Evaluation: Type 2 Diabetes Mellitus     Date of Initial Endocrinology Visit: 7/26/20231     PATIENT IDENTIFIER: Ms. Eloni Darius is a 76 y.o. female with a past medical history of T2DM, GERD, A. Fib . The patient presented for initial endocrinology clinic visit on 03/30/2022 for consultative assistance with her diabetes management.    HPI: Ms. Shadix was    Diagnosed with DM in 1985 Prior Medications tried/Intolerance: Metformin - tired . She was not able to get the Ozempic due to shortage . Rybelsus caused nausea  Currently checking blood sugars multiple x / day through CGM  Hypoglycemia episodes : yes               Symptoms: yes                 Frequency: rare Hemoglobin A1c has ranged from 7.2% in 2023, peaking at 9.8% in 2022.   In terms of diet, the patient eats 3 meals a day. Snacks 2x a day . Avoids sugar sweetened beverages    Of note, the pt was seen by Dr. Loanne Drilling in 2021 for hyponatremia . Adrenal insufficiency as excluded   Has occasional diarrhea  Has recurrent UTI's , on average once every 3 months She is getting ready to have spinal injections, which she understands will cause hypoglycemia She is also getting ready to have a wrist surgery    HOME DIABETES REGIMEN: Toujeo  10 units daily  Novolog  90-150 = 2 151- 250= 3 251- 300 = 4 301- 350 = 5    Statin: yes ACE-I/ARB: yes    GLUCOSE LOG:  68-400     DIABETIC COMPLICATIONS: Microvascular complications:  Macular degeneration , CKD III Denies:  Last eye exam: Completed 09/2021  Macrovascular complications:   Denies: CAD, PVD, CVA   PAST HISTORY: Past Medical History:  Past Medical History:  Diagnosis Date   Anticoagulated on Coumadin    chronic--- managed by hematology/ oncology,   Asthma, mild intermittent     followed by pcp--- per pt last exacerbation winter 2021 w/ acute bronchitis   Bilateral leg cramps    Chronic constipation    CKD (chronic kidney disease), stage III ([place])    Closed bimalleolar fracture of left ankle 02/26/2020   DDD (degenerative disc disease), cervical    w/ spondylosis,  per pt last steroid injection 06/ 2022   DDD (degenerative disc disease), lumbosacral    DVT (deep venous thrombosis) ([place]) 07/30/2013   Dyspnea    occasionally   GERD (gastroesophageal reflux disease)    History of cardiac murmur as a child    History of DVT of lower extremity    left lower extremity in 1980s, fell when bowling   History of pulmonary embolus (PE) 1993   per pt left lung post op 2 wks cholecystectomy   History of rheumatic fever as a child    per last echo 12-31-2019 no valvular issues   History of TIA (transient ischemic attack)    2014 and 2018 or 2019,  per pt no residual   HTN (hypertension)    followed by pcp   Hyperlipidemia    IDA (iron deficiency anemia)    Macular degeneration of both eyes    Mild obstructive sleep apnea    per pt dx  2017 tried to uses cpap but intolerant   Mixed incontinence urge and stress    urologist--- dr Nila Nephew   OA (osteoarthritis) 07/06/2018   Osteoporosis    PAF (paroxysmal atrial fibrillation) ([place]) 10/01/2014   cardiologist--- dr Godfrey Pick tobb;   cardiac cath 02-18-2013 normal coronaries arteries, ef 50%, cath done since echo showed ef 30-35%; nucleat stress study 04/ 2020 normal , normal echo 04/ 2021,  event monitor 09-14-2020 rare ST/AT variable block   PONV (postoperative nausea and vomiting)    Protein S deficiency ([place])    followed by hemotology/ oncology-- dr d. Bobby Rumpf ([facility]) dx 1980s;  prior DVT left lower leg 1980s and left lung PE 1993; chronic  coumadin since 1980s   PVC's (premature ventricular contractions)    followed by cardiology   S/P cardiac catheterization 02/2013   Normal coronaries; low normal EF at  50%   Solitary pulmonary nodule on lung CT 02/06/2019   First noted 01/13/2014 > no change as of 12/21/2018   Spondylolisthesis, lumbar region 08/09/2018   Stroke ([place])    TIA in 2018 or 2019   Transient ischemic attack 07/30/2013   Type 2 diabetes mellitus treated with insulin ([place])    endocrilogist--- whitney reardon NP     (03-10-2021  pt continuously checks blood sugar throughout the day w/ Libre, fasting sugar --- 69--200)   Wears glasses    Wears hearing aid in both ears    Past Surgical History:  Past Surgical History:  Procedure Laterality Date   ABDOMINAL HYSTERECTOMY     BLEPHAROPLASTY     CARDIAC CATHETERIZATION  02/18/2013   @ [facility]  by Dr Martinique;  normal coronaries w/ preserved LVF, ef 50%;   previous cath 2001 normal ef 65%   CARPAL TUNNEL RELEASE Bilateral 1994   CATARACT EXTRACTION W/ INTRAOCULAR LENS IMPLANT Bilateral 2017   CHOLECYSTECTOMY, LAPAROSCOPIC  1993   COLONOSCOPY     EAR BIOPSY Left    FINGER SURGERY Left 2018   thumb   FOOT SURGERY     FOOT TENDON SURGERY Right    early 2000s   HAND SURGERY     HARDWARE REMOVAL Left 03/12/2021   Procedure: HARDWARE REMOVAL;  Surgeon: Nicholes Stairs, MD;  Location: [facility];  Service: Orthopedics;  Laterality: Left;  60 MINS   ORIF ANKLE FRACTURE Left 02/26/2020   Procedure: OPEN REDUCTION INTERNAL FIXATION (ORIF) ANKLE FRACTURE;  Surgeon: Nicholes Stairs, MD;  Location: [facility];  Service: Orthopedics;  Laterality: Left;  90 mins   TONSILLECTOMY AND ADENOIDECTOMY Bilateral    TOTAL KNEE ARTHROPLASTY Left 03/2005   TOTAL VAGINAL HYSTERECTOMY  1988   per pt still has ovaries    Social History:  reports that she has never smoked. She has never used smokeless tobacco. She reports that she does not drink alcohol and does not use drugs. Family History:  Family History  Problem Relation Age of Onset   Cirrhosis Mother    Antithrombin III deficiency Mother        multiple emboli   Ulcerative  colitis Mother    Diabetes Father    Coronary artery disease Father    Heart attack Father    Hypertension Father    Kidney disease Father    Hypertension Sister    Diabetes Sister    Heart attack Sister        age 26    Heart attack Brother    Hypertension Brother  Lung cancer Brother    Protein S deficiency Daughter    Stroke Neg Hx    Colitis Neg Hx    Colon polyps Neg Hx    Esophageal cancer Neg Hx    Liver cancer Neg Hx    Pancreatic cancer Neg Hx    Rectal cancer Neg Hx    Stomach cancer Neg Hx      HOME MEDICATIONS: Allergies as of 03/30/2022       Reactions   Ketek [telithromycin] Nausea And Vomiting, Rash   Loxapine Succinate Hives   Naproxen Sodium Anaphylaxis, Hives   Amoxapine And Related Hives   Darvon Nausea And Vomiting   Belsomra [suvorexant] Other (See Comments)   "Nightmares"   Cymbalta [duloxetine Hcl] Other (See Comments)   "Blackouts"   Duloxetine    Gabapentin Other (See Comments)   Blurry vision   Lyrica [pregabalin]    Makes her sedated   Amoxicillin Rash   Propoxyphene Nausea And Vomiting        Medication List        Accurate as of March 30, 2022 12:51 PM. If you have any questions, ask your nurse or doctor.          STOP taking these medications    FreeStyle Libre 2 Reader Kerrin Mo Stopped by: Dorita Sciara, MD   Semaglutide(0.25 or 0.'5MG'$ /DOS) 2 MG/1.5ML Sopn Stopped by: Dorita Sciara, MD       TAKE these medications    albuterol 108 (90 Base) MCG/ACT inhaler Commonly known as: VENTOLIN HFA INHALE 1 TO 2 PUFFS INTO THE LUNGS EVERY 6 HOURS AS NEEDED FOR WHEEZING OR SHORTNESS OF BREATH   amLODipine 5 MG tablet Commonly known as: NORVASC TAKE 1 TABLET(5 MG) BY MOUTH DAILY   Aspirin Low Dose 81 MG tablet Generic drug: aspirin EC TAKE 1 TABLET(81 MG) BY MOUTH DAILY   atorvastatin 80 MG tablet Commonly known as: LIPITOR Take 1 tablet (80 mg total) by mouth daily.   azelastine 0.1 % nasal  spray Commonly known as: ASTELIN Place 2 sprays into both nostrils daily as needed for rhinitis or allergies.   B-complex with vitamin C tablet Take 1 tablet by mouth daily with lunch.   BD Pen Needle Nano 2nd Gen 32G X 4 MM Misc Generic drug: Insulin Pen Needle USE DAILY AS DIRECTED   CALCIUM CITRATE + PO Take by mouth.   cefdinir 300 MG capsule Commonly known as: OMNICEF Take 1 capsule (300 mg total) by mouth 2 (two) times daily.   dapagliflozin propanediol 5 MG Tabs tablet Commonly known as: Farxiga Take 1 tablet (5 mg total) by mouth daily before breakfast. Started by: Dorita Sciara, MD   estradiol 0.1 MG/GM vaginal cream Commonly known as: ESTRACE Place 1 Applicatorful vaginally 2 (two) times a week. Uses twice weekly   ferrous sulfate 325 (65 FE) MG tablet Take 325 mg by mouth daily with lunch.   FreeStyle Libre 2 Sensor Misc 2 each by Does not apply route every 14 (fourteen) days. E11.40   furosemide 40 MG tablet Commonly known as: LASIX Take 1 tablet (40 mg total) by mouth daily.   GEMTESA PO Take 75 mg by mouth at bedtime.   Gemtesa 75 MG Tabs Generic drug: Vibegron Take by mouth.   Insulin Aspart FlexPen 100 UNIT/ML Commonly known as: NOVOLOG Max daily 30 units What changed:  how much to take how to take this when to take this additional instructions Changed by: Secundino Ginger Tongela Encinas,  MD   insulin glargine (1 Unit Dial) 300 UNIT/ML Solostar Pen Commonly known as: TOUJEO Inject 10 Units into the skin at bedtime.   ipratropium-albuterol 0.5-2.5 (3) MG/3ML Soln Commonly known as: DUONEB Take 3 mLs by nebulization 4 (four) times daily as needed (wheezing/shortness of breath).   isosorbide mononitrate 30 MG 24 hr tablet Commonly known as: IMDUR Take 1 tablet (30 mg total) by mouth daily.   lisinopril 20 MG tablet Commonly known as: ZESTRIL TAKE 2 TABLETS(40 MG) BY MOUTH TWICE DAILY   magnesium oxide 400 (240 Mg) MG tablet Commonly  known as: MAG-OX TAKE 2 TABLETS(800 MG) BY MOUTH TWICE DAILY What changed: See the new instructions.   ondansetron 8 MG disintegrating tablet Commonly known as: ZOFRAN-ODT Take 8 mg by mouth every 8 (eight) hours as needed for nausea or vomiting.   pantoprazole 40 MG tablet Commonly known as: PROTONIX Take 1 tablet (40 mg total) by mouth every morning.   polyethylene glycol 17 g packet Commonly known as: MIRALAX / GLYCOLAX Take 17 g by mouth as needed. Uses 1-2 times weekly   PRESERVISION AREDS 2 PO Take by mouth.   sodium chloride 0.65 % Soln nasal spray Commonly known as: OCEAN Place 1-2 sprays into both nostrils 4 (four) times daily as needed for congestion.   Vitamin D3 50 MCG (2000 UT) Tabs Take 4,000 Units by mouth daily with lunch.   warfarin 6 MG tablet Commonly known as: COUMADIN Take as directed by the anticoagulation clinic. If you are unsure how to take this medication, talk to your nurse or doctor. Original instructions: Take 6 mg by mouth as directed. Daily   warfarin 5 MG tablet Commonly known as: COUMADIN Take as directed by the anticoagulation clinic. If you are unsure how to take this medication, talk to your nurse or doctor. Original instructions: Take 5 mg by mouth as directed. On Tues, Thur, Sat, and Sunday   warfarin 5 MG tablet Commonly known as: COUMADIN Take as directed by the anticoagulation clinic. If you are unsure how to take this medication, talk to your nurse or doctor. Original instructions: TAKE 1 TABLET BY MOUTH EVERY DAY   warfarin 1 MG tablet Commonly known as: COUMADIN Take as directed by the anticoagulation clinic. If you are unsure how to take this medication, talk to your nurse or doctor. Original instructions: Take 1 tablet (1 mg total) by mouth as directed.         ALLERGIES: Allergies  Allergen Reactions   Ketek [Telithromycin] Nausea And Vomiting and Rash   Loxapine Succinate Hives   Naproxen Sodium Anaphylaxis and  Hives   Amoxapine And Related Hives   Darvon Nausea And Vomiting   Belsomra [Suvorexant] Other (See Comments)    "Nightmares"   Cymbalta [Duloxetine Hcl] Other (See Comments)    "Blackouts"   Duloxetine    Gabapentin Other (See Comments)    Blurry vision   Lyrica [Pregabalin]     Makes her sedated   Amoxicillin Rash   Propoxyphene Nausea And Vomiting     REVIEW OF SYSTEMS: A comprehensive ROS was conducted with the patient and is negative except as per HPI     OBJECTIVE:   VITAL SIGNS: BP (!) 150/62 (BP Location: Left Arm, Patient Position: Sitting, Cuff Size: Normal)   Pulse 73   Ht 5' (1.524 m)   Wt 174 lb 3.2 oz (79 kg)   SpO2 93%   BMI 34.02 kg/m    PHYSICAL EXAM:  General: Pt  appears well and is in NAD  Neck: General: Supple without adenopathy or carotid bruits. Thyroid: Thyroid size normal.  No goiter or nodules appreciated.  Lungs: Clear with good BS bilat with no rales, rhonchi, or wheezes  Heart: RRR   Extremities:  Lower extremities - No pretibial edema. No lesions.  Neuro: MS is good with appropriate affect, pt is alert and Ox3     DATA REVIEWED:  Lab Results  Component Value Date   HGBA1C 7.2 (H) 01/24/2022   HGBA1C 7.4 (H) 10/18/2021   HGBA1C 7.5 (H) 06/15/2021   Lab Results  Component Value Date   MICROALBUR 10 06/15/2021   LDLCALC 111 (H) 01/24/2022   CREATININE 1.0 02/03/2022   Lab Results  Component Value Date   MICRALBCREAT 16 10/18/2021    Lab Results  Component Value Date   CHOL 186 01/24/2022   HDL 59 01/24/2022   LDLCALC 111 (H) 01/24/2022   TRIG 87 01/24/2022   CHOLHDL 3.2 01/24/2022        Latest Reference Range & Units 02/03/22 00:00  Sodium 137 - 147  138 (E)  Potassium 3.5 - 5.1 mEq/L 4.6 (E)  Chloride 99 - 108  100 (E)  CO2 13 - 22  31 ! (E)  Glucose  183 (E)  BUN 4 - 21  18 (E)  Creatinine 0.5 - 1.1  1.0 (E)  Calcium 8.7 - 10.7  8.9 (E)  Alkaline Phosphatase 25 - 125  63 (E)  Albumin 3.5 - 5.0  4.2 (E)  AST  13 - 35  31 (E)  ALT 7 - 35 U/L 25 (E)  Bilirubin, Total  0.5 (E)  !: Data is abnormal (E): External lab result   ASSESSMENT / PLAN / RECOMMENDATIONS:   1) Type 2 Diabetes Mellitus, Sub-optimally controlled, With CKD III complications - Most recent A1c of 7.2 %. Goal A1c < 7.0 %.    -Ms. Harlow Mares brought her glucose logs which show variable glucose readings between 68 and 400 mg/DL -She is intolerant to Rybelsus due to nausea, she did not have any side effects to the Ozempic per se but there was shortage in supply -Today we discussed SGLT2 inhibitors of having weight loss benefits as well as cardiovascular and renal benefits, I did caution her against genital infections, she already has a UTI once every 3 months and I have asked her to keep an eye on this and see if they worsen or if they remain stable -Discussed pharmacokinetics of basal/bolus insulin and the importance of taking prandial insulin with meals.  We also discussed avoiding sugar-sweetened beverages and snacks, when possible.  -We discussed that the variability of her glucose reading throughout the day is due to to snacking, I have advised her to avoid snacks if possible or choose no carb snacks if possible -I am going to adjust her insulin regimen as below -She will also be provided with a correction scale, slightly different than the current one that she has -Unable to download CGM today    MEDICATIONS: Start Farxiga 5 mg daily Continue Toujeo 10 units daily NovoLog 3 units 3 times daily before every meal Correction factor : NovoLog (BG -120/50)  EDUCATION / INSTRUCTIONS: BG monitoring instructions: Patient is instructed to check her blood sugars 3 times a day, before meals. Call [facility] if: BG persistently < 70  I reviewed the Rule of 15 for the treatment of hypoglycemia in detail with the patient. Literature supplied.   2) Diabetic complications:  Eye: Does not have known diabetic retinopathy.   Neuro/ Feet: Does not have known diabetic peripheral neuropathy. Renal: Patient does not have known baseline CKD. She is  on an ACEI/ARB at present.   Follow-up in 3 months     Signed electronically by: Mack Guise, MD  [facility]  [facility] [address] [place], [place] [address] Phone: [phone] FAX: [phone]   CC: [name], MD [address] [place] [address] Phone: [phone]  Fax: [phone]    Return to Endocrinology clinic as below: Future Appointments  Date Time Provider [place]  04/04/2022  9:20 AM GI-[facility]G MOBILE MM 1 GI-BCGMO GI-BREAST CE  04/11/2022 10:00 AM Brita Romp, NP REA-REA None  05/02/2022  8:40 AM Cox, Elnita Maxwell, MD [facility]O None  05/26/2022 10:00 AM [facility] CCM PHARMACIST [facility] None  02/06/2023  9:30 AM [facility]-LAB [facility] None  02/06/2023 10:00 AM Marice Potter, MD [facility] None

## 2022-03-30 ENCOUNTER — Ambulatory Visit (INDEPENDENT_AMBULATORY_CARE_PROVIDER_SITE_OTHER): Payer: Medicare PPO | Admitting: Internal Medicine

## 2022-03-30 ENCOUNTER — Encounter: Payer: Self-pay | Admitting: Internal Medicine

## 2022-03-30 ENCOUNTER — Other Ambulatory Visit: Payer: Self-pay

## 2022-03-30 VITALS — BP 150/62 | HR 73 | Ht 60.0 in | Wt 174.2 lb

## 2022-03-30 DIAGNOSIS — E1122 Type 2 diabetes mellitus with diabetic chronic kidney disease: Secondary | ICD-10-CM

## 2022-03-30 DIAGNOSIS — N1832 Chronic kidney disease, stage 3b: Secondary | ICD-10-CM | POA: Diagnosis not present

## 2022-03-30 DIAGNOSIS — Z794 Long term (current) use of insulin: Secondary | ICD-10-CM | POA: Diagnosis not present

## 2022-03-30 LAB — POCT GLUCOSE (DEVICE FOR HOME USE): POC Glucose: 189 mg/dl — AB (ref 70–99)

## 2022-03-30 MED ORDER — INSULIN ASPART FLEXPEN 100 UNIT/ML ~~LOC~~ SOPN: PEN_INJECTOR | SUBCUTANEOUS | 6 refills | Status: DC

## 2022-03-30 MED ORDER — TRAZODONE HCL 50 MG PO TABS: 50.0000 mg | ORAL_TABLET | Freq: Every evening | ORAL | 0 refills | Status: DC | PRN

## 2022-03-30 MED ORDER — DAPAGLIFLOZIN PROPANEDIOL 5 MG PO TABS: 5.0000 mg | ORAL_TABLET | Freq: Every day | ORAL | 1 refills | Status: DC

## 2022-03-30 MED ORDER — INSULIN GLARGINE (1 UNIT DIAL) 300 UNIT/ML ~~LOC~~ SOPN: 10.0000 [IU] | PEN_INJECTOR | Freq: Every day | SUBCUTANEOUS | 3 refills | Status: DC

## 2022-03-30 NOTE — Progress Notes (Signed)
Coordinated with team for refill

## 2022-03-30 NOTE — Patient Instructions (Addendum)
Start farxiga 5 mg , 1 tablet every morning  Continue Toujeo 10 units once daily  Novolog 3 units with each meal  Novolog correctional insulin: ADD extra units on insulin to your meal-time Novolog dose if your blood sugars are higher than 180. Use the scale below to help guide you:   Blood sugar before meal Number of units to inject  Less than 170 0 unit  171 -  220 1 units  221 -  270 2 units  271 -  320 3 units  321 -  370 4 units  371 -  420 5 units  421 -  470 6 units     THE DAY OF THE STEROID INJECTIONS : Take Novolog 4 units with each meal just for 2 days     HOW TO TREAT LOW BLOOD SUGARS (Blood sugar LESS THAN 70 MG/DL) Please follow the RULE OF 15 for the treatment of hypoglycemia treatment (when your (blood sugars are less than 70 mg/dL)   STEP 1: Take 15 grams of carbohydrates when your blood sugar is low, which includes:  3-4 GLUCOSE TABS  OR 3-4 OZ OF JUICE OR REGULAR SODA OR ONE TUBE OF GLUCOSE GEL    STEP 2: RECHECK blood sugar in 15 MINUTES STEP 3: If your blood sugar is still low at the 15 minute recheck --> then, go back to STEP 1 and treat AGAIN with another 15 grams of carbohydrates.

## 2022-03-30 NOTE — Telephone Encounter (Signed)
"  Recommend trazodone 50 mg before bed if it worked previously.  If it did not, I would recommend lunesta 1 mg before bed.  Let us know what she agrees to take. Dr Cox "    Per Lane Hacker, RPH:  Patient prefers Trazadone, please send to pharmacy.

## 2022-04-04 ENCOUNTER — Ambulatory Visit
Admission: RE | Admit: 2022-04-04 | Discharge: 2022-04-04 | Disposition: A | Discharge status: Home / Self Care | Payer: Medicare PPO | Source: Ambulatory Visit | Attending: Family Medicine | Admitting: Family Medicine

## 2022-04-04 ENCOUNTER — Other Ambulatory Visit: Payer: Self-pay | Admitting: Family Medicine

## 2022-04-04 DIAGNOSIS — I129 Hypertensive chronic kidney disease with stage 1 through stage 4 chronic kidney disease, or unspecified chronic kidney disease: Secondary | ICD-10-CM | POA: Diagnosis not present

## 2022-04-04 DIAGNOSIS — E782 Mixed hyperlipidemia: Secondary | ICD-10-CM

## 2022-04-04 DIAGNOSIS — N1831 Chronic kidney disease, stage 3a: Secondary | ICD-10-CM

## 2022-04-04 DIAGNOSIS — Z1231 Encounter for screening mammogram for malignant neoplasm of breast: Secondary | ICD-10-CM | POA: Diagnosis not present

## 2022-04-04 DIAGNOSIS — Z794 Long term (current) use of insulin: Secondary | ICD-10-CM

## 2022-04-04 DIAGNOSIS — E1140 Type 2 diabetes mellitus with diabetic neuropathy, unspecified: Secondary | ICD-10-CM | POA: Diagnosis not present

## 2022-04-05 HISTORY — PX: HAND SURGERY: SHX662

## 2022-04-06 ENCOUNTER — Telehealth: Payer: Self-pay | Admitting: Hematology and Oncology

## 2022-04-06 ENCOUNTER — Telehealth: Payer: Self-pay

## 2022-04-06 NOTE — Progress Notes (Signed)
Chronic Care Management Pharmacy Assistant   Name: Pamela [name]  MRN: [id number] DOB: 1946-07-04   Reason for Encounter: Disease State call for DM    Recent office visits:  None  Recent consult visits:  03/30/22 (Endocrinology) Shamleffer, Melanie Crazier MD. Seen for DM. Started on Farxiga 5 mg daily. D/C Semaglutide 0.'25mg'$ .   Hospital visits:  None  Medications: Outpatient Encounter Medications as of 04/06/2022  Medication Sig   albuterol (VENTOLIN HFA) 108 (90 Base) MCG/ACT inhaler INHALE 1 TO 2 PUFFS INTO THE LUNGS EVERY 6 HOURS AS NEEDED FOR WHEEZING OR SHORTNESS OF BREATH   amLODipine (NORVASC) 5 MG tablet TAKE 1 TABLET(5 MG) BY MOUTH DAILY   ASPIRIN LOW DOSE 81 MG EC tablet TAKE 1 TABLET(81 MG) BY MOUTH DAILY   atorvastatin (LIPITOR) 80 MG tablet Take 1 tablet (80 mg total) by mouth daily.   azelastine (ASTELIN) 0.1 % nasal spray Place 2 sprays into both nostrils daily as needed for rhinitis or allergies.   B Complex-C (B-COMPLEX WITH VITAMIN C) tablet Take 1 tablet by mouth daily with lunch.   Calcium Citrate-Vitamin D (CALCIUM CITRATE + PO) Take by mouth.   cefdinir (OMNICEF) 300 MG capsule Take 1 capsule (300 mg total) by mouth 2 (two) times daily.   Cholecalciferol (VITAMIN D3) 50 MCG (2000 UT) TABS Take 4,000 Units by mouth daily with lunch.   Continuous Blood Gluc Sensor (FREESTYLE LIBRE 2 SENSOR) MISC 2 each by Does not apply route every 14 (fourteen) days. E11.40   dapagliflozin propanediol (FARXIGA) 5 MG TABS tablet Take 1 tablet (5 mg total) by mouth daily before breakfast.   estradiol (ESTRACE) 0.1 MG/GM vaginal cream Place 1 Applicatorful vaginally 2 (two) times a week. Uses twice weekly   ferrous sulfate 325 (65 FE) MG tablet Take 325 mg by mouth daily with lunch.   furosemide (LASIX) 40 MG tablet Take 1 tablet (40 mg total) by mouth daily.   GEMTESA 75 MG TABS Take by mouth.   Insulin Aspart FlexPen (NOVOLOG) 100 UNIT/ML Max daily 30 units    insulin glargine, 1 Unit Dial, (TOUJEO) 300 UNIT/ML Solostar Pen Inject 10 Units into the skin at bedtime.   Insulin Pen Needle (BD PEN NEEDLE NANO 2ND GEN) 32G X 4 MM MISC USE DAILY AS DIRECTED   ipratropium-albuterol (DUONEB) 0.5-2.5 (3) MG/3ML SOLN Take 3 mLs by nebulization 4 (four) times daily as needed (wheezing/shortness of breath).   isosorbide mononitrate (IMDUR) 30 MG 24 hr tablet Take 1 tablet (30 mg total) by mouth daily.   lisinopril (ZESTRIL) 20 MG tablet TAKE 2 TABLETS(40 MG) BY MOUTH TWICE DAILY   magnesium oxide (MAG-OX) 400 (240 Mg) MG tablet TAKE 2 TABLETS(800 MG) BY MOUTH TWICE DAILY (Patient taking differently: Take 800 mg by mouth 2 (two) times daily.)   Multiple Vitamins-Minerals (PRESERVISION AREDS 2 PO) Take by mouth.   ondansetron (ZOFRAN-ODT) 8 MG disintegrating tablet Take 8 mg by mouth every 8 (eight) hours as needed for nausea or vomiting.    pantoprazole (PROTONIX) 40 MG tablet Take 1 tablet (40 mg total) by mouth every morning.   polyethylene glycol (MIRALAX / GLYCOLAX) 17 g packet Take 17 g by mouth as needed. Uses 1-2 times weekly   sodium chloride (OCEAN) 0.65 % SOLN nasal spray Place 1-2 sprays into both nostrils 4 (four) times daily as needed for congestion.   traZODone (DESYREL) 50 MG tablet Take 1 tablet (50 mg total) by mouth at bedtime as needed for  sleep.   Vibegron (GEMTESA PO) Take 75 mg by mouth at bedtime.   warfarin (COUMADIN) 1 MG tablet Take 1 tablet (1 mg total) by mouth as directed.   warfarin (COUMADIN) 5 MG tablet TAKE 1 TABLET BY MOUTH EVERY DAY   warfarin (COUMADIN) 5 MG tablet Take 5 mg by mouth as directed. On Tues, Thur, Sat, and Sunday   warfarin (COUMADIN) 6 MG tablet Take 6 mg by mouth as directed. Daily   No facility-administered encounter medications on file as of 04/06/2022.    Recent Relevant Labs: Lab Results  Component Value Date/Time   HGBA1C 7.2 (H) 01/24/2022 09:05 AM   HGBA1C 7.4 (H) 10/18/2021 08:55 AM   MICROALBUR 10  06/15/2021 04:46 PM   MICROALBUR 10 07/29/2020 12:01 PM    Kidney Function Lab Results  Component Value Date/Time   CREATININE 1.0 02/03/2022 12:00 AM   CREATININE 1.24 (H) 01/24/2022 09:05 AM   CREATININE 1.34 (H) 10/18/2021 08:55 AM   GFR 57.98 (L) 03/10/2020 04:46 PM   GFRNONAA 61 09/11/2020 11:23 AM   GFRAA 70 09/11/2020 11:23 AM     Current antihyperglycemic regimen:  Novolog 3 units before meals (Max daily 30 units)  Toujeo 10 units daily  Farxiga '5mg'$  daily  Patient verbally confirms she is taking the above medications as directed. Yes  What recent interventions/DTPs have been made to improve glycemic control:  Pt changed Endocrinologist with [facility]  Have there been any recent hospitalizations or ED visits since last visit with CPP? No  Patient reports hypoglycemic symptoms, including Sweaty dur to not eating   Patient denies hyperglycemic symptoms, including none  How often are you checking your blood sugar? 3-4 times daily  What are your blood sugars ranging?  Fasting: 04/07/22 70 Before Meals 04/07/22 92 After meal 04/07/22 245 During the night 04/07/22 170, 180  On insulin? Yes How many units:10 units and sliding scale   During the week, how often does your blood glucose drop below 70? Never  Are you checking your feet daily/regularly? Yes  Adherence Review: Is the patient currently on a STATIN medication? Yes Is the patient currently on ACE/ARB medication? Yes Does the patient have >5 day gap between last estimated fill dates? CPP to review  Care Gaps: Last eye exam / Retinopathy Screening? 10/21/21 Last Annual Wellness Visit? 02/15/22 Last Diabetic Foot Exam? 01/24/22   Star Rating Drugs:  Medication:  Last Fill: Day Supply Farxiga   03/30/22 [place], [place] Pharmacist Assistant  [phone]

## 2022-04-06 NOTE — Telephone Encounter (Signed)
Spoke with patient regarding home INR of 3.8 today.  She continues warfarin 6 mg 4 times a week and 5 mg 3 times a week.  She denies any changes in her medication.  She actually has had more vitamin K containing foods this week.  She denies abnormal bleeding.  We will have her hold her warfarin today, then start back on warfarin 6 mg Monday, Wednesday, and Friday and 5 mg the rest of days.  She will repeat an INR in 1 week.  The patient expresses understanding.

## 2022-04-11 ENCOUNTER — Ambulatory Visit: Payer: Medicare PPO | Admitting: Nurse Practitioner

## 2022-04-12 ENCOUNTER — Other Ambulatory Visit: Payer: Self-pay | Admitting: Hematology and Oncology

## 2022-04-12 MED ORDER — ENOXAPARIN SODIUM 80 MG/0.8ML IJ SOSY
80.0000 mg | PREFILLED_SYRINGE | Freq: Two times a day (BID) | INTRAMUSCULAR | 0 refills | Status: DC
Start: 2022-04-14 — End: 2022-04-12

## 2022-04-12 MED ORDER — ENOXAPARIN SODIUM 80 MG/0.8ML IJ SOSY: 80.0000 mg | PREFILLED_SYRINGE | Freq: Two times a day (BID) | INTRAMUSCULAR | 0 refills | Status: DC

## 2022-04-12 NOTE — Progress Notes (Signed)
The patient is having hand surgery on August 14.  Due to her protein S deficiency, we recommend bridging with Lovenox 1 mg/kg twice daily.  Her last dose of Coumadin will be Wednesday evening, so she will start Lovenox on Thursday morning Sunday night dose the evening before her surgery.  She is to resume both Lovenox and Coumadin either Monday night or Tuesday morning as recommended by the surgeon.  She will then repeat an INR on Thursday or Friday.  She understands she is to continue Lovenox until her Coumadin is therapeutic.  The patient verbalizes understanding, by repeating the instructions back to me.

## 2022-04-13 ENCOUNTER — Other Ambulatory Visit: Payer: Self-pay | Admitting: Hematology and Oncology

## 2022-04-13 MED ORDER — ENOXAPARIN SODIUM 80 MG/0.8ML IJ SOSY: 80.0000 mg | PREFILLED_SYRINGE | Freq: Two times a day (BID) | INTRAMUSCULAR | 0 refills | Status: DC

## 2022-04-14 DIAGNOSIS — M5416 Radiculopathy, lumbar region: Secondary | ICD-10-CM | POA: Diagnosis not present

## 2022-04-18 DIAGNOSIS — M1812 Unilateral primary osteoarthritis of first carpometacarpal joint, left hand: Secondary | ICD-10-CM | POA: Diagnosis not present

## 2022-04-18 DIAGNOSIS — M13841 Other specified arthritis, right hand: Secondary | ICD-10-CM | POA: Diagnosis not present

## 2022-04-21 ENCOUNTER — Telehealth: Payer: Self-pay

## 2022-04-21 DIAGNOSIS — H353132 Nonexudative age-related macular degeneration, bilateral, intermediate dry stage: Secondary | ICD-10-CM | POA: Diagnosis not present

## 2022-04-21 DIAGNOSIS — H348322 Tributary (branch) retinal vein occlusion, left eye, stable: Secondary | ICD-10-CM | POA: Diagnosis not present

## 2022-04-21 DIAGNOSIS — H35372 Puckering of macula, left eye: Secondary | ICD-10-CM | POA: Diagnosis not present

## 2022-04-21 NOTE — Telephone Encounter (Addendum)
Pt called to report that her INR is 2.0 this morning. Message sent to [facility].

## 2022-04-21 NOTE — Telephone Encounter (Signed)
Pt notified of Kelli's recommendations listed below. She verbalized understanding.

## 2022-04-22 ENCOUNTER — Other Ambulatory Visit: Payer: Self-pay | Admitting: Family Medicine

## 2022-04-23 ENCOUNTER — Other Ambulatory Visit: Payer: Self-pay | Admitting: Family Medicine

## 2022-04-23 DIAGNOSIS — I10 Essential (primary) hypertension: Secondary | ICD-10-CM

## 2022-04-25 ENCOUNTER — Other Ambulatory Visit: Payer: Self-pay | Admitting: Hematology and Oncology

## 2022-04-25 ENCOUNTER — Telehealth: Payer: Self-pay

## 2022-04-25 NOTE — Telephone Encounter (Addendum)
Pt called to report that her INR was 3.8 last night. She currently takes Coumadin '6mg'$  po Mon, Wed and Friday. She takes '5mg'$  on Tue, Thur, Sat and Sunday.

## 2022-04-28 DIAGNOSIS — M5416 Radiculopathy, lumbar region: Secondary | ICD-10-CM | POA: Diagnosis not present

## 2022-04-28 DIAGNOSIS — M545 Low back pain, unspecified: Secondary | ICD-10-CM | POA: Diagnosis not present

## 2022-05-01 ENCOUNTER — Other Ambulatory Visit: Payer: Self-pay | Admitting: Cardiology

## 2022-05-01 NOTE — Progress Notes (Unsigned)
Subjective:  Patient ID: Pamela [name], female    DOB: 05-Jan-1946  Age: 76 y.o. MRN: [id number]  Chief Complaint  Patient presents with   Diabetes   Hypertension   Hyperlipidemia    HPI Diabetes:  Complications: Hypertension Glucose checking: Several times a day Glucose logs:112 to > 400. Sometimes machine says HI.  Hypoglycemia: no lately Most recent A1C: 7.2% Current medications: Toujeo 10 units daily. Novolog sliding scale. < 170 - 3 U 171-220 +1 U 221-270 +2 U 271-320 +3 U 321-370 + 4 U 371- 420 + 5 U 421- 470 + 6 U  Farxiga 5 mg daily. Last Eye Exam: 10/21/2021 Foot checks: several days.  Hyperlipidemia: Current medications: Atorvastatin 80 mg daily.  Hypertension: Complications: Diabetes, Hyperlipidemia. Current medications: Lisinopril 20 mg daily, Amlodipine 5 mg daily,Aspirin 81 mg daily, Isosorbide 30 mg daily.  GERD: protonix 40 mg once daily.    Current Outpatient Medications on File Prior to Visit  Medication Sig Dispense Refill   albuterol (VENTOLIN HFA) 108 (90 Base) MCG/ACT inhaler INHALE 1 TO 2 PUFFS INTO THE LUNGS EVERY 6 HOURS AS NEEDED FOR WHEEZING OR SHORTNESS OF BREATH 6.7 g 3   amLODipine (NORVASC) 5 MG tablet TAKE 1 TABLET(5 MG) BY MOUTH DAILY 90 tablet 1   atorvastatin (LIPITOR) 80 MG tablet TAKE 1 TABLET(80 MG) BY MOUTH DAILY 90 tablet 0   azelastine (ASTELIN) 0.1 % nasal spray Place 2 sprays into both nostrils daily as needed for rhinitis or allergies. 30 mL 6   B Complex-C (B-COMPLEX WITH VITAMIN C) tablet Take 1 tablet by mouth daily with lunch.     Calcium Citrate-Vitamin D (CALCIUM CITRATE + PO) Take by mouth.     Cholecalciferol (VITAMIN D3) 50 MCG (2000 UT) TABS Take 4,000 Units by mouth daily with lunch.     Continuous Blood Gluc Sensor (FREESTYLE LIBRE 2 SENSOR) MISC 2 each by Does not apply route every 14 (fourteen) days. E11.40 6 each 3   dapagliflozin propanediol (FARXIGA) 5 MG TABS tablet Take 1 tablet (5 mg  total) by mouth daily before breakfast. 90 tablet 1   denosumab (PROLIA) 60 MG/ML SOSY injection 60 mg by sub-q route.     estradiol (ESTRACE) 0.1 MG/GM vaginal cream Place 1 Applicatorful vaginally 2 (two) times a week. Uses twice weekly     ferrous sulfate 325 (65 FE) MG tablet Take 325 mg by mouth daily with lunch.     GEMTESA 75 MG TABS Take by mouth.     Insulin Aspart FlexPen (NOVOLOG) 100 UNIT/ML Max daily 30 units 15 mL 6   insulin glargine, 1 Unit Dial, (TOUJEO) 300 UNIT/ML Solostar Pen Inject 10 Units into the skin at bedtime. 15 mL 3   Insulin Pen Needle (BD PEN NEEDLE NANO 2ND GEN) 32G X 4 MM MISC USE DAILY AS DIRECTED 200 each 3   ipratropium-albuterol (DUONEB) 0.5-2.5 (3) MG/3ML SOLN Take 3 mLs by nebulization 4 (four) times daily as needed (wheezing/shortness of breath).  0   lisinopril (ZESTRIL) 20 MG tablet TAKE 2 TABLETS(40 MG) BY MOUTH TWICE DAILY 180 tablet 1   magnesium oxide (MAG-OX) 400 (240 Mg) MG tablet TAKE 2 TABLETS(800 MG) BY MOUTH TWICE DAILY (Patient taking differently: Take 800 mg by mouth 2 (two) times daily.) 120 tablet 2   methocarbamol (ROBAXIN) 750 MG tablet Take 1 tablet by mouth 3 (three) times daily.     Multiple Vitamins-Minerals (PRESERVISION AREDS 2 PO) Take by mouth.  nystatin cream (MYCOSTATIN) APPLY TO AREA OF RED IRRITATED RASH 3 TO 4 TIMES A DAY FOR SKIN YEAST INFECTION     ondansetron (ZOFRAN-ODT) 8 MG disintegrating tablet Take 8 mg by mouth every 8 (eight) hours as needed for nausea or vomiting.      oxyCODONE-acetaminophen (PERCOCET/ROXICET) 5-325 MG tablet Take 1 tablet by mouth every 6 (six) hours as needed.     OZEMPIC, 0.25 OR 0.5 MG/DOSE, 2 MG/3ML SOPN SMARTSIG:0.25 Milligram(s) Topical Once a Week     pantoprazole (PROTONIX) 40 MG tablet Take 1 tablet (40 mg total) by mouth every morning. 90 tablet 3   polyethylene glycol (MIRALAX / GLYCOLAX) 17 g packet Take 17 g by mouth as needed. Uses 1-2 times weekly     traZODone (DESYREL) 50 MG  tablet Take 1 tablet (50 mg total) by mouth at bedtime as needed for sleep. 90 tablet 0   warfarin (COUMADIN) 1 MG tablet Take 1 tablet (1 mg total) by mouth as directed. 120 tablet 2   warfarin (COUMADIN) 5 MG tablet TAKE 1 TABLET BY MOUTH EVERY DAY 90 tablet 1   aspirin EC (ASPIRIN LOW DOSE) 81 MG tablet Take 1 tablet (81 mg total) by mouth daily. 90 tablet 0   furosemide (LASIX) 40 MG tablet Take 1 tablet (40 mg total) by mouth daily. 90 tablet 3   isosorbide mononitrate (IMDUR) 30 MG 24 hr tablet Take 1 tablet (30 mg total) by mouth daily. 90 tablet 3   No current facility-administered medications on file prior to visit.   Past Medical History:  Diagnosis Date   Anticoagulated on Coumadin    chronic--- managed by hematology/ oncology,   Asthma, mild intermittent    followed by pcp--- per pt last exacerbation winter 2021 w/ acute bronchitis   Bilateral leg cramps    Chronic constipation    CKD (chronic kidney disease), stage III ([place])    Closed bimalleolar fracture of left ankle 02/26/2020   DDD (degenerative disc disease), cervical    w/ spondylosis,  per pt last steroid injection 06/ 2022   DDD (degenerative disc disease), lumbosacral    DVT (deep venous thrombosis) ([place]) 07/30/2013   Dyspnea    occasionally   GERD (gastroesophageal reflux disease)    History of cardiac murmur as a child    History of DVT of lower extremity    left lower extremity in 1980s, fell when bowling   History of pulmonary embolus (PE) 1993   per pt left lung post op 2 wks cholecystectomy   History of rheumatic fever as a child    per last echo 12-31-2019 no valvular issues   History of TIA (transient ischemic attack)    2014 and 2018 or 2019,  per pt no residual   HTN (hypertension)    followed by pcp   Hyperlipidemia    IDA (iron deficiency anemia)    Macular degeneration of both eyes    Mild obstructive sleep apnea    per pt dx 2017 tried to uses cpap but intolerant   Mixed incontinence urge  and stress    urologist--- dr Nila Nephew   OA (osteoarthritis) 07/06/2018   Osteoporosis    PAF (paroxysmal atrial fibrillation) ([place]) 10/01/2014   cardiologist--- dr Godfrey Pick tobb;   cardiac cath 02-18-2013 normal coronaries arteries, ef 50%, cath done since echo showed ef 30-35%; nucleat stress study 04/ 2020 normal , normal echo 04/ 2021,  event monitor 09-14-2020 rare ST/AT variable block   PONV (postoperative nausea  and vomiting)    Protein S deficiency ([place])    followed by hemotology/ oncology-- dr d. Bobby Rumpf ([facility]) dx 1980s;  prior DVT left lower leg 1980s and left lung PE 1993; chronic  coumadin since 1980s   PVC's (premature ventricular contractions)    followed by cardiology   S/P cardiac catheterization 02/2013   Normal coronaries; low normal EF at 50%   Solitary pulmonary nodule on lung CT 02/06/2019   First noted 01/13/2014 > no change as of 12/21/2018   Spondylolisthesis, lumbar region 08/09/2018   Stroke ([place])    TIA in 2018 or 2019   Transient ischemic attack 07/30/2013   Type 2 diabetes mellitus treated with insulin ([place])    endocrilogist--- whitney reardon NP     (03-10-2021  pt continuously checks blood sugar throughout the day w/ Libre, fasting sugar --- 69--200)   Wears glasses    Wears hearing aid in both ears    Past Surgical History:  Procedure Laterality Date   ABDOMINAL HYSTERECTOMY     BLEPHAROPLASTY     CARDIAC CATHETERIZATION  02/18/2013   @ [place]  by Dr Martinique;  normal coronaries w/ preserved LVF, ef 50%;   previous cath 2001 normal ef 65%   CARPAL TUNNEL RELEASE Bilateral 1994   CATARACT EXTRACTION W/ INTRAOCULAR LENS IMPLANT Bilateral 2017   CHOLECYSTECTOMY, LAPAROSCOPIC  1993   COLONOSCOPY     EAR BIOPSY Left    FINGER SURGERY Left 2018   thumb   FOOT SURGERY     FOOT TENDON SURGERY Right    early 2000s   HAND SURGERY     HARDWARE REMOVAL Left 03/12/2021   Procedure: HARDWARE REMOVAL;  Surgeon: Nicholes Stairs, MD;  Location:  [facility];  Service: Orthopedics;  Laterality: Left;  60 MINS   ORIF ANKLE FRACTURE Left 02/26/2020   Procedure: OPEN REDUCTION INTERNAL FIXATION (ORIF) ANKLE FRACTURE;  Surgeon: Nicholes Stairs, MD;  Location: [facility];  Service: Orthopedics;  Laterality: Left;  90 mins   TONSILLECTOMY AND ADENOIDECTOMY Bilateral    TOTAL KNEE ARTHROPLASTY Left 03/2005   TOTAL VAGINAL HYSTERECTOMY  1988   per pt still has ovaries    Family History  Problem Relation Age of Onset   Cirrhosis Mother    Antithrombin III deficiency Mother        multiple emboli   Ulcerative colitis Mother    Diabetes Father    Coronary artery disease Father    Heart attack Father    Hypertension Father    Kidney disease Father    Hypertension Sister    Diabetes Sister    Heart attack Sister        age 14    Protein S deficiency Daughter    Heart attack Brother    Hypertension Brother    Lung cancer Brother    Stroke Neg Hx    Colitis Neg Hx    Colon polyps Neg Hx    Esophageal cancer Neg Hx    Liver cancer Neg Hx    Pancreatic cancer Neg Hx    Rectal cancer Neg Hx    Stomach cancer Neg Hx    Breast cancer Neg Hx    Social History   Socioeconomic History   Marital status: Married    Spouse name: [name]   Number of children: Not on file   Years of education: Not on file   Highest education level: Not on file  Occupational History  Not on file  Tobacco Use   Smoking status: Never   Smokeless tobacco: Never  Vaping Use   Vaping Use: Never used  Substance and Sexual Activity   Alcohol use: No   Drug use: Never   Sexual activity: Not on file  Other Topics Concern   Not on file  Social History Narrative   Lives with spouse in [place].  2 grown daughters.   Retired Glass blower/designer     Social Determinants of [place] Strain: [place]  (03/23/2022)   Overall Financial Resource Strain (CARDIA)    Difficulty of Paying Living Expenses: Not hard at all  Food  Insecurity: No Food Insecurity (09/30/2020)   Hunger Vital Sign    Worried About Running Out of Food in the Last Year: Never true    [place] in the Last Year: Never true  Transportation Needs: No Transportation Needs (03/23/2022)   PRAPARE - Hydrologist (Medical): No    Lack of Transportation (Non-Medical): No  Physical Activity: Not on file  Stress: Not on file  Social Connections: Not on file    Review of Systems  Constitutional:  Negative for chills, fatigue and fever.  HENT:  Negative for congestion, ear pain and sore throat.   Respiratory:  Negative for cough and shortness of breath.   Cardiovascular:  Negative for chest pain and palpitations.  Gastrointestinal:  Negative for abdominal pain, constipation, diarrhea, nausea and vomiting.  Endocrine: Negative for polydipsia, polyphagia and polyuria.  Genitourinary:  Negative for difficulty urinating and dysuria.  Musculoskeletal:  Positive for arthralgias (Right hand). Negative for back pain and myalgias.  Skin:  Negative for rash.  Neurological:  Negative for headaches.  Psychiatric/Behavioral:  Negative for dysphoric mood. The patient is not nervous/anxious.      Objective:  BP 120/60   Pulse 63   Temp 97.9 F (36.6 C)   Ht 5' (1.524 m)   Wt 168 lb (76.2 kg)   SpO2 96%   BMI 32.81 kg/m      05/02/2022    8:44 AM 03/30/2022   12:00 PM 02/15/2022   12:50 PM  BP/Weight  Systolic BP 099 833 825  Diastolic BP 60 62 60  Wt. (Lbs) 168 174.2 175.4  BMI 32.81 kg/m2 34.02 kg/m2 34.26 kg/m2    Physical Exam Vitals reviewed.  Constitutional:      Appearance: Normal appearance. She is normal weight.  Neck:     Vascular: No carotid bruit.  Cardiovascular:     Rate and Rhythm: Normal rate and regular rhythm.     Heart sounds: Normal heart sounds.  Pulmonary:     Effort: Pulmonary effort is normal. No respiratory distress.     Breath sounds: Normal breath sounds.  Abdominal:      General: Abdomen is flat. Bowel sounds are normal.     Palpations: Abdomen is soft.     Tenderness: There is no abdominal tenderness.  Neurological:     Mental Status: She is alert and oriented to person, place, and time.  Psychiatric:        Mood and Affect: Mood normal.        Behavior: Behavior normal.     Diabetic Foot Exam - Simple   Simple Foot Form  05/02/2022 12:14 PM  Visual Inspection No deformities, no ulcerations, no other skin breakdown bilaterally: Yes Sensation Testing Intact to touch and monofilament testing bilaterally: Yes Pulse Check Posterior Tibialis  and Dorsalis pulse intact bilaterally: Yes Comments      Lab Results  Component Value Date   WBC 7.9 05/02/2022   HGB 13.4 05/02/2022   HCT 41.1 05/02/2022   PLT 453 (H) 05/02/2022   GLUCOSE 189 (H) 05/02/2022   CHOL 143 05/02/2022   TRIG 86 05/02/2022   HDL 70 05/02/2022   LDLCALC 57 05/02/2022   ALT 32 05/02/2022   AST 27 05/02/2022   NA 136 05/02/2022   K 4.5 05/02/2022   CL 94 (L) 05/02/2022   CREATININE 1.09 (H) 05/02/2022   BUN 26 05/02/2022   CO2 25 05/02/2022   TSH 1.480 05/02/2022   INR 3.80 (A) 02/08/2022   HGBA1C 7.2 (H) 01/24/2022   MICROALBUR 10 06/15/2021      Assessment & Plan:   Problem List Items Addressed This Visit       Digestive   Chronic constipation    The current medical regimen is effective;  continue present plan and medications.        Endocrine   Type 2 diabetes mellitus with diabetic neuropathy, with long-term current use of insulin (HCC) (Chronic)    Control:  Recommend check sugars fasting daily. Recommend check feet daily. Recommend annual eye exams. Medicines: Toujeo 10 units daily. Novolog sliding scale. < 170 - 3 U 171-220 +1 U 221-270 +2 U 271-320 +3 U 321-370 + 4 U 371- 420 + 5 U 421- 470 + 6 U  Farxiga 5 mg daily. Continue to work on eating a healthy diet and exercise.  Labs drawn today.        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  No changes to medicines. Lisinopril 20 mg daily, Amlodipine 5 mg daily,Aspirin 81 mg daily, Isosorbide 30 mg daily. Continue to work on eating a healthy diet and exercise.  Labs drawn today.       Relevant Orders   Comprehensive metabolic panel (Completed)   CBC with Differential/Platelet (Completed)     Hematopoietic and Hemostatic   Acquired thrombophilia ([place])    Currently on coumadin due to Afib.        Other   Mixed hyperlipidemia - Primary    Well controlled.  No changes to medicines. Atorvastatin 80 mg daily. Continue to work on eating a healthy diet and exercise.  Labs drawn today.       Relevant Orders   Lipid panel (Completed)   TSH (Completed)  .  No orders of the defined types were placed in this encounter.   Orders Placed This Encounter  Procedures   Comprehensive metabolic panel   Lipid panel   CBC with Differential/Platelet   TSH   Cardiovascular Risk Assessment   Total time spent on today's visit was greater than 30 minutes, including both face-to-face time and nonface-to-face time personally spent on review of chart (labs and imaging), discussing labs and goals, discussing further work-up, treatment options, referrals to specialist if needed, reviewing outside records of pertinent, answering patient's questions, and coordinating care.    Follow-up: Return in about 4 months (around 09/01/2022) for chronic fasting.  An After Visit Summary was printed and given to the patient.  [name], MD [facility] [phone]

## 2022-05-02 ENCOUNTER — Ambulatory Visit (INDEPENDENT_AMBULATORY_CARE_PROVIDER_SITE_OTHER): Payer: Medicare PPO | Admitting: Family Medicine

## 2022-05-02 ENCOUNTER — Telehealth: Payer: Self-pay

## 2022-05-02 ENCOUNTER — Encounter: Payer: Self-pay | Admitting: Family Medicine

## 2022-05-02 VITALS — BP 120/60 | HR 63 | Temp 97.9°F | Ht 60.0 in | Wt 168.0 lb

## 2022-05-02 DIAGNOSIS — E1122 Type 2 diabetes mellitus with diabetic chronic kidney disease: Secondary | ICD-10-CM | POA: Diagnosis not present

## 2022-05-02 DIAGNOSIS — D6869 Other thrombophilia: Secondary | ICD-10-CM

## 2022-05-02 DIAGNOSIS — I129 Hypertensive chronic kidney disease with stage 1 through stage 4 chronic kidney disease, or unspecified chronic kidney disease: Secondary | ICD-10-CM | POA: Diagnosis not present

## 2022-05-02 DIAGNOSIS — N1831 Chronic kidney disease, stage 3a: Secondary | ICD-10-CM | POA: Diagnosis not present

## 2022-05-02 DIAGNOSIS — E1140 Type 2 diabetes mellitus with diabetic neuropathy, unspecified: Secondary | ICD-10-CM | POA: Diagnosis not present

## 2022-05-02 DIAGNOSIS — K5909 Other constipation: Secondary | ICD-10-CM

## 2022-05-02 DIAGNOSIS — Z794 Long term (current) use of insulin: Secondary | ICD-10-CM | POA: Diagnosis not present

## 2022-05-02 DIAGNOSIS — E782 Mixed hyperlipidemia: Secondary | ICD-10-CM | POA: Diagnosis not present

## 2022-05-02 DIAGNOSIS — E1165 Type 2 diabetes mellitus with hyperglycemia: Secondary | ICD-10-CM | POA: Diagnosis not present

## 2022-05-02 NOTE — Telephone Encounter (Signed)
Pt notified of Kelli's recommendation & pt verbalized understanding.

## 2022-05-02 NOTE — Telephone Encounter (Signed)
Rx refill sent to pharmacy. 

## 2022-05-02 NOTE — Telephone Encounter (Signed)
I spoke with Pamela [name]. She reported her INR was 2.8 this morning. Please advise.

## 2022-05-03 DIAGNOSIS — Z4789 Encounter for other orthopedic aftercare: Secondary | ICD-10-CM | POA: Diagnosis not present

## 2022-05-03 DIAGNOSIS — M13841 Other specified arthritis, right hand: Secondary | ICD-10-CM | POA: Diagnosis not present

## 2022-05-03 LAB — CBC WITH DIFFERENTIAL/PLATELET
Basophils Absolute: 0.1 10*3/uL (ref 0.0–0.2)
Basos: 1 %
EOS (ABSOLUTE): 0.2 10*3/uL (ref 0.0–0.4)
Eos: 2 %
Hematocrit: 41.1 % (ref 34.0–46.6)
Hemoglobin: 13.4 g/dL (ref 11.1–15.9)
Immature Grans (Abs): 0 10*3/uL (ref 0.0–0.1)
Immature Granulocytes: 0 %
Lymphocytes Absolute: 1 10*3/uL (ref 0.7–3.1)
Lymphs: 12 %
MCH: 30.2 pg (ref 26.6–33.0)
MCHC: 32.6 g/dL (ref 31.5–35.7)
MCV: 93 fL (ref 79–97)
Monocytes Absolute: 0.6 10*3/uL (ref 0.1–0.9)
Monocytes: 8 %
Neutrophils Absolute: 6.1 10*3/uL (ref 1.4–7.0)
Neutrophils: 77 %
Platelets: 453 10*3/uL — ABNORMAL HIGH (ref 150–450)
RBC: 4.43 x10E6/uL (ref 3.77–5.28)
RDW: 12.6 % (ref 11.7–15.4)
WBC: 7.9 10*3/uL (ref 3.4–10.8)

## 2022-05-03 LAB — COMPREHENSIVE METABOLIC PANEL
ALT: 32 IU/L (ref 0–32)
AST: 27 IU/L (ref 0–40)
Albumin/Globulin Ratio: 1.7 (ref 1.2–2.2)
Albumin: 4.3 g/dL (ref 3.8–4.8)
Alkaline Phosphatase: 81 IU/L (ref 44–121)
BUN/Creatinine Ratio: 24 (ref 12–28)
BUN: 26 mg/dL (ref 8–27)
Bilirubin Total: 0.5 mg/dL (ref 0.0–1.2)
CO2: 25 mmol/L (ref 20–29)
Calcium: 9.7 mg/dL (ref 8.7–10.3)
Chloride: 94 mmol/L — ABNORMAL LOW (ref 96–106)
Creatinine, Ser: 1.09 mg/dL — ABNORMAL HIGH (ref 0.57–1.00)
Globulin, Total: 2.6 g/dL (ref 1.5–4.5)
Glucose: 189 mg/dL — ABNORMAL HIGH (ref 70–99)
Potassium: 4.5 mmol/L (ref 3.5–5.2)
Sodium: 136 mmol/L (ref 134–144)
Total Protein: 6.9 g/dL (ref 6.0–8.5)
eGFR: 52 mL/min/{1.73_m2} — ABNORMAL LOW (ref >59)

## 2022-05-03 LAB — LIPID PANEL
Chol/HDL Ratio: 2 ratio (ref 0.0–4.4)
Cholesterol, Total: 143 mg/dL (ref 100–199)
HDL: 70 mg/dL (ref >39)
LDL Chol Calc (NIH): 57 mg/dL (ref 0–99)
Triglycerides: 86 mg/dL (ref 0–149)
VLDL Cholesterol Cal: 16 mg/dL (ref 5–40)

## 2022-05-03 LAB — TSH: TSH: 1.48 u[IU]/mL (ref 0.450–4.500)

## 2022-05-04 DIAGNOSIS — H524 Presbyopia: Secondary | ICD-10-CM | POA: Diagnosis not present

## 2022-05-06 ENCOUNTER — Telehealth: Payer: Self-pay

## 2022-05-06 NOTE — Patient Outreach (Signed)
  Care Coordination   05/06/2022 Name: Pamela [name] MRN: [id number] DOB: [birth year]   Care Coordination Outreach Attempts:  An unsuccessful telephone outreach was attempted today to offer the patient information about available care coordination services as a benefit of their health plan.   Follow Up Plan:  Additional outreach attempts will be made to offer the patient care coordination information and services.   Encounter Outcome:  No Answer  Care Coordination Interventions Activated:  No   Care Coordination Interventions:  No, not indicated    Tomasa Rand, RN, BSN, CEN [facility] [employer] [phone]

## 2022-05-07 NOTE — Assessment & Plan Note (Signed)
Control:  Recommend check sugars fasting daily. Recommend check feet daily. Recommend annual eye exams. Medicines: Toujeo 10 units daily. Novolog sliding scale. < 170 - 3 U 171-220 +1 U 221-270 +2 U 271-320 +3 U 321-370 + 4 U 371- 420 + 5 U 421- 470 + 6 U  Farxiga 5 mg daily. Continue to work on eating a healthy diet and exercise.  Labs drawn today.

## 2022-05-07 NOTE — Assessment & Plan Note (Signed)
The current medical regimen is effective;  continue present plan and medications.  

## 2022-05-07 NOTE — Assessment & Plan Note (Signed)
Well controlled.  No changes to medicines. Atorvastatin 80 mg daily. Continue to work on eating a healthy diet and exercise.  Labs drawn today.   

## 2022-05-07 NOTE — Assessment & Plan Note (Addendum)
Currently on coumadin due to Afib.

## 2022-05-07 NOTE — Assessment & Plan Note (Signed)
Well controlled.  No changes to medicines. Lisinopril 20 mg daily, Amlodipine 5 mg daily,Aspirin 81 mg daily, Isosorbide 30 mg daily. Continue to work on eating a healthy diet and exercise.  Labs drawn today.

## 2022-05-12 DIAGNOSIS — N3946 Mixed incontinence: Secondary | ICD-10-CM | POA: Diagnosis not present

## 2022-05-12 DIAGNOSIS — N952 Postmenopausal atrophic vaginitis: Secondary | ICD-10-CM | POA: Diagnosis not present

## 2022-05-12 DIAGNOSIS — N390 Urinary tract infection, site not specified: Secondary | ICD-10-CM | POA: Diagnosis not present

## 2022-05-12 DIAGNOSIS — B3731 Acute candidiasis of vulva and vagina: Secondary | ICD-10-CM | POA: Diagnosis not present

## 2022-05-15 ENCOUNTER — Other Ambulatory Visit: Payer: Self-pay | Admitting: Family Medicine

## 2022-05-15 DIAGNOSIS — K219 Gastro-esophageal reflux disease without esophagitis: Secondary | ICD-10-CM

## 2022-05-23 ENCOUNTER — Telehealth: Payer: Self-pay

## 2022-05-23 ENCOUNTER — Telehealth: Payer: Self-pay | Admitting: Hematology and Oncology

## 2022-05-23 NOTE — Telephone Encounter (Signed)
-----   Message from Marvia Pickles, PA-C sent at 05/23/2022  2:12 PM EDT ----- Regarding: RE: INR I called the patient, thank you ----- Message ----- From: Georgette Shell, RN Sent: 05/23/2022   1:38 PM EDT To: Marvia Pickles, PA-C Subject: INR                                            INR today is 3.

## 2022-05-23 NOTE — Telephone Encounter (Signed)
Done

## 2022-05-23 NOTE — Telephone Encounter (Signed)
Spoke with patient regarding home INR of 3.  She remains on warfarin 5 mg daily, which we will continue.  I will have her repeat a home INR in 1 week.  The patient knows to contact us if she develops abnormal bruising or bleeding.  She expresses understanding.

## 2022-05-25 ENCOUNTER — Ambulatory Visit (INDEPENDENT_AMBULATORY_CARE_PROVIDER_SITE_OTHER): Payer: Medicare PPO

## 2022-05-25 DIAGNOSIS — E1140 Type 2 diabetes mellitus with diabetic neuropathy, unspecified: Secondary | ICD-10-CM

## 2022-05-25 DIAGNOSIS — E782 Mixed hyperlipidemia: Secondary | ICD-10-CM

## 2022-05-25 DIAGNOSIS — M8589 Other specified disorders of bone density and structure, multiple sites: Secondary | ICD-10-CM

## 2022-05-25 NOTE — Patient Instructions (Signed)
Visit Information   Goals Addressed   None    Patient Care Plan: ccm pharmacy care plan     Problem Identified: htn, hld, dm   Priority: High  Onset Date: 01/01/2021     Long-Range Goal: Disease State Management   Start Date: 01/01/2021  Expected End Date: 01/01/2022  Recent Progress: On track  Priority: High  Note:   Current Barriers:  Unable to achieve control of low blood sugar   Pharmacist Clinical Goal(s):  Patient will achieve control of diabetes as evidenced by a1c and blood sugar through collaboration with PharmD and provider.   Interventions: 1:1 collaboration with Cox, Elnita Maxwell, MD regarding development and update of comprehensive plan of care as evidenced by provider attestation and co-signature Inter-disciplinary care team collaboration (see longitudinal plan of care) Comprehensive medication review performed; medication list updated in electronic medical record  Hypertension (BP goal <130/80) BP Readings from Last 3 Encounters:  05/02/22 120/60  03/30/22 (!) 150/62  02/15/22 102/60  -Controlled -Current treatment: Lisinopril 50m Appropriate, Effective, Safe, Accessible Amlodipine 548mAppropriate, Effective, Safe, Accessible Furosemide 40 mg QD Appropriate, Effective, Safe, Accessible -Medications previously tried:  valsartan -Current home readings:  July 2023: Didn't have list on her -Current dietary habits:   Breakfast: Boiled egg, piece of toast, and coffee Lunch: Salad and a few crackers for  Dinner: 2 Small slices of pizza yesterday -[place] streak, 1/3 cup hashbrowns, lettuce/tomato salad, and iced tea (unsweet) -Current exercise habits: limited exercise  -Denies hypotensive/hypertensive symptoms -Educated on BP goals and benefits of medications for prevention of heart attack, stroke and kidney damage; Daily salt intake goal < 2300 mg; Importance of home blood pressure monitoring; -Counseled to monitor BP at home daily, document, and provide log  at future appointments -Counseled on diet and exercise extensively Recommended to continue therapy  Hyperlipidemia: (LDL goal < 70) The 10-year ASCVD risk score (Arnett DK, et al., 2019) is: 40.2%   Values used to calculate the score:     Age: [age]     Sex: Female     Is Non-Hispanic African American: No     Diabetic: Yes     Tobacco smoker: No     Systolic Blood Pressure: 12924mHg     Is BP treated: Yes     HDL Cholesterol: 70 mg/dL     Total Cholesterol: 143 mg/dL Lab Results  Component Value Date   CHOL 143 05/02/2022   CHOL 186 01/24/2022   CHOL 144 10/18/2021   Lab Results  Component Value Date   HDL 70 05/02/2022   HDL 59 01/24/2022   HDL 72 10/18/2021   Lab Results  Component Value Date   LDLCALC 57 05/02/2022   LDLCALC 111 (H) 01/24/2022   LDLCALC 59 10/18/2021   Lab Results  Component Value Date   TRIG 86 05/02/2022   TRIG 87 01/24/2022   TRIG 64 10/18/2021   Lab Results  Component Value Date   CHOLHDL 2.0 05/02/2022   CHOLHDL 3.2 01/24/2022   CHOLHDL 2.0 10/18/2021  No results found for: "LDLDIRECT" -Controlled -Current treatment: atorvastatin 80 mg daily Appropriate, Effective, Safe, Accessible ASA 8158mppropriate, Effective, Safe, Accessible -Medications previously tried: none reported  -Current dietary patterns: working on stabilizing blood sugar  -Current exercise habits: limited due to pain -Educated on Cholesterol goals;  Benefits of statin for ASCVD risk reduction; -Counseled on diet and exercise extensively Recommended to continue current medication  Diabetes (A1c goal <7%) Lab Results  Component Value Date  HGBA1C 7.2 (H) 01/24/2022   HGBA1C 7.4 (H) 10/18/2021   HGBA1C 7.5 (H) 06/15/2021   Lab Results  Component Value Date   MICROALBUR 10 06/15/2021   LDLCALC 57 05/02/2022   CREATININE 1.09 (H) 05/02/2022   Lab Results  Component Value Date   NA 136 05/02/2022   K 4.5 05/02/2022   CREATININE 1.09 (H) 05/02/2022    EGFR 52 (L) 05/02/2022   GFRNONAA 61 09/11/2020   GLUCOSE 189 (H) 05/02/2022   Lab Results  Component Value Date   WBC 7.9 05/02/2022   HGB 13.4 05/02/2022   HCT 41.1 05/02/2022   MCV 93 05/02/2022   PLT 453 (H) 05/02/2022  -Not ideally controlled -Current medications: freestyle libre 2  novolog units before meals Appropriate, Query effective,  SSI 90-150 4 U 151-250 +1 251-300 +2 301-350 +3 351- 400 +4 >400 +5 Toujeo 10 units daily Appropriate, Query effective,  -Medications previously tried: trulicity, metformin, actoplus met, Semaglutide (Had reaction to it in March but wouldn't say what) -Current home glucose readings Glucose: (See Libre for more) 03/21/22: 179, 242, 325, 245, 174, 245, 212, 253 03/20/22: 131, 245, 355, 143, 244, 313 05/24/22: 92-254 05/23/22: 64-274 05/22/22: 94-247 05/20/22: 65 (Low) 05/18/22: 68 (Low) -Reports hypoglycemic/hyperglycemic symptoms -Current dietary habits:   July 2023: Breakfast: Boiled egg, piece of toast, and coffee Lunch: Salad and a few crackers for  Dinner: 2 Small slices of pizza yesterday -[place] streak, 1/3 cup hashbrowns, lettuce/tomato salad, and iced tea (unsweet) -Current exercise: limited -Educated on A1c and blood sugar goals; Complications of diabetes including kidney damage, retinal damage, and cardiovascular disease; Prevention and management of hypoglycemic episodes; Continuous glucose monitoring; Carbohydrate counting and/or plate method -Counseled to check feet daily and get yearly eye exams -Counseled on diet and exercise extensively July 2023: Sugars are good in AM and then high starting around 1000. Looked up log on [facility] site. Will let Endo know. Patient states she called Endo but they only told her to adjust her diet (Which has carbs in it for every meal). Will let Endo know again Sept 2023: Let Dr. Kelton Pillar know about Hypoglycemia ASAP. Asked if patient could be evaluated before f/u in December  Insomnia  (Goal: 7-8 hours of sleep/night) -Uncontrolled -Patient currently getting  July 2023: 5 hours of sleep/night Sept 2023: bed by 2130, up at 0230 for bathroom, bed until 0600-1000 -Patient currently waking up  July 2023: 2-3 times/night Sept 2023: once a night -Patient's concern is Staying asleep -Current treatment: Trazodone 28m Appropriate, Effective, Safe, Accessible -Medications previously tried:  Melatonin -Counseled on sleep hygiene techniques (consistent sleep/wake up schedule, no electronic screens 1-2 hours before bed, etc) July 2023: Will ask PCP about sleep therapy. Used to be on Trazodone (Was stopped around when her Duloxetine was stopped. Duloxetine was stopped due to "blackouts" but she states she never had this issue with Trazodone) and though it is generally for Latency, patient stated this helped. Will ask PCP to assess Sept 2023: Sleeping significantly better per patient. Doesn't have to use med every night either  Osteoporosis / Osteopenia (Goal Prevent fractures) -Uncontrolled -has already broken/fractured many bones. States she fractured her foot while walking from carpet to tile floor -Last DEXA Scan: 2022   T-Score femoral neck:  07/16/21: -1.8  T-Score forearm radius:  07/16/21: -2.4  -Patient is a candidate for pharmacologic treatment due to history of breaks/fractures -Current treatment  Prolia (March 2023) -Medications previously tried: Calcium/Vit D  -Recommend [id number] units of vitamin D  daily. Recommend 1200 mg of calcium daily from dietary and supplemental sources. Sept 2023: Patient would like to get off of Prolia. She read it causes muscle aches and is convinced this is why she's on it. Will let PCP know -Also, patient would like prescription for a scooter to help her get around. She's afraid to walk because walking has caused her to break her feet in the past. Will ask PCP     Patient Goals/Self-Care Activities Patient will:  - take medications  as prescribed focus on medication adherence by using pill box check glucose with [name] throughout the day, document, and provide at future appointments check blood pressure daily, document, and provide at future appointments  Follow Up Plan: Telephone follow up appointment with care management team member scheduled for: March 2023  [place] Constable, Sherian Rein.D. - [phone]      Ms. Amiri was given information about Chronic Care Management services today including:  CCM service includes personalized support from designated clinical staff supervised by her physician, including individualized plan of care and coordination with other care providers 24/7 contact phone numbers for assistance for urgent and routine care needs. Standard insurance, coinsurance, copays and deductibles apply for chronic care management only during months in which we provide at least 20 minutes of these services. Most insurances cover these services at 100%, however patients may be responsible for any copay, coinsurance and/or deductible if applicable. This service may help you avoid the need for more expensive face-to-face services. Only one practitioner may furnish and bill the service in a calendar month. The patient may stop CCM services at any time (effective at the end of the month) by phone call to the office staff.  Patient agreed to services and verbal consent obtained.   The patient verbalized understanding of instructions, educational materials, and care plan provided today and DECLINED offer to receive copy of patient instructions, educational materials, and care plan.  The pharmacy team will reach out to the patient again over the next 60 days.   Lane Hacker, [place]

## 2022-05-25 NOTE — Progress Notes (Signed)
Chronic Care Management Pharmacy Note  05/25/2022 Name:  Pamela [name] MRN:  [id number] DOB:  01/26/1946  Plan Recommendations to PCP:  Hypoglycemic 3x/past week. Will let endo know Patient would like a script for a scooter. Has fractured her feet in the past just by walking and she's afraid to go out and walk now. Would like scooter to protect herself Patient read that Prolia can cause muscle aches and she's convinced this is the cause. She would like to go off all osteo meds completely but I was able to convince her to try an alternative agent (Due to her extensive fracture Hx) as opposed to completely stopping therapy. Will defer to PCP  Subjective: Pamela [name] is an 77 y.o. year old female who is a primary patient of Cox, Kirsten, MD.  The CCM team was consulted for assistance with disease management and care coordination needs.    Engaged with patient by telephone for follow up visit in response to provider referral for pharmacy case management and/or care coordination services.   Consent to Services:  The patient was given information about Chronic Care Management services, agreed to services, and gave verbal consent prior to initiation of services.  Please see initial visit note for detailed documentation.   Patient Care Team: [name], MD as PCP - General (Internal Medicine) Berniece Salines, DO as PCP - Cardiology (Cardiology) Marice Potter, MD as Consulting Physician (Oncology) Ishmael Holter, [place] (Optometry) Ortho, [place] (Specialist) Tanda Rockers, MD as Consulting Physician (Pulmonary Disease) Rozetta Nunnery, MD (Inactive) as Consulting Physician (Otolaryngology) Berniece Salines, DO as Consulting Physician (Cardiology) Renato Shin, MD (Inactive) as Consulting Physician (Endocrinology) Gavin Pound, MD as Consulting Physician (Rheumatology) Armbruster, Carlota Raspberry, MD as Consulting Physician (Gastroenterology) Lane Hacker, [facility]  (Pharmacist)   Recent office visits:  None   Recent consult visits:  03/30/22 (Endocrinology) Shamleffer, Melanie Crazier MD. Seen for DM. Started on Farxiga 5 mg daily. D/C Semaglutide 0.72m.    Hospital visits:  None  Objective:  Lab Results  Component Value Date   CREATININE 1.09 (H) 05/02/2022   BUN 26 05/02/2022   GFR 57.98 (L) 03/10/2020   GFRNONAA 61 09/11/2020   GFRAA 70 09/11/2020   NA 136 05/02/2022   K 4.5 05/02/2022   CALCIUM 9.7 05/02/2022   CO2 25 05/02/2022   GLUCOSE 189 (H) 05/02/2022    Lab Results  Component Value Date/Time   HGBA1C 7.2 (H) 01/24/2022 09:05 AM   HGBA1C 7.4 (H) 10/18/2021 08:55 AM   GFR 57.98 (L) 03/10/2020 04:46 PM   GFR 61.14 02/05/2019 12:26 PM   MICROALBUR 10 06/15/2021 04:46 PM   MICROALBUR 10 07/29/2020 12:01 PM    Last diabetic Eye exam:  Lab Results  Component Value Date/Time   HMDIABEYEEXA No Retinopathy 10/21/2021 12:00 AM    Last diabetic Foot exam: No results found for: "HMDIABFOOTEX"   Lab Results  Component Value Date   CHOL 143 05/02/2022   HDL 70 05/02/2022   LDLCALC 57 05/02/2022   TRIG 86 05/02/2022   CHOLHDL 2.0 05/02/2022       Latest Ref Rng & Units 05/02/2022   10:13 AM 02/03/2022   12:00 AM 01/24/2022    9:05 AM  Hepatic Function  Total Protein 6.0 - 8.5 g/dL 6.9   6.8   Albumin 3.8 - 4.8 g/dL 4.3  4.2     3.9   AST 0 - 40 IU/L 27  31  22   ALT 0 - 32 IU/L 32  25     19   Alk Phosphatase 44 - 121 IU/L 81  63     79   Total Bilirubin 0.0 - 1.2 mg/dL 0.5   0.3      This result is from an external source.    Lab Results  Component Value Date/Time   TSH 1.480 05/02/2022 10:13 AM   TSH 2.510 10/18/2021 08:55 AM   FREET4 1.23 01/21/2013 12:47 PM       Latest Ref Rng & Units 05/02/2022   10:13 AM 02/03/2022   12:00 AM 01/24/2022    9:05 AM  CBC  WBC 3.4 - 10.8 x10E3/uL 7.9  6.9     9.8   Hemoglobin 11.1 - 15.9 g/dL 13.4  13.0     13.2   Hematocrit 34.0 - 46.6 % 41.1  40     39.2    Platelets 150 - 450 x10E3/uL 453  311     371      This result is from an external source.    No results found for: "VD25OH"  Clinical ASCVD: Yes  The 10-year ASCVD risk score (Arnett DK, et al., 2019) is: 40.2%   Values used to calculate the score:     Age: 50 years     Sex: Female     Is Non-Hispanic African American: No     Diabetic: Yes     Tobacco smoker: No     Systolic Blood Pressure: 675 mmHg     Is BP treated: Yes     HDL Cholesterol: 70 mg/dL     Total Cholesterol: 143 mg/dL       02/15/2022    1:16 PM 01/24/2022    8:21 AM 11/16/2021   11:37 AM  Depression screen PHQ 2/9  Decreased Interest 0 0 0  Down, Depressed, Hopeless 0 0 0  PHQ - 2 Score 0 0 0      Social History   Tobacco Use  Smoking Status Never  Smokeless Tobacco Never   BP Readings from Last 3 Encounters:  05/02/22 120/60  03/30/22 (!) 150/62  02/15/22 102/60   Pulse Readings from Last 3 Encounters:  05/02/22 63  03/30/22 73  02/15/22 71   Wt Readings from Last 3 Encounters:  05/02/22 168 lb (76.2 kg)  03/30/22 174 lb 3.2 oz (79 kg)  02/15/22 175 lb 6.4 oz (79.6 kg)   BMI Readings from Last 3 Encounters:  05/02/22 32.81 kg/m  03/30/22 34.02 kg/m  02/15/22 34.26 kg/m    Assessment/Interventions: Review of patient past medical history, allergies, medications, health status, including review of consultants reports, laboratory and other test data, was performed as part of comprehensive evaluation and provision of chronic care management services.   SDOH:  (Social Determinants of Health) assessments and interventions performed: Yes SDOH Interventions    Flowsheet Row Chronic Care Management from 05/25/2022 in [place] Management from 03/23/2022 in [place] Interventions    Transportation Interventions -- Intervention Not Indicated  Financial Strain Interventions Intervention Not Indicated Intervention Not Indicated  Physical Activity  Interventions Intervention Not Indicated --      SDOH Screenings   Food Insecurity: No Food Insecurity (09/30/2020)  Housing: Low Risk  (09/30/2020)  Transportation Needs: No Transportation Needs (03/23/2022)  Depression (PHQ2-9): Low Risk  (02/15/2022)  Financial Resource Strain: Low Risk  (05/25/2022)  Physical Activity: Inactive (05/25/2022)  Tobacco Use: Low  Risk  (05/02/2022)    CCM Care Plan  Allergies  Allergen Reactions   Ketek [Telithromycin] Nausea And Vomiting and Rash   Loxapine Succinate Hives   Naproxen Sodium Anaphylaxis and Hives   Amoxapine And Related Hives   Darvon Nausea And Vomiting   Belsomra [Suvorexant] Other (See Comments)    "Nightmares"   Cymbalta [Duloxetine Hcl] Other (See Comments)    "Blackouts"   Duloxetine    Gabapentin Other (See Comments)    Blurry vision   Lyrica [Pregabalin]     Makes her sedated   Amoxicillin Rash   Propoxyphene Nausea And Vomiting    Medications Reviewed Today     Reviewed by Lane Hacker, [facility] (Pharmacist) on 05/25/22 at [phone]  Med List Status: <None>   Medication Order Taking? Sig Documenting Provider Last Dose Status Informant  albuterol (VENTOLIN HFA) 108 (90 Base) MCG/ACT inhaler [id number] No INHALE 1 TO 2 PUFFS INTO THE LUNGS EVERY 6 HOURS AS NEEDED FOR WHEEZING OR SHORTNESS OF BREATH Marge Duncans, PA-C Taking Active   amLODipine (NORVASC) 5 MG tablet [id number] No TAKE 1 TABLET(5 MG) BY MOUTH DAILY Cox, Kirsten, MD Taking Active   aspirin EC (ASPIRIN LOW DOSE) 81 MG tablet [id number]  Take 1 tablet (81 mg total) by mouth daily. Tobb, Kardie, DO  Active   atorvastatin (LIPITOR) 80 MG tablet [id number] No TAKE 1 TABLET(80 MG) BY MOUTH DAILY Cox, Kirsten, MD Taking Active   azelastine (ASTELIN) 0.1 % nasal spray [id number] No Place 2 sprays into both nostrils daily as needed for rhinitis or allergies. Marge Duncans, PA-C Taking Active   B Complex-C (B-COMPLEX WITH VITAMIN C) tablet [id number] No Take 1 tablet by mouth  daily with lunch. [provider] Taking Active Self  Calcium Citrate-Vitamin D (CALCIUM CITRATE + PO) [id number] No Take by mouth. [provider] Taking Active   Cholecalciferol (VITAMIN D3) 50 MCG (2000 UT) TABS [id number] No Take 4,000 Units by mouth daily with lunch. [provider] Taking Active Self  Continuous Blood Gluc Sensor (FREESTYLE LIBRE 2 SENSOR) MISC [id number] No 2 each by Does not apply route every 14 (fourteen) days. E11.40 Cox, Elnita Maxwell, MD Taking Active   dapagliflozin propanediol (FARXIGA) 5 MG TABS tablet [id number] No Take 1 tablet (5 mg total) by mouth daily before breakfast. Shamleffer, Melanie Crazier, MD Taking Active   denosumab (PROLIA) 60 MG/ML SOSY injection [id number] No 60 mg by sub-q route. [provider] Taking Active   estradiol (ESTRACE) 0.1 MG/GM vaginal cream [id number] No Place 1 Applicatorful vaginally 2 (two) times a week. Uses twice weekly [provider] Taking Active Self  ferrous sulfate 325 (65 FE) MG tablet [id number] No Take 325 mg by mouth daily with lunch. [provider] Taking Active Self  furosemide (LASIX) 40 MG tablet [id number] No Take 1 tablet (40 mg total) by mouth daily. Tobb, Kardie, DO Taking Expired 01/24/22 2359   GEMTESA 75 MG TABS [id number] No Take by mouth. [provider] Taking Active   Insulin Aspart FlexPen (NOVOLOG) 100 UNIT/ML [id number] No Max daily 30 units Shamleffer, Melanie Crazier, MD Taking Active   insulin glargine, 1 Unit Dial, (TOUJEO) 300 UNIT/ML Solostar Pen [id number] No Inject 10 Units into the skin at bedtime. Shamleffer, Melanie Crazier, MD Taking Active   Insulin Pen Needle (BD PEN NEEDLE NANO 2ND GEN) 32G X 4 MM MISC [id number] No USE DAILY AS DIRECTED Cox, Kirsten, MD Taking Active   ipratropium-albuterol (DUONEB) 0.5-2.5 (3) MG/3ML SOLN [id number] No Take  3 mLs by nebulization 4 (four) times daily as needed (wheezing/shortness of breath). [provider] Taking Active Self  isosorbide mononitrate (IMDUR) 30 MG 24 hr tablet [id number] No Take 1 tablet (30 mg total) by mouth daily. Tobb, Kardie, DO Taking Expired 01/24/22 2359   lisinopril (ZESTRIL) 20 MG tablet [id number] No TAKE 2 TABLETS(40 MG) BY MOUTH TWICE DAILY Cox, Kirsten, MD Taking Active   magnesium oxide (MAG-OX) 400 (240 Mg) MG tablet [id number] No TAKE 2 TABLETS(800 MG) BY MOUTH TWICE DAILY  Patient taking differently: Take 800 mg by mouth 2 (two) times daily.   Lillard Anes, MD Taking Active Self  methocarbamol (ROBAXIN) 750 MG tablet [id number] No Take 1 tablet by mouth 3 (three) times daily. [provider] Taking Active   Multiple Vitamins-Minerals (PRESERVISION AREDS 2 PO) [id number] No Take by mouth. [provider] Taking Active   nystatin cream (MYCOSTATIN) [id number] No APPLY TO AREA OF RED IRRITATED RASH 3 TO 4 TIMES A DAY FOR SKIN YEAST INFECTION [provider] Taking Active   ondansetron (ZOFRAN-ODT) 8 MG disintegrating tablet [id number] No Take 8 mg by mouth every 8 (eight) hours as needed for nausea or vomiting.  [provider] Taking Active Self  oxyCODONE-acetaminophen (PERCOCET/ROXICET) 5-325 MG tablet [id number] No Take 1 tablet by mouth every 6 (six) hours as needed. [provider] Taking Active   OZEMPIC, 0.25 OR 0.5 MG/DOSE, 2 MG/3ML SOPN [id number] No SMARTSIG:0.25 Milligram(s) Topical Once a Week [provider] Taking Active   pantoprazole (PROTONIX) 40 MG tablet [id number]  TAKE 1 TABLET(40 MG) BY MOUTH EVERY MORNING Cox, Kirsten, MD  Active   polyethylene glycol (MIRALAX / GLYCOLAX) 17 g packet [id number] No Take 17 g by mouth as needed. Uses 1-2 times weekly [provider] Taking Active Self  traZODone (DESYREL) 50 MG tablet [id number] No Take 1 tablet (50 mg total) by mouth at bedtime as needed for sleep. Cox, Kirsten, MD Taking Active   warfarin (COUMADIN) 1 MG tablet [id number] No Take 1 tablet  (1 mg total) by mouth as directed. Mosher, Thalia Bloodgood, PA-C Taking Active   warfarin (COUMADIN) 5 MG tablet [id number] No TAKE 1 TABLET BY MOUTH EVERY DAY Marice Potter, MD Taking Active             Patient Active Problem List   Diagnosis Date Noted   Acute infection of nasal sinus 01/24/2022   Joint pain 10/24/2021   Muscle pain 10/24/2021   Other fatigue 10/24/2021   Hypertensive kidney disease with stage 3a chronic kidney disease ([place]) 06/27/2021   Acquired thrombophilia ([place]) 06/27/2021   Osteopenia 06/27/2021   Mixed hyperlipidemia 06/27/2021   Class 1 obesity due to excess calories with serious comorbidity and body mass index (BMI) of 33.0 to 33.9 in adult 06/27/2021   Type 2 diabetes mellitus with diabetic neuropathy, with long-term current use of insulin ([place]) 06/15/2021   Closed fracture of fourth metatarsal bone 05/07/2021   Anemia in chronic kidney disease 02/03/2021   Type 2 diabetes mellitus with vascular disease ([place]) 12/09/2020   Chronic diastolic congestive heart failure ([place]) 12/09/2020   Type 2 diabetes mellitus with stage 3 chronic kidney disease ([place]) 12/09/2020   Asthma    Chronic constipation    DJD (degenerative joint disease)    GERD (gastroesophageal reflux disease)    Heart murmur    Sleep apnea    Solitary pulmonary nodule on lung CT 02/06/2019   Spondylolisthesis, lumbar region 08/09/2018   Osteoarthritis  of right knee 07/06/2018   Atrial fibrillation [I48.91] 10/01/2014   Protein S deficiency ([place]) 03/03/2013   DOE (dyspnea on exertion) 03/03/2013   Ventricular premature beats 05/23/2011   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) 05/23/2011    Immunization History  Administered Date(s) Administered   Fluad Quad(high Dose 65+) 05/26/2020, 05/13/2021   Influenza,inj,Quad PF,6+ Mos 06/04/2013   PFIZER Comirnaty(Gray Top)Covid-19 Tri-Sucrose Vaccine 02/04/2021   PFIZER(Purple Top)SARS-COV-2 Vaccination 09/22/2019,  10/12/2019, 06/16/2020   PNEUMOCOCCAL CONJUGATE-20 02/15/2022   Pfizer Covid-19 Vaccine Bivalent Booster 65yr & up 08/17/2021   Pneumococcal Conjugate-13 07/30/2014   Pneumococcal Polysaccharide-23 03/31/2008   Tdap 11/14/2013   Zoster, Live 03/31/2008, 06/03/2011    Conditions to be addressed/monitored:  Hypertension, Hyperlipidemia and Diabetes  Care Plan : ccm pharmacy care plan  Updates made by KLane Hacker [place] 05/25/2022 12:00 AM     Problem: htn, hld, dm   Priority: High  Onset Date: 01/01/2021     Long-Range Goal: Disease State Management   Start Date: 01/01/2021  Expected End Date: 01/01/2022  Recent Progress: On track  Priority: High  Note:   Current Barriers:  Unable to achieve control of low blood sugar   Pharmacist Clinical Goal(s):  Patient will achieve control of diabetes as evidenced by a1c and blood sugar through collaboration with PharmD and provider.   Interventions: 1:1 collaboration with Cox, KElnita Maxwell MD regarding development and update of comprehensive plan of care as evidenced by provider attestation and co-signature Inter-disciplinary care team collaboration (see longitudinal plan of care) Comprehensive medication review performed; medication list updated in electronic medical record  Hypertension (BP goal <130/80) BP Readings from Last 3 Encounters:  05/02/22 120/60  03/30/22 (!) 150/62  02/15/22 102/60  -Controlled -Current treatment: Lisinopril 259mAppropriate, Effective, Safe, Accessible Amlodipine 67m28mppropriate, Effective, Safe, Accessible Furosemide 40 mg QD Appropriate, Effective, Safe, Accessible -Medications previously tried:  valsartan -Current home readings:  July 2023: Didn't have list on her -Current dietary habits:   Breakfast: Boiled egg, piece of toast, and coffee Lunch: Salad and a few crackers for  Dinner: 2 Small slices of pizza yesterday -[place]bury streak, 1/3 cup hashbrowns, lettuce/tomato salad, and iced  tea (unsweet) -Current exercise habits: limited exercise  -Denies hypotensive/hypertensive symptoms -Educated on BP goals and benefits of medications for prevention of heart attack, stroke and kidney damage; Daily salt intake goal < 2300 mg; Importance of home blood pressure monitoring; -Counseled to monitor BP at home daily, document, and provide log at future appointments -Counseled on diet and exercise extensively Recommended to continue therapy  Hyperlipidemia: (LDL goal < 70) The 10-year ASCVD risk score (Arnett DK, et al., 2019) is: 40.2%   Values used to calculate the score:     Age: [age]     Sex: Female     Is Non-Hispanic African American: No     Diabetic: Yes     Tobacco smoker: No     Systolic Blood Pressure: 120174Hg     Is BP treated: Yes     HDL Cholesterol: 70 mg/dL     Total Cholesterol: 143 mg/dL Lab Results  Component Value Date   CHOL 143 05/02/2022   CHOL 186 01/24/2022   CHOL 144 10/18/2021   Lab Results  Component Value Date   HDL 70 05/02/2022   HDL 59 01/24/2022   HDL 72 10/18/2021   Lab Results  Component Value Date   LDLCALC 57 05/02/2022   LDLWillernie1 (H)  01/24/2022   LDLCALC 59 10/18/2021   Lab Results  Component Value Date   TRIG 86 05/02/2022   TRIG 87 01/24/2022   TRIG 64 10/18/2021   Lab Results  Component Value Date   CHOLHDL 2.0 05/02/2022   CHOLHDL 3.2 01/24/2022   CHOLHDL 2.0 10/18/2021  No results found for: "LDLDIRECT" -Controlled -Current treatment: atorvastatin 80 mg daily Appropriate, Effective, Safe, Accessible ASA 69m Appropriate, Effective, Safe, Accessible -Medications previously tried: none reported  -Current dietary patterns: working on stabilizing blood sugar  -Current exercise habits: limited due to pain -Educated on Cholesterol goals;  Benefits of statin for ASCVD risk reduction; -Counseled on diet and exercise extensively Recommended to continue current medication  Diabetes (A1c goal <7%) Lab  Results  Component Value Date   HGBA1C 7.2 (H) 01/24/2022   HGBA1C 7.4 (H) 10/18/2021   HGBA1C 7.5 (H) 06/15/2021   Lab Results  Component Value Date   MICROALBUR 10 06/15/2021   LDLCALC 57 05/02/2022   CREATININE 1.09 (H) 05/02/2022   Lab Results  Component Value Date   NA 136 05/02/2022   K 4.5 05/02/2022   CREATININE 1.09 (H) 05/02/2022   EGFR 52 (L) 05/02/2022   GFRNONAA 61 09/11/2020   GLUCOSE 189 (H) 05/02/2022   Lab Results  Component Value Date   WBC 7.9 05/02/2022   HGB 13.4 05/02/2022   HCT 41.1 05/02/2022   MCV 93 05/02/2022   PLT 453 (H) 05/02/2022  -Not ideally controlled -Current medications: freestyle libre 2  novolog units before meals Appropriate, Query effective,  SSI 90-150 4 U 151-250 +1 251-300 +2 301-350 +3 351- 400 +4 >400 +5 Toujeo 10 units daily Appropriate, Query effective,  -Medications previously tried: trulicity, metformin, actoplus met, Semaglutide (Had reaction to it in March but wouldn't say what) -Current home glucose readings Glucose: (See Libre for more) 03/21/22: 179, 242, 325, 245, 174, 245, 212, 253 03/20/22: 131, 245, 355, 143, 244, 313 05/24/22: 92-254 05/23/22: 64-274 05/22/22: 94-247 05/20/22: 65 (Low) 05/18/22: 68 (Low) -Reports hypoglycemic/hyperglycemic symptoms -Current dietary habits:   July 2023: Breakfast: Boiled egg, piece of toast, and coffee Lunch: Salad and a few crackers for  Dinner: 2 Small slices of pizza yesterday -[place] streak, 1/3 cup hashbrowns, lettuce/tomato salad, and iced tea (unsweet) -Current exercise: limited -Educated on A1c and blood sugar goals; Complications of diabetes including kidney damage, retinal damage, and cardiovascular disease; Prevention and management of hypoglycemic episodes; Continuous glucose monitoring; Carbohydrate counting and/or plate method -Counseled to check feet daily and get yearly eye exams -Counseled on diet and exercise extensively July 2023: Sugars are good in  AM and then high starting around 1000. Looked up log on [facility]. Will let Endo know. Patient states she called Endo but they only told her to adjust her diet (Which has carbs in it for every meal). Will let Endo know again Sept 2023: Let Dr. SKelton Pillarknow about Hypoglycemia ASAP. Asked if patient could be evaluated before f/u in December  Insomnia (Goal: 7-8 hours of sleep/night) -Uncontrolled -Patient currently getting  July 2023: 5 hours of sleep/night Sept 2023: bed by 2130, up at 0230 for bathroom, bed until 0600-1000 -Patient currently waking up  July 2023: 2-3 times/night Sept 2023: once a night -Patient's concern is Staying asleep -Current treatment: Trazodone 53mAppropriate, Effective, Safe, Accessible -Medications previously tried:  Melatonin -Counseled on sleep hygiene techniques (consistent sleep/wake up schedule, no electronic screens 1-2 hours before bed, etc) July 2023: Will ask PCP about sleep therapy. Used to  be on Trazodone (Was stopped around when her Duloxetine was stopped. Duloxetine was stopped due to "blackouts" but she states she never had this issue with Trazodone) and though it is generally for Latency, patient stated this helped. Will ask PCP to assess Sept 2023: Sleeping significantly better per patient. Doesn't have to use med every night either  Osteoporosis / Osteopenia (Goal Prevent fractures) -Uncontrolled -has already broken/fractured many bones. States she fractured her foot while walking from carpet to tile floor -Last DEXA Scan: 2022   T-Score femoral neck:  07/16/21: -1.8  T-Score forearm radius:  07/16/21: -2.4  -Patient is a candidate for pharmacologic treatment due to history of breaks/fractures -Current treatment  Prolia (March 2023) -Medications previously tried: Calcium/Vit D  -Recommend [id number] units of vitamin D daily. Recommend 1200 mg of calcium daily from dietary and supplemental sources. Sept 2023: Patient would like to get off  of Prolia. She read it causes muscle aches and is convinced this is why she's on it. Will let PCP know -Also, patient would like prescription for a scooter to help her get around. She's afraid to walk because walking has caused her to break her feet in the past. Will ask PCP     Patient Goals/Self-Care Activities Patient will:  - take medications as prescribed focus on medication adherence by using pill box check glucose with [name] throughout the day, document, and provide at future appointments check blood pressure daily, document, and provide at future appointments  Follow Up Plan: Telephone follow up appointment with care management team member scheduled for: March 2023  [place] Constable, Sherian Rein.D. - [phone]        Medication Assistance: None required.  Patient affirms current coverage meets needs.  Patient's preferred pharmacy is:  [id number] [address], [place] AT [place] [address] Phone: [phone] Fax: [place], [place] [address] [place] [address] Phone: [phone] Fax: [id number] - [place], [place] AT [facility] [address] [place] [address] [place] [address] Phone: [phone] Fax: [phone]   Uses pill box? Yes Pt endorses 100% compliance  We discussed: Current pharmacy is preferred with insurance plan and patient is satisfied with pharmacy services Patient decided to: Continue current medication management strategy  Care Plan and Follow Up Patient Decision:  Patient agrees to Care Plan and Follow-up.  Plan: Telephone follow up appointment with care management team member scheduled for:  March 2024

## 2022-05-26 ENCOUNTER — Telehealth: Payer: Medicare PPO

## 2022-05-27 ENCOUNTER — Ambulatory Visit (INDEPENDENT_AMBULATORY_CARE_PROVIDER_SITE_OTHER): Payer: Medicare PPO | Admitting: Internal Medicine

## 2022-05-27 ENCOUNTER — Encounter: Payer: Self-pay | Admitting: Internal Medicine

## 2022-05-27 VITALS — BP 122/80 | HR 65 | Ht 60.0 in | Wt 166.0 lb

## 2022-05-27 DIAGNOSIS — E1140 Type 2 diabetes mellitus with diabetic neuropathy, unspecified: Secondary | ICD-10-CM

## 2022-05-27 DIAGNOSIS — Z794 Long term (current) use of insulin: Secondary | ICD-10-CM

## 2022-05-27 DIAGNOSIS — N1831 Chronic kidney disease, stage 3a: Secondary | ICD-10-CM

## 2022-05-27 DIAGNOSIS — E1122 Type 2 diabetes mellitus with diabetic chronic kidney disease: Secondary | ICD-10-CM

## 2022-05-27 DIAGNOSIS — N1832 Chronic kidney disease, stage 3b: Secondary | ICD-10-CM

## 2022-05-27 LAB — POCT GLYCOSYLATED HEMOGLOBIN (HGB A1C): Hemoglobin A1C: 7.6 % — AB (ref 4.0–5.6)

## 2022-05-27 MED ORDER — DAPAGLIFLOZIN PROPANEDIOL 10 MG PO TABS: 10.0000 mg | ORAL_TABLET | Freq: Every day | ORAL | 3 refills | Status: DC

## 2022-05-27 MED ORDER — INSULIN GLARGINE (1 UNIT DIAL) 300 UNIT/ML ~~LOC~~ SOPN: 8.0000 [IU] | PEN_INJECTOR | Freq: Every day | SUBCUTANEOUS | 3 refills | Status: DC

## 2022-05-27 NOTE — Progress Notes (Signed)
Name: Pamela [name]  MRN/ DOB: [id number], [birth year]   Age/ Sex: 76 y.o., female    PCP: [name], MD   Reason for Endocrinology Evaluation: Type 2 Diabetes Mellitus     Date of Initial Endocrinology Visit: 03/30/2022    PATIENT IDENTIFIER: [name] is a 76 y.o. female with a past medical history of T2DM, GERD, A. Fib . The patient presented for initial endocrinology clinic visit on 05/27/2022 for consultative assistance with her diabetes management.    HPI: [name] was    Diagnosed with DM in 1985 Prior Medications tried/Intolerance: Metformin - tired .Ozempic - diarrhea  Rybelsus caused nausea  Hemoglobin A1c has ranged from 7.2% in 2023, peaking at 9.8% in 2022.   Of note, the pt was seen by Dr. Loanne Drilling in 2021 for hyponatremia . Adrenal insufficiency was excluded    She was started on Farxiga in July 2023  SUBJECTIVE:   During the last visit (03/30/2022): A1c 7.2%  Today (05/27/22): [name] is here for a follow up on diabetes management. She checks her blood sugars multiple times daily through CGM. The patient has not had hypoglycemic episodes since the last clinic visit.    She  is S/P right  wrist sx 04/18/2022      HOME DIABETES REGIMEN: Pamela [name] 5 mg daily Toujeo  10 units daily  Novolog 3 units 3 times daily before every meal Correction factor: NovoLog (BG -120/50)     Statin: yes ACE-I/ARB: yes    CONTINUOUS GLUCOSE MONITORING RECORD INTERPRETATION    Dates of Recording: 9/9 - 05/26/2019/2023  Sensor description: Freestyle libre  Results statistics:   CGM use % of time 85  Average and SD 165/33.8  Time in range       70%  % Time Above 180 22  % Time above 250 8  % Time Below target 0   Glycemic patterns summary: BG's has been noted to be optimal overnight with hyperglycemia noted during the day  Hyperglycemic episodes postprandial  Hypoglycemic episodes occurred variable  Overnight  periods: Optimal     DIABETIC COMPLICATIONS: Microvascular complications:  Macular degeneration , CKD III Denies:  Last eye exam: Completed 09/2021  Macrovascular complications:   Denies: CAD, PVD, CVA   PAST HISTORY: Past Medical History:  Past Medical History:  Diagnosis Date   Anticoagulated on Coumadin    chronic--- managed by hematology/ oncology,   Asthma, mild intermittent    followed by pcp--- per pt last exacerbation winter 2021 w/ acute bronchitis   Bilateral leg cramps    Chronic constipation    CKD (chronic kidney disease), stage III ([place])    Closed bimalleolar fracture of left ankle 02/26/2020   DDD (degenerative disc disease), cervical    w/ spondylosis,  per pt last steroid injection 06/ 2022   DDD (degenerative disc disease), lumbosacral    DVT (deep venous thrombosis) ([place]) 07/30/2013   Dyspnea    occasionally   GERD (gastroesophageal reflux disease)    History of cardiac murmur as a child    History of DVT of lower extremity    left lower extremity in 1980s, fell when bowling   History of pulmonary embolus (PE) 1993   per pt left lung post op 2 wks cholecystectomy   History of rheumatic fever as a child    per last echo 12-31-2019 no valvular issues   History of TIA (transient ischemic attack)    2014 and 2018 or 2019,  per pt no residual   HTN (hypertension)    followed by pcp   Hyperlipidemia    IDA (iron deficiency anemia)    Macular degeneration of both eyes    Mild obstructive sleep apnea    per pt dx 2017 tried to uses cpap but intolerant   Mixed incontinence urge and stress    urologist--- dr Nila Nephew   OA (osteoarthritis) 07/06/2018   Osteoporosis    PAF (paroxysmal atrial fibrillation) ([place]) 10/01/2014   cardiologist--- dr Godfrey Pick tobb;   cardiac cath 02-18-2013 normal coronaries arteries, ef 50%, cath done since echo showed ef 30-35%; nucleat stress study 04/ 2020 normal , normal echo 04/ 2021,  event monitor 09-14-2020 rare ST/AT variable  block   PONV (postoperative nausea and vomiting)    Protein S deficiency ([place])    followed by hemotology/ oncology-- dr d. Bobby Rumpf ([facility]) dx 1980s;  prior DVT left lower leg 1980s and left lung PE 1993; chronic  coumadin since 1980s   PVC's (premature ventricular contractions)    followed by cardiology   S/P cardiac catheterization 02/2013   Normal coronaries; low normal EF at 50%   Solitary pulmonary nodule on lung CT 02/06/2019   First noted 01/13/2014 > no change as of 12/21/2018   Spondylolisthesis, lumbar region 08/09/2018   Stroke ([place])    TIA in 2018 or 2019   Transient ischemic attack 07/30/2013   Type 2 diabetes mellitus treated with insulin ([place])    endocrilogist--- whitney reardon NP     (03-10-2021  pt continuously checks blood sugar throughout the day w/ Libre, fasting sugar --- 69--200)   Wears glasses    Wears hearing aid in both ears    Past Surgical History:  Past Surgical History:  Procedure Laterality Date   ABDOMINAL HYSTERECTOMY     BLEPHAROPLASTY     CARDIAC CATHETERIZATION  02/18/2013   @ [facility]  by Dr Martinique;  normal coronaries w/ preserved LVF, ef 50%;   previous cath 2001 normal ef 65%   CARPAL TUNNEL RELEASE Bilateral 1994   CATARACT EXTRACTION W/ INTRAOCULAR LENS IMPLANT Bilateral 2017   CHOLECYSTECTOMY, LAPAROSCOPIC  1993   COLONOSCOPY     EAR BIOPSY Left    FINGER SURGERY Left 2018   thumb   FOOT SURGERY     FOOT TENDON SURGERY Right    early 2000s   HAND SURGERY     HARDWARE REMOVAL Left 03/12/2021   Procedure: HARDWARE REMOVAL;  Surgeon: Nicholes Stairs, MD;  Location: [facility];  Service: Orthopedics;  Laterality: Left;  60 MINS   ORIF ANKLE FRACTURE Left 02/26/2020   Procedure: OPEN REDUCTION INTERNAL FIXATION (ORIF) ANKLE FRACTURE;  Surgeon: Nicholes Stairs, MD;  Location: [facility];  Service: Orthopedics;  Laterality: Left;  90 mins   TONSILLECTOMY AND ADENOIDECTOMY Bilateral    TOTAL KNEE  ARTHROPLASTY Left 03/2005   TOTAL VAGINAL HYSTERECTOMY  1988   per pt still has ovaries    Social History:  reports that she has never smoked. She has never used smokeless tobacco. She reports that she does not drink alcohol and does not use drugs. Family History:  Family History  Problem Relation Age of Onset   Cirrhosis Mother    Antithrombin III deficiency Mother        multiple emboli   Ulcerative colitis Mother    Diabetes Father    Coronary artery disease Father    Heart attack Father    Hypertension  Father    Kidney disease Father    Hypertension Sister    Diabetes Sister    Heart attack Sister        age 58    Protein S deficiency Daughter    Heart attack Brother    Hypertension Brother    Lung cancer Brother    Stroke Neg Hx    Colitis Neg Hx    Colon polyps Neg Hx    Esophageal cancer Neg Hx    Liver cancer Neg Hx    Pancreatic cancer Neg Hx    Rectal cancer Neg Hx    Stomach cancer Neg Hx    Breast cancer Neg Hx      HOME MEDICATIONS: Allergies as of 05/27/2022       Reactions   Ketek [telithromycin] Nausea And Vomiting, Rash   Loxapine Succinate Hives   Naproxen Sodium Anaphylaxis, Hives   Amoxapine And Related Hives   Darvon Nausea And Vomiting   Belsomra [suvorexant] Other (See Comments)   "Nightmares"   Cymbalta [duloxetine Hcl] Other (See Comments)   "Blackouts"   Duloxetine    Gabapentin Other (See Comments)   Blurry vision   Lyrica [pregabalin]    Makes her sedated   Amoxicillin Rash   Propoxyphene Nausea And Vomiting        Medication List        Accurate as of May 27, 2022 11:48 AM. If you have any questions, ask your nurse or doctor.          albuterol 108 (90 Base) MCG/ACT inhaler Commonly known as: VENTOLIN HFA INHALE 1 TO 2 PUFFS INTO THE LUNGS EVERY 6 HOURS AS NEEDED FOR WHEEZING OR SHORTNESS OF BREATH   amLODipine 5 MG tablet Commonly known as: NORVASC TAKE 1 TABLET(5 MG) BY MOUTH DAILY   aspirin EC 81 MG  tablet Commonly known as: Aspirin Low Dose Take 1 tablet (81 mg total) by mouth daily.   atorvastatin 80 MG tablet Commonly known as: LIPITOR TAKE 1 TABLET(80 MG) BY MOUTH DAILY   azelastine 0.1 % nasal spray Commonly known as: ASTELIN Place 2 sprays into both nostrils daily as needed for rhinitis or allergies.   B-complex with vitamin C tablet Take 1 tablet by mouth daily with lunch.   BD Pen Needle Nano 2nd Gen 32G X 4 MM Misc Generic drug: Insulin Pen Needle USE DAILY AS DIRECTED   CALCIUM CITRATE + PO Take by mouth.   dapagliflozin propanediol 5 MG Tabs tablet Commonly known as: Farxiga Take 1 tablet (5 mg total) by mouth daily before breakfast.   estradiol 0.1 MG/GM vaginal cream Commonly known as: ESTRACE Place 1 Applicatorful vaginally 2 (two) times a week. Uses twice weekly   ferrous sulfate 325 (65 FE) MG tablet Take 325 mg by mouth daily with lunch.   FreeStyle Libre 2 Sensor Misc 2 each by Does not apply route every 14 (fourteen) days. E11.40   furosemide 40 MG tablet Commonly known as: LASIX Take 1 tablet (40 mg total) by mouth daily.   Gemtesa 75 MG Tabs Generic drug: Vibegron Take by mouth.   Insulin Aspart FlexPen 100 UNIT/ML Commonly known as: NOVOLOG Max daily 30 units   insulin glargine (1 Unit Dial) 300 UNIT/ML Solostar Pen Commonly known as: TOUJEO Inject 10 Units into the skin at bedtime.   ipratropium-albuterol 0.5-2.5 (3) MG/3ML Soln Commonly known as: DUONEB Take 3 mLs by nebulization 4 (four) times daily as needed (wheezing/shortness of breath).   isosorbide  mononitrate 30 MG 24 hr tablet Commonly known as: IMDUR Take 1 tablet (30 mg total) by mouth daily.   lisinopril 20 MG tablet Commonly known as: ZESTRIL TAKE 2 TABLETS(40 MG) BY MOUTH TWICE DAILY   magnesium oxide 400 (240 Mg) MG tablet Commonly known as: MAG-OX TAKE 2 TABLETS(800 MG) BY MOUTH TWICE DAILY What changed: See the new instructions.   methocarbamol 750 MG  tablet Commonly known as: ROBAXIN Take 1 tablet by mouth 3 (three) times daily.   nystatin cream Commonly known as: MYCOSTATIN APPLY TO AREA OF RED IRRITATED RASH 3 TO 4 TIMES A DAY FOR SKIN YEAST INFECTION   ondansetron 8 MG disintegrating tablet Commonly known as: ZOFRAN-ODT Take 8 mg by mouth every 8 (eight) hours as needed for nausea or vomiting.   oxyCODONE-acetaminophen 5-325 MG tablet Commonly known as: PERCOCET/ROXICET Take 1 tablet by mouth every 6 (six) hours as needed.   Ozempic (0.25 or 0.5 MG/DOSE) 2 MG/3ML Sopn Generic drug: Semaglutide(0.25 or 0.'5MG'$ /DOS) SMARTSIG:0.25 Milligram(s) Topical Once a Week   pantoprazole 40 MG tablet Commonly known as: PROTONIX TAKE 1 TABLET(40 MG) BY MOUTH EVERY MORNING   polyethylene glycol 17 g packet Commonly known as: MIRALAX / GLYCOLAX Take 17 g by mouth as needed. Uses 1-2 times weekly   PRESERVISION AREDS 2 PO Take by mouth.   Prolia 60 MG/ML Sosy injection Generic drug: denosumab 60 mg by sub-q route.   traZODone 50 MG tablet Commonly known as: DESYREL Take 1 tablet (50 mg total) by mouth at bedtime as needed for sleep.   Vitamin D3 50 MCG (2000 UT) Tabs Take 4,000 Units by mouth daily with lunch.   warfarin 5 MG tablet Commonly known as: COUMADIN Take as directed by the anticoagulation clinic. If you are unsure how to take this medication, talk to your nurse or doctor. Original instructions: TAKE 1 TABLET BY MOUTH EVERY DAY   warfarin 1 MG tablet Commonly known as: COUMADIN Take as directed by the anticoagulation clinic. If you are unsure how to take this medication, talk to your nurse or doctor. Original instructions: Take 1 tablet (1 mg total) by mouth as directed.         ALLERGIES: Allergies  Allergen Reactions   Ketek [Telithromycin] Nausea And Vomiting and Rash   Loxapine Succinate Hives   Naproxen Sodium Anaphylaxis and Hives   Amoxapine And Related Hives   Darvon Nausea And Vomiting    Belsomra [Suvorexant] Other (See Comments)    "Nightmares"   Cymbalta [Duloxetine Hcl] Other (See Comments)    "Blackouts"   Duloxetine    Gabapentin Other (See Comments)    Blurry vision   Lyrica [Pregabalin]     Makes her sedated   Amoxicillin Rash   Propoxyphene Nausea And Vomiting     REVIEW OF SYSTEMS: A comprehensive ROS was conducted with the patient and is negative except as per HPI     OBJECTIVE:   VITAL SIGNS: BP 122/80 (BP Location: Left Arm, Patient Position: Sitting, Cuff Size: Small)   Pulse 65   Ht 5' (1.524 m)   Wt 166 lb (75.3 kg)   SpO2 92%   BMI 32.42 kg/m    PHYSICAL EXAM:  General: Pt appears well and is in NAD  Neck: General: Supple without adenopathy or carotid bruits. Thyroid: Thyroid size normal.  No goiter or nodules appreciated.  Lungs: Clear with good BS bilat with no rales, rhonchi, or wheezes  Heart: RRR   Extremities:  Lower extremities -  No pretibial edema.   Neuro: MS is good with appropriate affect, pt is alert and Ox3     DATA REVIEWED:  Lab Results  Component Value Date   HGBA1C 7.6 (A) 05/27/2022   HGBA1C 7.2 (H) 01/24/2022   HGBA1C 7.4 (H) 10/18/2021   Lab Results  Component Value Date   MICROALBUR 10 06/15/2021   LDLCALC 57 05/02/2022   CREATININE 1.09 (H) 05/02/2022   Lab Results  Component Value Date   MICRALBCREAT 16 10/18/2021    Lab Results  Component Value Date   CHOL 143 05/02/2022   HDL 70 05/02/2022   LDLCALC 57 05/02/2022   TRIG 86 05/02/2022   CHOLHDL 2.0 05/02/2022        Latest Reference Range & Units 02/03/22 00:00  Sodium 137 - 147  138 (E)  Potassium 3.5 - 5.1 mEq/L 4.6 (E)  Chloride 99 - 108  100 (E)  CO2 13 - 22  31 ! (E)  Glucose  183 (E)  BUN 4 - 21  18 (E)  Creatinine 0.5 - 1.1  1.0 (E)  Calcium 8.7 - 10.7  8.9 (E)  Alkaline Phosphatase 25 - 125  63 (E)  Albumin 3.5 - 5.0  4.2 (E)  AST 13 - 35  31 (E)  ALT 7 - 35 U/L 25 (E)  Bilirubin, Total  0.5 (E)     ASSESSMENT / PLAN /  RECOMMENDATIONS:   1) Type 2 Diabetes Mellitus, Sub-optimally controlled, With CKD III complications - Most recent A1c of 7.6 %. Goal A1c < 7.0 %.    -In reviewing her CGM, the patient has been noted with severe postprandial hyperglycemia 250- >350 mg/dL, on 1 occasion the patient did recall that she did not take prandial insulin before eating as she forgot her insulin while eating out, which resulted in a BG reading of over 350 mg/DL -We discussed the importance of taking prandial dose of insulin before the meal, I have also advised the patient to separate NovoLog doses by 3-4 hours to prevent stacking and hypoglycemia -She has been noted with variable hypoglycemic episodes on CGM, I have asked her to try to confirm dose with a fingerstick as freestyle [name] has been noted with false hypoglycemic episodes -I will reduce her Toujeo as below -She is tolerating Pamela [name], will increase as below  MEDICATIONS: Increase Farxiga 10 mg daily Decrease Toujeo 8 units daily Continue NovoLog 3 units 3 times daily before every meal Continue correction factor : NovoLog (BG -120/50)  EDUCATION / INSTRUCTIONS: BG monitoring instructions: Patient is instructed to check her blood sugars 3 times a day, before meals. Call [facility] if: BG persistently < 70  I reviewed the Rule of 15 for the treatment of hypoglycemia in detail with the patient. Literature supplied.   2) Diabetic complications:  Eye: Does not have known diabetic retinopathy.  Neuro/ Feet: Does not have known diabetic peripheral neuropathy. Renal: Patient does not have known baseline CKD. She is  on an ACEI/ARB at present.   Follow-up in 6 months     Signed electronically by: Mack Guise, MD  [facility]  [facility] [address] [place], [place] [address] Phone: [phone] FAX: [phone]   CC: [name], MD [address] [place]  [address] Phone: [phone]  Fax: [phone]    Return to Endocrinology clinic as below: Future Appointments  Date Time Provider [place]  05/27/2022 11:50 AM Leroi Haque, Melanie Crazier,  MD [facility] None  09/07/2022  9:20 AM Cox, Kirsten, MD [facility] None  11/30/2022 10:00 AM COX CCM PHARMACIST [facility] None  02/06/2023  9:30 AM [facility] None  02/06/2023 10:00 AM Marice Potter, MD [facility] None

## 2022-05-27 NOTE — Patient Instructions (Addendum)
Increase farxiga 10 mg , 1 tablet every morning  Decrease Toujeo 8 units once daily  Continue Novolog 3 units with each meal  Novolog correctional insulin: ADD extra units on insulin to your meal-time Novolog dose if your blood sugars are higher than 180. Use the scale below to help guide you:   Blood sugar before meal Number of units to inject  Less than 170 0 unit  171 -  220 1 units  221 -  270 2 units  271 -  320 3 units2  321 -  370 4 units  371 -  420 5 units  421 -  470 6 units      HOW TO TREAT LOW BLOOD SUGARS (Blood sugar LESS THAN 70 MG/DL) Please follow the RULE OF 15 for the treatment of hypoglycemia treatment (when your (blood sugars are less than 70 mg/dL)   STEP 1: Take 15 grams of carbohydrates when your blood sugar is low, which includes:  3-4 GLUCOSE TABS  OR 3-4 OZ OF JUICE OR REGULAR SODA OR ONE TUBE OF GLUCOSE GEL    STEP 2: RECHECK blood sugar in 15 MINUTES STEP 3: If your blood sugar is still low at the 15 minute recheck --> then, go back to STEP 1 and treat AGAIN with another 15 grams of carbohydrates.

## 2022-05-30 ENCOUNTER — Telehealth: Payer: Self-pay

## 2022-05-30 NOTE — Patient Outreach (Signed)
  Care Coordination   05/30/2022 Name: Pamela [name] MRN: [id number] DOB: [birth year]   Care Coordination Outreach Attempts:  A second unsuccessful outreach was attempted today to offer the patient with information about available care coordination services as a benefit of their health plan.      Someone answered the phone and reports that patient is not home.  Follow Up Plan:  Additional outreach attempts will be made to offer the patient care coordination information and services.   Encounter Outcome:  No Answer  Care Coordination Interventions Activated:  No   Care Coordination Interventions:  No, not indicated    Tomasa Rand, RN, BSN, CEN [facility] [employer] [phone]

## 2022-05-31 DIAGNOSIS — M13841 Other specified arthritis, right hand: Secondary | ICD-10-CM | POA: Diagnosis not present

## 2022-05-31 DIAGNOSIS — M25641 Stiffness of right hand, not elsewhere classified: Secondary | ICD-10-CM | POA: Diagnosis not present

## 2022-05-31 DIAGNOSIS — Z4789 Encounter for other orthopedic aftercare: Secondary | ICD-10-CM | POA: Diagnosis not present

## 2022-06-01 ENCOUNTER — Telehealth: Payer: Self-pay | Admitting: Hematology and Oncology

## 2022-06-03 ENCOUNTER — Telehealth: Payer: Self-pay

## 2022-06-03 NOTE — Telephone Encounter (Signed)
Pamela [name] called to report that she has been having night sweats and fatigue.  She had home covid test that was negative.  She reports no fever or chills.  She was instructed to come in next week for an appointment is she continues to feel poorly over the weekend.  She was encouraged to force fluids and get plenty of rest.  She will call us Monday if symptoms have not resolved.

## 2022-06-04 DIAGNOSIS — M810 Age-related osteoporosis without current pathological fracture: Secondary | ICD-10-CM

## 2022-06-04 DIAGNOSIS — Z794 Long term (current) use of insulin: Secondary | ICD-10-CM

## 2022-06-04 DIAGNOSIS — E785 Hyperlipidemia, unspecified: Secondary | ICD-10-CM

## 2022-06-04 DIAGNOSIS — I10 Essential (primary) hypertension: Secondary | ICD-10-CM

## 2022-06-04 DIAGNOSIS — E1159 Type 2 diabetes mellitus with other circulatory complications: Secondary | ICD-10-CM | POA: Diagnosis not present

## 2022-06-06 ENCOUNTER — Emergency Department (HOSPITAL_BASED_OUTPATIENT_CLINIC_OR_DEPARTMENT_OTHER): Payer: Medicare PPO

## 2022-06-06 ENCOUNTER — Emergency Department (HOSPITAL_BASED_OUTPATIENT_CLINIC_OR_DEPARTMENT_OTHER)
Admission: EM | Admit: 2022-06-06 | Discharge: 2022-06-06 | Disposition: A | Discharge status: Home / Self Care | Payer: Medicare PPO | Attending: Emergency Medicine | Admitting: Emergency Medicine

## 2022-06-06 ENCOUNTER — Ambulatory Visit: Payer: Medicare PPO | Admitting: Physician Assistant

## 2022-06-06 ENCOUNTER — Encounter (HOSPITAL_BASED_OUTPATIENT_CLINIC_OR_DEPARTMENT_OTHER): Payer: Self-pay | Admitting: Emergency Medicine

## 2022-06-06 ENCOUNTER — Other Ambulatory Visit (HOSPITAL_BASED_OUTPATIENT_CLINIC_OR_DEPARTMENT_OTHER): Payer: Self-pay

## 2022-06-06 DIAGNOSIS — N10 Acute tubulo-interstitial nephritis: Secondary | ICD-10-CM | POA: Insufficient documentation

## 2022-06-06 DIAGNOSIS — Z7982 Long term (current) use of aspirin: Secondary | ICD-10-CM | POA: Insufficient documentation

## 2022-06-06 DIAGNOSIS — R519 Headache, unspecified: Secondary | ICD-10-CM | POA: Diagnosis not present

## 2022-06-06 DIAGNOSIS — R0602 Shortness of breath: Secondary | ICD-10-CM | POA: Insufficient documentation

## 2022-06-06 DIAGNOSIS — D72829 Elevated white blood cell count, unspecified: Secondary | ICD-10-CM | POA: Diagnosis not present

## 2022-06-06 DIAGNOSIS — N3289 Other specified disorders of bladder: Secondary | ICD-10-CM | POA: Diagnosis not present

## 2022-06-06 DIAGNOSIS — Z20822 Contact with and (suspected) exposure to covid-19: Secondary | ICD-10-CM | POA: Insufficient documentation

## 2022-06-06 DIAGNOSIS — I10 Essential (primary) hypertension: Secondary | ICD-10-CM | POA: Diagnosis not present

## 2022-06-06 DIAGNOSIS — N3090 Cystitis, unspecified without hematuria: Secondary | ICD-10-CM | POA: Diagnosis not present

## 2022-06-06 DIAGNOSIS — K8689 Other specified diseases of pancreas: Secondary | ICD-10-CM | POA: Diagnosis not present

## 2022-06-06 DIAGNOSIS — R059 Cough, unspecified: Secondary | ICD-10-CM | POA: Insufficient documentation

## 2022-06-06 DIAGNOSIS — Z7901 Long term (current) use of anticoagulants: Secondary | ICD-10-CM | POA: Diagnosis not present

## 2022-06-06 DIAGNOSIS — N12 Tubulo-interstitial nephritis, not specified as acute or chronic: Secondary | ICD-10-CM | POA: Diagnosis not present

## 2022-06-06 DIAGNOSIS — I3139 Other pericardial effusion (noninflammatory): Secondary | ICD-10-CM | POA: Diagnosis not present

## 2022-06-06 DIAGNOSIS — R079 Chest pain, unspecified: Secondary | ICD-10-CM | POA: Diagnosis not present

## 2022-06-06 DIAGNOSIS — R791 Abnormal coagulation profile: Secondary | ICD-10-CM | POA: Insufficient documentation

## 2022-06-06 DIAGNOSIS — J9811 Atelectasis: Secondary | ICD-10-CM | POA: Diagnosis not present

## 2022-06-06 DIAGNOSIS — R1031 Right lower quadrant pain: Secondary | ICD-10-CM | POA: Diagnosis present

## 2022-06-06 DIAGNOSIS — R0789 Other chest pain: Secondary | ICD-10-CM | POA: Diagnosis not present

## 2022-06-06 DIAGNOSIS — I2510 Atherosclerotic heart disease of native coronary artery without angina pectoris: Secondary | ICD-10-CM | POA: Diagnosis not present

## 2022-06-06 LAB — CBC WITH DIFFERENTIAL/PLATELET
Abs Immature Granulocytes: 0.09 10*3/uL — ABNORMAL HIGH (ref 0.00–0.07)
Basophils Absolute: 0 10*3/uL (ref 0.0–0.1)
Basophils Relative: 0 %
Eosinophils Absolute: 0.2 10*3/uL (ref 0.0–0.5)
Eosinophils Relative: 1 %
HCT: 35.6 % — ABNORMAL LOW (ref 36.0–46.0)
Hemoglobin: 12.1 g/dL (ref 12.0–15.0)
Immature Granulocytes: 1 %
Lymphocytes Relative: 12 %
Lymphs Abs: 1.4 10*3/uL (ref 0.7–4.0)
MCH: 29.7 pg (ref 26.0–34.0)
MCHC: 34 g/dL (ref 30.0–36.0)
MCV: 87.5 fL (ref 80.0–100.0)
Monocytes Absolute: 1.2 10*3/uL — ABNORMAL HIGH (ref 0.1–1.0)
Monocytes Relative: 10 %
Neutro Abs: 8.7 10*3/uL — ABNORMAL HIGH (ref 1.7–7.7)
Neutrophils Relative %: 76 %
Platelets: 307 10*3/uL (ref 150–400)
RBC: 4.07 MIL/uL (ref 3.87–5.11)
RDW: 13.5 % (ref 11.5–15.5)
WBC: 11.5 10*3/uL — ABNORMAL HIGH (ref 4.0–10.5)
nRBC: 0 % (ref 0.0–0.2)

## 2022-06-06 LAB — LIPASE, BLOOD: Lipase: 43 U/L (ref 11–51)

## 2022-06-06 LAB — TROPONIN I (HIGH SENSITIVITY)
Troponin I (High Sensitivity): 14 ng/L (ref <18)
Troponin I (High Sensitivity): 18 ng/L — ABNORMAL HIGH (ref <18)

## 2022-06-06 LAB — COMPREHENSIVE METABOLIC PANEL
ALT: 41 U/L (ref 0–44)
AST: 31 U/L (ref 15–41)
Albumin: 2.7 g/dL — ABNORMAL LOW (ref 3.5–5.0)
Alkaline Phosphatase: 76 U/L (ref 38–126)
Anion gap: 8 (ref 5–15)
BUN: 25 mg/dL — ABNORMAL HIGH (ref 8–23)
CO2: 29 mmol/L (ref 22–32)
Calcium: 8.1 mg/dL — ABNORMAL LOW (ref 8.9–10.3)
Chloride: 95 mmol/L — ABNORMAL LOW (ref 98–111)
Creatinine, Ser: 1.14 mg/dL — ABNORMAL HIGH (ref 0.44–1.00)
GFR, Estimated: 50 mL/min — ABNORMAL LOW (ref >60)
Glucose, Bld: 204 mg/dL — ABNORMAL HIGH (ref 70–99)
Potassium: 3.5 mmol/L (ref 3.5–5.1)
Sodium: 132 mmol/L — ABNORMAL LOW (ref 135–145)
Total Bilirubin: 0.6 mg/dL (ref 0.3–1.2)
Total Protein: 6.3 g/dL — ABNORMAL LOW (ref 6.5–8.1)

## 2022-06-06 LAB — RESP PANEL BY RT-PCR (FLU A&B, COVID) ARPGX2
Influenza A by PCR: NEGATIVE
Influenza B by PCR: NEGATIVE
SARS Coronavirus 2 by RT PCR: NEGATIVE

## 2022-06-06 LAB — URINALYSIS, ROUTINE W REFLEX MICROSCOPIC
Bilirubin Urine: NEGATIVE
Glucose, UA: >=500 mg/dL — AB
Ketones, ur: NEGATIVE mg/dL
Nitrite: NEGATIVE
Protein, ur: NEGATIVE mg/dL
Specific Gravity, Urine: 1.01 (ref 1.005–1.030)
pH: 6.5 (ref 5.0–8.0)

## 2022-06-06 LAB — PROTIME-INR
INR: 4.5 (ref 0.8–1.2)
Prothrombin Time: 42.4 seconds — ABNORMAL HIGH (ref 11.4–15.2)

## 2022-06-06 LAB — BRAIN NATRIURETIC PEPTIDE: B Natriuretic Peptide: 76.4 pg/mL (ref 0.0–100.0)

## 2022-06-06 LAB — URINALYSIS, MICROSCOPIC (REFLEX)

## 2022-06-06 LAB — LACTIC ACID, PLASMA: Lactic Acid, Venous: 1.1 mmol/L (ref 0.5–1.9)

## 2022-06-06 LAB — OCCULT BLOOD X 1 CARD TO LAB, STOOL: Fecal Occult Bld: NEGATIVE

## 2022-06-06 MED ORDER — ONDANSETRON HCL 4 MG PO TABS
4.0000 mg | ORAL_TABLET | Freq: Three times a day (TID) | ORAL | 0 refills | Status: DC | PRN
  Filled 2022-06-06: qty 15, 5d supply, fill #0

## 2022-06-06 MED ORDER — SODIUM CHLORIDE 0.9 % IV BOLUS
500.0000 mL | Freq: Once | INTRAVENOUS | Status: AC
  Administered 2022-06-06: 500 mL via INTRAVENOUS

## 2022-06-06 MED ORDER — ONDANSETRON HCL 4 MG/2ML IJ SOLN
4.0000 mg | Freq: Once | INTRAMUSCULAR | Status: AC
  Administered 2022-06-06: 4 mg via INTRAVENOUS
  Filled 2022-06-06: qty 2

## 2022-06-06 MED ORDER — SODIUM CHLORIDE 0.9 % IV SOLN
1.0000 g | INTRAVENOUS | Status: DC
  Administered 2022-06-06: 1 g via INTRAVENOUS
  Filled 2022-06-06: qty 10

## 2022-06-06 MED ORDER — IOHEXOL 350 MG/ML SOLN
100.0000 mL | Freq: Once | INTRAVENOUS | Status: AC | PRN
  Administered 2022-06-06: 100 mL via INTRAVENOUS

## 2022-06-06 MED ORDER — CEPHALEXIN 500 MG PO CAPS
500.0000 mg | ORAL_CAPSULE | Freq: Four times a day (QID) | ORAL | 0 refills | Status: DC
  Filled 2022-06-06: qty 28, 7d supply, fill #0

## 2022-06-06 NOTE — ED Provider Notes (Signed)
[facility] EMERGENCY DEPARTMENT Provider Note   CSN: [id number] Arrival date & time: 06/06/22  1054     History  Chief Complaint  Patient presents with   Chest Pain    Pamela [name] is a 76 y.o. female.  She is here with a complaint of 1 week of various symptoms.  She has had night sweats and chills, chills so strong that they shook her off the bed and she fell.  She has had sharp stabbing pain across her shoulder blades and sometimes into her chest.  Cough and shortness of breath.  Dyspnea on exertion.  Nausea no vomiting.  She has had some right lower quadrant abdominal pain and episodes of frequent dark stools.  She has had some urinary difficulties and feeling like she has not passed enough urine although no dysuria.  No rashes or swollen joints.  She has had some intermittent headaches.  She called her primary care doctor who recommended she come to the emergency department.  No sick contacts or recent travel.  The history is provided by the patient.  Shortness of Breath Severity:  Moderate Onset quality:  Gradual Duration:  1 week Timing:  Intermittent Progression:  Unchanged Chronicity:  New Context: activity   Relieved by:  Nothing Worsened by:  Activity Ineffective treatments:  Rest Associated symptoms: abdominal pain, chest pain, cough and headaches   Associated symptoms: no hemoptysis, no rash, no sore throat, no sputum production and no vomiting        Home Medications Prior to Admission medications   Medication Sig Start Date End Date Taking? Authorizing Provider  albuterol (VENTOLIN HFA) 108 (90 Base) MCG/ACT inhaler INHALE 1 TO 2 PUFFS INTO THE LUNGS EVERY 6 HOURS AS NEEDED FOR WHEEZING OR SHORTNESS OF BREATH 02/04/22   Marge Duncans, PA-C  amLODipine (NORVASC) 5 MG tablet TAKE 1 TABLET(5 MG) BY MOUTH DAILY 02/17/22   Cox, Elnita Maxwell, MD  aspirin EC (ASPIRIN LOW DOSE) 81 MG tablet Take 1 tablet (81 mg total) by mouth daily. 05/02/22   Tobb,  Kardie, DO  atorvastatin (LIPITOR) 80 MG tablet TAKE 1 TABLET(80 MG) BY MOUTH DAILY 04/22/22   Cox, Kirsten, MD  azelastine (ASTELIN) 0.1 % nasal spray Place 2 sprays into both nostrils daily as needed for rhinitis or allergies. Patient not taking: Reported on 05/27/2022 09/10/21   Marge Duncans, PA-C  B Complex-C (B-COMPLEX WITH VITAMIN C) tablet Take 1 tablet by mouth daily with lunch.    [provider]  Calcium Citrate-Vitamin D (CALCIUM CITRATE + PO) Take by mouth.    [provider]  Cholecalciferol (VITAMIN D3) 50 MCG (2000 UT) TABS Take 4,000 Units by mouth daily with lunch.    [provider]  Continuous Blood Gluc Sensor (FREESTYLE LIBRE 2 SENSOR) MISC 2 each by Does not apply route every 14 (fourteen) days. E11.40 11/20/20   CoxElnita Maxwell, MD  dapagliflozin propanediol (FARXIGA) 10 MG TABS tablet Take 1 tablet (10 mg total) by mouth daily before breakfast. 05/27/22   Shamleffer, Melanie Crazier, MD  denosumab (PROLIA) 60 MG/ML SOSY injection 60 mg by sub-q route.    [provider]  estradiol (ESTRACE) 0.1 MG/GM vaginal cream Place 1 Applicatorful vaginally 2 (two) times a week. Uses twice weekly 06/10/20   [provider]  ferrous sulfate 325 (65 FE) MG tablet Take 325 mg by mouth daily with lunch.    [provider]  furosemide (LASIX) 40 MG tablet Take 1 tablet (40 mg total)  by mouth daily. 07/28/21 01/24/22  Tobb, Kardie, DO  GEMTESA 75 MG TABS Take by mouth. 11/09/21   [provider]  Insulin Aspart FlexPen (NOVOLOG) 100 UNIT/ML Max daily 30 units 03/30/22   Shamleffer, Melanie Crazier, MD  insulin glargine, 1 Unit Dial, (TOUJEO) 300 UNIT/ML Solostar Pen Inject 8 Units into the skin at bedtime. 05/27/22   Shamleffer, Melanie Crazier, MD  Insulin Pen Needle (BD PEN NEEDLE NANO 2ND GEN) 32G X 4 MM MISC USE DAILY AS DIRECTED 02/07/22   Cox, Kirsten, MD  ipratropium-albuterol (DUONEB) 0.5-2.5 (3) MG/3ML SOLN Take 3 mLs by nebulization 4  (four) times daily as needed (wheezing/shortness of breath). 11/07/17   [provider]  isosorbide mononitrate (IMDUR) 30 MG 24 hr tablet Take 1 tablet (30 mg total) by mouth daily. 07/28/21 05/27/22  Tobb, Kardie, DO  lisinopril (ZESTRIL) 20 MG tablet TAKE 2 TABLETS(40 MG) BY MOUTH TWICE DAILY 04/25/22   Cox, Kirsten, MD  magnesium oxide (MAG-OX) 400 (240 Mg) MG tablet TAKE 2 TABLETS(800 MG) BY MOUTH TWICE DAILY Patient taking differently: Take 800 mg by mouth 2 (two) times daily. 02/15/21   Lillard Anes, MD  methocarbamol (ROBAXIN) 750 MG tablet Take 1 tablet by mouth 3 (three) times daily. 09/15/21   [provider]  Multiple Vitamins-Minerals (PRESERVISION AREDS 2 PO) Take by mouth.    [provider]  nystatin cream (MYCOSTATIN) APPLY TO AREA OF RED IRRITATED RASH 3 TO 4 TIMES A DAY FOR SKIN YEAST INFECTION    [provider]  ondansetron (ZOFRAN-ODT) 8 MG disintegrating tablet Take 8 mg by mouth every 8 (eight) hours as needed for nausea or vomiting.     [provider]  oxyCODONE-acetaminophen (PERCOCET/ROXICET) 5-325 MG tablet Take 1 tablet by mouth every 6 (six) hours as needed. Patient not taking: Reported on 05/27/2022 04/18/22   [provider]  pantoprazole (PROTONIX) 40 MG tablet TAKE 1 TABLET(40 MG) BY MOUTH EVERY MORNING 05/15/22   Cox, Kirsten, MD  polyethylene glycol (MIRALAX / GLYCOLAX) 17 g packet Take 17 g by mouth as needed. Uses 1-2 times weekly    [provider]  traZODone (DESYREL) 50 MG tablet Take 1 tablet (50 mg total) by mouth at bedtime as needed for sleep. 03/30/22   CoxElnita Maxwell, MD  warfarin (COUMADIN) 1 MG tablet Take 1 tablet (1 mg total) by mouth as directed. Patient not taking: Reported on 05/27/2022 02/11/22   Rosanne Sack A, PA-C  warfarin (COUMADIN) 5 MG tablet TAKE 1 TABLET BY MOUTH EVERY DAY 02/03/22   Marice Potter, MD      Allergies    Ketek [telithromycin], Loxapine succinate, Naproxen  sodium, Amoxapine and related, Darvon, Belsomra [suvorexant], Cymbalta [duloxetine hcl], Duloxetine, Gabapentin, Lyrica [pregabalin], Amoxicillin, and Propoxyphene    Review of Systems   Review of Systems  Constitutional:  Positive for chills and fatigue.  HENT:  Negative for sore throat.   Eyes:  Negative for visual disturbance.  Respiratory:  Positive for cough, chest tightness and shortness of breath. Negative for hemoptysis and sputum production.   Cardiovascular:  Positive for chest pain.  Gastrointestinal:  Positive for abdominal pain, diarrhea and nausea. Negative for vomiting.  Musculoskeletal:  Negative for gait problem.  Skin:  Negative for rash.  Neurological:  Positive for headaches.    Physical Exam Updated Vital Signs BP 117/83   Pulse 93   Temp 97.6 F (36.4 C) (Oral)   Resp 17   Ht 5' (1.524 m)  Wt 75.3 kg   SpO2 98%   BMI 32.42 kg/m  Physical Exam Vitals and nursing note reviewed.  Constitutional:      General: She is not in acute distress.    Appearance: Normal appearance. She is well-developed.  HENT:     Head: Normocephalic and atraumatic.     Right Ear: Tympanic membrane normal.     Left Ear: Tympanic membrane normal.  Eyes:     Conjunctiva/sclera: Conjunctivae normal.  Cardiovascular:     Rate and Rhythm: Normal rate and regular rhythm.     Heart sounds: Normal heart sounds. No murmur heard. Pulmonary:     Effort: Pulmonary effort is normal. No respiratory distress.     Breath sounds: Normal breath sounds.  Abdominal:     Palpations: Abdomen is soft.     Tenderness: There is no abdominal tenderness. There is no guarding or rebound.  Musculoskeletal:        General: No swelling. Normal range of motion.     Cervical back: Neck supple.     Right lower leg: No tenderness. No edema.     Left lower leg: No tenderness. No edema.  Skin:    General: Skin is warm and dry.     Capillary Refill: Capillary refill takes less than 2 seconds.   Neurological:     General: No focal deficit present.     Mental Status: She is alert.     Sensory: No sensory deficit.     Motor: No weakness.     ED Results / Procedures / Treatments   Labs (all labs ordered are listed, but only abnormal results are displayed) Labs Reviewed  CBC WITH DIFFERENTIAL/PLATELET - Abnormal; Notable for the following components:      Result Value   WBC 11.5 (*)    HCT 35.6 (*)    Neutro Abs 8.7 (*)    Monocytes Absolute 1.2 (*)    Abs Immature Granulocytes 0.09 (*)    All other components within normal limits  URINALYSIS, ROUTINE W REFLEX MICROSCOPIC - Abnormal; Notable for the following components:   Color, Urine STRAW (*)    APPearance HAZY (*)    Glucose, UA >=500 (*)    Hgb urine dipstick TRACE (*)    Leukocytes,Ua MODERATE (*)    All other components within normal limits  COMPREHENSIVE METABOLIC PANEL - Abnormal; Notable for the following components:   Sodium 132 (*)    Chloride 95 (*)    Glucose, Bld 204 (*)    BUN 25 (*)    Creatinine, Ser 1.14 (*)    Calcium 8.1 (*)    Total Protein 6.3 (*)    Albumin 2.7 (*)    GFR, Estimated 50 (*)    All other components within normal limits  PROTIME-INR - Abnormal; Notable for the following components:   Prothrombin Time 42.4 (*)    INR 4.5 (*)    All other components within normal limits  URINALYSIS, MICROSCOPIC (REFLEX) - Abnormal; Notable for the following components:   Bacteria, UA FEW (*)    Non Squamous Epithelial PRESENT (*)    All other components within normal limits  TROPONIN I (HIGH SENSITIVITY) - Abnormal; Notable for the following components:   Troponin I (High Sensitivity) 18 (*)    All other components within normal limits  RESP PANEL BY RT-PCR (FLU A&B, COVID) ARPGX2  CULTURE, BLOOD (ROUTINE X 2)  CULTURE, BLOOD (ROUTINE X 2)  BRAIN NATRIURETIC PEPTIDE  LACTIC ACID, PLASMA  OCCULT BLOOD X 1 CARD TO LAB, STOOL  LIPASE, BLOOD  TROPONIN I (HIGH SENSITIVITY)    EKG EKG  Interpretation  Date/Time:  Monday June 06 2022 11:05:44 EDT Ventricular Rate:  92 PR Interval:  148 QRS Duration: 100 QT Interval:  366 QTC Calculation: 452 R Axis:   -5 Text Interpretation: Sinus rhythm with frequent Premature ventricular complexes Minimal voltage criteria for LVH, may be normal variant ( Cornell product ) Nonspecific ST abnormality Abnormal ECG When compared with ECG of 27-Feb-2020 15:26, increased ectopy now Confirmed by Aletta Edouard [phone]) on 06/06/2022 11:07:04 AM  Radiology CT Angio Chest/Abd/Pel for Dissection W and/or W/WO  Result Date: 06/06/2022 CLINICAL DATA:  76 year old female with suspected acute aortic syndrome presents for further evaluation. EXAM: CT ANGIOGRAPHY CHEST, ABDOMEN AND PELVIS TECHNIQUE: Non-contrast CT of the chest was initially obtained. Multidetector CT imaging through the chest, abdomen and pelvis was performed using the standard protocol during bolus administration of intravenous contrast. Multiplanar reconstructed images and MIPs were obtained and reviewed to evaluate the vascular anatomy. RADIATION DOSE REDUCTION: This exam was performed according to the departmental dose-optimization program which includes automated exposure control, adjustment of the mA and/or kV according to patient size and/or use of iterative reconstruction technique. CONTRAST:  156m OMNIPAQUE IOHEXOL 350 MG/ML SOLN COMPARISON:  January of 2022 cardiac CT and CT angiography from December of 2021 FINDINGS: CTA CHEST FINDINGS Cardiovascular: Precontrast imaging with calcified aortic atherosclerosis of the thoracic aorta. Heart size is normal with small pericardial effusion that is unchanged. No sign of intramural hematoma of the thoracic aorta. The aorta displays a normal three-vessel branching pattern. There is scattered atherosclerotic plaque. Central pulmonary vessels are unremarkable. No pericardial effusion or nodularity. Mediastinum/Nodes: No thoracic inlet, axillary,  mediastinal or hilar adenopathy. Esophagus grossly normal. Lungs/Pleura: No pneumothorax. No consolidation. No pleural effusion. Airways are patent. Mild basilar atelectasis. Musculoskeletal: Please see below for full musculoskeletal details. Review of the MIP images confirms the above findings. CTA ABDOMEN AND PELVIS FINDINGS VASCULAR Aorta: Normal caliber aorta without aneurysm, dissection, vasculitis or significant stenosis. Signs of aortic atherosclerosis. Patent runoff into the iliac and femoral vessels in the upper thigh. Celiac: Patent without evidence of aneurysm, dissection, vasculitis or significant stenosis. SMA: Patent without evidence of aneurysm, dissection, vasculitis or significant stenosis. 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 Renals: Motion limited assessment of renal arteries which are grossly patent bilaterally with some mild atherosclerotic changes at the origin of these vessels. Single renal arteries. IMA: Patent without evidence of aneurysm, dissection, vasculitis or significant stenosis. Inflow: Patent without evidence of aneurysm, dissection, vasculitis or significant stenosis. Veins: No obvious venous abnormality within the limitations of this arterial phase study. Review of the MIP images confirms the above findings. NON-VASCULAR Hepatobiliary: No visible lesion in the liver on early arterial phase. Post cholecystectomy without gross biliary duct distension. Pancreas: Mild atrophy of pancreatic parenchyma predominantly in the pancreatic head. No sign of ductal dilation, lesion or inflammation. Spleen: Normal. Adrenals/Urinary Tract: Adrenal glands are normal. Patchy nephrogram present on the RIGHT with loss of corticomedullary distinction in the interpolar RIGHT kidney, upper pole and in the anterior hilar lip. These areas are associated with mild expansion of renal contour no hydronephrosis. The no ureteral calculi. Thickening of the urinary bladder with mild perivesical  stranding is diffuse. Renal cortical scarring evident bilaterally. Aside from scarring LEFT kidney is normal. Stomach/Bowel: No acute gastrointestinal findings. Appendix not visualized but there are no secondary signs of would indicate acute appendicitis.  Lymphatic: No adenopathy in the retroperitoneum or in the pelvis. Reproductive: Post hysterectomy. Other: No abdominal wall hernia or abnormality. No abdominopelvic ascites. Musculoskeletal: Spinal degenerative changes without acute or destructive bone process. Review of the MIP images confirms the above findings. IMPRESSION: 1. No evidence of acute aortic process. 2. Findings of urinary tract infection with cystitis and RIGHT pyelonephritis with areas of more focal nephritis scattered about the RIGHT kidney. Correlate with urinalysis. 3. Given indistinct appearance of areas of the RIGHT kidney would suggest follow-up with renal sonogram to ensure resolution of lobular and mildly infiltrative appearing areas which in the setting of only a single phase exam are almost certainly related to pyelonephritis as outlined above but not as well characterized as could be with a more standard venous phase assessment. Follow-up at 6-8 weeks is suggested. 4. No signs of central or lobar level pulmonary embolism. 5. Replaced RIGHT hepatic artery. 6. Aortic atherosclerosis. 7. Small pericardial effusion that is unchanged. Electronically Signed   By: Zetta Bills M.D.   On: 06/06/2022 14:59   DG Chest Port 1 View  Result Date: 06/06/2022 CLINICAL DATA:  Cough.  Chest pressure. EXAM: PORTABLE CHEST 1 VIEW COMPARISON:  June 07, 2021 FINDINGS: The heart size and mediastinal contours are within normal limits. Both lungs are clear. The visualized skeletal structures are unremarkable. IMPRESSION: No active disease. Electronically Signed   By: Dorise Bullion III M.D.   On: 06/06/2022 11:58    Procedures Procedures    Medications Ordered in ED Medications  cefTRIAXone  (ROCEPHIN) 1 g in sodium chloride 0.9 % 100 mL IVPB (0 g Intravenous Stopped 06/06/22 1635)  sodium chloride 0.9 % bolus 500 mL (0 mLs Intravenous Stopped 06/06/22 1348)  ondansetron (ZOFRAN) injection 4 mg (4 mg Intravenous Given 06/06/22 1205)  iohexol (OMNIPAQUE) 350 MG/ML injection 100 mL (100 mLs Intravenous Contrast Given 06/06/22 1416)    ED Course/ Medical Decision Making/ A&P Clinical Course as of 06/06/22 1516  Mon Jun 06, 2022  1202 Chest x-ray interpreted by me as no acute infiltrates.  Waiting radiology reading. [MB]  1309 Rectal exam done with Robyn as chaperone.  No masses empty vault sample sent to lab for guaiac. [MB]  7124 IMPRESSION: 1. No evidence of acute aortic process. 2. Findings of urinary tract infection with cystitis and RIGHT pyelonephritis with areas of more focal nephritis scattered about the RIGHT kidney. Correlate with urinalysis. 3. Given indistinct appearance of areas of the RIGHT kidney would suggest follow-up with renal sonogram to ensure resolution of lobular and mildly infiltrative appearing areas which in the setting of only a single phase exam are almost certainly related to pyelonephritis as outlined above but not as well characterized as could be with a more standard venous phase assessment. Follow-up at 6-8 weeks is suggested. 4. No signs of central or lobar level pulmonary embolism. 5. Replaced RIGHT hepatic artery. 6. Aortic atherosclerosis. 7. Small pericardial effusion that is unchanged.   [MB]    Clinical Course User Index [MB] Hayden Rasmussen, MD                           Medical Decision Making Amount and/or Complexity of Data Reviewed Labs: ordered. Radiology: ordered.  Risk Prescription drug management.  This patient complains of multiple including headache chest pain shortness of breath abdominal pain diarrhea urinary symptoms; this involves an extensive number of treatment Options and is a complaint that carries with it a  high risk of complications and morbidity. The differential includes COVID, flu, pneumonia, ACS, dissection, PE, UTI, diverticulitis  I ordered, reviewed and interpreted labs, which included CBC with mildly elevated white count normal hemoglobin, chemistries all mildly off but nothing too significant, COVID and flu negative, INR supratherapeutic, fecal occult negative, urinalysis pending I ordered medication IV fluids and nausea medication, IV antibiotics and reviewed PMP when indicated. I ordered imaging studies which included chest x-ray and CT angio chest abdomen and pelvis and I independently    visualized and interpreted imaging which showed probable pyelonephritis Previous records obtained and reviewed in epic no recent ED visits Cardiac monitoring reviewed, normal sinus rhythm Social determinants considered, patient is physically inactive Critical Interventions: None  After the interventions stated above, I reevaluated the patient and found patient to be well-appearing Admission and further testing considered, patient's care signed out to Dr. Gustavus Messing to follow-up results of CAT scan and see how she is feeling after fluids and antibiotics.  Possible discharge if symptoms improve with close PCP follow-up.          Final Clinical Impression(s) / ED Diagnoses Final diagnoses:  Acute pyelonephritis  Supratherapeutic INR    Rx / DC Orders ED Discharge Orders     None         Hayden Rasmussen, MD 06/06/22 1719

## 2022-06-06 NOTE — ED Triage Notes (Signed)
Pt arrives pov, slow gait with walker, c/o Chest pressure and shob with nausea x 1 week, back pain today, also c/o chills with cough. Also reports night sweats  and right ear pain.

## 2022-06-06 NOTE — ED Notes (Signed)
Pt tolerated PO fluids well  

## 2022-06-06 NOTE — Discharge Instructions (Addendum)
You were seen in the emergency department for various complaints including upper back and chest pain shortness of breath abdominal pain diarrhea urinary symptoms chills.  Your work-up was significant for bladder or kidney infection.  You were given IV antibiotics.  Your INR was also elevated and you will need to hold your Coumadin for a few days.  It will be important that you follow-up with your primary care doctor for repeat evaluation.  Keep well-hydrated.  Return to the emergency department if any worsening or concerning symptoms

## 2022-06-06 NOTE — ED Notes (Signed)
Pt up to BR with walker

## 2022-06-06 NOTE — ED Provider Notes (Signed)
3:45 PM Care assumed from Dr. Melina Copa.  At time of transfer care, patient is waiting for results of urinalysis and p.o. challenge.  She is receiving IV antibiotics after she was found to have evidence of pyelonephritis on CT imaging likely explaining her symptoms.  Plan of care will be to discharge home with antibiotics and nausea medicine that the previous team is already written for if she passes her p.o. challenge after the urinalysis.  Anticipate reassessment.   4:51 PM Urinalysis returned and does show evidence of UTI.  This confirms the suspected UTI/pyelonephritis.  Patient was reassessed and she passed her p.o. challenge and is feeling well.  She agrees with going home after getting IV antibiotics which she tolerated without reaction.  Patient will get the prescriptions and will treat as an outpatient initially.  She understood extremely strict return precautions and that she is to call her PCP for close follow-up.  She had no other questions or concerns and was discharged in good condition.   Clinical Impression: 1. Acute pyelonephritis   2. Supratherapeutic INR     Disposition: Discharge  Condition: Good  I have discussed the results, Dx and Tx plan with the pt(& family if present). He/she/they expressed understanding and agree(s) with the plan. Discharge instructions discussed at great length. Strict return precautions discussed and pt &/or family have verbalized understanding of the instructions. No further questions at time of discharge.    New Prescriptions   CEPHALEXIN (KEFLEX) 500 MG CAPSULE    Take 1 capsule (500 mg total) by mouth 4 (four) times daily.   ONDANSETRON (ZOFRAN) 4 MG TABLET    Take 1 tablet (4 mg total) by mouth every 8 (eight) hours as needed for nausea or vomiting.    Follow Up: [name], MD [address] [place] [address] [phone]  Schedule an appointment as soon as possible for a visit  For recheck of your symptoms      Aqil Goetting, Gwenyth Allegra, MD 06/06/22 1651

## 2022-06-07 ENCOUNTER — Telehealth: Payer: Self-pay

## 2022-06-07 NOTE — Progress Notes (Signed)
Chronic Care Management Pharmacy Assistant   Name: Pamela [name]  MRN: [id number] DOB: 26-Apr-1946   Reason for Encounter: Disease State call for DM    Recent office visits:  None  Recent consult visits:  05/27/22 (Endocrinology) Shamleffer, Melanie Crazier MD. Seen for DM. Increase farxiga 10 mg , 1 tablet every morning. Decrease Toujeo 8 units once daily   Hospital visits:  Medication Reconciliation was completed by comparing discharge summary, patient's EMR and Pharmacy list, and upon discussion with patient.  Admitted to the hospital on 06/06/22 due to Pyelonephritis. Discharge date was 06/06/22. Discharged from [place]?Medications Started at [facility] Discharge:?? -started Cephalexin '500mg'$  and Ondansetron '4mg'$    -All other medications will remain the same.    Medications: Outpatient Encounter Medications as of 06/07/2022  Medication Sig   albuterol (VENTOLIN HFA) 108 (90 Base) MCG/ACT inhaler INHALE 1 TO 2 PUFFS INTO THE LUNGS EVERY 6 HOURS AS NEEDED FOR WHEEZING OR SHORTNESS OF BREATH   amLODipine (NORVASC) 5 MG tablet TAKE 1 TABLET(5 MG) BY MOUTH DAILY   aspirin EC (ASPIRIN LOW DOSE) 81 MG tablet Take 1 tablet (81 mg total) by mouth daily.   atorvastatin (LIPITOR) 80 MG tablet TAKE 1 TABLET(80 MG) BY MOUTH DAILY   azelastine (ASTELIN) 0.1 % nasal spray Place 2 sprays into both nostrils daily as needed for rhinitis or allergies. (Patient not taking: Reported on 05/27/2022)   B Complex-C (B-COMPLEX WITH VITAMIN C) tablet Take 1 tablet by mouth daily with lunch.   Calcium Citrate-Vitamin D (CALCIUM CITRATE + PO) Take by mouth.   cephALEXin (KEFLEX) 500 MG capsule Take 1 capsule (500 mg total) by mouth 4 (four) times daily.   Cholecalciferol (VITAMIN D3) 50 MCG (2000 UT) TABS Take 4,000 Units by mouth daily with lunch.   Continuous Blood Gluc Sensor (FREESTYLE LIBRE 2 SENSOR) MISC 2 each by Does not apply route every 14 (fourteen) days.  E11.40   dapagliflozin propanediol (FARXIGA) 10 MG TABS tablet Take 1 tablet (10 mg total) by mouth daily before breakfast.   denosumab (PROLIA) 60 MG/ML SOSY injection 60 mg by sub-q route.   estradiol (ESTRACE) 0.1 MG/GM vaginal cream Place 1 Applicatorful vaginally 2 (two) times a week. Uses twice weekly   ferrous sulfate 325 (65 FE) MG tablet Take 325 mg by mouth daily with lunch.   furosemide (LASIX) 40 MG tablet Take 1 tablet (40 mg total) by mouth daily.   GEMTESA 75 MG TABS Take by mouth.   Insulin Aspart FlexPen (NOVOLOG) 100 UNIT/ML Max daily 30 units   insulin glargine, 1 Unit Dial, (TOUJEO) 300 UNIT/ML Solostar Pen Inject 8 Units into the skin at bedtime.   Insulin Pen Needle (BD PEN NEEDLE NANO 2ND GEN) 32G X 4 MM MISC USE DAILY AS DIRECTED   ipratropium-albuterol (DUONEB) 0.5-2.5 (3) MG/3ML SOLN Take 3 mLs by nebulization 4 (four) times daily as needed (wheezing/shortness of breath).   isosorbide mononitrate (IMDUR) 30 MG 24 hr tablet Take 1 tablet (30 mg total) by mouth daily.   lisinopril (ZESTRIL) 20 MG tablet TAKE 2 TABLETS(40 MG) BY MOUTH TWICE DAILY   magnesium oxide (MAG-OX) 400 (240 Mg) MG tablet TAKE 2 TABLETS(800 MG) BY MOUTH TWICE DAILY (Patient taking differently: Take 800 mg by mouth 2 (two) times daily.)   methocarbamol (ROBAXIN) 750 MG tablet Take 1 tablet by mouth 3 (three) times daily.   Multiple Vitamins-Minerals (PRESERVISION AREDS 2 PO) Take by mouth.  nystatin cream (MYCOSTATIN) APPLY TO AREA OF RED IRRITATED RASH 3 TO 4 TIMES A DAY FOR SKIN YEAST INFECTION   ondansetron (ZOFRAN) 4 MG tablet Take 1 tablet (4 mg total) by mouth every 8 (eight) hours as needed for nausea or vomiting.   ondansetron (ZOFRAN-ODT) 8 MG disintegrating tablet Take 8 mg by mouth every 8 (eight) hours as needed for nausea or vomiting.    oxyCODONE-acetaminophen (PERCOCET/ROXICET) 5-325 MG tablet Take 1 tablet by mouth every 6 (six) hours as needed. (Patient not taking: Reported on  05/27/2022)   pantoprazole (PROTONIX) 40 MG tablet TAKE 1 TABLET(40 MG) BY MOUTH EVERY MORNING   polyethylene glycol (MIRALAX / GLYCOLAX) 17 g packet Take 17 g by mouth as needed. Uses 1-2 times weekly   traZODone (DESYREL) 50 MG tablet Take 1 tablet (50 mg total) by mouth at bedtime as needed for sleep.   warfarin (COUMADIN) 1 MG tablet Take 1 tablet (1 mg total) by mouth as directed. (Patient not taking: Reported on 05/27/2022)   warfarin (COUMADIN) 5 MG tablet TAKE 1 TABLET BY MOUTH EVERY DAY   No facility-administered encounter medications on file as of 06/07/2022.    Recent Relevant Labs: Lab Results  Component Value Date/Time   HGBA1C 7.6 (A) 05/27/2022 11:45 AM   HGBA1C 7.2 (H) 01/24/2022 09:05 AM   HGBA1C 7.4 (H) 10/18/2021 08:55 AM   MICROALBUR 10 06/15/2021 04:46 PM   MICROALBUR 10 07/29/2020 12:01 PM    Kidney Function Lab Results  Component Value Date/Time   CREATININE 1.14 (H) 06/06/2022 01:02 PM   CREATININE 1.09 (H) 05/02/2022 10:13 AM   GFR 57.98 (L) 03/10/2020 04:46 PM   GFRNONAA 50 (L) 06/06/2022 01:02 PM   GFRAA 70 09/11/2020 11:23 AM     Current antihyperglycemic regimen:  novolog units before meals- Max daily 30 units Toujeo 10 units daily-Inject 8 units at bedtime  Farxiga '10mg'$  daily  Patient verbally confirms she is taking the above medications as directed. Yes  What recent interventions/DTPs have been made to improve glycemic control:  No changes   Have there been any recent hospitalizations or ED visits since last visit with CPP? Yes  Patient reports hypoglycemic symptoms, including Sweaty and Shaky  Patient reports hyperglycemic symptoms, including excessive thirst, fatigue, and weakness  How often are you checking your blood sugar? in the morning before eating or drinking, before lunch, before dinner, and at bedtime  What are your blood sugars ranging?  Fasting: 164, 249, 187, 245, 199 Before Meal: 285, 274 After meal: 246, 245, 240 Pt has  been sick with a bladder infection and has not felt good at all during this time and has not eaten much to make her sugars this high.   On insulin? Yes How many units:3 units sliding scale and 8 units at bt   During the week, how often does your blood glucose drop below 70? Never  Are you checking your feet daily/regularly? Yes  Adherence Review: Is the patient currently on a STATIN medication? Yes Is the patient currently on ACE/ARB medication? Yes Does the patient have >5 day gap between last estimated fill dates? CPP to review  Care Gaps: Last eye exam / Retinopathy Screening? 10/21/21 Last Annual Wellness Visit? 02/15/22 Last Diabetic Foot Exam? 01/24/22   Star Rating Drugs:  Medication:  Last Fill: Day Supply Farxiga   05/27/22-03/30/22 [place], [place] Pharmacist Assistant  [phone]

## 2022-06-07 NOTE — Telephone Encounter (Addendum)
Pt notified of Pamela Sack, PA's recommendation below. No warfarin/coumadin today nor Wednesday. Pt will recheck her INR on Thursday and call me with results. Pt verbalized understanding.  Received: Today   Mosher, Beryle Flock, RN Phone Number: [phone]   If she is not bleeding, just hold warfarin today and tomorrow. Can repeat Thurs. Likely will go to 2.5 mg daily in view of this. Thanks        Previous Messages    ----- Message -----  From: Dairl Ponder, RN  Sent: 06/07/2022   1:21 PM EDT  To: Marvia Pickles, PA-C  Subject: RE: INR 4.0                                     Pt called back after checking her level: INR  5.7 !!!!!      RE: INR 4.0 Received: Today Mosher, Beryle Flock, RN Phone Number: [phone]   OK, maybe meds. Please have her check INR today, then we can decide what to do. Thanks    '@1126'$  -I spoke with pt. She was seen in ER for bladder infection. They sent her home w/Keflex and to f/u with PCP this week.  She mentions that her blood sugars have been running high, like 250-300. Dr Kelton Pillar changed her Toujeo (decreased to '8mg'$  @ [facility])and Farxiga (increased to '10mg'$  q am)on 05/27/2022.   ----- Message from Marvia Pickles, PA-C sent at 06/07/2022 10:15 AM EDT ----- Regarding: RE: INR 4.0 Contact: [phone] Something is wrong because we lowered the dose last week. Any new Rx or OTC meds?  ----- Message ----- From: Dairl Ponder, RN Sent: 06/07/2022   9:00 AM EDT To: Thalia Bloodgood Mosher, PA-C Subject: INR 4.0                                        Pt LVM on nurse line that she had to go to ER @ [facility] yesterday (she didn't tell me why she went to ER). They tested her INR, results was 4.0. She was told to hold last nights dose. She would like to know what she should start back on? She was taking Coumadin '5mg'$  po qd.

## 2022-06-09 ENCOUNTER — Telehealth: Payer: Self-pay

## 2022-06-09 ENCOUNTER — Encounter: Payer: Self-pay | Admitting: Family Medicine

## 2022-06-09 ENCOUNTER — Ambulatory Visit (INDEPENDENT_AMBULATORY_CARE_PROVIDER_SITE_OTHER): Payer: Medicare PPO | Admitting: Family Medicine

## 2022-06-09 VITALS — BP 140/70 | HR 91 | Temp 97.7°F | Resp 15 | Ht 60.0 in | Wt 168.0 lb

## 2022-06-09 DIAGNOSIS — M79641 Pain in right hand: Secondary | ICD-10-CM | POA: Diagnosis not present

## 2022-06-09 DIAGNOSIS — Z23 Encounter for immunization: Secondary | ICD-10-CM | POA: Diagnosis not present

## 2022-06-09 DIAGNOSIS — N3000 Acute cystitis without hematuria: Secondary | ICD-10-CM | POA: Diagnosis not present

## 2022-06-09 LAB — POCT URINALYSIS DIP (CLINITEK)
Bilirubin, UA: NEGATIVE
Blood, UA: NEGATIVE
Glucose, UA: 500 mg/dL — AB
Ketones, POC UA: NEGATIVE mg/dL
Nitrite, UA: NEGATIVE
POC PROTEIN,UA: NEGATIVE
Spec Grav, UA: 1.01 (ref 1.010–1.025)
Urobilinogen, UA: 0.2 E.U./dL
pH, UA: 6.5 (ref 5.0–8.0)

## 2022-06-09 MED ORDER — CIPROFLOXACIN HCL 250 MG PO TABS: 250.0000 mg | ORAL_TABLET | Freq: Two times a day (BID) | ORAL | 0 refills | Status: AC

## 2022-06-09 NOTE — Patient Outreach (Signed)
  Care Coordination   Initial Visit Note   06/09/2022 Name: Pamela [name] MRN: [id number] DOB: May 11, 1946  Pamela [name] Pamela [name] is a 76 y.o. year old female who sees Cox, Elnita Maxwell, MD for primary care. I spoke with  Pamela [name] by phone today.  What matters to the patients health and wellness today?  Placed call to patient today and explained and offer [facility] care coordination program.  Patient reported " I need help with everything"  I offered to make an appointment for assessment and patient states that she wanted to wait to talk to her MD today in the office.  Patient declined at this time.    Goals Addressed               This Visit's Progress     Care Coordination (pt-stated)        Care Coordination Interventions: Secure chat sent to MD to talk to patient about Care Coordination program.          SDOH assessments and interventions completed:  No     Care Coordination Interventions Activated:  No  Care Coordination Interventions:  No, not indicated   Follow up plan: No further intervention required.   Encounter Outcome:  Pt. Refused   Tomasa Rand, RN, BSN, CEN [facility] [employer] [phone]

## 2022-06-09 NOTE — Progress Notes (Signed)
Subjective:  Patient ID: Pamela [name], female    DOB: March 21, 1946  Age: 76 y.o. MRN: [id number]  Chief Complaint  Patient presents with   Pyelonephritis   tooth abscess    HPI Patient was seen at ED [place] on 06/06/2022 for Chest pain. She was diagnosed with Acute phyelonephritis and supratherapeutic INR. Patient still getting extremely tired and some chills.  They did UA and showed cystitis. CT Showed pyelonephritis on Right kidney. She received IV antibiotics. She was discharged home stable with antibiotics Keflex 500 mg 4 times a day, Zofran 4 mg every 8 hours. Patient stated that she is feeling a little better with treatment. She is having issues with a tooth abscess. Dentist sent Clindamycin 300 mg every 6 hours for abscess on her tooth. She also is not taking Warfarin for INR too high. Last INR 4.5. Checking daily and being managed by Rosanne Sack, PA.   Current Outpatient Medications on File Prior to Visit  Medication Sig Dispense Refill   albuterol (VENTOLIN HFA) 108 (90 Base) MCG/ACT inhaler INHALE 1 TO 2 PUFFS INTO THE LUNGS EVERY 6 HOURS AS NEEDED FOR WHEEZING OR SHORTNESS OF BREATH 6.7 g 3   amLODipine (NORVASC) 5 MG tablet TAKE 1 TABLET(5 MG) BY MOUTH DAILY 90 tablet 1   aspirin EC (ASPIRIN LOW DOSE) 81 MG tablet Take 1 tablet (81 mg total) by mouth daily. 90 tablet 0   atorvastatin (LIPITOR) 80 MG tablet TAKE 1 TABLET(80 MG) BY MOUTH DAILY 90 tablet 0   azelastine (ASTELIN) 0.1 % nasal spray Place 2 sprays into both nostrils daily as needed for rhinitis or allergies. 30 mL 6   B Complex-C (B-COMPLEX WITH VITAMIN C) tablet Take 1 tablet by mouth daily with lunch.     Calcium Citrate-Vitamin D (CALCIUM CITRATE + PO) Take by mouth.     Cholecalciferol (VITAMIN D3) 50 MCG (2000 UT) TABS Take 4,000 Units by mouth daily with lunch.     Continuous Blood Gluc Sensor (FREESTYLE LIBRE 2 SENSOR) MISC 2 each by Does not apply route every 14 (fourteen)  days. E11.40 6 each 3   dapagliflozin propanediol (FARXIGA) 10 MG TABS tablet Take 1 tablet (10 mg total) by mouth daily before breakfast. 90 tablet 3   denosumab (PROLIA) 60 MG/ML SOSY injection 60 mg by sub-q route.     estradiol (ESTRACE) 0.1 MG/GM vaginal cream Place 1 Applicatorful vaginally 2 (two) times a week. Uses twice weekly     ferrous sulfate 325 (65 FE) MG tablet Take 325 mg by mouth daily with lunch.     GEMTESA 75 MG TABS Take by mouth.     Insulin Aspart FlexPen (NOVOLOG) 100 UNIT/ML Max daily 30 units 15 mL 6   insulin glargine, 1 Unit Dial, (TOUJEO) 300 UNIT/ML Solostar Pen Inject 8 Units into the skin at bedtime. 15 mL 3   Insulin Pen Needle (BD PEN NEEDLE NANO 2ND GEN) 32G X 4 MM MISC USE DAILY AS DIRECTED 200 each 3   ipratropium-albuterol (DUONEB) 0.5-2.5 (3) MG/3ML SOLN Take 3 mLs by nebulization 4 (four) times daily as needed (wheezing/shortness of breath).  0   lisinopril (ZESTRIL) 20 MG tablet TAKE 2 TABLETS(40 MG) BY MOUTH TWICE DAILY 180 tablet 1   magnesium oxide (MAG-OX) 400 (240 Mg) MG tablet TAKE 2 TABLETS(800 MG) BY MOUTH TWICE DAILY (Patient taking differently: Take 800 mg by mouth 2 (two) times daily.) 120 tablet 2   methocarbamol (ROBAXIN) 750 MG tablet  Take 1 tablet by mouth 3 (three) times daily.     Multiple Vitamins-Minerals (PRESERVISION AREDS 2 PO) Take by mouth.     nystatin cream (MYCOSTATIN) APPLY TO AREA OF RED IRRITATED RASH 3 TO 4 TIMES A DAY FOR SKIN YEAST INFECTION     ondansetron (ZOFRAN) 4 MG tablet Take 1 tablet (4 mg total) by mouth every 8 (eight) hours as needed for nausea or vomiting. 15 tablet 0   ondansetron (ZOFRAN-ODT) 8 MG disintegrating tablet Take 8 mg by mouth every 8 (eight) hours as needed for nausea or vomiting.      oxyCODONE-acetaminophen (PERCOCET/ROXICET) 5-325 MG tablet Take 1 tablet by mouth every 6 (six) hours as needed.     pantoprazole (PROTONIX) 40 MG tablet TAKE 1 TABLET(40 MG) BY MOUTH EVERY MORNING 90 tablet 3    polyethylene glycol (MIRALAX / GLYCOLAX) 17 g packet Take 17 g by mouth as needed. Uses 1-2 times weekly     traZODone (DESYREL) 50 MG tablet Take 1 tablet (50 mg total) by mouth at bedtime as needed for sleep. 90 tablet 0   warfarin (COUMADIN) 1 MG tablet Take 1 tablet (1 mg total) by mouth as directed. 120 tablet 2   clindamycin (CLEOCIN) 300 MG capsule Take 300 mg by mouth every 6 (six) hours. (Patient not taking: Reported on 06/09/2022)     furosemide (LASIX) 40 MG tablet Take 1 tablet (40 mg total) by mouth daily. 90 tablet 3   isosorbide mononitrate (IMDUR) 30 MG 24 hr tablet Take 1 tablet (30 mg total) by mouth daily. 90 tablet 3   warfarin (COUMADIN) 5 MG tablet TAKE 1 TABLET BY MOUTH EVERY DAY (Patient not taking: Reported on 06/09/2022) 90 tablet 1   No current facility-administered medications on file prior to visit.   Past Medical History:  Diagnosis Date   Anticoagulated on Coumadin    chronic--- managed by hematology/ oncology,   Asthma, mild intermittent    followed by pcp--- per pt last exacerbation winter 2021 w/ acute bronchitis   Bilateral leg cramps    Chronic constipation    CKD (chronic kidney disease), stage III ([place])    Closed bimalleolar fracture of left ankle 02/26/2020   DDD (degenerative disc disease), cervical    w/ spondylosis,  per pt last steroid injection 06/ 2022   DDD (degenerative disc disease), lumbosacral    DVT (deep venous thrombosis) ([place]) 07/30/2013   Dyspnea    occasionally   GERD (gastroesophageal reflux disease)    History of cardiac murmur as a child    History of DVT of lower extremity    left lower extremity in 1980s, fell when bowling   History of pulmonary embolus (PE) 1993   per pt left lung post op 2 wks cholecystectomy   History of rheumatic fever as a child    per last echo 12-31-2019 no valvular issues   History of TIA (transient ischemic attack)    2014 and 2018 or 2019,  per pt no residual   HTN (hypertension)    followed by  pcp   Hyperlipidemia    IDA (iron deficiency anemia)    Macular degeneration of both eyes    Mild obstructive sleep apnea    per pt dx 2017 tried to uses cpap but intolerant   Mixed incontinence urge and stress    urologist--- dr Nila Nephew   OA (osteoarthritis) 07/06/2018   Osteoporosis    PAF (paroxysmal atrial fibrillation) ([place]) 10/01/2014   cardiologist--- dr Godfrey Pick  [place];   cardiac cath 02-18-2013 normal coronaries arteries, ef 50%, cath done since echo showed ef 30-35%; nucleat stress study 04/ 2020 normal , normal echo 04/ 2021,  event monitor 09-14-2020 rare ST/AT variable block   PONV (postoperative nausea and vomiting)    Protein S deficiency ([place])    followed by hemotology/ oncology-- dr d. Bobby Rumpf ([facility]) dx 1980s;  prior DVT left lower leg 1980s and left lung PE 1993; chronic  coumadin since 1980s   PVC's (premature ventricular contractions)    followed by cardiology   S/P cardiac catheterization 02/2013   Normal coronaries; low normal EF at 50%   Solitary pulmonary nodule on lung CT 02/06/2019   First noted 01/13/2014 > no change as of 12/21/2018   Spondylolisthesis, lumbar region 08/09/2018   Stroke ([place])    TIA in 2018 or 2019   Transient ischemic attack 07/30/2013   Type 2 diabetes mellitus treated with insulin ([place])    endocrilogist--- whitney reardon NP     (03-10-2021  pt continuously checks blood sugar throughout the day w/ Libre, fasting sugar --- 69--200)   Wears glasses    Wears hearing aid in both ears    Past Surgical History:  Procedure Laterality Date   ABDOMINAL HYSTERECTOMY     BLEPHAROPLASTY     CARDIAC CATHETERIZATION  02/18/2013   @ [place]  by Dr Martinique;  normal coronaries w/ preserved LVF, ef 50%;   previous cath 2001 normal ef 65%   CARPAL TUNNEL RELEASE Bilateral 1994   CATARACT EXTRACTION W/ INTRAOCULAR LENS IMPLANT Bilateral 2017   CHOLECYSTECTOMY, LAPAROSCOPIC  1993   COLONOSCOPY     EAR BIOPSY Left    FINGER SURGERY Left 2018    thumb   FOOT SURGERY     FOOT TENDON SURGERY Right    early 2000s   HAND SURGERY     HARDWARE REMOVAL Left 03/12/2021   Procedure: HARDWARE REMOVAL;  Surgeon: Nicholes Stairs, MD;  Location[facility];  Service: Orthopedics;  Laterality: Left;  60 MINS   ORIF ANKLE FRACTURE Left 02/26/2020   Procedure: OPEN REDUCTION INTERNAL FIXATION (ORIF) ANKLE FRACTURE;  Surgeon: Nicholes Stairs, MD;  Location: [place];  Service: Orthopedics;  Laterality: Left;  90 mins   TONSILLECTOMY AND ADENOIDECTOMY Bilateral    TOTAL KNEE ARTHROPLASTY Left 03/2005   TOTAL VAGINAL HYSTERECTOMY  1988   per pt still has ovaries    Family History  Problem Relation Age of Onset   Cirrhosis Mother    Antithrombin III deficiency Mother        multiple emboli   Ulcerative colitis Mother    Diabetes Father    Coronary artery disease Father    Heart attack Father    Hypertension Father    Kidney disease Father    Hypertension Sister    Diabetes Sister    Heart attack Sister        age 27    Protein S deficiency Daughter    Heart attack Brother    Hypertension Brother    Lung cancer Brother    Stroke Neg Hx    Colitis Neg Hx    Colon polyps Neg Hx    Esophageal cancer Neg Hx    Liver cancer Neg Hx    Pancreatic cancer Neg Hx    Rectal cancer Neg Hx    Stomach cancer Neg Hx    Breast cancer Neg Hx    Social History  Socioeconomic History   Marital status: Married    Spouse name: [name]   Number of children: Not on file   Years of education: Not on file   Highest education level: Not on file  Occupational History   Not on file  Tobacco Use   Smoking status: Never   Smokeless tobacco: Never  Vaping Use   Vaping Use: Never used  Substance and Sexual Activity   Alcohol use: No   Drug use: Never   Sexual activity: Not on file  Other Topics Concern   Not on file  Social History Narrative   Lives with spouse in [place].  2 grown daughters.   Retired Glass blower/designer      Social Determinants of [place] Strain: Low Risk  (05/25/2022)   Overall Financial Resource Strain (CARDIA)    Difficulty of Paying Living Expenses: Not hard at all  Food Insecurity: No Food Insecurity (09/30/2020)   Hunger Vital Sign    Worried About Running Out of Food in the Last Year: Never true    [place] in the Last Year: Never true  Transportation Needs: No Transportation Needs (03/23/2022)   PRAPARE - Hydrologist (Medical): No    Lack of Transportation (Non-Medical): No  Physical Activity: Inactive (05/25/2022)   Exercise Vital Sign    Days of Exercise per Week: 0 days    Minutes of Exercise per Session: 0 min  Stress: Not on file  Social Connections: Not on file    Review of Systems  Constitutional:  Positive for fatigue. Negative for chills and fever.  HENT:  Negative for congestion, ear pain and sore throat.   Respiratory:  Negative for cough and shortness of breath.   Cardiovascular:  Negative for chest pain and palpitations.  Gastrointestinal:  Negative for abdominal pain, constipation, diarrhea, nausea and vomiting.  Endocrine: Negative for polydipsia, polyphagia and polyuria.  Genitourinary:  Negative for difficulty urinating and dysuria.  Musculoskeletal:  Negative for arthralgias, back pain and myalgias.  Skin:  Negative for rash.  Neurological:  Negative for headaches.  Psychiatric/Behavioral:  Negative for dysphoric mood. The patient is not nervous/anxious.      Objective:  BP (!) 140/70   Pulse 91   Temp 97.7 F (36.5 C)   Resp 15   Ht 5' (1.524 m)   Wt 168 lb (76.2 kg)   SpO2 96%   BMI 32.81 kg/m      06/09/2022    2:57 PM 06/06/2022    4:00 PM 06/06/2022    3:00 PM  BP/Weight  Systolic BP 809 983 382  Diastolic BP 70 71 81  Wt. (Lbs) 168    BMI 32.81 kg/m2      Physical Exam Vitals reviewed.  Constitutional:      Appearance: Normal appearance.  Neck:     Vascular: No carotid  bruit.  Cardiovascular:     Rate and Rhythm: Normal rate and regular rhythm.     Heart sounds: Normal heart sounds.  Pulmonary:     Effort: Pulmonary effort is normal. No respiratory distress.     Breath sounds: Normal breath sounds.  Abdominal:     General: Abdomen is flat. Bowel sounds are normal.     Palpations: Abdomen is soft.     Tenderness: There is no abdominal tenderness.  Neurological:     Mental Status: She is alert and oriented to person, place, and time.  Psychiatric:  Mood and Affect: Mood normal.        Behavior: Behavior normal.     Diabetic Foot Exam - Simple   No data filed      Lab Results  Component Value Date   WBC 11.5 (H) 06/06/2022   HGB 12.1 06/06/2022   HCT 35.6 (L) 06/06/2022   PLT 307 06/06/2022   GLUCOSE 204 (H) 06/06/2022   CHOL 143 05/02/2022   TRIG 86 05/02/2022   HDL 70 05/02/2022   LDLCALC 57 05/02/2022   ALT 41 06/06/2022   AST 31 06/06/2022   NA 132 (L) 06/06/2022   K 3.5 06/06/2022   CL 95 (L) 06/06/2022   CREATININE 1.14 (H) 06/06/2022   BUN 25 (H) 06/06/2022   CO2 29 06/06/2022   TSH 1.480 05/02/2022   INR 4.5 (HH) 06/06/2022   HGBA1C 7.6 (A) 05/27/2022   MICROALBUR 10 06/15/2021      Assessment & Plan:   Problem List Items Addressed This Visit       Genitourinary   Acute cystitis without hematuria - Primary    Rechecked UA since I could not find a urine culture. UA still looks concerning. Change keflex to cipro.       Relevant Orders   POCT URINALYSIS DIP (CLINITEK) (Completed)   Other Visit Diagnoses     Need for immunization against influenza       Relevant Orders   Flu Vaccine QUAD High Dose(Fluad) (Completed)     .  Meds ordered this encounter  Medications   ciprofloxacin (CIPRO) 250 MG tablet    Sig: Take 1 tablet (250 mg total) by mouth 2 (two) times daily for 7 days.    Dispense:  14 tablet    Refill:  0    Orders Placed This Encounter  Procedures   Flu Vaccine QUAD High  Dose(Fluad)   POCT URINALYSIS DIP (CLINITEK)   Follow-up: Return in about 4 months (around 10/10/2022) for chronic fasting.  An After Visit Summary was printed and given to the patient.  [name], MD [facility] [phone]

## 2022-06-09 NOTE — Patient Instructions (Signed)
Change keflex to cipro.

## 2022-06-09 NOTE — Telephone Encounter (Addendum)
Dr [name] switched her to Cipro, so she will hold coumadin and recheck Monday as per previous note below. Gabriel Rung agreed @ 1705-awc. ----- Message from Marvia Pickles, PA-C sent at 06/09/2022  9:11 AM EDT ----- Regarding: RE: INR 4.1 I called her but she was at [facility] and they had a fire drill (so not here) and had to get off the phone. I told her to continue to hold, we were debated whether or not to check Sat and start 2.'5mg'$  daily if INR less than 2, but if she is still on Keflex, we can wait until Monday to check.  ----- Message ----- From: Dairl Ponder, RN Sent: 06/09/2022   8:50 AM EDT To: Thalia Bloodgood Mosher, PA-C Subject: INR 4.1                                        Pt called this am w/INR is 4.1 ( 3 days without warfarin). Please advise.

## 2022-06-11 LAB — CULTURE, BLOOD (ROUTINE X 2)
Culture: NO GROWTH
Culture: NO GROWTH
Special Requests: ADEQUATE
Special Requests: ADEQUATE

## 2022-06-12 DIAGNOSIS — N3000 Acute cystitis without hematuria: Secondary | ICD-10-CM

## 2022-06-12 HISTORY — DX: Acute cystitis without hematuria: N30.00

## 2022-06-12 NOTE — Assessment & Plan Note (Addendum)
Rechecked UA since I could not find a urine culture. UA still looks concerning. Change keflex to cipro.

## 2022-06-13 ENCOUNTER — Telehealth: Payer: Self-pay

## 2022-06-13 NOTE — Telephone Encounter (Signed)
@   53- Dr Bobby Rumpf recommends pt restart taking Coumadin 5 mg po qd. She will recheck on Thursday and call us with results.  Pt notified and verbalized understanding.  '@0814'$  - I will notify Dr Bobby Rumpf of INR results when he comes in this morning. Pt has been off her coumadin since last Monday due to INR running 4.1-5.7. She had pyelonephritis and a tooth abscess. She was on 2 antibiotics which most likely affected her coumadin levels. Pt checked her INR last night and it was 1.1.

## 2022-06-15 DIAGNOSIS — M25641 Stiffness of right hand, not elsewhere classified: Secondary | ICD-10-CM | POA: Diagnosis not present

## 2022-06-16 ENCOUNTER — Telehealth: Payer: Self-pay

## 2022-06-16 NOTE — Telephone Encounter (Signed)
I told pt that I would notify Kelli,PA, and we would call her back.

## 2022-06-17 NOTE — Telephone Encounter (Signed)
06/17/22- Called and lvm to return my call    Elray Mcgregor, [place] Pharmacist Assistant  [phone]

## 2022-06-17 NOTE — Telephone Encounter (Signed)
INR 1.1 on coumadin 5 mg daily since 10/9. Thank you for letting her know to alternate 6 mg with 5 mg and repeat home INR on 10/17.

## 2022-06-20 NOTE — Telephone Encounter (Signed)
Information documented on a different note

## 2022-06-20 NOTE — Telephone Encounter (Signed)
06/20/2022  INR 1.5 this morning. She is alternating Coumadin '6mg'$ , with '5mg'$ .

## 2022-06-21 ENCOUNTER — Encounter: Payer: Self-pay | Admitting: Oncology

## 2022-06-22 ENCOUNTER — Other Ambulatory Visit: Payer: Self-pay | Admitting: Cardiology

## 2022-06-22 DIAGNOSIS — M25641 Stiffness of right hand, not elsewhere classified: Secondary | ICD-10-CM | POA: Diagnosis not present

## 2022-06-22 NOTE — Telephone Encounter (Signed)
06/20/22- Called 2nd attempt and lvm   Elray Mcgregor, [place] Pharmacist Assistant  [phone]

## 2022-06-28 ENCOUNTER — Telehealth: Payer: Self-pay

## 2022-06-28 NOTE — Telephone Encounter (Signed)
Pt called. Her INR is 3.0. Please advise.

## 2022-06-30 DIAGNOSIS — N390 Urinary tract infection, site not specified: Secondary | ICD-10-CM | POA: Diagnosis not present

## 2022-06-30 DIAGNOSIS — M25641 Stiffness of right hand, not elsewhere classified: Secondary | ICD-10-CM | POA: Diagnosis not present

## 2022-06-30 DIAGNOSIS — M13841 Other specified arthritis, right hand: Secondary | ICD-10-CM | POA: Diagnosis not present

## 2022-06-30 DIAGNOSIS — Z4789 Encounter for other orthopedic aftercare: Secondary | ICD-10-CM | POA: Diagnosis not present

## 2022-07-04 DIAGNOSIS — M25641 Stiffness of right hand, not elsewhere classified: Secondary | ICD-10-CM | POA: Diagnosis not present

## 2022-07-04 NOTE — Telephone Encounter (Signed)
07/04/22- Called pt and she stated her sugars have been up and down since she was working with her brother in the [place] and she was not eating what she was supposed to so they were in the high 200's. She was driving and did not have any specific readings but she said in the last week since she has been home they have been in 140-170 range and feels a lot better!   Elray Mcgregor, [place] Pharmacist Assistant  [phone]

## 2022-07-05 ENCOUNTER — Telehealth: Payer: Self-pay

## 2022-07-05 NOTE — Telephone Encounter (Signed)
Spoke with patient and gave her [employer].

## 2022-07-05 NOTE — Telephone Encounter (Signed)
-----   Message from Marvia Pickles, PA-C sent at 07/05/2022  1:06 PM EDT ----- Regarding: RE: INR Would you call her and have her continue same dose? Warfarin 6 mg Monday and Friday, 5 mg rest of days. Repeat home INR in 1 week. Thanks! ----- Message ----- From: Georgette Shell, RN Sent: 07/05/2022  12:55 PM EDT To: Marvia Pickles, PA-C Subject: INR                                            INR last night 2.5.

## 2022-07-06 ENCOUNTER — Telehealth: Payer: Self-pay

## 2022-07-06 ENCOUNTER — Other Ambulatory Visit: Payer: Self-pay | Admitting: Hematology and Oncology

## 2022-07-06 MED ORDER — ENOXAPARIN SODIUM 80 MG/0.8ML IJ SOSY: 80.0000 mg | PREFILLED_SYRINGE | Freq: Two times a day (BID) | INTRAMUSCULAR | 0 refills | Status: DC

## 2022-07-06 NOTE — Telephone Encounter (Addendum)
I called pt. She weighed right then. She weighs 160.8 ----- Message from Marvia Pickles, PA-C sent at 07/06/2022  4:01 PM EDT ----- Regarding: RE: Dental procedure on 9th Contact: [phone] She always needs to bridge when she is off coumadin, too bad she couldn't have done this right after her surgery. Has her weight changed? ----- Message ----- From: Dairl Ponder, RN Sent: 07/06/2022  12:12 PM EDT To: Marvia Pickles, PA-C Subject: Dental procedure on 9th                        Pt called to report that she is having some dental work on the 9th. They have asked her to stop her coumadin for 1 week prior. She wants to know if she needs to bridge?

## 2022-07-07 NOTE — Telephone Encounter (Signed)
-----   Message from Marvia Pickles, PA-C sent at 07/07/2022  2:42 PM EDT ----- Regarding: FW: Dental procedure on 9th Contact: [phone]  ----- Message ----- From: Marvia Pickles, PA-C Sent: 07/06/2022   5:18 PM EDT To: Dairl Ponder, RN Subject: RE: Dental procedure on 9th                    Rx for enoxaparin sent in to [facility], 80 mg sq q12hrs starting 1st day of holding coumadin. Hold the evening dose the night before the procedure. Resume both enoxaparin and coumadin the night of the procedure if dentist feels that is ok. Otherwise call us. She should repeat her INR 5 days after restarting coumadin. Does not need to check INR while off coumadin. Thanks   ----- Message ----- From: Dairl Ponder, RN Sent: 07/06/2022   4:14 PM EDT To: Marvia Pickles, PA-C Subject: RE: Dental procedure on 9th                    I called pt. She weighed right then. She weighs 160.8 ----- Message ----- From: Marvia Pickles, PA-C Sent: 07/06/2022   4:02 PM EDT To: Dairl Ponder, RN Subject: RE: Dental procedure on 9th                    She always needs to bridge when she is off coumadin, too bad she couldn't have done this right after her surgery. Has her weight changed? ----- Message ----- From: Dairl Ponder, RN Sent: 07/06/2022  12:12 PM EDT To: Marvia Pickles, PA-C Subject: Dental procedure on 9th                        Pt called to report that she is having some dental work on the 9th. They have asked her to stop her coumadin for 1 week prior. She wants to know if she needs to bridge?

## 2022-07-07 NOTE — Telephone Encounter (Signed)
Gave Pamela [name] the instructions, she acknowledge an understand of all.

## 2022-07-08 ENCOUNTER — Telehealth: Payer: Self-pay | Admitting: Family Medicine

## 2022-07-08 NOTE — Progress Notes (Signed)
Chronic Care Management Pharmacy Assistant   Name: Pamela [name]  MRN: [id number] DOB: 1946-01-09   Reason for Encounter: Disease State call for DM    Recent office visits:  \06/09/22 [name] MD. Seen for cystitis. Started on Ciprofloxacin HCI '250mg'$ .   Recent consult visits:  07/06/22 (Oncology) Rosanne Sack PA-C. Orders Only. Started on Enoxaparin '80mg'$ .   06/16/22 (Oncology) [place], [place]. Telephone message. Start Coumadin '5mg'$  on Tues and Fridays, '6mg'$  all other days   05/27/22 (Endocrinology) Nena Jordan MD. Seen for DM. Increase farxiga 10 mg , 1 tablet every morning  Decrease Toujeo 8 units once daily   Hospital visits:  Medication Reconciliation was completed by comparing discharge summary, patient's EMR and Pharmacy list, and upon discussion with patient.  Admitted to the hospital on 06/06/22 due to Acute Pyelonephritis. Discharge date was 06/06/22. Discharged from [facility]?Medications Started at [facility] Discharge:?? -started cephALEXin (KEFLEX) 500 MG capsule, ondansetron (ZOFRAN) 4 MG tablet   -All other medications will remain the same.    Medications: Outpatient Encounter Medications as of 07/08/2022  Medication Sig   albuterol (VENTOLIN HFA) 108 (90 Base) MCG/ACT inhaler INHALE 1 TO 2 PUFFS INTO THE LUNGS EVERY 6 HOURS AS NEEDED FOR WHEEZING OR SHORTNESS OF BREATH   amLODipine (NORVASC) 5 MG tablet TAKE 1 TABLET(5 MG) BY MOUTH DAILY   aspirin EC (ASPIRIN LOW DOSE) 81 MG tablet Take 1 tablet (81 mg total) by mouth daily.   atorvastatin (LIPITOR) 80 MG tablet TAKE 1 TABLET(80 MG) BY MOUTH DAILY   azelastine (ASTELIN) 0.1 % nasal spray Place 2 sprays into both nostrils daily as needed for rhinitis or allergies.   B Complex-C (B-COMPLEX WITH VITAMIN C) tablet Take 1 tablet by mouth daily with lunch.   Calcium Citrate-Vitamin D (CALCIUM CITRATE + PO) Take by mouth.   Cholecalciferol (VITAMIN D3) 50 MCG (2000  UT) TABS Take 4,000 Units by mouth daily with lunch.   clindamycin (CLEOCIN) 300 MG capsule Take 300 mg by mouth every 6 (six) hours. (Patient not taking: Reported on 06/09/2022)   Continuous Blood Gluc Sensor (FREESTYLE LIBRE 2 SENSOR) MISC 2 each by Does not apply route every 14 (fourteen) days. E11.40   dapagliflozin propanediol (FARXIGA) 10 MG TABS tablet Take 1 tablet (10 mg total) by mouth daily before breakfast.   denosumab (PROLIA) 60 MG/ML SOSY injection 60 mg by sub-q route.   enoxaparin (LOVENOX) 80 MG/0.8ML injection Inject 0.8 mLs (80 mg total) into the skin every 12 (twelve) hours.   estradiol (ESTRACE) 0.1 MG/GM vaginal cream Place 1 Applicatorful vaginally 2 (two) times a week. Uses twice weekly   ferrous sulfate 325 (65 FE) MG tablet Take 325 mg by mouth daily with lunch.   furosemide (LASIX) 40 MG tablet TAKE 1 TABLET(40 MG) BY MOUTH DAILY   GEMTESA 75 MG TABS Take by mouth.   Insulin Aspart FlexPen (NOVOLOG) 100 UNIT/ML Max daily 30 units   insulin glargine, 1 Unit Dial, (TOUJEO) 300 UNIT/ML Solostar Pen Inject 8 Units into the skin at bedtime.   Insulin Pen Needle (BD PEN NEEDLE NANO 2ND GEN) 32G X 4 MM MISC USE DAILY AS DIRECTED   ipratropium-albuterol (DUONEB) 0.5-2.5 (3) MG/3ML SOLN Take 3 mLs by nebulization 4 (four) times daily as needed (wheezing/shortness of breath).   isosorbide mononitrate (IMDUR) 30 MG 24 hr tablet Take 1 tablet (30 mg total) by mouth daily.   lisinopril (ZESTRIL) 20 MG tablet  TAKE 2 TABLETS(40 MG) BY MOUTH TWICE DAILY   magnesium oxide (MAG-OX) 400 (240 Mg) MG tablet TAKE 2 TABLETS(800 MG) BY MOUTH TWICE DAILY (Patient taking differently: Take 800 mg by mouth 2 (two) times daily.)   methocarbamol (ROBAXIN) 750 MG tablet Take 1 tablet by mouth 3 (three) times daily.   Multiple Vitamins-Minerals (PRESERVISION AREDS 2 PO) Take by mouth.   nystatin cream (MYCOSTATIN) APPLY TO AREA OF RED IRRITATED RASH 3 TO 4 TIMES A DAY FOR SKIN YEAST INFECTION    ondansetron (ZOFRAN) 4 MG tablet Take 1 tablet (4 mg total) by mouth every 8 (eight) hours as needed for nausea or vomiting.   ondansetron (ZOFRAN-ODT) 8 MG disintegrating tablet Take 8 mg by mouth every 8 (eight) hours as needed for nausea or vomiting.    oxyCODONE-acetaminophen (PERCOCET/ROXICET) 5-325 MG tablet Take 1 tablet by mouth every 6 (six) hours as needed.   pantoprazole (PROTONIX) 40 MG tablet TAKE 1 TABLET(40 MG) BY MOUTH EVERY MORNING   polyethylene glycol (MIRALAX / GLYCOLAX) 17 g packet Take 17 g by mouth as needed. Uses 1-2 times weekly   traZODone (DESYREL) 50 MG tablet Take 1 tablet (50 mg total) by mouth at bedtime as needed for sleep.   warfarin (COUMADIN) 1 MG tablet Take 1 tablet (1 mg total) by mouth as directed.   warfarin (COUMADIN) 5 MG tablet TAKE 1 TABLET BY MOUTH EVERY DAY (Patient not taking: Reported on 06/09/2022)   No facility-administered encounter medications on file as of 07/08/2022.    Recent Relevant Labs: Lab Results  Component Value Date/Time   HGBA1C 7.6 (A) 05/27/2022 11:45 AM   HGBA1C 7.2 (H) 01/24/2022 09:05 AM   HGBA1C 7.4 (H) 10/18/2021 08:55 AM   MICROALBUR 10 06/15/2021 04:46 PM   MICROALBUR 10 07/29/2020 12:01 PM    Kidney Function Lab Results  Component Value Date/Time   CREATININE 1.14 (H) 06/06/2022 01:02 PM   CREATININE 1.09 (H) 05/02/2022 10:13 AM   GFR 57.98 (L) 03/10/2020 04:46 PM   GFRNONAA 50 (L) 06/06/2022 01:02 PM   GFRAA 70 09/11/2020 11:23 AM     Current antihyperglycemic regimen:  Farxiga '10mg'$  daily  Novolog 100 units Max 30 daily Toujeo 300units- 8 units at bedtime  Patient verbally confirms she is taking the above medications as directed. Yes  What recent interventions/DTPs have been made to improve glycemic control:  No changes   Have there been any recent hospitalizations or ED visits since last visit with CPP? No  Patient denies hypoglycemic symptoms, including None  Patient denies hyperglycemic symptoms,  including none  How often are you checking your blood sugar? twice daily  What are your blood sugars ranging?  Fasting: 07/12/22 51, 69, 106, 07/11/2370, 109 Before meals: 07/11/22 80 After meals: 07/12/22 206, 247,07/11/22 250, 244, 207  On insulin? Yes How many units:Sliding Scale and 8 units  During the week, how often does your blood glucose drop below 70? Twice a week  Are you checking your feet daily/regularly? Yes  Question: Pt wants to know if the [place] sensors or the blood glucose watch is better at checking sugars. She stated she would love to go with the watch because the [name] will irritate her arm sometimes. Please advise!  Adherence Review: Is the patient currently on a STATIN medication? Yes Is the patient currently on ACE/ARB medication? Yes Does the patient have >5 day gap between last estimated fill dates? CPP to review  Care Gaps: Last eye exam / Retinopathy Screening?  10/21/21 Last Annual Wellness Visit? 02/15/22 Last Diabetic Foot Exam? 01/24/22   Star Rating Drugs:  Medication:  Last Fill: Day Supply Farxiga   06/01/22-05/27/22 [place], [place] Pharmacist Assistant  [phone]

## 2022-07-09 ENCOUNTER — Other Ambulatory Visit: Payer: Self-pay | Admitting: Cardiology

## 2022-07-11 ENCOUNTER — Other Ambulatory Visit: Payer: Self-pay | Admitting: Cardiology

## 2022-07-12 ENCOUNTER — Encounter: Payer: Self-pay | Admitting: Oncology

## 2022-07-12 ENCOUNTER — Telehealth: Payer: Self-pay

## 2022-07-12 DIAGNOSIS — M25641 Stiffness of right hand, not elsewhere classified: Secondary | ICD-10-CM | POA: Diagnosis not present

## 2022-07-12 DIAGNOSIS — M79641 Pain in right hand: Secondary | ICD-10-CM | POA: Diagnosis not present

## 2022-07-12 LAB — PROTIME-INR

## 2022-07-12 NOTE — Telephone Encounter (Addendum)
Pt notified of below and verbalized understanding. ----- Message from Marvia Pickles, PA-C sent at 07/12/2022  9:20 AM EST ----- Regarding: RE: INR 1.0 today Contact: [phone] OK, she is off coumadin, so really didn't need to do this. She is bridging with Lovenox and should repeat INR after 5 days back on coumadin. Thanks ----- Message ----- From: Dairl Ponder, RN Sent: 07/12/2022   9:11 AM EST To: Marvia Pickles, PA-C Subject: INR 1.0 today                                  INR 1.0 this am per pt.

## 2022-07-13 ENCOUNTER — Other Ambulatory Visit: Payer: Self-pay | Admitting: Hematology and Oncology

## 2022-07-13 ENCOUNTER — Telehealth: Payer: Self-pay

## 2022-07-13 MED ORDER — ENOXAPARIN SODIUM 80 MG/0.8ML IJ SOSY: 80.0000 mg | PREFILLED_SYRINGE | Freq: Two times a day (BID) | INTRAMUSCULAR | 0 refills | Status: DC

## 2022-07-13 NOTE — Telephone Encounter (Signed)
-----   Message from Marvia Pickles, PA-C sent at 07/13/2022 11:12 AM EST ----- Regarding: RE: Lovenox Sorry, I only gave her 5 days thinking she was only using the lovenox daily. I will send that in. She has to do that until the INR is therapeutic. She can check the INR once she has been back on coumadin for 5 days, but might still need lovenox. Thanks ----- Message ----- From: Dairl Ponder, RN Sent: 07/13/2022   9:40 AM EST To: Marvia Pickles, PA-C Subject: Lovenox                                        Pt LVM on nurse line that she doesn't have any Lovenox to take with the warfarin after procedure. She also wanted to know how many days will she do this?

## 2022-07-15 ENCOUNTER — Encounter: Payer: Self-pay | Admitting: Cardiology

## 2022-07-15 ENCOUNTER — Ambulatory Visit: Payer: Medicare PPO | Attending: Cardiology | Admitting: Cardiology

## 2022-07-15 VITALS — BP 132/68 | HR 74 | Ht 60.0 in | Wt 158.6 lb

## 2022-07-15 DIAGNOSIS — R252 Cramp and spasm: Secondary | ICD-10-CM | POA: Diagnosis not present

## 2022-07-15 DIAGNOSIS — I10 Essential (primary) hypertension: Secondary | ICD-10-CM

## 2022-07-15 MED ORDER — AMLODIPINE BESYLATE 5 MG PO TABS: ORAL_TABLET | ORAL | 3 refills | Status: DC

## 2022-07-15 MED ORDER — ATORVASTATIN CALCIUM 80 MG PO TABS: ORAL_TABLET | ORAL | 3 refills | Status: DC

## 2022-07-15 MED ORDER — LISINOPRIL 20 MG PO TABS: ORAL_TABLET | ORAL | 3 refills | Status: DC

## 2022-07-15 MED ORDER — ISOSORBIDE MONONITRATE ER 30 MG PO TB24: ORAL_TABLET | ORAL | 3 refills | Status: DC

## 2022-07-15 MED ORDER — FUROSEMIDE 40 MG PO TABS: 40.0000 mg | ORAL_TABLET | Freq: Every day | ORAL | 3 refills | Status: DC

## 2022-07-15 NOTE — Patient Instructions (Signed)
Medication Instructions:  Your physician recommends that you continue on your current medications as directed. Please refer to the Current Medication list given to you today.  Medication Refilled as reqeusted. *If you need a refill on your cardiac medications before your next appointment, please call your pharmacy*   Lab Work: none If you have labs (blood work) drawn today and your tests are completely normal, you will receive your results only by: [place] (if you have MyChart) OR A paper copy in the mail If you have any lab test that is abnormal or we need to change your treatment, we will call you to review the results.   Testing/Procedures: To be completed in [place] has requested that you have an ankle brachial index (ABI). During this test an ultrasound and blood pressure cuff are used to evaluate the arteries that supply the arms and legs with blood. Allow thirty minutes for this exam. There are no restrictions or special instructions.  Your physician has requested that you have a lower extremity arterial doppler- During this test, ultrasound is used to evaluate arterial blood flow in the legs. Allow approximately one hour for this exam.    Follow-Up: At [facility], you and your health needs are our priority.  As part of our continuing mission to provide you with exceptional heart care, we have created designated Provider Care Teams.  These Care Teams include your primary Cardiologist (physician) and Advanced Practice Providers (APPs -  Physician Assistants and Nurse Practitioners) who all work together to provide you with the care you need, when you need it.  We recommend signing up for the patient portal called "MyChart".  Sign up information is provided on this After Visit Summary.  MyChart is used to connect with patients for Virtual Visits (Telemedicine).  Patients are able to view lab/test results, encounter notes, upcoming appointments, etc.   Non-urgent messages can be sent to your provider as well.   To learn more about what you can do with MyChart, go to [email or web address].    Your next appointment:   12 year(s)  The format for your next appointment:   In Person  Provider:   Berniece Salines, DO     Other Instructions none  Important Information About [place]

## 2022-07-15 NOTE — Progress Notes (Unsigned)
Cardiology Office Note:    Date:  07/15/2022   ID:  Pamela [name], DOB December 20, 1945, MRN [id number]  PCP:  [name], MD  Cardiologist:  Berniece Salines, DO  Electrophysiologist:  None   Referring MD: [name], MD   No chief complaint on file. ***  History of Present Illness:    Pamela [name] is a 76 y.o. female with a hx of ***  Past Medical History:  Diagnosis Date   Anticoagulated on Coumadin    chronic--- managed by hematology/ oncology,   Asthma, mild intermittent    followed by pcp--- per pt last exacerbation winter 2021 w/ acute bronchitis   Bilateral leg cramps    Chronic constipation    CKD (chronic kidney disease), stage III ([place])    Closed bimalleolar fracture of left ankle 02/26/2020   DDD (degenerative disc disease), cervical    w/ spondylosis,  per pt last steroid injection 06/ 2022   DDD (degenerative disc disease), lumbosacral    DVT (deep venous thrombosis) ([place]) 07/30/2013   Dyspnea    occasionally   GERD (gastroesophageal reflux disease)    History of cardiac murmur as a child    History of DVT of lower extremity    left lower extremity in 1980s, fell when bowling   History of pulmonary embolus (PE) 1993   per pt left lung post op 2 wks cholecystectomy   History of rheumatic fever as a child    per last echo 12-31-2019 no valvular issues   History of TIA (transient ischemic attack)    2014 and 2018 or 2019,  per pt no residual   HTN (hypertension)    followed by pcp   Hyperlipidemia    IDA (iron deficiency anemia)    Macular degeneration of both eyes    Mild obstructive sleep apnea    per pt dx 2017 tried to uses cpap but intolerant   Mixed incontinence urge and stress    urologist--- dr Nila Nephew   OA (osteoarthritis) 07/06/2018   Osteoporosis    PAF (paroxysmal atrial fibrillation) ([place]) 10/01/2014   cardiologist--- dr Godfrey Pick tobb;   cardiac cath 02-18-2013 normal coronaries arteries, ef 50%, cath done since echo  showed ef 30-35%; nucleat stress study 04/ 2020 normal , normal echo 04/ 2021,  event monitor 09-14-2020 rare ST/AT variable block   PONV (postoperative nausea and vomiting)    Protein S deficiency ([place])    followed by hemotology/ oncology-- dr d. Bobby Rumpf ([facility]) dx 1980s;  prior DVT left lower leg 1980s and left lung PE 1993; chronic  coumadin since 1980s   PVC's (premature ventricular contractions)    followed by cardiology   S/P cardiac catheterization 02/2013   Normal coronaries; low normal EF at 50%   Solitary pulmonary nodule on lung CT 02/06/2019   First noted 01/13/2014 > no change as of 12/21/2018   Spondylolisthesis, lumbar region 08/09/2018   Stroke ([place])    TIA in 2018 or 2019   Transient ischemic attack 07/30/2013   Type 2 diabetes mellitus treated with insulin ([place])    endocrilogist--- whitney reardon NP     (03-10-2021  pt continuously checks blood sugar throughout the day w/ Libre, fasting sugar --- 69--200)   Wears glasses    Wears hearing aid in both ears     Past Surgical History:  Procedure Laterality Date   ABDOMINAL HYSTERECTOMY     BLEPHAROPLASTY     CARDIAC CATHETERIZATION  02/18/2013   @  [facility]  by Dr Martinique;  normal coronaries w/ preserved LVF, ef 50%;   previous cath 2001 normal ef 65%   CARPAL TUNNEL RELEASE Bilateral 1994   CATARACT EXTRACTION W/ INTRAOCULAR LENS IMPLANT Bilateral 2017   CHOLECYSTECTOMY, LAPAROSCOPIC  1993   COLONOSCOPY     EAR BIOPSY Left    FINGER SURGERY Left 2018   thumb   FOOT SURGERY     FOOT TENDON SURGERY Right    early 2000s   HAND SURGERY     HARDWARE REMOVAL Left 03/12/2021   Procedure: HARDWARE REMOVAL;  Surgeon: Nicholes Stairs, MD;  Location: [facility];  Service: Orthopedics;  Laterality: Left;  60 MINS   ORIF ANKLE FRACTURE Left 02/26/2020   Procedure: OPEN REDUCTION INTERNAL FIXATION (ORIF) ANKLE FRACTURE;  Surgeon: Nicholes Stairs, MD;  Location: [facility];  Service:  Orthopedics;  Laterality: Left;  90 mins   TONSILLECTOMY AND ADENOIDECTOMY Bilateral    TOTAL KNEE ARTHROPLASTY Left 03/2005   TOTAL VAGINAL HYSTERECTOMY  1988   per pt still has ovaries    Current Medications: Current Meds  Medication Sig   albuterol (VENTOLIN HFA) 108 (90 Base) MCG/ACT inhaler INHALE 1 TO 2 PUFFS INTO THE LUNGS EVERY 6 HOURS AS NEEDED FOR WHEEZING OR SHORTNESS OF BREATH   amLODipine (NORVASC) 5 MG tablet TAKE 1 TABLET(5 MG) BY MOUTH DAILY   aspirin EC (ASPIRIN LOW DOSE) 81 MG tablet Take 1 tablet (81 mg total) by mouth daily.   atorvastatin (LIPITOR) 80 MG tablet TAKE 1 TABLET(80 MG) BY MOUTH DAILY   azelastine (ASTELIN) 0.1 % nasal spray Place 2 sprays into both nostrils daily as needed for rhinitis or allergies.   B Complex-C (B-COMPLEX WITH VITAMIN C) tablet Take 1 tablet by mouth daily with lunch.   Calcium Citrate-Vitamin D (CALCIUM CITRATE + PO) Take by mouth.   Cholecalciferol (VITAMIN D3) 50 MCG (2000 UT) TABS Take 4,000 Units by mouth daily with lunch.   clindamycin (CLEOCIN) 300 MG capsule Take 300 mg by mouth every 6 (six) hours.   Continuous Blood Gluc Sensor (FREESTYLE LIBRE 2 SENSOR) MISC 2 each by Does not apply route every 14 (fourteen) days. E11.40   dapagliflozin propanediol (FARXIGA) 10 MG TABS tablet Take 1 tablet (10 mg total) by mouth daily before breakfast.   denosumab (PROLIA) 60 MG/ML SOSY injection 60 mg by sub-q route.   enoxaparin (LOVENOX) 80 MG/0.8ML injection Inject 0.8 mLs (80 mg total) into the skin every 12 (twelve) hours.   estradiol (ESTRACE) 0.1 MG/GM vaginal cream Place 1 Applicatorful vaginally 2 (two) times a week. Uses twice weekly   ferrous sulfate 325 (65 FE) MG tablet Take 325 mg by mouth daily with lunch.   furosemide (LASIX) 40 MG tablet TAKE 1 TABLET(40 MG) BY MOUTH DAILY   GEMTESA 75 MG TABS Take by mouth.   Insulin Aspart FlexPen (NOVOLOG) 100 UNIT/ML Max daily 30 units   insulin glargine, 1 Unit Dial, (TOUJEO) 300  UNIT/ML Solostar Pen Inject 8 Units into the skin at bedtime.   Insulin Pen Needle (BD PEN NEEDLE NANO 2ND GEN) 32G X 4 MM MISC USE DAILY AS DIRECTED   ipratropium-albuterol (DUONEB) 0.5-2.5 (3) MG/3ML SOLN Take 3 mLs by nebulization 4 (four) times daily as needed (wheezing/shortness of breath).   isosorbide mononitrate (IMDUR) 30 MG 24 hr tablet 1 tablet by mouth daily, 2nd attempt, please reach arrange appt for further refills   lisinopril (ZESTRIL) 20 MG tablet TAKE 2 TABLETS(40 MG)  BY MOUTH TWICE DAILY   magnesium oxide (MAG-OX) 400 (240 Mg) MG tablet TAKE 2 TABLETS(800 MG) BY MOUTH TWICE DAILY (Patient taking differently: Take 800 mg by mouth 2 (two) times daily.)   methocarbamol (ROBAXIN) 750 MG tablet Take 1 tablet by mouth 3 (three) times daily.   Multiple Vitamins-Minerals (PRESERVISION AREDS 2 PO) Take by mouth.   nystatin cream (MYCOSTATIN) APPLY TO AREA OF RED IRRITATED RASH 3 TO 4 TIMES A DAY FOR SKIN YEAST INFECTION   ondansetron (ZOFRAN) 4 MG tablet Take 1 tablet (4 mg total) by mouth every 8 (eight) hours as needed for nausea or vomiting.   ondansetron (ZOFRAN-ODT) 8 MG disintegrating tablet Take 8 mg by mouth every 8 (eight) hours as needed for nausea or vomiting.    oxyCODONE-acetaminophen (PERCOCET/ROXICET) 5-325 MG tablet Take 1 tablet by mouth every 6 (six) hours as needed.   pantoprazole (PROTONIX) 40 MG tablet TAKE 1 TABLET(40 MG) BY MOUTH EVERY MORNING   polyethylene glycol (MIRALAX / GLYCOLAX) 17 g packet Take 17 g by mouth as needed. Uses 1-2 times weekly   traZODone (DESYREL) 50 MG tablet Take 1 tablet (50 mg total) by mouth at bedtime as needed for sleep.   warfarin (COUMADIN) 1 MG tablet Take 1 tablet (1 mg total) by mouth as directed.   warfarin (COUMADIN) 5 MG tablet TAKE 1 TABLET BY MOUTH EVERY DAY     Allergies:   Ketek [telithromycin], Loxapine succinate, Naproxen sodium, Amoxapine and related, Darvon, Belsomra [suvorexant], Cymbalta [duloxetine hcl], Duloxetine,  Gabapentin, Lyrica [pregabalin], Amoxicillin, and Propoxyphene   Social History   Socioeconomic History   Marital status: Married    Spouse name: [name]   Number of children: Not on file   Years of education: Not on file   Highest education level: Not on file  Occupational History   Not on file  Tobacco Use   Smoking status: Never   Smokeless tobacco: Never  Vaping Use   Vaping Use: Never used  Substance and Sexual Activity   Alcohol use: No   Drug use: Never   Sexual activity: Not on file  Other Topics Concern   Not on file  Social History Narrative   Lives with spouse in [place].  2 grown daughters.   Retired Glass blower/designer     Social Determinants of [place] Strain: Low Risk  (05/25/2022)   Overall Financial Resource Strain (CARDIA)    Difficulty of Paying Living Expenses: Not hard at all  Food Insecurity: No Food Insecurity (09/30/2020)   Hunger Vital Sign    Worried About Running Out of Food in the Last Year: Never true    [place] in the Last Year: Never true  Transportation Needs: No Transportation Needs (03/23/2022)   PRAPARE - Hydrologist (Medical): No    Lack of Transportation (Non-Medical): No  Physical Activity: Inactive (05/25/2022)   Exercise Vital Sign    Days of Exercise per Week: 0 days    Minutes of Exercise per Session: 0 min  Stress: Not on file  Social Connections: Not on file     Family History: The patient's family history includes Antithrombin III deficiency in her mother; Cirrhosis in her mother; Coronary artery disease in her father; Diabetes in her father and sister; Heart attack in her brother, father, and sister; Hypertension in her brother, father, and sister; Kidney disease in her father; Lung cancer in her brother; Protein S deficiency in her  daughter; Ulcerative colitis in her mother. There is no history of Stroke, Colitis, Colon polyps, Esophageal cancer, Liver cancer, Pancreatic  cancer, Rectal cancer, Stomach cancer, or Breast cancer.  ROS:   Review of Systems  Constitution: Negative for decreased appetite, fever and weight gain.  HENT: Negative for congestion, ear discharge, hoarse voice and sore throat.   Eyes: Negative for discharge, redness, vision loss in right eye and visual halos.  Cardiovascular: Negative for chest pain, dyspnea on exertion, leg swelling, orthopnea and palpitations.  Respiratory: Negative for cough, hemoptysis, shortness of breath and snoring.   Endocrine: Negative for heat intolerance and polyphagia.  Hematologic/Lymphatic: Negative for bleeding problem. Does not bruise/bleed easily.  Skin: Negative for flushing, nail changes, rash and suspicious lesions.  Musculoskeletal: Negative for arthritis, joint pain, muscle cramps, myalgias, neck pain and stiffness.  Gastrointestinal: Negative for abdominal pain, bowel incontinence, diarrhea and excessive appetite.  Genitourinary: Negative for decreased libido, genital sores and incomplete emptying.  Neurological: Negative for brief paralysis, focal weakness, headaches and loss of balance.  Psychiatric/Behavioral: Negative for altered mental status, depression and suicidal ideas.  Allergic/Immunologic: Negative for HIV exposure and persistent infections.    EKGs/Labs/Other Studies Reviewed:    The following studies were reviewed today:   EKG:  The ekg ordered today demonstrates   Recent Labs: 10/18/2021: Magnesium 1.7 05/02/2022: TSH 1.480 06/06/2022: ALT 41; B Natriuretic Peptide 76.4; BUN 25; Creatinine, Ser 1.14; Hemoglobin 12.1; Platelets 307; Potassium 3.5; Sodium 132  Recent Lipid Panel    Component Value Date/Time   CHOL 143 05/02/2022 1013   TRIG 86 05/02/2022 1013   HDL 70 05/02/2022 1013   CHOLHDL 2.0 05/02/2022 1013   LDLCALC 57 05/02/2022 1013    Physical Exam:    VS:  BP 132/68   Pulse 74   Ht 5' (1.524 m)   Wt 158 lb 9.6 oz (71.9 kg)   SpO2 96%   BMI 30.97 kg/m      Wt Readings from Last 3 Encounters:  07/15/22 158 lb 9.6 oz (71.9 kg)  06/09/22 168 lb (76.2 kg)  06/06/22 166 lb (75.3 kg)     GEN: Well nourished, well developed in no acute distress HEENT: Normal NECK: No JVD; No carotid bruits LYMPHATICS: No lymphadenopathy CARDIAC: S1S2 noted,RRR, no murmurs, rubs, gallops RESPIRATORY:  Clear to auscultation without rales, wheezing or rhonchi  ABDOMEN: Soft, non-tender, non-distended, +bowel sounds, no guarding. EXTREMITIES: No edema, No cyanosis, no clubbing MUSCULOSKELETAL:  No deformity  SKIN: Warm and dry NEUROLOGIC:  Alert and oriented x 3, non-focal PSYCHIATRIC:  Normal affect, good insight  ASSESSMENT:    No diagnosis found. PLAN:     1.  The patient is in agreement with the above plan. The patient left the office in stable condition.  The patient will follow up in   Medication Adjustments/Labs and Tests Ordered: Current medicines are reviewed at length with the patient today.  Concerns regarding medicines are outlined above.  No orders of the defined types were placed in this encounter.  No orders of the defined types were placed in this encounter.   There are no Patient Instructions on file for this visit.   Adopting a Healthy Lifestyle.  Know what a healthy weight is for you (roughly BMI <25) and aim to maintain this   Aim for 7+ servings of fruits and vegetables daily   65-80+ fluid ounces of water or unsweet tea for healthy kidneys   Limit to max 1 drink of alcohol per day;  avoid smoking/tobacco   Limit animal fats in diet for cholesterol and heart health - choose grass fed whenever available   Avoid highly processed foods, and foods high in saturated/trans fats   Aim for low stress - take time to unwind and care for your mental health   Aim for 150 min of moderate intensity exercise weekly for heart health, and weights twice weekly for bone health   Aim for 7-9 hours of sleep daily   When it comes to  diets, agreement about the perfect plan isnt easy to find, even among the experts. Experts at the [employer] developed an idea known as the Healthy Eating Plate. Just imagine a plate divided into logical, healthy portions.   The emphasis is on diet quality:   Load up on vegetables and fruits - one-half of your plate: Aim for color and variety, and remember that potatoes dont count.   Go for whole grains - one-quarter of your plate: Whole wheat, barley, wheat berries, quinoa, oats, brown rice, and foods made with them. If you want pasta, go with whole wheat pasta.   Protein power - one-quarter of your plate: Fish, chicken, beans, and nuts are all healthy, versatile protein sources. Limit red meat.   The diet, however, does go beyond the plate, offering a few other suggestions.   Use healthy plant oils, such as olive, canola, soy, corn, sunflower and peanut. Check the labels, and avoid partially hydrogenated oil, which have unhealthy trans fats.   If youre thirsty, drink water. Coffee and tea are good in moderation, but skip sugary drinks and limit milk and dairy products to one or two daily servings.   The type of carbohydrate in the diet is more important than the amount. Some sources of carbohydrates, such as vegetables, fruits, whole grains, and beans-are healthier than others.   Finally, stay active  Signed, Erskine Emery, MD  07/15/2022 10:09 AM    [place]

## 2022-07-15 NOTE — Progress Notes (Signed)
Cardiology Office Note:    Date:  07/18/2022   ID:  Pamela [name], DOB [birth year], MRN [id number]  PCP:  [name], MD  Cardiologist:  Berniece Salines, DO  Electrophysiologist:  None   Referring MD: [name], MD    " I have had some chest pain"   History of Present Illness:    Pamela [name] is a 76 y.o. female with a hx of  Diabetes Mellitus, GERD, protein S deficiency chronic anticoagulation with Coumadin and has had DVT in the past, hypertension, hypercholesteremia, PVC, Paroxysmal atrial fibrillation history of a left heart catheterization with normal coronaries, history of TIA. She had a ORIF ankle fracture in June and status post open reduction internal fixation.   I last saw the patient in November 2021 at that time she was experiencing significant elevated blood pressure.  We discussed blood pressure control at that time I increased her lisinopril to 5 mg daily and she remain on her carvedilol 25 mg twice daily and her Lasix was twice daily as well.   I saw the patient on August 14, 2020 at that time she reports she has been experiencing some dizziness therefore I placed a monitor on the patient making sure that the symptoms were not caused due to an arrhythmia.  I also cut back on her Lasix from twice a day to once daily.   I saw her on 09/11/2020, at that time we increase the lisinopril to 20 mg daily. She is her today. She offers no complaints. She did read her blood pressures to me her systolic is averaging in the 150s to 160s.  She has had some dizziness but she did see ENT who said that this could be inner ear alignment issues.   I did see the patient via telemedicine on September 26, 2019 at that time she appeared to be doing well.  No medication changes were made.  I saw the patient Jan 25, 2021 at that time there have been changes to her medication prior to that visit.  Her carvedilol has been stopped her lisinopril was increased by her  primary care physician.  During that visit she appeared to be doing well from a cardiovascular standpoint we did not make any medication changes.  Her blood pressure was acceptable.  Since her last visit she has been active.  She even volunteered to help her brother-in-law at the state fair recently.  The only problem that she tells me she is experiencing some leg cramping.  Past Medical History:  Diagnosis Date   Anticoagulated on Coumadin    chronic--- managed by hematology/ oncology,   Asthma, mild intermittent    followed by pcp--- per pt last exacerbation winter 2021 w/ acute bronchitis   Bilateral leg cramps    Chronic constipation    CKD (chronic kidney disease), stage III ([place])    Closed bimalleolar fracture of left ankle 02/26/2020   DDD (degenerative disc disease), cervical    w/ spondylosis,  per pt last steroid injection 06/ 2022   DDD (degenerative disc disease), lumbosacral    DVT (deep venous thrombosis) ([place]) 07/30/2013   Dyspnea    occasionally   GERD (gastroesophageal reflux disease)    History of cardiac murmur as a child    History of DVT of lower extremity    left lower extremity in 1980s, fell when bowling   History of pulmonary embolus (PE) 1993   per pt left lung post op 2 wks cholecystectomy  History of rheumatic fever as a child    per last echo 12-31-2019 no valvular issues   History of TIA (transient ischemic attack)    2014 and 2018 or 2019,  per pt no residual   HTN (hypertension)    followed by pcp   Hyperlipidemia    IDA (iron deficiency anemia)    Macular degeneration of both eyes    Mild obstructive sleep apnea    per pt dx 2017 tried to uses cpap but intolerant   Mixed incontinence urge and stress    urologist--- dr Nila Nephew   OA (osteoarthritis) 07/06/2018   Osteoporosis    PAF (paroxysmal atrial fibrillation) ([place]) 10/01/2014   cardiologist--- dr Godfrey Pick Courteney Alderete;   cardiac cath 02-18-2013 normal coronaries arteries, ef 50%, cath done since echo  showed ef 30-35%; nucleat stress study 04/ 2020 normal , normal echo 04/ 2021,  event monitor 09-14-2020 rare ST/AT variable block   PONV (postoperative nausea and vomiting)    Protein S deficiency ([place])    followed by hemotology/ oncology-- dr d. Bobby Rumpf ([facility]) dx 1980s;  prior DVT left lower leg 1980s and left lung PE 1993; chronic  coumadin since 1980s   PVC's (premature ventricular contractions)    followed by cardiology   S/P cardiac catheterization 02/2013   Normal coronaries; low normal EF at 50%   Solitary pulmonary nodule on lung CT 02/06/2019   First noted 01/13/2014 > no change as of 12/21/2018   Spondylolisthesis, lumbar region 08/09/2018   Stroke ([place])    TIA in 2018 or 2019   Transient ischemic attack 07/30/2013   Type 2 diabetes mellitus treated with insulin ([place])    endocrilogist--- whitney reardon NP     (03-10-2021  pt continuously checks blood sugar throughout the day w/ Libre, fasting sugar --- 69--200)   Wears glasses    Wears hearing aid in both ears     Past Surgical History:  Procedure Laterality Date   ABDOMINAL HYSTERECTOMY     BLEPHAROPLASTY     CARDIAC CATHETERIZATION  02/18/2013   @ [facility]  by Dr Martinique;  normal coronaries w/ preserved LVF, ef 50%;   previous cath 2001 normal ef 65%   CARPAL TUNNEL RELEASE Bilateral 1994   CATARACT EXTRACTION W/ INTRAOCULAR LENS IMPLANT Bilateral 2017   CHOLECYSTECTOMY, LAPAROSCOPIC  1993   COLONOSCOPY     EAR BIOPSY Left    FINGER SURGERY Left 2018   thumb   FOOT SURGERY     FOOT TENDON SURGERY Right    early 2000s   HAND SURGERY     HARDWARE REMOVAL Left 03/12/2021   Procedure: HARDWARE REMOVAL;  Surgeon: Nicholes Stairs, MD;  Location: [facility];  Service: Orthopedics;  Laterality: Left;  60 MINS   ORIF ANKLE FRACTURE Left 02/26/2020   Procedure: OPEN REDUCTION INTERNAL FIXATION (ORIF) ANKLE FRACTURE;  Surgeon: Nicholes Stairs, MD;  Location: [facility];  Service:  Orthopedics;  Laterality: Left;  90 mins   TONSILLECTOMY AND ADENOIDECTOMY Bilateral    TOTAL KNEE ARTHROPLASTY Left 03/2005   TOTAL VAGINAL HYSTERECTOMY  1988   per pt still has ovaries    Current Medications: Current Meds  Medication Sig   albuterol (VENTOLIN HFA) 108 (90 Base) MCG/ACT inhaler INHALE 1 TO 2 PUFFS INTO THE LUNGS EVERY 6 HOURS AS NEEDED FOR WHEEZING OR SHORTNESS OF BREATH   aspirin EC (ASPIRIN LOW DOSE) 81 MG tablet Take 1 tablet (81 mg total) by mouth daily.  azelastine (ASTELIN) 0.1 % nasal spray Place 2 sprays into both nostrils daily as needed for rhinitis or allergies.   B Complex-C (B-COMPLEX WITH VITAMIN C) tablet Take 1 tablet by mouth daily with lunch.   Calcium Citrate-Vitamin D (CALCIUM CITRATE + PO) Take by mouth.   Cholecalciferol (VITAMIN D3) 50 MCG (2000 UT) TABS Take 4,000 Units by mouth daily with lunch.   clindamycin (CLEOCIN) 300 MG capsule Take 300 mg by mouth every 6 (six) hours.   Continuous Blood Gluc Sensor (FREESTYLE LIBRE 2 SENSOR) MISC 2 each by Does not apply route every 14 (fourteen) days. E11.40   dapagliflozin propanediol (FARXIGA) 10 MG TABS tablet Take 1 tablet (10 mg total) by mouth daily before breakfast.   denosumab (PROLIA) 60 MG/ML SOSY injection 60 mg by sub-q route.   enoxaparin (LOVENOX) 80 MG/0.8ML injection Inject 0.8 mLs (80 mg total) into the skin every 12 (twelve) hours.   estradiol (ESTRACE) 0.1 MG/GM vaginal cream Place 1 Applicatorful vaginally 2 (two) times a week. Uses twice weekly   ferrous sulfate 325 (65 FE) MG tablet Take 325 mg by mouth daily with lunch.   GEMTESA 75 MG TABS Take by mouth.   Insulin Aspart FlexPen (NOVOLOG) 100 UNIT/ML Max daily 30 units   insulin glargine, 1 Unit Dial, (TOUJEO) 300 UNIT/ML Solostar Pen Inject 8 Units into the skin at bedtime.   Insulin Pen Needle (BD PEN NEEDLE NANO 2ND GEN) 32G X 4 MM MISC USE DAILY AS DIRECTED   ipratropium-albuterol (DUONEB) 0.5-2.5 (3) MG/3ML SOLN Take 3 mLs by  nebulization 4 (four) times daily as needed (wheezing/shortness of breath).   magnesium oxide (MAG-OX) 400 (240 Mg) MG tablet TAKE 2 TABLETS(800 MG) BY MOUTH TWICE DAILY (Patient taking differently: Take 800 mg by mouth 2 (two) times daily.)   methocarbamol (ROBAXIN) 750 MG tablet Take 1 tablet by mouth 3 (three) times daily.   Multiple Vitamins-Minerals (PRESERVISION AREDS 2 PO) Take by mouth.   nystatin cream (MYCOSTATIN) APPLY TO AREA OF RED IRRITATED RASH 3 TO 4 TIMES A DAY FOR SKIN YEAST INFECTION   ondansetron (ZOFRAN) 4 MG tablet Take 1 tablet (4 mg total) by mouth every 8 (eight) hours as needed for nausea or vomiting.   ondansetron (ZOFRAN-ODT) 8 MG disintegrating tablet Take 8 mg by mouth every 8 (eight) hours as needed for nausea or vomiting.    oxyCODONE-acetaminophen (PERCOCET/ROXICET) 5-325 MG tablet Take 1 tablet by mouth every 6 (six) hours as needed.   pantoprazole (PROTONIX) 40 MG tablet TAKE 1 TABLET(40 MG) BY MOUTH EVERY MORNING   polyethylene glycol (MIRALAX / GLYCOLAX) 17 g packet Take 17 g by mouth as needed. Uses 1-2 times weekly   traZODone (DESYREL) 50 MG tablet Take 1 tablet (50 mg total) by mouth at bedtime as needed for sleep.   warfarin (COUMADIN) 1 MG tablet Take 1 tablet (1 mg total) by mouth as directed.   warfarin (COUMADIN) 5 MG tablet TAKE 1 TABLET BY MOUTH EVERY DAY   [DISCONTINUED] amLODipine (NORVASC) 5 MG tablet TAKE 1 TABLET(5 MG) BY MOUTH DAILY   [DISCONTINUED] atorvastatin (LIPITOR) 80 MG tablet TAKE 1 TABLET(80 MG) BY MOUTH DAILY   [DISCONTINUED] furosemide (LASIX) 40 MG tablet TAKE 1 TABLET(40 MG) BY MOUTH DAILY   [DISCONTINUED] isosorbide mononitrate (IMDUR) 30 MG 24 hr tablet 1 tablet by mouth daily, 2nd attempt, please reach arrange appt for further refills   [DISCONTINUED] lisinopril (ZESTRIL) 20 MG tablet TAKE 2 TABLETS(40 MG) BY MOUTH TWICE DAILY  Allergies:   Ketek [telithromycin], Loxapine succinate, Naproxen sodium, Amoxapine and related,  Darvon, Belsomra [suvorexant], Cymbalta [duloxetine hcl], Duloxetine, Gabapentin, Lyrica [pregabalin], Amoxicillin, and Propoxyphene   Social History   Socioeconomic History   Marital status: Married    Spouse name: [name]   Number of children: Not on file   Years of education: Not on file   Highest education level: Not on file  Occupational History   Not on file  Tobacco Use   Smoking status: Never   Smokeless tobacco: Never  Vaping Use   Vaping Use: Never used  Substance and Sexual Activity   Alcohol use: No   Drug use: Never   Sexual activity: Not on file  Other Topics Concern   Not on file  Social History Narrative   Lives with spouse in [place].  2 grown daughters.   Retired Glass blower/designer     Social Determinants of [place] Strain: Low Risk  (05/25/2022)   Overall Financial Resource Strain (CARDIA)    Difficulty of Paying Living Expenses: Not hard at all  Food Insecurity: No Food Insecurity (09/30/2020)   Hunger Vital Sign    Worried About Running Out of Food in the Last Year: Never true    [place] in the Last Year: Never true  Transportation Needs: No Transportation Needs (03/23/2022)   PRAPARE - Hydrologist (Medical): No    Lack of Transportation (Non-Medical): No  Physical Activity: Inactive (05/25/2022)   Exercise Vital Sign    Days of Exercise per Week: 0 days    Minutes of Exercise per Session: 0 min  Stress: Not on file  Social Connections: Not on file     Family History: The patient's family history includes Antithrombin III deficiency in her mother; Cirrhosis in her mother; Coronary artery disease in her father; Diabetes in her father and sister; Heart attack in her brother, father, and sister; Hypertension in her brother, father, and sister; Kidney disease in her father; Lung cancer in her brother; Protein S deficiency in her daughter; Ulcerative colitis in her mother. There is no history of Stroke,  Colitis, Colon polyps, Esophageal cancer, Liver cancer, Pancreatic cancer, Rectal cancer, Stomach cancer, or Breast cancer.  ROS:   Review of Systems  Constitution: Negative for decreased appetite, fever and weight gain.  HENT: Negative for congestion, ear discharge, hoarse voice and sore throat.   Eyes: Negative for discharge, redness, vision loss in right eye and visual halos.  Cardiovascular: Negative for chest pain, dyspnea on exertion, leg swelling, orthopnea and palpitations.  Respiratory: Negative for cough, hemoptysis, shortness of breath and snoring.   Endocrine: Negative for heat intolerance and polyphagia.  Hematologic/Lymphatic: Negative for bleeding problem. Does not bruise/bleed easily.  Skin: Negative for flushing, nail changes, rash and suspicious lesions.  Musculoskeletal: Negative for arthritis, joint pain, muscle cramps, myalgias, neck pain and stiffness.  Gastrointestinal: Negative for abdominal pain, bowel incontinence, diarrhea and excessive appetite.  Genitourinary: Negative for decreased libido, genital sores and incomplete emptying.  Neurological: Negative for brief paralysis, focal weakness, headaches and loss of balance.  Psychiatric/Behavioral: Negative for altered mental status, depression and suicidal ideas.  Allergic/Immunologic: Negative for HIV exposure and persistent infections.    EKGs/Labs/Other Studies Reviewed:    The following studies were reviewed today:   EKG: None today   Echocardiogram IMPRESSIONS   1. Left ventricular ejection fraction, by estimation, is 60 to 65%. The left ventricle has normal function. The  left ventricle has no regional wall motion abnormalities. There is moderate concentric left ventricular hypertrophy. Left ventricular  diastolic parameters are consistent with Grade I diastolic dysfunction (impaired relaxation).   2. Right ventricular systolic function is normal. The right ventricular size is normal. There is normal  pulmonary artery systolic pressure.   3. Left atrial size was mildly dilated.   4. The mitral valve is normal in structure. Trivial mitral valve regurgitation. No evidence of mitral stenosis.   5. The aortic valve is tricuspid. Aortic valve regurgitation is not visualized. No aortic stenosis is present.   6. The inferior vena cava is normal in size with greater than 50% respiratory variability, suggesting right atrial pressure of 3 mmHg.   Recent Labs: 10/18/2021: Magnesium 1.7 05/02/2022: TSH 1.480 06/06/2022: ALT 41; B Natriuretic Peptide 76.4; BUN 25; Creatinine, Ser 1.14; Hemoglobin 12.1; Platelets 307; Potassium 3.5; Sodium 132  Recent Lipid Panel    Component Value Date/Time   CHOL 143 05/02/2022 1013   TRIG 86 05/02/2022 1013   HDL 70 05/02/2022 1013   CHOLHDL 2.0 05/02/2022 1013   LDLCALC 57 05/02/2022 1013    Physical Exam:    VS:  BP 132/68   Pulse 74   Ht 5' (1.524 m)   Wt 158 lb 9.6 oz (71.9 kg)   SpO2 96%   BMI 30.97 kg/m     Wt Readings from Last 3 Encounters:  07/15/22 158 lb 9.6 oz (71.9 kg)  06/09/22 168 lb (76.2 kg)  06/06/22 166 lb (75.3 kg)     GEN: Well nourished, well developed in no acute distress HEENT: Normal NECK: No JVD; No carotid bruits LYMPHATICS: No lymphadenopathy CARDIAC: S1S2 noted,RRR, no murmurs, rubs, gallops RESPIRATORY:  Clear to auscultation without rales, wheezing or rhonchi  ABDOMEN: Soft, non-tender, non-distended, +bowel sounds, no guarding. EXTREMITIES: No edema, No cyanosis, no clubbing MUSCULOSKELETAL:  No deformity  SKIN: Warm and dry NEUROLOGIC:  Alert and oriented x 3, non-focal PSYCHIATRIC:  Normal affect, good insight  ASSESSMENT:    1. Cramps of left lower extremity   2. Essential hypertension   3. Primary hypertension     PLAN:    She is experiencing leg cramping.  We will place an ultrasound Doppler studies.  No medication changes at this time.  refills will be sent.  The patient is in agreement with  the above plan. The patient left the office in stable condition.  The patient will follow up in 6 months or sooner if needed.   Medication Adjustments/Labs and Tests Ordered: Current medicines are reviewed at length with the patient today.  Concerns regarding medicines are outlined above.  Orders Placed This Encounter  Procedures   VAS [place] ABI WITH/WO TBI   VAS [place] LOWER EXTREMITY ARTERIAL DUPLEX    Meds ordered this encounter  Medications   furosemide (LASIX) 40 MG tablet    Sig: Take 1 tablet (40 mg total) by mouth daily.    Dispense:  90 tablet    Refill:  3    Patient needs to schedule and office visit for further refills.   isosorbide mononitrate (IMDUR) 30 MG 24 hr tablet    Sig: 1 tablet by mouth daily, 2nd attempt, please reach arrange appt for further refills    Dispense:  90 tablet    Refill:  3    **Patient requests 90 days supply**   atorvastatin (LIPITOR) 80 MG tablet    Sig: TAKE 1 TABLET(80 MG) BY MOUTH DAILY  Dispense:  90 tablet    Refill:  3   amLODipine (NORVASC) 5 MG tablet    Sig: TAKE 1 TABLET(5 MG) BY MOUTH DAILY    Dispense:  90 tablet    Refill:  3   lisinopril (ZESTRIL) 20 MG tablet    Sig: TAKE 2 TABLETS(40 MG) BY MOUTH TWICE DAILY    Dispense:  180 tablet    Refill:  3    Patient Instructions  Medication Instructions:  Your physician recommends that you continue on your current medications as directed. Please refer to the Current Medication list given to you today.  Medication Refilled as reqeusted. *If you need a refill on your cardiac medications before your next appointment, please call your pharmacy*   Lab Work: none If you have labs (blood work) drawn today and your tests are completely normal, you will receive your results only by: [place] (if you have MyChart) OR A paper copy in the mail If you have any lab test that is abnormal or we need to change your treatment, we will call you to review the  results.   Testing/Procedures: To be completed in [place] has requested that you have an ankle brachial index (ABI). During this test an ultrasound and blood pressure cuff are used to evaluate the arteries that supply the arms and legs with blood. Allow thirty minutes for this exam. There are no restrictions or special instructions.  Your physician has requested that you have a lower extremity arterial doppler- During this test, ultrasound is used to evaluate arterial blood flow in the legs. Allow approximately one hour for this exam.    Follow-Up: At [facility], you and your health needs are our priority.  As part of our continuing mission to provide you with exceptional heart care, we have created designated Provider Care Teams.  These Care Teams include your primary Cardiologist (physician) and Advanced Practice Providers (APPs -  Physician Assistants and Nurse Practitioners) who all work together to provide you with the care you need, when you need it.  We recommend signing up for the patient portal called "MyChart".  Sign up information is provided on this After Visit Summary.  MyChart is used to connect with patients for Virtual Visits (Telemedicine).  Patients are able to view lab/test results, encounter notes, upcoming appointments, etc.  Non-urgent messages can be sent to your provider as well.   To learn more about what you can do with MyChart, go to [email or web address].    Your next appointment:   12 year(s)  The format for your next appointment:   In Person  Provider:   Berniece Salines, DO     Other Instructions none  Important Information About Sugar         Adopting a Healthy Lifestyle.  Know what a healthy weight is for you (roughly BMI <25) and aim to maintain this   Aim for 7+ servings of fruits and vegetables daily   65-80+ fluid ounces of water or unsweet tea for healthy kidneys   Limit to max 1 drink of alcohol per day; avoid  smoking/tobacco   Limit animal fats in diet for cholesterol and heart health - choose grass fed whenever available   Avoid highly processed foods, and foods high in saturated/trans fats   Aim for low stress - take time to unwind and care for your mental health   Aim for 150 min of moderate intensity exercise weekly for heart health, and weights twice  weekly for bone health   Aim for 7-9 hours of sleep daily   When it comes to diets, agreement about the perfect plan isnt easy to find, even among the experts. Experts at the [place] developed an idea known as the Healthy Eating Plate. Just imagine a plate divided into logical, healthy portions.   The emphasis is on diet quality:   Load up on vegetables and fruits - one-half of your plate: Aim for color and variety, and remember that potatoes dont count.   Go for whole grains - one-quarter of your plate: Whole wheat, barley, wheat berries, quinoa, oats, brown rice, and foods made with them. If you want pasta, go with whole wheat pasta.   Protein power - one-quarter of your plate: Fish, chicken, beans, and nuts are all healthy, versatile protein sources. Limit red meat.   The diet, however, does go beyond the plate, offering a few other suggestions.   Use healthy plant oils, such as olive, canola, soy, corn, sunflower and peanut. Check the labels, and avoid partially hydrogenated oil, which have unhealthy trans fats.   If youre thirsty, drink water. Coffee and tea are good in moderation, but skip sugary drinks and limit milk and dairy products to one or two daily servings.   The type of carbohydrate in the diet is more important than the amount. Some sources of carbohydrates, such as vegetables, fruits, whole grains, and beans-are healthier than others.   Finally, stay active  Signed, Berniece Salines, DO  07/18/2022 2:50 PM    [facility]

## 2022-07-18 ENCOUNTER — Telehealth: Payer: Self-pay

## 2022-07-18 LAB — PROTIME-INR

## 2022-07-18 NOTE — Telephone Encounter (Addendum)
Dr Bobby Rumpf notified of below. He recommends pt increase her warfarin to '6mg'$  po qd. Retest INR next Monday.   Pt was off Warfarin for dental procedure on the 9th, & was bridged w/Lovenox.  She takes Warfarin '5mg'$  daily, except on Tues and Friday, which she takes warfarin '6mg'$ . She was told to continue Lovenox until INR is therapeutic.  Her INR this morning is 1.4. Please advise.

## 2022-07-18 NOTE — Telephone Encounter (Signed)
[employer] data, sugars elevated. Will let PCP know

## 2022-07-19 ENCOUNTER — Encounter: Payer: Self-pay | Admitting: Oncology

## 2022-07-20 NOTE — Telephone Encounter (Signed)
Patient has been advised and will increase the medication

## 2022-07-20 NOTE — Telephone Encounter (Signed)
LMTCB

## 2022-07-21 DIAGNOSIS — E1122 Type 2 diabetes mellitus with diabetic chronic kidney disease: Secondary | ICD-10-CM | POA: Diagnosis not present

## 2022-07-21 DIAGNOSIS — E1165 Type 2 diabetes mellitus with hyperglycemia: Secondary | ICD-10-CM | POA: Diagnosis not present

## 2022-07-25 ENCOUNTER — Telehealth: Payer: Self-pay

## 2022-07-25 NOTE — Telephone Encounter (Addendum)
Pt LVM on nurse line, that her INR was 3.0 She was excited that it was finally therapeutic. I will talk to Dr Bobby Rumpf and get his recommendations.  Dr Bobby Rumpf notified of INR results He states pt can stop Lovenox injections and continue warfarin @ current dose (warfarin '6mg'$  po qd).  Pt notified of Dr Bobby Rumpf' recommendations and verbalized understanding.

## 2022-07-26 ENCOUNTER — Telehealth: Payer: Self-pay

## 2022-07-26 NOTE — Telephone Encounter (Signed)
Patient states that every since she has increased the Novolog to 5 units with each meal, her sugars have been in the 60 for the last 5 days.  Would like to know if she needs to decrease back to 3 units .

## 2022-07-26 NOTE — Telephone Encounter (Signed)
Patient has been advised and will make adjustment

## 2022-08-01 ENCOUNTER — Encounter: Payer: Self-pay | Admitting: Oncology

## 2022-08-01 ENCOUNTER — Other Ambulatory Visit: Payer: Self-pay | Admitting: Cardiology

## 2022-08-01 LAB — PROTIME-INR

## 2022-08-02 ENCOUNTER — Ambulatory Visit (INDEPENDENT_AMBULATORY_CARE_PROVIDER_SITE_OTHER): Payer: Medicare PPO | Admitting: Legal Medicine

## 2022-08-02 ENCOUNTER — Telehealth: Payer: Self-pay

## 2022-08-02 ENCOUNTER — Encounter: Payer: Self-pay | Admitting: Legal Medicine

## 2022-08-02 VITALS — BP 140/60 | HR 68 | Temp 97.4°F | Resp 14 | Ht 60.0 in | Wt 159.0 lb

## 2022-08-02 DIAGNOSIS — R1030 Lower abdominal pain, unspecified: Secondary | ICD-10-CM | POA: Diagnosis not present

## 2022-08-02 DIAGNOSIS — I700 Atherosclerosis of aorta: Secondary | ICD-10-CM | POA: Diagnosis not present

## 2022-08-02 DIAGNOSIS — R109 Unspecified abdominal pain: Secondary | ICD-10-CM | POA: Diagnosis not present

## 2022-08-02 LAB — POCT URINALYSIS DIP (CLINITEK)
Bilirubin, UA: NEGATIVE
Blood, UA: NEGATIVE
Glucose, UA: >=1000 mg/dL — AB
Ketones, POC UA: NEGATIVE mg/dL
Leukocytes, UA: NEGATIVE
Nitrite, UA: NEGATIVE
POC PROTEIN,UA: NEGATIVE
Spec Grav, UA: 1.01 (ref 1.010–1.025)
Urobilinogen, UA: 0.2 E.U./dL
pH, UA: 5.5 (ref 5.0–8.0)

## 2022-08-02 MED ORDER — CEPHALEXIN 500 MG PO CAPS: 500.0000 mg | ORAL_CAPSULE | Freq: Three times a day (TID) | ORAL | 0 refills | Status: DC

## 2022-08-02 NOTE — Telephone Encounter (Addendum)
Pamela Mosher,PA:  RE: INR 3.6 this morning Received: Today [name], Beryle Flock, RN Phone Number: [phone]   I see now that it was from 10/2, not sure why just getting queued to me now. I tried to call but she did not answer either phone. I would recommend she decrease her coumadin to 5 mg Tue, Thur, Sat and continue 6 mg the rest of days, repeat in 1 week, if you would let her know. Thanks    @ 1034 -I LVM on identified answering machine req pt to call me back.

## 2022-08-02 NOTE — Progress Notes (Signed)
Acute Office Visit  Subjective:    Patient ID: Pamela [name], female    DOB: Apr 08, 1946, 76 y.o.   MRN: [id number]  Chief Complaint  Patient presents with   Urinary Tract Infection    HPI: Patient is in today for RLQ pain since 3 days ago with headache, chills and she feels is similar when she had an infection on her bladder and kidneys. Having chills, no fever.  Recently treat for right pyeloneohritis. CT abdomen , indistinct right kidney. Pain with rebound RLQ.  Past Medical History:  Diagnosis Date   Anticoagulated on Coumadin    chronic--- managed by hematology/ oncology,   Asthma, mild intermittent    followed by pcp--- per pt last exacerbation winter 2021 w/ acute bronchitis   Bilateral leg cramps    Chronic constipation    CKD (chronic kidney disease), stage III ([place])    Closed bimalleolar fracture of left ankle 02/26/2020   DDD (degenerative disc disease), cervical    w/ spondylosis,  per pt last steroid injection 06/ 2022   DDD (degenerative disc disease), lumbosacral    DVT (deep venous thrombosis) ([place]) 07/30/2013   Dyspnea    occasionally   GERD (gastroesophageal reflux disease)    History of cardiac murmur as a child    History of DVT of lower extremity    left lower extremity in 1980s, fell when bowling   History of pulmonary embolus (PE) 1993   per pt left lung post op 2 wks cholecystectomy   History of rheumatic fever as a child    per last echo 12-31-2019 no valvular issues   History of TIA (transient ischemic attack)    2014 and 2018 or 2019,  per pt no residual   HTN (hypertension)    followed by pcp   Hyperlipidemia    IDA (iron deficiency anemia)    Macular degeneration of both eyes    Mild obstructive sleep apnea    per pt dx 2017 tried to uses cpap but intolerant   Mixed incontinence urge and stress    urologist--- dr Nila Nephew   OA (osteoarthritis) 07/06/2018   Osteoporosis    PAF (paroxysmal atrial fibrillation) ([place]) 10/01/2014    cardiologist--- dr Godfrey Pick tobb;   cardiac cath 02-18-2013 normal coronaries arteries, ef 50%, cath done since echo showed ef 30-35%; nucleat stress study 04/ 2020 normal , normal echo 04/ 2021,  event monitor 09-14-2020 rare ST/AT variable block   PONV (postoperative nausea and vomiting)    Protein S deficiency ([place])    followed by hemotology/ oncology-- dr d. Bobby Rumpf ([facility]) dx 1980s;  prior DVT left lower leg 1980s and left lung PE 1993; chronic  coumadin since 1980s   PVC's (premature ventricular contractions)    followed by cardiology   S/P cardiac catheterization 02/2013   Normal coronaries; low normal EF at 50%   Solitary pulmonary nodule on lung CT 02/06/2019   First noted 01/13/2014 > no change as of 12/21/2018   Spondylolisthesis, lumbar region 08/09/2018   Stroke ([place])    TIA in 2018 or 2019   Transient ischemic attack 07/30/2013   Type 2 diabetes mellitus treated with insulin ([place])    endocrilogist--- whitney reardon NP     (03-10-2021  pt continuously checks blood sugar throughout the day w/ Libre, fasting sugar --- 69--200)   Wears glasses    Wears hearing aid in both ears     Past Surgical History:  Procedure Laterality Date  ABDOMINAL HYSTERECTOMY     BLEPHAROPLASTY     CARDIAC CATHETERIZATION  02/18/2013   @ [facility]  by Dr Martinique;  normal coronaries w/ preserved LVF, ef 50%;   previous cath 2001 normal ef 65%   CARPAL TUNNEL RELEASE Bilateral 1994   CATARACT EXTRACTION W/ INTRAOCULAR LENS IMPLANT Bilateral 2017   CHOLECYSTECTOMY, LAPAROSCOPIC  1993   COLONOSCOPY     EAR BIOPSY Left    FINGER SURGERY Left 2018   thumb   FOOT SURGERY     FOOT TENDON SURGERY Right    early 2000s   HAND SURGERY Right 04/2022   HARDWARE REMOVAL Left 03/12/2021   Procedure: HARDWARE REMOVAL;  Surgeon: Nicholes Stairs, MD;  Location: [facility];  Service: Orthopedics;  Laterality: Left;  60 MINS   ORIF ANKLE FRACTURE Left 02/26/2020   Procedure:  OPEN REDUCTION INTERNAL FIXATION (ORIF) ANKLE FRACTURE;  Surgeon: Nicholes Stairs, MD;  Location: [facility];  Service: Orthopedics;  Laterality: Left;  90 mins   TONSILLECTOMY AND ADENOIDECTOMY Bilateral    TOTAL KNEE ARTHROPLASTY Left 03/2005   TOTAL VAGINAL HYSTERECTOMY  1988   per pt still has ovaries    Family History  Problem Relation Age of Onset   Cirrhosis Mother    Antithrombin III deficiency Mother        multiple emboli   Ulcerative colitis Mother    Diabetes Father    Coronary artery disease Father    Heart attack Father    Hypertension Father    Kidney disease Father    Hypertension Sister    Diabetes Sister    Heart attack Sister        age 46    Protein S deficiency Daughter    Heart attack Brother    Hypertension Brother    Lung cancer Brother    Stroke Neg Hx    Colitis Neg Hx    Colon polyps Neg Hx    Esophageal cancer Neg Hx    Liver cancer Neg Hx    Pancreatic cancer Neg Hx    Rectal cancer Neg Hx    Stomach cancer Neg Hx    Breast cancer Neg Hx     Social History   Socioeconomic History   Marital status: Married    Spouse name: [name]   Number of children: Not on file   Years of education: Not on file   Highest education level: Not on file  Occupational History   Not on file  Tobacco Use   Smoking status: Never   Smokeless tobacco: Never  Vaping Use   Vaping Use: Never used  Substance and Sexual Activity   Alcohol use: No   Drug use: Never   Sexual activity: Not on file  Other Topics Concern   Not on file  Social History Narrative   Lives with spouse in [place].  2 grown daughters.   Retired Glass blower/designer     Social Determinants of [place] Strain: Low Risk  (05/25/2022)   Overall Financial Resource Strain (CARDIA)    Difficulty of Paying Living Expenses: Not hard at all  Food Insecurity: No Food Insecurity (09/30/2020)   Hunger Vital Sign    Worried About Running Out of Food in the Last Year: Never true     [place] in the Last Year: Never true  Transportation Needs: No Transportation Needs (03/23/2022)   PRAPARE - Hydrologist (Medical): No  Lack of Transportation (Non-Medical): No  Physical Activity: Inactive (05/25/2022)   Exercise Vital Sign    Days of Exercise per Week: 0 days    Minutes of Exercise per Session: 0 min  Stress: Not on file  Social Connections: Not on file  Intimate Partner Violence: Not on file    Outpatient Medications Prior to Visit  Medication Sig Dispense Refill   albuterol (VENTOLIN HFA) 108 (90 Base) MCG/ACT inhaler INHALE 1 TO 2 PUFFS INTO THE LUNGS EVERY 6 HOURS AS NEEDED FOR WHEEZING OR SHORTNESS OF BREATH 6.7 g 3   amLODipine (NORVASC) 5 MG tablet TAKE 1 TABLET(5 MG) BY MOUTH DAILY 90 tablet 3   atorvastatin (LIPITOR) 80 MG tablet TAKE 1 TABLET(80 MG) BY MOUTH DAILY 90 tablet 3   azelastine (ASTELIN) 0.1 % nasal spray Place 2 sprays into both nostrils daily as needed for rhinitis or allergies. 30 mL 6   B Complex-C (B-COMPLEX WITH VITAMIN C) tablet Take 1 tablet by mouth daily with lunch.     Calcium Citrate-Vitamin D (CALCIUM CITRATE + PO) Take by mouth.     Cholecalciferol (VITAMIN D3) 50 MCG (2000 UT) TABS Take 4,000 Units by mouth daily with lunch.     Continuous Blood Gluc Sensor (FREESTYLE LIBRE 2 SENSOR) MISC 2 each by Does not apply route every 14 (fourteen) days. E11.40 6 each 3   dapagliflozin propanediol (FARXIGA) 10 MG TABS tablet Take 1 tablet (10 mg total) by mouth daily before breakfast. 90 tablet 3   denosumab (PROLIA) 60 MG/ML SOSY injection 60 mg by sub-q route.     estradiol (ESTRACE) 0.1 MG/GM vaginal cream Place 1 Applicatorful vaginally 2 (two) times a week. Uses twice weekly     ferrous sulfate 325 (65 FE) MG tablet Take 325 mg by mouth daily with lunch.     furosemide (LASIX) 40 MG tablet Take 1 tablet (40 mg total) by mouth daily. 90 tablet 3   GEMTESA 75 MG TABS Take by mouth.     Insulin Aspart  FlexPen (NOVOLOG) 100 UNIT/ML Max daily 30 units 15 mL 6   insulin glargine, 1 Unit Dial, (TOUJEO) 300 UNIT/ML Solostar Pen Inject 8 Units into the skin at bedtime. 15 mL 3   Insulin Pen Needle (BD PEN NEEDLE NANO 2ND GEN) 32G X 4 MM MISC USE DAILY AS DIRECTED 200 each 3   ipratropium-albuterol (DUONEB) 0.5-2.5 (3) MG/3ML SOLN Take 3 mLs by nebulization 4 (four) times daily as needed (wheezing/shortness of breath).  0   isosorbide mononitrate (IMDUR) 30 MG 24 hr tablet 1 tablet by mouth daily, 2nd attempt, please reach arrange appt for further refills 90 tablet 3   lisinopril (ZESTRIL) 20 MG tablet TAKE 2 TABLETS(40 MG) BY MOUTH TWICE DAILY 180 tablet 3   magnesium oxide (MAG-OX) 400 (240 Mg) MG tablet TAKE 2 TABLETS(800 MG) BY MOUTH TWICE DAILY (Patient taking differently: Take 800 mg by mouth 2 (two) times daily.) 120 tablet 2   methocarbamol (ROBAXIN) 750 MG tablet Take 1 tablet by mouth 3 (three) times daily.     Multiple Vitamins-Minerals (PRESERVISION AREDS 2 PO) Take by mouth.     nystatin cream (MYCOSTATIN) APPLY TO AREA OF RED IRRITATED RASH 3 TO 4 TIMES A DAY FOR SKIN YEAST INFECTION     ondansetron (ZOFRAN) 4 MG tablet Take 1 tablet (4 mg total) by mouth every 8 (eight) hours as needed for nausea or vomiting. 15 tablet 0   ondansetron (ZOFRAN-ODT) 8 MG disintegrating tablet Take  8 mg by mouth every 8 (eight) hours as needed for nausea or vomiting.      oxyCODONE-acetaminophen (PERCOCET/ROXICET) 5-325 MG tablet Take 1 tablet by mouth every 6 (six) hours as needed.     pantoprazole (PROTONIX) 40 MG tablet TAKE 1 TABLET(40 MG) BY MOUTH EVERY MORNING 90 tablet 3   polyethylene glycol (MIRALAX / GLYCOLAX) 17 g packet Take 17 g by mouth as needed. Uses 1-2 times weekly     traZODone (DESYREL) 50 MG tablet Take 1 tablet (50 mg total) by mouth at bedtime as needed for sleep. 90 tablet 0   warfarin (COUMADIN) 1 MG tablet Take 1 tablet (1 mg total) by mouth as directed. 120 tablet 2   warfarin  (COUMADIN) 5 MG tablet TAKE 1 TABLET BY MOUTH EVERY DAY 90 tablet 1   aspirin EC (ASPIRIN LOW DOSE) 81 MG tablet Take 1 tablet (81 mg total) by mouth daily. 90 tablet 0   clindamycin (CLEOCIN) 300 MG capsule Take 300 mg by mouth every 6 (six) hours. (Patient not taking: Reported on 08/02/2022)     enoxaparin (LOVENOX) 80 MG/0.8ML injection Inject 0.8 mLs (80 mg total) into the skin every 12 (twelve) hours. (Patient not taking: Reported on 08/02/2022) 20 mL 0   No facility-administered medications prior to visit.    Allergies  Allergen Reactions   Ketek [Telithromycin] Nausea And Vomiting and Rash   Loxapine Succinate Hives   Naproxen Sodium Anaphylaxis and Hives   Amoxapine And Related Hives   Darvon Nausea And Vomiting   Belsomra [Suvorexant] Other (See Comments)    "Nightmares"   Cymbalta [Duloxetine Hcl] Other (See Comments)    "Blackouts"   Duloxetine    Gabapentin Other (See Comments)    Blurry vision   Lyrica [Pregabalin]     Makes her sedated   Amoxicillin Rash   Propoxyphene Nausea And Vomiting    Review of Systems  Constitutional:  Positive for chills and fatigue. Negative for fever.  HENT:  Negative for congestion.   Respiratory:  Positive for shortness of breath. Negative for cough and chest tightness.   Gastrointestinal:  Positive for abdominal pain (RLQ pain).  Genitourinary: Negative.        Objective:    Physical Exam Vitals reviewed.  Constitutional:      Appearance: Normal appearance. She is ill-appearing.  HENT:     Head: Normocephalic.     Right Ear: Tympanic membrane normal.     Left Ear: Tympanic membrane normal.     Nose: Nose normal.     Mouth/Throat:     Mouth: Mucous membranes are moist.     Pharynx: Oropharynx is clear.  Eyes:     Extraocular Movements: Extraocular movements intact.     Conjunctiva/sclera: Conjunctivae normal.     Pupils: Pupils are equal, round, and reactive to light.  Cardiovascular:     Rate and Rhythm: Normal rate  and regular rhythm.     Pulses: Normal pulses.     Heart sounds: Normal heart sounds. No murmur heard.    No gallop.  Pulmonary:     Effort: Pulmonary effort is normal. No respiratory distress.     Breath sounds: Normal breath sounds. No wheezing.  Abdominal:     Tenderness: There is abdominal tenderness.  Musculoskeletal:     Cervical back: Normal range of motion and neck supple.  Neurological:     Mental Status: She is alert.     BP (!) 160/66 (BP Location: Left Arm, Patient  Position: Sitting)   Pulse 68   Temp (!) 97.4 F (36.3 C)   Resp 14   Ht 5' (1.524 m)   Wt 159 lb (72.1 kg)   SpO2 96%   BMI 31.05 kg/m  Wt Readings from Last 3 Encounters:  08/02/22 159 lb (72.1 kg)  07/15/22 158 lb 9.6 oz (71.9 kg)  06/09/22 168 lb (76.2 kg)    Health Maintenance Due  Topic Date Due   Hepatitis C Screening  Never done   Zoster Vaccines- Shingrix (1 of 2) Never done   COVID-19 Vaccine (6 - 2023-24 season) 05/06/2022    There are no preventive care reminders to display for this patient.   Lab Results  Component Value Date   TSH 1.480 05/02/2022   Lab Results  Component Value Date   WBC 11.5 (H) 06/06/2022   HGB 12.1 06/06/2022   HCT 35.6 (L) 06/06/2022   MCV 87.5 06/06/2022   PLT 307 06/06/2022   Lab Results  Component Value Date   NA 132 (L) 06/06/2022   K 3.5 06/06/2022   CO2 29 06/06/2022   GLUCOSE 204 (H) 06/06/2022   BUN 25 (H) 06/06/2022   CREATININE 1.14 (H) 06/06/2022   BILITOT 0.6 06/06/2022   ALKPHOS 76 06/06/2022   AST 31 06/06/2022   ALT 41 06/06/2022   PROT 6.3 (L) 06/06/2022   ALBUMIN 2.7 (L) 06/06/2022   CALCIUM 8.1 (L) 06/06/2022   ANIONGAP 8 06/06/2022   EGFR 52 (L) 05/02/2022   GFR 57.98 (L) 03/10/2020   Lab Results  Component Value Date   CHOL 143 05/02/2022   Lab Results  Component Value Date   HDL 70 05/02/2022   Lab Results  Component Value Date   LDLCALC 57 05/02/2022   Lab Results  Component Value Date   TRIG 86  05/02/2022   Lab Results  Component Value Date   CHOLHDL 2.0 05/02/2022   Lab Results  Component Value Date   HGBA1C 7.6 (A) 05/27/2022       Assessment & Plan:   Problem List Items Addressed This Visit   None Visit Diagnoses     Lower abdominal pain    -  Primary   Relevant Medications   cephALEXin (KEFLEX) 500 MG capsule   Other Relevant Orders   POCT URINALYSIS DIP (CLINITEK) (Completed)   CT Abdomen Pelvis W Contrast   Urine Culture   Comprehensive metabolic panel   CBC with Differential/Platelet   D-dimer, quantitative Patient has severe right lower quadrant pain with some rebound she does not know if she had an appendectomy in the past she had pyelonephritis several months ago and felt similar to that she had her urine was clear.  Also considered potential blood clots although there is no symptoms for this present time.    CT with contrast performed which showed some improvement in the right renal pelvis and parenchymal outline no other acute abnormalities were noted.  So at present we will culture her urine and start her on Keflex 500 mg 3 times a day with definite follow-up in 1 week.      Meds ordered this encounter  Medications   cephALEXin (KEFLEX) 500 MG capsule    Sig: Take 1 capsule (500 mg total) by mouth 3 (three) times daily.    Dispense:  30 capsule    Refill:  0    Orders Placed This Encounter  Procedures   Urine Culture   CT Abdomen Pelvis W Contrast  Comprehensive metabolic panel   CBC with Differential/Platelet   D-dimer, quantitative   POCT URINALYSIS DIP (CLINITEK)     Follow-up: Return in about 1 week (around 08/09/2022).  An After Visit Summary was printed and given to the patient.  Reinaldo Meeker, MD [facility] [phone]

## 2022-08-03 LAB — CBC WITH DIFFERENTIAL/PLATELET
Basophils Absolute: 0 10*3/uL (ref 0.0–0.2)
Basos: 0 %
EOS (ABSOLUTE): 0.1 10*3/uL (ref 0.0–0.4)
Eos: 1 %
Hematocrit: 36.8 % (ref 34.0–46.6)
Hemoglobin: 12.4 g/dL (ref 11.1–15.9)
Immature Grans (Abs): 0 10*3/uL (ref 0.0–0.1)
Immature Granulocytes: 0 %
Lymphocytes Absolute: 1 10*3/uL (ref 0.7–3.1)
Lymphs: 10 %
MCH: 30.2 pg (ref 26.6–33.0)
MCHC: 33.7 g/dL (ref 31.5–35.7)
MCV: 90 fL (ref 79–97)
Monocytes Absolute: 0.7 10*3/uL (ref 0.1–0.9)
Monocytes: 7 %
Neutrophils Absolute: 7.9 10*3/uL — ABNORMAL HIGH (ref 1.4–7.0)
Neutrophils: 82 %
Platelets: 319 10*3/uL (ref 150–450)
RBC: 4.1 x10E6/uL (ref 3.77–5.28)
RDW: 12.7 % (ref 11.7–15.4)
WBC: 9.7 10*3/uL (ref 3.4–10.8)

## 2022-08-03 LAB — COMPREHENSIVE METABOLIC PANEL
ALT: 24 IU/L (ref 0–32)
AST: 23 IU/L (ref 0–40)
Albumin/Globulin Ratio: 1.3 (ref 1.2–2.2)
Albumin: 3.9 g/dL (ref 3.8–4.8)
Alkaline Phosphatase: 98 IU/L (ref 44–121)
BUN/Creatinine Ratio: 20 (ref 12–28)
BUN: 27 mg/dL (ref 8–27)
Bilirubin Total: 0.5 mg/dL (ref 0.0–1.2)
CO2: 24 mmol/L (ref 20–29)
Calcium: 9.7 mg/dL (ref 8.7–10.3)
Chloride: 93 mmol/L — ABNORMAL LOW (ref 96–106)
Creatinine, Ser: 1.34 mg/dL — ABNORMAL HIGH (ref 0.57–1.00)
Globulin, Total: 2.9 g/dL (ref 1.5–4.5)
Glucose: 259 mg/dL — ABNORMAL HIGH (ref 70–99)
Potassium: 5 mmol/L (ref 3.5–5.2)
Sodium: 134 mmol/L (ref 134–144)
Total Protein: 6.8 g/dL (ref 6.0–8.5)
eGFR: 41 mL/min/{1.73_m2} — ABNORMAL LOW (ref >59)

## 2022-08-03 LAB — URINE CULTURE: Organism ID, Bacteria: NO GROWTH

## 2022-08-03 LAB — D-DIMER, QUANTITATIVE: D-DIMER: 1.2 mg/L FEU — ABNORMAL HIGH (ref 0.00–0.49)

## 2022-08-03 NOTE — Progress Notes (Signed)
Glucose 259, kidneys stage 3b, liver tests normal, CBC normal, D-dimer 1.20 high, may need arteriogram abdomen,  lp

## 2022-08-04 ENCOUNTER — Emergency Department (HOSPITAL_COMMUNITY)
Admission: EM | Admit: 2022-08-04 | Discharge: 2022-08-05 | Disposition: A | Discharge status: Home / Self Care | Payer: Medicare PPO | Attending: Emergency Medicine | Admitting: Emergency Medicine

## 2022-08-04 ENCOUNTER — Encounter: Payer: Self-pay | Admitting: Family Medicine

## 2022-08-04 ENCOUNTER — Ambulatory Visit (INDEPENDENT_AMBULATORY_CARE_PROVIDER_SITE_OTHER): Payer: Medicare PPO | Admitting: Family Medicine

## 2022-08-04 ENCOUNTER — Emergency Department (HOSPITAL_COMMUNITY): Payer: Medicare PPO

## 2022-08-04 ENCOUNTER — Other Ambulatory Visit: Payer: Self-pay

## 2022-08-04 VITALS — BP 128/64 | HR 84 | Temp 97.3°F | Resp 16 | Ht 60.0 in | Wt 162.6 lb

## 2022-08-04 DIAGNOSIS — R1084 Generalized abdominal pain: Secondary | ICD-10-CM | POA: Diagnosis not present

## 2022-08-04 DIAGNOSIS — R1031 Right lower quadrant pain: Secondary | ICD-10-CM | POA: Insufficient documentation

## 2022-08-04 DIAGNOSIS — I959 Hypotension, unspecified: Secondary | ICD-10-CM | POA: Diagnosis not present

## 2022-08-04 DIAGNOSIS — R1032 Left lower quadrant pain: Secondary | ICD-10-CM | POA: Insufficient documentation

## 2022-08-04 DIAGNOSIS — I491 Atrial premature depolarization: Secondary | ICD-10-CM | POA: Diagnosis not present

## 2022-08-04 DIAGNOSIS — Z794 Long term (current) use of insulin: Secondary | ICD-10-CM | POA: Diagnosis not present

## 2022-08-04 DIAGNOSIS — I10 Essential (primary) hypertension: Secondary | ICD-10-CM | POA: Diagnosis not present

## 2022-08-04 DIAGNOSIS — I480 Paroxysmal atrial fibrillation: Secondary | ICD-10-CM

## 2022-08-04 DIAGNOSIS — Z7984 Long term (current) use of oral hypoglycemic drugs: Secondary | ICD-10-CM | POA: Diagnosis not present

## 2022-08-04 DIAGNOSIS — R1030 Lower abdominal pain, unspecified: Secondary | ICD-10-CM

## 2022-08-04 DIAGNOSIS — R100 Acute abdomen: Secondary | ICD-10-CM | POA: Insufficient documentation

## 2022-08-04 DIAGNOSIS — Z7982 Long term (current) use of aspirin: Secondary | ICD-10-CM | POA: Diagnosis not present

## 2022-08-04 DIAGNOSIS — I700 Atherosclerosis of aorta: Secondary | ICD-10-CM | POA: Diagnosis not present

## 2022-08-04 DIAGNOSIS — I4891 Unspecified atrial fibrillation: Secondary | ICD-10-CM | POA: Diagnosis not present

## 2022-08-04 DIAGNOSIS — R0602 Shortness of breath: Secondary | ICD-10-CM | POA: Diagnosis not present

## 2022-08-04 DIAGNOSIS — Z7901 Long term (current) use of anticoagulants: Secondary | ICD-10-CM | POA: Insufficient documentation

## 2022-08-04 LAB — CBC
HCT: 39 % (ref 36.0–46.0)
Hemoglobin: 13 g/dL (ref 12.0–15.0)
MCH: 30.1 pg (ref 26.0–34.0)
MCHC: 33.3 g/dL (ref 30.0–36.0)
MCV: 90.3 fL (ref 80.0–100.0)
Platelets: 351 10*3/uL (ref 150–400)
RBC: 4.32 MIL/uL (ref 3.87–5.11)
RDW: 14.1 % (ref 11.5–15.5)
WBC: 7.3 10*3/uL (ref 4.0–10.5)
nRBC: 0 % (ref 0.0–0.2)

## 2022-08-04 LAB — COMPREHENSIVE METABOLIC PANEL
ALT: 25 U/L (ref 0–44)
AST: 29 U/L (ref 15–41)
Albumin: 3.8 g/dL (ref 3.5–5.0)
Alkaline Phosphatase: 84 U/L (ref 38–126)
Anion gap: 14 (ref 5–15)
BUN: 34 mg/dL — ABNORMAL HIGH (ref 8–23)
CO2: 24 mmol/L (ref 22–32)
Calcium: 9.7 mg/dL (ref 8.9–10.3)
Chloride: 96 mmol/L — ABNORMAL LOW (ref 98–111)
Creatinine, Ser: 1.44 mg/dL — ABNORMAL HIGH (ref 0.44–1.00)
GFR, Estimated: 37 mL/min — ABNORMAL LOW (ref >60)
Glucose, Bld: 168 mg/dL — ABNORMAL HIGH (ref 70–99)
Potassium: 4 mmol/L (ref 3.5–5.1)
Sodium: 134 mmol/L — ABNORMAL LOW (ref 135–145)
Total Bilirubin: 0.5 mg/dL (ref 0.3–1.2)
Total Protein: 7.4 g/dL (ref 6.5–8.1)

## 2022-08-04 LAB — URINALYSIS, ROUTINE W REFLEX MICROSCOPIC
Bilirubin Urine: NEGATIVE
Glucose, UA: 150 mg/dL — AB
Hgb urine dipstick: NEGATIVE
Ketones, ur: 5 mg/dL — AB
Leukocytes,Ua: NEGATIVE
Nitrite: NEGATIVE
Protein, ur: NEGATIVE mg/dL
Specific Gravity, Urine: 1.008 (ref 1.005–1.030)
pH: 8 (ref 5.0–8.0)

## 2022-08-04 LAB — POCT URINALYSIS DIPSTICK
Bilirubin, UA: NEGATIVE
Blood, UA: NEGATIVE
Glucose, UA: POSITIVE — AB
Ketones, UA: NEGATIVE
Nitrite, UA: NEGATIVE
Protein, UA: NEGATIVE
Spec Grav, UA: 1.01 (ref 1.010–1.025)
Urobilinogen, UA: 0.2 E.U./dL
pH, UA: 6 (ref 5.0–8.0)

## 2022-08-04 LAB — LIPASE, BLOOD: Lipase: 43 U/L (ref 11–51)

## 2022-08-04 LAB — PROTIME-INR
INR: 3.1 — ABNORMAL HIGH (ref 0.8–1.2)
Prothrombin Time: 31.5 seconds — ABNORMAL HIGH (ref 11.4–15.2)

## 2022-08-04 LAB — MAGNESIUM: Magnesium: 2.1 mg/dL (ref 1.7–2.4)

## 2022-08-04 MED ORDER — IOHEXOL 350 MG/ML SOLN
60.0000 mL | Freq: Once | INTRAVENOUS | Status: AC | PRN
  Administered 2022-08-04: 60 mL via INTRAVENOUS

## 2022-08-04 MED ORDER — ONDANSETRON HCL 4 MG/2ML IJ SOLN
4.0000 mg | Freq: Once | INTRAMUSCULAR | Status: AC
  Administered 2022-08-04: 4 mg via INTRAVENOUS
  Filled 2022-08-04: qty 2

## 2022-08-04 MED ORDER — MORPHINE SULFATE (PF) 4 MG/ML IV SOLN
4.0000 mg | Freq: Once | INTRAVENOUS | Status: AC
  Administered 2022-08-04: 4 mg via INTRAVENOUS
  Filled 2022-08-04: qty 1

## 2022-08-04 MED ORDER — OXYCODONE-ACETAMINOPHEN 5-325 MG PO TABS
1.0000 | ORAL_TABLET | Freq: Once | ORAL | Status: AC
  Administered 2022-08-05: 1 via ORAL
  Filled 2022-08-04: qty 1

## 2022-08-04 MED ORDER — SODIUM CHLORIDE 0.9 % IV BOLUS
500.0000 mL | Freq: Once | INTRAVENOUS | Status: AC
  Administered 2022-08-04: 500 mL via INTRAVENOUS

## 2022-08-04 NOTE — Progress Notes (Signed)
Patient seen today. Sent by ems to [facility] for further evaluation. [name]

## 2022-08-04 NOTE — Assessment & Plan Note (Signed)
Clinically presents as acute abdomen.  DDX; appendicitis, enteritis, partial bowel obstruction. Urine culture negative

## 2022-08-04 NOTE — Progress Notes (Signed)
Urine culture negative ?lp

## 2022-08-04 NOTE — Progress Notes (Signed)
Acute Office Visit  Subjective:    Patient ID: Pamela [name], female    DOB: 12-10-1945, 76 y.o.   MRN: [id number]  Chief Complaint  Patient presents with   Abdominal Pain    HPI: Patient is in today for acute lower abdominal pain which started about 1 week ago.  Right lower abdomen to all the way to left, radiating to back and to the groin. Pain is constant specially when trying to walk, laying in bed and trying to bend over and picking something from floor. Pain level is 10 out of 10. Last BM was yesterday morning but just a little bit, denies nausea, vomiting. Eating is okay, passing gas only sometimes. having SHORTNESS OF BREATH off and on since last week.  Dr. Henrene Pastor saw her on 08/02/2022 and treated her for possible pyelonephritis. Given Keflex. CBC normal. CMP: mild renal insuff DDIMER: elevated. Patient is currently on coumadin.  Urine culture came back today: no growth. Taking heating pad, tylenol and using cane or walker all the time.  08/02/2022: CT of abdomen/pelvis with contrast: Scattered air-fluid levels in nondilated small bowel through out. Possible enteritis or small bowel ileus. Also said possible resolving pyelonephritis.  Appendix was not viewed, but no surrounding pericecal inflammatory process seen. Patient has not had appendix out.  Aortic atherosclerosis present. Atheromatous plaque at the origins of the celiac trunk and SMA without high grade stenosis.    Past Medical History:  Diagnosis Date   Anticoagulated on Coumadin    chronic--- managed by hematology/ oncology,   Asthma, mild intermittent    followed by pcp--- per pt last exacerbation winter 2021 w/ acute bronchitis   Bilateral leg cramps    Chronic constipation    CKD (chronic kidney disease), stage III ([place])    Closed bimalleolar fracture of left ankle 02/26/2020   DDD (degenerative disc disease), cervical    w/ spondylosis,  per pt last steroid injection 06/ 2022   DDD  (degenerative disc disease), lumbosacral    DVT (deep venous thrombosis) ([place]) 07/30/2013   Dyspnea    occasionally   GERD (gastroesophageal reflux disease)    History of cardiac murmur as a child    History of DVT of lower extremity    left lower extremity in 1980s, fell when bowling   History of pulmonary embolus (PE) 1993   per pt left lung post op 2 wks cholecystectomy   History of rheumatic fever as a child    per last echo 12-31-2019 no valvular issues   History of TIA (transient ischemic attack)    2014 and 2018 or 2019,  per pt no residual   HTN (hypertension)    followed by pcp   Hyperlipidemia    IDA (iron deficiency anemia)    Macular degeneration of both eyes    Mild obstructive sleep apnea    per pt dx 2017 tried to uses cpap but intolerant   Mixed incontinence urge and stress    urologist--- dr Nila Nephew   OA (osteoarthritis) 07/06/2018   Osteoporosis    PAF (paroxysmal atrial fibrillation) ([place]) 10/01/2014   cardiologist--- dr Godfrey Pick tobb;   cardiac cath 02-18-2013 normal coronaries arteries, ef 50%, cath done since echo showed ef 30-35%; nucleat stress study 04/ 2020 normal , normal echo 04/ 2021,  event monitor 09-14-2020 rare ST/AT variable block   PONV (postoperative nausea and vomiting)    Protein S deficiency ([place])    followed by hemotology/ oncology-- dr d. Bobby Rumpf ([facility]) dx 1980s;  prior DVT left lower leg 1980s and left lung PE 1993; chronic  coumadin since 1980s   PVC's (premature ventricular contractions)    followed by cardiology   S/P cardiac catheterization 02/2013   Normal coronaries; low normal EF at 50%   Solitary pulmonary nodule on lung CT 02/06/2019   First noted 01/13/2014 > no change as of 12/21/2018   Spondylolisthesis, lumbar region 08/09/2018   Stroke ([place])    TIA in 2018 or 2019   Transient ischemic attack 07/30/2013   Type 2 diabetes mellitus treated with insulin ([place])    endocrilogist--- whitney reardon NP      (03-10-2021  pt continuously checks blood sugar throughout the day w/ Libre, fasting sugar --- 69--200)   Wears glasses    Wears hearing aid in both ears     Past Surgical History:  Procedure Laterality Date   ABDOMINAL HYSTERECTOMY     BLEPHAROPLASTY     CARDIAC CATHETERIZATION  02/18/2013   @ [place]  by Dr Martinique;  normal coronaries w/ preserved LVF, ef 50%;   previous cath 2001 normal ef 65%   CARPAL TUNNEL RELEASE Bilateral 1994   CATARACT EXTRACTION W/ INTRAOCULAR LENS IMPLANT Bilateral 2017   CHOLECYSTECTOMY, LAPAROSCOPIC  1993   COLONOSCOPY     EAR BIOPSY Left    FINGER SURGERY Left 2018   thumb   FOOT SURGERY     FOOT TENDON SURGERY Right    early 2000s   HAND SURGERY Right 04/2022   HARDWARE REMOVAL Left 03/12/2021   Procedure: HARDWARE REMOVAL;  Surgeon: Nicholes Stairs, MD;  Location: [facility];  Service: Orthopedics;  Laterality: Left;  60 MINS   ORIF ANKLE FRACTURE Left 02/26/2020   Procedure: OPEN REDUCTION INTERNAL FIXATION (ORIF) ANKLE FRACTURE;  Surgeon: Nicholes Stairs, MD;  Location: [facility];  Service: Orthopedics;  Laterality: Left;  90 mins   TONSILLECTOMY AND ADENOIDECTOMY Bilateral    TOTAL KNEE ARTHROPLASTY Left 03/2005   TOTAL VAGINAL HYSTERECTOMY  1988   per pt still has ovaries    Family History  Problem Relation Age of Onset   Cirrhosis Mother    Antithrombin III deficiency Mother        multiple emboli   Ulcerative colitis Mother    Diabetes Father    Coronary artery disease Father    Heart attack Father    Hypertension Father    Kidney disease Father    Hypertension Sister    Diabetes Sister    Heart attack Sister        age 24    Protein S deficiency Daughter    Heart attack Brother    Hypertension Brother    Lung cancer Brother    Stroke Neg Hx    Colitis Neg Hx    Colon polyps Neg Hx    Esophageal cancer Neg Hx    Liver cancer Neg Hx    Pancreatic cancer Neg Hx    Rectal cancer Neg Hx    Stomach cancer  Neg Hx    Breast cancer Neg Hx     Social History   Socioeconomic History   Marital status: Married    Spouse name: [name]   Number of children: Not on file   Years of education: Not on file   Highest education level: Not on file  Occupational History   Not on file  Tobacco Use   Smoking status: Never   Smokeless tobacco: Never  Vaping Use   Vaping Use: Never used  Substance and Sexual Activity   Alcohol use: No   Drug use: Never   Sexual activity: Not on file  Other Topics Concern   Not on file  Social History Narrative   Lives with spouse in [place].  2 grown daughters.   Retired Glass blower/designer     Social Determinants of [place] Strain: Low Risk  (05/25/2022)   Overall Financial Resource Strain (CARDIA)    Difficulty of Paying Living Expenses: Not hard at all  Food Insecurity: No Food Insecurity (09/30/2020)   Hunger Vital Sign    Worried About Running Out of Food in the Last Year: Never true    [place] in the Last Year: Never true  Transportation Needs: No Transportation Needs (03/23/2022)   PRAPARE - Hydrologist (Medical): No    Lack of Transportation (Non-Medical): No  Physical Activity: Inactive (05/25/2022)   Exercise Vital Sign    Days of Exercise per Week: 0 days    Minutes of Exercise per Session: 0 min  Stress: Not on file  Social Connections: Not on file  Intimate Partner Violence: Not on file    Outpatient Medications Prior to Visit  Medication Sig Dispense Refill   albuterol (VENTOLIN HFA) 108 (90 Base) MCG/ACT inhaler INHALE 1 TO 2 PUFFS INTO THE LUNGS EVERY 6 HOURS AS NEEDED FOR WHEEZING OR SHORTNESS OF BREATH 6.7 g 3   amLODipine (NORVASC) 5 MG tablet TAKE 1 TABLET(5 MG) BY MOUTH DAILY 90 tablet 3   aspirin EC (ASPIRIN LOW DOSE) 81 MG tablet TAKE 1 TABLET(81 MG) BY MOUTH DAILY 90 tablet 3   atorvastatin (LIPITOR) 80 MG tablet TAKE 1 TABLET(80 MG) BY MOUTH DAILY 90 tablet 3   azelastine  (ASTELIN) 0.1 % nasal spray Place 2 sprays into both nostrils daily as needed for rhinitis or allergies. 30 mL 6   B Complex-C (B-COMPLEX WITH VITAMIN C) tablet Take 1 tablet by mouth daily with lunch.     Calcium Citrate-Vitamin D (CALCIUM CITRATE + PO) Take by mouth.     cephALEXin (KEFLEX) 500 MG capsule Take 1 capsule (500 mg total) by mouth 3 (three) times daily. 30 capsule 0   Cholecalciferol (VITAMIN D3) 50 MCG (2000 UT) TABS Take 4,000 Units by mouth daily with lunch.     clindamycin (CLEOCIN) 300 MG capsule Take 300 mg by mouth every 6 (six) hours. (Patient not taking: Reported on 08/02/2022)     Continuous Blood Gluc Sensor (FREESTYLE LIBRE 2 SENSOR) MISC 2 each by Does not apply route every 14 (fourteen) days. E11.40 6 each 3   dapagliflozin propanediol (FARXIGA) 10 MG TABS tablet Take 1 tablet (10 mg total) by mouth daily before breakfast. 90 tablet 3   denosumab (PROLIA) 60 MG/ML SOSY injection 60 mg by sub-q route.     enoxaparin (LOVENOX) 80 MG/0.8ML injection Inject 0.8 mLs (80 mg total) into the skin every 12 (twelve) hours. (Patient not taking: Reported on 08/02/2022) 20 mL 0   estradiol (ESTRACE) 0.1 MG/GM vaginal cream Place 1 Applicatorful vaginally 2 (two) times a week. Uses twice weekly     ferrous sulfate 325 (65 FE) MG tablet Take 325 mg by mouth daily with lunch.     furosemide (LASIX) 40 MG tablet Take 1 tablet (40 mg total) by mouth daily. 90 tablet 3   GEMTESA 75 MG TABS Take by mouth.  Insulin Aspart FlexPen (NOVOLOG) 100 UNIT/ML Max daily 30 units 15 mL 6   insulin glargine, 1 Unit Dial, (TOUJEO) 300 UNIT/ML Solostar Pen Inject 8 Units into the skin at bedtime. 15 mL 3   Insulin Pen Needle (BD PEN NEEDLE NANO 2ND GEN) 32G X 4 MM MISC USE DAILY AS DIRECTED 200 each 3   ipratropium-albuterol (DUONEB) 0.5-2.5 (3) MG/3ML SOLN Take 3 mLs by nebulization 4 (four) times daily as needed (wheezing/shortness of breath).  0   isosorbide mononitrate (IMDUR) 30 MG 24 hr tablet  1 tablet by mouth daily, 2nd attempt, please reach arrange appt for further refills 90 tablet 3   lisinopril (ZESTRIL) 20 MG tablet TAKE 2 TABLETS(40 MG) BY MOUTH TWICE DAILY 180 tablet 3   magnesium oxide (MAG-OX) 400 (240 Mg) MG tablet TAKE 2 TABLETS(800 MG) BY MOUTH TWICE DAILY (Patient taking differently: Take 800 mg by mouth 2 (two) times daily.) 120 tablet 2   methocarbamol (ROBAXIN) 750 MG tablet Take 1 tablet by mouth 3 (three) times daily.     Multiple Vitamins-Minerals (PRESERVISION AREDS 2 PO) Take by mouth.     nystatin cream (MYCOSTATIN) APPLY TO AREA OF RED IRRITATED RASH 3 TO 4 TIMES A DAY FOR SKIN YEAST INFECTION     ondansetron (ZOFRAN) 4 MG tablet Take 1 tablet (4 mg total) by mouth every 8 (eight) hours as needed for nausea or vomiting. 15 tablet 0   ondansetron (ZOFRAN-ODT) 8 MG disintegrating tablet Take 8 mg by mouth every 8 (eight) hours as needed for nausea or vomiting.      oxyCODONE-acetaminophen (PERCOCET/ROXICET) 5-325 MG tablet Take 1 tablet by mouth every 6 (six) hours as needed.     pantoprazole (PROTONIX) 40 MG tablet TAKE 1 TABLET(40 MG) BY MOUTH EVERY MORNING 90 tablet 3   polyethylene glycol (MIRALAX / GLYCOLAX) 17 g packet Take 17 g by mouth as needed. Uses 1-2 times weekly     traZODone (DESYREL) 50 MG tablet Take 1 tablet (50 mg total) by mouth at bedtime as needed for sleep. 90 tablet 0   warfarin (COUMADIN) 1 MG tablet Take 1 tablet (1 mg total) by mouth as directed. 120 tablet 2   warfarin (COUMADIN) 5 MG tablet TAKE 1 TABLET BY MOUTH EVERY DAY 90 tablet 1   No facility-administered medications prior to visit.    Allergies  Allergen Reactions   Ketek [Telithromycin] Nausea And Vomiting and Rash   Loxapine Succinate Hives   Naproxen Sodium Anaphylaxis and Hives   Amoxapine And Related Hives   Darvon Nausea And Vomiting   Belsomra [Suvorexant] Other (See Comments)    "Nightmares"   Cymbalta [Duloxetine Hcl] Other (See Comments)    "Blackouts"    Duloxetine    Gabapentin Other (See Comments)    Blurry vision   Lyrica [Pregabalin]     Makes her sedated   Amoxicillin Rash   Propoxyphene Nausea And Vomiting    Review of Systems  Constitutional:  Positive for chills and fatigue. Negative for fever.  HENT:  Negative for congestion.   Respiratory:  Positive for shortness of breath. Negative for cough.   Cardiovascular:  Positive for palpitations. Negative for chest pain.  Gastrointestinal:  Positive for abdominal pain (lower abdominal pain). Negative for constipation (had small bm yesterday. passing gas.), nausea and vomiting.  Genitourinary:  Negative for dysuria.  Musculoskeletal:  Positive for back pain.  Neurological:  Positive for light-headedness. Negative for dizziness and headaches.  Psychiatric/Behavioral:  Negative for dysphoric mood.  The patient is not nervous/anxious.        Objective:    Physical Exam Vitals reviewed.  Constitutional:      General: She is in acute distress.     Appearance: Normal appearance. She is well-developed.  Neck:     Vascular: No carotid bruit.  Cardiovascular:     Rate and Rhythm: Normal rate. Rhythm irregular.     Heart sounds: Normal heart sounds.  Pulmonary:     Effort: Pulmonary effort is normal. No respiratory distress.     Breath sounds: Normal breath sounds.  Abdominal:     General: Abdomen is flat. Bowel sounds are increased.     Palpations: Abdomen is soft.     Tenderness: There is generalized abdominal tenderness and tenderness in the right lower quadrant. There is right CVA tenderness, guarding and rebound. Positive signs include Rovsing's sign.     Comments: Markel test positive.   Neurological:     Mental Status: She is alert and oriented to person, place, and time.  Psychiatric:        Mood and Affect: Mood normal.        Behavior: Behavior normal.     BP 128/64   Pulse 84   Temp (!) 97.3 F (36.3 C)   Resp 16   Ht 5' (1.524 m)   Wt 162 lb 9.6 oz (73.8 kg)    BMI 31.76 kg/m  Wt Readings from Last 3 Encounters:  08/04/22 162 lb 9.6 oz (73.8 kg)  08/02/22 159 lb (72.1 kg)  07/15/22 158 lb 9.6 oz (71.9 kg)    Health Maintenance Due  Topic Date Due   Hepatitis C Screening  Never done   Zoster Vaccines- Shingrix (1 of 2) Never done   COVID-19 Vaccine (6 - 2023-24 season) 05/06/2022    There are no preventive care reminders to display for this patient.   Lab Results  Component Value Date   TSH 1.480 05/02/2022   Lab Results  Component Value Date   WBC 9.7 08/02/2022   HGB 12.4 08/02/2022   HCT 36.8 08/02/2022   MCV 90 08/02/2022   PLT 319 08/02/2022   Lab Results  Component Value Date   NA 134 08/02/2022   K 5.0 08/02/2022   CO2 24 08/02/2022   GLUCOSE 259 (H) 08/02/2022   BUN 27 08/02/2022   CREATININE 1.34 (H) 08/02/2022   BILITOT 0.5 08/02/2022   ALKPHOS 98 08/02/2022   AST 23 08/02/2022   ALT 24 08/02/2022   PROT 6.8 08/02/2022   ALBUMIN 3.9 08/02/2022   CALCIUM 9.7 08/02/2022   ANIONGAP 8 06/06/2022   EGFR 41 (L) 08/02/2022   GFR 57.98 (L) 03/10/2020   Lab Results  Component Value Date   CHOL 143 05/02/2022   Lab Results  Component Value Date   HDL 70 05/02/2022   Lab Results  Component Value Date   LDLCALC 57 05/02/2022   Lab Results  Component Value Date   TRIG 86 05/02/2022   Lab Results  Component Value Date   CHOLHDL 2.0 05/02/2022   Lab Results  Component Value Date   HGBA1C 7.6 (A) 05/27/2022       Assessment & Plan:   Problem List Items Addressed This Visit       Cardiovascular and Mediastinum   Atrial fibrillation [I48.91]     Other   Acute abdomen - Primary    Clinically presents as acute abdomen.  DDX; appendicitis, enteritis, partial bowel obstruction. Urine culture  negative      Relevant Orders   POCT urinalysis dipstick (Completed)   RLQ abdominal pain    Clinically presents as acute abdomen.  DDX; appendicitis, enteritis, partial bowel obstruction. Urine culture  negative. SEE CT SCAN DONE ON 08/02/2022.      No orders of the defined types were placed in this encounter.   Orders Placed This Encounter  Procedures   POCT urinalysis dipstick     Follow-up: No follow-ups on file.  An After Visit Summary was printed and given to the patient.  [name], MD [facility] [phone]

## 2022-08-04 NOTE — Assessment & Plan Note (Addendum)
Clinically presents as acute abdomen.  DDX; appendicitis, enteritis, partial bowel obstruction. Urine culture negative. SEE CT SCAN DONE ON 08/02/2022.

## 2022-08-04 NOTE — ED Provider Notes (Signed)
[facility] EMERGENCY DEPARTMENT Provider Note   CSN: [id number] Arrival date & time: 08/04/22  1724     History  Chief Complaint  Patient presents with   Abdominal Pain    P[name] is a 76 y.o. female.  He is here with a complaint of lower abdominal pain that is been going on for about a week.  It is across her whole lower abdomen and radiates into her back.  She rates the pain as 10 out of 10.  She was seen for this last month and was told she had a kidney infection.  She is not having any urine symptoms.  No fevers or chills.  She is having some nausea no vomiting.  No diarrhea no urinary symptoms no vaginal bleeding or discharge.  She had an outpatient CT done by her PCP 2 days ago that showed some small bowel thickening question enteritis.  The history is provided by the patient.  Abdominal Pain Pain location:  Suprapubic, LLQ and RLQ Pain quality: aching and stabbing   Pain radiates to:  Back Pain severity:  Severe Onset quality:  Gradual Timing:  Constant Progression:  Unchanged Chronicity:  Recurrent Context: not trauma   Relieved by:  Nothing Worsened by:  Nothing Ineffective treatments:  None tried Associated symptoms: constipation and nausea   Associated symptoms: no chest pain, no cough, no diarrhea, no dysuria, no fever, no hematemesis, no hematochezia, no hematuria, no shortness of breath, no vaginal bleeding, no vaginal discharge and no vomiting        Home Medications Prior to Admission medications   Medication Sig Start Date End Date Taking? Authorizing Provider  albuterol (VENTOLIN HFA) 108 (90 Base) MCG/ACT inhaler INHALE 1 TO 2 PUFFS INTO THE LUNGS EVERY 6 HOURS AS NEEDED FOR WHEEZING OR SHORTNESS OF BREATH 02/04/22   Marge Duncans, PA-C  amLODipine (NORVASC) 5 MG tablet TAKE 1 TABLET(5 MG) BY MOUTH DAILY 07/15/22   Tobb, Kardie, DO  aspirin EC (ASPIRIN LOW DOSE) 81 MG tablet TAKE 1 TABLET(81 MG) BY MOUTH DAILY 08/02/22    Tobb, Kardie, DO  atorvastatin (LIPITOR) 80 MG tablet TAKE 1 TABLET(80 MG) BY MOUTH DAILY 07/15/22   Tobb, Kardie, DO  azelastine (ASTELIN) 0.1 % nasal spray Place 2 sprays into both nostrils daily as needed for rhinitis or allergies. 09/10/21   Marge Duncans, PA-C  B Complex-C (B-COMPLEX WITH VITAMIN C) tablet Take 1 tablet by mouth daily with lunch.    [provider]  Calcium Citrate-Vitamin D (CALCIUM CITRATE + PO) Take by mouth.    [provider]  cephALEXin (KEFLEX) 500 MG capsule Take 1 capsule (500 mg total) by mouth 3 (three) times daily. 08/02/22   Lillard Anes, MD  Cholecalciferol (VITAMIN D3) 50 MCG (2000 UT) TABS Take 4,000 Units by mouth daily with lunch.    [provider]  clindamycin (CLEOCIN) 300 MG capsule Take 300 mg by mouth every 6 (six) hours. Patient not taking: Reported on 08/02/2022 06/07/22   [provider]  Continuous Blood Gluc Sensor (FREESTYLE LIBRE 2 SENSOR) MISC 2 each by Does not apply route every 14 (fourteen) days. E11.40 11/20/20   CoxElnita Maxwell, MD  dapagliflozin propanediol (FARXIGA) 10 MG TABS tablet Take 1 tablet (10 mg total) by mouth daily before breakfast. 05/27/22   Shamleffer, Melanie Crazier, MD  denosumab (PROLIA) 60 MG/ML SOSY injection 60 mg by sub-q route.    [provider]  enoxaparin (LOVENOX) 80 MG/0.8ML injection  Inject 0.8 mLs (80 mg total) into the skin every 12 (twelve) hours. Patient not taking: Reported on 08/02/2022 07/13/22   Rosanne Sack A, PA-C  estradiol (ESTRACE) 0.1 MG/GM vaginal cream Place 1 Applicatorful vaginally 2 (two) times a week. Uses twice weekly 06/10/20   [provider]  ferrous sulfate 325 (65 FE) MG tablet Take 325 mg by mouth daily with lunch.    [provider]  furosemide (LASIX) 40 MG tablet Take 1 tablet (40 mg total) by mouth daily. 07/15/22   Tobb, Kardie, DO  GEMTESA 75 MG TABS Take by mouth. 11/09/21   [provider]  Insulin Aspart  FlexPen (NOVOLOG) 100 UNIT/ML Max daily 30 units 03/30/22   Shamleffer, Melanie Crazier, MD  insulin glargine, 1 Unit Dial, (TOUJEO) 300 UNIT/ML Solostar Pen Inject 8 Units into the skin at bedtime. 05/27/22   Shamleffer, Melanie Crazier, MD  Insulin Pen Needle (BD PEN NEEDLE NANO 2ND GEN) 32G X 4 MM MISC USE DAILY AS DIRECTED 02/07/22   Cox, Kirsten, MD  ipratropium-albuterol (DUONEB) 0.5-2.5 (3) MG/3ML SOLN Take 3 mLs by nebulization 4 (four) times daily as needed (wheezing/shortness of breath). 11/07/17   [provider]  isosorbide mononitrate (IMDUR) 30 MG 24 hr tablet 1 tablet by mouth daily, 2nd attempt, please reach arrange appt for further refills 07/15/22   Tobb, Kardie, DO  lisinopril (ZESTRIL) 20 MG tablet TAKE 2 TABLETS(40 MG) BY MOUTH TWICE DAILY 07/15/22   Tobb, Kardie, DO  magnesium oxide (MAG-OX) 400 (240 Mg) MG tablet TAKE 2 TABLETS(800 MG) BY MOUTH TWICE DAILY Patient taking differently: Take 800 mg by mouth 2 (two) times daily. 02/15/21   Lillard Anes, MD  methocarbamol (ROBAXIN) 750 MG tablet Take 1 tablet by mouth 3 (three) times daily. 09/15/21   [provider]  Multiple Vitamins-Minerals (PRESERVISION AREDS 2 PO) Take by mouth.    [provider]  nystatin cream (MYCOSTATIN) APPLY TO AREA OF RED IRRITATED RASH 3 TO 4 TIMES A DAY FOR SKIN YEAST INFECTION    [provider]  ondansetron (ZOFRAN) 4 MG tablet Take 1 tablet (4 mg total) by mouth every 8 (eight) hours as needed for nausea or vomiting. 06/06/22   Hayden Rasmussen, MD  ondansetron (ZOFRAN-ODT) 8 MG disintegrating tablet Take 8 mg by mouth every 8 (eight) hours as needed for nausea or vomiting.     [provider]  oxyCODONE-acetaminophen (PERCOCET/ROXICET) 5-325 MG tablet Take 1 tablet by mouth every 6 (six) hours as needed. 04/18/22   [provider]  pantoprazole (PROTONIX) 40 MG tablet TAKE 1 TABLET(40 MG) BY MOUTH EVERY MORNING 05/15/22   Cox, Kirsten, MD   polyethylene glycol (MIRALAX / GLYCOLAX) 17 g packet Take 17 g by mouth as needed. Uses 1-2 times weekly    [provider]  traZODone (DESYREL) 50 MG tablet Take 1 tablet (50 mg total) by mouth at bedtime as needed for sleep. 03/30/22   CoxElnita Maxwell, MD  warfarin (COUMADIN) 1 MG tablet Take 1 tablet (1 mg total) by mouth as directed. 02/11/22   Mosher, Vida Roller A, PA-C  warfarin (COUMADIN) 5 MG tablet TAKE 1 TABLET BY MOUTH EVERY DAY 02/03/22   Marice Potter, MD      Allergies    Ketek [telithromycin], Loxapine succinate, Naproxen sodium, Amoxapine and related, Darvon, Belsomra [suvorexant], Cymbalta [duloxetine hcl], Duloxetine, Gabapentin, Lyrica [pregabalin], Amoxicillin, and Propoxyphene    Review of Systems   Review of Systems  Constitutional:  Negative for  fever.  Respiratory:  Negative for cough and shortness of breath.   Cardiovascular:  Negative for chest pain.  Gastrointestinal:  Positive for abdominal pain, constipation and nausea. Negative for diarrhea, hematemesis, hematochezia and vomiting.  Genitourinary:  Negative for dysuria, hematuria, vaginal bleeding and vaginal discharge.    Physical Exam Updated Vital Signs BP (!) 159/80   Pulse 78   Temp 98.1 F (36.7 C)   Resp (!) 25   SpO2 100%  Physical Exam Vitals and nursing note reviewed.  Constitutional:      General: She is not in acute distress.    Appearance: She is well-developed.  HENT:     Head: Normocephalic and atraumatic.  Eyes:     Conjunctiva/sclera: Conjunctivae normal.  Cardiovascular:     Rate and Rhythm: Normal rate. Rhythm irregular.     Heart sounds: No murmur heard. Pulmonary:     Effort: Pulmonary effort is normal. No respiratory distress.     Breath sounds: Normal breath sounds.  Abdominal:     Palpations: Abdomen is soft.     Tenderness: There is abdominal tenderness in the right lower quadrant, suprapubic area and left lower quadrant. There is no guarding or rebound.   Musculoskeletal:        General: No swelling.     Cervical back: Neck supple.  Skin:    General: Skin is warm and dry.     Capillary Refill: Capillary refill takes less than 2 seconds.  Neurological:     General: No focal deficit present.     Mental Status: She is alert.     ED Results / Procedures / Treatments   Labs (all labs ordered are listed, but only abnormal results are displayed) Labs Reviewed  COMPREHENSIVE METABOLIC PANEL - Abnormal; Notable for the following components:      Result Value   Sodium 134 (*)    Chloride 96 (*)    Glucose, Bld 168 (*)    BUN 34 (*)    Creatinine, Ser 1.44 (*)    GFR, Estimated 37 (*)    All other components within normal limits  URINALYSIS, ROUTINE W REFLEX MICROSCOPIC - Abnormal; Notable for the following components:   Color, Urine STRAW (*)    Glucose, UA 150 (*)    Ketones, ur 5 (*)    All other components within normal limits  PROTIME-INR - Abnormal; Notable for the following components:   Prothrombin Time 31.5 (*)    INR 3.1 (*)    All other components within normal limits  LIPASE, BLOOD  CBC  MAGNESIUM    EKG EKG Interpretation  Date/Time:  Friday August 05 2022 00:03:01 EST Ventricular Rate:  73 PR Interval:  162 QRS Duration: 104 QT Interval:  389 QTC Calculation: 429 R Axis:   -1 Text Interpretation: Sinus rhythm Ventricular premature complex LAE, consider biatrial enlargement Anteroseptal infarct, old Minimal ST depression, lateral leads No significant change since prior 10/23 Confirmed by Aletta Edouard [phone]) on 08/05/2022 12:04:33 AM  Radiology CT Abdomen Pelvis W Contrast  Result Date: 08/04/2022 CLINICAL DATA:  Right lower quadrant pain. EXAM: CT ABDOMEN AND PELVIS WITH CONTRAST TECHNIQUE: Multidetector CT imaging of the abdomen and pelvis was performed using the standard protocol following bolus administration of intravenous contrast. RADIATION DOSE REDUCTION: This exam was performed according to the  departmental dose-optimization program which includes automated exposure control, adjustment of the mA and/or kV according to patient size and/or use of iterative reconstruction technique. CONTRAST:  42m OMNIPAQUE IOHEXOL  350 MG/ML SOLN COMPARISON:  August 02, 2022 FINDINGS: Lower chest: No acute abnormality. Hepatobiliary: No focal liver abnormality is seen. Status post cholecystectomy. No biliary dilatation. Pancreas: Unremarkable. No pancreatic ductal dilatation or surrounding inflammatory changes. Spleen: Normal in size without focal abnormality. Adrenals/Urinary Tract: Adrenal glands are unremarkable. Kidneys are normal, without renal calculi, focal lesion, or hydronephrosis. The urinary bladder is markedly distended and is otherwise unremarkable. Stomach/Bowel: Stomach is within normal limits. The appendix is normal in appearance. No evidence of bowel wall thickening, distention, or inflammatory changes. Noninflamed diverticula are seen throughout the sigmoid colon. Vascular/Lymphatic: Aortic atherosclerosis. No enlarged abdominal or pelvic lymph nodes. Reproductive: Status post hysterectomy. No adnexal masses. Other: Multiple small, stable areas of low attenuation are seen within the subcutaneous fat of the anterior abdominal wall. No abdominopelvic ascites. Musculoskeletal: There is stable grade 1 anterolisthesis of the L4 vertebral body on L5. Multilevel degenerative changes seen throughout the lumbar spine, most prominent at the level of L2-L3. IMPRESSION: 1. Sigmoid diverticulosis. 2. Evidence of prior cholecystectomy. 3. Stable grade 1 anterolisthesis of the L4 vertebral body on L5. 4. Aortic atherosclerosis. Aortic Atherosclerosis (ICD10-I70.0). Electronically Signed   By: Virgina Norfolk M.D.   On: 08/04/2022 21:58   DG Chest 2 View  Result Date: 08/04/2022 CLINICAL DATA:  Shortness of breath.  Atrial fibrillation. EXAM: CHEST - 2 VIEW COMPARISON:  06/06/2022 FINDINGS: Stable linear densities  in both lungs are suggestive for small areas of atelectasis and scarring. Otherwise, the lungs are clear without airspace disease or consolidation. No pulmonary edema. Heart size is normal. No pleural effusions. No acute bone abnormality. Degenerative changes at the right glenohumeral joint. Probable small calcification near the left lung apex. IMPRESSION: No active cardiopulmonary disease. Electronically Signed   By: Markus Daft M.D.   On: 08/04/2022 18:36    Procedures Procedures    Medications Ordered in ED Medications  sodium chloride 0.9 % bolus 500 mL (0 mLs Intravenous Stopped 08/04/22 2227)  morphine (PF) 4 MG/ML injection 4 mg (4 mg Intravenous Given 08/04/22 1951)  ondansetron (ZOFRAN) injection 4 mg (4 mg Intravenous Given 08/04/22 1950)  iohexol (OMNIPAQUE) 350 MG/ML injection 60 mL (60 mLs Intravenous Contrast Given 08/04/22 2144)  oxyCODONE-acetaminophen (PERCOCET/ROXICET) 5-325 MG per tablet 1 tablet (1 tablet Oral Given 08/05/22 0003)    ED Course/ Medical Decision Making/ A&P                           Medical Decision Making Amount and/or Complexity of Data Reviewed Labs: ordered. Radiology: ordered.  Risk Prescription drug management.   This patient complains of recurrent low abdominal pain; this involves an extensive number of treatment Options and is a complaint that carries with it a high risk of complications and morbidity. The differential includes diverticulitis, colitis, constipation, enteritis, UTI, ischemic bowel  I ordered, reviewed and interpreted labs, which included CBC normal chemistries normal Mild elevation in creatinine, INR supratherapeutic, urinalysis without signs of infection I ordered medication IV fluids and pain medication nausea medication, oral pain medicine and reviewed PMP when indicated. I ordered imaging studies which included chest x-ray and CT abdomen and pelvis and I independently    visualized and interpreted imaging which showed no  acute findings Additional history obtained from patient's daughters Previous records obtained and reviewed in epic including recent CAT scan results Cardiac monitoring reviewed, sinus with frequent PVCs Social determinants considered, patient physically inactive Critical Interventions: None  After the interventions stated  above, I reevaluated the patient and found patient's pain to be improved but not resolved.  I reviewed the results of her work-up with her. Admission and further testing considered, no indications for admission or further work-up at this time.  Will refer back to GI as she has seen them in the past.  We will also give short prescription of some pain medication.  Patient and daughter is comfortable plan for outpatient follow-up with her PCP and GI.  Return instructions discussed         Final Clinical Impression(s) / ED Diagnoses Final diagnoses:  Lower abdominal pain    Rx / DC Orders ED Discharge Orders          Ordered    oxyCODONE-acetaminophen (PERCOCET/ROXICET) 5-325 MG tablet  Every 6 hours PRN        08/05/22 0015    Ambulatory referral to Gastroenterology        08/05/22 0015              Hayden Rasmussen, MD 08/05/22 1046

## 2022-08-04 NOTE — ED Notes (Signed)
Patient returned from CT

## 2022-08-04 NOTE — ED Provider Triage Note (Signed)
Emergency Medicine Provider Triage Evaluation Note  Jaxson Keener , a 76 y.o. female  was evaluated in triage.  Pt complains of abd pain starting Friday. No vomiting but has had nausea. No fevers. Went to [place] and CTAP shows ilius vs enteritis - she came to [facility] because she did not want any kind of surgeon to see her at [place].  Review of Systems  Positive: Abd pain, nausea Negative: Vomiting   Physical Exam  BP (!) 166/71 (BP Location: Left Arm)   Pulse (!) 46   Temp 98.1 F (36.7 C)   Resp 16   SpO2 100%  Gen:   Awake, no distress   Resp:  Normal effort  MSK:   Moves extremities without difficulty  Other:  Abd TTP  Medical Decision Making  Medically screening exam initiated at 5:40 PM.  Appropriate orders placed.  Yarenis Kirandeep Fariss was informed that the remainder of the evaluation will be completed by another provider, this initial triage assessment does not replace that evaluation, and the importance of remaining in the ED until their evaluation is complete.  Labs, CT read at bedside   Tedd Sias, [place] 08/04/22 1743

## 2022-08-04 NOTE — ED Triage Notes (Addendum)
[facility] B[name] from PCP for evaluation of RLQ abdominal pain. Denies dizzines, reports nausea and 10/10 pain. Abdominal CT on 11/28, report concern for enteritis or small bowel ileus. Patient is alert, oriented, and in no apparent distress at this time.

## 2022-08-05 ENCOUNTER — Telehealth: Payer: Self-pay

## 2022-08-05 DIAGNOSIS — R1031 Right lower quadrant pain: Secondary | ICD-10-CM | POA: Diagnosis not present

## 2022-08-05 MED ORDER — OXYCODONE-ACETAMINOPHEN 5-325 MG PO TABS: 1.0000 | ORAL_TABLET | Freq: Four times a day (QID) | ORAL | 0 refills | Status: DC | PRN

## 2022-08-05 NOTE — Telephone Encounter (Signed)
Patient has a f/u appt scheduled with Dr. Havery Moros on 08/11/22 at 3:20 pm.

## 2022-08-05 NOTE — Discharge Instructions (Signed)
You were seen in the emergency department for low abdominal pain.  You had lab work CAT scan and urinalysis that did not show an obvious explanation for your symptoms.  Your warfarin level was mildly high at 3.1.  We are prescribing you a short course of some pain medication.  This may be constipating.  Please contact your primary care doctor for follow-up.  We have also put a referral in for you to see gastroenterology.  Return to the emergency department if any worsening or concerning symptoms

## 2022-08-05 NOTE — Telephone Encounter (Signed)
Armbruster, Carlota Raspberry, MD  Yevette Edwards, RN Can you please forward to schedulers for office follow up. From ED visit. Thanks

## 2022-08-08 ENCOUNTER — Ambulatory Visit: Payer: Medicare PPO | Admitting: Internal Medicine

## 2022-08-09 ENCOUNTER — Encounter: Payer: Self-pay | Admitting: Legal Medicine

## 2022-08-09 ENCOUNTER — Ambulatory Visit: Payer: Medicare PPO | Attending: Cardiology

## 2022-08-09 ENCOUNTER — Ambulatory Visit (INDEPENDENT_AMBULATORY_CARE_PROVIDER_SITE_OTHER): Payer: Medicare PPO | Admitting: Legal Medicine

## 2022-08-09 VITALS — BP 122/70 | HR 71 | Temp 97.2°F | Resp 16 | Ht 60.0 in | Wt 159.8 lb

## 2022-08-09 DIAGNOSIS — R252 Cramp and spasm: Secondary | ICD-10-CM

## 2022-08-09 DIAGNOSIS — I10 Essential (primary) hypertension: Secondary | ICD-10-CM | POA: Diagnosis not present

## 2022-08-09 DIAGNOSIS — K5909 Other constipation: Secondary | ICD-10-CM

## 2022-08-09 NOTE — Progress Notes (Unsigned)
Subjective:  Patient ID: Pamela [name], female    DOB: 1946/05/07  Age: 76 y.o. MRN: [id number]  Chief Complaint  Patient presents with   follow up on abdominal pain    HPI   Patient still has ab pain and its better at times then can be worse, went to ER same workup, still having pain and no symptom relief.  She has chronic constipation. No major blockages in celiac trunks. No regular BM for 6 weeks.  [name] also spoke with her. Current Outpatient Medications on File Prior to Visit  Medication Sig Dispense Refill   albuterol (VENTOLIN HFA) 108 (90 Base) MCG/ACT inhaler INHALE 1 TO 2 PUFFS INTO THE LUNGS EVERY 6 HOURS AS NEEDED FOR WHEEZING OR SHORTNESS OF BREATH 6.7 g 3   amLODipine (NORVASC) 5 MG tablet TAKE 1 TABLET(5 MG) BY MOUTH DAILY 90 tablet 3   aspirin EC (ASPIRIN LOW DOSE) 81 MG tablet TAKE 1 TABLET(81 MG) BY MOUTH DAILY 90 tablet 3   atorvastatin (LIPITOR) 80 MG tablet TAKE 1 TABLET(80 MG) BY MOUTH DAILY 90 tablet 3   azelastine (ASTELIN) 0.1 % nasal spray Place 2 sprays into both nostrils daily as needed for rhinitis or allergies. 30 mL 6   B Complex-C (B-COMPLEX WITH VITAMIN C) tablet Take 1 tablet by mouth daily with lunch.     Calcium Citrate-Vitamin D (CALCIUM CITRATE + PO) Take by mouth.     cephALEXin (KEFLEX) 500 MG capsule Take 1 capsule (500 mg total) by mouth 3 (three) times daily. 30 capsule 0   Cholecalciferol (VITAMIN D3) 50 MCG (2000 UT) TABS Take 4,000 Units by mouth daily with lunch.     Continuous Blood Gluc Sensor (FREESTYLE LIBRE 2 SENSOR) MISC 2 each by Does not apply route every 14 (fourteen) days. E11.40 6 each 3   dapagliflozin propanediol (FARXIGA) 10 MG TABS tablet Take 1 tablet (10 mg total) by mouth daily before breakfast. 90 tablet 3   estradiol (ESTRACE) 0.1 MG/GM vaginal cream Place 1 Applicatorful vaginally 2 (two) times a week. Uses twice weekly     ferrous sulfate 325 (65 FE) MG tablet Take 325 mg by mouth daily with lunch.      furosemide (LASIX) 40 MG tablet Take 1 tablet (40 mg total) by mouth daily. 90 tablet 3   GEMTESA 75 MG TABS Take by mouth.     Insulin Aspart FlexPen (NOVOLOG) 100 UNIT/ML Max daily 30 units 15 mL 6   insulin glargine, 1 Unit Dial, (TOUJEO) 300 UNIT/ML Solostar Pen Inject 8 Units into the skin at bedtime. 15 mL 3   Insulin Pen Needle (BD PEN NEEDLE NANO 2ND GEN) 32G X 4 MM MISC USE DAILY AS DIRECTED 200 each 3   ipratropium-albuterol (DUONEB) 0.5-2.5 (3) MG/3ML SOLN Take 3 mLs by nebulization 4 (four) times daily as needed (wheezing/shortness of breath).  0   isosorbide mononitrate (IMDUR) 30 MG 24 hr tablet 1 tablet by mouth daily, 2nd attempt, please reach arrange appt for further refills 90 tablet 3   lisinopril (ZESTRIL) 20 MG tablet TAKE 2 TABLETS(40 MG) BY MOUTH TWICE DAILY 180 tablet 3   magnesium oxide (MAG-OX) 400 (240 Mg) MG tablet TAKE 2 TABLETS(800 MG) BY MOUTH TWICE DAILY (Patient taking differently: Take 800 mg by mouth 2 (two) times daily.) 120 tablet 2   nystatin cream (MYCOSTATIN) APPLY TO AREA OF RED IRRITATED RASH 3 TO 4 TIMES A DAY FOR SKIN YEAST INFECTION     ondansetron (  ZOFRAN) 4 MG tablet Take 1 tablet (4 mg total) by mouth every 8 (eight) hours as needed for nausea or vomiting. 15 tablet 0   ondansetron (ZOFRAN-ODT) 8 MG disintegrating tablet Take 8 mg by mouth every 8 (eight) hours as needed for nausea or vomiting.      oxyCODONE-acetaminophen (PERCOCET/ROXICET) 5-325 MG tablet Take 1 tablet by mouth every 6 (six) hours as needed. 15 tablet 0   pantoprazole (PROTONIX) 40 MG tablet TAKE 1 TABLET(40 MG) BY MOUTH EVERY MORNING 90 tablet 3   traZODone (DESYREL) 50 MG tablet Take 1 tablet (50 mg total) by mouth at bedtime as needed for sleep. 90 tablet 0   warfarin (COUMADIN) 1 MG tablet Take 1 tablet (1 mg total) by mouth as directed. 120 tablet 2   warfarin (COUMADIN) 5 MG tablet TAKE 1 TABLET BY MOUTH EVERY DAY 90 tablet 1   denosumab (PROLIA) 60 MG/ML SOSY injection 60 mg  by sub-q route. (Patient not taking: Reported on 08/09/2022)     No current facility-administered medications on file prior to visit.   Past Medical History:  Diagnosis Date   Anticoagulated on Coumadin    chronic--- managed by hematology/ oncology,   Asthma, mild intermittent    followed by pcp--- per pt last exacerbation winter 2021 w/ acute bronchitis   Bilateral leg cramps    Chronic constipation    CKD (chronic kidney disease), stage III ([place])    Closed bimalleolar fracture of left ankle 02/26/2020   DDD (degenerative disc disease), cervical    w/ spondylosis,  per pt last steroid injection 06/ 2022   DDD (degenerative disc disease), lumbosacral    DVT (deep venous thrombosis) ([place]) 07/30/2013   Dyspnea    occasionally   GERD (gastroesophageal reflux disease)    History of cardiac murmur as a child    History of DVT of lower extremity    left lower extremity in 1980s, fell when bowling   History of pulmonary embolus (PE) 1993   per pt left lung post op 2 wks cholecystectomy   History of rheumatic fever as a child    per last echo 12-31-2019 no valvular issues   History of TIA (transient ischemic attack)    2014 and 2018 or 2019,  per pt no residual   HTN (hypertension)    followed by pcp   Hyperlipidemia    IDA (iron deficiency anemia)    Macular degeneration of both eyes    Mild obstructive sleep apnea    per pt dx 2017 tried to uses cpap but intolerant   Mixed incontinence urge and stress    urologist--- dr Nila Nephew   OA (osteoarthritis) 07/06/2018   Osteoporosis    PAF (paroxysmal atrial fibrillation) ([place]) 10/01/2014   cardiologist--- dr Godfrey Pick tobb;   cardiac cath 02-18-2013 normal coronaries arteries, ef 50%, cath done since echo showed ef 30-35%; nucleat stress study 04/ 2020 normal , normal echo 04/ 2021,  event monitor 09-14-2020 rare ST/AT variable block   PONV (postoperative nausea and vomiting)    Protein S deficiency ([place])    followed by hemotology/ oncology--  dr d. Bobby Rumpf ([facility]) dx 1980s;  prior DVT left lower leg 1980s and left lung PE 1993; chronic  coumadin since 1980s   PVC's (premature ventricular contractions)    followed by cardiology   S/P cardiac catheterization 02/2013   Normal coronaries; low normal EF at 50%   Solitary pulmonary nodule on lung CT 02/06/2019   First  noted 01/13/2014 > no change as of 12/21/2018   Spondylolisthesis, lumbar region 08/09/2018   Stroke ([place])    TIA in 2018 or 2019   Transient ischemic attack 07/30/2013   Type 2 diabetes mellitus treated with insulin ([place])    endocrilogist--- whitney reardon NP     (03-10-2021  pt continuously checks blood sugar throughout the day w/ Libre, fasting sugar --- 69--200)   Wears glasses    Wears hearing aid in both ears    Past Surgical History:  Procedure Laterality Date   ABDOMINAL HYSTERECTOMY     BLEPHAROPLASTY     CARDIAC CATHETERIZATION  02/18/2013   @ [place]  by Dr Martinique;  normal coronaries w/ preserved LVF, ef 50%;   previous cath 2001 normal ef 65%   CARPAL TUNNEL RELEASE Bilateral 1994   CATARACT EXTRACTION W/ INTRAOCULAR LENS IMPLANT Bilateral 2017   CHOLECYSTECTOMY, LAPAROSCOPIC  1993   COLONOSCOPY     EAR BIOPSY Left    FINGER SURGERY Left 2018   thumb   FOOT SURGERY     FOOT TENDON SURGERY Right    early 2000s   HAND SURGERY Right 04/2022   HARDWARE REMOVAL Left 03/12/2021   Procedure: HARDWARE REMOVAL;  Surgeon: Nicholes Stairs, MD;  Location: [facility];  Service: Orthopedics;  Laterality: Left;  60 MINS   ORIF ANKLE FRACTURE Left 02/26/2020   Procedure: OPEN REDUCTION INTERNAL FIXATION (ORIF) ANKLE FRACTURE;  Surgeon: Nicholes Stairs, MD;  Location: [place];  Service: Orthopedics;  Laterality: Left;  90 mins   TONSILLECTOMY AND ADENOIDECTOMY Bilateral    TOTAL KNEE ARTHROPLASTY Left 03/2005   TOTAL VAGINAL HYSTERECTOMY  1988   per pt still has ovaries    Family History  Problem Relation Age of Onset    Cirrhosis Mother    Antithrombin III deficiency Mother        multiple emboli   Ulcerative colitis Mother    Diabetes Father    Coronary artery disease Father    Heart attack Father    Hypertension Father    Kidney disease Father    Hypertension Sister    Diabetes Sister    Heart attack Sister        age 80    Protein S deficiency Daughter    Heart attack Brother    Hypertension Brother    Lung cancer Brother    Stroke Neg Hx    Colitis Neg Hx    Colon polyps Neg Hx    Esophageal cancer Neg Hx    Liver cancer Neg Hx    Pancreatic cancer Neg Hx    Rectal cancer Neg Hx    Stomach cancer Neg Hx    Breast cancer Neg Hx    Social History   Socioeconomic History   Marital status: Married    Spouse name: [name]   Number of children: Not on file   Years of education: Not on file   Highest education level: Not on file  Occupational History   Not on file  Tobacco Use   Smoking status: Never   Smokeless tobacco: Never  Vaping Use   Vaping Use: Never used  Substance and Sexual Activity   Alcohol use: No   Drug use: Never   Sexual activity: Not on file  Other Topics Concern   Not on file  Social History Narrative   Lives with spouse in [place].  2 grown daughters.   Retired Glass blower/designer  Social Determinants of Health   Financial Resource Strain: Low Risk  (05/25/2022)   Overall Financial Resource Strain (CARDIA)    Difficulty of Paying Living Expenses: Not hard at all  Food Insecurity: No Food Insecurity (09/30/2020)   Hunger Vital Sign    Worried About Running Out of Food in the Last Year: Never true    Ran Out of Food in the Last Year: Never true  Transportation Needs: No Transportation Needs (03/23/2022)   PRAPARE - Hydrologist (Medical): No    Lack of Transportation (Non-Medical): No  Physical Activity: Inactive (05/25/2022)   Exercise Vital Sign    Days of Exercise per Week: 0 days    Minutes of Exercise per Session: 0 min   Stress: Not on file  Social Connections: Not on file    Review of Systems  Constitutional:  Negative for chills and fatigue.  HENT:  Negative for congestion, ear pain, sinus pain and sore throat.   Eyes:  Negative for visual disturbance.  Respiratory:  Negative for cough and shortness of breath.   Cardiovascular:  Negative for chest pain and leg swelling.  Gastrointestinal:  Positive for abdominal pain and constipation. Negative for diarrhea, nausea and vomiting.  Endocrine: Negative for polyuria.  Genitourinary:  Negative for dysuria and frequency.  Musculoskeletal:  Negative for arthralgias, back pain and myalgias.  Neurological:  Negative for dizziness and headaches.  Psychiatric/Behavioral: Negative.       Objective:  BP 122/70   Pulse 71   Temp (!) 97.2 F (36.2 C)   Resp 16   Ht 5' (1.524 m)   Wt 159 lb 12.8 oz (72.5 kg)   SpO2 97%   BMI 31.21 kg/m      08/09/2022    1:23 PM 08/05/2022   12:30 AM 08/05/2022   12:00 AM  BP/Weight  Systolic BP 106 269 485  Diastolic BP 70 66 70  Wt. (Lbs) 159.8    BMI 31.21 kg/m2      Physical Exam Vitals reviewed.  Constitutional:      General: She is not in acute distress.    Appearance: Normal appearance.  HENT:     Head: Normocephalic.     Right Ear: Tympanic membrane normal.     Left Ear: Tympanic membrane normal.     Nose: Nose normal.     Mouth/Throat:     Mouth: Mucous membranes are moist.     Pharynx: Oropharynx is clear.  Eyes:     Extraocular Movements: Extraocular movements intact.     Conjunctiva/sclera: Conjunctivae normal.     Pupils: Pupils are equal, round, and reactive to light.  Cardiovascular:     Rate and Rhythm: Normal rate and regular rhythm.     Pulses: Normal pulses.     Heart sounds: No murmur heard.    No gallop.  Pulmonary:     Effort: Pulmonary effort is normal. No respiratory distress.     Breath sounds: Normal breath sounds. No wheezing.  Abdominal:     General: Abdomen is flat.  Bowel sounds are normal. There is no distension.     Tenderness: There is no abdominal tenderness.  Musculoskeletal:        General: Normal range of motion.     Cervical back: Normal range of motion and neck supple.  Skin:    General: Skin is warm.     Capillary Refill: Capillary refill takes less than 2 seconds.  Neurological:  General: No focal deficit present.     Mental Status: She is alert and oriented to person, place, and time. Mental status is at baseline.         Lab Results  Component Value Date   WBC 7.3 08/04/2022   HGB 13.0 08/04/2022   HCT 39.0 08/04/2022   PLT 351 08/04/2022   GLUCOSE 168 (H) 08/04/2022   CHOL 143 05/02/2022   TRIG 86 05/02/2022   HDL 70 05/02/2022   LDLCALC 57 05/02/2022   ALT 25 08/04/2022   AST 29 08/04/2022   NA 134 (L) 08/04/2022   K 4.0 08/04/2022   CL 96 (L) 08/04/2022   CREATININE 1.44 (H) 08/04/2022   BUN 34 (H) 08/04/2022   CO2 24 08/04/2022   TSH 1.480 05/02/2022   INR 3.1 (H) 08/04/2022   HGBA1C 7.6 (A) 05/27/2022   MICROALBUR 10 06/15/2021      Assessment & Plan:   Problem List Items Addressed This Visit       Digestive   Chronic constipation - Primary Patient continues to have some abdominal pain some epigastric but mostly in the right lower quadrant and is better when she flexes her right hip she [facility] and found to have minimal arthritis and recently had an MRI of the back including the hips which was negative she had two CT scans that showed no abnormalities all her blood work was normal I told her to stop her Keflex so that she can get her epidurals for her chronic back pain.  We discussed that Her concern for her and we will continue to try to workup her problem.  She definitely is having chronic constipation for the last 6 months and has been only using stool softeners.  I will start her on Linzess 72 mg 1 a day I gave her 8 samples and if this works to help her have regular bowel movements  without pain we will call in a prescription.  Otherwise I am turning this case over to [name]  A total of 35 minutes were spent face-to-face with the patient during this encounter and over half of that time was spent on counseling and coordination of care.       .       Follow-up: Return in about 1 month (around 09/09/2022), or dr. cox.  An After Visit Summary was printed and given to the patient.  Reinaldo Meeker, MD [facility] [phone]

## 2022-08-10 ENCOUNTER — Ambulatory Visit: Payer: Medicare PPO

## 2022-08-11 ENCOUNTER — Telehealth: Payer: Self-pay

## 2022-08-11 ENCOUNTER — Ambulatory Visit (INDEPENDENT_AMBULATORY_CARE_PROVIDER_SITE_OTHER): Payer: Medicare PPO | Admitting: Gastroenterology

## 2022-08-11 ENCOUNTER — Encounter: Payer: Self-pay | Admitting: Gastroenterology

## 2022-08-11 VITALS — BP 122/86 | HR 75 | Ht 60.0 in | Wt 158.0 lb

## 2022-08-11 DIAGNOSIS — N399 Disorder of urinary system, unspecified: Secondary | ICD-10-CM | POA: Diagnosis not present

## 2022-08-11 DIAGNOSIS — K5900 Constipation, unspecified: Secondary | ICD-10-CM | POA: Diagnosis not present

## 2022-08-11 DIAGNOSIS — R1031 Right lower quadrant pain: Secondary | ICD-10-CM

## 2022-08-11 DIAGNOSIS — M25551 Pain in right hip: Secondary | ICD-10-CM | POA: Insufficient documentation

## 2022-08-11 HISTORY — DX: Pain in right hip: M25.551

## 2022-08-11 NOTE — Patient Instructions (Signed)
You can take over the counter Miralax twice day every day and titrate up or down as needed.   Follow up with your orthopedist and urologist.   The [facility] providers would like to encourage you to use [facility] to communicate with providers for non-urgent requests or questions.  Due to long hold times on the telephone, sending your provider a message by [facility] may be a faster and more efficient way to get a response.  Please allow 48 business hours for a response.  Please remember that this is for non-urgent requests.

## 2022-08-11 NOTE — Progress Notes (Signed)
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  She was last seen in July 2020.  She is accompanied by her daughter today.  She complains of pain that has been bothering her for the past 1 month or so intermittently.  Pain localized to starting in the right inguinal area and then can radiate up into her right lower quadrant and also into her lower back.  She states this is very positional, whenever she goes to a standing position or sits up, she can feel this pain much worse and it is reproduced.  She can feel it also when she is at rest but positional changes clearly trigger it.  She has been feeling it every day and it is getting more frequent and worse over time.  She states she rates it at "10 out of 10" when she has it with movements.  She denies any clear triggers for this aside of positional changes.  She has been eating okay without changes, eating does not make this worse, she has some gas and bloating at baseline.  She states she has been a bit more constipated lately, sometimes has been going upwards of a few days without a bowel movement and hard to get stool out. Having a bowel movement does not improve her pain. Prior to symptoms she was moving her bowels more frequently although unclear if her constipation is related to narcotics as she has been given some oxycodone for this pain she has been having.  She denies any blood in her stools.  Her colonoscopy was last done in 2020 without any high risk or concerning lesions.  She also has been having problems urinating.  She states she feels like she cannot empty her bladder completely and this is been ongoing for 1 month, she needs to put pressure in her suprapubic area to help get urine out.  She has not seen a urologist for this.  She denies any radicular symptoms of pain down her legs.  She denies weakness but pain can  go into her lower back.  She has not had any nausea or vomiting.  No fevers.  She has had workup with 3 imaging studies over the course of recent months as outlined.  Initially with the symptoms she had pyelonephritis back in October.  Treated with antibiotics.  She then had a follow-up CT scan done on November 28 for which she provided the report (this CT is not listed in epic).  This was a CT scan with IV contrast that showed some scattered air-fluid levels in the small bowel without any pathologic dilation, could reflect enteritis or ileus.  The previous abnormal hypoenhancement of the right kidney had nearly completely resolved, no evidence of active pyelonephritis.  She has had some moderate degenerative changes in her back and hips.  She subsequently was seen again in the ER with another CT scan on November 30 without any clear cause for her symptoms, prior changes of the small intestine had resolved, no active pyelonephritis. These imaging studies have shown degenerative changes in her hips and lumbar spine as well. She has last had labs in the setting of ongoing pain on November 30 which showed a normal white blood cell count, normal hemoglobin, liver enzymes normal.  No FH of colon cancer, esophageal cancer, or gastric cancer. Mother had history of blood clots, antithrombin III mutation.  Prior workup: EGD 03/15/2019: - A 2 cm hiatal hernia was present. - The Z-line was slightly irregular and was found 37 cm from the incisors, with 2 areas of extension of salmon colored mucosa roughly 79m above the normal SCJ. Biopsies were taken with a cold forceps for histology. - The exam of the esophagus was otherwise normal. No obvious stenosis / stricrture noted. - A guidewire was placed and the scope was withdrawn. Empiric dilation was performed in the entire esophagus with a Savary dilator with moderate resistance at 17 mm and 18 mm. Relook endoscopy showed no mucosal wrents. - Patchy mild  inflammation characterized by erythema was found in the gastric body and in the gastric antrum, along with atrophic appearing mucosa, without focal ulceration. Biopsies were taken with a cold forceps for Helicobacter pylori testing of the antrum and body / incisura. - The exam of the stomach was otherwise normal. - The duodenal bulb and second portion of the duodenum were normal other than a diverticulum in the second portion.   Colonoscopy 03/15/2019: The perianal and digital rectal examinations were normal. - The terminal ileum appeared normal. - A 3 mm polyp was found in the transverse colon. The polyp was sessile. The polyp was removed with a cold snare. Resection and retrieval were complete. - Multiple small-mouthed diverticula were found in the left colon. - The colon was tortuous which prolonged the procedure. - Internal hemorrhoids were found during retroflexion. - The exam was otherwise without abnormality.  1. Surgical [P], gastric antrum and gastric body - REACTIVE GASTROPATHY - NO H. PYLORI OR INTESTINAL METAPLASIA IDENTIFIED - SEE COMMENT 2. Surgical [P], random sites - MILD CHRONIC GASTRITIS WITHOUT ACTIVITY - NO H. PYLORI OR INTESTINAL METAPLASIA IDENTIFIED - SEE COMMENT 3. Surgical [P], colon, transverse, polyp - TUBULAR ADENOMA (1 OF 1 FRAGMENTS) - NO HIGH GRADE DYSPLASIA OR MALIGNANCY IDENTIFIED  1. A Warthin-Starry stain is performed to determine the possibility of the presence of Helicobacter pylori. The Warthin-Starry stain is negative for organisms morphologically consistent with Helicobacter pylori. 2. A Warthin-Starry stain is performed to determine the possibility of the presence of Helicobacter pylori. The Warthin-Starry stain is negative for organisms morphologically consistent with Helicobacter pylori.    CT angio C/A/P 06/06/22: IMPRESSION: 1. No evidence of acute aortic process. 2. Findings of urinary tract infection with cystitis and  RIGHT pyelonephritis with areas of more focal nephritis scattered about the RIGHT kidney. Correlate with urinalysis. 3. Given indistinct appearance of areas of the RIGHT kidney would suggest follow-up with renal sonogram to ensure resolution of lobular and mildly infiltrative appearing areas which in the setting of only a single phase exam are almost certainly related to pyelonephritis as outlined above but not as well characterized as could be with a more standard venous phase assessment. Follow-up at 6-8 weeks is suggested. 4. No signs of central or lobar level pulmonary embolism. 5. Replaced RIGHT hepatic artery. 6. Aortic atherosclerosis. 7. Small pericardial effusion that is unchanged.    CT scan 08/02/2022: (hard copy to be scanned into Chart - not in Epic) Scatterred air fluid levels of small bowel, could represent enteritis or ileus Improvement of renal findings   CT abdomen / pelvis with contrast 08/04/2022: IMPRESSION: 1. Sigmoid diverticulosis. 2. Evidence of prior cholecystectomy. 3. Stable grade 1 anterolisthesis of the L4 vertebral body on L5. 4. Aortic atherosclerosis.    Past Medical History:  Diagnosis Date   Anticoagulated on Coumadin    chronic--- managed by hematology/ oncology,  Asthma, mild intermittent    followed by pcp--- per pt last exacerbation winter 2021 w/ acute bronchitis   Bilateral leg cramps    Chronic constipation    CKD (chronic kidney disease), stage III (HCC)    Closed bimalleolar fracture of left ankle 02/26/2020   DDD (degenerative disc disease), cervical    w/ spondylosis,  per pt last steroid injection 06/ 2022   DDD (degenerative disc disease), lumbosacral    DVT (deep venous thrombosis) ([place]) 07/30/2013   Dyspnea    occasionally   GERD (gastroesophageal reflux disease)    History of cardiac murmur as a child    History of DVT of lower extremity    left lower extremity in 1980s, fell when bowling   History of pulmonary  embolus (PE) 1993   per pt left lung post op 2 wks cholecystectomy   History of rheumatic fever as a child    per last echo 12-31-2019 no valvular issues   History of TIA (transient ischemic attack)    2014 and 2018 or 2019,  per pt no residual   HTN (hypertension)    followed by pcp   Hyperlipidemia    IDA (iron deficiency anemia)    Macular degeneration of both eyes    Mild obstructive sleep apnea    per pt dx 2017 tried to uses cpap but intolerant   Mixed incontinence urge and stress    urologist--- dr Nila Nephew   OA (osteoarthritis) 07/06/2018   Osteoporosis    PAF (paroxysmal atrial fibrillation) ([place]) 10/01/2014   cardiologist--- dr Godfrey Pick tobb;   cardiac cath 02-18-2013 normal coronaries arteries, ef 50%, cath done since echo showed ef 30-35%; nucleat stress study 04/ 2020 normal , normal echo 04/ 2021,  event monitor 09-14-2020 rare ST/AT variable block   PONV (postoperative nausea and vomiting)    Protein S deficiency ([place])    followed by hemotology/ oncology-- dr d. Bobby Rumpf ([facility]) dx 1980s;  prior DVT left lower leg 1980s and left lung PE 1993; chronic  coumadin since 1980s   PVC's (premature ventricular contractions)    followed by cardiology   S/P cardiac catheterization 02/2013   Normal coronaries; low normal EF at 50%   Solitary pulmonary nodule on lung CT 02/06/2019   First noted 01/13/2014 > no change as of 12/21/2018   Spondylolisthesis, lumbar region 08/09/2018   Stroke ([place])    TIA in 2018 or 2019   Transient ischemic attack 07/30/2013   Type 2 diabetes mellitus treated with insulin ([place])    endocrilogist--- whitney reardon NP     (03-10-2021  pt continuously checks blood sugar throughout the day w/ Libre, fasting sugar --- 69--200)   Wears glasses    Wears hearing aid in both ears      Past Surgical History:  Procedure Laterality Date   ABDOMINAL HYSTERECTOMY     BLEPHAROPLASTY     CARDIAC CATHETERIZATION  02/18/2013   @ [place]  by Dr Martinique;   normal coronaries w/ preserved LVF, ef 50%;   previous cath 2001 normal ef 65%   CARPAL TUNNEL RELEASE Bilateral 1994   CATARACT EXTRACTION W/ INTRAOCULAR LENS IMPLANT Bilateral 2017   CHOLECYSTECTOMY, LAPAROSCOPIC  1993   COLONOSCOPY     EAR BIOPSY Left    FINGER SURGERY Left 2018   thumb   FOOT SURGERY     FOOT TENDON SURGERY Right    early 2000s   HAND SURGERY Right 04/2022   HARDWARE REMOVAL Left  03/12/2021   Procedure: HARDWARE REMOVAL;  Surgeon: Nicholes Stairs, MD;  Location: [facility];  Service: Orthopedics;  Laterality: Left;  60 MINS   ORIF ANKLE FRACTURE Left 02/26/2020   Procedure: OPEN REDUCTION INTERNAL FIXATION (ORIF) ANKLE FRACTURE;  Surgeon: Nicholes Stairs, MD;  Location: [place];  Service: Orthopedics;  Laterality: Left;  90 mins   TONSILLECTOMY AND ADENOIDECTOMY Bilateral    TOTAL KNEE ARTHROPLASTY Left 03/2005   TOTAL VAGINAL HYSTERECTOMY  1988   per pt still has ovaries   Family History  Problem Relation Age of Onset   Cirrhosis Mother    Antithrombin III deficiency Mother        multiple emboli   Ulcerative colitis Mother    Diabetes Father    Coronary artery disease Father    Heart attack Father    Hypertension Father    Kidney disease Father    Hypertension Sister    Diabetes Sister    Heart attack Sister        age 78    Protein S deficiency Daughter    Heart attack Brother    Hypertension Brother    Lung cancer Brother    Stroke Neg Hx    Colitis Neg Hx    Colon polyps Neg Hx    Esophageal cancer Neg Hx    Liver cancer Neg Hx    Pancreatic cancer Neg Hx    Rectal cancer Neg Hx    Stomach cancer Neg Hx    Breast cancer Neg Hx    Social History   Tobacco Use   Smoking status: Never   Smokeless tobacco: Never  Vaping Use   Vaping Use: Never used  Substance Use Topics   Alcohol use: No   Drug use: Never   Current Outpatient Medications  Medication Sig Dispense Refill   albuterol (VENTOLIN HFA) 108 (90  Base) MCG/ACT inhaler INHALE 1 TO 2 PUFFS INTO THE LUNGS EVERY 6 HOURS AS NEEDED FOR WHEEZING OR SHORTNESS OF BREATH 6.7 g 3   amLODipine (NORVASC) 5 MG tablet TAKE 1 TABLET(5 MG) BY MOUTH DAILY 90 tablet 3   aspirin EC (ASPIRIN LOW DOSE) 81 MG tablet TAKE 1 TABLET(81 MG) BY MOUTH DAILY 90 tablet 3   atorvastatin (LIPITOR) 80 MG tablet TAKE 1 TABLET(80 MG) BY MOUTH DAILY 90 tablet 3   azelastine (ASTELIN) 0.1 % nasal spray Place 2 sprays into both nostrils daily as needed for rhinitis or allergies. 30 mL 6   Calcium Citrate-Vitamin D (CALCIUM CITRATE + PO) Take by mouth.     cephALEXin (KEFLEX) 500 MG capsule Take 1 capsule (500 mg total) by mouth 3 (three) times daily. 30 capsule 0   Cholecalciferol (VITAMIN D3) 50 MCG (2000 UT) TABS Take 4,000 Units by mouth daily with lunch.     Continuous Blood Gluc Sensor (FREESTYLE LIBRE 2 SENSOR) MISC 2 each by Does not apply route every 14 (fourteen) days. E11.40 6 each 3   dapagliflozin propanediol (FARXIGA) 10 MG TABS tablet Take 1 tablet (10 mg total) by mouth daily before breakfast. 90 tablet 3   denosumab (PROLIA) 60 MG/ML SOSY injection      estradiol (ESTRACE) 0.1 MG/GM vaginal cream Place 1 Applicatorful vaginally 2 (two) times a week. Uses twice weekly     ferrous sulfate 325 (65 FE) MG tablet Take 325 mg by mouth daily with lunch.     furosemide (LASIX) 40 MG tablet Take 1 tablet (40 mg total) by mouth  daily. 90 tablet 3   GEMTESA 75 MG TABS Take by mouth.     Insulin Aspart FlexPen (NOVOLOG) 100 UNIT/ML Max daily 30 units 15 mL 6   insulin glargine, 1 Unit Dial, (TOUJEO) 300 UNIT/ML Solostar Pen Inject 8 Units into the skin at bedtime. 15 mL 3   Insulin Pen Needle (BD PEN NEEDLE NANO 2ND GEN) 32G X 4 MM MISC USE DAILY AS DIRECTED 200 each 3   ipratropium-albuterol (DUONEB) 0.5-2.5 (3) MG/3ML SOLN Take 3 mLs by nebulization 4 (four) times daily as needed (wheezing/shortness of breath).  0   isosorbide mononitrate (IMDUR) 30 MG 24 hr tablet 1  tablet by mouth daily, 2nd attempt, please reach arrange appt for further refills 90 tablet 3   lisinopril (ZESTRIL) 20 MG tablet TAKE 2 TABLETS(40 MG) BY MOUTH TWICE DAILY 180 tablet 3   magnesium oxide (MAG-OX) 400 (240 Mg) MG tablet TAKE 2 TABLETS(800 MG) BY MOUTH TWICE DAILY (Patient taking differently: Take 800 mg by mouth 2 (two) times daily.) 120 tablet 2   Niacin (VITAMIN B-3 PO) Take by mouth.     nystatin cream (MYCOSTATIN) APPLY TO AREA OF RED IRRITATED RASH 3 TO 4 TIMES A DAY FOR SKIN YEAST INFECTION     ondansetron (ZOFRAN) 4 MG tablet Take 1 tablet (4 mg total) by mouth every 8 (eight) hours as needed for nausea or vomiting. 15 tablet 0   ondansetron (ZOFRAN-ODT) 8 MG disintegrating tablet Take 8 mg by mouth every 8 (eight) hours as needed for nausea or vomiting.      oxyCODONE-acetaminophen (PERCOCET/ROXICET) 5-325 MG tablet Take 1 tablet by mouth every 6 (six) hours as needed. 15 tablet 0   pantoprazole (PROTONIX) 40 MG tablet TAKE 1 TABLET(40 MG) BY MOUTH EVERY MORNING 90 tablet 3   traZODone (DESYREL) 50 MG tablet Take 1 tablet (50 mg total) by mouth at bedtime as needed for sleep. 90 tablet 0   warfarin (COUMADIN) 1 MG tablet Take 1 tablet (1 mg total) by mouth as directed. 120 tablet 2   warfarin (COUMADIN) 5 MG tablet TAKE 1 TABLET BY MOUTH EVERY DAY 90 tablet 1   No current facility-administered medications for this visit.   Allergies  Allergen Reactions   Ketek [Telithromycin] Nausea And Vomiting and Rash   Loxapine Succinate Hives   Naproxen Sodium Anaphylaxis and Hives   Amoxapine And Related Hives   Darvon Nausea And Vomiting   Belsomra [Suvorexant] Other (See Comments)    "Nightmares"   Cymbalta [Duloxetine Hcl] Other (See Comments)    "Blackouts"   Duloxetine    Gabapentin Other (See Comments)    Blurry vision   Lyrica [Pregabalin]     Makes her sedated   Amoxicillin Rash   Propoxyphene Nausea And Vomiting     Review of Systems: All systems reviewed  and negative except where noted in HPI.    Lab Results  Component Value Date   WBC 7.3 08/04/2022   HGB 13.0 08/04/2022   HCT 39.0 08/04/2022   MCV 90.3 08/04/2022   PLT 351 08/04/2022    Lab Results  Component Value Date   CREATININE 1.44 (H) 08/04/2022   BUN 34 (H) 08/04/2022   NA 134 (L) 08/04/2022   K 4.0 08/04/2022   CL 96 (L) 08/04/2022   CO2 24 08/04/2022    Lab Results  Component Value Date   ALT 25 08/04/2022   AST 29 08/04/2022   ALKPHOS 84 08/04/2022   BILITOT 0.5 08/04/2022  Physical Exam: BP 122/86   Pulse 75   Ht 5' (1.524 m)   Wt 158 lb (71.7 kg)   BMI 30.86 kg/m  Constitutional: Pleasant,well-developed, female in no acute distress. HEENT: Normocephalic and atraumatic. Conjunctivae are normal. No scleral icterus. Neck supple.  Cardiovascular: Normal rate, regular rhythm.  Pulmonary/chest: Effort normal and breath sounds normal. No wheezing, rales or rhonchi. Abdominal: Soft, nondistended, tenderness to the R inguinal area and RLQ. Echymosis from lovenox inhection, There are no masses palpable. No hepatomegaly. DRE - normal sensation, normal tone, normal squeeze, normal decent. No impaction. CMA Amanda Madan Extremities: no edema. Movement of R leg - inversion and hip evesion causes pain in the inguinal area and RLQ Lymphadenopathy: No cervical adenopathy noted. Neurological: Alert and oriented to person place and time. Skin: Skin is warm and dry. No rashes noted. Psychiatric: Normal mood and affect. Behavior is normal.   ASSESSMENT: 76 y.o. female here for assessment of the following  1. Right hip pain   2. RLQ abdominal pain   3. Constipation, unspecified constipation type   4. Urination disorder    As above, a few months worth of pain that localizes to the right inguinal area rating into right lower quadrant and lower back.  Very positional, can be reproduced on exam with hip rotation.  She has had numerous imaging studies over the past  few months, initially treated for pyelonephritis but symptoms persisted, follow-up CT scans have not shown a clear cause for her symptoms.  Her labs are normal.  She has had some constipation as well as urinary retention.  DRE today shows no fecal impaction, her sensation and tone is normal, her colonoscopy is up-to-date.  Constipation likely worsened with recent narcotic use.  Discussed the situation with the patient and her daughter.  Given her most recent imaging studies with pain, labs, and exam findings, I suspect she may likely have musculoskeletal pain, arising from her right hip with referred pain.  I think she needs to see orthopedics for further evaluation of this, she already has an orthopedic physician and will follow-up with them for this.  She has had degenerative changes in that hip on CT imaging although not sure if they want an MRI or additional x-rays, I would defer that to orthopedics.  Given her urinary retention I also think she needs to follow-up with urology, she states she has a urologist and will contact them as well.  I think it is unlikely that her bowel is driving her pain, but if she is taking intermittent narcotics for this pain, making constipation worse, she needs to be on a good bowel regimen.  Recommend MiraLAX twice daily every day and titrate up or down as needed for goal bowel movement every 1 to 2 days.   PLAN: - follow up with orthopedics for possible referred hip pain - follow up with Urology for urinary issues - Miralax BID every day and titrate up or down as needed. Samples given - reviewed CT Scans and labs, reassured her otherwise - nonspecific bowel findings resolved on most recent images - follow up back up with me as needed, she can contact us for further advice / questions moving forward   Jolly Mango, MD [facility]

## 2022-08-11 NOTE — Progress Notes (Signed)
Chronic Care Management Pharmacy Assistant   Name: Florance Paolillo  MRN: [id number] DOB: [birth year]   Reason for Encounter: Disease State call for DM    Recent office visits:  08/09/22 Reinaldo Meeker MD. Seen for Chronic Constipation. D/C Clindamycin, Enoxaparin Sodium, Methocarbamol, Miralax.   08/04/22 [name] MD. Seen for abdominal pain. No med changes.  08/02/22 Reinaldo Meeker MD. Seen for UTI. Started on Cephalexin '500mg'$ .   Recent consult visits:  08/02/22 (Oncology) Neena Rhymes, Amy RN. Telephone message. She will decrease warfarin to '5mg'$  po qd on Tue, Thursday and Sat. She will take '6mg'$  warfarin on Sun, Mon, Wed, and Friday   07/26/22 (Endocrinology) Shamleffer, Melanie Crazier MD. Telephone message. Decrease Novolog to 4 units with each meal   07/18/22 (Oncology) Agar, Ute. Telephone message. increase her warfarin to '6mg'$  po qd   07/15/22  (Cardiology) Berniece Salines DO. Seen for cramps of lower extremities. No med changes.  Hospital visits:  Medication Reconciliation was completed by comparing discharge summary, patient's EMR and Pharmacy list, and upon discussion with patient.  Admitted to the hospital on 08/04/22 due to Abdominal Pain. Discharge date was 08/05/22. Discharged from [facility]?Medications Started at [facility] Discharge:?? -started oxyCODONE-acetaminophen 5-'325mg'$   -All other medications will remain the same.    Medications: Outpatient Encounter Medications as of 08/11/2022  Medication Sig   albuterol (VENTOLIN HFA) 108 (90 Base) MCG/ACT inhaler INHALE 1 TO 2 PUFFS INTO THE LUNGS EVERY 6 HOURS AS NEEDED FOR WHEEZING OR SHORTNESS OF BREATH   amLODipine (NORVASC) 5 MG tablet TAKE 1 TABLET(5 MG) BY MOUTH DAILY   aspirin EC (ASPIRIN LOW DOSE) 81 MG tablet TAKE 1 TABLET(81 MG) BY MOUTH DAILY   atorvastatin (LIPITOR) 80 MG tablet TAKE 1 TABLET(80 MG) BY MOUTH DAILY   azelastine (ASTELIN) 0.1 % nasal spray Place 2  sprays into both nostrils daily as needed for rhinitis or allergies.   B Complex-C (B-COMPLEX WITH VITAMIN C) tablet Take 1 tablet by mouth daily with lunch.   Calcium Citrate-Vitamin D (CALCIUM CITRATE + PO) Take by mouth.   cephALEXin (KEFLEX) 500 MG capsule Take 1 capsule (500 mg total) by mouth 3 (three) times daily.   Cholecalciferol (VITAMIN D3) 50 MCG (2000 UT) TABS Take 4,000 Units by mouth daily with lunch.   Continuous Blood Gluc Sensor (FREESTYLE LIBRE 2 SENSOR) MISC 2 each by Does not apply route every 14 (fourteen) days. E11.40   dapagliflozin propanediol (FARXIGA) 10 MG TABS tablet Take 1 tablet (10 mg total) by mouth daily before breakfast.   denosumab (PROLIA) 60 MG/ML SOSY injection 60 mg by sub-q route. (Patient not taking: Reported on 08/09/2022)   estradiol (ESTRACE) 0.1 MG/GM vaginal cream Place 1 Applicatorful vaginally 2 (two) times a week. Uses twice weekly   ferrous sulfate 325 (65 FE) MG tablet Take 325 mg by mouth daily with lunch.   furosemide (LASIX) 40 MG tablet Take 1 tablet (40 mg total) by mouth daily.   GEMTESA 75 MG TABS Take by mouth.   Insulin Aspart FlexPen (NOVOLOG) 100 UNIT/ML Max daily 30 units   insulin glargine, 1 Unit Dial, (TOUJEO) 300 UNIT/ML Solostar Pen Inject 8 Units into the skin at bedtime.   Insulin Pen Needle (BD PEN NEEDLE NANO 2ND GEN) 32G X 4 MM MISC USE DAILY AS DIRECTED   ipratropium-albuterol (DUONEB) 0.5-2.5 (3) MG/3ML SOLN Take 3 mLs by nebulization 4 (four) times daily as needed (wheezing/shortness of breath).   isosorbide  mononitrate (IMDUR) 30 MG 24 hr tablet 1 tablet by mouth daily, 2nd attempt, please reach arrange appt for further refills   lisinopril (ZESTRIL) 20 MG tablet TAKE 2 TABLETS(40 MG) BY MOUTH TWICE DAILY   magnesium oxide (MAG-OX) 400 (240 Mg) MG tablet TAKE 2 TABLETS(800 MG) BY MOUTH TWICE DAILY (Patient taking differently: Take 800 mg by mouth 2 (two) times daily.)   nystatin cream (MYCOSTATIN) APPLY TO AREA OF RED  IRRITATED RASH 3 TO 4 TIMES A DAY FOR SKIN YEAST INFECTION   ondansetron (ZOFRAN) 4 MG tablet Take 1 tablet (4 mg total) by mouth every 8 (eight) hours as needed for nausea or vomiting.   ondansetron (ZOFRAN-ODT) 8 MG disintegrating tablet Take 8 mg by mouth every 8 (eight) hours as needed for nausea or vomiting.    oxyCODONE-acetaminophen (PERCOCET/ROXICET) 5-325 MG tablet Take 1 tablet by mouth every 6 (six) hours as needed.   pantoprazole (PROTONIX) 40 MG tablet TAKE 1 TABLET(40 MG) BY MOUTH EVERY MORNING   traZODone (DESYREL) 50 MG tablet Take 1 tablet (50 mg total) by mouth at bedtime as needed for sleep.   warfarin (COUMADIN) 1 MG tablet Take 1 tablet (1 mg total) by mouth as directed.   warfarin (COUMADIN) 5 MG tablet TAKE 1 TABLET BY MOUTH EVERY DAY   No facility-administered encounter medications on file as of 08/11/2022.    Recent Relevant Labs: Lab Results  Component Value Date/Time   HGBA1C 7.6 (A) 05/27/2022 11:45 AM   HGBA1C 7.2 (H) 01/24/2022 09:05 AM   HGBA1C 7.4 (H) 10/18/2021 08:55 AM   MICROALBUR 10 06/15/2021 04:46 PM   MICROALBUR 10 07/29/2020 12:01 PM    Kidney Function Lab Results  Component Value Date/Time   CREATININE 1.44 (H) 08/04/2022 06:08 PM   CREATININE 1.34 (H) 08/02/2022 10:34 AM   GFR 57.98 (L) 03/10/2020 04:46 PM   GFRNONAA 37 (L) 08/04/2022 06:08 PM   GFRAA 70 09/11/2020 11:23 AM     Current antihyperglycemic regimen:  Farxiga '10mg'$  daily  Novolog 100 units Max 30 daily Toujeo 300units- 8 units at bedtime Patient verbally confirms she is taking the above medications as directed. Yes  Do you receive your medications through PAP? No  If Yes, what is the status? Last received?  What recent interventions/DTPs have been made to improve glycemic control:  No changes   Have there been any recent hospitalizations or ED visits since last visit with CPP? Yes  Patient denies hypoglycemic symptoms, including None  Patient denies hyperglycemic  symptoms, including none  How often are you checking your blood sugar? 3-4 times daily. Using a [place] reader  What are your blood sugars ranging?  08/09/22 98, 89, 159, 278, 158, 189, 241 08/10/22 135, 189, 242, 114, 103 12.7.23 185, 251, 316 after meal  On insulin? Yes How many units: Sliding Scale and 8 units   During the week, how often does your blood glucose drop below 70? Never  Are you checking your feet daily/regularly? Yes  Adherence Review: Is the patient currently on a STATIN medication? Yes Is the patient currently on ACE/ARB medication? Yes Does the patient have >5 day gap between last estimated fill dates?   Care Gaps: Last eye exam / Retinopathy Screening? 10/21/21 Last Annual Wellness Visit? 02/15/22 Last Diabetic Foot Exam? 01/24/22   Star Rating Drugs:  Medication:  Last Fill: Day Supply Wilder Glade  06/01/22-05/27/22 [place], [place] Pharmacist Assistant  [phone]

## 2022-08-15 DIAGNOSIS — M5416 Radiculopathy, lumbar region: Secondary | ICD-10-CM | POA: Diagnosis not present

## 2022-08-16 ENCOUNTER — Telehealth: Payer: Self-pay

## 2022-08-16 ENCOUNTER — Other Ambulatory Visit: Payer: Self-pay

## 2022-08-16 MED ORDER — BD PEN NEEDLE NANO 2ND GEN 32G X 4 MM MISC: 3 refills | Status: DC

## 2022-08-16 NOTE — Telephone Encounter (Signed)
Pt's INR is 5.6 today. Pt did receive steroid injections in her back yesterday.

## 2022-08-23 ENCOUNTER — Telehealth: Payer: Self-pay

## 2022-08-23 ENCOUNTER — Other Ambulatory Visit: Payer: Self-pay | Admitting: Hematology and Oncology

## 2022-08-23 MED ORDER — ENOXAPARIN SODIUM 80 MG/0.8ML IJ SOSY: 70.0000 mg | PREFILLED_SYRINGE | Freq: Two times a day (BID) | INTRAMUSCULAR | 0 refills | Status: DC

## 2022-08-23 NOTE — Telephone Encounter (Signed)
Pt asking if she needs to keep warfarin dosage the same? She took warfarin '5mg'$  last night.

## 2022-08-26 NOTE — Addendum Note (Signed)
Addended byDairl Ponder on: 08/26/2022 10:53 AM   Modules accepted: Orders

## 2022-08-30 ENCOUNTER — Telehealth: Payer: Self-pay

## 2022-08-30 DIAGNOSIS — M13841 Other specified arthritis, right hand: Secondary | ICD-10-CM | POA: Diagnosis not present

## 2022-08-30 DIAGNOSIS — M25551 Pain in right hip: Secondary | ICD-10-CM | POA: Diagnosis not present

## 2022-08-30 NOTE — Telephone Encounter (Signed)
Pt called to repot that her INR is 3.6. Message sent to [facility].

## 2022-08-31 ENCOUNTER — Other Ambulatory Visit: Payer: Self-pay | Admitting: Hematology and Oncology

## 2022-09-01 ENCOUNTER — Ambulatory Visit (INDEPENDENT_AMBULATORY_CARE_PROVIDER_SITE_OTHER): Payer: Medicare PPO

## 2022-09-01 DIAGNOSIS — M810 Age-related osteoporosis without current pathological fracture: Secondary | ICD-10-CM

## 2022-09-01 MED ORDER — DENOSUMAB 60 MG/ML ~~LOC~~ SOSY
60.0000 mg | PREFILLED_SYRINGE | Freq: Once | SUBCUTANEOUS | Status: AC
  Administered 2022-09-01: 60 mg via SUBCUTANEOUS

## 2022-09-01 NOTE — Progress Notes (Signed)
   Prolia injection given per order, patient tolerated well.   Erie Noe, LPN 3:70 PM

## 2022-09-06 ENCOUNTER — Telehealth: Payer: Self-pay | Admitting: Hematology and Oncology

## 2022-09-06 DIAGNOSIS — D6859 Other primary thrombophilia: Secondary | ICD-10-CM | POA: Diagnosis not present

## 2022-09-06 DIAGNOSIS — Z7901 Long term (current) use of anticoagulants: Secondary | ICD-10-CM | POA: Diagnosis not present

## 2022-09-06 NOTE — Telephone Encounter (Signed)
Home INR 1.9 today, continue warfarin 2.5 mg Wed and Sun, and 5 mg rest of days. Has delayed dental appt to 1/18. Seeing ortho for hip.

## 2022-09-07 ENCOUNTER — Encounter: Payer: Self-pay | Admitting: Hematology and Oncology

## 2022-09-07 ENCOUNTER — Ambulatory Visit (INDEPENDENT_AMBULATORY_CARE_PROVIDER_SITE_OTHER): Payer: Medicare PPO | Admitting: Family Medicine

## 2022-09-07 ENCOUNTER — Encounter: Payer: Self-pay | Admitting: Family Medicine

## 2022-09-07 VITALS — BP 136/60 | HR 72 | Temp 96.6°F | Resp 14 | Ht 60.0 in | Wt 155.8 lb

## 2022-09-07 DIAGNOSIS — I4891 Unspecified atrial fibrillation: Secondary | ICD-10-CM | POA: Diagnosis not present

## 2022-09-07 DIAGNOSIS — I5030 Unspecified diastolic (congestive) heart failure: Secondary | ICD-10-CM

## 2022-09-07 DIAGNOSIS — D631 Anemia in chronic kidney disease: Secondary | ICD-10-CM

## 2022-09-07 DIAGNOSIS — N1831 Chronic kidney disease, stage 3a: Secondary | ICD-10-CM | POA: Diagnosis not present

## 2022-09-07 DIAGNOSIS — Z23 Encounter for immunization: Secondary | ICD-10-CM

## 2022-09-07 DIAGNOSIS — Z794 Long term (current) use of insulin: Secondary | ICD-10-CM | POA: Diagnosis not present

## 2022-09-07 DIAGNOSIS — I480 Paroxysmal atrial fibrillation: Secondary | ICD-10-CM

## 2022-09-07 DIAGNOSIS — I130 Hypertensive heart and chronic kidney disease with heart failure and stage 1 through stage 4 chronic kidney disease, or unspecified chronic kidney disease: Secondary | ICD-10-CM | POA: Diagnosis not present

## 2022-09-07 DIAGNOSIS — E782 Mixed hyperlipidemia: Secondary | ICD-10-CM | POA: Diagnosis not present

## 2022-09-07 DIAGNOSIS — E1122 Type 2 diabetes mellitus with diabetic chronic kidney disease: Secondary | ICD-10-CM | POA: Diagnosis not present

## 2022-09-07 DIAGNOSIS — K219 Gastro-esophageal reflux disease without esophagitis: Secondary | ICD-10-CM

## 2022-09-07 DIAGNOSIS — D6869 Other thrombophilia: Secondary | ICD-10-CM

## 2022-09-07 DIAGNOSIS — E1140 Type 2 diabetes mellitus with diabetic neuropathy, unspecified: Secondary | ICD-10-CM | POA: Diagnosis not present

## 2022-09-07 DIAGNOSIS — I110 Hypertensive heart disease with heart failure: Secondary | ICD-10-CM

## 2022-09-07 LAB — PROTIME-INR

## 2022-09-07 NOTE — Progress Notes (Signed)
Subjective:  Patient ID: Pamela [name], female    DOB: [birth year]  Age: 77 y.o. MRN: [id number]  Chief Complaint  Patient presents with   Hyperlipidemia   Diabetes    HPI Diabetes:  Complications: Hypertension Glucose checking: Several times a day (CGM) Sees Endocrinology.  Hypoglycemia: no lately Most recent A1C: 7.6% Current medications: Farxiga 10 mg daily, Toujeo 8 units daily. Novolog sliding scale.  4 U before each meal +  171-220 +1 U 221-270 +2 U 271-320 +3 U 321-370 + 4 U 371- 420 + 5 U 421- 470 + 6 U  Last Eye Exam: 10/21/2021 Foot checks: several days.  Hyperlipidemia: Current medications: Atorvastatin 80 mg daily.  Hypertension: Complications: Diabetes, Hyperlipidemia. Current medications: Lisinopril 20 mg 2 daily, Amlodipine 5 mg daily, Aspirin 81 mg daily, Isosorbide 30 mg daily.  Atrial fibrillation: on coumadin.  GERD: protonix 40 mg once daily.   Complaining of right hip pain. Has had injections without improvement. Appt scheduled with Dr. Alvan Dame in January to discuss alternatives.   Protein S Deficiency: Dr. Bobby Rumpf manages her coumadin. Last PT/INR 1.3 yesterday. On coumadin Sun, Wed 2.5 mg, rest of days are 5 mg.   Urge incontinence: on gemtesa 75 mg once daily. Working well.   Current Outpatient Medications on File Prior to Visit  Medication Sig Dispense Refill   albuterol (VENTOLIN HFA) 108 (90 Base) MCG/ACT inhaler INHALE 1 TO 2 PUFFS INTO THE LUNGS EVERY 6 HOURS AS NEEDED FOR WHEEZING OR SHORTNESS OF BREATH 6.7 g 3   amLODipine (NORVASC) 5 MG tablet TAKE 1 TABLET(5 MG) BY MOUTH DAILY 90 tablet 3   aspirin EC (ASPIRIN LOW DOSE) 81 MG tablet TAKE 1 TABLET(81 MG) BY MOUTH DAILY 90 tablet 3   atorvastatin (LIPITOR) 80 MG tablet TAKE 1 TABLET(80 MG) BY MOUTH DAILY 90 tablet 3   azelastine (ASTELIN) 0.1 % nasal spray Place 2 sprays into both nostrils daily as needed for rhinitis or allergies. 30 mL 6   Calcium Citrate-Vitamin D  (CALCIUM CITRATE + PO) Take by mouth.     Cholecalciferol (VITAMIN D3) 50 MCG (2000 UT) TABS Take 4,000 Units by mouth daily with lunch.     Continuous Blood Gluc Sensor (FREESTYLE LIBRE 2 SENSOR) MISC 2 each by Does not apply route every 14 (fourteen) days. E11.40 6 each 3   dapagliflozin propanediol (FARXIGA) 10 MG TABS tablet Take 1 tablet (10 mg total) by mouth daily before breakfast. 90 tablet 3   denosumab (PROLIA) 60 MG/ML SOSY injection      estradiol (ESTRACE) 0.1 MG/GM vaginal cream Place 1 Applicatorful vaginally 2 (two) times a week. Uses twice weekly     ferrous sulfate 325 (65 FE) MG tablet Take 325 mg by mouth daily with lunch.     furosemide (LASIX) 40 MG tablet Take 1 tablet (40 mg total) by mouth daily. 90 tablet 3   GEMTESA 75 MG TABS Take by mouth.     Insulin Aspart FlexPen (NOVOLOG) 100 UNIT/ML Max daily 30 units 15 mL 6   insulin glargine, 1 Unit Dial, (TOUJEO) 300 UNIT/ML Solostar Pen Inject 8 Units into the skin at bedtime. 15 mL 3   Insulin Pen Needle (BD PEN NEEDLE NANO 2ND GEN) 32G X 4 MM MISC USE DAILY AS DIRECTED 200 each 3   ipratropium-albuterol (DUONEB) 0.5-2.5 (3) MG/3ML SOLN Take 3 mLs by nebulization 4 (four) times daily as needed (wheezing/shortness of breath).  0   isosorbide mononitrate (IMDUR) 30 MG 24  hr tablet 1 tablet by mouth daily, 2nd attempt, please reach arrange appt for further refills 90 tablet 3   lisinopril (ZESTRIL) 20 MG tablet TAKE 2 TABLETS(40 MG) BY MOUTH TWICE DAILY 180 tablet 3   magnesium oxide (MAG-OX) 400 (240 Mg) MG tablet TAKE 2 TABLETS(800 MG) BY MOUTH TWICE DAILY (Patient taking differently: Take 800 mg by mouth 2 (two) times daily.) 120 tablet 2   Niacin (VITAMIN B-3 PO) Take by mouth.     nystatin cream (MYCOSTATIN) APPLY TO AREA OF RED IRRITATED RASH 3 TO 4 TIMES A DAY FOR SKIN YEAST INFECTION     ondansetron (ZOFRAN-ODT) 8 MG disintegrating tablet Take 8 mg by mouth every 8 (eight) hours as needed for nausea or vomiting.       pantoprazole (PROTONIX) 40 MG tablet TAKE 1 TABLET(40 MG) BY MOUTH EVERY MORNING 90 tablet 3   warfarin (COUMADIN) 1 MG tablet Take 1 tablet (1 mg total) by mouth as directed. 120 tablet 2   warfarin (COUMADIN) 5 MG tablet TAKE 1 TABLET BY MOUTH EVERY DAY 90 tablet 1   No current facility-administered medications on file prior to visit.   Past Medical History:  Diagnosis Date   Anticoagulated on Coumadin    chronic--- managed by hematology/ oncology,   Asthma, mild intermittent    followed by pcp--- per pt last exacerbation winter 2021 w/ acute bronchitis   Bilateral leg cramps    Chronic constipation    CKD (chronic kidney disease), stage III ([place])    Closed bimalleolar fracture of left ankle 02/26/2020   DDD (degenerative disc disease), cervical    w/ spondylosis,  per pt last steroid injection 06/ 2022   DDD (degenerative disc disease), lumbosacral    DVT (deep venous thrombosis) ([place]) 07/30/2013   Dyspnea    occasionally   GERD (gastroesophageal reflux disease)    History of cardiac murmur as a child    History of DVT of lower extremity    left lower extremity in 1980s, fell when bowling   History of pulmonary embolus (PE) 1993   per pt left lung post op 2 wks cholecystectomy   History of rheumatic fever as a child    per last echo 12-31-2019 no valvular issues   History of TIA (transient ischemic attack)    2014 and 2018 or 2019,  per pt no residual   HTN (hypertension)    followed by pcp   Hyperlipidemia    IDA (iron deficiency anemia)    Macular degeneration of both eyes    Mild obstructive sleep apnea    per pt dx 2017 tried to uses cpap but intolerant   Mixed incontinence urge and stress    urologist--- dr Nila Nephew   OA (osteoarthritis) 07/06/2018   Osteoporosis    PAF (paroxysmal atrial fibrillation) ([place]) 10/01/2014   cardiologist--- dr Godfrey Pick tobb;   cardiac cath 02-18-2013 normal coronaries arteries, ef 50%, cath done since echo showed ef 30-35%; nucleat stress  study 04/ 2020 normal , normal echo 04/ 2021,  event monitor 09-14-2020 rare ST/AT variable block   PONV (postoperative nausea and vomiting)    Protein S deficiency ([place])    followed by hemotology/ oncology-- dr d. Bobby Rumpf ([facility]) dx 1980s;  prior DVT left lower leg 1980s and left lung PE 1993; chronic  coumadin since 1980s   PVC's (premature ventricular contractions)    followed by cardiology   S/P cardiac catheterization 02/2013   Normal coronaries; low normal EF  at 50%   Solitary pulmonary nodule on lung CT 02/06/2019   First noted 01/13/2014 > no change as of 12/21/2018   Spondylolisthesis, lumbar region 08/09/2018   Stroke ([place])    TIA in 2018 or 2019   Transient ischemic attack 07/30/2013   Type 2 diabetes mellitus treated with insulin ([place])    endocrilogist--- whitney reardon NP     (03-10-2021  pt continuously checks blood sugar throughout the day w/ Libre, fasting sugar --- 69--200)   Wears glasses    Wears hearing aid in both ears    Past Surgical History:  Procedure Laterality Date   ABDOMINAL HYSTERECTOMY     BLEPHAROPLASTY     CARDIAC CATHETERIZATION  02/18/2013   @ [place]  by Dr Martinique;  normal coronaries w/ preserved LVF, ef 50%;   previous cath 2001 normal ef 65%   CARPAL TUNNEL RELEASE Bilateral 1994   CATARACT EXTRACTION W/ INTRAOCULAR LENS IMPLANT Bilateral 2017   CHOLECYSTECTOMY, LAPAROSCOPIC  1993   COLONOSCOPY     EAR BIOPSY Left    FINGER SURGERY Left 2018   thumb   FOOT SURGERY     FOOT TENDON SURGERY Right    early 2000s   HAND SURGERY Right 04/2022   HARDWARE REMOVAL Left 03/12/2021   Procedure: HARDWARE REMOVAL;  Surgeon: Nicholes Stairs, MD;  Location[facility];  Service: Orthopedics;  Laterality: Left;  60 MINS   ORIF ANKLE FRACTURE Left 02/26/2020   Procedure: OPEN REDUCTION INTERNAL FIXATION (ORIF) ANKLE FRACTURE;  Surgeon: Nicholes Stairs, MD;  Location: [place];  Service: Orthopedics;  Laterality: Left;   90 mins   TONSILLECTOMY AND ADENOIDECTOMY Bilateral    TOTAL KNEE ARTHROPLASTY Left 03/2005   TOTAL VAGINAL HYSTERECTOMY  1988   per pt still has ovaries    Family History  Problem Relation Age of Onset   Cirrhosis Mother    Antithrombin III deficiency Mother        multiple emboli   Ulcerative colitis Mother    Diabetes Father    Coronary artery disease Father    Heart attack Father    Hypertension Father    Kidney disease Father    Hypertension Sister    Diabetes Sister    Heart attack Sister        age 54    Protein S deficiency Daughter    Heart attack Brother    Hypertension Brother    Lung cancer Brother    Stroke Neg Hx    Colitis Neg Hx    Colon polyps Neg Hx    Esophageal cancer Neg Hx    Liver cancer Neg Hx    Pancreatic cancer Neg Hx    Rectal cancer Neg Hx    Stomach cancer Neg Hx    Breast cancer Neg Hx    Social History   Socioeconomic History   Marital status: Married    Spouse name: [name]   Number of children: Not on file   Years of education: Not on file   Highest education level: Not on file  Occupational History   Not on file  Tobacco Use   Smoking status: Never   Smokeless tobacco: Never  Vaping Use   Vaping Use: Never used  Substance and Sexual Activity   Alcohol use: No   Drug use: Never   Sexual activity: Not on file  Other Topics Concern   Not on file  Social History Narrative   Lives with spouse in  [place].  2 grown daughters.   Retired Glass blower/designer     Social Determinants of [place] Strain: Low Risk  (05/25/2022)   Overall Financial Resource Strain (CARDIA)    Difficulty of Paying Living Expenses: Not hard at all  Food Insecurity: No Food Insecurity (09/30/2020)   Hunger Vital Sign    Worried About Running Out of Food in the Last Year: Never true    [place] in the Last Year: Never true  Transportation Needs: No Transportation Needs (03/23/2022)   PRAPARE - Radiographer, therapeutic (Medical): No    Lack of Transportation (Non-Medical): No  Physical Activity: Inactive (05/25/2022)   Exercise Vital Sign    Days of Exercise per Week: 0 days    Minutes of Exercise per Session: 0 min  Stress: Not on file  Social Connections: Not on file    Review of Systems  Constitutional:  Negative for chills, fatigue and fever.  HENT:  Negative for congestion, rhinorrhea and sore throat.   Respiratory:  Negative for cough and shortness of breath.   Cardiovascular:  Negative for chest pain.  Gastrointestinal:  Positive for abdominal pain (RLQ - radiates from right hip. Constant. Has been present since November. Denies constipation. Pt has seen Dr. Havery Moros. CT abd/pelvis was normal. no appendicitis.Marland Kitchen). Negative for constipation, diarrhea, nausea and vomiting.  Genitourinary:  Positive for difficulty urinating. Negative for dysuria and urgency.  Musculoskeletal:  Positive for arthralgias (right hip pain). Negative for back pain and myalgias.  Neurological:  Negative for dizziness, weakness, light-headedness and headaches.  Psychiatric/Behavioral:  Negative for dysphoric mood. The patient is not nervous/anxious.      Objective:  BP 136/60   Pulse 72   Temp (!) 96.6 F (35.9 C)   Resp 14   Ht 5' (1.524 m)   Wt 155 lb 12.8 oz (70.7 kg)   BMI 30.43 kg/m      09/07/2022    8:54 AM 08/11/2022    2:19 PM 08/09/2022    1:23 PM  BP/Weight  Systolic BP 637 858 850  Diastolic BP 60 86 70  Wt. (Lbs) 155.8 158 159.8  BMI 30.43 kg/m2 30.86 kg/m2 31.21 kg/m2    Physical Exam Vitals reviewed.  Constitutional:      Appearance: Normal appearance.  Neck:     Vascular: No carotid bruit.  Cardiovascular:     Rate and Rhythm: Normal rate and regular rhythm.     Heart sounds: Normal heart sounds.  Pulmonary:     Effort: Pulmonary effort is normal. No respiratory distress.     Breath sounds: Normal breath sounds.  Abdominal:     General: Abdomen is flat. Bowel sounds  are normal.     Palpations: Abdomen is soft.     Tenderness: There is abdominal tenderness (RLQ/RT Flank.).  Neurological:     Mental Status: She is alert and oriented to person, place, and time.  Psychiatric:        Mood and Affect: Mood normal.        Behavior: Behavior normal.     Diabetic Foot Exam - Simple   Simple Foot Form Diabetic Foot exam was performed with the following findings: Yes 09/07/2022  9:54 AM  Visual Inspection No deformities, no ulcerations, no other skin breakdown bilaterally: Yes Sensation Testing Intact to touch and monofilament testing bilaterally: Yes Pulse Check Posterior Tibialis and Dorsalis pulse intact bilaterally: Yes Comments  Lab Results  Component Value Date   WBC 7.3 08/04/2022   HGB 13.0 08/04/2022   HCT 39.0 08/04/2022   PLT 351 08/04/2022   GLUCOSE 168 (H) 08/04/2022   CHOL 143 05/02/2022   TRIG 86 05/02/2022   HDL 70 05/02/2022   LDLCALC 57 05/02/2022   ALT 25 08/04/2022   AST 29 08/04/2022   NA 134 (L) 08/04/2022   K 4.0 08/04/2022   CL 96 (L) 08/04/2022   CREATININE 1.44 (H) 08/04/2022   BUN 34 (H) 08/04/2022   CO2 24 08/04/2022   TSH 1.480 05/02/2022   INR 3.1 (H) 08/04/2022   HGBA1C 7.6 (A) 05/27/2022   MICROALBUR 10 06/15/2021      Assessment & Plan:   Problem List Items Addressed This Visit       Cardiovascular and Mediastinum   Atrial fibrillation [I48.91]    Rate controlled. On Coumadin.      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  No changes to medicines. Continue  Lisinopril 20 mg 2 daily, Amlodipine 5 mg daily, Aspirin 81 mg daily, Isosorbide 30 mg daily. Continue to work on eating a healthy diet and exercise.  Labs drawn today.          Digestive   GERD (gastroesophageal reflux disease)    Continue protonix 40 mg daily.          Endocrine   Type 2 diabetes mellitus with diabetic neuropathy, with long-term current use of insulin  (HCC) (Chronic)    Control: fair. Sees endocrinology.  Recommend check sugars before meals and before bed. Has CGM. Recommend check feet daily. Recommend annual eye exams. Medicines: defer to endocrine. Continue to work on eating a healthy diet and exercise.  Labs drawn today.         Relevant Orders   CBC with Differential/Platelet   Comprehensive metabolic panel   Hemoglobin A1c   Microalbumin / creatinine urine ratio     Genitourinary   Anemia in chronic kidney disease    Check cbc.  Management per specialist.        Hypertensive kidney disease with stage 3a chronic kidney disease ([place])    Well controlled.  No changes to medicines. Continue  Lisinopril 20 mg 2 daily, Amlodipine 5 mg daily,, and Isosorbide 30 mg daily. Continue to work on eating a healthy diet and exercise.  Labs drawn today.          Hematopoietic and Hemostatic   Acquired thrombophilia ([place]) - Primary    On coumadin. Managed by Dr. Bobby Rumpf.         Other   Mixed hyperlipidemia    Well controlled.  No changes to medicines. Continue atorvastatin 80 mg before bed.  Continue to work on eating a healthy diet and exercise.  Labs drawn today.        Relevant Orders   Lipid panel   Other Visit Diagnoses     Need for COVID-19 vaccine       Relevant Orders   [employer] Fall 2023 Covid-19 Vaccine 28yr and older (Completed)     .  No orders of the defined types were placed in this encounter.   Orders Placed This Encounter  Procedures   [place] Covid[place]19 Vacc[place] 134yrand older   CBC with Differential/Platelet   Comprehensive metabolic panel   Hemoglobin A1c   Lipid panel   Microalbumin / creatinine urine ratio    Total  time spent on today's visit was greater than 30 minutes, including both face-to-face time and nonface-to-face time personally spent on review of chart (labs and imaging), discussing labs and goals, discussing further work-up, treatment options, referrals to specialist  if needed, reviewing outside records of pertinent, answering patient's questions, and coordinating care.   Follow-up: Return in about 3 months (around 12/07/2022) for chronic fasting.  An After Visit Summary was printed and given to the patient.  [name], MD [facility] [phone]

## 2022-09-07 NOTE — Assessment & Plan Note (Signed)
Control: fair. Sees endocrinology.  Recommend check sugars before meals and before bed. Has CGM. Recommend check feet daily. Recommend annual eye exams. Medicines: defer to endocrine. Continue to work on eating a healthy diet and exercise.  Labs drawn today.

## 2022-09-07 NOTE — Assessment & Plan Note (Signed)
Continue protonix 40mg daily.  

## 2022-09-07 NOTE — Assessment & Plan Note (Signed)
Well controlled.  No changes to medicines. Continue atorvastatin 80 mg before bed.  Continue to work on eating a healthy diet and exercise.  Labs drawn today.

## 2022-09-07 NOTE — Assessment & Plan Note (Signed)
Rate controlled. On Coumadin.

## 2022-09-07 NOTE — Assessment & Plan Note (Signed)
Well controlled.  No changes to medicines. Continue  Lisinopril 20 mg 2 daily, Amlodipine 5 mg daily, Aspirin 81 mg daily, Isosorbide 30 mg daily. Continue to work on eating a healthy diet and exercise.  Labs drawn today.

## 2022-09-07 NOTE — Assessment & Plan Note (Signed)
Well controlled.  No changes to medicines. Continue  Lisinopril 20 mg 2 daily, Amlodipine 5 mg daily,, and Isosorbide 30 mg daily. Continue to work on eating a healthy diet and exercise.  Labs drawn today.

## 2022-09-07 NOTE — Patient Instructions (Signed)
Recommend shingles vaccines and RSV vaccine at the pharmacy.

## 2022-09-07 NOTE — Assessment & Plan Note (Addendum)
On coumadin. Managed by Dr. Bobby Rumpf.

## 2022-09-07 NOTE — Assessment & Plan Note (Signed)
Check cbc.  Management per specialist.

## 2022-09-08 LAB — COMPREHENSIVE METABOLIC PANEL
ALT: 24 IU/L (ref 0–32)
AST: 23 IU/L (ref 0–40)
Albumin/Globulin Ratio: 1.8 (ref 1.2–2.2)
Albumin: 4.2 g/dL (ref 3.8–4.8)
Alkaline Phosphatase: 139 IU/L — ABNORMAL HIGH (ref 44–121)
BUN/Creatinine Ratio: 27 (ref 12–28)
BUN: 31 mg/dL — ABNORMAL HIGH (ref 8–27)
Bilirubin Total: 0.4 mg/dL (ref 0.0–1.2)
CO2: 20 mmol/L (ref 20–29)
Calcium: 8.2 mg/dL — ABNORMAL LOW (ref 8.7–10.3)
Chloride: 100 mmol/L (ref 96–106)
Creatinine, Ser: 1.13 mg/dL — ABNORMAL HIGH (ref 0.57–1.00)
Globulin, Total: 2.3 g/dL (ref 1.5–4.5)
Glucose: 99 mg/dL (ref 70–99)
Potassium: 4.1 mmol/L (ref 3.5–5.2)
Sodium: 135 mmol/L (ref 134–144)
Total Protein: 6.5 g/dL (ref 6.0–8.5)
eGFR: 50 mL/min/{1.73_m2} — ABNORMAL LOW (ref >59)

## 2022-09-08 LAB — CBC WITH DIFFERENTIAL/PLATELET
Basophils Absolute: 0.1 10*3/uL (ref 0.0–0.2)
Basos: 1 %
EOS (ABSOLUTE): 0.1 10*3/uL (ref 0.0–0.4)
Eos: 1 %
Hematocrit: 39.2 % (ref 34.0–46.6)
Hemoglobin: 12.7 g/dL (ref 11.1–15.9)
Immature Grans (Abs): 0 10*3/uL (ref 0.0–0.1)
Immature Granulocytes: 0 %
Lymphocytes Absolute: 1.9 10*3/uL (ref 0.7–3.1)
Lymphs: 20 %
MCH: 29.8 pg (ref 26.6–33.0)
MCHC: 32.4 g/dL (ref 31.5–35.7)
MCV: 92 fL (ref 79–97)
Monocytes Absolute: 1.1 10*3/uL — ABNORMAL HIGH (ref 0.1–0.9)
Monocytes: 11 %
Neutrophils Absolute: 6.1 10*3/uL (ref 1.4–7.0)
Neutrophils: 67 %
Platelets: 398 10*3/uL (ref 150–450)
RBC: 4.26 x10E6/uL (ref 3.77–5.28)
RDW: 13.2 % (ref 11.7–15.4)
WBC: 9.2 10*3/uL (ref 3.4–10.8)

## 2022-09-08 LAB — HEMOGLOBIN A1C
Est. average glucose Bld gHb Est-mCnc: 174 mg/dL
Hgb A1c MFr Bld: 7.7 % — ABNORMAL HIGH (ref 4.8–5.6)

## 2022-09-08 LAB — CARDIOVASCULAR RISK ASSESSMENT

## 2022-09-08 LAB — LIPID PANEL
Chol/HDL Ratio: 1.8 ratio (ref 0.0–4.4)
Cholesterol, Total: 138 mg/dL (ref 100–199)
HDL: 76 mg/dL (ref >39)
LDL Chol Calc (NIH): 49 mg/dL (ref 0–99)
Triglycerides: 60 mg/dL (ref 0–149)
VLDL Cholesterol Cal: 13 mg/dL (ref 5–40)

## 2022-09-08 LAB — MICROALBUMIN / CREATININE URINE RATIO
Creatinine, Urine: 46.2 mg/dL
Microalb/Creat Ratio: 35 mg/g creat — ABNORMAL HIGH (ref 0–29)
Microalbumin, Urine: 16 ug/mL

## 2022-09-08 NOTE — Progress Notes (Signed)
Blood count normal.  Liver function normal.  Kidney function abnormal. Improved from one month ago.   Thyroid function normal.  Cholesterol: good HBA1C: 7.7. Not at goal, but stable. Forward to endocrinology for her upcoming appt.  Spilling protein in urine.  Recommend refer to nephrology.

## 2022-09-12 ENCOUNTER — Other Ambulatory Visit: Payer: Self-pay

## 2022-09-12 ENCOUNTER — Telehealth: Payer: Self-pay

## 2022-09-12 DIAGNOSIS — R809 Proteinuria, unspecified: Secondary | ICD-10-CM

## 2022-09-12 NOTE — Progress Notes (Signed)
Care Management & Coordination Services Pharmacy Team  Reason for Encounter: Diabetes  Contacted patient on 09/12/2022 to discuss diabetes disease state.   Recent office visits:  09/07/22 [name] MD. Seen for routine visit. D/C Enoxaparin, Ondansetron, Oxycodone and Trazodone.   09/01/22 Rhae Hammock LPN. Seen for Prolia '60mg'$  Injection.   Recent consult visits:  08/11/22 (Gastroenterology) W[place]go Cellar MD. Seen for abdominal pain. No med changes.   Hospital visits:  None  Medications: Outpatient Encounter Medications as of 09/12/2022  Medication Sig   albuterol (VENTOLIN HFA) 108 (90 Base) MCG/ACT inhaler INHALE 1 TO 2 PUFFS INTO THE LUNGS EVERY 6 HOURS AS NEEDED FOR WHEEZING OR SHORTNESS OF BREATH   amLODipine (NORVASC) 5 MG tablet TAKE 1 TABLET(5 MG) BY MOUTH DAILY   aspirin EC (ASPIRIN LOW DOSE) 81 MG tablet TAKE 1 TABLET(81 MG) BY MOUTH DAILY   atorvastatin (LIPITOR) 80 MG tablet TAKE 1 TABLET(80 MG) BY MOUTH DAILY   azelastine (ASTELIN) 0.1 % nasal spray Place 2 sprays into both nostrils daily as needed for rhinitis or allergies.   Calcium Citrate-Vitamin D (CALCIUM CITRATE + PO) Take by mouth.   Cholecalciferol (VITAMIN D3) 50 MCG (2000 UT) TABS Take 4,000 Units by mouth daily with lunch.   Continuous Blood Gluc Sensor (FREESTYLE LIBRE 2 SENSOR) MISC 2 each by Does not apply route every 14 (fourteen) days. E11.40   dapagliflozin propanediol (FARXIGA) 10 MG TABS tablet Take 1 tablet (10 mg total) by mouth daily before breakfast.   denosumab (PROLIA) 60 MG/ML SOSY injection    estradiol (ESTRACE) 0.1 MG/GM vaginal cream Place 1 Applicatorful vaginally 2 (two) times a week. Uses twice weekly   ferrous sulfate 325 (65 FE) MG tablet Take 325 mg by mouth daily with lunch.   furosemide (LASIX) 40 MG tablet Take 1 tablet (40 mg total) by mouth daily.   GEMTESA 75 MG TABS Take by mouth.   Insulin Aspart FlexPen (NOVOLOG) 100 UNIT/ML Max daily 30 units   insulin glargine, 1  Unit Dial, (TOUJEO) 300 UNIT/ML Solostar Pen Inject 8 Units into the skin at bedtime.   Insulin Pen Needle (BD PEN NEEDLE NANO 2ND GEN) 32G X 4 MM MISC USE DAILY AS DIRECTED   ipratropium-albuterol (DUONEB) 0.5-2.5 (3) MG/3ML SOLN Take 3 mLs by nebulization 4 (four) times daily as needed (wheezing/shortness of breath).   isosorbide mononitrate (IMDUR) 30 MG 24 hr tablet 1 tablet by mouth daily, 2nd attempt, please reach arrange appt for further refills   lisinopril (ZESTRIL) 20 MG tablet TAKE 2 TABLETS(40 MG) BY MOUTH TWICE DAILY   magnesium oxide (MAG-OX) 400 (240 Mg) MG tablet TAKE 2 TABLETS(800 MG) BY MOUTH TWICE DAILY (Patient taking differently: Take 800 mg by mouth 2 (two) times daily.)   Niacin (VITAMIN B-3 PO) Take by mouth.   nystatin cream (MYCOSTATIN) APPLY TO AREA OF RED IRRITATED RASH 3 TO 4 TIMES A DAY FOR SKIN YEAST INFECTION   ondansetron (ZOFRAN-ODT) 8 MG disintegrating tablet Take 8 mg by mouth every 8 (eight) hours as needed for nausea or vomiting.    pantoprazole (PROTONIX) 40 MG tablet TAKE 1 TABLET(40 MG) BY MOUTH EVERY MORNING   warfarin (COUMADIN) 1 MG tablet Take 1 tablet (1 mg total) by mouth as directed.   warfarin (COUMADIN) 5 MG tablet TAKE 1 TABLET BY MOUTH EVERY DAY   No facility-administered encounter medications on file as of 09/12/2022.    Recent Relevant Labs: Lab Results  Component Value Date/Time   HGBA1C 7.7 (H)  09/07/2022 10:00 AM   HGBA1C 7.6 (A) 05/27/2022 11:45 AM   HGBA1C 7.2 (H) 01/24/2022 09:05 AM   MICROALBUR 10 06/15/2021 04:46 PM   MICROALBUR 10 07/29/2020 12:01 PM    Kidney Function Lab Results  Component Value Date/Time   CREATININE 1.13 (H) 09/07/2022 10:00 AM   CREATININE 1.44 (H) 08/04/2022 06:08 PM   GFR 57.98 (L) 03/10/2020 04:46 PM   GFRNONAA 37 (L) 08/04/2022 06:08 PM   GFRAA 70 09/11/2020 11:23 AM    Current antihyperglycemic regimen:  Wilder Glade '10mg'$  daily  Novolog 100 units Max 30 daily (Take 4 units before each meal)   Toujeo 300units- 8 units at bedtime Patient verbally confirms she is taking the above medications as directed. Yes  What diet changes have been made to improve diabetes control?  What recent interventions/DTPs have been made to improve glycemic control:  No changes   Have there been any recent hospitalizations or ED visits since last visit with PharmD? No  Patient denies hypoglycemic symptoms, including None  Patient reports hyperglycemic symptoms, including weakness  How often are you checking your blood sugar? 3-4 times daily  What are your blood sugars ranging?  Fasting: 09/12/22 163, 1/7/24161, 09/10/22 137, 09/09/22 100, 09/08/22 130, 09/07/22 134 Before Meal: 09/11/22 273, 09/06/22 241 After Meal 09/10/22 294, 09/08/22 119, 325  During the week, how often does your blood glucose drop below 70? Never  Are you checking your feet daily/regularly? Yes  Adherence Review: Is the patient currently on a STATIN medication? Yes Is the patient currently on ACE/ARB medication? Yes Does the patient have >5 day gap between last estimated fill dates? No  Star Rating Drugs:  Medication:  Last Fill: Day Supply Farxiga                        09/02/22-06/01/22 90ds    Care Gaps: Annual wellness visit in last year? Yes Last eye exam / retinopathy screening:10/21/21 Last diabetic foot exam:09/07/22   Pamela [name]

## 2022-09-13 ENCOUNTER — Telehealth: Payer: Self-pay | Admitting: Hematology and Oncology

## 2022-09-13 NOTE — Telephone Encounter (Signed)
Spoke with patient regarding home INR 1.3. She denies missed doses or new medications. Previously her INR was supratherapeutic on '6mg'$  M,F and '5mg'$  rest of days, so advised her to take '5mg'$  daily and repeat in 1 week. She expressed understanding.

## 2022-09-13 NOTE — Telephone Encounter (Signed)
-----   Message from Pamela November, LPN sent at 02/09/4254  9:24 AM EST ----- Patient called and reported an INR of 1.3 .

## 2022-09-14 DIAGNOSIS — M25551 Pain in right hip: Secondary | ICD-10-CM | POA: Diagnosis not present

## 2022-09-19 ENCOUNTER — Telehealth: Payer: Self-pay

## 2022-09-19 NOTE — Telephone Encounter (Signed)
Pt called to report her INR is 1.2 this morning. She started the Lovenox injections on Thursday (& held warfarin), as she has tooth extraction this Thursday. Pt knows to continue Lovenox daily & restart Warfarin on Friday until INR therapeutic.

## 2022-09-20 ENCOUNTER — Other Ambulatory Visit: Payer: Self-pay | Admitting: Oncology

## 2022-09-20 ENCOUNTER — Other Ambulatory Visit: Payer: Self-pay | Admitting: Family Medicine

## 2022-09-23 ENCOUNTER — Encounter: Payer: Self-pay | Admitting: Hematology and Oncology

## 2022-09-23 LAB — PROTIME-INR

## 2022-09-23 NOTE — Telephone Encounter (Cosign Needed)
Messaged the provider from the office with sugar readings on 09/23/22   Elray Mcgregor, [place] Pharmacist Assistant  [phone]

## 2022-09-26 ENCOUNTER — Telehealth: Payer: Self-pay

## 2022-09-26 NOTE — Telephone Encounter (Signed)
Pt called to give INR result of 1.3 this morning. She is taking warfarin '5mg'$  po qd and Lovenox BID. "I'm hoping she tells me I can stop the Lovenox. I'm getting knots all over my belly".

## 2022-09-28 DIAGNOSIS — M25551 Pain in right hip: Secondary | ICD-10-CM | POA: Diagnosis not present

## 2022-09-28 DIAGNOSIS — M5416 Radiculopathy, lumbar region: Secondary | ICD-10-CM | POA: Diagnosis not present

## 2022-09-28 DIAGNOSIS — M5451 Vertebrogenic low back pain: Secondary | ICD-10-CM | POA: Diagnosis not present

## 2022-09-28 DIAGNOSIS — M5459 Other low back pain: Secondary | ICD-10-CM | POA: Diagnosis not present

## 2022-09-29 ENCOUNTER — Telehealth: Payer: Self-pay

## 2022-09-29 NOTE — Telephone Encounter (Signed)
Pt called, excited that her INR is 2.3 this morning.

## 2022-10-03 ENCOUNTER — Telehealth: Payer: Self-pay

## 2022-10-03 NOTE — Telephone Encounter (Signed)
10/03/22 - Pt called to report her INR is 3.3 today. Pt has been taking warfarin '6mg'$  po qd the last week per instruction form Kelli Mosher,PA. I notified Dr Bobby Rumpf. He recommends she start back on warfarin '5mg'$  qd and retest in couple of weeks.

## 2022-10-04 ENCOUNTER — Encounter: Payer: Self-pay | Admitting: Hematology and Oncology

## 2022-10-05 DIAGNOSIS — M5451 Vertebrogenic low back pain: Secondary | ICD-10-CM | POA: Diagnosis not present

## 2022-10-10 ENCOUNTER — Telehealth: Payer: Self-pay

## 2022-10-10 DIAGNOSIS — D6859 Other primary thrombophilia: Secondary | ICD-10-CM | POA: Diagnosis not present

## 2022-10-10 DIAGNOSIS — Z7901 Long term (current) use of anticoagulants: Secondary | ICD-10-CM | POA: Diagnosis not present

## 2022-10-10 NOTE — Telephone Encounter (Signed)
INR 2.7 this am.

## 2022-10-11 ENCOUNTER — Other Ambulatory Visit: Payer: Self-pay

## 2022-10-11 DIAGNOSIS — Z01810 Encounter for preprocedural cardiovascular examination: Secondary | ICD-10-CM

## 2022-10-12 DIAGNOSIS — N3946 Mixed incontinence: Secondary | ICD-10-CM | POA: Diagnosis not present

## 2022-10-12 DIAGNOSIS — Z01818 Encounter for other preprocedural examination: Secondary | ICD-10-CM | POA: Diagnosis not present

## 2022-10-12 DIAGNOSIS — N952 Postmenopausal atrophic vaginitis: Secondary | ICD-10-CM | POA: Diagnosis not present

## 2022-10-12 DIAGNOSIS — B3731 Acute candidiasis of vulva and vagina: Secondary | ICD-10-CM | POA: Diagnosis not present

## 2022-10-12 DIAGNOSIS — Z01810 Encounter for preprocedural cardiovascular examination: Secondary | ICD-10-CM | POA: Diagnosis not present

## 2022-10-12 DIAGNOSIS — N390 Urinary tract infection, site not specified: Secondary | ICD-10-CM | POA: Diagnosis not present

## 2022-10-13 ENCOUNTER — Telehealth: Payer: Self-pay

## 2022-10-13 ENCOUNTER — Other Ambulatory Visit: Payer: Self-pay

## 2022-10-13 DIAGNOSIS — Z01810 Encounter for preprocedural cardiovascular examination: Secondary | ICD-10-CM

## 2022-10-13 NOTE — Progress Notes (Unsigned)
Care Management & Coordination Services Pharmacy Team  Reason for Encounter: Diabetes  Contacted patient to discuss diabetes disease state. {US HC Outreach:28874}  Current antihyperglycemic regimen:  Farxiga 54m daily  Novolog 100 units Max 30 daily (Take 4 units before each meal)  Toujeo 300units- 8 units at bedtime   Patient verbally confirms she is taking the above medications as directed. {yes/no:20286}  What diet changes have been made to improve diabetes control?  What recent interventions/DTPs have been made to improve glycemic control:  ***  Have there been any recent hospitalizations or ED visits since last visit with PharmD? {yes/no:20286}  Patient {reports/denies:24182} hypoglycemic symptoms, including {Hypoglycemic Symptoms:3049003}  Patient {reports/denies:24182} hyperglycemic symptoms, including {symptoms; hyperglycemia:17903}  How often are you checking your blood sugar? {BG Testing frequency:23922}  What are your blood sugars ranging?  Fasting: *** Before meals: *** After meals: *** Bedtime: ***  During the week, how often does your blood glucose drop below 70? {LowBGfrequency:24142}  Are you checking your feet daily/regularly? {yes/no:20286}  Adherence Review: Is the patient currently on a STATIN medication? Yes Is the patient currently on ACE/ARB medication? Yes Does the patient have >5 day gap between last estimated fill dates? No   Chart Updates: Recent office visits:  None  Recent consult visits:  09-14-2022 MParalee Cancel MD ([facility]). Follow up visit  Hospital visits:  Medication Reconciliation was completed by comparing discharge summary, patient's EMR and Pharmacy list, and upon discussion with patient.  Admitted to the hospital on 08-04-2022 due to Lower abdominal. Discharge date was 08-05-2022. [facility]  Medication Changes at  Hospital Discharge: None  Medications Discontinued at Hospital Discharge: None  Medications that remain the same after Hospital Discharge:??  -All other medications will remain the same.    Medications: Outpatient Encounter Medications as of 10/13/2022  Medication Sig   albuterol (VENTOLIN HFA) 108 (90 Base) MCG/ACT inhaler INHALE 1 TO 2 PUFFS INTO THE LUNGS EVERY 6 HOURS AS NEEDED FOR WHEEZING OR SHORTNESS OF BREATH   amLODipine (NORVASC) 5 MG tablet TAKE 1 TABLET(5 MG) BY MOUTH DAILY   aspirin EC (ASPIRIN LOW DOSE) 81 MG tablet TAKE 1 TABLET(81 MG) BY MOUTH DAILY   atorvastatin (LIPITOR) 80 MG tablet TAKE 1 TABLET(80 MG) BY MOUTH DAILY   azelastine (ASTELIN) 0.1 % nasal spray Place 2 sprays into both nostrils daily as needed for rhinitis or allergies.   Calcium Citrate-Vitamin D (CALCIUM CITRATE + PO) Take by mouth.   Cholecalciferol (VITAMIN D3) 50 MCG (2000 UT) TABS Take 4,000 Units by mouth daily with lunch.   Continuous Blood Gluc Sensor (FREESTYLE LIBRE 2 SENSOR) MISC 2 each by Does not apply route every 14 (fourteen) days. E11.40   dapagliflozin propanediol (FARXIGA) 10 MG TABS tablet Take 1 tablet (10 mg total) by mouth daily before breakfast.   denosumab (PROLIA) 60 MG/ML SOSY injection    estradiol (ESTRACE) 0.1 MG/GM vaginal cream Place 1 Applicatorful vaginally 2 (two) times a week. Uses twice weekly   ferrous sulfate 325 (65 FE) MG tablet Take 325 mg by mouth daily with lunch.   furosemide (LASIX) 40 MG tablet Take 1 tablet (40 mg total) by mouth daily.   GEMTESA 75 MG TABS Take by mouth.   Insulin Aspart FlexPen (NOVOLOG) 100 UNIT/ML Max daily 30 units   insulin glargine, 1 Unit Dial, (TOUJEO) 300 UNIT/ML Solostar Pen Inject 8 Units into the skin at bedtime.  Insulin Pen Needle (BD PEN NEEDLE NANO 2ND GEN) 32G X 4 MM MISC USE DAILY AS DIRECTED   ipratropium-albuterol (DUONEB) 0.5-2.5 (3) MG/3ML SOLN Take 3 mLs by nebulization 4 (four) times daily as needed  (wheezing/shortness of breath).   isosorbide mononitrate (IMDUR) 30 MG 24 hr tablet 1 tablet by mouth daily, 2nd attempt, please reach arrange appt for further refills   lisinopril (ZESTRIL) 20 MG tablet TAKE 2 TABLETS(40 MG) BY MOUTH TWICE DAILY   magnesium oxide (MAG-OX) 400 (240 Mg) MG tablet TAKE 2 TABLETS(800 MG) BY MOUTH TWICE DAILY (Patient taking differently: Take 800 mg by mouth 2 (two) times daily.)   Niacin (VITAMIN B-3 PO) Take by mouth.   nystatin cream (MYCOSTATIN) APPLY TO AREA OF RED IRRITATED RASH 3 TO 4 TIMES A DAY FOR SKIN YEAST INFECTION   ondansetron (ZOFRAN-ODT) 8 MG disintegrating tablet Take 8 mg by mouth every 8 (eight) hours as needed for nausea or vomiting.    pantoprazole (PROTONIX) 40 MG tablet TAKE 1 TABLET(40 MG) BY MOUTH EVERY MORNING   warfarin (COUMADIN) 1 MG tablet Take 1 tablet (1 mg total) by mouth as directed.   warfarin (COUMADIN) 5 MG tablet TAKE 1 TABLET BY MOUTH EVERY DAY   No facility-administered encounter medications on file as of 10/13/2022.    Recent Relevant Labs: Lab Results  Component Value Date/Time   HGBA1C 7.7 (H) 09/07/2022 10:00 AM   HGBA1C 7.6 (A) 05/27/2022 11:45 AM   HGBA1C 7.2 (H) 01/24/2022 09:05 AM   MICROALBUR 10 06/15/2021 04:46 PM   MICROALBUR 10 07/29/2020 12:01 PM    Kidney Function Lab Results  Component Value Date/Time   CREATININE 1.13 (H) 09/07/2022 10:00 AM   CREATININE 1.44 (H) 08/04/2022 06:08 PM   GFR 57.98 (L) 03/10/2020 04:46 PM   GFRNONAA 37 (L) 08/04/2022 06:08 PM   GFRAA 70 09/11/2020 11:23 AM   10-13-2022: 1st attempt left VM 10-17-2022: 2nd attempt left VM  Star Rating Drugs:  Farxiga 10 mg- Last filled 09-02-2022 90 DS. Previous 06-01-2022 90 DS Lisinopril 20 mg- Last filled 07-15-2022 90 DS. Previous 04-25-2022 90 DS Atorvastatin 80 mg- Last filled 07-15-2022 90 DS. Previous 04-28-2022 90 DS  Care Gaps: Annual wellness visit in last year? Yes Last eye exam / retinopathy screening:  10-21-2021 Last diabetic foot exam:   Pamela [name] [place] Pharmacist Assistant [phone]

## 2022-10-17 ENCOUNTER — Telehealth: Payer: Self-pay

## 2022-10-17 NOTE — Telephone Encounter (Addendum)
Pt called to report her INR is 3.0 this morning. She is taking coumadin 16m po qd. Dr LBobby Rumpfnotified, no new orders. Pt to maintain current dosing.

## 2022-10-17 NOTE — Telephone Encounter (Signed)
Pamela [name] left a message questioning her phone calls from [facility].  She was informed that [name] works with the pharmacy and it is safe for her to talk with her.

## 2022-10-19 DIAGNOSIS — E1165 Type 2 diabetes mellitus with hyperglycemia: Secondary | ICD-10-CM | POA: Diagnosis not present

## 2022-10-19 DIAGNOSIS — M1611 Unilateral primary osteoarthritis, right hip: Secondary | ICD-10-CM | POA: Diagnosis not present

## 2022-10-19 DIAGNOSIS — E1122 Type 2 diabetes mellitus with diabetic chronic kidney disease: Secondary | ICD-10-CM | POA: Diagnosis not present

## 2022-10-20 ENCOUNTER — Telehealth: Payer: Self-pay

## 2022-10-20 NOTE — Telephone Encounter (Signed)
The patient walked into clinic today with report MRI hip that revealed a 4 by 2.5 cm mass in the right inguinal area consistent with nonacute hematoma.  There was an anteriosuperior labral tear and partial longitudinal abductor tendon tears with mild bursitis.  The patient has not had ultrasound of the right lower extremity.  She states her orthopedist recommended steroid injections or hip replacement.  She has had steroid injections without much improvement.  She is seeing another orthopedic surgeon later this month for second opinion.  She has difficulty  ambulating due to her hip pain.  Her home INR was 3 on February 12. On exam, there is a pea-sized, nontender nodule in the right lateral distal lower extremity, not calf.  There is no calf pain or tenderness.  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 This may represent a superficial thrombosis or even an varicosity, so this is not concerning.  She knows to contact our office if this increases or she develops other concerns.  She will continue home INR weekly.

## 2022-10-20 NOTE — Telephone Encounter (Signed)
Pt called with concerns. She went to orthopedic surgeon yesterday. "They told me I have a pea sized blood clot in the calf of my right leg. Also, an MRI of my right hip showed an area of 92m x 262m@ right inguinal superficial subcutaneous thigh that suggests non acute hematoma. Is this something I should worry about? If so, who do I see"? Pt continues taking coumadin 66m1mo qd.  No falls.

## 2022-10-25 ENCOUNTER — Telehealth: Payer: Self-pay

## 2022-10-25 NOTE — Telephone Encounter (Signed)
Pt called to report her INR was 3.0 this morning. She was happy with result.

## 2022-10-27 ENCOUNTER — Telehealth: Payer: Self-pay | Admitting: *Deleted

## 2022-10-27 ENCOUNTER — Telehealth: Payer: Self-pay

## 2022-10-27 NOTE — Telephone Encounter (Addendum)
   Pre-operative Risk Assessment    Patient Name: Pamela [name]  DOB: 06/18/1946 MRN: [id number]      Request for Surgical Clearance    Procedure:   Lumbar Decompression  Date of Surgery:  Clearance TBD                                 Surgeon:  Melina Schools MD  Surgeon's Group or Practice Name:  [facility] Phone number:  [phone] Fax number:  [phone]   Type of Clearance Requested:   - Medical  - Pharmacy:  Hold Aspirin and Warfarin (Coumadin)     Type of Anesthesia:  Not Indicated   Additional requests/questions:  Please advise surgeon/provider what medications should be held.  Riki Sheer   10/27/2022, 3:29 PM

## 2022-10-27 NOTE — Telephone Encounter (Signed)
Pt has been scheduled a tele visit, 11/04/22 9:00.  Consent on file / medications reconciled.    Patient Consent for Virtual Visit        Pamela [name] has provided verbal consent on 10/27/2022 for a virtual visit (video or telephone).   CONSENT FOR VIRTUAL VISIT FOR:  Pamela [name]  By participating in this virtual visit I agree to the following:  I hereby voluntarily request, consent and authorize [facility] and its employed or contracted physicians, physician assistants, nurse practitioners or other licensed health care professionals (the Practitioner), to provide me with telemedicine health care services (the "Services") as deemed necessary by the treating Practitioner. I acknowledge and consent to receive the Services by the Practitioner via telemedicine. I understand that the telemedicine visit will involve communicating with the Practitioner through live audiovisual communication technology and the disclosure of certain medical information by electronic transmission. I acknowledge that I have been given the opportunity to request an in-person assessment or other available alternative prior to the telemedicine visit and am voluntarily participating in the telemedicine visit.  I understand that I have the right to withhold or withdraw my consent to the use of telemedicine in the course of my care at any time, without affecting my right to future care or treatment, and that the Practitioner or I may terminate the telemedicine visit at any time. I understand that I have the right to inspect all information obtained and/or recorded in the course of the telemedicine visit and may receive copies of available information for a reasonable fee.  I understand that some of the potential risks of receiving the Services via telemedicine include:  Delay or interruption in medical evaluation due to technological equipment failure or disruption; Information transmitted  may not be sufficient (e.g. poor resolution of images) to allow for appropriate medical decision making by the Practitioner; and/or  In rare instances, security protocols could fail, causing a breach of personal health information.  Furthermore, I acknowledge that it is my responsibility to provide information about my medical history, conditions and care that is complete and accurate to the best of my ability. I acknowledge that Practitioner's advice, recommendations, and/or decision may be based on factors not within their control, such as incomplete or inaccurate data provided by me or distortions of diagnostic images or specimens that may result from electronic transmissions. I understand that the practice of medicine is not an exact science and that Practitioner makes no warranties or guarantees regarding treatment outcomes. I acknowledge that a copy of this consent can be made available to me via my patient portal ([facility]), or I can request a printed copy by calling the office of [facility].    I understand that my insurance will be billed for this visit.   I have read or had this consent read to me. I understand the contents of this consent, which adequately explains the benefits and risks of the Services being provided via telemedicine.  I have been provided ample opportunity to ask questions regarding this consent and the Services and have had my questions answered to my satisfaction. I give my informed consent for the services to be provided through the use of telemedicine in my medical care

## 2022-10-27 NOTE — Telephone Encounter (Signed)
Pt has been scheduled a tele visit, 11/04/22 9:00.  Consent on file / medications reconciled.

## 2022-10-27 NOTE — Telephone Encounter (Signed)
   Name: Pamela [name]  DOB: February 27, 1946  MRN: [id number]  Primary Cardiologist: Berniece Salines, DO   Preoperative team, please contact this patient and set up a phone call appointment for further preoperative risk assessment. Please obtain consent and complete medication review. Thank you for your help.  I confirm that guidance regarding antiplatelet and oral anticoagulation therapy has been completed and, if necessary, noted below.  Per office protocol, if patient is without any new symptoms or concerns at the time of their virtual visit, she may hold Aspirin for 5-7 days prior to procedure. Please resume Aspirin as soon as possible postprocedure, at the discretion of the surgeon.   Cardiology does not manage pt's warfarin. Recommendations for holding warfarin prior to surgery should come from managing provider.   Lenna Sciara, NP 10/27/2022, 3:54 PM [place]

## 2022-10-28 DIAGNOSIS — Z6828 Body mass index (BMI) 28.0-28.9, adult: Secondary | ICD-10-CM | POA: Diagnosis not present

## 2022-10-28 DIAGNOSIS — H353132 Nonexudative age-related macular degeneration, bilateral, intermediate dry stage: Secondary | ICD-10-CM | POA: Diagnosis not present

## 2022-10-28 DIAGNOSIS — M48062 Spinal stenosis, lumbar region with neurogenic claudication: Secondary | ICD-10-CM | POA: Diagnosis not present

## 2022-10-28 DIAGNOSIS — S32010A Wedge compression fracture of first lumbar vertebra, initial encounter for closed fracture: Secondary | ICD-10-CM | POA: Diagnosis not present

## 2022-11-01 DIAGNOSIS — M13841 Other specified arthritis, right hand: Secondary | ICD-10-CM | POA: Diagnosis not present

## 2022-11-04 ENCOUNTER — Encounter: Payer: Self-pay | Admitting: Hematology and Oncology

## 2022-11-04 ENCOUNTER — Telehealth: Payer: Self-pay | Admitting: *Deleted

## 2022-11-04 ENCOUNTER — Ambulatory Visit: Payer: Medicare PPO | Attending: Cardiovascular Disease

## 2022-11-04 DIAGNOSIS — Z01810 Encounter for preprocedural cardiovascular examination: Secondary | ICD-10-CM

## 2022-11-04 NOTE — Progress Notes (Signed)
Virtual Visit via Telephone Note   Because of Pamela [name]'s co-morbid illnesses, she is at least at moderate risk for complications without adequate follow up.  This format is felt to be most appropriate for this patient at this time.  The patient did not have access to video technology/had technical difficulties with video requiring transitioning to audio format only (telephone).  All issues noted in this document were discussed and addressed.  No physical exam could be performed with this format.  Please refer to the patient's chart for her consent to telehealth for [facility].  Evaluation Performed:  Preoperative cardiovascular risk assessment _____________   Date:  11/04/2022   Patient ID:  Pamela [name], DOB 05-11-1946, MRN [id number] Patient Location:  Home Provider location:   Office  Primary Care Provider:  [name], MD Primary Cardiologist:  Berniece Salines, DO  Chief Complaint / Patient Profile   77 y.o. y/o female with a h/o diabetes mellitus, GERD, protein S deficiency chronic anticoagulation with Coumadin, previous DVT, hypertension, hypercholesterolemia, PVC, paroxysmal atrial fibrillation history of left heart catheterization with normal coronaries, history of TIA who is pending lumbar decompression and presents today for telephonic preoperative cardiovascular risk assessment.  History of Present Illness    Pamela [name] is a 77 y.o. female who presents via audio/video conferencing for a telehealth visit today.  Pt was last seen in cardiology clinic on 07/15/2022 by Dr. Harriet Masson.  At that time Pamela [name] was doing well .  The patient is now pending procedure as outlined above. Since her last visit, she has been doing very well and feeling okay.  Denies chest pain or shortness of breath.  She states that it is difficult to walk 1-2 blocks due to her back.  No issues with her heart.  She enjoys going out to  eat and visiting with friends.  She also does her own grocery shopping.  Recently, she switched surgeons and is now having Dr. Ellene Route do her surgery.  She has scored above the 4 METS minimum requirement today.  Cardiology does not manage her Coumadin.  Recommendations for holding Coumadin prior to surgery should come from with the managing provider.  Per office protocol, patient can hold her aspirin for 5 to 7 days prior to the procedure.  Please resume when medically safe to do so.  Past Medical History    Past Medical History:  Diagnosis Date   Anticoagulated on Coumadin    chronic--- managed by hematology/ oncology,   Asthma, mild intermittent    followed by pcp--- per pt last exacerbation winter 2021 w/ acute bronchitis   Bilateral leg cramps    Chronic constipation    CKD (chronic kidney disease), stage III ([place])    Closed bimalleolar fracture of left ankle 02/26/2020   DDD (degenerative disc disease), cervical    w/ spondylosis,  per pt last steroid injection 06/ 2022   DDD (degenerative disc disease), lumbosacral    DVT (deep venous thrombosis) ([place]) 07/30/2013   Dyspnea    occasionally   GERD (gastroesophageal reflux disease)    History of cardiac murmur as a child    History of DVT of lower extremity    left lower extremity in 1980s, fell when bowling   History of pulmonary embolus (PE) 1993   per pt left lung post op 2 wks cholecystectomy   History of rheumatic fever as a child    per last echo 12-31-2019 no valvular  issues   History of TIA (transient ischemic attack)    2014 and 2018 or 2019,  per pt no residual   HTN (hypertension)    followed by pcp   Hyperlipidemia    IDA (iron deficiency anemia)    Macular degeneration of both eyes    Mild obstructive sleep apnea    per pt dx 2017 tried to uses cpap but intolerant   Mixed incontinence urge and stress    urologist--- dr Nila Nephew   OA (osteoarthritis) 07/06/2018   Osteoporosis    PAF (paroxysmal atrial  fibrillation) ([place]) 10/01/2014   cardiologist--- dr Godfrey Pick tobb;   cardiac cath 02-18-2013 normal coronaries arteries, ef 50%, cath done since echo showed ef 30-35%; nucleat stress study 04/ 2020 normal , normal echo 04/ 2021,  event monitor 09-14-2020 rare ST/AT variable block   PONV (postoperative nausea and vomiting)    Protein S deficiency ([place])    followed by hemotology/ oncology-- dr d. Bobby Rumpf ([facility]) dx 1980s;  prior DVT left lower leg 1980s and left lung PE 1993; chronic  coumadin since 1980s   PVC's (premature ventricular contractions)    followed by cardiology   S/P cardiac catheterization 02/2013   Normal coronaries; low normal EF at 50%   Solitary pulmonary nodule on lung CT 02/06/2019   First noted 01/13/2014 > no change as of 12/21/2018   Spondylolisthesis, lumbar region 08/09/2018   Stroke ([place])    TIA in 2018 or 2019   Transient ischemic attack 07/30/2013   Type 2 diabetes mellitus treated with insulin ([place])    endocrilogist--- whitney reardon NP     (03-10-2021  pt continuously checks blood sugar throughout the day w/ Libre, fasting sugar --- 69--200)   Wears glasses    Wears hearing aid in both ears    Past Surgical History:  Procedure Laterality Date   ABDOMINAL HYSTERECTOMY     BLEPHAROPLASTY     CARDIAC CATHETERIZATION  02/18/2013   @ [place]  by Dr Martinique;  normal coronaries w/ preserved LVF, ef 50%;   previous cath 2001 normal ef 65%   CARPAL TUNNEL RELEASE Bilateral 1994   CATARACT EXTRACTION W/ INTRAOCULAR LENS IMPLANT Bilateral 2017   CHOLECYSTECTOMY, LAPAROSCOPIC  1993   COLONOSCOPY     EAR BIOPSY Left    FINGER SURGERY Left 2018   thumb   FOOT SURGERY     FOOT TENDON SURGERY Right    early 2000s   HAND SURGERY Right 04/2022   HARDWARE REMOVAL Left 03/12/2021   Procedure: HARDWARE REMOVAL;  Surgeon: Nicholes Stairs, MD;  Location: [facility];  Service: Orthopedics;  Laterality: Left;  60 MINS   ORIF ANKLE FRACTURE  Left 02/26/2020   Procedure: OPEN REDUCTION INTERNAL FIXATION (ORIF) ANKLE FRACTURE;  Surgeon: Nicholes Stairs, MD;  Location: [place];  Service: Orthopedics;  Laterality: Left;  90 mins   TONSILLECTOMY AND ADENOIDECTOMY Bilateral    TOTAL KNEE ARTHROPLASTY Left 03/2005   TOTAL VAGINAL HYSTERECTOMY  1988   per pt still has ovaries    Allergies  Allergies  Allergen Reactions   Ketek [Telithromycin] Nausea And Vomiting and Rash   Loxapine Succinate Hives   Naproxen Sodium Anaphylaxis and Hives   Amoxapine And Related Hives   Darvon Nausea And Vomiting   Belsomra [Suvorexant] Other (See Comments)    "Nightmares"   Cymbalta [Duloxetine Hcl] Other (See Comments)    "Blackouts"   Duloxetine    Gabapentin Other (See Comments)  Blurry vision   Lyrica [Pregabalin]     Makes her sedated   Amoxicillin Rash   Propoxyphene Nausea And Vomiting    Home Medications    Prior to Admission medications   Medication Sig Start Date End Date Taking? Authorizing Provider  albuterol (VENTOLIN HFA) 108 (90 Base) MCG/ACT inhaler INHALE 1 TO 2 PUFFS INTO THE LUNGS EVERY 6 HOURS AS NEEDED FOR WHEEZING OR SHORTNESS OF BREATH 02/04/22   Marge Duncans, PA-C  amLODipine (NORVASC) 5 MG tablet TAKE 1 TABLET(5 MG) BY MOUTH DAILY 07/15/22   Tobb, Kardie, DO  aspirin EC (ASPIRIN LOW DOSE) 81 MG tablet TAKE 1 TABLET(81 MG) BY MOUTH DAILY 08/02/22   Tobb, Kardie, DO  atorvastatin (LIPITOR) 80 MG tablet TAKE 1 TABLET(80 MG) BY MOUTH DAILY 07/15/22   Tobb, Kardie, DO  azelastine (ASTELIN) 0.1 % nasal spray Place 2 sprays into both nostrils daily as needed for rhinitis or allergies. 09/10/21   Marge Duncans, PA-C  Calcium Citrate-Vitamin D (CALCIUM CITRATE + PO) Take by mouth.    [provider]  Cholecalciferol (VITAMIN D3) 50 MCG (2000 UT) TABS Take 4,000 Units by mouth daily with lunch.    [provider]  Continuous Blood Gluc Sensor (FREESTYLE LIBRE 2 SENSOR) MISC 2 each by Does not apply route  every 14 (fourteen) days. E11.40 11/20/20   CoxElnita Maxwell, MD  dapagliflozin propanediol (FARXIGA) 10 MG TABS tablet Take 1 tablet (10 mg total) by mouth daily before breakfast. 05/27/22   Shamleffer, Melanie Crazier, MD  denosumab (PROLIA) 60 MG/ML SOSY injection     [provider]  estradiol (ESTRACE) 0.1 MG/GM vaginal cream Place 1 Applicatorful vaginally 2 (two) times a week. Uses twice weekly 06/10/20   [provider]  ferrous sulfate 325 (65 FE) MG tablet Take 325 mg by mouth daily with lunch.    [provider]  furosemide (LASIX) 40 MG tablet Take 1 tablet (40 mg total) by mouth daily. 07/15/22   Tobb, Kardie, DO  GEMTESA 75 MG TABS Take by mouth. 11/09/21   [provider]  Insulin Aspart FlexPen (NOVOLOG) 100 UNIT/ML Max daily 30 units 03/30/22   Shamleffer, Melanie Crazier, MD  insulin glargine, 1 Unit Dial, (TOUJEO) 300 UNIT/ML Solostar Pen Inject 8 Units into the skin at bedtime. 05/27/22   Shamleffer, Melanie Crazier, MD  Insulin Pen Needle (BD PEN NEEDLE NANO 2ND GEN) 32G X 4 MM MISC USE DAILY AS DIRECTED 08/16/22   Cox, Kirsten, MD  ipratropium-albuterol (DUONEB) 0.5-2.5 (3) MG/3ML SOLN Take 3 mLs by nebulization 4 (four) times daily as needed (wheezing/shortness of breath). 11/07/17   [provider]  isosorbide mononitrate (IMDUR) 30 MG 24 hr tablet 1 tablet by mouth daily, 2nd attempt, please reach arrange appt for further refills 07/15/22   Tobb, Kardie, DO  lisinopril (ZESTRIL) 20 MG tablet TAKE 2 TABLETS(40 MG) BY MOUTH TWICE DAILY 07/15/22   Tobb, Kardie, DO  magnesium oxide (MAG-OX) 400 (240 Mg) MG tablet TAKE 2 TABLETS(800 MG) BY MOUTH TWICE DAILY Patient taking differently: Take 800 mg by mouth 2 (two) times daily. 02/15/21   Lillard Anes, MD  methocarbamol (ROBAXIN) 750 MG tablet Take 750 mg by mouth at bedtime.    [provider]  Niacin (VITAMIN B-3 PO) Take by mouth.    [provider]  nystatin cream  (MYCOSTATIN) APPLY TO AREA OF RED IRRITATED RASH 3 TO 4 TIMES A DAY FOR SKIN YEAST INFECTION    [provider]  ondansetron (ZOFRAN-ODT) 8 MG disintegrating tablet Take 8 mg by mouth every 8 (eight) hours as needed for nausea or vomiting.     [provider]  pantoprazole (PROTONIX) 40 MG tablet TAKE 1 TABLET(40 MG) BY MOUTH EVERY MORNING 05/15/22   Cox, Kirsten, MD  warfarin (COUMADIN) 1 MG tablet Take 1 tablet (1 mg total) by mouth as directed. 02/11/22   Mosher, Vida Roller A, PA-C  warfarin (COUMADIN) 5 MG tablet TAKE 1 TABLET BY MOUTH EVERY DAY 09/21/22   Marvia Pickles, PA-C    Physical Exam    Vital Signs:  Pamela [name] does not have vital signs available for review today.  Given telephonic nature of communication, physical exam is limited. AAOx3. NAD. Normal affect.  Speech and respirations are unlabored.  Accessory Clinical Findings    None  Assessment & Plan    1.  Preoperative Cardiovascular Risk Assessment:  Ms. Berardino perioperative risk of a major cardiac event is 11% according to the Revised Cardiac Risk Index (RCRI).  Therefore, she is at high risk for perioperative complications.   Her functional capacity is fair at 4.73 METs according to the Duke Activity Status Index (DASI). Recommendations: According to ACC/AHA guidelines, no further cardiovascular testing needed.  The patient may proceed to surgery at acceptable risk.   Antiplatelet and/or Anticoagulation Recommendations: Aspirin can be held for 5-7 days prior to her surgery.  Please resume Aspirin post operatively when it is felt to be safe from a bleeding standpoint.   The patient was advised that if she develops new symptoms prior to surgery to contact our office to arrange for a follow-up visit, and she verbalized understanding.   A copy of this note will be routed to requesting surgeon.  Time:   Today, I have spent 15 minutes with the patient with telehealth technology discussing  medical history, symptoms, and management plan.     Elgie Collard, PA-C  11/04/2022, 9:22 AM

## 2022-11-04 NOTE — Telephone Encounter (Signed)
Pt had a tele visit for pre op today. Per Johann Capers, pre op APP today pt tells her that she is not having surgery with Dr. Rolena Infante any longer and she has decided to go with Dr. Kristeen Miss with [facility]. We have not received a clearance request from Dr. Clarice Pole office. I called Dr. Clarice Pole office and left message for his surgery scheduler that we need them to fax over ASAP a clearance from their office. We cannot use the clearance request from Dr. Raeanne [facility] office.   We cannot proceed any further for the pt until we have the clearance request from Dr. Ellene Route office. Due to the new information, I will also fax this note to Dr. Ellene Route office as well as I did leave a vm a few minutes ago that we need the clearance request to come from their office now.   Please fax to [phone] attn: pre op team Arbie Cookey

## 2022-11-07 ENCOUNTER — Telehealth: Payer: Self-pay

## 2022-11-07 NOTE — Telephone Encounter (Signed)
Pt's INR 3.5 this morning She is taking warfarin '5mg'$  po qd. Please advise.

## 2022-11-08 ENCOUNTER — Telehealth: Payer: Self-pay

## 2022-11-08 NOTE — Telephone Encounter (Signed)
     Primary Cardiologist: Berniece Salines, DO  Chart reviewed as part of pre-operative protocol coverage. Given past medical history and time since last visit, based on ACC/AHA guidelines, Pamela [name] would be at acceptable risk for the planned procedure without further cardiovascular testing.   Her aspirin may be held for 5 to 7 days prior to her procedure.  Please resume when medically safe to do so.  Cardiology does not manage her Coumadin. Recommendations for holding Coumadin prior to surgery should come from with the managing provider.   I will route this recommendation to the requesting party via Epic fax function and remove from pre-op pool.  Please call with questions.  Jossie Ng. [name] NP-C     11/08/2022, 1:31 PM [facility] [place] [address] Office [phone] Fax [phone]

## 2022-11-08 NOTE — Telephone Encounter (Signed)
   Pre-operative Risk Assessment    Patient Name: Pamela [name]  DOB: April 25, 1946 MRN: [id number]      Request for Surgical Clearance    Procedure:   L1, L4, L5 Kyphoplasty with L2-3, L3-4 Decompression.  Date of Surgery:  Clearance TBD                                 Surgeon:  Dr. Kristeen Miss Surgeon's Group or Practice Name:  [facility]. Phone number:  [phone] Fax number:  [phone]   Type of Clearance Requested:   - Medical  - Pharmacy:  Hold Aspirin and Warfarin (Coumadin) Not Indicated   Type of Anesthesia:  General    Additional requests/questions:    Eliott Nine   11/08/2022, 12:14 PM

## 2022-11-08 NOTE — Telephone Encounter (Signed)
Patient on warfarin for protein S deficiency, DVT and PE med being managed by heme/onc. Recommend clearance come from managing provider.

## 2022-11-09 ENCOUNTER — Other Ambulatory Visit: Payer: Self-pay | Admitting: Neurological Surgery

## 2022-11-09 ENCOUNTER — Telehealth: Payer: Self-pay

## 2022-11-09 ENCOUNTER — Other Ambulatory Visit: Payer: Self-pay

## 2022-11-09 NOTE — Telephone Encounter (Signed)
Pamela [name] called to make Pamela [name] aware that she is scheduled for surgery on 11/22/2022. She was told to stop warfarin 5 days pre op. Pamela [name] asking about when to start Lovenox, and that she will need refill. She only has 9 Lovenox injections @ home.

## 2022-11-11 ENCOUNTER — Other Ambulatory Visit: Payer: Self-pay

## 2022-11-11 DIAGNOSIS — D6859 Other primary thrombophilia: Secondary | ICD-10-CM

## 2022-11-11 DIAGNOSIS — Z9889 Other specified postprocedural states: Secondary | ICD-10-CM

## 2022-11-11 MED ORDER — ENOXAPARIN SODIUM 80 MG/0.8ML IJ SOSY: 80.0000 mg | PREFILLED_SYRINGE | Freq: Two times a day (BID) | INTRAMUSCULAR | 1 refills | Status: DC

## 2022-11-14 ENCOUNTER — Telehealth: Payer: Self-pay

## 2022-11-14 NOTE — Progress Notes (Cosign Needed)
Received a call from [place], regarding diagnosis code for benefit verification request that was sent on 11/09/2022. Per voicemail, wanting to verify if diagnosis is for Osteoporosis or for bone cancer.  Called and spoke with representative Laquita, she was able to pull up case and it shows that the benefit verification was sent under the wrong Prolia Option, was sent for Bone cancer. Representative will get this closed, I will need to send another request for benefit verification for Prolia coverage.  Sent request for Prolia benefits, awaiting benefit verification.  Pattricia Boss, [place] Pharmacist Assistant [phone]

## 2022-11-15 ENCOUNTER — Telehealth: Payer: Self-pay

## 2022-11-15 DIAGNOSIS — D6859 Other primary thrombophilia: Secondary | ICD-10-CM | POA: Diagnosis not present

## 2022-11-15 DIAGNOSIS — Z7901 Long term (current) use of anticoagulants: Secondary | ICD-10-CM | POA: Diagnosis not present

## 2022-11-15 IMAGING — MG DIGITAL SCREENING BILAT W/ CAD
4 series · 4 of 4 positions shown · non-contrast
Comparison: Previous exam(s).

ACR Breast Density Category a: The breast tissue is almost entirely
fatty.

CLINICAL DATA: Screening.

EXAM:
DIGITAL SCREENING BILATERAL MAMMOGRAM WITH CAD
TECHNIQUE: Bilateral screening digital craniocaudal and mediolateral oblique
mammograms were obtained. The images were evaluated with
computer-aided detection.

[L CC]
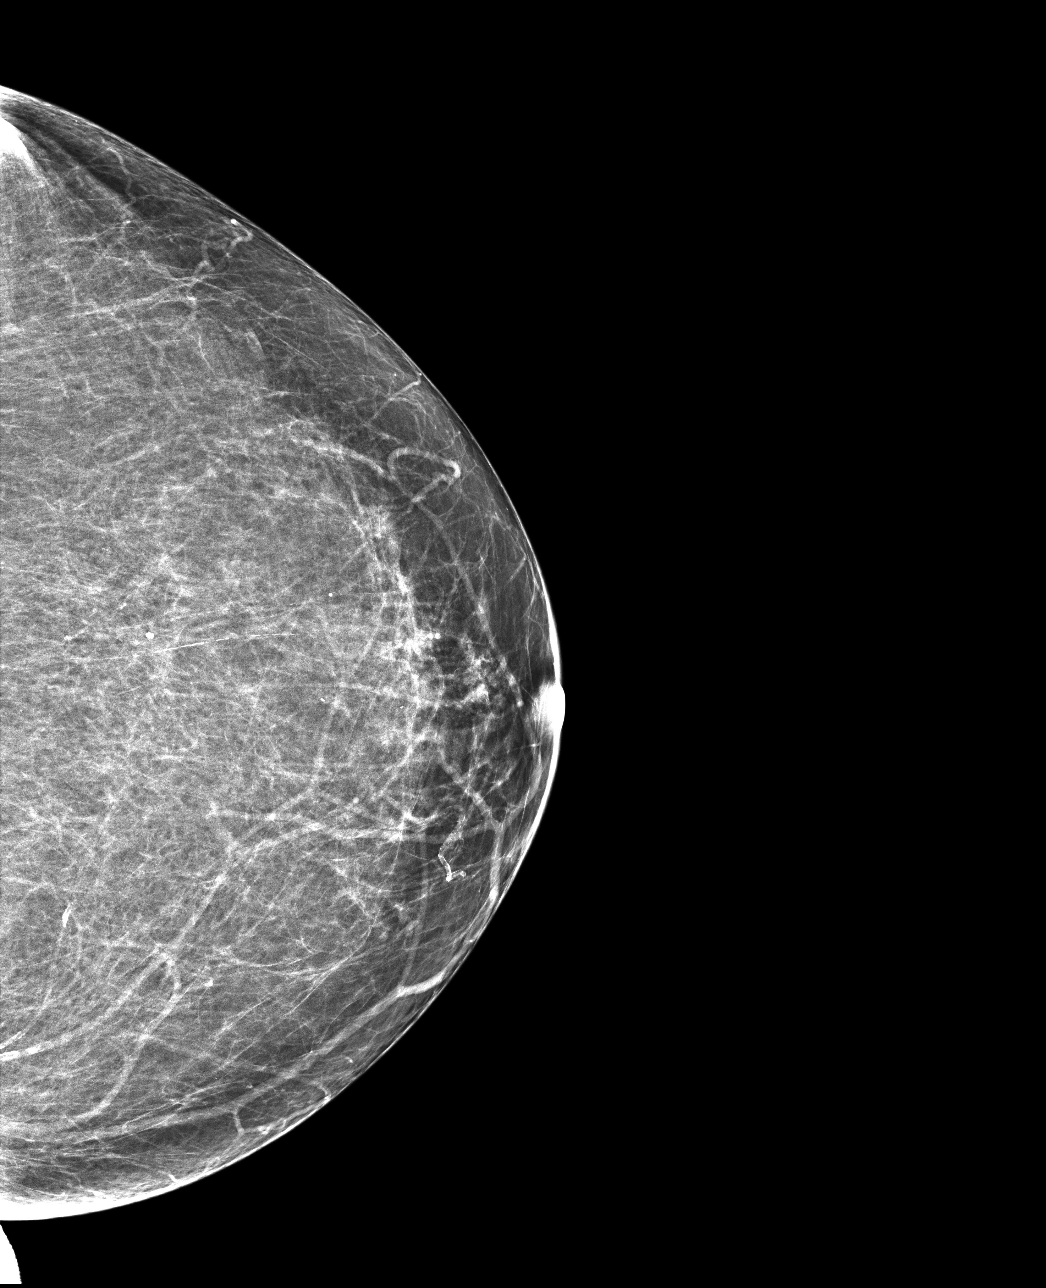

[L MLO]
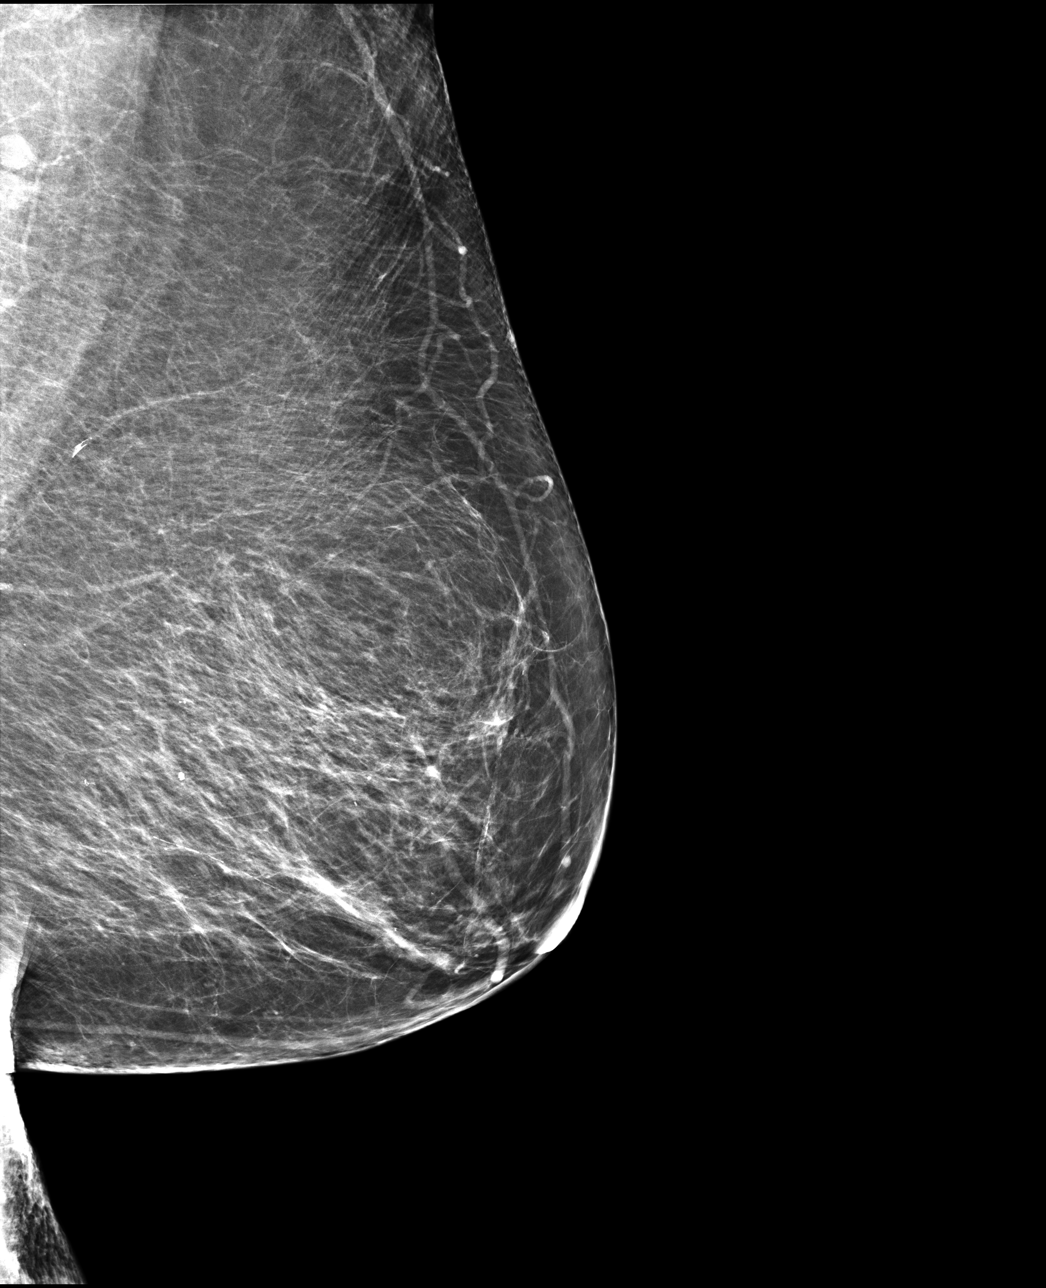

[R MLO]
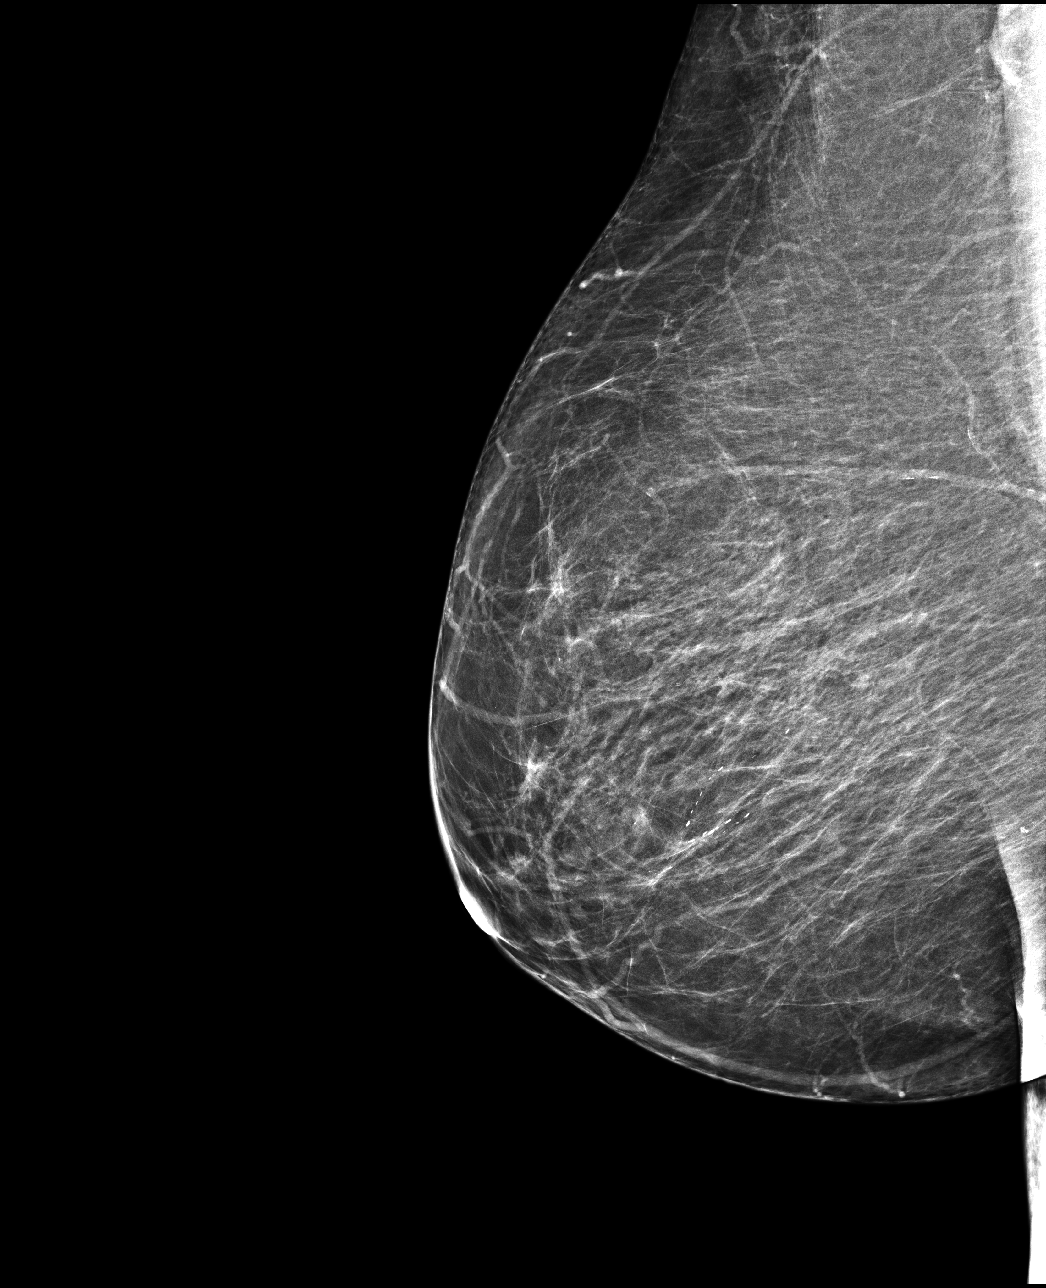

[R CC]
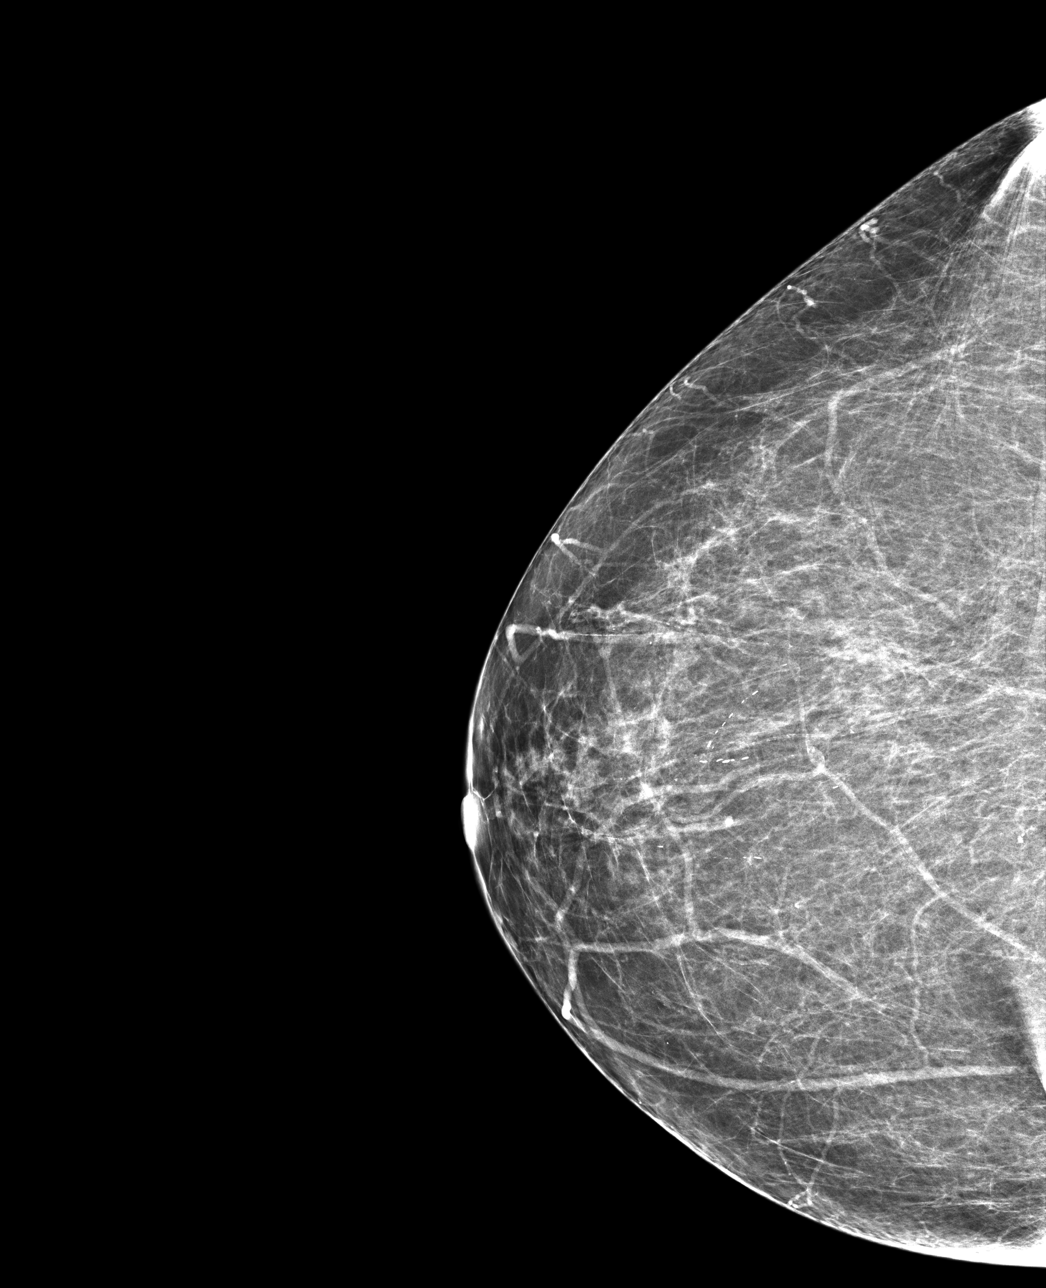

[4 of 4 positions shown; findings below may reference images not displayed]

FINDINGS: There are no findings suspicious for malignancy.
IMPRESSION: No mammographic evidence of malignancy. A result letter of this
screening mammogram will be mailed directly to the patient.

RECOMMENDATION:
Screening mammogram in one year. (Code:9G-[id number])

BI-RADS CATEGORY  1: Negative.

## 2022-11-15 NOTE — Telephone Encounter (Signed)
Dr Bobby Rumpf notified of pt's INR this morning which was INR 2.6. No new orders. Pt mentioned she will start Lovenox on Thursday in preparation for surgery.

## 2022-11-15 NOTE — Pre-Procedure Instructions (Signed)
Surgical Instructions    Your procedure is scheduled on November 22, 2022.  Report to [facility] Main Entrance "A" at 05:30 A.M., then check in with the Admitting office.  Call this number if you have problems the morning of surgery:  [phone]   If you have any questions prior to your surgery date call [phone]: Open Monday-Friday 8am-4pm If you experience any cold or flu symptoms such as cough, fever, chills, shortness of breath, etc. between now and your scheduled surgery, please notify us at the above number     Remember:  Do not eat after midnight the night before your surgery  You may drink clear liquids until 04:30 the morning of your surgery.   Clear liquids allowed are: Water, Non-Citrus Juices (without pulp), Carbonated Beverages, Clear Tea, Black Coffee ONLY (NO MILK, CREAM OR POWDERED CREAMER of any kind), and Gatorade    Take these medicines the morning of surgery with A SIP OF WATER:  Amlodipine (Norvasc) Atorvastatin (Lipitor) Isosorbide Mononitrate (Imdur) Protonix (Pantroprazole)  If Needed: Acetaminophen (Tylenol) Albuterol (Ventolin HFA) Carboxymethylcellulose (Refresh) eye drops Ipratropium-Albuterol (Nuoneb) nebulizer Ondansetron (Zofran-ODT) Sodium chloride (Ocean 0.65%) nasal spray  STOP your Coumadin 5 days prior to surgery and follow  your Lovenox bridge instructions.  STOP your Aspirin 5-7 days prior to surgery.  As of today, STOP taking any Aleve, Naproxen, Ibuprofen, Motrin, Advil, Goody's, BC's, all herbal medications, fish oil, and all vitamins.  WHAT DO I DO ABOUT MY DIABETES MEDICATION?  DO NOT TAKE Dapagliflozin propanediol (FARXIGA) for 72 hours prior to surgery.  THE DAY BEFORE SURGERY, You may take your normal mealtime dose of insulin aspart (NOVOLOG). Do not take a bedtime dose.   THE NIGHT BEFORE SURGERY, take 4 units of insulin glargine (TOUJEO).  Do not take oral diabetes medicines (pills) the morning of surgery.     THE  MORNING OF SURGERY, If your CBG is greater than 220 mg/dL, you may take  of your sliding scale (correction) dose of insulin asparat (NOVOLOG).   HOW TO MANAGE YOUR DIABETES BEFORE AND AFTER SURGERY  Why is it important to control my blood sugar before and after surgery? Improving blood sugar levels before and after surgery helps healing and can limit problems. A way of improving blood sugar control is eating a healthy diet by:  Eating less sugar and carbohydrates  Increasing activity/exercise  Talking with your doctor about reaching your blood sugar goals High blood sugars (greater than 180 mg/dL) can raise your risk of infections and slow your recovery, so you will need to focus on controlling your diabetes during the weeks before surgery. Make sure that the doctor who takes care of your diabetes knows about your planned surgery including the date and location.  How do I manage my blood sugar before surgery? Check your blood sugar at least 4 times a day, starting 2 days before surgery, to make sure that the level is not too high or low.  Check your blood sugar the morning of your surgery when you wake up and every 2 hours until you get to the Short Stay unit.  If your blood sugar is less than 70 mg/dL, you will need to treat for low blood sugar: Do not take insulin. Treat a low blood sugar (less than 70 mg/dL) with  cup of clear juice (cranberry or apple), 4 glucose tablets, OR glucose gel. Recheck blood sugar in 15 minutes after treatment (to make sure it is greater than 70 mg/dL). If your blood  sugar is not greater than 70 mg/dL on recheck, call [phone] for further instructions. Report your blood sugar to the short stay nurse when you get to Short Stay.  If you are admitted to the hospital after surgery: Your blood sugar will be checked by the staff and you will probably be given insulin after surgery (instead of oral diabetes medicines) to make sure you have good blood sugar  levels. The goal for blood sugar control after surgery is 80-180 mg/dL.            Do not wear jewelry or makeup. Do not wear lotions, powders, perfumes or deodorant. Do not shave 48 hours prior to surgery.   Do not bring valuables to the hospital. Do not wear nail polish, gel polish, artificial nails, or any other type of covering on natural nails (fingers and toes) If you have artificial nails or gel coating that need to be removed by a nail salon, please have this removed prior to surgery. Artificial nails or gel coating may interfere with anesthesia's ability to adequately monitor your vital signs.  [facility] is not responsible for any belongings or valuables.    Do NOT Smoke (Tobacco/Vaping)  24 hours prior to your procedure  If you use a CPAP at night, you may bring your mask for your overnight stay.   Contacts, glasses, hearing aids, dentures or partials may not be worn into surgery, please bring cases for these belongings   For patients admitted to the hospital, discharge time will be determined by your treatment team.   Patients discharged the day of surgery will not be allowed to drive home, and someone needs to stay with them for 24 hours.   SURGICAL WAITING ROOM VISITATION Patients having surgery or a procedure may have no more than 2 support people in the waiting area - these visitors may rotate.   Children under the age of 52 must have an adult with them who is not the patient. If the patient needs to stay at the hospital during part of their recovery, the visitor guidelines for inpatient rooms apply. Pre-op nurse will coordinate an appropriate time for 1 support person to accompany patient in pre-op.  This support person may not rotate.   Please refer to [email or web address] for the visitor guidelines for Inpatients (after your surgery is over and you are in a regular room).    Special instructions:    Oral Hygiene  is also important to reduce your risk of infection.  Remember - BRUSH YOUR TEETH THE MORNING OF SURGERY WITH YOUR REGULAR TOOTHPASTE   [place]- Preparing For Surgery  Before surgery, you can play an important role. Because skin is not sterile, your skin needs to be as free of germs as possible. You can reduce the number of germs on your skin by washing with CHG (chlorahexidine gluconate) Soap before surgery.  CHG is an antiseptic cleaner which kills germs and bonds with the skin to continue killing germs even after washing.     Please do not use if you have an allergy to CHG or antibacterial soaps. If your skin becomes reddened/irritated stop using the CHG.  Do not shave (including legs and underarms) for at least 48 hours prior to first CHG shower. It is OK to shave your face.  Please follow these instructions carefully.     Shower the NIGHT BEFORE SURGERY and the MORNING OF SURGERY with CHG Soap.   If you chose to wash your hair, wash your hair  first as usual with your normal shampoo. After you shampoo, rinse your hair and body thoroughly to remove the shampoo.  Then [employer] and genitals (private parts) with your normal soap and rinse thoroughly to remove soap.  After that Use CHG Soap as you would any other liquid soap. You can apply CHG directly to the skin and wash gently with a scrungie or a clean washcloth.   Apply the CHG Soap to your body ONLY FROM THE NECK DOWN.  Do not use on open wounds or open sores. Avoid contact with your eyes, ears, mouth and genitals (private parts). Wash Face and genitals (private parts)  with your normal soap.   Wash thoroughly, paying special attention to the area where your surgery will be performed.  Thoroughly rinse your body with warm water from the neck down.  DO NOT shower/wash with your normal soap after using and rinsing off the CHG Soap.  Pat yourself dry with a CLEAN TOWEL.  Wear CLEAN PAJAMAS to bed the night before surgery  Place  CLEAN SHEETS on your bed the night before your surgery  DO NOT SLEEP WITH PETS.   Day of Surgery:  Take a shower with CHG soap. Wear Clean/Comfortable clothing the morning of surgery Do not apply any deodorants/lotions.   Remember to brush your teeth WITH YOUR REGULAR TOOTHPASTE.    If you received a COVID test during your pre-op visit, it is requested that you wear a mask when out in public, stay away from anyone that may not be feeling well, and notify your surgeon if you develop symptoms. If you have been in contact with anyone that has tested positive in the last 10 days, please notify your surgeon.    Please read over the following fact sheets that you were given.

## 2022-11-16 ENCOUNTER — Encounter (HOSPITAL_COMMUNITY): Payer: Self-pay

## 2022-11-16 ENCOUNTER — Other Ambulatory Visit: Payer: Self-pay

## 2022-11-16 ENCOUNTER — Encounter (HOSPITAL_COMMUNITY)
Admission: RE | Admit: 2022-11-16 | Discharge: 2022-11-16 | Disposition: A | Discharge status: Home / Self Care | Payer: Medicare PPO | Source: Ambulatory Visit | Attending: Neurological Surgery | Admitting: Neurological Surgery

## 2022-11-16 VITALS — BP 140/68 | HR 41 | Temp 98.3°F | Resp 18 | Ht 61.0 in | Wt 151.0 lb

## 2022-11-16 DIAGNOSIS — Z01812 Encounter for preprocedural laboratory examination: Secondary | ICD-10-CM | POA: Diagnosis not present

## 2022-11-16 DIAGNOSIS — J45909 Unspecified asthma, uncomplicated: Secondary | ICD-10-CM | POA: Diagnosis not present

## 2022-11-16 DIAGNOSIS — Z8673 Personal history of transient ischemic attack (TIA), and cerebral infarction without residual deficits: Secondary | ICD-10-CM | POA: Diagnosis not present

## 2022-11-16 DIAGNOSIS — E1122 Type 2 diabetes mellitus with diabetic chronic kidney disease: Secondary | ICD-10-CM | POA: Insufficient documentation

## 2022-11-16 DIAGNOSIS — Z7901 Long term (current) use of anticoagulants: Secondary | ICD-10-CM | POA: Insufficient documentation

## 2022-11-16 DIAGNOSIS — I129 Hypertensive chronic kidney disease with stage 1 through stage 4 chronic kidney disease, or unspecified chronic kidney disease: Secondary | ICD-10-CM | POA: Insufficient documentation

## 2022-11-16 DIAGNOSIS — I480 Paroxysmal atrial fibrillation: Secondary | ICD-10-CM | POA: Diagnosis not present

## 2022-11-16 DIAGNOSIS — Z86718 Personal history of other venous thrombosis and embolism: Secondary | ICD-10-CM | POA: Diagnosis not present

## 2022-11-16 DIAGNOSIS — M8008XA Age-related osteoporosis with current pathological fracture, vertebra(e), initial encounter for fracture: Secondary | ICD-10-CM | POA: Diagnosis not present

## 2022-11-16 DIAGNOSIS — N183 Chronic kidney disease, stage 3 unspecified: Secondary | ICD-10-CM | POA: Diagnosis not present

## 2022-11-16 DIAGNOSIS — D6859 Other primary thrombophilia: Secondary | ICD-10-CM | POA: Insufficient documentation

## 2022-11-16 DIAGNOSIS — G4733 Obstructive sleep apnea (adult) (pediatric): Secondary | ICD-10-CM | POA: Insufficient documentation

## 2022-11-16 DIAGNOSIS — Z86711 Personal history of pulmonary embolism: Secondary | ICD-10-CM | POA: Diagnosis not present

## 2022-11-16 DIAGNOSIS — Z794 Long term (current) use of insulin: Secondary | ICD-10-CM | POA: Insufficient documentation

## 2022-11-16 DIAGNOSIS — Z01818 Encounter for other preprocedural examination: Secondary | ICD-10-CM

## 2022-11-16 HISTORY — DX: Cardiac arrhythmia, unspecified: I49.9

## 2022-11-16 HISTORY — DX: Disease of blood and blood-forming organs, unspecified: D75.9

## 2022-11-16 LAB — HEMOGLOBIN A1C
Hgb A1c MFr Bld: 7.3 % — ABNORMAL HIGH (ref 4.8–5.6)
Mean Plasma Glucose: 163 mg/dL

## 2022-11-16 LAB — BASIC METABOLIC PANEL
Anion gap: 10 (ref 5–15)
BUN: 23 mg/dL (ref 8–23)
CO2: 29 mmol/L (ref 22–32)
Calcium: 9.1 mg/dL (ref 8.9–10.3)
Chloride: 97 mmol/L — ABNORMAL LOW (ref 98–111)
Creatinine, Ser: 1.2 mg/dL — ABNORMAL HIGH (ref 0.44–1.00)
GFR, Estimated: 46 mL/min — ABNORMAL LOW (ref >60)
Glucose, Bld: 138 mg/dL — ABNORMAL HIGH (ref 70–99)
Potassium: 4 mmol/L (ref 3.5–5.1)
Sodium: 136 mmol/L (ref 135–145)

## 2022-11-16 LAB — CBC
HCT: 41.4 % (ref 36.0–46.0)
Hemoglobin: 13.9 g/dL (ref 12.0–15.0)
MCH: 30.8 pg (ref 26.0–34.0)
MCHC: 33.6 g/dL (ref 30.0–36.0)
MCV: 91.6 fL (ref 80.0–100.0)
Platelets: 285 10*3/uL (ref 150–400)
RBC: 4.52 MIL/uL (ref 3.87–5.11)
RDW: 14.1 % (ref 11.5–15.5)
WBC: 9.6 10*3/uL (ref 4.0–10.5)
nRBC: 0 % (ref 0.0–0.2)

## 2022-11-16 LAB — SURGICAL PCR SCREEN
MRSA, PCR: NEGATIVE
Staphylococcus aureus: NEGATIVE

## 2022-11-16 LAB — GLUCOSE, CAPILLARY: Glucose-Capillary: 122 mg/dL — ABNORMAL HIGH (ref 70–99)

## 2022-11-16 NOTE — Progress Notes (Signed)
PCP - [name] MD Cardiologist - Berniece Salines MD Hematology/Oncology - Lavera Guise MD  PPM/ICD - denies Device Orders -  Rep Notified -   Chest x-ray - na EKG - 08/05/22 Stress Test - 12/22/18 ECHO - 12/31/19 Cardiac Cath - 02/18/13  Sleep Study - Pt states she had a sleep study 10-12 years ago but does not remember where and does not wear a  CPAP. She says she was told she had a mild case of sleep apnea.  CPAP -   Fasting Blood Sugar - 80-100 Checks Blood Sugar four times a day  Last dose of GLP1 agonist-  na GLP1 instructions:   Blood Thinner Instructions:stopping Coumadin today 3/13. Starting Lovenox BID 3/14-3/17. Lovenox once am 3/18. Instructions per her prescribing MD-Dequincy Lewis MD. Aspirin Instructions:stop five days prior to surgery per Dr. Ellene Route .  ERAS Protcol -clear liquids until 0430. PRE-SURGERY Ensure or G2- no  COVID TEST- na   Anesthesia review: yes -cardiac history-afib and clotting disorder-proteins S deficiency  Patient denies shortness of breath, fever, cough and chest pain at PAT appointment   All instructions explained to the patient, with a verbal understanding of the material. Patient agrees to go over the instructions while at home for a better understanding. Patient also instructed to wear a mask when out in public prior to surgery. The opportunity to ask questions was provided.

## 2022-11-17 ENCOUNTER — Other Ambulatory Visit: Payer: Self-pay | Admitting: Neurological Surgery

## 2022-11-17 ENCOUNTER — Encounter (HOSPITAL_COMMUNITY): Payer: Self-pay | Admitting: Emergency Medicine

## 2022-11-17 DIAGNOSIS — M8580 Other specified disorders of bone density and structure, unspecified site: Secondary | ICD-10-CM

## 2022-11-17 NOTE — Anesthesia Preprocedure Evaluation (Deleted)
Anesthesia Evaluation    Airway        Dental   Pulmonary           Cardiovascular hypertension,      Neuro/Psych    GI/Hepatic   Endo/Other  diabetes  Renal/GU      Musculoskeletal   Abdominal   Peds  Hematology   Anesthesia Other Findings   Reproductive/Obstetrics                             Anesthesia Physical Anesthesia Plan  ASA:   Anesthesia Plan:    Post-op Pain Management:    Induction:   PONV Risk Score and Plan:   Airway Management Planned:   Additional Equipment:   Intra-op Plan:   Post-operative Plan:   Informed Consent:   Plan Discussed with:   Anesthesia Plan Comments: (See APP note by A. Elyse Prevo, FNP )        Anesthesia Quick Evaluation  

## 2022-11-17 NOTE — Progress Notes (Signed)
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 Last dose lovenox 11/21/22 in morning.   VS: BP (!) 140/68   Pulse (!) 41   Temp 36.8 C (Oral)   Resp 18   Ht '5\' 1"'$  (1.549 m)   Wt 68.5 kg   SpO2 98%   BMI 28.53 kg/m   PROVIDERS: - PCP is Cox, Kirsten, MD - Hematologist is Lavera Guise, MD - CArdiologist is Berniece Salines, DO. Cleared for surgery at last virtual visit 11/04/22 with Nicholes Rough, PA. Last office visit 07/15/22 with Dr. Harriet Masson   LABS: Labs reviewed: Acceptable for surgery. (all labs ordered are listed, but only abnormal results are displayed)  Labs Reviewed  GLUCOSE, CAPILLARY - Abnormal; Notable for the following components:      Result Value   Glucose-Capillary 122 (*)    All other components within normal limits  HEMOGLOBIN A1C - Abnormal; Notable for the following components:   Hgb A1c MFr Bld 7.3 (*)    All other components within normal limits  BASIC METABOLIC PANEL - Abnormal; Notable for the following components:   Chloride 97 (*)    Glucose, Bld 138 (*)    Creatinine, Ser 1.20 (*)    GFR, Estimated 46 (*)    All other components within normal limits  SURGICAL PCR SCREEN  CBC     IMAGES: CXR 08/04/22:  - No active cardiopulmonary disease.   CT angio chest/abd/pelvis  1. No evidence of acute aortic process. 2. Findings of urinary tract infection with cystitis and RIGHT pyelonephritis with areas of more focal nephritis scattered about the RIGHT kidney. Correlate with  urinalysis. 3. Given indistinct appearance of areas of the RIGHT kidney would suggest follow-up with renal sonogram to ensure resolution of lobular and mildly infiltrative appearing areas which in the setting of only a single phase exam are almost certainly related to pyelonephritis as outlined above but not as well characterized as could be with a more standard venous phase assessment. Follow-up at 6-8 weeks is suggested. 4. No signs of central or lobar level pulmonary embolism. 5. Replaced RIGHT hepatic artery. 6. Aortic atherosclerosis. 7. Small pericardial effusion that is unchanged.   EKG 08/05/22: Sinus rhythm Ventricular premature complex LAE, consider biatrial enlargement Anteroseptal infarct, old Minimal ST depression, lateral leads No significant change since prior 10/23  CV: CT coronary morphology 10/02/20:  1. Coronary calcium score of 20.4. This was 38 percentile for age and sex matched control. 2. Normal coronary origin with right dominance. 3. Minimal CAD. CADRADS 1. Aggressive medical therapy is recommended. 4. Mild mitral annular calcification. 5. Aortic Atherosclerosis.  Cardiac event monitor 09/14/20:  - This study is remarkable for rare supraventricular tachycardia which is likely atrial tachycardia with variable block.   Echo 12/31/19:  1. Left ventricular ejection fraction, by estimation, is 60 to 65%. The left ventricle has normal function. The left ventricle has no regional wall  motion abnormalities. There is moderate concentric left ventricular hypertrophy. Left ventricular diastolic parameters are consistent with Grade I diastolic dysfunction (impaired relaxation).  2. Right ventricular systolic function is normal. The right ventricular size is normal. There is normal pulmonary artery systolic pressure.  3. Left atrial size was mildly dilated.  4. The mitral valve is normal in structure. Trivial mitral valve regurgitation. No evidence of mitral stenosis.  5. The  aortic valve is tricuspid. Aortic valve regurgitation is not visualized. No aortic stenosis is present.  6. The inferior vena cava is normal in size with greater than 50% respiratory variability, suggesting right atrial pressure of 3 mmHg.    Past Medical History:  Diagnosis Date   Anticoagulated on Coumadin    chronic--- managed by hematology/ oncology,   Asthma, mild intermittent    followed by pcp--- per pt last exacerbation winter 2021 w/ acute bronchitis   Bilateral leg cramps    Blood dyscrasia    Chronic constipation    CKD (chronic kidney disease), stage III ([place])    Closed bimalleolar fracture of left ankle 02/26/2020   DDD (degenerative disc disease), cervical    w/ spondylosis,  per pt last steroid injection 06/ 2022   DDD (degenerative disc disease), lumbosacral    DVT (deep venous thrombosis) ([place]) 07/30/2013   Dyspnea    occasionally   Dysrhythmia    GERD (gastroesophageal reflux disease)    History of cardiac murmur as a child    History of DVT of lower extremity    left lower extremity in 1980s, fell when bowling   History of pulmonary embolus (PE) 1993   per pt left lung post op 2 wks cholecystectomy   History of rheumatic fever as a child    per last echo 12-31-2019 no valvular issues   History of TIA (transient ischemic attack)    2014 and 2018 or 2019,  per pt no residual   HTN (hypertension)    followed by pcp   Hyperlipidemia    IDA (iron deficiency anemia)    Macular degeneration of both eyes    Mild obstructive sleep apnea    per pt dx 2017 tried to uses cpap but intolerant   Mixed incontinence urge and stress    urologist--- dr Nila Nephew   OA (osteoarthritis) 07/06/2018   Osteoporosis    PAF (paroxysmal atrial fibrillation) ([place]) 10/01/2014   cardiologist--- dr Godfrey Pick tobb;   cardiac cath 02-18-2013 normal coronaries arteries, ef 50%, cath done since echo showed ef 30-35%; nucleat stress study 04/ 2020 normal , normal echo 04/ 2021,  event monitor  09-14-2020 rare ST/AT variable block   Pneumonia    PONV (postoperative nausea and vomiting)    Protein S deficiency ([place])    followed by hemotology/ oncology-- dr d. Bobby Rumpf ([facility]) dx 1980s;  prior DVT left lower leg 1980s and left lung PE 1993; chronic  coumadin since 1980s   PVC's (premature ventricular contractions)    followed by cardiology   S/P cardiac catheterization 02/2013   Normal coronaries; low normal EF at 50%   Solitary pulmonary nodule on lung CT 02/06/2019   First noted 01/13/2014 > no change as of 12/21/2018   Spondylolisthesis, lumbar region 08/09/2018   Stroke ([place])    TIA in 2018 or 2019   Transient ischemic attack 07/30/2013   Type 2 diabetes mellitus treated with insulin ([facility])    endocrilogist--- whitney reardon NP     (03-10-2021  pt continuously checks blood  sugar throughout the day w/ Libre, fasting sugar --- 69--200)   Wears glasses    Wears hearing aid in both ears     Past Surgical History:  Procedure Laterality Date   ABDOMINAL HYSTERECTOMY     BLEPHAROPLASTY     CARDIAC CATHETERIZATION  02/18/2013   @ [place] [facility]  by Dr Martinique;  normal coronaries w/ preserved LVF, ef 50%;   previous cath 2001 normal ef 65%   CARPAL TUNNEL RELEASE Bilateral 1994   CATARACT EXTRACTION W/ INTRAOCULAR LENS IMPLANT Bilateral 2017   CHOLECYSTECTOMY     CHOLECYSTECTOMY, LAPAROSCOPIC  1993   COLONOSCOPY     EAR BIOPSY Left    FINGER SURGERY Left 2018   thumb   FOOT SURGERY     FOOT TENDON SURGERY Right    early 2000s   FRACTURE SURGERY Left 02/13/2020   left femur   HAND SURGERY Right 04/2022   HARDWARE REMOVAL Left 03/12/2021   Procedure: HARDWARE REMOVAL;  Surgeon: Nicholes Stairs, MD;  Location: [facility];  Service: Orthopedics;  Laterality: Left;  60 MINS   JOINT REPLACEMENT     ORIF ANKLE FRACTURE Left 02/26/2020   Procedure: OPEN REDUCTION INTERNAL FIXATION (ORIF) ANKLE FRACTURE;  Surgeon: Nicholes Stairs, MD;  Location:  [facility];  Service: Orthopedics;  Laterality: Left;  90 mins   TONSILLECTOMY AND ADENOIDECTOMY Bilateral    TOTAL KNEE ARTHROPLASTY Left 03/2005   TOTAL VAGINAL HYSTERECTOMY  1988   per pt still has ovaries    MEDICATIONS:  acetaminophen (TYLENOL) 650 MG CR tablet   albuterol (VENTOLIN HFA) 108 (90 Base) MCG/ACT inhaler   amLODipine (NORVASC) 5 MG tablet   aspirin EC (ASPIRIN LOW DOSE) 81 MG tablet   atorvastatin (LIPITOR) 80 MG tablet   Calcium Citrate-Vitamin D (CALCIUM CITRATE + PO)   carboxymethylcellulose (REFRESH PLUS) 0.5 % SOLN   Cholecalciferol (VITAMIN D3) 50 MCG (2000 UT) TABS   CINNAMON PO   clindamycin (CLEOCIN) 300 MG capsule   Continuous Blood Gluc Sensor (FREESTYLE LIBRE 2 SENSOR) MISC   cyanocobalamin (VITAMIN B12) 1000 MCG tablet   dapagliflozin propanediol (FARXIGA) 10 MG TABS tablet   denosumab (PROLIA) 60 MG/ML SOSY injection   enoxaparin (LOVENOX) 80 MG/0.8ML injection   estradiol (ESTRACE) 0.1 MG/GM vaginal cream   ferrous sulfate 325 (65 FE) MG tablet   furosemide (LASIX) 40 MG tablet   GEMTESA 75 MG TABS   Insulin Aspart FlexPen (NOVOLOG) 100 UNIT/ML   insulin glargine, 1 Unit Dial, (TOUJEO) 300 UNIT/ML Solostar Pen   Insulin Pen Needle (BD PEN NEEDLE NANO 2ND GEN) 32G X 4 MM MISC   ipratropium-albuterol (DUONEB) 0.5-2.5 (3) MG/3ML SOLN   isosorbide mononitrate (IMDUR) 30 MG 24 hr tablet   lisinopril (ZESTRIL) 20 MG tablet   magnesium oxide (MAG-OX) 400 (240 Mg) MG tablet   methocarbamol (ROBAXIN) 750 MG tablet   Multiple Vitamins-Minerals (PRESERVISION AREDS PO)   nystatin cream (MYCOSTATIN)   ondansetron (ZOFRAN-ODT) 8 MG disintegrating tablet   pantoprazole (PROTONIX) 40 MG tablet   polyethylene glycol powder (MIRALAX) 17 GM/SCOOP powder   pyridoxine (B-6) 100 MG tablet   sennosides-docusate sodium (SENOKOT-S) 8.6-50 MG tablet   sodium chloride (OCEAN) 0.65 % SOLN nasal spray   traZODone (DESYREL) 50 MG tablet   warfarin (COUMADIN) 1 MG tablet    warfarin (COUMADIN) 5 MG tablet   No current facility-administered medications for this encounter.    If no changes, I anticipate pt can proceed with surgery as scheduled.  Willeen Cass, PhD, FNP-BC [facility]/Anesthesiology Phone: [phone] 11/17/2022 2:15 PM

## 2022-11-21 ENCOUNTER — Telehealth: Payer: Self-pay

## 2022-11-21 NOTE — Telephone Encounter (Signed)
I notified Dr Bobby Rumpf that pt's surgery was cancelled, as her insurance is requiring more pre operative things to be done. Pt states she will restart her warfarin tonight. No new orders form Dr Bobby Rumpf received.

## 2022-11-21 NOTE — Progress Notes (Cosign Needed)
Prolia benefits notification:  Per your request, [employer] has researched insurance benefits for therapy for your patient Pamela [name] with DOB Jan 29, 1946 for Prolia (denosumab). During the insurance verification, your patient's insurance plan [facility] advised that they will not release benefit information to a third party, including [facility]. Please contact the payer directly at [phone] to obtain benefits and prior authorization requirements for your patient.  Called [employer] spoke with representative, Reference# [id number]. Per Representative, No prior authorization needed OOP $4000, no deductible, $40 copay.   Getting in touch with Lorraine Lax representative to get a little more information on patient responsibility.   11/29/2022- I have not received a call back from [facility] representative, I went ahead and called patient to see if she would be continuing her Prolia injection. Patient stated she had her last injection in January, I looked back more in her chart and I do see that patient had her last injection on 09/01/2022, patient not due for next injection until 03/03/2023. Patient doesn't think Prolia is helping much, she has been taking Prolia for 4-5 years and back in 2021 she had 4 compression fractures of spine, broken leg and broken bones in her feet from a fall in 2021. So she is not sure if Prolia is helping with bone loss or increasing bone structure. Spoke with patient regarding Evenity injection that could benefit her, patient would like more information on this to consider, patient aware I will send her information in the mail regarding Evenity and I also see she has a visit with Arizona Constable, Pharm D on 12/01/2022 and he can also discuss with her about Evenity benefits. Patient aware she will receive reading materials on Evenity next week.    Pattricia Boss, [place] Pharmacist Assistant [phone]

## 2022-11-22 ENCOUNTER — Other Ambulatory Visit: Payer: Self-pay | Admitting: Neurological Surgery

## 2022-11-22 ENCOUNTER — Ambulatory Visit (HOSPITAL_COMMUNITY): Admission: RE | Admit: 2022-11-22 | Payer: Medicare PPO | Source: Home / Self Care | Admitting: Neurological Surgery

## 2022-11-22 ENCOUNTER — Encounter (HOSPITAL_COMMUNITY): Admission: RE | Payer: Self-pay | Source: Home / Self Care

## 2022-11-22 DIAGNOSIS — M48062 Spinal stenosis, lumbar region with neurogenic claudication: Secondary | ICD-10-CM

## 2022-11-22 SURGERY — KYPHOPLASTY
Anesthesia: General | Site: Back

## 2022-11-23 ENCOUNTER — Ambulatory Visit
Admission: RE | Admit: 2022-11-23 | Discharge: 2022-11-23 | Disposition: A | Discharge status: Home / Self Care | Payer: Medicare PPO | Source: Ambulatory Visit | Attending: Neurological Surgery | Admitting: Neurological Surgery

## 2022-11-23 DIAGNOSIS — M48061 Spinal stenosis, lumbar region without neurogenic claudication: Secondary | ICD-10-CM | POA: Diagnosis not present

## 2022-11-23 DIAGNOSIS — R202 Paresthesia of skin: Secondary | ICD-10-CM | POA: Diagnosis not present

## 2022-11-23 DIAGNOSIS — M48062 Spinal stenosis, lumbar region with neurogenic claudication: Secondary | ICD-10-CM

## 2022-11-23 DIAGNOSIS — M8588 Other specified disorders of bone density and structure, other site: Secondary | ICD-10-CM | POA: Diagnosis not present

## 2022-11-23 DIAGNOSIS — M545 Low back pain, unspecified: Secondary | ICD-10-CM | POA: Diagnosis not present

## 2022-11-24 DIAGNOSIS — S301XXA Contusion of abdominal wall, initial encounter: Secondary | ICD-10-CM | POA: Diagnosis not present

## 2022-11-24 DIAGNOSIS — R1084 Generalized abdominal pain: Secondary | ICD-10-CM | POA: Diagnosis not present

## 2022-11-25 ENCOUNTER — Telehealth: Payer: Self-pay

## 2022-11-25 DIAGNOSIS — Z7901 Long term (current) use of anticoagulants: Secondary | ICD-10-CM

## 2022-11-25 DIAGNOSIS — D6859 Other primary thrombophilia: Secondary | ICD-10-CM

## 2022-11-25 DIAGNOSIS — S301XXA Contusion of abdominal wall, initial encounter: Secondary | ICD-10-CM | POA: Diagnosis not present

## 2022-11-25 LAB — PROTIME-INR: Protime: 14.1 — AB (ref 10.0–13.8)

## 2022-11-25 LAB — APTT: APTT: 34.3

## 2022-11-25 LAB — CBC AND DIFFERENTIAL
HCT: 38 (ref 36–46)
Hemoglobin: 12.7 (ref 12.0–16.0)
Neutrophils Absolute: 5.54
Platelets: 289 10*3/uL (ref 150–400)
WBC: 8.8

## 2022-11-25 LAB — FIBRINOGEN: Fibrinogen: 335

## 2022-11-25 LAB — POCT INR: INR: 1.4 — AB (ref 0.80–1.20)

## 2022-11-25 LAB — CBC: RBC: 4.18 (ref 3.87–5.11)

## 2022-11-25 NOTE — Telephone Encounter (Signed)
Pt reports her INR is 1.3. She is taking the Lovenox and warfarin until INR is therapeutic (she had stopped the warfarin for surgery). The surgery didn't take place, as insurance didn't approve. "Yesterday I went to the doctor because I started having pain in abdomen on left side. It was stinging and burning. They did labs and will send those to y'all in next couple of hours. I have a large hematoma (the size of a fist)". Pt denies fall or injury to hip.

## 2022-11-28 ENCOUNTER — Telehealth: Payer: Self-pay

## 2022-11-28 NOTE — Progress Notes (Unsigned)
Name: Pamela [name]  MRN/ DOB: [id number], [birth year]   Age/ Sex: 77 y.o., female    PCP: [name], MD   Reason for Endocrinology Evaluation: Type 2 Diabetes Mellitus     Date of Initial Endocrinology Visit: 03/30/2022    PATIENT IDENTIFIER: [name] is a 77 y.o. female with a past medical history of T2DM, GERD, A. Fib . The patient presented for initial endocrinology clinic visit on 11/29/2022 for consultative assistance with her diabetes management.    HPI: Pamela [name] was    Diagnosed with DM in 1985 Prior Medications tried/Intolerance: Metformin - tired .Ozempic - diarrhea  Rybelsus caused nausea  Hemoglobin A1c has ranged from 7.2% in 2023, peaking at 9.8% in 2022.   Of note, the pt was seen by Dr. Loanne Drilling in 2021 for hyponatremia . Adrenal insufficiency was excluded    She was started on Farxiga in July 2023  SUBJECTIVE:   During the last visit (05/27/2022): A1c 7.6%  Today (11/29/22): Pamela [name] is here for a follow up on diabetes management. She checks her blood sugars multiple times daily through CGM. The patient has had hypoglycemic episodes since the last clinic visit, she is symptomatic   Denies nausea, vomiting or diarrhea  Denies recent UTI's  Was scheduled for back sx 3/19 th but developed a large hematoma on abdominal wall while on Lovenox       HOME DIABETES REGIMEN: Farxiga 10 mg daily Toujeo  8 units daily  Novolog 4 units 3 times daily before every meal Correction factor: NovoLog (BG -120/50)     Statin: yes ACE-I/ARB: yes    CONTINUOUS GLUCOSE MONITORING RECORD INTERPRETATION    Dates of Recording: 3/13-3/26/2024  Sensor description: [employer]  Results statistics:   CGM use % of time 93  Average and SD 154/36.7  Time in range   75 %  % Time Above 180 17  % Time above 250 8  % Time Below target 0   Glycemic patterns summary: BG's has been noted to be optimal overnight with  hyperglycemia noted at supper time   Hyperglycemic episodes post supper  Hypoglycemic episodes occurred variable  Overnight periods: stable with variable hypoglycemia      DIABETIC COMPLICATIONS: Microvascular complications:  Macular degeneration , CKD III Denies:  Last eye exam: Completed 09/2021  Macrovascular complications:   Denies: CAD, PVD, CVA   PAST HISTORY: Past Medical History:  Past Medical History:  Diagnosis Date   Anticoagulated on Coumadin    chronic--- managed by hematology/ oncology,   Asthma, mild intermittent    followed by pcp--- per pt last exacerbation winter 2021 w/ acute bronchitis   Bilateral leg cramps    Blood dyscrasia    Chronic constipation    CKD (chronic kidney disease), stage III (HCC)    Closed bimalleolar fracture of left ankle 02/26/2020   DDD (degenerative disc disease), cervical    w/ spondylosis,  per pt last steroid injection 06/ 2022   DDD (degenerative disc disease), lumbosacral    DVT (deep venous thrombosis) ([place]) 07/30/2013   Dyspnea    occasionally   Dysrhythmia    GERD (gastroesophageal reflux disease)    History of cardiac murmur as a child    History of DVT of lower extremity    left lower extremity in 1980s, fell when bowling   History of pulmonary embolus (PE) 1993   per pt left lung post op 2 wks cholecystectomy   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    per last echo 12-31-2019 no valvular issues   History of TIA (transient ischemic attack)    2014 and 2018 or 2019,  per pt no residual   HTN (hypertension)    followed by pcp   Hyperlipidemia    IDA (iron deficiency anemia)    Macular degeneration of both eyes    Mild obstructive sleep apnea    per pt dx 2017 tried to uses cpap but intolerant   Mixed incontinence urge and stress    urologist--- dr Nila Nephew   OA (osteoarthritis) 07/06/2018   Osteoporosis    PAF (paroxysmal atrial fibrillation) ([place]) 10/01/2014   cardiologist--- dr Godfrey Pick tobb;   cardiac cath  02-18-2013 normal coronaries arteries, ef 50%, cath done since echo showed ef 30-35%; nucleat stress study 04/ 2020 normal , normal echo 04/ 2021,  event monitor 09-14-2020 rare ST/AT variable block   Pneumonia    PONV (postoperative nausea and vomiting)    Protein S deficiency ([place])    followed by hemotology/ oncology-- dr d. Bobby Rumpf ([facility]) dx 1980s;  prior DVT left lower leg 1980s and left lung PE 1993; chronic  coumadin since 1980s   PVC's (premature ventricular contractions)    followed by cardiology   S/P cardiac catheterization 02/2013   Normal coronaries; low normal EF at 50%   Solitary pulmonary nodule on lung CT 02/06/2019   First noted 01/13/2014 > no change as of 12/21/2018   Spondylolisthesis, lumbar region 08/09/2018   Stroke ([place])    TIA in 2018 or 2019   Transient ischemic attack 07/30/2013   Type 2 diabetes mellitus treated with insulin ([place])    endocrilogist--- whitney reardon NP     (03-10-2021  pt continuously checks blood sugar throughout the day w/ Libre, fasting sugar --- 69--200)   Wears glasses    Wears hearing aid in both ears    Past Surgical History:  Past Surgical History:  Procedure Laterality Date   ABDOMINAL HYSTERECTOMY     BLEPHAROPLASTY     CARDIAC CATHETERIZATION  02/18/2013   @ [facility]  by Dr Martinique;  normal coronaries w/ preserved LVF, ef 50%;   previous cath 2001 normal ef 65%   CARPAL TUNNEL RELEASE Bilateral 1994   CATARACT EXTRACTION W/ INTRAOCULAR LENS IMPLANT Bilateral 2017   CHOLECYSTECTOMY     CHOLECYSTECTOMY, LAPAROSCOPIC  1993   COLONOSCOPY     EAR BIOPSY Left    FINGER SURGERY Left 2018   thumb   FOOT SURGERY     FOOT TENDON SURGERY Right    early 2000s   FRACTURE SURGERY Left 02/13/2020   left femur   HAND SURGERY Right 04/2022   HARDWARE REMOVAL Left 03/12/2021   Procedure: HARDWARE REMOVAL;  Surgeon: Nicholes Stairs, MD;  Location: [facility];  Service: Orthopedics;  Laterality: Left;  60  MINS   JOINT REPLACEMENT     ORIF ANKLE FRACTURE Left 02/26/2020   Procedure: OPEN REDUCTION INTERNAL FIXATION (ORIF) ANKLE FRACTURE;  Surgeon: Nicholes Stairs, MD;  Location: [place];  Service: Orthopedics;  Laterality: Left;  90 mins   TONSILLECTOMY AND ADENOIDECTOMY Bilateral    TOTAL KNEE ARTHROPLASTY Left 03/2005   TOTAL VAGINAL HYSTERECTOMY  1988   per pt still has ovaries    Social History:  reports that she has never smoked. She has never used smokeless tobacco. She reports that she does not drink alcohol and does not use drugs. Family History:  Family History  Problem Relation Age of Onset   Cirrhosis Mother    Antithrombin III deficiency Mother        multiple emboli   Ulcerative colitis Mother    Diabetes Father    Coronary artery disease Father    Heart attack Father    Hypertension Father    Kidney disease Father    Hypertension Sister    Diabetes Sister    Heart attack Sister        age 12    Protein S deficiency Daughter    Heart attack Brother    Hypertension Brother    Lung cancer Brother    Stroke Neg Hx    Colitis Neg Hx    Colon polyps Neg Hx    Esophageal cancer Neg Hx    Liver cancer Neg Hx    Pancreatic cancer Neg Hx    Rectal cancer Neg Hx    Stomach cancer Neg Hx    Breast cancer Neg Hx      HOME MEDICATIONS: Allergies as of 11/29/2022       Reactions   Ketek [telithromycin] Nausea And Vomiting, Rash   Loxapine Succinate Hives   Naproxen Sodium Anaphylaxis, Hives   Amoxapine And Related Hives   Darvon Nausea And Vomiting   Belsomra [suvorexant] Other (See Comments)   "Nightmares"   Cymbalta [duloxetine Hcl] Other (See Comments)   "Blackouts"   Duloxetine    Gabapentin Other (See Comments)   Blurry vision   Lyrica [pregabalin]    Makes her sedated   Ozempic (0.25 Or 0.5 Mg-dose) [semaglutide(0.25 Or 0.5mg -dos)] Diarrhea   Semaglutide Diarrhea   Rybelsus   Amoxicillin Rash   Propoxyphene Nausea And Vomiting         Medication List        Accurate as of November 29, 2022 10:17 AM. If you have any questions, ask your nurse or doctor.          acetaminophen 650 MG CR tablet Commonly known as: TYLENOL Take 650-1,300 mg by mouth every 8 (eight) hours as needed for pain.   albuterol 108 (90 Base) MCG/ACT inhaler Commonly known as: VENTOLIN HFA INHALE 1 TO 2 PUFFS INTO THE LUNGS EVERY 6 HOURS AS NEEDED FOR WHEEZING OR SHORTNESS OF BREATH   amLODipine 5 MG tablet Commonly known as: NORVASC TAKE 1 TABLET(5 MG) BY MOUTH DAILY   Aspirin Low Dose 81 MG tablet Generic drug: aspirin EC TAKE 1 TABLET(81 MG) BY MOUTH DAILY   atorvastatin 80 MG tablet Commonly known as: LIPITOR TAKE 1 TABLET(80 MG) BY MOUTH DAILY   BD Pen Needle Nano 2nd Gen 32G X 4 MM Misc Generic drug: Insulin Pen Needle USE DAILY AS DIRECTED   CALCIUM CITRATE + PO Take 1 tablet by mouth daily.   carboxymethylcellulose 0.5 % Soln Commonly known as: REFRESH PLUS Place 1 drop into both eyes 3 (three) times daily as needed (dry eyes).   CINNAMON PO Take 1,000 mg by mouth daily.   clindamycin 300 MG capsule Commonly known as: CLEOCIN Take 300 mg by mouth See admin instructions. 1 hour before dental procedures   cyanocobalamin 1000 MCG tablet Commonly known as: VITAMIN B12 Take 1,000 mcg by mouth daily.   dapagliflozin propanediol 10 MG Tabs tablet Commonly known as: Farxiga Take 1 tablet (10 mg total) by mouth daily before breakfast.   estradiol 0.1 MG/GM vaginal cream Commonly known as: ESTRACE Place 1 Applicatorful vaginally 2 (two) times a week. As needed  ferrous sulfate 325 (65 FE) MG tablet Take 325 mg by mouth daily with lunch.   FreeStyle Libre 2 Sensor Misc 2 each by Does not apply route every 14 (fourteen) days. E11.40   furosemide 40 MG tablet Commonly known as: LASIX Take 1 tablet (40 mg total) by mouth daily.   Gemtesa 75 MG Tabs Generic drug: Vibegron Take 75 mg by mouth at bedtime.   Insulin  Aspart FlexPen 100 UNIT/ML Commonly known as: NOVOLOG Max daily 30 units What changed:  how much to take how to take this when to take this   insulin glargine (1 Unit Dial) 300 UNIT/ML Solostar Pen Commonly known as: TOUJEO Inject 8 Units into the skin at bedtime.   ipratropium-albuterol 0.5-2.5 (3) MG/3ML Soln Commonly known as: DUONEB Take 3 mLs by nebulization 4 (four) times daily as needed (wheezing/shortness of breath).   isosorbide mononitrate 30 MG 24 hr tablet Commonly known as: IMDUR 1 tablet by mouth daily, 2nd attempt, please reach arrange appt for further refills   lisinopril 20 MG tablet Commonly known as: ZESTRIL TAKE 2 TABLETS(40 MG) BY MOUTH TWICE DAILY   magnesium oxide 400 (240 Mg) MG tablet Commonly known as: MAG-OX TAKE 2 TABLETS(800 MG) BY MOUTH TWICE DAILY What changed: See the new instructions.   methocarbamol 750 MG tablet Commonly known as: ROBAXIN Take 750 mg by mouth at bedtime.   MiraLax 17 GM/SCOOP powder Generic drug: polyethylene glycol powder Take 17 g by mouth daily as needed for moderate constipation.   nystatin cream Commonly known as: MYCOSTATIN Apply 1 Application topically 4 (four) times daily as needed for dry skin (yeast infection).   ondansetron 8 MG disintegrating tablet Commonly known as: ZOFRAN-ODT Take 8 mg by mouth every 8 (eight) hours as needed for nausea or vomiting.   pantoprazole 40 MG tablet Commonly known as: PROTONIX TAKE 1 TABLET(40 MG) BY MOUTH EVERY MORNING   PRESERVISION AREDS PO Take 1 capsule by mouth 2 (two) times daily.   Prolia 60 MG/ML Sosy injection Generic drug: denosumab Inject 60 mg into the skin every 6 (six) months.   pyridoxine 100 MG tablet Commonly known as: B-6 Take 100 mg by mouth daily.   sennosides-docusate sodium 8.6-50 MG tablet Commonly known as: SENOKOT-S Take 1 tablet by mouth daily.   sodium chloride 0.65 % Soln nasal spray Commonly known as: OCEAN Place 1 spray into  both nostrils as needed for congestion.   traMADol 50 MG tablet Commonly known as: ULTRAM Take 50-100 mg by mouth every 6 (six) hours as needed.   traZODone 50 MG tablet Commonly known as: DESYREL Take 50 mg by mouth at bedtime.   Vitamin D3 50 MCG (2000 UT) Tabs Generic drug: Cholecalciferol Take 4,000 Units by mouth daily with lunch.         ALLERGIES: Allergies  Allergen Reactions   Ketek [Telithromycin] Nausea And Vomiting and Rash   Loxapine Succinate Hives   Naproxen Sodium Anaphylaxis and Hives   Amoxapine And Related Hives   Darvon Nausea And Vomiting   Belsomra [Suvorexant] Other (See Comments)    "Nightmares"   Cymbalta [Duloxetine Hcl] Other (See Comments)    "Blackouts"   Duloxetine    Gabapentin Other (See Comments)    Blurry vision   Lyrica [Pregabalin]     Makes her sedated   Ozempic (0.25 Or 0.5 Mg-Dose) [Semaglutide(0.25 Or 0.5mg -Dos)] Diarrhea   Semaglutide Diarrhea    Rybelsus   Amoxicillin Rash   Propoxyphene Nausea And Vomiting  REVIEW OF SYSTEMS: A comprehensive ROS was conducted with the patient and is negative except as per HPI     OBJECTIVE:   VITAL SIGNS: BP 126/82 (BP Location: Left Arm, Patient Position: Sitting, Cuff Size: Large)   Pulse 98   Ht 5\' 1"  (1.549 m)   Wt 150 lb (68 kg)   SpO2 96%   BMI 28.34 kg/m    PHYSICAL EXAM:  General: Pt appears well and is in NAD Large hematoma noted at the lower abdominal area  Lungs: Clear with good BS bilat with no rales, rhonchi, or wheezes  Heart: RRR   Extremities:  Lower extremities - No pretibial edema.   Neuro: MS is good with appropriate affect, pt is alert and Ox3     DATA REVIEWED:  Lab Results  Component Value Date   HGBA1C 7.3 (H) 11/16/2022   HGBA1C 7.7 (H) 09/07/2022   HGBA1C 7.6 (A) 05/27/2022    Latest Reference Range & Units 11/16/22 10:49  Sodium 135 - 145 mmol/L 136  Potassium 3.5 - 5.1 mmol/L 4.0  Chloride 98 - 111 mmol/L 97 (L)  CO2 22 - 32 mmol/L  29  Glucose 70 - 99 mg/dL 138 (H)  Mean Plasma Glucose mg/dL 163  BUN 8 - 23 mg/dL 23  Creatinine 0.44 - 1.00 mg/dL 1.20 (H)  Calcium 8.9 - 10.3 mg/dL 9.1  Anion gap 5 - 15  10  GFR, Estimated >60 mL/min 46 (L)    ASSESSMENT / PLAN / RECOMMENDATIONS:   1) Type 2 Diabetes Mellitus, Optimally controlled, With CKD III complications - Most recent A1c of 7.3 %. Goal A1c < 7.5 %.    -Her A1c continues to trend down -She has been noted with tight BG's overnight and occasional hypoglycemia, will reduce Toujeo as below -I will also increase her NovoLog with supper as she seems to have worse hyperglycemia at that time -She was encouraged to continue to use correction scale before each meal -Her most recent BMP, showed improvement in the GFR     MEDICATIONS: Continue Farxiga 10 mg daily Decrease Toujeo 6 units daily Change  NovoLog 4 units with breakfast, 4 units with lunch, and 5 units with supper Continue correction factor : NovoLog (BG -120/50)  EDUCATION / INSTRUCTIONS: BG monitoring instructions: Patient is instructed to check her blood sugars 3 times a day, before meals. Call [facility] if: BG persistently < 70  I reviewed the Rule of 15 for the treatment of hypoglycemia in detail with the patient. Literature supplied.   2) Diabetic complications:  Eye: Does not have known diabetic retinopathy.  Neuro/ Feet: Does not have known diabetic peripheral neuropathy. Renal: Patient does not have known baseline CKD. She is  on an ACEI/ARB at present.   Follow-up in 6 months     Signed electronically by: Mack Guise, MD  [facility] Endocrin[facility]  [facility] [address] [place], [place] [address] Phone: [phone] FAX: [phone]   CC: [name], MD [address] [place] [address] Phone: [phone]  Fax: [phone]    Return to Endocrinology clinic as below: Future Appointments  Date Time  Provider [place]  11/29/2022 10:30 AM Corlette Ciano, Melanie Crazier, MD [facility] None  11/30/2022 10:30 AM [facility] None  11/30/2022 11:00 AM Marice Potter, MD [facility] None  12/01/2022  2:00 PM Lane Hacker, [facility] None  12/12/2022  8:15 AM CFP28-NURSE [facility] None  12/14/2022  9:00 AM  Cox, Kirsten, MD [facility] None  02/06/2023  9:30 AM [facility] None  02/06/2023 10:00 AM Lewis, Dequincy A, MD [facility] None  07/17/2023 10:00 AM [facility] DX DEXA 1 [facility]

## 2022-11-28 NOTE — Progress Notes (Signed)
Care Management & Coordination Services Pharmacy Team  Reason for Encounter: Appointment Reminder  Contacted patient to confirm telephone appointment with Arizona Constable, PharmD on 12/01/22 at 2:00 pm.  Spoke with patient on 11/28/2022   Do you have any problems getting your medications? No  What is your top health concern you would like to discuss at your upcoming visit? Pt has a hematoma on her abdomen and she has been trying to get into a Hematologist, Dr. Hartford Poli at [facility] and she has not been able to get in touch. She wants to discuss trying to get a referral to him. I will go ahead and sent [name] a message because pt is worried about her being taking off all her blood thinners.   Have you seen any other providers since your last visit with PCP? Yes, pt sees her oncologist    Chart review:  Recent office visits:  None  Recent consult visits:  11/25/22 (Oncology) Neena Rhymes, [place] RN. Telephone message. recommended pt stop the warfarin and Lovenox due to hematoma in abdomen from Lovenox.  11/04/22 (Cardiology) Nicholes Rough PA-C. Phone visit. No med changes.   11/01/22 Rosanne Gutting) [place] Planas MD. Seen for arthritis. No med changes.   10/28/22 No notes available.   Hospital visits:  None   Star Rating Drugs:  Medication:  Last Fill: Day Supply Atorvastatin   10/20/22-07/15/22 90ds Farxiga  09/02/22-06/01/22 90ds Lisinopril   10/20/22-07/15/22 90ds   Care Gaps: Annual wellness visit in last year? Yes  If Diabetic: Last eye exam / retinopathy screening:10/21/21 Last diabetic foot exam:09/07/22   Elray Mcgregor, [place] Pharmacist Assistant  [phone]

## 2022-11-29 ENCOUNTER — Encounter: Payer: Self-pay | Admitting: Internal Medicine

## 2022-11-29 ENCOUNTER — Ambulatory Visit (INDEPENDENT_AMBULATORY_CARE_PROVIDER_SITE_OTHER): Payer: Medicare PPO | Admitting: Internal Medicine

## 2022-11-29 VITALS — BP 126/82 | HR 98 | Ht 61.0 in | Wt 150.0 lb

## 2022-11-29 DIAGNOSIS — Z794 Long term (current) use of insulin: Secondary | ICD-10-CM | POA: Diagnosis not present

## 2022-11-29 DIAGNOSIS — N1832 Chronic kidney disease, stage 3b: Secondary | ICD-10-CM | POA: Diagnosis not present

## 2022-11-29 DIAGNOSIS — E1140 Type 2 diabetes mellitus with diabetic neuropathy, unspecified: Secondary | ICD-10-CM

## 2022-11-29 DIAGNOSIS — E1122 Type 2 diabetes mellitus with diabetic chronic kidney disease: Secondary | ICD-10-CM | POA: Diagnosis not present

## 2022-11-29 NOTE — Patient Instructions (Addendum)
Continue  farxiga 10 mg , 1 tablet every morning  Decrease Toujeo 6 units once daily  Continue Novolog 4 units with Breakfast, 4 units with Lunch and 5 units with Supper  Novolog correctional insulin: ADD extra units on insulin to your meal-time Novolog dose if your blood sugars are higher than 180. Use the scale below to help guide you:   Blood sugar before meal Number of units to inject  Less than 170 0 unit  171 -  220 1 units  221 -  270 2 units  271 -  320 3 units  321 -  370 4 units  371 -  420 5 units  421 -  470 6 units      HOW TO TREAT LOW BLOOD SUGARS (Blood sugar LESS THAN 70 MG/DL) Please follow the RULE OF 15 for the treatment of hypoglycemia treatment (when your (blood sugars are less than 70 mg/dL)   STEP 1: Take 15 grams of carbohydrates when your blood sugar is low, which includes:  3-4 GLUCOSE TABS  OR 3-4 OZ OF JUICE OR REGULAR SODA OR ONE TUBE OF GLUCOSE GEL    STEP 2: RECHECK blood sugar in 15 MINUTES STEP 3: If your blood sugar is still low at the 15 minute recheck --> then, go back to STEP 1 and treat AGAIN with another 15 grams of carbohydrates.

## 2022-11-30 ENCOUNTER — Other Ambulatory Visit: Payer: Self-pay | Admitting: Oncology

## 2022-11-30 ENCOUNTER — Inpatient Hospital Stay: Payer: Medicare PPO | Attending: Oncology

## 2022-11-30 ENCOUNTER — Telehealth: Payer: Self-pay

## 2022-11-30 ENCOUNTER — Inpatient Hospital Stay: Payer: Medicare PPO | Admitting: Oncology

## 2022-11-30 ENCOUNTER — Inpatient Hospital Stay (INDEPENDENT_AMBULATORY_CARE_PROVIDER_SITE_OTHER): Payer: Medicare PPO | Admitting: Oncology

## 2022-11-30 VITALS — BP 157/69 | HR 63 | Temp 97.6°F | Resp 18 | Ht 61.0 in | Wt 150.8 lb

## 2022-11-30 DIAGNOSIS — D6859 Other primary thrombophilia: Secondary | ICD-10-CM | POA: Diagnosis not present

## 2022-11-30 DIAGNOSIS — N189 Chronic kidney disease, unspecified: Secondary | ICD-10-CM | POA: Diagnosis not present

## 2022-11-30 DIAGNOSIS — Z7901 Long term (current) use of anticoagulants: Secondary | ICD-10-CM

## 2022-11-30 DIAGNOSIS — S301XXA Contusion of abdominal wall, initial encounter: Secondary | ICD-10-CM | POA: Diagnosis not present

## 2022-11-30 DIAGNOSIS — D631 Anemia in chronic kidney disease: Secondary | ICD-10-CM | POA: Diagnosis not present

## 2022-11-30 DIAGNOSIS — N3289 Other specified disorders of bladder: Secondary | ICD-10-CM | POA: Diagnosis not present

## 2022-11-30 DIAGNOSIS — I700 Atherosclerosis of aorta: Secondary | ICD-10-CM | POA: Diagnosis not present

## 2022-11-30 DIAGNOSIS — T148XXA Other injury of unspecified body region, initial encounter: Secondary | ICD-10-CM

## 2022-11-30 LAB — PROTIME-INR
INR: 1.1 (ref 0.8–1.2)
Prothrombin Time: 13.6 seconds (ref 11.4–15.2)

## 2022-11-30 NOTE — Telephone Encounter (Signed)
Unable to reach patient.

## 2022-11-30 NOTE — Progress Notes (Incomplete)
[facility]  [address] [place],  [place]  [address] [phone]  Clinic Day:  11/30/2022  Referring physician: [name], MD   HISTORY OF PRESENT ILLNESS:  The patient is a 77 y.o. female who I follow for mild anemia.  As her hemoglobin has never been particularly low, it has been followed conservatively.  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  She comes in today for annual follow-up.  Since her last visit, the patient has been doing fairly well.  She denies having increased fatigue or any overt forms of blood loss which concern her for progressive anemia.  She also claims to be therapeutic on her current Coumadin dose.  She denies having symptoms which suggest she has had any recent blood clots.   PHYSICAL EXAM:  There were no vitals taken for this visit. Wt Readings from Last 3 Encounters:  11/29/22 150 lb (68 kg)  11/16/22 151 lb (68.5 kg)  09/07/22 155 lb 12.8 oz (70.7 kg)   There is no height or weight on file to calculate BMI. Performance status (ECOG): 1 - Symptomatic but completely ambulatory Physical Exam Constitutional:      Appearance: Normal appearance. She is not ill-appearing.  HENT:     Mouth/Throat:     Mouth: Mucous membranes are moist.     Pharynx: Oropharynx is clear. No oropharyngeal exudate or posterior oropharyngeal erythema.  Cardiovascular:     Rate and Rhythm: Normal rate and regular rhythm.     Heart sounds: No murmur heard.    No friction rub. No gallop.  Pulmonary:     Effort: Pulmonary effort is normal. No respiratory distress.     Breath sounds: Normal breath sounds. No wheezing, rhonchi or rales.  Abdominal:     General: Bowel sounds are normal. There is no distension.     Palpations: Abdomen is soft. There is no mass.     Tenderness: There is no abdominal tenderness.  Musculoskeletal:        General: No swelling.     Right  lower leg: No edema.     Left lower leg: No edema.  Lymphadenopathy:     Cervical: No cervical adenopathy.     Upper Body:     Right upper body: No supraclavicular or axillary adenopathy.     Left upper body: No supraclavicular or axillary adenopathy.     Lower Body: No right inguinal adenopathy. No left inguinal adenopathy.  Skin:    General: Skin is warm.     Coloration: Skin is not jaundiced.     Findings: No lesion or rash.  Neurological:     General: No focal deficit present.     Mental Status: She is alert and oriented to person, place, and time. Mental status is at baseline.  Psychiatric:        Mood and Affect: Mood normal.        Behavior: Behavior normal.        Thought Content: Thought content normal.     LABS:      Latest Ref Rng & Units 11/25/2022   12:00 AM 11/16/2022   10:49 AM 09/07/2022   10:00 AM  CBC  WBC  8.8     9.6  9.2   Hemoglobin 12.0 - 16.0 12.7     13.9  12.7   Hematocrit 36 - 46 38  41.4  39.2   Platelets 150 - 400 K/uL 289     285  398      This result is from an external source.       Latest Ref Rng & Units 11/16/2022   10:49 AM 09/07/2022   10:00 AM 08/04/2022    6:08 PM  CMP  Glucose 70 - 99 mg/dL 138  99  168   BUN 8 - 23 mg/dL 23  31  34   Creatinine 0.44 - 1.00 mg/dL 1.20  1.13  1.44   Sodium 135 - 145 mmol/L 136  135  134   Potassium 3.5 - 5.1 mmol/L 4.0  4.1  4.0   Chloride 98 - 111 mmol/L 97  100  96   CO2 22 - 32 mmol/L 29  20  24    Calcium 8.9 - 10.3 mg/dL 9.1  8.2  9.7   Total Protein 6.0 - 8.5 g/dL  6.5  7.4   Total Bilirubin 0.0 - 1.2 mg/dL  0.4  0.5   Alkaline Phos 44 - 121 IU/L  139  84   AST 0 - 40 IU/L  23  29   ALT 0 - 32 IU/L  24  25    Lab Results  Component Value Date   TIBC 315 02/03/2022   FERRITIN 92 02/03/2022   IRONPCTSAT 24 02/03/2022    ASSESSMENT & PLAN:  Assessment/Plan:  A 77 y.o. female with protein S deficiency, as well as a history of mild anemia.  I am very pleased with her counts today as  her hemoglobin is ideal.  Furthermore, her iron parameters are normal.  As mentioned previously, she remains on her home Coumadin program which has her INR consistently at a therapeutic level.  Clinically, she continues to do very well.  Per her request, I will see her back in 1 year for repeat clinical assessment.  The patient understands all the plans discussed today and is in agreement with them.    Mayra Brahm Macarthur Critchley, MD

## 2022-11-30 NOTE — Telephone Encounter (Signed)
Pt called and requesting the results of the CT scan she had today. I notified Dr Bobby Rumpf @ [phone].

## 2022-11-30 NOTE — Telephone Encounter (Signed)
-----   Message from [name], MD sent at 11/30/2022 12:06 AM EDT ----- Regarding: FW: Referral Has patient been seen for this?  I would recommend an appt. Has she seen Dr .Hartford Poli previously? Dr Royal Hawthorn is excellent and is at the [facility]. [name]   ----- Message ----- From: Arsenio Katz, CMA Sent: 11/28/2022  11:26 AM EDT To: [name], MD Subject: RE: Referral                                    ----- Message ----- From: Eulis Canner Sent: 11/28/2022  10:40 AM EDT To: [facility] Subject: Referral                                       Good morning,  Pt has a hematoma on her abdomen and she has been trying to get into a Hematologist, Dr. Hartford Poli at [facility] and she has not been able to get in touch. She wants to discuss trying to get a referral to him. She is very worried about being taking off all her blood thinners. Please advise   Elray Mcgregor, [facility] Pharmacist Assistant  [phone]

## 2022-11-30 NOTE — Progress Notes (Signed)
[facility]  [address] [place],  [place]  [address] [phone]  Clinic Day:  11/30/2022  Referring physician: [name], MD   HISTORY OF PRESENT ILLNESS:  The patient is a 77 y.o. female who I follow for mild anemia.  As her hemoglobin has never been particularly low, it has been followed conservatively.  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  She comes in today off schedule due to hematomas that developed over her abdomen.  This patient was scheduled for back surgery approximately 2 weeks ago.  However, her insurance ultimately elected not to cover this surgery.  In preparation for back surgery the patient was switched from Coumadin to Lovenox.  While taking Lovenox, recommend bruising developed over her entire lower abdomen in the areas where she gave herself Lovenox shots.  I was contacted by physician assistant last week who said 1 hematoma was not as great as 20 cm.  Based upon this, I recommended that all anticoagulation be held.  My concern was her risk of bleeding or outweighs her risk of clotting from her underlying protein S deficiency.  She essentially comes in today throughout her entire clinical picture reassess.  The patient continues to complain of lower abdominal pain at the site where her Lovenox injections were given.  PHYSICAL EXAM:  Blood pressure (!) 157/69, pulse 63, temperature 97.6 F (36.4 C), resp. rate 18, height 5\' 1"  (1.549 m), weight 150 lb 12.8 oz (68.4 kg), SpO2 95 %. Wt Readings from Last 3 Encounters:  11/30/22 150 lb 12.8 oz (68.4 kg)  11/29/22 150 lb (68 kg)  11/16/22 151 lb (68.5 kg)   Body mass index is 28.49 kg/m. Performance status (ECOG): 1 - Symptomatic but completely ambulatory Physical Exam Constitutional:      Appearance: Normal appearance. She is not ill-appearing.     Comments: She is ambulating with a cane.  She is sore  from her abdominal discomfort.  HENT:     Mouth/Throat:     Mouth: Mucous membranes are moist.     Pharynx: Oropharynx is clear. No oropharyngeal exudate or posterior oropharyngeal erythema.  Cardiovascular:     Rate and Rhythm: Normal rate and regular rhythm.     Heart sounds: No murmur heard.    No friction rub. No gallop.  Pulmonary:     Effort: Pulmonary effort is normal. No respiratory distress.     Breath sounds: Normal breath sounds. No wheezing, rhonchi or rales.  Abdominal:     General: Bowel sounds are normal. There is no distension. There are signs of injury.     Palpations: Abdomen is soft. There is no mass.     Tenderness: There is abdominal tenderness in the right lower quadrant and left lower quadrant.     Comments: Multiple areas of violaceous skin discoloration and subcutaneous fullness can be appreciated throughout both her lower abdominal quadrants, which are highly concerning for multiple large hematomas.  Musculoskeletal:        General: No swelling.     Right lower leg: No edema.     Left lower leg: No edema.  Lymphadenopathy:     Cervical: No cervical adenopathy.     Upper Body:     Right upper body: No supraclavicular or axillary adenopathy.     Left upper body: No supraclavicular or axillary adenopathy.     Lower Body: No right  inguinal adenopathy. No left inguinal adenopathy.  Skin:    General: Skin is warm.     Coloration: Skin is not jaundiced.     Findings: No lesion or rash.  Neurological:     General: No focal deficit present.     Mental Status: She is alert and oriented to person, place, and time. Mental status is at baseline.  Psychiatric:        Mood and Affect: Mood normal.        Behavior: Behavior normal.        Thought Content: Thought content normal.     LABS:      Latest Ref Rng & Units 11/25/2022   12:00 AM 11/16/2022   10:49 AM 09/07/2022   10:00 AM  CBC  WBC  8.8     9.6  9.2   Hemoglobin 12.0 - 16.0 12.7     13.9  12.7    Hematocrit 36 - 46 38     41.4  39.2   Platelets 150 - 400 K/uL 289     285  398      This result is from an external source.      Latest Ref Rng & Units 11/16/2022   10:49 AM 09/07/2022   10:00 AM 08/04/2022    6:08 PM  CMP  Glucose 70 - 99 mg/dL 138  99  168   BUN 8 - 23 mg/dL 23  31  34   Creatinine 0.44 - 1.00 mg/dL 1.20  1.13  1.44   Sodium 135 - 145 mmol/L 136  135  134   Potassium 3.5 - 5.1 mmol/L 4.0  4.1  4.0   Chloride 98 - 111 mmol/L 97  100  96   CO2 22 - 32 mmol/L 29  20  24    Calcium 8.9 - 10.3 mg/dL 9.1  8.2  9.7   Total Protein 6.0 - 8.5 g/dL  6.5  7.4   Total Bilirubin 0.0 - 1.2 mg/dL  0.4  0.5   Alkaline Phos 44 - 121 IU/L  139  84   AST 0 - 40 IU/L  23  29   ALT 0 - 32 IU/L  24  25    Lab Results  Component Value Date   TIBC 315 02/03/2022   FERRITIN 92 02/03/2022   IRONPCTSAT 24 02/03/2022    ASSESSMENT & PLAN:  Assessment/Plan:  A 77 y.o. female with protein S deficiency, as well as a history of mild anemia.  Based upon her physical exam today, I am very concerned with her abdomen.  I do believe she has multiple, very large hematomas.  I will get a CT angiogram of her abdomen today to get a better visualization of these hematomas; I also want to ensure she is not bleeding into her abdomen.  I explained to the patient that although she has protein S deficiency, I am very worried that she has much more of an increased risk of bleeding than clotting risk to where I want her to hold all forms of anticoagulation until the status of her hematomas are better elucidated.  When her hematomas have subsided to where there is a good window to restart her anticoagulation for her protein S deficiency, it will be done.  I will notify her of her CT scan results as soon as they become available.  Otherwise, I will see her back in 2 weeks for repeat clinical assessment.  The patient understands all the plans discussed  today and is in agreement with them.    Trask Vosler Macarthur Critchley, MD

## 2022-12-01 ENCOUNTER — Ambulatory Visit: Payer: Medicare PPO

## 2022-12-01 NOTE — Telephone Encounter (Addendum)
Patient stated she seen Dr. Bobby Rumpf yesterday and he did a CT, and he has took her off all her blood thinners. She is schedule to see him again on Monday 12/05/2022. Patient stated she is having a little head she think maybe from CT contrast yesterday, recommended patient drink plenty of fluids.

## 2022-12-01 NOTE — Patient Outreach (Signed)
Care Management & Coordination Services Pharmacy Note  12/01/2022 Name:  Pamela [name] MRN:  [id number] DOB:  [birth year]   Recommendations/Changes made from today's visit: -Main purpose of today's visit was to discuss possibility of Denosomab. Patient was interested in starting that but had questions. With all patient's current CARDIO issues right now, this medication would most likely be contraindicated. Counseled patient on risks and she agreed to maintain status quo for now -Patient has large Hematoma from Enoxaparin. Provider told her to stop all anticoagulation. She is very nervous about this. Has f/u with Cardio on Monday. Will co-sign PCP as heads up for their f/u on the 10th  Subjective: Pamela [name] is an 77 y.o. year old female who is a primary patient of Cox, Kirsten, MD.  The care coordination team was consulted for assistance with disease management and care coordination needs.    Engaged with patient by telephone for follow up visit.  Recent office visits:  None   Recent consult visits:  11/25/22 (Oncology) Neena Rhymes, Amy RN. Telephone message. recommended pt stop the warfarin and Lovenox due to hematoma in abdomen from Lovenox.   11/04/22 (Cardiology) Nicholes Rough PA-C. Phone visit. No med changes.    11/01/22 Rosanne Gutting) Iran Planas MD. Seen for arthritis. No med changes.    10/28/22 No notes available.    Hospital visits:  None   Objective:  Lab Results  Component Value Date   CREATININE 1.20 (H) 11/16/2022   BUN 23 11/16/2022   GFR 57.98 (L) 03/10/2020   EGFR 50 (L) 09/07/2022   GFRNONAA 46 (L) 11/16/2022   GFRAA 70 09/11/2020   NA 136 11/16/2022   K 4.0 11/16/2022   CALCIUM 9.1 11/16/2022   CO2 29 11/16/2022   GLUCOSE 138 (H) 11/16/2022    Lab Results  Component Value Date/Time   HGBA1C 7.3 (H) 11/16/2022 10:49 AM   HGBA1C 7.7 (H) 09/07/2022 10:00 AM   GFR 57.98 (L) 03/10/2020 04:46 PM   GFR 61.14 02/05/2019 12:26  PM   MICROALBUR 10 06/15/2021 04:46 PM   MICROALBUR 10 07/29/2020 12:01 PM    Last diabetic Eye exam:  Lab Results  Component Value Date/Time   HMDIABEYEEXA No Retinopathy 10/21/2021 12:00 AM    Last diabetic Foot exam: No results found for: "HMDIABFOOTEX"   Lab Results  Component Value Date   CHOL 138 09/07/2022   HDL 76 09/07/2022   LDLCALC 49 09/07/2022   TRIG 60 09/07/2022   CHOLHDL 1.8 09/07/2022       Latest Ref Rng & Units 09/07/2022   10:00 AM 08/04/2022    6:08 PM 08/02/2022   10:34 AM  Hepatic Function  Total Protein 6.0 - 8.5 g/dL 6.5  7.4  6.8   Albumin 3.8 - 4.8 g/dL 4.2  3.8  3.9   AST 0 - 40 IU/L 23  29  23    ALT 0 - 32 IU/L 24  25  24    Alk Phosphatase 44 - 121 IU/L 139  84  98   Total Bilirubin 0.0 - 1.2 mg/dL 0.4  0.5  0.5     Lab Results  Component Value Date/Time   TSH 1.480 05/02/2022 10:13 AM   TSH 2.510 10/18/2021 08:55 AM   FREET4 1.23 01/21/2013 12:47 PM       Latest Ref Rng & Units 11/25/2022   12:00 AM 11/16/2022   10:49 AM 09/07/2022   10:00 AM  CBC  WBC  8.8     9.6  9.2   Hemoglobin 12.0 - 16.0 12.7     13.9  12.7   Hematocrit 36 - 46 38     41.4  39.2   Platelets 150 - 400 K/uL 289     285  398      This result is from an external source.    No results found for: "VD25OH", "VITAMINB12"  Clinical ASCVD: Yes  The ASCVD Risk score (Arnett DK, et al., 2019) failed to calculate for the following reasons:   The patient has a prior MI or stroke diagnosis    Other: (CHADS2VASc if Afib, MMRC or CAT for COPD, ACT, DEXA)     02/15/2022    1:16 PM 01/24/2022    8:21 AM 11/16/2021   11:37 AM  Depression screen PHQ 2/9  Decreased Interest 0 0 0  Down, Depressed, Hopeless 0 0 0  PHQ - 2 Score 0 0 0     Social History   Tobacco Use  Smoking Status Never  Smokeless Tobacco Never   BP Readings from Last 3 Encounters:  11/30/22 (!) 157/69  11/29/22 126/82  11/16/22 (!) 140/68   Pulse Readings from Last 3 Encounters:  11/30/22 63   11/29/22 98  11/16/22 (!) 41   Wt Readings from Last 3 Encounters:  11/30/22 150 lb 12.8 oz (68.4 kg)  11/29/22 150 lb (68 kg)  11/16/22 151 lb (68.5 kg)   BMI Readings from Last 3 Encounters:  11/30/22 28.49 kg/m  11/29/22 28.34 kg/m  11/16/22 28.53 kg/m    Allergies  Allergen Reactions   Ketek [Telithromycin] Nausea And Vomiting and Rash   Loxapine Succinate Hives   Naproxen Sodium Anaphylaxis and Hives   Amoxapine And Related Hives   Darvon Nausea And Vomiting   Belsomra [Suvorexant] Other (See Comments)    "Nightmares"   Cymbalta [Duloxetine Hcl] Other (See Comments)    "Blackouts"   Duloxetine    Gabapentin Other (See Comments)    Blurry vision   Lyrica [Pregabalin]     Makes her sedated   Ozempic (0.25 Or 0.5 Mg-Dose) [Semaglutide(0.25 Or 0.5mg -Dos)] Diarrhea   Semaglutide Diarrhea    Rybelsus   Amoxicillin Rash   Propoxyphene Nausea And Vomiting    Medications Reviewed Today     Reviewed by Velora Heckler, CMA (Certified Medical Assistant) on 11/30/22 at 1116  Med List Status: <None>   Medication Order Taking? Sig Documenting Provider Last Dose Status Informant  acetaminophen (TYLENOL) 650 MG CR tablet [id number] No Take 650-1,300 mg by mouth every 8 (eight) hours as needed for pain. [provider] Taking Active Self  albuterol (VENTOLIN HFA) 108 (90 Base) MCG/ACT inhaler [id number] No INHALE 1 TO 2 PUFFS INTO THE LUNGS EVERY 6 HOURS AS NEEDED FOR WHEEZING OR SHORTNESS OF BREATH Marge Duncans, PA-C Taking Active Self  amLODipine (NORVASC) 5 MG tablet [id number] No TAKE 1 TABLET(5 MG) BY MOUTH DAILY Tobb, Kardie, DO Taking Active Self  aspirin EC (ASPIRIN LOW DOSE) 81 MG tablet [id number] No TAKE 1 TABLET(81 MG) BY MOUTH DAILY Tobb, Kardie, DO Taking Active Self  atorvastatin (LIPITOR) 80 MG tablet [id number] No TAKE 1 TABLET(80 MG) BY MOUTH DAILY Tobb, Kardie, DO Taking Active Self  Calcium Citrate-Vitamin D (CALCIUM CITRATE + PO) [id number] No Take 1  tablet by mouth daily. [provider] Taking Active Self  carboxymethylcellulose (REFRESH PLUS) 0.5 % SOLN [id number] No Place 1 drop into both eyes 3 (three) times daily as needed (dry eyes). [provider] Taking  Active Self  Cholecalciferol (VITAMIN D3) 50 MCG (2000 UT) TABS [id number] No Take 4,000 Units by mouth daily with lunch. [provider] Taking Active Self  CINNAMON PO [id number] No Take 1,000 mg by mouth daily. [provider] Taking Active Self  clindamycin (CLEOCIN) 300 MG capsule [id number] No Take 300 mg by mouth See admin instructions. 1 hour before dental procedures [provider] Taking Active Self  Continuous Blood Gluc Sensor (FREESTYLE LIBRE 2 SENSOR) MISC [id number] No 2 each by Does not apply route every 14 (fourteen) days. E11.40 CoxElnita Maxwell, MD Taking Active Self  cyanocobalamin (VITAMIN B12) 1000 MCG tablet [id number] No Take 1,000 mcg by mouth daily. [provider] Taking Active Self  dapagliflozin propanediol (FARXIGA) 10 MG TABS tablet [id number] No Take 1 tablet (10 mg total) by mouth daily before breakfast. Shamleffer, Melanie Crazier, MD Taking Active Self  denosumab (PROLIA) 60 MG/ML SOSY injection [id number] No Inject 60 mg into the skin every 6 (six) months. [provider] Taking Active Self  estradiol (ESTRACE) 0.1 MG/GM vaginal cream [id number] No Place 1 Applicatorful vaginally 2 (two) times a week. As needed [provider] Taking Active Self           Med Note Merceda Elks   Mon Nov 14, 2022  1:37 PM)    ferrous sulfate 325 (65 FE) MG tablet [id number] No Take 325 mg by mouth daily with lunch. [provider] Taking Active Self  furosemide (LASIX) 40 MG tablet [id number] No Take 1 tablet (40 mg total) by mouth daily. Tobb, Kardie, DO Taking Active Self  GEMTESA 75 MG TABS [id number] No Take 75 mg by mouth at bedtime. [provider] Taking Active Self  Insulin Aspart  FlexPen (NOVOLOG) 100 UNIT/ML [id number] No Max daily 30 units  Patient taking differently: Inject 4-6 Units into the skin 3 (three) times daily with meals. Max daily 30 units   Shamleffer, Melanie Crazier, MD Taking Active Self  insulin glargine, 1 Unit Dial, (TOUJEO) 300 UNIT/ML Solostar Pen [id number] No Inject 8 Units into the skin at bedtime. Shamleffer, Melanie Crazier, MD Taking Active Self  Insulin Pen Needle (BD PEN NEEDLE NANO 2ND GEN) 32G X 4 MM MISC [id number] No USE DAILY AS DIRECTED Cox, Kirsten, MD Taking Active Self  ipratropium-albuterol (DUONEB) 0.5-2.5 (3) MG/3ML SOLN [id number] No Take 3 mLs by nebulization 4 (four) times daily as needed (wheezing/shortness of breath). [provider] Taking Active Self  isosorbide mononitrate (IMDUR) 30 MG 24 hr tablet [id number] No 1 tablet by mouth daily, 2nd attempt, please reach arrange appt for further refills Tobb, Kardie, DO Taking Active Self  lisinopril (ZESTRIL) 20 MG tablet [id number] No TAKE 2 TABLETS(40 MG) BY MOUTH TWICE DAILY Tobb, Kardie, DO Taking Active Self  magnesium oxide (MAG-OX) 400 (240 Mg) MG tablet [id number] No TAKE 2 TABLETS(800 MG) BY MOUTH TWICE DAILY  Patient taking differently: Take 800 mg by mouth 2 (two) times daily.   Lillard Anes, MD Taking Active Self  methocarbamol (ROBAXIN) 750 MG tablet [id number] No Take 750 mg by mouth at bedtime. [provider] Taking Active Self  Multiple Vitamins-Minerals (PRESERVISION AREDS PO) [id number] No Take 1 capsule by mouth 2 (two) times daily. [provider] Taking Active   nystatin cream (MYCOSTATIN) [id number] No Apply 1 Application topically 4 (four) times daily as needed for dry skin (yeast infection). [provider] Taking Active Self  ondansetron (ZOFRAN-ODT) 8 MG disintegrating tablet [id number] No Take 8 mg by mouth  every 8 (eight) hours as needed for nausea or vomiting.  [provider] Taking Active Self  pantoprazole  (PROTONIX) 40 MG tablet [id number] No TAKE 1 TABLET(40 MG) BY MOUTH EVERY MORNING Cox, Kirsten, MD Taking Active Self  polyethylene glycol powder (MIRALAX) 17 GM/SCOOP powder [id number] No Take 17 g by mouth daily as needed for moderate constipation. [provider] Taking Active Self  pyridoxine (B-6) 100 MG tablet [id number] No Take 100 mg by mouth daily. [provider] Taking Active Self  sennosides-docusate sodium (SENOKOT-S) 8.6-50 MG tablet [id number] No Take 1 tablet by mouth daily. [provider] Taking Active Self  sodium chloride (OCEAN) 0.65 % SOLN nasal spray [id number] No Place 1 spray into both nostrils as needed for congestion. [provider] Taking Active Self  traMADol (ULTRAM) 50 MG tablet [id number] No Take 50-100 mg by mouth every 6 (six) hours as needed. [provider] Taking Active   traZODone (DESYREL) 50 MG tablet [id number] No Take 50 mg by mouth at bedtime. [provider] Taking Active Self            SDOH:  (Social Determinants of Health) assessments and interventions performed: Yes SDOH Interventions    Flowsheet Row Care Coordination from 12/01/2022 in [place] Management from 05/25/2022 in [place] Management from 03/23/2022 in [place]  SDOH Interventions     Transportation Interventions Intervention Not Indicated -- Intervention Not Indicated  Financial Strain Interventions Intervention Not Indicated Intervention Not Indicated Intervention Not Indicated  Physical Activity Interventions -- Intervention Not Indicated --       Name and location of Current pharmacy:  [facility] [place], [place] AT [address] [address] Phone: [phone] Fax: [phone]  [facility], [facility] [address] [place] [address] Phone: [phone] Fax: [phone]  [facility] [address] [place] AT [facility] [address] [place] [address] Phone: [phone] Fax: [phone]  [place] [address] [place] [address] Phone: [phone] Fax: [phone]   Compliance/Adherence/Medication fill history: Star Rating Drugs:  Medication:                Last Fill:         Day Supply Atorvastatin                 10/20/22-07/15/22 90ds Farxiga                        09/02/22-06/01/22 90ds Lisinopril                      10/20/22-07/15/22 90ds     Care Gaps: Annual wellness visit in last year? Yes   If Diabetic: Last eye exam / retinopathy screening:10/21/21 Last diabetic foot exam:09/07/22   Assessment/Plan   Hypertension (BP goal <130/80) BP Readings from Last 3 Encounters:  11/30/22 (!) 157/69  11/29/22 126/82  11/16/22 (!) 140/68    Pulse Readings from Last 3 Encounters:  11/30/22 63  11/29/22 98  11/16/22 (!) 41  -Controlled -Current treatment: Lisinopril 20mg  Appropriate, Effective, Safe, Accessible Amlodipine 5mg  Appropriate, Effective, Safe, Accessible Furosemide 40  mg QD Appropriate, Effective, Safe, Accessible -Medications previously tried:  valsartan -Current home readings:  July 2023: Didn't have list on her -Current dietary habits:              Breakfast: Boiled egg, piece of toast, and coffee Lunch: Salad and a few crackers for  Dinner: 2 Small slices of pizza yesterday -Salsbury streak, 1/3 cup hashbrowns, lettuce/tomato salad, and iced tea (unsweet) -Current exercise habits: limited exercise  -Denies hypotensive/hypertensive symptoms -Educated on BP goals and benefits of medications for prevention of heart attack, stroke and kidney damage; Daily salt intake goal < 2300 mg; Importance of home blood pressure  monitoring; -Counseled to monitor BP at home daily, document, and provide log at future appointments -Counseled on diet and exercise extensively Recommended to continue therapy   Hyperlipidemia: (LDL goal < 70) The ASCVD Risk score (Arnett DK, et al., 2019) failed to calculate for the following reasons:   The patient has a prior MI or stroke diagnosis Lab Results  Component Value Date   CHOL 138 09/07/2022   CHOL 143 05/02/2022   CHOL 186 01/24/2022   Lab Results  Component Value Date   HDL 76 09/07/2022   HDL 70 05/02/2022   HDL 59 01/24/2022   Lab Results  Component Value Date   LDLCALC 49 09/07/2022   LDLCALC 57 05/02/2022   LDLCALC 111 (H) 01/24/2022   Lab Results  Component Value Date   TRIG 60 09/07/2022   TRIG 86 05/02/2022   TRIG 87 01/24/2022   Lab Results  Component Value Date   CHOLHDL 1.8 09/07/2022   CHOLHDL 2.0 05/02/2022   CHOLHDL 3.2 01/24/2022   No results found for: "LDLDIRECT" Last vitamin D No results found for: "25OHVITD2", "25OHVITD3", "VD25OH" Lab Results  Component Value Date   TSH 1.480 05/02/2022   -Controlled -Current treatment: atorvastatin 80 mg daily Appropriate, Effective, Safe, Accessible ASA 81mg  Appropriate, Effective, Safe, Accessible -Medications previously tried: none reported  -Current dietary patterns: working on stabilizing blood sugar  -Current exercise habits: limited due to pain -Educated on Cholesterol goals;  Benefits of statin for ASCVD risk reduction; -Counseled on diet and exercise extensively Recommended to continue current medication   Diabetes (A1c goal <7%) Lab Results  Component Value Date   HGBA1C 7.3 (H) 11/16/2022   HGBA1C 7.7 (H) 09/07/2022   HGBA1C 7.6 (A) 05/27/2022   Lab Results  Component Value Date   MICROALBUR 10 06/15/2021   LDLCALC 49 09/07/2022   CREATININE 1.20 (H) 11/16/2022    Lab Results  Component Value Date   NA 136 11/16/2022   K 4.0 11/16/2022   CREATININE 1.20 (H)  11/16/2022   EGFR 50 (L) 09/07/2022   GFRNONAA 46 (L) 11/16/2022   GLUCOSE 138 (H) 11/16/2022    Lab Results  Component Value Date   WBC 8.8 11/25/2022   HGB 12.7 11/25/2022   HCT 38 11/25/2022   MCV 91.6 11/16/2022   PLT 289 11/25/2022    Lab Results  Component Value Date   LABMICR 16.0 09/07/2022   LABMICR 20.7 10/18/2021   MICROALBUR 10 06/15/2021   MICROALBUR 10 07/29/2020  -Controlled -Current medications: freestyle libre 2  novolog units before 4 Breakfast, 4 Lunch, 5 Supper Appropriate, Effective, Safe, Accessible Toujeo 6 units daily Appropriate, Effective, Safe, Accessible Farxiga 10mg  Appropriate, Effective, Safe, Accessible -Medications previously tried: trulicity, metformin, actoplus met, Semaglutide (Had reaction to it in March but wouldn't say what) -Current home glucose readings Glucose: (See Libre for more) 03/21/22:  179, 242, 325, 245, 174, 245, 212, 253 03/20/22: 131, 245, 355, 143, 244, 313 05/24/22: 92-254 05/23/22: 64-274 05/22/22: 94-247 05/20/22: 65 (Low) 05/18/22: 68 (Low) -Reports hypoglycemic/hyperglycemic symptoms -Current dietary habits:              July 2023: Breakfast: Boiled egg, piece of toast, and coffee Lunch: Salad and a few crackers for  Dinner: 2 Small slices of pizza yesterday -[place] streak, 1/3 cup hashbrowns, lettuce/tomato salad, and iced tea (unsweet) -Current exercise: limited -Educated on A1c and blood sugar goals; Complications of diabetes including kidney damage, retinal damage, and cardiovascular disease; Prevention and management of hypoglycemic episodes; Continuous glucose monitoring; Carbohydrate counting and/or plate method -Counseled to check feet daily and get yearly eye exams -Counseled on diet and exercise extensively July 2023: Sugars are good in AM and then high starting around 1000. Looked up log on [facility] site. Will let Endo know. Patient states she called Endo but they only told her to adjust her diet (Which  has carbs in it for every meal). Will let Endo know again Sept 2023: Let Dr. Kelton Pillar know about Hypoglycemia ASAP. Asked if patient could be evaluated before f/u in December March 2024: Sugars look great per [name] and A1c   Insomnia (Goal: 7-8 hours of sleep/night) -Uncontrolled -Patient currently getting  July 2023: 5 hours of sleep/night Sept 2023: bed by 2130, up at 0230 for bathroom, bed until 0600-1000 -Patient currently waking up  July 2023: 2-3 times/night Sept 2023: once a night -Patient's concern is Staying asleep -Current treatment: Trazodone 50mg  Appropriate, Effective, Safe, Accessible -Medications previously tried:  Melatonin -Counseled on sleep hygiene techniques (consistent sleep/wake up schedule, no electronic screens 1-2 hours before bed, etc) July 2023: Will ask PCP about sleep therapy. Used to be on Trazodone (Was stopped around when her Duloxetine was stopped. Duloxetine was stopped due to "blackouts" but she states she never had this issue with Trazodone) and though it is generally for Latency, patient stated this helped. Will ask PCP to assess Sept 2023: Sleeping significantly better per patient. Doesn't have to use med every night either   Osteoporosis / Osteopenia (Goal Prevent fractures) -Uncontrolled -has already broken/fractured many bones. States she fractured her foot while walking from carpet to tile floor -Last DEXA Scan: 2022              T-Score femoral neck:  07/16/21: -1.8             T-Score forearm radius:  07/16/21: -2.4             -Patient is a candidate for pharmacologic treatment due to history of breaks/fractures -Current treatment  Prolia [facility] March 2023) Appropriate, Effective, Safe, Accessible March 2023, December 2023 -Medications previously tried: Calcium/Vit D  -Recommend [phone] units of vitamin D daily. Recommend 1200 mg of calcium daily from dietary and supplemental sources. Sept 2023: Patient would like to get off of Prolia.  She read it causes muscle aches and is convinced this is why she's on it. Will let PCP know -Also, patient would like prescription for a scooter to help her get around. She's afraid to walk because walking has caused her to break her feet in the past. Will ask PCP March 2024: Main purpose of today's visit was to discuss possibility of Denosomab. Patient was interested in starting that but had questions. With all patient's current CARDIO issues right now, this medication would most likely be contraindicated. Counseled patient on risks and she agreed to maintain status  quo for now  CP F/U Oct 2024  [place] Constable, [place].D. - [phone]

## 2022-12-02 ENCOUNTER — Telehealth: Payer: Self-pay

## 2022-12-02 NOTE — Telephone Encounter (Unsigned)
Pt's INR is 1.1. She remains off warfarin and Lovenox, due to large hematoma on abdomen. Dr Bobby Rumpf ordered CT earlier in week to evaluate hematoma. Pt to see Dr Bobby Rumpf for f/u on 12/05/2022. Pt has requested 2nd opinion and would like to see Dr Gareth Morgan.

## 2022-12-04 NOTE — Progress Notes (Unsigned)
[facility]  [address] [place],  [place]  [address] [phone]  Clinic Day:  12/05/2022  Referring physician: [name], MD   HISTORY OF PRESENT ILLNESS:  The patient is a 77 y.o. 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  She comes in today to reassess her clinical picture.  CT scans done last week showed hematomas over her lower abdomen at the sites where she was giving herself Lovenox shots.  This patient was scheduled for back surgery in March 2024.  However, her insurance ultimately elected not to cover this surgery.  In preparation for that back surgery, the patient was switched from Coumadin to Lovenox.  Her last Lovenox shot was given on March 18th.  Despite the severe bruising and secondary hematomas over her lower abdomen, the patient claims they have not enlarged.  Her CT scans showed no intraabdominal bleeding or evidence of acute bleeding taking place.  She essentially come in today to determine if re-initiation of Coumadin appears to be safe at this time.    PHYSICAL EXAM:  Blood pressure (!) 195/77, pulse 85, temperature 97.9 F (36.6 C), resp. rate 14, height 5\' 1"  (1.549 m), weight 154 lb 11.2 oz (70.2 kg), SpO2 93 %. Wt Readings from Last 3 Encounters:  12/05/22 154 lb 11.2 oz (70.2 kg)  11/30/22 150 lb 12.8 oz (68.4 kg)  11/29/22 150 lb (68 kg)   Body mass index is 29.23 kg/m. Performance status (ECOG): 1 - Symptomatic but completely ambulatory Physical Exam Constitutional:      Appearance: Normal appearance. She is not ill-appearing.     Comments: She is ambulating with a cane.  She is sore from her abdominal discomfort.  HENT:     Mouth/Throat:     Mouth: Mucous membranes are moist.     Pharynx: Oropharynx is clear. No oropharyngeal exudate or posterior oropharyngeal erythema.  Cardiovascular:     Rate and Rhythm: Normal rate and regular rhythm.     Heart  sounds: No murmur heard.    No friction rub. No gallop.  Pulmonary:     Effort: Pulmonary effort is normal. No respiratory distress.     Breath sounds: Normal breath sounds. No wheezing, rhonchi or rales.  Abdominal:     General: Bowel sounds are normal. There is no distension. There are no signs of injury.     Palpations: Abdomen is soft. There is no mass.     Tenderness: There is abdominal tenderness in the right lower quadrant and left lower quadrant.     Comments: Multiple areas of violaceous skin discoloration and underlying hematomas, which have not changed in size.  Musculoskeletal:        General: No swelling.     Right lower leg: No edema.     Left lower leg: No edema.  Lymphadenopathy:     Cervical: No cervical adenopathy.     Upper Body:     Right upper body: No supraclavicular or axillary adenopathy.     Left upper body: No supraclavicular or axillary adenopathy.     Lower Body: No right inguinal adenopathy. No left inguinal adenopathy.  Skin:    General: Skin is warm.     Coloration: Skin is not jaundiced.     Findings: No lesion or rash.  Neurological:     General: No focal deficit present.     Mental Status: She is alert and  oriented to person, place, and time. Mental status is at baseline.  Psychiatric:        Mood and Affect: Mood normal.        Behavior: Behavior normal.        Thought Content: Thought content normal.    LABS:      Latest Ref Rng & Units 11/25/2022   12:00 AM 11/16/2022   10:49 AM 09/07/2022   10:00 AM  CBC  WBC  8.8     9.6  9.2   Hemoglobin 12.0 - 16.0 12.7     13.9  12.7   Hematocrit 36 - 46 38     41.4  39.2   Platelets 150 - 400 K/uL 289     285  398      This result is from an external source.      Latest Ref Rng & Units 11/16/2022   10:49 AM 09/07/2022   10:00 AM 08/04/2022    6:08 PM  CMP  Glucose 70 - 99 mg/dL 138  99  168   BUN 8 - 23 mg/dL 23  31  34   Creatinine 0.44 - 1.00 mg/dL 1.20  1.13  1.44   Sodium 135 - 145  mmol/L 136  135  134   Potassium 3.5 - 5.1 mmol/L 4.0  4.1  4.0   Chloride 98 - 111 mmol/L 97  100  96   CO2 22 - 32 mmol/L 29  20  24    Calcium 8.9 - 10.3 mg/dL 9.1  8.2  9.7   Total Protein 6.0 - 8.5 g/dL  6.5  7.4   Total Bilirubin 0.0 - 1.2 mg/dL  0.4  0.5   Alkaline Phos 44 - 121 IU/L  139  84   AST 0 - 40 IU/L  23  29   ALT 0 - 32 IU/L  24  25    Lab Results  Component Value Date   TIBC 315 02/03/2022   FERRITIN 92 02/03/2022   IRONPCTSAT 24 02/03/2022    ASSESSMENT & PLAN:  Assessment/Plan:  A 77 y.o. female with protein S deficiency, as well as a history of mild anemia.  When evaluating her clinical picture today, it does not appear that her hematomas have gotten worse.  They likely developed from her Lovenox injections, which she has not received in 2 weeks.  I do feel that it is wise to get her back on Coumadin now, particularly as she has never had bruising/hematoma issues with this agent before.  She already knows the goal INR is between 2 and 3.  Of note, the patient brings to my attention that she is scheduled to see a hematologist at [facility] tomorrow.  I have no problem with her willing to hold her Coumadin another day until she speaks with that hematologist.  Otherwise, I will see her back in 6 months for repeat clinical assessment.  The patient knows to contact the office before next visit she has additional questions or concerns.    Keison Glendinning Macarthur Critchley, MD

## 2022-12-05 ENCOUNTER — Other Ambulatory Visit: Payer: Self-pay | Admitting: Oncology

## 2022-12-05 ENCOUNTER — Inpatient Hospital Stay: Payer: Medicare PPO | Attending: Oncology | Admitting: Oncology

## 2022-12-05 VITALS — BP 195/77 | HR 85 | Temp 97.9°F | Resp 14 | Ht 61.0 in | Wt 154.7 lb

## 2022-12-05 DIAGNOSIS — D6859 Other primary thrombophilia: Secondary | ICD-10-CM | POA: Diagnosis not present

## 2022-12-05 DIAGNOSIS — D631 Anemia in chronic kidney disease: Secondary | ICD-10-CM

## 2022-12-06 DIAGNOSIS — D6859 Other primary thrombophilia: Secondary | ICD-10-CM | POA: Diagnosis not present

## 2022-12-06 DIAGNOSIS — Z7901 Long term (current) use of anticoagulants: Secondary | ICD-10-CM | POA: Diagnosis not present

## 2022-12-07 ENCOUNTER — Telehealth: Payer: Self-pay

## 2022-12-07 NOTE — Telephone Encounter (Signed)
Dr Joan Flores recommended pt restart the Coumadin 5mg  po qd and Lovenox 40mg  SQ(for 5 days), effective 12/06/2022.  Pt has taken 2 doses of coumadin 5mg , and 2 doses of Lovenox as of right now (1430). Dr Joan Flores recommended recheck of INR in 5-6 days, w/goal INR 2-3.

## 2022-12-09 ENCOUNTER — Other Ambulatory Visit: Payer: Self-pay | Admitting: Neurological Surgery

## 2022-12-11 ENCOUNTER — Other Ambulatory Visit: Payer: Self-pay

## 2022-12-11 DIAGNOSIS — E1140 Type 2 diabetes mellitus with diabetic neuropathy, unspecified: Secondary | ICD-10-CM

## 2022-12-11 DIAGNOSIS — N1831 Chronic kidney disease, stage 3a: Secondary | ICD-10-CM

## 2022-12-11 DIAGNOSIS — E782 Mixed hyperlipidemia: Secondary | ICD-10-CM

## 2022-12-12 ENCOUNTER — Other Ambulatory Visit: Payer: Medicare PPO

## 2022-12-12 ENCOUNTER — Telehealth: Payer: Self-pay

## 2022-12-12 DIAGNOSIS — R7989 Other specified abnormal findings of blood chemistry: Secondary | ICD-10-CM | POA: Diagnosis not present

## 2022-12-12 DIAGNOSIS — N1831 Chronic kidney disease, stage 3a: Secondary | ICD-10-CM | POA: Diagnosis not present

## 2022-12-12 DIAGNOSIS — Z794 Long term (current) use of insulin: Secondary | ICD-10-CM

## 2022-12-12 DIAGNOSIS — E1140 Type 2 diabetes mellitus with diabetic neuropathy, unspecified: Secondary | ICD-10-CM | POA: Diagnosis not present

## 2022-12-12 DIAGNOSIS — Z1159 Encounter for screening for other viral diseases: Secondary | ICD-10-CM | POA: Diagnosis not present

## 2022-12-12 DIAGNOSIS — I129 Hypertensive chronic kidney disease with stage 1 through stage 4 chronic kidney disease, or unspecified chronic kidney disease: Secondary | ICD-10-CM

## 2022-12-12 DIAGNOSIS — E782 Mixed hyperlipidemia: Secondary | ICD-10-CM | POA: Diagnosis not present

## 2022-12-12 NOTE — Telephone Encounter (Signed)
Pt called to report her INR is 1.4 today. She is still taking coumadin 5mg  po qd, and Lovenox BID. Her surgery has been rescheduled for 12/22/2022.

## 2022-12-13 LAB — CBC WITH DIFFERENTIAL/PLATELET
Basophils Absolute: 0.1 10*3/uL (ref 0.0–0.2)
Basos: 1 %
EOS (ABSOLUTE): 0.5 10*3/uL — ABNORMAL HIGH (ref 0.0–0.4)
Eos: 6 %
Hematocrit: 38.9 % (ref 34.0–46.6)
Hemoglobin: 12.8 g/dL (ref 11.1–15.9)
Immature Grans (Abs): 0 10*3/uL (ref 0.0–0.1)
Immature Granulocytes: 0 %
Lymphocytes Absolute: 1.5 10*3/uL (ref 0.7–3.1)
Lymphs: 18 %
MCH: 30.4 pg (ref 26.6–33.0)
MCHC: 32.9 g/dL (ref 31.5–35.7)
MCV: 92 fL (ref 79–97)
Monocytes Absolute: 0.9 10*3/uL (ref 0.1–0.9)
Monocytes: 10 %
Neutrophils Absolute: 5.3 10*3/uL (ref 1.4–7.0)
Neutrophils: 65 %
Platelets: 302 10*3/uL (ref 150–450)
RBC: 4.21 x10E6/uL (ref 3.77–5.28)
RDW: 13 % (ref 11.7–15.4)
WBC: 8.2 10*3/uL (ref 3.4–10.8)

## 2022-12-13 LAB — TSH: TSH: 6.5 u[IU]/mL — ABNORMAL HIGH (ref 0.450–4.500)

## 2022-12-13 LAB — COMPREHENSIVE METABOLIC PANEL
ALT: 42 IU/L — ABNORMAL HIGH (ref 0–32)
AST: 38 IU/L (ref 0–40)
Albumin/Globulin Ratio: 1.7 (ref 1.2–2.2)
Albumin: 4 g/dL (ref 3.8–4.8)
Alkaline Phosphatase: 88 IU/L (ref 44–121)
BUN/Creatinine Ratio: 22 (ref 12–28)
BUN: 27 mg/dL (ref 8–27)
Bilirubin Total: 0.3 mg/dL (ref 0.0–1.2)
CO2: 25 mmol/L (ref 20–29)
Calcium: 9 mg/dL (ref 8.7–10.3)
Chloride: 97 mmol/L (ref 96–106)
Creatinine, Ser: 1.22 mg/dL — ABNORMAL HIGH (ref 0.57–1.00)
Globulin, Total: 2.3 g/dL (ref 1.5–4.5)
Glucose: 131 mg/dL — ABNORMAL HIGH (ref 70–99)
Potassium: 4.7 mmol/L (ref 3.5–5.2)
Sodium: 136 mmol/L (ref 134–144)
Total Protein: 6.3 g/dL (ref 6.0–8.5)
eGFR: 45 mL/min/{1.73_m2} — ABNORMAL LOW (ref >59)

## 2022-12-13 LAB — CARDIOVASCULAR RISK ASSESSMENT

## 2022-12-13 LAB — LIPID PANEL
Chol/HDL Ratio: 1.9 ratio (ref 0.0–4.4)
Cholesterol, Total: 131 mg/dL (ref 100–199)
HDL: 69 mg/dL (ref >39)
LDL Chol Calc (NIH): 50 mg/dL (ref 0–99)
Triglycerides: 54 mg/dL (ref 0–149)
VLDL Cholesterol Cal: 12 mg/dL (ref 5–40)

## 2022-12-13 LAB — HEMOGLOBIN A1C
Est. average glucose Bld gHb Est-mCnc: 148 mg/dL
Hgb A1c MFr Bld: 6.8 % — ABNORMAL HIGH (ref 4.8–5.6)

## 2022-12-13 NOTE — Assessment & Plan Note (Addendum)
Well controlled.  No changes to medicines. Continue  Lisinopril 20 mg 2 daily, Amlodipine 5 mg daily, Aspirin 81 mg daily, Isosorbide 30 mg daily. Continue to work on eating a healthy diet and exercise.  Labs reviewed today

## 2022-12-13 NOTE — Assessment & Plan Note (Addendum)
Well controlled.  Continue protonix 40 mg daily.

## 2022-12-13 NOTE — Assessment & Plan Note (Signed)
Rate controlled. On Coumadin. 

## 2022-12-13 NOTE — Progress Notes (Signed)
Subjective:  Patient ID: Pamela [name], female    DOB: [birth year]  Age: 77 y.o. MRN: [id number]  Chief Complaint  Patient presents with   Diabetes   Hypertension    HPI   Diabetes:  Complications: Hypertension Glucose checking: Several times a day (CGM) (113-250). Sees Endocrinology.  Hypoglycemia: 2-3 weekly  Most recent A1C: 6.8% Current medications: Farxiga 10 mg daily, Toujeo 6 units daily. Novolog sliding scale.  4 U before each meal +  171-220 +1 U 221-270 +2 U 271-320 +3 U 321-370 + 4 U 371- 420 + 5 U 421- 470 + 6 U  Last Eye Exam: 10/21/2021 Foot checks: several days.  Hyperlipidemia: Current medications: Atorvastatin 80 mg daily.  Hypertension: Complications: Diabetes, Hyperlipidemia. Current medications: Lisinopril 20 mg 2 daily, Amlodipine 5 mg daily, Aspirin 81 mg daily, Isosorbide 30 mg daily.  Atrial fibrillation: on coumadin.  GERD: protonix 40 mg once daily.   Protein S Deficiency: Dr. Melvyn Neth manages her coumadin. Last PT/INR 1.1. On coumadin daily.  .   Urge incontinence: on gemtesa 75 mg once daily. Working well.        12/14/2022    9:06 AM 02/15/2022    1:16 PM 01/24/2022    8:21 AM 11/16/2021   11:37 AM 03/10/2021    8:35 AM  Depression screen PHQ 2/9  Decreased Interest 0 0 0 0 0  Down, Depressed, Hopeless 0 0 0 0 0  PHQ - 2 Score 0 0 0 0 0         02/03/2022   10:13 AM 02/15/2022    1:15 PM 06/06/2022   11:08 AM 08/04/2022    5:30 PM 12/14/2022    9:06 AM  Fall Risk  Falls in the past year?  1   0  Was there an injury with Fall?  1   0  Fall Risk Category Calculator  2   0  Fall Risk Category (Retired)  Moderate     (RETIRED) Patient Fall Risk Level Moderate fall risk Moderate fall risk Moderate fall risk Low fall risk   Patient at Risk for Falls Due to  Impaired balance/gait;History of fall(s)   History of fall(s)  Fall risk Follow up  Falls evaluation completed;Falls prevention discussed   Falls evaluation  completed;Falls prevention discussed      Review of Systems  Constitutional:  Negative for chills, fatigue and fever.  HENT:  Negative for congestion, rhinorrhea and sore throat.   Respiratory:  Positive for shortness of breath. Negative for cough.   Cardiovascular:  Negative for chest pain.  Gastrointestinal:  Negative for abdominal pain, constipation, diarrhea, nausea and vomiting.  Genitourinary:  Negative for dysuria and urgency.  Musculoskeletal:  Positive for back pain (Scheduled for back surgery next week). Negative for myalgias.  Neurological:  Negative for dizziness, weakness, light-headedness and headaches.  Psychiatric/Behavioral:  Negative for dysphoric mood. The patient is not nervous/anxious.     Current Outpatient Medications on File Prior to Visit  Medication Sig Dispense Refill   acetaminophen (TYLENOL) 650 MG CR tablet Take 650-1,300 mg by mouth every 8 (eight) hours as needed for pain.     albuterol (VENTOLIN HFA) 108 (90 Base) MCG/ACT inhaler INHALE 1 TO 2 PUFFS INTO THE LUNGS EVERY 6 HOURS AS NEEDED FOR WHEEZING OR SHORTNESS OF BREATH 6.7 g 3   amLODipine (NORVASC) 5 MG tablet TAKE 1 TABLET(5 MG) BY MOUTH DAILY 90 tablet 3   aspirin EC (ASPIRIN LOW DOSE) 81 MG tablet  TAKE 1 TABLET(81 MG) BY MOUTH DAILY 90 tablet 3   atorvastatin (LIPITOR) 80 MG tablet TAKE 1 TABLET(80 MG) BY MOUTH DAILY 90 tablet 3   Calcium Citrate-Vitamin D (CALCIUM CITRATE + PO) Take 1 tablet by mouth daily.     carboxymethylcellulose (REFRESH PLUS) 0.5 % SOLN Place 1 drop into both eyes 3 (three) times daily as needed (dry eyes).     Cholecalciferol (VITAMIN D3) 50 MCG (2000 UT) TABS Take 4,000 Units by mouth daily with lunch.     CINNAMON PO Take 1,000 mg by mouth daily.     clindamycin (CLEOCIN) 300 MG capsule Take 300 mg by mouth See admin instructions. 1 hour before dental procedures     Continuous Blood Gluc Sensor (FREESTYLE LIBRE 2 SENSOR) MISC 2 each by Does not apply route every 14  (fourteen) days. E11.40 6 each 3   cyanocobalamin (VITAMIN B12) 1000 MCG tablet Take 1,000 mcg by mouth daily.     dapagliflozin propanediol (FARXIGA) 10 MG TABS tablet Take 1 tablet (10 mg total) by mouth daily before breakfast. 90 tablet 3   denosumab (PROLIA) 60 MG/ML SOSY injection Inject 60 mg into the skin every 6 (six) months.     estradiol (ESTRACE) 0.1 MG/GM vaginal cream Place 1 Applicatorful vaginally 2 (two) times a week. As needed     ferrous sulfate 325 (65 FE) MG tablet Take 325 mg by mouth daily with lunch.     furosemide (LASIX) 40 MG tablet Take 1 tablet (40 mg total) by mouth daily. 90 tablet 3   GEMTESA 75 MG TABS Take 75 mg by mouth at bedtime.     Insulin Aspart FlexPen (NOVOLOG) 100 UNIT/ML Max daily 30 units (Patient taking differently: Inject 4-5 Units into the skin 3 (three) times daily with meals. Max daily 30 units) 15 mL 6   insulin glargine, 1 Unit Dial, (TOUJEO) 300 UNIT/ML Solostar Pen Inject 8 Units into the skin at bedtime. (Patient taking differently: Inject 6 Units into the skin at bedtime.) 15 mL 3   Insulin Pen Needle (BD PEN NEEDLE NANO 2ND GEN) 32G X 4 MM MISC USE DAILY AS DIRECTED 200 each 3   ipratropium-albuterol (DUONEB) 0.5-2.5 (3) MG/3ML SOLN Take 3 mLs by nebulization 4 (four) times daily as needed (wheezing/shortness of breath).  0   isosorbide mononitrate (IMDUR) 30 MG 24 hr tablet 1 tablet by mouth daily, 2nd attempt, please reach arrange appt for further refills 90 tablet 3   lisinopril (ZESTRIL) 20 MG tablet TAKE 2 TABLETS(40 MG) BY MOUTH TWICE DAILY 180 tablet 3   magnesium oxide (MAG-OX) 400 (240 Mg) MG tablet TAKE 2 TABLETS(800 MG) BY MOUTH TWICE DAILY (Patient taking differently: Take 800 mg by mouth 2 (two) times daily.) 120 tablet 2   methocarbamol (ROBAXIN) 750 MG tablet Take 750 mg by mouth at bedtime.     Multiple Vitamins-Minerals (PRESERVISION AREDS PO) Take 1 capsule by mouth 2 (two) times daily.     nystatin cream (MYCOSTATIN) Apply 1  Application topically 4 (four) times daily as needed for dry skin (yeast infection).     ondansetron (ZOFRAN-ODT) 8 MG disintegrating tablet Take 8 mg by mouth every 8 (eight) hours as needed for nausea or vomiting.      pantoprazole (PROTONIX) 40 MG tablet TAKE 1 TABLET(40 MG) BY MOUTH EVERY MORNING 90 tablet 3   polyethylene glycol powder (MIRALAX) 17 GM/SCOOP powder Take 17 g by mouth daily as needed for moderate constipation.  pyridoxine (B-6) 100 MG tablet Take 100 mg by mouth daily.     sennosides-docusate sodium (SENOKOT-S) 8.6-50 MG tablet Take 1 tablet by mouth daily.     sodium chloride (OCEAN) 0.65 % SOLN nasal spray Place 1 spray into both nostrils as needed for congestion.     traMADol (ULTRAM) 50 MG tablet Take 50-100 mg by mouth every 6 (six) hours as needed.     traZODone (DESYREL) 50 MG tablet Take 50 mg by mouth at bedtime.     warfarin (COUMADIN) 5 MG tablet Take 6 mg by mouth daily.     No current facility-administered medications on file prior to visit.   Past Medical History:  Diagnosis Date   Anticoagulated on Coumadin    chronic--- managed by hematology/ oncology,   Asthma, mild intermittent    followed by pcp--- per pt last exacerbation winter 2021 w/ acute bronchitis   Bilateral leg cramps    Blood dyscrasia    Chronic constipation    CKD (chronic kidney disease), stage III    Closed bimalleolar fracture of left ankle 02/26/2020   DDD (degenerative disc disease), cervical    w/ spondylosis,  per pt last steroid injection 06/ 2022   DDD (degenerative disc disease), lumbosacral    DVT (deep venous thrombosis) 07/30/2013   Dyspnea    occasionally   Dysrhythmia    GERD (gastroesophageal reflux disease)    History of cardiac murmur as a child    History of DVT of lower extremity    left lower extremity in 1980s, fell when bowling   History of pulmonary embolus (PE) 1993   per pt left lung post op 2 wks cholecystectomy   History of rheumatic fever as a  child    per last echo 12-31-2019 no valvular issues   History of TIA (transient ischemic attack)    2014 and 2018 or 2019,  per pt no residual   HTN (hypertension)    followed by pcp   Hyperlipidemia    IDA (iron deficiency anemia)    Macular degeneration of both eyes    Mild obstructive sleep apnea    per pt dx 2017 tried to uses cpap but intolerant   Mixed incontinence urge and stress    urologist--- dr Saddie Benders   OA (osteoarthritis) 07/06/2018   Osteoporosis    PAF (paroxysmal atrial fibrillation) 10/01/2014   cardiologist--- dr Lavona Mound tobb;   cardiac cath 02-18-2013 normal coronaries arteries, ef 50%, cath done since echo showed ef 30-35%; nucleat stress study 04/ 2020 normal , normal echo 04/ 2021,  event monitor 09-14-2020 rare ST/AT variable block   Pneumonia    PONV (postoperative nausea and vomiting)    Protein S deficiency    followed by hemotology/ oncology-- dr d. Melvyn Neth ([facility]) dx 1980s;  prior DVT left lower leg 1980s and left lung PE 1993; chronic  coumadin since 1980s   PVC's (premature ventricular contractions)    followed by cardiology   S/P cardiac catheterization 02/2013   Normal coronaries; low normal EF at 50%   Solitary pulmonary nodule on lung CT 02/06/2019   First noted 01/13/2014 > no change as of 12/21/2018   Spondylolisthesis, lumbar region 08/09/2018   Stroke    TIA in 2018 or 2019   Transient ischemic attack 07/30/2013   Type 2 diabetes mellitus treated with insulin    endocrilogist--- whitney reardon NP     (03-10-2021  pt continuously checks blood sugar throughout the day w/  Libre, fasting sugar --- 69--200)   Wears glasses    Wears hearing aid in both ears    Past Surgical History:  Procedure Laterality Date   ABDOMINAL HYSTERECTOMY     BLEPHAROPLASTY     CARDIAC CATHETERIZATION  02/18/2013   @ [facility]  by Dr [place];  normal coronaries w/ preserved LVF, ef 50%;   previous cath 2001 normal ef 65%   CARPAL TUNNEL RELEASE Bilateral  1994   CATARACT EXTRACTION W/ INTRAOCULAR LENS IMPLANT Bilateral 2017   CHOLECYSTECTOMY     CHOLECYSTECTOMY, LAPAROSCOPIC  1993   COLONOSCOPY     EAR BIOPSY Left    FINGER SURGERY Left 2018   thumb   FOOT SURGERY     FOOT TENDON SURGERY Right    early 2000s   FRACTURE SURGERY Left 02/13/2020   left femur   HAND SURGERY Right 04/2022   HARDWARE REMOVAL Left 03/12/2021   Procedure: HARDWARE REMOVAL;  Surgeon: Yolonda Kidaogers, Jason Patrick, MD;  [facility] [place];  Service: Orthopedics;  Laterality: Left;  60 MINS   JOINT REPLACEMENT     ORIF ANKLE FRACTURE Left 02/26/2020   Procedure: OPEN REDUCTION INTERNAL FIXATION (ORIF) ANKLE FRACTURE;  Surgeon: Yolonda Kidaogers, Jason Patrick, MD;  [facility];  Service: Orthopedics;  Laterality: Left;  90 mins   TONSILLECTOMY AND ADENOIDECTOMY Bilateral    TOTAL KNEE ARTHROPLASTY Left 03/2005   TOTAL VAGINAL HYSTERECTOMY  1988   per pt still has ovaries    Family History  Problem Relation Age of Onset   Cirrhosis Mother    Antithrombin III deficiency Mother        multiple emboli   Ulcerative colitis Mother    Diabetes Father    Coronary artery disease Father    Heart attack Father    Hypertension Father    Kidney disease Father    Hypertension Sister    Diabetes Sister    Heart attack Sister        age 78    Protein S deficiency Daughter    Heart attack Brother    Hypertension Brother    Lung cancer Brother    Stroke Neg Hx    Colitis Neg Hx    Colon polyps Neg Hx    Esophageal cancer Neg Hx    Liver cancer Neg Hx    Pancreatic cancer Neg Hx    Rectal cancer Neg Hx    Stomach cancer Neg Hx    Breast cancer Neg Hx    Social History   Socioeconomic History   Marital status: Married    Spouse name: Jomarie LongsJoseph   Number of children: Not on file   Years of education: Not on file   Highest education level: Some college, no degree  Occupational History   Not on file  Tobacco Use   Smoking status: Never   Smokeless  tobacco: Never  Vaping Use   Vaping Use: Never used  Substance and Sexual Activity   Alcohol use: No   Drug use: Never   Sexual activity: Not on file  Other Topics Concern   Not on file  Social History Narrative   [place]  2 grown daughters.   Retired Print production planneroffice manager     Social Determinants of Health   Financial Resource Strain: Low Risk  (12/10/2022)   Overall Financial Resource Strain (CARDIA)    Difficulty of Paying Living Expenses: Not very hard  Food Insecurity: No Food Insecurity (12/10/2022)   Hunger  Vital Sign    Worried About Programme researcher, broadcasting/film/videounning Out of Food in the Last Year: Never true    Ran Out of Food in the Last Year: Never true  Transportation Needs: No Transportation Needs (12/10/2022)   PRAPARE - Administrator, Civil ServiceTransportation    Lack of Transportation (Medical): No    Lack of Transportation (Non-Medical): No  Physical Activity: Inactive (12/10/2022)   Exercise Vital Sign    Days of Exercise per Week: 0 days    Minutes of Exercise per Session: 0 min  Stress: No Stress Concern Present (12/10/2022)   [employer] - Occupational Stress Questionnaire    Feeling of Stress : Only a little  Social Connections: Unknown (12/10/2022)   Social Connection and Isolation Panel [NHANES]    Frequency of Communication with Friends and Family: More than three times a week    Frequency of Social Gatherings with Friends and Family: Patient declined    Attends Religious Services: Patient declined    Database administratorActive Member of Clubs or Organizations: Yes    Attends Engineer, structuralClub or Organization Meetings: More than 4 times per year    Marital Status: Married    Objective:  BP (!) 104/40   Pulse (!) 56   Temp (!) 97.1 F (36.2 C)   Resp 16   Ht 5' (1.524 m)   Wt 154 lb (69.9 kg)   BMI 30.08 kg/m      12/14/2022    8:40 AM 12/05/2022    2:15 PM 11/30/2022   11:00 AM  BP/Weight  Systolic BP 104 195 157  Diastolic BP 40 77 69  Wt. (Lbs) 154 154.7 150.8  BMI 30.08 kg/m2 29.23  kg/m2 28.49 kg/m2    Physical Exam Vitals reviewed.  Constitutional:      Appearance: Normal appearance. She is normal weight.  Neck:     Vascular: No carotid bruit.  Cardiovascular:     Rate and Rhythm: Normal rate. Rhythm irregular.     Heart sounds: Normal heart sounds.  Pulmonary:     Effort: Pulmonary effort is normal. No respiratory distress.     Breath sounds: Normal breath sounds.  Abdominal:     General: Abdomen is flat. Bowel sounds are normal.     Palpations: Abdomen is soft.     Tenderness: There is no abdominal tenderness.  Neurological:     Mental Status: She is alert and oriented to person, place, and time.  Psychiatric:        Mood and Affect: Mood normal.        Behavior: Behavior normal.     Diabetic Foot Exam - Simple   Simple Foot Form  12/14/2022  3:41 PM  Visual Inspection No deformities, no ulcerations, no other skin breakdown bilaterally: Yes Sensation Testing Intact to touch and monofilament testing bilaterally: Yes Pulse Check Posterior Tibialis and Dorsalis pulse intact bilaterally: Yes Comments Thickened yellow nails        Lab Results  Component Value Date   WBC 8.2 12/12/2022   HGB 12.8 12/12/2022   HCT 38.9 12/12/2022   PLT 302 12/12/2022   GLUCOSE 131 (H) 12/12/2022   CHOL 131 12/12/2022   TRIG 54 12/12/2022   HDL 69 12/12/2022   LDLCALC 50 12/12/2022   ALT 42 (H) 12/12/2022   AST 38 12/12/2022   NA 136 12/12/2022   K 4.7 12/12/2022   CL 97 12/12/2022   CREATININE 1.22 (H) 12/12/2022   BUN 27 12/12/2022   CO2 25 12/12/2022   TSH  6.500 (H) 12/12/2022   INR 1.1 11/30/2022   HGBA1C 6.8 (H) 12/12/2022   MICROALBUR 10 06/15/2021      Assessment & Plan:    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  No changes to medicines. Continue  Lisinopril 20 mg 2 daily, Amlodipine 5 mg daily, Aspirin 81 mg daily, Isosorbide 30 mg daily. Continue to work on eating a  healthy diet and exercise.  Labs reviewed today   Paroxysmal atrial fibrillation Assessment & Plan: Rate controlled. On Coumadin.   Gastroesophageal reflux disease without esophagitis Assessment & Plan: Well controlled.  Continue protonix 40 mg daily.     Type 2 diabetes mellitus with diabetic neuropathy, with long-term current use of insulin Assessment & Plan: Control: fair. Sees endocrinology.  Recommend check sugars before meals and before bed. Has CGM. Recommend check feet daily. Recommend annual eye exams. Medicines: defer to endocrine. Continue to work on eating a healthy diet and exercise.  Labs reviewed today   Orders: -     Microalbumin / creatinine urine ratio  Mixed hyperlipidemia Assessment & Plan: Well controlled.  No changes to medicines. Continue atorvastatin 80 mg before bed.  Continue to work on eating a healthy diet and exercise.  Labs reviewed   Abnormal TSH Assessment & Plan: Start on levothyroxine 25 mcg once daily  Check tsh and free T4 in 6 weeks.   Orders: -     T4, free  Encounter for hepatitis C screening test for low risk patient Assessment & Plan: Add on Hep C Ab.   Orders: -     HCV Ab w Reflex to Quant PCR  Subclinical hypothyroidism Assessment & Plan: Start on levothyroxine 25 mcg once daily  Check tsh and free T4 in 6 weeks.    Acquired thrombophilia Assessment & Plan: Secondary to coumadin for protein s deficiency and atrial fibrillation.    Stage 3b chronic kidney disease Assessment & Plan: Stable.    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  No changes to medicines. Continue  Lisinopril 20 mg 2 daily, Amlodipine 5 mg daily, Aspirin 81 mg daily, Isosorbide 30 mg daily. Continue to work on eating a healthy diet and exercise.  Labs reviewed today   Protein S deficiency [facility]) Assessment & Plan: Followed by hematology.  Hematologist manages  Coumadin.    No orders of the defined types were placed in this encounter.   Orders Placed This Encounter  Procedures   Microalbumin/Creatinine Ratio, Urine   T4, free   HCV Ab w Reflex to Quant PCR     Follow-up: Return in about 4 months (around 04/15/2023) for chronic follow up, lab visit.  I,Marla I Leal-Borjas,acting as a scribe for Blane Ohara, MD.,have documented all relevant documentation on the behalf of Blane Ohara, MD,as directed by  Blane Ohara, MD while in the presence of Blane Ohara, MD.   An After Visit Summary was printed and given to the patient.  Blane Ohara, MD [facility] [phone]

## 2022-12-13 NOTE — Assessment & Plan Note (Addendum)
Well controlled.  No changes to medicines. Continue atorvastatin 80 mg before bed.  Continue to work on eating a healthy diet and exercise.  Labs reviewed

## 2022-12-13 NOTE — Assessment & Plan Note (Addendum)
Control: fair. Sees endocrinology.  Recommend check sugars before meals and before bed. Has CGM. Recommend check feet daily. Recommend annual eye exams. Medicines: defer to endocrine. Continue to work on eating a healthy diet and exercise.  Labs reviewed today

## 2022-12-14 ENCOUNTER — Telehealth: Payer: Self-pay

## 2022-12-14 ENCOUNTER — Ambulatory Visit (INDEPENDENT_AMBULATORY_CARE_PROVIDER_SITE_OTHER): Payer: Medicare PPO | Admitting: Family Medicine

## 2022-12-14 ENCOUNTER — Encounter: Payer: Self-pay | Admitting: Family Medicine

## 2022-12-14 VITALS — BP 104/40 | HR 56 | Temp 97.1°F | Resp 16 | Ht 60.0 in | Wt 154.0 lb

## 2022-12-14 DIAGNOSIS — E782 Mixed hyperlipidemia: Secondary | ICD-10-CM

## 2022-12-14 DIAGNOSIS — Z1159 Encounter for screening for other viral diseases: Secondary | ICD-10-CM | POA: Insufficient documentation

## 2022-12-14 DIAGNOSIS — E038 Other specified hypothyroidism: Secondary | ICD-10-CM | POA: Insufficient documentation

## 2022-12-14 DIAGNOSIS — I5030 Unspecified diastolic (congestive) heart failure: Secondary | ICD-10-CM

## 2022-12-14 DIAGNOSIS — E1140 Type 2 diabetes mellitus with diabetic neuropathy, unspecified: Secondary | ICD-10-CM

## 2022-12-14 DIAGNOSIS — K219 Gastro-esophageal reflux disease without esophagitis: Secondary | ICD-10-CM

## 2022-12-14 DIAGNOSIS — I130 Hypertensive heart and chronic kidney disease with heart failure and stage 1 through stage 4 chronic kidney disease, or unspecified chronic kidney disease: Secondary | ICD-10-CM | POA: Diagnosis not present

## 2022-12-14 DIAGNOSIS — D6869 Other thrombophilia: Secondary | ICD-10-CM

## 2022-12-14 DIAGNOSIS — E039 Hypothyroidism, unspecified: Secondary | ICD-10-CM | POA: Insufficient documentation

## 2022-12-14 DIAGNOSIS — N1832 Chronic kidney disease, stage 3b: Secondary | ICD-10-CM

## 2022-12-14 DIAGNOSIS — I480 Paroxysmal atrial fibrillation: Secondary | ICD-10-CM

## 2022-12-14 DIAGNOSIS — R7989 Other specified abnormal findings of blood chemistry: Secondary | ICD-10-CM | POA: Diagnosis not present

## 2022-12-14 DIAGNOSIS — D6859 Other primary thrombophilia: Secondary | ICD-10-CM

## 2022-12-14 DIAGNOSIS — I110 Hypertensive heart disease with heart failure: Secondary | ICD-10-CM

## 2022-12-14 DIAGNOSIS — Z794 Long term (current) use of insulin: Secondary | ICD-10-CM

## 2022-12-14 NOTE — Telephone Encounter (Signed)
FUTURE SX- Stop warfarin 5 days before. No pre procedure Lovenox bridge. Start Lovenox 40mg  qd after sx, progress to BID when surgeon ok's. Restart coumadin a few days after sx & bridge w/Lovenox for 5 days per Dr Isaiah Serge, Hem/Onc @ [facility].  Patients sx 12/22/22. Needs Lovenox refills

## 2022-12-15 ENCOUNTER — Telehealth: Payer: Self-pay | Admitting: Hematology and Oncology

## 2022-12-15 ENCOUNTER — Other Ambulatory Visit: Payer: Self-pay | Admitting: Hematology and Oncology

## 2022-12-15 LAB — SPECIMEN STATUS REPORT

## 2022-12-15 LAB — T4, FREE: Free T4: 1.51 ng/dL (ref 0.82–1.77)

## 2022-12-15 LAB — HCV AB W REFLEX TO QUANT PCR: HCV Ab: NONREACTIVE

## 2022-12-15 LAB — HCV INTERPRETATION

## 2022-12-15 MED ORDER — ENOXAPARIN SODIUM 40 MG/0.4ML IJ SOSY: 40.0000 mg | PREFILLED_SYRINGE | Freq: Two times a day (BID) | INTRAMUSCULAR | 0 refills | Status: DC

## 2022-12-15 NOTE — Telephone Encounter (Signed)
Spoke with the patient, she states Dr. Isaiah Serge knows her surgery has been rescheduled for next week.  She continues Lovenox 40 mg twice daily as her INR was not therapeutic earlier this week.  She states Dr. Isaiah Serge advised her to stop warfarin and Lovenox on Saturday and stop aspirin on Monday prior to surgery.  She states she is to start Lovenox daily after surgery and then increase to twice daily when Dr. Danielle Dess is comfortable and resume warfarin at that time.  She has a Lovenox injection for tonight.  She has not repeated her INR.  She requests a refill of Lovenox to get her through this surgical procedure, so I sent that in.

## 2022-12-16 ENCOUNTER — Telehealth: Payer: Self-pay

## 2022-12-16 ENCOUNTER — Other Ambulatory Visit: Payer: Self-pay

## 2022-12-16 DIAGNOSIS — E038 Other specified hypothyroidism: Secondary | ICD-10-CM

## 2022-12-16 LAB — PROTIME-INR: INR: 2.9 — AB (ref 0.80–1.20)

## 2022-12-16 MED ORDER — LEVOTHYROXINE SODIUM 25 MCG PO TABS
25.0000 ug | ORAL_TABLET | Freq: Every day | ORAL | 1 refills | Status: DC
Start: 2022-12-16 — End: 2023-04-17

## 2022-12-16 NOTE — Telephone Encounter (Signed)
-----   Message from Adah Perl, PA-C sent at 12/16/2022  2:32 PM EDT ----- Regarding: RE: INR She doesn't have to take her Lovenox tonight though. She is holding both beginning tomorrow per Dr. Isaiah Serge. Thanks ----- Message ----- From: Dyane Dustman, RN Sent: 12/16/2022  12:45 PM EDT To: Hipolito Bayley, RN; Adah Perl, PA-C Subject: INR                                            Today INR 2.9.  She said she has to hold it starting tomorrow for a procedure on Thursday.

## 2022-12-16 NOTE — Telephone Encounter (Signed)
Patient is aware:  No Lovenox tonight and to hold both starting tomorrow.

## 2022-12-17 DIAGNOSIS — S32010A Wedge compression fracture of first lumbar vertebra, initial encounter for closed fracture: Secondary | ICD-10-CM

## 2022-12-17 DIAGNOSIS — N1832 Chronic kidney disease, stage 3b: Secondary | ICD-10-CM | POA: Insufficient documentation

## 2022-12-17 HISTORY — DX: Chronic kidney disease, stage 3b: N18.32

## 2022-12-17 HISTORY — DX: Wedge compression fracture of first lumbar vertebra, initial encounter for closed fracture: S32.010A

## 2022-12-17 NOTE — Assessment & Plan Note (Signed)
Well controlled.  No changes to medicines. Continue  Lisinopril 20 mg 2 daily, Amlodipine 5 mg daily, Aspirin 81 mg daily, Isosorbide 30 mg daily. Continue to work on eating a healthy diet and exercise.  Labs reviewed today

## 2022-12-17 NOTE — Assessment & Plan Note (Signed)
Start on levothyroxine 25 mcg once daily  Check tsh and free T4 in 6 weeks.

## 2022-12-17 NOTE — Assessment & Plan Note (Signed)
Followed by hematology.  Hematologist manages Coumadin. 

## 2022-12-17 NOTE — Assessment & Plan Note (Signed)
Stable

## 2022-12-17 NOTE — Assessment & Plan Note (Signed)
Add on Hep C Ab.

## 2022-12-17 NOTE — Assessment & Plan Note (Signed)
Secondary to coumadin for protein s deficiency and atrial fibrillation.

## 2022-12-19 ENCOUNTER — Other Ambulatory Visit (HOSPITAL_COMMUNITY): Payer: Medicare PPO

## 2022-12-19 DIAGNOSIS — E1122 Type 2 diabetes mellitus with diabetic chronic kidney disease: Secondary | ICD-10-CM | POA: Diagnosis not present

## 2022-12-19 DIAGNOSIS — D6859 Other primary thrombophilia: Secondary | ICD-10-CM | POA: Diagnosis not present

## 2022-12-19 DIAGNOSIS — I129 Hypertensive chronic kidney disease with stage 1 through stage 4 chronic kidney disease, or unspecified chronic kidney disease: Secondary | ICD-10-CM | POA: Diagnosis not present

## 2022-12-19 DIAGNOSIS — D509 Iron deficiency anemia, unspecified: Secondary | ICD-10-CM | POA: Diagnosis not present

## 2022-12-19 DIAGNOSIS — N1831 Chronic kidney disease, stage 3a: Secondary | ICD-10-CM | POA: Diagnosis not present

## 2022-12-19 DIAGNOSIS — N390 Urinary tract infection, site not specified: Secondary | ICD-10-CM | POA: Diagnosis not present

## 2022-12-21 ENCOUNTER — Encounter (HOSPITAL_COMMUNITY): Payer: Self-pay | Admitting: Neurological Surgery

## 2022-12-21 LAB — LAB REPORT - SCANNED
Creatinine, POC: 85.4 mg/dL
EGFR: 48

## 2022-12-21 NOTE — Progress Notes (Signed)
PCP - Dr Mickey Farber Cardiologist - Thomasene Ripple, DO Hem - Dr Rennis Harding  Chest x-ray - 10/13/22 EKG - 08/05/22 Stress Test - 12/22/18 ECHO - 12/31/19 Cardiac Cath - 02/18/13  ICD Pacemaker/Loop - n/a  Sleep Study -  Yes CPAP - does not use CPAP  Diabetes Type 2 Freestyle Libre 2 - sensor left arm  THE NIGHT BEFORE SURGERY, take 3 units of Toujeo      THE MORNING OF SURGERY, do not take Novolog Insulin unless your CBG is >220 mg/dL.  If CBG > 220 mg/dL, then you may take  of your sliding scale (correction) dose of insulin.  If your blood sugar is less than 70 mg/dL, you will need to treat for low blood sugar: Treat a low blood sugar (less than 70 mg/dL) with  cup of clear juice (cranberry or apple), 4 glucose tablets, OR glucose gel. Recheck blood sugar in 15 minutes after treatment (to make sure it is greater than 70 mg/dL). If your blood sugar is not greater than 70 mg/dL on recheck, call [phone] for further instructions.  Blood Thinner Instructions: Hold Farxiga 72 hours prior to procedure.  Last dose was on  12/17/22.  Coumadin - last dose was 12/17/22.  Aspirin Instructions: Last dose was on 12/19/22.   ERAS: Clear liquids til 4:30 AM DOS  Anesthesia review: Yes  STOP now taking any Aspirin (unless otherwise instructed by your surgeon), Aleve, Naproxen, Ibuprofen, Motrin, Advil, Goody's, BC's, all herbal medications, fish oil, and all vitamins.   Coronavirus Screening Do you have any of the following symptoms:  Cough yes/no: No Fever (>100.36F)  yes/no: No Runny nose yes/no: No Sore throat yes/no: No Difficulty breathing/shortness of breath  Yes  Have you traveled in the last 14 days and where? yes/no: No  Patient verbalized understanding of instructions that were given via phone.

## 2022-12-21 NOTE — Anesthesia Preprocedure Evaluation (Signed)
Anesthesia Evaluation  Patient identified by MRN, date of birth, ID band Patient awake    Reviewed: Allergy & Precautions, NPO status , Patient's Chart, lab work & pertinent test results  History of Anesthesia Complications (+) PONV and history of anesthetic complications  Airway Mallampati: III  TM Distance: <3 FB Neck ROM: Full    Dental  (+) Teeth Intact, Dental Advisory Given   Pulmonary asthma , sleep apnea    breath sounds clear to auscultation       Cardiovascular hypertension, +CHF  + dysrhythmias Atrial Fibrillation + Valvular Problems/Murmurs  Rhythm:Regular Rate:Normal  Echo: 1. Left ventricular ejection fraction, by estimation, is 60 to 65%. The  left ventricle has normal function. The left ventricle has no regional  wall motion abnormalities. There is moderate concentric left ventricular  hypertrophy. Left ventricular  diastolic parameters are consistent with Grade I diastolic dysfunction  (impaired relaxation).   2. Right ventricular systolic function is normal. The right ventricular  size is normal. There is normal pulmonary artery systolic pressure.   3. Left atrial size was mildly dilated.   4. The mitral valve is normal in structure. Trivial mitral valve  regurgitation. No evidence of mitral stenosis.   5. The aortic valve is tricuspid. Aortic valve regurgitation is not  visualized. No aortic stenosis is present.   6. The inferior vena cava is normal in size with greater than 50%  respiratory variability, suggesting right atrial pressure of 3 mmHg.      Neuro/Psych CVA    GI/Hepatic Neg liver ROS,GERD  ,,  Endo/Other  diabetesHypothyroidism    Renal/GU Renal disease     Musculoskeletal  (+) Arthritis ,    Abdominal   Peds  Hematology  (+) Blood dyscrasia, anemia   Anesthesia Other Findings   Reproductive/Obstetrics                             Anesthesia  Physical Anesthesia Plan  ASA: 2  Anesthesia Plan: General   Post-op Pain Management: Toradol IV (intra-op)*   Induction: Intravenous  PONV Risk Score and Plan: 4 or greater and Ondansetron and Treatment may vary due to age or medical condition  Airway Management Planned: Oral ETT  Additional Equipment: None  Intra-op Plan:   Post-operative Plan: Extubation in OR  Informed Consent: I have reviewed the patients History and Physical, chart, labs and discussed the procedure including the risks, benefits and alternatives for the proposed anesthesia with the patient or authorized representative who has indicated his/her understanding and acceptance.     Dental advisory given  Plan Discussed with: CRNA  Anesthesia Plan Comments: (See recent PAT note by Rica Mast, NP on 11/16/22. Surgery delayed at that time for insurance reasons. Pt not on Lovenox bridge this time at the direction of hematology due to hematoma development previously with Lovenox injections.   )        Anesthesia Quick Evaluation

## 2022-12-22 ENCOUNTER — Other Ambulatory Visit: Payer: Self-pay

## 2022-12-22 ENCOUNTER — Observation Stay (HOSPITAL_COMMUNITY)
Admission: RE | Admit: 2022-12-22 | Discharge: 2022-12-27 | Disposition: A | Discharge status: Skilled Nursing Facility | Payer: Medicare PPO | Attending: Neurological Surgery | Admitting: Neurological Surgery

## 2022-12-22 ENCOUNTER — Encounter (HOSPITAL_COMMUNITY): Payer: Self-pay | Admitting: Neurological Surgery

## 2022-12-22 ENCOUNTER — Encounter (HOSPITAL_COMMUNITY)
Admission: RE | Disposition: A | Discharge status: Skilled Nursing Facility | Payer: Self-pay | Source: Home / Self Care | Attending: Neurological Surgery

## 2022-12-22 ENCOUNTER — Ambulatory Visit (HOSPITAL_COMMUNITY): Payer: Medicare PPO | Admitting: Physician Assistant

## 2022-12-22 ENCOUNTER — Ambulatory Visit (HOSPITAL_BASED_OUTPATIENT_CLINIC_OR_DEPARTMENT_OTHER): Payer: Medicare PPO | Admitting: Physician Assistant

## 2022-12-22 ENCOUNTER — Ambulatory Visit (HOSPITAL_COMMUNITY): Payer: Medicare PPO

## 2022-12-22 DIAGNOSIS — Z96652 Presence of left artificial knee joint: Secondary | ICD-10-CM | POA: Insufficient documentation

## 2022-12-22 DIAGNOSIS — Z86711 Personal history of pulmonary embolism: Secondary | ICD-10-CM | POA: Insufficient documentation

## 2022-12-22 DIAGNOSIS — J45909 Unspecified asthma, uncomplicated: Secondary | ICD-10-CM | POA: Insufficient documentation

## 2022-12-22 DIAGNOSIS — Z7982 Long term (current) use of aspirin: Secondary | ICD-10-CM | POA: Insufficient documentation

## 2022-12-22 DIAGNOSIS — I480 Paroxysmal atrial fibrillation: Secondary | ICD-10-CM | POA: Diagnosis not present

## 2022-12-22 DIAGNOSIS — I509 Heart failure, unspecified: Secondary | ICD-10-CM | POA: Diagnosis not present

## 2022-12-22 DIAGNOSIS — Z8673 Personal history of transient ischemic attack (TIA), and cerebral infarction without residual deficits: Secondary | ICD-10-CM | POA: Diagnosis not present

## 2022-12-22 DIAGNOSIS — M5416 Radiculopathy, lumbar region: Secondary | ICD-10-CM | POA: Diagnosis not present

## 2022-12-22 DIAGNOSIS — M48062 Spinal stenosis, lumbar region with neurogenic claudication: Secondary | ICD-10-CM

## 2022-12-22 DIAGNOSIS — E039 Hypothyroidism, unspecified: Secondary | ICD-10-CM | POA: Insufficient documentation

## 2022-12-22 DIAGNOSIS — I129 Hypertensive chronic kidney disease with stage 1 through stage 4 chronic kidney disease, or unspecified chronic kidney disease: Secondary | ICD-10-CM | POA: Insufficient documentation

## 2022-12-22 DIAGNOSIS — Z794 Long term (current) use of insulin: Secondary | ICD-10-CM | POA: Insufficient documentation

## 2022-12-22 DIAGNOSIS — I110 Hypertensive heart disease with heart failure: Secondary | ICD-10-CM | POA: Diagnosis not present

## 2022-12-22 DIAGNOSIS — Z7901 Long term (current) use of anticoagulants: Secondary | ICD-10-CM | POA: Diagnosis not present

## 2022-12-22 DIAGNOSIS — Z79899 Other long term (current) drug therapy: Secondary | ICD-10-CM | POA: Diagnosis not present

## 2022-12-22 DIAGNOSIS — Z86718 Personal history of other venous thrombosis and embolism: Secondary | ICD-10-CM | POA: Diagnosis not present

## 2022-12-22 DIAGNOSIS — N183 Chronic kidney disease, stage 3 unspecified: Secondary | ICD-10-CM | POA: Insufficient documentation

## 2022-12-22 DIAGNOSIS — Z981 Arthrodesis status: Secondary | ICD-10-CM | POA: Diagnosis not present

## 2022-12-22 DIAGNOSIS — E1122 Type 2 diabetes mellitus with diabetic chronic kidney disease: Secondary | ICD-10-CM | POA: Insufficient documentation

## 2022-12-22 DIAGNOSIS — M5116 Intervertebral disc disorders with radiculopathy, lumbar region: Secondary | ICD-10-CM | POA: Diagnosis not present

## 2022-12-22 HISTORY — PX: LUMBAR LAMINECTOMY/DECOMPRESSION MICRODISCECTOMY: SHX5026

## 2022-12-22 HISTORY — DX: Hypothyroidism, unspecified: E03.9

## 2022-12-22 HISTORY — DX: Spinal stenosis, lumbar region with neurogenic claudication: M48.062

## 2022-12-22 LAB — SURGICAL PCR SCREEN
MRSA, PCR: NEGATIVE
Staphylococcus aureus: NEGATIVE

## 2022-12-22 LAB — GLUCOSE, CAPILLARY
Glucose-Capillary: 164 mg/dL — ABNORMAL HIGH (ref 70–99)
Glucose-Capillary: 211 mg/dL — ABNORMAL HIGH (ref 70–99)
Glucose-Capillary: 318 mg/dL — ABNORMAL HIGH (ref 70–99)
Glucose-Capillary: 243 mg/dL — ABNORMAL HIGH (ref 70–99)

## 2022-12-22 LAB — PROTIME-INR
INR: 1.1 (ref 0.8–1.2)
Prothrombin Time: 14.2 seconds (ref 11.4–15.2)

## 2022-12-22 LAB — APTT: aPTT: 26 seconds (ref 24–36)

## 2022-12-22 SURGERY — LUMBAR LAMINECTOMY/DECOMPRESSION MICRODISCECTOMY 2 LEVELS
Anesthesia: General | Site: Back

## 2022-12-22 MED ORDER — METHOCARBAMOL 500 MG PO TABS
500.0000 mg | ORAL_TABLET | Freq: Four times a day (QID) | ORAL | Status: DC | PRN
  Administered 2022-12-24: 500 mg via ORAL
  Filled 2022-12-22: qty 1

## 2022-12-22 MED ORDER — SIMETHICONE 80 MG PO CHEW
80.0000 mg | CHEWABLE_TABLET | Freq: Four times a day (QID) | ORAL | Status: DC | PRN
  Administered 2022-12-22 – 2022-12-27 (×6): 80 mg via ORAL
  Filled 2022-12-22 (×6): qty 1

## 2022-12-22 MED ORDER — ONDANSETRON HCL 4 MG/2ML IJ SOLN
4.0000 mg | Freq: Four times a day (QID) | INTRAMUSCULAR | Status: DC | PRN
  Filled 2022-12-22 (×2): qty 2

## 2022-12-22 MED ORDER — 0.9 % SODIUM CHLORIDE (POUR BTL) OPTIME
TOPICAL | Status: DC | PRN
  Administered 2022-12-22: 1000 mL

## 2022-12-22 MED ORDER — PROPOFOL 10 MG/ML IV BOLUS
INTRAVENOUS | Status: DC | PRN
  Administered 2022-12-22: 100 mg via INTRAVENOUS

## 2022-12-22 MED ORDER — BISACODYL 10 MG RE SUPP: 10.0000 mg | Freq: Every day | RECTAL | Status: DC | PRN

## 2022-12-22 MED ORDER — MIDAZOLAM HCL 2 MG/2ML IJ SOLN
INTRAMUSCULAR | Status: DC | PRN
  Administered 2022-12-22: 1 mg via INTRAVENOUS

## 2022-12-22 MED ORDER — PHENOL 1.4 % MT LIQD: 1.0000 | OROMUCOSAL | Status: DC | PRN

## 2022-12-22 MED ORDER — AMISULPRIDE (ANTIEMETIC) 5 MG/2ML IV SOLN: 10.0000 mg | Freq: Once | INTRAVENOUS | Status: DC | PRN

## 2022-12-22 MED ORDER — SUGAMMADEX SODIUM 200 MG/2ML IV SOLN
INTRAVENOUS | Status: DC | PRN
  Administered 2022-12-22: 150 mg via INTRAVENOUS

## 2022-12-22 MED ORDER — LEVOTHYROXINE SODIUM 25 MCG PO TABS: 25.0000 ug | ORAL_TABLET | Freq: Every day | ORAL | Status: DC

## 2022-12-22 MED ORDER — CARBOXYMETHYLCELLULOSE SODIUM 0.5 % OP SOLN: 1.0000 [drp] | Freq: Three times a day (TID) | OPHTHALMIC | Status: DC | PRN

## 2022-12-22 MED ORDER — ISOSORBIDE MONONITRATE ER 30 MG PO TB24
30.0000 mg | ORAL_TABLET | Freq: Every day | ORAL | Status: DC
  Administered 2022-12-23 – 2022-12-27 (×5): 30 mg via ORAL
  Filled 2022-12-22 (×5): qty 1

## 2022-12-22 MED ORDER — VANCOMYCIN HCL IN DEXTROSE 1-5 GM/200ML-% IV SOLN
1000.0000 mg | INTRAVENOUS | Status: AC
  Administered 2022-12-22: 1000 mg via INTRAVENOUS
  Filled 2022-12-22: qty 200

## 2022-12-22 MED ORDER — POLYETHYLENE GLYCOL 3350 17 G PO PACK
17.0000 g | PACK | Freq: Every day | ORAL | Status: DC | PRN
  Administered 2022-12-24 – 2022-12-25 (×2): 17 g via ORAL
  Filled 2022-12-22 (×2): qty 1

## 2022-12-22 MED ORDER — SODIUM CHLORIDE 0.9% FLUSH: 3.0000 mL | INTRAVENOUS | Status: DC | PRN

## 2022-12-22 MED ORDER — ENOXAPARIN SODIUM 40 MG/0.4ML IJ SOSY
40.0000 mg | PREFILLED_SYRINGE | Freq: Two times a day (BID) | INTRAMUSCULAR | Status: DC
  Administered 2022-12-22 – 2022-12-27 (×10): 40 mg via SUBCUTANEOUS
  Filled 2022-12-22 (×10): qty 0.4

## 2022-12-22 MED ORDER — DAPAGLIFLOZIN PROPANEDIOL 10 MG PO TABS
10.0000 mg | ORAL_TABLET | Freq: Every day | ORAL | Status: DC
  Administered 2022-12-23 – 2022-12-27 (×5): 10 mg via ORAL
  Filled 2022-12-22 (×4): qty 1

## 2022-12-22 MED ORDER — ONDANSETRON HCL 4 MG/2ML IJ SOLN
INTRAMUSCULAR | Status: AC
  Filled 2022-12-22: qty 2

## 2022-12-22 MED ORDER — MORPHINE SULFATE (PF) 2 MG/ML IV SOLN: 2.0000 mg | INTRAVENOUS | Status: DC | PRN

## 2022-12-22 MED ORDER — CHLORHEXIDINE GLUCONATE 0.12 % MT SOLN
OROMUCOSAL | Status: AC
  Filled 2022-12-22: qty 15

## 2022-12-22 MED ORDER — BUPIVACAINE HCL (PF) 0.5 % IJ SOLN
INTRAMUSCULAR | Status: AC
  Filled 2022-12-22: qty 30

## 2022-12-22 MED ORDER — ACETAMINOPHEN 160 MG/5ML PO SOLN: 325.0000 mg | Freq: Once | ORAL | Status: DC | PRN

## 2022-12-22 MED ORDER — MIDAZOLAM HCL 2 MG/2ML IJ SOLN
INTRAMUSCULAR | Status: AC
  Filled 2022-12-22: qty 2

## 2022-12-22 MED ORDER — INSULIN ASPART 100 UNIT/ML IJ SOLN: 0.0000 [IU] | INTRAMUSCULAR | Status: DC | PRN

## 2022-12-22 MED ORDER — PHENYLEPHRINE 80 MCG/ML (10ML) SYRINGE FOR IV PUSH (FOR BLOOD PRESSURE SUPPORT): PREFILLED_SYRINGE | INTRAVENOUS | Status: DC | PRN

## 2022-12-22 MED ORDER — OXYCODONE-ACETAMINOPHEN 5-325 MG PO TABS
ORAL_TABLET | ORAL | Status: AC
  Filled 2022-12-22: qty 1

## 2022-12-22 MED ORDER — ONDANSETRON HCL 4 MG PO TABS
4.0000 mg | ORAL_TABLET | Freq: Four times a day (QID) | ORAL | Status: DC | PRN
  Administered 2022-12-24 – 2022-12-25 (×2): 4 mg via ORAL
  Filled 2022-12-22 (×4): qty 1

## 2022-12-22 MED ORDER — SENNA 8.6 MG PO TABS
1.0000 | ORAL_TABLET | Freq: Two times a day (BID) | ORAL | Status: DC
  Administered 2022-12-22 – 2022-12-27 (×11): 8.6 mg via ORAL
  Filled 2022-12-22 (×11): qty 1

## 2022-12-22 MED ORDER — LIDOCAINE HCL (CARDIAC) PF 100 MG/5ML IV SOSY
PREFILLED_SYRINGE | INTRAVENOUS | Status: DC | PRN
  Administered 2022-12-22: 100 mg via INTRAVENOUS

## 2022-12-22 MED ORDER — LEVOTHYROXINE SODIUM 25 MCG PO TABS
25.0000 ug | ORAL_TABLET | Freq: Every day | ORAL | Status: DC
  Administered 2022-12-23 – 2022-12-27 (×5): 25 ug via ORAL
  Filled 2022-12-22 (×5): qty 1

## 2022-12-22 MED ORDER — PANTOPRAZOLE SODIUM 40 MG PO TBEC
40.0000 mg | DELAYED_RELEASE_TABLET | Freq: Every day | ORAL | Status: DC
  Administered 2022-12-23 – 2022-12-27 (×5): 40 mg via ORAL
  Filled 2022-12-22 (×5): qty 1

## 2022-12-22 MED ORDER — LACTATED RINGERS IV SOLN: INTRAVENOUS | Status: DC

## 2022-12-22 MED ORDER — ROCURONIUM BROMIDE 10 MG/ML (PF) SYRINGE
PREFILLED_SYRINGE | INTRAVENOUS | Status: AC
  Filled 2022-12-22: qty 10

## 2022-12-22 MED ORDER — FLEET ENEMA 7-19 GM/118ML RE ENEM: 1.0000 | ENEMA | Freq: Once | RECTAL | Status: DC | PRN

## 2022-12-22 MED ORDER — ROCURONIUM BROMIDE 10 MG/ML (PF) SYRINGE
PREFILLED_SYRINGE | INTRAVENOUS | Status: DC | PRN
  Administered 2022-12-22: 50 mg via INTRAVENOUS

## 2022-12-22 MED ORDER — LIDOCAINE-EPINEPHRINE 1 %-1:100000 IJ SOLN
INTRAMUSCULAR | Status: DC | PRN
  Administered 2022-12-22: 5 mL

## 2022-12-22 MED ORDER — ACETAMINOPHEN 500 MG PO TABS: 500.0000 mg | ORAL_TABLET | Freq: Three times a day (TID) | ORAL | Status: DC | PRN

## 2022-12-22 MED ORDER — EPHEDRINE 5 MG/ML INJ
INTRAVENOUS | Status: AC
  Filled 2022-12-22: qty 5

## 2022-12-22 MED ORDER — FENTANYL CITRATE (PF) 250 MCG/5ML IJ SOLN
INTRAMUSCULAR | Status: DC | PRN
  Administered 2022-12-22: 100 ug via INTRAVENOUS

## 2022-12-22 MED ORDER — AMLODIPINE BESYLATE 5 MG PO TABS
5.0000 mg | ORAL_TABLET | Freq: Every day | ORAL | Status: DC
  Administered 2022-12-23 – 2022-12-27 (×5): 5 mg via ORAL
  Filled 2022-12-22 (×5): qty 1

## 2022-12-22 MED ORDER — ACETAMINOPHEN 10 MG/ML IV SOLN: 1000.0000 mg | Freq: Once | INTRAVENOUS | Status: DC | PRN

## 2022-12-22 MED ORDER — ATORVASTATIN CALCIUM 80 MG PO TABS
80.0000 mg | ORAL_TABLET | Freq: Every day | ORAL | Status: DC
  Administered 2022-12-23 – 2022-12-27 (×5): 80 mg via ORAL
  Filled 2022-12-22 (×5): qty 1

## 2022-12-22 MED ORDER — ACETAMINOPHEN 500 MG PO TABS
500.0000 mg | ORAL_TABLET | Freq: Four times a day (QID) | ORAL | Status: DC | PRN
  Administered 2022-12-22: 500 mg via ORAL
  Administered 2022-12-23 – 2022-12-24 (×2): 1000 mg via ORAL
  Administered 2022-12-25 (×2): 500 mg via ORAL
  Administered 2022-12-26 – 2022-12-27 (×4): 1000 mg via ORAL
  Filled 2022-12-22 (×5): qty 2
  Filled 2022-12-22 (×2): qty 1
  Filled 2022-12-22: qty 2

## 2022-12-22 MED ORDER — THROMBIN 5000 UNITS EX SOLR: OROMUCOSAL | Status: DC | PRN

## 2022-12-22 MED ORDER — OXYCODONE-ACETAMINOPHEN 5-325 MG PO TABS
1.0000 | ORAL_TABLET | Freq: Four times a day (QID) | ORAL | Status: DC | PRN
  Administered 2022-12-22: 1 via ORAL

## 2022-12-22 MED ORDER — CHLORHEXIDINE GLUCONATE 0.12 % MT SOLN
15.0000 mL | Freq: Once | OROMUCOSAL | Status: AC
  Administered 2022-12-22: 15 mL via OROMUCOSAL

## 2022-12-22 MED ORDER — ACETAMINOPHEN 325 MG PO TABS: 650.0000 mg | ORAL_TABLET | ORAL | Status: DC | PRN

## 2022-12-22 MED ORDER — PROMETHAZINE HCL 25 MG/ML IJ SOLN: 6.2500 mg | INTRAMUSCULAR | Status: DC | PRN

## 2022-12-22 MED ORDER — ALUM & MAG HYDROXIDE-SIMETH 200-200-20 MG/5ML PO SUSP: 30.0000 mL | Freq: Four times a day (QID) | ORAL | Status: DC | PRN

## 2022-12-22 MED ORDER — INSULIN GLARGINE-YFGN 100 UNIT/ML ~~LOC~~ SOLN
8.0000 [IU] | Freq: Every day | SUBCUTANEOUS | Status: DC
  Administered 2022-12-22: 6 [IU] via SUBCUTANEOUS
  Administered 2022-12-24: 8 [IU] via SUBCUTANEOUS
  Filled 2022-12-22 (×3): qty 0.08

## 2022-12-22 MED ORDER — EPHEDRINE SULFATE-NACL 50-0.9 MG/10ML-% IV SOSY
PREFILLED_SYRINGE | INTRAVENOUS | Status: DC | PRN
  Administered 2022-12-22 (×3): 5 mg via INTRAVENOUS

## 2022-12-22 MED ORDER — ACETAMINOPHEN 650 MG RE SUPP: 650.0000 mg | RECTAL | Status: DC | PRN

## 2022-12-22 MED ORDER — LIDOCAINE 2% (20 MG/ML) 5 ML SYRINGE
INTRAMUSCULAR | Status: AC
  Filled 2022-12-22: qty 5

## 2022-12-22 MED ORDER — SALINE SPRAY 0.65 % NA SOLN: 1.0000 | NASAL | Status: DC | PRN

## 2022-12-22 MED ORDER — ONDANSETRON 4 MG PO TBDP: 8.0000 mg | ORAL_TABLET | Freq: Three times a day (TID) | ORAL | Status: DC | PRN

## 2022-12-22 MED ORDER — DOCUSATE SODIUM 100 MG PO CAPS
100.0000 mg | ORAL_CAPSULE | Freq: Two times a day (BID) | ORAL | Status: DC
  Administered 2022-12-22 – 2022-12-26 (×9): 100 mg via ORAL
  Filled 2022-12-22 (×10): qty 1

## 2022-12-22 MED ORDER — INSULIN ASPART 100 UNIT/ML IJ SOLN
0.0000 [IU] | Freq: Three times a day (TID) | INTRAMUSCULAR | Status: DC
  Administered 2022-12-22: 7 [IU] via SUBCUTANEOUS

## 2022-12-22 MED ORDER — CHLORHEXIDINE GLUCONATE CLOTH 2 % EX PADS: 6.0000 | MEDICATED_PAD | Freq: Once | CUTANEOUS | Status: DC

## 2022-12-22 MED ORDER — SODIUM CHLORIDE 0.9% FLUSH
3.0000 mL | Freq: Two times a day (BID) | INTRAVENOUS | Status: DC
  Administered 2022-12-23: 3 mL via INTRAVENOUS

## 2022-12-22 MED ORDER — LISINOPRIL 20 MG PO TABS
40.0000 mg | ORAL_TABLET | Freq: Two times a day (BID) | ORAL | Status: DC
  Administered 2022-12-22 – 2022-12-27 (×10): 40 mg via ORAL
  Filled 2022-12-22 (×11): qty 2

## 2022-12-22 MED ORDER — SENNOSIDES-DOCUSATE SODIUM 8.6-50 MG PO TABS: 1.0000 | ORAL_TABLET | Freq: Every day | ORAL | Status: DC

## 2022-12-22 MED ORDER — MENTHOL 3 MG MT LOZG: 1.0000 | LOZENGE | OROMUCOSAL | Status: DC | PRN

## 2022-12-22 MED ORDER — DEXAMETHASONE SODIUM PHOSPHATE 10 MG/ML IJ SOLN
INTRAMUSCULAR | Status: DC | PRN
  Administered 2022-12-22: 5 mg via INTRAVENOUS

## 2022-12-22 MED ORDER — THROMBIN 5000 UNITS EX SOLR
CUTANEOUS | Status: AC
  Filled 2022-12-22: qty 5000

## 2022-12-22 MED ORDER — TRAMADOL HCL 50 MG PO TABS
50.0000 mg | ORAL_TABLET | Freq: Every evening | ORAL | Status: DC | PRN
  Administered 2022-12-22 – 2022-12-23 (×2): 50 mg via ORAL
  Filled 2022-12-22 (×2): qty 1

## 2022-12-22 MED ORDER — INSULIN ASPART 100 UNIT/ML IJ SOLN
0.0000 [IU] | Freq: Three times a day (TID) | INTRAMUSCULAR | Status: DC
  Administered 2022-12-23: 5 [IU] via SUBCUTANEOUS
  Administered 2022-12-23: 4 [IU] via SUBCUTANEOUS
  Administered 2022-12-23: 5 [IU] via SUBCUTANEOUS
  Administered 2022-12-24: 4 [IU] via SUBCUTANEOUS
  Administered 2022-12-24: 5 [IU] via SUBCUTANEOUS
  Administered 2022-12-24: 4 [IU] via SUBCUTANEOUS
  Administered 2022-12-25: 3 [IU] via SUBCUTANEOUS
  Administered 2022-12-25: 5 [IU] via SUBCUTANEOUS
  Administered 2022-12-25 – 2022-12-26 (×2): 7 [IU] via SUBCUTANEOUS
  Administered 2022-12-26: 11 [IU] via SUBCUTANEOUS
  Administered 2022-12-26: 5 [IU] via SUBCUTANEOUS
  Administered 2022-12-27: 7 [IU] via SUBCUTANEOUS
  Administered 2022-12-27: 4 [IU] via SUBCUTANEOUS

## 2022-12-22 MED ORDER — ALBUMIN HUMAN 5 % IV SOLN: INTRAVENOUS | Status: DC | PRN

## 2022-12-22 MED ORDER — ACETAMINOPHEN 325 MG PO TABS: 325.0000 mg | ORAL_TABLET | Freq: Once | ORAL | Status: DC | PRN

## 2022-12-22 MED ORDER — MIRABEGRON ER 25 MG PO TB24
25.0000 mg | ORAL_TABLET | Freq: Every day | ORAL | Status: DC
  Administered 2022-12-22 – 2022-12-27 (×6): 25 mg via ORAL
  Filled 2022-12-22 (×6): qty 1

## 2022-12-22 MED ORDER — TRAMADOL HCL 50 MG PO TABS: 50.0000 mg | ORAL_TABLET | Freq: Four times a day (QID) | ORAL | Status: DC | PRN

## 2022-12-22 MED ORDER — METHOCARBAMOL 1000 MG/10ML IJ SOLN: 500.0000 mg | Freq: Four times a day (QID) | INTRAVENOUS | Status: DC | PRN

## 2022-12-22 MED ORDER — PHENYLEPHRINE HCL-NACL 20-0.9 MG/250ML-% IV SOLN
INTRAVENOUS | Status: DC | PRN
  Administered 2022-12-22: 40 ug/min via INTRAVENOUS
  Administered 2022-12-22 (×2): 25 ug/min via INTRAVENOUS

## 2022-12-22 MED ORDER — ORAL CARE MOUTH RINSE: 15.0000 mL | Freq: Once | OROMUCOSAL | Status: AC

## 2022-12-22 MED ORDER — ALBUTEROL SULFATE HFA 108 (90 BASE) MCG/ACT IN AERS: 1.0000 | INHALATION_SPRAY | Freq: Four times a day (QID) | RESPIRATORY_TRACT | Status: DC | PRN

## 2022-12-22 MED ORDER — FENTANYL CITRATE (PF) 250 MCG/5ML IJ SOLN
INTRAMUSCULAR | Status: AC
  Filled 2022-12-22: qty 5

## 2022-12-22 MED ORDER — FUROSEMIDE 40 MG PO TABS
40.0000 mg | ORAL_TABLET | Freq: Every day | ORAL | Status: DC
  Administered 2022-12-22 – 2022-12-27 (×6): 40 mg via ORAL
  Filled 2022-12-22 (×6): qty 1

## 2022-12-22 MED ORDER — ONDANSETRON HCL 4 MG/2ML IJ SOLN
INTRAMUSCULAR | Status: DC | PRN
  Administered 2022-12-22: 4 mg via INTRAVENOUS

## 2022-12-22 MED ORDER — DEXAMETHASONE SODIUM PHOSPHATE 10 MG/ML IJ SOLN
INTRAMUSCULAR | Status: AC
  Filled 2022-12-22: qty 1

## 2022-12-22 MED ORDER — DAPAGLIFLOZIN PROPANEDIOL 10 MG PO TABS
10.0000 mg | ORAL_TABLET | Freq: Every day | ORAL | Status: DC
  Filled 2022-12-22: qty 1

## 2022-12-22 MED ORDER — TRAZODONE HCL 50 MG PO TABS
50.0000 mg | ORAL_TABLET | Freq: Every day | ORAL | Status: DC
  Administered 2022-12-22 – 2022-12-26 (×5): 50 mg via ORAL
  Filled 2022-12-22 (×5): qty 1

## 2022-12-22 MED ORDER — SODIUM CHLORIDE 0.9 % IV SOLN
250.0000 mL | INTRAVENOUS | Status: DC
  Administered 2022-12-22: 250 mL via INTRAVENOUS

## 2022-12-22 MED ORDER — POLYETHYLENE GLYCOL 3350 17 GM/SCOOP PO POWD: 17.0000 g | Freq: Every day | ORAL | Status: DC | PRN

## 2022-12-22 MED ORDER — IPRATROPIUM-ALBUTEROL 0.5-2.5 (3) MG/3ML IN SOLN: 3.0000 mL | Freq: Four times a day (QID) | RESPIRATORY_TRACT | Status: DC | PRN

## 2022-12-22 MED ORDER — INSULIN ASPART FLEXPEN 100 UNIT/ML ~~LOC~~ SOPN: 4.0000 [IU] | PEN_INJECTOR | Freq: Three times a day (TID) | SUBCUTANEOUS | Status: DC

## 2022-12-22 MED ORDER — BUPIVACAINE HCL (PF) 0.5 % IJ SOLN
INTRAMUSCULAR | Status: DC | PRN
  Administered 2022-12-22: 5 mL
  Administered 2022-12-22: 25 mL

## 2022-12-22 MED ORDER — LIDOCAINE-EPINEPHRINE 1 %-1:100000 IJ SOLN
INTRAMUSCULAR | Status: AC
  Filled 2022-12-22: qty 1

## 2022-12-22 MED ORDER — HYDROMORPHONE HCL 1 MG/ML IJ SOLN: 0.2500 mg | INTRAMUSCULAR | Status: DC | PRN

## 2022-12-22 MED ORDER — PROPOFOL 10 MG/ML IV BOLUS
INTRAVENOUS | Status: AC
  Filled 2022-12-22: qty 20

## 2022-12-22 SURGICAL SUPPLY — 48 items
ADH SKN CLS APL DERMABOND .7 (GAUZE/BANDAGES/DRESSINGS) ×1
BAG COUNTER SPONGE SURGICOUNT (BAG) ×1 IMPLANT
BAG SPNG CNTER NS LX DISP (BAG) ×1
BAND INSRT 18 STRL LF DISP RB (MISCELLANEOUS)
BAND RUBBER #18 3X1/16 STRL (MISCELLANEOUS) IMPLANT
BLADE CLIPPER SURG (BLADE) IMPLANT
BUR ACORN 6.0 (BURR) IMPLANT
BUR MATCHSTICK NEURO 3.0 LAGG (BURR) ×1 IMPLANT
CANISTER SUCT 3000ML PPV (MISCELLANEOUS) ×1 IMPLANT
DERMABOND ADVANCED .7 DNX12 (GAUZE/BANDAGES/DRESSINGS) ×1 IMPLANT
DRAPE HALF SHEET 40X57 (DRAPES) IMPLANT
DRAPE LAPAROTOMY 100X72X124 (DRAPES) ×1 IMPLANT
DRAPE MICROSCOPE SLANT 54X150 (MISCELLANEOUS) IMPLANT
DURAPREP 26ML APPLICATOR (WOUND CARE) ×1 IMPLANT
ELECT REM PT RETURN 9FT ADLT (ELECTROSURGICAL) ×1
ELECTRODE REM PT RTRN 9FT ADLT (ELECTROSURGICAL) ×1 IMPLANT
GAUZE 4X4 16PLY ~~LOC~~+RFID DBL (SPONGE) IMPLANT
GAUZE SPONGE 4X4 12PLY STRL (GAUZE/BANDAGES/DRESSINGS) ×1 IMPLANT
GLOVE BIOGEL PI IND STRL 8.5 (GLOVE) ×1 IMPLANT
GLOVE ECLIPSE 8.5 STRL (GLOVE) ×1 IMPLANT
GOWN STRL REUS W/ TWL LRG LVL3 (GOWN DISPOSABLE) IMPLANT
GOWN STRL REUS W/ TWL XL LVL3 (GOWN DISPOSABLE) IMPLANT
GOWN STRL REUS W/TWL 2XL LVL3 (GOWN DISPOSABLE) ×1 IMPLANT
GOWN STRL REUS W/TWL LRG LVL3 (GOWN DISPOSABLE)
GOWN STRL REUS W/TWL XL LVL3 (GOWN DISPOSABLE)
HEMOSTAT POWDER KIT SURGIFOAM (HEMOSTASIS) ×1 IMPLANT
KIT BASIN OR (CUSTOM PROCEDURE TRAY) ×1 IMPLANT
KIT TURNOVER KIT B (KITS) ×1 IMPLANT
NDL SPNL 20GX3.5 QUINCKE YW (NEEDLE) IMPLANT
NEEDLE HYPO 22GX1.5 SAFETY (NEEDLE) ×1 IMPLANT
NEEDLE SPNL 20GX3.5 QUINCKE YW (NEEDLE) IMPLANT
NS IRRIG 1000ML POUR BTL (IV SOLUTION) ×1 IMPLANT
PACK LAMINECTOMY NEURO (CUSTOM PROCEDURE TRAY) ×1 IMPLANT
PAD ARMBOARD 7.5X6 YLW CONV (MISCELLANEOUS) ×3 IMPLANT
PATTIES SURGICAL .5 X1 (DISPOSABLE) ×1 IMPLANT
SOL ELECTROSURG ANTI STICK (MISCELLANEOUS)
SOLUTION ELECTROSURG ANTI STCK (MISCELLANEOUS) ×1 IMPLANT
SPIKE FLUID TRANSFER (MISCELLANEOUS) ×1 IMPLANT
SPONGE SURGIFOAM ABS GEL SZ50 (HEMOSTASIS) IMPLANT
SUT VIC AB 1 CT1 18XBRD ANBCTR (SUTURE) ×1 IMPLANT
SUT VIC AB 1 CT1 8-18 (SUTURE) ×1
SUT VIC AB 2-0 CP2 18 (SUTURE) ×1 IMPLANT
SUT VIC AB 3-0 SH 8-18 (SUTURE) ×1 IMPLANT
SUT VIC AB 4-0 RB1 18 (SUTURE) ×1 IMPLANT
TOWEL GREEN STERILE (TOWEL DISPOSABLE) ×1 IMPLANT
TOWEL GREEN STERILE FF (TOWEL DISPOSABLE) ×1 IMPLANT
TUBING FEATHERFLOW (TUBING) ×1 IMPLANT
WATER STERILE IRR 1000ML POUR (IV SOLUTION) ×1 IMPLANT

## 2022-12-22 NOTE — Transfer of Care (Signed)
Immediate Anesthesia Transfer of Care Note  Patient: Pamela [name]  Procedure(s) Performed: Lumbar Two-Lumbar Three, Lumbar Three-Lumbar Four Lumbar Decompression (Back)  Patient Location: PACU  Anesthesia Type:General  Level of Consciousness: awake, alert , oriented, patient cooperative, and responds to stimulation  Airway & Oxygen Therapy: Patient Spontanous Breathing and Patient connected to face mask oxygen  Post-op Assessment: Report given to RN, Post -op Vital signs reviewed and stable, and Patient moving all extremities X 4  Post vital signs: Reviewed and stable  Last Vitals:  Vitals Value Taken Time  BP    Temp    Pulse 89 12/22/22 0927  Resp 21 12/22/22 0927  SpO2 100 % 12/22/22 0927  Vitals shown include unvalidated device data.  Last Pain:  Vitals:   12/22/22 0621  PainSc: 6       Patients Stated Pain Goal: 1 (12/22/22 1610)  Complications: No notable events documented.

## 2022-12-22 NOTE — H&P (Signed)
Pamela [name] is an 77 y.o. female.   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  She also has chronic compression fractures of the L1-L4 vertebrae.  She has been advised regarding the need for surgical decompression.  Past Medical History:  Diagnosis Date   Anticoagulated on Coumadin    chronic--- managed by hematology/ oncology,   Asthma, mild intermittent    followed by pcp--- per pt last exacerbation winter 2021 w/ acute bronchitis   Bilateral leg cramps    Blood dyscrasia    Chronic constipation    CKD (chronic kidney disease), stage III    Closed bimalleolar fracture of left ankle 02/26/2020   DDD (degenerative disc disease), cervical    w/ spondylosis,  per pt last steroid injection 06/ 2022   DDD (degenerative disc disease), lumbosacral    DVT (deep venous thrombosis) 07/30/2013   Dyspnea    occasionally   Dysrhythmia    GERD (gastroesophageal reflux disease)    History of cardiac murmur as a child    History of DVT of lower extremity    left lower extremity in 1980s, fell when bowling   History of pulmonary embolus (PE) 1993   per pt left lung post op 2 wks cholecystectomy   History of rheumatic fever as a child    per last echo 12-31-2019 no valvular issues   History of TIA (transient ischemic attack)    2014 and 2018 or 2019,  per pt no residual   HTN (hypertension)    followed by pcp   Hyperlipidemia    Hypothyroidism    IDA (iron deficiency anemia)    Macular degeneration of both eyes    Mild obstructive sleep apnea    per pt dx 2017 tried to uses cpap but intolerant   Mixed incontinence urge and stress    urologist--- dr Saddie Benders   OA (osteoarthritis) 07/06/2018   Osteoporosis    PAF (paroxysmal atrial fibrillation) 10/01/2014   cardiologist--- dr Lavona Mound tobb;   cardiac cath 02-18-2013 normal coronaries arteries, ef 50%, cath done since echo showed ef  30-35%; nucleat stress study 04/ 2020 normal , normal echo 04/ 2021,  event monitor 09-14-2020 rare ST/AT variable block   Pneumonia    PONV (postoperative nausea and vomiting)    Protein S deficiency    followed by hemotology/ oncology-- dr d. Melvyn Neth ([facility]) dx 1980s;  prior DVT left lower leg 1980s and left lung PE 1993; chronic  coumadin since 1980s   PVC's (premature ventricular contractions)    followed by cardiology   S/P cardiac catheterization 02/2013   Normal coronaries; low normal EF at 50%   Solitary pulmonary nodule on lung CT 02/06/2019   First noted 01/13/2014 > no change as of 12/21/2018   Spondylolisthesis, lumbar region 08/09/2018   Stroke    TIA in 2018 or 2019   Transient ischemic attack 07/30/2013   Type 2 diabetes mellitus treated with insulin    endocrilogist--- whitney reardon NP     (03-10-2021  pt continuously checks blood sugar throughout the day w/ Libre, fasting sugar --- 69--200)   Wears glasses    Wears hearing aid in both ears     Past Surgical History:  Procedure Laterality Date   ABDOMINAL HYSTERECTOMY     BLEPHAROPLASTY     CARDIAC CATHETERIZATION  02/18/2013   @ [facility]  by Dr [place];  normal  coronaries w/ preserved LVF, ef 50%;   previous cath 2001 normal ef 65%   CARPAL TUNNEL RELEASE Bilateral 1994   CATARACT EXTRACTION W/ INTRAOCULAR LENS IMPLANT Bilateral 2017   CHOLECYSTECTOMY     CHOLECYSTECTOMY, LAPAROSCOPIC  1993   COLONOSCOPY     EAR BIOPSY Left    FINGER SURGERY Left 2018   thumb   FOOT SURGERY     FOOT TENDON SURGERY Right    early 2000s   FRACTURE SURGERY Left 02/13/2020   left femur   HAND SURGERY Right 04/2022   HARDWARE REMOVAL Left 03/12/2021   Procedure: HARDWARE REMOVAL;  Surgeon: Yolonda Kida, MD;  Location: [facility];  Service: Orthopedics;  Laterality: Left;  60 MINS   JOINT REPLACEMENT     ORIF ANKLE FRACTURE Left 02/26/2020   Procedure: OPEN REDUCTION INTERNAL FIXATION  (ORIF) ANKLE FRACTURE;  Surgeon: Yolonda Kida, MD;  Location: [facility];  Service: Orthopedics;  Laterality: Left;  90 mins   TONSILLECTOMY AND ADENOIDECTOMY Bilateral    TOTAL KNEE ARTHROPLASTY Left 03/2005   TOTAL VAGINAL HYSTERECTOMY  1988   per pt still has ovaries    Family History  Problem Relation Age of Onset   Cirrhosis Mother    Antithrombin III deficiency Mother        multiple emboli   Ulcerative colitis Mother    Diabetes Father    Coronary artery disease Father    Heart attack Father    Hypertension Father    Kidney disease Father    Hypertension Sister    Diabetes Sister    Heart attack Sister        age 95    Protein S deficiency Daughter    Heart attack Brother    Hypertension Brother    Lung cancer Brother    Stroke Neg Hx    Colitis Neg Hx    Colon polyps Neg Hx    Esophageal cancer Neg Hx    Liver cancer Neg Hx    Pancreatic cancer Neg Hx    Rectal cancer Neg Hx    Stomach cancer Neg Hx    Breast cancer Neg Hx    Social History:  reports that she has never smoked. She has never used smokeless tobacco. She reports that she does not drink alcohol and does not use drugs.  Allergies:  Allergies  Allergen Reactions   Ketek [Telithromycin] Nausea And Vomiting and Rash   Loxapine Succinate Hives   Naproxen Sodium Anaphylaxis and Hives   Amoxapine And Related Hives   Darvon Nausea And Vomiting   Belsomra [Suvorexant] Other (See Comments)    "Nightmares"   Cymbalta [Duloxetine Hcl] Other (See Comments)    "Blackouts"   Duloxetine    Gabapentin Other (See Comments)    Blurry vision   Lyrica [Pregabalin]     Makes her sedated   Ozempic (0.25 Or 0.5 Mg-Dose) [Semaglutide(0.25 Or 0.5mg -Dos)] Diarrhea   Semaglutide Diarrhea    Rybelsus   Amoxicillin Rash   Propoxyphene Nausea And Vomiting    Medications Prior to Admission  Medication Sig Dispense Refill   acetaminophen (TYLENOL) 650 MG CR tablet Take 650-1,300 mg by mouth every 8 (eight)  hours as needed for pain.     amLODipine (NORVASC) 5 MG tablet TAKE 1 TABLET(5 MG) BY MOUTH DAILY 90 tablet 3   aspirin EC (ASPIRIN LOW DOSE) 81 MG tablet TAKE 1 TABLET(81 MG) BY MOUTH DAILY 90 tablet 3   atorvastatin (LIPITOR) 80  MG tablet TAKE 1 TABLET(80 MG) BY MOUTH DAILY 90 tablet 3   Calcium Citrate-Vitamin D (CALCIUM CITRATE + PO) Take 1 tablet by mouth daily.     carboxymethylcellulose (REFRESH PLUS) 0.5 % SOLN Place 1 drop into both eyes 3 (three) times daily as needed (dry eyes).     Cholecalciferol (VITAMIN D3) 50 MCG (2000 UT) TABS Take 4,000 Units by mouth daily with lunch.     CINNAMON PO Take 1,000 mg by mouth daily.     clindamycin (CLEOCIN) 300 MG capsule Take 300 mg by mouth See admin instructions. 1 hour before dental procedures     cyanocobalamin (VITAMIN B12) 1000 MCG tablet Take 1,000 mcg by mouth daily.     dapagliflozin propanediol (FARXIGA) 10 MG TABS tablet Take 1 tablet (10 mg total) by mouth daily before breakfast. 90 tablet 3   estradiol (ESTRACE) 0.1 MG/GM vaginal cream Place 1 Applicatorful vaginally 2 (two) times a week. As needed     ferrous sulfate 325 (65 FE) MG tablet Take 325 mg by mouth daily with lunch.     furosemide (LASIX) 40 MG tablet Take 1 tablet (40 mg total) by mouth daily. 90 tablet 3   GEMTESA 75 MG TABS Take 75 mg by mouth at bedtime.     Insulin Aspart FlexPen (NOVOLOG) 100 UNIT/ML Max daily 30 units (Patient taking differently: Inject 4-5 Units into the skin 3 (three) times daily with meals. Max daily 30 units) 15 mL 6   insulin glargine, 1 Unit Dial, (TOUJEO) 300 UNIT/ML Solostar Pen Inject 8 Units into the skin at bedtime. (Patient taking differently: Inject 6 Units into the skin at bedtime.) 15 mL 3   Insulin Pen Needle (BD PEN NEEDLE NANO 2ND GEN) 32G X 4 MM MISC USE DAILY AS DIRECTED 200 each 3   isosorbide mononitrate (IMDUR) 30 MG 24 hr tablet 1 tablet by mouth daily, 2nd attempt, please reach arrange appt for further refills 90 tablet 3    levothyroxine (SYNTHROID) 25 MCG tablet Take 1 tablet (25 mcg total) by mouth daily before breakfast. 90 tablet 1   lisinopril (ZESTRIL) 20 MG tablet TAKE 2 TABLETS(40 MG) BY MOUTH TWICE DAILY 180 tablet 3   magnesium oxide (MAG-OX) 400 (240 Mg) MG tablet TAKE 2 TABLETS(800 MG) BY MOUTH TWICE DAILY (Patient taking differently: Take 800 mg by mouth 2 (two) times daily.) 120 tablet 2   methocarbamol (ROBAXIN) 750 MG tablet Take 750 mg by mouth at bedtime.     Multiple Vitamins-Minerals (PRESERVISION AREDS PO) Take 1 capsule by mouth 2 (two) times daily.     nystatin cream (MYCOSTATIN) Apply 1 Application topically 4 (four) times daily as needed for dry skin (yeast infection).     ondansetron (ZOFRAN-ODT) 8 MG disintegrating tablet Take 8 mg by mouth every 8 (eight) hours as needed for nausea or vomiting.      pantoprazole (PROTONIX) 40 MG tablet TAKE 1 TABLET(40 MG) BY MOUTH EVERY MORNING 90 tablet 3   pyridoxine (B-6) 100 MG tablet Take 100 mg by mouth daily.     sennosides-docusate sodium (SENOKOT-S) 8.6-50 MG tablet Take 1 tablet by mouth daily.     sodium chloride (OCEAN) 0.65 % SOLN nasal spray Place 1 spray into both nostrils as needed for congestion.     traMADol (ULTRAM) 50 MG tablet Take 50-100 mg by mouth every 6 (six) hours as needed.     traZODone (DESYREL) 50 MG tablet Take 50 mg by mouth at bedtime.  warfarin (COUMADIN) 1 MG tablet Take 1 mg by mouth every evening. Take with 5 mg tab to get 6 mg     warfarin (COUMADIN) 5 MG tablet Take 5 mg by mouth every evening. Take with 1 mg tab to get 6 mg     albuterol (VENTOLIN HFA) 108 (90 Base) MCG/ACT inhaler INHALE 1 TO 2 PUFFS INTO THE LUNGS EVERY 6 HOURS AS NEEDED FOR WHEEZING OR SHORTNESS OF BREATH 6.7 g 3   Continuous Blood Gluc Sensor (FREESTYLE LIBRE 2 SENSOR) MISC 2 each by Does not apply route every 14 (fourteen) days. E11.40 6 each 3   denosumab (PROLIA) 60 MG/ML SOSY injection Inject 60 mg into the skin every 6 (six) months.      enoxaparin (LOVENOX) 40 MG/0.4ML injection Inject 0.4 mLs (40 mg total) into the skin every 12 (twelve) hours. (Patient not taking: Reported on 12/20/2022) 20 mL 0   ipratropium-albuterol (DUONEB) 0.5-2.5 (3) MG/3ML SOLN Take 3 mLs by nebulization 4 (four) times daily as needed (wheezing/shortness of breath).  0   polyethylene glycol powder (MIRALAX) 17 GM/SCOOP powder Take 17 g by mouth daily as needed for moderate constipation.      Results for orders placed or performed during the hospital encounter of 12/22/22 (from the past 48 hour(s))  Glucose, capillary     Status: Abnormal   Collection Time: 12/22/22  6:15 AM  Result Value Ref Range   Glucose-Capillary 164 (H) 70 - 99 mg/dL    Comment: Glucose reference range applies only to samples taken after fasting for at least 8 hours.  Protime-INR     Status: None   Collection Time: 12/22/22  6:45 AM  Result Value Ref Range   Prothrombin Time 14.2 11.4 - 15.2 seconds   INR 1.1 0.8 - 1.2    Comment: (NOTE) INR goal varies based on device and disease states. Performed at [facility], [address] [place], [place] [address]   APTT     Status: None   Collection Time: 12/22/22  6:45 AM  Result Value Ref Range   aPTT 26 24 - 36 seconds    Comment: Performed at [facility], [address], [place], [place] [address]   No results found.  Review of Systems  Constitutional:  Positive for activity change.  Musculoskeletal:  Positive for back pain, gait problem and myalgias.  Neurological:  Positive for weakness and numbness.  All other systems reviewed and are negative.   Blood pressure (!) 180/58, pulse 75, temperature 97.6 F (36.4 C), resp. rate 18, height 5' (1.524 m), weight 68.9 kg, SpO2 97 %. Physical Exam Constitutional:      Appearance: Normal appearance.  HENT:     Head: Normocephalic and atraumatic.     Right Ear: Tympanic membrane, ear canal and external ear normal.     Left Ear: Tympanic membrane, ear canal  and external ear normal.     Nose: Nose normal.     Mouth/Throat:     Mouth: Mucous membranes are moist.     Pharynx: Oropharynx is clear.  Eyes:     Extraocular Movements: Extraocular movements intact.     Conjunctiva/sclera: Conjunctivae normal.     Pupils: Pupils are equal, round, and reactive to light.  Cardiovascular:     Rate and Rhythm: Normal rate and regular rhythm.     Pulses: Normal pulses.     Heart sounds: Normal heart sounds.  Pulmonary:     Effort: Pulmonary  effort is normal.     Breath sounds: Normal breath sounds.  Abdominal:     General: Abdomen is flat. Bowel sounds are normal.     Palpations: Abdomen is soft.  Musculoskeletal:        General: Normal range of motion.     Cervical back: Normal range of motion and neck supple.  Skin:    General: Skin is warm and dry.     Capillary Refill: Capillary refill takes less than 2 seconds.  Neurological:     Mental Status: She is alert.     Comments: Extremity weakness in the iliopsoas quadricep tibialis anterior and gastrocs graded at minus 4 out of 5  Psychiatric:        Mood and Affect: Mood normal.        Behavior: Behavior normal.        Thought Content: Thought content normal.        Judgment: Judgment normal.      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  Plan bilateral laminotomies and foraminotomies L2-3 and L3-4.  Stefani Dama, MD 12/22/2022, 7:26 AM

## 2022-12-22 NOTE — Progress Notes (Signed)
Dr. Danielle Dess made aware of and assessed hematoma on patient's lower abdomen.

## 2022-12-22 NOTE — Progress Notes (Signed)
Pharmacy Antibiotic Note  Legend Pamela [name] is a 77 y.o. female admitted on 12/22/2022 with Laminectomy L2-3 and L3-4 with decompression. Pharmacy has been consulted for vancomycin dosing for surgical ppx. No drain placed during procedure. Scr 1.22. Based on estimated AUC, next dose due in 48hr.  Plan: Due to decreased renal function, previous vancomycin  IV x1 dose given 4/18 @ 0700 sufficient for surgical prophylaxis, no further doses needed.  Thank you for allowing pharmacy to be a part of this patient's care.  Carrington Clamp 12/22/2022 1:29 PM

## 2022-12-22 NOTE — Anesthesia Procedure Notes (Addendum)
Procedure Name: Intubation Date/Time: 12/22/2022 7:43 AM  Performed by: Shary Decamp, CRNAPre-anesthesia Checklist: Patient identified, Patient being monitored, Timeout performed, Emergency Drugs available and Suction available Patient Re-evaluated:Patient Re-evaluated prior to induction Oxygen Delivery Method: Circle System Utilized Preoxygenation: Pre-oxygenation with 100% oxygen Induction Type: IV induction Ventilation: Mask ventilation without difficulty Laryngoscope Size: Miller and 2 Grade View: Grade I Tube type: Oral Tube size: 7.0 mm Number of attempts: 1 Airway Equipment and Method: Stylet Placement Confirmation: ETT inserted through vocal cords under direct vision, positive ETCO2 and breath sounds checked- equal and bilateral Secured at: 21 cm Tube secured with: Tape Dental Injury: Teeth and Oropharynx as per pre-operative assessment

## 2022-12-22 NOTE — Anesthesia Postprocedure Evaluation (Signed)
Anesthesia Post Note  Patient: Pamela [name]  Procedure(s) Performed: Lumbar Two-Lumbar Three, Lumbar Three-Lumbar Four Lumbar Decompression (Back)     Patient location during evaluation: PACU Anesthesia Type: General Level of consciousness: awake and alert Pain management: pain level controlled Vital Signs Assessment: post-procedure vital signs reviewed and stable Respiratory status: spontaneous breathing, nonlabored ventilation, respiratory function stable and patient connected to nasal cannula oxygen Cardiovascular status: blood pressure returned to baseline and stable Postop Assessment: no apparent nausea or vomiting Anesthetic complications: no  No notable events documented.  Last Vitals:  Vitals:   12/22/22 1305 12/22/22 1306  BP: (!) 171/51   Pulse: (!) 39 70  Resp: 18   Temp: (!) 36.4 C   SpO2: 100% 98%    Last Pain:  Vitals:   12/22/22 1305  TempSrc: Oral  PainSc:                  [name]

## 2022-12-22 NOTE — Op Note (Signed)
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 Postoperative diagnosis: Same Procedure: Laminectomy L2-3 and L3-4 with decompression of the central canal and lateral recesses. Surgeon: Barnett Abu First Assistant: Hoyt Koch, MD Anesthesia: General endotracheal Indications: Patient is a 77 year old individual whose had significant spondylitic disease in the lower lumbar spine.  He is also had some subacute fractures of L1-L2 L4.  She has severe spinal stenosis at L3-4 and moderately severe stenosis at L3-3 mild stenosis at L4-5.  She has been having primarily neurogenic claudication symptoms along with lumbar radiculopathy and was advised regarding surgical decompression of the stenosis at L3-4 and 2 3.  Procedure the patient was brought to the operating room supine on the stretcher.  After the smooth induction of general endotracheal anesthesia, she was carefully turned prone.  The back was prepped with alcohol DuraPrep and draped in a sterile fashion.  Midline incision was created  and this was taken down to the lumbodorsal fascia was opened on the left side of midline.  A subperiosteal dissection was then carried out at L2-3 and L3-4.  Self-retaining McCullough retractor was placed in the wound and then localizing radiographs identify and confirm the space at L3-4 and L2-3.  Laminotomy was then created removing the inferior margin lamina of L3 out to the medial wall facet partial medial facetectomy was performed.  The thickened redundant ligament was taken up and ultimately the dura was identified in the midline and dissection was then carried out to the lateral recess where substantial hypertrophy of the ligament of the facet joint was taken down.  This allowed for good decompression of the left side.  Then by extending the dissection across the midline to the right side a sublaminar decompression was  performed removing thickened redundant ligament from the facet joint on the right side at the L3-4 level.  In the end the common dural tube takeoff of the L4 nerve root inferiorly and L3 nerve root superiorly was well decompressed.  Attention was then turned to L2-3 where similar process was carried out here that compression was not nearly as severe partial medial facetectomy was performed the ligament was taken up.  The common dural tube was decompressed on both sides through this left-sided approach and the both the L3 nerve roots inferiorly and L2 nerve root superiorly could be palpated easily.  With this hemostasis was achieved and then the lumbodorsal fascia was closed with #1 Vicryl in interrupted fashion 2-0 Vicryl was used in subcutaneous tissues 3-0 Vicryl subcuticularly and Dermabond was placed on the skin.  Blood loss was noted to be 25 cc for the entire procedure.  Dr. Maisie Fus helped during the critical portions of the procedure including localizing the appropriate levels with retraction of the dura while I worked on the ipsilateral side and then decompression of the contralateral side was aided in great effort by him at both L2-3 and L3-4.

## 2022-12-23 ENCOUNTER — Encounter (HOSPITAL_COMMUNITY): Payer: Self-pay | Admitting: Neurological Surgery

## 2022-12-23 DIAGNOSIS — E039 Hypothyroidism, unspecified: Secondary | ICD-10-CM | POA: Diagnosis not present

## 2022-12-23 DIAGNOSIS — N183 Chronic kidney disease, stage 3 unspecified: Secondary | ICD-10-CM | POA: Diagnosis not present

## 2022-12-23 DIAGNOSIS — I480 Paroxysmal atrial fibrillation: Secondary | ICD-10-CM | POA: Diagnosis not present

## 2022-12-23 DIAGNOSIS — E1122 Type 2 diabetes mellitus with diabetic chronic kidney disease: Secondary | ICD-10-CM | POA: Diagnosis not present

## 2022-12-23 DIAGNOSIS — M5416 Radiculopathy, lumbar region: Secondary | ICD-10-CM | POA: Diagnosis not present

## 2022-12-23 DIAGNOSIS — M48062 Spinal stenosis, lumbar region with neurogenic claudication: Secondary | ICD-10-CM | POA: Diagnosis not present

## 2022-12-23 DIAGNOSIS — Z86718 Personal history of other venous thrombosis and embolism: Secondary | ICD-10-CM | POA: Diagnosis not present

## 2022-12-23 DIAGNOSIS — J45909 Unspecified asthma, uncomplicated: Secondary | ICD-10-CM | POA: Diagnosis not present

## 2022-12-23 DIAGNOSIS — Z8673 Personal history of transient ischemic attack (TIA), and cerebral infarction without residual deficits: Secondary | ICD-10-CM | POA: Diagnosis not present

## 2022-12-23 LAB — GLUCOSE, CAPILLARY
Glucose-Capillary: 134 mg/dL — ABNORMAL HIGH (ref 70–99)
Glucose-Capillary: 178 mg/dL — ABNORMAL HIGH (ref 70–99)
Glucose-Capillary: 189 mg/dL — ABNORMAL HIGH (ref 70–99)
Glucose-Capillary: 204 mg/dL — ABNORMAL HIGH (ref 70–99)
Glucose-Capillary: 213 mg/dL — ABNORMAL HIGH (ref 70–99)

## 2022-12-23 MED ORDER — TRAMADOL HCL 50 MG PO TABS
50.0000 mg | ORAL_TABLET | Freq: Four times a day (QID) | ORAL | Status: DC | PRN
  Administered 2022-12-24: 50 mg via ORAL
  Filled 2022-12-23 (×2): qty 1

## 2022-12-23 NOTE — TOC Progression Note (Signed)
Transition of Care [facility]) - Progression Note    Patient Details  Name: Pamela [name] MRN: [id number] Date of Birth: 06-14-1946  [facility] CM/SW Contact  Lorri Frederick, LCSW Phone Number: 12/23/2022, 3:20 PM  Clinical Narrative:   TC [facility].  She can offer a bed at [facility] but does not have bed available today.  May be Monday before bed is available.      Expected Discharge Plan: Skilled Nursing Facility Barriers to Discharge: Continued Medical Work up, English as a second language teacher  Expected Discharge Plan and Services     Post Acute Care Choice: Skilled Nursing Facility Living arrangements for the past 2 months: Single Family Home                                       Social Determinants of Health (SDOH) Interventions SDOH Screenings   Food Insecurity: No Food Insecurity (12/10/2022)  Housing: Low Risk  (12/10/2022)  Transportation Needs: No Transportation Needs (12/10/2022)  Depression (PHQ2-9): Low Risk  (12/14/2022)  Financial Resource Strain: Low Risk  (12/10/2022)  Physical Activity: Inactive (12/10/2022)  Social Connections: Unknown (12/10/2022)  Stress: No Stress Concern Present (12/10/2022)  Tobacco Use: Low Risk  (12/23/2022)    Readmission Risk Interventions     No data to display

## 2022-12-23 NOTE — Care Management Obs Status (Signed)
MEDICARE OBSERVATION STATUS NOTIFICATION   Patient Details  Name: Pamela [name] MRN: [id number] Date of Birth: Jun 24, 1946   Medicare Observation Status Notification Given:  Yes    Baldemar Lenis, LCSW 12/23/2022, 1:23 PM

## 2022-12-23 NOTE — Evaluation (Signed)
Physical Therapy Evaluation  Patient Details Name: Pamela [name] MRN: [id number] DOB: [birth year] Today's Date: 12/23/2022  History of Present Illness  77 y.o. female admitted on 12/22/2022 with c/o lower extremity pain and weakeness. She is s/p Laminectomy L2-3 and L3-4 with decompression. PMH of MD, GER, HTN, OA, Osteoporosis, CKD, DDD, Hand surgery, ORIF L ankle, TKA   Clinical Impression  Pt admitted with above diagnosis. At the time of PT eval, pt was able to demonstrate transfers and ambulation with gross min guard assist to min assist and RW for support. Recommend stability of rolling walker instead of the rollator at this time and educated pt/family. Pt was educated on precautions, positioning recommendations, appropriate activity progression, and car transfer. Pt currently with functional limitations due to the deficits listed below (see PT Problem List). Pt will benefit from skilled PT to increase their independence and safety with mobility to allow discharge to the venue listed below.         Recommendations for follow up therapy are one component of a multi-disciplinary discharge planning process, led by the attending physician.  Recommendations may be updated based on patient status, additional functional criteria and insurance authorization.  Follow Up Recommendations Can patient physically be transported by private vehicle: Yes     Assistance Recommended at Discharge Frequent or constant Supervision/Assistance  Patient can return home with the following  A little help with walking and/or transfers;A little help with bathing/dressing/bathroom;Assistance with cooking/housework;Assist for transportation;Help with stairs or ramp for entrance    Equipment Recommendations None recommended by PT  Recommendations for Other Services       Functional Status Assessment Patient has had a recent decline in their functional status and demonstrates the ability to make  significant improvements in function in a reasonable and predictable amount of time.     Precautions / Restrictions Precautions Precautions: Back;Fall Precaution Booklet Issued: Yes (comment) Precaution Comments: Reviewed handout and pt was cued for precautions during functional mobility. Restrictions Weight Bearing Restrictions: No      Mobility  Bed Mobility Overal bed mobility: Needs Assistance Bed Mobility: Rolling, Sidelying to Sit, Sit to Sidelying Rolling: Min guard Sidelying to sit: Min assist Supine to sit: Min guard     General bed mobility comments: VC's throughout for optimal log roll technique. Assist for trunk elevation to full sitting position.    Transfers Overall transfer level: Needs assistance Equipment used: Rolling walker (2 wheels) Transfers: Sit to/from Stand Sit to Stand: Min guard           General transfer comment: Hands on guarding for safety as pt powered up to full stand.    Ambulation/Gait Ambulation/Gait assistance: Min guard Gait Distance (Feet): 200 Feet Assistive device: Rolling walker (2 wheels) Gait Pattern/deviations: Step-through pattern, Decreased stride length, Trunk flexed Gait velocity: Decreased Gait velocity interpretation: <1.31 ft/sec, indicative of household ambulator   General Gait Details: Slow and guarded due to pain. No assist required. Hands on guarding throughout and VC's for improved posture, closer walker proximity and forward gaze.  Stairs            Wheelchair Mobility    Modified Rankin (Stroke Patients Only)       Balance Overall balance assessment: Needs assistance Sitting-balance support: Feet supported Sitting balance-Leahy Scale: Fair     Standing balance support: During functional activity, Reliant on assistive device for balance, Bilateral upper extremity supported Standing balance-Leahy Scale: Poor  Pertinent Vitals/Pain Pain  Assessment Pain Assessment: 0-10 Pain Score: 8  Pain Location: Headache and Back incision site. Reported as pulling sensation Pain Descriptors / Indicators: Aching, Headache, Operative site guarding, Tightness Pain Intervention(s): Limited activity within patient's tolerance, Monitored during session, Repositioned    Home Living Family/patient expects to be discharged to:: Private residence Living Arrangements: Spouse/significant other Available Help at Discharge: Family;Available PRN/intermittently (husband unable to provide physical assist, daughter will be available intermittently) Type of Home: House Home Access: Level entry     Alternate Level Stairs-Number of Steps: 1/2 step to get in from garage Home Layout: Two level;Able to live on main level with bedroom/bathroom (basement downstairs that they do not use) Home Equipment: Rolling Walker (2 wheels);Rollator (4 wheels);BSC/3in1;Adaptive equipment;Shower seat;Other (comment);Cane - single point (Adjustable bed)      Prior Function Prior Level of Function : Independent/Modified Independent             Mobility Comments: Pt Mod I using Rollator and SPC ADLs Comments: Pt reports being independent in bADLs/iADLs     Hand Dominance        Extremity/Trunk Assessment   Upper Extremity Assessment Upper Extremity Assessment: Generalized weakness RUE Deficits / Details: RUE limited to 90 degrees shoulder flexion at baseline    Lower Extremity Assessment Lower Extremity Assessment: Generalized weakness    Cervical / Trunk Assessment Cervical / Trunk Assessment: Back Surgery  Communication   Communication: No difficulties  Cognition Arousal/Alertness: Awake/alert Behavior During Therapy: WFL for tasks assessed/performed Overall Cognitive Status: Within Functional Limits for tasks assessed                                          General Comments General comments (skin integrity, edema, etc.): VSS on  RA    Exercises     Assessment/Plan    PT Assessment Patient needs continued PT services  PT Problem List Decreased strength;Decreased activity tolerance;Decreased balance;Decreased mobility;Decreased knowledge of use of DME;Decreased safety awareness;Decreased knowledge of precautions;Pain       PT Treatment Interventions DME instruction;Gait training;Stair training;Functional mobility training;Therapeutic activities;Therapeutic exercise;Balance training;Patient/family education    PT Goals (Current goals can be found in the Care Plan section)  Acute Rehab PT Goals Patient Stated Goal: Be able to return home PT Goal Formulation: With patient/family Time For Goal Achievement: 12/30/22 Potential to Achieve Goals: Good    Frequency Min 5X/week     Co-evaluation               AM-PAC PT "6 Clicks" Mobility  Outcome Measure Help needed turning from your back to your side while in a flat bed without using bedrails?: A Little Help needed moving from lying on your back to sitting on the side of a flat bed without using bedrails?: A Little Help needed moving to and from a bed to a chair (including a wheelchair)?: A Little Help needed standing up from a chair using your arms (e.g., wheelchair or bedside chair)?: A Little Help needed to walk in hospital room?: A Little Help needed climbing 3-5 steps with a railing? : A Little 6 Click Score: 18    End of Session Equipment Utilized During Treatment: Gait belt Activity Tolerance: Patient limited by pain Patient left: in bed;with call bell/phone within reach;with SCD's reapplied;with family/visitor present Nurse Communication: Mobility status PT Visit Diagnosis: Unsteadiness on feet (R26.81);Pain Pain - part of body:  (  back)    Time: 1610-9604 PT Time Calculation (min) (ACUTE ONLY): 26 min   Charges:   PT Evaluation $PT Eval Low Complexity: 1 Low PT Treatments $Gait Training: 8-22 mins        Conni Slipper, PT,  DPT Acute Rehabilitation Services Secure Chat Preferred Office: [phone]   Marylynn Pearson 12/23/2022, 12:27 PM

## 2022-12-23 NOTE — Plan of Care (Signed)
  Problem: Education: Goal: Knowledge of General Education information will improve Description: Including pain rating scale, medication(s)/side effects and non-pharmacologic comfort measures Outcome: Progressing   Problem: Activity: Goal: Risk for activity intolerance will decrease Outcome: Progressing   

## 2022-12-23 NOTE — Evaluation (Signed)
Occupational Therapy Evaluation Patient Details Name: Pamela [name] MRN: [id number] DOB: May 27, 1946 Today's Date: 12/23/2022   History of Present Illness 77 y.o. female admitted on 12/22/2022 with c/o lower extremity pain and weakeness. She is s/p Laminectomy L2-3 and L3-4 with decompression. PMH of MD, GER, HTN, OA, Osteoporosis, CKD, DDD, Hand surgery, ORIF L ankle, TKA   Clinical Impression   Pt admitted for above dx, PTA patient lived with husband who is unable to provide physical assist but daughter will be around intermittently although she lives in a different city and will need to return occassionally. PTA patient was also ambulating Mod I using Rollator or SPC and reports independence in bADLs/iADLs. Educated patient on POB precautions and Pt verbalized and demonstrated understanding with functional activity and transfers throughout session. Pt currently presenting with high levels of pain with functional mobility and fear of falling. Pt would benefit from continued acute skilled OT services to address above deficits and help transition to next level of care. Due to decreased level of home support and patient's current level of need for assist,patient would benefit from post acute skilled rehab facility with <3 hours of therapy and 24/7 support      Recommendations for follow up therapy are one component of a multi-disciplinary discharge planning process, led by the attending physician.  Recommendations may be updated based on patient status, additional functional criteria and insurance authorization.   Assistance Recommended at Discharge Frequent or constant Supervision/Assistance  Patient can return home with the following A little help with walking and/or transfers;A lot of help with bathing/dressing/bathroom;Help with stairs or ramp for entrance;Assist for transportation;Assistance with cooking/housework    Functional Status Assessment  Patient has had a recent decline  in their functional status and demonstrates the ability to make significant improvements in function in a reasonable and predictable amount of time.  Equipment Recommendations  Other (comment) (TBD at next level of care)    Recommendations for Other Services       Precautions / Restrictions Precautions Precautions: Back;Fall Precaution Booklet Issued: Yes (comment) Precaution Comments: No BLT Restrictions Weight Bearing Restrictions: No      Mobility Bed Mobility Overal bed mobility: Needs Assistance Bed Mobility: Supine to Sit, Sit to Supine     Supine to sit: Mod assist Sit to supine: Mod assist   General bed mobility comments: Pt demonstrated good sit>supine 2x after education on precautions, the second time she used bed rails + performed with Min guard but had a lot of pain with movement.    Transfers Overall transfer level: Needs assistance Equipment used: Rolling walker (2 wheels) Transfers: Sit to/from Stand Sit to Stand: Min guard                  Balance Overall balance assessment: Needs assistance Sitting-balance support: Feet supported Sitting balance-Leahy Scale: Fair     Standing balance support: During functional activity, Reliant on assistive device for balance, Bilateral upper extremity supported Standing balance-Leahy Scale: Poor Standing balance comment: RW                           ADL either performed or assessed with clinical judgement   ADL Overall ADL's : Needs assistance/impaired Eating/Feeding: Supervision/ safety;Sitting   Grooming: Sitting;Min guard   Upper Body Bathing: Sitting;Min guard   Lower Body Bathing: Moderate assistance;Sitting/lateral leans   Upper Body Dressing : Min guard;Sitting Upper Body Dressing Details (indicate cue type and reason): Pt donned  button up shirt while sitting EOB Lower Body Dressing: Maximal assistance;Sitting/lateral leans Lower Body Dressing Details (indicate cue type and reason):  Pt donned L sock using fig 4 while sitting EOB min guard, Max A to don R sock to maintain precautions Toilet Transfer: Min guard;Ambulation;Rolling walker (2 wheels)   Toileting- Clothing Manipulation and Hygiene: Min guard;Sitting/lateral lean   Tub/ Shower Transfer: Minimal assistance;Ambulation   Functional mobility during ADLs: Min guard;Rolling walker (2 wheels)       Vision         Perception     Praxis      Pertinent Vitals/Pain Pain Assessment Pain Assessment: 0-10 Pain Score: 6  Pain Location: Back incision site. Reported as pulling sensation Pain Descriptors / Indicators: Aching, Other (Comment) Pain Intervention(s): Limited activity within patient's tolerance, Monitored during session, Premedicated before session     Hand Dominance     Extremity/Trunk Assessment Upper Extremity Assessment Upper Extremity Assessment: RUE deficits/detail (LUE WFL) RUE Deficits / Details: RUE limited to 90 degrees shoulder flexion at baseline   Lower Extremity Assessment Lower Extremity Assessment: Generalized weakness   Cervical / Trunk Assessment Cervical / Trunk Assessment: Back Surgery   Communication Communication Communication: No difficulties   Cognition Arousal/Alertness: Awake/alert Behavior During Therapy: WFL for tasks assessed/performed Overall Cognitive Status: Within Functional Limits for tasks assessed                                       General Comments  VSS on RA    Exercises     Shoulder Instructions      Home Living Family/patient expects to be discharged to:: Private residence Living Arrangements: Spouse/significant other Available Help at Discharge: Family;Available PRN/intermittently (husband unable to provide physical assist, daughter will be available intermittently) Type of Home: House Home Access: Level entry     Home Layout: Two level;Able to live on main level with bedroom/bathroom (basement downstairs that they do  not use) Alternate Level Stairs-Number of Steps: 1/2 step to get in from garage   Bathroom Shower/Tub: Walk-in shower;Door (Small bathroom, tight walls)   Bathroom Toilet: Standard (BSC over toilet) Bathroom Accessibility: No   Home Equipment: Agricultural consultant (2 wheels);Rollator (4 wheels);BSC/3in1;Adaptive equipment;Shower seat;Other (comment);Cane - single point (Adjustable bed) Adaptive Equipment: Reacher        Prior Functioning/Environment Prior Level of Function : Independent/Modified Independent             Mobility Comments: Pt Mod I using Rollator and SPC ADLs Comments: Pt reports being independent in bADLs/iADLs        OT Problem List: Decreased strength;Impaired balance (sitting and/or standing);Pain      OT Treatment/Interventions: Therapeutic exercise;Self-care/ADL training;Energy conservation;Balance training;Patient/family education;Therapeutic activities;DME and/or AE instruction    OT Goals(Current goals can be found in the care plan section) Acute Rehab OT Goals Patient Stated Goal: To get pain under control OT Goal Formulation: With patient Time For Goal Achievement: 01/06/23 Potential to Achieve Goals: Good ADL Goals Pt Will Perform Grooming: standing;with supervision Pt Will Perform Lower Body Dressing: sitting/lateral leans;with supervision Pt Will Transfer to Toilet: with supervision;ambulating Additional ADL Goal #1: Pt will demonstrate 3/3 precautions with functional mobility  OT Frequency: Min 2X/week    Co-evaluation              AM-PAC OT "6 Clicks" Daily Activity     Outcome Measure Help from another person eating meals?: None  Help from another person taking care of personal grooming?: A Little Help from another person toileting, which includes using toliet, bedpan, or urinal?: A Little Help from another person bathing (including washing, rinsing, drying)?: A Lot Help from another person to put on and taking off regular upper body  clothing?: A Little Help from another person to put on and taking off regular lower body clothing?: A Lot 6 Click Score: 17   End of Session Equipment Utilized During Treatment: Gait belt;Rolling walker (2 wheels) Nurse Communication: Mobility status  Activity Tolerance: Patient tolerated treatment well;Patient limited by pain Patient left: in bed;with call bell/phone within reach  OT Visit Diagnosis: Unsteadiness on feet (R26.81);Pain Pain - part of body:  (BACK)                Time: 0822-0908 OT Time Calculation (min): 46 min Charges:  OT General Charges $OT Visit: 1 Visit OT Evaluation $OT Eval Moderate Complexity: 1 Mod OT Treatments $Self Care/Home Management : 8-22 mins $Therapeutic Activity: 8-22 mins  12/23/2022  AB, OTR/L  Acute Rehabilitation Services  Office: [phone]   Tristan Schroeder 12/23/2022, 9:25 AM

## 2022-12-23 NOTE — [place] FL2 (Signed)
[place] MEDICAID FL2 LEVEL OF CARE FORM     IDENTIFICATION  Patient Name: Pamela [name] Birthdate: Aug 05, 1946 Sex: female Admission Date (Current Location): 12/22/2022  [facility] and [place] Number:  [employer] and Address:  [facility] [place], [address], [place], [place] [address]      Provider Number: [id number]  Attending Physician Name and Address:  Barnett Abu, MD  Relative Name and Phone Number:       Current Level of Care: Hospital Recommended Level of Care: Skilled Nursing Facility Prior Approval Number:    Date Approved/Denied:   PASRR Number: [id number]  Discharge Plan: SNF    Current Diagnoses: Patient Active Problem List   Diagnosis Date Noted   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/22/2022   Compression fracture of L1 lumbar vertebra 12/17/2022   Stage 3b chronic kidney disease 12/17/2022   Subclinical hypothyroidism 12/14/2022   Encounter for hepatitis C screening test for low risk patient 12/14/2022   Pain in joint of right hip 08/11/2022   Pain in left foot 12/17/2021   Closed fracture of fifth metatarsal bone of left foot 12/06/2021   Joint pain 10/24/2021   Muscle pain 10/24/2021   Hammer toe 08/23/2021   Bunion 08/23/2021   Lumbar radiculopathy 08/13/2021   Spinal stenosis of lumbar region 07/19/2021   Cervical radiculopathy 07/19/2021   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 06/27/2021   Acquired thrombophilia 06/27/2021   Osteopenia 06/27/2021   Mixed hyperlipidemia 06/27/2021   Type 2 diabetes mellitus with diabetic neuropathy, with long-term current use of insulin 06/15/2021   Closed fracture of fourth metatarsal bone 05/07/2021   Chronic diastolic congestive heart failure 12/09/2020   Type 2 diabetes mellitus with stage 3 chronic kidney disease 12/09/2020   Asthma    Chronic constipation    DJD (degenerative joint disease)    GERD  (gastroesophageal reflux disease)    Heart murmur    Sleep apnea    Solitary pulmonary nodule on lung CT 02/06/2019   Spondylolisthesis, lumbar region 08/09/2018   Osteoarthritis of right knee 07/06/2018   Atrial fibrillation [I48.91] 10/01/2014   Protein S deficiency (HCC) 03/03/2013   DOE (dyspnea on exertion) 03/03/2013   Ventricular premature beats 05/23/2011   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, NYHA class 1 05/23/2011    Orientation RESPIRATION BLADDER Height & Weight     Self, Time, Situation, Place  Normal Continent Weight: 152 lb (68.9 kg) Height:  5' (152.4 cm)  BEHAVIORAL SYMPTOMS/MOOD NEUROLOGICAL BOWEL NUTRITION STATUS      Continent Diet (carb modified)  AMBULATORY STATUS COMMUNICATION OF NEEDS Skin   Limited Assist Verbally Surgical wounds (closed back wound, liquid skin adhesive)                       Personal Care Assistance Level of Assistance  Bathing, Dressing Bathing Assistance: Limited assistance   Dressing Assistance: Limited assistance     Functional Limitations Info  Sight, Hearing Sight Info: Impaired (glasses) Hearing Info: Impaired (hearing aids)      SPECIAL CARE FACTORS FREQUENCY  PT (By licensed PT), OT (By licensed OT)     PT Frequency: 5x/wk OT Frequency: 5x/wk            Contractures Contractures Info: Not present    Additional Factors Info  Code Status, Allergies, Insulin Sliding Scale Code Status Info: Full Allergies Info: Ketek (Telithromycin), Loxapine Succinate, Naproxen  Sodium, Amoxapine And Related, Darvon, Belsomra (Suvorexant), Cymbalta (Duloxetine Hcl), Duloxetine, Gabapentin, Lyrica (Pregabalin), Ozempic (0.25 Or 0.5 Mg-dose) (Semaglutide(0.25 Or 0.5mg -dos)), Semaglutide, Amoxicillin, Propoxyphene   Insulin Sliding Scale Info: see DC summary       Current Medications (12/23/2022):  This is the current hospital active medication list Current Facility-Administered Medications  Medication Dose  Route Frequency Provider Last Rate Last Admin   0.9 %  sodium chloride infusion  250 mL Intravenous Continuous Barnett Abu, MD 1 mL/hr at 12/22/22 1331 250 mL at 12/22/22 1331   acetaminophen (TYLENOL) tablet 500-1,000 mg  500-1,000 mg Oral Q6H PRN Barnett Abu, MD   1,000 mg at 12/23/22 1010   alum & mag hydroxide-simeth (MAALOX/MYLANTA) 200-200-20 MG/5ML suspension 30 mL  30 mL Oral Q6H PRN Barnett Abu, MD       amLODipine (NORVASC) tablet 5 mg  5 mg Oral Daily Barnett Abu, MD   5 mg at 12/23/22 1011   atorvastatin (LIPITOR) tablet 80 mg  80 mg Oral Daily Barnett Abu, MD   80 mg at 12/23/22 1010   bisacodyl (DULCOLAX) suppository 10 mg  10 mg Rectal Daily PRN Barnett Abu, MD       dapagliflozin propanediol (FARXIGA) tablet 10 mg  10 mg Oral QAC breakfast Barnett Abu, MD   10 mg at 12/23/22 0601   docusate sodium (COLACE) capsule 100 mg  100 mg Oral BID Barnett Abu, MD   100 mg at 12/23/22 1010   enoxaparin (LOVENOX) injection 40 mg  40 mg Subcutaneous Q12H Barnett Abu, MD   40 mg at 12/23/22 1011   furosemide (LASIX) tablet 40 mg  40 mg Oral Daily Barnett Abu, MD   40 mg at 12/23/22 1010   insulin aspart (novoLOG) injection 0-20 Units  0-20 Units Subcutaneous TID WC Barnett Abu, MD   5 Units at 12/23/22 8119   insulin glargine-yfgn (SEMGLEE) injection 8 Units  8 Units Subcutaneous QHS Barnett Abu, MD   6 Units at 12/22/22 2147   ipratropium-albuterol (DUONEB) 0.5-2.5 (3) MG/3ML nebulizer solution 3 mL  3 mL Nebulization QID PRN Barnett Abu, MD       isosorbide mononitrate (IMDUR) 24 hr tablet 30 mg  30 mg Oral Daily Barnett Abu, MD   30 mg at 12/23/22 1011   levothyroxine (SYNTHROID) tablet 25 mcg  25 mcg Oral QAC breakfast Barnett Abu, MD   25 mcg at 12/23/22 0601   lisinopril (ZESTRIL) tablet 40 mg  40 mg Oral BID Barnett Abu, MD   40 mg at 12/23/22 1010   menthol-cetylpyridinium (CEPACOL) lozenge 3 mg  1 lozenge Oral PRN Barnett Abu, MD       Or   phenol  (CHLORASEPTIC) mouth spray 1 spray  1 spray Mouth/Throat PRN Barnett Abu, MD       methocarbamol (ROBAXIN) tablet 500 mg  500 mg Oral Q6H PRN Barnett Abu, MD       Or   methocarbamol (ROBAXIN) 500 mg in dextrose 5 % 50 mL IVPB  500 mg Intravenous Q6H PRN Barnett Abu, MD       mirabegron ER (MYRBETRIQ) tablet 25 mg  25 mg Oral Daily Barnett Abu, MD   25 mg at 12/23/22 1011   morphine (PF) 2 MG/ML injection 2 mg  2 mg Intravenous Q2H PRN Barnett Abu, MD       ondansetron (ZOFRAN) tablet 4 mg  4 mg Oral Q6H PRN Barnett Abu, MD       Or   ondansetron [facility])  injection 4 mg  4 mg Intravenous Q6H PRN Barnett Abu, MD       oxyCODONE-acetaminophen (PERCOCET/ROXICET) 5-325 MG per tablet 1-2 tablet  1-2 tablet Oral Q6H PRN Barnett Abu, MD   1 tablet at 12/22/22 1139   pantoprazole (PROTONIX) EC tablet 40 mg  40 mg Oral Daily Barnett Abu, MD   40 mg at 12/23/22 1010   polyethylene glycol (MIRALAX / GLYCOLAX) packet 17 g  17 g Oral Daily PRN Barnett Abu, MD       senna (SENOKOT) tablet 8.6 mg  1 tablet Oral BID Barnett Abu, MD   8.6 mg at 12/23/22 1010   simethicone (MYLICON) chewable tablet 80 mg  80 mg Oral Q6H PRN Barnett Abu, MD   80 mg at 12/22/22 2231   sodium chloride (OCEAN) 0.65 % nasal spray 1 spray  1 spray Each Nare PRN Barnett Abu, MD       sodium chloride flush (NS) 0.9 % injection 3 mL  3 mL Intravenous Q12H Barnett Abu, MD   3 mL at 12/23/22 1013   sodium chloride flush (NS) 0.9 % injection 3 mL  3 mL Intravenous PRN Barnett Abu, MD       sodium phosphate (FLEET) 7-19 GM/118ML enema 1 enema  1 enema Rectal Once PRN Barnett Abu, MD       traMADol Janean Sark) tablet 50 mg  50 mg Oral QHS PRN Barnett Abu, MD   50 mg at 12/23/22 0601   traZODone (DESYREL) tablet 50 mg  50 mg Oral Claris Gladden, MD   50 mg at 12/22/22 2106     Discharge Medications: Please see discharge summary for a list of discharge medications.  Relevant Imaging Results:  Relevant Lab  Results:   Additional Information SS#: [id number]  Baldemar Lenis, LCSW

## 2022-12-23 NOTE — Progress Notes (Signed)
Report called to RN on 5N. Pt transferred to 5N10 per MD order. Husband at the bedside at the time of transfer. All belongings were sent with the Pt. Rema Fendt, RN

## 2022-12-23 NOTE — Progress Notes (Signed)
Patient ID: Pamela [name], female   DOB: 1946-03-24, 77 y.o.   MRN: [id number] Signs are stable Patient is feeling better She still feels rather shaky on her feet but she is walking well I noted that she will likely be too good for comprehensive inpatient rehabilitation and could otherwise go home however the patient is involved in the care of her 68 year old husband who is quite disabled and at this point she needs some care of her own.  Her daughter does not drive secondary to dystonia and cannot be there to facilitate her care.  She will likely need skilled nursing facility for short-term stay until she is more independent.  Will see what we can do to get this process going.

## 2022-12-23 NOTE — TOC Initial Note (Signed)
Transition of Care [facility]) - Initial/Assessment Note    Patient Details  Name: Pamela [name] MRN: [id number] Date of Birth: 1946/01/10  Transition of Care [facility]) CM/SW Contact:    Baldemar Lenis, LCSW Phone Number: 12/23/2022, 10:46 AM  Clinical Narrative:        Patient from home with spouse, agreeable to recommendation for SNF, preferably at [facility] in [place]. CSW completed referral, sent to [facility] for review, awaiting response. CSW to follow.           Expected Discharge Plan: Skilled Nursing Facility Barriers to Discharge: Continued Medical Work up, English as a second language teacher   Patient Goals and CMS Choice Patient states their goals for this hospitalization and ongoing recovery are:: to get rehab, preferably at [facility].gov Compare Post Acute Care list provided to:: Patient Choice offered to / list presented to : Patient [place] ownership interest in [facility].provided to:: Patient    Expected Discharge Plan and Services     Post Acute Care Choice: Skilled Nursing Facility Living arrangements for the past 2 months: Single Family Home                                      Prior Living Arrangements/Services Living arrangements for the past 2 months: Single Family Home Lives with:: Spouse Patient language and need for interpreter reviewed:: No Do you feel safe going back to the place where you live?: Yes      Need for Family Participation in Patient Care: No (Comment) Care giver support system in place?: No (comment)   Criminal Activity/Legal Involvement Pertinent to Current Situation/Hospitalization: No - Comment as needed  Activities of Daily Living Home Assistive Devices/Equipment: CBG Meter, Wheelchair, Environmental consultant (specify type), Blood pressure cuff, Scales, Grab bars around toilet ADL Screening (condition at time of admission) Patient's cognitive ability adequate to safely complete daily activities?: Yes Is the patient  deaf or have difficulty hearing?: Yes (Bilateral Hearing Aids) Does the patient have difficulty seeing, even when wearing glasses/contacts?: Yes Does the patient have difficulty concentrating, remembering, or making decisions?: Yes (Only when tired or in pain) Patient able to express need for assistance with ADLs?: Yes Does the patient have difficulty dressing or bathing?: No Independently performs ADLs?: Yes (appropriate for developmental age) Does the patient have difficulty walking or climbing stairs?: Yes Weakness of Legs: Both Weakness of Arms/Hands: None  Permission Sought/Granted Permission sought to share information with : Facility Medical sales representative, Family Supports Permission granted to share information with : Yes, Verbal Permission Granted  Share Information with NAME: Jomarie Longs  Permission granted to share info w AGENCY: SNF  Permission granted to share info w Relationship: Spouse     Emotional Assessment Appearance:: Appears stated age Attitude/Demeanor/Rapport: Engaged Affect (typically observed): Appropriate Orientation: : Oriented to Self, Oriented to Place, Oriented to  Time, Oriented to Situation Alcohol / Substance Use: Not Applicable Psych Involvement: No (comment)  Admission diagnosis:  Spinal stenosis of lumbar region with neurogenic claudication [M48.062] Patient Active Problem List   Diagnosis Date Noted   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/22/2022   Compression fracture of L1 lumbar vertebra 12/17/2022   Stage 3b chronic kidney disease 12/17/2022   Subclinical hypothyroidism 12/14/2022   Encounter for hepatitis C screening test for low risk patient 12/14/2022   Pain in joint of right hip 08/11/2022   Pain in left foot 12/17/2021  Closed fracture of fifth metatarsal bone of left foot 12/06/2021   Joint pain 10/24/2021   Muscle pain 10/24/2021   Hammer toe 08/23/2021   Bunion 08/23/2021   Lumbar radiculopathy 08/13/2021    Spinal stenosis of lumbar region 07/19/2021   Cervical radiculopathy 07/19/2021   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 06/27/2021   Acquired thrombophilia 06/27/2021   Osteopenia 06/27/2021   Mixed hyperlipidemia 06/27/2021   Type 2 diabetes mellitus with diabetic neuropathy, with long-term current use of insulin 06/15/2021   Closed fracture of fourth metatarsal bone 05/07/2021   Chronic diastolic congestive heart failure 12/09/2020   Type 2 diabetes mellitus with stage 3 chronic kidney disease 12/09/2020   Asthma    Chronic constipation    DJD (degenerative joint disease)    GERD (gastroesophageal reflux disease)    Heart murmur    Sleep apnea    Solitary pulmonary nodule on lung CT 02/06/2019   Spondylolisthesis, lumbar region 08/09/2018   Osteoarthritis of right knee 07/06/2018   Atrial fibrillation [I48.91] 10/01/2014   Protein S deficiency (HCC) 03/03/2013   DOE (dyspnea on exertion) 03/03/2013   Ventricular premature beats 05/23/2011   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 [place] [address] Phone: [phone] Fax: [phone]  [facility] - [place], [place] - [address] [facility] [address] [place] [address] Phone: [phone] Fax: [phone]  [id number] - [place], [place] - [address] AT [facility] OF [address] [place] [address] Phone: [phone] Fax: [phone]  [facility] - [facility] [address] [place] [address] Phone: [phone] Fax: [phone]     Social Determinants of Health (SDOH) Social History: SDOH Screenings   Food Insecurity: No Food Insecurity (12/10/2022)   Housing: Low Risk  (12/10/2022)  Transportation Needs: No Transportation Needs (12/10/2022)  Depression (PHQ2-9): Low Risk  (12/14/2022)  Financial Resource Strain: Low Risk  (12/10/2022)  Physical Activity: Inactive (12/10/2022)  Social Connections: Unknown (12/10/2022)  Stress: No Stress Concern Present (12/10/2022)  Tobacco Use: Low Risk  (12/23/2022)   SDOH Interventions:     Readmission Risk Interventions     No data to display

## 2022-12-24 DIAGNOSIS — Z86718 Personal history of other venous thrombosis and embolism: Secondary | ICD-10-CM | POA: Diagnosis not present

## 2022-12-24 DIAGNOSIS — I480 Paroxysmal atrial fibrillation: Secondary | ICD-10-CM | POA: Diagnosis not present

## 2022-12-24 DIAGNOSIS — Z8673 Personal history of transient ischemic attack (TIA), and cerebral infarction without residual deficits: Secondary | ICD-10-CM | POA: Diagnosis not present

## 2022-12-24 DIAGNOSIS — M48062 Spinal stenosis, lumbar region with neurogenic claudication: Secondary | ICD-10-CM | POA: Diagnosis not present

## 2022-12-24 DIAGNOSIS — E1122 Type 2 diabetes mellitus with diabetic chronic kidney disease: Secondary | ICD-10-CM | POA: Diagnosis not present

## 2022-12-24 DIAGNOSIS — N183 Chronic kidney disease, stage 3 unspecified: Secondary | ICD-10-CM | POA: Diagnosis not present

## 2022-12-24 DIAGNOSIS — J45909 Unspecified asthma, uncomplicated: Secondary | ICD-10-CM | POA: Diagnosis not present

## 2022-12-24 DIAGNOSIS — M5416 Radiculopathy, lumbar region: Secondary | ICD-10-CM | POA: Diagnosis not present

## 2022-12-24 DIAGNOSIS — E039 Hypothyroidism, unspecified: Secondary | ICD-10-CM | POA: Diagnosis not present

## 2022-12-24 LAB — GLUCOSE, CAPILLARY
Glucose-Capillary: 144 mg/dL — ABNORMAL HIGH (ref 70–99)
Glucose-Capillary: 133 mg/dL — ABNORMAL HIGH (ref 70–99)
Glucose-Capillary: 211 mg/dL — ABNORMAL HIGH (ref 70–99)
Glucose-Capillary: 177 mg/dL — ABNORMAL HIGH (ref 70–99)

## 2022-12-24 MED ORDER — WARFARIN SODIUM 5 MG PO TABS
10.0000 mg | ORAL_TABLET | ORAL | Status: AC
  Administered 2022-12-24: 10 mg via ORAL
  Filled 2022-12-24: qty 2

## 2022-12-24 MED ORDER — INSULIN GLARGINE (1 UNIT DIAL) 300 UNIT/ML ~~LOC~~ SOPN
6.0000 [IU] | PEN_INJECTOR | Freq: Every day | SUBCUTANEOUS | Status: DC
  Administered 2022-12-24 – 2022-12-26 (×3): 6 [IU] via SUBCUTANEOUS
  Filled 2022-12-24 (×6): qty 1.5
  Filled 2022-12-24: qty 1

## 2022-12-24 MED ORDER — WARFARIN SODIUM 6 MG PO TABS
6.0000 mg | ORAL_TABLET | Freq: Every day | ORAL | Status: DC
  Administered 2022-12-25 – 2022-12-26 (×2): 6 mg via ORAL
  Filled 2022-12-24 (×2): qty 3

## 2022-12-24 MED ORDER — WARFARIN - PHYSICIAN DOSING INPATIENT: Freq: Every day | Status: DC

## 2022-12-24 NOTE — Progress Notes (Signed)
Patient and daughter concerned about patient's diet due to multiple health issues and food restrictions, asking for dietician consult.

## 2022-12-24 NOTE — Progress Notes (Signed)
Physical Therapy Treatment Patient Details Name: Pamela [name] MRN: [id number] DOB: Feb 12, 1946 Today's Date: 12/24/2022   History of Present Illness 77 y.o. female admitted on 12/22/2022 with c/o lower extremity pain and weakeness. She is s/p Laminectomy L2-3 and L3-4 with decompression. PMH of MD, GER, HTN, OA, Osteoporosis, CKD, DDD, Hand surgery, ORIF L ankle, TKA    PT Comments    Pt greeted up in chair on arrival and agreeable to session. Pt limited by pain and fatigue this session, able to demonstrate gait in hall with RW support and min guard for safety with cues for technique and safety. PT needing x1 seated rest break secondary to fatigue. Pt with good adherence to all precautions with light cues during mobility. Continued educated on continued walker use versus rollator to maximize functional independence, safety, and decrease risk for falls as well as frequent mobilization and importance of time up OOB with pt verbalizing understanding. Pt continues to benefit from skilled PT services to progress toward functional mobility goals.    Recommendations for follow up therapy are one component of a multi-disciplinary discharge planning process, led by the attending physician.  Recommendations may be updated based on patient status, additional functional criteria and insurance authorization.  Follow Up Recommendations  Can patient physically be transported by private vehicle: Yes    Assistance Recommended at Discharge Frequent or constant Supervision/Assistance  Patient can return home with the following A little help with walking and/or transfers;A little help with bathing/dressing/bathroom;Assistance with cooking/housework;Assist for transportation;Help with stairs or ramp for entrance   Equipment Recommendations  None recommended by PT    Recommendations for Other Services       Precautions / Restrictions Precautions Precautions: Back;Fall Precaution Booklet Issued:  Yes (comment) Precaution Comments: Reviewed handout and pt was cued for precautions during functional mobility. Restrictions Weight Bearing Restrictions: No     Mobility  Bed Mobility Overal bed mobility: Needs Assistance             General bed mobility comments: pt up in chair on arrival    Transfers Overall transfer level: Needs assistance Equipment used: Rolling walker (2 wheels) Transfers: Sit to/from Stand Sit to Stand: Min guard           General transfer comment: Hands on guarding for safety as pt powered up to full stand    Ambulation/Gait Ambulation/Gait assistance: Min guard Gait Distance (Feet): 50 Feet (+ 90') Assistive device: Rolling walker (2 wheels) Gait Pattern/deviations: Step-through pattern, Decreased stride length, Trunk flexed Gait velocity: Decreased     General Gait Details: Slow and guarded due to pain. No assist required. VC's for improved posture, closer walker proximity and forward gaze. distance limited by fatigue, x1 seated rest   Stairs             Wheelchair Mobility    Modified Rankin (Stroke Patients Only)       Balance Overall balance assessment: Needs assistance Sitting-balance support: Feet supported Sitting balance-Leahy Scale: Fair     Standing balance support: During functional activity, Reliant on assistive device for balance, Bilateral upper extremity supported Standing balance-Leahy Scale: Poor Standing balance comment: RW                            Cognition Arousal/Alertness: Awake/alert Behavior During Therapy: WFL for tasks assessed/performed Overall Cognitive Status: Within Functional Limits for tasks assessed  Exercises      General Comments General comments (skin integrity, edema, etc.): VSS on RA      Pertinent Vitals/Pain Pain Assessment Pain Assessment: Faces Faces Pain Scale: Hurts little more Pain Location:  abdomen and back Pain Descriptors / Indicators: Aching, Operative site guarding, Tightness (bloated) Pain Intervention(s): Monitored during session, Limited activity within patient's tolerance    Home Living                          Prior Function            PT Goals (current goals can now be found in the care plan section) Acute Rehab PT Goals Patient Stated Goal: Be able to return home PT Goal Formulation: With patient/family Time For Goal Achievement: 12/30/22 Progress towards PT goals: Progressing toward goals    Frequency    Min 5X/week      PT Plan      Co-evaluation              AM-PAC PT "6 Clicks" Mobility   Outcome Measure  Help needed turning from your back to your side while in a flat bed without using bedrails?: A Little Help needed moving from lying on your back to sitting on the side of a flat bed without using bedrails?: A Little Help needed moving to and from a bed to a chair (including a wheelchair)?: A Little Help needed standing up from a chair using your arms (e.g., wheelchair or bedside chair)?: A Little Help needed to walk in hospital room?: A Little Help needed climbing 3-5 steps with a railing? : A Little 6 Click Score: 18    End of Session   Activity Tolerance: Patient limited by pain Patient left: with call bell/phone within reach;with family/visitor present;in chair Nurse Communication: Mobility status PT Visit Diagnosis: Unsteadiness on feet (R26.81);Pain Pain - part of body:  (back)     Time: 1610-9604 PT Time Calculation (min) (ACUTE ONLY): 23 min  Charges:  $Gait Training: 23-37 mins                     Kiasia Chou R. PTA Acute Rehabilitation Services Office: [phone]    Catalina Antigua 12/24/2022, 2:24 PM

## 2022-12-24 NOTE — Progress Notes (Signed)
Postop day 2.  No new issues or problems overnight.  Patient's back pain well-controlled.  No lower extremity pain numbness or weakness.  Patient complains about bruising from the Lovenox shots.  Awake and alert.  Oriented and appropriate.  Motor and sensory function intact.  Wound dressing clean and dry.  Overall progressing well.  Continue efforts at mobilization.  Begin Coumadin restart.  Continue Lovenox.

## 2022-12-25 DIAGNOSIS — E1122 Type 2 diabetes mellitus with diabetic chronic kidney disease: Secondary | ICD-10-CM | POA: Diagnosis not present

## 2022-12-25 DIAGNOSIS — E039 Hypothyroidism, unspecified: Secondary | ICD-10-CM | POA: Diagnosis not present

## 2022-12-25 DIAGNOSIS — I480 Paroxysmal atrial fibrillation: Secondary | ICD-10-CM | POA: Diagnosis not present

## 2022-12-25 DIAGNOSIS — M5416 Radiculopathy, lumbar region: Secondary | ICD-10-CM | POA: Diagnosis not present

## 2022-12-25 DIAGNOSIS — Z8673 Personal history of transient ischemic attack (TIA), and cerebral infarction without residual deficits: Secondary | ICD-10-CM | POA: Diagnosis not present

## 2022-12-25 DIAGNOSIS — J45909 Unspecified asthma, uncomplicated: Secondary | ICD-10-CM | POA: Diagnosis not present

## 2022-12-25 DIAGNOSIS — M48062 Spinal stenosis, lumbar region with neurogenic claudication: Secondary | ICD-10-CM | POA: Diagnosis not present

## 2022-12-25 DIAGNOSIS — N183 Chronic kidney disease, stage 3 unspecified: Secondary | ICD-10-CM | POA: Diagnosis not present

## 2022-12-25 DIAGNOSIS — Z86718 Personal history of other venous thrombosis and embolism: Secondary | ICD-10-CM | POA: Diagnosis not present

## 2022-12-25 LAB — GLUCOSE, CAPILLARY
Glucose-Capillary: 132 mg/dL — ABNORMAL HIGH (ref 70–99)
Glucose-Capillary: 204 mg/dL — ABNORMAL HIGH (ref 70–99)
Glucose-Capillary: 232 mg/dL — ABNORMAL HIGH (ref 70–99)
Glucose-Capillary: 205 mg/dL — ABNORMAL HIGH (ref 70–99)

## 2022-12-25 LAB — PROTIME-INR
INR: 1.3 — ABNORMAL HIGH (ref 0.8–1.2)
Prothrombin Time: 16.3 seconds — ABNORMAL HIGH (ref 11.4–15.2)

## 2022-12-25 MED ORDER — GLUCERNA SHAKE PO LIQD
237.0000 mL | ORAL | Status: DC
  Administered 2022-12-25: 237 mL via ORAL

## 2022-12-25 MED ORDER — ADULT MULTIVITAMIN W/MINERALS CH
1.0000 | ORAL_TABLET | Freq: Every day | ORAL | Status: DC
  Administered 2022-12-25 – 2022-12-27 (×3): 1 via ORAL
  Filled 2022-12-25 (×3): qty 1

## 2022-12-25 MED ORDER — ENSURE ENLIVE PO LIQD
237.0000 mL | ORAL | Status: DC
  Administered 2022-12-25 – 2022-12-26 (×2): 237 mL via ORAL

## 2022-12-25 NOTE — Discharge Instructions (Signed)

## 2022-12-25 NOTE — Progress Notes (Signed)
Mobility Specialist Progress Note   12/25/22 1100  Mobility  Activity Ambulated with assistance in hallway  Level of Assistance Contact guard assist, steadying assist  Assistive Device Front wheel walker  Distance Ambulated (ft) 80 ft  Range of Motion/Exercises Active;All extremities  Activity Response Tolerated well   Patient received in supine and agreeable to participate. Completes bed mobility independently but requires cues for proper technique to log-roll. Stood and ambulated min guard with slow steady gait. Needed seated rest break x1 secondary to fatigue. Returned to room without complaint or incident. Assisted patient from sit>supine with min A + cues. Was left in with all needs met, call bell in reach.   Pamela [name], BS EXP Mobility Specialist Please contact via SecureChat or Rehab office at [phone]

## 2022-12-25 NOTE — Progress Notes (Signed)
Initial Nutrition Assessment RD working remotely.   DOCUMENTATION CODES:   Not applicable  INTERVENTION:  - ordered Ensure Plus High Protein once/day, each supplement provides 350 kcal and 20 grams of protein.  - ordered Glucerna Shake once/day, each supplement provides 220 kcal and 10 grams of protein  - ordered 1 tablet multivitamin with minerals/day.  - complete NFPE when feasible.  - added Carbohydrate Counting for People with Diabetes handout from the Academy of Nutrition and Dietetics to AVS.   NUTRITION DIAGNOSIS:   Increased nutrient needs related to post-op healing as evidenced by estimated needs.  GOAL:   Patient will meet greater than or equal to 90% of their needs  MONITOR:   PO intake, Supplement acceptance, Labs, Weight trends  REASON FOR ASSESSMENT:   Consult Assessment of nutrition requirement/status, Diet education  ASSESSMENT:   77 y.o. female with medical history of HTN, GERD, HLD, type 2 DM, protein S deficiency, TIA, afib, osteoarthritis, asthma, iron deficiency anemia, chronic constipation, bilateral leg cramps, CKD stage 3, and DDD. She presented to the ED due to lower extremity pain and weakness. 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 Patient has been advised of the need for surgical decompression.  She ate 100% of breakfast on 4/19 and 75% of breakfast on 4/20. No other meal completion percentages documented during this hospitalization. She has not been assessed by a [place] RD at any time in the past.   Weight on 4/18 was 152 lb and weight has been stable since 09/07/22 when she weighed 155 lb. Weight on 08/09/22 was 159 lb which indicates 7 lb / 4.4% weight loss in 4 months; not significant for time frame. No documentation in the edema section of flow sheet.  Per notes: - plan for SNF once bed is available - patient underwent laminectomy L2-3 and L3-4 with decompression of the central canal  and lateral recesses on 4/18   Labs reviewed; CBGs: 132 and 204 mg/dl.  Medications reviewed; 100 mg colace BID, 40 mg oral lasix/day, sliding scale novolog, 25 mcg oral synthroid/day, 40 mg oral protoni/day, 1 tablet senokot BID.    NUTRITION - FOCUSED PHYSICAL EXAM:  RD working remotely.  Diet Order:   Diet Order             Diet Carb Modified Fluid consistency: Thin; Room service appropriate? Yes  Diet effective now                   EDUCATION NEEDS:   No education needs have been identified at this time  Skin:  Skin Assessment: Skin Integrity Issues: Skin Integrity Issues:: Incisions Incisions: back (12/22/22), closed  Last BM:  PTA/unknown  Height:   Ht Readings from Last 1 Encounters:  12/22/22 5' (1.524 m)    Weight:   Wt Readings from Last 1 Encounters:  12/22/22 68.9 kg    BMI:  Body mass index is 29.69 kg/m.  Estimated Nutritional Needs:  Kcal:  1600-1800 kcal Protein:  75-90 grams Fluid:  >/= 1.7 L/day     Trenton Gammon, MS, RD, LDN, CNSC Clinical Dietitian PRN/Relief staff On-call/weekend pager # available in [facility]

## 2022-12-25 NOTE — Progress Notes (Signed)
   Providing Compassionate, Quality Care - Together   Subjective: Patient reports no new issues. Coumadin restarted yesterday.  Objective: Vital signs in last 24 hours: Temp:  [97.6 F (36.4 C)-98 F (36.7 C)] 97.6 F (36.4 C) (04/21 0813) Pulse Rate:  [42-85] 44 (04/21 0813) Resp:  [16-18] 17 (04/21 0813) BP: (118-155)/(31-63) 155/56 (04/21 0813) SpO2:  [96 %-99 %] 98 % (04/21 0813)  Intake/Output from previous day: 04/20 0701 - 04/21 0700 In: 250.6 [P.O.:240; I.V.:10.6] Out: -  Intake/Output this shift: No intake/output data recorded.  Alert and oriented x 4, sitting upright in chair PERRLA CN II-XII grossly intact MAE, Strength and sensation intact Incision is clean, dry, and intact   Lab Results: No results for input(s): "WBC", "HGB", "HCT", "PLT" in the last 72 hours. BMET No results for input(s): "NA", "K", "CL", "CO2", "GLUCOSE", "BUN", "CREATININE", "CALCIUM" in the last 72 hours.  Studies/Results: No results found.  Assessment/Plan: Patient underwent L2-3 and L3-4 laminectomies with decompression of the central canal and lateral recesses on 12/22/2022 by Dr. Danielle Dess.   LOS: 0 days   -Plan is for discharge to SNF for further rehabilitation once bed is available. -Continue to mobilize.   Val Eagle, DNP, AGNP-C Nurse Practitioner  [facility] [address], [place], [place] [address] P: [phone]    F: [phone]  12/25/2022, 11:25 AM

## 2022-12-26 DIAGNOSIS — Z86718 Personal history of other venous thrombosis and embolism: Secondary | ICD-10-CM | POA: Diagnosis not present

## 2022-12-26 DIAGNOSIS — M48062 Spinal stenosis, lumbar region with neurogenic claudication: Secondary | ICD-10-CM | POA: Diagnosis not present

## 2022-12-26 DIAGNOSIS — E039 Hypothyroidism, unspecified: Secondary | ICD-10-CM | POA: Diagnosis not present

## 2022-12-26 DIAGNOSIS — N183 Chronic kidney disease, stage 3 unspecified: Secondary | ICD-10-CM | POA: Diagnosis not present

## 2022-12-26 DIAGNOSIS — Z8673 Personal history of transient ischemic attack (TIA), and cerebral infarction without residual deficits: Secondary | ICD-10-CM | POA: Diagnosis not present

## 2022-12-26 DIAGNOSIS — I480 Paroxysmal atrial fibrillation: Secondary | ICD-10-CM | POA: Diagnosis not present

## 2022-12-26 DIAGNOSIS — M5416 Radiculopathy, lumbar region: Secondary | ICD-10-CM | POA: Diagnosis not present

## 2022-12-26 DIAGNOSIS — J45909 Unspecified asthma, uncomplicated: Secondary | ICD-10-CM | POA: Diagnosis not present

## 2022-12-26 DIAGNOSIS — E1122 Type 2 diabetes mellitus with diabetic chronic kidney disease: Secondary | ICD-10-CM | POA: Diagnosis not present

## 2022-12-26 LAB — CBC
HCT: 32.7 % — ABNORMAL LOW (ref 36.0–46.0)
Hemoglobin: 10.7 g/dL — ABNORMAL LOW (ref 12.0–15.0)
MCH: 30.4 pg (ref 26.0–34.0)
MCHC: 32.7 g/dL (ref 30.0–36.0)
MCV: 92.9 fL (ref 80.0–100.0)
Platelets: 193 10*3/uL (ref 150–400)
RBC: 3.52 MIL/uL — ABNORMAL LOW (ref 3.87–5.11)
RDW: 13.5 % (ref 11.5–15.5)
WBC: 8 10*3/uL (ref 4.0–10.5)
nRBC: 0 % (ref 0.0–0.2)

## 2022-12-26 LAB — PROTIME-INR
INR: 1.4 — ABNORMAL HIGH (ref 0.8–1.2)
Prothrombin Time: 16.6 seconds — ABNORMAL HIGH (ref 11.4–15.2)

## 2022-12-26 LAB — GLUCOSE, CAPILLARY
Glucose-Capillary: 194 mg/dL — ABNORMAL HIGH (ref 70–99)
Glucose-Capillary: 236 mg/dL — ABNORMAL HIGH (ref 70–99)
Glucose-Capillary: 251 mg/dL — ABNORMAL HIGH (ref 70–99)
Glucose-Capillary: 220 mg/dL — ABNORMAL HIGH (ref 70–99)
Glucose-Capillary: 196 mg/dL — ABNORMAL HIGH (ref 70–99)

## 2022-12-26 MED ORDER — MAGNESIUM HYDROXIDE 400 MG/5ML PO SUSP
30.0000 mL | Freq: Every day | ORAL | Status: DC | PRN
  Administered 2022-12-26: 30 mL via ORAL
  Filled 2022-12-26 (×2): qty 30

## 2022-12-26 MED ORDER — WARFARIN - PHARMACIST DOSING INPATIENT: Freq: Every day | Status: DC

## 2022-12-26 NOTE — Progress Notes (Signed)
Physical Therapy Treatment Patient Details Name: Pamela [name] MRN: [id number] DOB: [birth year] Today's Date: 12/26/2022   History of Present Illness 77 y.o. female admitted on 12/22/2022 with c/o lower extremity pain and weakeness. She is s/p Laminectomy L2-3 and L3-4 with decompression. PMH of MD, GER, HTN, OA, Osteoporosis, CKD, DDD, Hand surgery, ORIF L ankle, TKA    PT Comments    Continues to make gradual progress. Min assist for bed mobility to safely maintain back precautions (husband unable to assist at home.) Min guard with gait training, >400 feet with RW for support, progressed without AD however shows increased instability and reported back pain, narrow BOS and near LOB. Still has not had BM today, feels bloated. Family present in room. Patient will continue to benefit from skilled physical therapy services to further improve independence with functional mobility.    Recommendations for follow up therapy are one component of a multi-disciplinary discharge planning process, led by the attending physician.  Recommendations may be updated based on patient status, additional functional criteria and insurance authorization.  Follow Up Recommendations  Can patient physically be transported by private vehicle: Yes    Assistance Recommended at Discharge Frequent or constant Supervision/Assistance  Patient can return home with the following A little help with walking and/or transfers;A little help with bathing/dressing/bathroom;Assistance with cooking/housework;Assist for transportation;Help with stairs or ramp for entrance   Equipment Recommendations  None recommended by PT    Recommendations for Other Services       Precautions / Restrictions Precautions Precautions: Back;Fall Precaution Comments: Reviewed precautions Restrictions Weight Bearing Restrictions: No     Mobility  Bed Mobility Overal bed mobility: Needs Assistance Bed Mobility: Rolling, Sidelying  to Sit, Sit to Sidelying Rolling: Min guard Sidelying to sit: Min assist     Sit to sidelying: Min assist General bed mobility comments: Min assist for log roll technique to pres up to EOB and maintain precautions. Min assist for LE support back into bed with cues for technique throughout.    Transfers Overall transfer level: Needs assistance Equipment used: Rolling walker (2 wheels) Transfers: Sit to/from Stand Sit to Stand: Min guard           General transfer comment: Min guard for safety, slow to rise, good recall of hand placement. Performed from low bed setting.    Ambulation/Gait Ambulation/Gait assistance: Min guard Gait Distance (Feet): 425 Feet Assistive device: Rolling walker (2 wheels), None Gait Pattern/deviations: Step-through pattern, Decreased stride length, Trunk flexed, Narrow base of support Gait velocity: Decreased Gait velocity interpretation: <1.31 ft/sec, indicative of household ambulator   General Gait Details: Performed majority of distance with RW, cues for uprigth posture, slow and guarded but motivated. Tried several short distances during bout withou AD up to 10 feet ea with min guard for safety, showing increased instability and sway. Stabilizes much better with UE support on RW.   Stairs             Wheelchair Mobility    Modified Rankin (Stroke Patients Only)       Balance Overall balance assessment: Needs assistance Sitting-balance support: Feet supported Sitting balance-Leahy Scale: Fair     Standing balance support: During functional activity, No upper extremity supported Standing balance-Leahy Scale: Fair                              Cognition Arousal/Alertness: Awake/alert Behavior During Therapy: WFL for tasks assessed/performed Overall Cognitive Status: Within  Functional Limits for tasks assessed                                          Exercises      General Comments         Pertinent Vitals/Pain Pain Assessment Pain Assessment: Faces Faces Pain Scale: Hurts even more Pain Location: back Pain Descriptors / Indicators: Tightness, Discomfort Pain Intervention(s): Monitored during session, Repositioned    Home Living                          Prior Function            PT Goals (current goals can now be found in the care plan section) Acute Rehab PT Goals Patient Stated Goal: Be able to return home PT Goal Formulation: With patient/family Time For Goal Achievement: 12/30/22 Potential to Achieve Goals: Good Progress towards PT goals: Progressing toward goals    Frequency    Min 5X/week      PT Plan Current plan remains appropriate    Co-evaluation              AM-PAC PT "6 Clicks" Mobility   Outcome Measure  Help needed turning from your back to your side while in a flat bed without using bedrails?: A Little Help needed moving from lying on your back to sitting on the side of a flat bed without using bedrails?: A Little Help needed moving to and from a bed to a chair (including a wheelchair)?: A Little Help needed standing up from a chair using your arms (e.g., wheelchair or bedside chair)?: A Little Help needed to walk in hospital room?: A Little Help needed climbing 3-5 steps with a railing? : A Little 6 Click Score: 18    End of Session Equipment Utilized During Treatment: Gait belt Activity Tolerance: Patient tolerated treatment well Patient left: with family/visitor present;in bed;with call bell/phone within reach;with bed alarm set;with SCD's reapplied   PT Visit Diagnosis: Unsteadiness on feet (R26.81);Pain Pain - part of body:  (back)     Time: 1351-1420 PT Time Calculation (min) (ACUTE ONLY): 29 min  Charges:  $Gait Training: 8-22 mins $Therapeutic Activity: 8-22 mins                     Kathlyn Sacramento, PT, DPT Physical Therapist Acute Rehabilitation Services [facility]    Berton Mount 12/26/2022, 7:43 PM

## 2022-12-26 NOTE — Progress Notes (Addendum)
Occupational Therapy Treatment Patient Details Name: Pamela [name] MRN: [id number] DOB: 07-21-1946 Today's Date: 12/26/2022   History of present illness 77 y.o. female admitted on 12/22/2022 with c/o lower extremity pain and weakeness. She is s/p Laminectomy L2-3 and L3-4 with decompression. PMH of MD, GER, HTN, OA, Osteoporosis, CKD, DDD, Hand surgery, ORIF L ankle, TKA   OT comments  Pt making good progress with functional goals. Pt seated in recliner upon arrival with daughter present. Pt stood from recliner to RW to walk to bathroom min guard A, toilet transfer with Sup, clothing mgt and toileting tasks with min guard A standing. Pt stood at toilet to wash and dry hands min guard A. OT initiated LB selfcare education with A/E. Pt eager to d/c to SNF for short term therapy. Per mobility tech pt walking 120 ft with RW. OT will continue to follow acutely to maximize level of function and safety   Recommendations for follow up therapy are one component of a multi-disciplinary discharge planning process, led by the attending physician.  Recommendations may be updated based on patient status, additional functional criteria and insurance authorization.    Assistance Recommended at Discharge Frequent or constant Supervision/Assistance  Patient can return home with the following  A little help with walking and/or transfers;A lot of help with bathing/dressing/bathroom;Help with stairs or ramp for entrance;Assist for transportation;Assistance with cooking/housework   Equipment Recommendations  Other (comment) (TBD at next venue of care)    Recommendations for Other Services      Precautions / Restrictions Precautions Precautions: Back;Fall Precaution Comments: Reviewed handout and pt was cued for precautions during functional mobility. Pt able to recall all back precautions Restrictions Weight Bearing Restrictions: No       Mobility Bed Mobility               General bed  mobility comments: pt up in chair on arrival    Transfers Overall transfer level: Needs assistance Equipment used: Rolling walker (2 wheels) Transfers: Sit to/from Stand Sit to Stand: Min guard, Supervision           General transfer comment: Min guard A initial sit - stand from chair and to walk to bathroom with RW, Sup with toilet transfer using grab bar     Balance Overall balance assessment: Needs assistance Sitting-balance support: Feet supported Sitting balance-Leahy Scale: Fair     Standing balance support: During functional activity, Bilateral upper extremity supported Standing balance-Leahy Scale: Poor                             ADL either performed or assessed with clinical judgement   ADL Overall ADL's : Needs assistance/impaired     Grooming: Wash/dry hands;Wash/dry face;Min guard;Standing       Lower Body Bathing: Moderate assistance;Sitting/lateral leans Lower Body Bathing Details (indicate cue type and reason): simulated     Lower Body Dressing: Moderate assistance   Toilet Transfer: Supervision/safety;Ambulation;Rolling walker (2 wheels);BSC/3in1;Grab bars   Toileting- Clothing Manipulation and Hygiene: Min guard;Sit to/from stand       Functional mobility during ADLs: Min guard;Set up;Rolling walker (2 wheels) General ADL Comments: initiated A/E education for LB selfcare with video demo    Extremity/Trunk Assessment Upper Extremity Assessment Upper Extremity Assessment: Generalized weakness   Lower Extremity Assessment Lower Extremity Assessment: Defer to PT evaluation   Cervical / Trunk Assessment Cervical / Trunk Assessment: Back Surgery    Vision Baseline Vision/History: 1  Wears glasses Ability to See in Adequate Light: 0 Adequate Patient Visual Report: No change from baseline     Perception     Praxis      Cognition   Behavior During Therapy: WFL for tasks assessed/performed Overall Cognitive Status: Within  Functional Limits for tasks assessed                                          Exercises      Shoulder Instructions       General Comments      Pertinent Vitals/ Pain       Pain Assessment Pain Assessment: 0-10 Pain Score: 5  Pain Location: back Pain Descriptors / Indicators: Tightness, Discomfort Pain Intervention(s): Monitored during session, Repositioned  Home Living                                          Prior Functioning/Environment              Frequency  Min 2X/week        Progress Toward Goals  OT Goals(current goals can now be found in the care plan section)  Progress towards OT goals: Progressing toward goals     Plan Discharge plan remains appropriate    Co-evaluation                 AM-PAC OT "6 Clicks" Daily Activity     Outcome Measure   Help from another person eating meals?: None Help from another person taking care of personal grooming?: A Little Help from another person toileting, which includes using toliet, bedpan, or urinal?: A Little Help from another person bathing (including washing, rinsing, drying)?: A Lot Help from another person to put on and taking off regular upper body clothing?: A Little Help from another person to put on and taking off regular lower body clothing?: A Lot 6 Click Score: 17    End of Session Equipment Utilized During Treatment: Gait belt;Rolling walker (2 wheels);Other (comment) (3 in 1 over toilet)  OT Visit Diagnosis: Unsteadiness on feet (R26.81);Pain Pain - part of body:  (back)   Activity Tolerance Patient tolerated treatment well   Patient Left with call bell/phone within reach;in chair;with family/visitor present   Nurse Communication          Time: [id number] OT Time Calculation (min): 32 min  Charges: OT General Charges $OT Visit: 1 Visit OT Treatments $Self Care/Home Management : 8-22 mins $Therapeutic Activity: 8-22 mins    Galen Manila 12/26/2022, 1:06 PM

## 2022-12-26 NOTE — Progress Notes (Signed)
Patient ID: Pamela [name], female   DOB: [birth year], 77 y.o.   MRN: [id number] Vital signs are stable Patient has been immobilized but she is feeling somewhat nauseous Bowels have not moved since surgery Incision is clean and dry Motor function appears intact   Plan: Discharged to skilled nursing facility when bed available, will ask pharmacy to dose Coumadin so that patient can come off of Lovenox shots, add milk of magnesia encourage ambulation to stimulate bowels to move.

## 2022-12-26 NOTE — Progress Notes (Signed)
ANTICOAGULATION CONSULT NOTE - Follow Up Consult  Pharmacy Consult for Warfarin Indication: Protein S deficiency  Allergies  Allergen Reactions   Ketek [Telithromycin] Nausea And Vomiting and Rash   Loxapine Succinate Hives   Naproxen Sodium Anaphylaxis and Hives   Amoxapine And Related Hives   Darvon Nausea And Vomiting   Belsomra [Suvorexant] Other (See Comments)    "Nightmares"   Cymbalta [Duloxetine Hcl] Other (See Comments)    "Blackouts"   Duloxetine    Gabapentin Other (See Comments)    Blurry vision   Lyrica [Pregabalin]     Makes her sedated   Ozempic (0.25 Or 0.5 Mg-Dose) [Semaglutide(0.25 Or 0.5mg -Dos)] Diarrhea   Semaglutide Diarrhea    Rybelsus   Amoxicillin Rash   Propoxyphene Nausea And Vomiting      Labs: Recent Labs    12/25/22 0249 12/26/22 0318  HGB  --  10.7*  HCT  --  32.7*  PLT  --  193  LABPROT 16.3* 16.6*  INR 1.3* 1.4*    Estimated Creatinine Clearance: 32.9 mL/min (A) (by C-G formula based on SCr of 1.22 mg/dL (H)).  Assessment: S/p spinal surgery 4/18, warfarin resumed 4/20 with INR of 1.4 today On home dose of 6 mg daily  Goal of Therapy:  INR 2-3 Monitor platelets by anticoagulation protocol: Yes   Plan:  Continue Lovenox 40 mg sq daily Continue Warfarin 6 mg po daily for now - consider increase 4/23 Daily INR  Thank you Okey Regal, PharmD   12/26/2022,9:30 AM

## 2022-12-26 NOTE — Progress Notes (Signed)
Mobility Specialist Progress Note   12/26/22 1115  Mobility  Activity Ambulated with assistance in hallway  Level of Assistance Standby assist, set-up cues, supervision of patient - no hands on  Assistive Device Front wheel walker  Distance Ambulated (ft) 320 ft  Range of Motion/Exercises Active;All extremities  Activity Response Tolerated well   Patient received in recliner, eager to participate. Making progress with great effort and was able to extend ambulatory distance this session. Ambulated supervision level with slow steady gait. Required standing rest break x1 secondary to nausea and dry-heaving. Returned to room without incident. Was left in recliner with all needs met, call bell in reach.   Pamela [name], BS EXP Mobility Specialist Please contact via SecureChat or Rehab office at [phone]

## 2022-12-26 NOTE — TOC Progression Note (Addendum)
Transition of Care [facility]) - Progression Note    Patient Details  Name: Pamela [name] MRN: [id number] Date of Birth: [birth year]  [facility] of [facility]) CM/SW Contact  Lorri Frederick, LCSW Phone Number: 12/26/2022, 9:46 AM  Clinical Narrative:   CSW confirmed with [facility] that they can receive pt today.  Auth request submitted in [place].   1240: Auth request remains pending in [place].  1440: Call from [place], peer to peer offered: call [phone], option 5 by 10am Tuesday, 4/23.  Will need insurance ID number: [id number]   Attempting to contact MD.  Epic chat message sent, also called MD office but was placed in medical assistant voicemail, left message with details.  1610-called MD office again and left info  Expected Discharge Plan: Skilled Nursing Facility Barriers to Discharge: Continued Medical Work up, English as a second language teacher  Expected Discharge Plan and Services     Post Acute Care Choice: Skilled Nursing Facility Living arrangements for the past 2 months: Single Family Home                                       Social Determinants of Health (SDOH) Interventions SDOH Screenings   Food Insecurity: No Food Insecurity (12/24/2022)  Housing: Low Risk  (12/24/2022)  Transportation Needs: No Transportation Needs (12/24/2022)  Utilities: Not At Risk (12/24/2022)  Depression (PHQ2-9): Low Risk  (12/14/2022)  Financial Resource Strain: Low Risk  (12/10/2022)  Physical Activity: Inactive (12/10/2022)  Social Connections: Unknown (12/10/2022)  Stress: No Stress Concern Present (12/10/2022)  Tobacco Use: Low Risk  (12/23/2022)    Readmission Risk Interventions     No data to display

## 2022-12-26 NOTE — Plan of Care (Signed)
  Problem: Education: Goal: Knowledge of General Education information will improve Description: Including pain rating scale, medication(s)/side effects and non-pharmacologic comfort measures Outcome: Progressing   Problem: Health Behavior/Discharge Planning: Goal: Ability to manage health-related needs will improve Outcome: Progressing   Problem: Clinical Measurements: Goal: Ability to maintain clinical measurements within normal limits will improve Outcome: Progressing Goal: Will remain free from infection Outcome: Progressing Goal: Diagnostic test results will improve Outcome: Progressing Goal: Respiratory complications will improve Outcome: Progressing Goal: Cardiovascular complication will be avoided Outcome: Progressing   Problem: Activity: Goal: Risk for activity intolerance will decrease Outcome: Progressing   Problem: Nutrition: Goal: Adequate nutrition will be maintained Outcome: Progressing   Problem: Coping: Goal: Level of anxiety will decrease Outcome: Progressing   Problem: Elimination: Goal: Will not experience complications related to bowel motility Outcome: Progressing Goal: Will not experience complications related to urinary retention Outcome: Progressing   Problem: Pain Managment: Goal: General experience of comfort will improve Outcome: Progressing   Problem: Safety: Goal: Ability to remain free from injury will improve Outcome: Progressing   Problem: Skin Integrity: Goal: Risk for impaired skin integrity will decrease Outcome: Progressing   Problem: Education: Goal: Ability to describe self-care measures that may prevent or decrease complications (Diabetes Survival Skills Education) will improve Outcome: Progressing Goal: Individualized Educational Video(s) Outcome: Progressing   

## 2022-12-26 NOTE — Plan of Care (Signed)
  Problem: Education: Goal: Knowledge of General Education information will improve Description: Including pain rating scale, medication(s)/side effects and non-pharmacologic comfort measures 12/26/2022 0120 by Duffy Bruce, RN Outcome: Progressing 12/26/2022 0120 by Duffy Bruce, RN Outcome: Progressing 12/26/2022 0120 by Duffy Bruce, RN Outcome: Progressing   Problem: Activity: Goal: Risk for activity intolerance will decrease 12/26/2022 0120 by Duffy Bruce, RN Outcome: Progressing 12/26/2022 0120 by Duffy Bruce, RN Outcome: Progressing 12/26/2022 0120 by Duffy Bruce, RN Outcome: Progressing   Problem: Nutrition: Goal: Adequate nutrition will be maintained 12/26/2022 0120 by Duffy Bruce, RN Outcome: Progressing 12/26/2022 0120 by Duffy Bruce, RN Outcome: Progressing 12/26/2022 0120 by Duffy Bruce, RN Outcome: Progressing   Problem: Coping: Goal: Level of anxiety will decrease 12/26/2022 0120 by Duffy Bruce, RN Outcome: Progressing 12/26/2022 0120 by Duffy Bruce, RN Outcome: Progressing 12/26/2022 0120 by Duffy Bruce, RN Outcome: Progressing   Problem: Elimination: Goal: Will not experience complications related to bowel motility 12/26/2022 0120 by Duffy Bruce, RN Outcome: Progressing 12/26/2022 0120 by Duffy Bruce, RN Outcome: Progressing 12/26/2022 0120 by Duffy Bruce, RN Outcome: Progressing Goal: Will not experience complications related to urinary retention 12/26/2022 0120 by Duffy Bruce, RN Outcome: Progressing 12/26/2022 0120 by Duffy Bruce, RN Outcome: Progressing 12/26/2022 0120 by Duffy Bruce, RN Outcome: Progressing   Problem: Safety: Goal: Ability to remain free from injury will improve 12/26/2022 0120 by Duffy Bruce, RN Outcome: Progressing 12/26/2022 0120 by Duffy Bruce, RN Outcome: Progressing 12/26/2022 0120 by Duffy Bruce, RN Outcome: Progressing

## 2022-12-27 ENCOUNTER — Telehealth: Payer: Self-pay

## 2022-12-27 DIAGNOSIS — N183 Chronic kidney disease, stage 3 unspecified: Secondary | ICD-10-CM | POA: Diagnosis not present

## 2022-12-27 DIAGNOSIS — E039 Hypothyroidism, unspecified: Secondary | ICD-10-CM | POA: Diagnosis not present

## 2022-12-27 DIAGNOSIS — I480 Paroxysmal atrial fibrillation: Secondary | ICD-10-CM | POA: Diagnosis not present

## 2022-12-27 DIAGNOSIS — R54 Age-related physical debility: Secondary | ICD-10-CM | POA: Insufficient documentation

## 2022-12-27 DIAGNOSIS — R791 Abnormal coagulation profile: Secondary | ICD-10-CM | POA: Diagnosis not present

## 2022-12-27 DIAGNOSIS — E1151 Type 2 diabetes mellitus with diabetic peripheral angiopathy without gangrene: Secondary | ICD-10-CM | POA: Diagnosis not present

## 2022-12-27 DIAGNOSIS — M48062 Spinal stenosis, lumbar region with neurogenic claudication: Secondary | ICD-10-CM | POA: Diagnosis not present

## 2022-12-27 DIAGNOSIS — N390 Urinary tract infection, site not specified: Secondary | ICD-10-CM | POA: Diagnosis not present

## 2022-12-27 DIAGNOSIS — M5416 Radiculopathy, lumbar region: Secondary | ICD-10-CM | POA: Diagnosis not present

## 2022-12-27 DIAGNOSIS — R262 Difficulty in walking, not elsewhere classified: Secondary | ICD-10-CM | POA: Diagnosis not present

## 2022-12-27 DIAGNOSIS — G8918 Other acute postprocedural pain: Secondary | ICD-10-CM | POA: Diagnosis not present

## 2022-12-27 DIAGNOSIS — R269 Unspecified abnormalities of gait and mobility: Secondary | ICD-10-CM | POA: Insufficient documentation

## 2022-12-27 DIAGNOSIS — Z7901 Long term (current) use of anticoagulants: Secondary | ICD-10-CM | POA: Diagnosis not present

## 2022-12-27 DIAGNOSIS — E1122 Type 2 diabetes mellitus with diabetic chronic kidney disease: Secondary | ICD-10-CM | POA: Diagnosis not present

## 2022-12-27 DIAGNOSIS — M549 Dorsalgia, unspecified: Secondary | ICD-10-CM | POA: Diagnosis not present

## 2022-12-27 DIAGNOSIS — Z86718 Personal history of other venous thrombosis and embolism: Secondary | ICD-10-CM | POA: Diagnosis not present

## 2022-12-27 DIAGNOSIS — M6281 Muscle weakness (generalized): Secondary | ICD-10-CM | POA: Insufficient documentation

## 2022-12-27 DIAGNOSIS — Z8673 Personal history of transient ischemic attack (TIA), and cerebral infarction without residual deficits: Secondary | ICD-10-CM | POA: Diagnosis not present

## 2022-12-27 DIAGNOSIS — I4891 Unspecified atrial fibrillation: Secondary | ICD-10-CM | POA: Diagnosis not present

## 2022-12-27 DIAGNOSIS — J45909 Unspecified asthma, uncomplicated: Secondary | ICD-10-CM | POA: Diagnosis not present

## 2022-12-27 DIAGNOSIS — R2681 Unsteadiness on feet: Secondary | ICD-10-CM | POA: Insufficient documentation

## 2022-12-27 LAB — GLUCOSE, CAPILLARY
Glucose-Capillary: 154 mg/dL — ABNORMAL HIGH (ref 70–99)
Glucose-Capillary: 123 mg/dL — ABNORMAL HIGH (ref 70–99)
Glucose-Capillary: 202 mg/dL — ABNORMAL HIGH (ref 70–99)

## 2022-12-27 LAB — PROTIME-INR
INR: 1.5 — ABNORMAL HIGH (ref 0.8–1.2)
Prothrombin Time: 18.1 seconds — ABNORMAL HIGH (ref 11.4–15.2)

## 2022-12-27 MED ORDER — WARFARIN SODIUM 7.5 MG PO TABS: 7.5000 mg | ORAL_TABLET | Freq: Once | ORAL | Status: DC

## 2022-12-27 MED ORDER — OXYCODONE-ACETAMINOPHEN 5-325 MG PO TABS: 1.0000 | ORAL_TABLET | Freq: Four times a day (QID) | ORAL | 0 refills | Status: DC | PRN

## 2022-12-27 MED ORDER — METHOCARBAMOL 500 MG PO TABS: 500.0000 mg | ORAL_TABLET | Freq: Four times a day (QID) | ORAL | 2 refills | Status: DC | PRN

## 2022-12-27 NOTE — TOC Transition Note (Signed)
Transition of Care [facility]) - CM/SW Discharge Note   Patient Details  Name: Pamela [name] MRN: [id number] Date of Birth: 08-24-1946  Transition of Care [facility]) CM/SW Contact:  Lorri Frederick, LCSW Phone Number: 12/27/2022, 11:40 AM   Clinical Narrative:   Pt discharging to [facility] [place] room 703. RN call report to [phone].  Pt husband will provide transportation and should be here shortly.    Final next level of care: Skilled Nursing Facility Barriers to Discharge: Barriers Resolved   Patient Goals and CMS Choice CMS Medicare.gov Compare Post Acute Care list provided to:: Patient Choice offered to / list presented to : Patient  Discharge Placement                Patient chooses bed at: [facility] [place] Patient to be transferred to facility by: husband Name of family member notified: [name] in room Patient and family notified of of transfer: 12/27/22  Discharge Plan and Services Additional resources added to the After Visit Summary for       Post Acute Care Choice: Skilled Nursing Facility                               Social Determinants of Health (SDOH) Interventions SDOH Screenings   Food Insecurity: No Food Insecurity (12/24/2022)  Housing: Low Risk  (12/24/2022)  Transportation Needs: No Transportation Needs (12/24/2022)  Utilities: Not At Risk (12/24/2022)  Depression (PHQ2-9): Low Risk  (12/14/2022)  Financial Resource Strain: Low Risk  (12/10/2022)  Physical Activity: Inactive (12/10/2022)  Social Connections: Unknown (12/10/2022)  Stress: No Stress Concern Present (12/10/2022)  Tobacco Use: Low Risk  (12/23/2022)     Readmission Risk Interventions     No data to display

## 2022-12-27 NOTE — Discharge Summary (Signed)
Physician Discharge Summary  Patient ID: Pamela [name] MRN: [id number] DOB/AGE: [birth year] 78 y.o.  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  Patient did have significant preoperative problems with ambulating independently and these continued postoperatively.  She was evaluated by physical therapy and Occupational Therapy it was felt that she would require some further convalescence and physical therapy to become independent.  She is now being transferred to a skilled nursing facility.  Incision on her back is clean and dry.  Consults: None  Significant Diagnostic Studies: None  Treatments: surgery: See op note  Discharge Exam: Blood pressure (!) 155/72, pulse 85, temperature 97.7 F (36.5 C), temperature source Oral, resp. rate 17, height 5' (1.524 m), weight 68.9 kg, SpO2 100 %. Incision is clean and dry motor function is intact in the lower extremities save for modest weakness in iliopsoas and quadriceps which gives her a wide-based gait.  Disposition: Discharge disposition: 03-Skilled Nursing Facility       Discharge Instructions     Call MD for:  redness, tenderness, or signs of infection (pain, swelling, redness, odor or green/yellow discharge around incision site)   Complete by: As directed    Call MD for:  severe uncontrolled pain   Complete by: As directed    Call MD for:  temperature >100.4   Complete by: As directed    Diet - low sodium heart healthy   Complete by: As directed    Incentive spirometry RT   Complete by: As directed    Increase activity slowly    Complete by: As directed       Allergies as of 12/27/2022       Reactions   Ketek [telithromycin] Nausea And Vomiting, Rash   Loxapine Succinate Hives   Naproxen Sodium Anaphylaxis, Hives   Amoxapine And Related Hives   Darvon Nausea And Vomiting   Belsomra [suvorexant] Other (See Comments)   "Nightmares"   Cymbalta [duloxetine Hcl] Other (See Comments)   "Blackouts"   Duloxetine    Gabapentin Other (See Comments)   Blurry vision   Lyrica [pregabalin]    Makes her sedated   Ozempic (0.25 Or 0.5 Mg-dose) [semaglutide(0.25 Or 0.5mg -dos)] Diarrhea   Semaglutide Diarrhea   Rybelsus   Amoxicillin Rash   Propoxyphene Nausea And Vomiting        Medication List     TAKE these medications    acetaminophen 650 MG CR tablet Commonly known as: TYLENOL Take 650-1,300 mg by mouth every 8 (eight) hours as needed for pain.   albuterol 108 (90 Base) MCG/ACT inhaler Commonly known as: VENTOLIN HFA INHALE 1 TO 2 PUFFS INTO THE LUNGS EVERY 6 HOURS AS NEEDED FOR WHEEZING OR SHORTNESS OF BREATH   amLODipine 5 MG tablet Commonly known as: NORVASC TAKE 1 TABLET(5 MG) BY MOUTH DAILY   Aspirin Low Dose 81 MG tablet Generic drug: aspirin EC TAKE 1 TABLET(81 MG) BY MOUTH DAILY   atorvastatin 80 MG tablet Commonly known as: LIPITOR TAKE 1 TABLET(80 MG) BY MOUTH DAILY   BD Pen Needle Nano 2nd Gen 32G X 4 MM Misc Generic drug: Insulin Pen Needle USE DAILY AS DIRECTED   CALCIUM CITRATE + PO  Take 1 tablet by mouth daily.   carboxymethylcellulose 0.5 % Soln Commonly known as: REFRESH PLUS Place 1 drop into both eyes 3 (three) times daily as needed (dry eyes).   CINNAMON PO Take 1,000 mg by mouth daily.   clindamycin 300 MG capsule Commonly known as: CLEOCIN Take 300 mg by mouth See admin instructions. 1 hour before dental procedures   cyanocobalamin 1000 MCG tablet Commonly known as: VITAMIN B12 Take 1,000 mcg by mouth daily.   dapagliflozin propanediol 10 MG Tabs  tablet Commonly known as: Farxiga Take 1 tablet (10 mg total) by mouth daily before breakfast.   enoxaparin 40 MG/0.4ML injection Commonly known as: LOVENOX Inject 0.4 mLs (40 mg total) into the skin every 12 (twelve) hours.   estradiol 0.1 MG/GM vaginal cream Commonly known as: ESTRACE Place 1 Applicatorful vaginally 2 (two) times a week. As needed   ferrous sulfate 325 (65 FE) MG tablet Take 325 mg by mouth daily with lunch.   FreeStyle Libre 2 Sensor Misc 2 each by Does not apply route every 14 (fourteen) days. E11.40   furosemide 40 MG tablet Commonly known as: LASIX Take 1 tablet (40 mg total) by mouth daily.   Gemtesa 75 MG Tabs Generic drug: Vibegron Take 75 mg by mouth at bedtime.   Insulin Aspart FlexPen 100 UNIT/ML Commonly known as: NOVOLOG Max daily 30 units What changed:  how much to take how to take this when to take this   insulin glargine (1 Unit Dial) 300 UNIT/ML Solostar Pen Commonly known as: TOUJEO Inject 8 Units into the skin at bedtime. What changed: how much to take   ipratropium-albuterol 0.5-2.5 (3) MG/3ML Soln Commonly known as: DUONEB Take 3 mLs by nebulization 4 (four) times daily as needed (wheezing/shortness of breath).   isosorbide mononitrate 30 MG 24 hr tablet Commonly known as: IMDUR 1 tablet by mouth daily, 2nd attempt, please reach arrange appt for further refills   levothyroxine 25 MCG tablet Commonly known as: SYNTHROID Take 1 tablet (25 mcg total) by mouth daily before breakfast.   lisinopril 20 MG tablet Commonly known as: ZESTRIL TAKE 2 TABLETS(40 MG) BY MOUTH TWICE DAILY   magnesium oxide 400 (240 Mg) MG tablet Commonly known as: MAG-OX TAKE 2 TABLETS(800 MG) BY MOUTH TWICE DAILY What changed: See the new instructions.   methocarbamol 750 MG tablet Commonly known as: ROBAXIN Take 750 mg by mouth at bedtime. What changed: Another medication with the same name was added. Make sure you understand how and when to  take each.   methocarbamol 500 MG tablet Commonly known as: ROBAXIN Take 1 tablet (500 mg total) by mouth every 6 (six) hours as needed for muscle spasms. What changed: You were already taking a medication with the same name, and this prescription was added. Make sure you understand how and when to take each.   MiraLax 17 GM/SCOOP powder Generic drug: polyethylene glycol powder Take 17 g by mouth daily as needed for moderate constipation.   nystatin cream Commonly known as: MYCOSTATIN Apply 1 Application topically 4 (four) times daily as needed for dry skin (yeast infection).   ondansetron 8 MG disintegrating tablet Commonly known as: ZOFRAN-ODT Take 8 mg by mouth every 8 (eight) hours as needed for nausea or vomiting.   oxyCODONE-acetaminophen 5-325 MG tablet Commonly known as: PERCOCET/ROXICET Take 1 tablet by mouth every 6 (six) hours as needed for moderate pain or severe pain.   pantoprazole 40 MG tablet Commonly known as: PROTONIX TAKE 1  TABLET(40 MG) BY MOUTH EVERY MORNING   PRESERVISION AREDS PO Take 1 capsule by mouth 2 (two) times daily.   Prolia 60 MG/ML Sosy injection Generic drug: denosumab Inject 60 mg into the skin every 6 (six) months.   pyridoxine 100 MG tablet Commonly known as: B-6 Take 100 mg by mouth daily.   sennosides-docusate sodium 8.6-50 MG tablet Commonly known as: SENOKOT-S Take 1 tablet by mouth daily.   sodium chloride 0.65 % Soln nasal spray Commonly known as: OCEAN Place 1 spray into both nostrils as needed for congestion.   traMADol 50 MG tablet Commonly known as: ULTRAM Take 50-100 mg by mouth every 6 (six) hours as needed.   traZODone 50 MG tablet Commonly known as: DESYREL Take 50 mg by mouth at bedtime.   Vitamin D3 50 MCG (2000 UT) Tabs Take 4,000 Units by mouth daily with lunch.   warfarin 1 MG tablet Commonly known as: COUMADIN Take as directed. If you are unsure how to take this medication, talk to your nurse or  doctor. Original instructions: Take 1 mg by mouth every evening. Take with 5 mg tab to get 6 mg   warfarin 5 MG tablet Commonly known as: COUMADIN Take as directed. If you are unsure how to take this medication, talk to your nurse or doctor. Original instructions: Take 5 mg by mouth every evening. Take with 1 mg tab to get 6 mg         Signed: Stefani Dama 12/27/2022, 10:51 AM

## 2022-12-27 NOTE — Telephone Encounter (Signed)
Pt called to report that surgery went well. She is being discharged to [facility] for rehab. Her INR today is 1.5. She will receive warfarin 7.5mg  today. Pt is tired of the Lovenox injections. She has many knots all over her abdomen. She wants to know when she can stop the Lovenox injections? "I'll take whatever dose of warfarin they want me too".   Per Harvin Hazel Mosher,PA: I'm sorry, but she should follow the instructions given by Dr. Isaiah Serge, Hem[facility]Onc @ [facility]. She can't stop lovenox until her INR is above 2.

## 2022-12-27 NOTE — Progress Notes (Signed)
Patient ID: Pamela [name], female   DOB: Apr 07, 1946, 77 y.o.   MRN: [id number] Vital signs remained stable.  I had a peer to peer review with physician from patient's primary insurance company and her skilled nursing facility stay has been approved.  I will write discharge for today.

## 2022-12-27 NOTE — Progress Notes (Signed)
Physical Therapy Treatment Patient Details Name: Hydeia Mcatee MRN: [id number] DOB: [birth year] Today's Date: 12/27/2022   History of Present Illness 77 y.o. female admitted on 12/22/2022 with c/o lower extremity pain and weakeness. She is s/p Laminectomy L2-3 and L3-4 with decompression. PMH of MD, GER, HTN, OA, Osteoporosis, CKD, DDD, Hand surgery, ORIF L ankle, TKA    PT Comments    Pt was received sitting in chair and agreeable to session. Session focused on bed mobility due to pt reporting difficulty with log roll technique. Pt able to perform multiple trials of rolling, sidelying to sit, and sit to sidelying with proper technique. Focus on hand placement and sequencing with pt able to demo with up to min guard, but no physical assist needed. Discussed home set up and pt's safety concerns with returning home. Performed seated exercises, however pt reporting pain in L thigh from "hematoma" per pt report and noted knot on thigh, however therapist could not visualize due to pt's pants, nursing aware. Pt continues to benefit from PT services to progress toward functional mobility goals.     Recommendations for follow up therapy are one component of a multi-disciplinary discharge planning process, led by the attending physician.  Recommendations may be updated based on patient status, additional functional criteria and insurance authorization.  Follow Up Recommendations  Can patient physically be transported by private vehicle: Yes    Assistance Recommended at Discharge Frequent or constant Supervision/Assistance  Patient can return home with the following A little help with walking and/or transfers;A little help with bathing/dressing/bathroom;Assistance with cooking/housework;Assist for transportation;Help with stairs or ramp for entrance   Equipment Recommendations  None recommended by PT    Recommendations for Other Services       Precautions / Restrictions  Precautions Precautions: Back;Fall Precaution Booklet Issued: Yes (comment) Precaution Comments: Reviewed precautions Restrictions Weight Bearing Restrictions: No     Mobility  Bed Mobility Overal bed mobility: Needs Assistance Bed Mobility: Rolling, Sidelying to Sit, Sit to Sidelying Rolling: Min guard Sidelying to sit: Min guard     Sit to sidelying: Min guard General bed mobility comments: Practiced sitting to EOB and returning to supine x3 with log roll technique. Cues for sequencing and technique, but no physical assist needed. Cues for BLE positioning for rolling to reduce stress on low back.    Transfers Overall transfer level: Needs assistance Equipment used: Rolling walker (2 wheels) Transfers: Sit to/from Stand Sit to Stand: Supervision           General transfer comment: From EOB and recliner with supervision for safety. Pt demonstrating safe hand placement and slow, steady power up.    Ambulation/Gait Ambulation/Gait assistance: Min guard Gait Distance (Feet): 4 Feet (x2) Assistive device: Rolling walker (2 wheels) Gait Pattern/deviations: Step-through pattern, Decreased stride length Gait velocity: Decreased     General Gait Details: slow step-through pattern with pt focused on upright posture.       Balance Overall balance assessment: Needs assistance Sitting-balance support: Feet supported Sitting balance-Leahy Scale: Good Sitting balance - Comments: sitting EOB and in recliner   Standing balance support: Bilateral upper extremity supported, During functional activity Standing balance-Leahy Scale: Fair Standing balance comment: with RW support                            Cognition Arousal/Alertness: Awake/alert Behavior During Therapy: WFL for tasks assessed/performed Overall Cognitive Status: Within Functional Limits for tasks assessed  Exercises General Exercises -  Lower Extremity Ankle Circles/Pumps: AROM, Seated, Both, 5 reps Long Arc Quad: AROM, Both, 5 reps, Seated    General Comments        Pertinent Vitals/Pain Pain Assessment Pain Assessment: Faces Faces Pain Scale: Hurts little more Pain Location: back Pain Descriptors / Indicators: Tightness, Discomfort, Guarding, Grimacing Pain Intervention(s): Monitored during session, Repositioned     PT Goals (current goals can now be found in the care plan section) Acute Rehab PT Goals Patient Stated Goal: Be able to return home PT Goal Formulation: With patient/family Time For Goal Achievement: 12/30/22 Potential to Achieve Goals: Good Progress towards PT goals: Progressing toward goals    Frequency    Min 5X/week      PT Plan Current plan remains appropriate       AM-PAC PT "6 Clicks" Mobility   Outcome Measure  Help needed turning from your back to your side while in a flat bed without using bedrails?: A Little Help needed moving from lying on your back to sitting on the side of a flat bed without using bedrails?: A Little Help needed moving to and from a bed to a chair (including a wheelchair)?: A Little Help needed standing up from a chair using your arms (e.g., wheelchair or bedside chair)?: A Little Help needed to walk in hospital room?: A Little Help needed climbing 3-5 steps with a railing? : A Little 6 Click Score: 18    End of Session Equipment Utilized During Treatment: Gait belt Activity Tolerance: Patient tolerated treatment well Patient left: with family/visitor present;with call bell/phone within reach;in chair Nurse Communication: Mobility status PT Visit Diagnosis: Unsteadiness on feet (R26.81);Pain     Time: 5784-6962 PT Time Calculation (min) (ACUTE ONLY): 40 min  Charges:  $Therapeutic Exercise: 8-22 mins $Therapeutic Activity: 23-37 mins                     Johny Shock, PTA Acute Rehabilitation Services Secure Chat Preferred  [phone]  [phone]    Johny Shock 12/27/2022, 11:13 AM

## 2022-12-27 NOTE — TOC Progression Note (Addendum)
Transition of Care [facility]) - Progression Note    Patient Details  Name: Pamela [name] MRN: [id number] Date of Birth: Jan 03, 1946  [facility] CM/SW Contact  Lorri Frederick, LCSW Phone Number: 12/27/2022, 11:38 AM  Clinical Narrative:   Spoke with Dr Danielle Dess, he completed P2P, auth was approved.  Per [facility]: [id number], [id number], 3 days: 4/22-4/24.    Confirmed with [facility] they can accept pt today.   Spoke with pt and daughter Pamela [name] about providing transportation.  Pt husband will be here shortly to transport.    1300: Pain med hard script needs signature.  MD office was notified earlier  Expected Discharge Plan: Skilled Nursing Facility Barriers to Discharge: Continued Medical Work up, English as a second language teacher  Expected Discharge Plan and Services     Post Acute Care Choice: Skilled Nursing Facility Living arrangements for the past 2 months: Single Family Home Expected Discharge Date: 12/27/22                                     Social Determinants of Health (SDOH) Interventions SDOH Screenings   Food Insecurity: No Food Insecurity (12/24/2022)  Housing: Low Risk  (12/24/2022)  Transportation Needs: No Transportation Needs (12/24/2022)  Utilities: Not At Risk (12/24/2022)  Depression (PHQ2-9): Low Risk  (12/14/2022)  Financial Resource Strain: Low Risk  (12/10/2022)  Physical Activity: Inactive (12/10/2022)  Social Connections: Unknown (12/10/2022)  Stress: No Stress Concern Present (12/10/2022)  Tobacco Use: Low Risk  (12/23/2022)    Readmission Risk Interventions     No data to display

## 2022-12-27 NOTE — Progress Notes (Signed)
ANTICOAGULATION CONSULT NOTE - Follow Up Consult  Pharmacy Consult for Warfarin Indication: Protein S deficiency  Allergies  Allergen Reactions   Ketek [Telithromycin] Nausea And Vomiting and Rash   Loxapine Succinate Hives   Naproxen Sodium Anaphylaxis and Hives   Amoxapine And Related Hives   Darvon Nausea And Vomiting   Belsomra [Suvorexant] Other (See Comments)    "Nightmares"   Cymbalta [Duloxetine Hcl] Other (See Comments)    "Blackouts"   Duloxetine    Gabapentin Other (See Comments)    Blurry vision   Lyrica [Pregabalin]     Makes her sedated   Ozempic (0.25 Or 0.5 Mg-Dose) [Semaglutide(0.25 Or 0.5mg -Dos)] Diarrhea   Semaglutide Diarrhea    Rybelsus   Amoxicillin Rash   Propoxyphene Nausea And Vomiting      Labs: Recent Labs    12/25/22 0249 12/26/22 0318 12/27/22 0158  HGB  --  10.7*  --   HCT  --  32.7*  --   PLT  --  193  --   LABPROT 16.3* 16.6* 18.1*  INR 1.3* 1.4* 1.5*     Estimated Creatinine Clearance: 32.9 mL/min (A) (by C-G formula based on SCr of 1.22 mg/dL (H)).  Assessment: S/p spinal surgery 4/18, warfarin resumed 4/20.  Home dose of 6 mg daily  INR 1.5 today  Goal of Therapy:  INR 2-3 Monitor platelets by anticoagulation protocol: Yes   Plan:  Continue Lovenox 40 mg sq daily Warfarin 7.5 mg po x 1 dose today Daily INR  Thank you Okey Regal, PharmD   12/27/2022,9:32 AM

## 2022-12-27 NOTE — Progress Notes (Signed)
MD signed prescription. I walked with her and family to discharge circle.

## 2022-12-27 NOTE — Progress Notes (Signed)
Patient is being discharged to [facility]. I called and gave report to Howard,LPN. We are waiting for the MD or someone on his team to sign off on her prescriptions before discharge. Husband is at bedside waiting, he will be taking her to [facility].

## 2022-12-27 NOTE — Progress Notes (Signed)
I called MD office I was transferred to the medical assistant line. I left a voicemail letting them know that patient needs her scripts signed. I stopped by th patient room to let her know we was still waiting. She stated she does not like oxycodone and to give her tylenol instead. Charge nurse went to get her medication from pharmacy and patient will be leaving shortly.

## 2022-12-27 NOTE — Plan of Care (Signed)
  Problem: Education: Goal: Knowledge of General Education information will improve Description Including pain rating scale, medication(s)/side effects and non-pharmacologic comfort measures Outcome: Progressing   Problem: Health Behavior/Discharge Planning: Goal: Ability to manage health-related needs will improve Outcome: Progressing   Problem: Clinical Measurements: Goal: Will remain free from infection Outcome: Progressing   Problem: Activity: Goal: Risk for activity intolerance will decrease Outcome: Progressing   Problem: Nutrition: Goal: Adequate nutrition will be maintained Outcome: Progressing   Problem: Coping: Goal: Level of anxiety will decrease Outcome: Progressing   Problem: Pain Managment: Goal: General experience of comfort will improve Outcome: Progressing   Problem: Safety: Goal: Ability to remain free from injury will improve Outcome: Progressing   Problem: Skin Integrity: Goal: Risk for impaired skin integrity will decrease Outcome: Progressing   

## 2022-12-29 DIAGNOSIS — E1151 Type 2 diabetes mellitus with diabetic peripheral angiopathy without gangrene: Secondary | ICD-10-CM | POA: Diagnosis not present

## 2022-12-29 DIAGNOSIS — G8918 Other acute postprocedural pain: Secondary | ICD-10-CM | POA: Diagnosis not present

## 2022-12-29 DIAGNOSIS — R262 Difficulty in walking, not elsewhere classified: Secondary | ICD-10-CM | POA: Diagnosis not present

## 2022-12-29 DIAGNOSIS — M549 Dorsalgia, unspecified: Secondary | ICD-10-CM | POA: Diagnosis not present

## 2023-01-05 ENCOUNTER — Telehealth: Payer: Self-pay

## 2023-01-05 NOTE — Progress Notes (Signed)
Care Management & Coordination Services Pharmacy Team  Reason for Encounter: General adherence update   Contacted patient for general health update and medication adherence call.  Spoke with patient on 01/12/2023    What concerns do you have about your medications? No concerns   The patient denies side effects with their medications.   How often do you forget or accidentally miss a dose? Rarely  Do you use a pillbox? Yes  Are you having any problems getting your medications from your pharmacy? No  Has the cost of your medications been a concern? No  Since last visit with PharmD, the following interventions have been made. Pt was advised to start taking her Gemtesa during the day verses night because it was making her get up every 2 hours during the night. She has not had that issues since taking it during the day. Pt completed course of the Levenox. She stated she developed a large Hematoma on her abdomen and this has resolved and she is thankful being off the Levenox. [facility] decreased her Lisinopril to 20mg  BID, notes are under media.   The patient has had an ED visit since last contact. Pt had surgery on her back on 12/22/22. Reports everything went great. She is having left leg pain in between her knee and foot but she just had her post op appt and Dr. Charline Bills she is doing great. She was released and is going to the bluegrass concert this weekend.   The patient denies problems with their health.   Patient denies concerns or questions for Artelia Laroche, PharmD at this time.   Counseled patient on: Importance of taking medication daily without missed doses   Chart Updates:  Recent office visits:  01/11/23 Dellia Beckwith LPN (Oncology) Telephone encounter. Patient notified to hold warfarin today and then resume 5mg  daily tomorrow. Repeat INR in 1 week.   12/16/22 Gwynneth Aliment CMA. Orders only. Started on Levothyroxine daily.  12/14/22 Blane Ohara MD.  (PCP). Seen for follow up. No med changes.   Recent consult visits:  12/27/22 Rachell Cipro RN. (Oncology) Telephone encounter. Her INR today is 1.5. She will receive warfarin 7.5mg  today.  12/19/22 Crista Elliot MD. ([facility]). Seen for CKD. Decreased Lisinopril to 20mg  BID from 80mg .   12/15/22 Belva Crome PA-C. (Oncology) Telephone encounter. She states she is to start Lovenox daily after surgery and then increase to twice daily when Dr. Danielle Dess is comfortable   12/12/22 Romona Curls, Amy RN. (Oncology). Telephone encounter. Increase to coumadin 6mg  po qd per Dr Melvyn Neth   12/07/22 Romona Curls, Amy RN. Telephone encounter. Dr Melvyn Neth is in agreement with Coumadin 5mg  po qd. Lovenox 40mg  SQ BID x 5 days.   12/06/22 Rennis Harding MD. (Hematology). Seen for follow up. Ordered Enoxaparin 40mg /0.9ml inject every 12 hours.   12/05/22 Rennis Harding MD. (Oncology) Seen for follow up. No med changes.   Hospital visits:  Medication Reconciliation was completed by comparing discharge summary, patient's EMR and Pharmacy list, and upon discussion with patient.  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 Discharge date was 12/27/22. Discharged from [facility].    New?Medications Started at [facility] Discharge:?? -started on oxyCODONE-acetaminophen q6h prn   CHANGE how you take: methocarbamol (ROBAXIN) 750mg  at bedtime  Medications Discontinued at Hospital Discharge: None  Medications that remain the same after Hospital Discharge:??  -All other medications will remain the same.    Medications: Outpatient Encounter Medications as of 01/05/2023  Medication Sig Note   acetaminophen (TYLENOL) 650 MG CR tablet Take 650-1,300 mg by mouth every 8 (eight) hours as needed for pain.    albuterol (VENTOLIN HFA) 108 (90 Base) MCG/ACT inhaler INHALE 1 TO 2 PUFFS INTO THE LUNGS EVERY 6 HOURS AS NEEDED FOR WHEEZING OR SHORTNESS OF BREATH    amLODipine  (NORVASC) 5 MG tablet TAKE 1 TABLET(5 MG) BY MOUTH DAILY    aspirin EC (ASPIRIN LOW DOSE) 81 MG tablet TAKE 1 TABLET(81 MG) BY MOUTH DAILY    atorvastatin (LIPITOR) 80 MG tablet TAKE 1 TABLET(80 MG) BY MOUTH DAILY    Calcium Citrate-Vitamin D (CALCIUM CITRATE + PO) Take 1 tablet by mouth daily.    carboxymethylcellulose (REFRESH PLUS) 0.5 % SOLN Place 1 drop into both eyes 3 (three) times daily as needed (dry eyes).    Cholecalciferol (VITAMIN D3) 50 MCG (2000 UT) TABS Take 4,000 Units by mouth daily with lunch.    CINNAMON PO Take 1,000 mg by mouth daily.    clindamycin (CLEOCIN) 300 MG capsule Take 300 mg by mouth See admin instructions. 1 hour before dental procedures    Continuous Blood Gluc Sensor (FREESTYLE LIBRE 2 SENSOR) MISC 2 each by Does not apply route every 14 (fourteen) days. E11.40    cyanocobalamin (VITAMIN B12) 1000 MCG tablet Take 1,000 mcg by mouth daily.    dapagliflozin propanediol (FARXIGA) 10 MG TABS tablet Take 1 tablet (10 mg total) by mouth daily before breakfast.    denosumab (PROLIA) 60 MG/ML SOSY injection Inject 60 mg into the skin every 6 (six) months.    enoxaparin (LOVENOX) 40 MG/0.4ML injection Inject 0.4 mLs (40 mg total) into the skin every 12 (twelve) hours. (Patient not taking: Reported on 12/20/2022)    estradiol (ESTRACE) 0.1 MG/GM vaginal cream Place 1 Applicatorful vaginally 2 (two) times a week. As needed    ferrous sulfate 325 (65 FE) MG tablet Take 325 mg by mouth daily with lunch.    furosemide (LASIX) 40 MG tablet Take 1 tablet (40 mg total) by mouth daily.    GEMTESA 75 MG TABS Take 75 mg by mouth at bedtime.    Insulin Aspart FlexPen (NOVOLOG) 100 UNIT/ML Max daily 30 units (Patient taking differently: Inject 4-5 Units into the skin 3 (three) times daily with meals. Max daily 30 units)    insulin glargine, 1 Unit Dial, (TOUJEO) 300 UNIT/ML Solostar Pen Inject 8 Units into the skin at bedtime. (Patient taking differently: Inject 6 Units into the skin  at bedtime.) 12/22/2022: Patient states she took 3 units at 6 pm 12/21/22   Insulin Pen Needle (BD PEN NEEDLE NANO 2ND GEN) 32G X 4 MM MISC USE DAILY AS DIRECTED    ipratropium-albuterol (DUONEB) 0.5-2.5 (3) MG/3ML SOLN Take 3 mLs by nebulization 4 (four) times daily as needed (wheezing/shortness of breath).    isosorbide mononitrate (IMDUR) 30 MG 24 hr tablet 1 tablet by mouth daily, 2nd attempt, please reach arrange appt for further refills    levothyroxine (SYNTHROID) 25 MCG tablet Take 1 tablet (25 mcg total) by mouth daily before breakfast.    lisinopril (ZESTRIL) 20 MG tablet TAKE 2 TABLETS(40 MG) BY MOUTH TWICE DAILY    magnesium oxide (MAG-OX) 400 (240 Mg) MG tablet TAKE 2 TABLETS(800 MG) BY MOUTH TWICE DAILY (Patient taking differently: Take 800 mg by mouth 2 (two) times daily.)    methocarbamol (ROBAXIN) 500 MG tablet Take 1 tablet (500 mg total) by mouth every 6 (six) hours as needed  for muscle spasms.    methocarbamol (ROBAXIN) 750 MG tablet Take 750 mg by mouth at bedtime.    Multiple Vitamins-Minerals (PRESERVISION AREDS PO) Take 1 capsule by mouth 2 (two) times daily.    nystatin cream (MYCOSTATIN) Apply 1 Application topically 4 (four) times daily as needed for dry skin (yeast infection).    ondansetron (ZOFRAN-ODT) 8 MG disintegrating tablet Take 8 mg by mouth every 8 (eight) hours as needed for nausea or vomiting.     oxyCODONE-acetaminophen (PERCOCET/ROXICET) 5-325 MG tablet Take 1 tablet by mouth every 6 (six) hours as needed for moderate pain or severe pain.    pantoprazole (PROTONIX) 40 MG tablet TAKE 1 TABLET(40 MG) BY MOUTH EVERY MORNING    polyethylene glycol powder (MIRALAX) 17 GM/SCOOP powder Take 17 g by mouth daily as needed for moderate constipation.    pyridoxine (B-6) 100 MG tablet Take 100 mg by mouth daily.    sennosides-docusate sodium (SENOKOT-S) 8.6-50 MG tablet Take 1 tablet by mouth daily.    sodium chloride (OCEAN) 0.65 % SOLN nasal spray Place 1 spray into both  nostrils as needed for congestion.    traMADol (ULTRAM) 50 MG tablet Take 50-100 mg by mouth every 6 (six) hours as needed.    traZODone (DESYREL) 50 MG tablet Take 50 mg by mouth at bedtime.    warfarin (COUMADIN) 1 MG tablet Take 1 mg by mouth every evening. Take with 5 mg tab to get 6 mg    warfarin (COUMADIN) 5 MG tablet Take 5 mg by mouth every evening. Take with 1 mg tab to get 6 mg    No facility-administered encounter medications on file as of 01/05/2023.    Recent vitals BP Readings from Last 3 Encounters:  12/27/22 (!) 129/55  12/14/22 (!) 104/40  12/05/22 (!) 195/77   Pulse Readings from Last 3 Encounters:  12/27/22 79  12/14/22 (!) 56  12/05/22 85   Wt Readings from Last 3 Encounters:  12/22/22 152 lb (68.9 kg)  12/14/22 154 lb (69.9 kg)  12/05/22 154 lb 11.2 oz (70.2 kg)   BMI Readings from Last 3 Encounters:  12/22/22 29.69 kg/m  12/14/22 30.08 kg/m  12/05/22 29.23 kg/m    Recent lab results    Component Value Date/Time   NA 136 12/12/2022 0824   K 4.7 12/12/2022 0824   CL 97 12/12/2022 0824   CO2 25 12/12/2022 0824   GLUCOSE 131 (H) 12/12/2022 0824   GLUCOSE 138 (H) 11/16/2022 1049   BUN 27 12/12/2022 0824   CREATININE 1.22 (H) 12/12/2022 0824   CALCIUM 9.0 12/12/2022 0824    Lab Results  Component Value Date   CREATININE 1.22 (H) 12/12/2022   GFR 57.98 (L) 03/10/2020   EGFR 45 (L) 12/12/2022   GFRNONAA 46 (L) 11/16/2022   GFRAA 70 09/11/2020   Lab Results  Component Value Date/Time   HGBA1C 6.8 (H) 12/12/2022 08:24 AM   HGBA1C 7.3 (H) 11/16/2022 10:49 AM   MICROALBUR 10 06/15/2021 04:46 PM   MICROALBUR 10 07/29/2020 12:01 PM    Lab Results  Component Value Date   CHOL 131 12/12/2022   HDL 69 12/12/2022   LDLCALC 50 12/12/2022   TRIG 54 12/12/2022   CHOLHDL 1.9 12/12/2022    Care Gaps: Annual wellness visit in last year? Yes, 02/15/22  If Diabetic: Last eye exam / retinopathy screening: 10/21/21 Last diabetic foot  exam:09/07/22 Last UACR: 09/07/22  Star Rating Drugs:  Medication:  Last Fill: Day Supply Atorvastatin  10/20/22-07/15/22 90ds Farxiga  12/13/22-09/02/22 90ds Lisinopril   10/20/22-07/15/22 90ds   Roxana Hires, CMA Clinical Pharmacist Assistant  [phone]

## 2023-01-06 ENCOUNTER — Telehealth: Payer: Self-pay

## 2023-01-06 DIAGNOSIS — I4891 Unspecified atrial fibrillation: Secondary | ICD-10-CM | POA: Diagnosis not present

## 2023-01-06 NOTE — Telephone Encounter (Signed)
Pt states, "I got home from rehab today. I just took my INR and it was 3.5. They were giving me 8 or 9 mg warfarin a day. I want to know what Pamela [name] wants me to do".  She is NO LONGER on Lovenox.

## 2023-01-06 NOTE — Telephone Encounter (Unsigned)
Opened in error

## 2023-01-09 ENCOUNTER — Telehealth: Payer: Self-pay

## 2023-01-09 NOTE — Telephone Encounter (Signed)
Needs hospital follow-up following back surgery.

## 2023-01-11 ENCOUNTER — Telehealth: Payer: Self-pay

## 2023-01-11 ENCOUNTER — Inpatient Hospital Stay: Payer: Medicare PPO | Admitting: Family Medicine

## 2023-01-11 DIAGNOSIS — D6859 Other primary thrombophilia: Secondary | ICD-10-CM | POA: Diagnosis not present

## 2023-01-11 DIAGNOSIS — Z7901 Long term (current) use of anticoagulants: Secondary | ICD-10-CM | POA: Diagnosis not present

## 2023-01-11 NOTE — Telephone Encounter (Signed)
Patient reported an INR of 3.5.

## 2023-01-16 ENCOUNTER — Telehealth: Payer: Self-pay

## 2023-01-16 DIAGNOSIS — E039 Hypothyroidism, unspecified: Secondary | ICD-10-CM | POA: Diagnosis not present

## 2023-01-16 DIAGNOSIS — I1310 Hypertensive heart and chronic kidney disease without heart failure, with stage 1 through stage 4 chronic kidney disease, or unspecified chronic kidney disease: Secondary | ICD-10-CM | POA: Diagnosis not present

## 2023-01-16 DIAGNOSIS — E1122 Type 2 diabetes mellitus with diabetic chronic kidney disease: Secondary | ICD-10-CM | POA: Diagnosis not present

## 2023-01-16 DIAGNOSIS — M549 Dorsalgia, unspecified: Secondary | ICD-10-CM | POA: Diagnosis not present

## 2023-01-16 DIAGNOSIS — I4891 Unspecified atrial fibrillation: Secondary | ICD-10-CM | POA: Diagnosis not present

## 2023-01-16 DIAGNOSIS — G8918 Other acute postprocedural pain: Secondary | ICD-10-CM | POA: Diagnosis not present

## 2023-01-16 DIAGNOSIS — Z4789 Encounter for other orthopedic aftercare: Secondary | ICD-10-CM | POA: Diagnosis not present

## 2023-01-16 DIAGNOSIS — D631 Anemia in chronic kidney disease: Secondary | ICD-10-CM | POA: Diagnosis not present

## 2023-01-16 DIAGNOSIS — N189 Chronic kidney disease, unspecified: Secondary | ICD-10-CM | POA: Diagnosis not present

## 2023-01-16 NOTE — Telephone Encounter (Signed)
Pt called to report she is on coumadin 5mg  po qd. Her INR today is 3.3

## 2023-01-17 DIAGNOSIS — M549 Dorsalgia, unspecified: Secondary | ICD-10-CM | POA: Diagnosis not present

## 2023-01-17 DIAGNOSIS — I1310 Hypertensive heart and chronic kidney disease without heart failure, with stage 1 through stage 4 chronic kidney disease, or unspecified chronic kidney disease: Secondary | ICD-10-CM | POA: Diagnosis not present

## 2023-01-17 DIAGNOSIS — D631 Anemia in chronic kidney disease: Secondary | ICD-10-CM | POA: Diagnosis not present

## 2023-01-17 DIAGNOSIS — Z4789 Encounter for other orthopedic aftercare: Secondary | ICD-10-CM | POA: Diagnosis not present

## 2023-01-17 DIAGNOSIS — I4891 Unspecified atrial fibrillation: Secondary | ICD-10-CM | POA: Diagnosis not present

## 2023-01-17 DIAGNOSIS — G8918 Other acute postprocedural pain: Secondary | ICD-10-CM | POA: Diagnosis not present

## 2023-01-17 DIAGNOSIS — E039 Hypothyroidism, unspecified: Secondary | ICD-10-CM | POA: Diagnosis not present

## 2023-01-17 DIAGNOSIS — E1122 Type 2 diabetes mellitus with diabetic chronic kidney disease: Secondary | ICD-10-CM | POA: Diagnosis not present

## 2023-01-17 DIAGNOSIS — N189 Chronic kidney disease, unspecified: Secondary | ICD-10-CM | POA: Diagnosis not present

## 2023-01-18 DIAGNOSIS — I1310 Hypertensive heart and chronic kidney disease without heart failure, with stage 1 through stage 4 chronic kidney disease, or unspecified chronic kidney disease: Secondary | ICD-10-CM | POA: Diagnosis not present

## 2023-01-18 DIAGNOSIS — E039 Hypothyroidism, unspecified: Secondary | ICD-10-CM | POA: Diagnosis not present

## 2023-01-18 DIAGNOSIS — I4891 Unspecified atrial fibrillation: Secondary | ICD-10-CM | POA: Diagnosis not present

## 2023-01-18 DIAGNOSIS — M549 Dorsalgia, unspecified: Secondary | ICD-10-CM | POA: Diagnosis not present

## 2023-01-18 DIAGNOSIS — Z4789 Encounter for other orthopedic aftercare: Secondary | ICD-10-CM | POA: Diagnosis not present

## 2023-01-18 DIAGNOSIS — E1122 Type 2 diabetes mellitus with diabetic chronic kidney disease: Secondary | ICD-10-CM | POA: Diagnosis not present

## 2023-01-18 DIAGNOSIS — N189 Chronic kidney disease, unspecified: Secondary | ICD-10-CM | POA: Diagnosis not present

## 2023-01-18 DIAGNOSIS — G8918 Other acute postprocedural pain: Secondary | ICD-10-CM | POA: Diagnosis not present

## 2023-01-18 DIAGNOSIS — D631 Anemia in chronic kidney disease: Secondary | ICD-10-CM | POA: Diagnosis not present

## 2023-01-23 ENCOUNTER — Encounter: Payer: Self-pay | Admitting: Hematology and Oncology

## 2023-01-25 ENCOUNTER — Telehealth: Payer: Self-pay

## 2023-01-25 NOTE — Telephone Encounter (Signed)
Pt reports she has developed bumps underneath her skin. The bumps are more prominent in the mornings. She describes as a burning pain, tender to touch on left leg (just above knee extending to just below calf). Bumps are not red. She denies injury.

## 2023-01-25 NOTE — Telephone Encounter (Signed)
-----   Message from Adah Perl, PA-C sent at 01/25/2023  8:21 AM EDT ----- Regarding: RE: INR Please have her continue warfarin 5 mg daily and repeat INR in 1 week. Thanks ----- Message ----- From: Hipolito Bayley, RN Sent: 01/23/2023   1:59 PM EDT To: Adah Perl, PA-C Subject: RE: INR                                         ----- Message ----- From: Dyane Dustman, RN Sent: 01/23/2023   1:24 PM EDT To: Hipolito Bayley, RN Subject: INR                                            INR today 2.3

## 2023-01-26 ENCOUNTER — Telehealth: Payer: Self-pay

## 2023-01-26 ENCOUNTER — Emergency Department (HOSPITAL_BASED_OUTPATIENT_CLINIC_OR_DEPARTMENT_OTHER)
Admission: EM | Admit: 2023-01-26 | Discharge: 2023-01-26 | Disposition: A | Discharge status: Home / Self Care | Payer: Medicare PPO | Attending: Emergency Medicine | Admitting: Emergency Medicine

## 2023-01-26 ENCOUNTER — Other Ambulatory Visit: Payer: Self-pay

## 2023-01-26 ENCOUNTER — Encounter (HOSPITAL_BASED_OUTPATIENT_CLINIC_OR_DEPARTMENT_OTHER): Payer: Self-pay | Admitting: Pediatrics

## 2023-01-26 ENCOUNTER — Telehealth: Payer: Self-pay | Admitting: Family Medicine

## 2023-01-26 ENCOUNTER — Emergency Department (HOSPITAL_BASED_OUTPATIENT_CLINIC_OR_DEPARTMENT_OTHER): Payer: Medicare PPO

## 2023-01-26 DIAGNOSIS — Z7901 Long term (current) use of anticoagulants: Secondary | ICD-10-CM | POA: Diagnosis not present

## 2023-01-26 DIAGNOSIS — G8918 Other acute postprocedural pain: Secondary | ICD-10-CM | POA: Diagnosis not present

## 2023-01-26 DIAGNOSIS — M79605 Pain in left leg: Secondary | ICD-10-CM | POA: Diagnosis not present

## 2023-01-26 DIAGNOSIS — Z7982 Long term (current) use of aspirin: Secondary | ICD-10-CM | POA: Insufficient documentation

## 2023-01-26 DIAGNOSIS — D631 Anemia in chronic kidney disease: Secondary | ICD-10-CM | POA: Diagnosis not present

## 2023-01-26 DIAGNOSIS — E039 Hypothyroidism, unspecified: Secondary | ICD-10-CM | POA: Diagnosis not present

## 2023-01-26 DIAGNOSIS — M549 Dorsalgia, unspecified: Secondary | ICD-10-CM | POA: Diagnosis not present

## 2023-01-26 DIAGNOSIS — N189 Chronic kidney disease, unspecified: Secondary | ICD-10-CM | POA: Diagnosis not present

## 2023-01-26 DIAGNOSIS — E1122 Type 2 diabetes mellitus with diabetic chronic kidney disease: Secondary | ICD-10-CM | POA: Diagnosis not present

## 2023-01-26 DIAGNOSIS — I4891 Unspecified atrial fibrillation: Secondary | ICD-10-CM | POA: Diagnosis not present

## 2023-01-26 DIAGNOSIS — Z4789 Encounter for other orthopedic aftercare: Secondary | ICD-10-CM | POA: Diagnosis not present

## 2023-01-26 DIAGNOSIS — I1310 Hypertensive heart and chronic kidney disease without heart failure, with stage 1 through stage 4 chronic kidney disease, or unspecified chronic kidney disease: Secondary | ICD-10-CM | POA: Diagnosis not present

## 2023-01-26 NOTE — Discharge Instructions (Addendum)
Your DVT study was negative today, please follow up with your primary care physician as needed.

## 2023-01-26 NOTE — Telephone Encounter (Signed)
[facility] ORDER  01/16/23 TO 03/16/23

## 2023-01-26 NOTE — Telephone Encounter (Signed)
Patient called stating that 2 weeks ago she had back surgery and now has been released home to do at home physical therapy. She noticed about a week ago she has a hematoma above her knee and she states below the knee she has knots and there are some on her calf too as well and states that they are painful to touch. Consulted with Dr. Sedalia Muta and per provider since we have no available openings the rest of the week the patient would need to [facility] to be [facility] for possible blood clot considering the areas are painful to the touch. Patient was informed and stated she would tell her husband and go from there.

## 2023-01-26 NOTE — ED Provider Notes (Signed)
[place] [facility] Provider Note   CSN: [id number] Arrival date & time: 01/26/23  1447     History  Chief Complaint  Patient presents with   Knee Pain    Pamela [name] is a 77 y.o. female.  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  She reports her symptoms began shortly after her surgery, she did have to switch her warfarin and bridging with Lovenox and now has gone back on warfarin.  She reports pain with palpation throughout the entire left leg however no increase in swelling.  She tried to make an appointment with her primary care physician today however they had no appointments until later next week.  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  She denies any trauma, no fever, no other complaints.  The history is provided by the patient.  Knee Pain Location:  Leg Time since incident:  1 week Injury: no   Leg location:  L lower leg and L upper leg Pain details:    Radiates to:  Does not radiate   Severity:  Mild   Onset quality:  Sudden   Duration:  1 week   Timing:  Constant Associated symptoms: no fever        Home Medications Prior to Admission medications   Medication Sig Start Date End Date Taking? Authorizing Provider  acetaminophen (TYLENOL) 650 MG CR tablet Take 650-1,300 mg by mouth every 8 (eight) hours as needed for pain.    [provider]  albuterol (VENTOLIN HFA) 108 (90 Base) MCG/ACT inhaler INHALE 1 TO 2 PUFFS INTO THE LUNGS EVERY 6 HOURS AS NEEDED FOR WHEEZING OR SHORTNESS OF BREATH 02/04/22   Marianne Sofia, PA-C  amLODipine (NORVASC) 5 MG tablet TAKE 1 TABLET(5 MG) BY MOUTH DAILY 07/15/22   Tobb, Kardie, DO  aspirin EC (ASPIRIN LOW DOSE) 81 MG tablet TAKE 1 TABLET(81 MG) BY MOUTH DAILY 08/02/22   Tobb, Kardie, DO  atorvastatin (LIPITOR) 80 MG  tablet TAKE 1 TABLET(80 MG) BY MOUTH DAILY 07/15/22   Tobb, Kardie, DO  Calcium Citrate-Vitamin D (CALCIUM CITRATE + PO) Take 1 tablet by mouth daily.    [provider]  carboxymethylcellulose (REFRESH PLUS) 0.5 % SOLN Place 1 drop into both eyes 3 (three) times daily as needed (dry eyes).    [provider]  Cholecalciferol (VITAMIN D3) 50 MCG (2000 UT) TABS Take 4,000 Units by mouth daily with lunch.    [provider]  CINNAMON PO Take 1,000 mg by mouth daily.    [provider]  clindamycin (CLEOCIN) 300 MG capsule Take 300 mg by mouth See admin instructions. 1 hour before dental procedures    [provider]  Continuous Blood Gluc Sensor (FREESTYLE LIBRE 2 SENSOR) MISC 2 each by Does not apply route every 14 (fourteen) days. E11.40 11/20/20   CoxFritzi Mandes, MD  cyanocobalamin (VITAMIN B12) 1000 MCG tablet Take 1,000 mcg by mouth daily.    [provider]  dapagliflozin propanediol (FARXIGA) 10 MG TABS tablet Take 1 tablet (10 mg total) by mouth daily before breakfast. 05/27/22   Shamleffer, Konrad Dolores, MD  denosumab (PROLIA) 60 MG/ML SOSY injection Inject 60 mg into the skin every 6 (six) months.    [provider]  estradiol (ESTRACE) 0.1 MG/GM vaginal cream Place 1  Applicatorful vaginally 2 (two) times a week. As needed 06/10/20   [provider]  ferrous sulfate 325 (65 FE) MG tablet Take 325 mg by mouth daily with lunch.    [provider]  furosemide (LASIX) 40 MG tablet Take 1 tablet (40 mg total) by mouth daily. 07/15/22   Tobb, Kardie, DO  GEMTESA 75 MG TABS Take 75 mg by mouth at bedtime. 11/09/21   [provider]  Insulin Aspart FlexPen (NOVOLOG) 100 UNIT/ML Max daily 30 units Patient taking differently: Inject 4-5 Units into the skin 3 (three) times daily with meals. Max daily 30 units 03/30/22   Shamleffer, Konrad Dolores, MD  insulin glargine, 1 Unit Dial, (TOUJEO) 300 UNIT/ML Solostar Pen  Inject 8 Units into the skin at bedtime. Patient taking differently: Inject 6 Units into the skin at bedtime. 05/27/22   Shamleffer, Konrad Dolores, MD  Insulin Pen Needle (BD PEN NEEDLE NANO 2ND GEN) 32G X 4 MM MISC USE DAILY AS DIRECTED 08/16/22   Cox, Kirsten, MD  ipratropium-albuterol (DUONEB) 0.5-2.5 (3) MG/3ML SOLN Take 3 mLs by nebulization 4 (four) times daily as needed (wheezing/shortness of breath). 11/07/17   [provider]  isosorbide mononitrate (IMDUR) 30 MG 24 hr tablet 1 tablet by mouth daily, 2nd attempt, please reach arrange appt for further refills 07/15/22   Tobb, Kardie, DO  levothyroxine (SYNTHROID) 25 MCG tablet Take 1 tablet (25 mcg total) by mouth daily before breakfast. 12/16/22   Cox, Kirsten, MD  lisinopril (ZESTRIL) 20 MG tablet TAKE 2 TABLETS(40 MG) BY MOUTH TWICE DAILY 07/15/22   Tobb, Kardie, DO  magnesium oxide (MAG-OX) 400 (240 Mg) MG tablet TAKE 2 TABLETS(800 MG) BY MOUTH TWICE DAILY Patient taking differently: Take 800 mg by mouth 2 (two) times daily. 02/15/21   [name], MD  methocarbamol (ROBAXIN) 500 MG tablet Take 1 tablet (500 mg total) by mouth every 6 (six) hours as needed for muscle spasms. 12/27/22   Barnett Abu, MD  methocarbamol (ROBAXIN) 750 MG tablet Take 750 mg by mouth at bedtime.    [provider]  Multiple Vitamins-Minerals (PRESERVISION AREDS PO) Take 1 capsule by mouth 2 (two) times daily.    [provider]  nystatin cream (MYCOSTATIN) Apply 1 Application topically 4 (four) times daily as needed for dry skin (yeast infection).    [provider]  ondansetron (ZOFRAN-ODT) 8 MG disintegrating tablet Take 8 mg by mouth every 8 (eight) hours as needed for nausea or vomiting.     [provider]  oxyCODONE-acetaminophen (PERCOCET/ROXICET) 5-325 MG tablet Take 1 tablet by mouth every 6 (six) hours as needed for moderate pain or severe pain. 12/27/22   Barnett Abu, MD  pantoprazole (PROTONIX) 40  MG tablet TAKE 1 TABLET(40 MG) BY MOUTH EVERY MORNING 05/15/22   Cox, Kirsten, MD  polyethylene glycol powder (MIRALAX) 17 GM/SCOOP powder Take 17 g by mouth daily as needed for moderate constipation.    [provider]  pyridoxine (B-6) 100 MG tablet Take 100 mg by mouth daily.    [provider]  sennosides-docusate sodium (SENOKOT-S) 8.6-50 MG tablet Take 1 tablet by mouth daily.    [provider]  sodium chloride (OCEAN) 0.65 % SOLN nasal spray Place 1 spray into both nostrils as needed for congestion.    [provider]  traMADol (ULTRAM) 50 MG tablet Take 50-100 mg by mouth every 6 (six) hours as needed.    [provider]  traZODone (DESYREL) 50 MG tablet  Take 50 mg by mouth at bedtime.    [provider]  warfarin (COUMADIN) 1 MG tablet Take 1 mg by mouth every evening. Take with 5 mg tab to get 6 mg    [provider]  warfarin (COUMADIN) 5 MG tablet Take 5 mg by mouth every evening. Take with 1 mg tab to get 6 mg 12/06/22   [provider]      Allergies    Ketek [telithromycin], Loxapine succinate, Naproxen sodium, Amoxapine and related, Darvon, Belsomra [suvorexant], Cymbalta [duloxetine hcl], Duloxetine, Gabapentin, Lyrica [pregabalin], Ozempic (0.25 or 0.5 mg-dose) [semaglutide(0.25 or 0.5mg -dos)], Semaglutide, Amoxicillin, and Propoxyphene    Review of Systems   Review of Systems  Constitutional:  Negative for chills and fever.  Respiratory:  Negative for shortness of breath.   Cardiovascular:  Positive for leg swelling. Negative for chest pain.  Musculoskeletal:  Positive for myalgias.  All other systems reviewed and are negative.   Physical Exam Updated Vital Signs BP (!) 158/70   Pulse 82   Temp 97.7 F (36.5 C) (Oral)   Resp 18   Ht 5' (1.524 m)   Wt 68.5 kg   SpO2 93%   BMI 29.49 kg/m  Physical Exam Vitals and nursing note reviewed.  Constitutional:      Appearance: Normal appearance.   HENT:     Head: Normocephalic and atraumatic.     Mouth/Throat:     Mouth: Mucous membranes are moist.  Cardiovascular:     Rate and Rhythm: Normal rate.     Pulses:          Dorsalis pedis pulses are 2+ on the right side and 2+ on the left side.  Pulmonary:     Effort: Pulmonary effort is normal.  Abdominal:     General: Abdomen is flat.  Musculoskeletal:     Cervical back: Normal range of motion and neck supple.     Left upper leg: Tenderness present.       Legs:     Comments: Palpable knots throughout the entire leg, no fluctuance, no streaking in the skin.   Skin:    General: Skin is warm and dry.  Neurological:     Mental Status: She is alert and oriented to person, place, and time.     ED Results / Procedures / Treatments   Labs (all labs ordered are listed, but only abnormal results are displayed) Labs Reviewed - No data to display  EKG None  Radiology US Venous Img Lower Unilateral Left  Result Date: 01/26/2023 CLINICAL DATA:  Knot, pain, hematoma and swelling EXAM: LEFT LOWER EXTREMITY VENOUS DOPPLER ULTRASOUND TECHNIQUE: Gray-scale sonography with compression, as well as color and duplex ultrasound, were performed to evaluate the deep venous system(s) from the level of the common femoral vein through the popliteal and proximal calf veins. COMPARISON:  None Available. FINDINGS: VENOUS Normal compressibility of the common femoral, superficial femoral, and popliteal veins, as well as the visualized calf veins. Visualized portions of profunda femoral vein and great saphenous vein unremarkable. No filling defects to suggest DVT on grayscale or color Doppler imaging. Doppler waveforms show normal direction of venous flow, normal respiratory plasticity and response to augmentation. Limited views of the contralateral common femoral vein are unremarkable. OTHER None. Limitations: none IMPRESSION: Negative ultrasound examination for deep venous thrombosis in the left lower  extremity. Electronically Signed   By: Jearld Lesch M.D.   On: 01/26/2023 17:01    Procedures Procedures    Medications  Ordered in ED Medications - No data to display  ED Course/ Medical Decision Making/ A&P                             Medical Decision Making      This patient presents to the ED for concern of left leg pain, this involves a number of treatment options, and is a complaint that carries with it a high risk of complications and morbidity.  The differential diagnosis includes DVT, cellulitis versus trauma.    Co morbidities: Discussed in HPI   Brief History:  See HPI.   EMR reviewed including pt PMHx, past surgical history and past visits to ER.   See HPI for more details   Lab Tests:  I ordered and independently interpreted labs.  The pertinent results include:    N/A   Imaging Studies:  US DVT: IMPRESSION:  Negative ultrasound examination for deep venous thrombosis in the  left lower extremity.    Reevaluation:  After the interventions noted above I re-evaluated patient and found that they have :stayed the same  Social Determinants of Health:  The patient's social determinants of health were a factor in the care of this patient    Problem List / ED Course:  Patient here anticoagulated on warfarin, status post back surgery on April 18 presents to the ED with left leg pain, and knots that she feels throughout her entire leg.  She also reports similar knots on her abdomen where she was given herself injections of Lovenox when she was bridging her therapy.  She does have a prior history of DVTs.  Has not had any since anticoagulated.  She does have an underlying history of protein S deficiency, has had pain along her left calf over the past week, try to see her PCP who could not see her this week.  Arrived to the ED hemodynamically stable, no chest pain, no shortness of breath, no hypoxia to suggest PE.  On evaluation left leg is well-appearing,  does not appear swollen, no induration, no changes in the skin to suggest infection.  There is palpable pain along the left calf.  She is neurovascularly intact.  Ultrasound of the left leg did not show any acute DVT. Discussed case with Dr. Doran Durand who has seen patient and agrees with plan and management.  Dispostion:  After consideration of the diagnostic results and the patients response to treatment, I feel that the patent would benefit from follow up with PCP.   Portions of this note were generated with Scientist, clinical (histocompatibility and immunogenetics). Dictation errors may occur despite best attempts at proofreading.   Final Clinical Impression(s) / ED Diagnoses Final diagnoses:  Left leg pain    Rx / DC Orders ED Discharge Orders     None         Claude Manges, PA-C 01/26/23 1816    Glyn Ade, MD 02/03/23 [phone]

## 2023-01-26 NOTE — ED Notes (Signed)
Patient given discharge instructions. Questions were answered. Patient verbalized understanding of discharge instructions and care at home.  

## 2023-01-26 NOTE — ED Triage Notes (Signed)
C/O pain in left leg. Endorsed some "knots" all over the left leg as well as a couple of hematoma. Denies any recent injury. Endorsed spoke with PCP and was instructed to come to ED to rule out DVT.

## 2023-01-27 DIAGNOSIS — Z4789 Encounter for other orthopedic aftercare: Secondary | ICD-10-CM | POA: Diagnosis not present

## 2023-01-27 DIAGNOSIS — G8918 Other acute postprocedural pain: Secondary | ICD-10-CM | POA: Diagnosis not present

## 2023-01-27 DIAGNOSIS — I1310 Hypertensive heart and chronic kidney disease without heart failure, with stage 1 through stage 4 chronic kidney disease, or unspecified chronic kidney disease: Secondary | ICD-10-CM | POA: Diagnosis not present

## 2023-01-27 DIAGNOSIS — E1122 Type 2 diabetes mellitus with diabetic chronic kidney disease: Secondary | ICD-10-CM | POA: Diagnosis not present

## 2023-01-27 DIAGNOSIS — I4891 Unspecified atrial fibrillation: Secondary | ICD-10-CM | POA: Diagnosis not present

## 2023-01-27 DIAGNOSIS — M549 Dorsalgia, unspecified: Secondary | ICD-10-CM | POA: Diagnosis not present

## 2023-01-27 DIAGNOSIS — E039 Hypothyroidism, unspecified: Secondary | ICD-10-CM | POA: Diagnosis not present

## 2023-01-27 DIAGNOSIS — D631 Anemia in chronic kidney disease: Secondary | ICD-10-CM | POA: Diagnosis not present

## 2023-01-27 DIAGNOSIS — N189 Chronic kidney disease, unspecified: Secondary | ICD-10-CM | POA: Diagnosis not present

## 2023-01-30 LAB — POCT INR

## 2023-01-31 ENCOUNTER — Telehealth: Payer: Self-pay | Admitting: Hematology and Oncology

## 2023-01-31 DIAGNOSIS — N189 Chronic kidney disease, unspecified: Secondary | ICD-10-CM | POA: Diagnosis not present

## 2023-01-31 DIAGNOSIS — M549 Dorsalgia, unspecified: Secondary | ICD-10-CM | POA: Diagnosis not present

## 2023-01-31 DIAGNOSIS — E039 Hypothyroidism, unspecified: Secondary | ICD-10-CM | POA: Diagnosis not present

## 2023-01-31 DIAGNOSIS — E1122 Type 2 diabetes mellitus with diabetic chronic kidney disease: Secondary | ICD-10-CM | POA: Diagnosis not present

## 2023-01-31 DIAGNOSIS — Z4789 Encounter for other orthopedic aftercare: Secondary | ICD-10-CM | POA: Diagnosis not present

## 2023-01-31 DIAGNOSIS — I1310 Hypertensive heart and chronic kidney disease without heart failure, with stage 1 through stage 4 chronic kidney disease, or unspecified chronic kidney disease: Secondary | ICD-10-CM | POA: Diagnosis not present

## 2023-01-31 DIAGNOSIS — I4891 Unspecified atrial fibrillation: Secondary | ICD-10-CM | POA: Diagnosis not present

## 2023-01-31 DIAGNOSIS — G8918 Other acute postprocedural pain: Secondary | ICD-10-CM | POA: Diagnosis not present

## 2023-01-31 DIAGNOSIS — D631 Anemia in chronic kidney disease: Secondary | ICD-10-CM | POA: Diagnosis not present

## 2023-01-31 NOTE — Telephone Encounter (Signed)
Spoke with patient regarding home INR of 2.2 on warfarin 5 mg daily. Although therapeutic, this has dropped from 3.3 in 1 week.  I therefore will have her take 6 mg of warfarin on Tuesdays and Saturdays and continue 5 mg daily.  She knows to repeat a home INR in 1 week.  The patient expressed understanding.

## 2023-02-01 DIAGNOSIS — K219 Gastro-esophageal reflux disease without esophagitis: Secondary | ICD-10-CM

## 2023-02-01 DIAGNOSIS — D631 Anemia in chronic kidney disease: Secondary | ICD-10-CM | POA: Diagnosis not present

## 2023-02-01 DIAGNOSIS — E785 Hyperlipidemia, unspecified: Secondary | ICD-10-CM

## 2023-02-01 DIAGNOSIS — I4891 Unspecified atrial fibrillation: Secondary | ICD-10-CM | POA: Diagnosis not present

## 2023-02-01 DIAGNOSIS — N189 Chronic kidney disease, unspecified: Secondary | ICD-10-CM | POA: Diagnosis not present

## 2023-02-01 DIAGNOSIS — M549 Dorsalgia, unspecified: Secondary | ICD-10-CM | POA: Diagnosis not present

## 2023-02-01 DIAGNOSIS — I1310 Hypertensive heart and chronic kidney disease without heart failure, with stage 1 through stage 4 chronic kidney disease, or unspecified chronic kidney disease: Secondary | ICD-10-CM | POA: Diagnosis not present

## 2023-02-01 DIAGNOSIS — E039 Hypothyroidism, unspecified: Secondary | ICD-10-CM | POA: Diagnosis not present

## 2023-02-01 DIAGNOSIS — G8918 Other acute postprocedural pain: Secondary | ICD-10-CM | POA: Diagnosis not present

## 2023-02-01 DIAGNOSIS — Z4789 Encounter for other orthopedic aftercare: Secondary | ICD-10-CM | POA: Diagnosis not present

## 2023-02-01 DIAGNOSIS — E1122 Type 2 diabetes mellitus with diabetic chronic kidney disease: Secondary | ICD-10-CM | POA: Diagnosis not present

## 2023-02-01 DIAGNOSIS — M810 Age-related osteoporosis without current pathological fracture: Secondary | ICD-10-CM

## 2023-02-06 ENCOUNTER — Ambulatory Visit: Payer: Medicare PPO | Admitting: Oncology

## 2023-02-06 ENCOUNTER — Other Ambulatory Visit: Payer: Medicare PPO

## 2023-02-06 DIAGNOSIS — G8918 Other acute postprocedural pain: Secondary | ICD-10-CM | POA: Diagnosis not present

## 2023-02-06 DIAGNOSIS — I4891 Unspecified atrial fibrillation: Secondary | ICD-10-CM | POA: Diagnosis not present

## 2023-02-06 DIAGNOSIS — E039 Hypothyroidism, unspecified: Secondary | ICD-10-CM | POA: Diagnosis not present

## 2023-02-06 DIAGNOSIS — N189 Chronic kidney disease, unspecified: Secondary | ICD-10-CM | POA: Diagnosis not present

## 2023-02-06 DIAGNOSIS — D631 Anemia in chronic kidney disease: Secondary | ICD-10-CM | POA: Diagnosis not present

## 2023-02-06 DIAGNOSIS — M549 Dorsalgia, unspecified: Secondary | ICD-10-CM | POA: Diagnosis not present

## 2023-02-06 DIAGNOSIS — Z4789 Encounter for other orthopedic aftercare: Secondary | ICD-10-CM | POA: Diagnosis not present

## 2023-02-06 DIAGNOSIS — E1122 Type 2 diabetes mellitus with diabetic chronic kidney disease: Secondary | ICD-10-CM | POA: Diagnosis not present

## 2023-02-06 DIAGNOSIS — I1310 Hypertensive heart and chronic kidney disease without heart failure, with stage 1 through stage 4 chronic kidney disease, or unspecified chronic kidney disease: Secondary | ICD-10-CM | POA: Diagnosis not present

## 2023-02-07 ENCOUNTER — Telehealth: Payer: Self-pay

## 2023-02-07 DIAGNOSIS — I4891 Unspecified atrial fibrillation: Secondary | ICD-10-CM | POA: Diagnosis not present

## 2023-02-07 DIAGNOSIS — M549 Dorsalgia, unspecified: Secondary | ICD-10-CM | POA: Diagnosis not present

## 2023-02-07 DIAGNOSIS — I1310 Hypertensive heart and chronic kidney disease without heart failure, with stage 1 through stage 4 chronic kidney disease, or unspecified chronic kidney disease: Secondary | ICD-10-CM | POA: Diagnosis not present

## 2023-02-07 DIAGNOSIS — G8918 Other acute postprocedural pain: Secondary | ICD-10-CM | POA: Diagnosis not present

## 2023-02-07 DIAGNOSIS — Z4789 Encounter for other orthopedic aftercare: Secondary | ICD-10-CM | POA: Diagnosis not present

## 2023-02-07 DIAGNOSIS — E1122 Type 2 diabetes mellitus with diabetic chronic kidney disease: Secondary | ICD-10-CM | POA: Diagnosis not present

## 2023-02-07 DIAGNOSIS — E039 Hypothyroidism, unspecified: Secondary | ICD-10-CM | POA: Diagnosis not present

## 2023-02-07 DIAGNOSIS — D631 Anemia in chronic kidney disease: Secondary | ICD-10-CM | POA: Diagnosis not present

## 2023-02-07 DIAGNOSIS — N189 Chronic kidney disease, unspecified: Secondary | ICD-10-CM | POA: Diagnosis not present

## 2023-02-07 NOTE — Telephone Encounter (Signed)
Confirmed with pt, that she is taking coumadin 6mg  qd on Tue & Sat, 5mg  qd all other days. Please advise.

## 2023-02-08 DIAGNOSIS — I4891 Unspecified atrial fibrillation: Secondary | ICD-10-CM | POA: Diagnosis not present

## 2023-02-08 DIAGNOSIS — Z4789 Encounter for other orthopedic aftercare: Secondary | ICD-10-CM | POA: Diagnosis not present

## 2023-02-08 DIAGNOSIS — E1122 Type 2 diabetes mellitus with diabetic chronic kidney disease: Secondary | ICD-10-CM | POA: Diagnosis not present

## 2023-02-08 DIAGNOSIS — I1310 Hypertensive heart and chronic kidney disease without heart failure, with stage 1 through stage 4 chronic kidney disease, or unspecified chronic kidney disease: Secondary | ICD-10-CM | POA: Diagnosis not present

## 2023-02-08 DIAGNOSIS — G8918 Other acute postprocedural pain: Secondary | ICD-10-CM | POA: Diagnosis not present

## 2023-02-08 DIAGNOSIS — M549 Dorsalgia, unspecified: Secondary | ICD-10-CM | POA: Diagnosis not present

## 2023-02-08 DIAGNOSIS — N189 Chronic kidney disease, unspecified: Secondary | ICD-10-CM | POA: Diagnosis not present

## 2023-02-08 DIAGNOSIS — D631 Anemia in chronic kidney disease: Secondary | ICD-10-CM | POA: Diagnosis not present

## 2023-02-08 DIAGNOSIS — E039 Hypothyroidism, unspecified: Secondary | ICD-10-CM | POA: Diagnosis not present

## 2023-02-10 ENCOUNTER — Other Ambulatory Visit: Payer: Self-pay | Admitting: Family Medicine

## 2023-02-13 ENCOUNTER — Telehealth: Payer: Self-pay

## 2023-02-13 NOTE — Telephone Encounter (Signed)
Pt called to report her INR results (2.9). She is taking coumadin 6mg  on Sat; 5mg  all other days.

## 2023-02-15 DIAGNOSIS — G8918 Other acute postprocedural pain: Secondary | ICD-10-CM | POA: Diagnosis not present

## 2023-02-15 DIAGNOSIS — I1310 Hypertensive heart and chronic kidney disease without heart failure, with stage 1 through stage 4 chronic kidney disease, or unspecified chronic kidney disease: Secondary | ICD-10-CM | POA: Diagnosis not present

## 2023-02-15 DIAGNOSIS — E1122 Type 2 diabetes mellitus with diabetic chronic kidney disease: Secondary | ICD-10-CM | POA: Diagnosis not present

## 2023-02-15 DIAGNOSIS — D631 Anemia in chronic kidney disease: Secondary | ICD-10-CM | POA: Diagnosis not present

## 2023-02-15 DIAGNOSIS — I4891 Unspecified atrial fibrillation: Secondary | ICD-10-CM | POA: Diagnosis not present

## 2023-02-15 DIAGNOSIS — E039 Hypothyroidism, unspecified: Secondary | ICD-10-CM | POA: Diagnosis not present

## 2023-02-15 DIAGNOSIS — Z4789 Encounter for other orthopedic aftercare: Secondary | ICD-10-CM | POA: Diagnosis not present

## 2023-02-15 DIAGNOSIS — M549 Dorsalgia, unspecified: Secondary | ICD-10-CM | POA: Diagnosis not present

## 2023-02-15 DIAGNOSIS — N189 Chronic kidney disease, unspecified: Secondary | ICD-10-CM | POA: Diagnosis not present

## 2023-02-20 ENCOUNTER — Telehealth: Payer: Self-pay

## 2023-02-20 NOTE — Telephone Encounter (Signed)
Pt called to make [place] aware that her INR is 7.1 today. I told her to HOLD today's dose for sure & we will your further instruction in the am. I asked pt what she had been eating? She reported "just vegetables like usual". She is currently on coumadin 5mg  po every day, other than Coumadin 6mg  on Sat.

## 2023-02-22 ENCOUNTER — Telehealth: Payer: Self-pay | Admitting: Hematology and Oncology

## 2023-02-22 ENCOUNTER — Telehealth: Payer: Self-pay

## 2023-02-22 DIAGNOSIS — Z7901 Long term (current) use of anticoagulants: Secondary | ICD-10-CM | POA: Diagnosis not present

## 2023-02-22 DIAGNOSIS — D6859 Other primary thrombophilia: Secondary | ICD-10-CM | POA: Diagnosis not present

## 2023-02-22 NOTE — Telephone Encounter (Signed)
Spoke with patient regarding INR 2.4 today after holding warfarin for 3 days. No obvious reason for INR of 7.1 earlier this week, no med change, no accidental dose, no diet change. Will resume warfarin 5 mg daily and repeat on 6/24.

## 2023-02-22 NOTE — Telephone Encounter (Signed)
Pt's INR is 2.4 this morning, after holding coumadin for 2 days per Dr Melvyn Neth order (her INR Monday was 7.1). She had been taking Coumadin 5mg  po every day, except 6mg  on Sat. Please advise.

## 2023-02-23 DIAGNOSIS — I4891 Unspecified atrial fibrillation: Secondary | ICD-10-CM | POA: Diagnosis not present

## 2023-02-23 DIAGNOSIS — M549 Dorsalgia, unspecified: Secondary | ICD-10-CM | POA: Diagnosis not present

## 2023-02-23 DIAGNOSIS — E1122 Type 2 diabetes mellitus with diabetic chronic kidney disease: Secondary | ICD-10-CM | POA: Diagnosis not present

## 2023-02-23 DIAGNOSIS — G8918 Other acute postprocedural pain: Secondary | ICD-10-CM | POA: Diagnosis not present

## 2023-02-23 DIAGNOSIS — Z4789 Encounter for other orthopedic aftercare: Secondary | ICD-10-CM | POA: Diagnosis not present

## 2023-02-23 DIAGNOSIS — E039 Hypothyroidism, unspecified: Secondary | ICD-10-CM | POA: Diagnosis not present

## 2023-02-23 DIAGNOSIS — I1310 Hypertensive heart and chronic kidney disease without heart failure, with stage 1 through stage 4 chronic kidney disease, or unspecified chronic kidney disease: Secondary | ICD-10-CM | POA: Diagnosis not present

## 2023-02-23 DIAGNOSIS — N189 Chronic kidney disease, unspecified: Secondary | ICD-10-CM | POA: Diagnosis not present

## 2023-02-23 DIAGNOSIS — D631 Anemia in chronic kidney disease: Secondary | ICD-10-CM | POA: Diagnosis not present

## 2023-02-27 ENCOUNTER — Telehealth: Payer: Self-pay

## 2023-02-27 NOTE — Telephone Encounter (Signed)
Pt still on Coumadin 6mg  on Sat, 5mg  all other days.

## 2023-03-07 ENCOUNTER — Telehealth: Payer: Self-pay

## 2023-03-07 NOTE — Telephone Encounter (Signed)
Spoke with patient re home INR of 3.9. She denies bleeding. She denies changes in her medications. Will decrease warfarin to 3 mg T,Th,Sun and 4 mg rest of days. Repeat INR in 1 week. Patient expresses understanding.

## 2023-03-07 NOTE — Telephone Encounter (Signed)
Pt LVM on nurse line reporting her INR of 3.9. She asked that when I call back, if she doesn't answer.Marland KitchenMarland KitchenPlease leave message.

## 2023-03-13 ENCOUNTER — Telehealth: Payer: Self-pay

## 2023-03-13 ENCOUNTER — Other Ambulatory Visit: Payer: Self-pay

## 2023-03-13 DIAGNOSIS — E1165 Type 2 diabetes mellitus with hyperglycemia: Secondary | ICD-10-CM | POA: Diagnosis not present

## 2023-03-13 DIAGNOSIS — E1122 Type 2 diabetes mellitus with diabetic chronic kidney disease: Secondary | ICD-10-CM | POA: Diagnosis not present

## 2023-03-13 DIAGNOSIS — D6859 Other primary thrombophilia: Secondary | ICD-10-CM

## 2023-03-13 MED ORDER — WARFARIN SODIUM 4 MG PO TABS
4.0000 mg | ORAL_TABLET | Freq: Every day | ORAL | 1 refills | Status: DC
Start: 2023-03-13 — End: 2023-09-14

## 2023-03-13 MED ORDER — WARFARIN SODIUM 3 MG PO TABS
3.0000 mg | ORAL_TABLET | Freq: Every day | ORAL | 1 refills | Status: DC
Start: 2023-03-13 — End: 2023-09-26

## 2023-03-13 NOTE — Telephone Encounter (Signed)
Pt called to report her INR is 2.6 today.

## 2023-03-15 ENCOUNTER — Other Ambulatory Visit: Payer: Self-pay | Admitting: Family Medicine

## 2023-03-17 ENCOUNTER — Telehealth: Payer: Self-pay

## 2023-03-17 ENCOUNTER — Telehealth: Payer: Self-pay | Admitting: Cardiology

## 2023-03-17 NOTE — Telephone Encounter (Signed)
Patient started levothyroxine in April.  She states in June the palpations were to a point that she "recognized it". Episodes last few hours to 1/2 a day.  Off an on on.  None if she stays calm and doesn't do anything that day.  Blurry vision all day every day.  One episode of dizziness back June 28, no other times.  Patient last seen in November.  Appt made for June 22nd.

## 2023-03-17 NOTE — Telephone Encounter (Signed)
Patient c/o Palpitations:  High priority if patient c/o lightheadedness, shortness of breath, or chest pain  How long have you had palpitations/irregular HR/ Afib? Are you having the symptoms now? Since May, is not having symptoms now  Are you currently experiencing lightheadedness, SOB or CP? No   Do you have a history of afib (atrial fibrillation) or irregular heart rhythm? Yes  Have you checked your BP or HR? (document readings if available): HR bounces between 38-100  Are you experiencing any other symptoms? Blurry vision & fatigue   Is having blurry vision now, but denies lightheadedness, SOB, or CP. States she started Levothyroxine in early May before symptoms began occurring.

## 2023-03-17 NOTE — Telephone Encounter (Signed)
Pt wants to know if it is normal to have a bruise during or after a leg cramp?I asked her how long this had been going on. She replied, "since surgery in May". I asked her what the size of bruises were. She replied "anywhere from size of orange to silver dollar". They are tender, but no redness. Could the levothyroxine be causing it?

## 2023-03-20 ENCOUNTER — Other Ambulatory Visit: Payer: Self-pay | Admitting: Family Medicine

## 2023-03-20 ENCOUNTER — Telehealth: Payer: Self-pay

## 2023-03-20 DIAGNOSIS — Z1231 Encounter for screening mammogram for malignant neoplasm of breast: Secondary | ICD-10-CM

## 2023-03-20 NOTE — Telephone Encounter (Signed)
Pt LVM on nurse line that her INR is 1.9 this morning. She is taking Coumadin 4mg  on Tue, Thu, and Sat. She takes Coumadin 3mg  all other days.

## 2023-03-25 NOTE — Progress Notes (Unsigned)
Cardiology Clinic Note   Date: 03/27/2023 ID: [name], DOB [birth year], MRN [id number]  Primary Cardiologist:  Thomasene Ripple, DO  Patient Profile    Pamela [name] Jaysha Lasure is a 77 y.o. female who presents to the clinic today for increased fatigue.    Past medical history significant for: Nonobstructive CAD. Coronary CTA 10/02/2020: Coronary calcium score 20.4 (39th percentile).  Minimal CAD.  Mild MAC.  Aortic atherosclerosis. Chronic diastolic heart failure. Echo 12/31/2019: EF 60 to 65%.  Moderate concentric LVH.  Grade I DD.  Normal RV function.  Mild LAE. Palpitations. Event monitor September 2012: Sinus rhythm with occasional PVCs.  At least 2 separate PVC morphologies.  PVCs were not closely coupled to T waves.  A single 5 beat run of NSVT. 7-day ZIO 11/14/2019: Asymptomatic PSVT which is likely A. tach with variable block.  Asymptomatic occasional PVCs. 14-day ZIO 09/14/2020: 8 supraventricular tachycardia runs, fastest lasting 10 beats with max rate 182 bpm, longest 9 beats.  Rare PACs/PVCs.  No V. tach, pauses, A-fib, AV block. PAF. Hypertension. Hyperlipidemia. Orthostatic hypotension. TIA. T2DM. Hypothyroidism.   CKD stage III. Protein S deficiency.     History of Present Illness    Pamela [name] was first evaluated by Dr. Johney Frame on 05/23/2011 for PVCs and palpitations.  She reported a 2 to 3-year history of palpitations typically brief lasting 1 to 2 seconds described as skipped beats worse when she is tired or doing heavy exertion.  PCP placed an event monitor which showed PVCs and NSVT (detailed above).  She underwent nuclear stress testing which was a low risk study without ischemia.  Echo showed normal LV function with Grade II DD and no valvular abnormalities.  She was seen in follow-up in May 2014 in which she complained of more symptomatic PVCs.  Echo showed EF 30 to 35% with diffuse hypokinesis, grade 1 DD, mild MR.  She  underwent LHC which showed normal coronary arteries and low normal EF.   Patient was first evaluated by Dr. Servando Salina on 11/14/2019.  At that time she complained of orthostatic hypotension, shortness of breath, palpitations.  Echo showed normal LV/RV function, Grade I DD, mild LAE.  She was evaluated by Dr. Servando Salina on 09/11/2020 for hospital follow-up.  She was evaluated at [facility] for chest pain.  CTA chest was negative for PE.  Coronary CTA was ordered which showed calcium score of 20.4 and minimal CAD.  Last seen in the office by Dr. Servando Salina on 07/15/2022 for routine follow-up.  She complained of bilateral leg cramping.  ABIs were normal.  Patient contacted the office on 03/17/2023 with complaints of increased palpitations.  Per triage notes "Patient started levothyroxine in April.  She states in June the palpations were to a point that she "recognized it". Episodes last few hours to 1/2 a day.  Off an on on.  None if she stays calm and doesn't do anything that day.  Blurry vision all day every day.  One episode of dizziness back June 28, no other times.  Patient last seen in November.  Appt made for June 22nd."  Today, patient is here alone.  She has a constellation of complaints today. When asked about her palpitations she reports racing that occurs at night, which is unchanged from previous complaints dating back to 2012.  She reports increased fatigue over the last year worsened since starting thyroid medication and undergoing back surgery in April.  Fatigue comes on during the day requiring  her to lay down for an hour or more before she can get up and continue activities until the fatigue returns.  She also reports associated chest pain when the fatigue comes on.  Pain is described as "my chest is being squished" and it is hard to breathe.  Chest discomfort can also occur with exertion.  She reports dyspnea with heavier exertion that resolves with rest. She also reports blurred vision for which she is  being followed by ophthalmology for macular degeneration.  She states continued bilateral leg cramping that she mentioned to neurosurgery who feels that it is related to spinal surgery and should eventually resolve.  She completed physical therapy 2 weeks ago and reports she tolerated physical therapy sessions well.  She is very concerned about her symptoms, as she has a strong family history of cardiac disease with her sister most recently being told she has macrovascular and microvascular coronary artery disease.    ROS: All other systems reviewed and are otherwise negative except as noted in History of Present Illness.  Studies Reviewed    EKG Interpretation Date/Time:  Monday March 27 2023 13:42:33 EDT Ventricular Rate:  70 PR Interval:  154 QRS Duration:  100 QT Interval:  382 QTC Calculation: 412 R Axis:   14  Text Interpretation: Sinus rhythm with marked sinus arrhythmia Minimal voltage criteria for LVH, may be normal variant ( Cornell product ) Nonspecific ST abnormality When compared with ECG of 05-Aug-2022 00:03, PREVIOUS ECG IS PRESENT Confirmed by Carlos Levering [phone]) on 03/27/2023 1:47:47 PM           Physical Exam    VS:  BP 110/60 (BP Location: Left Arm, Patient Position: Sitting, Cuff Size: Normal)   Pulse 70   Ht 5\' 2"  (1.575 m)   Wt 146 lb 6.4 oz (66.4 kg)   SpO2 94%   BMI 26.78 kg/m  , BMI Body mass index is 26.78 kg/m.  GEN: Well nourished, well developed, in no acute distress. Neck: No JVD or carotid bruits. Cardiac:  RRR. No murmurs. No rubs or gallops.   Respiratory:  Respirations regular and unlabored. Clear to auscultation without rales, wheezing or rhonchi. GI: Soft, nontender, nondistended. Extremities: Radials/DP/PT 2+ and equal bilaterally. No clubbing or cyanosis. No edema.  Skin: Warm and dry, no rash. Neuro: Strength intact.  Assessment & Plan    DOE/Fatigue. Patient reports a year history of progressive fatigue and dyspnea with heavy  exertion worsened since starting thyroid medication in April and undergoing spinal surgery. She reports fatigue is related to exertion and resolves with 60+ minutes of resting. She has not had her thyroid level checked since starting medication in April. Will get CBC, BMP and TSH today. Will get echo for further evaluation of DOE.  Nonobstructive CAD/atypical chest pain.  Coronary CTA January 2022 showed calcium score of 20.4, minimal CAD.  Patient reports episodes of chest discomfort described as "my chest being squished" associated with fatigue and exertion. Discomfort resolves with rest. She is very concerned, as she has a strong family history of heart disease with her younger sister recently being told she has macrovascular and microvascular CAD. Will get PET CT for further evaluation.  Continue aspirin, isosorbide, amlodipine.  Palpitations.  Patient with a long history of palpitations.  Prior event monitoring in 2012 and 2021 showed occasional PVCs.  Last 14-day ZIO January 2022 showed 8 runs of supraventricular tachycardia and rare PACs/PVCs.  Patient reports palpitations typically occur at night and feels like racing. This  is unchanged since her initial complaint in 2012. EKG shows sinus rhythm with sinus arrhythmia.  PAF.  This is not well-documented.  Patient is on Coumadin for protein S deficiency.  Anticoagulation managed by heme-onc. She denies spontaneous bleeding concerns.  Hypertension: BP today 110/60. Patient denies headaches, dizziness or vision changes. Continue amlodipine, isosorbide, lisinopril.  Disposition: CBC, BMP, TSH today. Echo for DOE, PET CT for chest pain. Return in 3-4 months or sooner as needed.   Informed Consent   Shared Decision Making/Informed Consent The risks [chest pain, shortness of breath, cardiac arrhythmias, dizziness, blood pressure fluctuations, myocardial infarction, stroke/transient ischemic attack, nausea, vomiting, allergic reaction, radiation exposure,  metallic taste sensation and life-threatening complications (estimated to be 1 in 10,000)], benefits (risk stratification, diagnosing coronary artery disease, treatment guidance) and alternatives of a cardiac PET stress test were discussed in detail with Ms. Macaluso and she agrees to proceed.            Signed, Etta Grandchild. Samanta Gal, DNP, NP-C

## 2023-03-27 ENCOUNTER — Ambulatory Visit: Payer: Medicare PPO | Attending: Student | Admitting: Student

## 2023-03-27 ENCOUNTER — Encounter: Payer: Self-pay | Admitting: Student

## 2023-03-27 VITALS — BP 110/60 | HR 70 | Ht 62.0 in | Wt 146.4 lb

## 2023-03-27 DIAGNOSIS — D6859 Other primary thrombophilia: Secondary | ICD-10-CM | POA: Diagnosis not present

## 2023-03-27 DIAGNOSIS — I480 Paroxysmal atrial fibrillation: Secondary | ICD-10-CM

## 2023-03-27 DIAGNOSIS — R0609 Other forms of dyspnea: Secondary | ICD-10-CM

## 2023-03-27 DIAGNOSIS — I2510 Atherosclerotic heart disease of native coronary artery without angina pectoris: Secondary | ICD-10-CM

## 2023-03-27 DIAGNOSIS — R002 Palpitations: Secondary | ICD-10-CM

## 2023-03-27 DIAGNOSIS — I10 Essential (primary) hypertension: Secondary | ICD-10-CM | POA: Diagnosis not present

## 2023-03-27 DIAGNOSIS — R0789 Other chest pain: Secondary | ICD-10-CM

## 2023-03-27 DIAGNOSIS — R5383 Other fatigue: Secondary | ICD-10-CM

## 2023-03-27 DIAGNOSIS — Z7901 Long term (current) use of anticoagulants: Secondary | ICD-10-CM | POA: Diagnosis not present

## 2023-03-27 NOTE — Patient Instructions (Addendum)
Medication Instructions:  Your physician recommends that you continue on your current medications as directed. Please refer to the Current Medication list given to you today.  *If you need a refill on your cardiac medications before your next appointment, please call your pharmacy*   Lab Work: Labs will be drawn today If you have labs (blood work) drawn today and your tests are completely normal, you will receive your results only by: MyChart Message (if you have MyChart) OR A paper copy in the mail If you have any lab test that is abnormal or we need to change your treatment, we will call you to review the results.   Testing/Procedures:  Your physician has requested that you have an echocardiogram. Echocardiography is a painless test that uses sound waves to create images of your heart. It provides your doctor with information about the size and shape of your heart and how well your heart's chambers and valves are working. This procedure takes approximately one hour. There are no restrictions for this procedure. Please do NOT wear cologne, perfume, aftershave, or lotions (deodorant is allowed). Please arrive 15 minutes prior to your appointment time.  CARDIAC PET- Your physician has requested that you have a Cardiac Pet Stress Test. This testing is completed at [facility] ([address] [place], [place] [address]). The schedulers will call you to get this scheduled. Please follow instructions below and call the office with any questions/concerns [phone]).    Follow-Up: At [facility], you and your health needs are our priority.  As part of our continuing mission to provide you with exceptional heart care, we have created designated Provider Care Teams.  These Care Teams include your primary Cardiologist (physician) and Advanced Practice Providers (APPs -  Physician Assistants and Nurse Practitioners) who all work together to provide you with the care  you need, when you need it.  We recommend signing up for the patient portal called "MyChart".  Sign up information is provided on this After Visit Summary.  MyChart is used to connect with patients for Virtual Visits (Telemedicine).  Patients are able to view lab/test results, encounter notes, upcoming appointments, etc.  Non-urgent messages can be sent to your provider as well.   To learn more about what you can do with MyChart, go to [email or web address].    Your next appointment:   3-66month(s)  Provider:   Thomasene Ripple, DO Or APP

## 2023-03-28 ENCOUNTER — Telehealth: Payer: Self-pay | Admitting: Hematology and Oncology

## 2023-03-28 LAB — CBC
Hematocrit: 40.5 % (ref 34.0–46.6)
Hemoglobin: 13 g/dL (ref 11.1–15.9)
MCH: 29.5 pg (ref 26.6–33.0)
MCHC: 32.1 g/dL (ref 31.5–35.7)
MCV: 92 fL (ref 79–97)
Platelets: 282 10*3/uL (ref 150–450)
RBC: 4.4 x10E6/uL (ref 3.77–5.28)
RDW: 12.9 % (ref 11.7–15.4)
WBC: 7.8 10*3/uL (ref 3.4–10.8)

## 2023-03-28 LAB — TSH: TSH: 1.04 u[IU]/mL (ref 0.450–4.500)

## 2023-03-28 LAB — BASIC METABOLIC PANEL
BUN/Creatinine Ratio: 20 (ref 12–28)
BUN: 21 mg/dL (ref 8–27)
CO2: 23 mmol/L (ref 20–29)
Calcium: 9.4 mg/dL (ref 8.7–10.3)
Chloride: 95 mmol/L — ABNORMAL LOW (ref 96–106)
Creatinine, Ser: 1.05 mg/dL — ABNORMAL HIGH (ref 0.57–1.00)
Glucose: 169 mg/dL — ABNORMAL HIGH (ref 70–99)
Potassium: 4 mmol/L (ref 3.5–5.2)
Sodium: 137 mmol/L (ref 134–144)
eGFR: 54 mL/min/{1.73_m2} — ABNORMAL LOW (ref >59)

## 2023-03-28 NOTE — Telephone Encounter (Signed)
Telephoned patient regarding home INR of 1.5. Was done yesterday, received fax today. She is taking Coumadin 3 mg M,W,F, 4 mg rest of days. She would like to go up to 5 mg daily. That would be more than a 25% dose increase, so I recommend 5mg  T,Th,Sat and 4 mg rest of days. Repeat home INR in 1 week. Patient expresses understanding.

## 2023-03-28 NOTE — Addendum Note (Signed)
Addended by: Carlos Levering on: 03/28/2023 12:27 PM   Modules accepted: Orders

## 2023-03-29 ENCOUNTER — Telehealth: Payer: Self-pay

## 2023-03-29 NOTE — Telephone Encounter (Signed)
Patient called stating since starting synthroid 25 mcg she has had blurry, vision at times, leg cramps, decreased HR and palpitations. Reports she was seen at [facility] 7/15. Had back surgery 7/18. Seen Cardiology 7/23 where they did a EKG, ordered a echo and another scan(??), checked her CMP, CBC and Tsh which were WNL. Patient questioned what other labs our office would do on 8/8; told her we would likely check lipids, A1c and any other tests at the providers discretion. She questioned should she continue synthroid since she has not received a return call from [facility]. Per Dr. Sedalia Muta, she may hold synthroid until her appt 8/10. Patient verbalized understanding.

## 2023-03-29 NOTE — Telephone Encounter (Signed)
Ended up transferring pt call to front desk to assist pt

## 2023-03-30 ENCOUNTER — Ambulatory Visit (INDEPENDENT_AMBULATORY_CARE_PROVIDER_SITE_OTHER): Payer: Medicare PPO

## 2023-03-30 VITALS — Ht 62.0 in | Wt 146.0 lb

## 2023-03-30 DIAGNOSIS — Z0000 Encounter for general adult medical examination without abnormal findings: Secondary | ICD-10-CM

## 2023-03-30 NOTE — Progress Notes (Signed)
Subjective:   Pamela [name] is a 77 y.o. female who presents for Medicare Annual (Subsequent) preventive examination.  Visit Complete: Virtual  I connected with  Amir Glaus on 03/30/23 by a audio enabled telemedicine application and verified that I am speaking with the correct person using two identifiers.  Patient Location: Home  Provider Location: Home Office  I discussed the limitations of evaluation and management by telemedicine. The patient expressed understanding and agreed to proceed.  Patient Medicare AWV questionnaire was completed by the patient on ; I have confirmed that all information answered by patient is correct and no changes since this date.  Review of Systems     Cardiac Risk Factors include: advanced age (>63men, >50 women);diabetes mellitus;hypertension     Objective:    Today's Vitals   03/30/23 1408 03/30/23 1409  Weight: 146 lb (66.2 kg)   Height: 5\' 2"  (1.575 m)   PainSc:  0-No pain   Body mass index is 26.7 kg/m.     01/26/2023    3:00 PM 12/24/2022    4:34 PM 12/05/2022    2:15 PM 11/30/2022   11:13 AM 11/16/2022   10:04 AM 06/06/2022   11:09 AM 02/03/2022   10:13 AM  Advanced Directives  Does Patient Have a Medical Advance Directive? No No No No No Yes No  Would patient like information on creating a medical advance directive? No - Patient declined No - Patient declined   Yes (MAU/Ambulatory/Procedural Areas - Information given)      Current Medications (verified) Outpatient Encounter Medications as of 03/30/2023  Medication Sig   acetaminophen (TYLENOL) 650 MG CR tablet Take 650-1,300 mg by mouth every 8 (eight) hours as needed for pain.   albuterol (VENTOLIN HFA) 108 (90 Base) MCG/ACT inhaler INHALE 1 TO 2 PUFFS INTO THE LUNGS EVERY 6 HOURS AS NEEDED FOR WHEEZING OR SHORTNESS OF BREATH   amLODipine (NORVASC) 5 MG tablet TAKE 1 TABLET(5 MG) BY MOUTH DAILY   aspirin EC (ASPIRIN LOW DOSE) 81 MG tablet TAKE 1  TABLET(81 MG) BY MOUTH DAILY   atorvastatin (LIPITOR) 80 MG tablet TAKE 1 TABLET(80 MG) BY MOUTH DAILY   Calcium Citrate-Vitamin D (CALCIUM CITRATE + PO) Take 1 tablet by mouth daily.   carboxymethylcellulose (REFRESH PLUS) 0.5 % SOLN Place 1 drop into both eyes 3 (three) times daily as needed (dry eyes).   Cholecalciferol (VITAMIN D3) 50 MCG (2000 UT) TABS Take 4,000 Units by mouth daily with lunch.   CINNAMON PO Take 1,000 mg by mouth daily.   clindamycin (CLEOCIN) 300 MG capsule Take 300 mg by mouth See admin instructions. 1 hour before dental procedures   Continuous Blood Gluc Sensor (FREESTYLE LIBRE 2 SENSOR) MISC 2 each by Does not apply route every 14 (fourteen) days. E11.40   cyanocobalamin (VITAMIN B12) 1000 MCG tablet Take 1,000 mcg by mouth daily.   dapagliflozin propanediol (FARXIGA) 10 MG TABS tablet Take 1 tablet (10 mg total) by mouth daily before breakfast.   denosumab (PROLIA) 60 MG/ML SOSY injection Inject 60 mg into the skin every 6 (six) months.   estradiol (ESTRACE) 0.1 MG/GM vaginal cream Place 1 Applicatorful vaginally 2 (two) times a week. As needed   ferrous sulfate 325 (65 FE) MG tablet Take 325 mg by mouth daily with lunch.   furosemide (LASIX) 40 MG tablet Take 1 tablet (40 mg total) by mouth daily.   GEMTESA 75 MG TABS Take 75 mg by mouth at bedtime.  Insulin Aspart FlexPen (NOVOLOG) 100 UNIT/ML Max daily 30 units (Patient taking differently: Inject 4-5 Units into the skin 3 (three) times daily with meals. Max daily 30 units)   insulin glargine, 1 Unit Dial, (TOUJEO) 300 UNIT/ML Solostar Pen Inject 8 Units into the skin at bedtime. (Patient taking differently: Inject 6 Units into the skin at bedtime.)   Insulin Pen Needle (BD PEN NEEDLE NANO 2ND GEN) 32G X 4 MM MISC USE TO INJECT INSULIN DAILY   ipratropium-albuterol (DUONEB) 0.5-2.5 (3) MG/3ML SOLN Take 3 mLs by nebulization 4 (four) times daily as needed (wheezing/shortness of breath).   isosorbide mononitrate  (IMDUR) 30 MG 24 hr tablet 1 tablet by mouth daily, 2nd attempt, please reach arrange appt for further refills   levothyroxine (SYNTHROID) 25 MCG tablet Take 1 tablet (25 mcg total) by mouth daily before breakfast.   lisinopril (ZESTRIL) 20 MG tablet TAKE 2 TABLETS(40 MG) BY MOUTH TWICE DAILY   magnesium oxide (MAG-OX) 400 (240 Mg) MG tablet TAKE 2 TABLETS(800 MG) BY MOUTH TWICE DAILY (Patient taking differently: Take 800 mg by mouth 2 (two) times daily.)   methocarbamol (ROBAXIN) 500 MG tablet Take 1 tablet (500 mg total) by mouth every 6 (six) hours as needed for muscle spasms.   methocarbamol (ROBAXIN) 750 MG tablet Take 750 mg by mouth at bedtime. (Patient not taking: Reported on 03/27/2023)   Multiple Vitamins-Minerals (PRESERVISION AREDS PO) Take 1 capsule by mouth 2 (two) times daily.   nystatin cream (MYCOSTATIN) Apply 1 Application topically 4 (four) times daily as needed for dry skin (yeast infection).   ondansetron (ZOFRAN-ODT) 8 MG disintegrating tablet Take 8 mg by mouth every 8 (eight) hours as needed for nausea or vomiting.    oxyCODONE-acetaminophen (PERCOCET/ROXICET) 5-325 MG tablet Take 1 tablet by mouth every 6 (six) hours as needed for moderate pain or severe pain.   pantoprazole (PROTONIX) 40 MG tablet TAKE 1 TABLET(40 MG) BY MOUTH EVERY MORNING   polyethylene glycol powder (MIRALAX) 17 GM/SCOOP powder Take 17 g by mouth daily as needed for moderate constipation.   pyridoxine (B-6) 100 MG tablet Take 100 mg by mouth daily.   sennosides-docusate sodium (SENOKOT-S) 8.6-50 MG tablet Take 1 tablet by mouth daily.   sodium chloride (OCEAN) 0.65 % SOLN nasal spray Place 1 spray into both nostrils as needed for congestion.   traMADol (ULTRAM) 50 MG tablet Take 50-100 mg by mouth every 6 (six) hours as needed.   traZODone (DESYREL) 50 MG tablet TAKE 1 TABLET(50 MG) BY MOUTH AT BEDTIME AS NEEDED FOR SLEEP   warfarin (COUMADIN) 1 MG tablet Take 1 mg by mouth every evening. Take with 5 mg  tab to get 6 mg   warfarin (COUMADIN) 3 MG tablet Take 1 tablet (3 mg total) by mouth daily.   warfarin (COUMADIN) 4 MG tablet Take 1 tablet (4 mg total) by mouth daily.   warfarin (COUMADIN) 5 MG tablet Take 5 mg by mouth every evening. Take with 1 mg tab to get 6 mg   No facility-administered encounter medications on file as of 03/30/2023.    Allergies (verified) Ketek [telithromycin], Loxapine succinate, Naproxen sodium, Amoxapine and related, Darvon, Belsomra [suvorexant], Cymbalta [duloxetine hcl], Duloxetine, Gabapentin, Lyrica [pregabalin], Ozempic (0.25 or 0.5 mg-dose) [semaglutide(0.25 or 0.5mg -dos)], Semaglutide, Amoxicillin, and Propoxyphene   History: Past Medical History:  Diagnosis Date   Anticoagulated on Coumadin    chronic--- managed by hematology/ oncology,   Asthma, mild intermittent    followed by pcp--- per pt last  exacerbation winter 2021 w/ acute bronchitis   Bilateral leg cramps    Blood dyscrasia    Chronic constipation    CKD (chronic kidney disease), stage III (HCC)    Closed bimalleolar fracture of left ankle 02/26/2020   DDD (degenerative disc disease), cervical    w/ spondylosis,  per pt last steroid injection 06/ 2022   DDD (degenerative disc disease), lumbosacral    DVT (deep venous thrombosis) (HCC) 07/30/2013   Dyspnea    occasionally   Dysrhythmia    GERD (gastroesophageal reflux disease)    History of cardiac murmur as a child    History of DVT of lower extremity    left lower extremity in 1980s, fell when bowling   History of pulmonary embolus (PE) 1993   per pt left lung post op 2 wks cholecystectomy   History of rheumatic fever as a child    per last echo 12-31-2019 no valvular issues   History of TIA (transient ischemic attack)    2014 and 2018 or 2019,  per pt no residual   HTN (hypertension)    followed by pcp   Hyperlipidemia    Hypothyroidism    IDA (iron deficiency anemia)    Macular degeneration of both eyes    Mild  obstructive sleep apnea    per pt dx 2017 tried to uses cpap but intolerant   Mixed incontinence urge and stress    urologist--- dr Saddie Benders   OA (osteoarthritis) 07/06/2018   Osteoporosis    PAF (paroxysmal atrial fibrillation) (HCC) 10/01/2014   cardiologist--- dr Lavona Mound tobb;   cardiac cath 02-18-2013 normal coronaries arteries, ef 50%, cath done since echo showed ef 30-35%; nucleat stress study 04/ 2020 normal , normal echo 04/ 2021,  event monitor 09-14-2020 rare ST/AT variable block   Pneumonia    PONV (postoperative nausea and vomiting)    Protein S deficiency (HCC)    followed by hemotology/ oncology-- dr d. Melvyn Neth ([facility]) dx 1980s;  prior DVT left lower leg 1980s and left lung PE 1993; chronic  coumadin since 1980s   PVC's (premature ventricular contractions)    followed by cardiology   S/P cardiac catheterization 02/2013   Normal coronaries; low normal EF at 50%   Solitary pulmonary nodule on lung CT 02/06/2019   First noted 01/13/2014 > no change as of 12/21/2018   Spondylolisthesis, lumbar region 08/09/2018   Stroke (HCC)    TIA in 2018 or 2019   Transient ischemic attack 07/30/2013   Type 2 diabetes mellitus treated with insulin (HCC)    endocrilogist--- whitney reardon NP     (03-10-2021  pt continuously checks blood sugar throughout the day w/ Libre, fasting sugar --- 69--200)   Wears glasses    Wears hearing aid in both ears    Past Surgical History:  Procedure Laterality Date   ABDOMINAL HYSTERECTOMY     BLEPHAROPLASTY     CARDIAC CATHETERIZATION  02/18/2013   @ [facility]  by Dr [place];  normal coronaries w/ preserved LVF, ef 50%;   previous cath 2001 normal ef 65%   CARPAL TUNNEL RELEASE Bilateral 1994   CATARACT EXTRACTION W/ INTRAOCULAR LENS IMPLANT Bilateral 2017   CHOLECYSTECTOMY     CHOLECYSTECTOMY, LAPAROSCOPIC  1993   COLONOSCOPY     EAR BIOPSY Left    FINGER SURGERY Left 2018   thumb   FOOT SURGERY     FOOT TENDON SURGERY Right    early  2000s   FRACTURE  SURGERY Left 02/13/2020   left femur   HAND SURGERY Right 04/2022   HARDWARE REMOVAL Left 03/12/2021   Procedure: HARDWARE REMOVAL;  Surgeon: Yolonda Kida, MD;  Location: [facility];  Service: Orthopedics;  Laterality: Left;  60 MINS   JOINT REPLACEMENT     LUMBAR LAMINECTOMY/DECOMPRESSION MICRODISCECTOMY N/A 12/22/2022   Procedure: Lumbar Two-Lumbar Three, Lumbar Three-Lumbar Four Lumbar Decompression;  Surgeon: Barnett Abu, MD;  Location: [facility] OR;  Service: Neurosurgery;  Laterality: N/A;  RM 20 3C   ORIF ANKLE FRACTURE Left 02/26/2020   Procedure: OPEN REDUCTION INTERNAL FIXATION (ORIF) ANKLE FRACTURE;  Surgeon: Yolonda Kida, MD;  Location: [facility] OR;  Service: Orthopedics;  Laterality: Left;  90 mins   TONSILLECTOMY AND ADENOIDECTOMY Bilateral    TOTAL KNEE ARTHROPLASTY Left 03/2005   TOTAL VAGINAL HYSTERECTOMY  1988   per pt still has ovaries   Family History  Problem Relation Age of Onset   Cirrhosis Mother    Antithrombin III deficiency Mother        multiple emboli   Ulcerative colitis Mother    Diabetes Father    Coronary artery disease Father    Heart attack Father    Hypertension Father    Kidney disease Father    Hypertension Sister    Diabetes Sister    Heart attack Sister        age 62    Protein S deficiency Daughter    Heart attack Brother    Hypertension Brother    Lung cancer Brother    Stroke Neg Hx    Colitis Neg Hx    Colon polyps Neg Hx    Esophageal cancer Neg Hx    Liver cancer Neg Hx    Pancreatic cancer Neg Hx    Rectal cancer Neg Hx    Stomach cancer Neg Hx    Breast cancer Neg Hx    Social History   Socioeconomic History   Marital status: Married    Spouse name: Jomarie Longs   Number of children: Not on file   Years of education: Not on file   Highest education level: Some college, no degree  Occupational History   Not on file  Tobacco Use   Smoking status: Never   Smokeless tobacco: Never   Vaping Use   Vaping status: Never Used  Substance and Sexual Activity   Alcohol use: No   Drug use: Never   Sexual activity: Not on file    Comment: Hysterectomy  Other Topics Concern   Not on file  Social History Narrative   Lives with spouse in [place].  2 grown daughters.   Retired Print production planner     Social Determinants of Health   Financial Resource Strain: Low Risk  (03/30/2023)   Overall Financial Resource Strain (CARDIA)    Difficulty of Paying Living Expenses: Not hard at all  Food Insecurity: No Food Insecurity (03/30/2023)   Hunger Vital Sign    Worried About Running Out of Food in the Last Year: Never true    Ran Out of Food in the Last Year: Never true  Transportation Needs: No Transportation Needs (03/30/2023)   PRAPARE - Administrator, Civil Service (Medical): No    Lack of Transportation (Non-Medical): No  Physical Activity: Sufficiently Active (03/30/2023)   Exercise Vital Sign    Days of Exercise per Week: 7 days    Minutes of Exercise per Session: 30 min  Stress: No Stress Concern Present (03/30/2023)  [employer] of Occupational Health - Occupational Stress Questionnaire    Feeling of Stress : Not at all  Social Connections: Socially Integrated (03/30/2023)   Social Connection and Isolation Panel [NHANES]    Frequency of Communication with Friends and Family: More than three times a week    Frequency of Social Gatherings with Friends and Family: More than three times a week    Attends Religious Services: More than 4 times per year    Active Member of [employer] or Organizations: Yes    Attends Engineer, structural: More than 4 times per year    Marital Status: Married    Tobacco Counseling Counseling given: Not Answered   Clinical Intake:  Pre-visit preparation completed: No  Pain : No/denies pain Pain Score: 0-No pain     BMI - recorded: 26.7 Nutritional Status: BMI 25 -29 Overweight Nutritional Risks: None Diabetes:  Yes CBG done?: Yes (CBG 161 Taken by patient) CBG resulted in Enter/ Edit results?: Yes Did pt. bring in CBG monitor from home?: No  How often do you need to have someone help you when you read instructions, pamphlets, or other written materials from your doctor or pharmacy?: 1 - Never  Interpreter Needed?: No  Information entered by :: Theresa Mulligan LPN   Activities of Daily Living    03/30/2023    2:18 PM 12/22/2022    6:42 AM  In your present state of health, do you have any difficulty performing the following activities:  Hearing? 1 1  Comment Wears hearing aids Bilateral Hearing Aids  Vision? 0 1  Difficulty concentrating or making decisions? 0 1  Comment  Only when tired or in pain  Walking or climbing stairs? 0 1  Dressing or bathing? 0 0  Doing errands, shopping? 0   Preparing Food and eating ? N   Using the Toilet? N   In the past six months, have you accidently leaked urine? Y   Comment Wears breifs. Followed by Urologist   Do you have problems with loss of bowel control? N   Managing your Medications? N   Managing your Finances? N   Housekeeping or managing your Housekeeping? N     Patient Care Team: Blane Ohara, MD as PCP - General (Internal Medicine) Thomasene Ripple, DO as PCP - Cardiology (Cardiology) Weston Settle, MD as Consulting Physician (Oncology) Roxanne Gates, OD (Optometry) Ortho, [facility] (Specialist) Nyoka Cowden, MD as Consulting Physician (Pulmonary Disease) Drema Halon, MD (Inactive) as Consulting Physician (Otolaryngology) Thomasene Ripple, DO as Consulting Physician (Cardiology) Romero Belling, MD (Inactive) as Consulting Physician (Endocrinology) Zenovia Jordan, MD as Consulting Physician (Rheumatology) Armbruster, Willaim Rayas, MD as Consulting Physician (Gastroenterology) Zettie Pho, [facility] (Inactive) (Pharmacist)  Indicate any recent Medical Services you may have received from other than [facility] providers in the past year (date  may be approximate).     Assessment:   This is a routine wellness examination for [place].  Hearing/Vision screen Hearing Screening - Comments:: Wears hearing aids Vision Screening - Comments:: Wears rx glasses - up to date with routine eye exams with  [facility]  Dietary issues and exercise activities discussed:     Goals Addressed               This Visit's Progress     Prevent falls (pt-stated)         Depression Screen    03/30/2023    2:14 PM 12/14/2022    9:06 AM 02/15/2022  1:16 PM 01/24/2022    8:21 AM 11/16/2021   11:37 AM 03/10/2021    8:35 AM 11/12/2020    9:42 AM  PHQ 2/9 Scores  PHQ - 2 Score 0 0 0 0 0 0 0  PHQ- 9 Score       0    Fall Risk    03/30/2023    2:20 PM 12/14/2022    9:06 AM 02/15/2022    1:15 PM 01/24/2022    8:21 AM 11/16/2021   11:37 AM  Fall Risk   Falls in the past year? 1 0 1 1 0  Number falls in past yr: 0 0 0 0 0  Injury with Fall? 1 0 1 1 0  Comment Fx rt foot. Followed by medical attention      Risk for fall due to : No Fall Risks History of fall(s) Impaired balance/gait;History of fall(s)    Follow up Falls prevention discussed Falls evaluation completed;Falls prevention discussed Falls evaluation completed;Falls prevention discussed Falls evaluation completed Falls evaluation completed    MEDICARE RISK AT HOME:  Medicare Risk at Home - 03/30/23 1426     Any stairs in or around the home? Yes    If so, are there any without handrails? No    Home free of loose throw rugs in walkways, pet beds, electrical cords, etc? Yes    Adequate lighting in your home to reduce risk of falls? Yes    Life alert? Yes    Use of a cane, walker or w/c? Yes    Grab bars in the bathroom? No    Shower chair or bench in shower? Yes    Elevated toilet seat or a handicapped toilet? Yes             TIMED UP AND GO:  Was the test performed?  No    Cognitive Function:    10/18/2021    8:49 AM  MMSE - Mini Mental State Exam  Orientation  to time 5  Orientation to Place 5  Registration 3  Attention/ Calculation 5  Recall 2  Language- name 2 objects 2  Language- repeat 1  Language- follow 3 step command 3  Language- read & follow direction 1  Write a sentence 1  Copy design 1  Total score 29        03/30/2023    2:21 PM 02/21/2022    1:17 PM  6CIT Screen  What Year? 0 points 0 points  What month? 0 points 0 points  What time? 0 points 0 points  Count back from 20 0 points 0 points  Months in reverse 0 points 2 points  Repeat phrase 0 points 2 points  Total Score 0 points 4 points    Immunizations Immunization History  Administered Date(s) Administered   COVID-19, mRNA, vaccine(Comirnaty)12 years and older 09/07/2022   Fluad Quad(high Dose 65+) 05/26/2020, 05/13/2021, 06/09/2022   Influenza,inj,Quad PF,6+ Mos 06/04/2013   PFIZER Comirnaty(Gray Top)Covid-19 Tri-Sucrose Vaccine 02/04/2021   PFIZER(Purple Top)SARS-COV-2 Vaccination 09/22/2019, 10/12/2019, 06/16/2020   PNEUMOCOCCAL CONJUGATE-20 02/15/2022   Pfizer Covid-19 Vaccine Bivalent Booster 80yrs & up 08/17/2021   Pneumococcal Conjugate-13 07/30/2014   Pneumococcal Polysaccharide-23 03/31/2008   Tdap 11/14/2013   Zoster, Live 03/31/2008, 06/03/2011    TDAP status: Up to date  Flu Vaccine status: Up to date  Pneumococcal vaccine status: Up to date  Covid-19 vaccine status: Completed vaccines  Qualifies for Shingles Vaccine? Yes   Zostavax completed No   Shingrix Completed?: No.  Education has been provided regarding the importance of this vaccine. Patient has been advised to call insurance company to determine out of pocket expense if they have not yet received this vaccine. Advised may also receive vaccine at local pharmacy or Health Dept. Verbalized acceptance and understanding.  Screening Tests Health Maintenance  Topic Date Due   Zoster Vaccines- Shingrix (1 of 2) 09/17/1994   OPHTHALMOLOGY EXAM  10/21/2022   COVID-19 Vaccine (7 -  2023-24 season) 01/06/2023   INFLUENZA VACCINE  04/06/2023   HEMOGLOBIN A1C  06/13/2023   Diabetic kidney evaluation - Urine ACR  09/08/2023   FOOT EXAM  09/08/2023   MAMMOGRAM  10/26/2023   DTaP/Tdap/Td (2 - Td or Tdap) 11/15/2023   Diabetic kidney evaluation - eGFR measurement  03/26/2024   Medicare Annual Wellness (AWV)  03/29/2024   Pneumonia Vaccine 62+ Years old  Completed   DEXA SCAN  Completed   Hepatitis C Screening  Completed   HPV VACCINES  Aged Out   Colonoscopy  Discontinued    Health Maintenance  Health Maintenance Due  Topic Date Due   Zoster Vaccines- Shingrix (1 of 2) 09/17/1994   OPHTHALMOLOGY EXAM  10/21/2022   COVID-19 Vaccine (7 - 2023-24 season) 01/06/2023    Colorectal cancer screening: No longer required.   Mammogram status: Completed 04/12/23. Repeat every year  Bone Density status: Completed 07/17/23. Results reflect: Bone density results: OSTEOPOROSIS. Repeat every   years.  Lung Cancer Screening: (Low Dose CT Chest recommended if Age 44-80 years, 20 pack-year currently smoking OR have quit w/in 15years.) does not qualify.     Additional Screening:  Hepatitis C Screening: does qualify; Completed 12/12/22  Vision Screening: Recommended annual ophthalmology exams for early detection of glaucoma and other disorders of the eye. Is the patient up to date with their annual eye exam?  Yes  Who is the provider or what is the name of the office in which the patient attends annual eye exams? [facility] If pt is not established with a provider, would they like to be referred to a provider to establish care? No .   Dental Screening: Recommended annual dental exams for proper oral hygiene  Diabetic Foot Exam: Diabetic Foot Exam: Completed 09/07/22  Community Resource Referral / Chronic Care Management:  CRR required this visit?  No   CCM required this visit?  No     Plan:     I have personally reviewed and noted the following in the patient's  chart:   Medical and social history Use of alcohol, tobacco or illicit drugs  Current medications and supplements including opioid prescriptions. Patient is currently taking opioid prescriptions. Information provided to patient regarding non-opioid alternatives. Patient advised to discuss non-opioid treatment plan with their provider. Functional ability and status Nutritional status Physical activity Advanced directives List of other physicians Hospitalizations, surgeries, and ER visits in previous 12 months Vitals Screenings to include cognitive, depression, and falls Referrals and appointments  In addition, I have reviewed and discussed with patient certain preventive protocols, quality metrics, and best practice recommendations. A written personalized care plan for preventive services as well as general preventive health recommendations were provided to patient.     [name], LPN   [id number]/61/[id number]   After Visit Summary: (MyChart) Due to this being a telephonic visit, the after visit summary with patients personalized plan was offered to patient via MyChart   Nurse Notes: None

## 2023-03-30 NOTE — Patient Instructions (Addendum)
Pamela [name] , Thank you for taking time to come for your Medicare Wellness Visit. I appreciate your ongoing commitment to your health goals. Please review the following plan we discussed and let me know if I can assist you in the future.   Referrals/Orders/Follow-Ups/Clinician Recommendations:   This is a list of the screening recommended for you and due dates:  Health Maintenance  Topic Date Due   Zoster (Shingles) Vaccine (1 of 2) 09/17/1994   Eye exam for diabetics  10/21/2022   COVID-19 Vaccine (7 - 2023-24 season) 01/06/2023   Flu Shot  04/06/2023   Hemoglobin A1C  06/13/2023   Yearly kidney health urinalysis for diabetes  09/08/2023   Complete foot exam   09/08/2023   Mammogram  10/26/2023   DTaP/Tdap/Td vaccine (2 - Td or Tdap) 11/15/2023   Yearly kidney function blood test for diabetes  03/26/2024   Medicare Annual Wellness Visit  03/29/2024   Pneumonia Vaccine  Completed   DEXA scan (bone density measurement)  Completed   Hepatitis C Screening  Completed   HPV Vaccine  Aged Out   Colon Cancer Screening  Discontinued   Opioid Pain Medicine Management Opioids are powerful medicines that are used to treat moderate to severe pain. When used for short periods of time, they can help you to: Sleep better. Do better in physical or occupational therapy. Feel better in the first few days after an injury. Recover from surgery. Opioids should be taken with the supervision of a trained health care provider. They should be taken for the shortest period of time possible. This is because opioids can be addictive, and the longer you take opioids, the greater your risk of addiction. This addiction can also be called opioid use disorder. What are the risks? Using opioid pain medicines for longer than 3 days increases your risk of side effects. Side effects include: Constipation. Nausea and vomiting. Breathing difficulties (respiratory depression). Drowsiness. Confusion. Opioid use  disorder. Itching. Taking opioid pain medicine for a long period of time can affect your ability to do daily tasks. It also puts you at risk for: Motor vehicle crashes. Depression. Suicide. Heart attack. Overdose, which can be life-threatening. What is a pain treatment plan? A pain treatment plan is an agreement between you and your health care provider. Pain is unique to each person, and treatments vary depending on your condition. To manage your pain, you and your health care provider need to work together. To help you do this: Discuss the goals of your treatment, including how much pain you might expect to have and how you will manage the pain. Review the risks and benefits of taking opioid medicines. Remember that a good treatment plan uses more than one approach and minimizes the chance of side effects. Be honest about the amount of medicines you take and about any drug or alcohol use. Get pain medicine prescriptions from only one health care provider. Pain can be managed with many types of alternative treatments. Ask your health care provider to refer you to one or more specialists who can help you manage pain through: Physical or occupational therapy. Counseling (cognitive behavioral therapy). Good nutrition. Biofeedback. Massage. Meditation. Non-opioid medicine. Following a gentle exercise program. How to use opioid pain medicine Taking medicine Take your pain medicine exactly as told by your health care provider. Take it only when you need it. If your pain gets less severe, you may take less than your prescribed dose if your health care provider approves. If you  are not having pain, do nottake pain medicine unless your health care provider tells you to take it. If your pain is severe, do nottry to treat it yourself by taking more pills than instructed on your prescription. Contact your health care provider for help. Write down the times when you take your pain medicine. It is  easy to become confused while on pain medicine. Writing the time can help you avoid overdose. Take other over-the-counter or prescription medicines only as told by your health care provider. Keeping yourself and others safe  While you are taking opioid pain medicine: Do not drive, use machinery, or power tools. Do not sign legal documents. Do not drink alcohol. Do not take sleeping pills. Do not supervise children by yourself. Do not do activities that require climbing or being in high places. Do not go to a lake, river, ocean, spa, or swimming pool. Do not share your pain medicine with anyone. Keep pain medicine in a locked cabinet or in a secure area where pets and children cannot reach it. Stopping your use of opioids If you have been taking opioid medicine for more than a few weeks, you may need to slowly decrease (taper) how much you take until you stop completely. Tapering your use of opioids can decrease your risk of symptoms of withdrawal, such as: Pain and cramping in the abdomen. Nausea. Sweating. Sleepiness. Restlessness. Uncontrollable shaking (tremors). Cravings for the medicine. Do not attempt to taper your use of opioids on your own. Talk with your health care provider about how to do this. Your health care provider may prescribe a step-down schedule based on how much medicine you are taking and how long you have been taking it. Getting rid of leftover pills Do not save any leftover pills. Get rid of leftover pills safely by: Taking the medicine to a prescription take-back program. This is usually offered by the county or law enforcement. Bringing them to a pharmacy that has a drug disposal container. Flushing them down the toilet. Check the label or package insert of your medicine to see whether this is safe to do. Throwing them out in the trash. Check the label or package insert of your medicine to see whether this is safe to do. If it is safe to throw it out, remove  the medicine from the original container, put it into a sealable bag or container, and mix it with used coffee grounds, food scraps, dirt, or cat litter before putting it in the trash. Follow these instructions at home: Activity Do exercises as told by your health care provider. Avoid activities that make your pain worse. Return to your normal activities as told by your health care provider. Ask your health care provider what activities are safe for you. General instructions You may need to take these actions to prevent or treat constipation: Drink enough fluid to keep your urine pale yellow. Take over-the-counter or prescription medicines. Eat foods that are high in fiber, such as beans, whole grains, and fresh fruits and vegetables. Limit foods that are high in fat and processed sugars, such as fried or sweet foods. Keep all follow-up visits. This is important. Where to find support If you have been taking opioids for a long time, you may benefit from receiving support for quitting from a local support group or counselor. Ask your health care provider for a referral to these resources in your area. Where to find more information Centers for Disease Control and Prevention (CDC): FootballExhibition.com.br [place] Food and  Drug Administration (FDA): PumpkinSearch.com.ee Get help right away if: You may have taken too much of an opioid (overdosed). Common symptoms of an overdose: Your breathing is slower or more shallow than normal. You have a very slow heartbeat (pulse). You have slurred speech. You have nausea and vomiting. Your pupils become very small. You have other potential symptoms: You are very confused. You faint or feel like you will faint. You have cold, clammy skin. You have blue lips or fingernails. You have thoughts of harming yourself or harming others. These symptoms may represent a serious problem that is an emergency. Do not wait to see if the symptoms will go away. Get medical help right away.  Call your local emergency services (911 in the [place] Do not drive yourself to the hospital.  If you ever feel like you may hurt yourself or others, or have thoughts about taking your own life, get help right away. Go to your nearest emergency department or: Call your local emergency services (911 in the [place] Call the [facility] (213-155-5688 in the [place] Call a suicide crisis helpline, such as the National Suicide Prevention Lifeline at (913) 138-9340 or 988 in the [place] This is open 24 hours a day in the [place] Text the Crisis Text Line at 561 224 9031 (in the [place] Summary Opioid medicines can help you manage moderate to severe pain for a short period of time. A pain treatment plan is an agreement between you and your health care provider. Discuss the goals of your treatment, including how much pain you might expect to have and how you will manage the pain. If you think that you or someone else may have taken too much of an opioid, get medical help right away. This information is not intended to replace advice given to you by your health care provider. Make sure you discuss any questions you have with your health care provider. Document Revised: 03/17/2021 Document Reviewed: 12/02/2020 Elsevier Patient Education  2024 Else[employer] Inc.  Advanced directives: In chart  Next Medicare Annual Wellness Visit scheduled for next year: Yes  Preventive Care 22 Years and Older, Female Preventive care refers to lifestyle choices and visits with your health care provider that can promote health and wellness. What does preventive care include? A yearly physical exam. This is also called an annual well check. Dental exams once or twice a year. Routine eye exams. Ask your health care provider how often you should have your eyes checked. Personal lifestyle choices, including: Daily care of your teeth and gums. Regular physical activity. Eating a healthy diet. Avoiding tobacco and drug  use. Limiting alcohol use. Practicing safe sex. Taking low-dose aspirin every day. Taking vitamin and mineral supplements as recommended by your health care provider. What happens during an annual well check? The services and screenings done by your health care provider during your annual well check will depend on your age, overall health, lifestyle risk factors, and family history of disease. Counseling  Your health care provider may ask you questions about your: Alcohol use. Tobacco use. Drug use. Emotional well-being. Home and relationship well-being. Sexual activity. Eating habits. History of falls. Memory and ability to understand (cognition). Work and work Astronomer. Reproductive health. Screening  You may have the following tests or measurements: Height, weight, and BMI. Blood pressure. Lipid and cholesterol levels. These may be checked every 5 years, or more frequently if you are over 49 years old. Skin check. Lung cancer screening. You may have this screening every year starting  at age 66 if you have a 30-pack-year history of smoking and currently smoke or have quit within the past 15 years. Fecal occult blood test (FOBT) of the stool. You may have this test every year starting at age 52. Flexible sigmoidoscopy or colonoscopy. You may have a sigmoidoscopy every 5 years or a colonoscopy every 10 years starting at age 15. Hepatitis C blood test. Hepatitis B blood test. Sexually transmitted disease (STD) testing. Diabetes screening. This is done by checking your blood sugar (glucose) after you have not eaten for a while (fasting). You may have this done every 1-3 years. Bone density scan. This is done to screen for osteoporosis. You may have this done starting at age 40. Mammogram. This may be done every 1-2 years. Talk to your health care provider about how often you should have regular mammograms. Talk with your health care provider about your test results, treatment  options, and if necessary, the need for more tests. Vaccines  Your health care provider may recommend certain vaccines, such as: Influenza vaccine. This is recommended every year. Tetanus, diphtheria, and acellular pertussis (Tdap, Td) vaccine. You may need a Td booster every 10 years. Zoster vaccine. You may need this after age 73. Pneumococcal 13-valent conjugate (PCV13) vaccine. One dose is recommended after age 58. Pneumococcal polysaccharide (PPSV23) vaccine. One dose is recommended after age 97. Talk to your health care provider about which screenings and vaccines you need and how often you need them. This information is not intended to replace advice given to you by your health care provider. Make sure you discuss any questions you have with your health care provider. Document Released: 09/18/2015 Document Revised: 05/11/2016 Document Reviewed: 06/23/2015 Elsevier Interactive Patient Education  2017 [employer].  Fall Prevention in the Home Falls can cause injuries. They can happen to people of all ages. There are many things you can do to make your home safe and to help prevent falls. What can I do on the outside of my home? Regularly fix the edges of walkways and driveways and fix any cracks. Remove anything that might make you trip as you walk through a door, such as a raised step or threshold. Trim any bushes or trees on the path to your home. Use bright outdoor lighting. Clear any walking paths of anything that might make someone trip, such as rocks or tools. Regularly check to see if handrails are loose or broken. Make sure that both sides of any steps have handrails. Any raised decks and porches should have guardrails on the edges. Have any leaves, snow, or ice cleared regularly. Use sand or salt on walking paths during winter. Clean up any spills in your garage right away. This includes oil or grease spills. What can I do in the bathroom? Use night lights. Install grab  bars by the toilet and in the tub and shower. Do not use towel bars as grab bars. Use non-skid mats or decals in the tub or shower. If you need to sit down in the shower, use a plastic, non-slip stool. Keep the floor dry. Clean up any water that spills on the floor as soon as it happens. Remove soap buildup in the tub or shower regularly. Attach bath mats securely with double-sided non-slip rug tape. Do not have throw rugs and other things on the floor that can make you trip. What can I do in the bedroom? Use night lights. Make sure that you have a light by your bed that is easy  to reach. Do not use any sheets or blankets that are too big for your bed. They should not hang down onto the floor. Have a firm chair that has side arms. You can use this for support while you get dressed. Do not have throw rugs and other things on the floor that can make you trip. What can I do in the kitchen? Clean up any spills right away. Avoid walking on wet floors. Keep items that you use a lot in easy-to-reach places. If you need to reach something above you, use a strong step stool that has a grab bar. Keep electrical cords out of the way. Do not use floor polish or wax that makes floors slippery. If you must use wax, use non-skid floor wax. Do not have throw rugs and other things on the floor that can make you trip. What can I do with my stairs? Do not leave any items on the stairs. Make sure that there are handrails on both sides of the stairs and use them. Fix handrails that are broken or loose. Make sure that handrails are as long as the stairways. Check any carpeting to make sure that it is firmly attached to the stairs. Fix any carpet that is loose or worn. Avoid having throw rugs at the top or bottom of the stairs. If you do have throw rugs, attach them to the floor with carpet tape. Make sure that you have a light switch at the top of the stairs and the bottom of the stairs. If you do not have them,  ask someone to add them for you. What else can I do to help prevent falls? Wear shoes that: Do not have high heels. Have rubber bottoms. Are comfortable and fit you well. Are closed at the toe. Do not wear sandals. If you use a stepladder: Make sure that it is fully opened. Do not climb a closed stepladder. Make sure that both sides of the stepladder are locked into place. Ask someone to hold it for you, if possible. Clearly mark and make sure that you can see: Any grab bars or handrails. First and last steps. Where the edge of each step is. Use tools that help you move around (mobility aids) if they are needed. These include: Canes. Walkers. Scooters. Crutches. Turn on the lights when you go into a dark area. Replace any light bulbs as soon as they burn out. Set up your furniture so you have a clear path. Avoid moving your furniture around. If any of your floors are uneven, fix them. If there are any pets around you, be aware of where they are. Review your medicines with your doctor. Some medicines can make you feel dizzy. This can increase your chance of falling. Ask your doctor what other things that you can do to help prevent falls. This information is not intended to replace advice given to you by your health care provider. Make sure you discuss any questions you have with your health care provider. Document Released: 06/18/2009 Document Revised: 01/28/2016 Document Reviewed: 09/26/2014 Elsevier Interactive Patient Education  2017 [employer].

## 2023-04-04 ENCOUNTER — Telehealth: Payer: Self-pay

## 2023-04-04 ENCOUNTER — Encounter: Payer: Self-pay | Admitting: Oncology

## 2023-04-04 NOTE — Telephone Encounter (Signed)
Pt currently taking Coumadin 4mg  M,W,F,Sun; 3mg  all other days.

## 2023-04-10 ENCOUNTER — Telehealth: Payer: Self-pay | Admitting: Hematology and Oncology

## 2023-04-10 DIAGNOSIS — N952 Postmenopausal atrophic vaginitis: Secondary | ICD-10-CM | POA: Diagnosis not present

## 2023-04-10 DIAGNOSIS — N3946 Mixed incontinence: Secondary | ICD-10-CM | POA: Diagnosis not present

## 2023-04-10 DIAGNOSIS — B3731 Acute candidiasis of vulva and vagina: Secondary | ICD-10-CM | POA: Diagnosis not present

## 2023-04-10 DIAGNOSIS — N390 Urinary tract infection, site not specified: Secondary | ICD-10-CM | POA: Diagnosis not present

## 2023-04-10 NOTE — Telephone Encounter (Signed)
Telephoned patient regarding home INR of 1.7.  She is currently taking warfarin 5 mg Tuesday, Thursday, Saturday and 4 mg the rest of the days.  She would like to increase to 5 mg daily so we will do so.  Will repeat home INR in 1 week.  The patient expresses understanding.

## 2023-04-12 ENCOUNTER — Ambulatory Visit
Admission: RE | Admit: 2023-04-12 | Discharge: 2023-04-12 | Disposition: A | Discharge status: Home / Self Care | Payer: Medicare PPO | Source: Ambulatory Visit | Attending: Family Medicine | Admitting: Family Medicine

## 2023-04-12 DIAGNOSIS — Z1231 Encounter for screening mammogram for malignant neoplasm of breast: Secondary | ICD-10-CM | POA: Diagnosis not present

## 2023-04-13 ENCOUNTER — Other Ambulatory Visit: Payer: Medicare PPO

## 2023-04-13 DIAGNOSIS — I110 Hypertensive heart disease with heart failure: Secondary | ICD-10-CM

## 2023-04-13 DIAGNOSIS — I5030 Unspecified diastolic (congestive) heart failure: Secondary | ICD-10-CM | POA: Diagnosis not present

## 2023-04-13 DIAGNOSIS — Z794 Long term (current) use of insulin: Secondary | ICD-10-CM | POA: Diagnosis not present

## 2023-04-13 DIAGNOSIS — E782 Mixed hyperlipidemia: Secondary | ICD-10-CM | POA: Diagnosis not present

## 2023-04-13 DIAGNOSIS — I480 Paroxysmal atrial fibrillation: Secondary | ICD-10-CM

## 2023-04-13 DIAGNOSIS — E1140 Type 2 diabetes mellitus with diabetic neuropathy, unspecified: Secondary | ICD-10-CM | POA: Diagnosis not present

## 2023-04-14 ENCOUNTER — Other Ambulatory Visit: Payer: Self-pay | Admitting: Cardiology

## 2023-04-14 DIAGNOSIS — M13841 Other specified arthritis, right hand: Secondary | ICD-10-CM | POA: Diagnosis not present

## 2023-04-14 DIAGNOSIS — R252 Cramp and spasm: Secondary | ICD-10-CM

## 2023-04-14 DIAGNOSIS — I10 Essential (primary) hypertension: Secondary | ICD-10-CM

## 2023-04-16 NOTE — Assessment & Plan Note (Signed)
Continue on levothyroxine 25 mcg once daily  Check tsh and free T4 in 6 weeks.

## 2023-04-16 NOTE — Assessment & Plan Note (Signed)
Well controlled.  No changes to medicines. Continue atorvastatin 80 mg before bed.  Continue to work on eating a healthy diet and exercise.  Labs reviewed

## 2023-04-16 NOTE — Assessment & Plan Note (Signed)
Control: fair. Sees endocrinology.  Recommend check sugars before meals and before bed. Has CGM. Recommend check feet daily. Recommend annual eye exams. Medicines: defer to endocrine. Continue to work on eating a healthy diet and exercise.  Labs reviewed today

## 2023-04-16 NOTE — Progress Notes (Addendum)
Subjective:  Patient ID: Pamela [name], female    DOB: 03/06/1946  Age: 77 y.o. MRN: [id number]  Chief Complaint  Patient presents with   Diabetes    HPI Diabetes:  Complications: Hypertension Glucose checking: Several times a day (CGM) (113-300). Sees Endocrinology.  Hypoglycemia: 2-3 weekly  Most recent A1C: 7.6% Current medications: Farxiga 10 mg daily, Toujeo 8 units daily.   NovoLog sliding scale insulin as follows:  4 U before each meal +  171-220 +1 U 221-270 +2 U 271-320 +3 U 321-370 + 4 U 371- 420 + 5 U 421- 470 + 6 U  Last Eye Exam: 10/21/2021 Foot checks: several days.  Hyperlipidemia: Current medications: Atorvastatin 80 mg daily.  Hypertension: Complications: Diabetes, Hyperlipidemia. Current medications: Lisinopril 20 mg 2 daily, Amlodipine 5 mg daily, Aspirin 81 mg daily, Isosorbide 30 mg daily.  Atrial fibrillation: on coumadin.  GERD: protonix 40 mg once daily.   Protein S Deficiency: Dr. Melvyn Neth manages her coumadin. On coumadin daily. Uses MD PT/INR at home. *   Urge incontinence: on gemtesa 75 mg once daily. Working well.   Recent bladder infection treated by urology.  She is finishing up Macrobid.  Unfortunately she still having UTI symptoms.  Patient is also complaining of fatigue and muscle cramps.     04/17/2023    8:55 AM 03/30/2023    2:14 PM 12/14/2022    9:06 AM 02/15/2022    1:16 PM 01/24/2022    8:21 AM  Depression screen PHQ 2/9  Decreased Interest 1 0 0 0 0  Down, Depressed, Hopeless 0 0 0 0 0  PHQ - 2 Score 1 0 0 0 0  Altered sleeping 3      Tired, decreased energy 2      Change in appetite 0      Feeling bad or failure about yourself  0      Trouble concentrating 1      Moving slowly or fidgety/restless 1      Suicidal thoughts 0      PHQ-9 Score 8      Difficult doing work/chores Somewhat difficult            04/17/2023    8:55 AM  Fall Risk   Falls in the past year? 1  Number falls in past yr: 1   Injury with Fall? 1  Risk for fall due to : History of fall(s);Impaired balance/gait  Follow up Falls evaluation completed;Falls prevention discussed    Patient Care Team: Blane Ohara, MD as PCP - General (Internal Medicine) Thomasene Ripple, DO as PCP - Cardiology (Cardiology) Weston Settle, MD as Consulting Physician (Oncology) Roxanne Gates, OD (Optometry) Ortho, [facility] (Specialist) Nyoka Cowden, MD as Consulting Physician (Pulmonary Disease) Drema Halon, MD (Inactive) as Consulting Physician (Otolaryngology) Thomasene Ripple, DO as Consulting Physician (Cardiology) Romero Belling, MD (Inactive) as Consulting Physician (Endocrinology) Zenovia Jordan, MD as Consulting Physician (Rheumatology) Armbruster, Willaim Rayas, MD as Consulting Physician (Gastroenterology)   Review of Systems  Constitutional:  Positive for fatigue. Negative for chills and fever.  HENT:  Positive for ear pain. Negative for congestion, rhinorrhea and sore throat.   Respiratory:  Negative for cough and shortness of breath.   Cardiovascular:  Positive for palpitations. Negative for chest pain.  Gastrointestinal:  Negative for abdominal pain, constipation, diarrhea, nausea and vomiting.  Genitourinary:  Positive for dysuria. Negative for urgency.  Musculoskeletal:  Positive for myalgias (severe leg cramps). Negative for back pain.  Neurological:  Positive for headaches. Negative for dizziness, weakness and light-headedness.  Psychiatric/Behavioral:  Negative for dysphoric mood. The patient is not nervous/anxious.     Current Outpatient Medications on File Prior to Visit  Medication Sig Dispense Refill   methocarbamol (ROBAXIN) 750 MG tablet Take 750 mg by mouth at bedtime.     acetaminophen (TYLENOL) 650 MG CR tablet Take 650-1,300 mg by mouth every 8 (eight) hours as needed for pain.     albuterol (VENTOLIN HFA) 108 (90 Base) MCG/ACT inhaler INHALE 1 TO 2 PUFFS INTO THE LUNGS EVERY 6 HOURS AS NEEDED FOR  WHEEZING OR SHORTNESS OF BREATH 6.7 g 3   amLODipine (NORVASC) 5 MG tablet TAKE 1 TABLET(5 MG) BY MOUTH DAILY 90 tablet 3   aspirin EC (ASPIRIN LOW DOSE) 81 MG tablet TAKE 1 TABLET(81 MG) BY MOUTH DAILY 90 tablet 3   atorvastatin (LIPITOR) 80 MG tablet TAKE 1 TABLET(80 MG) BY MOUTH DAILY 90 tablet 3   Calcium Citrate-Vitamin D (CALCIUM CITRATE + PO) Take 1 tablet by mouth daily.     carboxymethylcellulose (REFRESH PLUS) 0.5 % SOLN Place 1 drop into both eyes 3 (three) times daily as needed (dry eyes).     Cholecalciferol (VITAMIN D3) 50 MCG (2000 UT) TABS Take 4,000 Units by mouth daily with lunch.     CINNAMON PO Take 1,000 mg by mouth daily.     clindamycin (CLEOCIN) 300 MG capsule Take 300 mg by mouth See admin instructions. 1 hour before dental procedures     Continuous Blood Gluc Sensor (FREESTYLE LIBRE 2 SENSOR) MISC 2 each by Does not apply route every 14 (fourteen) days. E11.40 6 each 3   cyanocobalamin (VITAMIN B12) 1000 MCG tablet Take 1,000 mcg by mouth daily.     dapagliflozin propanediol (FARXIGA) 10 MG TABS tablet Take 1 tablet (10 mg total) by mouth daily before breakfast. 90 tablet 3   denosumab (PROLIA) 60 MG/ML SOSY injection Inject 60 mg into the skin every 6 (six) months.     estradiol (ESTRACE) 0.1 MG/GM vaginal cream Place 1 Applicatorful vaginally 2 (two) times a week. As needed     ferrous sulfate 325 (65 FE) MG tablet Take 325 mg by mouth daily with lunch.     furosemide (LASIX) 40 MG tablet Take 1 tablet (40 mg total) by mouth daily. 90 tablet 3   GEMTESA 75 MG TABS Take 75 mg by mouth at bedtime.     Insulin Aspart FlexPen (NOVOLOG) 100 UNIT/ML Max daily 30 units (Patient taking differently: Inject 4-5 Units into the skin 3 (three) times daily with meals. Max daily 30 units) 15 mL 6   insulin glargine, 1 Unit Dial, (TOUJEO) 300 UNIT/ML Solostar Pen Inject 8 Units into the skin at bedtime. (Patient taking differently: Inject 6 Units into the skin at bedtime.) 15 mL 3    Insulin Pen Needle (BD PEN NEEDLE NANO 2ND GEN) 32G X 4 MM MISC USE TO INJECT INSULIN DAILY 200 each 3   isosorbide mononitrate (IMDUR) 30 MG 24 hr tablet 1 tablet by mouth daily, 2nd attempt, please reach arrange appt for further refills 90 tablet 3   lisinopril (ZESTRIL) 20 MG tablet TAKE 2 TABLETS(40 MG) BY MOUTH TWICE DAILY 180 tablet 3   magnesium oxide (MAG-OX) 400 (240 Mg) MG tablet TAKE 2 TABLETS(800 MG) BY MOUTH TWICE DAILY (Patient taking differently: Take 800 mg by mouth 2 (two) times daily.) 120 tablet 2   Multiple Vitamins-Minerals (PRESERVISION AREDS PO) Take 1  capsule by mouth 2 (two) times daily.     nystatin cream (MYCOSTATIN) Apply 1 Application topically 4 (four) times daily as needed for dry skin (yeast infection).     ondansetron (ZOFRAN-ODT) 8 MG disintegrating tablet Take 8 mg by mouth every 8 (eight) hours as needed for nausea or vomiting.      oxyCODONE-acetaminophen (PERCOCET/ROXICET) 5-325 MG tablet Take 1 tablet by mouth every 6 (six) hours as needed for moderate pain or severe pain. 30 tablet 0   pantoprazole (PROTONIX) 40 MG tablet TAKE 1 TABLET(40 MG) BY MOUTH EVERY MORNING 90 tablet 3   polyethylene glycol powder (MIRALAX) 17 GM/SCOOP powder Take 17 g by mouth daily as needed for moderate constipation.     pyridoxine (B-6) 100 MG tablet Take 100 mg by mouth daily.     sennosides-docusate sodium (SENOKOT-S) 8.6-50 MG tablet Take 1 tablet by mouth daily.     sodium chloride (OCEAN) 0.65 % SOLN nasal spray Place 1 spray into both nostrils as needed for congestion.     traMADol (ULTRAM) 50 MG tablet Take 50-100 mg by mouth every 6 (six) hours as needed.     traZODone (DESYREL) 50 MG tablet TAKE 1 TABLET(50 MG) BY MOUTH AT BEDTIME AS NEEDED FOR SLEEP 90 tablet 1   warfarin (COUMADIN) 1 MG tablet Take 1 mg by mouth every evening. Take with 5 mg tab to get 6 mg     warfarin (COUMADIN) 3 MG tablet Take 1 tablet (3 mg total) by mouth daily. 90 tablet 1   warfarin (COUMADIN) 4  MG tablet Take 1 tablet (4 mg total) by mouth daily. 90 tablet 1   warfarin (COUMADIN) 5 MG tablet Take 5 mg by mouth every evening. Take with 1 mg tab to get 6 mg     No current facility-administered medications on file prior to visit.   Past Medical History:  Diagnosis Date   Anticoagulated on Coumadin    chronic--- managed by hematology/ oncology,   Asthma, mild intermittent    followed by pcp--- per pt last exacerbation winter 2021 w/ acute bronchitis   Bilateral leg cramps    Blood dyscrasia    Chronic constipation    CKD (chronic kidney disease), stage III (HCC)    Closed bimalleolar fracture of left ankle 02/26/2020   DDD (degenerative disc disease), cervical    w/ spondylosis,  per pt last steroid injection 06/ 2022   DDD (degenerative disc disease), lumbosacral    DVT (deep venous thrombosis) (HCC) 07/30/2013   Dyspnea    occasionally   Dysrhythmia    GERD (gastroesophageal reflux disease)    History of cardiac murmur as a child    History of DVT of lower extremity    left lower extremity in 1980s, fell when bowling   History of pulmonary embolus (PE) 1993   per pt left lung post op 2 wks cholecystectomy   History of rheumatic fever as a child    per last echo 12-31-2019 no valvular issues   History of TIA (transient ischemic attack)    2014 and 2018 or 2019,  per pt no residual   HTN (hypertension)    followed by pcp   Hyperlipidemia    Hypothyroidism    IDA (iron deficiency anemia)    Macular degeneration of both eyes    Mild obstructive sleep apnea    per pt dx 2017 tried to uses cpap but intolerant   Mixed incontinence urge and stress    urologist---  dr Saddie Benders   OA (osteoarthritis) 07/06/2018   Osteoporosis    PAF (paroxysmal atrial fibrillation) (HCC) 10/01/2014   cardiologist--- dr Lavona Mound tobb;   cardiac cath 02-18-2013 normal coronaries arteries, ef 50%, cath done since echo showed ef 30-35%; nucleat stress study 04/ 2020 normal , normal echo 04/ 2021,   event monitor 09-14-2020 rare ST/AT variable block   Pneumonia    PONV (postoperative nausea and vomiting)    Protein S deficiency (HCC)    followed by hemotology/ oncology-- dr d. Melvyn Neth ([facility]) dx 1980s;  prior DVT left lower leg 1980s and left lung PE 1993; chronic  coumadin since 1980s   PVC's (premature ventricular contractions)    followed by cardiology   S/P cardiac catheterization 02/2013   Normal coronaries; low normal EF at 50%   Solitary pulmonary nodule on lung CT 02/06/2019   First noted 01/13/2014 > no change as of 12/21/2018   Spondylolisthesis, lumbar region 08/09/2018   Stroke (HCC)    TIA in 2018 or 2019   Transient ischemic attack 07/30/2013   Type 2 diabetes mellitus treated with insulin (HCC)    endocrilogist--- whitney reardon NP     (03-10-2021  pt continuously checks blood sugar throughout the day w/ Libre, fasting sugar --- 69--200)   Wears glasses    Wears hearing aid in both ears    Past Surgical History:  Procedure Laterality Date   ABDOMINAL HYSTERECTOMY     BLEPHAROPLASTY     CARDIAC CATHETERIZATION  02/18/2013   @ [facility]  by Dr [place];  normal coronaries w/ preserved LVF, ef 50%;   previous cath 2001 normal ef 65%   CARPAL TUNNEL RELEASE Bilateral 1994   CATARACT EXTRACTION W/ INTRAOCULAR LENS IMPLANT Bilateral 2017   CHOLECYSTECTOMY     CHOLECYSTECTOMY, LAPAROSCOPIC  1993   COLONOSCOPY     EAR BIOPSY Left    FINGER SURGERY Left 2018   thumb   FOOT SURGERY     FOOT TENDON SURGERY Right    early 2000s   FRACTURE SURGERY Left 02/13/2020   left femur   HAND SURGERY Right 04/2022   HARDWARE REMOVAL Left 03/12/2021   Procedure: HARDWARE REMOVAL;  Surgeon: Yolonda Kida, MD;  Location: [facility];  Service: Orthopedics;  Laterality: Left;  60 MINS   JOINT REPLACEMENT     LUMBAR LAMINECTOMY/DECOMPRESSION MICRODISCECTOMY N/A 12/22/2022   Procedure: Lumbar Two-Lumbar Three, Lumbar Three-Lumbar Four Lumbar  Decompression;  Surgeon: Barnett Abu, MD;  Location: [facility] OR;  Service: Neurosurgery;  Laterality: N/A;  RM 20 3C   ORIF ANKLE FRACTURE Left 02/26/2020   Procedure: OPEN REDUCTION INTERNAL FIXATION (ORIF) ANKLE FRACTURE;  Surgeon: Yolonda Kida, MD;  Location: [facility] OR;  Service: Orthopedics;  Laterality: Left;  90 mins   TONSILLECTOMY AND ADENOIDECTOMY Bilateral    TOTAL KNEE ARTHROPLASTY Left 03/2005   TOTAL VAGINAL HYSTERECTOMY  1988   per pt still has ovaries    Family History  Problem Relation Age of Onset   Cirrhosis Mother    Antithrombin III deficiency Mother        multiple emboli   Ulcerative colitis Mother    Diabetes Father    Coronary artery disease Father    Heart attack Father    Hypertension Father    Kidney disease Father    Hypertension Sister    Diabetes Sister    Heart attack Sister        age 9    Protein  S deficiency Daughter    Heart attack Brother    Hypertension Brother    Lung cancer Brother    Stroke Neg Hx    Colitis Neg Hx    Colon polyps Neg Hx    Esophageal cancer Neg Hx    Liver cancer Neg Hx    Pancreatic cancer Neg Hx    Rectal cancer Neg Hx    Stomach cancer Neg Hx    Breast cancer Neg Hx    Social History   Socioeconomic History   Marital status: Married    Spouse name: Jomarie Longs   Number of children: Not on file   Years of education: Not on file   Highest education level: Some college, no degree  Occupational History   Not on file  Tobacco Use   Smoking status: Never   Smokeless tobacco: Never  Vaping Use   Vaping status: Never Used  Substance and Sexual Activity   Alcohol use: No   Drug use: Never   Sexual activity: Not on file    Comment: Hysterectomy  Other Topics Concern   Not on file  Social History Narrative   Lives with spouse in [place].  2 grown daughters.   Retired Print production planner     Social Determinants of Health   Financial Resource Strain: Low Risk  (03/30/2023)   Overall Financial Resource Strain  (CARDIA)    Difficulty of Paying Living Expenses: Not hard at all  Food Insecurity: No Food Insecurity (03/30/2023)   Hunger Vital Sign    Worried About Running Out of Food in the Last Year: Never true    Ran Out of Food in the Last Year: Never true  Transportation Needs: No Transportation Needs (03/30/2023)   PRAPARE - Administrator, Civil Service (Medical): No    Lack of Transportation (Non-Medical): No  Physical Activity: Sufficiently Active (03/30/2023)   Exercise Vital Sign    Days of Exercise per Week: 7 days    Minutes of Exercise per Session: 30 min  Stress: No Stress Concern Present (03/30/2023)   [employer] of Occupational Health - Occupational Stress Questionnaire    Feeling of Stress : Not at all  Social Connections: Socially Integrated (03/30/2023)   Social Connection and Isolation Panel [NHANES]    Frequency of Communication with Friends and Family: More than three times a week    Frequency of Social Gatherings with Friends and Family: More than three times a week    Attends Religious Services: More than 4 times per year    Active Member of [employer] or Organizations: Yes    Attends Engineer, structural: More than 4 times per year    Marital Status: Married    Objective:  BP (!) 110/50   Pulse 78   Temp (!) 97 F (36.1 C)   Resp 16   Ht 5\' 1"  (1.549 m)   Wt 145 lb 9.6 oz (66 kg)   BMI 27.51 kg/m      04/17/2023    8:48 AM 03/30/2023    2:08 PM 03/27/2023    1:36 PM  BP/Weight  Systolic BP 110 -- 110  Diastolic BP 50 -- 60  Wt. (Lbs) 145.6 146 146.4  BMI 27.51 kg/m2 26.7 kg/m2 26.78 kg/m2    Physical Exam Vitals reviewed.  Constitutional:      Appearance: Normal appearance. She is normal weight.  HENT:     Right Ear: Tympanic membrane normal.     Left Ear: Tympanic  membrane normal.     Nose: No rhinorrhea.     Comments: Blood right nares.    Mouth/Throat:     Pharynx: No oropharyngeal exudate or posterior oropharyngeal erythema.   Neck:     Vascular: No carotid bruit.  Cardiovascular:     Rate and Rhythm: Normal rate and regular rhythm.     Heart sounds: Normal heart sounds.  Pulmonary:     Effort: Pulmonary effort is normal. No respiratory distress.     Breath sounds: Normal breath sounds.  Abdominal:     General: Abdomen is flat. Bowel sounds are normal.     Palpations: Abdomen is soft.     Tenderness: There is no abdominal tenderness.  Neurological:     Mental Status: She is alert and oriented to person, place, and time.  Psychiatric:        Mood and Affect: Mood normal.        Behavior: Behavior normal.     Diabetic Foot Exam - Simple   Simple Foot Form Diabetic Foot exam was performed with the following findings: Yes 04/17/2023  9:40 AM  Visual Inspection No deformities, no ulcerations, no other skin breakdown bilaterally: Yes Sensation Testing See comments: Yes Pulse Check Posterior Tibialis and Dorsalis pulse intact bilaterally: Yes Comments Decreased sensation BL.      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  Echo: 12/31/2019 Diastolic heart failure. Due FOR REPEAT ECHO IN 04/2023. No changes to medicines. Continue  Lisinopril 20 mg 2 daily, Amlodipine 5 mg daily, Aspirin 81 mg daily, Isosorbide 30 mg daily. Continue to work on eating a healthy diet and exercise.  Labs  reviewed today.     Gastroesophageal reflux disease without esophagitis Assessment & Plan: Well-controlled.  Continue protonix 40 mg daily.     Type 2 diabetes mellitus with diabetic neuropathy, with long-term current use of insulin (HCC) Assessment & Plan: Control: fair. Sees endocrinology.  Recommend check sugars before meals and before bed. Has CGM. Recommend check feet daily. Recommend annual eye exams. Medicines: Recommend increase NovoLog sliding scale to 6 units as a base  Continue to work on eating a healthy diet and exercise.  Labs reviewed today    Subclinical hypothyroidism Assessment & Plan: Continue on levothyroxine 25 mcg once daily  Check tsh and free T4 in 6 weeks.    Mixed hyperlipidemia Assessment & Plan: Well controlled.  No changes to medicines. Continue atorvastatin 80 mg before bed.  Continue to work on eating a healthy diet and exercise.  Labs reviewed   Acute cystitis with hematuria Assessment & Plan: Stop macrobid.  Start cipro 250 mg twice daily x 7 days.  Orders: -     POCT URINALYSIS  DIP (CLINITEK) -     Urine Culture  Acquired thrombophilia (HCC) Assessment & Plan: Secondary to coumadin for protein s deficiency and atrial fibrillation.    Paroxysmal atrial fibrillation (HCC) Assessment & Plan: Rate controlled. On Coumadin.   Muscle cramps -     T4, free -     TSH -     Phosphorus -     Magnesium     Meds ordered this encounter  Medications   DISCONTD: ciprofloxacin (CIPRO) 250 MG tablet    Sig: Take 1 tablet (250 mg total) by mouth 2 (two) times daily for 7 days.    Dispense:  14 tablet    Refill:  0    Orders Placed This Encounter  Procedures   Urine Culture   T4, free   TSH   Phosphorus   Magnesium   POCT URINALYSIS DIP (CLINITEK)     Follow-up: Return in about 3 months (around 07/18/2023) for chronic follow up.   I,Marla I Leal-Borjas,acting as a scribe for Blane Ohara, MD.,have documented all  relevant documentation on the behalf of Blane Ohara, MD,as directed by  Blane Ohara, MD while in the presence of Blane Ohara, MD.   An After Visit Summary was printed and given to the patient.  Blane Ohara, MD [facility] [phone]

## 2023-04-17 ENCOUNTER — Encounter: Payer: Self-pay | Admitting: Family Medicine

## 2023-04-17 ENCOUNTER — Telehealth: Payer: Self-pay

## 2023-04-17 ENCOUNTER — Ambulatory Visit: Payer: Medicare PPO | Admitting: Family Medicine

## 2023-04-17 VITALS — BP 110/50 | HR 78 | Temp 97.0°F | Resp 16 | Ht 61.0 in | Wt 145.6 lb

## 2023-04-17 DIAGNOSIS — I480 Paroxysmal atrial fibrillation: Secondary | ICD-10-CM

## 2023-04-17 DIAGNOSIS — N3001 Acute cystitis with hematuria: Secondary | ICD-10-CM

## 2023-04-17 DIAGNOSIS — E038 Other specified hypothyroidism: Secondary | ICD-10-CM | POA: Diagnosis not present

## 2023-04-17 DIAGNOSIS — E782 Mixed hyperlipidemia: Secondary | ICD-10-CM

## 2023-04-17 DIAGNOSIS — R252 Cramp and spasm: Secondary | ICD-10-CM

## 2023-04-17 DIAGNOSIS — K219 Gastro-esophageal reflux disease without esophagitis: Secondary | ICD-10-CM | POA: Diagnosis not present

## 2023-04-17 DIAGNOSIS — I110 Hypertensive heart disease with heart failure: Secondary | ICD-10-CM | POA: Diagnosis not present

## 2023-04-17 DIAGNOSIS — E1140 Type 2 diabetes mellitus with diabetic neuropathy, unspecified: Secondary | ICD-10-CM

## 2023-04-17 DIAGNOSIS — R300 Dysuria: Secondary | ICD-10-CM

## 2023-04-17 DIAGNOSIS — D6859 Other primary thrombophilia: Secondary | ICD-10-CM

## 2023-04-17 DIAGNOSIS — D6869 Other thrombophilia: Secondary | ICD-10-CM

## 2023-04-17 DIAGNOSIS — Z794 Long term (current) use of insulin: Secondary | ICD-10-CM

## 2023-04-17 DIAGNOSIS — I5030 Unspecified diastolic (congestive) heart failure: Secondary | ICD-10-CM

## 2023-04-17 HISTORY — DX: Dysuria: R30.0

## 2023-04-17 LAB — POCT URINALYSIS DIP (CLINITEK)
Bilirubin, UA: NEGATIVE
Glucose, UA: 250 mg/dL — AB
Nitrite, UA: NEGATIVE
POC PROTEIN,UA: NEGATIVE
Spec Grav, UA: 1.01 (ref 1.010–1.025)
Urobilinogen, UA: 0.2 E.U./dL
pH, UA: 6 (ref 5.0–8.0)

## 2023-04-17 MED ORDER — CIPROFLOXACIN HCL 250 MG PO TABS: 250.0000 mg | ORAL_TABLET | Freq: Two times a day (BID) | ORAL | 0 refills | Status: DC

## 2023-04-17 NOTE — Patient Instructions (Addendum)
Add-on Magnesium, Phos, TSH and Free T4 to previous labs. Eye exam is scheduled for August 22.    Stop macrobid.  Start cipro 250 mg twice daily x 7 days.  Increase Novolog SSI to 6 U with add on based on sugars.

## 2023-04-17 NOTE — Assessment & Plan Note (Signed)
Stop macrobid.  Start cipro 250 mg twice daily x 7 days.

## 2023-04-17 NOTE — Assessment & Plan Note (Signed)
Rate controlled. On Coumadin. 

## 2023-04-17 NOTE — Assessment & Plan Note (Signed)
Well controlled.  Continue protonix 40 mg daily.

## 2023-04-17 NOTE — Assessment & Plan Note (Signed)
>>  ASSESSMENT AND PLAN FOR ACUTE CYSTITIS WITH HEMATURIA WRITTEN ON 04/17/2023  9:48 AM BY COX, KIRSTEN, MD  Stop macrobid.  Start cipro 250 mg twice daily x 7 days.

## 2023-04-17 NOTE — Assessment & Plan Note (Addendum)
Well controlled.  Echo: 12/31/2019 Diastolic heart failure. Due FOR REPEAT ECHO IN 04/2023. No changes to medicines. Continue  Lisinopril 20 mg 2 daily, Amlodipine 5 mg daily, Aspirin 81 mg daily, Isosorbide 30 mg daily. Continue to work on eating a healthy diet and exercise.  Labs reviewed today.

## 2023-04-17 NOTE — Assessment & Plan Note (Signed)
Secondary to coumadin for protein s deficiency and atrial fibrillation.

## 2023-04-17 NOTE — Telephone Encounter (Signed)
Patient called stating that she was supposed to call and inform us of her PTINR and who manages it and what dose she is currently on. She states that Dr. Melvyn Neth Manages the [facility] and his recommended goal for her is 2-3 and today she checked it and it was 4.9. Dr. Melvyn Neth has already called her with instructions to Not take her warfarin today or tomorrow and then continue Warfarin 4 mg and recheck again on Next Monday 1 week from now.

## 2023-04-17 NOTE — Telephone Encounter (Signed)
Pt's INR is 4.5 this morning. She reports that she took Coumadin 5mg  everyday last week, as instructed by Kelli,PA.  I notified Dr Melvyn Neth of above. He recommends she hold Coumadin the next 2 days, restart Wed @ Coumadin 4mg  every day.

## 2023-04-19 ENCOUNTER — Encounter: Payer: Self-pay | Admitting: Hematology and Oncology

## 2023-04-20 LAB — URINE CULTURE

## 2023-04-21 ENCOUNTER — Other Ambulatory Visit: Payer: Self-pay | Admitting: Family Medicine

## 2023-04-24 ENCOUNTER — Other Ambulatory Visit: Payer: Self-pay

## 2023-04-24 DIAGNOSIS — D6859 Other primary thrombophilia: Secondary | ICD-10-CM | POA: Diagnosis not present

## 2023-04-24 DIAGNOSIS — Z7901 Long term (current) use of anticoagulants: Secondary | ICD-10-CM | POA: Diagnosis not present

## 2023-04-24 MED ORDER — SULFAMETHOXAZOLE-TRIMETHOPRIM 800-160 MG PO TABS: 1.0000 | ORAL_TABLET | Freq: Two times a day (BID) | ORAL | 0 refills | Status: DC

## 2023-04-25 ENCOUNTER — Ambulatory Visit (HOSPITAL_COMMUNITY): Payer: Medicare PPO | Attending: Cardiology

## 2023-04-25 ENCOUNTER — Telehealth: Payer: Self-pay | Admitting: Hematology and Oncology

## 2023-04-25 DIAGNOSIS — R0609 Other forms of dyspnea: Secondary | ICD-10-CM | POA: Diagnosis not present

## 2023-04-25 LAB — ECHOCARDIOGRAM COMPLETE
Area-P 1/2: 3.93 cm2
S' Lateral: 3.6 cm

## 2023-04-25 NOTE — Telephone Encounter (Signed)
Telephoned patient regarding home INR of 1.6 on 8/19 and 5 mg daily.  INR was 4.9 on 8/12 Coumadin was held for 2 days then resumed at 4 mg daily.  I will have her alternate 4 mg Monday Wednesday Friday and 5 mg the rest of the days.  Repeat home INR in 1 week the patient expresses understanding.

## 2023-04-26 ENCOUNTER — Telehealth: Payer: Self-pay

## 2023-04-27 DIAGNOSIS — H353132 Nonexudative age-related macular degeneration, bilateral, intermediate dry stage: Secondary | ICD-10-CM | POA: Diagnosis not present

## 2023-04-30 NOTE — Addendum Note (Signed)
Addended byBlane Ohara on: 04/30/2023 07:04 PM   Modules accepted: Level of Service

## 2023-05-01 ENCOUNTER — Telehealth: Payer: Self-pay

## 2023-05-01 NOTE — Telephone Encounter (Signed)
Pt called, her INR today is 3.0. She is currently taking 5mg  every day on T,Th and Sat; 4mg  on Sun, Mon, Wed, and Friday.

## 2023-05-07 ENCOUNTER — Other Ambulatory Visit: Payer: Self-pay | Admitting: Family Medicine

## 2023-05-07 ENCOUNTER — Other Ambulatory Visit: Payer: Self-pay | Admitting: Hematology and Oncology

## 2023-05-07 DIAGNOSIS — K219 Gastro-esophageal reflux disease without esophagitis: Secondary | ICD-10-CM

## 2023-05-09 ENCOUNTER — Encounter: Payer: Self-pay | Admitting: Hematology and Oncology

## 2023-05-09 ENCOUNTER — Telehealth: Payer: Self-pay | Admitting: Hematology and Oncology

## 2023-05-09 NOTE — Telephone Encounter (Signed)
Left message for patient to call regarding home INR of 1.8.

## 2023-05-09 NOTE — Telephone Encounter (Signed)
Patient returned call.  She states she is taking 5 mg Monday Wednesday and Friday and 4 mg the rest of the days.  Her INR at home was 1.8.  It had been 3 last week, but she had been on antibiotic.  I will increase her Coumadin to 4 mg Monday Wednesday Friday and 5 mg the rest of the days.  She will repeat a home INR next week.  Patient expressed understanding.

## 2023-05-12 ENCOUNTER — Encounter (HOSPITAL_COMMUNITY): Payer: Self-pay

## 2023-05-15 ENCOUNTER — Telehealth: Payer: Self-pay

## 2023-05-15 LAB — POCT INR: POC INR: 1.6

## 2023-05-15 NOTE — Telephone Encounter (Signed)
Pt called to report her INR 1.6. She is currently taking coumadin 5mg  po every day 4 days/wk, and coumadin 4mg  po every day for 3 days/wk.

## 2023-05-16 ENCOUNTER — Encounter (HOSPITAL_COMMUNITY)
Admission: RE | Admit: 2023-05-16 | Discharge: 2023-05-16 | Disposition: A | Discharge status: Home / Self Care | Payer: Medicare PPO | Source: Ambulatory Visit | Attending: Student | Admitting: Student

## 2023-05-16 DIAGNOSIS — R0789 Other chest pain: Secondary | ICD-10-CM | POA: Diagnosis not present

## 2023-05-16 DIAGNOSIS — R918 Other nonspecific abnormal finding of lung field: Secondary | ICD-10-CM | POA: Diagnosis not present

## 2023-05-16 DIAGNOSIS — N390 Urinary tract infection, site not specified: Secondary | ICD-10-CM | POA: Diagnosis not present

## 2023-05-16 DIAGNOSIS — I2510 Atherosclerotic heart disease of native coronary artery without angina pectoris: Secondary | ICD-10-CM | POA: Diagnosis not present

## 2023-05-16 DIAGNOSIS — I700 Atherosclerosis of aorta: Secondary | ICD-10-CM | POA: Diagnosis not present

## 2023-05-16 LAB — NM PET CT CARDIAC PERFUSION MULTI W/ABSOLUTE BLOODFLOW
MBFR: 1.48
Nuc Rest EF: 54 %
Nuc Stress EF: 65 %
Rest MBF: 1.57 ml/g/min
Rest Nuclear Isotope Dose: 17.1 mCi
ST Depression (mm): 0 mm
Stress MBF: 2.32 ml/g/min
Stress Nuclear Isotope Dose: 17.1 mCi
TID: 1

## 2023-05-16 MED ORDER — REGADENOSON 0.4 MG/5ML IV SOLN
0.4000 mg | Freq: Once | INTRAVENOUS | Status: AC
  Administered 2023-05-16: 0.4 mg via INTRAVENOUS

## 2023-05-16 MED ORDER — CAFFEINE CITRATE BASE COMPONENT 10 MG/ML IV SOLN
INTRAVENOUS | Status: AC
  Filled 2023-05-16: qty 3

## 2023-05-16 MED ORDER — RUBIDIUM RB82 GENERATOR (RUBYFILL)
17.0900 | PACK | Freq: Once | INTRAVENOUS | Status: AC
  Administered 2023-05-16: 17.09 via INTRAVENOUS

## 2023-05-16 MED ORDER — REGADENOSON 0.4 MG/5ML IV SOLN
INTRAVENOUS | Status: AC
  Filled 2023-05-16: qty 5

## 2023-05-16 MED ORDER — RUBIDIUM RB82 GENERATOR (RUBYFILL)
17.1000 | PACK | Freq: Once | INTRAVENOUS | Status: AC
  Administered 2023-05-16: 17.1 via INTRAVENOUS

## 2023-05-16 NOTE — Progress Notes (Signed)
Pt did not take any of her medication this am.  She had back surgery in the past andf has pain/

## 2023-05-16 NOTE — Progress Notes (Signed)
Tolerated test well.  Did have headache.  Pt drinking coffee.Pt has Libre number for replacement given to patient

## 2023-05-18 ENCOUNTER — Other Ambulatory Visit: Payer: Self-pay

## 2023-05-18 MED ORDER — BD PEN NEEDLE NANO 2ND GEN 32G X 4 MM MISC: 3 refills | Status: DC

## 2023-05-22 ENCOUNTER — Telehealth: Payer: Self-pay

## 2023-05-22 DIAGNOSIS — E785 Hyperlipidemia, unspecified: Secondary | ICD-10-CM | POA: Diagnosis not present

## 2023-05-22 DIAGNOSIS — J45909 Unspecified asthma, uncomplicated: Secondary | ICD-10-CM | POA: Diagnosis not present

## 2023-05-22 DIAGNOSIS — Z888 Allergy status to other drugs, medicaments and biological substances status: Secondary | ICD-10-CM | POA: Diagnosis not present

## 2023-05-22 DIAGNOSIS — Z886 Allergy status to analgesic agent status: Secondary | ICD-10-CM | POA: Diagnosis not present

## 2023-05-22 DIAGNOSIS — M1990 Unspecified osteoarthritis, unspecified site: Secondary | ICD-10-CM | POA: Diagnosis not present

## 2023-05-22 DIAGNOSIS — E039 Hypothyroidism, unspecified: Secondary | ICD-10-CM | POA: Diagnosis not present

## 2023-05-22 DIAGNOSIS — I2510 Atherosclerotic heart disease of native coronary artery without angina pectoris: Secondary | ICD-10-CM | POA: Diagnosis not present

## 2023-05-22 DIAGNOSIS — K219 Gastro-esophageal reflux disease without esophagitis: Secondary | ICD-10-CM | POA: Diagnosis not present

## 2023-05-22 DIAGNOSIS — M2040 Other hammer toe(s) (acquired), unspecified foot: Secondary | ICD-10-CM | POA: Diagnosis not present

## 2023-05-22 DIAGNOSIS — Z7951 Long term (current) use of inhaled steroids: Secondary | ICD-10-CM | POA: Diagnosis not present

## 2023-05-22 DIAGNOSIS — E1122 Type 2 diabetes mellitus with diabetic chronic kidney disease: Secondary | ICD-10-CM | POA: Diagnosis not present

## 2023-05-22 DIAGNOSIS — Z86718 Personal history of other venous thrombosis and embolism: Secondary | ICD-10-CM | POA: Diagnosis not present

## 2023-05-22 DIAGNOSIS — Z7901 Long term (current) use of anticoagulants: Secondary | ICD-10-CM | POA: Diagnosis not present

## 2023-05-22 DIAGNOSIS — Z86711 Personal history of pulmonary embolism: Secondary | ICD-10-CM | POA: Diagnosis not present

## 2023-05-22 DIAGNOSIS — G4700 Insomnia, unspecified: Secondary | ICD-10-CM | POA: Diagnosis not present

## 2023-05-22 DIAGNOSIS — M4800 Spinal stenosis, site unspecified: Secondary | ICD-10-CM | POA: Diagnosis not present

## 2023-05-22 DIAGNOSIS — Z87892 Personal history of anaphylaxis: Secondary | ICD-10-CM | POA: Diagnosis not present

## 2023-05-22 DIAGNOSIS — M810 Age-related osteoporosis without current pathological fracture: Secondary | ICD-10-CM | POA: Diagnosis not present

## 2023-05-22 LAB — POCT INR: POC INR: 4.2

## 2023-05-22 NOTE — Telephone Encounter (Addendum)
Pt LVM on nurse line reporting her INR is 4.2 today. She is taking coumadin 5mg  po every day per Dr Melvyn Neth' instruction from last week. Pt wanted to suggest, "leave me on coumadin 5mg  everyday for another week and see if it comes down on its own. I am on an antibiotic for UTI. Let me know what the doctor says".   Dr Melvyn Neth notified of above. He recommends she "hold coumadin for 1 dose, then restart".

## 2023-05-23 ENCOUNTER — Encounter: Payer: Self-pay | Admitting: Oncology

## 2023-05-24 DIAGNOSIS — E1122 Type 2 diabetes mellitus with diabetic chronic kidney disease: Secondary | ICD-10-CM | POA: Diagnosis not present

## 2023-05-24 DIAGNOSIS — E1165 Type 2 diabetes mellitus with hyperglycemia: Secondary | ICD-10-CM | POA: Diagnosis not present

## 2023-05-29 ENCOUNTER — Telehealth: Payer: Self-pay

## 2023-05-29 DIAGNOSIS — Z7901 Long term (current) use of anticoagulants: Secondary | ICD-10-CM | POA: Diagnosis not present

## 2023-05-29 DIAGNOSIS — D6859 Other primary thrombophilia: Secondary | ICD-10-CM | POA: Diagnosis not present

## 2023-05-29 NOTE — Telephone Encounter (Signed)
Pt called, reported that her INR is 4.7 this morning. I notified Dr Melvyn Neth, he recommends she HOLD coumadin for 2 days, then restart @ coumadin 4mg  po every day.

## 2023-06-02 ENCOUNTER — Other Ambulatory Visit: Payer: Self-pay | Admitting: Nurse Practitioner

## 2023-06-02 ENCOUNTER — Other Ambulatory Visit: Payer: Self-pay | Admitting: Family Medicine

## 2023-06-02 DIAGNOSIS — Z794 Long term (current) use of insulin: Secondary | ICD-10-CM

## 2023-06-05 ENCOUNTER — Encounter: Payer: Self-pay | Admitting: Internal Medicine

## 2023-06-05 ENCOUNTER — Telehealth: Payer: Self-pay

## 2023-06-05 ENCOUNTER — Ambulatory Visit (INDEPENDENT_AMBULATORY_CARE_PROVIDER_SITE_OTHER): Payer: Medicare PPO | Admitting: Internal Medicine

## 2023-06-05 VITALS — BP 110/66 | HR 50 | Ht 61.0 in | Wt 148.0 lb

## 2023-06-05 DIAGNOSIS — E1140 Type 2 diabetes mellitus with diabetic neuropathy, unspecified: Secondary | ICD-10-CM

## 2023-06-05 DIAGNOSIS — Z794 Long term (current) use of insulin: Secondary | ICD-10-CM

## 2023-06-05 DIAGNOSIS — E1122 Type 2 diabetes mellitus with diabetic chronic kidney disease: Secondary | ICD-10-CM | POA: Diagnosis not present

## 2023-06-05 DIAGNOSIS — N1832 Chronic kidney disease, stage 3b: Secondary | ICD-10-CM

## 2023-06-05 MED ORDER — INSULIN GLARGINE (1 UNIT DIAL) 300 UNIT/ML ~~LOC~~ SOPN: 6.0000 [IU] | PEN_INJECTOR | Freq: Every day | SUBCUTANEOUS | 3 refills | Status: DC

## 2023-06-05 MED ORDER — NOVOLOG FLEXPEN 100 UNIT/ML ~~LOC~~ SOPN: PEN_INJECTOR | SUBCUTANEOUS | 3 refills | Status: DC

## 2023-06-05 MED ORDER — DAPAGLIFLOZIN PROPANEDIOL 10 MG PO TABS: 10.0000 mg | ORAL_TABLET | Freq: Every day | ORAL | 3 refills | Status: DC

## 2023-06-05 MED ORDER — FREESTYLE LIBRE 2 SENSOR MISC: 2.0000 | 3 refills | Status: DC

## 2023-06-05 MED ORDER — TRULICITY 0.75 MG/0.5ML ~~LOC~~ SOAJ: 0.7500 mg | SUBCUTANEOUS | 3 refills | Status: DC

## 2023-06-05 NOTE — Progress Notes (Signed)
Name: Pamela [name]  MRN/ DOB: [id number], 1946-05-29   Age/ Sex: 77 y.o., female    PCP: Blane Ohara, MD   Reason for Endocrinology Evaluation: Type 2 Diabetes Mellitus     Date of Initial Endocrinology Visit: 03/30/2022    PATIENT IDENTIFIER: Pamela [name] is a 77 y.o. female with a past medical history of T2DM, GERD, A. Fib . The patient presented for initial endocrinology clinic visit on 06/05/2023 for consultative assistance with her diabetes management.    HPI: Pamela [name] was    Diagnosed with DM in 1985 Prior Medications tried/Intolerance: Metformin - tired .Ozempic - diarrhea , Rybelsus caused nausea  Hemoglobin A1c has ranged from 7.2% in 2023, peaking at 9.8% in 2022.   Of note, the pt was seen by Dr. Everardo All in 2021 for hyponatremia . Adrenal insufficiency was excluded    She was started on Farxiga in July 2023  SUBJECTIVE:   During the last visit (03/27/2023): A1c 7.6%  Today (06/05/23): Pamela [name] is here for a follow up on diabetes management. She checks her blood sugars multiple times daily through CGM. The patient has had hypoglycemic episodes since the last clinic visit, she is symptomatic    Patient had a follow-up with cardiology 03/2023 for dyspnea on exertion, and CAD, PET cardiac scan showed minimal cardiac calcifications, lung imaging showed fibrosis She is s/p back surgery 12/2022 Denies nausea, vomiting or diarrhea       HOME DIABETES REGIMEN: Farxiga 10 mg daily Toujeo  8 units daily  Novolog 4 /4/5 units Correction factor: NovoLog (BG -120/50)     Statin: yes ACE-I/ARB: yes    CONTINUOUS GLUCOSE MONITORING RECORD INTERPRETATION    Dates of Recording: 9/17-9/30/2024  Sensor description: [employer]  Results statistics:   CGM use % of time 86  Average and SD 173/32.2  Time in range 62 %  % Time Above 180 28  % Time above 250 10  % Time Below target 0   Glycemic patterns summary: BGs are  optimal overnight and trend up during the day  Hyperglycemic episodes post prandial  Hypoglycemic episodes occurred early morning  Overnight periods: stable     DIABETIC COMPLICATIONS: Microvascular complications:  Macular degeneration , CKD III Denies:  Last eye exam: Completed 09/2021  Macrovascular complications:   Denies: CAD, PVD, CVA   PAST HISTORY: Past Medical History:  Past Medical History:  Diagnosis Date   Anticoagulated on Coumadin    chronic--- managed by hematology/ oncology,   Asthma, mild intermittent    followed by pcp--- per pt last exacerbation winter 2021 w/ acute bronchitis   Bilateral leg cramps    Blood dyscrasia    Chronic constipation    CKD (chronic kidney disease), stage III (HCC)    Closed bimalleolar fracture of left ankle 02/26/2020   DDD (degenerative disc disease), cervical    w/ spondylosis,  per pt last steroid injection 06/ 2022   DDD (degenerative disc disease), lumbosacral    DVT (deep venous thrombosis) (HCC) 07/30/2013   Dyspnea    occasionally   Dysrhythmia    GERD (gastroesophageal reflux disease)    History of cardiac murmur as a child    History of DVT of lower extremity    left lower extremity in 1980s, fell when bowling   History of pulmonary embolus (PE) 1993   per pt left lung post op 2 wks cholecystectomy   History of rheumatic fever as a child  per last echo 12-31-2019 no valvular issues   History of TIA (transient ischemic attack)    2014 and 2018 or 2019,  per pt no residual   HTN (hypertension)    followed by pcp   Hyperlipidemia    Hypothyroidism    IDA (iron deficiency anemia)    Macular degeneration of both eyes    Mild obstructive sleep apnea    per pt dx 2017 tried to uses cpap but intolerant   Mixed incontinence urge and stress    urologist--- dr Saddie Benders   OA (osteoarthritis) 07/06/2018   Osteoporosis    PAF (paroxysmal atrial fibrillation) (HCC) 10/01/2014   cardiologist--- dr Lavona Mound tobb;    cardiac cath 02-18-2013 normal coronaries arteries, ef 50%, cath done since echo showed ef 30-35%; nucleat stress study 04/ 2020 normal , normal echo 04/ 2021,  event monitor 09-14-2020 rare ST/AT variable block   Pneumonia    PONV (postoperative nausea and vomiting)    Protein S deficiency (HCC)    followed by hemotology/ oncology-- dr d. Melvyn Neth ([facility]) dx 1980s;  prior DVT left lower leg 1980s and left lung PE 1993; chronic  coumadin since 1980s   PVC's (premature ventricular contractions)    followed by cardiology   S/P cardiac catheterization 02/2013   Normal coronaries; low normal EF at 50%   Solitary pulmonary nodule on lung CT 02/06/2019   First noted 01/13/2014 > no change as of 12/21/2018   Spondylolisthesis, lumbar region 08/09/2018   Stroke (HCC)    TIA in 2018 or 2019   Transient ischemic attack 07/30/2013   Type 2 diabetes mellitus treated with insulin (HCC)    endocrilogist--- whitney reardon NP     (03-10-2021  pt continuously checks blood sugar throughout the day w/ Libre, fasting sugar --- 69--200)   Wears glasses    Wears hearing aid in both ears    Past Surgical History:  Past Surgical History:  Procedure Laterality Date   ABDOMINAL HYSTERECTOMY     BLEPHAROPLASTY     CARDIAC CATHETERIZATION  02/18/2013   @ [facility]  by Dr [place];  normal coronaries w/ preserved LVF, ef 50%;   previous cath 2001 normal ef 65%   CARPAL TUNNEL RELEASE Bilateral 1994   CATARACT EXTRACTION W/ INTRAOCULAR LENS IMPLANT Bilateral 2017   CHOLECYSTECTOMY     CHOLECYSTECTOMY, LAPAROSCOPIC  1993   COLONOSCOPY     EAR BIOPSY Left    FINGER SURGERY Left 2018   thumb   FOOT SURGERY     FOOT TENDON SURGERY Right    early 2000s   FRACTURE SURGERY Left 02/13/2020   left femur   HAND SURGERY Right 04/2022   HARDWARE REMOVAL Left 03/12/2021   Procedure: HARDWARE REMOVAL;  Surgeon: Yolonda Kida, MD;  Location: [facility];  Service: Orthopedics;   Laterality: Left;  60 MINS   JOINT REPLACEMENT     LUMBAR LAMINECTOMY/DECOMPRESSION MICRODISCECTOMY N/A 12/22/2022   Procedure: Lumbar Two-Lumbar Three, Lumbar Three-Lumbar Four Lumbar Decompression;  Surgeon: Barnett Abu, MD;  Location: [facility] OR;  Service: Neurosurgery;  Laterality: N/A;  RM 20 3C   ORIF ANKLE FRACTURE Left 02/26/2020   Procedure: OPEN REDUCTION INTERNAL FIXATION (ORIF) ANKLE FRACTURE;  Surgeon: Yolonda Kida, MD;  Location: [facility] OR;  Service: Orthopedics;  Laterality: Left;  90 mins   TONSILLECTOMY AND ADENOIDECTOMY Bilateral    TOTAL KNEE ARTHROPLASTY Left 03/2005   TOTAL VAGINAL HYSTERECTOMY  1988   per pt still has  ovaries    Social History:  reports that she has never smoked. She has never used smokeless tobacco. She reports that she does not drink alcohol and does not use drugs. Family History:  Family History  Problem Relation Age of Onset   Cirrhosis Mother    Antithrombin III deficiency Mother        multiple emboli   Ulcerative colitis Mother    Diabetes Father    Coronary artery disease Father    Heart attack Father    Hypertension Father    Kidney disease Father    Hypertension Sister    Diabetes Sister    Heart attack Sister        age 74    Protein S deficiency Daughter    Heart attack Brother    Hypertension Brother    Lung cancer Brother    Stroke Neg Hx    Colitis Neg Hx    Colon polyps Neg Hx    Esophageal cancer Neg Hx    Liver cancer Neg Hx    Pancreatic cancer Neg Hx    Rectal cancer Neg Hx    Stomach cancer Neg Hx    Breast cancer Neg Hx      HOME MEDICATIONS: Allergies as of 06/05/2023       Reactions   Ketek [telithromycin] Nausea And Vomiting, Rash   Loxapine Succinate Hives   Naproxen Sodium Anaphylaxis, Hives   Amoxapine And Related Hives   Darvon Nausea And Vomiting   Belsomra [suvorexant] Other (See Comments)   "Nightmares"   Cymbalta [duloxetine Hcl] Other (See Comments)   "Blackouts"   Duloxetine     Gabapentin Other (See Comments)   Blurry vision   Lyrica [pregabalin]    Makes her sedated   Ozempic (0.25 Or 0.5 Mg-dose) [semaglutide(0.25 Or 0.5mg -dos)] Diarrhea   Semaglutide Diarrhea   Rybelsus   Amoxicillin Rash   Propoxyphene Nausea And Vomiting        Medication List        Accurate as of June 05, 2023  9:59 AM. If you have any questions, ask your nurse or doctor.          acetaminophen 650 MG CR tablet Commonly known as: TYLENOL Take 650-1,300 mg by mouth every 8 (eight) hours as needed for pain.   albuterol 108 (90 Base) MCG/ACT inhaler Commonly known as: VENTOLIN HFA INHALE 1 TO 2 PUFFS INTO THE LUNGS EVERY 6 HOURS AS NEEDED FOR WHEEZING OR SHORTNESS OF BREATH   amLODipine 5 MG tablet Commonly known as: NORVASC TAKE 1 TABLET(5 MG) BY MOUTH DAILY   Aspirin Low Dose 81 MG tablet Generic drug: aspirin EC TAKE 1 TABLET(81 MG) BY MOUTH DAILY   atorvastatin 80 MG tablet Commonly known as: LIPITOR TAKE 1 TABLET(80 MG) BY MOUTH DAILY   BD Pen Needle Nano 2nd Gen 32G X 4 MM Misc Generic drug: Insulin Pen Needle USE TO INJECT INSULIN DAILY   CALCIUM CITRATE + PO Take 1 tablet by mouth daily.   carboxymethylcellulose 0.5 % Soln Commonly known as: REFRESH PLUS Place 1 drop into both eyes 3 (three) times daily as needed (dry eyes).   cefdinir 300 MG capsule Commonly known as: OMNICEF Take 300 mg by mouth 2 (two) times daily.   CINNAMON PO Take 1,000 mg by mouth daily.   clindamycin 300 MG capsule Commonly known as: CLEOCIN Take 300 mg by mouth See admin instructions. 1 hour before dental procedures   cyanocobalamin 1000 MCG tablet Commonly  known as: VITAMIN B12 Take 1,000 mcg by mouth daily.   dapagliflozin propanediol 10 MG Tabs tablet Commonly known as: Farxiga Take 1 tablet (10 mg total) by mouth daily before breakfast.   estradiol 0.1 MG/GM vaginal cream Commonly known as: ESTRACE Place 1 Applicatorful vaginally 2 (two) times a week.  As needed   ferrous sulfate 325 (65 FE) MG tablet Take 325 mg by mouth daily with lunch.   fluconazole 150 MG tablet Commonly known as: DIFLUCAN Take by mouth.   FreeStyle Libre 2 Sensor Misc 2 each by Does not apply route every 14 (fourteen) days. E11.40   furosemide 40 MG tablet Commonly known as: LASIX Take 1 tablet (40 mg total) by mouth daily.   Gemtesa 75 MG Tabs Generic drug: Vibegron Take 75 mg by mouth at bedtime.   Insulin Aspart FlexPen 100 UNIT/ML Commonly known as: NOVOLOG Max daily 30 units What changed:  how much to take how to take this when to take this   insulin glargine (1 Unit Dial) 300 UNIT/ML Solostar Pen Commonly known as: TOUJEO Inject 8 Units into the skin at bedtime. What changed: how much to take   isosorbide mononitrate 30 MG 24 hr tablet Commonly known as: IMDUR 1 tablet by mouth daily, 2nd attempt, please reach arrange appt for further refills   lisinopril 20 MG tablet Commonly known as: ZESTRIL TAKE 2 TABLETS(40 MG) BY MOUTH TWICE DAILY   magnesium oxide 400 (240 Mg) MG tablet Commonly known as: MAG-OX TAKE 2 TABLETS(800 MG) BY MOUTH TWICE DAILY What changed: See the new instructions.   methocarbamol 750 MG tablet Commonly known as: ROBAXIN Take 750 mg by mouth at bedtime.   methocarbamol 500 MG tablet Commonly known as: ROBAXIN Take by mouth.   MiraLax 17 GM/SCOOP powder Generic drug: polyethylene glycol powder Take 17 g by mouth daily as needed for moderate constipation.   nitrofurantoin (macrocrystal-monohydrate) 100 MG capsule Commonly known as: MACROBID Take 100 mg by mouth 2 (two) times daily.   nystatin cream Commonly known as: MYCOSTATIN Apply 1 Application topically 4 (four) times daily as needed for dry skin (yeast infection).   ondansetron 8 MG disintegrating tablet Commonly known as: ZOFRAN-ODT Take 8 mg by mouth every 8 (eight) hours as needed for nausea or vomiting.   oxyCODONE-acetaminophen 5-325 MG  tablet Commonly known as: PERCOCET/ROXICET Take 1 tablet by mouth every 6 (six) hours as needed for moderate pain or severe pain.   pantoprazole 40 MG tablet Commonly known as: PROTONIX TAKE 1 TABLET(40 MG) BY MOUTH EVERY MORNING   PRESERVISION AREDS PO Take 1 capsule by mouth 2 (two) times daily.   Prolia 60 MG/ML Sosy injection Generic drug: denosumab Inject 60 mg into the skin every 6 (six) months.   pyridoxine 100 MG tablet Commonly known as: B-6 Take 100 mg by mouth daily.   sennosides-docusate sodium 8.6-50 MG tablet Commonly known as: SENOKOT-S Take 1 tablet by mouth daily.   sodium chloride 0.65 % Soln nasal spray Commonly known as: OCEAN Place 1 spray into both nostrils as needed for congestion.   traMADol 50 MG tablet Commonly known as: ULTRAM Take 50-100 mg by mouth every 6 (six) hours as needed.   traZODone 50 MG tablet Commonly known as: DESYREL TAKE 1 TABLET(50 MG) BY MOUTH AT BEDTIME AS NEEDED FOR SLEEP   Vitamin D3 50 MCG (2000 UT) Tabs Take 4,000 Units by mouth daily with lunch.   warfarin 1 MG tablet Commonly known as: COUMADIN Take as  directed by the anticoagulation clinic. If you are unsure how to take this medication, talk to your nurse or doctor. Original instructions: Take 1 mg by mouth every evening. Take with 5 mg tab to get 6 mg   warfarin 4 MG tablet Commonly known as: Coumadin Take as directed by the anticoagulation clinic. If you are unsure how to take this medication, talk to your nurse or doctor. Original instructions: Take 1 tablet (4 mg total) by mouth daily.   warfarin 3 MG tablet Commonly known as: COUMADIN Take as directed by the anticoagulation clinic. If you are unsure how to take this medication, talk to your nurse or doctor. Original instructions: Take 1 tablet (3 mg total) by mouth daily.   warfarin 5 MG tablet Commonly known as: COUMADIN Take as directed by the anticoagulation clinic. If you are unsure how to take this  medication, talk to your nurse or doctor. Original instructions: TAKE 1 TABLET BY MOUTH EVERY DAY         ALLERGIES: Allergies  Allergen Reactions   Ketek [Telithromycin] Nausea And Vomiting and Rash   Loxapine Succinate Hives   Naproxen Sodium Anaphylaxis and Hives   Amoxapine And Related Hives   Darvon Nausea And Vomiting   Belsomra [Suvorexant] Other (See Comments)    "Nightmares"   Cymbalta [Duloxetine Hcl] Other (See Comments)    "Blackouts"   Duloxetine    Gabapentin Other (See Comments)    Blurry vision   Lyrica [Pregabalin]     Makes her sedated   Ozempic (0.25 Or 0.5 Mg-Dose) [Semaglutide(0.25 Or 0.5mg -Dos)] Diarrhea   Semaglutide Diarrhea    Rybelsus   Amoxicillin Rash   Propoxyphene Nausea And Vomiting     REVIEW OF SYSTEMS: A comprehensive ROS was conducted with the patient and is negative except as per HPI     OBJECTIVE:   VITAL SIGNS: BP 110/66 (BP Location: Left Arm, Patient Position: Sitting, Cuff Size: Small)   Pulse (!) 50   Ht 5\' 1"  (1.549 m)   Wt 148 lb (67.1 kg)   SpO2 94%   BMI 27.96 kg/m    PHYSICAL EXAM:  General: Pt appears well and is in NAD  Lungs: Clear with good BS bilat   Heart: RRR   Extremities:  Lower extremities - No pretibial edema.   Neuro: MS is good with appropriate affect, pt is alert and Ox3    DM Foot Exam 06/05/2023  The skin of the feet is intact without sores or ulcerations. The pedal pulses are 2+ on right and 2+ on left. The sensation is decreased  to a screening 5.07, 10 gram monofilament bilaterally at the toes     DATA REVIEWED:  Lab Results  Component Value Date   HGBA1C 7.5 (H) 04/13/2023   HGBA1C 6.8 (H) 12/12/2022   HGBA1C 7.3 (H) 11/16/2022    Latest Reference Range & Units 04/13/23 08:07  Sodium 134 - 144 mmol/L 138  Potassium 3.5 - 5.2 mmol/L 4.6  Chloride 96 - 106 mmol/L 96  CO2 20 - 29 mmol/L 26  Glucose 70 - 99 mg/dL 161 (H)  BUN 8 - 27 mg/dL 27  Creatinine 0.96 - 0.45 mg/dL 4.09  (H)  Calcium 8.7 - 10.3 mg/dL 81.1  BUN/Creatinine Ratio 12 - 28  26  eGFR >59 mL/min/1.73 55 (L)  Alkaline Phosphatase 44 - 121 IU/L 108  Albumin 3.8 - 4.8 g/dL 4.4  AST 0 - 40 IU/L 28  ALT 0 - 32 IU/L 26  Total Protein 6.0 - 8.5 g/dL 6.9  Total Bilirubin 0.0 - 1.2 mg/dL 0.4    Latest Reference Range & Units 04/13/23 08:07  Total CHOL/HDL Ratio 0.0 - 4.4 ratio 1.9  Cholesterol, Total 100 - 199 mg/dL 932  HDL Cholesterol >35 mg/dL 79  Triglycerides 0 - 573 mg/dL 67  VLDL Cholesterol Cal 5 - 40 mg/dL 13  LDL Chol Calc (NIH) 0 - 99 mg/dL 62      ASSESSMENT / PLAN / RECOMMENDATIONS:   1) Type 2 Diabetes Mellitus, Optimally controlled, With CKD III complications - Most recent A1c of 7.5%. Goal A1c < 7.5 %.    -Her A1c is stable -Patient has been noted with hypoglycemia early in the morning but her BG will spike up from 68 Mg/DL to 220 without eating?,  I did advise the patient to use fingersticks when her glucose does not make sense as this CGM sensors have been noted with some inaccuracy. -Patient endorses hypoglycemia after meals as well, I will change her sensitivity factor from 50 to 55 -Labs at outside facility from August 2024 were reviewed -She has reported intolerance to Ozempic and Rybelsus, I did recommend trying small dose of Trulicity to see how she tolerates it, I did explain to the patient that she may not notice a whole lot change in her glucose data but long-term plan is to see if increasing the Trulicity will decrease her insulin demands, cautioned against GI side effects  MEDICATIONS: Start Trulicity 0.75 mg weekly Continue Farxiga 10 mg daily Continue Toujeo 6 units daily Continue   NovoLog 4 units with breakfast, 4 units with lunch, and 5 units with supper Change correction factor : NovoLog (BG -130/55)  EDUCATION / INSTRUCTIONS: BG monitoring instructions: Patient is instructed to check her blood sugars 3 times a day, before meals. Call [facility] if: BG persistently < 70  I reviewed the Rule of 15 for the treatment of hypoglycemia in detail with the patient. Literature supplied.   2) Diabetic complications:  Eye: Does not have known diabetic retinopathy.  Neuro/ Feet: Does not have known diabetic peripheral neuropathy. Renal: Patient does not have known baseline CKD. She is  on an ACEI/ARB at present.   Follow-up in 4 months     Signed electronically by: Lyndle Herrlich, MD  [facility]  [facility] [address] [place], [place] [address] Phone: [phone] FAX: [phone]   CC: Blane Ohara, MD [address] [place] [address] Phone: [phone]  Fax: [phone]    Return to Endocrinology clinic as below: Future Appointments  Date Time Provider Department Center  06/05/2023 10:10 AM Ashwin Tibbs, Konrad Dolores, MD [facility] None  06/06/2023  9:30 AM [facility] None  06/06/2023 10:00 AM Weston Settle, MD [facility] None  06/28/2023  9:20 AM Thomasene Ripple, DO [facility] None  07/17/2023 10:00 AM [facility] DX DEXA 1 [facility]  07/18/2023  8:00 AM [facility] None  07/20/2023  2:20 PM Cox, Fritzi Mandes, MD [facility] None  04/03/2024 10:20 AM Blane Ohara, MD [facility] None

## 2023-06-05 NOTE — Telephone Encounter (Signed)
Patient aware.

## 2023-06-05 NOTE — Telephone Encounter (Signed)
-----   Message from Adah Perl sent at 06/05/2023  1:39 PM EDT ----- Regarding: RE: INR Please have her continue warfarin 4 mg daily and repeat in 1 week. Thanks ----- Message ----- From: Dyane Dustman, RN Sent: 06/05/2023   8:33 AM EDT To: Adah Perl, PA-C Subject: INR                                            INR 2.2

## 2023-06-05 NOTE — Patient Instructions (Addendum)
Start Trulicity 0.75 mg ONCE weekly  Continue  farxiga 10 mg , 1 tablet every morning  Continue Toujeo 6 units once daily  Continue Novolog 4 units with Breakfast, 4 units with Lunch and 5 units with Supper  Novolog correctional insulin: ADD extra units on insulin to your meal-time Novolog dose if your blood sugars are higher than 185. Use the scale below to help guide you:   Blood sugar before meal Number of units to inject  Less than 185 0 unit  186 - 240 1 units  241 - 295 2 units  296 - 350 3 units  351 - 405 4 units  406 - 460 5 units      HOW TO TREAT LOW BLOOD SUGARS (Blood sugar LESS THAN 70 MG/DL) Please follow the RULE OF 15 for the treatment of hypoglycemia treatment (when your (blood sugars are less than 70 mg/dL)   STEP 1: Take 15 grams of carbohydrates when your blood sugar is low, which includes:  3-4 GLUCOSE TABS  OR 3-4 OZ OF JUICE OR REGULAR SODA OR ONE TUBE OF GLUCOSE GEL    STEP 2: RECHECK blood sugar in 15 MINUTES STEP 3: If your blood sugar is still low at the 15 minute recheck --> then, go back to STEP 1 and treat AGAIN with another 15 grams of carbohydrates.

## 2023-06-06 ENCOUNTER — Other Ambulatory Visit: Payer: Self-pay | Admitting: Oncology

## 2023-06-06 ENCOUNTER — Inpatient Hospital Stay: Payer: Medicare PPO | Attending: Oncology | Admitting: Oncology

## 2023-06-06 ENCOUNTER — Encounter: Payer: Self-pay | Admitting: Internal Medicine

## 2023-06-06 ENCOUNTER — Inpatient Hospital Stay: Payer: Medicare PPO

## 2023-06-06 VITALS — BP 145/63 | HR 52 | Temp 97.3°F | Resp 14 | Ht 61.0 in | Wt 144.8 lb

## 2023-06-06 DIAGNOSIS — D6859 Other primary thrombophilia: Secondary | ICD-10-CM | POA: Diagnosis present

## 2023-06-06 DIAGNOSIS — D649 Anemia, unspecified: Secondary | ICD-10-CM | POA: Insufficient documentation

## 2023-06-06 DIAGNOSIS — Z7901 Long term (current) use of anticoagulants: Secondary | ICD-10-CM | POA: Insufficient documentation

## 2023-06-06 DIAGNOSIS — D631 Anemia in chronic kidney disease: Secondary | ICD-10-CM

## 2023-06-06 LAB — IRON AND TIBC
Iron: 78 ug/dL (ref 28–170)
Saturation Ratios: 22 % (ref 10.4–31.8)
TIBC: 354 ug/dL (ref 250–450)
UIBC: 276 ug/dL

## 2023-06-06 LAB — CMP (CANCER CENTER ONLY)
ALT: 32 U/L (ref 0–44)
AST: 30 U/L (ref 15–41)
Albumin: 3.8 g/dL (ref 3.5–5.0)
Alkaline Phosphatase: 104 U/L (ref 38–126)
Anion gap: 12 (ref 5–15)
BUN: 33 mg/dL — ABNORMAL HIGH (ref 8–23)
CO2: 27 mmol/L (ref 22–32)
Calcium: 9.4 mg/dL (ref 8.9–10.3)
Chloride: 97 mmol/L — ABNORMAL LOW (ref 98–111)
Creatinine: 1.05 mg/dL — ABNORMAL HIGH (ref 0.44–1.00)
GFR, Estimated: 54 mL/min — ABNORMAL LOW (ref >60)
Glucose, Bld: 130 mg/dL — ABNORMAL HIGH (ref 70–99)
Potassium: 3.9 mmol/L (ref 3.5–5.1)
Sodium: 136 mmol/L (ref 135–145)
Total Bilirubin: 0.4 mg/dL (ref 0.3–1.2)
Total Protein: 6.8 g/dL (ref 6.5–8.1)

## 2023-06-06 LAB — CBC WITH DIFFERENTIAL (CANCER CENTER ONLY)
Abs Immature Granulocytes: 0.02 K/uL (ref 0.00–0.07)
Basophils Absolute: 0.1 K/uL (ref 0.0–0.1)
Basophils Relative: 1 %
Eosinophils Absolute: 0.2 K/uL (ref 0.0–0.5)
Eosinophils Relative: 2 %
HCT: 39.2 % (ref 36.0–46.0)
Hemoglobin: 12.5 g/dL (ref 12.0–15.0)
Immature Granulocytes: 0 %
Lymphocytes Relative: 18 %
Lymphs Abs: 1.1 K/uL (ref 0.7–4.0)
MCH: 30.2 pg (ref 26.0–34.0)
MCHC: 31.9 g/dL (ref 30.0–36.0)
MCV: 94.7 fL (ref 80.0–100.0)
Monocytes Absolute: 0.7 K/uL (ref 0.1–1.0)
Monocytes Relative: 11 %
Neutro Abs: 4.3 K/uL (ref 1.7–7.7)
Neutrophils Relative %: 68 %
Platelet Count: 266 K/uL (ref 150–400)
RBC: 4.14 MIL/uL (ref 3.87–5.11)
RDW: 14.2 % (ref 11.5–15.5)
WBC Count: 6.3 K/uL (ref 4.0–10.5)
nRBC: 0 % (ref 0.0–0.2)

## 2023-06-06 LAB — FERRITIN: Ferritin: 39 ng/mL (ref 11–307)

## 2023-06-06 NOTE — Progress Notes (Signed)
[facility]  [address] [place],  [place]  [address] [phone]  Clinic Day:  06/06/2023  Referring physician: Blane Ohara, MD   HISTORY OF PRESENT ILLNESS:  The patient is a 77 y.o. 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  She comes in today to reassess her clinical picture.  Over the past few weeks, her INR has fluctuated erratically to where her warfarin has had to be frequently adjusted.  She claims to be compliant with her daily warfarin prescription.  She denies having any new symptoms/findings which concern her for a recurrent blood clot.   PHYSICAL EXAM:  Blood pressure (!) 145/63, pulse (!) 52, temperature (!) 97.3 F (36.3 C), resp. rate 14, height 5\' 1"  (1.549 m), weight 144 lb 12.8 oz (65.7 kg), SpO2 97%. Wt Readings from Last 3 Encounters:  06/06/23 144 lb 12.8 oz (65.7 kg)  06/05/23 148 lb (67.1 kg)  04/17/23 145 lb 9.6 oz (66 kg)   Body mass index is 27.36 kg/m. Performance status (ECOG): 1 - Symptomatic but completely ambulatory Physical Exam Constitutional:      Appearance: Normal appearance. She is not ill-appearing.  HENT:     Mouth/Throat:     Mouth: Mucous membranes are moist.     Pharynx: Oropharynx is clear. No oropharyngeal exudate or posterior oropharyngeal erythema.  Cardiovascular:     Rate and Rhythm: Normal rate and regular rhythm.     Heart sounds: No murmur heard.    No friction rub. No gallop.  Pulmonary:     Effort: Pulmonary effort is normal. No respiratory distress.     Breath sounds: Normal breath sounds. No wheezing, rhonchi or rales.  Abdominal:     General: Bowel sounds are normal. There is no distension. There are no signs of injury.     Palpations: Abdomen is soft. There is no mass.  Musculoskeletal:        General: No swelling.     Right lower leg: No edema.     Left lower leg: No edema.  Lymphadenopathy:     Cervical: No  cervical adenopathy.     Upper Body:     Right upper body: No supraclavicular or axillary adenopathy.     Left upper body: No supraclavicular or axillary adenopathy.     Lower Body: No right inguinal adenopathy. No left inguinal adenopathy.  Skin:    General: Skin is warm.     Coloration: Skin is not jaundiced.     Findings: No lesion or rash.  Neurological:     General: No focal deficit present.     Mental Status: She is alert and oriented to person, place, and time. Mental status is at baseline.  Psychiatric:        Mood and Affect: Mood normal.        Behavior: Behavior normal.        Thought Content: Thought content normal.    LABS:      Latest Ref Rng & Units 06/06/2023    9:40 AM 04/13/2023    8:07 AM 03/27/2023    2:41 PM  CBC  WBC 4.0 - 10.5 K/uL 6.3  7.0  7.8   Hemoglobin 12.0 - 15.0 g/dL 86.5  78.4  69.6   Hematocrit 36.0 - 46.0 % 39.2  43.3  40.5   Platelets 150 - 400 K/uL 266  285  282  Latest Ref Rng & Units 06/06/2023    9:40 AM 04/13/2023    8:07 AM 03/27/2023    2:41 PM  CMP  Glucose 70 - 99 mg/dL 960  454  098   BUN 8 - 23 mg/dL 33  27  21   Creatinine 0.44 - 1.00 mg/dL 1.19  1.47  8.29   Sodium 135 - 145 mmol/L 136  138  137   Potassium 3.5 - 5.1 mmol/L 3.9  4.6  4.0   Chloride 98 - 111 mmol/L 97  96  95   CO2 22 - 32 mmol/L 27  26  23    Calcium 8.9 - 10.3 mg/dL 9.4  56.2  9.4   Total Protein 6.5 - 8.1 g/dL 6.8  6.9    Total Bilirubin 0.3 - 1.2 mg/dL 0.4  0.4    Alkaline Phos 38 - 126 U/L 104  108    AST 15 - 41 U/L 30  28    ALT 0 - 44 U/L 32  26      Latest Reference Range & Units 06/06/23 09:41  Iron 28 - 170 ug/dL 78  UIBC ug/dL 130  TIBC 865 - 784 ug/dL 696  Saturation Ratios 10.4 - 31.8 % 22  Ferritin 11 - 307 ng/mL 39    ASSESSMENT & PLAN:  Assessment/Plan:  A 77 y.o. female with protein S deficiency, as well as a history of mild anemia.  Overall, the patient appears to be doing well.  I once again spoke with the patient about  considering a direct oral anticoagulant (DOAC) (ie.  Xarelto, Eliquis) for her protein S management, particularly as such agents lead to auto-anticoagulation without the need for frequent INR checks.  The patient was told to think about the switch in therapy when considering how low a precision her INR results have been over these past few weeks.  If she is willing to switch to a DOAC, our office would help facilitate the switch.  She also has a history of anemia, for which her iron and hemoglobin levels remain fine.  As she is doing well, I will see her back in 1 year for repeat clinical assessment.  The patient understands all the plans discussed today and is in agreement with them.    Alysse Rathe Kirby Funk, MD

## 2023-06-06 NOTE — Addendum Note (Signed)
Addended by: Rennis Harding A on: 06/06/2023 06:13 PM   Modules accepted: Level of Service

## 2023-06-09 ENCOUNTER — Encounter: Payer: Self-pay | Admitting: Hematology and Oncology

## 2023-06-12 ENCOUNTER — Encounter: Payer: Self-pay | Admitting: Hematology and Oncology

## 2023-06-14 ENCOUNTER — Telehealth: Payer: Self-pay

## 2023-06-14 NOTE — Telephone Encounter (Signed)
Pt LVM on nurse line stating her INR was 1.6 on Monday. She is taking coumadin 4mg  po every day. Please advise.

## 2023-06-19 ENCOUNTER — Telehealth: Payer: Self-pay

## 2023-06-19 NOTE — Telephone Encounter (Signed)
Pt called to report INR 2.1 today. Please advise.

## 2023-06-21 ENCOUNTER — Other Ambulatory Visit: Payer: Self-pay

## 2023-06-21 MED ORDER — BD PEN NEEDLE NANO 2ND GEN 32G X 4 MM MISC: 3 refills | Status: DC

## 2023-06-26 ENCOUNTER — Encounter: Payer: Self-pay | Admitting: Hematology and Oncology

## 2023-06-27 ENCOUNTER — Telehealth: Payer: Self-pay

## 2023-06-27 NOTE — Telephone Encounter (Signed)
Patient notified and voiced understanding.

## 2023-06-27 NOTE — Telephone Encounter (Signed)
-----   Message from Pamela [name] sent at 06/27/2023 10:49 AM EDT ----- Please have her continue the same coumadin, 5 mg Tu, Sat, 4 mg rest of days. Repeat home INR in 1 week. Thanks

## 2023-06-28 ENCOUNTER — Ambulatory Visit: Payer: Medicare PPO | Attending: Cardiology | Admitting: Cardiology

## 2023-06-28 ENCOUNTER — Encounter: Payer: Self-pay | Admitting: Cardiology

## 2023-06-28 ENCOUNTER — Ambulatory Visit (INDEPENDENT_AMBULATORY_CARE_PROVIDER_SITE_OTHER): Payer: Medicare PPO

## 2023-06-28 VITALS — BP 134/60 | HR 46 | Ht 61.0 in | Wt 148.2 lb

## 2023-06-28 DIAGNOSIS — I493 Ventricular premature depolarization: Secondary | ICD-10-CM | POA: Diagnosis not present

## 2023-06-28 DIAGNOSIS — G4730 Sleep apnea, unspecified: Secondary | ICD-10-CM

## 2023-06-28 DIAGNOSIS — I480 Paroxysmal atrial fibrillation: Secondary | ICD-10-CM

## 2023-06-28 DIAGNOSIS — E782 Mixed hyperlipidemia: Secondary | ICD-10-CM

## 2023-06-28 DIAGNOSIS — I10 Essential (primary) hypertension: Secondary | ICD-10-CM | POA: Diagnosis not present

## 2023-06-28 NOTE — Patient Instructions (Addendum)
Medication Instructions:  Your physician recommends that you continue on your current medications as directed. Please refer to the Current Medication list given to you today.  *If you need a refill on your cardiac medications before your next appointment, please call your pharmacy*   Lab Work: None   Testing/Procedures: Christena Deem- Long Term Monitor Instructions  Your physician has requested you wear a ZIO patch monitor for 7 days.  This is a single patch monitor. [employer] supplies one patch monitor per enrollment. Additional stickers are not available. Please do not apply patch if you will be having a Nuclear Stress Test,  Echocardiogram, Cardiac CT, MRI, or Chest Xray during the period you would be wearing the  monitor. The patch cannot be worn during these tests. You cannot remove and re-apply the  ZIO XT patch monitor.  Your ZIO patch monitor will be mailed 3 day USPS to your address on file. It may take 3-5 days  to receive your monitor after you have been enrolled.  Once you have received your monitor, please review the enclosed instructions. Your monitor  has already been registered assigning a specific monitor serial # to you.  Billing and Patient Assistance Program Information  We have supplied [employer] with any of your insurance information on file for billing purposes. [employer] offers a sliding scale Patient Assistance Program for patients that do not have  insurance, or whose insurance does not completely cover the cost of the ZIO monitor.  You must apply for the Patient Assistance Program to qualify for this discounted rate.  To apply, please call [employer] at [phone], select option 4, select option 2, ask to apply for  Patient Assistance Program. Meredeth Ide will ask your household income, and how many people  are in your household. They will quote your out-of-pocket cost based on that information.  [employer] will also be able to set up a 75-month, interest-free payment plan if  needed.  Applying the monitor   Shave hair from upper left chest.  Hold abrader disc by orange tab. Rub abrader in 40 strokes over the upper left chest as  indicated in your monitor instructions.  Clean area with 4 enclosed alcohol pads. Let dry.  Apply patch as indicated in monitor instructions. Patch will be placed under collarbone on left  side of chest with arrow pointing upward.  Rub patch adhesive wings for 2 minutes. Remove white label marked "1". Remove the white  label marked "2". Rub patch adhesive wings for 2 additional minutes.  While looking in a mirror, press and release button in center of patch. A small green light will  flash 3-4 times. This will be your only indicator that the monitor has been turned on.  Do not shower for the first 24 hours. You may shower after the first 24 hours.  Press the button if you feel a symptom. You will hear a small click. Record Date, Time and  Symptom in the Patient Logbook.  When you are ready to remove the patch, follow instructions on the last 2 pages of Patient  Logbook. Stick patch monitor onto the last page of Patient Logbook.  Place Patient Logbook in the blue and white box. Use locking tab on box and tape box closed  securely. The blue and white box has prepaid postage on it. Please place it in the mailbox as  soon as possible. Your physician should have your test results approximately 7 days after the  monitor has been mailed back to [facility].  Call [facility] Customer Care at [phone] if you have questions regarding  your ZIO XT patch monitor. Call them immediately if you see an orange light blinking on your  monitor.  If your monitor falls off in less than 4 days, contact our Monitor department at [phone].  If your monitor becomes loose or falls off after 4 days call [employer] at [phone] for  suggestions on securing your monitor  Follow-Up: At [facility], you and your health needs are our  priority.  As part of our continuing mission to provide you with exceptional heart care, we have created designated Provider Care Teams.  These Care Teams include your primary Cardiologist (physician) and Advanced Practice Providers (APPs -  Physician Assistants and Nurse Practitioners) who all work together to provide you with the care you need, when you need it.  Your next appointment:   6 month(s)  Provider:   Thomasene Ripple, DO

## 2023-06-28 NOTE — Progress Notes (Unsigned)
Enrolled for [employer] to mail a ZIO XT long term holter monitor to the patients address on file.  

## 2023-06-28 NOTE — Progress Notes (Signed)
Cardiology Office Note:    Date:  06/28/2023   ID:  Pamela [name], DOB Jul 28, 1946, MRN [id number]  PCP:  Pamela Ohara, MD  Cardiologist:  Pamela Ripple, DO  Electrophysiologist:  None   Referring MD: Pamela Ohara, MD    " I have had some chest pain"   History of Present Illness:    Pamela [name] is a 77 y.o. 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 She had a ORIF ankle fracture in June and status post open reduction internal fixation.  Since her last visit with me she has been seen in our office at that time she complained of chest discomfort a cardiac PET was ordered which did not show any evidence of ischemia.  She tells me she still is experiencing these intermittent tightness after she perform significant exercise.   The symptoms occur when she overexerts herself, describing the sensation as 'a ton of bricks' on her chest. The patient recently worked at a fair for four hours, which triggered the symptoms. She also reports stress due to a family member's terminal bladder cancer diagnosis and another family member's dementia. The patient's blood sugar has been fluctuating, and she is on a new sliding scale for insulin and Trulicity once a week..  Past Medical History:  Diagnosis Date   Anticoagulated on Coumadin    chronic--- managed by hematology/ oncology,   Asthma, mild intermittent    followed by pcp--- per pt last exacerbation winter 2021 w/ acute bronchitis   Bilateral leg cramps    Blood dyscrasia    Chronic constipation    CKD (chronic kidney disease), stage III (HCC)    Closed bimalleolar fracture of left ankle 02/26/2020   DDD (degenerative disc disease), cervical    w/ spondylosis,  per pt last  steroid injection 06/ 2022   DDD (degenerative disc disease), lumbosacral    DVT (deep venous thrombosis) (HCC) 07/30/2013   Dyspnea    occasionally   Dysrhythmia    GERD (gastroesophageal reflux disease)    History of cardiac murmur as a child    History of DVT of lower extremity    left lower extremity in 1980s, fell when bowling   History of pulmonary embolus (PE) 1993   per pt left lung post op 2 wks cholecystectomy   History of rheumatic fever as a child    per last echo 12-31-2019 no valvular issues   History of TIA (transient ischemic attack)    2014 and 2018 or 2019,  per pt no residual   HTN (hypertension)    followed by pcp   Hyperlipidemia    Hypothyroidism    IDA (iron deficiency anemia)    Macular degeneration of both eyes    Mild obstructive sleep apnea    per pt dx 2017 tried to uses cpap but intolerant   Mixed incontinence urge and stress    urologist--- dr Pamela [name]   OA (osteoarthritis) 07/06/2018   Osteoporosis    PAF (paroxysmal atrial fibrillation) (HCC) 10/01/2014   cardiologist--- dr Lavona Mound Meshia Rau;   cardiac cath 02-18-2013 normal coronaries arteries, ef 50%, cath done since echo showed ef 30-35%; nucleat stress study 04/ 2020 normal , normal echo 04/ 2021,  event monitor 09-14-2020 rare ST/AT variable block   Pneumonia  PONV (postoperative nausea and vomiting)    Protein S deficiency (HCC)    followed by hemotology/ oncology-- dr d. Melvyn Neth ([facility]) dx 1980s;  prior DVT left lower leg 1980s and left lung PE 1993; chronic  coumadin since 1980s   PVC's (premature ventricular contractions)    followed by cardiology   S/P cardiac catheterization 02/2013   Normal coronaries; low normal EF at 50%   Solitary pulmonary nodule on lung CT 02/06/2019   First noted 01/13/2014 > no change as of 12/21/2018   Spondylolisthesis, lumbar region 08/09/2018   Stroke (HCC)    TIA in 2018 or 2019   Transient ischemic attack 07/30/2013   Type 2 diabetes  mellitus treated with insulin (HCC)    endocrilogist--- Pamela reardon NP     (03-10-2021  pt continuously checks blood sugar throughout the day w/ Libre, fasting sugar --- 69--200)   Wears glasses    Wears hearing aid in both ears     Past Surgical History:  Procedure Laterality Date   ABDOMINAL HYSTERECTOMY     BLEPHAROPLASTY     CARDIAC CATHETERIZATION  02/18/2013   @ [facility]  by Dr [place];  normal coronaries w/ preserved LVF, ef 50%;   previous cath 2001 normal ef 65%   CARPAL TUNNEL RELEASE Bilateral 1994   CATARACT EXTRACTION W/ INTRAOCULAR LENS IMPLANT Bilateral 2017   CHOLECYSTECTOMY     CHOLECYSTECTOMY, LAPAROSCOPIC  1993   COLONOSCOPY     EAR BIOPSY Left    FINGER SURGERY Left 2018   thumb   FOOT SURGERY     FOOT TENDON SURGERY Right    early 2000s   FRACTURE SURGERY Left 02/13/2020   left femur   HAND SURGERY Right 04/2022   HARDWARE REMOVAL Left 03/12/2021   Procedure: HARDWARE REMOVAL;  Surgeon: Yolonda Kida, MD;  Location: [facility];  Service: Orthopedics;  Laterality: Left;  60 MINS   JOINT REPLACEMENT     LUMBAR LAMINECTOMY/DECOMPRESSION MICRODISCECTOMY N/A 12/22/2022   Procedure: Lumbar Two-Lumbar Three, Lumbar Three-Lumbar Four Lumbar Decompression;  Surgeon: Barnett Abu, MD;  Location: [facility] OR;  Service: Neurosurgery;  Laterality: N/A;  RM 20 3C   ORIF ANKLE FRACTURE Left 02/26/2020   Procedure: OPEN REDUCTION INTERNAL FIXATION (ORIF) ANKLE FRACTURE;  Surgeon: Yolonda Kida, MD;  Location: [facility] OR;  Service: Orthopedics;  Laterality: Left;  90 mins   TONSILLECTOMY AND ADENOIDECTOMY Bilateral    TOTAL KNEE ARTHROPLASTY Left 03/2005   TOTAL VAGINAL HYSTERECTOMY  1988   per pt still has ovaries    Current Medications: Current Meds  Medication Sig   acetaminophen (TYLENOL) 650 MG CR tablet Take 650-1,300 mg by mouth every 8 (eight) hours as needed for pain.   albuterol (VENTOLIN HFA) 108 (90 Base) MCG/ACT inhaler INHALE 1 TO 2  PUFFS INTO THE LUNGS EVERY 6 HOURS AS NEEDED FOR WHEEZING OR SHORTNESS OF BREATH   amLODipine (NORVASC) 5 MG tablet TAKE 1 TABLET(5 MG) BY MOUTH DAILY   aspirin EC (ASPIRIN LOW DOSE) 81 MG tablet TAKE 1 TABLET(81 MG) BY MOUTH DAILY   atorvastatin (LIPITOR) 80 MG tablet TAKE 1 TABLET(80 MG) BY MOUTH DAILY   Calcium Citrate-Vitamin D (CALCIUM CITRATE + PO) Take 1 tablet by mouth daily.   carboxymethylcellulose (REFRESH PLUS) 0.5 % SOLN Place 1 drop into both eyes 3 (three) times daily as needed (dry eyes).   Cholecalciferol (VITAMIN D3) 50 MCG (2000 UT) TABS Take 4,000 Units by mouth daily with lunch.  Continuous Glucose Sensor (FREESTYLE LIBRE 2 SENSOR) MISC 2 each by Does not apply route every 14 (fourteen) days. E11.40   cyanocobalamin (VITAMIN B12) 1000 MCG tablet Take 1,000 mcg by mouth daily.   dapagliflozin propanediol (FARXIGA) 10 MG TABS tablet Take 1 tablet (10 mg total) by mouth daily before breakfast.   denosumab (PROLIA) 60 MG/ML SOSY injection Inject 60 mg into the skin every 6 (six) months.   Dulaglutide (TRULICITY) 0.75 MG/0.5ML SOPN Inject 0.75 mg into the skin once a week.   estradiol (ESTRACE) 0.1 MG/GM vaginal cream Place 1 Applicatorful vaginally 2 (two) times a week. As needed   ferrous sulfate 325 (65 FE) MG tablet Take 325 mg by mouth daily with lunch.   furosemide (LASIX) 40 MG tablet Take 1 tablet (40 mg total) by mouth daily.   GEMTESA 75 MG TABS Take 75 mg by mouth at bedtime.   insulin aspart (NOVOLOG FLEXPEN) 100 UNIT/ML FlexPen Max daily 30 units   insulin glargine, 1 Unit Dial, (TOUJEO) 300 UNIT/ML Solostar Pen Inject 6 Units into the skin at bedtime.   Insulin Pen Needle (BD PEN NEEDLE NANO 2ND GEN) 32G X 4 MM MISC USE TO INJECT INSULIN four times DAILY   isosorbide mononitrate (IMDUR) 30 MG 24 hr tablet 1 tablet by mouth daily, 2nd attempt, please reach arrange appt for further refills   lisinopril (ZESTRIL) 20 MG tablet TAKE 2 TABLETS(40 MG) BY MOUTH TWICE DAILY    magnesium oxide (MAG-OX) 400 (240 Mg) MG tablet TAKE 2 TABLETS(800 MG) BY MOUTH TWICE DAILY (Patient taking differently: Take 800 mg by mouth 2 (two) times daily.)   Multiple Vitamins-Minerals (PRESERVISION AREDS PO) Take 1 capsule by mouth 2 (two) times daily.   nystatin cream (MYCOSTATIN) Apply 1 Application topically 4 (four) times daily as needed for dry skin (yeast infection).   ondansetron (ZOFRAN-ODT) 8 MG disintegrating tablet Take 8 mg by mouth every 8 (eight) hours as needed for nausea or vomiting.    oxyCODONE-acetaminophen (PERCOCET/ROXICET) 5-325 MG tablet Take 1 tablet by mouth every 6 (six) hours as needed for moderate pain or severe pain.   pantoprazole (PROTONIX) 40 MG tablet TAKE 1 TABLET(40 MG) BY MOUTH EVERY MORNING   polyethylene glycol powder (MIRALAX) 17 GM/SCOOP powder Take 17 g by mouth daily as needed for moderate constipation.   pyridoxine (B-6) 100 MG tablet Take 100 mg by mouth daily.   sennosides-docusate sodium (SENOKOT-S) 8.6-50 MG tablet Take 1 tablet by mouth daily.   sodium chloride (OCEAN) 0.65 % SOLN nasal spray Place 1 spray into both nostrils as needed for congestion.   warfarin (COUMADIN) 1 MG tablet Take 1 mg by mouth every evening. Take with 5 mg tab to get 6 mg   warfarin (COUMADIN) 3 MG tablet Take 1 tablet (3 mg total) by mouth daily.   warfarin (COUMADIN) 4 MG tablet Take 1 tablet (4 mg total) by mouth daily.   warfarin (COUMADIN) 5 MG tablet TAKE 1 TABLET BY MOUTH EVERY DAY     Allergies:   Ketek [telithromycin], Loxapine succinate, Naproxen sodium, Amoxapine and related, Darvon, Belsomra [suvorexant], Cymbalta [duloxetine hcl], Duloxetine, Gabapentin, Lyrica [pregabalin], Ozempic (0.25 or 0.5 mg-dose) [semaglutide(0.25 or 0.5mg -dos)], Semaglutide, Amoxicillin, and Propoxyphene   Social History   Socioeconomic History   Marital status: Married    Spouse name: Jomarie Longs   Number of children: Not on file   Years of education: Not on file   Highest  education level: Some college, no degree  Occupational History  Not on file  Tobacco Use   Smoking status: Never   Smokeless tobacco: Never  Vaping Use   Vaping status: Never Used  Substance and Sexual Activity   Alcohol use: No   Drug use: Never   Sexual activity: Not on file    Comment: Hysterectomy  Other Topics Concern   Not on file  Social History Narrative   Lives with spouse in [place].  2 grown daughters.   Retired Print production planner     Social Determinants of Health   Financial Resource Strain: Low Risk  (03/30/2023)   Overall Financial Resource Strain (CARDIA)    Difficulty of Paying Living Expenses: Not hard at all  Food Insecurity: No Food Insecurity (03/30/2023)   Hunger Vital Sign    Worried About Running Out of Food in the Last Year: Never true    Ran Out of Food in the Last Year: Never true  Transportation Needs: No Transportation Needs (03/30/2023)   PRAPARE - Administrator, Civil Service (Medical): No    Lack of Transportation (Non-Medical): No  Physical Activity: Sufficiently Active (03/30/2023)   Exercise Vital Sign    Days of Exercise per Week: 7 days    Minutes of Exercise per Session: 30 min  Stress: No Stress Concern Present (03/30/2023)   [employer] of Occupational Health - Occupational Stress Questionnaire    Feeling of Stress : Not at all  Social Connections: Socially Integrated (03/30/2023)   Social Connection and Isolation Panel [NHANES]    Frequency of Communication with Friends and Family: More than three times a week    Frequency of Social Gatherings with Friends and Family: More than three times a week    Attends Religious Services: More than 4 times per year    Active Member of [employer] or Organizations: Yes    Attends Engineer, structural: More than 4 times per year    Marital Status: Married     Family History: The patient's family history includes Antithrombin III deficiency in her mother; Cirrhosis in her mother;  Coronary artery disease in her father; Diabetes in her father and sister; Heart attack in her brother, father, and sister; Hypertension in her brother, father, and sister; Kidney disease in her father; Lung cancer in her brother; Protein S deficiency in her daughter; Ulcerative colitis in her mother. There is no history of Stroke, Colitis, Colon polyps, Esophageal cancer, Liver cancer, Pancreatic cancer, Rectal cancer, Stomach cancer, or Breast cancer.  ROS:   Review of Systems  Constitution: Negative for decreased appetite, fever and weight gain.  HENT: Negative for congestion, ear discharge, hoarse voice and sore throat.   Eyes: Negative for discharge, redness, vision loss in right eye and visual halos.  Cardiovascular: Negative for chest pain, dyspnea on exertion, leg swelling, orthopnea and palpitations.  Respiratory: Negative for cough, hemoptysis, shortness of breath and snoring.   Endocrine: Negative for heat intolerance and polyphagia.  Hematologic/Lymphatic: Negative for bleeding problem. Does not bruise/bleed easily.  Skin: Negative for flushing, nail changes, rash and suspicious lesions.  Musculoskeletal: Negative for arthritis, joint pain, muscle cramps, myalgias, neck pain and stiffness.  Gastrointestinal: Negative for abdominal pain, bowel incontinence, diarrhea and excessive appetite.  Genitourinary: Negative for decreased libido, genital sores and incomplete emptying.  Neurological: Negative for brief paralysis, focal weakness, headaches and loss of balance.  Psychiatric/Behavioral: Negative for altered mental status, depression and suicidal ideas.  Allergic/Immunologic: Negative for HIV exposure and persistent infections.    EKGs/Labs/Other Studies Reviewed:  The following studies were reviewed today:   EKG: None today   Echocardiogram IMPRESSIONS   1. Left ventricular ejection fraction, by estimation, is 60 to 65%. The left ventricle has normal function. The left  ventricle has no regional wall motion abnormalities. There is moderate concentric left ventricular hypertrophy. Left ventricular  diastolic parameters are consistent with Grade I diastolic dysfunction (impaired relaxation).   2. Right ventricular systolic function is normal. The right ventricular size is normal. There is normal pulmonary artery systolic pressure.   3. Left atrial size was mildly dilated.   4. The mitral valve is normal in structure. Trivial mitral valve regurgitation. No evidence of mitral stenosis.   5. The aortic valve is tricuspid. Aortic valve regurgitation is not visualized. No aortic stenosis is present.   6. The inferior vena cava is normal in size with greater than 50% respiratory variability, suggesting right atrial pressure of 3 mmHg.   Recent Labs: 08/04/2022: Magnesium 2.1 03/27/2023: TSH 1.040 06/06/2023: ALT 32; BUN 33; Creatinine 1.05; Hemoglobin 12.5; Platelet Count 266; Potassium 3.9; Sodium 136  Recent Lipid Panel    Component Value Date/Time   CHOL 154 04/13/2023 0807   TRIG 67 04/13/2023 0807   HDL 79 04/13/2023 0807   CHOLHDL 1.9 04/13/2023 0807   LDLCALC 62 04/13/2023 0807    Physical Exam:    VS:  BP 134/60 (BP Location: Right Arm, Patient Position: Sitting, Cuff Size: Normal)   Pulse (!) 46   Ht 5\' 1"  (1.549 m)   Wt 148 lb 3.2 oz (67.2 kg)   SpO2 95%   BMI 28.00 kg/m     Wt Readings from Last 3 Encounters:  06/28/23 148 lb 3.2 oz (67.2 kg)  06/06/23 144 lb 12.8 oz (65.7 kg)  06/05/23 148 lb (67.1 kg)     GEN: Well nourished, well developed in no acute distress HEENT: Normal NECK: No JVD; No carotid bruits LYMPHATICS: No lymphadenopathy CARDIAC: S1S2 noted,RRR, no murmurs, rubs, gallops RESPIRATORY:  Clear to auscultation without rales, wheezing or rhonchi  ABDOMEN: Soft, non-tender, non-distended, +bowel sounds, no guarding. EXTREMITIES: No edema, No cyanosis, no clubbing MUSCULOSKELETAL:  No deformity  SKIN: Warm and  dry NEUROLOGIC:  Alert and oriented x 3, non-focal PSYCHIATRIC:  Normal affect, good insight  ASSESSMENT:    1. PAF (paroxysmal atrial fibrillation) (HCC)   2. Sleep apnea, unspecified type   3. Mixed hyperlipidemia   4. Essential hypertension   5. PVC (premature ventricular contraction)      PLAN:     Chest Pain Exertional chest pain and back pain with sensation of pressure. Normal perfusion study.  She does have frequent PVCs suspect this may be contributing to this and with no history of paroxysmal atrial fibrillation.  Will like to place a monitor on the patient. -Her cardiac PET showed evidence of interstitial lung disease will forward this information to her pulmonologist Dr. Sherene Sires.   Premature Ventricular Contractions (PVCs) Noted on auscultation and reported history of heart rate dropping to 35-38 bpm prior to back surgery. -EKG today shows sinus rhythm with frequent PVCs we will place a monitor on the patient to understand PVC burden.  This is being managed by his primary care doctor.  No adjustments for antidiabetic medications were made today.  Blood pressure is acceptable, continue with current antihypertensive regimen.  Follow-up in 6 months or sooner if symptoms persist or wor   The patient is in agreement with the above plan. The patient left the office in stable  condition.  The patient will follow up in 6 months or sooner if needed.   Medication Adjustments/Labs and Tests Ordered: Current medicines are reviewed at length with the patient today.  Concerns regarding medicines are outlined above.  Orders Placed This Encounter  Procedures   LONG TERM MONITOR (3-14 DAYS)   EKG 12-Lead    No orders of the defined types were placed in this encounter.   Patient Instructions  Medication Instructions:  Your physician recommends that you continue on your current medications as directed. Please refer to the Current Medication list given to you today.  *If you need  a refill on your cardiac medications before your next appointment, please call your pharmacy*   Lab Work: None   Testing/Procedures: Christena Deem- Long Term Monitor Instructions  Your physician has requested you wear a ZIO patch monitor for 7 days.  This is a single patch monitor. [employer] supplies one patch monitor per enrollment. Additional stickers are not available. Please do not apply patch if you will be having a Nuclear Stress Test,  Echocardiogram, Cardiac CT, MRI, or Chest Xray during the period you would be wearing the  monitor. The patch cannot be worn during these tests. You cannot remove and re-apply the  ZIO XT patch monitor.  Your ZIO patch monitor will be mailed 3 day USPS to your address on file. It may take 3-5 days  to receive your monitor after you have been enrolled.  Once you have received your monitor, please review the enclosed instructions. Your monitor  has already been registered assigning a specific monitor serial # to you.  Billing and Patient Assistance Program Information  We have supplied [employer] with any of your insurance information on file for billing purposes. [employer] offers a sliding scale Patient Assistance Program for patients that do not have  insurance, or whose insurance does not completely cover the cost of the ZIO monitor.  You must apply for the Patient Assistance Program to qualify for this discounted rate.  To apply, please call [employer] at [phone], select option 4, select option 2, ask to apply for  Patient Assistance Program. Meredeth Ide will ask your household income, and how many people  are in your household. They will quote your out-of-pocket cost based on that information.  [employer] will also be able to set up a 13-month, interest-free payment plan if needed.  Applying the monitor   Shave hair from upper left chest.  Hold abrader disc by orange tab. Rub abrader in 40 strokes over the upper left chest as  indicated in your monitor  instructions.  Clean area with 4 enclosed alcohol pads. Let dry.  Apply patch as indicated in monitor instructions. Patch will be placed under collarbone on left  side of chest with arrow pointing upward.  Rub patch adhesive wings for 2 minutes. Remove white label marked "1". Remove the white  label marked "2". Rub patch adhesive wings for 2 additional minutes.  While looking in a mirror, press and release button in center of patch. A small green light will  flash 3-4 times. This will be your only indicator that the monitor has been turned on.  Do not shower for the first 24 hours. You may shower after the first 24 hours.  Press the button if you feel a symptom. You will hear a small click. Record Date, Time and  Symptom in the Patient Logbook.  When you are ready to remove the patch, follow instructions on the last 2 pages of  Patient  Logbook. Stick patch monitor onto the last page of Patient Logbook.  Place Patient Logbook in the blue and white box. Use locking tab on box and tape box closed  securely. The blue and white box has prepaid postage on it. Please place it in the mailbox as  soon as possible. Your physician should have your test results approximately 7 days after the  monitor has been mailed back to [facility].  Call [facility] Customer Care at [phone] if you have questions regarding  your ZIO XT patch monitor. Call them immediately if you see an orange light blinking on your  monitor.  If your monitor falls off in less than 4 days, contact our Monitor department at [phone].  If your monitor becomes loose or falls off after 4 days call [employer] at [phone] for  suggestions on securing your monitor  Follow-Up: At [facility], you and your health needs are our priority.  As part of our continuing mission to provide you with exceptional heart care, we have created designated Provider Care Teams.  These Care Teams include your primary Cardiologist  (physician) and Advanced Practice Providers (APPs -  Physician Assistants and Nurse Practitioners) who all work together to provide you with the care you need, when you need it.  Your next appointment:   6 month(s)  Provider:   Thomasene Ripple, DO    Adopting a Healthy Lifestyle.  Know what a healthy weight is for you (roughly BMI <25) and aim to maintain this   Aim for 7+ servings of fruits and vegetables daily   65-80+ fluid ounces of water or unsweet tea for healthy kidneys   Limit to max 1 drink of alcohol per day; avoid smoking/tobacco   Limit animal fats in diet for cholesterol and heart health - choose grass fed whenever available   Avoid highly processed foods, and foods high in saturated/trans fats   Aim for low stress - take time to unwind and care for your mental health   Aim for 150 min of moderate intensity exercise weekly for heart health, and weights twice weekly for bone health   Aim for 7-9 hours of sleep daily   When it comes to diets, agreement about the perfect plan isnt easy to find, even among the experts. Experts at the [facility] of [employer] developed an idea known as the Healthy Eating Plate. Just imagine a plate divided into logical, healthy portions.   The emphasis is on diet quality:   Load up on vegetables and fruits - one-half of your plate: Aim for color and variety, and remember that potatoes dont count.   Go for whole grains - one-quarter of your plate: Whole wheat, barley, wheat berries, quinoa, oats, brown rice, and foods made with them. If you want pasta, go with whole wheat pasta.   Protein power - one-quarter of your plate: Fish, chicken, beans, and nuts are all healthy, versatile protein sources. Limit red meat.   The diet, however, does go beyond the plate, offering a few other suggestions.   Use healthy plant oils, such as olive, canola, soy, corn, sunflower and peanut. Check the labels, and avoid partially hydrogenated oil,  which have unhealthy trans fats.   If youre thirsty, drink water. Coffee and tea are good in moderation, but skip sugary drinks and limit milk and dairy products to one or two daily servings.   The type of carbohydrate in the diet is more important than the amount. Some sources  of carbohydrates, such as vegetables, fruits, whole grains, and beans-are healthier than others.   Finally, stay active  Signed, Pamela Ripple, DO  06/28/2023 10:00 AM    [facility]

## 2023-06-29 DIAGNOSIS — R112 Nausea with vomiting, unspecified: Secondary | ICD-10-CM | POA: Diagnosis not present

## 2023-06-29 DIAGNOSIS — R0981 Nasal congestion: Secondary | ICD-10-CM | POA: Diagnosis not present

## 2023-06-29 DIAGNOSIS — R051 Acute cough: Secondary | ICD-10-CM | POA: Diagnosis not present

## 2023-06-29 DIAGNOSIS — M791 Myalgia, unspecified site: Secondary | ICD-10-CM | POA: Diagnosis not present

## 2023-06-30 ENCOUNTER — Telehealth: Payer: Self-pay | Admitting: Cardiology

## 2023-06-30 ENCOUNTER — Telehealth: Payer: Self-pay

## 2023-06-30 DIAGNOSIS — I480 Paroxysmal atrial fibrillation: Secondary | ICD-10-CM

## 2023-06-30 NOTE — Telephone Encounter (Signed)
Spoke to patient who states her monitor arrived today. Advised her she could put it on today due to her appointment being on 11/11 she had enough time in between.       Patient called stating she saw Dr. Servando Salina on Monday, 10/21. She tested positive for COVID on Thursday, 06/29/23 she was calling to let Dr. Servando Salina know.

## 2023-06-30 NOTE — Telephone Encounter (Signed)
Patient called stating she saw Dr. Servando Salina on Monday, 10/21.  She tested positive for COVID on Thursday, 06/29/23 she was calling to let Dr. Servando Salina know.  She also called to say she hasn't received her long term monitor yet. She stated she is to wear it for 7 days and is going for a bone scan 11/11 she wants to know if she doesn't receive it within the 7 days prior to 11/11 should she still put it on.  Also she is going for x-rays 11/13 of her lower back.

## 2023-06-30 NOTE — Telephone Encounter (Signed)
Called patient and no answer. Left message to return call

## 2023-06-30 NOTE — Telephone Encounter (Signed)
Pamela [name] called the on-call phone stating that she was diagnosed with COVID the day before and that she went to urgent care, received a prescription for Lagevrio, as she is on blood thinner.  She was wondering what she should do if she deteriorates.  For now, she reports feeling like she was hit by a truck but denies any shortness of breath or chest pain or any alarming symptoms at this time.  Advised to take the medication which was prescribed, continue supportive treatment, stay adequately hydrated and watch out for any worsening symptoms.  To call 911 if any.  She does state understanding.  Thanks, Windell Moment MD

## 2023-07-03 ENCOUNTER — Telehealth: Payer: Self-pay

## 2023-07-03 NOTE — Telephone Encounter (Signed)
Spoke with patient regarding home INR of 1.8. Currently taking warfarin 5 mg Mon, Sat, 4 mg rest of days. She states she was diagnosed with COVID on 10/22. She is on Lagevrio [employer]) and completes that tomorrow. Was told less interaction with warfarin than other COVID medications. She has not been eating well. Will increase warfarin to 5 mg M,W,Sat, 4 mg rest of days and repeat home INR in 1 week. She states Dr. Melvyn Neth recommended switching to Eliquis, but she doesn't want to consider doing so until she is over her infection.

## 2023-07-03 NOTE — Telephone Encounter (Signed)
Patient called with her INR results. INR 1.8.

## 2023-07-10 ENCOUNTER — Telehealth: Payer: Self-pay

## 2023-07-10 NOTE — Telephone Encounter (Signed)
Pt called to report her INR this morning is 3.7. She is currently taking Coumadin 5mg  every day on Tue, Th, and Sat; 4mg  every day all other days.

## 2023-07-11 DIAGNOSIS — Z7901 Long term (current) use of anticoagulants: Secondary | ICD-10-CM | POA: Diagnosis not present

## 2023-07-11 DIAGNOSIS — D6859 Other primary thrombophilia: Secondary | ICD-10-CM | POA: Diagnosis not present

## 2023-07-13 DIAGNOSIS — I480 Paroxysmal atrial fibrillation: Secondary | ICD-10-CM | POA: Diagnosis not present

## 2023-07-17 ENCOUNTER — Ambulatory Visit: Payer: Medicare PPO | Admitting: Family Medicine

## 2023-07-17 ENCOUNTER — Telehealth: Payer: Self-pay

## 2023-07-17 ENCOUNTER — Inpatient Hospital Stay
Admission: RE | Admit: 2023-07-17 | Discharge: 2023-07-17 | Disposition: A | Discharge status: Home / Self Care | Payer: Medicare PPO | Source: Ambulatory Visit | Attending: Neurological Surgery | Admitting: Neurological Surgery

## 2023-07-17 ENCOUNTER — Encounter: Payer: Self-pay | Admitting: Family Medicine

## 2023-07-17 VITALS — BP 136/72 | HR 71 | Temp 97.5°F | Ht 61.0 in | Wt 148.0 lb

## 2023-07-17 DIAGNOSIS — Z23 Encounter for immunization: Secondary | ICD-10-CM | POA: Diagnosis not present

## 2023-07-17 DIAGNOSIS — E2839 Other primary ovarian failure: Secondary | ICD-10-CM | POA: Diagnosis not present

## 2023-07-17 DIAGNOSIS — I10 Essential (primary) hypertension: Secondary | ICD-10-CM

## 2023-07-17 DIAGNOSIS — E782 Mixed hyperlipidemia: Secondary | ICD-10-CM | POA: Diagnosis not present

## 2023-07-17 DIAGNOSIS — D6869 Other thrombophilia: Secondary | ICD-10-CM | POA: Diagnosis not present

## 2023-07-17 DIAGNOSIS — I110 Hypertensive heart disease with heart failure: Secondary | ICD-10-CM | POA: Diagnosis not present

## 2023-07-17 DIAGNOSIS — Z794 Long term (current) use of insulin: Secondary | ICD-10-CM | POA: Diagnosis not present

## 2023-07-17 DIAGNOSIS — E038 Other specified hypothyroidism: Secondary | ICD-10-CM

## 2023-07-17 DIAGNOSIS — E1140 Type 2 diabetes mellitus with diabetic neuropathy, unspecified: Secondary | ICD-10-CM

## 2023-07-17 DIAGNOSIS — I5030 Unspecified diastolic (congestive) heart failure: Secondary | ICD-10-CM

## 2023-07-17 DIAGNOSIS — N958 Other specified menopausal and perimenopausal disorders: Secondary | ICD-10-CM | POA: Diagnosis not present

## 2023-07-17 DIAGNOSIS — R252 Cramp and spasm: Secondary | ICD-10-CM | POA: Diagnosis not present

## 2023-07-17 DIAGNOSIS — R9389 Abnormal findings on diagnostic imaging of other specified body structures: Secondary | ICD-10-CM

## 2023-07-17 DIAGNOSIS — M8588 Other specified disorders of bone density and structure, other site: Secondary | ICD-10-CM | POA: Diagnosis not present

## 2023-07-17 DIAGNOSIS — M8580 Other specified disorders of bone density and structure, unspecified site: Secondary | ICD-10-CM

## 2023-07-17 DIAGNOSIS — I4891 Unspecified atrial fibrillation: Secondary | ICD-10-CM

## 2023-07-17 DIAGNOSIS — K219 Gastro-esophageal reflux disease without esophagitis: Secondary | ICD-10-CM | POA: Diagnosis not present

## 2023-07-17 MED ORDER — ZOLPIDEM TARTRATE 5 MG PO TABS: 5.0000 mg | ORAL_TABLET | Freq: Every evening | ORAL | 2 refills | Status: DC | PRN

## 2023-07-17 MED ORDER — LISINOPRIL 20 MG PO TABS: ORAL_TABLET | ORAL | 3 refills | Status: DC

## 2023-07-17 NOTE — Progress Notes (Unsigned)
Subjective:  Patient ID: Pamela [name], female    DOB: 26-Apr-1946  Age: 77 y.o. MRN: [id number]  Chief Complaint  Patient presents with   Medical Management of Chronic Issues    HPI  Diabetes:  Complications: Hypertension Glucose checking: Several times a day (CGM) (40-981). Sees Endocrinology.  Hypoglycemia: 2-3 weekly  Most recent A1C: 7.6% Current medications: Farxiga 10 mg daily, Toujeo 6 units daily.   NovoLog 4 unites for breakfast and lunch, 5 units supper, trulicity 0.75 weekly Last Eye Exam: UTD has another appt 12/24 Foot checks: several days per week..   Hyperlipidemia: Current medications: Atorvastatin 80 mg daily.   Hypertension: Complications: Diabetes, Hyperlipidemia. Current medications: Lisinopril 20 mg 2 daily, Amlodipine 5 mg daily, Aspirin 81 mg daily, Isosorbide 30 mg daily.   Atrial fibrillation: on coumadin.   GERD: protonix 40 mg once daily.    Protein S Deficiency: Dr. Melvyn Neth manages her coumadin. On coumadin daily. Uses MD PT/INR at home. *  5 mg wed and sat and 4 mg all other days.   Urge incontinence: on gemtesa 75 mg once daily. Working well.    Ct scan of lung concerning for interstitial lung disease. Some shortness of breath. Occasional chest tightness.   October 24: covid positive.     04/17/2023    8:55 AM 03/30/2023    2:14 PM 12/14/2022    9:06 AM 02/15/2022    1:16 PM 01/24/2022    8:21 AM  Depression screen PHQ 2/9  Decreased Interest 1 0 0 0 0  Down, Depressed, Hopeless 0 0 0 0 0  PHQ - 2 Score 1 0 0 0 0  Altered sleeping 3      Tired, decreased energy 2      Change in appetite 0      Feeling bad or failure about yourself  0      Trouble concentrating 1      Moving slowly or fidgety/restless 1      Suicidal thoughts 0      PHQ-9 Score 8      Difficult doing work/chores Somewhat difficult            04/17/2023    8:55 AM  Fall Risk   Falls in the past year? 1  Number falls in past yr: 1  Injury with Fall?  1  Risk for fall due to : History of fall(s);Impaired balance/gait  Follow up Falls evaluation completed;Falls prevention discussed    Patient Care Team: Blane Ohara, MD as PCP - General (Internal Medicine) Thomasene Ripple, DO as PCP - Cardiology (Cardiology) Weston Settle, MD as Consulting Physician (Oncology) Roxanne Gates, OD (Optometry) [facility], [facility] (Specialist) Nyoka Cowden, MD as Consulting Physician (Pulmonary Disease) Drema Halon, MD (Inactive) as Consulting Physician (Otolaryngology) Thomasene Ripple, DO as Consulting Physician (Cardiology) Romero Belling, MD (Inactive) as Consulting Physician (Endocrinology) Zenovia Jordan, MD as Consulting Physician (Rheumatology) Armbruster, Willaim Rayas, MD as Consulting Physician (Gastroenterology)   Review of Systems  Constitutional:  Positive for diaphoresis (night sweats). Negative for chills, fatigue and fever.  HENT:  Negative for congestion, ear pain, rhinorrhea and sore throat.   Respiratory:  Positive for shortness of breath. Negative for cough.   Cardiovascular:  Positive for chest pain.  Gastrointestinal:  Positive for abdominal pain (occasionally with diarrhea.), diarrhea (x 2 weeks) and nausea. Negative for constipation and vomiting.  Genitourinary:  Negative for dysuria and urgency.       Nocturia.  Leslye Peer has  helped with bladder incontinence  Musculoskeletal:  Negative for arthralgias, back pain and myalgias.       Leg pain BL constantly.   Neurological:  Positive for headaches. Negative for dizziness, weakness and light-headedness.  Psychiatric/Behavioral:  Negative for dysphoric mood. The patient is not nervous/anxious.     Current Outpatient Medications on File Prior to Visit  Medication Sig Dispense Refill   levothyroxine (SYNTHROID) 25 MCG tablet Take 25 mcg by mouth daily before breakfast.     acetaminophen (TYLENOL) 650 MG CR tablet Take 650-1,300 mg by mouth every 8 (eight) hours as needed for pain.      albuterol (VENTOLIN HFA) 108 (90 Base) MCG/ACT inhaler INHALE 1 TO 2 PUFFS INTO THE LUNGS EVERY 6 HOURS AS NEEDED FOR WHEEZING OR SHORTNESS OF BREATH 6.7 g 3   amLODipine (NORVASC) 5 MG tablet TAKE 1 TABLET(5 MG) BY MOUTH DAILY 90 tablet 3   aspirin EC (ASPIRIN LOW DOSE) 81 MG tablet TAKE 1 TABLET(81 MG) BY MOUTH DAILY 90 tablet 3   atorvastatin (LIPITOR) 80 MG tablet TAKE 1 TABLET(80 MG) BY MOUTH DAILY 90 tablet 3   carboxymethylcellulose (REFRESH PLUS) 0.5 % SOLN Place 1 drop into both eyes 3 (three) times daily as needed (dry eyes).     Cholecalciferol (VITAMIN D3) 50 MCG (2000 UT) TABS Take 4,000 Units by mouth daily with lunch.     Continuous Glucose Sensor (FREESTYLE LIBRE 2 SENSOR) MISC 2 each by Does not apply route every 14 (fourteen) days. E11.40 6 each 3   cyanocobalamin (VITAMIN B12) 1000 MCG tablet Take 1,000 mcg by mouth daily.     dapagliflozin propanediol (FARXIGA) 10 MG TABS tablet Take 1 tablet (10 mg total) by mouth daily before breakfast. 90 tablet 3   denosumab (PROLIA) 60 MG/ML SOSY injection Inject 60 mg into the skin every 6 (six) months.     Dulaglutide (TRULICITY) 0.75 MG/0.5ML SOPN Inject 0.75 mg into the skin once a week. 6 mL 3   estradiol (ESTRACE) 0.1 MG/GM vaginal cream Place 1 Applicatorful vaginally 2 (two) times a week. As needed     ferrous sulfate 325 (65 FE) MG tablet Take 325 mg by mouth daily with lunch.     furosemide (LASIX) 40 MG tablet Take 1 tablet (40 mg total) by mouth daily. 90 tablet 3   GEMTESA 75 MG TABS Take 75 mg by mouth at bedtime.     insulin aspart (NOVOLOG FLEXPEN) 100 UNIT/ML FlexPen Max daily 30 units 30 mL 3   insulin glargine, 1 Unit Dial, (TOUJEO) 300 UNIT/ML Solostar Pen Inject 6 Units into the skin at bedtime. 15 mL 3   Insulin Pen Needle (BD PEN NEEDLE NANO 2ND GEN) 32G X 4 MM MISC USE TO INJECT INSULIN four times DAILY 200 each 3   isosorbide mononitrate (IMDUR) 30 MG 24 hr tablet 1 tablet by mouth daily, 2nd attempt, please reach  arrange appt for further refills 90 tablet 3   magnesium oxide (MAG-OX) 400 (240 Mg) MG tablet TAKE 2 TABLETS(800 MG) BY MOUTH TWICE DAILY (Patient taking differently: Take 800 mg by mouth 2 (two) times daily.) 120 tablet 2   Multiple Vitamins-Minerals (PRESERVISION AREDS PO) Take 1 capsule by mouth 2 (two) times daily.     nystatin cream (MYCOSTATIN) Apply 1 Application topically 4 (four) times daily as needed for dry skin (yeast infection).     ondansetron (ZOFRAN-ODT) 8 MG disintegrating tablet Take 8 mg by mouth every 8 (eight) hours as needed for  nausea or vomiting.      pantoprazole (PROTONIX) 40 MG tablet TAKE 1 TABLET(40 MG) BY MOUTH EVERY MORNING 90 tablet 1   polyethylene glycol powder (MIRALAX) 17 GM/SCOOP powder Take 17 g by mouth daily as needed for moderate constipation.     pyridoxine (B-6) 100 MG tablet Take 100 mg by mouth daily.     sennosides-docusate sodium (SENOKOT-S) 8.6-50 MG tablet Take 1 tablet by mouth daily.     sodium chloride (OCEAN) 0.65 % SOLN nasal spray Place 1 spray into both nostrils as needed for congestion.     warfarin (COUMADIN) 1 MG tablet Take 1 mg by mouth every evening. Take with 5 mg tab to get 6 mg     warfarin (COUMADIN) 3 MG tablet Take 1 tablet (3 mg total) by mouth daily. 90 tablet 1   warfarin (COUMADIN) 4 MG tablet Take 1 tablet (4 mg total) by mouth daily. 90 tablet 1   warfarin (COUMADIN) 5 MG tablet TAKE 1 TABLET BY MOUTH EVERY DAY 90 tablet 0   No current facility-administered medications on file prior to visit.   Past Medical History:  Diagnosis Date   Anticoagulated on Coumadin    chronic--- managed by hematology/ oncology,   Asthma, mild intermittent    followed by pcp--- per pt last exacerbation winter 2021 w/ acute bronchitis   Bilateral leg cramps    Blood dyscrasia    Chronic constipation    CKD (chronic kidney disease), stage III (HCC)    Closed bimalleolar fracture of left ankle 02/26/2020   DDD (degenerative disc disease),  cervical    w/ spondylosis,  per pt last steroid injection 06/ 2022   DDD (degenerative disc disease), lumbosacral    DVT (deep venous thrombosis) (HCC) 07/30/2013   Dyspnea    occasionally   Dysrhythmia    GERD (gastroesophageal reflux disease)    History of cardiac murmur as a child    History of DVT of lower extremity    left lower extremity in 1980s, fell when bowling   History of pulmonary embolus (PE) 1993   per pt left lung post op 2 wks cholecystectomy   History of rheumatic fever as a child    per last echo 12-31-2019 no valvular issues   History of TIA (transient ischemic attack)    2014 and 2018 or 2019,  per pt no residual   HTN (hypertension)    followed by pcp   Hyperlipidemia    Hypothyroidism    IDA (iron deficiency anemia)    Macular degeneration of both eyes    Mild obstructive sleep apnea    per pt dx 2017 tried to uses cpap but intolerant   Mixed incontinence urge and stress    urologist--- dr Saddie Benders   OA (osteoarthritis) 07/06/2018   Osteoporosis    PAF (paroxysmal atrial fibrillation) (HCC) 10/01/2014   cardiologist--- dr Lavona Mound tobb;   cardiac cath 02-18-2013 normal coronaries arteries, ef 50%, cath done since echo showed ef 30-35%; nucleat stress study 04/ 2020 normal , normal echo 04/ 2021,  event monitor 09-14-2020 rare ST/AT variable block   Pneumonia    PONV (postoperative nausea and vomiting)    Protein S deficiency (HCC)    followed by hemotology/ oncology-- dr d. Melvyn Neth ([facility]) dx 1980s;  prior DVT left lower leg 1980s and left lung PE 1993; chronic  coumadin since 1980s   PVC's (premature ventricular contractions)    followed by cardiology   S/P cardiac  catheterization 02/2013   Normal coronaries; low normal EF at 50%   Solitary pulmonary nodule on lung CT 02/06/2019   First noted 01/13/2014 > no change as of 12/21/2018   Spondylolisthesis, lumbar region 08/09/2018   Stroke (HCC)    TIA in 2018 or 2019   Transient ischemic  attack 07/30/2013   Type 2 diabetes mellitus treated with insulin (HCC)    endocrilogist--- whitney reardon NP     (03-10-2021  pt continuously checks blood sugar throughout the day w/ Libre, fasting sugar --- 69--200)   Wears glasses    Wears hearing aid in both ears    Past Surgical History:  Procedure Laterality Date   ABDOMINAL HYSTERECTOMY     BLEPHAROPLASTY     CARDIAC CATHETERIZATION  02/18/2013   @ [facility]  by Dr [place];  normal coronaries w/ preserved LVF, ef 50%;   previous cath 2001 normal ef 65%   CARPAL TUNNEL RELEASE Bilateral 1994   CATARACT EXTRACTION W/ INTRAOCULAR LENS IMPLANT Bilateral 2017   CHOLECYSTECTOMY     CHOLECYSTECTOMY, LAPAROSCOPIC  1993   COLONOSCOPY     EAR BIOPSY Left    FINGER SURGERY Left 2018   thumb   FOOT SURGERY     FOOT TENDON SURGERY Right    early 2000s   FRACTURE SURGERY Left 02/13/2020   left femur   HAND SURGERY Right 04/2022   HARDWARE REMOVAL Left 03/12/2021   Procedure: HARDWARE REMOVAL;  Surgeon: Yolonda Kida, MD;  Location: [facility];  Service: Orthopedics;  Laterality: Left;  60 MINS   JOINT REPLACEMENT     LUMBAR LAMINECTOMY/DECOMPRESSION MICRODISCECTOMY N/A 12/22/2022   Procedure: Lumbar Two-Lumbar Three, Lumbar Three-Lumbar Four Lumbar Decompression;  Surgeon: Barnett Abu, MD;  Location: [facility] OR;  Service: Neurosurgery;  Laterality: N/A;  RM 20 3C   ORIF ANKLE FRACTURE Left 02/26/2020   Procedure: OPEN REDUCTION INTERNAL FIXATION (ORIF) ANKLE FRACTURE;  Surgeon: Yolonda Kida, MD;  Location: [facility] OR;  Service: Orthopedics;  Laterality: Left;  90 mins   TONSILLECTOMY AND ADENOIDECTOMY Bilateral    TOTAL KNEE ARTHROPLASTY Left 03/2005   TOTAL VAGINAL HYSTERECTOMY  1988   per pt still has ovaries    Family History  Problem Relation Age of Onset   Cirrhosis Mother    Antithrombin III deficiency Mother        multiple emboli   Ulcerative colitis Mother    Diabetes Father    Coronary artery disease  Father    Heart attack Father    Hypertension Father    Kidney disease Father    Hypertension Sister    Diabetes Sister    Heart attack Sister        age 76    Protein S deficiency Daughter    Heart attack Brother    Hypertension Brother    Lung cancer Brother    Stroke Neg Hx    Colitis Neg Hx    Colon polyps Neg Hx    Esophageal cancer Neg Hx    Liver cancer Neg Hx    Pancreatic cancer Neg Hx    Rectal cancer Neg Hx    Stomach cancer Neg Hx    Breast cancer Neg Hx    Social History   Socioeconomic History   Marital status: Married    Spouse name: Jomarie Longs   Number of children: Not on file   Years of education: Not on file   Highest education level: Some college, no degree  Occupational  History   Not on file  Tobacco Use   Smoking status: Never   Smokeless tobacco: Never  Vaping Use   Vaping status: Never Used  Substance and Sexual Activity   Alcohol use: No   Drug use: Never   Sexual activity: Not on file    Comment: Hysterectomy  Other Topics Concern   Not on file  Social History Narrative   Lives with spouse in [place].  2 grown daughters.   Retired Print production planner     Social Determinants of Health   Financial Resource Strain: Low Risk  (03/30/2023)   Overall Financial Resource Strain (CARDIA)    Difficulty of Paying Living Expenses: Not hard at all  Food Insecurity: No Food Insecurity (03/30/2023)   Hunger Vital Sign    Worried About Running Out of Food in the Last Year: Never true    Ran Out of Food in the Last Year: Never true  Transportation Needs: No Transportation Needs (03/30/2023)   PRAPARE - Administrator, Civil Service (Medical): No    Lack of Transportation (Non-Medical): No  Physical Activity: Sufficiently Active (03/30/2023)   Exercise Vital Sign    Days of Exercise per Week: 7 days    Minutes of Exercise per Session: 30 min  Stress: No Stress Concern Present (03/30/2023)   [employer] of Occupational Health - Occupational  Stress Questionnaire    Feeling of Stress : Not at all  Social Connections: Socially Integrated (03/30/2023)   Social Connection and Isolation Panel [NHANES]    Frequency of Communication with Friends and Family: More than three times a week    Frequency of Social Gatherings with Friends and Family: More than three times a week    Attends Religious Services: More than 4 times per year    Active Member of [employer] or Organizations: Yes    Attends Engineer, structural: More than 4 times per year    Marital Status: Married    Objective:  BP 136/72   Pulse 71   Temp (!) 97.5 F (36.4 C)   Ht 5\' 1"  (1.549 m)   Wt 148 lb (67.1 kg)   SpO2 97%   BMI 27.96 kg/m      07/17/2023    1:38 PM 06/28/2023    8:55 AM 06/06/2023   10:15 AM  BP/Weight  Systolic BP 136 134 145  Diastolic BP 72 60 63  Wt. (Lbs) 148 148.2   BMI 27.96 kg/m2 28 kg/m2     Physical Exam Vitals reviewed.  Constitutional:      Appearance: Normal appearance. She is normal weight.  Neck:     Vascular: No carotid bruit.  Cardiovascular:     Rate and Rhythm: Normal rate and regular rhythm.     Heart sounds: Normal heart sounds.  Pulmonary:     Effort: Pulmonary effort is normal. No respiratory distress.     Breath sounds: Normal breath sounds.  Abdominal:     General: Abdomen is flat. Bowel sounds are normal.     Palpations: Abdomen is soft.     Tenderness: There is no abdominal tenderness.  Neurological:     Mental Status: She is alert and oriented to person, place, and time.  Psychiatric:        Mood and Affect: Mood normal.        Behavior: Behavior normal.     Diabetic Foot Exam - Simple   Simple Foot Form Diabetic Foot exam was performed with the  following findings: Yes 07/17/2023  1:30 PM  Visual Inspection No deformities, no ulcerations, no other skin breakdown bilaterally: Yes Sensation Testing Intact to touch and monofilament testing bilaterally: Yes Pulse Check Posterior Tibialis and  Dorsalis pulse intact bilaterally: Yes Comments      Lab Results  Component Value Date   WBC 9.8 07/17/2023   HGB 13.1 07/17/2023   HCT 40.1 07/17/2023   PLT 277 07/17/2023   GLUCOSE 180 (H) 07/17/2023   CHOL 154 04/13/2023   TRIG 67 04/13/2023   HDL 79 04/13/2023   LDLCALC 62 04/13/2023   ALT 33 (H) 07/17/2023   AST 33 07/17/2023   NA 135 07/17/2023   K 5.4 (H) 07/17/2023   CL 97 07/17/2023   CREATININE 1.04 (H) 07/17/2023   BUN 25 07/17/2023   CO2 24 07/17/2023   TSH 1.040 03/27/2023   INR 4.2 05/22/2023   HGBA1C 6.8 (H) 07/17/2023   MICROALBUR 10 06/15/2021      Assessment & Plan:    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  No changes to medicines. Continue  Lisinopril 20 mg 2 daily, Amlodipine 5 mg daily, Aspirin 81 mg daily, Isosorbide 30 mg daily. Continue to work on eating a healthy diet and exercise.  Labs reviewed today.    Orders: -     CBC with Differential/Platelet  Gastroesophageal reflux disease without esophagitis Assessment & Plan: Well-controlled.  Continue protonix 40 mg daily.     Type 2 diabetes mellitus with diabetic neuropathy, with long-term current use of insulin (HCC) Assessment & Plan: Control: fair. Sees endocrinology.  Recommend check sugars before meals and before bed. Has CGM. Recommend check feet daily. Recommend annual eye exams. Medicines: Farxiga 10 mg daily, Toujeo 6 units daily.   NovoLog 4 unites for breakfast and lunch, 5 units supper, trulicity 0.75 weekly  Continue to work on eating a healthy diet and exercise.  Labs reviewed today   Orders: -     Hemoglobin A1c -     Microalbumin / creatinine urine ratio  Subclinical hypothyroidism Assessment & Plan: Continue on levothyroxine 25 mcg once daily     Mixed hyperlipidemia Assessment & Plan: Well controlled.  No changes to medicines. Continue atorvastatin 80 mg before bed.  Continue to  work on eating a healthy diet and exercise.  Labs reviewed  Orders: -     Comprehensive metabolic panel  Acquired thrombophilia (HCC) Assessment & Plan: Secondary to coumadin.   Encounter for immunization -     Flu Vaccine Trivalent High Dose (Fluad) -     Pfizer Comirnaty Covid-19 Vaccine 20yrs & older  Essential hypertension -     Lisinopril; TAKE 1 TABLETS BY MOUTH TWICE DAILY  Dispense: 180 tablet; Refill: 3  Primary hypertension -     Lisinopril; TAKE 1 TABLETS BY MOUTH TWICE DAILY  Dispense: 180 tablet; Refill: 3  Cramps of left lower extremity -     Lisinopril; TAKE 1 TABLETS BY MOUTH TWICE DAILY  Dispense: 180 tablet; Refill: 3  Other orders -     Zolpidem Tartrate; Take 1 tablet (5 mg total) by mouth at bedtime as needed for sleep.  Dispense: 30 tablet; Refill: 2     Meds ordered this encounter  Medications   lisinopril (ZESTRIL) 20 MG tablet    Sig: TAKE 1 TABLETS BY MOUTH TWICE DAILY    Dispense:  180 tablet    Refill:  3   zolpidem (  AMBIEN) 5 MG tablet    Sig: Take 1 tablet (5 mg total) by mouth at bedtime as needed for sleep.    Dispense:  30 tablet    Refill:  2    Orders Placed This Encounter  Procedures   Flu Vaccine Trivalent High Dose (Fluad)   Pfizer Comirnaty Covid-19 Vaccine 73yrs & older   CBC with Differential/Platelet   Comprehensive metabolic panel   Hemoglobin A1c   Microalbumin / creatinine urine ratio     Follow-up: Return in about 3 months (around 10/17/2023) for chronic follow up.   I,Katherina A Bramblett,acting as a scribe for Blane Ohara, MD.,have documented all relevant documentation on the behalf of Blane Ohara, MD,as directed by  Blane Ohara, MD while in the presence of Blane Ohara, MD.   An After Visit Summary was printed and given to the patient.  Blane Ohara, MD [facility] [phone]

## 2023-07-17 NOTE — Telephone Encounter (Signed)
Pt LVM on nurse line that her INR this morning was 2.1.

## 2023-07-18 ENCOUNTER — Encounter: Payer: Self-pay | Admitting: Family Medicine

## 2023-07-18 ENCOUNTER — Other Ambulatory Visit: Payer: Medicare PPO

## 2023-07-18 DIAGNOSIS — N1831 Chronic kidney disease, stage 3a: Secondary | ICD-10-CM | POA: Diagnosis not present

## 2023-07-18 LAB — CBC WITH DIFFERENTIAL/PLATELET
Basophils Absolute: 0 10*3/uL (ref 0.0–0.2)
Basos: 0 %
EOS (ABSOLUTE): 0 10*3/uL (ref 0.0–0.4)
Eos: 0 %
Hematocrit: 40.1 % (ref 34.0–46.6)
Hemoglobin: 13.1 g/dL (ref 11.1–15.9)
Immature Grans (Abs): 0 10*3/uL (ref 0.0–0.1)
Immature Granulocytes: 0 %
Lymphocytes Absolute: 1.6 10*3/uL (ref 0.7–3.1)
Lymphs: 16 %
MCH: 30.7 pg (ref 26.6–33.0)
MCHC: 32.7 g/dL (ref 31.5–35.7)
MCV: 94 fL (ref 79–97)
Monocytes Absolute: 0.7 10*3/uL (ref 0.1–0.9)
Monocytes: 7 %
Neutrophils Absolute: 7.4 10*3/uL — ABNORMAL HIGH (ref 1.4–7.0)
Neutrophils: 77 %
Platelets: 277 10*3/uL (ref 150–450)
RBC: 4.27 x10E6/uL (ref 3.77–5.28)
RDW: 12.3 % (ref 11.7–15.4)
WBC: 9.8 10*3/uL (ref 3.4–10.8)

## 2023-07-18 LAB — MICROALBUMIN / CREATININE URINE RATIO
Creatinine, Urine: 19.1 mg/dL
Microalb/Creat Ratio: 88 mg/g{creat} — ABNORMAL HIGH (ref 0–29)
Microalbumin, Urine: 16.8 ug/mL

## 2023-07-18 LAB — COMPREHENSIVE METABOLIC PANEL
ALT: 33 [IU]/L — ABNORMAL HIGH (ref 0–32)
AST: 33 [IU]/L (ref 0–40)
Albumin: 4 g/dL (ref 3.8–4.8)
Alkaline Phosphatase: 150 [IU]/L — ABNORMAL HIGH (ref 44–121)
BUN/Creatinine Ratio: 24 (ref 12–28)
BUN: 25 mg/dL (ref 8–27)
Bilirubin Total: 0.4 mg/dL (ref 0.0–1.2)
CO2: 24 mmol/L (ref 20–29)
Calcium: 9.4 mg/dL (ref 8.7–10.3)
Chloride: 97 mmol/L (ref 96–106)
Creatinine, Ser: 1.04 mg/dL — ABNORMAL HIGH (ref 0.57–1.00)
Globulin, Total: 2.5 g/dL (ref 1.5–4.5)
Glucose: 180 mg/dL — ABNORMAL HIGH (ref 70–99)
Potassium: 5.4 mmol/L — ABNORMAL HIGH (ref 3.5–5.2)
Sodium: 135 mmol/L (ref 134–144)
Total Protein: 6.5 g/dL (ref 6.0–8.5)
eGFR: 55 mL/min/{1.73_m2} — ABNORMAL LOW (ref >59)

## 2023-07-18 LAB — HEMOGLOBIN A1C
Est. average glucose Bld gHb Est-mCnc: 148 mg/dL
Hgb A1c MFr Bld: 6.8 % — ABNORMAL HIGH (ref 4.8–5.6)

## 2023-07-18 NOTE — Assessment & Plan Note (Signed)
Continue on levothyroxine 25 mcg once daily

## 2023-07-18 NOTE — Assessment & Plan Note (Signed)
Well controlled.  Continue protonix 40 mg daily.

## 2023-07-18 NOTE — Assessment & Plan Note (Signed)
Well controlled.  No changes to medicines. Continue atorvastatin 80 mg before bed.  Continue to work on eating a healthy diet and exercise.  Labs reviewed

## 2023-07-18 NOTE — Assessment & Plan Note (Signed)
Control: fair. Sees endocrinology.  Recommend check sugars before meals and before bed. Has CGM. Recommend check feet daily. Recommend annual eye exams. Medicines: Farxiga 10 mg daily, Toujeo 6 units daily.   NovoLog 4 unites for breakfast and lunch, 5 units supper, trulicity 0.75 weekly  Continue to work on eating a healthy diet and exercise.  Labs reviewed today

## 2023-07-18 NOTE — Assessment & Plan Note (Signed)
Well controlled.  No changes to medicines. Continue  Lisinopril 20 mg 2 daily, Amlodipine 5 mg daily, Aspirin 81 mg daily, Isosorbide 30 mg daily. Continue to work on eating a healthy diet and exercise.  Labs reviewed today

## 2023-07-18 NOTE — Assessment & Plan Note (Signed)
Secondary to coumadin. 

## 2023-07-19 DIAGNOSIS — E1122 Type 2 diabetes mellitus with diabetic chronic kidney disease: Secondary | ICD-10-CM | POA: Diagnosis not present

## 2023-07-19 DIAGNOSIS — Z6826 Body mass index (BMI) 26.0-26.9, adult: Secondary | ICD-10-CM | POA: Diagnosis not present

## 2023-07-19 DIAGNOSIS — D509 Iron deficiency anemia, unspecified: Secondary | ICD-10-CM | POA: Diagnosis not present

## 2023-07-19 DIAGNOSIS — N1831 Chronic kidney disease, stage 3a: Secondary | ICD-10-CM | POA: Diagnosis not present

## 2023-07-19 DIAGNOSIS — M48062 Spinal stenosis, lumbar region with neurogenic claudication: Secondary | ICD-10-CM | POA: Diagnosis not present

## 2023-07-19 DIAGNOSIS — I129 Hypertensive chronic kidney disease with stage 1 through stage 4 chronic kidney disease, or unspecified chronic kidney disease: Secondary | ICD-10-CM | POA: Diagnosis not present

## 2023-07-19 DIAGNOSIS — N2581 Secondary hyperparathyroidism of renal origin: Secondary | ICD-10-CM | POA: Diagnosis not present

## 2023-07-20 ENCOUNTER — Other Ambulatory Visit: Payer: Self-pay | Admitting: Family Medicine

## 2023-07-20 ENCOUNTER — Ambulatory Visit: Payer: Medicare PPO | Admitting: Family Medicine

## 2023-07-20 DIAGNOSIS — R9389 Abnormal findings on diagnostic imaging of other specified body structures: Secondary | ICD-10-CM | POA: Insufficient documentation

## 2023-07-20 DIAGNOSIS — R252 Cramp and spasm: Secondary | ICD-10-CM | POA: Insufficient documentation

## 2023-07-20 DIAGNOSIS — N1832 Chronic kidney disease, stage 3b: Secondary | ICD-10-CM

## 2023-07-20 DIAGNOSIS — Z23 Encounter for immunization: Secondary | ICD-10-CM | POA: Insufficient documentation

## 2023-07-20 HISTORY — DX: Encounter for immunization: Z23

## 2023-07-20 HISTORY — DX: Abnormal findings on diagnostic imaging of other specified body structures: R93.89

## 2023-07-20 NOTE — Addendum Note (Signed)
Addended byBlane Ohara on: 07/20/2023 12:05 AM   Modules accepted: Orders

## 2023-07-21 DIAGNOSIS — N1831 Chronic kidney disease, stage 3a: Secondary | ICD-10-CM | POA: Diagnosis not present

## 2023-07-24 ENCOUNTER — Telehealth: Payer: Self-pay | Admitting: Cardiology

## 2023-07-24 ENCOUNTER — Telehealth: Payer: Self-pay

## 2023-07-24 LAB — POCT INR: POC INR: 2.2

## 2023-07-24 NOTE — Telephone Encounter (Signed)
Pt LVM on nurse line that her INR is 2.2 this morning. She is currently taking coumadin 5mg  qd on Tue & Sat, 4mg  qd all other days of the week.

## 2023-07-24 NOTE — Telephone Encounter (Signed)
Pt is calling to get results from her heart monitor. Advised her message would be sent to Dr Servando Salina.

## 2023-07-24 NOTE — Telephone Encounter (Signed)
Patient is requesting call back to get results or update of monitor. Please advise.

## 2023-07-26 ENCOUNTER — Encounter: Payer: Self-pay | Admitting: Hematology and Oncology

## 2023-07-27 ENCOUNTER — Other Ambulatory Visit: Payer: Self-pay | Admitting: Cardiology

## 2023-07-27 DIAGNOSIS — R252 Cramp and spasm: Secondary | ICD-10-CM

## 2023-07-31 ENCOUNTER — Telehealth: Payer: Self-pay | Admitting: Family Medicine

## 2023-07-31 ENCOUNTER — Telehealth: Payer: Self-pay

## 2023-07-31 ENCOUNTER — Other Ambulatory Visit: Payer: Self-pay

## 2023-07-31 DIAGNOSIS — R252 Cramp and spasm: Secondary | ICD-10-CM

## 2023-07-31 NOTE — Telephone Encounter (Signed)
Spoke to [facility], they will get medication ready and make patient aware.

## 2023-07-31 NOTE — Telephone Encounter (Signed)
Pt called to report her INR is 2.5 this morning.

## 2023-07-31 NOTE — Telephone Encounter (Signed)
Pt is calling back in needing dr cox to approve her lisinopril (ZESTRIL) 20 MG tablet  she also would like for it to be put on automatic refill for her

## 2023-08-02 ENCOUNTER — Other Ambulatory Visit: Payer: Self-pay | Admitting: Cardiology

## 2023-08-02 DIAGNOSIS — I10 Essential (primary) hypertension: Secondary | ICD-10-CM

## 2023-08-02 DIAGNOSIS — R252 Cramp and spasm: Secondary | ICD-10-CM

## 2023-08-05 ENCOUNTER — Other Ambulatory Visit: Payer: Self-pay | Admitting: Hematology and Oncology

## 2023-08-05 ENCOUNTER — Other Ambulatory Visit: Payer: Self-pay | Admitting: Cardiology

## 2023-08-05 DIAGNOSIS — R252 Cramp and spasm: Secondary | ICD-10-CM

## 2023-08-05 DIAGNOSIS — I10 Essential (primary) hypertension: Secondary | ICD-10-CM

## 2023-08-07 ENCOUNTER — Telehealth: Payer: Self-pay

## 2023-08-07 ENCOUNTER — Ambulatory Visit: Payer: Medicare PPO | Attending: Cardiology | Admitting: Cardiology

## 2023-08-07 ENCOUNTER — Encounter: Payer: Self-pay | Admitting: Cardiology

## 2023-08-07 VITALS — BP 131/67 | HR 42 | Ht 61.0 in | Wt 143.0 lb

## 2023-08-07 DIAGNOSIS — I110 Hypertensive heart disease with heart failure: Secondary | ICD-10-CM | POA: Diagnosis not present

## 2023-08-07 DIAGNOSIS — I493 Ventricular premature depolarization: Secondary | ICD-10-CM

## 2023-08-07 DIAGNOSIS — I5030 Unspecified diastolic (congestive) heart failure: Secondary | ICD-10-CM

## 2023-08-07 DIAGNOSIS — I480 Paroxysmal atrial fibrillation: Secondary | ICD-10-CM | POA: Diagnosis not present

## 2023-08-07 DIAGNOSIS — I5032 Chronic diastolic (congestive) heart failure: Secondary | ICD-10-CM | POA: Diagnosis not present

## 2023-08-07 MED ORDER — DILTIAZEM HCL ER COATED BEADS 180 MG PO CP24: 180.0000 mg | ORAL_CAPSULE | Freq: Every day | ORAL | 3 refills | Status: DC

## 2023-08-07 NOTE — Telephone Encounter (Addendum)
Pt called to repot that her INR is 3.0 today. I will notify Dr Melvyn Neth. Dr Melvyn Neth recommends pt stay on same coumadin regimen.

## 2023-08-07 NOTE — Patient Instructions (Addendum)
Medication Instructions:  Your physician has recommended you make the following change in your medication:  STOP: Amlodipine  START: Cardizem 180 mg once daily *If you need a refill on your cardiac medications before your next appointment, please call your pharmacy*   Follow-Up: At [facility], you and your health needs are our priority.  As part of our continuing mission to provide you with exceptional heart care, we have created designated Provider Care Teams.  These Care Teams include your primary Cardiologist (physician) and Advanced Practice Providers (APPs -  Physician Assistants and Nurse Practitioners) who all work together to provide you with the care you need, when you need it.   Your next appointment:   3 month(s)  Provider:   Thomasene Ripple, DO

## 2023-08-07 NOTE — Progress Notes (Signed)
Virtual Visit via Video Note   Because of Pamela [name]'s co-morbid illnesses, she is at least at moderate risk for complications without adequate follow up.  This format is felt to be most appropriate for this patient at this time.  All issues noted in this document were discussed and addressed.  A limited physical exam was performed with this format.  Please refer to the patient's chart for her consent to telehealth for [facility].      Date:  08/07/2023   ID:  Pamela [name], DOB Aug 22, 1946, MRN [id number]  Patient Location: Home Provider Location: Office/Clinic  PCP:  Blane Ohara, MD  Cardiologist:  Thomasene Ripple, DO  Electrophysiologist:  None   Evaluation Performed:  Follow-Up Visit  Chief Complaint:  " I am still having these palpitations"  History of Present Illness:    Pamela [name] is a 77 y.o. 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    At her last visit she was experiencing significant lightheadedness and dizziness. She also reported palpitations.    The patient does not have symptoms concerning for COVID-19 infection (fever, chills, cough, or new shortness of breath).    Past Medical History:  Diagnosis Date   Anticoagulated on Coumadin    chronic--- managed by hematology/ oncology,   Asthma, mild intermittent    followed by pcp--- per pt last exacerbation winter 2021 w/ acute bronchitis   Bilateral leg cramps    Blood dyscrasia    Chronic constipation    CKD (chronic kidney disease), stage III (HCC)    Closed bimalleolar fracture of left ankle 02/26/2020   DDD (degenerative disc disease), cervical    w/ spondylosis,  per pt last steroid injection 06/ 2022   DDD  (degenerative disc disease), lumbosacral    DVT (deep venous thrombosis) (HCC) 07/30/2013   Dyspnea    occasionally   Dysrhythmia    GERD (gastroesophageal reflux disease)    History of cardiac murmur as a child    History of DVT of lower extremity    left lower extremity in 1980s, fell when bowling   History of pulmonary embolus (PE) 1993   per pt left lung post op 2 wks cholecystectomy   History of rheumatic fever as a child    per last echo 12-31-2019 no valvular issues   History of TIA (transient ischemic attack)    2014 and 2018 or 2019,  per pt no residual   HTN (hypertension)    followed by pcp   Hyperlipidemia    Hypothyroidism    IDA (iron deficiency anemia)    Macular degeneration of both eyes    Mild obstructive sleep apnea    per pt dx 2017 tried to uses cpap but intolerant   Mixed incontinence urge and stress    urologist--- dr Saddie Benders   OA (osteoarthritis) 07/06/2018   Osteoporosis    PAF (paroxysmal atrial fibrillation) (HCC) 10/01/2014   cardiologist--- dr Lavona Mound Brayten Komar;   cardiac cath 02-18-2013 normal coronaries arteries, ef 50%, cath done since echo showed ef 30-35%; nucleat stress study 04/ 2020 normal , normal echo 04/ 2021,  event monitor 09-14-2020 rare ST/AT variable block   Pneumonia    PONV (postoperative nausea and vomiting)    Protein S deficiency (  HCC)    followed by hemotology/ oncology-- dr d. Melvyn Neth ([facility]) dx 1980s;  prior DVT left lower leg 1980s and left lung PE 1993; chronic  coumadin since 1980s   PVC's (premature ventricular contractions)    followed by cardiology   S/P cardiac catheterization 02/2013   Normal coronaries; low normal EF at 50%   Solitary pulmonary nodule on lung CT 02/06/2019   First noted 01/13/2014 > no change as of 12/21/2018   Spondylolisthesis, lumbar region 08/09/2018   Stroke (HCC)    TIA in 2018 or 2019   Transient ischemic attack 07/30/2013   Type 2 diabetes mellitus treated with insulin (HCC)     endocrilogist--- whitney reardon NP     (03-10-2021  pt continuously checks blood sugar throughout the day w/ Libre, fasting sugar --- 69--200)   Wears glasses    Wears hearing aid in both ears    Past Surgical History:  Procedure Laterality Date   ABDOMINAL HYSTERECTOMY     BLEPHAROPLASTY     CARDIAC CATHETERIZATION  02/18/2013   @ [facility]  by Dr [place];  normal coronaries w/ preserved LVF, ef 50%;   previous cath 2001 normal ef 65%   CARPAL TUNNEL RELEASE Bilateral 1994   CATARACT EXTRACTION W/ INTRAOCULAR LENS IMPLANT Bilateral 2017   CHOLECYSTECTOMY     CHOLECYSTECTOMY, LAPAROSCOPIC  1993   COLONOSCOPY     EAR BIOPSY Left    FINGER SURGERY Left 2018   thumb   FOOT SURGERY     FOOT TENDON SURGERY Right    early 2000s   FRACTURE SURGERY Left 02/13/2020   left femur   HAND SURGERY Right 04/2022   HARDWARE REMOVAL Left 03/12/2021   Procedure: HARDWARE REMOVAL;  Surgeon: Yolonda Kida, MD;  Location: [facility];  Service: Orthopedics;  Laterality: Left;  60 MINS   JOINT REPLACEMENT     LUMBAR LAMINECTOMY/DECOMPRESSION MICRODISCECTOMY N/A 12/22/2022   Procedure: Lumbar Two-Lumbar Three, Lumbar Three-Lumbar Four Lumbar Decompression;  Surgeon: Barnett Abu, MD;  Location: [facility] OR;  Service: Neurosurgery;  Laterality: N/A;  RM 20 3C   ORIF ANKLE FRACTURE Left 02/26/2020   Procedure: OPEN REDUCTION INTERNAL FIXATION (ORIF) ANKLE FRACTURE;  Surgeon: Yolonda Kida, MD;  Location: [facility] OR;  Service: Orthopedics;  Laterality: Left;  90 mins   TONSILLECTOMY AND ADENOIDECTOMY Bilateral    TOTAL KNEE ARTHROPLASTY Left 03/2005   TOTAL VAGINAL HYSTERECTOMY  1988   per pt still has ovaries     Current Meds  Medication Sig   acetaminophen (TYLENOL) 650 MG CR tablet Take 650-1,300 mg by mouth every 8 (eight) hours as needed for pain.   albuterol (VENTOLIN HFA) 108 (90 Base) MCG/ACT inhaler INHALE 1 TO 2 PUFFS INTO THE LUNGS EVERY 6 HOURS AS NEEDED FOR WHEEZING OR  SHORTNESS OF BREATH   aspirin EC (ASPIRIN LOW DOSE) 81 MG tablet TAKE 1 TABLET(81 MG) BY MOUTH DAILY   atorvastatin (LIPITOR) 80 MG tablet TAKE 1 TABLET(80 MG) BY MOUTH DAILY   carboxymethylcellulose (REFRESH PLUS) 0.5 % SOLN Place 1 drop into both eyes 3 (three) times daily as needed (dry eyes).   Cholecalciferol (VITAMIN D3) 50 MCG (2000 UT) TABS Take 4,000 Units by mouth daily with lunch.   Continuous Glucose Sensor (FREESTYLE LIBRE 2 SENSOR) MISC 2 each by Does not apply route every 14 (fourteen) days. E11.40   cyanocobalamin (VITAMIN B12) 1000 MCG tablet Take 1,000 mcg by mouth daily.   dapagliflozin propanediol (FARXIGA) 10  MG TABS tablet Take 1 tablet (10 mg total) by mouth daily before breakfast.   diltiazem (CARDIZEM CD) 180 MG 24 hr capsule Take 1 capsule (180 mg total) by mouth daily.   Dulaglutide (TRULICITY) 0.75 MG/0.5ML SOPN Inject 0.75 mg into the skin once a week.   estradiol (ESTRACE) 0.1 MG/GM vaginal cream Place 1 Applicatorful vaginally 2 (two) times a week. As needed   furosemide (LASIX) 40 MG tablet TAKE 1 TABLET(40 MG) BY MOUTH DAILY   GEMTESA 75 MG TABS Take 75 mg by mouth at bedtime.   insulin aspart (NOVOLOG FLEXPEN) 100 UNIT/ML FlexPen Max daily 30 units   insulin glargine, 1 Unit Dial, (TOUJEO) 300 UNIT/ML Solostar Pen Inject 6 Units into the skin at bedtime.   Insulin Pen Needle (BD PEN NEEDLE NANO 2ND GEN) 32G X 4 MM MISC USE TO INJECT INSULIN four times DAILY   isosorbide mononitrate (IMDUR) 30 MG 24 hr tablet TAKE 1 TABLET BY MOUTH DAILY. PLEASE ARRANGE APPT FOR FURTHER REFILLS   lisinopril (ZESTRIL) 20 MG tablet TAKE 1 TABLETS BY MOUTH TWICE DAILY   magnesium oxide (MAG-OX) 400 (240 Mg) MG tablet TAKE 2 TABLETS(800 MG) BY MOUTH TWICE DAILY (Patient taking differently: Take 800 mg by mouth 2 (two) times daily.)   Multiple Vitamins-Minerals (PRESERVISION AREDS PO) Take 1 capsule by mouth 2 (two) times daily.   nystatin cream (MYCOSTATIN) Apply 1 Application  topically 4 (four) times daily as needed for dry skin (yeast infection).   ondansetron (ZOFRAN-ODT) 8 MG disintegrating tablet Take 8 mg by mouth every 8 (eight) hours as needed for nausea or vomiting.    pantoprazole (PROTONIX) 40 MG tablet TAKE 1 TABLET(40 MG) BY MOUTH EVERY MORNING   polyethylene glycol powder (MIRALAX) 17 GM/SCOOP powder Take 17 g by mouth daily as needed for moderate constipation.   pyridoxine (B-6) 100 MG tablet Take 100 mg by mouth daily.   sennosides-docusate sodium (SENOKOT-S) 8.6-50 MG tablet Take 1 tablet by mouth daily.   sodium chloride (OCEAN) 0.65 % SOLN nasal spray Place 1 spray into both nostrils as needed for congestion.   warfarin (COUMADIN) 1 MG tablet Take 1 mg by mouth every evening. Take with 5 mg tab to get 6 mg   warfarin (COUMADIN) 3 MG tablet Take 1 tablet (3 mg total) by mouth daily.   warfarin (COUMADIN) 4 MG tablet Take 1 tablet (4 mg total) by mouth daily.   warfarin (COUMADIN) 5 MG tablet TAKE 1 TABLET BY MOUTH EVERY DAY   zolpidem (AMBIEN) 5 MG tablet Take 1 tablet (5 mg total) by mouth at bedtime as needed for sleep.   [DISCONTINUED] amLODipine (NORVASC) 5 MG tablet TAKE 1 TABLET(5 MG) BY MOUTH DAILY     Allergies:   Ketek [telithromycin], Loxapine succinate, Naproxen sodium, Amoxapine and related, Darvon, Belsomra [suvorexant], Cymbalta [duloxetine hcl], Duloxetine, Gabapentin, Lyrica [pregabalin], Ozempic (0.25 or 0.5 mg-dose) [semaglutide(0.25 or 0.5mg -dos)], Semaglutide, Amoxicillin, and Propoxyphene   Social History   Tobacco Use   Smoking status: Never   Smokeless tobacco: Never  Vaping Use   Vaping status: Never Used  Substance Use Topics   Alcohol use: No   Drug use: Never     Family Hx: The patient's family history includes Antithrombin III deficiency in her mother; Cirrhosis in her mother; Coronary artery disease in her father; Diabetes in her father and sister; Heart attack in her brother, father, and sister; Hypertension in  her brother, father, and sister; Kidney disease in her father; Lung cancer  in her brother; Protein S deficiency in her daughter; Ulcerative colitis in her mother. There is no history of Stroke, Colitis, Colon polyps, Esophageal cancer, Liver cancer, Pancreatic cancer, Rectal cancer, Stomach cancer, or Breast cancer.  ROS:   Please see the history of present illness.    Palpitations , lightheadedness  All other systems reviewed and are negative.   Prior CV studies:   The following studies were reviewed today:  Zio, PET, echo  Labs/Other Tests and Data Reviewed:    EKG:  No ECG reviewed.  Recent Labs: 03/27/2023: TSH 1.040 07/17/2023: ALT 33; BUN 25; Creatinine, Ser 1.04; Hemoglobin 13.1; Platelets 277; Potassium 5.4; Sodium 135   Recent Lipid Panel Lab Results  Component Value Date/Time   CHOL 154 04/13/2023 08:07 AM   TRIG 67 04/13/2023 08:07 AM   HDL 79 04/13/2023 08:07 AM   CHOLHDL 1.9 04/13/2023 08:07 AM   LDLCALC 62 04/13/2023 08:07 AM    Wt Readings from Last 3 Encounters:  08/07/23 143 lb (64.9 kg)  07/17/23 148 lb (67.1 kg)  06/28/23 148 lb 3.2 oz (67.2 kg)     Objective:    Vital Signs:  BP 131/67 (BP Location: Left Arm, Patient Position: Sitting, Cuff Size: Normal)   Pulse (!) 42   Ht 5\' 1"  (1.549 m)   Wt 143 lb (64.9 kg)   SpO2 98%   BMI 27.02 kg/m      ASSESSMENT & PLAN:    NSVT Frequent PVC  Mild CAD  PAF on warfarin   We discussed her monitor result which showed frequent PVCs as well as some episodes of NSVT.  At this time what I would do will stop the amlodipine and start the patient on Cardizem 180 mg daily.  Will limited for antiarrhythmics here but she has a recent scheduled appointment with pulmonary to understand if she does in fact have pulmonary fibrosis.  If not we can consider amiodarone.  No angina symptoms  Known Paroxysmal atrial fibrillation.  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  COVID-19  Education: The signs and symptoms of COVID-19 were discussed with the patient and how to seek care for testing (follow up with PCP or arrange E-visit).  The importance of social distancing was discussed today.  Time:   Today, I have spent 15 minutes with the patient with telehealth technology discussing the above problems.     Medication Adjustments/Labs and Tests Ordered: Current medicines are reviewed at length with the patient today.  Concerns regarding medicines are outlined above.   Tests Ordered: No orders of the defined types were placed in this encounter.   Medication Changes: Meds ordered this encounter  Medications   diltiazem (CARDIZEM CD) 180 MG 24 hr capsule    Sig: Take 1 capsule (180 mg total) by mouth daily.    Dispense:  90 capsule    Refill:  3    Follow Up:  In Person in 4 month(s)  Signed, Thomasene Ripple, DO  08/07/2023 3:12 PM    [facility]

## 2023-08-14 ENCOUNTER — Telehealth: Payer: Self-pay

## 2023-08-14 NOTE — Telephone Encounter (Signed)
Pt's INR this morning is 2.3 (remains on Coumadin 5mg  po qd on Tue;  4mg  daily all other days).

## 2023-08-15 DIAGNOSIS — M47815 Spondylosis without myelopathy or radiculopathy, thoracolumbar region: Secondary | ICD-10-CM | POA: Diagnosis not present

## 2023-08-15 DIAGNOSIS — M4856XA Collapsed vertebra, not elsewhere classified, lumbar region, initial encounter for fracture: Secondary | ICD-10-CM | POA: Diagnosis not present

## 2023-08-15 DIAGNOSIS — M4319 Spondylolisthesis, multiple sites in spine: Secondary | ICD-10-CM | POA: Diagnosis not present

## 2023-08-15 DIAGNOSIS — M48062 Spinal stenosis, lumbar region with neurogenic claudication: Secondary | ICD-10-CM | POA: Diagnosis not present

## 2023-08-21 ENCOUNTER — Telehealth: Payer: Self-pay

## 2023-08-21 NOTE — Telephone Encounter (Signed)
Pt LVM on nurse line,that her INR result is 4.5. "Tell Kelli I'll eat a big bowl of cabbage tomorrow and it will bring it right down".  I notified Dr Melvyn Neth of INR [place], not her today). He recommends she hold her coumadin for 2 days.

## 2023-08-22 DIAGNOSIS — D6859 Other primary thrombophilia: Secondary | ICD-10-CM | POA: Diagnosis not present

## 2023-08-22 DIAGNOSIS — Z7901 Long term (current) use of anticoagulants: Secondary | ICD-10-CM | POA: Diagnosis not present

## 2023-08-22 DIAGNOSIS — E1140 Type 2 diabetes mellitus with diabetic neuropathy, unspecified: Secondary | ICD-10-CM | POA: Diagnosis not present

## 2023-08-28 ENCOUNTER — Telehealth: Payer: Self-pay

## 2023-08-28 NOTE — Telephone Encounter (Signed)
Pt's INR is 2.5 today. She is currently taking coumadin 5mg  on Tue, & Sat; coumadin 4mg  fd all other days of week.

## 2023-09-04 ENCOUNTER — Telehealth: Payer: Self-pay

## 2023-09-04 DIAGNOSIS — M48062 Spinal stenosis, lumbar region with neurogenic claudication: Secondary | ICD-10-CM | POA: Diagnosis not present

## 2023-09-04 DIAGNOSIS — Z6826 Body mass index (BMI) 26.0-26.9, adult: Secondary | ICD-10-CM | POA: Diagnosis not present

## 2023-09-04 NOTE — Telephone Encounter (Signed)
-----   Message from Pascal Lux sent at 09/04/2023  2:58 PM EST ----- Regarding: RE: INR Notified patient's husband to have patient keep coumadin at the same dosage. Patient had stepped away to the grocery store. ----- Message ----- From: Dyane Dustman, RN Sent: 09/04/2023   2:18 PM EST To: Pascal Lux, NP Subject: INR                                            Her INR today 2.0.

## 2023-09-05 DIAGNOSIS — H532 Diplopia: Secondary | ICD-10-CM | POA: Diagnosis not present

## 2023-09-05 DIAGNOSIS — H353131 Nonexudative age-related macular degeneration, bilateral, early dry stage: Secondary | ICD-10-CM | POA: Diagnosis not present

## 2023-09-05 LAB — HM DIABETES EYE EXAM

## 2023-09-06 DIAGNOSIS — Z8673 Personal history of transient ischemic attack (TIA), and cerebral infarction without residual deficits: Secondary | ICD-10-CM | POA: Insufficient documentation

## 2023-09-06 DIAGNOSIS — M810 Age-related osteoporosis without current pathological fracture: Secondary | ICD-10-CM | POA: Insufficient documentation

## 2023-09-06 DIAGNOSIS — Z86711 Personal history of pulmonary embolism: Secondary | ICD-10-CM | POA: Insufficient documentation

## 2023-09-06 DIAGNOSIS — Z9181 History of falling: Secondary | ICD-10-CM | POA: Insufficient documentation

## 2023-09-06 DIAGNOSIS — D631 Anemia in chronic kidney disease: Secondary | ICD-10-CM | POA: Insufficient documentation

## 2023-09-06 DIAGNOSIS — E785 Hyperlipidemia, unspecified: Secondary | ICD-10-CM | POA: Insufficient documentation

## 2023-09-06 DIAGNOSIS — I088 Other rheumatic multiple valve diseases: Secondary | ICD-10-CM | POA: Insufficient documentation

## 2023-09-06 DIAGNOSIS — Z96652 Presence of left artificial knee joint: Secondary | ICD-10-CM | POA: Insufficient documentation

## 2023-09-06 DIAGNOSIS — R911 Solitary pulmonary nodule: Secondary | ICD-10-CM

## 2023-09-06 DIAGNOSIS — Z87440 Personal history of urinary (tract) infections: Secondary | ICD-10-CM

## 2023-09-06 DIAGNOSIS — M47812 Spondylosis without myelopathy or radiculopathy, cervical region: Secondary | ICD-10-CM

## 2023-09-06 DIAGNOSIS — D6859 Other primary thrombophilia: Secondary | ICD-10-CM | POA: Insufficient documentation

## 2023-09-06 DIAGNOSIS — M5030 Other cervical disc degeneration, unspecified cervical region: Secondary | ICD-10-CM | POA: Insufficient documentation

## 2023-09-06 DIAGNOSIS — Z8701 Personal history of pneumonia (recurrent): Secondary | ICD-10-CM | POA: Insufficient documentation

## 2023-09-06 DIAGNOSIS — I428 Other cardiomyopathies: Secondary | ICD-10-CM | POA: Insufficient documentation

## 2023-09-06 DIAGNOSIS — Z48812 Encounter for surgical aftercare following surgery on the circulatory system: Secondary | ICD-10-CM

## 2023-09-06 DIAGNOSIS — I2510 Atherosclerotic heart disease of native coronary artery without angina pectoris: Secondary | ICD-10-CM

## 2023-09-06 DIAGNOSIS — I420 Dilated cardiomyopathy: Secondary | ICD-10-CM

## 2023-09-06 DIAGNOSIS — H3530 Unspecified macular degeneration: Secondary | ICD-10-CM | POA: Insufficient documentation

## 2023-09-06 DIAGNOSIS — K219 Gastro-esophageal reflux disease without esophagitis: Secondary | ICD-10-CM | POA: Insufficient documentation

## 2023-09-06 DIAGNOSIS — I480 Paroxysmal atrial fibrillation: Secondary | ICD-10-CM | POA: Insufficient documentation

## 2023-09-06 DIAGNOSIS — I493 Ventricular premature depolarization: Secondary | ICD-10-CM

## 2023-09-06 DIAGNOSIS — Z86718 Personal history of other venous thrombosis and embolism: Secondary | ICD-10-CM | POA: Insufficient documentation

## 2023-09-06 DIAGNOSIS — E119 Type 2 diabetes mellitus without complications: Secondary | ICD-10-CM | POA: Insufficient documentation

## 2023-09-06 DIAGNOSIS — M1990 Unspecified osteoarthritis, unspecified site: Secondary | ICD-10-CM | POA: Insufficient documentation

## 2023-09-06 HISTORY — DX: Other rheumatic multiple valve diseases: I08.8

## 2023-09-06 HISTORY — DX: Chronic kidney disease, unspecified: D63.1

## 2023-09-06 HISTORY — DX: Other cardiomyopathies: I42.8

## 2023-09-06 HISTORY — DX: Personal history of urinary (tract) infections: Z87.440

## 2023-09-06 HISTORY — DX: Spondylosis without myelopathy or radiculopathy, cervical region: M47.812

## 2023-09-06 HISTORY — DX: Dilated cardiomyopathy: I42.0

## 2023-09-06 HISTORY — DX: Other primary thrombophilia: D68.59

## 2023-09-06 HISTORY — DX: History of falling: Z91.81

## 2023-09-06 HISTORY — DX: Presence of left artificial knee joint: Z96.652

## 2023-09-06 HISTORY — DX: Solitary pulmonary nodule: R91.1

## 2023-09-06 HISTORY — DX: Atherosclerotic heart disease of native coronary artery without angina pectoris: I25.10

## 2023-09-06 HISTORY — DX: Personal history of pneumonia (recurrent): Z87.01

## 2023-09-06 HISTORY — DX: Encounter for surgical aftercare following surgery on the circulatory system: Z48.812

## 2023-09-06 HISTORY — DX: Ventricular premature depolarization: I49.3

## 2023-09-11 ENCOUNTER — Telehealth: Payer: Self-pay

## 2023-09-11 NOTE — Telephone Encounter (Signed)
 Pt called to report her INR this morning was 2.1. She remains on coumadin 5mg  every day on Wed and Sat. Coumadin 4mg  every day all other days of week.

## 2023-09-12 DIAGNOSIS — H903 Sensorineural hearing loss, bilateral: Secondary | ICD-10-CM | POA: Diagnosis not present

## 2023-09-14 ENCOUNTER — Other Ambulatory Visit: Payer: Self-pay | Admitting: Hematology and Oncology

## 2023-09-14 DIAGNOSIS — D6859 Other primary thrombophilia: Secondary | ICD-10-CM

## 2023-09-18 ENCOUNTER — Telehealth: Payer: Self-pay

## 2023-09-18 ENCOUNTER — Encounter: Payer: Self-pay | Admitting: Hematology and Oncology

## 2023-09-18 NOTE — Telephone Encounter (Signed)
 Patient called to let us know her INR is 2.3 today.

## 2023-09-25 ENCOUNTER — Telehealth: Payer: Self-pay

## 2023-09-25 DIAGNOSIS — D6859 Other primary thrombophilia: Secondary | ICD-10-CM | POA: Diagnosis not present

## 2023-09-25 DIAGNOSIS — Z7901 Long term (current) use of anticoagulants: Secondary | ICD-10-CM | POA: Diagnosis not present

## 2023-09-25 NOTE — Telephone Encounter (Signed)
Pt called to notify us that her INR is 2.2 this morning.

## 2023-09-26 ENCOUNTER — Encounter: Payer: Self-pay | Admitting: Hematology and Oncology

## 2023-09-26 ENCOUNTER — Other Ambulatory Visit: Payer: Self-pay | Admitting: Hematology and Oncology

## 2023-09-26 ENCOUNTER — Telehealth: Payer: Self-pay

## 2023-09-26 ENCOUNTER — Telehealth: Payer: Self-pay | Admitting: Hematology and Oncology

## 2023-09-26 DIAGNOSIS — E119 Type 2 diabetes mellitus without complications: Secondary | ICD-10-CM | POA: Diagnosis not present

## 2023-09-26 DIAGNOSIS — I10 Essential (primary) hypertension: Secondary | ICD-10-CM | POA: Diagnosis not present

## 2023-09-26 DIAGNOSIS — R809 Proteinuria, unspecified: Secondary | ICD-10-CM | POA: Diagnosis not present

## 2023-09-26 DIAGNOSIS — N183 Chronic kidney disease, stage 3 unspecified: Secondary | ICD-10-CM | POA: Diagnosis not present

## 2023-09-26 DIAGNOSIS — R319 Hematuria, unspecified: Secondary | ICD-10-CM | POA: Diagnosis not present

## 2023-09-26 DIAGNOSIS — N390 Urinary tract infection, site not specified: Secondary | ICD-10-CM | POA: Diagnosis not present

## 2023-09-26 MED ORDER — APIXABAN 5 MG PO TABS: 5.0000 mg | ORAL_TABLET | Freq: Two times a day (BID) | ORAL | 2 refills | Status: DC

## 2023-09-26 NOTE — Telephone Encounter (Signed)
Detailed message left for patient to start eliquis and script sent to pharmacy , reviewed with pharmacist safest for he kidneys.

## 2023-09-26 NOTE — Telephone Encounter (Signed)
-----   Message from Adah Perl sent at 09/26/2023  3:53 PM EST ----- Please let her know the pharmacist agreed that Eliquis is the best with her kidney disease.  I sent her in a prescription. Thanks

## 2023-09-26 NOTE — Telephone Encounter (Signed)
Detailed message left for patient.

## 2023-09-27 ENCOUNTER — Ambulatory Visit: Payer: Medicare PPO | Admitting: Internal Medicine

## 2023-09-27 ENCOUNTER — Ambulatory Visit: Payer: Medicare PPO

## 2023-09-27 ENCOUNTER — Other Ambulatory Visit (HOSPITAL_BASED_OUTPATIENT_CLINIC_OR_DEPARTMENT_OTHER): Payer: Self-pay | Admitting: Internal Medicine

## 2023-09-27 ENCOUNTER — Encounter: Payer: Self-pay | Admitting: Internal Medicine

## 2023-09-27 VITALS — BP 155/78 | HR 68 | Ht 60.0 in | Wt 146.6 lb

## 2023-09-27 DIAGNOSIS — R0609 Other forms of dyspnea: Secondary | ICD-10-CM

## 2023-09-27 DIAGNOSIS — N183 Chronic kidney disease, stage 3 unspecified: Secondary | ICD-10-CM

## 2023-09-27 LAB — CBC WITH DIFFERENTIAL/PLATELET
Basophils Absolute: 0 10*3/uL (ref 0.0–0.1)
Basophils Relative: 0.5 % (ref 0.0–3.0)
Eosinophils Absolute: 0.1 10*3/uL (ref 0.0–0.7)
Eosinophils Relative: 1.2 % (ref 0.0–5.0)
HCT: 40.3 % (ref 36.0–46.0)
Hemoglobin: 13.3 g/dL (ref 12.0–15.0)
Lymphocytes Relative: 19.8 % (ref 12.0–46.0)
Lymphs Abs: 1.4 10*3/uL (ref 0.7–4.0)
MCHC: 33.1 g/dL (ref 30.0–36.0)
MCV: 93 fL (ref 78.0–100.0)
Monocytes Absolute: 0.7 10*3/uL (ref 0.1–1.0)
Monocytes Relative: 10.2 % (ref 3.0–12.0)
Neutro Abs: 4.9 10*3/uL (ref 1.4–7.7)
Neutrophils Relative %: 68.3 % (ref 43.0–77.0)
Platelets: 268 10*3/uL (ref 150.0–400.0)
RBC: 4.33 Mil/uL (ref 3.87–5.11)
RDW: 14.1 % (ref 11.5–15.5)
WBC: 7.2 10*3/uL (ref 4.0–10.5)

## 2023-09-27 LAB — BRAIN NATRIURETIC PEPTIDE: Pro B Natriuretic peptide (BNP): 91 pg/mL (ref 0.0–100.0)

## 2023-09-27 LAB — BASIC METABOLIC PANEL
BUN: 30 mg/dL — ABNORMAL HIGH (ref 6–23)
CO2: 31 meq/L (ref 19–32)
Calcium: 9.3 mg/dL (ref 8.4–10.5)
Chloride: 96 meq/L (ref 96–112)
Creatinine, Ser: 1.24 mg/dL — ABNORMAL HIGH (ref 0.40–1.20)
GFR: 41.53 mL/min — ABNORMAL LOW (ref >60.00)
Glucose, Bld: 150 mg/dL — ABNORMAL HIGH (ref 70–99)
Potassium: 4.6 meq/L (ref 3.5–5.1)
Sodium: 135 meq/L (ref 135–145)

## 2023-09-27 LAB — D-DIMER, QUANTITATIVE: D-Dimer, Quant: 0.62 ug{FEU}/mL — ABNORMAL HIGH (ref <0.50)

## 2023-09-27 LAB — TSH: TSH: 1.97 u[IU]/mL (ref 0.35–5.50)

## 2023-09-27 LAB — SEDIMENTATION RATE: Sed Rate: 27 mm/h (ref 0–30)

## 2023-09-27 NOTE — Patient Instructions (Addendum)
Please discuss your lack of improvement on the cardizem on your palpitations worse with exertion and your freqently assoc chest discomfort and light headedness  (always happens with exertion no other times).  Please remember to go to the  x-ray department  for your tests - we will call you with the results when they are available    Please remember to go to the lab department   for your tests - we will call you with the results when they are available.      Make sure you check your oxygen saturation at your highest level of activity(NOT after you stop)  to be sure it stays over 90% and keep track of it at least once a week, more often if breathing getting worse, and let me know if losing ground. (Collect the dots to connect the dots approach)    Pulmonary follow up is as needed

## 2023-09-27 NOTE — Progress Notes (Unsigned)
Pamela [name], female    DOB: 1945/11/02   MRN: [id number]   Brief patient profile:  45 yowf never smoker eval prior to Epic for lung nodules eval for CP at [facility] on 12/21/2018  = non-pleuritic  cp assoc with hbp > referred back to Pamela [name] with an incidental finding of a LLL nodule on CT so referred to pulmonary clinic 02/05/2019 by Pamela   [place] [name]      History of Present Illness  02/05/2019  Pulmonary/ 1st office eval/Pamela [name]  Chief Complaint  Patient presents with   Consult    consult for lung nodule and doe  Dyspnea:  Indolent  Onset x 6 m  prior to OV  = gives out walking at grocery store at [employer]  with assoc gen chest discomfort "every time" feels better at rest w/in15 min - says Pamela [name] knows all about and never occurs at rest but also has developed a different type of chest discomfort "like food getting stuck" x one min  when eats since off zantac and f/u Pamela [name] also scheduled Cough: denies  Sleep: fine flat  SABA use: none unless during bad uri and hasn't needed in "years"  Ex cp x 6 months off zantac  Rec Pantoprazole (protonix) 40 mg   Take  30-60 min before first meal of the day and Pepcid (famotidine)  20 mg one after supper until return to office  GERD diet  I will send my thoughts to Pamela [name] and your PCP your shortness of breath and chest discomfort     09/27/2023  re-establish  ov/Pamela [name] re: ? PF? Safe for amiodarone?    maint on ppi q d 30 min ac  no h2 hs  Chief Complaint  Patient presents with   Consult    Pt states she here to review result for the testing she had done.  Dyspnea:  using scooter at grocery  Cough: dry daytime s assoc rhinitis/ sinusitis cc Sleeping: hob 30 degrees no  resp cc  SABA use: none  02: none  Cp midline, only with ex, no dysphagia or odynophagia, rad  post to spine s lateralizing and can last up to 15 min p exertion, never at rest, assoc with ex palpitations and sob  Cp is not pleuritic    Last doses of Macrodantin ?  04/2023   No obvious day to day or daytime variability or assoc excess/ purulent sputum or mucus plugs or hemoptysis or subjective wheeze or overt sinus or hb symptoms.    Also denies any obvious fluctuation of symptoms with weather or environmental changes or other aggravating or alleviating factors except as outlined above   No unusual exposure hx or h/o childhood pna/ asthma or knowledge of premature birth.  Current Allergies, Complete Past Medical History, Past Surgical History, Family History, and Social History were reviewed in [employer] record.  ROS  The following are not active complaints unless bolded Hoarseness, sore throat, dysphagia, dental problems, itching, sneezing,  nasal congestion or discharge of excess mucus or purulent secretions, ear ache,   fever, chills, sweats, unintended wt loss or wt gain, classically pleuritic  cp,  orthopnea pnd or arm/hand swelling  or leg swelling, presyncope, palpitations, abdominal pain, anorexia, nausea, vomiting, diarrhea  or change in bowel habits or change in bladder habits, change in stools or change in urine, dysuria, hematuria,  rash, arthralgias, visual complaints, headache, numbness, weakness or ataxia or problems with walking or coordination,  change in mood or  memory.        Current Meds  Medication Sig   acetaminophen (TYLENOL) 650 MG CR tablet Take 650-1,300 mg by mouth every 8 (eight) hours as needed for pain.   albuterol (VENTOLIN HFA) 108 (90 Base) MCG/ACT inhaler INHALE 1 TO 2 PUFFS INTO THE LUNGS EVERY 6 HOURS AS NEEDED FOR WHEEZING OR SHORTNESS OF BREATH   apixaban (ELIQUIS) 5 MG TABS tablet Take 1 tablet (5 mg total) by mouth 2 (two) times daily.   aspirin EC (ASPIRIN LOW DOSE) 81 MG tablet TAKE 1 TABLET(81 MG) BY MOUTH DAILY   atorvastatin (LIPITOR) 80 MG tablet TAKE 1 TABLET(80 MG) BY MOUTH DAILY   carboxymethylcellulose (REFRESH PLUS) 0.5 % SOLN Place 1 drop into both eyes 3 (three) times daily as  needed (dry eyes).   Cholecalciferol (VITAMIN D3) 50 MCG (2000 UT) TABS Take 4,000 Units by mouth daily with lunch.   Continuous Glucose Sensor (FREESTYLE LIBRE 2 SENSOR) MISC 2 each by Does not apply route every 14 (fourteen) days. E11.40   cyanocobalamin (VITAMIN B12) 1000 MCG tablet Take 1,000 mcg by mouth daily.   dapagliflozin propanediol (FARXIGA) 10 MG TABS tablet Take 1 tablet (10 mg total) by mouth daily before breakfast.   diltiazem (CARDIZEM CD) 180 MG 24 hr capsule Take 1 capsule (180 mg total) by mouth daily.   Dulaglutide (TRULICITY) 0.75 MG/0.5ML SOPN Inject 0.75 mg into the skin once a week.   estradiol (ESTRACE) 0.1 MG/GM vaginal cream Place 1 Applicatorful vaginally 2 (two) times a week. As needed   ferrous sulfate 325 (65 FE) MG tablet Take 325 mg by mouth daily with lunch.   furosemide (LASIX) 40 MG tablet TAKE 1 TABLET(40 MG) BY MOUTH DAILY   GEMTESA 75 MG TABS Take 75 mg by mouth at bedtime.   insulin aspart (NOVOLOG FLEXPEN) 100 UNIT/ML FlexPen Max daily 30 units   insulin glargine, 1 Unit Dial, (TOUJEO) 300 UNIT/ML Solostar Pen Inject 6 Units into the skin at bedtime.   Insulin Pen Needle (BD PEN NEEDLE NANO 2ND GEN) 32G X 4 MM MISC USE TO INJECT INSULIN four times DAILY   isosorbide mononitrate (IMDUR) 30 MG 24 hr tablet TAKE 1 TABLET BY MOUTH DAILY. PLEASE ARRANGE APPT FOR FURTHER REFILLS   lisinopril (ZESTRIL) 20 MG tablet TAKE 1 TABLETS BY MOUTH TWICE DAILY   magnesium oxide (MAG-OX) 400 (240 Mg) MG tablet TAKE 2 TABLETS(800 MG) BY MOUTH TWICE DAILY (Patient taking differently: Take 800 mg by mouth 2 (two) times daily.)   Multiple Vitamins-Minerals (PRESERVISION AREDS PO) Take 1 capsule by mouth 2 (two) times daily.   nystatin cream (MYCOSTATIN) Apply 1 Application topically 4 (four) times daily as needed for dry skin (yeast infection).   ondansetron (ZOFRAN-ODT) 8 MG disintegrating tablet Take 8 mg by mouth every 8 (eight) hours as needed for nausea or vomiting.     pantoprazole (PROTONIX) 40 MG tablet TAKE 1 TABLET(40 MG) BY MOUTH EVERY MORNING   polyethylene glycol powder (MIRALAX) 17 GM/SCOOP powder Take 17 g by mouth daily as needed for moderate constipation.   pyridoxine (B-6) 100 MG tablet Take 100 mg by mouth daily.   sennosides-docusate sodium (SENOKOT-S) 8.6-50 MG tablet Take 1 tablet by mouth daily.   sodium chloride (OCEAN) 0.65 % SOLN nasal spray Place 1 spray into both nostrils as needed for congestion.   zolpidem (AMBIEN) 5 MG tablet Take 1 tablet (5 mg total) by mouth at bedtime as needed for sleep.  Objective:       Wt Readings from Last 3 Encounters:  09/27/23 146 lb 9.6 oz (66.5 kg)  08/07/23 143 lb (64.9 kg)  07/17/23 148 lb (67.1 kg)      Vital signs reviewed  09/27/2023  - Note at rest 02 sats  97% on RA   General appearance:    somber amb wf somewhat of a "belle indifference"  affect when describing her symptoms   HEENT : Oropharynx  clear      Nasal turbinates nl    NECK :  without  apparent JVD/ palpable Nodes/TM    LUNGS: no acc muscle use,  Nl contour chest which is clear to A and P bilaterally without cough on insp or exp maneuvers   CV:  RRR  no s3 or murmur or increase in P2, and no edema   ABD:  soft and nontender   MS:  Gait a bit unsteady. Walks with cane-   ext warm without deformities Or obvious joint restrictions  calf tenderness, cyanosis or clubbing    SKIN: warm and dry without lesions    NEURO:  alert, approp, nl sensorium with  no motor or cerebellar deficits apparent.    Labs ordered/ reviewed:      Chemistry      Component Value Date/Time   NA 135 09/27/2023 1421   NA 135 07/17/2023 1431   K 4.6 09/27/2023 1421   CL 96 09/27/2023 1421   CO2 31 09/27/2023 1421   BUN 30 (H) 09/27/2023 1421   BUN 25 07/17/2023 1431   CREATININE 1.24 (H) 09/27/2023 1421   CREATININE 1.05 (H) 06/06/2023 0940   GLU 183 02/03/2022 0000      Component Value Date/Time   CALCIUM 9.3 09/27/2023  1421   ALKPHOS 150 (H) 07/17/2023 1431   AST 33 07/17/2023 1431   AST 30 06/06/2023 0940   ALT 33 (H) 07/17/2023 1431   ALT 32 06/06/2023 0940   BILITOT 0.4 07/17/2023 1431   BILITOT 0.4 06/06/2023 0940        Lab Results  Component Value Date   WBC 7.2 09/27/2023   HGB 13.3 09/27/2023   HCT 40.3 09/27/2023   MCV 93.0 09/27/2023   PLT 268.0 09/27/2023     Lab Results  Component Value Date   DDIMER 0.62 (H) 09/27/2023      Lab Results  Component Value Date   TSH 1.97 09/27/2023     Lab Results  Component Value Date   PROBNP 91.0 09/27/2023       Lab Results  Component Value Date   ESRSEDRATE 27 09/27/2023   ESRSEDRATE 25 10/18/2021           Assessment

## 2023-09-28 ENCOUNTER — Ambulatory Visit (HOSPITAL_BASED_OUTPATIENT_CLINIC_OR_DEPARTMENT_OTHER)
Admission: RE | Admit: 2023-09-28 | Discharge: 2023-09-28 | Disposition: A | Discharge status: Home / Self Care | Payer: Medicare PPO | Source: Ambulatory Visit | Attending: Internal Medicine | Admitting: Internal Medicine

## 2023-09-28 ENCOUNTER — Telehealth: Payer: Self-pay | Admitting: *Deleted

## 2023-09-28 DIAGNOSIS — N189 Chronic kidney disease, unspecified: Secondary | ICD-10-CM | POA: Diagnosis not present

## 2023-09-28 DIAGNOSIS — N183 Chronic kidney disease, stage 3 unspecified: Secondary | ICD-10-CM

## 2023-09-28 NOTE — Telephone Encounter (Signed)
Good Afternoon Dr. Servando Salina  We have received a surgical clearance request for [name] for an epidural ESI procedure. They were seen recently in clinic on 08/07/2023.  She has a PMH of paroxysmal AF, DVT, HTN, HLD, DM, protein S deficiency.  Can you please comment on surgical clearance for upcoming neuro procedure. Please forward you guidance and recommendations to P [facility] PREOP   Thanks, Robin Searing, NP

## 2023-09-28 NOTE — Telephone Encounter (Signed)
   Pre-operative Risk Assessment    Patient Name: Pamela [name]  DOB: [birth year] MRN: [id number]      Request for Surgical Clearance    Procedure:   Lumbar Spine Translam - L4-L5 Right, Sedation  Date of Surgery:  Clearance TBD                                 Surgeon:  Barnett Abu, MD Surgeon's Group or Practice Name:  [facility] Phone number:  [phone]. [phone] Fax number:  [phone]   Type of Clearance Requested:   - Medical    Type of Anesthesia:   Sedation   Additional requests/questions:   Fax should be attention to [employer]   09/28/2023, 1:02 PM

## 2023-09-28 NOTE — Assessment & Plan Note (Addendum)
Onset early 2020   - 02/05/2019   Walked RA  2 laps @  approx 243ft each @ nl pace  stopped due to  Light headed, no  cp or sob, no desats   - worse symptoms since 2023 assoc lightheaded/dizzy  and midline ant cp that radiates to spine s pleuritic or lateralizig features but had not discussed with her cardiologist as of 09/27/2023  - Last doses of Macrodantin ? 04/2023  - CT chest cuts on coronary study 05/16/23  - 09/27/2023   Walked on RA  x  2   lap(s) =  approx 500  ft  @ slow to mod pace   with a cane. Patient able to complete 2 laps. Patient c/o dizziness and loss of balance 3 times during walk. Patient stated she had some chest discomfort during the walk. Patient did not feel she could walk lap 3.    Her cardiac w/u has been negative for CAD to date but it certainly is unusual than the midline pain apparently has not been disclosded to her cardiologist p 4 years of consistent exertional pattern always better at rest so if it is angina it is quite stable  Her labs are fine including baseline ESR of 27 and no desats walking   The issue for pulmonary was ? If PF bad enough to preclude the use of amiodarone.  If this is related to macrodantin then all we have to do is avoid it and monitor carefully on amiodarone @ lowest dose possible  Patients typically have been on amiodarone for 6-12 months before this complication manifests.  Of note, serial clinical evaluation for symptoms such as cough dyspnea or fevers is  the preferred method of monitoring for pulmonary toxicity because a decrease in DLCO or lung volumes is a nonspecific for toxicity. Pathologically amiodarone pulmonary toxicity may appear as interstitial pneumonitis, eosinophilic pneumonia, organizing pneumonia, pulmonary fibrosis or less commonly as diffuse alveolar hemorrhage, pulmonary nodules or pleural effusions.  Risk factors for pulmonary toxicity include age greater than 60, daily dose greater than equal to 400 mg, a high cumulative  dose, or pre-existing lung disease  so she has 2/4  Rec Baseline walking sats  done today and should be repeated by the pt weekly  Baseline labs to include  PFTs  If amiodarone can't be avoided then at least we have a baseline to compare to if starts losing groiund  Pulmonary f/u can be prn    Each maintenance medication was reviewed in detail including emphasizing most importantly the difference between maintenance and prns and under what circumstances the prns are to be triggered using an action plan format where appropriate.  Total time for H and P, chart review, counseling,  directly observing portions of ambulatory 02 saturation study/ and generating customized AVS unique to this office visit / same day charting > 60 min with pt not seen in > 3 y

## 2023-10-02 ENCOUNTER — Telehealth: Payer: Self-pay

## 2023-10-02 NOTE — Telephone Encounter (Signed)
Pt reports she didn't receive Eliquis until middle of last week. She didn't want to start then, so she continued the coumadin. Her concern is that the Eliquis has potential side effect of causing spinal bleeding. She had spinal surgery in May 2024.

## 2023-10-05 ENCOUNTER — Encounter: Payer: Self-pay | Admitting: Internal Medicine

## 2023-10-05 ENCOUNTER — Ambulatory Visit: Payer: Medicare PPO | Admitting: Internal Medicine

## 2023-10-05 ENCOUNTER — Other Ambulatory Visit (HOSPITAL_COMMUNITY): Payer: Self-pay

## 2023-10-05 ENCOUNTER — Telehealth: Payer: Self-pay

## 2023-10-05 VITALS — BP 122/80 | HR 60 | Ht 60.0 in | Wt 147.0 lb

## 2023-10-05 DIAGNOSIS — E1140 Type 2 diabetes mellitus with diabetic neuropathy, unspecified: Secondary | ICD-10-CM | POA: Diagnosis not present

## 2023-10-05 DIAGNOSIS — Z794 Long term (current) use of insulin: Secondary | ICD-10-CM

## 2023-10-05 DIAGNOSIS — E1122 Type 2 diabetes mellitus with diabetic chronic kidney disease: Secondary | ICD-10-CM

## 2023-10-05 DIAGNOSIS — N1832 Chronic kidney disease, stage 3b: Secondary | ICD-10-CM

## 2023-10-05 LAB — POCT GLYCOSYLATED HEMOGLOBIN (HGB A1C): Hemoglobin A1C: 6.7 % — AB (ref 4.0–5.6)

## 2023-10-05 MED ORDER — FREESTYLE LIBRE 2 SENSOR MISC: 2.0000 | 3 refills | Status: DC

## 2023-10-05 MED ORDER — NOVOLOG FLEXPEN 100 UNIT/ML ~~LOC~~ SOPN: PEN_INJECTOR | SUBCUTANEOUS | 3 refills | Status: DC

## 2023-10-05 MED ORDER — DAPAGLIFLOZIN PROPANEDIOL 10 MG PO TABS: 10.0000 mg | ORAL_TABLET | Freq: Every day | ORAL | 3 refills | Status: DC

## 2023-10-05 MED ORDER — BD PEN NEEDLE NANO 2ND GEN 32G X 4 MM MISC: 1.0000 | Freq: Four times a day (QID) | 3 refills | Status: DC

## 2023-10-05 MED ORDER — TRULICITY 0.75 MG/0.5ML ~~LOC~~ SOAJ: 0.7500 mg | SUBCUTANEOUS | 3 refills | Status: DC

## 2023-10-05 NOTE — Telephone Encounter (Signed)
Pharmacy Patient Advocate Encounter   Received notification from Pt Calls Messages that prior authorization for Trulicity is required/requested.   Insurance verification completed.   The patient is insured through [employer] .   Per test claim: The current 30 day co-pay is, $40.  No PA needed at this time. This test claim was processed through [facility]- copay amounts may vary at other pharmacies due to pharmacy/plan contracts, or as the patient moves through the different stages of their insurance plan.

## 2023-10-05 NOTE — Telephone Encounter (Signed)
Trulicity needs PA

## 2023-10-05 NOTE — Patient Instructions (Addendum)
STOP Toujeo  Continue Trulicity 0.75 mg ONCE weekly  Continue  farxiga 10 mg , 1 tablet every morning  Take  Novolog 4 units with Breakfast, 4 units with Lunch and 4 units with Supper  Novolog correctional insulin: ADD extra units on insulin to your meal-time Novolog dose if your blood sugars are higher than 185. Use the scale below to help guide you:   Blood sugar before meal Number of units to inject  Less than 185 0 unit  186 - 240 1 units  241 - 295 2 units  296 - 350 3 units  351 - 405 4 units  406 - 460 5 units      HOW TO TREAT LOW BLOOD SUGARS (Blood sugar LESS THAN 70 MG/DL) Please follow the RULE OF 15 for the treatment of hypoglycemia treatment (when your (blood sugars are less than 70 mg/dL)   STEP 1: Take 15 grams of carbohydrates when your blood sugar is low, which includes:  3-4 GLUCOSE TABS  OR 3-4 OZ OF JUICE OR REGULAR SODA OR ONE TUBE OF GLUCOSE GEL    STEP 2: RECHECK blood sugar in 15 MINUTES STEP 3: If your blood sugar is still low at the 15 minute recheck --> then, go back to STEP 1 and treat AGAIN with another 15 grams of carbohydrates.

## 2023-10-05 NOTE — Progress Notes (Signed)
Name: Pamela [name]  MRN/ DOB: [id number], 02-25-1946   Age/ Sex: 78 y.o., female    PCP: Blane Ohara, MD   Reason for Endocrinology Evaluation: Type 2 Diabetes Mellitus     Date of Initial Endocrinology Visit: 03/30/2022    PATIENT IDENTIFIER: [name] is a 78 y.o. female with a past medical history of T2DM, GERD, A. Fib . The patient presented for initial endocrinology clinic visit on 10/05/2023 for consultative assistance with her diabetes management.    HPI: Pamela [name] was    Diagnosed with DM in 1985 Prior Medications tried/Intolerance: Metformin - tired .Ozempic - diarrhea , Rybelsus caused nausea  Hemoglobin A1c has ranged from 7.2% in 2023, peaking at 9.8% in 2022.   Of note, the pt was seen by Dr. Everardo All in 2021 for hyponatremia . Adrenal insufficiency was excluded    She was started on Farxiga in July 2023 Started Trulicity 05/2023  SUBJECTIVE:   During the last visit (06/05/2023): A1c 7.5%    Today (10/05/23): [name] is here for a follow up on diabetes management. She checks her blood sugars multiple times daily through CGM. The patient has had hypoglycemic episodes since the last clinic visit, she is symptomatic   Patient continues to follow-up with oncology for protein S deficiency, on Coumadin Patient follows with Dr. Ronalee Belts (nephrology) Patient had a follow-up with cardiology CAD Has occasional nausea related to smelling food well cooking with rare vomiting episode Has alternating bowel movements  She does have GERD and    HOME DIABETES REGIMEN: Farxiga 10 mg daily Trulicity 0.75 mg weekly Toujeo 6 units daily  Novolog 4/4/5 units Correction factor: NovoLog (BG -120/55)     Statin: yes ACE-I/ARB: yes    CONTINUOUS GLUCOSE MONITORING RECORD INTERPRETATION    Dates of Recording: 1/17-1/30/2025  Sensor description: Freestyle libre  Results statistics:   CGM use % of time 86  Average and SD  154/30.5  Time in range 74 %  % Time Above 180 22  % Time above 250 4  % Time Below target 0   Glycemic patterns summary: BGs are optimal overnight and trend up during the day  Hyperglycemic episodes post prandial  Hypoglycemic episodes occurred after bolus  Overnight periods: stable     DIABETIC COMPLICATIONS: Microvascular complications:  Macular degeneration , CKD III Denies:  Last eye exam: Completed 09/2021  Macrovascular complications:   Denies: CAD, PVD, CVA   PAST HISTORY: Past Medical History:  Past Medical History:  Diagnosis Date   Anticoagulated on Coumadin    chronic--- managed by hematology/ oncology,   Asthma, mild intermittent    followed by pcp--- per pt last exacerbation winter 2021 w/ acute bronchitis   Bilateral leg cramps    Blood dyscrasia    Chronic constipation    CKD (chronic kidney disease), stage III (HCC)    Closed bimalleolar fracture of left ankle 02/26/2020   DDD (degenerative disc disease), cervical    w/ spondylosis,  per pt last steroid injection 06/ 2022   DDD (degenerative disc disease), lumbosacral    DVT (deep venous thrombosis) (HCC) 07/30/2013   Dyspnea    occasionally   Dysrhythmia    GERD (gastroesophageal reflux disease)    History of cardiac murmur as a child    History of DVT of lower extremity    left lower extremity in 1980s, fell when bowling   History of pulmonary embolus (PE) 1993   per pt left lung post  op 2 wks cholecystectomy   History of rheumatic fever as a child    per last echo 12-31-2019 no valvular issues   History of TIA (transient ischemic attack)    2014 and 2018 or 2019,  per pt no residual   HTN (hypertension)    followed by pcp   Hyperlipidemia    Hypothyroidism    IDA (iron deficiency anemia)    Macular degeneration of both eyes    Mild obstructive sleep apnea    per pt dx 2017 tried to uses cpap but intolerant   Mixed incontinence urge and stress    urologist--- dr Saddie Benders   OA  (osteoarthritis) 07/06/2018   Osteoporosis    PAF (paroxysmal atrial fibrillation) (HCC) 10/01/2014   cardiologist--- dr Lavona Mound tobb;   cardiac cath 02-18-2013 normal coronaries arteries, ef 50%, cath done since echo showed ef 30-35%; nucleat stress study 04/ 2020 normal , normal echo 04/ 2021,  event monitor 09-14-2020 rare ST/AT variable block   Pneumonia    PONV (postoperative nausea and vomiting)    Protein S deficiency (HCC)    followed by hemotology/ oncology-- dr d. Melvyn Neth ([facility]) dx 1980s;  prior DVT left lower leg 1980s and left lung PE 1993; chronic  coumadin since 1980s   PVC's (premature ventricular contractions)    followed by cardiology   S/P cardiac catheterization 02/2013   Normal coronaries; low normal EF at 50%   Solitary pulmonary nodule on lung CT 02/06/2019   First noted 01/13/2014 > no change as of 12/21/2018   Spondylolisthesis, lumbar region 08/09/2018   Stroke (HCC)    TIA in 2018 or 2019   Transient ischemic attack 07/30/2013   Type 2 diabetes mellitus treated with insulin (HCC)    endocrilogist--- whitney reardon NP     (03-10-2021  pt continuously checks blood sugar throughout the day w/ Libre, fasting sugar --- 69--200)   Wears glasses    Wears hearing aid in both ears    Past Surgical History:  Past Surgical History:  Procedure Laterality Date   ABDOMINAL HYSTERECTOMY     BLEPHAROPLASTY     CARDIAC CATHETERIZATION  02/18/2013   @ [facility]  by Dr [place];  normal coronaries w/ preserved LVF, ef 50%;   previous cath 2001 normal ef 65%   CARPAL TUNNEL RELEASE Bilateral 1994   CATARACT EXTRACTION W/ INTRAOCULAR LENS IMPLANT Bilateral 2017   CHOLECYSTECTOMY     CHOLECYSTECTOMY, LAPAROSCOPIC  1993   COLONOSCOPY     EAR BIOPSY Left    FINGER SURGERY Left 2018   thumb   FOOT SURGERY     FOOT TENDON SURGERY Right    early 2000s   FRACTURE SURGERY Left 02/13/2020   left femur   HAND SURGERY Right 04/2022   HARDWARE REMOVAL Left 03/12/2021    Procedure: HARDWARE REMOVAL;  Surgeon: Yolonda Kida, MD;  Location: [facility];  Service: Orthopedics;  Laterality: Left;  60 MINS   JOINT REPLACEMENT     LUMBAR LAMINECTOMY/DECOMPRESSION MICRODISCECTOMY N/A 12/22/2022   Procedure: Lumbar Two-Lumbar Three, Lumbar Three-Lumbar Four Lumbar Decompression;  Surgeon: Barnett Abu, MD;  Location: [facility] OR;  Service: Neurosurgery;  Laterality: N/A;  RM 20 3C   ORIF ANKLE FRACTURE Left 02/26/2020   Procedure: OPEN REDUCTION INTERNAL FIXATION (ORIF) ANKLE FRACTURE;  Surgeon: Yolonda Kida, MD;  Location: [facility] OR;  Service: Orthopedics;  Laterality: Left;  90 mins   TONSILLECTOMY AND ADENOIDECTOMY Bilateral    TOTAL KNEE  ARTHROPLASTY Left 03/2005   TOTAL VAGINAL HYSTERECTOMY  1988   per pt still has ovaries    Social History:  reports that she has never smoked. She has never used smokeless tobacco. She reports that she does not drink alcohol and does not use drugs. Family History:  Family History  Problem Relation Age of Onset   Cirrhosis Mother    Antithrombin III deficiency Mother        multiple emboli   Ulcerative colitis Mother    Diabetes Father    Coronary artery disease Father    Heart attack Father    Hypertension Father    Kidney disease Father    Hypertension Sister    Diabetes Sister    Heart attack Sister        age 28    Protein S deficiency Daughter    Heart attack Brother    Hypertension Brother    Lung cancer Brother    Stroke Neg Hx    Colitis Neg Hx    Colon polyps Neg Hx    Esophageal cancer Neg Hx    Liver cancer Neg Hx    Pancreatic cancer Neg Hx    Rectal cancer Neg Hx    Stomach cancer Neg Hx    Breast cancer Neg Hx      HOME MEDICATIONS: Allergies as of 10/05/2023       Reactions   Ketek [telithromycin] Nausea And Vomiting, Rash   Loxapine Succinate Hives   Macrodantin [nitrofurantoin Macrocrystal] Other (See Comments)   Pulmonary fibrosis   Naproxen Sodium Anaphylaxis,  Hives   Amoxapine And Related Hives   Darvon Nausea And Vomiting   Belsomra [suvorexant] Other (See Comments)   "Nightmares"   Cymbalta [duloxetine Hcl] Other (See Comments)   "Blackouts"   Duloxetine    Gabapentin Other (See Comments)   Blurry vision   Lyrica [pregabalin]    Makes her sedated   Ozempic (0.25 Or 0.5 Mg-dose) [semaglutide(0.25 Or 0.5mg -dos)] Diarrhea   Semaglutide Diarrhea   Rybelsus   Amoxicillin Rash   Propoxyphene Nausea And Vomiting        Medication List        Accurate as of October 05, 2023  9:35 AM. If you have any questions, ask your nurse or doctor.          acetaminophen 650 MG CR tablet Commonly known as: TYLENOL Take 650-1,300 mg by mouth every 8 (eight) hours as needed for pain.   albuterol 108 (90 Base) MCG/ACT inhaler Commonly known as: VENTOLIN HFA INHALE 1 TO 2 PUFFS INTO THE LUNGS EVERY 6 HOURS AS NEEDED FOR WHEEZING OR SHORTNESS OF BREATH   amoxicillin-clavulanate 875-125 MG tablet Commonly known as: AUGMENTIN Take 1 tablet by mouth 2 (two) times daily.   apixaban 5 MG Tabs tablet Commonly known as: ELIQUIS Take 1 tablet (5 mg total) by mouth 2 (two) times daily.   Aspirin Low Dose 81 MG tablet Generic drug: aspirin EC TAKE 1 TABLET(81 MG) BY MOUTH DAILY   atorvastatin 80 MG tablet Commonly known as: LIPITOR TAKE 1 TABLET(80 MG) BY MOUTH DAILY   BD Pen Needle Nano 2nd Gen 32G X 4 MM Misc Generic drug: Insulin Pen Needle USE TO INJECT INSULIN four times DAILY   carboxymethylcellulose 0.5 % Soln Commonly known as: REFRESH PLUS Place 1 drop into both eyes 3 (three) times daily as needed (dry eyes).   cyanocobalamin 1000 MCG tablet Commonly known as: VITAMIN B12 Take 1,000 mcg by mouth  daily.   dapagliflozin propanediol 10 MG Tabs tablet Commonly known as: Farxiga Take 1 tablet (10 mg total) by mouth daily before breakfast.   diltiazem 180 MG 24 hr capsule Commonly known as: CARDIZEM CD Take 1 capsule (180 mg  total) by mouth daily.   estradiol 0.1 MG/GM vaginal cream Commonly known as: ESTRACE Place 1 Applicatorful vaginally 2 (two) times a week. As needed   ferrous sulfate 325 (65 FE) MG tablet Take 325 mg by mouth daily with lunch.   FreeStyle Libre 2 Sensor Misc 2 each by Does not apply route every 14 (fourteen) days. E11.40   furosemide 40 MG tablet Commonly known as: LASIX TAKE 1 TABLET(40 MG) BY MOUTH DAILY   Gemtesa 75 MG Tabs Generic drug: Vibegron Take 75 mg by mouth at bedtime.   hydrochlorothiazide 12.5 MG tablet Commonly known as: HYDRODIURIL Take 12.5 mg by mouth daily.   insulin glargine (1 Unit Dial) 300 UNIT/ML Solostar Pen Commonly known as: TOUJEO Inject 6 Units into the skin at bedtime.   isosorbide mononitrate 30 MG 24 hr tablet Commonly known as: IMDUR TAKE 1 TABLET BY MOUTH DAILY. PLEASE ARRANGE APPT FOR FURTHER REFILLS   lisinopril 20 MG tablet Commonly known as: ZESTRIL TAKE 1 TABLETS BY MOUTH TWICE DAILY   magnesium oxide 400 (240 Mg) MG tablet Commonly known as: MAG-OX TAKE 2 TABLETS(800 MG) BY MOUTH TWICE DAILY What changed: See the new instructions.   MiraLax 17 GM/SCOOP powder Generic drug: polyethylene glycol powder Take 17 g by mouth daily as needed for moderate constipation.   NovoLOG FlexPen 100 UNIT/ML FlexPen Generic drug: insulin aspart Max daily 30 units   nystatin cream Commonly known as: MYCOSTATIN Apply 1 Application topically 4 (four) times daily as needed for dry skin (yeast infection).   ondansetron 8 MG disintegrating tablet Commonly known as: ZOFRAN-ODT Take 8 mg by mouth every 8 (eight) hours as needed for nausea or vomiting.   pantoprazole 40 MG tablet Commonly known as: PROTONIX TAKE 1 TABLET(40 MG) BY MOUTH EVERY MORNING   PRESERVISION AREDS PO Take 1 capsule by mouth 2 (two) times daily.   pyridoxine 100 MG tablet Commonly known as: B-6 Take 100 mg by mouth daily.   sennosides-docusate sodium 8.6-50 MG  tablet Commonly known as: SENOKOT-S Take 1 tablet by mouth daily.   sodium chloride 0.65 % Soln nasal spray Commonly known as: OCEAN Place 1 spray into both nostrils as needed for congestion.   Trulicity 0.75 MG/0.5ML Soaj Generic drug: Dulaglutide Inject 0.75 mg into the skin once a week.   Vitamin D3 50 MCG (2000 UT) Tabs Take 4,000 Units by mouth daily with lunch.   zolpidem 5 MG tablet Commonly known as: AMBIEN Take 1 tablet (5 mg total) by mouth at bedtime as needed for sleep.         ALLERGIES: Allergies  Allergen Reactions   Ketek [Telithromycin] Nausea And Vomiting and Rash   Loxapine Succinate Hives   Macrodantin [Nitrofurantoin Macrocrystal] Other (See Comments)    Pulmonary fibrosis   Naproxen Sodium Anaphylaxis and Hives   Amoxapine And Related Hives   Darvon Nausea And Vomiting   Belsomra [Suvorexant] Other (See Comments)    "Nightmares"   Cymbalta [Duloxetine Hcl] Other (See Comments)    "Blackouts"   Duloxetine    Gabapentin Other (See Comments)    Blurry vision   Lyrica [Pregabalin]     Makes her sedated   Ozempic (0.25 Or 0.5 Mg-Dose) [Semaglutide(0.25 Or 0.5mg -Dos)] Diarrhea  Semaglutide Diarrhea    Rybelsus   Amoxicillin Rash   Propoxyphene Nausea And Vomiting     REVIEW OF SYSTEMS: A comprehensive ROS was conducted with the patient and is negative except as per HPI     OBJECTIVE:   VITAL SIGNS: BP 122/80 (BP Location: Left Arm, Patient Position: Sitting, Cuff Size: Normal)   Pulse 60   Ht 5' (1.524 m)   Wt 147 lb (66.7 kg)   SpO2 97%   BMI 28.71 kg/m    PHYSICAL EXAM:  General: Pt appears well and is in NAD  Lungs: Clear with good BS bilat   Heart: RRR   Extremities:  Lower extremities - No pretibial edema.   Neuro: MS is good with appropriate affect, pt is alert and Ox3    DM Foot Exam 06/05/2023  The skin of the feet is intact without sores or ulcerations. The pedal pulses are 2+ on right and 2+ on left. The sensation  is decreased  to a screening 5.07, 10 gram monofilament bilaterally at the toes     DATA REVIEWED:  Lab Results  Component Value Date   HGBA1C 6.7 (A) 10/05/2023   HGBA1C 6.8 (H) 07/17/2023   HGBA1C 7.5 (H) 04/13/2023     Latest Reference Range & Units 09/27/23 14:21  Sodium 135 - 145 mEq/L 135  Potassium 3.5 - 5.1 mEq/L 4.6  Chloride 96 - 112 mEq/L 96  CO2 19 - 32 mEq/L 31  Glucose 70 - 99 mg/dL 161 (H)  BUN 6 - 23 mg/dL 30 (H)  Creatinine 0.96 - 1.20 mg/dL 0.45 (H)  Calcium 8.4 - 10.5 mg/dL 9.3  GFR >40.98 mL/min 41.53 (L)     Latest Reference Range & Units 09/27/23 14:21  TSH 0.35 - 5.50 uIU/mL 1.97     ASSESSMENT / PLAN / RECOMMENDATIONS:   1) Type 2 Diabetes Mellitus, Optimally controlled, With CKD III complications - Most recent A1c of 6.7%. Goal A1c < 7.5 %.    -A1c is optimal -Labs at outside facility from 07/2023 were reviewed -She has reported intolerance to Ozempic and Rybelsus -She has noted minor nausea with Trulicity, but did not recommend discontinuing Trulicity, we also discussed trying Mounjaro, but the patient opted to remain on Trulicity since her symptoms are minor and tolerable at this time -Patient endorses hypoglycemia overnight, will discontinue Toujeo -I will also adjust her NovoLog, she was encouraged to continue to take NovoLog before each meal and use correction scale as needed   MEDICATIONS: Continue Trulicity 0.75 mg weekly Continue Farxiga 10 mg daily STOP Toujeo 6 units daily Change NovoLog 4 units with breakfast, 4 units with lunch, and 4 units with supper Continue correction factor : NovoLog (BG -130/55)  EDUCATION / INSTRUCTIONS: BG monitoring instructions: Patient is instructed to check her blood sugars 3 times a day, before meals. Call [facility] if: BG persistently < 70  I reviewed the Rule of 15 for the treatment of hypoglycemia in detail with the patient. Literature supplied.   2) Diabetic complications:   Eye: Does not have known diabetic retinopathy.  Neuro/ Feet: Does not have known diabetic peripheral neuropathy. Renal: Patient does not have known baseline CKD. She is  on an ACEI/ARB at present.   Follow-up in 4 months     Signed electronically by: Lyndle Herrlich, MD  [facility] Endocrinology  [facility] [address] [place], [place] [address] Phone: [phone] FAX: [phone]   CC: Blane Ohara, MD  [address] [place] [address] Phone: [phone]  Fax: [phone]    Return to Endocrinology clinic as below: Future Appointments  Date Time Provider Department Center  10/05/2023  9:50 AM Dominico Rod, Konrad Dolores, MD [facility] None  10/24/2023  8:00 AM [facility] None  10/26/2023 10:20 AM Cox, Fritzi Mandes, MD [facility] None  12/01/2023 10:20 AM Tobb, Lavona Mound, DO [facility] None  04/03/2024 10:20 AM Cox, Fritzi Mandes, MD [facility] None  06/05/2024  9:30 AM [facility] None  06/05/2024 10:00 AM Weston Settle, MD [facility] None

## 2023-10-06 NOTE — Telephone Encounter (Signed)
Patient received letter stating the PA was needed

## 2023-10-06 NOTE — Telephone Encounter (Signed)
Dr. Servando Salina, patient was recently seen by you in clinic.  Please advise on risk for upcoming lumbar spine procedure.  Thank you for your help.  Please direct your response to [facility] preop pool.  Thomasene Ripple. Vielka Klinedinst NP-C     10/06/2023, 12:01 PM [facility] [address] Office [phone] Fax [phone]

## 2023-10-09 ENCOUNTER — Telehealth: Payer: Self-pay

## 2023-10-09 NOTE — Telephone Encounter (Signed)
Pt called to state, "I checked my INR just out of curiosity today. It was 1.1. I'm bumfuzzled about that. I started the Eliquis 5mg  BID last week". Please advise.

## 2023-10-10 ENCOUNTER — Telehealth: Payer: Self-pay | Admitting: *Deleted

## 2023-10-10 NOTE — Telephone Encounter (Signed)
   Name: Pamela [name]  DOB: August 20, 1946  MRN: [id number]  Primary Cardiologist: Kardie Tobb, DO   Preoperative team, please contact this patient and set up a phone call appointment for further preoperative risk assessment. Please obtain consent and complete medication review. Thank you for your help.  I confirm that guidance regarding antiplatelet and oral anticoagulation therapy has been completed and, if necessary, noted below.  Anticoagulation managed by PCP.  I also confirmed the patient resides in the state of [place] . As per [facility] Medical Board telemedicine laws, the patient must reside in the state in which the provider is licensed.   Josefa CHRISTELLA Beauvais, NP 10/10/2023, 11:25 AM [facility]

## 2023-10-10 NOTE — Telephone Encounter (Signed)
Pt has been scheduled tele preop appt 10/07/23. Med rec and consent are done.

## 2023-10-10 NOTE — Telephone Encounter (Signed)
 Pt has been scheduled tele preop appt 10/07/23. Med rec and consent are done.     Patient Consent for Virtual Visit        Saprina Meleana Commerford has provided verbal consent on 10/10/2023 for a virtual visit (video or telephone).   CONSENT FOR VIRTUAL VISIT FOR:  Pamela [name]  By participating in this virtual visit I agree to the following:  I hereby voluntarily request, consent and authorize [facility] and its employed or contracted physicians, physician assistants, nurse practitioners or other licensed health care professionals (the Practitioner), to provide me with telemedicine health care services (the "Services) as deemed necessary by the treating Practitioner. I acknowledge and consent to receive the Services by the Practitioner via telemedicine. I understand that the telemedicine visit will involve communicating with the Practitioner through live audiovisual communication technology and the disclosure of certain medical information by electronic transmission. I acknowledge that I have been given the opportunity to request an in-person assessment or other available alternative prior to the telemedicine visit and am voluntarily participating in the telemedicine visit.  I understand that I have the right to withhold or withdraw my consent to the use of telemedicine in the course of my care at any time, without affecting my right to future care or treatment, and that the Practitioner or I may terminate the telemedicine visit at any time. I understand that I have the right to inspect all information obtained and/or recorded in the course of the telemedicine visit and may receive copies of available information for a reasonable fee.  I understand that some of the potential risks of receiving the Services via telemedicine include:  Delay or interruption in medical evaluation due to technological equipment failure or disruption; Information transmitted may not be  sufficient (e.g. poor resolution of images) to allow for appropriate medical decision making by the Practitioner; and/or  In rare instances, security protocols could fail, causing a breach of personal health information.  Furthermore, I acknowledge that it is my responsibility to provide information about my medical history, conditions and care that is complete and accurate to the best of my ability. I acknowledge that Practitioner's advice, recommendations, and/or decision may be based on factors not within their control, such as incomplete or inaccurate data provided by me or distortions of diagnostic images or specimens that may result from electronic transmissions. I understand that the practice of medicine is not an exact science and that Practitioner makes no warranties or guarantees regarding treatment outcomes. I acknowledge that a copy of this consent can be made available to me via my patient portal [facility] MyChart), or I can request a printed copy by calling the office of [facility].    I understand that my insurance will be billed for this visit.   I have read or had this consent read to me. I understand the contents of this consent, which adequately explains the benefits and risks of the Services being provided via telemedicine.  I have been provided ample opportunity to ask questions regarding this consent and the Services and have had my questions answered to my satisfaction. I give my informed consent for the services to be provided through the use of telemedicine in my medical care

## 2023-10-12 NOTE — Telephone Encounter (Signed)
No action needed at this time. Closing encounter

## 2023-10-13 DIAGNOSIS — S93401A Sprain of unspecified ligament of right ankle, initial encounter: Secondary | ICD-10-CM

## 2023-10-13 DIAGNOSIS — M25571 Pain in right ankle and joints of right foot: Secondary | ICD-10-CM | POA: Diagnosis not present

## 2023-10-13 DIAGNOSIS — M79671 Pain in right foot: Secondary | ICD-10-CM | POA: Diagnosis not present

## 2023-10-13 HISTORY — DX: Sprain of unspecified ligament of right ankle, initial encounter: S93.401A

## 2023-10-14 ENCOUNTER — Other Ambulatory Visit: Payer: Self-pay | Admitting: Family Medicine

## 2023-10-16 ENCOUNTER — Other Ambulatory Visit (HOSPITAL_COMMUNITY): Payer: Self-pay

## 2023-10-23 ENCOUNTER — Other Ambulatory Visit: Payer: Self-pay

## 2023-10-23 DIAGNOSIS — N1832 Chronic kidney disease, stage 3b: Secondary | ICD-10-CM

## 2023-10-23 DIAGNOSIS — E782 Mixed hyperlipidemia: Secondary | ICD-10-CM

## 2023-10-23 DIAGNOSIS — I110 Hypertensive heart disease with heart failure: Secondary | ICD-10-CM

## 2023-10-23 DIAGNOSIS — E038 Other specified hypothyroidism: Secondary | ICD-10-CM

## 2023-10-23 DIAGNOSIS — E1140 Type 2 diabetes mellitus with diabetic neuropathy, unspecified: Secondary | ICD-10-CM

## 2023-10-24 ENCOUNTER — Other Ambulatory Visit: Payer: Medicare PPO

## 2023-10-24 ENCOUNTER — Ambulatory Visit: Payer: Medicare PPO | Attending: Cardiovascular Disease | Admitting: Student

## 2023-10-24 DIAGNOSIS — I10 Essential (primary) hypertension: Secondary | ICD-10-CM | POA: Diagnosis not present

## 2023-10-24 DIAGNOSIS — E782 Mixed hyperlipidemia: Secondary | ICD-10-CM | POA: Diagnosis not present

## 2023-10-24 DIAGNOSIS — I110 Hypertensive heart disease with heart failure: Secondary | ICD-10-CM | POA: Diagnosis not present

## 2023-10-24 DIAGNOSIS — M1A9XX1 Chronic gout, unspecified, with tophus (tophi): Secondary | ICD-10-CM | POA: Diagnosis not present

## 2023-10-24 DIAGNOSIS — Z794 Long term (current) use of insulin: Secondary | ICD-10-CM | POA: Diagnosis not present

## 2023-10-24 DIAGNOSIS — E1140 Type 2 diabetes mellitus with diabetic neuropathy, unspecified: Secondary | ICD-10-CM | POA: Diagnosis not present

## 2023-10-24 DIAGNOSIS — I5030 Unspecified diastolic (congestive) heart failure: Secondary | ICD-10-CM

## 2023-10-24 DIAGNOSIS — N183 Chronic kidney disease, stage 3 unspecified: Secondary | ICD-10-CM

## 2023-10-24 DIAGNOSIS — Z01810 Encounter for preprocedural cardiovascular examination: Secondary | ICD-10-CM

## 2023-10-24 NOTE — Progress Notes (Signed)
Virtual Visit via Telephone Note   Because of Pamela [name]'s co-morbid illnesses, she is at least at moderate risk for complications without adequate follow up.  This format is felt to be most appropriate for this patient at this time.  The patient did not have access to video technology/had technical difficulties with video requiring transitioning to audio format only (telephone).  All issues noted in this document were discussed and addressed.  No physical exam could be performed with this format.  Please refer to the patient's chart for her consent to telehealth for [facility].  Evaluation Performed:  Preoperative cardiovascular risk assessment _____________   Date:  10/24/2023   Patient ID:  Pamela [name], DOB May 03, 1946, MRN [id number] Patient Location:  Home Provider location:   Office  Primary Care Provider:  Blane Ohara, MD Primary Cardiologist:  Thomasene Ripple, DO  Chief Complaint / Patient Profile   78 y.o. 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 Danielle Dess and presents today for telephonic preoperative cardiovascular risk assessment.  History of Present Illness    Pamela [name] is a 78 y.o. female who presents via audio/video conferencing for a telehealth visit today.  Pt was last seen in cardiology clinic on 06/28/2023 by Dr. Servando Salina.  At that time Pamela [name] was complaining of chest tightness and had frequent PVCs on EKG.  She had a normal PET stress test in September 2024. 7-day ZIO showed frequent PVCs and medications were adjusted.  The patient is now pending procedure as outlined above. Since her last visit, she is doing well. Patient denies shortness of breath, dyspnea on exertion, lower extremity  edema, orthopnea or PND. No chest pain, pressure, or tightness. She has daily palpitations described as skipping. She likes to walk for exercise weather permitting. She has been somewhat limited with her exercise, as the weather has been very cold this season. She also had a flare of foot pain preventing her from walking around the grocery store the last few weeks. She is independent with ADLs and performing light tom moderate household activities. She does pace herself when performing moderate activities.   Past Medical History    Past Medical History:  Diagnosis Date   Anticoagulated on Coumadin    chronic--- managed by hematology/ oncology,   Asthma, mild intermittent    followed by pcp--- per pt last exacerbation winter 2021 w/ acute bronchitis   Bilateral leg cramps    Blood dyscrasia    Chronic constipation    CKD (chronic kidney disease), stage III (HCC)    Closed bimalleolar fracture of left ankle 02/26/2020   DDD (degenerative disc disease), cervical    w/ spondylosis,  per pt last steroid injection 06/ 2022   DDD (degenerative disc disease), lumbosacral    DVT (deep venous thrombosis) (HCC) 07/30/2013   Dyspnea    occasionally   Dysrhythmia    GERD (gastroesophageal reflux disease)    History of cardiac murmur as a child    History of DVT of lower extremity    left lower extremity in 1980s, fell when bowling   History of pulmonary embolus (PE) 1993   per pt left lung post op 2 wks cholecystectomy   History of rheumatic fever as a child    per last echo 12-31-2019 no valvular issues  History of TIA (transient ischemic attack)    2014 and 2018 or 2019,  per pt no residual   HTN (hypertension)    followed by pcp   Hyperlipidemia    Hypothyroidism    IDA (iron deficiency anemia)    Macular degeneration of both eyes    Mild obstructive sleep apnea    per pt dx 2017 tried to uses cpap but intolerant   Mixed incontinence urge and stress    urologist--- dr Saddie Benders   OA  (osteoarthritis) 07/06/2018   Osteoporosis    PAF (paroxysmal atrial fibrillation) (HCC) 10/01/2014   cardiologist--- dr Lavona Mound tobb;   cardiac cath 02-18-2013 normal coronaries arteries, ef 50%, cath done since echo showed ef 30-35%; nucleat stress study 04/ 2020 normal , normal echo 04/ 2021,  event monitor 09-14-2020 rare ST/AT variable block   Pneumonia    PONV (postoperative nausea and vomiting)    Protein S deficiency (HCC)    followed by hemotology/ oncology-- dr d. Melvyn Neth ( [facility]) dx 1980s;  prior DVT left lower leg 1980s and left lung PE 1993; chronic  coumadin since 1980s   PVC's (premature ventricular contractions)    followed by cardiology   S/P cardiac catheterization 02/2013   Normal coronaries; low normal EF at 50%   Solitary pulmonary nodule on lung CT 02/06/2019   First noted 01/13/2014 > no change as of 12/21/2018   Spondylolisthesis, lumbar region 08/09/2018   Stroke (HCC)    TIA in 2018 or 2019   Transient ischemic attack 07/30/2013   Type 2 diabetes mellitus treated with insulin (HCC)    endocrilogist--- whitney reardon NP     (03-10-2021  pt continuously checks blood sugar throughout the day w/ Libre, fasting sugar --- 69--200)   Wears glasses    Wears hearing aid in both ears    Past Surgical History:  Procedure Laterality Date   ABDOMINAL HYSTERECTOMY     BLEPHAROPLASTY     CARDIAC CATHETERIZATION  02/18/2013   @ [facility]  by Dr [place];  normal coronaries w/ preserved LVF, ef 50%;   previous cath 2001 normal ef 65%   CARPAL TUNNEL RELEASE Bilateral 1994   CATARACT EXTRACTION W/ INTRAOCULAR LENS IMPLANT Bilateral 2017   CHOLECYSTECTOMY     CHOLECYSTECTOMY, LAPAROSCOPIC  1993   COLONOSCOPY     EAR BIOPSY Left    FINGER SURGERY Left 2018   thumb   FOOT SURGERY     FOOT TENDON SURGERY Right    early 2000s   FRACTURE SURGERY Left 02/13/2020   left femur   HAND SURGERY Right 04/2022   HARDWARE REMOVAL Left 03/12/2021   Procedure: HARDWARE  REMOVAL;  Surgeon: Yolonda Kida, MD;  Location: [facility];  Service: Orthopedics;  Laterality: Left;  60 MINS   JOINT REPLACEMENT     LUMBAR LAMINECTOMY/DECOMPRESSION MICRODISCECTOMY N/A 12/22/2022   Procedure: Lumbar Two-Lumbar Three, Lumbar Three-Lumbar Four Lumbar Decompression;  Surgeon: Barnett Abu, MD;  Location: [facility] OR;  Service: Neurosurgery;  Laterality: N/A;  RM 20 3C   ORIF ANKLE FRACTURE Left 02/26/2020   Procedure: OPEN REDUCTION INTERNAL FIXATION (ORIF) ANKLE FRACTURE;  Surgeon: Yolonda Kida, MD;  Location: [facility] OR;  Service: Orthopedics;  Laterality: Left;  90 mins   TONSILLECTOMY AND ADENOIDECTOMY Bilateral    TOTAL KNEE ARTHROPLASTY Left 03/2005   TOTAL VAGINAL HYSTERECTOMY  1988   per pt still has ovaries    Allergies  Allergies  Allergen Reactions   Ketek [  Telithromycin] Nausea And Vomiting and Rash   Loxapine Succinate Hives   Macrodantin [Nitrofurantoin Macrocrystal] Other (See Comments)    Pulmonary fibrosis   Naproxen Sodium Anaphylaxis and Hives   Amoxapine And Related Hives   Darvon Nausea And Vomiting   Belsomra [Suvorexant] Other (See Comments)    "Nightmares"   Cymbalta [Duloxetine Hcl] Other (See Comments)    "Blackouts"   Duloxetine    Gabapentin Other (See Comments)    Blurry vision   Lyrica [Pregabalin]     Makes her sedated   Ozempic (0.25 Or 0.5 Mg-Dose) [Semaglutide(0.25 Or 0.5mg -Dos)] Diarrhea   Semaglutide Diarrhea    Rybelsus   Amoxicillin Rash   Propoxyphene Nausea And Vomiting    Home Medications    Prior to Admission medications   Medication Sig Start Date End Date Taking? Authorizing Provider  acetaminophen (TYLENOL) 650 MG CR tablet Take 650-1,300 mg by mouth every 8 (eight) hours as needed for pain.    [provider]  albuterol (VENTOLIN HFA) 108 (90 Base) MCG/ACT inhaler INHALE 1 TO 2 PUFFS INTO THE LUNGS EVERY 6 HOURS AS NEEDED FOR WHEEZING OR SHORTNESS OF BREATH 02/04/22   Marianne Sofia,  PA-C  amoxicillin-clavulanate (AUGMENTIN) 875-125 MG tablet Take 1 tablet by mouth 2 (two) times daily. Patient not taking: Reported on 10/10/2023 10/02/23   [provider]  apixaban (ELIQUIS) 5 MG TABS tablet Take 1 tablet (5 mg total) by mouth 2 (two) times daily. 09/26/23   Mosher, Harvin Hazel A, PA-C  aspirin EC (ASPIRIN LOW DOSE) 81 MG tablet TAKE 1 TABLET(81 MG) BY MOUTH DAILY 08/02/22   Tobb, Kardie, DO  atorvastatin (LIPITOR) 80 MG tablet TAKE 1 TABLET(80 MG) BY MOUTH DAILY 08/07/23   Tobb, Kardie, DO  carboxymethylcellulose (REFRESH PLUS) 0.5 % SOLN Place 1 drop into both eyes 3 (three) times daily as needed (dry eyes).    [provider]  Cholecalciferol (VITAMIN D3) 50 MCG (2000 UT) TABS Take 4,000 Units by mouth daily with lunch.    [provider]  Continuous Glucose Sensor (FREESTYLE LIBRE 2 SENSOR) MISC 2 each by Does not apply route every 14 (fourteen) days. E11.40 10/05/23   Shamleffer, Konrad Dolores, MD  cyanocobalamin (VITAMIN B12) 1000 MCG tablet Take 1,000 mcg by mouth daily.    [provider]  dapagliflozin propanediol (FARXIGA) 10 MG TABS tablet Take 1 tablet (10 mg total) by mouth daily before breakfast. 10/05/23   Shamleffer, Konrad Dolores, MD  diltiazem (CARDIZEM CD) 180 MG 24 hr capsule Take 1 capsule (180 mg total) by mouth daily. 08/07/23 11/05/23  Tobb, Kardie, DO  Dulaglutide (TRULICITY) 0.75 MG/0.5ML SOAJ Inject 0.75 mg into the skin once a week. 10/05/23   Shamleffer, Konrad Dolores, MD  estradiol (ESTRACE) 0.1 MG/GM vaginal cream Place 1 Applicatorful vaginally 2 (two) times a week. As needed 06/10/20   [provider]  ferrous sulfate 325 (65 FE) MG tablet Take 325 mg by mouth daily with lunch.    [provider]  furosemide (LASIX) 40 MG tablet TAKE 1 TABLET(40 MG) BY MOUTH DAILY Patient not taking: Reported on 10/10/2023 08/07/23   Tobb, Kardie, DO  GEMTESA 75 MG TABS Take 75 mg by mouth at bedtime. 11/09/21   [provider]  hydrochlorothiazide (HYDRODIURIL) 12.5 MG tablet Take 12.5 mg by mouth daily. 09/26/23   [provider]  insulin aspart (NOVOLOG FLEXPEN) 100 UNIT/ML FlexPen Max daily 30 units 10/05/23   Shamleffer, Konrad Dolores, MD  Insulin Pen Needle (BD  PEN NEEDLE NANO 2ND GEN) 32G X 4 MM MISC 1 Device by Other route in the morning, at noon, in the evening, and at bedtime. USE TO INJECT INSULIN four times DAILY 10/05/23   Shamleffer, Konrad Dolores, MD  isosorbide mononitrate (IMDUR) 30 MG 24 hr tablet TAKE 1 TABLET BY MOUTH DAILY. PLEASE ARRANGE APPT FOR FURTHER REFILLS 08/07/23   Tobb, Kardie, DO  lisinopril (ZESTRIL) 20 MG tablet TAKE 1 TABLETS BY MOUTH TWICE DAILY 07/17/23   Cox, Kirsten, MD  magnesium oxide (MAG-OX) 400 (240 Mg) MG tablet TAKE 2 TABLETS(800 MG) BY MOUTH TWICE DAILY Patient taking differently: Take 800 mg by mouth 2 (two) times daily. 02/15/21   [name], MD  Multiple Vitamins-Minerals (PRESERVISION AREDS PO) Take 1 capsule by mouth 2 (two) times daily.    [provider]  nystatin cream (MYCOSTATIN) Apply 1 Application topically 4 (four) times daily as needed for dry skin (yeast infection).    [provider]  ondansetron (ZOFRAN-ODT) 8 MG disintegrating tablet Take 8 mg by mouth every 8 (eight) hours as needed for nausea or vomiting.     [provider]  pantoprazole (PROTONIX) 40 MG tablet TAKE 1 TABLET(40 MG) BY MOUTH EVERY MORNING 05/08/23   Cox, Kirsten, MD  polyethylene glycol powder (MIRALAX) 17 GM/SCOOP powder Take 17 g by mouth daily as needed for moderate constipation.    [provider]  pyridoxine (B-6) 100 MG tablet Take 100 mg by mouth daily.    [provider]  sennosides-docusate sodium (SENOKOT-S) 8.6-50 MG tablet Take 1 tablet by mouth daily.    [provider]  sodium chloride (OCEAN) 0.65 % SOLN nasal spray Place 1 spray into both nostrils as needed for congestion.    [provider]  zolpidem (AMBIEN) 5 MG tablet TAKE 1 TABLET(5 MG) BY MOUTH AT BEDTIME AS NEEDED FOR SLEEP 10/14/23   Blane Ohara, MD    Physical Exam    Vital Signs:  Pamela [name] does not have vital signs available for review today.  Given telephonic nature of communication, physical exam is limited. AAOx3. NAD. Normal affect.  Speech and respirations are unlabored.  Accessory Clinical Findings    Cardiac Studies & Procedures   ______________________________________________________________________________________________   STRESS TESTS  NM PET CT CARDIAC PERFUSION MULTI W/ABSOLUTE BLOODFLOW 05/16/2023  Narrative   Normal perfusion images.  Myocardial blood flow reserve is decreased, but this is due to high resting flows (stress flows are normal); this could represent microvascular disease.  No other high risk findings such as TID or drop in EF with stress. Study is low risk   LV perfusion is normal. There is no evidence of ischemia. There is no evidence of infarction.   Rest left ventricular function is normal. Rest EF: 54%. Stress left ventricular function is normal. Stress EF: 65%. End diastolic cavity size is normal. End systolic cavity size is normal.   Myocardial blood flow was computed to be 1.29ml/g/min at rest and 2.12ml/g/min at stress. Global myocardial blood flow reserve was 1.48 and was abnormal.   Coronary calcium was present on the attenuation correction CT images. Mild coronary calcifications were present. Coronary calcifications were present in the left anterior descending artery distribution(s).   Findings are consistent with no ischemia. The study is low risk.   Electronically signed by Epifanio Lesches, MD  CLINICAL DATA:  This over-read does not include interpretation of cardiac or coronary anatomy or pathology. The Cardiac PET CT interpretation by the  cardiologist is attached.  COMPARISON:  CTA chest dated 06/06/2022  FINDINGS: Vascular: No  evidence of thoracic aortic aneurysm. Atherosclerotic calcifications of the aortic arch.  The heart is top normal in size.  No pericardial effusion.  Mild coronary atherosclerosis of the LAD and left circumflex.  Mediastinum/Nodes: No suspicious mediastinal lymphadenopathy.  Lungs/Pleura: Mild platelike scarring/atelectasis in the right upper lobe. Mild lingular scarring/atelectasis.  Mild subpleural reticulation/fibrosis in the bilateral lower lobes, suggesting mild superimposed chronic interstitial lung disease.  No focal consolidation.  No suspicious pulmonary nodules.  No pleural effusion or pneumothorax.  Upper Abdomen: Visualized upper abdomen is notable for suspected prior cholecystectomy and vascular calcifications.  Musculoskeletal: Degenerative changes of the thoracic spine.  IMPRESSION: Mild subpleural reticulation/fibrosis in the bilateral lower lobes, suggesting mild chronic interstitial lung disease.  Otherwise, no significant extracardiac findings.  Aortic Atherosclerosis (ICD10-I70.0).   Electronically Signed By: Charline Bills M.D. On: 05/16/2023 10:23   ECHOCARDIOGRAM  ECHOCARDIOGRAM COMPLETE 04/25/2023  Narrative ECHOCARDIOGRAM REPORT    Patient Name:   Pamela [name] Date of Exam: 04/25/2023 Medical Rec #:  [id number]                    Height:       61.0 in Accession #:    [id number]                   Weight:       145.6 lb Date of Birth:  [birth year]                    BSA:          1.650 m Patient Age:    77 years                     BP:           147/66 mmHg Patient Gender: F                            HR:           71 bpm. Exam Location:  [address]  Procedure: 2D Echo, 3D Echo, Cardiac Doppler, Color Doppler and Strain Analysis  Indications:    R06.00 DOE  History:        Patient has prior history of Echocardiogram examinations, most recent 12/31/2019. CAD, Arrythmias:PVC, Signs/Symptoms:Palpitations; Risk  Factors:Hypertension.  Sonographer:    Clearence Ped RCS Referring Phys: [id number] Carlos Levering  IMPRESSIONS   1. Left ventricular ejection fraction, by estimation, is 60 to 65%. The left ventricle has normal function. The left ventricle has no regional wall motion abnormalities. There is moderate asymmetric left ventricular hypertrophy of the inferior segment. Left ventricular diastolic parameters are consistent with Grade I diastolic dysfunction (impaired relaxation). 2. Right ventricular systolic function is normal. The right ventricular size is normal. 3. The mitral valve is normal in structure. No evidence of mitral valve regurgitation. No evidence of mitral stenosis. 4. The aortic valve is normal in structure. Aortic valve regurgitation is not visualized. No aortic stenosis is present. 5. The inferior vena cava is normal in size with greater than 50% respiratory variability, suggesting right atrial pressure of 3 mmHg.  FINDINGS Left Ventricle: Left ventricular ejection fraction, by estimation, is 60 to 65%. The left ventricle has normal function. The left ventricle has no regional wall motion abnormalities. The left ventricular internal cavity size was normal in size. There  is moderate asymmetric left ventricular hypertrophy of the inferior segment. Left ventricular diastolic parameters are consistent with Grade I diastolic dysfunction (impaired relaxation).  Right Ventricle: The right ventricular size is normal. No increase in right ventricular wall thickness. Right ventricular systolic function is normal.  Left Atrium: Left atrial size was normal in size.  Right Atrium: Right atrial size was normal in size.  Pericardium: There is no evidence of pericardial effusion.  Mitral Valve: The mitral valve is normal in structure. No evidence of mitral valve regurgitation. No evidence of mitral valve stenosis.  Tricuspid Valve: The tricuspid valve is normal in structure. Tricuspid  valve regurgitation is not demonstrated. No evidence of tricuspid stenosis.  Aortic Valve: The aortic valve is normal in structure. Aortic valve regurgitation is not visualized. No aortic stenosis is present.  Pulmonic Valve: The pulmonic valve was normal in structure. Pulmonic valve regurgitation is not visualized. No evidence of pulmonic stenosis.  Aorta: The aortic root is normal in size and structure.  Venous: The inferior vena cava is normal in size with greater than 50% respiratory variability, suggesting right atrial pressure of 3 mmHg.  IAS/Shunts: No atrial level shunt detected by color flow Doppler.   LEFT VENTRICLE PLAX 2D LVIDd:         4.80 cm   Diastology LVIDs:         3.60 cm   LV e' medial:    5.98 cm/s LV PW:         1.30 cm   LV E/e' medial:  12.3 LV IVS:        1.10 cm   LV e' lateral:   5.66 cm/s LVOT diam:     1.90 cm   LV E/e' lateral: 13.0 LV SV:         53 LV SV Index:   32 LVOT Area:     2.84 cm  3D Volume EF: 3D EF:        49 % LV EDV:       116 ml LV ESV:       59 ml LV SV:        57 ml  RIGHT VENTRICLE RV Basal diam:  3.10 cm RV S prime:     13.30 cm/s TAPSE (M-mode): 2.7 cm  LEFT ATRIUM             Index        RIGHT ATRIUM          Index LA diam:        3.80 cm 2.30 cm/m   RA Area:     8.79 cm LA Vol (A2C):   40.9 ml 24.78 ml/m  RA Volume:   17.80 ml 10.79 ml/m LA Vol (A4C):   26.0 ml 15.75 ml/m LA Biplane Vol: 32.5 ml 19.69 ml/m AORTIC VALVE LVOT Vmax:   63.90 cm/s LVOT Vmean:  45.800 cm/s LVOT VTI:    0.188 m  AORTA Ao Root diam: 2.80 cm Ao Asc diam:  2.90 cm  MITRAL VALVE MV Area (PHT):              SHUNTS MV Decel Time:              Systemic VTI:  0.19 m MV E velocity: 73.40 cm/s   Systemic Diam: 1.90 cm MV A velocity: 101.00 cm/s MV E/A ratio:  0.73  Kardie Tobb DO Electronically signed by Thomasene Ripple DO Signature Date/Time: 04/25/2023/3:11:03 PM    Final  MONITORS  LONG TERM MONITOR (3-14 DAYS)  07/13/2023  Narrative Patch Wear Time:  7 days and 21 hours (2024-10-25T12:46:10-398 to 2024-11-02T09:55:35-0400)  Patient had a min HR of 46 bpm, max HR of 184 bpm, and avg HR of 73 bpm. Predominant underlying rhythm was Sinus Rhythm.  13 Ventricular Tachycardia runs occurred, the run with the fastest interval lasting 5 beats with a max rate of 184 bpm, the longest lasting 6 beats with an avg rate of 111 bpm.  9 Supraventricular Tachycardia runs occurred, the run with the fastest interval lasting 5 beats with a max rate of 174 bpm, the longest lasting 9 beats with an avg rate of 125 bpm. Isolated SVEs were rare (<1.0%), SVE Couplets were rare (<1.0%), and SVE Triplets were rare (<1.0%). Isolated VEs were frequent (21.5%, [id number]), VE Couplets were occasional (1.6%, 6942), and VE Triplets were rare (<1.0%, 1704). Ventricular Bigeminy and Trigeminy were present.  Symptoms associated with premature ventricular complexes.  Conclusion; The study showed evidence of symptomatic nonsustained ventricular tachycardia and symptomatic frequent premature atrial complexes.   CT SCANS  CT CORONARY MORPH W/CTA COR W/SCORE 10/02/2020  Addendum 10/02/2020  9:53 PM ADDENDUM REPORT: 10/02/2020 21:51  CLINICAL DATA:  This is a 78 year old female with chest pain and shortness of breath.  EXAM: Cardiac/Coronary  CT  TECHNIQUE: The patient was scanned on a [employer].  FINDINGS: A 120 kV retrospective scan was triggered in the descending thoracic aorta at 111 HU's. Axial non-contrast 3 mm slices were carried out through the heart. The data set was analyzed on a dedicated work station and scored using the Agatson method. Gantry rotation speed was 250 msecs and collimation was .6 mm. No beta blockade and 0.8 mg of sl NTG was given. The 3D data set was reconstructed in 5% intervals of the 67-82 % of the R-R cycle. Diastolic phases were analyzed on a dedicated work station using MPR, MIP and VRT  modes. The patient received 80 cc of contrast.  Aorta: Normal size. Mild proximal ascending and descending aorta calcifications. No dissection.  Aortic Valve:  Trileaflet.  No calcifications.  Coronary calcium score - 20.4  Coronary Arteries:  Normal coronary origin.  Right dominance.  RCA is a large dominant artery that gives rise to PDA and PLVB. There is no plaque.  Left main is a large artery that gives rise to LAD and LCX arteries.  LAD is a large vessel. There is minimal (<24%) CAD. There is no plaques in the mid or distal LAD.  LCX is a non-dominant artery that gives rise to one large OM1 branch. There is no plaque.  Other findings:  Normal pulmonary vein drainage into the left atrium.  Normal left atrial appendage without a thrombus.  Normal size of the pulmonary artery.  Mild Mitral annular calcification.  IMPRESSION: 1. Coronary calcium score of 20.4. This was 78 percentile for age and sex matched control.  2. Normal coronary origin with right dominance.  3. Minimal CAD. CADRADS 1. Aggressive medical therapy is recommended.  4. Mild mitral annular calcification.  5. Aortic Atherosclerosis.  Thomasene Ripple, DO   Electronically Signed By: Thomasene Ripple DO On: 10/02/2020 21:51  Narrative EXAM: OVER-READ INTERPRETATION  CT CHEST  The following report is an over-read performed by radiologist Dr. Trudie Reed of [facility] on 10/02/2020. This over-read does not include interpretation of cardiac or coronary anatomy or pathology. The coronary calcium score/coronary CTA interpretation by the cardiologist is attached.  COMPARISON:  None.  FINDINGS: Aortic atherosclerosis. Within the visualized portions of the thorax there are no suspicious appearing pulmonary nodules or masses, there is no acute consolidative airspace disease, no pleural effusions, no pneumothorax and no lymphadenopathy. Visualized portions of the upper abdomen are  unremarkable. There are no aggressive appearing lytic or blastic lesions noted in the visualized portions of the skeleton.  IMPRESSION: 1.  Aortic Atherosclerosis (ICD10-I70.0).  Electronically Signed: By: Trudie Reed M.D. On: 10/02/2020 09:43     ______________________________________________________________________________________________       Assessment & Plan    Primary Cardiologist: Thomasene Ripple, DO  Preoperative cardiovascular risk assessment.  Lumbar ESI by Dr. Danielle Dess.  Chart reviewed as part of pre-operative protocol coverage. According to the RCRI, patient has a 6.6% risk of MACE. Patient reports activity equivalent to 5.07 METS (Per DASI).   Given past medical history and time since last visit, based on ACC/AHA guidelines, Pamela [name] would be at acceptable risk for the planned procedure without further cardiovascular testing.   Patient was advised that if she develops new symptoms prior to surgery to contact our office to arrange a follow-up appointment.  she verbalized understanding.  Eliquis prescribed by a noncardiology provider (hematology) therefore recommendations for holding deferred to prescribing provider.    I will route this recommendation to the requesting party via Epic fax function.  Please call with questions.  Time:   Today, I have spent 9 minutes with the patient with telehealth technology discussing medical history, symptoms, and management plan.     Carlos Levering, NP  10/24/2023, 7:40 AM

## 2023-10-25 ENCOUNTER — Encounter: Payer: Self-pay | Admitting: Family Medicine

## 2023-10-25 LAB — URIC ACID: Uric Acid: 4.4 mg/dL (ref 3.1–7.9)

## 2023-10-25 NOTE — Progress Notes (Deleted)
Subjective:  Patient ID: Pamela [name], female    DOB: June 04, 1946  Age: 78 y.o. MRN: [id number]  Chief Complaint  Patient presents with   Medical Management of Chronic Issues    HPI    Diabetes:  Complications: Hypertension Glucose checking: Several times a day (CGM) (69-629). Sees Endocrinology.  Hypoglycemia: 2-3 weekly  Most recent A1C: 7.5% Current medications: Farxiga 10 mg daily, Toujeo 6 units daily.   NovoLog 4 unites for breakfast and lunch, 5 units supper, trulicity 0.75 weekly Last Eye Exam: UTD has another appt 12/24 Foot checks: several days per week..   Hyperlipidemia: Current medications: Atorvastatin 80 mg daily.   Hypertension: Complications: Diabetes, Hyperlipidemia. Current medications: Lisinopril 20 mg 2 daily, Amlodipine 5 mg daily, Aspirin 81 mg daily, Isosorbide 30 mg daily.   Atrial fibrillation: on coumadin.   GERD: protonix 40 mg once daily.    Protein S Deficiency: Dr. Melvyn Neth manages her coumadin. On coumadin daily. Uses MD PT/INR at home. *  5 mg wed and sat and 4 mg all other days.   Urge incontinence: on gemtesa 75 mg once daily. Working well.      04/17/2023    8:55 AM 03/30/2023    2:14 PM 12/14/2022    9:06 AM 02/15/2022    1:16 PM 01/24/2022    8:21 AM  Depression screen PHQ 2/9  Decreased Interest 1 0 0 0 0  Down, Depressed, Hopeless 0 0 0 0 0  PHQ - 2 Score 1 0 0 0 0  Altered sleeping 3      Tired, decreased energy 2      Change in appetite 0      Feeling bad or failure about yourself  0      Trouble concentrating 1      Moving slowly or fidgety/restless 1      Suicidal thoughts 0      PHQ-9 Score 8      Difficult doing work/chores Somewhat difficult            09/27/2023    1:13 PM  Fall Risk   Falls in the past year? 0    Patient Care Team: Blane Ohara, MD as PCP - General (Internal Medicine) Thomasene Ripple, DO as PCP - Cardiology (Cardiology) Weston Settle, MD as Consulting Physician  (Oncology) Roxanne Gates, OD (Optometry) Ortho, [facility] (Specialist) Nyoka Cowden, MD as Consulting Physician (Pulmonary Disease) Drema Halon, MD (Inactive) as Consulting Physician (Otolaryngology) Thomasene Ripple, DO as Consulting Physician (Cardiology) Romero Belling, MD (Inactive) as Consulting Physician (Endocrinology) Zenovia Jordan, MD as Consulting Physician (Rheumatology) Armbruster, Willaim Rayas, MD as Consulting Physician (Gastroenterology)   Review of Systems  Current Outpatient Medications on File Prior to Visit  Medication Sig Dispense Refill   acetaminophen (TYLENOL) 650 MG CR tablet Take 650-1,300 mg by mouth every 8 (eight) hours as needed for pain.     albuterol (VENTOLIN HFA) 108 (90 Base) MCG/ACT inhaler INHALE 1 TO 2 PUFFS INTO THE LUNGS EVERY 6 HOURS AS NEEDED FOR WHEEZING OR SHORTNESS OF BREATH 6.7 g 3   amoxicillin-clavulanate (AUGMENTIN) 875-125 MG tablet Take 1 tablet by mouth 2 (two) times daily. (Patient not taking: Reported on 10/10/2023)     apixaban (ELIQUIS) 5 MG TABS tablet Take 1 tablet (5 mg total) by mouth 2 (two) times daily. 60 tablet 2   aspirin EC (ASPIRIN LOW DOSE) 81 MG tablet TAKE 1 TABLET(81 MG) BY MOUTH DAILY 90 tablet 3  atorvastatin (LIPITOR) 80 MG tablet TAKE 1 TABLET(80 MG) BY MOUTH DAILY 90 tablet 3   carboxymethylcellulose (REFRESH PLUS) 0.5 % SOLN Place 1 drop into both eyes 3 (three) times daily as needed (dry eyes).     Cholecalciferol (VITAMIN D3) 50 MCG (2000 UT) TABS Take 4,000 Units by mouth daily with lunch.     Continuous Glucose Sensor (FREESTYLE LIBRE 2 SENSOR) MISC 2 each by Does not apply route every 14 (fourteen) days. E11.40 6 each 3   cyanocobalamin (VITAMIN B12) 1000 MCG tablet Take 1,000 mcg by mouth daily.     dapagliflozin propanediol (FARXIGA) 10 MG TABS tablet Take 1 tablet (10 mg total) by mouth daily before breakfast. 90 tablet 3   diltiazem (CARDIZEM CD) 180 MG 24 hr capsule Take 1 capsule (180 mg total) by mouth  daily. 90 capsule 3   Dulaglutide (TRULICITY) 0.75 MG/0.5ML SOAJ Inject 0.75 mg into the skin once a week. 6 mL 3   estradiol (ESTRACE) 0.1 MG/GM vaginal cream Place 1 Applicatorful vaginally 2 (two) times a week. As needed     ferrous sulfate 325 (65 FE) MG tablet Take 325 mg by mouth daily with lunch.     furosemide (LASIX) 40 MG tablet TAKE 1 TABLET(40 MG) BY MOUTH DAILY (Patient not taking: Reported on 10/10/2023) 90 tablet 3   GEMTESA 75 MG TABS Take 75 mg by mouth at bedtime.     hydrochlorothiazide (HYDRODIURIL) 12.5 MG tablet Take 12.5 mg by mouth daily.     insulin aspart (NOVOLOG FLEXPEN) 100 UNIT/ML FlexPen Max daily 30 units 30 mL 3   Insulin Pen Needle (BD PEN NEEDLE NANO 2ND GEN) 32G X 4 MM MISC 1 Device by Other route in the morning, at noon, in the evening, and at bedtime. USE TO INJECT INSULIN four times DAILY 400 each 3   isosorbide mononitrate (IMDUR) 30 MG 24 hr tablet TAKE 1 TABLET BY MOUTH DAILY. PLEASE ARRANGE APPT FOR FURTHER REFILLS 90 tablet 3   lisinopril (ZESTRIL) 20 MG tablet TAKE 1 TABLETS BY MOUTH TWICE DAILY 180 tablet 3   magnesium oxide (MAG-OX) 400 (240 Mg) MG tablet TAKE 2 TABLETS(800 MG) BY MOUTH TWICE DAILY (Patient taking differently: Take 800 mg by mouth 2 (two) times daily.) 120 tablet 2   Multiple Vitamins-Minerals (PRESERVISION AREDS PO) Take 1 capsule by mouth 2 (two) times daily.     nystatin cream (MYCOSTATIN) Apply 1 Application topically 4 (four) times daily as needed for dry skin (yeast infection).     ondansetron (ZOFRAN-ODT) 8 MG disintegrating tablet Take 8 mg by mouth every 8 (eight) hours as needed for nausea or vomiting.      pantoprazole (PROTONIX) 40 MG tablet TAKE 1 TABLET(40 MG) BY MOUTH EVERY MORNING 90 tablet 1   polyethylene glycol powder (MIRALAX) 17 GM/SCOOP powder Take 17 g by mouth daily as needed for moderate constipation.     pyridoxine (B-6) 100 MG tablet Take 100 mg by mouth daily.     sennosides-docusate sodium (SENOKOT-S) 8.6-50  MG tablet Take 1 tablet by mouth daily.     sodium chloride (OCEAN) 0.65 % SOLN nasal spray Place 1 spray into both nostrils as needed for congestion.     zolpidem (AMBIEN) 5 MG tablet TAKE 1 TABLET(5 MG) BY MOUTH AT BEDTIME AS NEEDED FOR SLEEP 30 tablet 5   No current facility-administered medications on file prior to visit.   Past Medical History:  Diagnosis Date   Anticoagulated on Coumadin  chronic--- managed by hematology/ oncology,   Asthma, mild intermittent    followed by pcp--- per pt last exacerbation winter 2021 w/ acute bronchitis   Bilateral leg cramps    Blood dyscrasia    Chronic constipation    CKD (chronic kidney disease), stage III (HCC)    Closed bimalleolar fracture of left ankle 02/26/2020   DDD (degenerative disc disease), cervical    w/ spondylosis,  per pt last steroid injection 06/ 2022   DDD (degenerative disc disease), lumbosacral    DVT (deep venous thrombosis) (HCC) 07/30/2013   Dyspnea    occasionally   Dysrhythmia    GERD (gastroesophageal reflux disease)    History of cardiac murmur as a child    History of DVT of lower extremity    left lower extremity in 1980s, fell when bowling   History of pulmonary embolus (PE) 1993   per pt left lung post op 2 wks cholecystectomy   History of rheumatic fever as a child    per last echo 12-31-2019 no valvular issues   History of TIA (transient ischemic attack)    2014 and 2018 or 2019,  per pt no residual   HTN (hypertension)    followed by pcp   Hyperlipidemia    Hypothyroidism    IDA (iron deficiency anemia)    Macular degeneration of both eyes    Mild obstructive sleep apnea    per pt dx 2017 tried to uses cpap but intolerant   Mixed incontinence urge and stress    urologist--- dr Saddie Benders   OA (osteoarthritis) 07/06/2018   Osteoporosis    PAF (paroxysmal atrial fibrillation) (HCC) 10/01/2014   cardiologist--- dr Lavona Mound tobb;   cardiac cath 02-18-2013 normal coronaries arteries, ef 50%, cath done  since echo showed ef 30-35%; nucleat stress study 04/ 2020 normal , normal echo 04/ 2021,  event monitor 09-14-2020 rare ST/AT variable block   Pneumonia    PONV (postoperative nausea and vomiting)    Protein S deficiency (HCC)    followed by hemotology/ oncology-- dr d. Melvyn Neth ([facility]) dx 1980s;  prior DVT left lower leg 1980s and left lung PE 1993; chronic  coumadin since 1980s   PVC's (premature ventricular contractions)    followed by cardiology   S/P cardiac catheterization 02/2013   Normal coronaries; low normal EF at 50%   Solitary pulmonary nodule on lung CT 02/06/2019   First noted 01/13/2014 > no change as of 12/21/2018   Spondylolisthesis, lumbar region 08/09/2018   Stroke (HCC)    TIA in 2018 or 2019   Transient ischemic attack 07/30/2013   Type 2 diabetes mellitus treated with insulin (HCC)    endocrilogist--- whitney reardon NP     (03-10-2021  pt continuously checks blood sugar throughout the day w/ Libre, fasting sugar --- 69--200)   Wears glasses    Wears hearing aid in both ears    Past Surgical History:  Procedure Laterality Date   ABDOMINAL HYSTERECTOMY     BLEPHAROPLASTY     CARDIAC CATHETERIZATION  02/18/2013   @ [facility]  by Dr [place];  normal coronaries w/ preserved LVF, ef 50%;   previous cath 2001 normal ef 65%   CARPAL TUNNEL RELEASE Bilateral 1994   CATARACT EXTRACTION W/ INTRAOCULAR LENS IMPLANT Bilateral 2017   CHOLECYSTECTOMY     CHOLECYSTECTOMY, LAPAROSCOPIC  1993   COLONOSCOPY     EAR BIOPSY Left    FINGER SURGERY Left 2018   thumb  FOOT SURGERY     FOOT TENDON SURGERY Right    early 2000s   FRACTURE SURGERY Left 02/13/2020   left femur   HAND SURGERY Right 04/2022   HARDWARE REMOVAL Left 03/12/2021   Procedure: HARDWARE REMOVAL;  Surgeon: Yolonda Kida, MD;  Location: [facility];  Service: Orthopedics;  Laterality: Left;  60 MINS   JOINT REPLACEMENT     LUMBAR LAMINECTOMY/DECOMPRESSION MICRODISCECTOMY  N/A 12/22/2022   Procedure: Lumbar Two-Lumbar Three, Lumbar Three-Lumbar Four Lumbar Decompression;  Surgeon: Barnett Abu, MD;  Location: [facility] OR;  Service: Neurosurgery;  Laterality: N/A;  RM 20 3C   ORIF ANKLE FRACTURE Left 02/26/2020   Procedure: OPEN REDUCTION INTERNAL FIXATION (ORIF) ANKLE FRACTURE;  Surgeon: Yolonda Kida, MD;  Location: [facility];  Service: Orthopedics;  Laterality: Left;  90 mins   TONSILLECTOMY AND ADENOIDECTOMY Bilateral    TOTAL KNEE ARTHROPLASTY Left 03/2005   TOTAL VAGINAL HYSTERECTOMY  1988   per pt still has ovaries    Family History  Problem Relation Age of Onset   Cirrhosis Mother    Antithrombin III deficiency Mother        multiple emboli   Ulcerative colitis Mother    Diabetes Father    Coronary artery disease Father    Heart attack Father    Hypertension Father    Kidney disease Father    Hypertension Sister    Diabetes Sister    Heart attack Sister        age 22    Protein S deficiency Daughter    Heart attack Brother    Hypertension Brother    Lung cancer Brother    Stroke Neg Hx    Colitis Neg Hx    Colon polyps Neg Hx    Esophageal cancer Neg Hx    Liver cancer Neg Hx    Pancreatic cancer Neg Hx    Rectal cancer Neg Hx    Stomach cancer Neg Hx    Breast cancer Neg Hx    Social History   Socioeconomic History   Marital status: Married    Spouse name: Jomarie Longs   Number of children: Not on file   Years of education: Not on file   Highest education level: Some college, no degree  Occupational History   Not on file  Tobacco Use   Smoking status: Never   Smokeless tobacco: Never  Vaping Use   Vaping status: Never Used  Substance and Sexual Activity   Alcohol use: No   Drug use: Never   Sexual activity: Not on file    Comment: Hysterectomy  Other Topics Concern   Not on file  Social History Narrative   Lives with spouse in [place].  2 grown daughters.   Retired Print production planner     Social Drivers of Manufacturing engineer Strain: Low Risk  (10/22/2023)   Overall Financial Resource Strain (CARDIA)    Difficulty of Paying Living Expenses: Not very hard  Food Insecurity: No Food Insecurity (10/22/2023)   Hunger Vital Sign    Worried About Running Out of Food in the Last Year: Never true    Ran Out of Food in the Last Year: Never true  Transportation Needs: No Transportation Needs (10/22/2023)   PRAPARE - Administrator, Civil Service (Medical): No    Lack of Transportation (Non-Medical): No  Physical Activity: Inactive (10/22/2023)   Exercise Vital Sign    Days of Exercise per Week: 0  days    Minutes of Exercise per Session: 30 min  Stress: No Stress Concern Present (10/22/2023)   [employer] of Occupational Health - Occupational Stress Questionnaire    Feeling of Stress : Only a little  Social Connections: Socially Integrated (10/22/2023)   Social Connection and Isolation Panel [NHANES]    Frequency of Communication with Friends and Family: More than three times a week    Frequency of Social Gatherings with Friends and Family: Patient declined    Attends Religious Services: More than 4 times per year    Active Member of Clubs or Organizations: Patient declined    Attends Engineer, structural: More than 4 times per year    Marital Status: Married    Objective:  There were no vitals taken for this visit.     10/05/2023    9:29 AM 09/27/2023    1:15 PM 09/27/2023    1:14 PM  BP/Weight  Systolic BP 122 155 155  Diastolic BP 80 78 78  Wt. (Lbs) 147  146.6  BMI 28.71 kg/m2  28.63 kg/m2    Physical Exam  Diabetic Foot Exam - Simple   No data filed      Lab Results  Component Value Date   WBC 7.2 09/27/2023   HGB 13.3 09/27/2023   HCT 40.3 09/27/2023   PLT 268.0 09/27/2023   GLUCOSE 150 (H) 09/27/2023   CHOL 154 04/13/2023   TRIG 67 04/13/2023   HDL 79 04/13/2023   LDLCALC 62 04/13/2023   ALT 33 (H) 07/17/2023   AST 33 07/17/2023   NA 135  09/27/2023   K 4.6 09/27/2023   CL 96 09/27/2023   CREATININE 1.24 (H) 09/27/2023   BUN 30 (H) 09/27/2023   CO2 31 09/27/2023   TSH 1.97 09/27/2023   INR 2.2 07/24/2023   HGBA1C 6.7 (A) 10/05/2023   MICROALBUR 10 06/15/2021      Assessment & Plan:    There are no diagnoses linked to this encounter.   No orders of the defined types were placed in this encounter.   No orders of the defined types were placed in this encounter.    Follow-up: No follow-ups on file.   I,Attie Nawabi I Leal-Borjas,acting as a scribe for Blane Ohara, MD.,have documented all relevant documentation on the behalf of Blane Ohara, MD,as directed by  Blane Ohara, MD while in the presence of Blane Ohara, MD.   An After Visit Summary was printed and given to the patient.  Blane Ohara, MD [facility] [phone]

## 2023-10-26 ENCOUNTER — Ambulatory Visit: Payer: Medicare PPO | Admitting: Family Medicine

## 2023-10-26 LAB — CBC WITH DIFFERENTIAL/PLATELET
Basophils Absolute: 0.1 10*3/uL (ref 0.0–0.2)
Basos: 1 %
EOS (ABSOLUTE): 0.1 10*3/uL (ref 0.0–0.4)
Eos: 2 %
Hematocrit: 41.5 % (ref 34.0–46.6)
Hemoglobin: 13.6 g/dL (ref 11.1–15.9)
Immature Grans (Abs): 0 10*3/uL (ref 0.0–0.1)
Immature Granulocytes: 0 %
Lymphocytes Absolute: 1.6 10*3/uL (ref 0.7–3.1)
Lymphs: 21 %
MCH: 30.8 pg (ref 26.6–33.0)
MCHC: 32.8 g/dL (ref 31.5–35.7)
MCV: 94 fL (ref 79–97)
Monocytes Absolute: 0.8 10*3/uL (ref 0.1–0.9)
Monocytes: 10 %
Neutrophils Absolute: 5 10*3/uL (ref 1.4–7.0)
Neutrophils: 66 %
Platelets: 289 10*3/uL (ref 150–450)
RBC: 4.41 x10E6/uL (ref 3.77–5.28)
RDW: 12.7 % (ref 11.7–15.4)
WBC: 7.6 10*3/uL (ref 3.4–10.8)

## 2023-10-26 LAB — COMPREHENSIVE METABOLIC PANEL
ALT: 26 [IU]/L (ref 0–32)
AST: 27 [IU]/L (ref 0–40)
Albumin: 4.3 g/dL (ref 3.8–4.8)
Alkaline Phosphatase: 170 [IU]/L — ABNORMAL HIGH (ref 44–121)
BUN/Creatinine Ratio: 22 (ref 12–28)
BUN: 24 mg/dL (ref 8–27)
Bilirubin Total: 0.5 mg/dL (ref 0.0–1.2)
CO2: 25 mmol/L (ref 20–29)
Calcium: 9.7 mg/dL (ref 8.7–10.3)
Chloride: 96 mmol/L (ref 96–106)
Creatinine, Ser: 1.08 mg/dL — ABNORMAL HIGH (ref 0.57–1.00)
Globulin, Total: 2.5 g/dL (ref 1.5–4.5)
Glucose: 163 mg/dL — ABNORMAL HIGH (ref 70–99)
Potassium: 5.3 mmol/L — ABNORMAL HIGH (ref 3.5–5.2)
Sodium: 135 mmol/L (ref 134–144)
Total Protein: 6.8 g/dL (ref 6.0–8.5)
eGFR: 52 mL/min/{1.73_m2} — ABNORMAL LOW (ref >59)

## 2023-10-26 LAB — MICROALBUMIN / CREATININE URINE RATIO
Creatinine, Urine: 47.1 mg/dL
Microalb/Creat Ratio: 123 mg/g{creat} — ABNORMAL HIGH (ref 0–29)
Microalbumin, Urine: 58 ug/mL

## 2023-10-26 LAB — LIPID PANEL
Chol/HDL Ratio: 1.9 {ratio} (ref 0.0–4.4)
Cholesterol, Total: 136 mg/dL (ref 100–199)
HDL: 71 mg/dL (ref >39)
LDL Chol Calc (NIH): 52 mg/dL (ref 0–99)
Triglycerides: 61 mg/dL (ref 0–149)
VLDL Cholesterol Cal: 13 mg/dL (ref 5–40)

## 2023-10-26 LAB — HEMOGLOBIN A1C
Est. average glucose Bld gHb Est-mCnc: 157 mg/dL
Hgb A1c MFr Bld: 7.1 % — ABNORMAL HIGH (ref 4.8–5.6)

## 2023-10-28 NOTE — Progress Notes (Unsigned)
 This encounter was created in error - please disregard.

## 2023-10-30 ENCOUNTER — Telehealth: Payer: Self-pay

## 2023-10-30 NOTE — Telephone Encounter (Signed)
 Angie, from [facility] called to inquire if pt would need to bridge with Lovenox (as she will have to stop her Eliquis 5 days before procedure). I notified Dr Melvyn Neth of above. He states pt shouldn't have to be off Eliquis but for 2 days, so she wouldn't need Lovenox bridging. I called the office back and LVM on identified answering machine stating Dr Melvyn Neth' response, & asked them to call us back for further questions.

## 2023-10-31 ENCOUNTER — Ambulatory Visit (INDEPENDENT_AMBULATORY_CARE_PROVIDER_SITE_OTHER): Payer: Medicare PPO | Admitting: Family Medicine

## 2023-10-31 ENCOUNTER — Encounter: Payer: Self-pay | Admitting: Family Medicine

## 2023-10-31 VITALS — BP 138/64 | HR 78 | Temp 97.1°F | Ht 61.5 in | Wt 142.0 lb

## 2023-10-31 DIAGNOSIS — E1140 Type 2 diabetes mellitus with diabetic neuropathy, unspecified: Secondary | ICD-10-CM

## 2023-10-31 DIAGNOSIS — I5030 Unspecified diastolic (congestive) heart failure: Secondary | ICD-10-CM

## 2023-10-31 DIAGNOSIS — K219 Gastro-esophageal reflux disease without esophagitis: Secondary | ICD-10-CM

## 2023-10-31 DIAGNOSIS — E782 Mixed hyperlipidemia: Secondary | ICD-10-CM

## 2023-10-31 DIAGNOSIS — N1831 Chronic kidney disease, stage 3a: Secondary | ICD-10-CM

## 2023-10-31 DIAGNOSIS — K5909 Other constipation: Secondary | ICD-10-CM

## 2023-10-31 DIAGNOSIS — Z794 Long term (current) use of insulin: Secondary | ICD-10-CM

## 2023-10-31 DIAGNOSIS — D6859 Other primary thrombophilia: Secondary | ICD-10-CM | POA: Diagnosis not present

## 2023-10-31 DIAGNOSIS — I110 Hypertensive heart disease with heart failure: Secondary | ICD-10-CM

## 2023-10-31 NOTE — Progress Notes (Unsigned)
 Subjective:  Patient ID: Pamela [name], female    DOB: [birth year]  Age: 78 y.o. MRN: [id number]  Chief Complaint  Patient presents with  . Medical Management of Chronic Issues    HPI:  Diabetes:  Complications: Hypertension Glucose checking: Several times a day (CGM) (40-981). Sees Endocrinology.  Hypoglycemia: 2-3 weekly  Most recent A1C: 7.1% Current medications: Farxiga 10 mg daily, stopped toujeo due to low blood sugars.  NovoLog 4 units for breakfast and lunch, 4 units supper, trulicity 0.75 weekly Last Eye Exam: UTD 12/24 Foot checks: several days per week..   Hyperlipidemia: Current medications: Atorvastatin 80 mg daily.   Hypertension: Complications: Diabetes, Hyperlipidemia. Current medications: Lisinopril 20 mg 2 daily, diltiazem 180 mg once daily,  Aspirin 81 mg daily, Isosorbide 30 mg daily.  150/80s. Took off furosemide and started on hydrochlorothiazide 12.5 mg daily. Dr. Raul Del.    Atrial fibrillation: off coumadin. On eliquis 5 mg twice daily.   On diltiazem 180 mg daily.  Returns on 11/09/2023 for ESI. Plans to hold eliquis for 48 hours prior to injection. Plan to stop aspirin 81 mg five days prior to esi.    GERD: protonix 40 mg once daily.    Protein S Deficiency: ON eLIQUIS.       04/17/2023    8:55 AM 03/30/2023    2:14 PM 12/14/2022    9:06 AM 02/15/2022    1:16 PM 01/24/2022    8:21 AM  Depression screen PHQ 2/9  Decreased Interest 1 0 0 0 0  Down, Depressed, Hopeless 0 0 0 0 0  PHQ - 2 Score 1 0 0 0 0  Altered sleeping 3      Tired, decreased energy 2      Change in appetite 0      Feeling bad or failure about yourself  0      Trouble concentrating 1      Moving slowly or fidgety/restless 1      Suicidal thoughts 0      PHQ-9 Score 8      Difficult doing work/chores Somewhat difficult            10/31/2023    3:50 PM  Fall Risk   Falls in the past year? 0  Number falls in past yr: 0  Injury with Fall? 0  Risk for  fall due to : No Fall Risks  Follow up Falls evaluation completed    Patient Care Team: Blane Ohara, MD as PCP - General (Internal Medicine) Thomasene Ripple, DO as PCP - Cardiology (Cardiology) Weston Settle, MD as Consulting Physician (Oncology) Roxanne Gates, OD (Optometry) Ortho, [facility] (Specialist) Nyoka Cowden, MD as Consulting Physician (Pulmonary Disease) Drema Halon, MD (Inactive) as Consulting Physician (Otolaryngology) Thomasene Ripple, DO as Consulting Physician (Cardiology) Romero Belling, MD (Inactive) as Consulting Physician (Endocrinology) Zenovia Jordan, MD as Consulting Physician (Rheumatology) Armbruster, Willaim Rayas, MD as Consulting Physician (Gastroenterology) Roxanne Gates, OD (Optometry)   Review of Systems  Constitutional:  Positive for fatigue (since starting eliquis.). Negative for chills and fever.  HENT:  Positive for rhinorrhea. Negative for congestion, ear pain and sore throat.   Respiratory:  Negative for cough and shortness of breath.   Cardiovascular:  Negative for chest pain.  Gastrointestinal:  Positive for constipation and diarrhea. Negative for abdominal pain, nausea and vomiting.       Alternates, but taking sennakot every other day which is helping.   Genitourinary:  Negative for dysuria and  urgency.  Musculoskeletal:  Positive for back pain (esi scheduled.). Negative for myalgias.  Neurological:  Negative for dizziness, weakness, light-headedness and headaches.  Psychiatric/Behavioral:  Negative for dysphoric mood. The patient is not nervous/anxious.     Current Outpatient Medications on File Prior to Visit  Medication Sig Dispense Refill  . acetaminophen (TYLENOL) 650 MG CR tablet Take 650-1,300 mg by mouth every 8 (eight) hours as needed for pain.    Marland Kitchen albuterol (VENTOLIN HFA) 108 (90 Base) MCG/ACT inhaler INHALE 1 TO 2 PUFFS INTO THE LUNGS EVERY 6 HOURS AS NEEDED FOR WHEEZING OR SHORTNESS OF BREATH 6.7 g 3  . apixaban (ELIQUIS) 5 MG  TABS tablet Take 1 tablet (5 mg total) by mouth 2 (two) times daily. 60 tablet 2  . aspirin EC (ASPIRIN LOW DOSE) 81 MG tablet TAKE 1 TABLET(81 MG) BY MOUTH DAILY 90 tablet 3  . atorvastatin (LIPITOR) 80 MG tablet TAKE 1 TABLET(80 MG) BY MOUTH DAILY 90 tablet 3  . carboxymethylcellulose (REFRESH PLUS) 0.5 % SOLN Place 1 drop into both eyes 3 (three) times daily as needed (dry eyes).    . Cholecalciferol (VITAMIN D3) 50 MCG (2000 UT) TABS Take 4,000 Units by mouth daily with lunch.    . Continuous Glucose Sensor (FREESTYLE LIBRE 2 SENSOR) MISC 2 each by Does not apply route every 14 (fourteen) days. E11.40 6 each 3  . cyanocobalamin (VITAMIN B12) 1000 MCG tablet Take 1,000 mcg by mouth daily.    . dapagliflozin propanediol (FARXIGA) 10 MG TABS tablet Take 1 tablet (10 mg total) by mouth daily before breakfast. 90 tablet 3  . diltiazem (CARDIZEM CD) 180 MG 24 hr capsule Take 1 capsule (180 mg total) by mouth daily. 90 capsule 3  . Dulaglutide (TRULICITY) 0.75 MG/0.5ML SOAJ Inject 0.75 mg into the skin once a week. 6 mL 3  . estradiol (ESTRACE) 0.1 MG/GM vaginal cream Place 1 Applicatorful vaginally 2 (two) times a week. As needed    . ferrous sulfate 325 (65 FE) MG tablet Take 325 mg by mouth daily with lunch.    . GEMTESA 75 MG TABS Take 75 mg by mouth at bedtime.    . hydrochlorothiazide (HYDRODIURIL) 12.5 MG tablet Take 12.5 mg by mouth daily.    . insulin aspart (NOVOLOG FLEXPEN) 100 UNIT/ML FlexPen Max daily 30 units 30 mL 3  . Insulin Pen Needle (BD PEN NEEDLE NANO 2ND GEN) 32G X 4 MM MISC 1 Device by Other route in the morning, at noon, in the evening, and at bedtime. USE TO INJECT INSULIN four times DAILY 400 each 3  . isosorbide mononitrate (IMDUR) 30 MG 24 hr tablet TAKE 1 TABLET BY MOUTH DAILY. PLEASE ARRANGE APPT FOR FURTHER REFILLS 90 tablet 3  . lisinopril (ZESTRIL) 20 MG tablet TAKE 1 TABLETS BY MOUTH TWICE DAILY 180 tablet 3  . magnesium oxide (MAG-OX) 400 (240 Mg) MG tablet TAKE 2  TABLETS(800 MG) BY MOUTH TWICE DAILY (Patient taking differently: Take 800 mg by mouth 2 (two) times daily.) 120 tablet 2  . Multiple Vitamins-Minerals (PRESERVISION AREDS PO) Take 1 capsule by mouth 2 (two) times daily.    Marland Kitchen nystatin cream (MYCOSTATIN) Apply 1 Application topically 4 (four) times daily as needed for dry skin (yeast infection).    . ondansetron (ZOFRAN-ODT) 8 MG disintegrating tablet Take 8 mg by mouth every 8 (eight) hours as needed for nausea or vomiting.     . polyethylene glycol powder (MIRALAX) 17 GM/SCOOP powder Take 17 g by  mouth daily as needed for moderate constipation.    Marland Kitchen pyridoxine (B-6) 100 MG tablet Take 100 mg by mouth daily.    . sennosides-docusate sodium (SENOKOT-S) 8.6-50 MG tablet Take 1 tablet by mouth daily.    . sodium chloride (OCEAN) 0.65 % SOLN nasal spray Place 1 spray into both nostrils as needed for congestion.    Marland Kitchen zolpidem (AMBIEN) 5 MG tablet TAKE 1 TABLET(5 MG) BY MOUTH AT BEDTIME AS NEEDED FOR SLEEP 30 tablet 5   No current facility-administered medications on file prior to visit.   Past Medical History:  Diagnosis Date  . Anticoagulated on Coumadin    chronic--- managed by hematology/ oncology,  . Asthma, mild intermittent    followed by pcp--- per pt last exacerbation winter 2021 w/ acute bronchitis  . Bilateral leg cramps   . Blood dyscrasia   . Chronic constipation   . CKD (chronic kidney disease), stage III (HCC)   . Closed bimalleolar fracture of left ankle 02/26/2020  . DDD (degenerative disc disease), cervical    w/ spondylosis,  per pt last steroid injection 06/ 2022  . DDD (degenerative disc disease), lumbosacral   . DVT (deep venous thrombosis) (HCC) 07/30/2013  . Dyspnea    occasionally  . Dysrhythmia   . GERD (gastroesophageal reflux disease)   . History of cardiac murmur as a child   . History of DVT of lower extremity    left lower extremity in 1980s, fell when bowling  . History of pulmonary embolus (PE) 1993   per  pt left lung post op 2 wks cholecystectomy  . History of rheumatic fever as a child    per last echo 12-31-2019 no valvular issues  . History of TIA (transient ischemic attack)    2014 and 2018 or 2019,  per pt no residual  . HTN (hypertension)    followed by pcp  . Hyperlipidemia   . Hypothyroidism   . IDA (iron deficiency anemia)   . Macular degeneration of both eyes   . Mild obstructive sleep apnea    per pt dx 2017 tried to uses cpap but intolerant  . Mixed incontinence urge and stress    urologist--- dr Saddie Benders  . OA (osteoarthritis) 07/06/2018  . Osteoporosis   . PAF (paroxysmal atrial fibrillation) (HCC) 10/01/2014   cardiologist--- dr Lavona Mound tobb;   cardiac cath 02-18-2013 normal coronaries arteries, ef 50%, cath done since echo showed ef 30-35%; nucleat stress study 04/ 2020 normal , normal echo 04/ 2021,  event monitor 09-14-2020 rare ST/AT variable block  . Pneumonia   . PONV (postoperative nausea and vomiting)   . Protein S deficiency (HCC)    followed by hemotology/ oncology-- dr d. Melvyn Neth ( [facility]) dx 1980s;  prior DVT left lower leg 1980s and left lung PE 1993; chronic  coumadin since 1980s  . PVC's (premature ventricular contractions)    followed by cardiology  . S/P cardiac catheterization 02/2013   Normal coronaries; low normal EF at 50%  . Solitary pulmonary nodule on lung CT 02/06/2019   First noted 01/13/2014 > no change as of 12/21/2018  . Spondylolisthesis, lumbar region 08/09/2018  . Stroke [facility])    TIA in 2018 or 2019  . Transient ischemic attack 07/30/2013  . Type 2 diabetes mellitus treated with insulin (HCC)    endocrilogist--- whitney reardon NP     (03-10-2021  pt continuously checks blood sugar throughout the day w/ Libre, fasting sugar --- 69--200)  . Wears  glasses   . Wears hearing aid in both ears    Past Surgical History:  Procedure Laterality Date  . ABDOMINAL HYSTERECTOMY    . BLEPHAROPLASTY    . CARDIAC CATHETERIZATION   02/18/2013   @ [facility]  by Dr [place];  normal coronaries w/ preserved LVF, ef 50%;   previous cath 2001 normal ef 65%  . CARPAL TUNNEL RELEASE Bilateral 1994  . CATARACT EXTRACTION W/ INTRAOCULAR LENS IMPLANT Bilateral 2017  . CHOLECYSTECTOMY    . CHOLECYSTECTOMY, LAPAROSCOPIC  1993  . COLONOSCOPY    . EAR BIOPSY Left   . FINGER SURGERY Left 2018   thumb  . FOOT SURGERY    . FOOT TENDON SURGERY Right    early 2000s  . FRACTURE SURGERY Left 02/13/2020   left femur  . HAND SURGERY Right 04/2022  . HARDWARE REMOVAL Left 03/12/2021   Procedure: HARDWARE REMOVAL;  Surgeon: Yolonda Kida, MD;  Location: [facility];  Service: Orthopedics;  Laterality: Left;  60 MINS  . JOINT REPLACEMENT    . LUMBAR LAMINECTOMY/DECOMPRESSION MICRODISCECTOMY N/A 12/22/2022   Procedure: Lumbar Two-Lumbar Three, Lumbar Three-Lumbar Four Lumbar Decompression;  Surgeon: Barnett Abu, MD;  Location: [facility] OR;  Service: Neurosurgery;  Laterality: N/A;  RM 20 3C  . ORIF ANKLE FRACTURE Left 02/26/2020   Procedure: OPEN REDUCTION INTERNAL FIXATION (ORIF) ANKLE FRACTURE;  Surgeon: Yolonda Kida, MD;  Location[facility] OR;  Service: Orthopedics;  Laterality: Left;  90 mins  . TONSILLECTOMY AND ADENOIDECTOMY Bilateral   . TOTAL KNEE ARTHROPLASTY Left 03/2005  . TOTAL VAGINAL HYSTERECTOMY  1988   per pt still has ovaries    Family History  Problem Relation Age of Onset  . Cirrhosis Mother   . Antithrombin III deficiency Mother        multiple emboli  . Ulcerative colitis Mother   . Diabetes Father   . Coronary artery disease Father   . Heart attack Father   . Hypertension Father   . Kidney disease Father   . Hypertension Sister   . Diabetes Sister   . Heart attack Sister        age 30   . Protein S deficiency Daughter   . Heart attack Brother   . Hypertension Brother   . Lung cancer Brother   . Stroke Neg Hx   . Colitis Neg Hx   . Colon polyps Neg Hx   . Esophageal cancer Neg Hx   .  Liver cancer Neg Hx   . Pancreatic cancer Neg Hx   . Rectal cancer Neg Hx   . Stomach cancer Neg Hx   . Breast cancer Neg Hx    Social History   Socioeconomic History  . Marital status: Married    Spouse name: Jomarie Longs  . Number of children: Not on file  . Years of education: Not on file  . Highest education level: Some college, no degree  Occupational History  . Not on file  Tobacco Use  . Smoking status: Never  . Smokeless tobacco: Never  Vaping Use  . Vaping status: Never Used  Substance and Sexual Activity  . Alcohol use: No  . Drug use: Never  . Sexual activity: Not on file    Comment: Hysterectomy  Other Topics Concern  . Not on file  Social History Narrative   Lives with spouse in [place].  2 grown daughters.   Retired Print production planner     Social Drivers of Dispensing optician  Resource Strain: Low Risk  (10/22/2023)   Overall Financial Resource Strain (CARDIA)   . Difficulty of Paying Living Expenses: Not very hard  Food Insecurity: No Food Insecurity (10/22/2023)   Hunger Vital Sign   . Worried About Programme researcher, broadcasting/film/video in the Last Year: Never true   . Ran Out of Food in the Last Year: Never true  Transportation Needs: No Transportation Needs (10/22/2023)   PRAPARE - Transportation   . Lack of Transportation (Medical): No   . Lack of Transportation (Non-Medical): No  Physical Activity: Inactive (10/22/2023)   Exercise Vital Sign   . Days of Exercise per Week: 0 days   . Minutes of Exercise per Session: 30 min  Stress: No Stress Concern Present (10/22/2023)   [employer] of Occupational Health - Occupational Stress Questionnaire   . Feeling of Stress : Only a little  Social Connections: Socially Integrated (10/22/2023)   Social Connection and Isolation Panel [NHANES]   . Frequency of Communication with Friends and Family: More than three times a week   . Frequency of Social Gatherings with Friends and Family: Patient declined   . Attends Religious Services:  More than 4 times per year   . Active Member of Clubs or Organizations: Patient declined   . Attends Banker Meetings: More than 4 times per year   . Marital Status: Married    Objective:  BP 138/64   Pulse 78   Temp (!) 97.1 F (36.2 C)   Ht 5' 1.5" (1.562 m)   Wt 142 lb (64.4 kg)   SpO2 96%   BMI 26.40 kg/m      10/31/2023    3:38 PM 10/05/2023    9:29 AM 09/27/2023    1:15 PM  BP/Weight  Systolic BP 138 122 155  Diastolic BP 64 80 78  Wt. (Lbs) 142 147   BMI 26.4 kg/m2 28.71 kg/m2     Physical Exam Vitals reviewed.  Constitutional:      Appearance: Normal appearance. She is normal weight.  Neck:     Vascular: No carotid bruit.  Cardiovascular:     Rate and Rhythm: Normal rate and regular rhythm.     Heart sounds: Normal heart sounds.  Pulmonary:     Effort: Pulmonary effort is normal. No respiratory distress.     Breath sounds: Normal breath sounds.  Abdominal:     General: Abdomen is flat. Bowel sounds are normal.     Palpations: Abdomen is soft.     Tenderness: There is no abdominal tenderness.  Neurological:     Mental Status: She is alert and oriented to person, place, and time.  Psychiatric:        Mood and Affect: Mood normal.        Behavior: Behavior normal.    Diabetic Foot Exam - Simple   Simple Foot Form  10/31/2023  2:22 PM  Visual Inspection No deformities, no ulcerations, no other skin breakdown bilaterally: Yes Sensation Testing Intact to touch and monofilament testing bilaterally: Yes Pulse Check Posterior Tibialis and Dorsalis pulse intact bilaterally: Yes Comments      Lab Results  Component Value Date   WBC 7.6 10/24/2023   HGB 13.6 10/24/2023   HCT 41.5 10/24/2023   PLT 289 10/24/2023   GLUCOSE 163 (H) 10/24/2023   CHOL 136 10/24/2023   TRIG 61 10/24/2023   HDL 71 10/24/2023   LDLCALC 52 10/24/2023   ALT 26 10/24/2023   AST 27 10/24/2023  NA 135 10/24/2023   K 5.3 (H) 10/24/2023   CL 96 10/24/2023    CREATININE 1.08 (H) 10/24/2023   BUN 24 10/24/2023   CO2 25 10/24/2023   TSH 1.97 09/27/2023   INR 2.2 07/24/2023   HGBA1C 7.1 (H) 10/24/2023   MICROALBUR 10 06/15/2021      Assessment & Plan:    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  No changes to medicines. Continue  Lisinopril 20 mg 2 daily, Diltiazem 180 mg daily, Aspirin 81 mg daily, Isosorbide 30 mg daily, hydrochlorothiazide 12.5 mg daily.  Continue to work on eating a healthy diet and exercise.  Labs reviewed today.     Gastroesophageal reflux disease without esophagitis Assessment & Plan: Well-controlled.  Continue protonix 40 mg daily.     Chronic constipation Assessment & Plan: The current medical regimen is effective;  continue present plan and medications. Continue miralax and sennakot.    Type 2 diabetes mellitus with diabetic neuropathy, with long-term current use of insulin (HCC) Assessment & Plan: Control: fair. Sees endocrinology.  Recommend check sugars before meals and before bed. Has CGM. Recommend check feet daily. Recommend annual eye exams. Medicines: Farxiga 10 mg daily, NovoLog 4 units before meals, trulicity 0.75 weekly  Continue to work on eating a healthy diet and exercise.  Labs reviewed today    Mixed hyperlipidemia Assessment & Plan: Well controlled.  No changes to medicines. Continue atorvastatin 80 mg before bed.  Continue to work on eating a healthy diet and exercise.  Labs reviewed   Protein S deficiency [facility]) Assessment & Plan: Continue eliquis.   CKD stage 3a, GFR 45-59 ml/min (HCC) Assessment & Plan: Stable.       No orders of the defined types were placed in this encounter.   No orders of the defined types were placed in this encounter.    Follow-up: Return in about 3 months (around 01/28/2024) for chronic follow up.   I,Marla I Leal-Borjas,acting as a scribe for Blane Ohara, MD.,have  documented all relevant documentation on the behalf of Blane Ohara, MD,as directed by  Blane Ohara, MD while in the presence of Blane Ohara, MD.   An After Visit Summary was printed and given to the patient.  I attest that I have reviewed this visit and agree with the plan scribed by my staff.   Blane Ohara, MD [facility] [phone]

## 2023-10-31 NOTE — Patient Instructions (Signed)
 Consider omnipod to decrease shots due to easy bruising due to blood thinner.

## 2023-11-02 ENCOUNTER — Telehealth: Payer: Self-pay

## 2023-11-02 NOTE — Telephone Encounter (Signed)
 Pt called to ask if she can now eat cabbage and turnip greens since off coumadin?

## 2023-11-03 ENCOUNTER — Other Ambulatory Visit: Payer: Self-pay | Admitting: Family Medicine

## 2023-11-03 DIAGNOSIS — S93401A Sprain of unspecified ligament of right ankle, initial encounter: Secondary | ICD-10-CM | POA: Diagnosis not present

## 2023-11-03 DIAGNOSIS — K219 Gastro-esophageal reflux disease without esophagitis: Secondary | ICD-10-CM

## 2023-11-03 NOTE — Telephone Encounter (Signed)
 Done

## 2023-11-04 NOTE — Assessment & Plan Note (Signed)
 The current medical regimen is effective;  continue present plan and medications.

## 2023-11-04 NOTE — Assessment & Plan Note (Signed)
Well controlled.  Continue protonix 40 mg daily.

## 2023-11-04 NOTE — Assessment & Plan Note (Signed)
 Control: fair. Sees endocrinology.  Recommend check sugars before meals and before bed. Has CGM. Recommend check feet daily. Recommend annual eye exams. Medicines: Farxiga 10 mg daily, Toujeo 6 units daily.   NovoLog 4 unites for breakfast and lunch, 5 units supper, trulicity 0.75 weekly  Continue to work on eating a healthy diet and exercise.  Labs reviewed today

## 2023-11-04 NOTE — Assessment & Plan Note (Signed)
Well controlled.  No changes to medicines. Continue  Lisinopril 20 mg 2 daily, Amlodipine 5 mg daily, Aspirin 81 mg daily, Isosorbide 30 mg daily. Continue to work on eating a healthy diet and exercise.  Labs reviewed today

## 2023-11-04 NOTE — Assessment & Plan Note (Signed)
Well controlled.  No changes to medicines. Continue atorvastatin 80 mg before bed.  Continue to work on eating a healthy diet and exercise.  Labs reviewed

## 2023-11-05 DIAGNOSIS — N1831 Chronic kidney disease, stage 3a: Secondary | ICD-10-CM

## 2023-11-05 HISTORY — DX: Chronic kidney disease, stage 3a: N18.31

## 2023-11-05 NOTE — Assessment & Plan Note (Signed)
 Rate controlled. Continue eliquis

## 2023-11-05 NOTE — Assessment & Plan Note (Signed)
 Continue eliquis  ?

## 2023-11-05 NOTE — Assessment & Plan Note (Signed)
 Stable

## 2023-11-06 ENCOUNTER — Other Ambulatory Visit: Payer: Self-pay | Admitting: Oncology

## 2023-11-06 ENCOUNTER — Telehealth: Payer: Self-pay

## 2023-11-06 NOTE — Telephone Encounter (Signed)
 Patient was advised to stop her Trulicity and Marcelline Deist due to getting a injection in her back tomorrow from pain management.  She wants to know if insulin should be adjusted as well

## 2023-11-07 DIAGNOSIS — N390 Urinary tract infection, site not specified: Secondary | ICD-10-CM | POA: Diagnosis not present

## 2023-11-07 DIAGNOSIS — E119 Type 2 diabetes mellitus without complications: Secondary | ICD-10-CM | POA: Diagnosis not present

## 2023-11-07 DIAGNOSIS — R809 Proteinuria, unspecified: Secondary | ICD-10-CM | POA: Diagnosis not present

## 2023-11-07 DIAGNOSIS — E875 Hyperkalemia: Secondary | ICD-10-CM | POA: Diagnosis not present

## 2023-11-07 DIAGNOSIS — N183 Chronic kidney disease, stage 3 unspecified: Secondary | ICD-10-CM | POA: Diagnosis not present

## 2023-11-07 DIAGNOSIS — I10 Essential (primary) hypertension: Secondary | ICD-10-CM | POA: Diagnosis not present

## 2023-11-08 ENCOUNTER — Other Ambulatory Visit (HOSPITAL_COMMUNITY): Payer: Self-pay

## 2023-11-08 ENCOUNTER — Telehealth: Payer: Self-pay | Admitting: Pharmacy Technician

## 2023-11-08 NOTE — Telephone Encounter (Signed)
 Pharmacy Patient Advocate Encounter   Received notification from Fax that prior authorization for Atorvastatin is required/requested.   Insurance verification completed.   The patient is insured through [employer] .   Per test claim: Refill too soon. PA is not needed at this time. Medication was filled 11/06/23. Next eligible fill date is 01/20/24.   [facility] fax just needs updated diagnosis code on the prescription but no PA needed

## 2023-11-08 NOTE — Telephone Encounter (Signed)
 Patient aware.

## 2023-11-09 DIAGNOSIS — M5116 Intervertebral disc disorders with radiculopathy, lumbar region: Secondary | ICD-10-CM | POA: Diagnosis not present

## 2023-11-09 DIAGNOSIS — M5416 Radiculopathy, lumbar region: Secondary | ICD-10-CM | POA: Diagnosis not present

## 2023-11-20 ENCOUNTER — Telehealth: Payer: Self-pay

## 2023-11-20 ENCOUNTER — Encounter: Payer: Self-pay | Admitting: Internal Medicine

## 2023-11-20 DIAGNOSIS — E1140 Type 2 diabetes mellitus with diabetic neuropathy, unspecified: Secondary | ICD-10-CM | POA: Diagnosis not present

## 2023-11-20 NOTE — Telephone Encounter (Signed)
 Patient states that since she had back injections 2 weeks ago her sugar are not controlled.  I have printed the [facility] report.   Trulicity 0.75mg  Farxiga 10mg  Novolog 4 units with lunch and supper plus correction scale

## 2023-11-20 NOTE — Telephone Encounter (Signed)
 Patient advise and medication changes sent through mychart per patient request

## 2023-11-27 ENCOUNTER — Ambulatory Visit (INDEPENDENT_AMBULATORY_CARE_PROVIDER_SITE_OTHER)

## 2023-11-27 VITALS — BP 100/54 | HR 56 | Temp 97.0°F | Resp 14 | Ht 61.5 in | Wt 143.0 lb

## 2023-11-27 DIAGNOSIS — R300 Dysuria: Secondary | ICD-10-CM | POA: Diagnosis not present

## 2023-11-27 DIAGNOSIS — N3001 Acute cystitis with hematuria: Secondary | ICD-10-CM | POA: Diagnosis not present

## 2023-11-27 DIAGNOSIS — N183 Chronic kidney disease, stage 3 unspecified: Secondary | ICD-10-CM | POA: Diagnosis not present

## 2023-11-27 LAB — POCT URINALYSIS DIP (CLINITEK)
Bilirubin, UA: NEGATIVE
Glucose, UA: >=1000 mg/dL — AB
Ketones, POC UA: NEGATIVE mg/dL
Nitrite, UA: NEGATIVE
Spec Grav, UA: 1.015 (ref 1.010–1.025)
Urobilinogen, UA: 0.2 U/dL
pH, UA: 6 (ref 5.0–8.0)

## 2023-11-27 MED ORDER — CEFTRIAXONE SODIUM 1 G IJ SOLR
1.0000 g | Freq: Once | INTRAMUSCULAR | Status: AC
Start: 2023-11-27 — End: 2023-11-27
  Administered 2023-11-27: 1 g via INTRAMUSCULAR

## 2023-11-27 MED ORDER — CEPHALEXIN 500 MG PO CAPS: 500.0000 mg | ORAL_CAPSULE | Freq: Two times a day (BID) | ORAL | 0 refills | Status: AC

## 2023-11-27 NOTE — Progress Notes (Signed)
 Acute Office Visit  Subjective:    Patient ID: Pamela [name], female    DOB: 02/03/1946, 78 y.o.   MRN: [id number]  Chief Complaint  Patient presents with   Urinary Tract Infection    Discussed the use of AI scribe software for clinical note transcription with the patient, who gave verbal consent to proceed.       HPI: Patient is in today for a problem visit.  She complains of right rib pain radiating to right flank starting Saturday night. Denies fever Some dysuria Increased frequency Several BP and blood sugar elevations in the past couple weeks after she got her back injection on 11/09/23  Past Medical History:  Diagnosis Date   Anticoagulated on Coumadin    chronic--- managed by hematology/ oncology,   Asthma, mild intermittent    followed by pcp--- per pt last exacerbation winter 2021 w/ acute bronchitis   Bilateral leg cramps    Blood dyscrasia    Chronic constipation    CKD (chronic kidney disease), stage III (HCC)    Closed bimalleolar fracture of left ankle 02/26/2020   DDD (degenerative disc disease), cervical    w/ spondylosis,  per pt last steroid injection 06/ 2022   DDD (degenerative disc disease), lumbosacral    DVT (deep venous thrombosis) (HCC) 07/30/2013   Dyspnea    occasionally   Dysrhythmia    GERD (gastroesophageal reflux disease)    History of cardiac murmur as a child    History of DVT of lower extremity    left lower extremity in 1980s, fell when bowling   History of pulmonary embolus (PE) 1993   per pt left lung post op 2 wks cholecystectomy   History of rheumatic fever as a child    per last echo 12-31-2019 no valvular issues   History of TIA (transient ischemic attack)    2014 and 2018 or 2019,  per pt no residual   HTN (hypertension)    followed by pcp   Hyperlipidemia    Hypothyroidism    IDA (iron deficiency anemia)    Macular degeneration of both eyes    Mild obstructive sleep apnea    per pt dx 2017 tried to  uses cpap but intolerant   Mixed incontinence urge and stress    urologist--- dr Saddie Benders   OA (osteoarthritis) 07/06/2018   Osteoporosis    PAF (paroxysmal atrial fibrillation) (HCC) 10/01/2014   cardiologist--- dr Lavona Mound tobb;   cardiac cath 02-18-2013 normal coronaries arteries, ef 50%, cath done since echo showed ef 30-35%; nucleat stress study 04/ 2020 normal , normal echo 04/ 2021,  event monitor 09-14-2020 rare ST/AT variable block   Pneumonia    PONV (postoperative nausea and vomiting)    Protein S deficiency (HCC)    followed by hemotology/ oncology-- dr d. Melvyn Neth ([facility]) dx 1980s;  prior DVT left lower leg 1980s and left lung PE 1993; chronic  coumadin since 1980s   PVC's (premature ventricular contractions)    followed by cardiology   S/P cardiac catheterization 02/2013   Normal coronaries; low normal EF at 50%   Solitary pulmonary nodule on lung CT 02/06/2019   First noted 01/13/2014 > no change as of 12/21/2018   Spondylolisthesis, lumbar region 08/09/2018   Stroke (HCC)    TIA in 2018 or 2019   Transient ischemic attack 07/30/2013   Type 2 diabetes mellitus treated with insulin (HCC)    endocrilogist--- whitney reardon NP     (  03-10-2021  pt continuously checks blood sugar throughout the day w/ Libre, fasting sugar --- 69--200)   Wears glasses    Wears hearing aid in both ears     Past Surgical History:  Procedure Laterality Date   ABDOMINAL HYSTERECTOMY     BLEPHAROPLASTY     CARDIAC CATHETERIZATION  02/18/2013   @ [facility]  by Dr Sw[place];  normal coronaries w/ preserved LVF, ef 50%;   previous cath 2001 normal ef 65%   CARPAL TUNNEL RELEASE Bilateral 1994   CATARACT EXTRACTION W/ INTRAOCULAR LENS IMPLANT Bilateral 2017   CHOLECYSTECTOMY     CHOLECYSTECTOMY, LAPAROSCOPIC  1993   COLONOSCOPY     EAR BIOPSY Left    FINGER SURGERY Left 2018   thumb   FOOT SURGERY     FOOT TENDON SURGERY Right    early 2000s   FRACTURE SURGERY Left 02/13/2020   left  femur   HAND SURGERY Right 04/2022   HARDWARE REMOVAL Left 03/12/2021   Procedure: HARDWARE REMOVAL;  Surgeon: Yolonda Kida, MD;  Location: [facility];  Service: Orthopedics;  Laterality: Left;  60 MINS   JOINT REPLACEMENT     LUMBAR LAMINECTOMY/DECOMPRESSION MICRODISCECTOMY N/A 12/22/2022   Procedure: Lumbar Two-Lumbar Three, Lumbar Three-Lumbar Four Lumbar Decompression;  Surgeon: Barnett Abu, MD;  Location: [facility] OR;  Service: Neurosurgery;  Laterality: N/A;  RM 20 3C   ORIF ANKLE FRACTURE Left 02/26/2020   Procedure: OPEN REDUCTION INTERNAL FIXATION (ORIF) ANKLE FRACTURE;  Surgeon: Yolonda Kida, MD;  Location: [facility];  Service: Orthopedics;  Laterality: Left;  90 mins   TONSILLECTOMY AND ADENOIDECTOMY Bilateral    TOTAL KNEE ARTHROPLASTY Left 03/2005   TOTAL VAGINAL HYSTERECTOMY  1988   per pt still has ovaries    Family History  Problem Relation Age of Onset   Cirrhosis Mother    Antithrombin III deficiency Mother        multiple emboli   Ulcerative colitis Mother    Diabetes Father    Coronary artery disease Father    Heart attack Father    Hypertension Father    Kidney disease Father    Hypertension Sister    Diabetes Sister    Heart attack Sister        age 84    Protein S deficiency Daughter    Heart attack Brother    Hypertension Brother    Lung cancer Brother    Stroke Neg Hx    Colitis Neg Hx    Colon polyps Neg Hx    Esophageal cancer Neg Hx    Liver cancer Neg Hx    Pancreatic cancer Neg Hx    Rectal cancer Neg Hx    Stomach cancer Neg Hx    Breast cancer Neg Hx     Social History   Socioeconomic History   Marital status: Married    Spouse name: Jomarie Longs   Number of children: Not on file   Years of education: Not on file   Highest education level: Some college, no degree  Occupational History   Not on file  Tobacco Use   Smoking status: Never   Smokeless tobacco: Never  Vaping Use   Vaping status: Never Used   Substance and Sexual Activity   Alcohol use: No   Drug use: Never   Sexual activity: Not on file    Comment: Hysterectomy  Other Topics Concern   Not on file  Social History Narrative   Lives with spouse in  [place].  2 grown daughters.   Retired Print production planner     Social Drivers of Corporate investment banker Strain: Low Risk  (10/22/2023)   Overall Financial Resource Strain (CARDIA)    Difficulty of Paying Living Expenses: Not very hard  Food Insecurity: No Food Insecurity (10/22/2023)   Hunger Vital Sign    Worried About Running Out of Food in the Last Year: Never true    Ran Out of Food in the Last Year: Never true  Transportation Needs: No Transportation Needs (10/22/2023)   PRAPARE - Administrator, Civil Service (Medical): No    Lack of Transportation (Non-Medical): No  Physical Activity: Inactive (10/22/2023)   Exercise Vital Sign    Days of Exercise per Week: 0 days    Minutes of Exercise per Session: 30 min  Stress: No Stress Concern Present (10/22/2023)   [employer] of Occupational Health - Occupational Stress Questionnaire    Feeling of Stress : Only a little  Social Connections: Socially Integrated (10/22/2023)   Social Connection and Isolation Panel [NHANES]    Frequency of Communication with Friends and Family: More than three times a week    Frequency of Social Gatherings with Friends and Family: Patient declined    Attends Religious Services: More than 4 times per year    Active Member of [employer] or Organizations: Patient declined    Attends Engineer, structural: More than 4 times per year    Marital Status: Married  Catering manager Violence: Not At Risk (03/30/2023)   Humiliation, Afraid, Rape, and Kick questionnaire    Fear of Current or Ex-Partner: No    Emotionally Abused: No    Physically Abused: No    Sexually Abused: No    Outpatient Medications Prior to Visit  Medication Sig Dispense Refill   acetaminophen (TYLENOL) 650 MG  CR tablet Take 650-1,300 mg by mouth every 8 (eight) hours as needed for pain.     albuterol (VENTOLIN HFA) 108 (90 Base) MCG/ACT inhaler INHALE 1 TO 2 PUFFS INTO THE LUNGS EVERY 6 HOURS AS NEEDED FOR WHEEZING OR SHORTNESS OF BREATH 6.7 g 3   apixaban (ELIQUIS) 5 MG TABS tablet Take 1 tablet (5 mg total) by mouth 2 (two) times daily. 60 tablet 2   aspirin EC (ASPIRIN LOW DOSE) 81 MG tablet TAKE 1 TABLET(81 MG) BY MOUTH DAILY 90 tablet 3   atorvastatin (LIPITOR) 80 MG tablet TAKE 1 TABLET(80 MG) BY MOUTH DAILY 90 tablet 3   carboxymethylcellulose (REFRESH PLUS) 0.5 % SOLN Place 1 drop into both eyes 3 (three) times daily as needed (dry eyes).     Cholecalciferol (VITAMIN D3) 50 MCG (2000 UT) TABS Take 4,000 Units by mouth daily with lunch.     Continuous Glucose Sensor (FREESTYLE LIBRE 2 SENSOR) MISC 2 each by Does not apply route every 14 (fourteen) days. E11.40 6 each 3   cyanocobalamin (VITAMIN B12) 1000 MCG tablet Take 1,000 mcg by mouth daily.     dapagliflozin propanediol (FARXIGA) 10 MG TABS tablet Take 1 tablet (10 mg total) by mouth daily before breakfast. 90 tablet 3   diltiazem (CARDIZEM CD) 180 MG 24 hr capsule Take 1 capsule (180 mg total) by mouth daily. 90 capsule 3   Dulaglutide (TRULICITY) 0.75 MG/0.5ML SOAJ Inject 0.75 mg into the skin once a week. 6 mL 3   estradiol (ESTRACE) 0.1 MG/GM vaginal cream Place 1 Applicatorful vaginally 2 (two) times a week. As needed  ferrous sulfate 325 (65 FE) MG tablet Take 325 mg by mouth daily with lunch.     GEMTESA 75 MG TABS Take 75 mg by mouth at bedtime.     hydrochlorothiazide (HYDRODIURIL) 12.5 MG tablet Take 12.5 mg by mouth daily.     insulin aspart (NOVOLOG FLEXPEN) 100 UNIT/ML FlexPen Max daily 30 units 30 mL 3   Insulin Pen Needle (BD PEN NEEDLE NANO 2ND GEN) 32G X 4 MM MISC 1 Device by Other route in the morning, at noon, in the evening, and at bedtime. USE TO INJECT INSULIN four times DAILY 400 each 3   isosorbide mononitrate  (IMDUR) 30 MG 24 hr tablet TAKE 1 TABLET BY MOUTH DAILY. PLEASE ARRANGE APPT FOR FURTHER REFILLS 90 tablet 3   lisinopril (ZESTRIL) 20 MG tablet TAKE 1 TABLETS BY MOUTH TWICE DAILY 180 tablet 3   magnesium oxide (MAG-OX) 400 (240 Mg) MG tablet TAKE 2 TABLETS(800 MG) BY MOUTH TWICE DAILY (Patient taking differently: Take 800 mg by mouth 2 (two) times daily.) 120 tablet 2   Multiple Vitamins-Minerals (PRESERVISION AREDS PO) Take 1 capsule by mouth 2 (two) times daily.     nystatin cream (MYCOSTATIN) Apply 1 Application topically 4 (four) times daily as needed for dry skin (yeast infection).     ondansetron (ZOFRAN-ODT) 8 MG disintegrating tablet Take 8 mg by mouth every 8 (eight) hours as needed for nausea or vomiting.      pantoprazole (PROTONIX) 40 MG tablet TAKE 1 TABLET(40 MG) BY MOUTH EVERY MORNING 90 tablet 1   polyethylene glycol powder (MIRALAX) 17 GM/SCOOP powder Take 17 g by mouth daily as needed for moderate constipation.     pyridoxine (B-6) 100 MG tablet Take 100 mg by mouth daily.     sennosides-docusate sodium (SENOKOT-S) 8.6-50 MG tablet Take 1 tablet by mouth daily.     sodium chloride (OCEAN) 0.65 % SOLN nasal spray Place 1 spray into both nostrils as needed for congestion.     zolpidem (AMBIEN) 5 MG tablet TAKE 1 TABLET(5 MG) BY MOUTH AT BEDTIME AS NEEDED FOR SLEEP 30 tablet 5   methocarbamol (ROBAXIN) 500 MG tablet Take 500 mg by mouth every 6 (six) hours as needed.     No facility-administered medications prior to visit.    Allergies  Allergen Reactions   Ketek [Telithromycin] Nausea And Vomiting and Rash   Loxapine Succinate Hives   Macrodantin [Nitrofurantoin Macrocrystal] Other (See Comments)    Pulmonary fibrosis   Naproxen Sodium Anaphylaxis and Hives   Amoxapine And Related Hives   Darvon Nausea And Vomiting   Belsomra [Suvorexant] Other (See Comments)    "Nightmares"   Cymbalta [Duloxetine Hcl] Other (See Comments)    "Blackouts"   Duloxetine    Gabapentin  Other (See Comments)    Blurry vision   Lyrica [Pregabalin]     Makes her sedated   Ozempic (0.25 Or 0.5 Mg-Dose) [Semaglutide(0.25 Or 0.5mg -Dos)] Diarrhea   Semaglutide Diarrhea    Rybelsus   Amoxicillin Rash   Propoxyphene Nausea And Vomiting    Review of Systems  Constitutional:  Negative for chills, fatigue and fever.  HENT:  Negative for congestion, ear pain and sore throat.   Respiratory:  Negative for cough and shortness of breath.   Cardiovascular:  Negative for chest pain and palpitations.  Gastrointestinal:  Positive for nausea. Negative for abdominal pain, constipation, diarrhea and vomiting.  Endocrine: Negative for polydipsia, polyphagia and polyuria.  Genitourinary:  Positive for dysuria, flank pain and  frequency. Negative for difficulty urinating.  Musculoskeletal:  Negative for arthralgias, back pain and myalgias.       Flank pain  Skin:  Negative for rash.  Neurological:  Negative for headaches.  Psychiatric/Behavioral:  Negative for dysphoric mood. The patient is not nervous/anxious.        Objective:        11/27/2023    9:28 AM 11/27/2023    9:17 AM 10/31/2023    3:38 PM  Vitals with BMI  Height  5' 1.5" 5' 1.5"  Weight  143 lbs 142 lbs  BMI  26.59 26.4  Systolic  100 138  Diastolic  54 64  Pulse 56 38 78    No data found.   Physical Exam Vitals and nursing note reviewed.  Constitutional:      Appearance: Normal appearance.  HENT:     Head: Normocephalic and atraumatic.     Mouth/Throat:     Mouth: Mucous membranes are dry.  Cardiovascular:     Rate and Rhythm: Normal rate and regular rhythm.  Pulmonary:     Effort: Pulmonary effort is normal.     Breath sounds: Normal breath sounds.  Abdominal:     Tenderness: There is abdominal tenderness (suprapubic tenderness). There is right CVA tenderness.     Comments: Mild RLQ tenderness   Skin:    General: Skin is warm.  Neurological:     General: No focal deficit present.     Mental Status:  She is alert.  Psychiatric:        Mood and Affect: Mood normal.     Health Maintenance Due  Topic Date Due   Zoster Vaccines- Shingrix (1 of 2) 09/18/1963   DTaP/Tdap/Td (2 - Td or Tdap) 11/15/2023    There are no preventive care reminders to display for this patient.   Lab Results  Component Value Date   TSH 1.97 09/27/2023   Lab Results  Component Value Date   WBC 7.6 10/24/2023   HGB 13.6 10/24/2023   HCT 41.5 10/24/2023   MCV 94 10/24/2023   PLT 289 10/24/2023   Lab Results  Component Value Date   NA 135 10/24/2023   K 5.3 (H) 10/24/2023   CO2 25 10/24/2023   GLUCOSE 163 (H) 10/24/2023   BUN 24 10/24/2023   CREATININE 1.08 (H) 10/24/2023   BILITOT 0.5 10/24/2023   ALKPHOS 170 (H) 10/24/2023   AST 27 10/24/2023   ALT 26 10/24/2023   PROT 6.8 10/24/2023   ALBUMIN 4.3 10/24/2023   CALCIUM 9.7 10/24/2023   ANIONGAP 12 06/06/2023   EGFR 52 (L) 10/24/2023   GFR 41.53 (L) 09/27/2023   Lab Results  Component Value Date   CHOL 136 10/24/2023   Lab Results  Component Value Date   HDL 71 10/24/2023   Lab Results  Component Value Date   LDLCALC 52 10/24/2023   Lab Results  Component Value Date   TRIG 61 10/24/2023   Lab Results  Component Value Date   CHOLHDL 1.9 10/24/2023   Lab Results  Component Value Date   HGBA1C 7.1 (H) 10/24/2023       Assessment & Plan:  Dysuria Assessment & Plan: Patient comes in with complaints of dysuria, frequency, hesitancy, right upper abdominal and flank pain for couple days  UA in office showed excessive glucose, increased specific gravity, blood, leukocytes. Negative nitrite  Exam positive for right flank tenderness and suprapublc tenderness  PLAN: though she did not appear septic, there is  some suspicion for pyelonephritis. - one shot of 1 gm IM Rocephin given in office today,  -sent script for KEFLEX 500 MG TWICE DAILY FOR 7 DAYS as she is not anaphylactic to amoxicillin. - will obtain another urine  sample to send for culture as the initial sample was not enough. - encourage hydration,  - Report back if any worsening symptoms or call 911 for any severe symptoms    Since she said she had blood work done this morning through her nephrologist, did not repeat labs. Her blood sugar spikes could be acute infection or could be due to her recent steroid injection. Advised to monitor and Report back if persistently elevated   Orders: -     POCT URINALYSIS DIP (CLINITEK)  Acute cystitis with hematuria -     Urine Culture -     cefTRIAXone Sodium  Other orders -     Cephalexin; Take 1 capsule (500 mg total) by mouth 2 (two) times daily for 7 days.  Dispense: 14 capsule; Refill: 0     Assessment and Plan       Meds ordered this encounter  Medications   cephALEXin (KEFLEX) 500 MG capsule    Sig: Take 1 capsule (500 mg total) by mouth 2 (two) times daily for 7 days.    Dispense:  14 capsule    Refill:  0    Okay to dispense with amoxicillin allergy as it is only hives   cefTRIAXone (ROCEPHIN) injection 1 g    Orders Placed This Encounter  Procedures   Urine Culture   POCT URINALYSIS DIP (CLINITEK)     Follow-up: No follow-ups on file.  An After Visit Summary was printed and given to the patient.  Windell Moment, MD [facility] [phone]

## 2023-11-27 NOTE — Assessment & Plan Note (Addendum)
 Patient comes in with complaints of dysuria, frequency, hesitancy, right upper abdominal and flank pain for couple days  UA in office showed excessive glucose, increased specific gravity, blood, leukocytes. Negative nitrite  Exam positive for right flank tenderness and suprapublc tenderness  PLAN: though she did not appear septic, there is some suspicion for pyelonephritis. - one shot of 1 gm IM Rocephin given in office today,  -sent script for KEFLEX 500 MG TWICE DAILY FOR 7 DAYS as she is not anaphylactic to amoxicillin. - will obtain another urine sample to send for culture as the initial sample was not enough. - encourage hydration,  - Report back if any worsening symptoms or call 911 for any severe symptoms    Since she said she had blood work done this morning through her nephrologist, did not repeat labs. Her blood sugar spikes could be acute infection or could be due to her recent steroid injection. Advised to monitor and Report back if persistently elevated

## 2023-11-28 LAB — BASIC METABOLIC PANEL: EGFR: 45

## 2023-11-30 LAB — URINE CULTURE

## 2023-12-01 ENCOUNTER — Other Ambulatory Visit: Payer: Self-pay | Admitting: Hematology and Oncology

## 2023-12-01 ENCOUNTER — Encounter: Payer: Self-pay | Admitting: Cardiology

## 2023-12-01 ENCOUNTER — Ambulatory Visit: Payer: Medicare PPO | Attending: Cardiology | Admitting: Cardiology

## 2023-12-01 VITALS — BP 142/48 | HR 59 | Ht 61.0 in | Wt 147.0 lb

## 2023-12-01 DIAGNOSIS — I2089 Other forms of angina pectoris: Secondary | ICD-10-CM

## 2023-12-01 DIAGNOSIS — I2510 Atherosclerotic heart disease of native coronary artery without angina pectoris: Secondary | ICD-10-CM

## 2023-12-01 DIAGNOSIS — I25118 Atherosclerotic heart disease of native coronary artery with other forms of angina pectoris: Secondary | ICD-10-CM | POA: Diagnosis not present

## 2023-12-01 DIAGNOSIS — R0602 Shortness of breath: Secondary | ICD-10-CM | POA: Diagnosis not present

## 2023-12-01 DIAGNOSIS — I4729 Other ventricular tachycardia: Secondary | ICD-10-CM | POA: Diagnosis not present

## 2023-12-01 DIAGNOSIS — Z01810 Encounter for preprocedural cardiovascular examination: Secondary | ICD-10-CM | POA: Diagnosis not present

## 2023-12-01 DIAGNOSIS — Z01812 Encounter for preprocedural laboratory examination: Secondary | ICD-10-CM | POA: Diagnosis not present

## 2023-12-01 DIAGNOSIS — I493 Ventricular premature depolarization: Secondary | ICD-10-CM

## 2023-12-01 NOTE — Patient Instructions (Addendum)
 Medication Instructions:  Your physician recommends that you continue on your current medications as directed. Please refer to the Current Medication list given to you today.  *If you need a refill on your cardiac medications before your next appointment, please call your pharmacy*  Lab Work: CMET, Mag, CBC If you have labs (blood work) drawn today and your tests are completely normal, you will receive your results only by: MyChart Message (if you have MyChart) OR A paper copy in the mail If you have any lab test that is abnormal or we need to change your treatment, we will call you to review the results.  Testing/Procedures: You are scheduled for a Cardiac Catheterization on Thursday, April 3 with Dr. Verne Carrow.  1. Please arrive at the [facility] (Main Entrance A) at [facility]: [address] [place], [place] [address] at 11:00 AM   Free valet parking service is available. You will check in at ADMITTING. The support person will be asked to wait in the waiting room.  It is OK to have someone drop you off and come back when you are ready to be discharged.    Special note: Every effort is made to have your procedure done on time. Please understand that emergencies sometimes delay scheduled procedures.  2. Diet: Do not eat solid foods after midnight.  The patient may have clear liquids until 5am upon the day of the procedure.  3. Labs: You will need to have blood drawn on TODAY.  4. Medication instructions in preparation for your procedure:   Contrast Allergy: No   Stop taking Eliquis (Apixiban) on Tuesday, April 1.  Stop taking, Lisinopril (Zestril or Prinivil) and HTCZ (Hydrochlorothiazide) Wednesday, December 06, 2023.  Take only 1/2 dose of insulin the night before your procedure. Do not take any insulin on the day of the procedure.  On the morning of your procedure, take your Aspirin 81 mg and any morning medicines NOT listed above.  You may use sips of  water.  5. Plan to go home the same day, you will only stay overnight if medically necessary. 6. Bring a current list of your medications and current insurance cards. 7. You MUST have a responsible person to drive you home. 8. Someone MUST be with you the first 24 hours after you arrive home or your discharge will be delayed. 9. Please wear clothes that are easy to get on and off and wear slip-on shoes.  Thank you for allowing [place] to care for you!   -- [facility]  Pulmonary Function Tests Pulmonary function tests (PFTs) are breathing tests that are used to: Measure how well your lungs work. Find out what is causing your lung problems. Find the best treatment for you. You may have PFTs: If you have a condition that affects your lungs, such as asthma or chronic obstructive pulmonary disease (COPD). To watch for changes in your lung function over time if you have a long-term (chronic) lung disease. If you are an IT trainer. PFTs check the effects of being exposed to chemicals over a long period of time. To check lung function: Before having surgery or other procedures. If you smoke. To check if prescribed medicines or treatments are helping your lungs. Tell a health care provider about: Any allergies you have. All medicines you are taking, including inhaler or nebulizer medicines, vitamins, herbs, eye drops, creams, and over-the-counter medicines. Any bleeding problems you have. Any surgeries you have had, especially recent  surgery of the eye, abdomen, or chest. These can make PFTs difficult or unsafe. Any medical conditions you have, including chest pain or heart problems, tuberculosis, or respiratory infections such as pneumonia, a cold, or the flu. Any fear of being in closed spaces (claustrophobia). Some of your tests may be in a closed space. What are the risks? Your health care provider will talk with you about risks. These may  include: Feeling light-headed due to fast, deep breathing known as overbreathing or hyperventilation. An asthma attack from deep breathing. What happens before the test? Take over-the-counter and prescription medicines only as told by your health care provider. If you take inhaler or nebulizer medicines, ask your health care provider which medicines you should take on the day of your testing. Some inhaler medicines may interfere with PFTs if they are taken shortly before the tests. Follow instructions from your health care provider about what you may eat and drink. These may include: Avoiding eating large meals. Avoiding using caffeine before the testing. Not drinkingalcohol for up to 4 hours before the test. Do not use any products that contain nicotine or tobacco for up to 4 hours before your test. These products include cigarettes, chewing tobacco, and vaping devices, such as e-cigarettes. These can affect your test results. If you need help quitting, ask your health care provider. Wear comfortable clothing that will not get in the way of your breathing. Avoid exercise that takes a lot of effort (strenuous exercise) for at least 30 minutes before the test. What happens during the test?  You will be given: A soft noseclip to wear. This allows all of your breaths to go through your mouth instead of your nose. A germ-free (sterile) mouthpiece. It will be attached to a spirometer machine that measures your breathing. You will be asked to do breathing exercises. The exercises will be done by breathing in (inhaling) and breathing out (exhaling). You may have to repeat the exercises many times before the testing is complete. You will need to follow instructions exactly as told to get accurate results. Make sure to blow as hard and as fast as you can when you are told to do so. You may be given a medicine called a bronchodilator. This makes the small air passages in your lungs larger so you can breathe  easier. The tests will be repeated after the medicine takes effect. You will be watched for any problems, such as feeling faint or dizzy, or having trouble breathing. The procedure may vary among health care providers and hospitals. What can I expect after the test? Your results will be compared with the expected lung function of someone with healthy lungs who is similar to you in several ways. These ways include age, sex, height, weight, and race or ethnicity. This is done to show how your lung function compares with normal lung function (percent predicted). The percent predicted helps your health care provider know if your lung function is normal or not. If you have had PFTs done before, your health care provider will compare your current results with past results. This shows if your lung function is better, worse, or the same as before. It is up to you to get the results of your procedure. Ask your health care provider, or the department that is doing the procedure, when your results will be ready. After you get your results, talk with your health care provider about treatment options, if necessary. This information is not intended to replace advice given to  you by your health care provider. Make sure you discuss any questions you have with your health care provider. Document Revised: 03/14/2022 Document Reviewed: 03/14/2022 Elsevier Patient Education  2024 [employer] Follow-Up: At [facility], you and your health needs are our priority.  As part of our continuing mission to provide you with exceptional heart care, our providers are all part of one team.  This team includes your primary Cardiologist (physician) and Advanced Practice Providers or APPs (Physician Assistants and Nurse Practitioners) who all work together to provide you with the care you need, when you need it.  Your next appointment:   2 weeks with APP  16 week(s)  Provider:   Thomasene Ripple, DO      Other  Instructions:   1st Floor: - Lobby - Registration  - Pharmacy  - Lab - Cafe  2nd Floor: - PV Lab - Diagnostic Testing (echo, CT, nuclear med)  3rd Floor: - Vacant  4th Floor: - TCTS (cardiothoracic surgery) - AFib Clinic - Structural Heart Clinic - Vascular Surgery  - Vascular Ultrasound  5th Floor: - [facility] Cardiology (general and EP) - Clinical Pharmacy for coumadin, hypertension, lipid, weight-loss medications, and med management appointments    Valet parking services will be available as well.

## 2023-12-02 LAB — CBC
Hematocrit: 35.9 % (ref 34.0–46.6)
Hemoglobin: 12.4 g/dL (ref 11.1–15.9)
MCH: 31.9 pg (ref 26.6–33.0)
MCHC: 34.5 g/dL (ref 31.5–35.7)
MCV: 92 fL (ref 79–97)
Platelets: 237 10*3/uL (ref 150–450)
RBC: 3.89 x10E6/uL (ref 3.77–5.28)
RDW: 13.1 % (ref 11.7–15.4)
WBC: 9.5 10*3/uL (ref 3.4–10.8)

## 2023-12-02 LAB — COMPREHENSIVE METABOLIC PANEL WITH GFR
ALT: 34 IU/L — ABNORMAL HIGH (ref 0–32)
AST: 30 IU/L (ref 0–40)
Albumin: 3.9 g/dL (ref 3.8–4.8)
Alkaline Phosphatase: 111 IU/L (ref 44–121)
BUN/Creatinine Ratio: 18 (ref 12–28)
BUN: 24 mg/dL (ref 8–27)
Bilirubin Total: 0.5 mg/dL (ref 0.0–1.2)
CO2: 23 mmol/L (ref 20–29)
Calcium: 9 mg/dL (ref 8.7–10.3)
Chloride: 88 mmol/L — ABNORMAL LOW (ref 96–106)
Creatinine, Ser: 1.36 mg/dL — ABNORMAL HIGH (ref 0.57–1.00)
Globulin, Total: 2.3 g/dL (ref 1.5–4.5)
Glucose: 160 mg/dL — ABNORMAL HIGH (ref 70–99)
Potassium: 4.5 mmol/L (ref 3.5–5.2)
Sodium: 124 mmol/L — ABNORMAL LOW (ref 134–144)
Total Protein: 6.2 g/dL (ref 6.0–8.5)
eGFR: 40 mL/min/{1.73_m2} — ABNORMAL LOW (ref >59)

## 2023-12-02 LAB — MAGNESIUM: Magnesium: 2.8 mg/dL — ABNORMAL HIGH (ref 1.6–2.3)

## 2023-12-04 ENCOUNTER — Ambulatory Visit

## 2023-12-04 DIAGNOSIS — I499 Cardiac arrhythmia, unspecified: Secondary | ICD-10-CM | POA: Diagnosis not present

## 2023-12-04 DIAGNOSIS — R9431 Abnormal electrocardiogram [ECG] [EKG]: Secondary | ICD-10-CM | POA: Diagnosis not present

## 2023-12-04 DIAGNOSIS — I493 Ventricular premature depolarization: Secondary | ICD-10-CM | POA: Diagnosis not present

## 2023-12-05 ENCOUNTER — Telehealth: Payer: Self-pay | Admitting: *Deleted

## 2023-12-05 ENCOUNTER — Encounter: Payer: Self-pay | Admitting: Cardiology

## 2023-12-05 NOTE — Telephone Encounter (Signed)
 Cardiac Catheterization scheduled at [facility] for: Thursday December 07, 2023 1:30 PM Arrival time [facility] Main Entrance A at: 8:30 AM-pre-procedure hydration  Nothing to eat after midnight prior to procedure, clear liquids until 5 AM day of procedure.  Medication instructions: -Hold:  Eliquis-none 12/05/23 until post procedure  Hydrochlorothiazide/Lisinopril-day before and day of procedure-per protocol GFR < 60 (40)  Farxiga/Insulin-AM of procedure  Trulicity-weekly on Tuesdays-will hold until post procedure -Other usual morning medications can be taken with sips of water including aspirin 81 mg.  Plan to go home the same day, you will only stay overnight if medically necessary.  You must have responsible adult to drive you home.  Someone must be with you the first 24 hours after you arrive home.  Reviewed procedure instructions /pre-procedure hydration with patient.

## 2023-12-06 NOTE — Progress Notes (Signed)
 Cardiology Office Note:    Date:  12/06/2023   ID:  Pamela [name], DOB 27-May-1946, MRN [id number]  PCP:  Blane Ohara, MD  Cardiologist:  Thomasene Ripple, DO  Electrophysiologist:  None   Referring MD: Blane Ohara, MD    " I have had some chest pain"   History of Present Illness:    Pamela [name] is a 78 y.o. 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    Since her last visit with me she has had recurrent urinary tract infections and stage three kidney failure, presents with shortness of breath and fatigue, especially after physical exertion. The patient reports needing to rest after walking short distances and after performing household chores. The patient also experiences chest discomfort, described as feeling like "a ton of bricks," especially when extremely tired.  the most pressing concern is the persistent chest discomfort that is no coupled with intermittent shortness of breath.      Past Medical History:  Diagnosis Date   Anticoagulated on Coumadin    chronic--- managed by hematology/ oncology,   Asthma, mild intermittent    followed by pcp--- per pt last exacerbation winter 2021 w/ acute bronchitis   Bilateral leg cramps    Blood dyscrasia    Chronic constipation    CKD (chronic kidney disease), stage III (HCC)    Closed bimalleolar fracture of left ankle 02/26/2020   DDD (degenerative disc disease), cervical    w/ spondylosis,  per pt last steroid injection 06/ 2022   DDD (degenerative disc disease), lumbosacral    DVT (deep venous thrombosis) (HCC) 07/30/2013   Dyspnea    occasionally    Dysrhythmia    GERD (gastroesophageal reflux disease)    History of cardiac murmur as a child    History of DVT of lower extremity    left lower extremity in 1980s, fell when bowling   History of pulmonary embolus (PE) 1993   per pt left lung post op 2 wks cholecystectomy   History of rheumatic fever as a child    per last echo 12-31-2019 no valvular issues   History of TIA (transient ischemic attack)    2014 and 2018 or 2019,  per pt no residual   HTN (hypertension)    followed by pcp   Hyperlipidemia    Hypothyroidism    IDA (iron deficiency anemia)    Macular degeneration of both eyes    Mild obstructive sleep apnea    per pt dx 2017 tried to uses cpap but intolerant   Mixed incontinence urge and stress    urologist--- dr Saddie Benders   OA (osteoarthritis) 07/06/2018   Osteoporosis    PAF (paroxysmal atrial fibrillation) (HCC) 10/01/2014   cardiologist--- dr Lavona Mound Tashon Capp;   cardiac cath 02-18-2013 normal coronaries arteries, ef 50%, cath done since echo showed ef 30-35%; nucleat stress study 04/ 2020 normal , normal echo 04/ 2021,  event monitor 09-14-2020 rare ST/AT variable block   Pneumonia    PONV (postoperative nausea and vomiting)    Protein S deficiency (HCC)    followed by hemotology/ oncology-- dr d. Melvyn Neth ([facility]) dx 1980s;  prior DVT  left lower leg 1980s and left lung PE 1993; chronic  coumadin since 1980s   PVC's (premature ventricular contractions)    followed by cardiology   S/P cardiac catheterization 02/2013   Normal coronaries; low normal EF at 50%   Solitary pulmonary nodule on lung CT 02/06/2019   First noted 01/13/2014 > no change as of 12/21/2018   Spondylolisthesis, lumbar region 08/09/2018   Stroke (HCC)    TIA in 2018 or 2019   Transient ischemic attack 07/30/2013   Type 2 diabetes mellitus treated with insulin (HCC)    endocrilogist--- whitney reardon NP     (03-10-2021  pt continuously checks blood sugar throughout the day w/ Libre,  fasting sugar --- 69--200)   Wears glasses    Wears hearing aid in both ears     Past Surgical History:  Procedure Laterality Date   ABDOMINAL HYSTERECTOMY     BLEPHAROPLASTY     CARDIAC CATHETERIZATION  02/18/2013   @ [facility]  by Dr [place];  normal coronaries w/ preserved LVF, ef 50%;   previous cath 2001 normal ef 65%   CARPAL TUNNEL RELEASE Bilateral 1994   CATARACT EXTRACTION W/ INTRAOCULAR LENS IMPLANT Bilateral 2017   CHOLECYSTECTOMY     CHOLECYSTECTOMY, LAPAROSCOPIC  1993   COLONOSCOPY     EAR BIOPSY Left    FINGER SURGERY Left 2018   thumb   FOOT SURGERY     FOOT TENDON SURGERY Right    early 2000s   FRACTURE SURGERY Left 02/13/2020   left femur   HAND SURGERY Right 04/2022   HARDWARE REMOVAL Left 03/12/2021   Procedure: HARDWARE REMOVAL;  Surgeon: Yolonda Kida, MD;  Location: [facility];  Service: Orthopedics;  Laterality: Left;  60 MINS   JOINT REPLACEMENT     LUMBAR LAMINECTOMY/DECOMPRESSION MICRODISCECTOMY N/A 12/22/2022   Procedure: Lumbar Two-Lumbar Three, Lumbar Three-Lumbar Four Lumbar Decompression;  Surgeon: Barnett Abu, MD;  Location: [facility] OR;  Service: Neurosurgery;  Laterality: N/A;  RM 20 3C   ORIF ANKLE FRACTURE Left 02/26/2020   Procedure: OPEN REDUCTION INTERNAL FIXATION (ORIF) ANKLE FRACTURE;  Surgeon: Yolonda Kida, MD;  Location[facility];  Service: Orthopedics;  Laterality: Left;  90 mins   TONSILLECTOMY AND ADENOIDECTOMY Bilateral    TOTAL KNEE ARTHROPLASTY Left 03/2005   TOTAL VAGINAL HYSTERECTOMY  1988   per pt still has ovaries    Current Medications: Current Meds  Medication Sig   acetaminophen (TYLENOL) 650 MG CR tablet Take 650-1,300 mg by mouth every 8 (eight) hours as needed for pain.   albuterol (VENTOLIN HFA) 108 (90 Base) MCG/ACT inhaler INHALE 1 TO 2 PUFFS INTO THE LUNGS EVERY 6 HOURS AS NEEDED FOR WHEEZING OR SHORTNESS OF BREATH   aspirin EC (ASPIRIN LOW DOSE) 81 MG tablet TAKE 1 TABLET(81 MG) BY MOUTH  DAILY   atorvastatin (LIPITOR) 80 MG tablet TAKE 1 TABLET(80 MG) BY MOUTH DAILY   carboxymethylcellulose (REFRESH PLUS) 0.5 % SOLN Place 1 drop into both eyes 3 (three) times daily as needed (dry eyes).   [EXPIRED] cephALEXin (KEFLEX) 500 MG capsule Take 1 capsule (500 mg total) by mouth 2 (two) times daily for 7 days.   Cholecalciferol (VITAMIN D3) 50 MCG (2000 UT) TABS Take 4,000 Units by mouth daily with lunch.   Continuous Glucose Sensor (FREESTYLE LIBRE 2 SENSOR) MISC 2 each by Does not apply route every 14 (fourteen) days. E11.40   cyanocobalamin (VITAMIN B12) 1000 MCG tablet Take 1,000 mcg by mouth daily with  lunch.   dapagliflozin propanediol (FARXIGA) 10 MG TABS tablet Take 1 tablet (10 mg total) by mouth daily before breakfast.   Dulaglutide (TRULICITY) 0.75 MG/0.5ML SOAJ Inject 0.75 mg into the skin once a week.   estradiol (ESTRACE) 0.1 MG/GM vaginal cream Place 1 Applicatorful vaginally 2 (two) times a week. As needed   ferrous sulfate 325 (65 FE) MG tablet Take 325 mg by mouth daily with lunch.   GEMTESA 75 MG TABS Take 75 mg by mouth at bedtime.   hydrochlorothiazide (HYDRODIURIL) 12.5 MG tablet Take 12.5 mg by mouth daily with lunch.   insulin aspart (NOVOLOG FLEXPEN) 100 UNIT/ML FlexPen Max daily 30 units (Patient taking differently: Inject 6 Units into the skin 3 (three) times daily with meals. Sliding scale Max daily 30 units)   Insulin Pen Needle (BD PEN NEEDLE NANO 2ND GEN) 32G X 4 MM MISC 1 Device by Other route in the morning, at noon, in the evening, and at bedtime. USE TO INJECT INSULIN four times DAILY   isosorbide mononitrate (IMDUR) 30 MG 24 hr tablet TAKE 1 TABLET BY MOUTH DAILY. PLEASE ARRANGE APPT FOR FURTHER REFILLS   lisinopril (ZESTRIL) 20 MG tablet TAKE 1 TABLETS BY MOUTH TWICE DAILY   magnesium oxide (MAG-OX) 400 (240 Mg) MG tablet TAKE 2 TABLETS(800 MG) BY MOUTH TWICE DAILY (Patient taking differently: Take 800 mg by mouth 2 (two) times daily.)   Multiple  Vitamins-Minerals (PRESERVISION AREDS PO) Take 1 capsule by mouth 2 (two) times daily.   nystatin cream (MYCOSTATIN) Apply 1 Application topically 4 (four) times daily as needed for dry skin (yeast infection).   ondansetron (ZOFRAN-ODT) 8 MG disintegrating tablet Take 8 mg by mouth every 8 (eight) hours as needed for nausea or vomiting.    pantoprazole (PROTONIX) 40 MG tablet TAKE 1 TABLET(40 MG) BY MOUTH EVERY MORNING   pyridoxine (B-6) 100 MG tablet Take 100 mg by mouth daily with lunch.   sennosides-docusate sodium (SENOKOT-S) 8.6-50 MG tablet Take 1 tablet by mouth daily as needed for constipation.   sodium chloride (OCEAN) 0.65 % SOLN nasal spray Place 1 spray into both nostrils as needed for congestion.   zolpidem (AMBIEN) 5 MG tablet TAKE 1 TABLET(5 MG) BY MOUTH AT BEDTIME AS NEEDED FOR SLEEP (Patient taking differently: Take 5 mg by mouth at bedtime.)   [DISCONTINUED] apixaban (ELIQUIS) 5 MG TABS tablet Take 1 tablet (5 mg total) by mouth 2 (two) times daily.   [DISCONTINUED] polyethylene glycol powder (MIRALAX) 17 GM/SCOOP powder Take 17 g by mouth daily as needed for moderate constipation.     Allergies:   Ketek [telithromycin], Loxapine succinate, Macrodantin [nitrofurantoin macrocrystal], Naproxen sodium, Amoxapine and related, Darvon, Belsomra [suvorexant], Cymbalta [duloxetine hcl], Duloxetine, Gabapentin, Lyrica [pregabalin], Ozempic (0.25 or 0.5 mg-dose) [semaglutide(0.25 or 0.5mg -dos)], Semaglutide, Amoxicillin, and Propoxyphene   Social History   Socioeconomic History   Marital status: Married    Spouse name: Jomarie Longs   Number of children: Not on file   Years of education: Not on file   Highest education level: Some college, no degree  Occupational History   Not on file  Tobacco Use   Smoking status: Never   Smokeless tobacco: Never  Vaping Use   Vaping status: Never Used  Substance and Sexual Activity   Alcohol use: No   Drug use: Never   Sexual activity: Not on file     Comment: Hysterectomy  Other Topics Concern   Not on file  Social History Narrative   Lives with  spouse in [place].  2 grown daughters.   Retired Print production planner     Social Drivers of Corporate investment banker Strain: Low Risk  (10/22/2023)   Overall Financial Resource Strain (CARDIA)    Difficulty of Paying Living Expenses: Not very hard  Food Insecurity: No Food Insecurity (10/22/2023)   Hunger Vital Sign    Worried About Running Out of Food in the Last Year: Never true    Ran Out of Food in the Last Year: Never true  Transportation Needs: No Transportation Needs (10/22/2023)   PRAPARE - Administrator, Civil Service (Medical): No    Lack of Transportation (Non-Medical): No  Physical Activity: Inactive (10/22/2023)   Exercise Vital Sign    Days of Exercise per Week: 0 days    Minutes of Exercise per Session: 30 min  Stress: No Stress Concern Present (10/22/2023)   [employer] of Occupational Health - Occupational Stress Questionnaire    Feeling of Stress : Only a little  Social Connections: Socially Integrated (10/22/2023)   Social Connection and Isolation Panel [NHANES]    Frequency of Communication with Friends and Family: More than three times a week    Frequency of Social Gatherings with Friends and Family: Patient declined    Attends Religious Services: More than 4 times per year    Active Member of Clubs or Organizations: Patient declined    Attends Engineer, structural: More than 4 times per year    Marital Status: Married     Family History: The patient's family history includes Antithrombin III deficiency in her mother; Cirrhosis in her mother; Coronary artery disease in her father; Diabetes in her father and sister; Heart attack in her brother, father, and sister; Hypertension in her brother, father, and sister; Kidney disease in her father; Lung cancer in her brother; Protein S deficiency in her daughter; Ulcerative colitis in her mother.  There is no history of Stroke, Colitis, Colon polyps, Esophageal cancer, Liver cancer, Pancreatic cancer, Rectal cancer, Stomach cancer, or Breast cancer.  ROS:   Review of Systems  Constitution: Negative for decreased appetite, fever and weight gain.  HENT: Negative for congestion, ear discharge, hoarse voice and sore throat.   Eyes: Negative for discharge, redness, vision loss in right eye and visual halos.  Cardiovascular: Negative for chest pain, dyspnea on exertion, leg swelling, orthopnea and palpitations.  Respiratory: Negative for cough, hemoptysis, shortness of breath and snoring.   Endocrine: Negative for heat intolerance and polyphagia.  Hematologic/Lymphatic: Negative for bleeding problem. Does not bruise/bleed easily.  Skin: Negative for flushing, nail changes, rash and suspicious lesions.  Musculoskeletal: Negative for arthritis, joint pain, muscle cramps, myalgias, neck pain and stiffness.  Gastrointestinal: Negative for abdominal pain, bowel incontinence, diarrhea and excessive appetite.  Genitourinary: Negative for decreased libido, genital sores and incomplete emptying.  Neurological: Negative for brief paralysis, focal weakness, headaches and loss of balance.  Psychiatric/Behavioral: Negative for altered mental status, depression and suicidal ideas.  Allergic/Immunologic: Negative for HIV exposure and persistent infections.    EKGs/Labs/Other Studies Reviewed:    The following studies were reviewed today:   EKG: None today   Echocardiogram IMPRESSIONS   1. Left ventricular ejection fraction, by estimation, is 60 to 65%. The left ventricle has normal function. The left ventricle has no regional wall motion abnormalities. There is moderate concentric left ventricular hypertrophy. Left ventricular  diastolic parameters are consistent with Grade I diastolic dysfunction (impaired relaxation).   2. Right ventricular systolic function  is normal. The right ventricular size  is normal. There is normal pulmonary artery systolic pressure.   3. Left atrial size was mildly dilated.   4. The mitral valve is normal in structure. Trivial mitral valve regurgitation. No evidence of mitral stenosis.   5. The aortic valve is tricuspid. Aortic valve regurgitation is not visualized. No aortic stenosis is present.   6. The inferior vena cava is normal in size with greater than 50% respiratory variability, suggesting right atrial pressure of 3 mmHg.   Recent Labs: 09/27/2023: Pro B Natriuretic peptide (BNP) 91.0; TSH 1.97 12/01/2023: ALT 34; BUN 24; Creatinine, Ser 1.36; Hemoglobin 12.4; Magnesium 2.8; Platelets 237; Potassium 4.5; Sodium 124  Recent Lipid Panel    Component Value Date/Time   CHOL 136 10/24/2023 0843   TRIG 61 10/24/2023 0843   HDL 71 10/24/2023 0843   CHOLHDL 1.9 10/24/2023 0843   LDLCALC 52 10/24/2023 0843    Physical Exam:    VS:  BP (!) 142/48 (BP Location: Left Arm, Patient Position: Sitting, Cuff Size: Normal)   Pulse (!) 59   Ht 5\' 1"  (1.549 m)   Wt 147 lb (66.7 kg)   SpO2 98%   BMI 27.78 kg/m     Wt Readings from Last 3 Encounters:  12/01/23 147 lb (66.7 kg)  11/27/23 143 lb (64.9 kg)  10/31/23 142 lb (64.4 kg)     GEN: Well nourished, well developed in no acute distress HEENT: Normal NECK: No JVD; No carotid bruits LYMPHATICS: No lymphadenopathy CARDIAC: S1S2 noted,RRR, no murmurs, rubs, gallops RESPIRATORY:  Clear to auscultation without rales, wheezing or rhonchi  ABDOMEN: Soft, non-tender, non-distended, +bowel sounds, no guarding. EXTREMITIES: No edema, No cyanosis, no clubbing MUSCULOSKELETAL:  No deformity  SKIN: Warm and dry NEUROLOGIC:  Alert and oriented x 3, non-focal PSYCHIATRIC:  Normal affect, good insight  ASSESSMENT:    1. Atypical angina (HCC)   2. Pre-procedure lab exam   3. SOB (shortness of breath)   4. Mild CAD   5. NSVT (nonsustained ventricular tachycardia) (HCC)   6. Frequent PVCs      PLAN:        Atypical angina Symptoms suggest atypical angina, especially given diabetic status. Cardiac catheterization to evaluate coronary artery disease. Explained risks and benefits of cardiac catheterization. - Order pulmonary function test. - Schedule cardiac catheterization. - Ensure driver availability for procedure. - Provide hydration pre-catheterization.  Anticoagulation management On Eliquis for protein S deficiency.   Hypertension Blood pressure variable, target 140/90. Monitoring closely.  Diabetes mellitus Significant glucose fluctuations. Coordinating with endocrinologist.  Chronic kidney disease stage 3 Stage 3 CKD, under nephrologist care. Hydration considered for catheterization.  Follow-up Follow-up on test results to guide management. - Schedule follow-up 16 weeks post-cardiac catheterization. - Arrange follow-up with advanced practice provider 2 weeks post-cardiac catheterization.       Informed Consent   Shared Decision Making/Informed Consent The risks [stroke (1 in 1000), death (1 in 1000), kidney failure [usually temporary] (1 in 500), bleeding (1 in 200), allergic reaction [possibly serious] (1 in 200)], benefits (diagnostic support and management of coronary artery disease) and alternatives of a cardiac catheterization were discussed in detail with Ms. Maxcy and she is willing to proceed.      NSVT - cont dose of AV nodal blocker.  Follow-up in 4 months or sooner if symptoms persist or wor   The patient is in agreement with the above plan. The patient left the office in stable condition.  The  patient will follow up in33months or sooner if needed.   Medication Adjustments/Labs and Tests Ordered: Current medicines are reviewed at length with the patient today.  Concerns regarding medicines are outlined above.  Orders Placed This Encounter  Procedures   Comprehensive Metabolic Panel (CMET)   Magnesium   CBC   Pulmonary function test    No orders of  the defined types were placed in this encounter.   Patient Instructions  Medication Instructions:  Your physician recommends that you continue on your current medications as directed. Please refer to the Current Medication list given to you today.  *If you need a refill on your cardiac medications before your next appointment, please call your pharmacy*  Lab Work: CMET, Mag, CBC If you have labs (blood work) drawn today and your tests are completely normal, you will receive your results only by: MyChart Message (if you have MyChart) OR A paper copy in the mail If you have any lab test that is abnormal or we need to change your treatment, we will call you to review the results.  Testing/Procedures: You are scheduled for a Cardiac Catheterization on Thursday, April 3 with Dr. Verne Carrow.  1. Please arrive at the [facility] (Main Entrance A) at [facility]: [address] [place], [place] [address] at 11:00 AM   Free valet parking service is available. You will check in at ADMITTING. The support person will be asked to wait in the waiting room.  It is OK to have someone drop you off and come back when you are ready to be discharged.    Special note: Every effort is made to have your procedure done on time. Please understand that emergencies sometimes delay scheduled procedures.  2. Diet: Do not eat solid foods after midnight.  The patient may have clear liquids until 5am upon the day of the procedure.  3. Labs: You will need to have blood drawn on TODAY.  4. Medication instructions in preparation for your procedure:   Contrast Allergy: No   Stop taking Eliquis (Apixiban) on Tuesday, April 1.  Stop taking, Lisinopril (Zestril or Prinivil) and HTCZ (Hydrochlorothiazide) Wednesday, December 06, 2023.  Take only 1/2 dose of insulin the night before your procedure. Do not take any insulin on the day of the procedure.  On the morning of your procedure, take your Aspirin 81  mg and any morning medicines NOT listed above.  You may use sips of water.  5. Plan to go home the same day, you will only stay overnight if medically necessary. 6. Bring a current list of your medications and current insurance cards. 7. You MUST have a responsible person to drive you home. 8. Someone MUST be with you the first 24 hours after you arrive home or your discharge will be delayed. 9. Please wear clothes that are easy to get on and off and wear slip-on shoes.  Thank you for allowing [place] to care for you!   -- [facility]  Pulmonary Function Tests Pulmonary function tests (PFTs) are breathing tests that are used to: Measure how well your lungs work. Find out what is causing your lung problems. Find the best treatment for you. You may have PFTs: If you have a condition that affects your lungs, such as asthma or chronic obstructive pulmonary disease (COPD). To watch for changes in your lung function over time if you have a long-term (chronic) lung disease. If you are an IT trainer. PFTs check  the effects of being exposed to chemicals over a long period of time. To check lung function: Before having surgery or other procedures. If you smoke. To check if prescribed medicines or treatments are helping your lungs. Tell a health care provider about: Any allergies you have. All medicines you are taking, including inhaler or nebulizer medicines, vitamins, herbs, eye drops, creams, and over-the-counter medicines. Any bleeding problems you have. Any surgeries you have had, especially recent surgery of the eye, abdomen, or chest. These can make PFTs difficult or unsafe. Any medical conditions you have, including chest pain or heart problems, tuberculosis, or respiratory infections such as pneumonia, a cold, or the flu. Any fear of being in closed spaces (claustrophobia). Some of your tests may be in a closed space. What are the risks? Your  health care provider will talk with you about risks. These may include: Feeling light-headed due to fast, deep breathing known as overbreathing or hyperventilation. An asthma attack from deep breathing. What happens before the test? Take over-the-counter and prescription medicines only as told by your health care provider. If you take inhaler or nebulizer medicines, ask your health care provider which medicines you should take on the day of your testing. Some inhaler medicines may interfere with PFTs if they are taken shortly before the tests. Follow instructions from your health care provider about what you may eat and drink. These may include: Avoiding eating large meals. Avoiding using caffeine before the testing. Not drinkingalcohol for up to 4 hours before the test. Do not use any products that contain nicotine or tobacco for up to 4 hours before your test. These products include cigarettes, chewing tobacco, and vaping devices, such as e-cigarettes. These can affect your test results. If you need help quitting, ask your health care provider. Wear comfortable clothing that will not get in the way of your breathing. Avoid exercise that takes a lot of effort (strenuous exercise) for at least 30 minutes before the test. What happens during the test?  You will be given: A soft noseclip to wear. This allows all of your breaths to go through your mouth instead of your nose. A germ-free (sterile) mouthpiece. It will be attached to a spirometer machine that measures your breathing. You will be asked to do breathing exercises. The exercises will be done by breathing in (inhaling) and breathing out (exhaling). You may have to repeat the exercises many times before the testing is complete. You will need to follow instructions exactly as told to get accurate results. Make sure to blow as hard and as fast as you can when you are told to do so. You may be given a medicine called a bronchodilator. This makes  the small air passages in your lungs larger so you can breathe easier. The tests will be repeated after the medicine takes effect. You will be watched for any problems, such as feeling faint or dizzy, or having trouble breathing. The procedure may vary among health care providers and hospitals. What can I expect after the test? Your results will be compared with the expected lung function of someone with healthy lungs who is similar to you in several ways. These ways include age, sex, height, weight, and race or ethnicity. This is done to show how your lung function compares with normal lung function (percent predicted). The percent predicted helps your health care provider know if your lung function is normal or not. If you have had PFTs done before, your health care provider will compare  your current results with past results. This shows if your lung function is better, worse, or the same as before. It is up to you to get the results of your procedure. Ask your health care provider, or the department that is doing the procedure, when your results will be ready. After you get your results, talk with your health care provider about treatment options, if necessary. This information is not intended to replace advice given to you by your health care provider. Make sure you discuss any questions you have with your health care provider. Document Revised: 03/14/2022 Document Reviewed: 03/14/2022 Elsevier Patient Education  2024 [employer] Follow-Up: At [facility], you and your health needs are our priority.  As part of our continuing mission to provide you with exceptional heart care, our providers are all part of one team.  This team includes your primary Cardiologist (physician) and Advanced Practice Providers or APPs (Physician Assistants and Nurse Practitioners) who all work together to provide you with the care you need, when you need it.  Your next appointment:   2 weeks with APP  16  week(s)  Provider:   Thomasene Ripple, DO      Other Instructions:   1st Floor: - Lobby - Registration  - Pharmacy  - Lab - Cafe  2nd Floor: - PV Lab - Diagnostic Testing (echo, CT, nuclear med)  3rd Floor: - Vacant  4th Floor: - TCTS (cardiothoracic surgery) - AFib Clinic - Structural Heart Clinic - Vascular Surgery  - Vascular Ultrasound  5th Floor: - [facility] Cardiology (general and EP) - Clinical Pharmacy for coumadin, hypertension, lipid, weight-loss medications, and med management appointments    Valet parking services will be available as well.      Adopting a Healthy Lifestyle.  Know what a healthy weight is for you (roughly BMI <25) and aim to maintain this   Aim for 7+ servings of fruits and vegetables daily   65-80+ fluid ounces of water or unsweet tea for healthy kidneys   Limit to max 1 drink of alcohol per day; avoid smoking/tobacco   Limit animal fats in diet for cholesterol and heart health - choose grass fed whenever available   Avoid highly processed foods, and foods high in saturated/trans fats   Aim for low stress - take time to unwind and care for your mental health   Aim for 150 min of moderate intensity exercise weekly for heart health, and weights twice weekly for bone health   Aim for 7-9 hours of sleep daily   When it comes to diets, agreement about the perfect plan isnt easy to find, even among the experts. Experts at the [facility] [employer] developed an idea known as the Healthy Eating Plate. Just imagine a plate divided into logical, healthy portions.   The emphasis is on diet quality:   Load up on vegetables and fruits - one-half of your plate: Aim for color and variety, and remember that potatoes dont count.   Go for whole grains - one-quarter of your plate: Whole wheat, barley, wheat berries, quinoa, oats, brown rice, and foods made with them. If you want pasta, go with whole wheat pasta.   Protein power -  one-quarter of your plate: Fish, chicken, beans, and nuts are all healthy, versatile protein sources. Limit red meat.   The diet, however, does go beyond the plate, offering a few other suggestions.   Use healthy plant oils, such as olive, canola, soy, corn, sunflower and  peanut. Check the labels, and avoid partially hydrogenated oil, which have unhealthy trans fats.   If youre thirsty, drink water. Coffee and tea are good in moderation, but skip sugary drinks and limit milk and dairy products to one or two daily servings.   The type of carbohydrate in the diet is more important than the amount. Some sources of carbohydrates, such as vegetables, fruits, whole grains, and beans-are healthier than others.   Finally, stay active  Signed, Thomasene Ripple, DO  12/06/2023 4:33 PM    [facility]

## 2023-12-06 NOTE — H&P (View-Only) (Signed)
 Cardiology Office Note:    Date:  12/06/2023   ID:  Pamela [name], DOB 27-May-1946, MRN [id number]  PCP:  Blane Ohara, MD  Cardiologist:  Thomasene Ripple, DO  Electrophysiologist:  None   Referring MD: Blane Ohara, MD    " I have had some chest pain"   History of Present Illness:    Pamela [name] is a 78 y.o. 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    Since her last visit with me she has had recurrent urinary tract infections and stage three kidney failure, presents with shortness of breath and fatigue, especially after physical exertion. The patient reports needing to rest after walking short distances and after performing household chores. The patient also experiences chest discomfort, described as feeling like "a ton of bricks," especially when extremely tired.  the most pressing concern is the persistent chest discomfort that is no coupled with intermittent shortness of breath.      Past Medical History:  Diagnosis Date   Anticoagulated on Coumadin    chronic--- managed by hematology/ oncology,   Asthma, mild intermittent    followed by pcp--- per pt last exacerbation winter 2021 w/ acute bronchitis   Bilateral leg cramps    Blood dyscrasia    Chronic constipation    CKD (chronic kidney disease), stage III (HCC)    Closed bimalleolar fracture of left ankle 02/26/2020   DDD (degenerative disc disease), cervical    w/ spondylosis,  per pt last steroid injection 06/ 2022   DDD (degenerative disc disease), lumbosacral    DVT (deep venous thrombosis) (HCC) 07/30/2013   Dyspnea    occasionally    Dysrhythmia    GERD (gastroesophageal reflux disease)    History of cardiac murmur as a child    History of DVT of lower extremity    left lower extremity in 1980s, fell when bowling   History of pulmonary embolus (PE) 1993   per pt left lung post op 2 wks cholecystectomy   History of rheumatic fever as a child    per last echo 12-31-2019 no valvular issues   History of TIA (transient ischemic attack)    2014 and 2018 or 2019,  per pt no residual   HTN (hypertension)    followed by pcp   Hyperlipidemia    Hypothyroidism    IDA (iron deficiency anemia)    Macular degeneration of both eyes    Mild obstructive sleep apnea    per pt dx 2017 tried to uses cpap but intolerant   Mixed incontinence urge and stress    urologist--- dr Saddie Benders   OA (osteoarthritis) 07/06/2018   Osteoporosis    PAF (paroxysmal atrial fibrillation) (HCC) 10/01/2014   cardiologist--- dr Lavona Mound Tashon Capp;   cardiac cath 02-18-2013 normal coronaries arteries, ef 50%, cath done since echo showed ef 30-35%; nucleat stress study 04/ 2020 normal , normal echo 04/ 2021,  event monitor 09-14-2020 rare ST/AT variable block   Pneumonia    PONV (postoperative nausea and vomiting)    Protein S deficiency (HCC)    followed by hemotology/ oncology-- dr d. Melvyn Neth ([facility]) dx 1980s;  prior DVT  left lower leg 1980s and left lung PE 1993; chronic  coumadin since 1980s   PVC's (premature ventricular contractions)    followed by cardiology   S/P cardiac catheterization 02/2013   Normal coronaries; low normal EF at 50%   Solitary pulmonary nodule on lung CT 02/06/2019   First noted 01/13/2014 > no change as of 12/21/2018   Spondylolisthesis, lumbar region 08/09/2018   Stroke (HCC)    TIA in 2018 or 2019   Transient ischemic attack 07/30/2013   Type 2 diabetes mellitus treated with insulin (HCC)    endocrilogist--- whitney reardon NP     (03-10-2021  pt continuously checks blood sugar throughout the day w/ Libre,  fasting sugar --- 69--200)   Wears glasses    Wears hearing aid in both ears     Past Surgical History:  Procedure Laterality Date   ABDOMINAL HYSTERECTOMY     BLEPHAROPLASTY     CARDIAC CATHETERIZATION  02/18/2013   @ [facility]  by Dr [place];  normal coronaries w/ preserved LVF, ef 50%;   previous cath 2001 normal ef 65%   CARPAL TUNNEL RELEASE Bilateral 1994   CATARACT EXTRACTION W/ INTRAOCULAR LENS IMPLANT Bilateral 2017   CHOLECYSTECTOMY     CHOLECYSTECTOMY, LAPAROSCOPIC  1993   COLONOSCOPY     EAR BIOPSY Left    FINGER SURGERY Left 2018   thumb   FOOT SURGERY     FOOT TENDON SURGERY Right    early 2000s   FRACTURE SURGERY Left 02/13/2020   left femur   HAND SURGERY Right 04/2022   HARDWARE REMOVAL Left 03/12/2021   Procedure: HARDWARE REMOVAL;  Surgeon: Yolonda Kida, MD;  Location: [facility];  Service: Orthopedics;  Laterality: Left;  60 MINS   JOINT REPLACEMENT     LUMBAR LAMINECTOMY/DECOMPRESSION MICRODISCECTOMY N/A 12/22/2022   Procedure: Lumbar Two-Lumbar Three, Lumbar Three-Lumbar Four Lumbar Decompression;  Surgeon: Barnett Abu, MD;  Location: [facility] OR;  Service: Neurosurgery;  Laterality: N/A;  RM 20 3C   ORIF ANKLE FRACTURE Left 02/26/2020   Procedure: OPEN REDUCTION INTERNAL FIXATION (ORIF) ANKLE FRACTURE;  Surgeon: Yolonda Kida, MD;  Location[facility];  Service: Orthopedics;  Laterality: Left;  90 mins   TONSILLECTOMY AND ADENOIDECTOMY Bilateral    TOTAL KNEE ARTHROPLASTY Left 03/2005   TOTAL VAGINAL HYSTERECTOMY  1988   per pt still has ovaries    Current Medications: Current Meds  Medication Sig   acetaminophen (TYLENOL) 650 MG CR tablet Take 650-1,300 mg by mouth every 8 (eight) hours as needed for pain.   albuterol (VENTOLIN HFA) 108 (90 Base) MCG/ACT inhaler INHALE 1 TO 2 PUFFS INTO THE LUNGS EVERY 6 HOURS AS NEEDED FOR WHEEZING OR SHORTNESS OF BREATH   aspirin EC (ASPIRIN LOW DOSE) 81 MG tablet TAKE 1 TABLET(81 MG) BY MOUTH  DAILY   atorvastatin (LIPITOR) 80 MG tablet TAKE 1 TABLET(80 MG) BY MOUTH DAILY   carboxymethylcellulose (REFRESH PLUS) 0.5 % SOLN Place 1 drop into both eyes 3 (three) times daily as needed (dry eyes).   [EXPIRED] cephALEXin (KEFLEX) 500 MG capsule Take 1 capsule (500 mg total) by mouth 2 (two) times daily for 7 days.   Cholecalciferol (VITAMIN D3) 50 MCG (2000 UT) TABS Take 4,000 Units by mouth daily with lunch.   Continuous Glucose Sensor (FREESTYLE LIBRE 2 SENSOR) MISC 2 each by Does not apply route every 14 (fourteen) days. E11.40   cyanocobalamin (VITAMIN B12) 1000 MCG tablet Take 1,000 mcg by mouth daily with  lunch.   dapagliflozin propanediol (FARXIGA) 10 MG TABS tablet Take 1 tablet (10 mg total) by mouth daily before breakfast.   Dulaglutide (TRULICITY) 0.75 MG/0.5ML SOAJ Inject 0.75 mg into the skin once a week.   estradiol (ESTRACE) 0.1 MG/GM vaginal cream Place 1 Applicatorful vaginally 2 (two) times a week. As needed   ferrous sulfate 325 (65 FE) MG tablet Take 325 mg by mouth daily with lunch.   GEMTESA 75 MG TABS Take 75 mg by mouth at bedtime.   hydrochlorothiazide (HYDRODIURIL) 12.5 MG tablet Take 12.5 mg by mouth daily with lunch.   insulin aspart (NOVOLOG FLEXPEN) 100 UNIT/ML FlexPen Max daily 30 units (Patient taking differently: Inject 6 Units into the skin 3 (three) times daily with meals. Sliding scale Max daily 30 units)   Insulin Pen Needle (BD PEN NEEDLE NANO 2ND GEN) 32G X 4 MM MISC 1 Device by Other route in the morning, at noon, in the evening, and at bedtime. USE TO INJECT INSULIN four times DAILY   isosorbide mononitrate (IMDUR) 30 MG 24 hr tablet TAKE 1 TABLET BY MOUTH DAILY. PLEASE ARRANGE APPT FOR FURTHER REFILLS   lisinopril (ZESTRIL) 20 MG tablet TAKE 1 TABLETS BY MOUTH TWICE DAILY   magnesium oxide (MAG-OX) 400 (240 Mg) MG tablet TAKE 2 TABLETS(800 MG) BY MOUTH TWICE DAILY (Patient taking differently: Take 800 mg by mouth 2 (two) times daily.)   Multiple  Vitamins-Minerals (PRESERVISION AREDS PO) Take 1 capsule by mouth 2 (two) times daily.   nystatin cream (MYCOSTATIN) Apply 1 Application topically 4 (four) times daily as needed for dry skin (yeast infection).   ondansetron (ZOFRAN-ODT) 8 MG disintegrating tablet Take 8 mg by mouth every 8 (eight) hours as needed for nausea or vomiting.    pantoprazole (PROTONIX) 40 MG tablet TAKE 1 TABLET(40 MG) BY MOUTH EVERY MORNING   pyridoxine (B-6) 100 MG tablet Take 100 mg by mouth daily with lunch.   sennosides-docusate sodium (SENOKOT-S) 8.6-50 MG tablet Take 1 tablet by mouth daily as needed for constipation.   sodium chloride (OCEAN) 0.65 % SOLN nasal spray Place 1 spray into both nostrils as needed for congestion.   zolpidem (AMBIEN) 5 MG tablet TAKE 1 TABLET(5 MG) BY MOUTH AT BEDTIME AS NEEDED FOR SLEEP (Patient taking differently: Take 5 mg by mouth at bedtime.)   [DISCONTINUED] apixaban (ELIQUIS) 5 MG TABS tablet Take 1 tablet (5 mg total) by mouth 2 (two) times daily.   [DISCONTINUED] polyethylene glycol powder (MIRALAX) 17 GM/SCOOP powder Take 17 g by mouth daily as needed for moderate constipation.     Allergies:   Ketek [telithromycin], Loxapine succinate, Macrodantin [nitrofurantoin macrocrystal], Naproxen sodium, Amoxapine and related, Darvon, Belsomra [suvorexant], Cymbalta [duloxetine hcl], Duloxetine, Gabapentin, Lyrica [pregabalin], Ozempic (0.25 or 0.5 mg-dose) [semaglutide(0.25 or 0.5mg -dos)], Semaglutide, Amoxicillin, and Propoxyphene   Social History   Socioeconomic History   Marital status: Married    Spouse name: Jomarie Longs   Number of children: Not on file   Years of education: Not on file   Highest education level: Some college, no degree  Occupational History   Not on file  Tobacco Use   Smoking status: Never   Smokeless tobacco: Never  Vaping Use   Vaping status: Never Used  Substance and Sexual Activity   Alcohol use: No   Drug use: Never   Sexual activity: Not on file     Comment: Hysterectomy  Other Topics Concern   Not on file  Social History Narrative   Lives with  spouse in [place].  2 grown daughters.   Retired Print production planner     Social Drivers of Corporate investment banker Strain: Low Risk  (10/22/2023)   Overall Financial Resource Strain (CARDIA)    Difficulty of Paying Living Expenses: Not very hard  Food Insecurity: No Food Insecurity (10/22/2023)   Hunger Vital Sign    Worried About Running Out of Food in the Last Year: Never true    Ran Out of Food in the Last Year: Never true  Transportation Needs: No Transportation Needs (10/22/2023)   PRAPARE - Administrator, Civil Service (Medical): No    Lack of Transportation (Non-Medical): No  Physical Activity: Inactive (10/22/2023)   Exercise Vital Sign    Days of Exercise per Week: 0 days    Minutes of Exercise per Session: 30 min  Stress: No Stress Concern Present (10/22/2023)   [employer] of Occupational Health - Occupational Stress Questionnaire    Feeling of Stress : Only a little  Social Connections: Socially Integrated (10/22/2023)   Social Connection and Isolation Panel [NHANES]    Frequency of Communication with Friends and Family: More than three times a week    Frequency of Social Gatherings with Friends and Family: Patient declined    Attends Religious Services: More than 4 times per year    Active Member of Clubs or Organizations: Patient declined    Attends Engineer, structural: More than 4 times per year    Marital Status: Married     Family History: The patient's family history includes Antithrombin III deficiency in her mother; Cirrhosis in her mother; Coronary artery disease in her father; Diabetes in her father and sister; Heart attack in her brother, father, and sister; Hypertension in her brother, father, and sister; Kidney disease in her father; Lung cancer in her brother; Protein S deficiency in her daughter; Ulcerative colitis in her mother.  There is no history of Stroke, Colitis, Colon polyps, Esophageal cancer, Liver cancer, Pancreatic cancer, Rectal cancer, Stomach cancer, or Breast cancer.  ROS:   Review of Systems  Constitution: Negative for decreased appetite, fever and weight gain.  HENT: Negative for congestion, ear discharge, hoarse voice and sore throat.   Eyes: Negative for discharge, redness, vision loss in right eye and visual halos.  Cardiovascular: Negative for chest pain, dyspnea on exertion, leg swelling, orthopnea and palpitations.  Respiratory: Negative for cough, hemoptysis, shortness of breath and snoring.   Endocrine: Negative for heat intolerance and polyphagia.  Hematologic/Lymphatic: Negative for bleeding problem. Does not bruise/bleed easily.  Skin: Negative for flushing, nail changes, rash and suspicious lesions.  Musculoskeletal: Negative for arthritis, joint pain, muscle cramps, myalgias, neck pain and stiffness.  Gastrointestinal: Negative for abdominal pain, bowel incontinence, diarrhea and excessive appetite.  Genitourinary: Negative for decreased libido, genital sores and incomplete emptying.  Neurological: Negative for brief paralysis, focal weakness, headaches and loss of balance.  Psychiatric/Behavioral: Negative for altered mental status, depression and suicidal ideas.  Allergic/Immunologic: Negative for HIV exposure and persistent infections.    EKGs/Labs/Other Studies Reviewed:    The following studies were reviewed today:   EKG: None today   Echocardiogram IMPRESSIONS   1. Left ventricular ejection fraction, by estimation, is 60 to 65%. The left ventricle has normal function. The left ventricle has no regional wall motion abnormalities. There is moderate concentric left ventricular hypertrophy. Left ventricular  diastolic parameters are consistent with Grade I diastolic dysfunction (impaired relaxation).   2. Right ventricular systolic function  is normal. The right ventricular size  is normal. There is normal pulmonary artery systolic pressure.   3. Left atrial size was mildly dilated.   4. The mitral valve is normal in structure. Trivial mitral valve regurgitation. No evidence of mitral stenosis.   5. The aortic valve is tricuspid. Aortic valve regurgitation is not visualized. No aortic stenosis is present.   6. The inferior vena cava is normal in size with greater than 50% respiratory variability, suggesting right atrial pressure of 3 mmHg.   Recent Labs: 09/27/2023: Pro B Natriuretic peptide (BNP) 91.0; TSH 1.97 12/01/2023: ALT 34; BUN 24; Creatinine, Ser 1.36; Hemoglobin 12.4; Magnesium 2.8; Platelets 237; Potassium 4.5; Sodium 124  Recent Lipid Panel    Component Value Date/Time   CHOL 136 10/24/2023 0843   TRIG 61 10/24/2023 0843   HDL 71 10/24/2023 0843   CHOLHDL 1.9 10/24/2023 0843   LDLCALC 52 10/24/2023 0843    Physical Exam:    VS:  BP (!) 142/48 (BP Location: Left Arm, Patient Position: Sitting, Cuff Size: Normal)   Pulse (!) 59   Ht 5\' 1"  (1.549 m)   Wt 147 lb (66.7 kg)   SpO2 98%   BMI 27.78 kg/m     Wt Readings from Last 3 Encounters:  12/01/23 147 lb (66.7 kg)  11/27/23 143 lb (64.9 kg)  10/31/23 142 lb (64.4 kg)     GEN: Well nourished, well developed in no acute distress HEENT: Normal NECK: No JVD; No carotid bruits LYMPHATICS: No lymphadenopathy CARDIAC: S1S2 noted,RRR, no murmurs, rubs, gallops RESPIRATORY:  Clear to auscultation without rales, wheezing or rhonchi  ABDOMEN: Soft, non-tender, non-distended, +bowel sounds, no guarding. EXTREMITIES: No edema, No cyanosis, no clubbing MUSCULOSKELETAL:  No deformity  SKIN: Warm and dry NEUROLOGIC:  Alert and oriented x 3, non-focal PSYCHIATRIC:  Normal affect, good insight  ASSESSMENT:    1. Atypical angina (HCC)   2. Pre-procedure lab exam   3. SOB (shortness of breath)   4. Mild CAD   5. NSVT (nonsustained ventricular tachycardia) (HCC)   6. Frequent PVCs      PLAN:        Atypical angina Symptoms suggest atypical angina, especially given diabetic status. Cardiac catheterization to evaluate coronary artery disease. Explained risks and benefits of cardiac catheterization. - Order pulmonary function test. - Schedule cardiac catheterization. - Ensure driver availability for procedure. - Provide hydration pre-catheterization.  Anticoagulation management On Eliquis for protein S deficiency.   Hypertension Blood pressure variable, target 140/90. Monitoring closely.  Diabetes mellitus Significant glucose fluctuations. Coordinating with endocrinologist.  Chronic kidney disease stage 3 Stage 3 CKD, under nephrologist care. Hydration considered for catheterization.  Follow-up Follow-up on test results to guide management. - Schedule follow-up 16 weeks post-cardiac catheterization. - Arrange follow-up with advanced practice provider 2 weeks post-cardiac catheterization.       Informed Consent   Shared Decision Making/Informed Consent The risks [stroke (1 in 1000), death (1 in 1000), kidney failure [usually temporary] (1 in 500), bleeding (1 in 200), allergic reaction [possibly serious] (1 in 200)], benefits (diagnostic support and management of coronary artery disease) and alternatives of a cardiac catheterization were discussed in detail with Ms. Maxcy and she is willing to proceed.      NSVT - cont dose of AV nodal blocker.  Follow-up in 4 months or sooner if symptoms persist or wor   The patient is in agreement with the above plan. The patient left the office in stable condition.  The  patient will follow up in33months or sooner if needed.   Medication Adjustments/Labs and Tests Ordered: Current medicines are reviewed at length with the patient today.  Concerns regarding medicines are outlined above.  Orders Placed This Encounter  Procedures   Comprehensive Metabolic Panel (CMET)   Magnesium   CBC   Pulmonary function test    No orders of  the defined types were placed in this encounter.   Patient Instructions  Medication Instructions:  Your physician recommends that you continue on your current medications as directed. Please refer to the Current Medication list given to you today.  *If you need a refill on your cardiac medications before your next appointment, please call your pharmacy*  Lab Work: CMET, Mag, CBC If you have labs (blood work) drawn today and your tests are completely normal, you will receive your results only by: MyChart Message (if you have MyChart) OR A paper copy in the mail If you have any lab test that is abnormal or we need to change your treatment, we will call you to review the results.  Testing/Procedures: You are scheduled for a Cardiac Catheterization on Thursday, April 3 with Dr. Verne Carrow.  1. Please arrive at the [facility] (Main Entrance A) at [facility]: [address] [place], [place] [address] at 11:00 AM   Free valet parking service is available. You will check in at ADMITTING. The support person will be asked to wait in the waiting room.  It is OK to have someone drop you off and come back when you are ready to be discharged.    Special note: Every effort is made to have your procedure done on time. Please understand that emergencies sometimes delay scheduled procedures.  2. Diet: Do not eat solid foods after midnight.  The patient may have clear liquids until 5am upon the day of the procedure.  3. Labs: You will need to have blood drawn on TODAY.  4. Medication instructions in preparation for your procedure:   Contrast Allergy: No   Stop taking Eliquis (Apixiban) on Tuesday, April 1.  Stop taking, Lisinopril (Zestril or Prinivil) and HTCZ (Hydrochlorothiazide) Wednesday, December 06, 2023.  Take only 1/2 dose of insulin the night before your procedure. Do not take any insulin on the day of the procedure.  On the morning of your procedure, take your Aspirin 81  mg and any morning medicines NOT listed above.  You may use sips of water.  5. Plan to go home the same day, you will only stay overnight if medically necessary. 6. Bring a current list of your medications and current insurance cards. 7. You MUST have a responsible person to drive you home. 8. Someone MUST be with you the first 24 hours after you arrive home or your discharge will be delayed. 9. Please wear clothes that are easy to get on and off and wear slip-on shoes.  Thank you for allowing [place] to care for you!   -- [facility]  Pulmonary Function Tests Pulmonary function tests (PFTs) are breathing tests that are used to: Measure how well your lungs work. Find out what is causing your lung problems. Find the best treatment for you. You may have PFTs: If you have a condition that affects your lungs, such as asthma or chronic obstructive pulmonary disease (COPD). To watch for changes in your lung function over time if you have a long-term (chronic) lung disease. If you are an IT trainer. PFTs check  the effects of being exposed to chemicals over a long period of time. To check lung function: Before having surgery or other procedures. If you smoke. To check if prescribed medicines or treatments are helping your lungs. Tell a health care provider about: Any allergies you have. All medicines you are taking, including inhaler or nebulizer medicines, vitamins, herbs, eye drops, creams, and over-the-counter medicines. Any bleeding problems you have. Any surgeries you have had, especially recent surgery of the eye, abdomen, or chest. These can make PFTs difficult or unsafe. Any medical conditions you have, including chest pain or heart problems, tuberculosis, or respiratory infections such as pneumonia, a cold, or the flu. Any fear of being in closed spaces (claustrophobia). Some of your tests may be in a closed space. What are the risks? Your  health care provider will talk with you about risks. These may include: Feeling light-headed due to fast, deep breathing known as overbreathing or hyperventilation. An asthma attack from deep breathing. What happens before the test? Take over-the-counter and prescription medicines only as told by your health care provider. If you take inhaler or nebulizer medicines, ask your health care provider which medicines you should take on the day of your testing. Some inhaler medicines may interfere with PFTs if they are taken shortly before the tests. Follow instructions from your health care provider about what you may eat and drink. These may include: Avoiding eating large meals. Avoiding using caffeine before the testing. Not drinkingalcohol for up to 4 hours before the test. Do not use any products that contain nicotine or tobacco for up to 4 hours before your test. These products include cigarettes, chewing tobacco, and vaping devices, such as e-cigarettes. These can affect your test results. If you need help quitting, ask your health care provider. Wear comfortable clothing that will not get in the way of your breathing. Avoid exercise that takes a lot of effort (strenuous exercise) for at least 30 minutes before the test. What happens during the test?  You will be given: A soft noseclip to wear. This allows all of your breaths to go through your mouth instead of your nose. A germ-free (sterile) mouthpiece. It will be attached to a spirometer machine that measures your breathing. You will be asked to do breathing exercises. The exercises will be done by breathing in (inhaling) and breathing out (exhaling). You may have to repeat the exercises many times before the testing is complete. You will need to follow instructions exactly as told to get accurate results. Make sure to blow as hard and as fast as you can when you are told to do so. You may be given a medicine called a bronchodilator. This makes  the small air passages in your lungs larger so you can breathe easier. The tests will be repeated after the medicine takes effect. You will be watched for any problems, such as feeling faint or dizzy, or having trouble breathing. The procedure may vary among health care providers and hospitals. What can I expect after the test? Your results will be compared with the expected lung function of someone with healthy lungs who is similar to you in several ways. These ways include age, sex, height, weight, and race or ethnicity. This is done to show how your lung function compares with normal lung function (percent predicted). The percent predicted helps your health care provider know if your lung function is normal or not. If you have had PFTs done before, your health care provider will compare  your current results with past results. This shows if your lung function is better, worse, or the same as before. It is up to you to get the results of your procedure. Ask your health care provider, or the department that is doing the procedure, when your results will be ready. After you get your results, talk with your health care provider about treatment options, if necessary. This information is not intended to replace advice given to you by your health care provider. Make sure you discuss any questions you have with your health care provider. Document Revised: 03/14/2022 Document Reviewed: 03/14/2022 Elsevier Patient Education  2024 Elsevier Inc. Follow-Up: At [facility], you and your health needs are our priority.  As part of our continuing mission to provide you with exceptional heart care, our providers are all part of one team.  This team includes your primary Cardiologist (physician) and Advanced Practice Providers or APPs (Physician Assistants and Nurse Practitioners) who all work together to provide you with the care you need, when you need it.  Your next appointment:   2 weeks with APP  16  week(s)  Provider:   Thomasene Ripple, DO      Other Instructions:   1st Floor: - Lobby - Registration  - Pharmacy  - Lab - Cafe  2nd Floor: - PV Lab - Diagnostic Testing (echo, CT, nuclear med)  3rd Floor: - Vacant  4th Floor: - TCTS (cardiothoracic surgery) - AFib Clinic - Structural Heart Clinic - Vascular Surgery  - Vascular Ultrasound  5th Floor: - HeartCare Cardiology (general and EP) - Clinical Pharmacy for coumadin, hypertension, lipid, weight-loss medications, and med management appointments    Valet parking services will be available as well.      Adopting a Healthy Lifestyle.  Know what a healthy weight is for you (roughly BMI <25) and aim to maintain this   Aim for 7+ servings of fruits and vegetables daily   65-80+ fluid ounces of water or unsweet tea for healthy kidneys   Limit to max 1 drink of alcohol per day; avoid smoking/tobacco   Limit animal fats in diet for cholesterol and heart health - choose grass fed whenever available   Avoid highly processed foods, and foods high in saturated/trans fats   Aim for low stress - take time to unwind and care for your mental health   Aim for 150 min of moderate intensity exercise weekly for heart health, and weights twice weekly for bone health   Aim for 7-9 hours of sleep daily   When it comes to diets, agreement about the perfect plan isnt easy to find, even among the experts. Experts at the [facility] [employer] developed an idea known as the Healthy Eating Plate. Just imagine a plate divided into logical, healthy portions.   The emphasis is on diet quality:   Load up on vegetables and fruits - one-half of your plate: Aim for color and variety, and remember that potatoes dont count.   Go for whole grains - one-quarter of your plate: Whole wheat, barley, wheat berries, quinoa, oats, brown rice, and foods made with them. If you want pasta, go with whole wheat pasta.   Protein power -  one-quarter of your plate: Fish, chicken, beans, and nuts are all healthy, versatile protein sources. Limit red meat.   The diet, however, does go beyond the plate, offering a few other suggestions.   Use healthy plant oils, such as olive, canola, soy, corn, sunflower and  peanut. Check the labels, and avoid partially hydrogenated oil, which have unhealthy trans fats.   If youre thirsty, drink water. Coffee and tea are good in moderation, but skip sugary drinks and limit milk and dairy products to one or two daily servings.   The type of carbohydrate in the diet is more important than the amount. Some sources of carbohydrates, such as vegetables, fruits, whole grains, and beans-are healthier than others.   Finally, stay active  Signed, Thomasene Ripple, DO  12/06/2023 4:33 PM    [facility]

## 2023-12-07 ENCOUNTER — Encounter (HOSPITAL_COMMUNITY)
Admission: AD | Disposition: A | Discharge status: Home / Self Care | Payer: Self-pay | Source: Home / Self Care | Attending: Cardiovascular Disease

## 2023-12-07 ENCOUNTER — Other Ambulatory Visit: Payer: Self-pay

## 2023-12-07 ENCOUNTER — Observation Stay (HOSPITAL_COMMUNITY)
Admission: AD | Admit: 2023-12-07 | Discharge: 2023-12-08 | Disposition: A | Discharge status: Home / Self Care | Attending: Cardiovascular Disease | Admitting: Cardiovascular Disease

## 2023-12-07 ENCOUNTER — Encounter (HOSPITAL_COMMUNITY): Payer: Self-pay | Admitting: Cardiovascular Disease

## 2023-12-07 DIAGNOSIS — I110 Hypertensive heart disease with heart failure: Secondary | ICD-10-CM

## 2023-12-07 DIAGNOSIS — E871 Hypo-osmolality and hyponatremia: Secondary | ICD-10-CM | POA: Diagnosis present

## 2023-12-07 DIAGNOSIS — R072 Precordial pain: Secondary | ICD-10-CM

## 2023-12-07 DIAGNOSIS — Z86711 Personal history of pulmonary embolism: Secondary | ICD-10-CM | POA: Diagnosis not present

## 2023-12-07 DIAGNOSIS — N1831 Chronic kidney disease, stage 3a: Secondary | ICD-10-CM | POA: Diagnosis not present

## 2023-12-07 DIAGNOSIS — Z96652 Presence of left artificial knee joint: Secondary | ICD-10-CM | POA: Insufficient documentation

## 2023-12-07 DIAGNOSIS — Z8673 Personal history of transient ischemic attack (TIA), and cerebral infarction without residual deficits: Secondary | ICD-10-CM | POA: Insufficient documentation

## 2023-12-07 DIAGNOSIS — D6869 Other thrombophilia: Secondary | ICD-10-CM | POA: Diagnosis present

## 2023-12-07 DIAGNOSIS — I10 Essential (primary) hypertension: Secondary | ICD-10-CM | POA: Diagnosis present

## 2023-12-07 DIAGNOSIS — E039 Hypothyroidism, unspecified: Secondary | ICD-10-CM | POA: Diagnosis not present

## 2023-12-07 DIAGNOSIS — I480 Paroxysmal atrial fibrillation: Secondary | ICD-10-CM | POA: Diagnosis not present

## 2023-12-07 DIAGNOSIS — I5033 Acute on chronic diastolic (congestive) heart failure: Secondary | ICD-10-CM | POA: Diagnosis not present

## 2023-12-07 DIAGNOSIS — I5021 Acute systolic (congestive) heart failure: Secondary | ICD-10-CM | POA: Diagnosis not present

## 2023-12-07 DIAGNOSIS — Z7901 Long term (current) use of anticoagulants: Secondary | ICD-10-CM | POA: Diagnosis not present

## 2023-12-07 DIAGNOSIS — Z79899 Other long term (current) drug therapy: Secondary | ICD-10-CM | POA: Insufficient documentation

## 2023-12-07 DIAGNOSIS — I129 Hypertensive chronic kidney disease with stage 1 through stage 4 chronic kidney disease, or unspecified chronic kidney disease: Secondary | ICD-10-CM | POA: Insufficient documentation

## 2023-12-07 DIAGNOSIS — E1122 Type 2 diabetes mellitus with diabetic chronic kidney disease: Secondary | ICD-10-CM | POA: Insufficient documentation

## 2023-12-07 DIAGNOSIS — Z7982 Long term (current) use of aspirin: Secondary | ICD-10-CM | POA: Diagnosis not present

## 2023-12-07 DIAGNOSIS — Z7985 Long-term (current) use of injectable non-insulin antidiabetic drugs: Secondary | ICD-10-CM | POA: Diagnosis not present

## 2023-12-07 DIAGNOSIS — J45909 Unspecified asthma, uncomplicated: Secondary | ICD-10-CM | POA: Insufficient documentation

## 2023-12-07 DIAGNOSIS — Z86718 Personal history of other venous thrombosis and embolism: Secondary | ICD-10-CM | POA: Diagnosis not present

## 2023-12-07 DIAGNOSIS — Z794 Long term (current) use of insulin: Secondary | ICD-10-CM | POA: Insufficient documentation

## 2023-12-07 DIAGNOSIS — I5030 Unspecified diastolic (congestive) heart failure: Secondary | ICD-10-CM | POA: Diagnosis present

## 2023-12-07 DIAGNOSIS — D6859 Other primary thrombophilia: Secondary | ICD-10-CM | POA: Diagnosis present

## 2023-12-07 DIAGNOSIS — I2510 Atherosclerotic heart disease of native coronary artery without angina pectoris: Secondary | ICD-10-CM | POA: Diagnosis not present

## 2023-12-07 DIAGNOSIS — I428 Other cardiomyopathies: Secondary | ICD-10-CM | POA: Diagnosis not present

## 2023-12-07 HISTORY — DX: Acute on chronic diastolic (congestive) heart failure: I50.33

## 2023-12-07 HISTORY — DX: Acute systolic (congestive) heart failure: I50.21

## 2023-12-07 HISTORY — PX: LEFT HEART CATH AND CORONARY ANGIOGRAPHY: CATH118249

## 2023-12-07 LAB — CBC
HCT: 36.5 % (ref 36.0–46.0)
Hemoglobin: 12.4 g/dL (ref 12.0–15.0)
MCH: 31 pg (ref 26.0–34.0)
MCHC: 34 g/dL (ref 30.0–36.0)
MCV: 91.3 fL (ref 80.0–100.0)
Platelets: 223 10*3/uL (ref 150–400)
RBC: 4 MIL/uL (ref 3.87–5.11)
RDW: 14.2 % (ref 11.5–15.5)
WBC: 7.2 10*3/uL (ref 4.0–10.5)
nRBC: 0 % (ref 0.0–0.2)

## 2023-12-07 LAB — CREATININE, SERUM
Creatinine, Ser: 1.12 mg/dL — ABNORMAL HIGH (ref 0.44–1.00)
GFR, Estimated: 50 mL/min — ABNORMAL LOW (ref >60)

## 2023-12-07 LAB — BRAIN NATRIURETIC PEPTIDE: B Natriuretic Peptide: 202.5 pg/mL — ABNORMAL HIGH (ref 0.0–100.0)

## 2023-12-07 LAB — GLUCOSE, CAPILLARY
Glucose-Capillary: 156 mg/dL — ABNORMAL HIGH (ref 70–99)
Glucose-Capillary: 266 mg/dL — ABNORMAL HIGH (ref 70–99)
Glucose-Capillary: 168 mg/dL — ABNORMAL HIGH (ref 70–99)

## 2023-12-07 SURGERY — LEFT HEART CATH AND CORONARY ANGIOGRAPHY
Anesthesia: LOCAL

## 2023-12-07 MED ORDER — LIDOCAINE HCL (PF) 1 % IJ SOLN
INTRAMUSCULAR | Status: DC | PRN
  Administered 2023-12-07: 2 mL via INTRADERMAL

## 2023-12-07 MED ORDER — LISINOPRIL 20 MG PO TABS
20.0000 mg | ORAL_TABLET | Freq: Two times a day (BID) | ORAL | Status: DC
  Administered 2023-12-08: 20 mg via ORAL
  Filled 2023-12-07: qty 1

## 2023-12-07 MED ORDER — HYDRALAZINE HCL 20 MG/ML IJ SOLN
INTRAMUSCULAR | Status: DC | PRN
  Administered 2023-12-07: 10 mg via INTRAVENOUS

## 2023-12-07 MED ORDER — MIDAZOLAM HCL 2 MG/2ML IJ SOLN
INTRAMUSCULAR | Status: DC | PRN
  Administered 2023-12-07: 1 mg via INTRAVENOUS

## 2023-12-07 MED ORDER — ISOSORBIDE MONONITRATE ER 30 MG PO TB24
30.0000 mg | ORAL_TABLET | Freq: Every day | ORAL | Status: DC
  Administered 2023-12-08: 30 mg via ORAL
  Filled 2023-12-07: qty 1

## 2023-12-07 MED ORDER — DILTIAZEM HCL ER COATED BEADS 180 MG PO CP24
180.0000 mg | ORAL_CAPSULE | Freq: Every day | ORAL | Status: DC
  Administered 2023-12-08: 180 mg via ORAL
  Filled 2023-12-07: qty 1

## 2023-12-07 MED ORDER — ASPIRIN 81 MG PO TBEC
81.0000 mg | DELAYED_RELEASE_TABLET | Freq: Every day | ORAL | Status: DC
  Administered 2023-12-08: 81 mg via ORAL
  Filled 2023-12-07: qty 1

## 2023-12-07 MED ORDER — ASPIRIN 81 MG PO CHEW: 81.0000 mg | CHEWABLE_TABLET | ORAL | Status: DC

## 2023-12-07 MED ORDER — HEPARIN SODIUM (PORCINE) 5000 UNIT/ML IJ SOLN
5000.0000 [IU] | Freq: Three times a day (TID) | INTRAMUSCULAR | Status: DC
  Filled 2023-12-07: qty 1

## 2023-12-07 MED ORDER — SODIUM CHLORIDE 0.9% FLUSH: 3.0000 mL | INTRAVENOUS | Status: DC | PRN

## 2023-12-07 MED ORDER — ATORVASTATIN CALCIUM 80 MG PO TABS
80.0000 mg | ORAL_TABLET | Freq: Every day | ORAL | Status: DC
  Administered 2023-12-08: 80 mg via ORAL
  Filled 2023-12-07: qty 1

## 2023-12-07 MED ORDER — SODIUM CHLORIDE 0.9 % WEIGHT BASED INFUSION
3.0000 mL/kg/h | INTRAVENOUS | Status: DC
  Administered 2023-12-07: 3 mL/kg/h via INTRAVENOUS

## 2023-12-07 MED ORDER — FENTANYL CITRATE (PF) 100 MCG/2ML IJ SOLN
INTRAMUSCULAR | Status: AC
  Filled 2023-12-07: qty 2

## 2023-12-07 MED ORDER — DAPAGLIFLOZIN PROPANEDIOL 10 MG PO TABS
10.0000 mg | ORAL_TABLET | Freq: Every day | ORAL | Status: DC
  Administered 2023-12-08: 10 mg via ORAL
  Filled 2023-12-07: qty 1

## 2023-12-07 MED ORDER — HYDRALAZINE HCL 20 MG/ML IJ SOLN
INTRAMUSCULAR | Status: AC
  Filled 2023-12-07: qty 1

## 2023-12-07 MED ORDER — SODIUM CHLORIDE 0.9 % IV SOLN: INTRAVENOUS | Status: AC

## 2023-12-07 MED ORDER — ACETAMINOPHEN 325 MG PO TABS
650.0000 mg | ORAL_TABLET | ORAL | Status: DC | PRN
  Administered 2023-12-08: 650 mg via ORAL
  Filled 2023-12-07: qty 2

## 2023-12-07 MED ORDER — SODIUM CHLORIDE 0.9% FLUSH
3.0000 mL | Freq: Two times a day (BID) | INTRAVENOUS | Status: DC
Start: 2023-12-07 — End: 2023-12-08
  Administered 2023-12-07 – 2023-12-08 (×3): 3 mL via INTRAVENOUS

## 2023-12-07 MED ORDER — SODIUM CHLORIDE 0.9% FLUSH
3.0000 mL | Freq: Two times a day (BID) | INTRAVENOUS | Status: DC
  Administered 2023-12-08: 3 mL via INTRAVENOUS

## 2023-12-07 MED ORDER — SODIUM CHLORIDE 0.9 % IV SOLN: 250.0000 mL | INTRAVENOUS | Status: DC | PRN

## 2023-12-07 MED ORDER — LABETALOL HCL 5 MG/ML IV SOLN: 10.0000 mg | INTRAVENOUS | Status: AC | PRN

## 2023-12-07 MED ORDER — DILTIAZEM HCL ER COATED BEADS 180 MG PO CP24: 180.0000 mg | ORAL_CAPSULE | Freq: Every day | ORAL | Status: DC

## 2023-12-07 MED ORDER — MIDAZOLAM HCL 2 MG/2ML IJ SOLN
INTRAMUSCULAR | Status: AC
  Filled 2023-12-07: qty 2

## 2023-12-07 MED ORDER — PANTOPRAZOLE SODIUM 40 MG PO TBEC
40.0000 mg | DELAYED_RELEASE_TABLET | Freq: Every day | ORAL | Status: DC
  Administered 2023-12-08: 40 mg via ORAL
  Filled 2023-12-07: qty 1

## 2023-12-07 MED ORDER — ZOLPIDEM TARTRATE 5 MG PO TABS
5.0000 mg | ORAL_TABLET | Freq: Every day | ORAL | Status: DC
  Administered 2023-12-07: 5 mg via ORAL
  Filled 2023-12-07: qty 1

## 2023-12-07 MED ORDER — LABETALOL HCL 5 MG/ML IV SOLN
INTRAVENOUS | Status: DC | PRN
  Administered 2023-12-07: 10 mg via INTRAVENOUS

## 2023-12-07 MED ORDER — APIXABAN 5 MG PO TABS
5.0000 mg | ORAL_TABLET | Freq: Two times a day (BID) | ORAL | Status: DC
Start: 2023-12-08 — End: 2023-12-08
  Administered 2023-12-08: 5 mg via ORAL
  Filled 2023-12-07: qty 1

## 2023-12-07 MED ORDER — HEPARIN SODIUM (PORCINE) 1000 UNIT/ML IJ SOLN
INTRAMUSCULAR | Status: DC | PRN
  Administered 2023-12-07: 3000 [IU] via INTRAVENOUS

## 2023-12-07 MED ORDER — VERAPAMIL HCL 2.5 MG/ML IV SOLN
INTRAVENOUS | Status: DC | PRN
  Administered 2023-12-07: 10 mL via INTRA_ARTERIAL

## 2023-12-07 MED ORDER — ONDANSETRON HCL 4 MG/2ML IJ SOLN: 4.0000 mg | Freq: Four times a day (QID) | INTRAMUSCULAR | Status: DC | PRN

## 2023-12-07 MED ORDER — INSULIN ASPART 100 UNIT/ML IJ SOLN
0.0000 [IU] | Freq: Three times a day (TID) | INTRAMUSCULAR | Status: DC
  Administered 2023-12-07: 8 [IU] via SUBCUTANEOUS
  Administered 2023-12-08: 3 [IU] via SUBCUTANEOUS
  Administered 2023-12-08: 2 [IU] via SUBCUTANEOUS

## 2023-12-07 MED ORDER — SODIUM CHLORIDE 0.9 % IV SOLN: 250.0000 mL | INTRAVENOUS | Status: AC | PRN

## 2023-12-07 MED ORDER — HYDRALAZINE HCL 20 MG/ML IJ SOLN: 10.0000 mg | INTRAMUSCULAR | Status: AC | PRN

## 2023-12-07 MED ORDER — FUROSEMIDE 10 MG/ML IJ SOLN
40.0000 mg | Freq: Every day | INTRAMUSCULAR | Status: DC
  Administered 2023-12-07 – 2023-12-08 (×2): 40 mg via INTRAVENOUS
  Filled 2023-12-07 (×3): qty 4

## 2023-12-07 MED ORDER — SODIUM CHLORIDE 0.9 % WEIGHT BASED INFUSION
1.0000 mL/kg/h | INTRAVENOUS | Status: DC
  Administered 2023-12-07: 1 mL/kg/h via INTRAVENOUS

## 2023-12-07 MED ORDER — VERAPAMIL HCL 2.5 MG/ML IV SOLN
INTRAVENOUS | Status: AC
  Filled 2023-12-07: qty 2

## 2023-12-07 MED ORDER — LIDOCAINE HCL (PF) 1 % IJ SOLN
INTRAMUSCULAR | Status: AC
  Filled 2023-12-07: qty 30

## 2023-12-07 MED ORDER — INSULIN ASPART 100 UNIT/ML IJ SOLN: 0.0000 [IU] | Freq: Three times a day (TID) | INTRAMUSCULAR | Status: DC

## 2023-12-07 MED ORDER — LABETALOL HCL 5 MG/ML IV SOLN
INTRAVENOUS | Status: AC
  Filled 2023-12-07: qty 4

## 2023-12-07 MED ORDER — FENTANYL CITRATE (PF) 100 MCG/2ML IJ SOLN
INTRAMUSCULAR | Status: DC | PRN
  Administered 2023-12-07: 25 ug via INTRAVENOUS

## 2023-12-07 SURGICAL SUPPLY — 8 items
CATH 5FR JL3.5 JR4 ANG PIG MP (CATHETERS) IMPLANT
DEVICE RAD COMP TR BAND LRG (VASCULAR PRODUCTS) IMPLANT
DEVICE RAD TR BAND REGULAR (VASCULAR PRODUCTS) IMPLANT
GLIDESHEATH SLEND SS 6F .021 (SHEATH) IMPLANT
GUIDEWIRE INQWIRE 1.5J.035X260 (WIRE) IMPLANT
INQWIRE 1.5J .035X260CM (WIRE) ×1 IMPLANT
PACK CARDIAC CATHETERIZATION (CUSTOM PROCEDURE TRAY) ×1 IMPLANT
SET ATX-X65L (MISCELLANEOUS) IMPLANT

## 2023-12-07 NOTE — Interval H&P Note (Signed)
 History and Physical Interval Note:  12/07/2023 9:15 AM  [address]  has presented today for surgery, with the diagnosis of angina.  The various methods of treatment have been discussed with the patient and family. After consideration of risks, benefits and other options for treatment, the patient has consented to  Procedure(s): LEFT HEART CATH AND CORONARY ANGIOGRAPHY (N/A) as a surgical intervention.  The patient's history has been reviewed, patient examined, no change in status, stable for surgery.  I have reviewed the patient's chart and labs.  Questions were answered to the patient's satisfaction.    Cath Lab Visit (complete for each Cath Lab visit)  Clinical Evaluation Leading to the Procedure:   ACS: No.  Non-ACS:    Anginal Classification: CCS II  Anti-ischemic medical therapy: Minimal Therapy (1 class of medications)  Non-Invasive Test Results: No non-invasive testing performed  Prior CABG: No previous CABG   Verne Carrow

## 2023-12-07 NOTE — Plan of Care (Signed)
  Problem: Activity: Goal: Ability to return to baseline activity level will improve Outcome: Progressing   Problem: Cardiovascular: Goal: Vascular access site(s) Level 0-1 will be maintained Outcome: Progressing   Problem: Activity: Goal: Risk for activity intolerance will decrease Outcome: Progressing   Problem: Coping: Goal: Level of anxiety will decrease Outcome: Progressing

## 2023-12-07 NOTE — Progress Notes (Signed)
 Heart Failure Navigator Progress Note  Assessed for Heart & Vascular TOC clinic readiness.  Patient does not meet criteria due to EF 60-65%, has a scheduled [facility] appointment on 12/21/2023. No HF TOC. .   Navigator will sign off at this time.   Rhae Hammock, BSN, Scientist, clinical (histocompatibility and immunogenetics) Only

## 2023-12-07 NOTE — Discharge Instructions (Signed)

## 2023-12-07 NOTE — Plan of Care (Signed)
  Problem: Education: Goal: Understanding of CV disease, CV risk reduction, and recovery process will improve Outcome: Progressing   Problem: Cardiovascular: Goal: Ability to achieve and maintain adequate cardiovascular perfusion will improve Outcome: Progressing   

## 2023-12-07 NOTE — H&P (Signed)
 Cardiology Admission History and Physical   Patient ID: Pamela [name] MRN: [id number]; DOB: Jul 08, 1946   Admission date: 12/07/2023  PCP:  Blane Ohara, MD   [facility] Providers Cardiologist:  Thomasene Ripple, DO   {  Chief Complaint:  dyspnea, chest pressure  History of Present Illness:   Pamela [name] is a 78 yo female with history of DM, GERD, protein S deficiency, HTN, HLD, PVCs, PAF, prior TIA with recent chest pain and dyspnea. Coronary CTA in 2022 with minimal CAD. PET CT stress in September 2024 with no ischemia. Pt with exertional dyspnea, fatigue and chest pressure. She was seen by Dr. Servando Salina in the outpatient setting and cardiac cath was arranged. She arrived to the cath lab with a systolic blood pressure of 210 mm Hg. Cardiac cath from the right radial approach with no evidence of CAD. LVEDP 30 mm Hg. Pre-cath labs with sodium of 124 and GFR around 40. Echo in August 2024 with normal LV systolic function, diastolic dysfunction and no valve disease. Pt is being admitted post cath for diuresis with IV lasix.    Past Medical History:  Diagnosis Date   Anticoagulated on Coumadin    chronic--- managed by hematology/ oncology,   Asthma, mild intermittent    followed by pcp--- per pt last exacerbation winter 2021 w/ acute bronchitis   Bilateral leg cramps    Blood dyscrasia    Chronic constipation    CKD (chronic kidney disease), stage III (HCC)    Closed bimalleolar fracture of left ankle 02/26/2020   DDD (degenerative disc disease), cervical    w/ spondylosis,  per pt last steroid injection 06/ 2022   DDD (degenerative disc disease), lumbosacral    DVT (deep venous thrombosis) (HCC) 07/30/2013   Dyspnea    occasionally   Dysrhythmia    GERD (gastroesophageal reflux disease)    History of cardiac murmur as a child    History of DVT of lower extremity    left lower extremity in 1980s, fell when bowling   History of pulmonary embolus (PE) 1993   per  pt left lung post op 2 wks cholecystectomy   History of rheumatic fever as a child    per last echo 12-31-2019 no valvular issues   History of TIA (transient ischemic attack)    2014 and 2018 or 2019,  per pt no residual   HTN (hypertension)    followed by pcp   Hyperlipidemia    Hypothyroidism    IDA (iron deficiency anemia)    Macular degeneration of both eyes    Mild obstructive sleep apnea    per pt dx 2017 tried to uses cpap but intolerant   Mixed incontinence urge and stress    urologist--- dr Saddie Benders   OA (osteoarthritis) 07/06/2018   Osteoporosis    PAF (paroxysmal atrial fibrillation) (HCC) 10/01/2014   cardiologist--- dr Lavona Mound tobb;   cardiac cath 02-18-2013 normal coronaries arteries, ef 50%, cath done since echo showed ef 30-35%; nucleat stress study 04/ 2020 normal , normal echo 04/ 2021,  event monitor 09-14-2020 rare ST/AT variable block   Pneumonia    PONV (postoperative nausea and vomiting)    Protein S deficiency (HCC)    followed by hemotology/ oncology-- dr d. Melvyn Neth ([facility]) dx 1980s;  prior DVT left lower leg 1980s and left lung PE 1993; chronic  coumadin since 1980s   PVC's (premature ventricular contractions)    followed by cardiology   S/P  cardiac catheterization 02/2013   Normal coronaries; low normal EF at 50%   Solitary pulmonary nodule on lung CT 02/06/2019   First noted 01/13/2014 > no change as of 12/21/2018   Spondylolisthesis, lumbar region 08/09/2018   Stroke (HCC)    TIA in 2018 or 2019   Transient ischemic attack 07/30/2013   Type 2 diabetes mellitus treated with insulin (HCC)    endocrilogist--- whitney reardon NP     (03-10-2021  pt continuously checks blood sugar throughout the day w/ Libre, fasting sugar --- 69--200)   Wears glasses    Wears hearing aid in both ears     Past Surgical History:  Procedure Laterality Date   ABDOMINAL HYSTERECTOMY     BLEPHAROPLASTY     CARDIAC CATHETERIZATION  02/18/2013   @ [facility]  by Dr  [place];  normal coronaries w/ preserved LVF, ef 50%;   previous cath 2001 normal ef 65%   CARPAL TUNNEL RELEASE Bilateral 1994   CATARACT EXTRACTION W/ INTRAOCULAR LENS IMPLANT Bilateral 2017   CHOLECYSTECTOMY     CHOLECYSTECTOMY, LAPAROSCOPIC  1993   COLONOSCOPY     EAR BIOPSY Left    FINGER SURGERY Left 2018   thumb   FOOT SURGERY     FOOT TENDON SURGERY Right    early 2000s   FRACTURE SURGERY Left 02/13/2020   left femur   HAND SURGERY Right 04/2022   HARDWARE REMOVAL Left 03/12/2021   Procedure: HARDWARE REMOVAL;  Surgeon: Yolonda Kida, MD;  Location: [facility];  Service: Orthopedics;  Laterality: Left;  60 MINS   JOINT REPLACEMENT     LUMBAR LAMINECTOMY/DECOMPRESSION MICRODISCECTOMY N/A 12/22/2022   Procedure: Lumbar Two-Lumbar Three, Lumbar Three-Lumbar Four Lumbar Decompression;  Surgeon: Barnett Abu, MD;  Location: [facility] OR;  Service: Neurosurgery;  Laterality: N/A;  RM 20 3C   ORIF ANKLE FRACTURE Left 02/26/2020   Procedure: OPEN REDUCTION INTERNAL FIXATION (ORIF) ANKLE FRACTURE;  Surgeon: Yolonda Kida, MD;  Location: [facility];  Service: Orthopedics;  Laterality: Left;  90 mins   TONSILLECTOMY AND ADENOIDECTOMY Bilateral    TOTAL KNEE ARTHROPLASTY Left 03/2005   TOTAL VAGINAL HYSTERECTOMY  1988   per pt still has ovaries     Medications Prior to Admission: Prior to Admission medications   Medication Sig Start Date End Date Taking? Authorizing Provider  acetaminophen (TYLENOL) 650 MG CR tablet Take 650-1,300 mg by mouth every 8 (eight) hours as needed for pain.   Yes [provider]  albuterol (VENTOLIN HFA) 108 (90 Base) MCG/ACT inhaler INHALE 1 TO 2 PUFFS INTO THE LUNGS EVERY 6 HOURS AS NEEDED FOR WHEEZING OR SHORTNESS OF BREATH 02/04/22  Yes Marianne Sofia, PA-C  aspirin EC (ASPIRIN LOW DOSE) 81 MG tablet TAKE 1 TABLET(81 MG) BY MOUTH DAILY 08/02/22  Yes Tobb, Kardie, DO  atorvastatin (LIPITOR) 80 MG tablet TAKE 1 TABLET(80 MG) BY MOUTH  DAILY 08/07/23  Yes Tobb, Kardie, DO  carboxymethylcellulose (REFRESH PLUS) 0.5 % SOLN Place 1 drop into both eyes 3 (three) times daily as needed (dry eyes).   Yes [provider]  Cholecalciferol (VITAMIN D3) 50 MCG (2000 UT) TABS Take 4,000 Units by mouth daily with lunch.   Yes [provider]  Continuous Glucose Sensor (FREESTYLE LIBRE 2 SENSOR) MISC 2 each by Does not apply route every 14 (fourteen) days. E11.40 10/05/23  Yes Shamleffer, Konrad Dolores, MD  cyanocobalamin (VITAMIN B12) 1000 MCG tablet Take 1,000 mcg by mouth daily with lunch.   Yes  [provider]  dapagliflozin propanediol (FARXIGA) 10 MG TABS tablet Take 1 tablet (10 mg total) by mouth daily before breakfast. 10/05/23  Yes Shamleffer, Konrad Dolores, MD  diltiazem (CARDIZEM CD) 180 MG 24 hr capsule Take 1 capsule (180 mg total) by mouth daily. 08/07/23 12/07/23 Yes Tobb, Kardie, DO  Dulaglutide (TRULICITY) 0.75 MG/0.5ML SOAJ Inject 0.75 mg into the skin once a week. 10/05/23  Yes Shamleffer, Konrad Dolores, MD  ELIQUIS 5 MG TABS tablet TAKE 1 TABLET(5 MG) BY MOUTH TWICE DAILY 12/01/23  Yes Mosher, Harvin Hazel A, PA-C  estradiol (ESTRACE) 0.1 MG/GM vaginal cream Place 1 Applicatorful vaginally 2 (two) times a week. As needed 06/10/20  Yes [provider]  ferrous sulfate 325 (65 FE) MG tablet Take 325 mg by mouth daily with lunch.   Yes [provider]  GEMTESA 75 MG TABS Take 75 mg by mouth at bedtime. 11/09/21  Yes [provider]  hydrochlorothiazide (HYDRODIURIL) 12.5 MG tablet Take 12.5 mg by mouth daily with lunch. 09/26/23  Yes [provider]  insulin aspart (NOVOLOG FLEXPEN) 100 UNIT/ML FlexPen Max daily 30 units Patient taking differently: Inject 6 Units into the skin 3 (three) times daily with meals. Sliding scale Max daily 30 units 10/05/23  Yes Shamleffer, Konrad Dolores, MD  isosorbide mononitrate (IMDUR) 30 MG 24 hr tablet TAKE 1 TABLET BY MOUTH DAILY. PLEASE  ARRANGE APPT FOR FURTHER REFILLS 08/07/23  Yes Tobb, Kardie, DO  lisinopril (ZESTRIL) 20 MG tablet TAKE 1 TABLETS BY MOUTH TWICE DAILY 07/17/23  Yes Cox, Kirsten, MD  magnesium oxide (MAG-OX) 400 (240 Mg) MG tablet TAKE 2 TABLETS(800 MG) BY MOUTH TWICE DAILY Patient taking differently: Take 800 mg by mouth 2 (two) times daily. 02/15/21  Yes [name], MD  Multiple Vitamins-Minerals (PRESERVISION AREDS PO) Take 1 capsule by mouth 2 (two) times daily.   Yes [provider]  nystatin cream (MYCOSTATIN) Apply 1 Application topically 4 (four) times daily as needed for dry skin (yeast infection).   Yes [provider]  ondansetron (ZOFRAN-ODT) 8 MG disintegrating tablet Take 8 mg by mouth every 8 (eight) hours as needed for nausea or vomiting.    Yes [provider]  pantoprazole (PROTONIX) 40 MG tablet TAKE 1 TABLET(40 MG) BY MOUTH EVERY MORNING 11/05/23  Yes Cox, Kirsten, MD  pyridoxine (B-6) 100 MG tablet Take 100 mg by mouth daily with lunch.   Yes [provider]  sennosides-docusate sodium (SENOKOT-S) 8.6-50 MG tablet Take 1 tablet by mouth daily as needed for constipation.   Yes [provider]  sodium chloride (OCEAN) 0.65 % SOLN nasal spray Place 1 spray into both nostrils as needed for congestion.   Yes [provider]  zolpidem (AMBIEN) 5 MG tablet TAKE 1 TABLET(5 MG) BY MOUTH AT BEDTIME AS NEEDED FOR SLEEP Patient taking differently: Take 5 mg by mouth at bedtime. 10/14/23  Yes Cox, Kirsten, MD  Insulin Pen Needle (BD PEN NEEDLE NANO 2ND GEN) 32G X 4 MM MISC 1 Device by Other route in the morning, at noon, in the evening, and at bedtime. USE TO INJECT INSULIN four times DAILY 10/05/23   Shamleffer, Konrad Dolores, MD     Allergies:    Allergies  Allergen Reactions   Ketek [Telithromycin] Nausea And Vomiting and Rash   Loxapine Succinate Hives   Macrodantin [Nitrofurantoin Macrocrystal] Other (See Comments)    Pulmonary fibrosis    Naproxen Sodium Anaphylaxis and Hives   Amoxapine And Related Hives  Darvon Nausea And Vomiting   Belsomra [Suvorexant] Other (See Comments)    "Nightmares"   Cymbalta [Duloxetine Hcl] Other (See Comments)    "Blackouts"   Duloxetine    Gabapentin Other (See Comments)    Blurry vision   Lyrica [Pregabalin]     Makes her sedated   Ozempic (0.25 Or 0.5 Mg-Dose) [Semaglutide(0.25 Or 0.5mg -Dos)] Diarrhea   Semaglutide Diarrhea    Rybelsus   Amoxicillin Rash   Propoxyphene Nausea And Vomiting    Social History:   Social History   Socioeconomic History   Marital status: Married    Spouse name: Jomarie Longs   Number of children: Not on file   Years of education: Not on file   Highest education level: Some college, no degree  Occupational History   Not on file  Tobacco Use   Smoking status: Never   Smokeless tobacco: Never  Vaping Use   Vaping status: Never Used  Substance and Sexual Activity   Alcohol use: No   Drug use: Never   Sexual activity: Not on file    Comment: Hysterectomy  Other Topics Concern   Not on file  Social History Narrative   Lives with spouse in [place].  2 grown daughters.   Retired Print production planner     Social Drivers of Corporate investment banker Strain: Low Risk  (10/22/2023)   Overall Financial Resource Strain (CARDIA)    Difficulty of Paying Living Expenses: Not very hard  Food Insecurity: No Food Insecurity (10/22/2023)   Hunger Vital Sign    Worried About Running Out of Food in the Last Year: Never true    Ran Out of Food in the Last Year: Never true  Transportation Needs: No Transportation Needs (10/22/2023)   PRAPARE - Administrator, Civil Service (Medical): No    Lack of Transportation (Non-Medical): No  Physical Activity: Inactive (10/22/2023)   Exercise Vital Sign    Days of Exercise per Week: 0 days    Minutes of Exercise per Session: 30 min  Stress: No Stress Concern Present (10/22/2023)   [employer] of  Occupational Health - Occupational Stress Questionnaire    Feeling of Stress : Only a little  Social Connections: Socially Integrated (10/22/2023)   Social Connection and Isolation Panel [NHANES]    Frequency of Communication with Friends and Family: More than three times a week    Frequency of Social Gatherings with Friends and Family: Patient declined    Attends Religious Services: More than 4 times per year    Active Member of [employer] or Organizations: Patient declined    Attends Engineer, structural: More than 4 times per year    Marital Status: Married  Catering manager Violence: Not At Risk (03/30/2023)   Humiliation, Afraid, Rape, and Kick questionnaire    Fear of Current or Ex-Partner: No    Emotionally Abused: No    Physically Abused: No    Sexually Abused: No    Family History:   The patient's family history includes Antithrombin III deficiency in her mother; Cirrhosis in her mother; Coronary artery disease in her father; Diabetes in her father and sister; Heart attack in her brother, father, and sister; Hypertension in her brother, father, and sister; Kidney disease in her father; Lung cancer in her brother; Protein S deficiency in her daughter; Ulcerative colitis in her mother. There is no history of Stroke, Colitis, Colon polyps, Esophageal cancer, Liver cancer, Pancreatic cancer, Rectal cancer, Stomach cancer, or Breast cancer.  ROS:  Please see the history of present illness.  All other ROS reviewed and negative.     Physical Exam/Data:   Vitals:   12/07/23 1326 12/07/23 1421 12/07/23 1422 12/07/23 1436  BP:  (!) 150/53 (!) 150/53 (!) 150/45  Pulse:  68 71 68  Resp:      Temp:      TempSrc:      SpO2: 100% 97% 98% 97%  Weight:      Height:       No intake or output data in the 24 hours ending 12/07/23 1449    12/07/2023    8:13 AM 12/01/2023    9:43 AM 11/27/2023    9:17 AM  Last 3 Weights  Weight (lbs) 143 lb 6.4 oz 147 lb 143 lb  Weight (kg) 65.046 kg  66.679 kg 64.864 kg     Body mass index is 28.01 kg/m.  General:  Well nourished, well developed, in no acute distress HEENT: normal Neck: no JVD Vascular: No carotid bruits; Distal pulses 2+ bilaterally   Cardiac:  normal S1, S2; RRR; no murmur  Lungs:  clear to auscultation bilaterally, no wheezing, rhonchi or rales  Abd: soft, nontender, no hepatomegaly  Ext: no LE edema Musculoskeletal:  No deformities, BUE and BLE strength normal and equal Skin: warm and dry  Neuro:  CNs 2-12 intact, no focal abnormalities noted Psych:  Normal affect    Relevant CV Studies:   Laboratory Data:  High Sensitivity Troponin:  No results for input(s): "TROPONINIHS" in the last 720 hours.    Chemistry Recent Labs  Lab 12/01/23 1111  NA 124*  K 4.5  CL 88*  CO2 23  GLUCOSE 160*  BUN 24  CREATININE 1.36*  CALCIUM 9.0  MG 2.8*    Recent Labs  Lab 12/01/23 1111  PROT 6.2  ALBUMIN 3.9  AST 30  ALT 34*  ALKPHOS 111  BILITOT 0.5   Lipids No results for input(s): "CHOL", "TRIG", "HDL", "LABVLDL", "LDLCALC", "CHOLHDL" in the last 168 hours. Hematology Recent Labs  Lab 12/01/23 1111  WBC 9.5  RBC 3.89  HGB 12.4  HCT 35.9  MCV 92  MCH 31.9  MCHC 34.5  RDW 13.1  PLT 237   Thyroid No results for input(s): "TSH", "FREET4" in the last 168 hours. BNPNo results for input(s): "BNP", "PROBNP" in the last 168 hours.  DDimer No results for input(s): "DDIMER" in the last 168 hours.   Radiology/Studies:  CARDIAC CATHETERIZATION Result Date: 12/07/2023 No angiographic evidence of CAD LVEDP 27-30 mm Hg Recommendations: She is volume overloaded and has uncontrolled HTN with renal insufficiency and hyponatremia. Will admit to telemetry and begin diuresis with IV Lasix. BMET in am. Follow BP closely and adjust medication as needed.     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 Elevated LVEDP at time of cath. Pre-cath  labs with serum sodium of 124. Creatinine 1.36 with GFR under 40. Will admit for diuresis with IV Lasix. Will follow blood pressure and renal function closely. BMET in am. Echo tomorrow.  2. Hypervolemic hyponatremia: Diuresis as above.    Code Status: Full Code  Severity of Illness: The appropriate patient status for this patient is INPATIENT. Inpatient status is judged to be reasonable and necessary in order to provide the required intensity of service to ensure the patient's safety. The patient's presenting symptoms, physical exam findings, and initial radiographic and laboratory data in the context  of their chronic comorbidities is felt to place them at high risk for further clinical deterioration. Furthermore, it is not anticipated that the patient will be medically stable for discharge from the hospital within 2 midnights of admission.   * I certify that at the point of admission it is my clinical judgment that the patient will require inpatient hospital care spanning beyond 2 midnights from the point of admission due to high intensity of service, high risk for further deterioration and high frequency of surveillance required.*   For questions or updates, please contact [facility] Please consult [email or web address] for contact info under     Signed, Verne Carrow, MD  12/07/2023 2:49 PM

## 2023-12-08 ENCOUNTER — Encounter (HOSPITAL_COMMUNITY): Payer: Self-pay | Admitting: Cardiovascular Disease

## 2023-12-08 ENCOUNTER — Inpatient Hospital Stay (HOSPITAL_COMMUNITY)

## 2023-12-08 DIAGNOSIS — I5031 Acute diastolic (congestive) heart failure: Secondary | ICD-10-CM | POA: Diagnosis not present

## 2023-12-08 DIAGNOSIS — D6859 Other primary thrombophilia: Secondary | ICD-10-CM | POA: Diagnosis not present

## 2023-12-08 DIAGNOSIS — I5041 Acute combined systolic (congestive) and diastolic (congestive) heart failure: Secondary | ICD-10-CM

## 2023-12-08 DIAGNOSIS — I428 Other cardiomyopathies: Secondary | ICD-10-CM

## 2023-12-08 DIAGNOSIS — I5021 Acute systolic (congestive) heart failure: Principal | ICD-10-CM

## 2023-12-08 DIAGNOSIS — I129 Hypertensive chronic kidney disease with stage 1 through stage 4 chronic kidney disease, or unspecified chronic kidney disease: Secondary | ICD-10-CM | POA: Diagnosis not present

## 2023-12-08 DIAGNOSIS — R0609 Other forms of dyspnea: Secondary | ICD-10-CM

## 2023-12-08 DIAGNOSIS — Z7901 Long term (current) use of anticoagulants: Secondary | ICD-10-CM | POA: Diagnosis not present

## 2023-12-08 DIAGNOSIS — R0989 Other specified symptoms and signs involving the circulatory and respiratory systems: Secondary | ICD-10-CM

## 2023-12-08 DIAGNOSIS — I2510 Atherosclerotic heart disease of native coronary artery without angina pectoris: Secondary | ICD-10-CM | POA: Diagnosis not present

## 2023-12-08 DIAGNOSIS — E871 Hypo-osmolality and hyponatremia: Secondary | ICD-10-CM | POA: Diagnosis not present

## 2023-12-08 DIAGNOSIS — D6869 Other thrombophilia: Secondary | ICD-10-CM | POA: Diagnosis not present

## 2023-12-08 DIAGNOSIS — N1831 Chronic kidney disease, stage 3a: Secondary | ICD-10-CM | POA: Diagnosis not present

## 2023-12-08 HISTORY — DX: Acute combined systolic (congestive) and diastolic (congestive) heart failure: I50.41

## 2023-12-08 HISTORY — DX: Other cardiomyopathies: I42.8

## 2023-12-08 LAB — CBC WITH DIFFERENTIAL/PLATELET
Abs Immature Granulocytes: 0.03 10*3/uL (ref 0.00–0.07)
Basophils Absolute: 0 10*3/uL (ref 0.0–0.1)
Basophils Relative: 0 %
Eosinophils Absolute: 0.1 10*3/uL (ref 0.0–0.5)
Eosinophils Relative: 1 %
HCT: 35.8 % — ABNORMAL LOW (ref 36.0–46.0)
Hemoglobin: 12.2 g/dL (ref 12.0–15.0)
Immature Granulocytes: 0 %
Lymphocytes Relative: 21 %
Lymphs Abs: 1.4 10*3/uL (ref 0.7–4.0)
MCH: 31 pg (ref 26.0–34.0)
MCHC: 34.1 g/dL (ref 30.0–36.0)
MCV: 90.9 fL (ref 80.0–100.0)
Monocytes Absolute: 0.9 10*3/uL (ref 0.1–1.0)
Monocytes Relative: 13 %
Neutro Abs: 4.3 10*3/uL (ref 1.7–7.7)
Neutrophils Relative %: 65 %
Platelets: 219 10*3/uL (ref 150–400)
RBC: 3.94 MIL/uL (ref 3.87–5.11)
RDW: 14.6 % (ref 11.5–15.5)
WBC: 6.7 10*3/uL (ref 4.0–10.5)
nRBC: 0 % (ref 0.0–0.2)

## 2023-12-08 LAB — ECHOCARDIOGRAM COMPLETE
AR max vel: 1.99 cm2
AV Area VTI: 2.14 cm2
AV Area mean vel: 1.95 cm2
AV Mean grad: 4 mmHg
AV Peak grad: 6.4 mmHg
Ao pk vel: 1.26 m/s
Area-P 1/2: 4.52 cm2
Height: 61 in
S' Lateral: 4.2 cm
Weight: 2204.6 [oz_av]

## 2023-12-08 LAB — BASIC METABOLIC PANEL WITH GFR
Anion gap: 11 (ref 5–15)
BUN: 23 mg/dL (ref 8–23)
CO2: 26 mmol/L (ref 22–32)
Calcium: 8.9 mg/dL (ref 8.9–10.3)
Chloride: 98 mmol/L (ref 98–111)
Creatinine, Ser: 1.23 mg/dL — ABNORMAL HIGH (ref 0.44–1.00)
GFR, Estimated: 45 mL/min — ABNORMAL LOW (ref >60)
Glucose, Bld: 139 mg/dL — ABNORMAL HIGH (ref 70–99)
Potassium: 4.4 mmol/L (ref 3.5–5.1)
Sodium: 135 mmol/L (ref 135–145)

## 2023-12-08 LAB — GLUCOSE, CAPILLARY
Glucose-Capillary: 133 mg/dL — ABNORMAL HIGH (ref 70–99)
Glucose-Capillary: 165 mg/dL — ABNORMAL HIGH (ref 70–99)

## 2023-12-08 MED ORDER — SPIRONOLACTONE 25 MG PO TABS
25.0000 mg | ORAL_TABLET | Freq: Every day | ORAL | 3 refills | Status: DC
Start: 2023-12-08 — End: 2024-01-04

## 2023-12-08 MED ORDER — SPIRONOLACTONE 25 MG PO TABS
25.0000 mg | ORAL_TABLET | Freq: Every day | ORAL | Status: DC
  Administered 2023-12-08: 25 mg via ORAL
  Filled 2023-12-08: qty 1

## 2023-12-08 NOTE — Care Management Obs Status (Signed)
 MEDICARE OBSERVATION STATUS NOTIFICATION   Patient Details  Name: Pamela [name] MRN: [id number] Date of Birth: Jun 28, 1946   Medicare Observation Status Notification Given:  Yes    Leone Haven, RN 12/08/2023, 2:42 PM

## 2023-12-08 NOTE — TOC Transition Note (Addendum)
 Transition of Care [facility]) - Discharge Note   Patient Details  Name: Pamela [name] MRN: [id number] Date of Birth: 1946/06/18  Transition of Care [facility]) CM/SW Contact:  Leone Haven, RN Phone Number: 12/08/2023, 3:49 PM   Clinical Narrative:    For dc today, per pt eval rec HHPT, NCM offered choice to patient, she chose [place], NCM made referral to [facility] with [facility].  Awaiting to hear back from him to see if he can take referral.  Daughter is at the bedside to transport patient home.  Per Kandee Keen with [facility] he can take referral and soc will begin 24 to 48 hrs post dc.          Patient Goals and CMS Choice            Discharge Placement                       Discharge Plan and Services Additional resources added to the After Visit Summary for                                       Social Drivers of Health (SDOH) Interventions SDOH Screenings   Food Insecurity: No Food Insecurity (12/07/2023)  Housing: Unknown (12/07/2023)  Transportation Needs: No Transportation Needs (12/07/2023)  Utilities: Not At Risk (12/07/2023)  Alcohol Screen: Low Risk  (03/30/2023)  Depression (PHQ2-9): Medium Risk (04/17/2023)  Financial Resource Strain: Low Risk  (10/22/2023)  Physical Activity: Inactive (10/22/2023)  Social Connections: Socially Integrated (12/07/2023)  Stress: No Stress Concern Present (10/22/2023)  Tobacco Use: Low Risk  (12/08/2023)  Health Literacy: Adequate Health Literacy (03/30/2023)     Readmission Risk Interventions    12/08/2023    2:12 PM  Readmission Risk Prevention Plan  Post Dischage Appt Complete  Medication Screening Complete  Transportation Screening Complete

## 2023-12-08 NOTE — Evaluation (Signed)
 Physical Therapy Brief Evaluation and Discharge Note Patient Details Name: Pamela [name] MRN: [id number] DOB: 1946-07-22 Today's Date: 12/08/2023   History of Present Illness  Pt is a 78 y.o. female admitted 12/07/23 for dyspnea and chest pressure. Cath without significant CAD. PMH of  DM, GERD, protein S deficiency, HTN, HLD, PVCs, PAF, and prior TIA.   Clinical Impression  Pt greeted supine in bed, pleasant and agreeable to PT evaluation. She is modI for functional mobility using a SPC and independent with ADLs/IADLs. She lives with her husband in a one story house with a level entry. Pt completed bed mobility and transfers with modI. She ambulated ~172ft using SPC with supervision-CGA. Pt reported lightheadedness during gait and drifted R/L in the hallway demonstrating unsteadiness. Pt appears to be very close to her baseline function. She feels ready and safe for d/c Home. I have answered all her questions related to mobility. Recommend HHPT for balance training and decrease fall risk.     PT Assessment All further PT needs can be met in the next venue of care  Assistance Needed at Discharge  PRN    Equipment Recommendations None recommended by PT (Pt already has DME.)  Recommendations for Other Services       Precautions/Restrictions Precautions Precautions: Fall Recall of Precautions/Restrictions: Intact Restrictions Weight Bearing Restrictions Per Provider Order: No        Mobility  Bed Mobility   Supine/Sidelying to sit: Modified independent (Device/Increased time) Sit to supine/sidelying: Modified independent (Device/Increased time) General bed mobility comments: HOB elevated, knees elevated. No physical assistance.  Transfers Overall transfer level: Modified independent Equipment used: Straight cane               General transfer comment: Pt stood from bed without physical assistance. Good eccentric control with sitting.     Ambulation/Gait Ambulation/Gait assistance: Supervision, Contact guard assist Gait Distance (Feet): 150 Feet Assistive device: Straight cane Gait Pattern/deviations: WFL(Within Functional Limits), Step-through pattern, Decreased stride length Gait Speed: Pace WFL General Gait Details: Pt ambulated with a reciprocal gait pattern, even weight shift, and good foot clearence. She kept SPC in LUE since her heart cath went through the R wrist. Pt reported lightheadness mid-gait and began to drift R/L slight demonstrating slight unsteadiness, increased to CGA for safety. No overt LOB.  Home Activity Instructions Home Activity Instructions: Recommended daily mobility including walks and completeing B UE/LE exercises to maintain ROM/strength.  Stairs            Modified Rankin (Stroke Patients Only)        Balance Overall balance assessment: Needs assistance Sitting-balance support: Single extremity supported, Feet supported Sitting balance-Leahy Scale: Fair Sitting balance - Comments: Pt sat EOB with supervision. She reached in/out of her BOS without LOB. Don/Doffing shoes and socks.   Standing balance support: Single extremity supported, During functional activity, Reliant on assistive device for balance Standing balance-Leahy Scale: Poor Standing balance comment: Pt dependent on SPC during gait. Slight unsteadiness observed with onset of lightheadedness requiring CGA for safety and increased stability.          Pertinent Vitals/Pain PT - Brief Vital Signs All Vital Signs Stable: Yes Pain Assessment Pain Assessment: No/denies pain     Home Living Family/patient expects to be discharged to:: Private residence Living Arrangements: Spouse/significant other Available Help at Discharge: Family;Available 24 hours/day Home Environment: Level entry   Home Equipment: Agricultural consultant (2 wheels);Cane - single point;Grab bars - toilet;Grab bars - tub/shower;Hand held shower  head;Wheelchair - manual;Shower seat - built in        Prior Function Level of Independence: Independent with assistive device(s) Comments: Ambulates with SPC. Denies fall history in the last 48mo. Retired. Indep with ADLs/IADLs.    UE/LE Assessment   UE ROM/Strength/Tone/Coordination: WFL    LE ROM/Strength/Tone/Coordination: [facility]      Communication   Communication Communication: No apparent difficulties     Cognition Overall Cognitive Status: Appears within functional limits for tasks assessed/performed       General Comments General comments (skin integrity, edema, etc.): Post-ambulation: BP 152/70 (94), HR 77.    Exercises     Assessment/Plan    PT Problem List Decreased balance;Decreased mobility       PT Visit Diagnosis Unsteadiness on feet (R26.81);Difficulty in walking, not elsewhere classified (R26.2)    No Skilled PT     Co-evaluation                AMPAC 6 Clicks Help needed turning from your back to your side while in a flat bed without using bedrails?: None Help needed moving from lying on your back to sitting on the side of a flat bed without using bedrails?: None Help needed moving to and from a bed to a chair (including a wheelchair)?: None Help needed standing up from a chair using your arms (e.g., wheelchair or bedside chair)?: None Help needed to walk in hospital room?: A Little Help needed climbing 3-5 steps with a railing? : A Little 6 Click Score: 22      End of Session Equipment Utilized During Treatment: Gait belt Activity Tolerance: Patient tolerated treatment well Patient left: in bed;with call bell/phone within reach Nurse Communication: Mobility status PT Visit Diagnosis: Unsteadiness on feet (R26.81);Difficulty in walking, not elsewhere classified (R26.2)     Time: 1610-9604 PT Time Calculation (min) (ACUTE ONLY): 17 min  Charges:   PT Evaluation $PT Eval Low Complexity: 1 Low      Cheri Guppy, PT, DPT Acute  Rehabilitation Services Office: [phone] Secure Chat Preferred  [name]  12/08/2023, 3:27 PM

## 2023-12-08 NOTE — Progress Notes (Signed)
 Reviewed AVS, patient expressed understanding of medications, MD follow up reviewed.   Removed IV, Site clean, dry and intact.  Patient states all belongings brought to the hospital at time of admission are accounted for and packed to take home.  Pt transported to entrance A where family member was waiting in vehicle to transport home.

## 2023-12-08 NOTE — TOC CM/SW Note (Addendum)
 Transition of Care [facility]) - Inpatient Brief Assessment   Patient Details  Name: Pamela [name] MRN: [id number] Date of Birth: [birth year]  Transition of Care [facility]) CM/SW Contact:    Leone Haven, RN Phone Number: 12/08/2023, 2:14 PM   Clinical Narrative: From home with spouse, has PCP and insurance on file, states has no HH services in place at this time , has cane and walker  at home.  States family member will transport them home at [employer] and family is support system, states gets medications from [facility] [address] [place].  Pta self ambulatory with cane or walker.  Await pt eval.  Patient gives this NCM permission to speak with her spouse.   Transition of Care Asessment: Insurance and Status: Insurance coverage has been reviewed Patient has primary care physician: Yes Home environment has been reviewed: home with spouse Prior level of function:: indep with walker or cane Prior/Current Home Services: No current home services Social Drivers of Health Review: SDOH reviewed no interventions necessary Readmission risk has been reviewed: Yes Transition of care needs: no transition of care needs at this time

## 2023-12-08 NOTE — Progress Notes (Unsigned)
 Cardiology Office Note:  .   Date:  12/08/2023  ID:  Pamela [name], DOB 16-Dec-1945, MRN [id number] PCP: Blane Ohara, MD  [facility] Providers Cardiologist:  Thomasene Ripple, DO   History of Present Illness: .   Pamela [name] is a 78 y.o. female with a past medical history of CAD, chronic diastolic heart failure, PAF, HTN, HLD, orthostatic hypotension, history of TIA, type 2 DM, hypothyroidism, CKD, protein S deficiency. Patient is followed by Dr. Servando Salina and presents today for a hospital follow up appointment   Per chart review, patient was first evaluated by Dr. Johney Frame in 05/2011 for PVCs, palpitations. She reported a 2-3 year history of palpitations, PCP ordered event monitor that showed PVCs and NSVT. She underwent nuclear stress test that was a low risk study without ischemia. Echocardiogram showed normal LV function, grade II DD, no valvular abnormalities. She was seen again in 01/2013 for increased palpitations. Underwent echocardiogram that showed EF 30-35% with diffuse hypokinesis, grade I DD, mild MR. Underwent LHC that showed normal coronary arteries, low normal EF.   Patient later established care with Dr. Servando Salina in 11/2019. She complained of orthostatic hypotension, shortness of breath, and palpitations. Echocardiogram on 12/31/19 showed EF 60-65%, no regional wall motion abnormalities, moderate LVH, grade I DD, normal RV systolic function, normal PA systolic pressure. Cardiac monitor in 09/2020 showed rare supraventricular tachycardia, likely atrial tachycardia with variable block. Coronary CTA in 09/2020 showed a coronary calcium score of 20.4 (39th percentile), minimal CAD.   In 07/2022, patient complained of bilateral leg cramping. ABIs were normal bilaterally.   Patient Was seen  in 03/2023 for evaluation of increased palpitations, chest pain. She underwent echocardiogram on 04/25/23 that showed EF 60-65%, no regional wall motion abnormalities, moderate  asymmetric LVH, grade I DD, normal RV systolic function, no significant valvular abnormalities. Underwent cardiac PET 05/16/23 that showed normal perfusion images, no high risk findings such eas TID or drop in EF with stress. Overall, study was low risk. Patient also worse a cardiac monitor in 07/2023 that showed evidence of symptomatic nonsustained ventricular tachycardia and symptomatic frequent PACs.   Patient was seen by Dr. Servando Salina in clinic on 12/01/23. Patient reported chest discomfort, shortness of breath on exertion. She was scheduled for a left heart catheterization on 12/07/23. Catheterization showed no angiographic evidence of CAD. Her LVEDP was elevated to 27-30 mmHg. Overall, she was volume overloaded and had uncontrolled HTN with renal insufficiency and hyponatremia. She was admitted for IV diuresis. Underwent echocardiogram on 12/08/23 that showed EF 45-50%, no regional wall motion abnormalities, mild LVH, grade I DD, normal RV systolic function, mild MR. Patient was discharged on 12/08/23 on aspirin 81 mg daily, lipitor 80 mg daily, farxiga 10 mg daily, diltiazem 180 mg daily, eliquis 5 mg BID, imdur 30 mg daily, lisinopril 20 mg BID, spironolactone 25 mg daily.   Today, patient presents for a hospital follow up appointment. Reports she is doing well from a cardiac standpoint. Has some complaints of her fingers locking up and sharp pain in the back of her neck. The patient describes the finger locking as painful and difficult to open up, mainly affecting two fingers on her right hand. She also experiences a sharp pain that runs up the back of her neck on each side, which does not last long. Triggered by her turning her head quickly. The patient has a history of arthritis and osteoporosis, which could be contributing to these symptoms. I encouraged patient to follow  up with her PCP to address these concerns   Additionally, the patient reports episodes of breaking out in a cold sweat and feeling nauseous  and dizzy. These episodes are often triggered by standing up quickly or bending over. The patient also mentions feeling lightheaded during these episodes but denies any feelings of passing out.  The patient also has a history of frequent UTIs, occurring every couple of months. She is currently on multiple medications, including aspirin, Eliquis, and a new medication, spironolactone. The patient has not noticed any issues with bleeding on her blood thinner.  ROS: Denies chest pain, shortness of breath, lower extremity swelling. Has pain in her fingers and neck   Studies Reviewed: .   Cardiac Studies & Procedures   ______________________________________________________________________________________________ CARDIAC CATHETERIZATION  CARDIAC CATHETERIZATION 12/07/2023  Narrative No angiographic evidence of CAD LVEDP 27-30 mm Hg  Recommendations: She is volume overloaded and has uncontrolled HTN with renal insufficiency and hyponatremia. Will admit to telemetry and begin diuresis with IV Lasix. BMET in am. Follow BP closely and adjust medication as needed.  Findings Coronary Findings Diagnostic  Dominance: Right  Left Anterior Descending Vessel is large.  Left Circumflex Vessel is large.  Right Coronary Artery Vessel is large.  Intervention  No interventions have been documented.   STRESS TESTS  NM PET CT CARDIAC PERFUSION MULTI W/ABSOLUTE BLOODFLOW 05/16/2023  Narrative   Normal perfusion images.  Myocardial blood flow reserve is decreased, but this is due to high resting flows (stress flows are normal); this could represent microvascular disease.  No other high risk findings such as TID or drop in EF with stress. Study is low risk   LV perfusion is normal. There is no evidence of ischemia. There is no evidence of infarction.   Rest left ventricular function is normal. Rest EF: 54%. Stress left ventricular function is normal. Stress EF: 65%. End diastolic cavity size is normal.  End systolic cavity size is normal.   Myocardial blood flow was computed to be 1.52ml/g/min at rest and 2.21ml/g/min at stress. Global myocardial blood flow reserve was 1.48 and was abnormal.   Coronary calcium was present on the attenuation correction CT images. Mild coronary calcifications were present. Coronary calcifications were present in the left anterior descending artery distribution(s).   Findings are consistent with no ischemia. The study is low risk.   Electronically signed by Epifanio Lesches, MD  CLINICAL DATA:  This over-read does not include interpretation of cardiac or coronary anatomy or pathology. The Cardiac PET CT interpretation by the cardiologist is attached.  COMPARISON:  CTA chest dated 06/06/2022  FINDINGS: Vascular: No evidence of thoracic aortic aneurysm. Atherosclerotic calcifications of the aortic arch.  The heart is top normal in size.  No pericardial effusion.  Mild coronary atherosclerosis of the LAD and left circumflex.  Mediastinum/Nodes: No suspicious mediastinal lymphadenopathy.  Lungs/Pleura: Mild platelike scarring/atelectasis in the right upper lobe. Mild lingular scarring/atelectasis.  Mild subpleural reticulation/fibrosis in the bilateral lower lobes, suggesting mild superimposed chronic interstitial lung disease.  No focal consolidation.  No suspicious pulmonary nodules.  No pleural effusion or pneumothorax.  Upper Abdomen: Visualized upper abdomen is notable for suspected prior cholecystectomy and vascular calcifications.  Musculoskeletal: Degenerative changes of the thoracic spine.  IMPRESSION: Mild subpleural reticulation/fibrosis in the bilateral lower lobes, suggesting mild chronic interstitial lung disease.  Otherwise, no significant extracardiac findings.  Aortic Atherosclerosis (ICD10-I70.0).   Electronically Signed By: Charline Bills M.D. On: 05/16/2023 10:23   ECHOCARDIOGRAM  ECHOCARDIOGRAM COMPLETE  12/08/2023  Narrative ECHOCARDIOGRAM REPORT    Patient Name:   ERMIE GLENDENNING Speas Date of Exam: 12/08/2023 Medical Rec #:  [id number]                    Height:       61.0 in Accession #:    [id number]                   Weight:       137.8 lb Date of Birth:  February 18, 1946                    BSA:          1.612 m Patient Age:    78 years                     BP:           133/65 mmHg Patient Gender: F                            HR:           80 bpm. Exam Location:  Inpatient  Procedure: 2D Echo, Cardiac Doppler and Color Doppler (Both Spectral and Color Flow Doppler were utilized during procedure).  Indications:     CHF-Acute Diastolic I50.31  History:         Patient has prior history of Echocardiogram examinations, most recent 04/25/2023. Stroke; Risk Factors:Hypertension and Diabetes.  Sonographer:     Darlys Gales Referring Phys:  [id number] Kathleene Hazel Diagnosing Phys: Arvilla Meres MD  IMPRESSIONS   1. Left ventricular ejection fraction, by estimation, is 45 to 50%. The left ventricle has mildly decreased function. The left ventricle has no regional wall motion abnormalities. The left ventricular internal cavity size was mildly to moderately dilated. There is mild concentric left ventricular hypertrophy. Left ventricular diastolic parameters are consistent with Grade I diastolic dysfunction (impaired relaxation). 2. Right ventricular systolic function is normal. The right ventricular size is normal. 3. Left atrial size was moderately dilated. 4. The mitral valve is degenerative. Mild mitral valve regurgitation. No evidence of mitral stenosis. 5. The aortic valve is tricuspid. There is mild calcification of the aortic valve. Aortic valve regurgitation is not visualized. Aortic valve sclerosis/calcification is present, without any evidence of aortic stenosis.  FINDINGS Left Ventricle: Left ventricular ejection fraction, by estimation, is 45 to 50%. The left ventricle  has mildly decreased function. The left ventricle has no regional wall motion abnormalities. The left ventricular internal cavity size was mildly to moderately dilated. There is mild concentric left ventricular hypertrophy. Left ventricular diastolic parameters are consistent with Grade I diastolic dysfunction (impaired relaxation).  Right Ventricle: The right ventricular size is normal. No increase in right ventricular wall thickness. Right ventricular systolic function is normal.  Left Atrium: Left atrial size was moderately dilated.  Right Atrium: Right atrial size was normal in size.  Pericardium: There is no evidence of pericardial effusion.  Mitral Valve: The mitral valve is degenerative in appearance. Mild mitral annular calcification. Mild mitral valve regurgitation. No evidence of mitral valve stenosis.  Tricuspid Valve: The tricuspid valve is normal in structure. Tricuspid valve regurgitation is trivial. No evidence of tricuspid stenosis.  Aortic Valve: The aortic valve is tricuspid. There is mild calcification of the aortic valve. Aortic valve regurgitation is not visualized. Aortic valve sclerosis/calcification is present, without any evidence of aortic stenosis. Aortic valve mean  gradient measures 4.0 mmHg. Aortic valve peak gradient measures 6.4 mmHg. Aortic valve area, by VTI measures 2.14 cm.  Pulmonic Valve: The pulmonic valve was normal in structure. Pulmonic valve regurgitation is trivial. No evidence of pulmonic stenosis.  Aorta: The aortic root is normal in size and structure.  Venous: The inferior vena cava was not well visualized.  IAS/Shunts: No atrial level shunt detected by color flow Doppler.   LEFT VENTRICLE PLAX 2D LVIDd:         5.60 cm   Diastology LVIDs:         4.20 cm   LV e' medial:    2.94 cm/s LV PW:         1.00 cm   LV E/e' medial:  20.7 LV IVS:        0.90 cm   LV e' lateral:   4.57 cm/s LVOT diam:     1.90 cm   LV E/e' lateral: 13.3 LV SV:          60 LV SV Index:   37 LVOT Area:     2.84 cm   RIGHT VENTRICLE RV S prime:     21.00 cm/s TAPSE (M-mode): 2.2 cm  LEFT ATRIUM             Index        RIGHT ATRIUM          Index LA Vol (A2C):   60.5 ml 37.53 ml/m  RA Area:     9.27 cm LA Vol (A4C):   48.1 ml 29.84 ml/m  RA Volume:   17.10 ml 10.61 ml/m LA Biplane Vol: 55.9 ml 34.67 ml/m AORTIC VALVE AV Area (Vmax):    1.99 cm AV Area (Vmean):   1.95 cm AV Area (VTI):     2.14 cm AV Vmax:           126.00 cm/s AV Vmean:          93.200 cm/s AV VTI:            0.280 m AV Peak Grad:      6.4 mmHg AV Mean Grad:      4.0 mmHg LVOT Vmax:         88.40 cm/s LVOT Vmean:        64.200 cm/s LVOT VTI:          0.211 m LVOT/AV VTI ratio: 0.75  AORTA Ao Root diam: 2.20 cm  MITRAL VALVE MV Area (PHT): 4.52 cm     SHUNTS MV Decel Time: 168 msec     Systemic VTI:  0.21 m MV E velocity: 60.80 cm/s   Systemic Diam: 1.90 cm MV A velocity: 111.00 cm/s MV E/A ratio:  0.55  Arvilla Meres MD Electronically signed by Arvilla Meres MD Signature Date/Time: 12/08/2023/10:46:06 AM    Final (Updated)    MONITORS  LONG TERM MONITOR (3-14 DAYS) 07/13/2023  Narrative Patch Wear Time:  7 days and 21 hours (2024-10-25T12:46:10-398 to 2024-11-02T09:55:35-0400)  Patient had a min HR of 46 bpm, max HR of 184 bpm, and avg HR of 73 bpm. Predominant underlying rhythm was Sinus Rhythm.  13 Ventricular Tachycardia runs occurred, the run with the fastest interval lasting 5 beats with a max rate of 184 bpm, the longest lasting 6 beats with an avg rate of 111 bpm.  9 Supraventricular Tachycardia runs occurred, the run with the fastest interval lasting 5 beats with a max rate of 174 bpm, the longest lasting 9 beats  with an avg rate of 125 bpm. Isolated SVEs were rare (<1.0%), SVE Couplets were rare (<1.0%), and SVE Triplets were rare (<1.0%). Isolated VEs were frequent (21.5%, [id number]), VE Couplets were occasional (1.6%, 6942), and VE  Triplets were rare (<1.0%, 1704). Ventricular Bigeminy and Trigeminy were present.  Symptoms associated with premature ventricular complexes.  Conclusion; The study showed evidence of symptomatic nonsustained ventricular tachycardia and symptomatic frequent premature atrial complexes.   CT SCANS  CT CORONARY MORPH W/CTA COR W/SCORE 10/02/2020  Addendum 10/02/2020  9:53 PM ADDENDUM REPORT: 10/02/2020 21:51  CLINICAL DATA:  This is a 78 year old female with chest pain and shortness of breath.  EXAM: Cardiac/Coronary  CT  TECHNIQUE: The patient was scanned on a [employer].  FINDINGS: A 120 kV retrospective scan was triggered in the descending thoracic aorta at 111 HU's. Axial non-contrast 3 mm slices were carried out through the heart. The data set was analyzed on a dedicated work station and scored using the Agatson method. Gantry rotation speed was 250 msecs and collimation was .6 mm. No beta blockade and 0.8 mg of sl NTG was given. The 3D data set was reconstructed in 5% intervals of the 67-82 % of the R-R cycle. Diastolic phases were analyzed on a dedicated work station using MPR, MIP and VRT modes. The patient received 80 cc of contrast.  Aorta: Normal size. Mild proximal ascending and descending aorta calcifications. No dissection.  Aortic Valve:  Trileaflet.  No calcifications.  Coronary calcium score - 20.4  Coronary Arteries:  Normal coronary origin.  Right dominance.  RCA is a large dominant artery that gives rise to PDA and PLVB. There is no plaque.  Left main is a large artery that gives rise to LAD and LCX arteries.  LAD is a large vessel. There is minimal (<24%) CAD. There is no plaques in the mid or distal LAD.  LCX is a non-dominant artery that gives rise to one large OM1 branch. There is no plaque.  Other findings:  Normal pulmonary vein drainage into the left atrium.  Normal left atrial appendage without a thrombus.  Normal size of  the pulmonary artery.  Mild Mitral annular calcification.  IMPRESSION: 1. Coronary calcium score of 20.4. This was 77 percentile for age and sex matched control.  2. Normal coronary origin with right dominance.  3. Minimal CAD. CADRADS 1. Aggressive medical therapy is recommended.  4. Mild mitral annular calcification.  5. Aortic Atherosclerosis.  Thomasene Ripple, DO   Electronically Signed By: Thomasene Ripple DO On: 10/02/2020 21:51  Narrative EXAM: OVER-READ INTERPRETATION  CT CHEST  The following report is an over-read performed by radiologist Dr. Trudie Reed of [facility] on 10/02/2020. This over-read does not include interpretation of cardiac or coronary anatomy or pathology. The coronary calcium score/coronary CTA interpretation by the cardiologist is attached.  COMPARISON:  None.  FINDINGS: Aortic atherosclerosis. Within the visualized portions of the thorax there are no suspicious appearing pulmonary nodules or masses, there is no acute consolidative airspace disease, no pleural effusions, no pneumothorax and no lymphadenopathy. Visualized portions of the upper abdomen are unremarkable. There are no aggressive appearing lytic or blastic lesions noted in the visualized portions of the skeleton.  IMPRESSION: 1.  Aortic Atherosclerosis (ICD10-I70.0).  Electronically Signed: By: Trudie Reed M.D. On: 10/02/2020 09:43     ______________________________________________________________________________________________      Risk Assessment/Calculations:            Physical Exam:   VS:  There were no vitals taken for this visit.   Wt Readings from Last 3 Encounters:  12/08/23 137 lb 12.6 oz (62.5 kg)  12/01/23 147 lb (66.7 kg)  11/27/23 143 lb (64.9 kg)    GEN: Thin elderly female. Sitting comfortably in the chair in no acute distress  NECK: No JVD CARDIAC:  RRR, no murmurs, rubs, gallops. Radial pulses 2+ bilaterally. Right radial  cath site is soft, nontender. No bruising or bleeding on exam.  RESPIRATORY:  Fine crackles in bilateral lung bases. Normal work of breathing on room air  ABDOMEN: Soft, non-tender, non-distended EXTREMITIES:  No edema in BLE; No deformity   ASSESSMENT AND PLAN: .    Non-cardiac chest pain  DOE  - When seen by Dr. Servando Salina in 11/2023, she reported chest pain and shortness of breath on exertion. She was set up for cardiac catheterization on 12/07/23, showed no angiographic evidence of CAD, LVEDP 27-30 mmHg  - It is felt that patient's symptoms were due to elevated LVEDP, elevated BP  - Now on spironolactone 25 mg daily, farxiga 10 mg daily for volume control  - Patient euvolemic on exam today and denies shortness of breath or chest pain. Discussed adding PRN lasix. However, patient continues to have some orthostatic symptoms at home and has quite a long medication list. She is doing well on her current regiment, so will hold off on PRN lasix for now  - From a cardiac perspective, with her lack of CAD she does not need to be on aspirin. She follows with hematology for protein S deficiency- she reports that they want her to continue ASA 81 mg daily   Nonischemic Cardiomyopathy  Chronic HFmrEF  - Echocardiogram from 12/08/23 showed EF 45-50%, no regional wall motion abnormalities, mild concentric LVH, grade I DD, normal RV systolic function  - As above, cath from 12/2022 was without cad  - During recent admission, considered transitioning from lisinopril to entresto. However, patient is very concerned about orthostatic hypotension. Remained on lisinopril  - BP well controlled, patient euvolemic on exam  - Continue lisinopril 20 mg BID  - Continue farxiga 10 mg daily- patient reports having a UTI once every few months. Does not have yeast infections. She has CHF, diabetes, CKD. Discussed with patient, I suspect her benefits from farxiga outweigh the possibility that farxiga is contributing to UTIs.  -  Continue spironolactone 25 mg daily   Labile HTN  - Patient has a history of intermittent orthostatic hypotension  - BP log since her hospital discharged shows BP overall well controlled for her age and frailty - SBP in the 130s-150s, diastolic in the 50s-60s - Continue diltiazem 180 mg daily, imdur 30 mg daily, lisinopril 20 mg BID, spironolactone 25 mg daily  - Patient has appointment with PCP next week- BMP will be collected at that time   HLD  - Lipid panel from 10/2023 showed LDL 52, HDL 71, triglycerides 61, total cholesterol 136  - Continue lipitor 80 mg daily   CKD stage IIIa  - Followed by nephrology. Creatinine 1.23 on 4/4 - Seeing PCP next week for BMP   Protein S deficiency  - Followed by hematology  - Continue eliquis 5 mg BID, ASA 81 mg daily (per hematology recs). Denies bleeding concerns   Possible PAF history  - Noted on chart review, but is not well documented. Has not had afib noted on cardiac monitors  - On eliquis as above for Protein S deficiency    Dispo:  Follow up as scheduled with Dr. Servando Salina in 03/2024   Signed, Jonita Albee, PA-C

## 2023-12-08 NOTE — Discharge Summary (Signed)
 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 Will admit to telemetry and begin diuresis with IV Lasix. BMET in am. Follow BP closely and adjust medication as needed.  2D echo 12/08/23  1. Left ventricular ejection fraction, by estimation, is 45 to 50%. The left ventricle has mildly decreased function. The left ventricle has no  regional wall motion abnormalities. The left ventricular internal cavity size was mildly to moderately dilated. There is mild concentric left ventricular hypertrophy. Left ventricular diastolic parameters are consistent with Grade I diastolic dysfunction (impaired relaxation).   2. Right ventricular systolic function is normal. The right ventricular size is normal.   3. Left atrial size was moderately dilated.   4. The mitral valve is degenerative. Mild mitral valve regurgitation. No evidence of mitral stenosis.   5. The aortic valve is tricuspid. There is mild calcification of the aortic valve. Aortic valve regurgitation is not visualized. Aortic valve sclerosis/calcification is present, without any evidence of aortic stenosis.  _____________   History of Present Illness     Pamela [name] Pamela [name] is a 78 y.o. female with  history of DM, GERD, CKD 3a, protein S deficiency on Eliquis, HTN, HLD, PVCs, PAF, prior TIA who presented to [facility] for planned cardiac catheterization. Coronary CTA in 2022 with minimal CAD. PET CT stress in September 2024 showed no ischemia. She recently saw Dr. Servando Salina with complaints of exertional dyspnea, fatigue and chest pressure and outpatient cardiac cath was arranged. She arrived to the cath lab with a systolic blood pressure of 210 mm Hg. Cardiac cath from the right radial approach showed no evidence of CAD. LVEDP was elevated at 30 mm Hg suggestive of volume overload. Pre-cath labs with sodium of 124 and GFR around 40. She was admitted post-cath for diuresis with IV Lasix.  Hospital Course     The patient received IV Lasix last night as well as this AM with symptomatic improvement. AM labs demonstrated normal sodium level at 135, raising question of whether pre-cath sodium was spurious value since previous values were also normal as well. Blood pressure the last 18 hours has ranged from 116/50 to 166/65. 2D echo showed reduction in EF from prior to 45-50% (previously 60-65% in 04/2023), mild LVH, no RMWA, mild-moderate LV dilatation, G1DD, moderate LAE, mild MR. She was started on spironolactone 25mg  daily with plan for close follow-up to determine whether she may need a loop diuretic going forward. Spironolactone will take the place of her PTA hydrochlorothiazide. We moved up her follow-up appointment from later in April to 12/14/23. She will also need f/u BMET at that visit.  Please see Dr. Di Kindle note from today for extensive discussion surrounding blood thinner therapy, episodic low blood pressures at home, different classes of heart failure  medication. Dr. Cristal Deer shared concern that based on information here, Pamela [name] may be having more high blood pressures than she realizes. However, given the patient concerns of periodic hypotension at home and fairly wide variation in BP here, her  lisinopril was not changed to [place]. Dr. Cristal Deer also acknowledged the patient had mentioned to Dr. Servando Salina having recurrent UTIs; she recommended to continue SGLT2i for now with plan for close follow-up as discussed. Dr. Cristal Deer also reports that from from cardiology standpoint the patient does not need aspirin but she recommended to defer this decision to hematology since they also manage her anticoagulation. She was seen by PT per patient/family request who felt that she was very close to her baseline function, that she felt ready and safe for D/C home. HHPT was recommended for balance training and decreased fall risk.  Dr. Cristal Deer has seen and examined the patient today and feels she is stable for discharge. Pt requested new rx be sent to [facility].  Did the patient have an acute coronary syndrome (MI, NSTEMI, STEMI, etc) this admission?:  No                               Did the patient have a percutaneous coronary intervention (stent / angioplasty)?:  No.      _____________  Discharge Vitals Blood pressure (!) 152/70, pulse 83, temperature 97.9 F (36.6 C), temperature source Oral, resp. rate 16, height 5\' 1"  (1.549 m), weight 62.5 kg, SpO2 98%.  Filed Weights   12/07/23 0813 12/07/23 1516 12/08/23 0533  Weight: 65 kg 64.5 kg 62.5 kg   Labs & Radiologic Studies    CBC Recent Labs    12/07/23 1529 12/08/23 0314  WBC 7.2 6.7  NEUTROABS  --  4.3  HGB 12.4 12.2  HCT 36.5 35.8*  MCV 91.3 90.9  PLT 223 219   Basic Metabolic Panel Recent Labs    51/88/41 1529 12/08/23 0314  NA  --  135  K  --  4.4  CL  --  98  CO2  --  26  GLUCOSE  --  139*  BUN  --  23  CREATININE 1.12* 1.23*  CALCIUM  --  8.9   _____________  ECHOCARDIOGRAM COMPLETE Result Date: 12/08/2023    ECHOCARDIOGRAM REPORT   Patient Name:   Pamela [name] Date of Exam: 12/08/2023 Medical Rec #:  [id number]                    Height:       61.0 in Accession #:    [id number]                    Weight:       137.8 lb Date of Birth:  [birth year]                    BSA:          1.612 m Patient Age:    78 years                     BP:           133/65 mmHg Patient Gender: F                            HR:  80 bpm. Exam Location:  Inpatient Procedure: 2D Echo, Cardiac Doppler and Color Doppler (Both Spectral and Color            Flow Doppler were utilized during procedure). Indications:     CHF-Acute Diastolic I50.31  History:         Patient has prior history of Echocardiogram examinations, most                  recent 04/25/2023. Stroke; Risk Factors:Hypertension and                  Diabetes.  Sonographer:     Darlys Gales Referring Phys:  [id number] Kathleene Hazel Diagnosing Phys: Arvilla Meres MD IMPRESSIONS  1. Left ventricular ejection fraction, by estimation, is 45 to 50%. The left ventricle has mildly decreased function. The left ventricle has no regional wall motion abnormalities. The left ventricular internal cavity size was mildly to moderately dilated. There is mild concentric left ventricular hypertrophy. Left ventricular diastolic parameters are consistent with Grade I diastolic dysfunction (impaired relaxation).  2. Right ventricular systolic function is normal. The right ventricular size is normal.  3. Left atrial size was moderately dilated.  4. The mitral valve is degenerative. Mild mitral valve regurgitation. No evidence of mitral stenosis.  5. The aortic valve is tricuspid. There is mild calcification of the aortic valve. Aortic valve regurgitation is not visualized. Aortic valve sclerosis/calcification is present, without any evidence of aortic stenosis. FINDINGS  Left Ventricle: Left ventricular ejection fraction, by estimation, is 45 to 50%. The left ventricle has mildly decreased function. The left ventricle has no regional wall motion abnormalities. The left ventricular internal cavity size was mildly to moderately dilated. There is mild concentric left ventricular  hypertrophy. Left ventricular diastolic parameters are consistent with Grade I diastolic dysfunction (impaired relaxation). Right Ventricle: The right ventricular size is normal. No increase in right ventricular wall thickness. Right ventricular systolic function is normal. Left Atrium: Left atrial size was moderately dilated. Right Atrium: Right atrial size was normal in size. Pericardium: There is no evidence of pericardial effusion. Mitral Valve: The mitral valve is degenerative in appearance. Mild mitral annular calcification. Mild mitral valve regurgitation. No evidence of mitral valve stenosis. Tricuspid Valve: The tricuspid valve is normal in structure. Tricuspid valve regurgitation is trivial. No evidence of tricuspid stenosis. Aortic Valve: The aortic valve is tricuspid. There is mild calcification of the aortic valve. Aortic valve regurgitation is not visualized. Aortic valve sclerosis/calcification is present, without any evidence of aortic stenosis. Aortic valve mean gradient measures 4.0 mmHg. Aortic valve peak gradient measures 6.4 mmHg. Aortic valve area, by VTI measures 2.14 cm. Pulmonic Valve: The pulmonic valve was normal in structure. Pulmonic valve regurgitation is trivial. No evidence of pulmonic stenosis. Aorta: The aortic root is normal in size and structure. Venous: The inferior vena cava was not well visualized. IAS/Shunts: No atrial level shunt detected by color flow Doppler.  LEFT VENTRICLE PLAX 2D LVIDd:         5.60 cm   Diastology LVIDs:         4.20 cm   LV e' medial:    2.94 cm/s LV PW:         1.00 cm   LV E/e' medial:  20.7 LV IVS:        0.90 cm   LV e' lateral:   4.57 cm/s LVOT diam:     1.90 cm   LV E/e' lateral: 13.3 LV SV:  60 LV SV Index:   37 LVOT Area:     2.84 cm  RIGHT VENTRICLE RV S prime:     21.00 cm/s TAPSE (M-mode): 2.2 cm LEFT ATRIUM             Index        RIGHT ATRIUM          Index LA Vol (A2C):   60.5 ml 37.53 ml/m  RA Area:     9.27 cm LA Vol (A4C):    48.1 ml 29.84 ml/m  RA Volume:   17.10 ml 10.61 ml/m LA Biplane Vol: 55.9 ml 34.67 ml/m  AORTIC VALVE AV Area (Vmax):    1.99 cm AV Area (Vmean):   1.95 cm AV Area (VTI):     2.14 cm AV Vmax:           126.00 cm/s AV Vmean:          93.200 cm/s AV VTI:            0.280 m AV Peak Grad:      6.4 mmHg AV Mean Grad:      4.0 mmHg LVOT Vmax:         88.40 cm/s LVOT Vmean:        64.200 cm/s LVOT VTI:          0.211 m LVOT/AV VTI ratio: 0.75  AORTA Ao Root diam: 2.20 cm MITRAL VALVE MV Area (PHT): 4.52 cm     SHUNTS MV Decel Time: 168 msec     Systemic VTI:  0.21 m MV E velocity: 60.80 cm/s   Systemic Diam: 1.90 cm MV A velocity: 111.00 cm/s MV E/A ratio:  0.55 Arvilla Meres MD Electronically signed by Arvilla Meres MD Signature Date/Time: 12/08/2023/10:46:06 AM    Final (Updated)    CARDIAC CATHETERIZATION Result Date: 12/07/2023 No angiographic evidence of CAD LVEDP 27-30 mm Hg Recommendations: She is volume overloaded and has uncontrolled HTN with renal insufficiency and hyponatremia. Will admit to telemetry and begin diuresis with IV Lasix. BMET in am. Follow BP closely and adjust medication as needed.   Disposition   Pt is being discharged home today in good condition.  Follow-up Plans & Appointments     Follow-up Information     Jonita Albee, PA-C Follow up.   Specialty: Cardiology Why: [facility] location - we moved up your cardiology follow-up appointment to Thursday Dec 14, 2023 at 8:25 AM. Arrive 15 minutes prior to appointment to check in. Nicholos Johns is one of our PAs that works closely with Dr. Servando Salina. Contact information: [address] [place] [place] [address] [phone]         Blane Ohara, MD Follow up.   Specialty: Family Medicine Why: The office will call patient Contact information: [address] [place] [address] [phone]                Discharge Instructions     Diet - low sodium heart healthy    Complete by: As directed    Diabetic diet   Increase activity slowly   Complete by: As directed    Post cath instructions: No driving for 2 days. Do not resume driving if you have previously been instructed not to drive for other reasons. No lifting over 5 lbs for 1 week. No sexual activity for 1 week. Keep procedure site clean & dry. If you notice increased pain, swelling, bleeding or pus, call/return!  You may shower, but no soaking baths/hot tubs/pools for 1 week.        Discharge Medications   Allergies as of 12/08/2023       Reactions   Ketek [telithromycin] Nausea And Vomiting, Rash   Loxapine Succinate Hives   Macrodantin [nitrofurantoin Macrocrystal] Other (See Comments)   Pulmonary fibrosis   Naproxen Sodium Anaphylaxis, Hives   Amoxapine And Related Hives   Darvon Nausea And Vomiting   Belsomra [suvorexant] Other (See Comments)   "Nightmares"   Cymbalta [duloxetine Hcl] Other (See Comments)   "Blackouts"   Duloxetine    Gabapentin Other (See Comments)   Blurry vision   Lyrica [pregabalin]    Makes her sedated   Ozempic (0.25 Or 0.5 Mg-dose) [semaglutide(0.25 Or 0.5mg -dos)] Diarrhea   Semaglutide Diarrhea   Rybelsus   Amoxicillin Rash   Propoxyphene Nausea And Vomiting        Medication List     STOP taking these medications    hydrochlorothiazide 12.5 MG tablet Commonly known as: HYDRODIURIL       TAKE these medications    acetaminophen 650 MG CR tablet Commonly known as: TYLENOL Take 650-1,300 mg by mouth every 8 (eight) hours as needed for pain.   albuterol 108 (90 Base) MCG/ACT inhaler Commonly known as: VENTOLIN HFA INHALE 1 TO 2 PUFFS INTO THE LUNGS EVERY 6 HOURS AS NEEDED FOR WHEEZING OR SHORTNESS OF BREATH   Aspirin Low Dose 81 MG tablet Generic drug: aspirin EC TAKE 1 TABLET(81 MG) BY MOUTH DAILY   atorvastatin 80 MG tablet Commonly known as: LIPITOR TAKE 1 TABLET(80 MG) BY MOUTH DAILY   BD Pen Needle Nano 2nd Gen 32G X 4 MM  Misc Generic drug: Insulin Pen Needle 1 Device by Other route in the morning, at noon, in the evening, and at bedtime. USE TO INJECT INSULIN four times DAILY   carboxymethylcellulose 0.5 % Soln Commonly known as: REFRESH PLUS Place 1 drop into both eyes 3 (three) times daily as needed (dry eyes).   cyanocobalamin 1000 MCG tablet Commonly known as: VITAMIN B12 Take 1,000 mcg by mouth daily with lunch.   dapagliflozin propanediol 10 MG Tabs tablet Commonly known as: Farxiga Take 1 tablet (10 mg total) by mouth daily before breakfast.   diltiazem 180 MG 24 hr capsule Commonly known as: CARDIZEM CD Take 1 capsule (180 mg total) by mouth daily.   Eliquis 5 MG Tabs tablet Generic drug: apixaban TAKE 1 TABLET(5 MG) BY MOUTH TWICE DAILY   estradiol 0.1 MG/GM vaginal cream Commonly known as: ESTRACE Place 1 Applicatorful vaginally 2 (two) times a week. As needed   ferrous sulfate 325 (65 FE) MG tablet Take 325 mg by mouth daily with lunch.   FreeStyle Libre 2 Sensor Misc 2 each by Does not apply route every 14 (fourteen) days. E11.40   Gemtesa 75 MG Tabs Generic drug: Vibegron Take 75 mg by mouth at bedtime.   isosorbide mononitrate 30 MG 24 hr tablet Commonly known as: IMDUR TAKE 1 TABLET BY MOUTH DAILY. PLEASE ARRANGE APPT FOR FURTHER REFILLS   lisinopril 20 MG tablet Commonly known as: ZESTRIL TAKE 1 TABLETS BY MOUTH TWICE DAILY   magnesium oxide 400 (240 Mg) MG tablet Commonly known as: MAG-OX TAKE 2 TABLETS(800 MG) BY MOUTH TWICE DAILY   NovoLOG FlexPen 100 UNIT/ML FlexPen Generic drug: insulin aspart Max daily 30 units What changed:  how much to take how to take this when to take this additional instructions   nystatin  cream Commonly known as: MYCOSTATIN Apply 1 Application topically 4 (four) times daily as needed for dry skin (yeast infection).   ondansetron 8 MG disintegrating tablet Commonly known as: ZOFRAN-ODT Take 8 mg by mouth every 8 (eight) hours  as needed for nausea or vomiting.   pantoprazole 40 MG tablet Commonly known as: PROTONIX TAKE 1 TABLET(40 MG) BY MOUTH EVERY MORNING   PRESERVISION AREDS PO Take 1 capsule by mouth 2 (two) times daily.   pyridoxine 100 MG tablet Commonly known as: B-6 Take 100 mg by mouth daily with lunch.   sennosides-docusate sodium 8.6-50 MG tablet Commonly known as: SENOKOT-S Take 1 tablet by mouth daily as needed for constipation.   sodium chloride 0.65 % Soln nasal spray Commonly known as: OCEAN Place 1 spray into both nostrils as needed for congestion.   spironolactone 25 MG tablet Commonly known as: ALDACTONE Take 1 tablet (25 mg total) by mouth daily.   Trulicity 0.75 MG/0.5ML Soaj Generic drug: Dulaglutide Inject 0.75 mg into the skin once a week.   Vitamin D3 50 MCG (2000 UT) Tabs Take 4,000 Units by mouth daily with lunch.   zolpidem 5 MG tablet Commonly known as: AMBIEN TAKE 1 TABLET(5 MG) BY MOUTH AT BEDTIME AS NEEDED FOR SLEEP          Outstanding Labs/Studies   Recommend obtaining BMET at follow-up appointment  Duration of Discharge Encounter: APP Time: 22 minutes   Signed, Laurann Montana, PA-C 12/08/2023, 3:46 PM

## 2023-12-08 NOTE — Progress Notes (Signed)
 Rounding Note    Patient Name: Pamela [name] Date of Encounter: 12/08/2023  [facility] Cardiologist: Thomasene Ripple, DO   Subjective   Patient seen with family at bedside, and patient sister is on speaker phone during the interview.  We reviewed her cath results and her echocardiogram.  She tells me that she has have been having progressive symptoms of chest heaviness and shortness of breath for some time.  She has a nephrologist in [facility] that in February changed your furosemide to 12.5 mg of hydrochlorothiazide.  Since this time she has felt that her chest pressure has gradually worsened.  We discussed that she does not have coronary disease.  Her sister on speaker phone notes that she does have a history of coronary disease and microvascular angina, and has many questions regarding this.  We reviewed the difference today.  They also have concerns as they were recently changed from Coumadin to apixaban given her protein S deficiency.  She follows with hematology at [facility].  She has strips for her home INR machine at home and has been concerned because her INR has been in the ones where it was previously 2-3 on Coumadin.  We had a long discussion about the difference in the way that these blood thinners work.  They had many questions and I deferred to hematology regarding many of the concerns about the effectiveness of DOAC's and protein C S deficiency.  She was concerned that perhaps changing to Eliquis had affected her symptoms but we discussed that this is unlikely.  She has an extensive history of labile blood pressures.  She notes history of orthostatic hypotension.  She does note that most of her blood pressures have been running higher at home, but she occasionally has blood pressure readings as low as 80s over 40s.  There does not seem to be a clear pattern to this.  We had extensive conversation about her elevated pressures and how these could be related to her  symptoms.  We also discussed that she had a low potassium on her precath labs but this has normalized today.  We discussed different families of diuretics/antihypertensives, talking about the difference between loop diuretics versus MRAs versus thiazide diuretics.  See summary below.  Inpatient Medications    Scheduled Meds:  apixaban  5 mg Oral BID   aspirin EC  81 mg Oral Daily   atorvastatin  80 mg Oral Daily   dapagliflozin propanediol  10 mg Oral QAC breakfast   diltiazem  180 mg Oral Daily   insulin aspart  0-15 Units Subcutaneous TID WC   isosorbide mononitrate  30 mg Oral Daily   lisinopril  20 mg Oral BID   pantoprazole  40 mg Oral Daily   sodium chloride flush  3 mL Intravenous Q12H   sodium chloride flush  3 mL Intravenous Q12H   spironolactone  25 mg Oral Daily   zolpidem  5 mg Oral QHS   Continuous Infusions:  sodium chloride     sodium chloride     PRN Meds: sodium chloride, sodium chloride, acetaminophen, ondansetron (ZOFRAN) IV, sodium chloride flush, sodium chloride flush   Vital Signs    Vitals:   12/08/23 0739 12/08/23 0836 12/08/23 1110 12/08/23 1221  BP: 133/65 (!) 157/50 (!) 161/114 (!) 166/65  Pulse: 78  83   Resp: 18  16   Temp: 97.9 F (36.6 C)  97.9 F (36.6 C)   TempSrc: Oral  Oral   SpO2:  96%  98%   Weight:      Height:        Intake/Output Summary (Last 24 hours) at 12/08/2023 1233 Last data filed at 12/08/2023 1200 Gross per 24 hour  Intake 960 ml  Output 2800 ml  Net -1840 ml      12/08/2023    5:33 AM 12/07/2023    3:16 PM 12/07/2023    8:13 AM  Last 3 Weights  Weight (lbs) 137 lb 12.6 oz 142 lb 3.2 oz 143 lb 6.4 oz  Weight (kg) 62.5 kg 64.5 kg 65.046 kg      Telemetry    SR - Personally Reviewed  Physical Exam   GEN: No acute distress.   Neck: JVD just at clavicle at 60 degrees Cardiac: RRR, no murmurs, rubs, or gallops.  Respiratory: Clear to auscultation bilaterally. GI: Soft, nontender, non-distended  MS: No edema; No  deformity. Neuro:  Nonfocal  Psych: Normal affect   New pertinent results (labs, ECG, imaging, cardiac studies)    Echo personally reviewed, see below.  Assessment & Plan    Exertional dyspnea and chest pressure -Cath without significant CAD -Cath shows elevated LVEDP, and she was admitted overnight for management -We discussed how elevated LVEDP and overall elevated pressures can lead to the symptoms.  We discussed recommended management strategies of this.  It is complicated by her history of intermittent orthostatic hypotension. -We discussed options for management of her fluid status.  I cannot see information from her kidney doctor in [facility].  On our labs, she has stage IIIa chronic kidney disease with most recent creatinines in the low ones.  We discussed that this does not suggest that she is progressing to end-stage renal disease, which is her concern.  She was previously on furosemide, but this was recently stopped by her nephrologist and she was started on 12.5 mg of hydrochlorothiazide instead.  We discussed loop, thiazide, and MRA medications.  In the remote past she was on spironolactone and she does not recall having any issues with this in the past.  We will trial using spironolactone to see if this is able to balance her elevated filling pressures/diastolic dysfunction without causing worsening renal function or worsening hypotension. - She will need close follow-up to determine if she also needs as needed dosing of loop diuretics.  - From my perspective she does not need to be on an aspirin, but if she follows with hematology, we will defer this decision to them.  New nonischemic cardiomyopathy -Mildly reduced LVEF of 45 to 50% without regional wall motion abnormalities.  Cath yesterday shows no significant coronary disease -This is likely in the setting of her elevated blood pressure -I personally reviewed her echo.  Her E/e' does show elevated LVEDP.  While her E/A ratio  is consistent with grade 1 diastolic dysfunction, her overall clinical picture suggests it may be more significant. -She is on [place] chronically as an outpatient, though Dr. Royden Purl notes do mention that she has recurrent UTIs.  Labile hypertension, with history of intermittent orthostatic hypotension. - She is currently taking diltiazem, hydrochlorothiazide, isosorbide, and lisinopril.  The isosorbide especially may cause vasodilation and predispose her to orthostasis.  Recommend compression stockings.  She has had persistently elevated blood pressures here, and with her new mildly reduced EF/mildly dilated cardiomyopathy, my concern that I expressed was that she may be having more high blood pressures than she realizes. - We are stopping hydrochlorothiazide and starting spironolactone as above.  She will  monitor her blood pressures at home and bring a log to her follow-up.  Protein S deficiency -He and her family had many questions about this today.  Especially regarding the change from Coumadin to apixaban.  Have follow-up with hematology next week.  I discussed in general terms but recommended that they discuss the specific concerns they had with hematology next week  Overall we discussed that we will try low-dose of spironolactone and see how she feels after.  If she is stable she will be able to be discharged later this afternoon.  I spent 60 minutes today, including patient interview/exam, chart review, and documentation in the EMR.    Signed, Jodelle Red, MD  12/08/2023, 12:33 PM

## 2023-12-08 NOTE — Care Management CC44 (Signed)
 Condition Code 44 Documentation Completed  Patient Details  Name: Pamela [name] MRN: [id number] Date of Birth: Apr 11, 1946   Condition Code 44 given:  Yes Patient signature on Condition Code 44 notice:  Yes Documentation of 2 MD's agreement:  Yes Code 44 added to claim:  Yes    Leone Haven, RN 12/08/2023, 2:42 PM

## 2023-12-11 ENCOUNTER — Telehealth: Payer: Self-pay

## 2023-12-11 DIAGNOSIS — E1122 Type 2 diabetes mellitus with diabetic chronic kidney disease: Secondary | ICD-10-CM | POA: Diagnosis not present

## 2023-12-11 DIAGNOSIS — D6859 Other primary thrombophilia: Secondary | ICD-10-CM | POA: Diagnosis not present

## 2023-12-11 NOTE — Transitions of Care (Post Inpatient/ED Visit) (Signed)
 12/11/2023  Name: Pamela [name] MRN: [id number] DOB: June 12, 1946  Today's TOC FU Call Status: Today's TOC FU Call Status:: Successful TOC FU Call Completed TOC FU Call Complete Date: 12/11/23 Patient's Name and Date of Birth confirmed.  Transition Care Management Follow-up Telephone Call Date of Discharge: 12/08/23 Discharge Facility: [facility] Type of Discharge: Inpatient Admission Primary Inpatient Discharge Diagnosis:: Heart failure How have you been since you were released from the hospital?: Better Any questions or concerns?: No  Items Reviewed: Did you receive and understand the discharge instructions provided?: No Medications obtained,verified, and reconciled?: Yes (Medications Reviewed) Any new allergies since your discharge?: No Dietary orders reviewed?: Yes Do you have support at home?: Yes People in Home [RPT]: spouse  Medications Reviewed Today: Medications Reviewed Today     Reviewed by Karena Addison, LPN (Licensed Practical Nurse) on 12/11/23 at 1725  Med List Status: <None>   Medication Order Taking? Sig Documenting Provider Last Dose Status Informant  acetaminophen (TYLENOL) 650 MG CR tablet [id number] No Take 650-1,300 mg by mouth every 8 (eight) hours as needed for pain. [provider] Past Week Active Self  albuterol (VENTOLIN HFA) 108 (90 Base) MCG/ACT inhaler [id number] No INHALE 1 TO 2 PUFFS INTO THE LUNGS EVERY 6 HOURS AS NEEDED FOR WHEEZING OR SHORTNESS OF Leona Singleton, PA-C Unknown Active Self  aspirin EC (ASPIRIN LOW DOSE) 81 MG tablet [id number] No TAKE 1 TABLET(81 MG) BY MOUTH DAILY Tobb, Kardie, DO 12/07/2023  6:30 AM Active Self  atorvastatin (LIPITOR) 80 MG tablet [id number] No TAKE 1 TABLET(80 MG) BY MOUTH DAILY Tobb, Kardie, DO 12/05/2023 Active Self           Med Note [name], [name]   Fri Dec 01, 2023  4:03 PM) Lunch  carboxymethylcellulose (REFRESH PLUS) 0.5 % SOLN [id number] No Place 1 drop into both eyes 3  (three) times daily as needed (dry eyes). [provider] Past Week Active Self  Cholecalciferol (VITAMIN D3) 50 MCG (2000 UT) TABS [id number] No Take 4,000 Units by mouth daily with lunch. [provider] 12/06/2023 Active Self           Med Note [name], [name]   Fri Dec 01, 2023  4:10 PM) Ran out  Continuous Glucose Sensor (FREESTYLE LIBRE 2 SENSOR) [place] [id number] No 2 each by Does not apply route every 14 (fourteen) days. E11.40 Shamleffer, Konrad Dolores, MD Taking Active Self  cyanocobalamin (VITAMIN B12) 1000 MCG tablet [id number] No Take 1,000 mcg by mouth daily with lunch. [provider] 12/06/2023 Active Self  dapagliflozin propanediol (FARXIGA) 10 MG TABS tablet [id number] No Take 1 tablet (10 mg total) by mouth daily before breakfast. Shamleffer, Konrad Dolores, MD 12/06/2023 Active Self  diltiazem (CARDIZEM CD) 180 MG 24 hr capsule [id number] No Take 1 capsule (180 mg total) by mouth daily. Thomasene Ripple, DO 12/06/2023 Expired 12/07/23 2359 Self           Med Note [name], [name]   Fri Dec 01, 2023  4:05 PM) Lunch  Dulaglutide (TRULICITY) 0.75 MG/0.5ML Ivory Broad [id number] No Inject 0.75 mg into the skin once a week. Shamleffer, Konrad Dolores, MD 11/28/2023 Active Self  ELIQUIS 5 MG TABS tablet [id number] No TAKE 1 TABLET(5 MG) BY MOUTH TWICE DAILY Mosher, Cameron Proud, PA-C 12/04/2023 Active Self  estradiol (ESTRACE) 0.1 MG/GM vaginal cream [id number] No Place 1 Applicatorful vaginally 2 (two) times a week. As needed [provider] Past Month Active Self  Med Note [name], [name]   Mon Nov 14, 2022  1:37 PM)    ferrous sulfate 325 (65 FE) MG tablet [id number] No Take 325 mg by mouth daily with lunch. [provider] Past Week Active Self  GEMTESA 75 MG TABS [id number] No Take 75 mg by mouth at bedtime. [provider] Past Week Active Self           Med Note [name], [name]   Fri Dec 01, 2023  4:06 PM) Ran out  insulin aspart  (NOVOLOG FLEXPEN) 100 UNIT/ML FlexPen [id number] No Max daily 30 units  Patient taking differently: Inject 6 Units into the skin 3 (three) times daily with meals. Sliding scale Max daily 30 units   Shamleffer, Konrad Dolores, MD 12/06/2023 Evening Active Self           Med Note [name], [name]   Thu Dec 07, 2023  8:37 AM) 4 units  Insulin Pen Needle (BD PEN NEEDLE NANO 2ND GEN) 32G X 4 MM MISC [id number] No 1 Device by Other route in the morning, at noon, in the evening, and at bedtime. USE TO INJECT INSULIN four times DAILY Shamleffer, Konrad Dolores, MD Taking Active Self  isosorbide mononitrate (IMDUR) 30 MG 24 hr tablet [id number] No TAKE 1 TABLET BY MOUTH DAILY. PLEASE ARRANGE APPT FOR FURTHER REFILLS Thomasene Ripple, DO 12/06/2023 Active Self  lisinopril (ZESTRIL) 20 MG tablet [id number] No TAKE 1 TABLETS BY MOUTH TWICE DAILY Cox, Kirsten, MD 12/06/2023 Active Self  magnesium oxide (MAG-OX) 400 (240 Mg) MG tablet [id number] No TAKE 2 TABLETS(800 MG) BY MOUTH TWICE DAILY  Patient taking differently: Take 800 mg by mouth 2 (two) times daily.   [name], MD 12/06/2023 Active Self  Multiple Vitamins-Minerals (PRESERVISION AREDS PO) [id number] No Take 1 capsule by mouth 2 (two) times daily. [provider] 12/06/2023 Active Self  nystatin cream (MYCOSTATIN) [id number] No Apply 1 Application topically 4 (four) times daily as needed for dry skin (yeast infection). [provider] Past Week Active Self  ondansetron (ZOFRAN-ODT) 8 MG disintegrating tablet [id number] No Take 8 mg by mouth every 8 (eight) hours as needed for nausea or vomiting.  [provider] Past Month Active Self  pantoprazole (PROTONIX) 40 MG tablet [id number] No TAKE 1 TABLET(40 MG) BY MOUTH EVERY MORNING Cox, Kirsten, MD 12/07/2023 Morning Active Self  pyridoxine (B-6) 100 MG tablet [id number] No Take 100 mg by mouth daily with lunch. [provider] 12/06/2023 Active Self  sennosides-docusate sodium  (SENOKOT-S) 8.6-50 MG tablet [id number] No Take 1 tablet by mouth daily as needed for constipation. [provider] Past Week Active Self  sodium chloride (OCEAN) 0.65 % SOLN nasal spray [id number] No Place 1 spray into both nostrils as needed for congestion. [provider] Past Week Active Self  spironolactone (ALDACTONE) 25 MG tablet [id number]  Take 1 tablet (25 mg total) by mouth daily. Laurann Montana, PA-C  Active   zolpidem (AMBIEN) 5 MG tablet [id number] No TAKE 1 TABLET(5 MG) BY MOUTH AT BEDTIME AS NEEDED FOR SLEEP  Patient taking differently: Take 5 mg by mouth at bedtime.   Blane Ohara, MD 12/06/2023 Active Self            Home Care and Equipment/Supplies: Were Home Health Services Ordered?: Yes [facility] of [facility] Health Agency:: [facility] Has Agency set up a time to come to your home?: Yes First Home Health Visit Date: 12/12/23 Any new equipment or medical supplies ordered?: NA  Functional Questionnaire:  Do you need assistance with bathing/showering or dressing?: No Do you need assistance with meal preparation?: No Do you need assistance with eating?: No Do you have difficulty maintaining continence: No Do you need assistance with getting out of bed/getting out of a chair/moving?: No Do you have difficulty managing or taking your medications?: No  Follow up appointments reviewed: PCP Follow-up appointment confirmed?: Yes Date of PCP follow-up appointment?: 12/20/23 Follow-up Provider: [facility] Follow-up appointment confirmed?: Yes Date of Specialist follow-up appointment?: 12/14/23 Follow-Up Specialty Provider:: cardio Do you need transportation to your follow-up appointment?: No Do you understand care options if your condition(s) worsen?: Yes-patient verbalized understanding    SIGNATURE Karena Addison, LPN [facility] Nurse Health Advisor Direct Dial [phone]

## 2023-12-11 NOTE — Telephone Encounter (Signed)
 [facility] to fax over orders.  Copied from CRM [id number]. Topic: Clinical - Home Health Verbal Orders >> Dec 11, 2023  8:41 AM Marica Otter wrote: Caller/Agency: [facility] Officer Callback Number: [phone] Secure Line Service Requested: Okay to follow for Physical therapy, Gala Romney states he received orders for patient from hospital and wants to verify with provider Frequency: Assessment Any new concerns about the patient? No

## 2023-12-12 ENCOUNTER — Telehealth: Payer: Self-pay

## 2023-12-12 DIAGNOSIS — E119 Type 2 diabetes mellitus without complications: Secondary | ICD-10-CM | POA: Diagnosis not present

## 2023-12-12 DIAGNOSIS — I088 Other rheumatic multiple valve diseases: Secondary | ICD-10-CM | POA: Diagnosis not present

## 2023-12-12 DIAGNOSIS — N1831 Chronic kidney disease, stage 3a: Secondary | ICD-10-CM | POA: Diagnosis not present

## 2023-12-12 DIAGNOSIS — D631 Anemia in chronic kidney disease: Secondary | ICD-10-CM | POA: Diagnosis not present

## 2023-12-12 DIAGNOSIS — I130 Hypertensive heart and chronic kidney disease with heart failure and stage 1 through stage 4 chronic kidney disease, or unspecified chronic kidney disease: Secondary | ICD-10-CM

## 2023-12-12 DIAGNOSIS — Z48812 Encounter for surgical aftercare following surgery on the circulatory system: Secondary | ICD-10-CM | POA: Diagnosis not present

## 2023-12-12 DIAGNOSIS — E871 Hypo-osmolality and hyponatremia: Secondary | ICD-10-CM | POA: Diagnosis not present

## 2023-12-12 DIAGNOSIS — I5021 Acute systolic (congestive) heart failure: Secondary | ICD-10-CM | POA: Diagnosis not present

## 2023-12-12 DIAGNOSIS — I5033 Acute on chronic diastolic (congestive) heart failure: Secondary | ICD-10-CM | POA: Diagnosis not present

## 2023-12-12 HISTORY — DX: Hypertensive heart and chronic kidney disease with heart failure and stage 1 through stage 4 chronic kidney disease, or unspecified chronic kidney disease: I13.0

## 2023-12-12 NOTE — Telephone Encounter (Signed)
 Copied from CRM [id number]. Topic: Clinical - Home Health Verbal Orders >> Dec 12, 2023  3:34 PM Geroge Baseman wrote: Caller/Agency: [facility] Number: [phone] (can leave a secure message to leave verbal orders) Service Requested: Physical Therapy Frequency: 1w5, 1 every other week for 4 weeks. Any new concerns about the patient? Yes, she has had an apparent 8 pound weigh gain from yesterday to today, confused about this and wanted to relay.

## 2023-12-12 NOTE — Telephone Encounter (Signed)
 Spoke with Tanner, he verbalized understanding and had no questions at this time. He also states patient is taking the spironolactone and he will inform patient of the lasix and low sodium diet and fluid intake as well.

## 2023-12-12 NOTE — Telephone Encounter (Signed)
 Tried calling patient, someone picked up but never said anything.

## 2023-12-13 ENCOUNTER — Ambulatory Visit (HOSPITAL_COMMUNITY)
Admission: RE | Admit: 2023-12-13 | Discharge: 2023-12-13 | Disposition: A | Discharge status: Home / Self Care | Source: Ambulatory Visit | Attending: Cardiology | Admitting: Cardiology

## 2023-12-13 DIAGNOSIS — R0602 Shortness of breath: Secondary | ICD-10-CM | POA: Insufficient documentation

## 2023-12-13 LAB — PULMONARY FUNCTION TEST
DL/VA % pred: 90 %
DL/VA: 3.81 ml/min/mmHg/L
DLCO cor % pred: 73 %
DLCO cor: 12.06 ml/min/mmHg
DLCO unc % pred: 70 %
DLCO unc: 11.59 ml/min/mmHg
FEF 25-75 Post: 2.1 L/s
FEF 25-75 Pre: 2.09 L/s
FEF2575-%Change-Post: 0 %
FEF2575-%Pred-Post: 170 %
FEF2575-%Pred-Pre: 168 %
FEV1-%Change-Post: 4 %
FEV1-%Pred-Post: 130 %
FEV1-%Pred-Pre: 124 %
FEV1-Post: 2.1 L
FEV1-Pre: 2 L
FEV1FVC-%Change-Post: 0 %
FEV1FVC-%Pred-Pre: 111 %
FEV6-%Change-Post: 5 %
FEV6-%Pred-Post: 124 %
FEV6-%Pred-Pre: 118 %
FEV6-Post: 2.55 L
FEV6-Pre: 2.43 L
FEV6FVC-%Pred-Post: 106 %
FEV6FVC-%Pred-Pre: 106 %
FVC-%Change-Post: 5 %
FVC-%Pred-Post: 117 %
FVC-%Pred-Pre: 111 %
FVC-Post: 2.55 L
FVC-Pre: 2.43 L
Post FEV1/FVC ratio: 82 %
Post FEV6/FVC ratio: 100 %
Pre FEV1/FVC ratio: 82 %
Pre FEV6/FVC Ratio: 100 %
RV % pred: 70 %
RV: 1.53 L
TLC % pred: 90 %
TLC: 4.02 L

## 2023-12-13 MED ORDER — ALBUTEROL SULFATE (2.5 MG/3ML) 0.083% IN NEBU
2.5000 mg | INHALATION_SOLUTION | Freq: Once | RESPIRATORY_TRACT | Status: AC
Start: 2023-12-13 — End: 2023-12-13
  Administered 2023-12-13: 2.5 mg via RESPIRATORY_TRACT

## 2023-12-14 ENCOUNTER — Encounter: Payer: Self-pay | Admitting: Cardiology

## 2023-12-14 ENCOUNTER — Ambulatory Visit: Attending: Cardiology | Admitting: Cardiology

## 2023-12-14 VITALS — BP 130/52 | HR 60 | Ht 60.0 in | Wt 139.0 lb

## 2023-12-14 DIAGNOSIS — R0789 Other chest pain: Secondary | ICD-10-CM

## 2023-12-14 DIAGNOSIS — I428 Other cardiomyopathies: Secondary | ICD-10-CM

## 2023-12-14 DIAGNOSIS — E782 Mixed hyperlipidemia: Secondary | ICD-10-CM | POA: Diagnosis not present

## 2023-12-14 DIAGNOSIS — R0602 Shortness of breath: Secondary | ICD-10-CM | POA: Diagnosis not present

## 2023-12-14 DIAGNOSIS — I10 Essential (primary) hypertension: Secondary | ICD-10-CM | POA: Diagnosis not present

## 2023-12-14 DIAGNOSIS — N1831 Chronic kidney disease, stage 3a: Secondary | ICD-10-CM | POA: Diagnosis not present

## 2023-12-14 DIAGNOSIS — D6859 Other primary thrombophilia: Secondary | ICD-10-CM | POA: Diagnosis not present

## 2023-12-14 MED ORDER — ISOSORBIDE MONONITRATE ER 30 MG PO TB24: 30.0000 mg | ORAL_TABLET | Freq: Every day | ORAL | 3 refills | Status: DC

## 2023-12-14 NOTE — Patient Instructions (Signed)
 Medication Instructions:  Your physician recommends that you continue on your current medications as directed. Please refer to the Current Medication list given to you today. *If you need a refill on your cardiac medications before your next appointment, please call your pharmacy*  Lab Work: None ordered If you have labs (blood work) drawn today and your tests are completely normal, you will receive your results only by: MyChart Message (if you have MyChart) OR A paper copy in the mail If you have any lab test that is abnormal or we need to change your treatment, we will call you to review the results.  Testing/Procedures: None ordered  Follow-Up: At [facility], you and your health needs are our priority.  As part of our continuing mission to provide you with exceptional heart care, our providers are all part of one team.  This team includes your primary Cardiologist (physician) and Advanced Practice Providers or APPs (Physician Assistants and Nurse Practitioners) who all work together to provide you with the care you need, when you need it.  Your next appointment:   FOLLOW UP AS SCHEDULED  Provider:   Thomasene Ripple, DO     We recommend signing up for the patient portal called "MyChart".  Sign up information is provided on this After Visit Summary.  MyChart is used to connect with patients for Virtual Visits (Telemedicine).  Patients are able to view lab/test results, encounter notes, upcoming appointments, etc.  Non-urgent messages can be sent to your provider as well.   To learn more about what you can do with MyChart, go to [email or web address].   Other Instructions Please get some ted hose or compression stockings. They can be purchased at your local medical supply store, [employer], [employer] or [employer] fundraiser.  Put them on first thing in the morning and wear them during the day . Elevate your feet during the day and remove hose in the evening before bed.       1st  Floor: - Lobby - Registration  - Pharmacy  - Lab - Cafe  2nd Floor: - PV Lab - Diagnostic Testing (echo, CT, nuclear med)  3rd Floor: - Vacant  4th Floor: - TCTS (cardiothoracic surgery) - AFib Clinic - Structural Heart Clinic - Vascular Surgery  - Vascular Ultrasound  5th Floor: - HeartCare Cardiology (general and EP) - Clinical Pharmacy for coumadin, hypertension, lipid, weight-loss medications, and med management appointments    Valet parking services will be available as well.

## 2023-12-18 DIAGNOSIS — Z48812 Encounter for surgical aftercare following surgery on the circulatory system: Secondary | ICD-10-CM | POA: Diagnosis not present

## 2023-12-18 DIAGNOSIS — I5021 Acute systolic (congestive) heart failure: Secondary | ICD-10-CM | POA: Diagnosis not present

## 2023-12-18 DIAGNOSIS — I5033 Acute on chronic diastolic (congestive) heart failure: Secondary | ICD-10-CM | POA: Diagnosis not present

## 2023-12-18 DIAGNOSIS — D631 Anemia in chronic kidney disease: Secondary | ICD-10-CM | POA: Diagnosis not present

## 2023-12-18 DIAGNOSIS — N1831 Chronic kidney disease, stage 3a: Secondary | ICD-10-CM | POA: Diagnosis not present

## 2023-12-18 DIAGNOSIS — I130 Hypertensive heart and chronic kidney disease with heart failure and stage 1 through stage 4 chronic kidney disease, or unspecified chronic kidney disease: Secondary | ICD-10-CM | POA: Diagnosis not present

## 2023-12-18 DIAGNOSIS — E871 Hypo-osmolality and hyponatremia: Secondary | ICD-10-CM | POA: Diagnosis not present

## 2023-12-18 DIAGNOSIS — E119 Type 2 diabetes mellitus without complications: Secondary | ICD-10-CM | POA: Diagnosis not present

## 2023-12-18 DIAGNOSIS — I088 Other rheumatic multiple valve diseases: Secondary | ICD-10-CM | POA: Diagnosis not present

## 2023-12-20 ENCOUNTER — Ambulatory Visit (INDEPENDENT_AMBULATORY_CARE_PROVIDER_SITE_OTHER): Admitting: Physician Assistant

## 2023-12-20 ENCOUNTER — Telehealth: Payer: Self-pay | Admitting: Family Medicine

## 2023-12-20 ENCOUNTER — Encounter: Payer: Self-pay | Admitting: Physician Assistant

## 2023-12-20 VITALS — BP 162/68 | HR 62 | Temp 97.8°F | Ht 60.0 in | Wt 140.0 lb

## 2023-12-20 DIAGNOSIS — R0609 Other forms of dyspnea: Secondary | ICD-10-CM

## 2023-12-20 DIAGNOSIS — I10 Essential (primary) hypertension: Secondary | ICD-10-CM | POA: Diagnosis not present

## 2023-12-20 DIAGNOSIS — E871 Hypo-osmolality and hyponatremia: Secondary | ICD-10-CM

## 2023-12-20 DIAGNOSIS — I5021 Acute systolic (congestive) heart failure: Secondary | ICD-10-CM | POA: Diagnosis not present

## 2023-12-20 DIAGNOSIS — R300 Dysuria: Secondary | ICD-10-CM | POA: Diagnosis not present

## 2023-12-20 MED ORDER — GEMTESA 75 MG PO TABS: 75.0000 mg | ORAL_TABLET | Freq: Every day | ORAL | 3 refills | Status: AC

## 2023-12-20 MED ORDER — CARVEDILOL 12.5 MG PO TABS: 12.5000 mg | ORAL_TABLET | Freq: Two times a day (BID) | ORAL | 3 refills | Status: DC

## 2023-12-20 NOTE — Telephone Encounter (Signed)
 [facility] [phone]

## 2023-12-20 NOTE — Progress Notes (Unsigned)
 Subjective:  Patient ID: Pamela [name], female    DOB: 1945/10/29  Age: 78 y.o. MRN: [id number]  Chief Complaint  Patient presents with   Follow up after Heart Cath   Discussed the use of AI scribe software for clinical note transcription with the patient, who gave verbal consent to proceed.  History of Present Illness   Clyde, a patient with a complex medical history including protein S deficiency, heart failure, chronic kidney disease, and recurrent urinary tract infections, presents with concerns about high blood pressure and frequent urination. The patient reports a history of fractures and is currently on multiple medications including Eliquis, spironolactone, pantoprazole, and Gemtesa. The patient has been experiencing high blood pressure and frequent urination, which has been disrupting her sleep. The patient also reports a history of urinary tract infections, which recur shortly after completing antibiotic treatment. The patient has a family history of protein S deficiency and antithrombin three deficiency. The patient has been experiencing headaches when trying to read or sew, and reports feeling cold all the time. The patient also reports a history of passing out and experiencing double vision while on certain medications.             04/17/2023    8:55 AM 03/30/2023    2:14 PM 12/14/2022    9:06 AM 02/15/2022    1:16 PM 01/24/2022    8:21 AM  Depression screen PHQ 2/9  Decreased Interest 1 0 0 0 0  Down, Depressed, Hopeless 0 0 0 0 0  PHQ - 2 Score 1 0 0 0 0  Altered sleeping 3      Tired, decreased energy 2      Change in appetite 0      Feeling bad or failure about yourself  0      Trouble concentrating 1      Moving slowly or fidgety/restless 1      Suicidal thoughts 0      PHQ-9 Score 8      Difficult doing work/chores Somewhat difficult            10/31/2023    3:50 PM  Fall Risk   Falls in the past year? 0  Number falls in past yr: 0  Injury with  Fall? 0  Risk for fall due to : No Fall Risks  Follow up Falls evaluation completed    Patient Care Team: Blane Ohara, MD as PCP - General (Internal Medicine) Thomasene Ripple, DO as PCP - Cardiology (Cardiology) Weston Settle, MD as Consulting Physician (Oncology) Roxanne Gates, OD (Optometry) Ortho, [facility] (Specialist) Nyoka Cowden, MD as Consulting Physician (Pulmonary Disease) Drema Halon, MD (Inactive) as Consulting Physician (Otolaryngology) Thomasene Ripple, DO as Consulting Physician (Cardiology) Romero Belling, MD (Inactive) as Consulting Physician (Endocrinology) Zenovia Jordan, MD as Consulting Physician (Rheumatology) Armbruster, Willaim Rayas, MD as Consulting Physician (Gastroenterology) Roxanne Gates, OD (Optometry)   Review of Systems  Constitutional:  Positive for fatigue. Negative for appetite change and fever.  HENT:  Negative for congestion, ear pain, sinus pressure and sore throat.   Respiratory:  Positive for chest tightness and shortness of breath. Negative for cough and wheezing.   Cardiovascular:  Negative for chest pain and palpitations.  Gastrointestinal:  Negative for abdominal pain, constipation, diarrhea, nausea and vomiting.  Genitourinary:  Negative for dysuria and hematuria.  Musculoskeletal:  Negative for arthralgias, back pain, joint swelling and myalgias.  Skin:  Negative for rash.  Neurological:  Positive for dizziness.  Negative for weakness and headaches.       Shaking comes and goes  Psychiatric/Behavioral:  Negative for dysphoric mood. The patient is not nervous/anxious.     Current Outpatient Medications on File Prior to Visit  Medication Sig Dispense Refill   acetaminophen (TYLENOL) 650 MG CR tablet Take 650-1,300 mg by mouth every 8 (eight) hours as needed for pain.     albuterol (VENTOLIN HFA) 108 (90 Base) MCG/ACT inhaler INHALE 1 TO 2 PUFFS INTO THE LUNGS EVERY 6 HOURS AS NEEDED FOR WHEEZING OR SHORTNESS OF BREATH 6.7 g 3   aspirin  EC (ASPIRIN LOW DOSE) 81 MG tablet TAKE 1 TABLET(81 MG) BY MOUTH DAILY 90 tablet 3   atorvastatin (LIPITOR) 80 MG tablet TAKE 1 TABLET(80 MG) BY MOUTH DAILY 90 tablet 3   carboxymethylcellulose (REFRESH PLUS) 0.5 % SOLN Place 1 drop into both eyes 3 (three) times daily as needed (dry eyes).     Cholecalciferol (VITAMIN D3) 50 MCG (2000 UT) TABS Take 4,000 Units by mouth in the morning and at bedtime.     Continuous Glucose Sensor (FREESTYLE LIBRE 2 SENSOR) MISC 2 each by Does not apply route every 14 (fourteen) days. E11.40 6 each 3   cyanocobalamin (VITAMIN B12) 1000 MCG tablet Take 1,000 mcg by mouth daily with lunch.     dapagliflozin propanediol (FARXIGA) 10 MG TABS tablet Take 1 tablet (10 mg total) by mouth daily before breakfast. 90 tablet 3   Dulaglutide (TRULICITY) 0.75 MG/0.5ML SOAJ Inject 0.75 mg into the skin once a week. 6 mL 3   ELIQUIS 5 MG TABS tablet TAKE 1 TABLET(5 MG) BY MOUTH TWICE DAILY 60 tablet 2   estradiol (ESTRACE) 0.1 MG/GM vaginal cream Place 1 Applicatorful vaginally 2 (two) times a week. As needed     ferrous sulfate 325 (65 FE) MG tablet Take 325 mg by mouth daily with lunch.     insulin aspart (NOVOLOG FLEXPEN) 100 UNIT/ML FlexPen Max daily 30 units (Patient taking differently: Inject 6 Units into the skin 3 (three) times daily with meals. Sliding scale Max daily 30 units) 30 mL 3   Insulin Pen Needle (BD PEN NEEDLE NANO 2ND GEN) 32G X 4 MM MISC 1 Device by Other route in the morning, at noon, in the evening, and at bedtime. USE TO INJECT INSULIN four times DAILY (Patient taking differently: 1 Device by Other route 3 (three) times daily. USE TO INJECT INSULIN four times DAILY) 400 each 3   isosorbide mononitrate (IMDUR) 30 MG 24 hr tablet Take 1 tablet (30 mg total) by mouth daily. 90 tablet 3   lisinopril (ZESTRIL) 20 MG tablet TAKE 1 TABLETS BY MOUTH TWICE DAILY 180 tablet 3   magnesium oxide (MAG-OX) 400 (240 Mg) MG tablet TAKE 2 TABLETS(800 MG) BY MOUTH TWICE DAILY  (Patient taking differently: Take 800 mg by mouth 2 (two) times daily.) 120 tablet 2   Multiple Vitamins-Minerals (PRESERVISION AREDS PO) Take 1 capsule by mouth 2 (two) times daily.     nystatin cream (MYCOSTATIN) Apply 1 Application topically 4 (four) times daily as needed for dry skin (yeast infection).     ondansetron (ZOFRAN-ODT) 8 MG disintegrating tablet Take 8 mg by mouth every 8 (eight) hours as needed for nausea or vomiting.      pantoprazole (PROTONIX) 40 MG tablet TAKE 1 TABLET(40 MG) BY MOUTH EVERY MORNING 90 tablet 1   pyridoxine (B-6) 100 MG tablet Take 100 mg by mouth daily with lunch.  sennosides-docusate sodium (SENOKOT-S) 8.6-50 MG tablet Take 1 tablet by mouth daily as needed for constipation.     sodium chloride (OCEAN) 0.65 % SOLN nasal spray Place 1 spray into both nostrils as needed for congestion.     spironolactone (ALDACTONE) 25 MG tablet Take 1 tablet (25 mg total) by mouth daily. 30 tablet 3   zolpidem (AMBIEN) 5 MG tablet TAKE 1 TABLET(5 MG) BY MOUTH AT BEDTIME AS NEEDED FOR SLEEP (Patient taking differently: Take 5 mg by mouth at bedtime.) 30 tablet 5   diltiazem (CARDIZEM CD) 180 MG 24 hr capsule Take 1 capsule (180 mg total) by mouth daily. 90 capsule 3   No current facility-administered medications on file prior to visit.   Past Medical History:  Diagnosis Date   Anticoagulated on Coumadin    chronic--- managed by hematology/ oncology,   Asthma, mild intermittent    followed by pcp--- per pt last exacerbation winter 2021 w/ acute bronchitis   Bilateral leg cramps    Blood dyscrasia    Chronic constipation    CKD (chronic kidney disease), stage III (HCC)    Closed bimalleolar fracture of left ankle 02/26/2020   DDD (degenerative disc disease), cervical    w/ spondylosis,  per pt last steroid injection 06/ 2022   DDD (degenerative disc disease), lumbosacral    DVT (deep venous thrombosis) (HCC) 07/30/2013   Dyspnea    occasionally   Dysrhythmia     GERD (gastroesophageal reflux disease)    History of cardiac murmur as a child    History of DVT of lower extremity    left lower extremity in 1980s, fell when bowling   History of pulmonary embolus (PE) 1993   per pt left lung post op 2 wks cholecystectomy   History of rheumatic fever as a child    per last echo 12-31-2019 no valvular issues   History of TIA (transient ischemic attack)    2014 and 2018 or 2019,  per pt no residual   HTN (hypertension)    followed by pcp   Hyperlipidemia    Hypothyroidism    IDA (iron deficiency anemia)    Macular degeneration of both eyes    Mild obstructive sleep apnea    per pt dx 2017 tried to uses cpap but intolerant   Mixed incontinence urge and stress    urologist--- dr Saddie Benders   OA (osteoarthritis) 07/06/2018   Osteoporosis    PAF (paroxysmal atrial fibrillation) (HCC) 10/01/2014   cardiologist--- dr Lavona Mound tobb;   cardiac cath 02-18-2013 normal coronaries arteries, ef 50%, cath done since echo showed ef 30-35%; nucleat stress study 04/ 2020 normal , normal echo 04/ 2021,  event monitor 09-14-2020 rare ST/AT variable block   Pneumonia    PONV (postoperative nausea and vomiting)    Protein S deficiency (HCC)    followed by hemotology/ oncology-- dr d. Melvyn Neth ([facility]) dx 1980s;  prior DVT left lower leg 1980s and left lung PE 1993; chronic  coumadin since 1980s   PVC's (premature ventricular contractions)    followed by cardiology   S/P cardiac catheterization 02/2013   Normal coronaries; low normal EF at 50%   Solitary pulmonary nodule on lung CT 02/06/2019   First noted 01/13/2014 > no change as of 12/21/2018   Spondylolisthesis, lumbar region 08/09/2018   Stroke (HCC)    TIA in 2018 or 2019   Transient ischemic attack 07/30/2013   Type 2 diabetes mellitus treated with insulin (HCC)  endocrilogist--- whitney reardon NP     (03-10-2021  pt continuously checks blood sugar throughout the day w/ Libre, fasting sugar ---  69--200)   Wears glasses    Wears hearing aid in both ears    Past Surgical History:  Procedure Laterality Date   ABDOMINAL HYSTERECTOMY     BLEPHAROPLASTY     CARDIAC CATHETERIZATION  02/18/2013   @ [facility]  by Dr [place];  normal coronaries w/ preserved LVF, ef 50%;   previous cath 2001 normal ef 65%   CARPAL TUNNEL RELEASE Bilateral 1994   CATARACT EXTRACTION W/ INTRAOCULAR LENS IMPLANT Bilateral 2017   CHOLECYSTECTOMY     CHOLECYSTECTOMY, LAPAROSCOPIC  1993   COLONOSCOPY     EAR BIOPSY Left    FINGER SURGERY Left 2018   thumb   FOOT SURGERY     FOOT TENDON SURGERY Right    early 2000s   FRACTURE SURGERY Left 02/13/2020   left femur   HAND SURGERY Right 04/2022   HARDWARE REMOVAL Left 03/12/2021   Procedure: HARDWARE REMOVAL;  Surgeon: Yolonda Kida, MD;  Location: [facility];  Service: Orthopedics;  Laterality: Left;  60 MINS   JOINT REPLACEMENT     LEFT HEART CATH AND CORONARY ANGIOGRAPHY N/A 12/07/2023   Procedure: LEFT HEART CATH AND CORONARY ANGIOGRAPHY;  Surgeon: Kathleene Hazel, MD;  Location: [facility] INVASIVE CV LAB;  Service: Cardiovascular;  Laterality: N/A;   LUMBAR LAMINECTOMY/DECOMPRESSION MICRODISCECTOMY N/A 12/22/2022   Procedure: Lumbar Two-Lumbar Three, Lumbar Three-Lumbar Four Lumbar Decompression;  Surgeon: Barnett Abu, MD;  Location: [facility] OR;  Service: Neurosurgery;  Laterality: N/A;  RM 20 3C   ORIF ANKLE FRACTURE Left 02/26/2020   Procedure: OPEN REDUCTION INTERNAL FIXATION (ORIF) ANKLE FRACTURE;  Surgeon: Yolonda Kida, MD;  Location: [facility] OR;  Service: Orthopedics;  Laterality: Left;  90 mins   TONSILLECTOMY AND ADENOIDECTOMY Bilateral    TOTAL KNEE ARTHROPLASTY Left 03/2005   TOTAL VAGINAL HYSTERECTOMY  1988   per pt still has ovaries    Family History  Problem Relation Age of Onset   Cirrhosis Mother    Antithrombin III deficiency Mother        multiple emboli   Ulcerative colitis Mother    Diabetes Father    Coronary  artery disease Father    Heart attack Father    Hypertension Father    Kidney disease Father    Hypertension Sister    Diabetes Sister    Heart attack Sister        age 98    Protein S deficiency Daughter    Heart attack Brother    Hypertension Brother    Lung cancer Brother    Stroke Neg Hx    Colitis Neg Hx    Colon polyps Neg Hx    Esophageal cancer Neg Hx    Liver cancer Neg Hx    Pancreatic cancer Neg Hx    Rectal cancer Neg Hx    Stomach cancer Neg Hx    Breast cancer Neg Hx    Social History   Socioeconomic History   Marital status: Married    Spouse name: Jomarie Longs   Number of children: Not on file   Years of education: Not on file   Highest education level: Some college, no degree  Occupational History   Not on file  Tobacco Use   Smoking status: Never   Smokeless tobacco: Never  Vaping Use   Vaping status: Never Used  Substance and  Sexual Activity   Alcohol use: No   Drug use: Never   Sexual activity: Not on file    Comment: Hysterectomy  Other Topics Concern   Not on file  Social History Narrative   Lives with spouse in [place].  2 grown daughters.   Retired Print production planner     Social Drivers of Corporate investment banker Strain: Low Risk  (10/22/2023)   Overall Financial Resource Strain (CARDIA)    Difficulty of Paying Living Expenses: Not very hard  Food Insecurity: No Food Insecurity (12/07/2023)   Hunger Vital Sign    Worried About Running Out of Food in the Last Year: Never true    Ran Out of Food in the Last Year: Never true  Transportation Needs: No Transportation Needs (12/07/2023)   PRAPARE - Administrator, Civil Service (Medical): No    Lack of Transportation (Non-Medical): No  Physical Activity: Inactive (10/22/2023)   Exercise Vital Sign    Days of Exercise per Week: 0 days    Minutes of Exercise per Session: 30 min  Stress: No Stress Concern Present (10/22/2023)   [employer] of Occupational Health - Occupational  Stress Questionnaire    Feeling of Stress : Only a little  Social Connections: Socially Integrated (12/07/2023)   Social Connection and Isolation Panel [NHANES]    Frequency of Communication with Friends and Family: More than three times a week    Frequency of Social Gatherings with Friends and Family: Patient declined    Attends Religious Services: More than 4 times per year    Active Member of [employer] or Organizations: Patient declined    Attends Engineer, structural: More than 4 times per year    Marital Status: Married    Objective:  BP (!) 162/68 (BP Location: Left Arm, Patient Position: Sitting)   Pulse 62   Temp 97.8 F (36.6 C) (Temporal)   Ht 5' (1.524 m)   Wt 140 lb (63.5 kg)   SpO2 98%   BMI 27.34 kg/m      12/20/2023    3:17 PM 12/20/2023    2:07 PM 12/14/2023    8:11 AM  BP/Weight  Systolic BP 162 168 130  Diastolic BP 68 62 52  Wt. (Lbs)  140 139  BMI  27.34 kg/m2 27.15 kg/m2    Physical Exam Vitals reviewed.  Constitutional:      Appearance: Normal appearance.  Cardiovascular:     Rate and Rhythm: Normal rate and regular rhythm.     Heart sounds: Normal heart sounds.  Pulmonary:     Effort: Pulmonary effort is normal.     Breath sounds: Normal breath sounds.  Abdominal:     General: Bowel sounds are normal.     Palpations: Abdomen is soft.     Tenderness: There is no abdominal tenderness.  Neurological:     Mental Status: She is alert and oriented to person, place, and time.  Psychiatric:        Mood and Affect: Mood normal.        Behavior: Behavior normal.     Diabetic Foot Exam - Simple   No data filed      Lab Results  Component Value Date   WBC 6.7 12/08/2023   HGB 12.2 12/08/2023   HCT 35.8 (L) 12/08/2023   PLT 219 12/08/2023   GLUCOSE 139 (H) 12/20/2023   CHOL 136 10/24/2023   TRIG 61 10/24/2023   HDL 71 10/24/2023  LDLCALC 52 10/24/2023   ALT 33 (H) 12/20/2023   AST 27 12/20/2023   NA 125 (L) 12/20/2023   K 5.2  12/20/2023   CL 91 (L) 12/20/2023   CREATININE 1.15 (H) 12/20/2023   BUN 21 12/20/2023   CO2 24 12/20/2023   TSH 1.97 09/27/2023   INR 2.2 07/24/2023   HGBA1C 7.1 (H) 10/24/2023   MICROALBUR 10 06/15/2021   Total time spent on today's visit was 50 minutes, including both face-to-face time and nonface-to-face time personally spent on review of chart (labs and imaging), discussing labs and goals, discussing further work-up, treatment options, referrals to specialist if needed, reviewing outside records of pertinent, answering patient's questions, and coordinating care.    Assessment & Plan:   Hyponatremia Assessment & Plan: Continue to monitor for symptoms of hyponatremia Labs drawn today Will adjust treatment depending on results  Orders: -     Comprehensive metabolic panel with GFR  Uncontrolled hypertension Assessment & Plan: Episodes of elevated blood pressure necessitate transition from diltiazem to carvedilol for improved management alongside heart failure. - Discontinue diltiazem 180 mg. - Start carvedilol 12.5 mg twice a day.  Orders: -     Magnesium -     Carvedilol; Take 1 tablet (12.5 mg total) by mouth 2 (two) times daily with a meal.  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 Switched to spironolactone for fluid management, requiring potassium monitoring. Planned transition from diltiazem to carvedilol for better management. - Discontinue diltiazem 180 mg. - Start carvedilol 12.5 mg twice a day. - Schedule VQ scan for pulmonary clot assessment. - Monitor potassium levels.  Orders: -     Pro b natriuretic peptide (BNP)  Dysuria Assessment & Plan: Prescribed Gemtesa for urinary frequency by previous Urologist. Symptoms returned after  running out of medication. - Prescribe Gemtesa 75 mg, 90-day supply with refills. - Send prescription to [facility] on [address].  Orders: Seferino Dade; Take 1 tablet (75 mg total) by mouth at bedtime.  Dispense: 90 tablet; Refill: 3  DOE (dyspnea on exertion) Assessment & Plan: Active symptoms Elevated D-dimer on visit with hemetology on 12/11/2023 Called and discussed benefit vs risk of changing back to Warfarin with Dr.Moll  Agreed to confirm the presence of a Pulmonary Embolism wit V/Q scan due to low eGFR Will look into changing Eliquis if PE confirmed on imaging Continue taking Eliquis as prescribed  Orders: -     NM Pulmonary Perfusion; Future     Meds ordered this encounter  Medications   GEMTESA 75 MG TABS    Sig: Take 1 tablet (75 mg total) by mouth at bedtime.    Dispense:  90 tablet    Refill:  3   carvedilol (COREG) 12.5 MG tablet    Sig: Take 1 tablet (12.5 mg total) by mouth 2 (two) times daily with a meal.    Dispense:  60 tablet    Refill:  3    Orders Placed This Encounter  Procedures   NM Pulmonary Perfusion   Comprehensive metabolic panel with GFR   Magnesium   Pro b natriuretic peptide    Follow-up Requires follow-up for heart failure, hypertension, and UTIs. Coordination with cardiology and nephrology needed. - Cardiology to schedule follow-up. - Coordinate with Doctor Cox for blood pressure  management. - Monitor blood pressure and kidney function.          Follow-up: Return if symptoms worsen or fail to improve.   I,Lauren M Auman,acting as a Neurosurgeon for [employer], PA.,have documented all relevant documentation on the behalf of Odilia Bennett, PA,as directed by  Odilia Bennett, PA while in the presence of Odilia Bennett, [place].   An After Visit Summary was printed and given to the patient.  Odilia Bennett, [place] [facility] [phone]

## 2023-12-21 ENCOUNTER — Encounter: Payer: Self-pay | Admitting: Physician Assistant

## 2023-12-21 ENCOUNTER — Ambulatory Visit: Admitting: Student

## 2023-12-21 ENCOUNTER — Other Ambulatory Visit: Payer: Self-pay | Admitting: Physician Assistant

## 2023-12-21 DIAGNOSIS — R0609 Other forms of dyspnea: Secondary | ICD-10-CM

## 2023-12-21 LAB — COMPREHENSIVE METABOLIC PANEL WITH GFR
ALT: 33 IU/L — ABNORMAL HIGH (ref 0–32)
AST: 27 IU/L (ref 0–40)
Albumin: 4.1 g/dL (ref 3.8–4.8)
Alkaline Phosphatase: 124 IU/L — ABNORMAL HIGH (ref 44–121)
BUN/Creatinine Ratio: 18 (ref 12–28)
BUN: 21 mg/dL (ref 8–27)
Bilirubin Total: 0.4 mg/dL (ref 0.0–1.2)
CO2: 24 mmol/L (ref 20–29)
Calcium: 9.4 mg/dL (ref 8.7–10.3)
Chloride: 91 mmol/L — ABNORMAL LOW (ref 96–106)
Creatinine, Ser: 1.15 mg/dL — ABNORMAL HIGH (ref 0.57–1.00)
Globulin, Total: 2.5 g/dL (ref 1.5–4.5)
Glucose: 139 mg/dL — ABNORMAL HIGH (ref 70–99)
Potassium: 5.2 mmol/L (ref 3.5–5.2)
Sodium: 125 mmol/L — ABNORMAL LOW (ref 134–144)
Total Protein: 6.6 g/dL (ref 6.0–8.5)
eGFR: 48 mL/min/{1.73_m2} — ABNORMAL LOW (ref >59)

## 2023-12-21 LAB — MAGNESIUM: Magnesium: 1.8 mg/dL (ref 1.6–2.3)

## 2023-12-21 LAB — PRO B NATRIURETIC PEPTIDE: NT-Pro BNP: 524 pg/mL (ref 0–738)

## 2023-12-21 NOTE — Patient Instructions (Signed)
 VISIT SUMMARY:  During today's visit, we discussed your concerns about high blood pressure, frequent urination, and your history of recurrent urinary tract infections. We reviewed your current medications and made some adjustments to better manage your conditions. We also addressed your heart failure, chronic kidney disease, and osteoporosis, and planned for follow-up appointments to monitor your progress.  YOUR PLAN:  -HEART FAILURE: Heart failure means your heart is not pumping blood as well as it should. We are switching your medication from diltiazem to carvedilol to help manage your condition better. You should stop taking diltiazem 180 mg and start taking carvedilol 12.5 mg twice a day. We will also schedule a BQ scan to check for any blood clots in your lungs and monitor your potassium levels.  -HYPERTENSION: Hypertension is high blood pressure. To better manage your blood pressure along with your heart failure, we are switching your medication from diltiazem to carvedilol. Please discontinue diltiazem 180 mg and start taking carvedilol 12.5 mg twice a day.  -PROTEIN S DEFICIENCY: Protein S deficiency is a genetic condition that increases your risk of blood clots. You should continue taking Eliquis as it helps prevent these clots.  -CHRONIC KIDNEY DISEASE STAGE 2: Chronic kidney disease stage 2 means your kidneys are not working as well as they should. We will monitor your kidney function tests regularly, especially since you are on diuretics.  -RECURRENT URINARY TRACT INFECTIONS: Recurrent urinary tract infections mean you frequently get infections in your urinary system. We are prescribing Gemtesa 75 mg to help manage your urinary frequency. Please take it as directed and we have sent a 90-day supply prescription to [facility] on [address].  -OSTEOPOROSIS: Osteoporosis is a condition where your bones become weak and are more likely to break. We will continue with your current pain  management plan.  INSTRUCTIONS:  Please follow up with cardiology for your heart failure and hypertension management. Coordinate with Doctor Cox for blood pressure management. We will also monitor your blood pressure and kidney function regularly. Make sure to take your medications as prescribed and attend all scheduled appointments.

## 2023-12-21 NOTE — Assessment & Plan Note (Signed)
 Active symptoms Elevated D-dimer on visit with hemetology on 12/11/2023 Called and discussed benefit vs risk of changing back to Warfarin with Dr.Moll  Agreed to confirm the presence of a Pulmonary Embolism wit V/Q scan due to low eGFR Will look into changing Eliquis if PE confirmed on imaging Continue taking Eliquis as prescribed

## 2023-12-21 NOTE — Assessment & Plan Note (Addendum)
 Echocardiogram shows LVEF 45-50%, indicating early-stage heart failure with compensatory hypertrophy. Switched to spironolactone for fluid management, requiring potassium monitoring. Planned transition from diltiazem to carvedilol for better management. - Discontinue diltiazem 180 mg. - Start carvedilol 12.5 mg twice a day. - Schedule VQ scan for pulmonary clot assessment. - Monitor potassium levels.

## 2023-12-21 NOTE — Assessment & Plan Note (Signed)
 Prescribed Gemtesa for urinary frequency by previous Urologist. Symptoms returned after running out of medication. - Prescribe Gemtesa 75 mg, 90-day supply with refills. - Send prescription to [facility] on [address].

## 2023-12-21 NOTE — Assessment & Plan Note (Signed)
 Episodes of elevated blood pressure necessitate transition from diltiazem to carvedilol for improved management alongside heart failure. - Discontinue diltiazem 180 mg. - Start carvedilol 12.5 mg twice a day.

## 2023-12-21 NOTE — Assessment & Plan Note (Signed)
>>  ASSESSMENT AND PLAN FOR HTN (HYPERTENSION) WRITTEN ON 12/21/2023  3:07 PM BY CRAFT, BRADY, PA  Episodes of elevated blood pressure necessitate transition from diltiazem  to carvedilol  for improved management alongside heart failure. - Discontinue diltiazem  180 mg. - Start carvedilol  12.5 mg twice a day.

## 2023-12-21 NOTE — Assessment & Plan Note (Signed)
 Continue to monitor for symptoms of hyponatremia Labs drawn today Will adjust treatment depending on results

## 2023-12-22 ENCOUNTER — Ambulatory Visit (HOSPITAL_COMMUNITY)
Admission: RE | Admit: 2023-12-22 | Discharge: 2023-12-22 | Disposition: A | Discharge status: Home / Self Care | Source: Ambulatory Visit | Attending: Physician Assistant | Admitting: Physician Assistant

## 2023-12-22 ENCOUNTER — Ambulatory Visit (HOSPITAL_COMMUNITY)
Admission: RE | Admit: 2023-12-22 | Discharge: 2023-12-22 | Disposition: A | Discharge status: Home / Self Care | Source: Ambulatory Visit | Attending: Physician Assistant

## 2023-12-22 DIAGNOSIS — I130 Hypertensive heart and chronic kidney disease with heart failure and stage 1 through stage 4 chronic kidney disease, or unspecified chronic kidney disease: Secondary | ICD-10-CM | POA: Diagnosis not present

## 2023-12-22 DIAGNOSIS — R079 Chest pain, unspecified: Secondary | ICD-10-CM | POA: Diagnosis not present

## 2023-12-22 DIAGNOSIS — R0609 Other forms of dyspnea: Secondary | ICD-10-CM

## 2023-12-22 DIAGNOSIS — N1831 Chronic kidney disease, stage 3a: Secondary | ICD-10-CM | POA: Diagnosis not present

## 2023-12-22 DIAGNOSIS — I10 Essential (primary) hypertension: Secondary | ICD-10-CM | POA: Diagnosis not present

## 2023-12-22 DIAGNOSIS — Z48812 Encounter for surgical aftercare following surgery on the circulatory system: Secondary | ICD-10-CM | POA: Diagnosis not present

## 2023-12-22 DIAGNOSIS — R0602 Shortness of breath: Secondary | ICD-10-CM | POA: Diagnosis not present

## 2023-12-22 DIAGNOSIS — E119 Type 2 diabetes mellitus without complications: Secondary | ICD-10-CM | POA: Diagnosis not present

## 2023-12-22 DIAGNOSIS — I420 Dilated cardiomyopathy: Secondary | ICD-10-CM

## 2023-12-22 DIAGNOSIS — J9811 Atelectasis: Secondary | ICD-10-CM | POA: Diagnosis not present

## 2023-12-22 DIAGNOSIS — I480 Paroxysmal atrial fibrillation: Secondary | ICD-10-CM

## 2023-12-22 DIAGNOSIS — D631 Anemia in chronic kidney disease: Secondary | ICD-10-CM | POA: Diagnosis not present

## 2023-12-22 DIAGNOSIS — I2699 Other pulmonary embolism without acute cor pulmonale: Secondary | ICD-10-CM | POA: Diagnosis not present

## 2023-12-22 DIAGNOSIS — E871 Hypo-osmolality and hyponatremia: Secondary | ICD-10-CM | POA: Diagnosis not present

## 2023-12-22 DIAGNOSIS — I5033 Acute on chronic diastolic (congestive) heart failure: Secondary | ICD-10-CM | POA: Diagnosis not present

## 2023-12-22 DIAGNOSIS — I088 Other rheumatic multiple valve diseases: Secondary | ICD-10-CM | POA: Diagnosis not present

## 2023-12-22 DIAGNOSIS — I5021 Acute systolic (congestive) heart failure: Secondary | ICD-10-CM | POA: Diagnosis not present

## 2023-12-22 DIAGNOSIS — I2510 Atherosclerotic heart disease of native coronary artery without angina pectoris: Secondary | ICD-10-CM

## 2023-12-22 MED ORDER — TECHNETIUM TO 99M ALBUMIN AGGREGATED
4.4000 | Freq: Once | INTRAVENOUS | Status: AC
  Administered 2023-12-22: 4.4 via INTRAVENOUS

## 2023-12-25 ENCOUNTER — Encounter: Payer: Self-pay | Admitting: Physician Assistant

## 2023-12-25 DIAGNOSIS — I088 Other rheumatic multiple valve diseases: Secondary | ICD-10-CM | POA: Diagnosis not present

## 2023-12-25 DIAGNOSIS — E119 Type 2 diabetes mellitus without complications: Secondary | ICD-10-CM | POA: Diagnosis not present

## 2023-12-25 DIAGNOSIS — N1831 Chronic kidney disease, stage 3a: Secondary | ICD-10-CM | POA: Diagnosis not present

## 2023-12-25 DIAGNOSIS — I5021 Acute systolic (congestive) heart failure: Secondary | ICD-10-CM | POA: Diagnosis not present

## 2023-12-25 DIAGNOSIS — I5033 Acute on chronic diastolic (congestive) heart failure: Secondary | ICD-10-CM | POA: Diagnosis not present

## 2023-12-25 DIAGNOSIS — Z48812 Encounter for surgical aftercare following surgery on the circulatory system: Secondary | ICD-10-CM | POA: Diagnosis not present

## 2023-12-25 DIAGNOSIS — D631 Anemia in chronic kidney disease: Secondary | ICD-10-CM | POA: Diagnosis not present

## 2023-12-25 DIAGNOSIS — E871 Hypo-osmolality and hyponatremia: Secondary | ICD-10-CM | POA: Diagnosis not present

## 2023-12-25 DIAGNOSIS — I130 Hypertensive heart and chronic kidney disease with heart failure and stage 1 through stage 4 chronic kidney disease, or unspecified chronic kidney disease: Secondary | ICD-10-CM | POA: Diagnosis not present

## 2023-12-27 ENCOUNTER — Encounter: Payer: Self-pay | Admitting: Physician Assistant

## 2023-12-27 ENCOUNTER — Other Ambulatory Visit: Payer: Self-pay

## 2023-12-27 DIAGNOSIS — E871 Hypo-osmolality and hyponatremia: Secondary | ICD-10-CM

## 2023-12-28 ENCOUNTER — Ambulatory Visit: Admitting: Family Medicine

## 2023-12-28 ENCOUNTER — Other Ambulatory Visit

## 2023-12-28 ENCOUNTER — Encounter: Payer: Self-pay | Admitting: Family Medicine

## 2023-12-28 VITALS — BP 118/62 | HR 64 | Temp 98.0°F

## 2023-12-28 DIAGNOSIS — N3000 Acute cystitis without hematuria: Secondary | ICD-10-CM | POA: Diagnosis not present

## 2023-12-28 DIAGNOSIS — E871 Hypo-osmolality and hyponatremia: Secondary | ICD-10-CM

## 2023-12-28 LAB — COMPREHENSIVE METABOLIC PANEL WITH GFR
ALT: 28 IU/L (ref 0–32)
AST: 27 IU/L (ref 0–40)
Albumin: 3.9 g/dL (ref 3.8–4.8)
Alkaline Phosphatase: 113 IU/L (ref 44–121)
BUN/Creatinine Ratio: 24 (ref 12–28)
BUN: 31 mg/dL — ABNORMAL HIGH (ref 8–27)
Bilirubin Total: 0.5 mg/dL (ref 0.0–1.2)
CO2: 23 mmol/L (ref 20–29)
Calcium: 9.1 mg/dL (ref 8.7–10.3)
Chloride: 94 mmol/L — ABNORMAL LOW (ref 96–106)
Creatinine, Ser: 1.29 mg/dL — ABNORMAL HIGH (ref 0.57–1.00)
Globulin, Total: 1.6 g/dL (ref 1.5–4.5)
Glucose: 174 mg/dL — ABNORMAL HIGH (ref 70–99)
Potassium: 5.9 mmol/L — ABNORMAL HIGH (ref 3.5–5.2)
Sodium: 129 mmol/L — ABNORMAL LOW (ref 134–144)
Total Protein: 5.5 g/dL — ABNORMAL LOW (ref 6.0–8.5)
eGFR: 42 mL/min/{1.73_m2} — ABNORMAL LOW (ref >59)

## 2023-12-28 LAB — POCT URINALYSIS DIP (CLINITEK)
Bilirubin, UA: NEGATIVE
Blood, UA: NEGATIVE
Glucose, UA: >=1000 mg/dL — AB
Ketones, POC UA: NEGATIVE mg/dL
Nitrite, UA: NEGATIVE
POC PROTEIN,UA: NEGATIVE
Spec Grav, UA: 1.01 (ref 1.010–1.025)
Urobilinogen, UA: 0.2 U/dL
pH, UA: 6 (ref 5.0–8.0)

## 2023-12-28 MED ORDER — CEPHALEXIN 500 MG PO CAPS: 500.0000 mg | ORAL_CAPSULE | Freq: Two times a day (BID) | ORAL | 0 refills | Status: AC

## 2023-12-28 NOTE — Progress Notes (Signed)
 Acute Office Visit  Subjective:    Patient ID: Pamela [name], female    DOB: 1946-06-25, 78 y.o.   MRN: [id number]  Chief Complaint  Patient presents with   Urinary Tract Infection    HPI: Discussed the use of AI scribe software for clinical note transcription with the patient, who gave verbal consent to proceed.  History of Present Illness   Pamela [name] is a 78 year old female with recurrent urinary tract infections who presents with frequent urination and shortness of breath.  She has been experiencing frequent urination and was diagnosed with a urinary tract infection (UTI) today. She had a UTI three weeks ago and has been experiencing these infections frequently. Previously, she was treated with Keflex  500 mg twice a day for seven days, which resolved the infection for about three weeks. Her history of allergies to multiple medications complicates her treatment options.  She reports new onset shortness of breath over the last month and a half. She has undergone extensive testing, including cardiac catheterizations, pulmonary function tests, and CT scans. She has a history of cardiac murmur, which has progressed to heart failure and atrial fibrillation (AFib).  Her medical history is significant for diabetes and kidney failure. She mentions that her sodium levels are low, which may be related to her frequent urination. She is currently on medications for her heart condition, although specific medications are not detailed in the conversation.  She has been actively managing her health with frequent doctor visits, including appointments last week on Monday, Wednesday, and Friday with different doctors. She has upcoming appointments scheduled for follow-up.       Past Medical History:  Diagnosis Date   Anticoagulated on Coumadin     chronic--- managed by hematology/ oncology,   Asthma, mild intermittent    followed by pcp--- per pt last exacerbation  winter 2021 w/ acute bronchitis   Bilateral leg cramps    Blood dyscrasia    Chronic constipation    CKD (chronic kidney disease), stage III (HCC)    Closed bimalleolar fracture of left ankle 02/26/2020   DDD (degenerative disc disease), cervical    w/ spondylosis,  per pt last steroid injection 06/ 2022   DDD (degenerative disc disease), lumbosacral    DVT (deep venous thrombosis) (HCC) 07/30/2013   Dyspnea    occasionally   Dysrhythmia    GERD (gastroesophageal reflux disease)    History of cardiac murmur as a child    History of DVT of lower extremity    left lower extremity in 1980s, fell when bowling   History of pulmonary embolus (PE) 1993   per pt left lung post op 2 wks cholecystectomy   History of rheumatic fever as a child    per last echo 12-31-2019 no valvular issues   History of TIA (transient ischemic attack)    2014 and 2018 or 2019,  per pt no residual   HTN (hypertension)    followed by pcp   Hyperlipidemia    Hypothyroidism    IDA (iron deficiency anemia)    Macular degeneration of both eyes    Mild obstructive sleep apnea    per pt dx 2017 tried to uses cpap but intolerant   Mixed incontinence urge and stress    urologist--- dr Reginal Capra   OA (osteoarthritis) 07/06/2018   Osteoporosis    PAF (paroxysmal atrial fibrillation) (HCC) 10/01/2014   cardiologist--- dr Chyrl Crawford tobb;   cardiac cath 02-18-2013 normal coronaries arteries, ef  50%, cath done since echo showed ef 30-35%; nucleat stress study 04/ 2020 normal , normal echo 04/ 2021,  event monitor 09-14-2020 rare ST/AT variable block   Pneumonia    PONV (postoperative nausea and vomiting)    Protein S deficiency (HCC)    followed by hemotology/ oncology-- dr d. Harles Lied ([facility]) dx 1980s;  prior DVT left lower leg 1980s and left lung PE 1993; chronic  coumadin  since 1980s   PVC's (premature ventricular contractions)    followed by cardiology   S/P cardiac catheterization 02/2013   Normal  coronaries; low normal EF at 50%   Solitary pulmonary nodule on lung CT 02/06/2019   First noted 01/13/2014 > no change as of 12/21/2018   Spondylolisthesis, lumbar region 08/09/2018   Stroke (HCC)    TIA in 2018 or 2019   Transient ischemic attack 07/30/2013   Type 2 diabetes mellitus treated with insulin  (HCC)    endocrilogist--- whitney reardon NP     (03-10-2021  pt continuously checks blood sugar throughout the day w/ Libre, fasting sugar --- 69--200)   Wears glasses    Wears hearing aid in both ears     Past Surgical History:  Procedure Laterality Date   ABDOMINAL HYSTERECTOMY     BLEPHAROPLASTY     CARDIAC CATHETERIZATION  02/18/2013   @ [facility]  by Dr Sw[place];  normal coronaries w/ preserved LVF, ef 50%;   previous cath 2001 normal ef 65%   CARPAL TUNNEL RELEASE Bilateral 1994   CATARACT EXTRACTION W/ INTRAOCULAR LENS IMPLANT Bilateral 2017   CHOLECYSTECTOMY     CHOLECYSTECTOMY, LAPAROSCOPIC  1993   COLONOSCOPY     EAR BIOPSY Left    FINGER SURGERY Left 2018   thumb   FOOT SURGERY     FOOT TENDON SURGERY Right    early 2000s   FRACTURE SURGERY Left 02/13/2020   left femur   HAND SURGERY Right 04/2022   HARDWARE REMOVAL Left 03/12/2021   Procedure: HARDWARE REMOVAL;  Surgeon: Janeth Medicus, MD;  Location: [facility];  Service: Orthopedics;  Laterality: Left;  60 MINS   JOINT REPLACEMENT     LEFT HEART CATH AND CORONARY ANGIOGRAPHY N/A 12/07/2023   Procedure: LEFT HEART CATH AND CORONARY ANGIOGRAPHY;  Surgeon: Odie Benne, MD;  Location: [facility] IN[facility]IVE [facility];  Service: Cardiovascular;  Laterality: N/A;   LUMBAR LAMINECTOMY/DECOMPRESSION MICRODISCECTOMY N/A 12/22/2022   Procedure: Lumbar Two-Lumbar Three, Lumbar Three-Lumbar Four Lumbar Decompression;  Surgeon: Elna Haggis, MD;  Location: [facility] OR;  Service: Neurosurgery;  Laterality: N/A;  RM 20 3C   ORIF ANKLE FRACTURE Left 02/26/2020   Procedure: OPEN REDUCTION INTERNAL FIXATION (ORIF) ANKLE  FRACTURE;  Surgeon: Janeth Medicus, MD;  Location: [facility] OR;  Service: Orthopedics;  Laterality: Left;  90 mins   TONSILLECTOMY AND ADENOIDECTOMY Bilateral    TOTAL KNEE ARTHROPLASTY Left 03/2005   TOTAL VAGINAL HYSTERECTOMY  1988   per pt still has ovaries    Family History  Problem Relation Age of Onset   Cirrhosis Mother    Antithrombin III deficiency Mother        multiple emboli   Ulcerative colitis Mother    Diabetes Father    Coronary artery disease Father    Heart attack Father    Hypertension Father    Kidney disease Father    Hypertension Sister    Diabetes Sister    Heart attack Sister        age 35  Protein S deficiency Daughter    Heart attack Brother    Hypertension Brother    Lung cancer Brother    Stroke Neg Hx    Colitis Neg Hx    Colon polyps Neg Hx    Esophageal cancer Neg Hx    Liver cancer Neg Hx    Pancreatic cancer Neg Hx    Rectal cancer Neg Hx    Stomach cancer Neg Hx    Breast cancer Neg Hx     Social History   Socioeconomic History   Marital status: Married    Spouse name: Drexel Gentles   Number of children: Not on file   Years of education: Not on file   Highest education level: Some college, no degree  Occupational History   Not on file  Tobacco Use   Smoking status: Never   Smokeless tobacco: Never  Vaping Use   Vaping status: Never Used  Substance and Sexual Activity   Alcohol use: No   Drug use: Never   Sexual activity: Not on file    Comment: Hysterectomy  Other Topics Concern   Not on file  Social History Narrative   Lives with spouse in [place].  2 grown daughters.   Retired Print production planner     Social Drivers of Corporate investment banker Strain: Low Risk  (10/22/2023)   Overall Financial Resource Strain (CARDIA)    Difficulty of Paying Living Expenses: Not very hard  Food Insecurity: No Food Insecurity (12/07/2023)   Hunger Vital Sign    Worried About Running Out of Food in the Last Year: Never true    Ran Out of  Food in the Last Year: Never true  Transportation Needs: No Transportation Needs (12/07/2023)   PRAPARE - Administrator, Civil Service (Medical): No    Lack of Transportation (Non-Medical): No  Physical Activity: Inactive (10/22/2023)   Exercise Vital Sign    Days of Exercise per Week: 0 days    Minutes of Exercise per Session: 30 min  Stress: No Stress Concern Present (10/22/2023)   [employer] of Occupational Health - Occupational Stress Questionnaire    Feeling of Stress : Only a little  Social Connections: Socially Integrated (12/07/2023)   Social Connection and Isolation Panel [NHANES]    Frequency of Communication with Friends and Family: More than three times a week    Frequency of Social Gatherings with Friends and Family: Patient declined    Attends Religious Services: More than 4 times per year    Active Member of [employer] or Organizations: Patient declined    Attends Engineer, structural: More than 4 times per year    Marital Status: Married  Catering manager Violence: Not At Risk (12/07/2023)   Humiliation, Afraid, Rape, and Kick questionnaire    Fear of Current or Ex-Partner: No    Emotionally Abused: No    Physically Abused: No    Sexually Abused: No    Outpatient Medications Prior to Visit  Medication Sig Dispense Refill   acetaminophen  (TYLENOL ) 650 MG CR tablet Take 650-1,300 mg by mouth every 8 (eight) hours as needed for pain.     albuterol  (VENTOLIN  HFA) 108 (90 Base) MCG/ACT inhaler INHALE 1 TO 2 PUFFS INTO THE LUNGS EVERY 6 HOURS AS NEEDED FOR WHEEZING OR SHORTNESS OF BREATH 6.7 g 3   aspirin  EC (ASPIRIN  LOW DOSE) 81 MG tablet TAKE 1 TABLET(81 MG) BY MOUTH DAILY 90 tablet 3   atorvastatin  (LIPITOR ) 80 MG tablet  TAKE 1 TABLET(80 MG) BY MOUTH DAILY 90 tablet 3   carboxymethylcellulose (REFRESH PLUS) 0.5 % SOLN Place 1 drop into both eyes 3 (three) times daily as needed (dry eyes).     carvedilol  (COREG ) 12.5 MG tablet Take 1 tablet (12.5 mg  total) by mouth 2 (two) times daily with a meal. 60 tablet 3   Cholecalciferol (VITAMIN D3) 50 MCG (2000 UT) TABS Take 4,000 Units by mouth in the morning and at bedtime.     Continuous Glucose Sensor (FREESTYLE LIBRE 2 SENSOR) MISC 2 each by Does not apply route every 14 (fourteen) days. E11.40 6 each 3   cyanocobalamin  (VITAMIN B12) 1000 MCG tablet Take 1,000 mcg by mouth daily with lunch.     dapagliflozin  propanediol (FARXIGA ) 10 MG TABS tablet Take 1 tablet (10 mg total) by mouth daily before breakfast. 90 tablet 3   Dulaglutide  (TRULICITY ) 0.75 MG/0.5ML SOAJ Inject 0.75 mg into the skin once a week. 6 mL 3   ELIQUIS  5 MG TABS tablet TAKE 1 TABLET(5 MG) BY MOUTH TWICE DAILY 60 tablet 2   estradiol (ESTRACE) 0.1 MG/GM vaginal cream Place 1 Applicatorful vaginally 2 (two) times a week. As needed     ferrous sulfate  325 (65 FE) MG tablet Take 325 mg by mouth daily with lunch.     GEMTESA  75 MG TABS Take 1 tablet (75 mg total) by mouth at bedtime. 90 tablet 3   insulin  aspart (NOVOLOG  FLEXPEN) 100 UNIT/ML FlexPen Max daily 30 units (Patient taking differently: Inject 6 Units into the skin 3 (three) times daily with meals. Sliding scale Max daily 30 units) 30 mL 3   Insulin  Pen Needle (BD PEN NEEDLE NANO 2ND GEN) 32G X 4 MM MISC 1 Device by Other route in the morning, at noon, in the evening, and at bedtime. USE TO INJECT INSULIN  four times DAILY (Patient taking differently: 1 Device by Other route 3 (three) times daily. USE TO INJECT INSULIN  four times DAILY) 400 each 3   isosorbide  mononitrate (IMDUR ) 30 MG 24 hr tablet Take 1 tablet (30 mg total) by mouth daily. 90 tablet 3   lisinopril  (ZESTRIL ) 20 MG tablet TAKE 1 TABLETS BY MOUTH TWICE DAILY 180 tablet 3   magnesium  oxide (MAG-OX) 400 (240 Mg) MG tablet TAKE 2 TABLETS(800 MG) BY MOUTH TWICE DAILY (Patient taking differently: Take 800 mg by mouth 2 (two) times daily.) 120 tablet 2   Multiple Vitamins-Minerals (PRESERVISION AREDS PO) Take 1  capsule by mouth 2 (two) times daily.     nystatin cream (MYCOSTATIN) Apply 1 Application topically 4 (four) times daily as needed for dry skin (yeast infection).     ondansetron  (ZOFRAN -ODT) 8 MG disintegrating tablet Take 8 mg by mouth every 8 (eight) hours as needed for nausea or vomiting.      pantoprazole  (PROTONIX ) 40 MG tablet TAKE 1 TABLET(40 MG) BY MOUTH EVERY MORNING 90 tablet 1   pyridoxine (B-6) 100 MG tablet Take 100 mg by mouth daily with lunch.     sennosides-docusate sodium  (SENOKOT-S) 8.6-50 MG tablet Take 1 tablet by mouth daily as needed for constipation.     sodium chloride  (OCEAN) 0.65 % SOLN nasal spray Place 1 spray into both nostrils as needed for congestion.     spironolactone  (ALDACTONE ) 25 MG tablet Take 1 tablet (25 mg total) by mouth daily. 30 tablet 3   zolpidem  (AMBIEN ) 5 MG tablet TAKE 1 TABLET(5 MG) BY MOUTH AT BEDTIME AS NEEDED FOR SLEEP (Patient taking differently:  Take 5 mg by mouth at bedtime.) 30 tablet 5   diltiazem  (CARDIZEM  CD) 180 MG 24 hr capsule Take 1 capsule (180 mg total) by mouth daily. 90 capsule 3   No facility-administered medications prior to visit.    Allergies  Allergen Reactions   Ketek [Telithromycin] Nausea And Vomiting and Rash   Loxapine Succinate Hives   Macrodantin  [Nitrofurantoin  Macrocrystal] Other (See Comments)    Pulmonary fibrosis   Naproxen Sodium Anaphylaxis and Hives   Amoxapine And Related Hives   Darvon Nausea And Vomiting   Belsomra [Suvorexant] Other (See Comments)    "Nightmares"   Cymbalta [Duloxetine Hcl] Other (See Comments)    "Blackouts"   Duloxetine    Gabapentin  Other (See Comments)    Blurry vision   Lyrica  [Pregabalin ]     Makes her sedated   Ozempic  (0.25 Or 0.5 Mg-Dose) [Semaglutide (0.25 Or 0.5mg -Dos)] Diarrhea   Semaglutide  Diarrhea    Rybelsus    Amoxicillin Rash   Propoxyphene Nausea And Vomiting    Review of Systems  Constitutional:  Negative for chills, diaphoresis, fatigue and fever.   HENT:  Negative for congestion, ear pain and sinus pain.   Respiratory:  Positive for shortness of breath. Negative for cough.   Cardiovascular:  Negative for chest pain.  Gastrointestinal:  Negative for abdominal pain, constipation, nausea and vomiting.  Genitourinary:  Positive for dysuria, frequency and urgency. Negative for hematuria.  Musculoskeletal:  Negative for arthralgias.  Neurological:  Negative for weakness and headaches.  Psychiatric/Behavioral:  Negative for dysphoric mood. The patient is not nervous/anxious.        Objective:        12/28/2023    9:18 AM 12/20/2023    3:17 PM 12/20/2023    2:07 PM  Vitals with BMI  Height   5\' 0"   Weight   140 lbs  BMI   27.34  Systolic 118 162 161  Diastolic 62 68 62  Pulse 64  62    No data found.   Physical Exam Constitutional:      General: She is not in acute distress.    Appearance: Normal appearance.  Eyes:     Conjunctiva/sclera: Conjunctivae normal.  Cardiovascular:     Rate and Rhythm: Normal rate.     Heart sounds: Murmur heard.  Pulmonary:     Effort: Pulmonary effort is normal.     Breath sounds: Normal breath sounds. No wheezing.  Skin:    General: Skin is warm.  Neurological:     Mental Status: She is alert. Mental status is at baseline.  Psychiatric:        Mood and Affect: Mood normal.        Behavior: Behavior normal.     Health Maintenance Due  Topic Date Due   Zoster Vaccines- Shingrix (1 of 2) 09/18/1964   DTaP/Tdap/Td (2 - Td or Tdap) 11/15/2023    There are no preventive care reminders to display for this patient.   Lab Results  Component Value Date   TSH 1.97 09/27/2023   Lab Results  Component Value Date   WBC 6.7 12/08/2023   HGB 12.2 12/08/2023   HCT 35.8 (L) 12/08/2023   MCV 90.9 12/08/2023   PLT 219 12/08/2023   Lab Results  Component Value Date   NA 125 (L) 12/20/2023   K 5.2 12/20/2023   CO2 24 12/20/2023   GLUCOSE 139 (H) 12/20/2023   BUN 21 12/20/2023    CREATININE 1.15 (H) 12/20/2023   BILITOT  0.4 12/20/2023   ALKPHOS 124 (H) 12/20/2023   AST 27 12/20/2023   ALT 33 (H) 12/20/2023   PROT 6.6 12/20/2023   ALBUMIN  4.1 12/20/2023   CALCIUM  9.4 12/20/2023   ANIONGAP 11 12/08/2023   EGFR 48 (L) 12/20/2023   GFR 41.53 (L) 09/27/2023   Lab Results  Component Value Date   CHOL 136 10/24/2023   Lab Results  Component Value Date   HDL 71 10/24/2023   Lab Results  Component Value Date   LDLCALC 52 10/24/2023   Lab Results  Component Value Date   TRIG 61 10/24/2023   Lab Results  Component Value Date   CHOLHDL 1.9 10/24/2023   Lab Results  Component Value Date   HGBA1C 7.1 (H) 10/24/2023       Assessment & Plan:  Acute cystitis without hematuria Assessment & Plan: UTI I have prescribed Keflex  500 mg by mouth TWICE A DAY for 7 days Your symptoms should gradually improve. Call if the burning worsens, you develop a fever, back pain or pelvic pain or if your symptom do not resolve after completing the antibiotic. Drink plenty of fluids Complete the full course of antibiotics even if the symptoms resolve Remember to wipe from front to back and don't hold it in! If possible, empty your bladder every 4 hours. - urine culture sent - discussed referral to Urologist for recurrent UTI's    Orders: -     POCT URINALYSIS DIP (CLINITEK) -     Urine Culture -     Cephalexin ; Take 1 capsule (500 mg total) by mouth 2 (two) times daily for 7 days.  Dispense: 14 capsule; Refill: 0  Hyponatremia Assessment & Plan: Na 125 -  12/20/23 Cause undetermined. - Order blood work to assess sodium levels and other parameters.   Orders: -     Osmolality -     Sodium, urine, random -     Creatinine, urine, random -     Osmolality, urine   Meds ordered this encounter  Medications   cephALEXin  (KEFLEX ) 500 MG capsule    Sig: Take 1 capsule (500 mg total) by mouth 2 (two) times daily for 7 days.    Dispense:  14 capsule    Refill:  0     Orders Placed This Encounter  Procedures   Urine Culture   Osmolality   Sodium, urine, random   Creatinine, urine, random   Osmolality, urine   POCT URINALYSIS DIP (CLINITEK)     Follow-up: Return if symptoms worsen or fail to improve.  An After Visit Summary was printed and given to the patient.  Total time spent on today's visit was 37 minutes, including both face-to-face time and nonface-to-face time personally spent on review of chart (labs), discussing labs, discussing further work-up, treatment options, referrals to specialist if needed, and answering patient's questions.    Delford Felling, FNP [facility] [phone]

## 2023-12-28 NOTE — Assessment & Plan Note (Addendum)
 UTI I have prescribed Keflex  500 mg by mouth TWICE A DAY for 7 days Your symptoms should gradually improve. Call if the burning worsens, you develop a fever, back pain or pelvic pain or if your symptom do not resolve after completing the antibiotic. Drink plenty of fluids Complete the full course of antibiotics even if the symptoms resolve Remember to wipe from front to back and don't hold it in! If possible, empty your bladder every 4 hours. - urine culture sent - discussed referral to Urologist for recurrent UTI's

## 2023-12-28 NOTE — Assessment & Plan Note (Signed)
 Na 125 -  12/20/23 Cause undetermined. - Order blood work to assess sodium levels and other parameters.

## 2023-12-29 LAB — CREATININE, URINE, RANDOM: Creatinine, Urine: 32.1 mg/dL

## 2023-12-29 LAB — SODIUM, URINE, RANDOM: Sodium, Ur: 40 mmol/L

## 2023-12-29 LAB — OSMOLALITY: Osmolality Meas: 276 mosm/kg — ABNORMAL LOW (ref 280–301)

## 2023-12-29 LAB — OSMOLALITY, URINE: Osmolality, Ur: 334 mosm/kg

## 2024-01-02 ENCOUNTER — Encounter: Payer: Self-pay | Admitting: Family Medicine

## 2024-01-02 ENCOUNTER — Other Ambulatory Visit: Payer: Self-pay

## 2024-01-02 ENCOUNTER — Other Ambulatory Visit

## 2024-01-02 ENCOUNTER — Telehealth: Payer: Self-pay | Admitting: Cardiology

## 2024-01-02 DIAGNOSIS — E875 Hyperkalemia: Secondary | ICD-10-CM

## 2024-01-02 LAB — URINE CULTURE

## 2024-01-02 NOTE — Telephone Encounter (Signed)
 Dr. Reinhold Carbine, patient's PCP, reached out to me this AM about patient having hyperkalemia and hyponatremia on her recent labs. On 4/24, labs showed K 5.9, Na 129, creatinine 1.29 (appears to be near her baseline). Patient is currently on spironolactone  25 mg daily, lisinopril  20 mg daily, farxiga  10 mg daily, carvedilol  12.5 mg BID for heart failure GDMT. In the past, we have tried to transition her to entresto, but patient was concerned about developing orthostatic hypotension and preferred to stay on lisinopril .   Dr. Reinhold Carbine planning to recheck BMP today. We discussed stopping spironolactone  if K remains elevated. Also discussed possibly being able to get patient on entresto if we have to stop spironolactone . To further discuss medication changes, I made patient an appointment with our clinic on 5/15 at 1:55 PM. Dr. Reinhold Carbine will make patient aware of appointment and let her know to call us  to reschedule if needed   Debria Fang, PA-C 01/02/2024 8:38 AM

## 2024-01-03 ENCOUNTER — Encounter: Payer: Self-pay | Admitting: Family Medicine

## 2024-01-03 ENCOUNTER — Other Ambulatory Visit: Payer: Self-pay

## 2024-01-03 LAB — COMPREHENSIVE METABOLIC PANEL WITH GFR
ALT: 25 IU/L (ref 0–32)
AST: 25 IU/L (ref 0–40)
Albumin: 3.8 g/dL (ref 3.8–4.8)
Alkaline Phosphatase: 99 IU/L (ref 44–121)
BUN/Creatinine Ratio: 28 (ref 12–28)
BUN: 32 mg/dL — ABNORMAL HIGH (ref 8–27)
Bilirubin Total: 0.4 mg/dL (ref 0.0–1.2)
CO2: 20 mmol/L (ref 20–29)
Calcium: 8.8 mg/dL (ref 8.7–10.3)
Chloride: 100 mmol/L (ref 96–106)
Creatinine, Ser: 1.13 mg/dL — ABNORMAL HIGH (ref 0.57–1.00)
Globulin, Total: 1.8 g/dL (ref 1.5–4.5)
Glucose: 157 mg/dL — ABNORMAL HIGH (ref 70–99)
Potassium: 4.9 mmol/L (ref 3.5–5.2)
Sodium: 135 mmol/L (ref 134–144)
Total Protein: 5.6 g/dL — ABNORMAL LOW (ref 6.0–8.5)
eGFR: 49 mL/min/{1.73_m2} — ABNORMAL LOW (ref >59)

## 2024-01-03 MED ORDER — ASPIRIN 81 MG PO TBEC: 81.0000 mg | DELAYED_RELEASE_TABLET | Freq: Every day | ORAL | 3 refills | Status: AC

## 2024-01-04 ENCOUNTER — Encounter: Payer: Self-pay | Admitting: Family Medicine

## 2024-01-04 ENCOUNTER — Other Ambulatory Visit: Payer: Self-pay

## 2024-01-04 ENCOUNTER — Telehealth: Payer: Self-pay

## 2024-01-04 DIAGNOSIS — N1831 Chronic kidney disease, stage 3a: Secondary | ICD-10-CM | POA: Diagnosis not present

## 2024-01-04 DIAGNOSIS — E871 Hypo-osmolality and hyponatremia: Secondary | ICD-10-CM | POA: Diagnosis not present

## 2024-01-04 DIAGNOSIS — D631 Anemia in chronic kidney disease: Secondary | ICD-10-CM | POA: Diagnosis not present

## 2024-01-04 DIAGNOSIS — I130 Hypertensive heart and chronic kidney disease with heart failure and stage 1 through stage 4 chronic kidney disease, or unspecified chronic kidney disease: Secondary | ICD-10-CM | POA: Diagnosis not present

## 2024-01-04 DIAGNOSIS — I5021 Acute systolic (congestive) heart failure: Secondary | ICD-10-CM | POA: Diagnosis not present

## 2024-01-04 DIAGNOSIS — Z48812 Encounter for surgical aftercare following surgery on the circulatory system: Secondary | ICD-10-CM | POA: Diagnosis not present

## 2024-01-04 DIAGNOSIS — I088 Other rheumatic multiple valve diseases: Secondary | ICD-10-CM | POA: Diagnosis not present

## 2024-01-04 DIAGNOSIS — I5033 Acute on chronic diastolic (congestive) heart failure: Secondary | ICD-10-CM | POA: Diagnosis not present

## 2024-01-04 DIAGNOSIS — E119 Type 2 diabetes mellitus without complications: Secondary | ICD-10-CM | POA: Diagnosis not present

## 2024-01-04 MED ORDER — FUROSEMIDE 20 MG PO TABS: 20.0000 mg | ORAL_TABLET | Freq: Every day | ORAL | 0 refills | Status: DC

## 2024-01-04 NOTE — Telephone Encounter (Signed)
 Patient called and stated she got your message about starting lasix  20 mg daily for swelling. Do not restart spironolactone . She wanted to know do you think that the spironolactone  is what was causing her labs to be off and not the UTI? Please Advise.  I have removed Spironolactone  and send lasix  20 mg daily to pharmacy.

## 2024-01-08 DIAGNOSIS — N1831 Chronic kidney disease, stage 3a: Secondary | ICD-10-CM | POA: Diagnosis not present

## 2024-01-08 DIAGNOSIS — I5033 Acute on chronic diastolic (congestive) heart failure: Secondary | ICD-10-CM | POA: Diagnosis not present

## 2024-01-08 DIAGNOSIS — E119 Type 2 diabetes mellitus without complications: Secondary | ICD-10-CM | POA: Diagnosis not present

## 2024-01-08 DIAGNOSIS — E871 Hypo-osmolality and hyponatremia: Secondary | ICD-10-CM | POA: Diagnosis not present

## 2024-01-08 DIAGNOSIS — I5021 Acute systolic (congestive) heart failure: Secondary | ICD-10-CM | POA: Diagnosis not present

## 2024-01-08 DIAGNOSIS — D631 Anemia in chronic kidney disease: Secondary | ICD-10-CM | POA: Diagnosis not present

## 2024-01-08 DIAGNOSIS — Z48812 Encounter for surgical aftercare following surgery on the circulatory system: Secondary | ICD-10-CM | POA: Diagnosis not present

## 2024-01-08 DIAGNOSIS — I130 Hypertensive heart and chronic kidney disease with heart failure and stage 1 through stage 4 chronic kidney disease, or unspecified chronic kidney disease: Secondary | ICD-10-CM | POA: Diagnosis not present

## 2024-01-08 DIAGNOSIS — I088 Other rheumatic multiple valve diseases: Secondary | ICD-10-CM | POA: Diagnosis not present

## 2024-01-17 ENCOUNTER — Ambulatory Visit (HOSPITAL_BASED_OUTPATIENT_CLINIC_OR_DEPARTMENT_OTHER): Admitting: Radiology

## 2024-01-17 ENCOUNTER — Encounter (HOSPITAL_BASED_OUTPATIENT_CLINIC_OR_DEPARTMENT_OTHER): Payer: Self-pay

## 2024-01-17 ENCOUNTER — Ambulatory Visit (HOSPITAL_BASED_OUTPATIENT_CLINIC_OR_DEPARTMENT_OTHER)
Admission: EM | Admit: 2024-01-17 | Discharge: 2024-01-17 | Disposition: A | Discharge status: Home / Self Care | Attending: Family Medicine | Admitting: Family Medicine

## 2024-01-17 DIAGNOSIS — S99922A Unspecified injury of left foot, initial encounter: Secondary | ICD-10-CM

## 2024-01-17 DIAGNOSIS — M85872 Other specified disorders of bone density and structure, left ankle and foot: Secondary | ICD-10-CM | POA: Diagnosis not present

## 2024-01-17 DIAGNOSIS — R531 Weakness: Secondary | ICD-10-CM | POA: Diagnosis not present

## 2024-01-17 LAB — POCT URINALYSIS DIP (MANUAL ENTRY)
Bilirubin, UA: NEGATIVE
Blood, UA: NEGATIVE
Glucose, UA: 500 mg/dL — AB
Ketones, POC UA: NEGATIVE mg/dL — AB
Nitrite, UA: NEGATIVE
Protein Ur, POC: NEGATIVE mg/dL
Spec Grav, UA: 1.01 (ref 1.010–1.025)
Urobilinogen, UA: 0.2 U/dL
pH, UA: 6 (ref 5.0–8.0)

## 2024-01-17 NOTE — Discharge Instructions (Signed)
 Your x-ray did not show any fractures.  Most likely bad sprain.  Recommend rest, ice, elevate and Tylenol  for pain as needed. You can put Ace wrap for compression if this helps.  Try to stay off as much as possible Urine not convincing of a urinary infection but we will culture it.  If this comes back positive we will treat Follow-up with the cardiology PA tomorrow as planned
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
## 2024-01-17 NOTE — ED Provider Notes (Signed)
 [name]    CSN: [id number] Arrival date & time: 01/17/24  1644      History   Chief Complaint Chief Complaint  Patient presents with   Foot Injury    HPI Pamela [name] is a 78 y.o. female.   Pt is a 78 year old female with significant PMH to include A-fib, chronic kidney disease, DVT, PE, TIA that presents with left foot/ankle injury. Reports that she was outside of the grocery store when she "felt everything go dark" lowering to the ground between the car and the shopping cart. She did not fully lose consciousness.  She did not hit her head.  She believes that her foots at that time twisted in a weird position. She has since had pain to the left foot. Previous surgery to this foot in the past.  She is not sure what caused her the syncope but she believes that her BP dropped. She has had changes to her medications recently with previously been on spironolactone  and now on Lasix .  Reports she has been eating and drinking normally.  Blood sugars have been in good range and not low.  She has follow-up with cardiology tomorrow at 2:00. No chest pain, SOB, dizziness.    Foot Injury   Past Medical History:  Diagnosis Date   Anticoagulated on Coumadin     chronic--- managed by hematology/ oncology,   Asthma, mild intermittent    followed by pcp--- per pt last exacerbation winter 2021 w/ acute bronchitis   Bilateral leg cramps    Blood dyscrasia    Chronic constipation    CKD (chronic kidney disease), stage III (HCC)    Closed bimalleolar fracture of left ankle 02/26/2020   DDD (degenerative disc disease), cervical    w/ spondylosis,  per pt last steroid injection 06/ 2022   DDD (degenerative disc disease), lumbosacral    DVT (deep venous thrombosis) (HCC) 07/30/2013   Dyspnea    occasionally   Dysrhythmia    GERD (gastroesophageal reflux disease)    History of cardiac murmur as a child    History of DVT of lower extremity    left lower extremity in  1980s, fell when bowling   History of pulmonary embolus (PE) 1993   per pt left lung post op 2 wks cholecystectomy   History of rheumatic fever as a child    per last echo 12-31-2019 no valvular issues   History of TIA (transient ischemic attack)    2014 and 2018 or 2019,  per pt no residual   HTN (hypertension)    followed by pcp   Hyperlipidemia    Hypothyroidism    IDA (iron deficiency anemia)    Macular degeneration of both eyes    Mild obstructive sleep apnea    per pt dx 2017 tried to uses cpap but intolerant   Mixed incontinence urge and stress    urologist--- dr Reginal Capra   OA (osteoarthritis) 07/06/2018   Osteoporosis    PAF (paroxysmal atrial fibrillation) (HCC) 10/01/2014   cardiologist--- dr Chyrl Crawford tobb;   cardiac cath 02-18-2013 normal coronaries arteries, ef 50%, cath done since echo showed ef 30-35%; nucleat stress study 04/ 2020 normal , normal echo 04/ 2021,  event monitor 09-14-2020 rare ST/AT variable block   Pneumonia    PONV (postoperative nausea and vomiting)    Protein S deficiency (HCC)    followed by hemotology/ oncology-- dr d. Harles Lied ([facility]) dx 1980s;  prior DVT left lower  leg 1980s and left lung PE 1993; chronic  coumadin  since 1980s   PVC's (premature ventricular contractions)    followed by cardiology   S/P cardiac catheterization 02/2013   Normal coronaries; low normal EF at 50%   Solitary pulmonary nodule on lung CT 02/06/2019   First noted 01/13/2014 > no change as of 12/21/2018   Spondylolisthesis, lumbar region 08/09/2018   Stroke (HCC)    TIA in 2018 or 2019   Transient ischemic attack 07/30/2013   Type 2 diabetes mellitus treated with insulin  (HCC)    endocrilogist--- whitney reardon NP     (03-10-2021  pt continuously checks blood sugar throughout the day w/ Libre, fasting sugar --- 69--200)   Wears glasses    Wears hearing aid in both ears     Patient Active Problem List   Diagnosis Date Noted   Acute combined systolic  and diastolic heart failure (HCC) 12/08/2023   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   Acute heart failure with mildly reduced ejection fraction (HFmrEF, 41-49%) (HCC) 12/07/2023   CKD stage 3a, GFR 45-59 ml/min (HCC) 11/05/2023   Encounter for immunization 07/20/2023   Cramps of left lower extremity 07/20/2023   Abnormal CT of the chest 07/20/2023   Dysuria 04/17/2023   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/22/2022   Compression fracture of L1 lumbar vertebra (HCC) 12/17/2022   Stage 3b chronic kidney disease (HCC) 12/17/2022   Subclinical hypothyroidism 12/14/2022   Pain in joint of right hip 08/11/2022   Acute cystitis without hematuria 06/12/2022   Pain in left foot 12/17/2021   Closed fracture of fifth metatarsal bone of left foot 12/06/2021   Joint pain 10/24/2021   Muscle pain 10/24/2021   Hammer toe 08/23/2021   Bunion 08/23/2021   Lumbar radiculopathy 08/13/2021   Spinal stenosis of lumbar region 07/19/2021   Cervical radiculopathy 07/19/2021   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 (HCC) 06/27/2021   Acquired thrombophilia (HCC) 06/27/2021   Osteopenia 06/27/2021   Mixed hyperlipidemia 06/27/2021   Type 2 diabetes mellitus with diabetic neuropathy, with long-term current use of insulin  (HCC) 06/15/2021   Closed fracture of fourth metatarsal bone 05/07/2021   Chronic diastolic congestive heart failure (HCC) 12/09/2020   Type 2 diabetes mellitus with stage 3 chronic kidney disease (HCC) 12/09/2020   Asthma    Chronic constipation    DJD (degenerative joint disease)    GERD (gastroesophageal reflux disease)    Heart murmur    Uncontrolled hypertension    Sleep apnea    Hyponatremia 02/06/2019   Solitary pulmonary nodule on lung CT 02/06/2019   Spondylolisthesis, lumbar region 08/09/2018   Osteoarthritis of right knee 07/06/2018   Atrial fibrillation [I48.91] 10/01/2014   Protein S deficiency (HCC)  03/03/2013   DOE (dyspnea on exertion) 03/03/2013   Ventricular premature beats 05/23/2011   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, NYHA class 1 (HCC) 05/23/2011    Past Surgical History:  Procedure Laterality Date   ABDOMINAL HYSTERECTOMY     BLEPHAROPLASTY     CARDIAC CATHETERIZATION  02/18/2013   @ [facility]  by Dr [place];  normal coronaries w/ preserved LVF, ef 50%;   previous cath 2001 normal ef 65%   CARPAL TUNNEL RELEASE Bilateral 1994   CATARACT EXTRACTION W/ INTRAOCULAR LENS IMPLANT Bilateral 2017   CHOLECYSTECTOMY     CHOLECYSTECTOMY, LAPAROSCOPIC  1993   COLONOSCOPY     EAR BIOPSY Left    FINGER SURGERY Left 2018  thumb   FOOT SURGERY     FOOT TENDON SURGERY Right    early 2000s   FRACTURE SURGERY Left 02/13/2020   left femur   HAND SURGERY Right 04/2022   HARDWARE REMOVAL Left 03/12/2021   Procedure: HARDWARE REMOVAL;  Surgeon: Janeth Medicus, MD;  Location: [facility];  Service: Orthopedics;  Laterality: Left;  60 MINS   JOINT REPLACEMENT     LEFT HEART CATH AND CORONARY ANGIOGRAPHY N/A 12/07/2023   Procedure: LEFT HEART CATH AND CORONARY ANGIOGRAPHY;  Surgeon: Odie Benne, MD;  Location: [facility] INV[facility]IVE [facility];  Service: Cardiovascular;  Laterality: N/A;   LUMBAR LAMINECTOMY/DECOMPRESSION MICRODISCECTOMY N/A 12/22/2022   Procedure: Lumbar Two-Lumbar Three, Lumbar Three-Lumbar Four Lumbar Decompression;  Surgeon: Elna Haggis, MD;  Location: [facility] OR;  Service: Neurosurgery;  Laterality: N/A;  RM 20 3C   ORIF ANKLE FRACTURE Left 02/26/2020   Procedure: OPEN REDUCTION INTERNAL FIXATION (ORIF) ANKLE FRACTURE;  Surgeon: Janeth Medicus, MD;  Location: [facility] OR;  Service: Orthopedics;  Laterality: Left;  90 mins   TONSILLECTOMY AND ADENOIDECTOMY Bilateral    TOTAL KNEE ARTHROPLASTY Left 03/2005   TOTAL VAGINAL HYSTERECTOMY  1988   per pt still has ovaries    OB History   No obstetric history on file.      Home Medications     Prior to Admission medications   Medication Sig Start Date End Date Taking? Authorizing Provider  acetaminophen  (TYLENOL ) 650 MG CR tablet Take 650-1,300 mg by mouth every 8 (eight) hours as needed for pain.    [provider]  albuterol  (VENTOLIN  HFA) 108 (90 Base) MCG/ACT inhaler INHALE 1 TO 2 PUFFS INTO THE LUNGS EVERY 6 HOURS AS NEEDED FOR WHEEZING OR SHORTNESS OF BREATH 02/04/22   Cyndi Drain, PA-C  aspirin  EC (ASPIRIN  LOW DOSE) 81 MG tablet Take 1 tablet (81 mg total) by mouth daily. Swallow whole. 01/03/24   Tobb, Kardie, DO  atorvastatin  (LIPITOR ) 80 MG tablet TAKE 1 TABLET(80 MG) BY MOUTH DAILY 08/07/23   Tobb, Kardie, DO  carboxymethylcellulose (REFRESH PLUS) 0.5 % SOLN Place 1 drop into both eyes 3 (three) times daily as needed (dry eyes).    [provider]  carvedilol  (COREG ) 12.5 MG tablet Take 1 tablet (12.5 mg total) by mouth 2 (two) times daily with a meal. 12/20/23   Odilia Bennett, PA  Cholecalciferol (VITAMIN D3) 50 MCG (2000 UT) TABS Take 4,000 Units by mouth in the morning and at bedtime.    [provider]  Continuous Glucose Sensor (FREESTYLE LIBRE 2 SENSOR) MISC 2 each by Does not apply route every 14 (fourteen) days. E11.40 10/05/23   Shamleffer, Julian Obey, MD  cyanocobalamin  (VITAMIN B12) 1000 MCG tablet Take 1,000 mcg by mouth daily with lunch.    [provider]  dapagliflozin  propanediol (FARXIGA ) 10 MG TABS tablet Take 1 tablet (10 mg total) by mouth daily before breakfast. 10/05/23   Shamleffer, Julian Obey, MD  Dulaglutide  (TRULICITY ) 0.75 MG/0.5ML SOAJ Inject 0.75 mg into the skin once a week. 10/05/23   Shamleffer, Ibtehal Jaralla, MD  ELIQUIS  5 MG TABS tablet TAKE 1 TABLET(5 MG) BY MOUTH TWICE DAILY 12/01/23   Mosher, Alejandra Amos A, PA-C  estradiol (ESTRACE) 0.1 MG/GM vaginal cream Place 1 Applicatorful vaginally 2 (two) times a week. As needed 06/10/20   [provider]  ferrous sulfate  325 (65 FE) MG tablet Take 325 mg by  mouth daily with lunch.    [provider]  furosemide  (  LASIX ) 20 MG tablet Take 1 tablet (20 mg total) by mouth daily. 01/04/24   CoxBurleigh Carp, MD  GEMTESA  75 MG TABS Take 1 tablet (75 mg total) by mouth at bedtime. 12/20/23   Odilia Bennett, PA  insulin  aspart (NOVOLOG  FLEXPEN) 100 UNIT/ML FlexPen Max daily 30 units Patient taking differently: Inject 6 Units into the skin 3 (three) times daily with meals. Sliding scale Max daily 30 units 10/05/23   Shamleffer, Ibtehal Jaralla, MD  Insulin  Pen Needle (BD PEN NEEDLE NANO 2ND GEN) 32G X 4 MM MISC 1 Device by Other route in the morning, at noon, in the evening, and at bedtime. USE TO INJECT INSULIN  four times DAILY Patient taking differently: 1 Device by Other route 3 (three) times daily. USE TO INJECT INSULIN  four times DAILY 10/05/23   Shamleffer, Ibtehal Jaralla, MD  isosorbide  mononitrate (IMDUR ) 30 MG 24 hr tablet Take 1 tablet (30 mg total) by mouth daily. 12/14/23   Debria Fang, PA-C  lisinopril  (ZESTRIL ) 20 MG tablet TAKE 1 TABLETS BY MOUTH TWICE DAILY 07/17/23   Cox, Burleigh Carp, MD  magnesium  oxide (MAG-OX) 400 (240 Mg) MG tablet TAKE 2 TABLETS(800 MG) BY MOUTH TWICE DAILY Patient taking differently: Take 800 mg by mouth 2 (two) times daily. 02/15/21   Audie Bleacher, MD  Multiple Vitamins-Minerals (PRESERVISION AREDS PO) Take 1 capsule by mouth 2 (two) times daily.    [provider]  nystatin cream (MYCOSTATIN) Apply 1 Application topically 4 (four) times daily as needed for dry skin (yeast infection).    [provider]  ondansetron  (ZOFRAN -ODT) 8 MG disintegrating tablet Take 8 mg by mouth every 8 (eight) hours as needed for nausea or vomiting.     [provider]  pantoprazole  (PROTONIX ) 40 MG tablet TAKE 1 TABLET(40 MG) BY MOUTH EVERY MORNING 11/05/23   Cox, Kirsten, MD  pyridoxine (B-6) 100 MG tablet Take 100 mg by mouth daily with lunch.    [provider]  sennosides-docusate sodium   (SENOKOT-S) 8.6-50 MG tablet Take 1 tablet by mouth daily as needed for constipation.    [provider]  sodium chloride  (OCEAN) 0.65 % SOLN nasal spray Place 1 spray into both nostrils as needed for congestion.    [provider]  zolpidem  (AMBIEN ) 5 MG tablet TAKE 1 TABLET(5 MG) BY MOUTH AT BEDTIME AS NEEDED FOR SLEEP Patient taking differently: Take 5 mg by mouth at bedtime. 10/14/23   CoxBurleigh Carp, MD    Family History Family History  Problem Relation Age of Onset   Cirrhosis Mother    Antithrombin III deficiency Mother        multiple emboli   Ulcerative colitis Mother    Diabetes Father    Coronary artery disease Father    Heart attack Father    Hypertension Father    Kidney disease Father    Hypertension Sister    Diabetes Sister    Heart attack Sister        age 28    Protein S deficiency Daughter    Heart attack Brother    Hypertension Brother    Lung cancer Brother    Stroke Neg Hx    Colitis Neg Hx    Colon polyps Neg Hx    Esophageal cancer Neg Hx    Liver cancer Neg Hx    Pancreatic cancer Neg Hx    Rectal cancer Neg Hx    Stomach cancer Neg Hx    Breast cancer Neg Hx  Social History Social History   Tobacco Use   Smoking status: Never   Smokeless tobacco: Never  Vaping Use   Vaping status: Never Used  Substance Use Topics   Alcohol use: No   Drug use: Never     Allergies   Ketek [telithromycin], Loxapine succinate, Macrodantin  [nitrofurantoin  macrocrystal], Naproxen sodium, Amoxapine and related, Darvon, Belsomra [suvorexant], Cymbalta [duloxetine hcl], Duloxetine, Gabapentin , Lyrica  [pregabalin ], Ozempic  (0.25 or 0.5 mg-dose) [semaglutide (0.25 or 0.5mg -dos)], Semaglutide , Amoxicillin, and Propoxyphene   Review of Systems Review of Systems  See HPI Physical Exam Triage Vital Signs ED Triage Vitals  Encounter Vitals Group     BP 01/17/24 1655 (!) 146/70     Systolic BP Percentile --      Diastolic BP Percentile --       Pulse Rate 01/17/24 1655 (!) 57     Resp 01/17/24 1655 20     Temp 01/17/24 1655 (!) 97.5 F (36.4 C)     Temp Source 01/17/24 1655 Oral     SpO2 01/17/24 1655 97 %     Weight --      Height --      Head Circumference --      Peak Flow --      Pain Score 01/17/24 1656 10     Pain Loc --      Pain Education --      Exclude from Growth Chart --    No data found.  Updated Vital Signs BP (!) 146/70 (BP Location: Left Arm)   Pulse (!) 57   Temp (!) 97.5 F (36.4 C) (Oral)   Resp 20   SpO2 97%   Visual Acuity Right Eye Distance:   Left Eye Distance:   Bilateral Distance:    Right Eye Near:   Left Eye Near:    Bilateral Near:     Physical Exam Vitals and nursing note reviewed.  Constitutional:      General: She is not in acute distress.    Appearance: Normal appearance. She is not ill-appearing, toxic-appearing or diaphoretic.  HENT:     Head: Normocephalic and atraumatic.     Right Ear: Tympanic membrane and ear canal normal.     Left Ear: Tympanic membrane and ear canal normal.     Mouth/Throat:     Pharynx: Oropharynx is clear.  Eyes:     Conjunctiva/sclera: Conjunctivae normal.  Cardiovascular:     Rate and Rhythm: Normal rate and regular rhythm.     Pulses: Normal pulses.     Heart sounds: Normal heart sounds.  Pulmonary:     Effort: Pulmonary effort is normal.     Breath sounds: Normal breath sounds.  Musculoskeletal:        General: Tenderness and signs of injury present. No deformity. Normal range of motion.  Skin:    General: Skin is warm and dry.  Neurological:     Mental Status: She is alert.  Psychiatric:        Mood and Affect: Mood normal.      UC Treatments / Results  Labs (all labs ordered are listed, but only abnormal results are displayed) Labs Reviewed - No data to display  EKG   Radiology No results found.  Procedures Procedures (including critical care time)  Medications Ordered in UC Medications - No data to  display  Initial Impression / Assessment and Plan / UC Course  I have reviewed the triage vital signs and the nursing notes.  Pertinent labs &  imaging results that were available during my care of the patient were reviewed by me and considered in my medical decision making (see chart for details).     *** Final Clinical Impressions(s) / UC Diagnoses   Final diagnoses:  Injury of left foot, initial encounter   Discharge Instructions   None    ED Prescriptions   None    PDMP not reviewed this encounter.

## 2024-01-18 ENCOUNTER — Ambulatory Visit

## 2024-01-18 ENCOUNTER — Other Ambulatory Visit: Payer: Self-pay | Admitting: Nurse Practitioner

## 2024-01-18 ENCOUNTER — Ambulatory Visit: Attending: Nurse Practitioner | Admitting: Nurse Practitioner

## 2024-01-18 VITALS — BP 131/70 | HR 72 | Ht 60.0 in | Wt 148.0 lb

## 2024-01-18 DIAGNOSIS — I10 Essential (primary) hypertension: Secondary | ICD-10-CM

## 2024-01-18 DIAGNOSIS — I480 Paroxysmal atrial fibrillation: Secondary | ICD-10-CM | POA: Diagnosis not present

## 2024-01-18 DIAGNOSIS — I428 Other cardiomyopathies: Secondary | ICD-10-CM

## 2024-01-18 DIAGNOSIS — R55 Syncope and collapse: Secondary | ICD-10-CM

## 2024-01-18 DIAGNOSIS — I4729 Other ventricular tachycardia: Secondary | ICD-10-CM

## 2024-01-18 DIAGNOSIS — R002 Palpitations: Secondary | ICD-10-CM

## 2024-01-18 DIAGNOSIS — I5022 Chronic systolic (congestive) heart failure: Secondary | ICD-10-CM

## 2024-01-18 DIAGNOSIS — D6859 Other primary thrombophilia: Secondary | ICD-10-CM

## 2024-01-18 DIAGNOSIS — E785 Hyperlipidemia, unspecified: Secondary | ICD-10-CM

## 2024-01-18 DIAGNOSIS — R0609 Other forms of dyspnea: Secondary | ICD-10-CM

## 2024-01-18 DIAGNOSIS — N1831 Chronic kidney disease, stage 3a: Secondary | ICD-10-CM

## 2024-01-18 DIAGNOSIS — I2510 Atherosclerotic heart disease of native coronary artery without angina pectoris: Secondary | ICD-10-CM

## 2024-01-18 DIAGNOSIS — I493 Ventricular premature depolarization: Secondary | ICD-10-CM

## 2024-01-18 DIAGNOSIS — I951 Orthostatic hypotension: Secondary | ICD-10-CM | POA: Diagnosis not present

## 2024-01-18 NOTE — Patient Instructions (Signed)
 Medication Instructions:  Furosemide  20 mg daily as needed for swelling or weight gain.  *If you need a refill on your cardiac medications before your next appointment, please call your pharmacy*  Lab Work: NONE ordered at this time of appointment    Testing/Procedures: Your physician has recommended that you wear an event monitor for 30 days. Event monitors are medical devices that record the heart's electrical activity. Doctors most often us  these monitors to diagnose arrhythmias. Arrhythmias are problems with the speed or rhythm of the heartbeat. The monitor is a small, portable device. You can wear one while you do your normal daily activities. This is usually used to diagnose what is causing palpitations/syncope (passing out).   Follow-Up: At [facility], you and your health needs are our priority.  As part of our continuing mission to provide you with exceptional heart care, our providers are all part of one team.  This team includes your primary Cardiologist (physician) and Advanced Practice Providers or APPs (Physician Assistants and Nurse Practitioners) who all work together to provide you with the care you need, when you need it.  Your next appointment:    Keep apt with Dr. Emmette Harms  Provider:   Kardie Tobb, DO    We recommend signing up for the patient portal called "MyChart".  Sign up information is provided on this After Visit Summary.  MyChart is used to connect with patients for Virtual Visits (Telemedicine).  Patients are able to view lab/test results, encounter notes, upcoming appointments, etc.  Non-urgent messages can be sent to your provider as well.   To learn more about what you can do with MyChart, go to [email or web address].   Other Instructions Monitor blood pressures. Report systolic BP (top number) consistently less thatn 110 or greater than 150.  Please call your Primary Care Physician for concerning urinary track symptoms.   Weigh yourself EVERY  morning after you go to the bathroom but before you eat or drink anything. Write this number down in a weight log/diary. If you gain 3 pounds overnight or 5 pounds in a week, call the office. Take your medicines as prescribed. If you have concerns about your medications, please call us  before you stop taking them. Eat low salt foods--Limit salt (sodium) to 2000 mg per day. This will help prevent your body from holding onto fluid. Read food labels as many processed foods have a lot of sodium, especially canned goods and prepackaged meats. If you would like some assistance choosing low sodium foods, we would be happy to set you up with a nutritionist. Limit all fluids for the day to less than 2 liters (64 ounces). Fluid includes all drinks, coffee, juice, ice chips, soup, jello, and all other liquids. Stay as active as you can everyday. Staying active will give you more energy and make your muscles stronger. Start with 5 minutes at a time and work your way up to 30 minutes a day. Break up your activities--do some in the morning and some in the afternoon. Start with 3 days per week and work your way up to 5 days as you can.  If you have chest pain, feel short of breath, dizzy, or lightheaded, STOP. If you don't feel better after a short rest, call 911. If you do feel better, call the office to let us  know you have symptoms with exercise.

## 2024-01-18 NOTE — Progress Notes (Signed)
 Office Visit    Patient Name: Pamela [name] Date of Encounter: 01/18/2024  Primary Care Provider:  [name], MD Primary Cardiologist:  Kardie Tobb, DO  Chief Complaint    78 year old female with a history of CAD, chronic HRmrEF, paroxysmal atrial fibrillation, hypertension, orthostatic hypotension, hyperlipidemia, history of TIA, type 2 diabetes, hypothyroidism, CKD stage III, and protein S deficiency who presents for follow-up related to heart failure and hypertension.  Past Medical History    Past Medical History:  Diagnosis Date   Anticoagulated on Coumadin     chronic--- managed by hematology/ oncology,   Asthma, mild intermittent    followed by pcp--- per pt last exacerbation winter 2021 w/ acute bronchitis   Bilateral leg cramps    Blood dyscrasia    Chronic constipation    CKD (chronic kidney disease), stage III (HCC)    Closed bimalleolar fracture of left ankle 02/26/2020   DDD (degenerative disc disease), cervical    w/ spondylosis,  per pt last steroid injection 06/ 2022   DDD (degenerative disc disease), lumbosacral    DVT (deep venous thrombosis) (HCC) 07/30/2013   Dyspnea    occasionally   Dysrhythmia    GERD (gastroesophageal reflux disease)    History of cardiac murmur as a child    History of DVT of lower extremity    left lower extremity in 1980s, fell when bowling   History of pulmonary embolus (PE) 1993   per pt left lung post op 2 wks cholecystectomy   History of rheumatic fever as a child    per last echo 12-31-2019 no valvular issues   History of TIA (transient ischemic attack)    2014 and 2018 or 2019,  per pt no residual   HTN (hypertension)    followed by pcp   Hyperlipidemia    Hypothyroidism    IDA (iron deficiency anemia)    Macular degeneration of both eyes    Mild obstructive sleep apnea    per pt dx 2017 tried to uses cpap but intolerant   Mixed incontinence urge and stress    urologist--- dr Reginal Capra   OA  (osteoarthritis) 07/06/2018   Osteoporosis    PAF (paroxysmal atrial fibrillation) (HCC) 10/01/2014   cardiologist--- dr Chyrl Crawford tobb;   cardiac cath 02-18-2013 normal coronaries arteries, ef 50%, cath done since echo showed ef 30-35%; nucleat stress study 04/ 2020 normal , normal echo 04/ 2021,  event monitor 09-14-2020 rare ST/AT variable block   Pneumonia    PONV (postoperative nausea and vomiting)    Protein S deficiency (HCC)    followed by hemotology/ oncology-- dr d. Harles Lied ([facility]) dx 1980s;  prior DVT left lower leg 1980s and left lung PE 1993; chronic  coumadin  since 1980s   PVC's (premature ventricular contractions)    followed by cardiology   S/P cardiac catheterization 02/2013   Normal coronaries; low normal EF at 50%   Solitary pulmonary nodule on lung CT 02/06/2019   First noted 01/13/2014 > no change as of 12/21/2018   Spondylolisthesis, lumbar region 08/09/2018   Stroke (HCC)    TIA in 2018 or 2019   Transient ischemic attack 07/30/2013   Type 2 diabetes mellitus treated with insulin  (HCC)    endocrilogist--- whitney reardon NP     (03-10-2021  pt continuously checks blood sugar throughout the day w/ Libre, fasting sugar --- 69--200)   Wears glasses    Wears hearing aid in both ears    Past  Surgical History:  Procedure Laterality Date   ABDOMINAL HYSTERECTOMY     BLEPHAROPLASTY     CARDIAC CATHETERIZATION  02/18/2013   @ [facility]  by Dr [place];  normal coronaries w/ preserved LVF, ef 50%;   previous cath 2001 normal ef 65%   CARPAL TUNNEL RELEASE Bilateral 1994   CATARACT EXTRACTION W/ INTRAOCULAR LENS IMPLANT Bilateral 2017   CHOLECYSTECTOMY     CHOLECYSTECTOMY, LAPAROSCOPIC  1993   COLONOSCOPY     EAR BIOPSY Left    FINGER SURGERY Left 2018   thumb   FOOT SURGERY     FOOT TENDON SURGERY Right    early 2000s   FRACTURE SURGERY Left 02/13/2020   left femur   HAND SURGERY Right 04/2022   HARDWARE REMOVAL Left 03/12/2021   Procedure: HARDWARE  REMOVAL;  Surgeon: Janeth Medicus, MD;  Location: [facility];  Service: Orthopedics;  Laterality: Left;  60 MINS   JOINT REPLACEMENT     LEFT HEART CATH AND CORONARY ANGIOGRAPHY N/A 12/07/2023   Procedure: LEFT HEART CATH AND CORONARY ANGIOGRAPHY;  Surgeon: Odie Benne, MD;  Location: [facility] INVASIVE CV LAB;  Service: Cardiovascular;  Laterality: N/A;   LUMBAR LAMINECTOMY/DECOMPRESSION MICRODISCECTOMY N/A 12/22/2022   Procedure: Lumbar Two-Lumbar Three, Lumbar Three-Lumbar Four Lumbar Decompression;  Surgeon: Elna Haggis, MD;  Location: [facility] OR;  Service: Neurosurgery;  Laterality: N/A;  RM 20 3C   ORIF ANKLE FRACTURE Left 02/26/2020   Procedure: OPEN REDUCTION INTERNAL FIXATION (ORIF) ANKLE FRACTURE;  Surgeon: Janeth Medicus, MD;  Location: [facility] OR;  Service: Orthopedics;  Laterality: Left;  90 mins   TONSILLECTOMY AND ADENOIDECTOMY Bilateral    TOTAL KNEE ARTHROPLASTY Left 03/2005   TOTAL VAGINAL HYSTERECTOMY  1988   per pt still has ovaries    Allergies  Allergies  Allergen Reactions   Ketek [Telithromycin] Nausea And Vomiting and Rash   Loxapine Succinate Hives   Macrodantin  [Nitrofurantoin  Macrocrystal] Other (See Comments)    Pulmonary fibrosis   Naproxen Sodium Anaphylaxis and Hives   Amoxapine And Related Hives   Darvon Nausea And Vomiting   Belsomra [Suvorexant] Other (See Comments)    "Nightmares"   Cymbalta [Duloxetine Hcl] Other (See Comments)    "Blackouts"   Duloxetine    Gabapentin  Other (See Comments)    Blurry vision   Lyrica  [Pregabalin ]     Makes her sedated   Ozempic  (0.25 Or 0.5 Mg-Dose) [Semaglutide (0.25 Or 0.5mg -Dos)] Diarrhea   Semaglutide  Diarrhea    Rybelsus    Amoxicillin  Rash   Propoxyphene Nausea And Vomiting     Labs/Other Studies Reviewed    The following studies were reviewed today:  Cardiac Studies & Procedures    ______________________________________________________________________________________________ CARDIAC CATHETERIZATION  CARDIAC CATHETERIZATION 12/07/2023  Conclusion No angiographic evidence of CAD LVEDP 27-30 mm Hg  Recommendations: She is volume overloaded and has uncontrolled HTN with renal insufficiency and hyponatremia. Will admit to telemetry and begin diuresis with IV Lasix . BMET in am. Follow BP closely and adjust medication as needed.  Findings Coronary Findings Diagnostic  Dominance: Right  Left Anterior Descending Vessel is large.  Left Circumflex Vessel is large.  Right Coronary Artery Vessel is large.  Intervention  No interventions have been documented.   STRESS TESTS  NM PET CT CARDIAC PERFUSION MULTI W/ABSOLUTE BLOODFLOW 05/16/2023  Narrative   Normal perfusion images.  Myocardial blood flow reserve is decreased, but this is due to high resting flows (stress flows are normal); this could represent microvascular disease.  No other  high risk findings such as TID or drop in EF with stress. Study is low risk   LV perfusion is normal. There is no evidence of ischemia. There is no evidence of infarction.   Rest left ventricular function is normal. Rest EF: 54%. Stress left ventricular function is normal. Stress EF: 65%. End diastolic cavity size is normal. End systolic cavity size is normal.   Myocardial blood flow was computed to be 1.5ml/g/min at rest and 2.32ml/g/min at stress. Global myocardial blood flow reserve was 1.48 and was abnormal.   Coronary calcium  was present on the attenuation correction CT images. Mild coronary calcifications were present. Coronary calcifications were present in the left anterior descending artery distribution(s).   Findings are consistent with no ischemia. The study is low risk.   Electronically signed by Carson Clara, MD  CLINICAL DATA:  This over-read does not include interpretation of cardiac or coronary anatomy or  pathology. The Cardiac PET CT interpretation by the cardiologist is attached.  COMPARISON:  CTA chest dated 06/06/2022  FINDINGS: Vascular: No evidence of thoracic aortic aneurysm. Atherosclerotic calcifications of the aortic arch.  The heart is top normal in size.  No pericardial effusion.  Mild coronary atherosclerosis of the LAD and left circumflex.  Mediastinum/Nodes: No suspicious mediastinal lymphadenopathy.  Lungs/Pleura: Mild platelike scarring/atelectasis in the right upper lobe. Mild lingular scarring/atelectasis.  Mild subpleural reticulation/fibrosis in the bilateral lower lobes, suggesting mild superimposed chronic interstitial lung disease.  No focal consolidation.  No suspicious pulmonary nodules.  No pleural effusion or pneumothorax.  Upper Abdomen: Visualized upper abdomen is notable for suspected prior cholecystectomy and vascular calcifications.  Musculoskeletal: Degenerative changes of the thoracic spine.  IMPRESSION: Mild subpleural reticulation/fibrosis in the bilateral lower lobes, suggesting mild chronic interstitial lung disease.  Otherwise, no significant extracardiac findings.  Aortic Atherosclerosis (ICD10-I70.0).   Electronically Signed By: Zadie Herter M.D. On: 05/16/2023 10:23   ECHOCARDIOGRAM  ECHOCARDIOGRAM COMPLETE 12/08/2023  Narrative ECHOCARDIOGRAM REPORT    Patient Name:   JASNOOR TRUSSELL Catarino Date of Exam: 12/08/2023 Medical Rec #:  [id number]                    Height:       61.0 in Accession #:    [id number]                   Weight:       137.8 lb Date of Birth:  Nov 12, 1945                    BSA:          1.612 m Patient Age:    78 years                     BP:           133/65 mmHg Patient Gender: F                            HR:           80 bpm. Exam Location:  Inpatient  Procedure: 2D Echo, Cardiac Doppler and Color Doppler (Both Spectral and Color Flow Doppler were utilized during  procedure).  Indications:     CHF-Acute Diastolic I50.31  History:         Patient has prior history of Echocardiogram examinations, most recent 04/25/2023. Stroke; Risk Factors:Hypertension and Diabetes.  Sonographer:     Hilario Lover  Clark Referring Phys:  [id number] Odie Benne Diagnosing Phys: Jules Oar MD  IMPRESSIONS   1. Left ventricular ejection fraction, by estimation, is 45 to 50%. The left ventricle has mildly decreased function. The left ventricle has no regional wall motion abnormalities. The left ventricular internal cavity size was mildly to moderately dilated. There is mild concentric left ventricular hypertrophy. Left ventricular diastolic parameters are consistent with Grade I diastolic dysfunction (impaired relaxation). 2. Right ventricular systolic function is normal. The right ventricular size is normal. 3. Left atrial size was moderately dilated. 4. The mitral valve is degenerative. Mild mitral valve regurgitation. No evidence of mitral stenosis. 5. The aortic valve is tricuspid. There is mild calcification of the aortic valve. Aortic valve regurgitation is not visualized. Aortic valve sclerosis/calcification is present, without any evidence of aortic stenosis.  FINDINGS Left Ventricle: Left ventricular ejection fraction, by estimation, is 45 to 50%. The left ventricle has mildly decreased function. The left ventricle has no regional wall motion abnormalities. The left ventricular internal cavity size was mildly to moderately dilated. There is mild concentric left ventricular hypertrophy. Left ventricular diastolic parameters are consistent with Grade I diastolic dysfunction (impaired relaxation).  Right Ventricle: The right ventricular size is normal. No increase in right ventricular wall thickness. Right ventricular systolic function is normal.  Left Atrium: Left atrial size was moderately dilated.  Right Atrium: Right atrial size was normal in  size.  Pericardium: There is no evidence of pericardial effusion.  Mitral Valve: The mitral valve is degenerative in appearance. Mild mitral annular calcification. Mild mitral valve regurgitation. No evidence of mitral valve stenosis.  Tricuspid Valve: The tricuspid valve is normal in structure. Tricuspid valve regurgitation is trivial. No evidence of tricuspid stenosis.  Aortic Valve: The aortic valve is tricuspid. There is mild calcification of the aortic valve. Aortic valve regurgitation is not visualized. Aortic valve sclerosis/calcification is present, without any evidence of aortic stenosis. Aortic valve mean gradient measures 4.0 mmHg. Aortic valve peak gradient measures 6.4 mmHg. Aortic valve area, by VTI measures 2.14 cm.  Pulmonic Valve: The pulmonic valve was normal in structure. Pulmonic valve regurgitation is trivial. No evidence of pulmonic stenosis.  Aorta: The aortic root is normal in size and structure.  Venous: The inferior vena cava was not well visualized.  IAS/Shunts: No atrial level shunt detected by color flow Doppler.   LEFT VENTRICLE PLAX 2D LVIDd:         5.60 cm   Diastology LVIDs:         4.20 cm   LV e' medial:    2.94 cm/s LV PW:         1.00 cm   LV E/e' medial:  20.7 LV IVS:        0.90 cm   LV e' lateral:   4.57 cm/s LVOT diam:     1.90 cm   LV E/e' lateral: 13.3 LV SV:         60 LV SV Index:   37 LVOT Area:     2.84 cm   RIGHT VENTRICLE RV S prime:     21.00 cm/s TAPSE (M-mode): 2.2 cm  LEFT ATRIUM             Index        RIGHT ATRIUM          Index LA Vol (A2C):   60.5 ml 37.53 ml/m  RA Area:     9.27 cm LA Vol (A4C):   48.1 ml  29.84 ml/m  RA Volume:   17.10 ml 10.61 ml/m LA Biplane Vol: 55.9 ml 34.67 ml/m AORTIC VALVE AV Area (Vmax):    1.99 cm AV Area (Vmean):   1.95 cm AV Area (VTI):     2.14 cm AV Vmax:           126.00 cm/s AV Vmean:          93.200 cm/s AV VTI:            0.280 m AV Peak Grad:      6.4 mmHg AV Mean  Grad:      4.0 mmHg LVOT Vmax:         88.40 cm/s LVOT Vmean:        64.200 cm/s LVOT VTI:          0.211 m LVOT/AV VTI ratio: 0.75  AORTA Ao Root diam: 2.20 cm  MITRAL VALVE MV Area (PHT): 4.52 cm     SHUNTS MV Decel Time: 168 msec     Systemic VTI:  0.21 m MV E velocity: 60.80 cm/s   Systemic Diam: 1.90 cm MV A velocity: 111.00 cm/s MV E/A ratio:  0.55  Jules Oar MD Electronically signed by Jules Oar MD Signature Date/Time: 12/08/2023/10:46:06 AM    Final (Updated)    MONITORS  LONG TERM MONITOR (3-14 DAYS) 07/13/2023  Narrative Patch Wear Time:  7 days and 21 hours (2024-10-25T12:46:10-398 to 2024-11-02T09:55:35-0400)  Patient had a min HR of 46 bpm, max HR of 184 bpm, and avg HR of 73 bpm. Predominant underlying rhythm was Sinus Rhythm.  13 Ventricular Tachycardia runs occurred, the run with the fastest interval lasting 5 beats with a max rate of 184 bpm, the longest lasting 6 beats with an avg rate of 111 bpm.  9 Supraventricular Tachycardia runs occurred, the run with the fastest interval lasting 5 beats with a max rate of 174 bpm, the longest lasting 9 beats with an avg rate of 125 bpm. Isolated SVEs were rare (<1.0%), SVE Couplets were rare (<1.0%), and SVE Triplets were rare (<1.0%). Isolated VEs were frequent (21.5%, [id number]), VE Couplets were occasional (1.6%, 6942), and VE Triplets were rare (<1.0%, 1704). Ventricular Bigeminy and Trigeminy were present.  Symptoms associated with premature ventricular complexes.  Conclusion; The study showed evidence of symptomatic nonsustained ventricular tachycardia and symptomatic frequent premature atrial complexes.   CT SCANS  CT CORONARY MORPH W/CTA COR W/SCORE 10/02/2020  Addendum 10/02/2020  9:53 PM ADDENDUM REPORT: 10/02/2020 21:51  CLINICAL DATA:  This is a 78 year old female with chest pain and shortness of breath.  EXAM: Cardiac/Coronary  CT  TECHNIQUE: The patient was scanned on a [employer].  FINDINGS: A 120 kV retrospective scan was triggered in the descending thoracic aorta at 111 HU's. Axial non-contrast 3 mm slices were carried out through the heart. The data set was analyzed on a dedicated work station and scored using the Agatson method. Gantry rotation speed was 250 msecs and collimation was .6 mm. No beta blockade and 0.8 mg of sl NTG was given. The 3D data set was reconstructed in 5% intervals of the 67-82 % of the R-R cycle. Diastolic phases were analyzed on a dedicated work station using MPR, MIP and VRT modes. The patient received 80 cc of contrast.  Aorta: Normal size. Mild proximal ascending and descending aorta calcifications. No dissection.  Aortic Valve:  Trileaflet.  No calcifications.  Coronary calcium  score - 20.4  Coronary Arteries:  Normal coronary  origin.  Right dominance.  RCA is a large dominant artery that gives rise to PDA and PLVB. There is no plaque.  Left main is a large artery that gives rise to LAD and LCX arteries.  LAD is a large vessel. There is minimal (<24%) CAD. There is no plaques in the mid or distal LAD.  LCX is a non-dominant artery that gives rise to one large OM1 branch. There is no plaque.  Other findings:  Normal pulmonary vein drainage into the left atrium.  Normal left atrial appendage without a thrombus.  Normal size of the pulmonary artery.  Mild Mitral annular calcification.  IMPRESSION: 1. Coronary calcium  score of 20.4. This was 86 percentile for age and sex matched control.  2. Normal coronary origin with right dominance.  3. Minimal CAD. CADRADS 1. Aggressive medical therapy is recommended.  4. Mild mitral annular calcification.  5. Aortic Atherosclerosis.  Kardie Tobb, DO   Electronically Signed By: Kardie  Tobb DO On: 10/02/2020 21:51  Narrative EXAM: OVER-READ INTERPRETATION  CT CHEST  The following report is an over-read performed by radiologist Dr. Alexandria Angel of [facility] on 10/02/2020. This over-read does not include interpretation of cardiac or coronary anatomy or pathology. The coronary calcium  score/coronary CTA interpretation by the cardiologist is attached.  COMPARISON:  None.  FINDINGS: Aortic atherosclerosis. Within the visualized portions of the thorax there are no suspicious appearing pulmonary nodules or masses, there is no acute consolidative airspace disease, no pleural effusions, no pneumothorax and no lymphadenopathy. Visualized portions of the upper abdomen are unremarkable. There are no aggressive appearing lytic or blastic lesions noted in the visualized portions of the skeleton.  IMPRESSION: 1.  Aortic Atherosclerosis (ICD10-I70.0).  Electronically Signed: By: Alexandria Angel M.D. On: 10/02/2020 09:43     ______________________________________________________________________________________________     Recent Labs: 09/27/2023: TSH 1.97 12/07/2023: B Natriuretic Peptide 202.5 12/08/2023: Hemoglobin 12.2; Platelets 219 12/20/2023: Magnesium  1.8; NT-Pro BNP 524 01/02/2024: ALT 25; BUN 32; Creatinine, Ser 1.13; Potassium 4.9; Sodium 135  Recent Lipid Panel    Component Value Date/Time   CHOL 136 10/24/2023 0843   TRIG 61 10/24/2023 0843   HDL 71 10/24/2023 0843   CHOLHDL 1.9 10/24/2023 0843   LDLCALC 52 10/24/2023 0843    History of Present Illness    78 year old female with the above past medical history including CAD, chronic HRmrEF, paroxysmal atrial fibrillation, hypertension, orthostatic hypotension, hyperlipidemia, history of TIA, type 2 diabetes, hypothyroidism, CKD stage III, and protein S deficiency.  She was initially evaluated by cardiology in 2012 in the setting of PVCs, palpitations.  Event monitor per PCP showed PVCs, NSVT.  Nuclear stress test in 2012 was low risk.  Echocardiogram at the time showed normal LV function, G2 DD, no significant valvular abnormalities.  She was seen  again in 2014 in the setting of increased palpitations.  Repeat echocardiogram showed EF 30 to 35%, diffuse hypokinesis, G1 DD, mild MR.  She underwent LHC which showed normal coronary arteries, low normal EF.  She established with Dr. Emmette Harms in 2021 in the setting of orthostatic hypotension, shortness of breath, palpitations.  Echocardiogram in 12/2019 showed EF 65%, no RWMA, moderate LVH, G1 DD, normal RV systolic function, no significant valvular abnormalities.  Cardiac monitor in 09/2020 showed rare SVT, likely atrial tachycardia with variable block.  Coronary CTA in 09/2020 showed coronary calcium  score 20.4 (79 percentile), minimal CAD.  ABIs in 2023 in the setting of bilateral leg cramping were normal.  Echocardiogram in  04/2023 in the setting of increased palpitations, chest pain showed EF 60 to 65%, no RWMA, moderate asymmetric LVH, G1 DD, normal RV systolic function, no significant valvular abnormalities.  Cardiac PET stress test in 05/2023 showed normal perfusion images, no high risk findings.  Cardiac monitor in 07/2023 showed symptomatic NSVT, symptomatic frequent PACs.  Repeat cardiac catheterization in 12/2023 in the setting of increased shortness of breath on exertion, chest discomfort showed no angiographic evidence of CAD, LVEDP was elevated.  Thought to be in the setting of uncontrolled hypertension, renal insufficiency, hyponatremia.  She was admitted and underwent IV diuresis.  Echocardiogram 12/08/2023 showed EF 45 to 50%, no RWMA, mild LVH, G1 DD, normal RV systolic function, mild MR.  She was last seen in the office on 12/14/2023 for hospital follow-up and was doing well.  She did note some symptoms of orthostatic hypotension.  Recent labs per PCP showed hyperkalemia, hyponatremia.  Repeat BMP showed stable kidney function, normal electrolytes.  She was seen in the ED on 01/17/2024 following a syncopal episode.  She reported "blacking out."  She felt her symptoms were due to hypotension.  She reported  injuring her left foot and ankle.  I was negative for acute fracture.  She was discharged home in stable condition.  She presents today for follow-up accompanied by her husband .  Since her ED visit  she has been stable from a cardiac standpoint. She reports that she did not lose consciousness, but rather, "blacked out."  She reports that she was putting her dog in the car, she became diaphoretic, everything went dark and she somehow lowered herself to the ground. She denies any further presyncope or syncope.  Denies chest pain, dyspnea, edema, PND, orthopnea, weight gain.  She does note nightly palpitations, she sleeps with her head elevated and her symptoms improve.  Lastly, she is concerned that she is developing UTI symptoms.  She notes urinary frequency, urgency, and dysuria.  Home Medications    Current Outpatient Medications  Medication Sig Dispense Refill   acetaminophen  (TYLENOL ) 650 MG CR tablet Take 650-1,300 mg by mouth every 8 (eight) hours as needed for pain.     albuterol  (VENTOLIN  HFA) 108 (90 Base) MCG/ACT inhaler INHALE 1 TO 2 PUFFS INTO THE LUNGS EVERY 6 HOURS AS NEEDED FOR WHEEZING OR SHORTNESS OF BREATH 6.7 g 3   aspirin  EC (ASPIRIN  LOW DOSE) 81 MG tablet Take 1 tablet (81 mg total) by mouth daily. Swallow whole. 90 tablet 3   atorvastatin  (LIPITOR ) 80 MG tablet TAKE 1 TABLET(80 MG) BY MOUTH DAILY 90 tablet 3   carboxymethylcellulose (REFRESH PLUS) 0.5 % SOLN Place 1 drop into both eyes 3 (three) times daily as needed (dry eyes).     carvedilol  (COREG ) 12.5 MG tablet Take 1 tablet (12.5 mg total) by mouth 2 (two) times daily with a meal. 60 tablet 3   Cholecalciferol (VITAMIN D3) 50 MCG (2000 UT) TABS Take 4,000 Units by mouth in the morning and at bedtime.     cyanocobalamin  (VITAMIN B12) 1000 MCG tablet Take 1,000 mcg by mouth daily with lunch.     dapagliflozin  propanediol (FARXIGA ) 10 MG TABS tablet Take 1 tablet (10 mg total) by mouth daily before breakfast. 90 tablet 3    Dulaglutide  (TRULICITY ) 0.75 MG/0.5ML SOAJ Inject 0.75 mg into the skin once a week. 6 mL 3   ELIQUIS  5 MG TABS tablet TAKE 1 TABLET(5 MG) BY MOUTH TWICE DAILY 60 tablet 2   estradiol (ESTRACE) 0.1 MG/GM  vaginal cream Place 1 Applicatorful vaginally 2 (two) times a week. As needed     ferrous sulfate  325 (65 FE) MG tablet Take 325 mg by mouth daily with lunch.     furosemide  (LASIX ) 20 MG tablet Take 1 tablet (20 mg total) by mouth daily. 90 tablet 0   GEMTESA  75 MG TABS Take 1 tablet (75 mg total) by mouth at bedtime. 90 tablet 3   insulin  aspart (NOVOLOG  FLEXPEN) 100 UNIT/ML FlexPen Max daily 30 units (Patient taking differently: Inject 6 Units into the skin 3 (three) times daily with meals. Sliding scale Max daily 30 units) 30 mL 3   Insulin  Pen Needle (BD PEN NEEDLE NANO 2ND GEN) 32G X 4 MM MISC 1 Device by Other route in the morning, at noon, in the evening, and at bedtime. USE TO INJECT INSULIN  four times DAILY (Patient taking differently: 1 Device by Other route 3 (three) times daily. USE TO INJECT INSULIN  four times DAILY) 400 each 3   isosorbide  mononitrate (IMDUR ) 30 MG 24 hr tablet Take 1 tablet (30 mg total) by mouth daily. 90 tablet 3   lisinopril  (ZESTRIL ) 20 MG tablet TAKE 1 TABLETS BY MOUTH TWICE DAILY 180 tablet 3   magnesium  oxide (MAG-OX) 400 (240 Mg) MG tablet TAKE 2 TABLETS(800 MG) BY MOUTH TWICE DAILY (Patient taking differently: Take 800 mg by mouth 2 (two) times daily.) 120 tablet 2   nystatin cream (MYCOSTATIN) Apply 1 Application topically 4 (four) times daily as needed for dry skin (yeast infection).     ondansetron  (ZOFRAN -ODT) 8 MG disintegrating tablet Take 8 mg by mouth every 8 (eight) hours as needed for nausea or vomiting.      pantoprazole  (PROTONIX ) 40 MG tablet TAKE 1 TABLET(40 MG) BY MOUTH EVERY MORNING 90 tablet 1   pyridoxine (B-6) 100 MG tablet Take 100 mg by mouth daily with lunch.     sennosides-docusate sodium  (SENOKOT-S) 8.6-50 MG tablet Take 1 tablet by  mouth daily as needed for constipation.     sodium chloride  (OCEAN) 0.65 % SOLN nasal spray Place 1 spray into both nostrils as needed for congestion.     zolpidem  (AMBIEN ) 5 MG tablet TAKE 1 TABLET(5 MG) BY MOUTH AT BEDTIME AS NEEDED FOR SLEEP (Patient taking differently: Take 5 mg by mouth at bedtime.) 30 tablet 5   Continuous Glucose Sensor (FREESTYLE LIBRE 2 SENSOR) MISC 2 each by Does not apply route every 14 (fourteen) days. E11.40 6 each 3   Multiple Vitamins-Minerals (PRESERVISION AREDS PO) Take 1 capsule by mouth 2 (two) times daily. (Patient not taking: Reported on 01/18/2024)     No current facility-administered medications for this visit.     Review of Systems    She denies chest pain, dyspnea, pnd, orthopnea, n, v, dizziness, syncope, edema, weight gain, or early satiety. All other systems reviewed and are otherwise negative except as noted above.   Physical Exam    VS:  BP 131/70 (BP Location: Right Arm, Patient Position: Standing, Cuff Size: Normal)   Pulse 72   Ht 5' (1.524 m)   Wt 148 lb (67.1 kg)   SpO2 98%   BMI 28.90 kg/m   GEN: Well nourished, well developed, in no acute distress. HEENT: normal. Neck: Supple, no JVD, carotid bruits, or masses. Cardiac: RRR, no murmurs, rubs, or gallops. No clubbing, cyanosis, edema.  Radials/DP/PT 2+ and equal bilaterally.  Respiratory:  Respirations regular and unlabored, clear to auscultation bilaterally. GI: Soft, nontender, nondistended, BS + x  4. MS: no deformity or atrophy. Skin: warm and dry, no rash. Neuro:  Strength and sensation are intact. Psych: Normal affect.  Accessory Clinical Findings    ECG personally reviewed by me today - EKG Interpretation Date/Time:  Thursday Jan 18 2024 13:48:18 EDT Ventricular Rate:  65 PR Interval:  180 QRS Duration:  96 QT Interval:  396 QTC Calculation: 411 R Axis:   -8  Text Interpretation: Normal sinus rhythm Minimal voltage criteria for LVH, may be normal variant ( [employer]  product ) When compared with ECG of 07-Dec-2023 08:21, Premature ventricular complexes are no longer Present Confirmed by Marlana Silvan ([id number]) on 01/18/2024 1:50:30 PM  - no acute changes.   Lab Results  Component Value Date   WBC 6.7 12/08/2023   HGB 12.2 12/08/2023   HCT 35.8 (L) 12/08/2023   MCV 90.9 12/08/2023   PLT 219 12/08/2023   Lab Results  Component Value Date   CREATININE 1.13 (H) 01/02/2024   BUN 32 (H) 01/02/2024   NA 135 01/02/2024   K 4.9 01/02/2024   CL 100 01/02/2024   CO2 20 01/02/2024   Lab Results  Component Value Date   ALT 25 01/02/2024   AST 25 01/02/2024   ALKPHOS 99 01/02/2024   BILITOT 0.4 01/02/2024   Lab Results  Component Value Date   CHOL 136 10/24/2023   HDL 71 10/24/2023   LDLCALC 52 10/24/2023   TRIG 61 10/24/2023   CHOLHDL 1.9 10/24/2023    Lab Results  Component Value Date   HGBA1C 7.1 (H) 10/24/2023    Assessment & Plan   1. Hypertension/orthostatic hypotension/near-syncope/NSVT:  Cardiac monitor in 07/2023 showed symptomatic NSVT, symptomatic frequent PACs.  Recent episode of near syncope.  Prior to her episode she became diaphoretic.  She states "everything went dark." She was able to lower herself to the ground.  She did not lose consciousness.  Orthostatic vital signs positive in office today.  She is fortunately asymptomatic.  EKG today shows normal sinus rhythm.  Recent echo, LHC reassuring.  Denies chest pain, dyspnea, euvolemic and well compensated on exam.  She does report nightly palpitations.  Given near syncope, history of NSVT, will check 30-day event monitor.  Will also check carotid ultrasound. Encouraged adequate hydration, nodule position changes, thigh sleeves, abdominal compression. Will change Lasix  to 20 mg daily as needed for swelling, weight gain. If BP remains low, consider decreasing lisinopril /Imdur .  Continue to monitor BP report SBP considered less than 110, greater than 150 mmHg.  Reviewed ED precautions.   Otherwise, for now, continue current antihypertensive regimen.  2. Mild nonobstructive CAD: Coronary CTA in 09/2020 showed coronary calcium  score 20.4 (79 percentile), minimal CAD.  Cardiac PET stress test in 05/2023 showed normal perfusion images, no high risk findings.  Cath in 12/2023 in the setting of increased shortness of breath on exertion, chest discomfort showed no angiographic evidence of CAD, LVEDP was elevated.  Stable with no anginal symptoms.  Continue aspirin , carvedilol , lisinopril , Imdur , and Lipitor .   3. Dyspnea on exertion/NICM/chronic HFmrEF: Echocardiogram 12/08/2023 showed EF 45 to 50%, no RWMA, mild LVH, G1 DD, normal RV systolic function, mild MR. Euvolemic and well compensated on exam.  She denies any recent dyspnea.  Will change Lasix  to as needed as above in the setting of severe orthostatic hypotension.  We reviewed daily weights, fluid recommendations.  She has a history of frequent UTIs.  She currently reports symptoms concerning for UTI.  Recommend she follow-up with PCP, consider discontinuing  Farxiga .  Will defer for now as she states it was recommended she continue this per PCP and nephrology.  Continue carvedilol , lisinopril , Farxiga , Lasix  as above.  4. Paroxysmal atrial fibrillation: Not well-documented.  No evidence of A-fib on prior cardiac monitors.  On Eliquis  for protein S deficiency.  Today event monitor pending as above.  5. Hyperlipidemia: LDL was 52 in 10/2023.  Continue Lipitor .  6. CKD stage IIIa: Creatinine was stable at 1.13 in 12/2023.  Following with nephrology.  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   8. Disposition: Follow-up as scheduled with Dr. Emmette Harms in 03/2024.      Jude Norton, NP 01/18/2024, 2:23 PM

## 2024-01-18 NOTE — Progress Notes (Unsigned)
 [employer] event monitor serial # [id number] from office inventory applied to patient.  Dr. Carolynne Citron to read.

## 2024-01-20 ENCOUNTER — Encounter: Payer: Self-pay | Admitting: Nurse Practitioner

## 2024-01-21 LAB — URINE CULTURE: Culture: 20000 — AB

## 2024-01-22 ENCOUNTER — Encounter: Payer: Self-pay | Admitting: Family Medicine

## 2024-01-22 ENCOUNTER — Other Ambulatory Visit: Payer: Self-pay

## 2024-01-22 ENCOUNTER — Ambulatory Visit (INDEPENDENT_AMBULATORY_CARE_PROVIDER_SITE_OTHER): Admitting: Family Medicine

## 2024-01-22 ENCOUNTER — Ambulatory Visit: Payer: Self-pay | Admitting: *Deleted

## 2024-01-22 ENCOUNTER — Emergency Department (HOSPITAL_COMMUNITY)

## 2024-01-22 ENCOUNTER — Observation Stay (HOSPITAL_COMMUNITY)
Admission: EM | Admit: 2024-01-22 | Discharge: 2024-01-26 | Disposition: A | Discharge status: Home / Self Care | Attending: Internal Medicine | Admitting: Internal Medicine

## 2024-01-22 ENCOUNTER — Telehealth: Payer: Self-pay

## 2024-01-22 ENCOUNTER — Other Ambulatory Visit: Payer: Self-pay | Admitting: Family Medicine

## 2024-01-22 ENCOUNTER — Ambulatory Visit (HOSPITAL_COMMUNITY): Payer: Self-pay

## 2024-01-22 VITALS — BP 89/40 | HR 59 | Temp 97.9°F | Resp 16 | Ht 60.0 in | Wt 138.4 lb

## 2024-01-22 DIAGNOSIS — I130 Hypertensive heart and chronic kidney disease with heart failure and stage 1 through stage 4 chronic kidney disease, or unspecified chronic kidney disease: Secondary | ICD-10-CM | POA: Insufficient documentation

## 2024-01-22 DIAGNOSIS — E1122 Type 2 diabetes mellitus with diabetic chronic kidney disease: Secondary | ICD-10-CM | POA: Diagnosis not present

## 2024-01-22 DIAGNOSIS — N390 Urinary tract infection, site not specified: Secondary | ICD-10-CM | POA: Insufficient documentation

## 2024-01-22 DIAGNOSIS — Z7985 Long-term (current) use of injectable non-insulin antidiabetic drugs: Secondary | ICD-10-CM | POA: Diagnosis not present

## 2024-01-22 DIAGNOSIS — Z86718 Personal history of other venous thrombosis and embolism: Secondary | ICD-10-CM | POA: Insufficient documentation

## 2024-01-22 DIAGNOSIS — R079 Chest pain, unspecified: Secondary | ICD-10-CM

## 2024-01-22 DIAGNOSIS — E1140 Type 2 diabetes mellitus with diabetic neuropathy, unspecified: Secondary | ICD-10-CM | POA: Diagnosis not present

## 2024-01-22 DIAGNOSIS — R55 Syncope and collapse: Principal | ICD-10-CM | POA: Diagnosis present

## 2024-01-22 DIAGNOSIS — I5042 Chronic combined systolic (congestive) and diastolic (congestive) heart failure: Secondary | ICD-10-CM | POA: Insufficient documentation

## 2024-01-22 DIAGNOSIS — J45909 Unspecified asthma, uncomplicated: Secondary | ICD-10-CM | POA: Insufficient documentation

## 2024-01-22 DIAGNOSIS — I5020 Unspecified systolic (congestive) heart failure: Secondary | ICD-10-CM

## 2024-01-22 DIAGNOSIS — I480 Paroxysmal atrial fibrillation: Secondary | ICD-10-CM | POA: Diagnosis not present

## 2024-01-22 DIAGNOSIS — R109 Unspecified abdominal pain: Secondary | ICD-10-CM | POA: Insufficient documentation

## 2024-01-22 DIAGNOSIS — I5021 Acute systolic (congestive) heart failure: Secondary | ICD-10-CM

## 2024-01-22 DIAGNOSIS — E119 Type 2 diabetes mellitus without complications: Secondary | ICD-10-CM

## 2024-01-22 DIAGNOSIS — E785 Hyperlipidemia, unspecified: Secondary | ICD-10-CM | POA: Diagnosis not present

## 2024-01-22 DIAGNOSIS — Z7901 Long term (current) use of anticoagulants: Secondary | ICD-10-CM | POA: Insufficient documentation

## 2024-01-22 DIAGNOSIS — M7732 Calcaneal spur, left foot: Secondary | ICD-10-CM | POA: Diagnosis not present

## 2024-01-22 DIAGNOSIS — Z96652 Presence of left artificial knee joint: Secondary | ICD-10-CM | POA: Insufficient documentation

## 2024-01-22 DIAGNOSIS — N1832 Chronic kidney disease, stage 3b: Secondary | ICD-10-CM | POA: Diagnosis not present

## 2024-01-22 DIAGNOSIS — B952 Enterococcus as the cause of diseases classified elsewhere: Secondary | ICD-10-CM | POA: Insufficient documentation

## 2024-01-22 DIAGNOSIS — Z7982 Long term (current) use of aspirin: Secondary | ICD-10-CM | POA: Diagnosis not present

## 2024-01-22 DIAGNOSIS — R42 Dizziness and giddiness: Secondary | ICD-10-CM | POA: Diagnosis not present

## 2024-01-22 DIAGNOSIS — D6859 Other primary thrombophilia: Secondary | ICD-10-CM | POA: Diagnosis present

## 2024-01-22 DIAGNOSIS — M129 Arthropathy, unspecified: Secondary | ICD-10-CM | POA: Diagnosis not present

## 2024-01-22 DIAGNOSIS — E039 Hypothyroidism, unspecified: Secondary | ICD-10-CM | POA: Insufficient documentation

## 2024-01-22 DIAGNOSIS — R531 Weakness: Secondary | ICD-10-CM | POA: Diagnosis not present

## 2024-01-22 DIAGNOSIS — I428 Other cardiomyopathies: Secondary | ICD-10-CM | POA: Diagnosis not present

## 2024-01-22 DIAGNOSIS — Z7984 Long term (current) use of oral hypoglycemic drugs: Secondary | ICD-10-CM | POA: Diagnosis not present

## 2024-01-22 DIAGNOSIS — Z794 Long term (current) use of insulin: Secondary | ICD-10-CM

## 2024-01-22 DIAGNOSIS — I10 Essential (primary) hypertension: Secondary | ICD-10-CM | POA: Diagnosis not present

## 2024-01-22 DIAGNOSIS — I959 Hypotension, unspecified: Secondary | ICD-10-CM | POA: Diagnosis not present

## 2024-01-22 DIAGNOSIS — I5030 Unspecified diastolic (congestive) heart failure: Secondary | ICD-10-CM | POA: Diagnosis present

## 2024-01-22 DIAGNOSIS — Z79899 Other long term (current) drug therapy: Secondary | ICD-10-CM | POA: Insufficient documentation

## 2024-01-22 DIAGNOSIS — N3000 Acute cystitis without hematuria: Secondary | ICD-10-CM

## 2024-01-22 DIAGNOSIS — R252 Cramp and spasm: Secondary | ICD-10-CM

## 2024-01-22 DIAGNOSIS — M25572 Pain in left ankle and joints of left foot: Secondary | ICD-10-CM | POA: Diagnosis not present

## 2024-01-22 HISTORY — DX: Urinary tract infection, site not specified: N39.0

## 2024-01-22 HISTORY — DX: Chest pain, unspecified: R07.9

## 2024-01-22 HISTORY — DX: Syncope and collapse: R55

## 2024-01-22 LAB — CBC WITH DIFFERENTIAL/PLATELET
Abs Immature Granulocytes: 0.04 10*3/uL (ref 0.00–0.07)
Basophils Absolute: 0 10*3/uL (ref 0.0–0.1)
Basophils Relative: 0 %
Eosinophils Absolute: 0.1 10*3/uL (ref 0.0–0.5)
Eosinophils Relative: 1 %
HCT: 36.3 % (ref 36.0–46.0)
Hemoglobin: 12.2 g/dL (ref 12.0–15.0)
Immature Granulocytes: 1 %
Lymphocytes Relative: 11 %
Lymphs Abs: 0.9 10*3/uL (ref 0.7–4.0)
MCH: 31.6 pg (ref 26.0–34.0)
MCHC: 33.6 g/dL (ref 30.0–36.0)
MCV: 94 fL (ref 80.0–100.0)
Monocytes Absolute: 0.9 10*3/uL (ref 0.1–1.0)
Monocytes Relative: 11 %
Neutro Abs: 6.3 10*3/uL (ref 1.7–7.7)
Neutrophils Relative %: 76 %
Platelets: 292 10*3/uL (ref 150–400)
RBC: 3.86 MIL/uL — ABNORMAL LOW (ref 3.87–5.11)
RDW: 14.3 % (ref 11.5–15.5)
WBC: 8.3 10*3/uL (ref 4.0–10.5)
nRBC: 0 % (ref 0.0–0.2)

## 2024-01-22 LAB — URINALYSIS, W/ REFLEX TO CULTURE (INFECTION SUSPECTED)
Bilirubin Urine: NEGATIVE
Glucose, UA: >=500 mg/dL — AB
Hgb urine dipstick: NEGATIVE
Ketones, ur: NEGATIVE mg/dL
Nitrite: NEGATIVE
Protein, ur: NEGATIVE mg/dL
Specific Gravity, Urine: 1.01 (ref 1.005–1.030)
WBC, UA: >50 WBC/hpf (ref 0–5)
pH: 6 (ref 5.0–8.0)

## 2024-01-22 LAB — COMPREHENSIVE METABOLIC PANEL WITH GFR
ALT: 25 U/L (ref 0–44)
AST: 29 U/L (ref 15–41)
Albumin: 3.5 g/dL (ref 3.5–5.0)
Alkaline Phosphatase: 80 U/L (ref 38–126)
Anion gap: 10 (ref 5–15)
BUN: 33 mg/dL — ABNORMAL HIGH (ref 8–23)
CO2: 27 mmol/L (ref 22–32)
Calcium: 9 mg/dL (ref 8.9–10.3)
Chloride: 99 mmol/L (ref 98–111)
Creatinine, Ser: 1.24 mg/dL — ABNORMAL HIGH (ref 0.44–1.00)
GFR, Estimated: 44 mL/min — ABNORMAL LOW (ref >60)
Glucose, Bld: 170 mg/dL — ABNORMAL HIGH (ref 70–99)
Potassium: 4.4 mmol/L (ref 3.5–5.1)
Sodium: 136 mmol/L (ref 135–145)
Total Bilirubin: 1 mg/dL (ref 0.0–1.2)
Total Protein: 6.4 g/dL — ABNORMAL LOW (ref 6.5–8.1)

## 2024-01-22 LAB — TROPONIN I (HIGH SENSITIVITY)
Troponin I (High Sensitivity): 12 ng/L (ref <18)
Troponin I (High Sensitivity): 10 ng/L (ref <18)

## 2024-01-22 LAB — MAGNESIUM: Magnesium: 1.8 mg/dL (ref 1.7–2.4)

## 2024-01-22 MED ORDER — LACTATED RINGERS IV BOLUS
500.0000 mL | Freq: Once | INTRAVENOUS | Status: AC
  Administered 2024-01-22: 500 mL via INTRAVENOUS

## 2024-01-22 MED ORDER — SODIUM CHLORIDE 0.9 % IV BOLUS
250.0000 mL | Freq: Once | INTRAVENOUS | Status: AC
  Administered 2024-01-23: 250 mL via INTRAVENOUS

## 2024-01-22 MED ORDER — AMOXICILLIN-POT CLAVULANATE 875-125 MG PO TABS
1.0000 | ORAL_TABLET | Freq: Two times a day (BID) | ORAL | Status: DC
  Administered 2024-01-22 – 2024-01-26 (×8): 1 via ORAL
  Filled 2024-01-22 (×10): qty 1

## 2024-01-22 MED ORDER — CEPHALEXIN 500 MG PO CAPS: 500.0000 mg | ORAL_CAPSULE | Freq: Two times a day (BID) | ORAL | 0 refills | Status: DC

## 2024-01-22 NOTE — Assessment & Plan Note (Addendum)
 Hypotension with dizziness, lightheadedness, near syncope, cold sweats, nausea, and altered mental status. Blood pressure 89/40 mmHg. Possible orthostatic hypotension. Recent cardiology evaluation suggested holding some antihypertensive medications. - Call EMS for transport to the hospital due to altered mental status and hypotension. - Consider holding or adjusting antihypertensive medications, specifically lisinopril  or Imdur , if symptoms persist.

## 2024-01-22 NOTE — Assessment & Plan Note (Signed)
 Diabetes mellitus with blood sugar at 99 mg/dL. No hypoglycemia noted during the episode of altered mental status.

## 2024-01-22 NOTE — H&P (Signed)
 History and Physical    Pamela [name] WGN:[id number] DOB: [birth year] DOA: 01/22/2024  Patient coming from: Home.  Chief Complaint: Loss of consciousness (  HPI: Pamela [name] is a 78 y.o. 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  Patient had come to the ER on 01/17/2024 due to syncopal episode [place].  At that time patient had brief episode of diaphoresis then she passed out.  Patient had come to the ER and followed up with cardiology office was instructed to hold off on Lasix  and Farxiga  and patient was placed on a cardiac monitor.  Patient has continued to take Lasix  and Farxiga  since she wanted to get an opinion from her primary care physician which she had gone today to visit and at the office patient had another episode of syncope.  Patient's blood pressure was found to be in the low normal.  Patient was given fluid bolus and brought to the ER.  During her syncopal episode last week she did hurt her left ankle but x-rays do not show any fracture.  She is wearing a boot.  Patient also has been having dysuria and urine cultures done a week ago showing mixed flora with Enterococcus faecalis predominantly.  ED Course: In the ER patient was evaluated by cardiologist.  Review of Systems: As per HPI, rest all negative.   Past Medical History:  Diagnosis Date   Anticoagulated on Coumadin     chronic--- managed by hematology/ oncology,   Asthma, mild intermittent    followed by pcp--- per pt last exacerbation winter 2021 w/ acute bronchitis   Bilateral leg cramps    Blood dyscrasia    Chronic constipation    CKD (chronic kidney disease), stage III (HCC)     Closed bimalleolar fracture of left ankle 02/26/2020   DDD (degenerative disc disease), cervical    w/ spondylosis,  per pt last steroid injection 06/ 2022   DDD (degenerative disc disease), lumbosacral    DVT (deep venous thrombosis) (HCC) 07/30/2013   Dyspnea    occasionally   Dysrhythmia    GERD (gastroesophageal reflux disease)    History of cardiac murmur as a child    History of DVT of lower extremity    left lower extremity in 1980s, fell when bowling   History of pulmonary embolus (PE) 1993   per pt left lung post op 2 wks cholecystectomy   History of rheumatic fever as a child    per last echo 12-31-2019 no valvular issues   History of TIA (transient ischemic attack)    2014 and 2018 or 2019,  per pt no residual   HTN (hypertension)    followed by pcp   Hyperlipidemia    Hypothyroidism    IDA (iron deficiency anemia)    Macular degeneration of both eyes    Mild obstructive sleep apnea    per pt dx 2017 tried to uses cpap but intolerant   Mixed incontinence urge and stress    urologist--- dr Reginal Capra   OA (osteoarthritis) 07/06/2018   Osteoporosis    PAF (paroxysmal atrial fibrillation) (HCC) 10/01/2014   cardiologist--- dr Chyrl Crawford tobb;   cardiac cath 02-18-2013 normal coronaries arteries, ef 50%, cath done since echo showed ef 30-35%;  nucleat stress study 04/ 2020 normal , normal echo 04/ 2021,  event monitor 09-14-2020 rare ST/AT variable block   Pneumonia    PONV (postoperative nausea and vomiting)    Protein S deficiency (HCC)    followed by hemotology/ oncology-- dr d. Harles Lied ([facility]) dx 1980s;  prior DVT left lower leg 1980s and left lung PE 1993; chronic  coumadin  since 1980s   PVC's (premature ventricular contractions)    followed by cardiology   S/P cardiac catheterization 02/2013   Normal coronaries; low normal EF at 50%   Solitary pulmonary nodule on lung CT 02/06/2019   First noted 01/13/2014 > no change as of 12/21/2018    Spondylolisthesis, lumbar region 08/09/2018   Stroke (HCC)    TIA in 2018 or 2019   Transient ischemic attack 07/30/2013   Type 2 diabetes mellitus treated with insulin  (HCC)    endocrilogist--- whitney reardon NP     (03-10-2021  pt continuously checks blood sugar throughout the day w/ Libre, fasting sugar --- 69--200)   Wears glasses    Wears hearing aid in both ears     Past Surgical History:  Procedure Laterality Date   ABDOMINAL HYSTERECTOMY     BLEPHAROPLASTY     CARDIAC CATHETERIZATION  02/18/2013   @ [facility]  by Dr [place];  normal coronaries w/ preserved LVF, ef 50%;   previous cath 2001 normal ef 65%   CARPAL TUNNEL RELEASE Bilateral 1994   CATARACT EXTRACTION W/ INTRAOCULAR LENS IMPLANT Bilateral 2017   CHOLECYSTECTOMY     CHOLECYSTECTOMY, LAPAROSCOPIC  1993   COLONOSCOPY     EAR BIOPSY Left    FINGER SURGERY Left 2018   thumb   FOOT SURGERY     FOOT TENDON SURGERY Right    early 2000s   FRACTURE SURGERY Left 02/13/2020   left femur   HAND SURGERY Right 04/2022   HARDWARE REMOVAL Left 03/12/2021   Procedure: HARDWARE REMOVAL;  Surgeon: Janeth Medicus, MD;  Location: [facility];  Service: Orthopedics;  Laterality: Left;  60 MINS   JOINT REPLACEMENT     LEFT HEART CATH AND CORONARY ANGIOGRAPHY N/A 12/07/2023   Procedure: LEFT HEART CATH AND CORONARY ANGIOGRAPHY;  Surgeon: Odie Benne, MD;  Location: [facility] IN[facility];  Service: Cardiovascular;  Laterality: N/A;   LUMBAR LAMINECTOMY/DECOMPRESSION MICRODISCECTOMY N/A 12/22/2022   Procedure: Lumbar Two-Lumbar Three, Lumbar Three-Lumbar Four Lumbar Decompression;  Surgeon: Elna Haggis, MD;  Location: [facility] OR;  Service: Neurosurgery;  Laterality: N/A;  RM 20 3C   ORIF ANKLE FRACTURE Left 02/26/2020   Procedure: OPEN REDUCTION INTERNAL FIXATION (ORIF) ANKLE FRACTURE;  Surgeon: Janeth Medicus, MD;  Location: [facility] OR;  Service: Orthopedics;  Laterality: Left;  90 mins   TONSILLECTOMY AND  ADENOIDECTOMY Bilateral    TOTAL KNEE ARTHROPLASTY Left 03/2005   TOTAL VAGINAL HYSTERECTOMY  1988   per pt still has ovaries     reports that she has never smoked. She has never used smokeless tobacco. She reports that she does not drink alcohol and does not use drugs.  Allergies  Allergen Reactions   Ketek [Telithromycin] Nausea And Vomiting and Rash   Loxapine Succinate Hives   Macrodantin  [Nitrofurantoin  Macrocrystal] Other (See Comments)    Pulmonary fibrosis   Naproxen Sodium Anaphylaxis and Hives   Amoxapine And Related Hives   Darvon Nausea And Vomiting   Belsomra [Suvorexant] Other (See Comments)    "Nightmares"   Cymbalta [Duloxetine Hcl] Other (See  Comments)    "Blackouts"   Duloxetine    Gabapentin  Other (See Comments)    Blurry vision   Lyrica  [Pregabalin ]     Makes her sedated   Ozempic  (0.25 Or 0.5 Mg-Dose) [Semaglutide (0.25 Or 0.5mg -Dos)] Diarrhea   Semaglutide  Diarrhea    Rybelsus    Amoxicillin  Rash   Propoxyphene Nausea And Vomiting    Family History  Problem Relation Age of Onset   Cirrhosis Mother    Antithrombin III deficiency Mother        multiple emboli   Ulcerative colitis Mother    Diabetes Father    Coronary artery disease Father    Heart attack Father    Hypertension Father    Kidney disease Father    Hypertension Sister    Diabetes Sister    Heart attack Sister        age 26    Protein S deficiency Daughter    Heart attack Brother    Hypertension Brother    Lung cancer Brother    Stroke Neg Hx    Colitis Neg Hx    Colon polyps Neg Hx    Esophageal cancer Neg Hx    Liver cancer Neg Hx    Pancreatic cancer Neg Hx    Rectal cancer Neg Hx    Stomach cancer Neg Hx    Breast cancer Neg Hx     Prior to Admission medications   Medication Sig Start Date End Date Taking? Authorizing Provider  acetaminophen  (TYLENOL ) 650 MG CR tablet Take 650-1,300 mg by mouth every 8 (eight) hours as needed for pain.    [provider]   albuterol  (VENTOLIN  HFA) 108 (90 Base) MCG/ACT inhaler INHALE 1 TO 2 PUFFS INTO THE LUNGS EVERY 6 HOURS AS NEEDED FOR WHEEZING OR SHORTNESS OF BREATH 02/04/22   Cyndi Drain, PA-C  aspirin  EC (ASPIRIN  LOW DOSE) 81 MG tablet Take 1 tablet (81 mg total) by mouth daily. Swallow whole. 01/03/24   Tobb, Kardie, DO  atorvastatin  (LIPITOR ) 80 MG tablet TAKE 1 TABLET(80 MG) BY MOUTH DAILY 08/07/23   Tobb, Kardie, DO  carboxymethylcellulose (REFRESH PLUS) 0.5 % SOLN Place 1 drop into both eyes 3 (three) times daily as needed (dry eyes).    [provider]  carvedilol  (COREG ) 12.5 MG tablet Take 1 tablet (12.5 mg total) by mouth 2 (two) times daily with a meal. 12/20/23   Craft, Mirian Ames, PA  cephALEXin  (KEFLEX ) 500 MG capsule Take 1 capsule (500 mg total) by mouth 2 (two) times daily. 01/22/24   CoxBurleigh Carp, MD  Cholecalciferol (VITAMIN D3) 50 MCG (2000 UT) TABS Take 4,000 Units by mouth in the morning and at bedtime.    [provider]  cyanocobalamin  (VITAMIN B12) 1000 MCG tablet Take 1,000 mcg by mouth daily with lunch.    [provider]  dapagliflozin  propanediol (FARXIGA ) 10 MG TABS tablet Take 1 tablet (10 mg total) by mouth daily before breakfast. 10/05/23   Shamleffer, Ibtehal Jaralla, MD  Dulaglutide  (TRULICITY ) 0.75 MG/0.5ML SOAJ Inject 0.75 mg into the skin once a week. 10/05/23   Shamleffer, Ibtehal Jaralla, MD  ELIQUIS  5 MG TABS tablet TAKE 1 TABLET(5 MG) BY MOUTH TWICE DAILY 12/01/23   Mosher, Alejandra Amos A, PA-C  estradiol (ESTRACE) 0.1 MG/GM vaginal cream Place 1 Applicatorful vaginally 2 (two) times a week. As needed 06/10/20   [provider]  ferrous sulfate  325 (65 FE) MG tablet Take 325 mg by mouth daily with lunch.    [provider]  furosemide  (LASIX ) 20 MG tablet Take 1 tablet (20 mg total) by mouth daily. Patient taking differently: Take 20 mg by mouth daily as needed for fluid or edema. 01/04/24   CoxBurleigh Carp, MD  GEMTESA  75 MG TABS Take 1 tablet (75 mg  total) by mouth at bedtime. 12/20/23   Odilia Bennett, PA  insulin  aspart (NOVOLOG  FLEXPEN) 100 UNIT/ML FlexPen Max daily 30 units Patient taking differently: Inject 6 Units into the skin 3 (three) times daily with meals. Sliding scale Max daily 30 units 10/05/23   Shamleffer, Ibtehal Jaralla, MD  Insulin  Pen Needle (BD PEN NEEDLE NANO 2ND GEN) 32G X 4 MM MISC 1 Device by Other route in the morning, at noon, in the evening, and at bedtime. USE TO INJECT INSULIN  four times DAILY Patient taking differently: 1 Device by Other route 3 (three) times daily. USE TO INJECT INSULIN  four times DAILY 10/05/23   Shamleffer, Ibtehal Jaralla, MD  isosorbide  mononitrate (IMDUR ) 30 MG 24 hr tablet Take 1 tablet (30 mg total) by mouth daily. 12/14/23   Debria Fang, PA-C  lisinopril  (ZESTRIL ) 20 MG tablet TAKE 1 TABLETS BY MOUTH TWICE DAILY 07/17/23   Cox, Burleigh Carp, MD  magnesium  oxide (MAG-OX) 400 (240 Mg) MG tablet TAKE 2 TABLETS(800 MG) BY MOUTH TWICE DAILY Patient taking differently: Take 800 mg by mouth 2 (two) times daily. 02/15/21   Audie Bleacher, MD  Multiple Vitamins-Minerals (PRESERVISION AREDS PO) Take 1 capsule by mouth 2 (two) times daily.    [provider]  nystatin cream (MYCOSTATIN) Apply 1 Application topically 4 (four) times daily as needed for dry skin (yeast infection).    [provider]  ondansetron  (ZOFRAN -ODT) 8 MG disintegrating tablet Take 8 mg by mouth every 8 (eight) hours as needed for nausea or vomiting.     [provider]  pantoprazole  (PROTONIX ) 40 MG tablet TAKE 1 TABLET(40 MG) BY MOUTH EVERY MORNING 11/05/23   Cox, Kirsten, MD  pyridoxine (B-6) 100 MG tablet Take 100 mg by mouth daily with lunch.    [provider]  sennosides-docusate sodium  (SENOKOT-S) 8.6-50 MG tablet Take 1 tablet by mouth daily as needed for constipation.    [provider]  sodium chloride  (OCEAN) 0.65 % SOLN nasal spray Place 1 spray into both nostrils as  needed for congestion.    [provider]  zolpidem  (AMBIEN ) 5 MG tablet TAKE 1 TABLET(5 MG) BY MOUTH AT BEDTIME AS NEEDED FOR SLEEP Patient taking differently: Take 5 mg by mouth at bedtime. 10/14/23   [name], MD    Physical Exam: Constitutional: Moderately built and nourished. Vitals:   01/22/24 1957 01/22/24 2015 01/22/24 2030 01/22/24 2145  BP:   (!) 183/73 (!) 174/72  Pulse:  76 82 73  Resp:  17 18 20   Temp: 97.8 F (36.6 C)     TempSrc: Oral     SpO2:  98% 100% 100%   Eyes: Anicteric no pallor. ENMT: No discharge from the ears/nose or mouth. Neck: No mass felt.  No neck rigidity. Respiratory: No rhonchi or crepitations. Cardiovascular: S1-S2 heard. Abdomen: Soft nontender bowel sound present. Musculoskeletal: Left leg is in a boot. Skin: No rash. Neurologic: Alert awake oriented time place and person.  Moves all extremities. Psychiatric: Appears normal.  Normal affect.   Labs on Admission: I have personally reviewed following labs and imaging studies  CBC: Recent Labs  Lab 01/22/24 1618  WBC 8.3  NEUTROABS 6.3  HGB 12.2  HCT 36.3  MCV 94.0  PLT 292   Basic Metabolic Panel: Recent Labs  Lab 01/22/24 1618 01/22/24 1805  NA 136  --   K 4.4  --   CL 99  --   CO2 27  --   GLUCOSE 170*  --   BUN 33*  --   CREATININE 1.24*  --   CALCIUM  9.0  --   MG  --  1.8   GFR: Estimated Creatinine Clearance: 30.4 mL/min (A) (by C-G formula based on SCr of 1.24 mg/dL (H)). Liver Function Tests: Recent Labs  Lab 01/22/24 1618  AST 29  ALT 25  ALKPHOS 80  BILITOT 1.0  PROT 6.4*  ALBUMIN  3.5   No results for input(s): "LIPASE", "AMYLASE" in the last 168 hours. No results for input(s): "AMMONIA" in the last 168 hours. Coagulation Profile: No results for input(s): "INR", "PROTIME" in the last 168 hours. Cardiac Enzymes: No results for input(s): "CKTOTAL", "CKMB", "CKMBINDEX", "TROPONINI" in the last 168 hours. BNP (last 3 results) Recent Labs     09/27/23 1421 12/20/23 1615  PROBNP 91.0 524   HbA1C: No results for input(s): "HGBA1C" in the last 72 hours. CBG: No results for input(s): "GLUCAP" in the last 168 hours. Lipid Profile: No results for input(s): "CHOL", "HDL", "LDLCALC", "TRIG", "CHOLHDL", "LDLDIRECT" in the last 72 hours. Thyroid  Function Tests: No results for input(s): "TSH", "T4TOTAL", "FREET4", "T3FREE", "THYROIDAB" in the last 72 hours. Anemia Panel: No results for input(s): "VITAMINB12", "FOLATE", "FERRITIN", "TIBC", "IRON", "RETICCTPCT" in the last 72 hours. Urine analysis:    Component Value Date/Time   COLORURINE YELLOW 01/22/2024 1633   APPEARANCEUR CLOUDY (A) 01/22/2024 1633   LABSPEC 1.010 01/22/2024 1633   PHURINE 6.0 01/22/2024 1633   GLUCOSEU >=500 (A) 01/22/2024 1633   HGBUR NEGATIVE 01/22/2024 1633   BILIRUBINUR NEGATIVE 01/22/2024 1633   BILIRUBINUR negative 01/17/2024 1754   BILIRUBINUR negative 08/04/2022 1614   KETONESUR NEGATIVE 01/22/2024 1633   PROTEINUR NEGATIVE 01/22/2024 1633   UROBILINOGEN 0.2 01/17/2024 1754   NITRITE NEGATIVE 01/22/2024 1633   LEUKOCYTESUR LARGE (A) 01/22/2024 1633   Sepsis Labs: @LABRCNTIP (procalcitonin:4,lacticidven:4) ) Recent Results (from the past 240 hours)  Urine Culture     Status: Abnormal   Collection Time: 01/17/24  5:59 PM   Specimen: Urine, Clean Catch  Result Value Ref Range Status   Specimen Description URINE, CLEAN CATCH  Final   Special Requests NONE  Final   Culture (A)  Final    20,000 COLONIES/mL ENTEROCOCCUS FAECALIS 10,000 COLONIES/mL STREPTOCOCCUS AGALACTIAE TESTING AGAINST S. AGALACTIAE NOT ROUTINELY PERFORMED DUE TO PREDICTABILITY OF AMP/PEN/VAN SUSCEPTIBILITY. Performed at [facility], [address] [place], [place] [address]    Report Status 01/21/2024 FINAL  Final   Organism ID, Bacteria ENTEROCOCCUS FAECALIS (A)  Final      Susceptibility   Enterococcus faecalis - MIC*    AMPICILLIN <=2 SENSITIVE Sensitive      NITROFURANTOIN  <=16 SENSITIVE Sensitive     VANCOMYCIN  1 SENSITIVE Sensitive     * 20,000 COLONIES/mL ENTEROCOCCUS FAECALIS     Radiological Exams on Admission: DG Ankle 2 Views Left Result Date: 01/22/2024 CLINICAL DATA:  Left ankle pain EXAM: LEFT ANKLE - 2 VIEW COMPARISON:  Foot radiographs 01/17/2024 FINDINGS: Distal fibular plate and screw fixator without complicating feature. Atheromatous vascular calcifications. Mild deformity and faint lucency along the medial malleolus compatible with old healed fracture. Plantar calcaneal spur. Degenerative findings in the midfoot and Lisfranc joint. No acute bony finding is observed on  this two view series. IMPRESSION: 1. No acute findings. 2. Distal fibular plate and screw fixator without complicating feature. 3. Old healed fracture of the medial malleolus. 4. Degenerative findings in the midfoot and Lisfranc joint. 5. Plantar calcaneal spur. 6. Atheromatous vascular calcifications. Electronically Signed   By: Freida Jes M.D.   On: 01/22/2024 17:40   DG Chest Portable 1 View Result Date: 01/22/2024 CLINICAL DATA:  Weakness.  Left ankle pain. EXAM: PORTABLE CHEST 1 VIEW COMPARISON:  12/22/2023 FINDINGS: Right upper lobe scarring appear stable. Prominent and asymmetric degenerative glenohumeral arthropathy on the right. Thoracic spondylosis. Heart size within normal limits for projection. No blunting of the costophrenic angles. IMPRESSION: 1. No acute findings. 2. Right upper lobe scarring. 3. Thoracic spondylosis. 4. Prominent and asymmetric degenerative glenohumeral arthropathy on the right. Electronically Signed   By: Freida Jes M.D.   On: 01/22/2024 17:37    EKG: Independently reviewed.  Normal sinus rhythm.  Assessment/Plan Principal Problem:   Syncope Active Problems:   Protein S deficiency (HCC)   Uncontrolled hypertension   Type 2 diabetes mellitus with diabetic neuropathy, with long-term current use of insulin  (HCC)   Stage 3b  chronic kidney disease (HCC)   NICM (nonischemic cardiomyopathy) (HCC)    Syncope -    appreciate cardiology consult.  Since patient is orthostatic patient did receive fluid bolus likely causing her symptoms.  Appreciate cardiology recommendations presently holding Imdur  Lasix  Farxiga  recheck orthostatics in the morning.  Patient last 2D echo was showing EF of 45 to 50% on 12/08/2023.  Cardiac cath on 12/07/2023 did not show any evidence of CAD.  Check cortisol levels. Diabetes mellitus type 2 last hemoglobin A1c was 7.1 on Trulicity  and NovoLog  6 units premeals. 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 Chronic kidney disease stage III creatinine around baseline. Possible history of paroxysmal atrial fibrillation on beta-blockers and Eliquis . Hyperlipidemia on statins. UTI on Augmentin .  Urine culture predominantly grew Enterococcus 1 week ago.  Since patient has recurrent syncope will need close monitoring and further workup and more than 2 midnight stay.   DVT prophylaxis: Eliquis . Code Status: Full code. Family Communication: Patient's family at the bedside. Disposition Plan: Cardiac telemetry. Consults called: Cardiology. Admission status: Observation.

## 2024-01-22 NOTE — Telephone Encounter (Signed)
 Due to line being disconnected and I'm having phone issues I'm putting her call back into the triage queue to attempt to call her back as soon as I can get my phone issues resolved.

## 2024-01-22 NOTE — ED Provider Notes (Signed)
 [place] EMERGENCY DEPARTMENT AT [facility] Provider Note  History  Chief Complaint:  Loss of Consciousness   Loss of Consciousness    Pamela [name] is a 78 y.o. female with a history of CKD, DVT on Eliquis  who presents to the emergency department for multiple syncopal events.  She reports the first 1 was on May 14.  She states that she was at [employer] and felt warm and then was eased to the ground.  She visited urgent care where an x-ray of her left foot was performed which was negative for acute fractures.  She states at that time she was having dysuria and a urine sample was collected and a culture grew Enterococcus faecalis.  She states she has been on no antibiotics.  She states that she continues to have dysuria.  She also complains of suprapubic abdominal pain.  No fevers or chills.  She states that the day she was visiting her doctor because she saw on her medical record that she grew bacteria in her urine and was continued to have symptoms.  She states that before she went to the doctor she had another episode of feeling warm and lightheaded with mild chest discomfort.  She states that she did not fall or hit her head.  She states that currently she only has suprapubic abdominal pain.  She denies any chest pain, shortness of breath, nausea or vomiting currently.  EMS provides history who states that the patient was at a doctor's office.  They state that patient had a syncopal event for approximately 2 minutes.  Doctors office checked blood pressure and is 89/42.  On EMS arrival patient was more alert and her blood pressure was 104/52.  When they stood her up her pressure dropped to 94/42.  She has been having generalized weakness for 1 week.  She denied chest pain with them.  Past Medical History:  Diagnosis Date   Anticoagulated on Coumadin     chronic--- managed by hematology/ oncology,   Asthma, mild intermittent    followed by pcp--- per pt last  exacerbation winter 2021 w/ acute bronchitis   Bilateral leg cramps    Blood dyscrasia    Chronic constipation    CKD (chronic kidney disease), stage III (HCC)    Closed bimalleolar fracture of left ankle 02/26/2020   DDD (degenerative disc disease), cervical    w/ spondylosis,  per pt last steroid injection 06/ 2022   DDD (degenerative disc disease), lumbosacral    DVT (deep venous thrombosis) (HCC) 07/30/2013   Dyspnea    occasionally   Dysrhythmia    GERD (gastroesophageal reflux disease)    History of cardiac murmur as a child    History of DVT of lower extremity    left lower extremity in 1980s, fell when bowling   History of pulmonary embolus (PE) 1993   per pt left lung post op 2 wks cholecystectomy   History of rheumatic fever as a child    per last echo 12-31-2019 no valvular issues   History of TIA (transient ischemic attack)    2014 and 2018 or 2019,  per pt no residual   HTN (hypertension)    followed by pcp   Hyperlipidemia    Hypothyroidism    IDA (iron deficiency anemia)    Macular degeneration of both eyes    Mild obstructive sleep apnea    per pt dx 2017 tried to uses cpap but intolerant   Mixed incontinence urge and  stress    urologist--- dr Reginal Capra   OA (osteoarthritis) 07/06/2018   Osteoporosis    PAF (paroxysmal atrial fibrillation) (HCC) 10/01/2014   cardiologist--- dr Chyrl Crawford tobb;   cardiac cath 02-18-2013 normal coronaries arteries, ef 50%, cath done since echo showed ef 30-35%; nucleat stress study 04/ 2020 normal , normal echo 04/ 2021,  event monitor 09-14-2020 rare ST/AT variable block   Pneumonia    PONV (postoperative nausea and vomiting)    Protein S deficiency (HCC)    followed by hemotology/ oncology-- dr d. Harles Lied ([facility]) dx 1980s;  prior DVT left lower leg 1980s and left lung PE 1993; chronic  coumadin  since 1980s   PVC's (premature ventricular contractions)    followed by cardiology   S/P cardiac catheterization 02/2013    Normal coronaries; low normal EF at 50%   Solitary pulmonary nodule on lung CT 02/06/2019   First noted 01/13/2014 > no change as of 12/21/2018   Spondylolisthesis, lumbar region 08/09/2018   Stroke (HCC)    TIA in 2018 or 2019   Transient ischemic attack 07/30/2013   Type 2 diabetes mellitus treated with insulin  (HCC)    endocrilogist--- whitney reardon NP     (03-10-2021  pt continuously checks blood sugar throughout the day w/ Libre, fasting sugar --- 69--200)   Wears glasses    Wears hearing aid in both ears     Past Surgical History:  Procedure Laterality Date   ABDOMINAL HYSTERECTOMY     BLEPHAROPLASTY     CARDIAC CATHETERIZATION  02/18/2013   @ [facility]  by Dr [place];  normal coronaries w/ preserved LVF, ef 50%;   previous cath 2001 normal ef 65%   CARPAL TUNNEL RELEASE Bilateral 1994   CATARACT EXTRACTION W/ INTRAOCULAR LENS IMPLANT Bilateral 2017   CHOLECYSTECTOMY     CHOLECYSTECTOMY, LAPAROSCOPIC  1993   COLONOSCOPY     EAR BIOPSY Left    FINGER SURGERY Left 2018   thumb   FOOT SURGERY     FOOT TENDON SURGERY Right    early 2000s   FRACTURE SURGERY Left 02/13/2020   left femur   HAND SURGERY Right 04/2022   HARDWARE REMOVAL Left 03/12/2021   Procedure: HARDWARE REMOVAL;  Surgeon: Janeth Medicus, MD;  Location: [facility];  Service: Orthopedics;  Laterality: Left;  60 MINS   JOINT REPLACEMENT     LEFT HEART CATH AND CORONARY ANGIOGRAPHY N/A 12/07/2023   Procedure: LEFT HEART CATH AND CORONARY ANGIOGRAPHY;  Surgeon: Odie Benne, MD;  Location: [facility] INVASIVE CV LAB;  Service: Cardiovascular;  Laterality: N/A;   LUMBAR LAMINECTOMY/DECOMPRESSION MICRODISCECTOMY N/A 12/22/2022   Procedure: Lumbar Two-Lumbar Three, Lumbar Three-Lumbar Four Lumbar Decompression;  Surgeon: Elna Haggis, MD;  Location: [facility] OR;  Service: Neurosurgery;  Laterality: N/A;  RM 20 3C   ORIF ANKLE FRACTURE Left 02/26/2020   Procedure: OPEN REDUCTION INTERNAL FIXATION (ORIF)  ANKLE FRACTURE;  Surgeon: Janeth Medicus, MD;  Location: [facility] OR;  Service: Orthopedics;  Laterality: Left;  90 mins   TONSILLECTOMY AND ADENOIDECTOMY Bilateral    TOTAL KNEE ARTHROPLASTY Left 03/2005   TOTAL VAGINAL HYSTERECTOMY  1988   per pt still has ovaries    Family History  Problem Relation Age of Onset   Cirrhosis Mother    Antithrombin III deficiency Mother        multiple emboli   Ulcerative colitis Mother    Diabetes Father    Coronary artery disease Father  Heart attack Father    Hypertension Father    Kidney disease Father    Hypertension Sister    Diabetes Sister    Heart attack Sister        age 34    Protein S deficiency Daughter    Heart attack Brother    Hypertension Brother    Lung cancer Brother    Stroke Neg Hx    Colitis Neg Hx    Colon polyps Neg Hx    Esophageal cancer Neg Hx    Liver cancer Neg Hx    Pancreatic cancer Neg Hx    Rectal cancer Neg Hx    Stomach cancer Neg Hx    Breast cancer Neg Hx     Social History   Tobacco Use   Smoking status: Never   Smokeless tobacco: Never  Vaping Use   Vaping status: Never Used  Substance Use Topics   Alcohol use: No   Drug use: Never    Review of Systems  Review of Systems  Cardiovascular:  Positive for syncope.     Reviewed and documented in HPI if pertinent.   Physical Exam   ED Triage Vitals [01/22/24 1557]  Encounter Vitals Group     BP (!) 164/60     Systolic BP Percentile      Diastolic BP Percentile      Pulse Rate 72     Resp 17     Temp 97.9 F (36.6 C)     Temp Source Oral     SpO2 100 %     Weight      Height      Head Circumference      Peak Flow      Pain Score      Pain Loc      Pain Education      Exclude from Growth Chart      Physical Exam Vitals and nursing note reviewed.  Constitutional:      General: She is not in acute distress.    Appearance: She is well-developed.  HENT:     Head: Normocephalic and atraumatic.  Eyes:      Conjunctiva/sclera: Conjunctivae normal.  Cardiovascular:     Rate and Rhythm: Normal rate and regular rhythm.     Heart sounds: No murmur heard. Pulmonary:     Effort: Pulmonary effort is normal. No respiratory distress.     Breath sounds: Normal breath sounds.  Abdominal:     Palpations: Abdomen is soft.     Tenderness: There is no abdominal tenderness.  Musculoskeletal:        General: No swelling.     Cervical back: Neck supple.     Right lower leg: No edema.     Left lower leg: No edema.  Skin:    General: Skin is warm and dry.     Capillary Refill: Capillary refill takes less than 2 seconds.  Neurological:     Mental Status: She is alert.  Psychiatric:        Mood and Affect: Mood normal.      Procedures   Procedures  ED Course - Medical Decision Making  Brief Overview Trinna Garey Jung Laiken Sandy is a 78 y.o. female who presents as per above.  I have reviewed the nursing documentation for past medical history, family history, and social history and agree.  I have reviewed the patient's vital signs. Mild hypertension.  Initial Differential Diagnoses: I am primarily concerned for arrhythmia, ACS,  UTI, AKI, electrolyte abnormalities.  Therapies: These medications and interventions were provided for the patient while in the ED.  Medications  lactated ringers  bolus 500 mL (0 mLs Intravenous Stopped 01/22/24 1819)    Testing Results: On my interpretation labs are significant for : Initial troponin 12, repeat 10 Magnesium  1.8 Creatinine 1.24 UA with few bacteria  On my interpretation imaging is significant for: Ankle x-ray negative for acute fracture Chest x-ray without opacity  I interpreted the ECG at 15:54. It reveals a sinus rhythm. The QTc, PR, and QRS are appropriate. There are no signs of acute ischemia or of significant electrical abnormalities. The ECG does not show a STEMI. There are no ST depressions. There are no T wave inversions. There is no  evidence of a High-Grade Conduction Block, WPW, Brugada Sign, ARVC, DeWinters T Waves, or Wellens Waves.  See the EMR for full details regarding lab and imaging results.   Medical Decision Making 78 year old female who presents the emergency department as above.  On initial evaluation patient is GCS 15.  Her lungs are clear to auscultation bilaterally.  She is mildly hypertensive.  She has no overt signs of volume overload.  Her abdomen soft nontender.  Patient states she has mild suprapubic abdominal pain and dysuria.  Laboratory studies reveal normal cardiac biomarkers.  Creatinine mildly elevated.  UA with few bacteria.  On review of prior culture data patient did grow 20,000 CFU of Enterococcus faecalis and 10,000 CFU Streptococcus agalactiae.  EKG is sinus rhythm.  No overt arrhythmia.  No overt ischemia.  Regarding her urine culture as well as her symptoms we will place patient on antibiotics.  Given that she will be in the hospital we will initiate ampicillin treatment.  Patient does have a cardiac monitor in place.  On review of prior records patient has seen cardiology in the outpatient setting.  In 2024 patient did have NSVT.  I contacted cardiology regarding patient.  Given that patient has now had multiple syncopal episodes we do feel the patient requires admission for observation.  Formal cardiology consult would occur in the morning.  I discussed with hospital medicine.  They will admit the patient to their service.  No further emergent interventions currently.  Problems Addressed: Near syncope: acute illness or injury that poses a threat to life or bodily functions  Amount and/or Complexity of Data Reviewed Labs: ordered. Radiology: ordered.  Risk Prescription drug management. Decision regarding hospitalization.     ### All radiography studies, electrocardiograms, and laboratory data were personally reviewed by me and incorporated into my medical decision  making. Impression   1. Near syncope      Note: Chief Executive Officer was used in the creation of this note.     Arminda Landmark, MD 01/22/24 2147    Celesta Coke K, DO 01/22/24 2300

## 2024-01-22 NOTE — Assessment & Plan Note (Signed)
 Atrial fibrillation. She is on a heart monitor. No acute arrhythmias noted during this encounter.

## 2024-01-22 NOTE — Assessment & Plan Note (Signed)
>>  ASSESSMENT AND PLAN FOR HTN (HYPERTENSION) WRITTEN ON 01/22/2024  2:59 PM BY JACOBS, DINA M, FNP  Hypotension with dizziness, lightheadedness, near syncope, cold sweats, nausea, and altered mental status. Blood pressure 89/40 mmHg. Possible orthostatic hypotension. Recent cardiology evaluation suggested holding some antihypertensive medications. - Call EMS for transport to the hospital due to altered mental status and hypotension. - Consider holding or adjusting antihypertensive medications, specifically lisinopril  or Imdur , if symptoms persist.

## 2024-01-22 NOTE — Assessment & Plan Note (Signed)
 Urinary tract infection with dysuria. Culture showed bacterial growth. New prescription for antibiotics was sent but not yet picked up. - Ensure she receives prescribed antibiotics.

## 2024-01-22 NOTE — Progress Notes (Signed)
 Acute Office Visit  Subjective:    Patient ID: Pamela [name], female    DOB: Aug 28, 1946, 78 y.o.   MRN: [id number]  Chief Complaint  Patient presents with   Dizziness   Urinary Tract Infection    Discussed the use of AI scribe software for clinical note transcription with the patient, who gave verbal consent to proceed.  History of Present Illness   Pamela [name] is a 78 year old female with hypertension and coronary artery disease who presents with dizziness and lightheadedness.  She experiences dizziness, lightheadedness, and nausea, which she associates with hypotension. She describes episodes where her vision 'started going black,' accompanied by sweating and nausea, making her feel as though she might pass out. These symptoms have occurred multiple times, including this morning, with a recorded blood pressure of 187 over an unknown value. She is on a heart monitor and is scheduled to follow up with her cardiologist in 30 days. Her medications include Lasix , which was recently reduced by her cardiologist (but she is still taking), and lisinopril  20 mg twice a day.  She reports upper abdominal pain and was previously put on a proton pump inhibitor, although she notes that the pain is not in the area typically associated with PPI treatment.  She has a urinary tract infection for which a new prescription was sent this morning. She has not yet picked up the medication. She experiences burning during urination and some abdominal pain.  She has a history of diabetes, chronic kidney disease, and hyperlipidemia. Her blood sugar was 99 during the visit. No recent hypoglycemic episodes.       Past Medical History:  Diagnosis Date   Anticoagulated on Coumadin     chronic--- managed by hematology/ oncology,   Asthma, mild intermittent    followed by pcp--- per pt last exacerbation winter 2021 w/ acute bronchitis   Bilateral leg cramps    Blood dyscrasia     Chronic constipation    CKD (chronic kidney disease), stage III (HCC)    Closed bimalleolar fracture of left ankle 02/26/2020   DDD (degenerative disc disease), cervical    w/ spondylosis,  per pt last steroid injection 06/ 2022   DDD (degenerative disc disease), lumbosacral    DVT (deep venous thrombosis) (HCC) 07/30/2013   Dyspnea    occasionally   Dysrhythmia    GERD (gastroesophageal reflux disease)    History of cardiac murmur as a child    History of DVT of lower extremity    left lower extremity in 1980s, fell when bowling   History of pulmonary embolus (PE) 1993   per pt left lung post op 2 wks cholecystectomy   History of rheumatic fever as a child    per last echo 12-31-2019 no valvular issues   History of TIA (transient ischemic attack)    2014 and 2018 or 2019,  per pt no residual   HTN (hypertension)    followed by pcp   Hyperlipidemia    Hypothyroidism    IDA (iron deficiency anemia)    Macular degeneration of both eyes    Mild obstructive sleep apnea    per pt dx 2017 tried to uses cpap but intolerant   Mixed incontinence urge and stress    urologist--- dr Reginal Capra   OA (osteoarthritis) 07/06/2018   Osteoporosis    PAF (paroxysmal atrial fibrillation) (HCC) 10/01/2014   cardiologist--- dr Chyrl Crawford tobb;   cardiac cath 02-18-2013 normal coronaries arteries, ef 50%, cath  done since echo showed ef 30-35%; nucleat stress study 04/ 2020 normal , normal echo 04/ 2021,  event monitor 09-14-2020 rare ST/AT variable block   Pneumonia    PONV (postoperative nausea and vomiting)    Protein S deficiency (HCC)    followed by hemotology/ oncology-- dr d. Harles Lied ([facility]) dx 1980s;  prior DVT left lower leg 1980s and left lung PE 1993; chronic  coumadin  since 1980s   PVC's (premature ventricular contractions)    followed by cardiology   S/P cardiac catheterization 02/2013   Normal coronaries; low normal EF at 50%   Solitary pulmonary nodule on lung CT 02/06/2019    First noted 01/13/2014 > no change as of 12/21/2018   Spondylolisthesis, lumbar region 08/09/2018   Stroke (HCC)    TIA in 2018 or 2019   Transient ischemic attack 07/30/2013   Type 2 diabetes mellitus treated with insulin  (HCC)    endocrilogist--- whitney reardon NP     (03-10-2021  pt continuously checks blood sugar throughout the day w/ Libre, fasting sugar --- 69--200)   Wears glasses    Wears hearing aid in both ears     Past Surgical History:  Procedure Laterality Date   ABDOMINAL HYSTERECTOMY     BLEPHAROPLASTY     CARDIAC CATHETERIZATION  02/18/2013   @ [facility]  by Dr Sw[place];  normal coronaries w/ preserved LVF, ef 50%;   previous cath 2001 normal ef 65%   CARPAL TUNNEL RELEASE Bilateral 1994   CATARACT EXTRACTION W/ INTRAOCULAR LENS IMPLANT Bilateral 2017   CHOLECYSTECTOMY     CHOLECYSTECTOMY, LAPAROSCOPIC  1993   COLONOSCOPY     EAR BIOPSY Left    FINGER SURGERY Left 2018   thumb   FOOT SURGERY     FOOT TENDON SURGERY Right    early 2000s   FRACTURE SURGERY Left 02/13/2020   left femur   HAND SURGERY Right 04/2022   HARDWARE REMOVAL Left 03/12/2021   Procedure: HARDWARE REMOVAL;  Surgeon: Janeth Medicus, MD;  [facility];  Service: Orthopedics;  Laterality: Left;  60 MINS   JOINT REPLACEMENT     LEFT HEART CATH AND CORONARY ANGIOGRAPHY N/A 12/07/2023   Procedure: LEFT HEART CATH AND CORONARY ANGIOGRAPHY;  Surgeon: Odie Benne, MD;  Location: [facility] IN[facility]IVE [facility];  Service: Cardiovascular;  Laterality: N/A;   LUMBAR LAMINECTOMY/DECOMPRESSION MICRODISCECTOMY N/A 12/22/2022   Procedure: Lumbar Two-Lumbar Three, Lumbar Three-Lumbar Four Lumbar Decompression;  Surgeon: Elna Haggis, MD;  Location: [facility] OR;  Service: Neurosurgery;  Laterality: N/A;  RM 20 3C   ORIF ANKLE FRACTURE Left 02/26/2020   Procedure: OPEN REDUCTION INTERNAL FIXATION (ORIF) ANKLE FRACTURE;  Surgeon: Janeth Medicus, MD;  Location: [facility] OR;  Service: Orthopedics;   Laterality: Left;  90 mins   TONSILLECTOMY AND ADENOIDECTOMY Bilateral    TOTAL KNEE ARTHROPLASTY Left 03/2005   TOTAL VAGINAL HYSTERECTOMY  1988   per pt still has ovaries    Family History  Problem Relation Age of Onset   Cirrhosis Mother    Antithrombin III deficiency Mother        multiple emboli   Ulcerative colitis Mother    Diabetes Father    Coronary artery disease Father    Heart attack Father    Hypertension Father    Kidney disease Father    Hypertension Sister    Diabetes Sister    Heart attack Sister        age 52  Protein S deficiency Daughter    Heart attack Brother    Hypertension Brother    Lung cancer Brother    Stroke Neg Hx    Colitis Neg Hx    Colon polyps Neg Hx    Esophageal cancer Neg Hx    Liver cancer Neg Hx    Pancreatic cancer Neg Hx    Rectal cancer Neg Hx    Stomach cancer Neg Hx    Breast cancer Neg Hx     Social History   Socioeconomic History   Marital status: Married    Spouse name: Drexel Gentles   Number of children: Not on file   Years of education: Not on file   Highest education level: Some college, no degree  Occupational History   Not on file  Tobacco Use   Smoking status: Never   Smokeless tobacco: Never  Vaping Use   Vaping status: Never Used  Substance and Sexual Activity   Alcohol use: No   Drug use: Never   Sexual activity: Not on file    Comment: Hysterectomy  Other Topics Concern   Not on file  Social History Narrative   Lives with spouse in [place].  2 grown daughters.   Retired Print production planner     Social Drivers of Corporate investment banker Strain: Low Risk  (10/22/2023)   Overall Financial Resource Strain (CARDIA)    Difficulty of Paying Living Expenses: Not very hard  Food Insecurity: No Food Insecurity (12/07/2023)   Hunger Vital Sign    Worried About Running Out of Food in the Last Year: Never true    Ran Out of Food in the Last Year: Never true  Transportation Needs: No Transportation Needs  (12/07/2023)   PRAPARE - Administrator, Civil Service (Medical): No    Lack of Transportation (Non-Medical): No  Physical Activity: Inactive (10/22/2023)   Exercise Vital Sign    Days of Exercise per Week: 0 days    Minutes of Exercise per Session: 30 min  Stress: No Stress Concern Present (10/22/2023)   [employer] of Occupational Health - Occupational Stress Questionnaire    Feeling of Stress : Only a little  Social Connections: Socially Integrated (12/07/2023)   Social Connection and Isolation Panel [NHANES]    Frequency of Communication with Friends and Family: More than three times a week    Frequency of Social Gatherings with Friends and Family: Patient declined    Attends Religious Services: More than 4 times per year    Active Member of [employer] or Organizations: Patient declined    Attends Engineer, structural: More than 4 times per year    Marital Status: Married  Catering manager Violence: Not At Risk (12/07/2023)   Humiliation, Afraid, Rape, and Kick questionnaire    Fear of Current or Ex-Partner: No    Emotionally Abused: No    Physically Abused: No    Sexually Abused: No    Outpatient Medications Prior to Visit  Medication Sig Dispense Refill   acetaminophen  (TYLENOL ) 650 MG CR tablet Take 650-1,300 mg by mouth every 8 (eight) hours as needed for pain.     albuterol  (VENTOLIN  HFA) 108 (90 Base) MCG/ACT inhaler INHALE 1 TO 2 PUFFS INTO THE LUNGS EVERY 6 HOURS AS NEEDED FOR WHEEZING OR SHORTNESS OF BREATH 6.7 g 3   aspirin  EC (ASPIRIN  LOW DOSE) 81 MG tablet Take 1 tablet (81 mg total) by mouth daily. Swallow whole. 90 tablet 3   atorvastatin  (  LIPITOR ) 80 MG tablet TAKE 1 TABLET(80 MG) BY MOUTH DAILY 90 tablet 3   carboxymethylcellulose (REFRESH PLUS) 0.5 % SOLN Place 1 drop into both eyes 3 (three) times daily as needed (dry eyes).     carvedilol  (COREG ) 12.5 MG tablet Take 1 tablet (12.5 mg total) by mouth 2 (two) times daily with a meal. 60 tablet 3    cephALEXin  (KEFLEX ) 500 MG capsule Take 1 capsule (500 mg total) by mouth 2 (two) times daily. 14 capsule 0   Cholecalciferol (VITAMIN D3) 50 MCG (2000 UT) TABS Take 4,000 Units by mouth in the morning and at bedtime.     cyanocobalamin  (VITAMIN B12) 1000 MCG tablet Take 1,000 mcg by mouth daily with lunch.     dapagliflozin  propanediol (FARXIGA ) 10 MG TABS tablet Take 1 tablet (10 mg total) by mouth daily before breakfast. 90 tablet 3   Dulaglutide  (TRULICITY ) 0.75 MG/0.5ML SOAJ Inject 0.75 mg into the skin once a week. 6 mL 3   ELIQUIS  5 MG TABS tablet TAKE 1 TABLET(5 MG) BY MOUTH TWICE DAILY 60 tablet 2   estradiol (ESTRACE) 0.1 MG/GM vaginal cream Place 1 Applicatorful vaginally 2 (two) times a week. As needed     ferrous sulfate  325 (65 FE) MG tablet Take 325 mg by mouth daily with lunch.     furosemide  (LASIX ) 20 MG tablet Take 1 tablet (20 mg total) by mouth daily. (Patient taking differently: Take 20 mg by mouth daily as needed for fluid or edema.) 90 tablet 0   GEMTESA  75 MG TABS Take 1 tablet (75 mg total) by mouth at bedtime. 90 tablet 3   insulin  aspart (NOVOLOG  FLEXPEN) 100 UNIT/ML FlexPen Max daily 30 units (Patient taking differently: Inject 6 Units into the skin 3 (three) times daily with meals. Sliding scale Max daily 30 units) 30 mL 3   Insulin  Pen Needle (BD PEN NEEDLE NANO 2ND GEN) 32G X 4 MM MISC 1 Device by Other route in the morning, at noon, in the evening, and at bedtime. USE TO INJECT INSULIN  four times DAILY (Patient taking differently: 1 Device by Other route 3 (three) times daily. USE TO INJECT INSULIN  four times DAILY) 400 each 3   isosorbide  mononitrate (IMDUR ) 30 MG 24 hr tablet Take 1 tablet (30 mg total) by mouth daily. 90 tablet 3   lisinopril  (ZESTRIL ) 20 MG tablet TAKE 1 TABLETS BY MOUTH TWICE DAILY 180 tablet 3   magnesium  oxide (MAG-OX) 400 (240 Mg) MG tablet TAKE 2 TABLETS(800 MG) BY MOUTH TWICE DAILY (Patient taking differently: Take 800 mg by mouth 2 (two)  times daily.) 120 tablet 2   Multiple Vitamins-Minerals (PRESERVISION AREDS PO) Take 1 capsule by mouth 2 (two) times daily.     nystatin cream (MYCOSTATIN) Apply 1 Application topically 4 (four) times daily as needed for dry skin (yeast infection).     ondansetron  (ZOFRAN -ODT) 8 MG disintegrating tablet Take 8 mg by mouth every 8 (eight) hours as needed for nausea or vomiting.      pantoprazole  (PROTONIX ) 40 MG tablet TAKE 1 TABLET(40 MG) BY MOUTH EVERY MORNING 90 tablet 1   pyridoxine (B-6) 100 MG tablet Take 100 mg by mouth daily with lunch.     sennosides-docusate sodium  (SENOKOT-S) 8.6-50 MG tablet Take 1 tablet by mouth daily as needed for constipation.     sodium chloride  (OCEAN) 0.65 % SOLN nasal spray Place 1 spray into both nostrils as needed for congestion.     zolpidem  (AMBIEN ) 5 MG  tablet TAKE 1 TABLET(5 MG) BY MOUTH AT BEDTIME AS NEEDED FOR SLEEP (Patient taking differently: Take 5 mg by mouth at bedtime.) 30 tablet 5   Continuous Glucose Sensor (FREESTYLE LIBRE 2 SENSOR) MISC 2 each by Does not apply route every 14 (fourteen) days. E11.40 6 each 3   No facility-administered medications prior to visit.    Allergies  Allergen Reactions   Ketek [Telithromycin] Nausea And Vomiting and Rash   Loxapine Succinate Hives   Macrodantin  [Nitrofurantoin  Macrocrystal] Other (See Comments)    Pulmonary fibrosis   Naproxen Sodium Anaphylaxis and Hives   Amoxapine And Related Hives   Darvon Nausea And Vomiting   Belsomra [Suvorexant] Other (See Comments)    "Nightmares"   Cymbalta [Duloxetine Hcl] Other (See Comments)    "Blackouts"   Duloxetine    Gabapentin  Other (See Comments)    Blurry vision   Lyrica  [Pregabalin ]     Makes her sedated   Ozempic  (0.25 Or 0.5 Mg-Dose) [Semaglutide (0.25 Or 0.5mg -Dos)] Diarrhea   Semaglutide  Diarrhea    Rybelsus    Amoxicillin  Rash   Propoxyphene Nausea And Vomiting    Review of Systems  Constitutional:  Positive for fatigue. Negative for  chills, diaphoresis and fever.  HENT:  Negative for congestion, ear pain and sinus pain.   Eyes: Negative.   Respiratory:  Negative for cough and shortness of breath.   Cardiovascular:  Positive for chest pain (2/10).  Gastrointestinal:  Positive for nausea. Negative for abdominal pain, constipation, diarrhea and vomiting.  Endocrine: Negative.   Genitourinary:  Positive for dysuria and urgency.  Musculoskeletal:  Negative for arthralgias.  Skin: Negative.   Allergic/Immunologic: Negative.   Neurological:  Positive for dizziness, syncope and light-headedness. Negative for weakness and headaches.  Hematological: Negative.   Psychiatric/Behavioral:  Negative for dysphoric mood. The patient is not nervous/anxious.        Objective:         01/22/2024    2:36 PM 01/22/2024    2:04 PM 01/18/2024    2:03 PM  Vitals with BMI  Height  5\' 0"    Weight  138 lbs 6 oz   BMI  27.03   Systolic 89 102 131  Diastolic 40 60 70  Pulse 59 66 72    Orthostatic VS for the past 72 hrs (Last 3 readings):  Patient Position BP Location  01/22/24 1436 Sitting Right Arm     Physical Exam Vitals reviewed.  Constitutional:      General: She is not in acute distress.    Appearance: Normal appearance.  HENT:     Nose: Rhinorrhea present.  Eyes:     Comments: Pin point pupils, minimally reactive to light  Cardiovascular:     Rate and Rhythm: Rhythm irregular.     Heart sounds: Normal heart sounds. No murmur heard. Pulmonary:     Effort: Pulmonary effort is normal.     Breath sounds: Normal breath sounds. No wheezing.  Musculoskeletal:        General: Normal range of motion.  Skin:    General: Skin is warm.  Neurological:     Mental Status: She is oriented to person, place, and time. Mental status is at baseline. She is lethargic.     Cranial Nerves: Cranial nerves 2-12 are intact.     Sensory: Sensation is intact.     Comments: Responsive with loud yelling  Psychiatric:        Mood and  Affect: Mood normal.  Speech: Speech is delayed.        Behavior: Behavior normal. Behavior is cooperative.        Cognition and Memory: Memory normal.     Health Maintenance Due  Topic Date Due   Zoster Vaccines- Shingrix (1 of 2) 09/18/1964   DTaP/Tdap/Td (2 - Td or Tdap) 11/15/2023   COVID-19 Vaccine (8 - Pfizer risk 2024-25 season) 01/14/2024    There are no preventive care reminders to display for this patient.   Lab Results  Component Value Date   TSH 1.97 09/27/2023   Lab Results  Component Value Date   WBC 6.7 12/08/2023   HGB 12.2 12/08/2023   HCT 35.8 (L) 12/08/2023   MCV 90.9 12/08/2023   PLT 219 12/08/2023   Lab Results  Component Value Date   NA 135 01/02/2024   K 4.9 01/02/2024   CO2 20 01/02/2024   GLUCOSE 157 (H) 01/02/2024   BUN 32 (H) 01/02/2024   CREATININE 1.13 (H) 01/02/2024   BILITOT 0.4 01/02/2024   ALKPHOS 99 01/02/2024   AST 25 01/02/2024   ALT 25 01/02/2024   PROT 5.6 (L) 01/02/2024   ALBUMIN  3.8 01/02/2024   CALCIUM  8.8 01/02/2024   ANIONGAP 11 12/08/2023   EGFR 49 (L) 01/02/2024   GFR 41.53 (L) 09/27/2023   Lab Results  Component Value Date   CHOL 136 10/24/2023   Lab Results  Component Value Date   HDL 71 10/24/2023   Lab Results  Component Value Date   LDLCALC 52 10/24/2023   Lab Results  Component Value Date   TRIG 61 10/24/2023   Lab Results  Component Value Date   CHOLHDL 1.9 10/24/2023   Lab Results  Component Value Date   HGBA1C 7.1 (H) 10/24/2023       Assessment & Plan:  Chest pain at rest Assessment & Plan: Chest reported as 2/10 in the office. Pain of 4/10 @ home.  EKG completed - NSR with partial RBBB, ventricular rate - 64, PR -142, QRS - 100, QTc - 435, No ST elevation or depression - correctly wearing a Zio patch and has been feeling bad since Wednesday  Orders: -     EKG 12-Lead  UTI (urinary tract infection) due to Enterococcus Assessment & Plan: Urinary tract infection with  dysuria. Culture showed bacterial growth. New prescription for antibiotics was sent but not yet picked up. - Ensure she receives prescribed antibiotics.   Uncontrolled hypertension Assessment & Plan: Hypotension with dizziness, lightheadedness, near syncope, cold sweats, nausea, and altered mental status. Blood pressure 89/40 mmHg. Possible orthostatic hypotension. Recent cardiology evaluation suggested holding some antihypertensive medications. - Call EMS for transport to the hospital due to altered mental status and hypotension. - Consider holding or adjusting antihypertensive medications, specifically lisinopril  or Imdur , if symptoms persist.   Type 2 diabetes mellitus with diabetic neuropathy, with long-term current use of insulin  (HCC) Assessment & Plan: Diabetes mellitus with blood sugar at 99 mg/dL. No hypoglycemia noted during the episode of altered mental status.   Paroxysmal atrial fibrillation [facility]) Assessment & Plan: Atrial fibrillation. She is on a heart monitor. No acute arrhythmias noted during this encounter.       No orders of the defined types were placed in this encounter.  Follow-up: Return for keep appointment with Dr. Reinhold Carbine.  An After Visit Summary was printed and given to the patient.  Delford Felling, FNP [facility] [phone]

## 2024-01-22 NOTE — Telephone Encounter (Signed)
 Message from Pamela [name] sent at 01/22/2024  9:25 AM EDT  Copied From CRM #[id number]. Reason for Triage:Patient went to the Urgent Care Last Wednesday on 01/17/2024 for falling out. They ran some test on her and the Urine test came back being Abnormal and there are a few words on there that the patient feels scared of for they are unsure. Patient stated that her lower back is hurting and that she is hurting and she just do not know what to do but she will feel so much comfortable with someone explaining to her those words from the lab. Good number to be reached [phone].    Call History  Contact Date/Time Type Contact Phone/Fax By  01/22/2024 09:04 AM EDT Phone (Incoming) Marlynn, Hinckley Ervin Heath [phone] Moore-Cornwel, Nerissa Bannister D   Answer Assessment - Initial Assessment Questions 1. REASON FOR CALL or QUESTION: "What is your reason for calling today?" or "How can I best help you?" or "What question do you have that I can help answer?"     I have questions about my urine culture.   I read on FaceBook that the Keflex  would not work with this infection.    This is the 3rd one I've had in 4 months.  I let pt know that Dr. Reinhold Carbine had called in some Keflex  for her this morning.   Pt has not read her MyChart message.   She was concerned about what she read on Facebook.   I let her know she should go with the advice of her doctor instead of Facebook.   I offered to send a message to Dr. Reinhold Carbine for pt if she was still uncomfortable with taking the Keflex .   She said she would take it but just wondered about all the big words used to describe the infection she has on her urine culture.     The line got disconnected. I called her back but due to phone issues I'm unable to call her back at the moment.  I'm unable to dial  out.   Will put this back in the triage queue and call her back as soon as I can.  Protocols used: Information Only Call - No Triage-A-AH

## 2024-01-22 NOTE — ED Notes (Signed)
X-rat at bedside 

## 2024-01-22 NOTE — Telephone Encounter (Signed)
 Copied from CRM [id number]. Topic: Clinical - Medical Advice >> Jan 19, 2024 10:01 AM Pamela [name] wrote: Reason for CRM: The patient would like a call back regarding medication as soon as possible due to UTI side effects//The patient Cardiologist recommends changes to medication because of UTI//The patient would like only medication recommendations from Dr Cox//Patient has also been experiencing slight blackouts but not fainting//

## 2024-01-22 NOTE — Consult Note (Signed)
 Cardiology Consultation   Patient ID: Pamela [name] MRN: [id number]; DOB: 04/18/1946  Admit date: 01/22/2024 Date of Consult: 01/22/2024  PCP:  [name], MD   [facility] Providers Cardiologist:  Kardie Tobb, DO   {     Patient Profile:   Pamela [name] is a 78 y.o. female with a hx of chronic HFmrEF, PAF, HTN, orthostatic hypotension, HLD, prior TIA, DM2, CKD 3 protein S deficiency who is being seen 01/22/2024 for the evaluation of syncope at the request of Dr Nora Beal.  History of Present Illness:   Pamela [name] 78 yo female history of CAD, chronic HFmrEF, PAF, HTN, orthostatic hypotension, HLD, prior TIA, DM2, CKD 3 protein S deficiency admitted with syncope  Seen in outpatient clinic for episode of near syncope 5/14, currently has on 30 day monitor on. For that episode she had been standing near her car at the grocery store. Sudden onset of weakness, feeling like going to black out. Fell down to her knees but no full LOC. Some people nearby helped her to her car to lay down for several minutes and symptoms resolved.   Repeat episode of symptoms prior to admission. Initially was at home watching her ipad. Sudden onset of severe nausea and urger to vomit, she also had a very strong urge to have a bowel movement. She quickly went to bathroom. Was able to use the bathroom and get back to living room without troubles but felt fatigued and drain after that. She had a prevoiusly scheduled doctors appointment later that day she went to.  While at the doctors office, while sitting sudden loss of consciousness. She denies any significant prodrome. By EMS runs sheet initial bp 89/42. Orthostatic by DBP, dropped 52 to 42. EMS EKG showed NSR, ER EKG NSR. BP improved after IVFs in the ER. Also recent diagnosis of UTI she has not started antibiotics for yet.    WBC 8.3 Hgb 12.2 Plt 292 K 4.4 Cr 1.24 BUN 33 Mg 1.8  Trop 12-->10 EKG NSR CXR no acute  process   12/2023 echo: LVE 45-50%, no WMAs, grade I dd. Normal RV function. Mild MR 12/2023 cath: normal coronaries, LVEDP 27-30 07/2023 monitor: 13 runs of NSVT, longest 6 beats. 9 runs SVT, longest 9 beats. Palpitatoins associated with ectopy       Past Medical History:  Diagnosis Date   Anticoagulated on Coumadin     chronic--- managed by hematology/ oncology,   Asthma, mild intermittent    followed by pcp--- per pt last exacerbation winter 2021 w/ acute bronchitis   Bilateral leg cramps    Blood dyscrasia    Chronic constipation    CKD (chronic kidney disease), stage III (HCC)    Closed bimalleolar fracture of left ankle 02/26/2020   DDD (degenerative disc disease), cervical    w/ spondylosis,  per pt last steroid injection 06/ 2022   DDD (degenerative disc disease), lumbosacral    DVT (deep venous thrombosis) (HCC) 07/30/2013   Dyspnea    occasionally   Dysrhythmia    GERD (gastroesophageal reflux disease)    History of cardiac murmur as a child    History of DVT of lower extremity    left lower extremity in 1980s, fell when bowling   History of pulmonary embolus (PE) 1993   per pt left lung post op 2 wks cholecystectomy   History of rheumatic fever as a child    per last echo 12-31-2019 no valvular issues  History of TIA (transient ischemic attack)    2014 and 2018 or 2019,  per pt no residual   HTN (hypertension)    followed by pcp   Hyperlipidemia    Hypothyroidism    IDA (iron deficiency anemia)    Macular degeneration of both eyes    Mild obstructive sleep apnea    per pt dx 2017 tried to uses cpap but intolerant   Mixed incontinence urge and stress    urologist--- dr Reginal Capra   OA (osteoarthritis) 07/06/2018   Osteoporosis    PAF (paroxysmal atrial fibrillation) (HCC) 10/01/2014   cardiologist--- dr Chyrl Crawford tobb;   cardiac cath 02-18-2013 normal coronaries arteries, ef 50%, cath done since echo showed ef 30-35%; nucleat stress study 04/ 2020 normal , normal  echo 04/ 2021,  event monitor 09-14-2020 rare ST/AT variable block   Pneumonia    PONV (postoperative nausea and vomiting)    Protein S deficiency (HCC)    followed by hemotology/ oncology-- dr d. Harles Lied ([facility]) dx 1980s;  prior DVT left lower leg 1980s and left lung PE 1993; chronic  coumadin  since 1980s   PVC's (premature ventricular contractions)    followed by cardiology   S/P cardiac catheterization 02/2013   Normal coronaries; low normal EF at 50%   Solitary pulmonary nodule on lung CT 02/06/2019   First noted 01/13/2014 > no change as of 12/21/2018   Spondylolisthesis, lumbar region 08/09/2018   Stroke (HCC)    TIA in 2018 or 2019   Transient ischemic attack 07/30/2013   Type 2 diabetes mellitus treated with insulin  (HCC)    endocrilogist--- whitney reardon NP     (03-10-2021  pt continuously checks blood sugar throughout the day w/ Libre, fasting sugar --- 69--200)   Wears glasses    Wears hearing aid in both ears     Past Surgical History:  Procedure Laterality Date   ABDOMINAL HYSTERECTOMY     BLEPHAROPLASTY     CARDIAC CATHETERIZATION  02/18/2013   @ [facility]  by Dr [place];  normal coronaries w/ preserved LVF, ef 50%;   previous cath 2001 normal ef 65%   CARPAL TUNNEL RELEASE Bilateral 1994   CATARACT EXTRACTION W/ INTRAOCULAR LENS IMPLANT Bilateral 2017   CHOLECYSTECTOMY     CHOLECYSTECTOMY, LAPAROSCOPIC  1993   COLONOSCOPY     EAR BIOPSY Left    FINGER SURGERY Left 2018   thumb   FOOT SURGERY     FOOT TENDON SURGERY Right    early 2000s   FRACTURE SURGERY Left 02/13/2020   left femur   HAND SURGERY Right 04/2022   HARDWARE REMOVAL Left 03/12/2021   Procedure: HARDWARE REMOVAL;  Surgeon: Janeth Medicus, MD;  Location: [facility];  Service: Orthopedics;  Laterality: Left;  60 MINS   JOINT REPLACEMENT     LEFT HEART CATH AND CORONARY ANGIOGRAPHY N/A 12/07/2023   Procedure: LEFT HEART CATH AND CORONARY ANGIOGRAPHY;  Surgeon:  Odie Benne, MD;  Location: [facility] INVASIVE CV LAB;  Service: Cardiovascular;  Laterality: N/A;   LUMBAR LAMINECTOMY/DECOMPRESSION MICRODISCECTOMY N/A 12/22/2022   Procedure: Lumbar Two-Lumbar Three, Lumbar Three-Lumbar Four Lumbar Decompression;  Surgeon: Elna Haggis, MD;  Location: [facility] OR;  Service: Neurosurgery;  Laterality: N/A;  RM 20 3C   ORIF ANKLE FRACTURE Left 02/26/2020   Procedure: OPEN REDUCTION INTERNAL FIXATION (ORIF) ANKLE FRACTURE;  Surgeon: Janeth Medicus, MD;  Location: [facility] OR;  Service: Orthopedics;  Laterality: Left;  90 mins  TONSILLECTOMY AND ADENOIDECTOMY Bilateral    TOTAL KNEE ARTHROPLASTY Left 03/2005   TOTAL VAGINAL HYSTERECTOMY  1988   per pt still has ovaries    Inpatient Medications: Scheduled Meds:  Continuous Infusions:  PRN Meds:   Allergies:    Allergies  Allergen Reactions   Ketek [Telithromycin] Nausea And Vomiting and Rash   Loxapine Succinate Hives   Macrodantin  [Nitrofurantoin  Macrocrystal] Other (See Comments)    Pulmonary fibrosis   Naproxen Sodium Anaphylaxis and Hives   Amoxapine And Related Hives   Darvon Nausea And Vomiting   Belsomra [Suvorexant] Other (See Comments)    "Nightmares"   Cymbalta [Duloxetine Hcl] Other (See Comments)    "Blackouts"   Duloxetine    Gabapentin  Other (See Comments)    Blurry vision   Lyrica  [Pregabalin ]     Makes her sedated   Ozempic  (0.25 Or 0.5 Mg-Dose) [Semaglutide (0.25 Or 0.5mg -Dos)] Diarrhea   Semaglutide  Diarrhea    Rybelsus    Amoxicillin  Rash   Propoxyphene Nausea And Vomiting    Social History:   Social History   Socioeconomic History   Marital status: Married    Spouse name: Drexel Gentles   Number of children: Not on file   Years of education: Not on file   Highest education level: Some college, no degree  Occupational History   Not on file  Tobacco Use   Smoking status: Never   Smokeless tobacco: Never  Vaping Use   Vaping status: Never Used  Substance and Sexual  Activity   Alcohol use: No   Drug use: Never   Sexual activity: Not on file    Comment: Hysterectomy  Other Topics Concern   Not on file  Social History Narrative   Lives with spouse in [place].  2 grown daughters.   Retired Print production planner     Social Drivers of Corporate investment banker Strain: Low Risk  (10/22/2023)   Overall Financial Resource Strain (CARDIA)    Difficulty of Paying Living Expenses: Not very hard  Food Insecurity: No Food Insecurity (12/07/2023)   Hunger Vital Sign    Worried About Running Out of Food in the Last Year: Never true    Ran Out of Food in the Last Year: Never true  Transportation Needs: No Transportation Needs (12/07/2023)   PRAPARE - Administrator, Civil Service (Medical): No    Lack of Transportation (Non-Medical): No  Physical Activity: Inactive (10/22/2023)   Exercise Vital Sign    Days of Exercise per Week: 0 days    Minutes of Exercise per Session: 30 min  Stress: No Stress Concern Present (10/22/2023)   [employer] of Occupational Health - Occupational Stress Questionnaire    Feeling of Stress : Only a little  Social Connections: Socially Integrated (12/07/2023)   Social Connection and Isolation Panel [NHANES]    Frequency of Communication with Friends and Family: More than three times a week    Frequency of Social Gatherings with Friends and Family: Patient declined    Attends Religious Services: More than 4 times per year    Active Member of [employer] or Organizations: Patient declined    Attends Engineer, structural: More than 4 times per year    Marital Status: Married  Catering manager Violence: Not At Risk (12/07/2023)   Humiliation, Afraid, Rape, and Kick questionnaire    Fear of Current or Ex-Partner: No    Emotionally Abused: No    Physically Abused: No    Sexually Abused: No  Family History:    Family History  Problem Relation Age of Onset   Cirrhosis Mother    Antithrombin III deficiency Mother         multiple emboli   Ulcerative colitis Mother    Diabetes Father    Coronary artery disease Father    Heart attack Father    Hypertension Father    Kidney disease Father    Hypertension Sister    Diabetes Sister    Heart attack Sister        age 48    Protein S deficiency Daughter    Heart attack Brother    Hypertension Brother    Lung cancer Brother    Stroke Neg Hx    Colitis Neg Hx    Colon polyps Neg Hx    Esophageal cancer Neg Hx    Liver cancer Neg Hx    Pancreatic cancer Neg Hx    Rectal cancer Neg Hx    Stomach cancer Neg Hx    Breast cancer Neg Hx      ROS:  Please see the history of present illness.   All other ROS reviewed and negative.     Physical Exam/Data:   Vitals:   01/22/24 1557 01/22/24 1730 01/22/24 1900  BP: (!) 164/60 (!) 142/56 (!) 167/65  Pulse: 72 75 81  Resp: 17 18 19   Temp: 97.9 F (36.6 C)    TempSrc: Oral    SpO2: 100% 100% 97%   No intake or output data in the 24 hours ending 01/22/24 1929    01/22/2024    2:04 PM 01/18/2024    1:54 PM 12/20/2023    2:07 PM  Last 3 Weights  Weight (lbs) 138 lb 6.4 oz 148 lb 140 lb  Weight (kg) 62.778 kg 67.132 kg 63.504 kg     There is no height or weight on file to calculate BMI.  General:  Well nourished, well developed, in no acute distress HEENT: normal Neck: no JVD Vascular: No carotid bruits; Distal pulses 2+ bilaterally Cardiac:  normal S1, S2; RRR; no murmur  Lungs:  clear to auscultation bilaterally, no wheezing, rhonchi or rales  Abd: soft, nontender, no hepatomegaly  Ext: no edema Musculoskeletal:  No deformities, BUE and BLE strength normal and equal Skin: warm and dry  Neuro:  CNs 2-12 intact, no focal abnormalities noted Psych:  Normal affect   EKG:  The EKG was personally reviewed and demonstrates:   Telemetry:  Telemetry was personally reviewed and demonstrates:    Relevant CV Studies:   Laboratory Data:  High Sensitivity Troponin:   Recent Labs  Lab 01/22/24 1618  01/22/24 1805  TROPONINIHS 12 10     Chemistry Recent Labs  Lab 01/22/24 1618 01/22/24 1805  NA 136  --   K 4.4  --   CL 99  --   CO2 27  --   GLUCOSE 170*  --   BUN 33*  --   CREATININE 1.24*  --   CALCIUM  9.0  --   MG  --  1.8  GFRNONAA 44*  --   ANIONGAP 10  --     Recent Labs  Lab 01/22/24 1618  PROT 6.4*  ALBUMIN  3.5  AST 29  ALT 25  ALKPHOS 80  BILITOT 1.0   Lipids No results for input(s): "CHOL", "TRIG", "HDL", "LABVLDL", "LDLCALC", "CHOLHDL" in the last 168 hours.  Hematology Recent Labs  Lab 01/22/24 1618  WBC 8.3  RBC 3.86*  HGB  12.2  HCT 36.3  MCV 94.0  MCH 31.6  MCHC 33.6  RDW 14.3  PLT 292   Thyroid  No results for input(s): "TSH", "FREET4" in the last 168 hours.  BNPNo results for input(s): "BNP", "PROBNP" in the last 168 hours.  DDimer No results for input(s): "DDIMER" in the last 168 hours.   Radiology/Studies:  DG Ankle 2 Views Left Result Date: 01/22/2024 CLINICAL DATA:  Left ankle pain EXAM: LEFT ANKLE - 2 VIEW COMPARISON:  Foot radiographs 01/17/2024 FINDINGS: Distal fibular plate and screw fixator without complicating feature. Atheromatous vascular calcifications. Mild deformity and faint lucency along the medial malleolus compatible with old healed fracture. Plantar calcaneal spur. Degenerative findings in the midfoot and Lisfranc joint. No acute bony finding is observed on this two view series. IMPRESSION: 1. No acute findings. 2. Distal fibular plate and screw fixator without complicating feature. 3. Old healed fracture of the medial malleolus. 4. Degenerative findings in the midfoot and Lisfranc joint. 5. Plantar calcaneal spur. 6. Atheromatous vascular calcifications. Electronically Signed   By: Freida Jes M.D.   On: 01/22/2024 17:40   DG Chest Portable 1 View Result Date: 01/22/2024 CLINICAL DATA:  Weakness.  Left ankle pain. EXAM: PORTABLE CHEST 1 VIEW COMPARISON:  12/22/2023 FINDINGS: Right upper lobe scarring appear stable.  Prominent and asymmetric degenerative glenohumeral arthropathy on the right. Thoracic spondylosis. Heart size within normal limits for projection. No blunting of the costophrenic angles. IMPRESSION: 1. No acute findings. 2. Right upper lobe scarring. 3. Thoracic spondylosis. 4. Prominent and asymmetric degenerative glenohumeral arthropathy on the right. Electronically Signed   By: Freida Jes M.D.   On: 01/22/2024 17:37     Assessment and Plan:    1.Syncope - unclear etiology. She has chronic orthostatic hypotension and orthostatic dizziness. Earlier in the day before the episode she had what sounded like vagal symptoms, with nausea/strong urge to have a bowel movement, followed by fatigue after. Her first episode has a prodrome while standing in the grocery store parking lot, todays episode she denies any prior warning and occurred while sitting. She has a UTI that has been untreated that convolutes things as well.   - recent extensive cardiac workup.  - recent echo with just low normal to mildly reduced LVEF, no structural findings that would cause syncope - recent cath with normal coronaries - prior monitor short runs of NSVT and SVT on monitor in 2024. Currently has on a repeat 30 day monitor - after fluids in ER, remains orthostatic. Sitting SBP 178, standing 3 minutes down to 152. DBP drops 10 points sitting to standing. Give additional 250mL of NS, careful fluids due to prior issues with HF. Follow telemetry overnight. She will need to mail her current monitor in to get results.     2. HFmrEF - 12/2023 echo: LVE 45-50%, no WMAs, grade I dd. Normal RV function. Mild MR 12/2023 cath: normal coronaries, LVEDP 27-30 - lasix  was changed to prn during 01/18/24 visit. Has been on and off per family report based on different MD evaluatoins   - hold diuretic today, appears hypovolemic. Careful with IVFs, from 12/2023 cath LVEDP 27-30 - with recurrent UTIs and orthostatic issues would d/c her  SGLT2i. Repeat orthostatics in AM and assess symptoms, may need to wean other HF meds as well. Im not clear on the indication for imdur , may consider stopping.    3.PAF - per clinic notes, mention not well documented and has been on eliquis  primarily for protein S deficiency  4. Frequent UTIs - abx this admit per primary team - unclear how much may have played a role in these episodes.    For questions or updates, please contact [facility] Please consult [email or web address] for contact info under    Signed, Armida Lander, MD  01/22/2024 7:29 PM

## 2024-01-22 NOTE — ED Triage Notes (Signed)
 Pt bib [facility] coming from doctor's office as she had a syncopal episode x80mins with 89/42 bp. On EMS arrival, pt more alert and bp 104/52. Orthostatic 94/42. Pt reports weakness x1wk to EMS. Alert and oriented x4. Pt reports chest pain at doctors office but denies with EMS. Pt has hx of diabetes.   EMS vitals: 120/40 66 HR Sinus 18 RR 98% RA 108cbg  20 RAC

## 2024-01-22 NOTE — Telephone Encounter (Signed)
 Called and left patient detailed message to call office back with questions.

## 2024-01-22 NOTE — Telephone Encounter (Signed)
  Chief Complaint: near faint episode- last Wednesday Symptoms: patient was out shopping and had near syncope episode- she is wear a heart monitor. She was loading car and everything went black- she could hear but not respond- another shopper helped her and got her into her car- she states it took 15- 30 minutes before she recovered enough to drive herself home. She states she was seen at ED and diagnosis with foot sprain and UTI.  Frequency: this has happened several times- not this bad Pertinent Negatives: Patient denies symptoms now Disposition: [] ED /[] Urgent Care (no appt availability in office) / [x] Appointment(In office/virtual)/ []  [facility]/ [] Home Care/ [] Refused Recommended Disposition /[]  Mobile Bus/ []  Follow-up with PCP Additional Notes: Patient wants to make sure she is on correct antibiotic- she also wants to discuss her fainting.    Reason for Disposition  [1] All other patients AND [2] now alert and feels fine  (Exception: SIMPLE FAINT due to stress, pain, prolonged standing, or suddenly standing)  Answer Assessment - Initial Assessment Questions 1. ONSET: "How long were you unconscious?" (minutes) "When did it happen?"     Patient was conscious of everything around her- but everything was black 2. CONTENT: "What happened during period of unconsciousness?" (e.g., seizure activity)      Patient has been shopping and was load the car- patient felt faint and went down- patient injured her ankle.  3. MENTAL STATUS: "Alert and oriented now?" (oriented x 3 = name, month, location)      Patient states it took her 15 minutes to get fully conscious  4. TRIGGER: "What do you think caused the fainting?" "What were you doing just before you fainted?"  (e.g., exercise, sudden standing up, prolonged standing)     Not sure 5. RECURRENT SYMPTOM: "Have you ever passed out before?" If Yes, ask: "When was the last time?" and "What happened that time?"      Possible UTI-  culture abnormal  6. INJURY: "Did you sustain any injury during the fall?"      Ankle injury 7. CARDIAC SYMPTOMS: "Have you had any of the following symptoms: chest pain, difficulty breathing, palpitations?"     A fib hx- cardiac cath showed Heart failure on left  Protocols used: Fainting-A-AH

## 2024-01-22 NOTE — Assessment & Plan Note (Signed)
 Chest reported as 2/10 in the office. Pain of 4/10 @ home.  EKG completed - NSR with partial RBBB, ventricular rate - 64, PR -142, QRS - 100, QTc - 435, No ST elevation or depression - correctly wearing a Zio patch and has been feeling bad since Wednesday

## 2024-01-23 ENCOUNTER — Encounter (HOSPITAL_COMMUNITY): Payer: Self-pay | Admitting: Internal Medicine

## 2024-01-23 DIAGNOSIS — N1832 Chronic kidney disease, stage 3b: Secondary | ICD-10-CM

## 2024-01-23 DIAGNOSIS — N390 Urinary tract infection, site not specified: Secondary | ICD-10-CM

## 2024-01-23 DIAGNOSIS — I480 Paroxysmal atrial fibrillation: Secondary | ICD-10-CM

## 2024-01-23 DIAGNOSIS — I10 Essential (primary) hypertension: Secondary | ICD-10-CM

## 2024-01-23 DIAGNOSIS — I428 Other cardiomyopathies: Secondary | ICD-10-CM | POA: Diagnosis not present

## 2024-01-23 DIAGNOSIS — D6859 Other primary thrombophilia: Secondary | ICD-10-CM

## 2024-01-23 DIAGNOSIS — R55 Syncope and collapse: Secondary | ICD-10-CM | POA: Diagnosis not present

## 2024-01-23 DIAGNOSIS — Z794 Long term (current) use of insulin: Secondary | ICD-10-CM | POA: Diagnosis not present

## 2024-01-23 DIAGNOSIS — I5020 Unspecified systolic (congestive) heart failure: Secondary | ICD-10-CM | POA: Diagnosis not present

## 2024-01-23 DIAGNOSIS — E1140 Type 2 diabetes mellitus with diabetic neuropathy, unspecified: Secondary | ICD-10-CM | POA: Diagnosis not present

## 2024-01-23 HISTORY — DX: Urinary tract infection, site not specified: N39.0

## 2024-01-23 LAB — TSH: TSH: 2.012 u[IU]/mL (ref 0.350–4.500)

## 2024-01-23 LAB — COMPREHENSIVE METABOLIC PANEL WITH GFR
ALT: 23 U/L (ref 0–44)
AST: 24 U/L (ref 15–41)
Albumin: 3.1 g/dL — ABNORMAL LOW (ref 3.5–5.0)
Alkaline Phosphatase: 81 U/L (ref 38–126)
Anion gap: 8 (ref 5–15)
BUN: 31 mg/dL — ABNORMAL HIGH (ref 8–23)
CO2: 25 mmol/L (ref 22–32)
Calcium: 9 mg/dL (ref 8.9–10.3)
Chloride: 102 mmol/L (ref 98–111)
Creatinine, Ser: 1.27 mg/dL — ABNORMAL HIGH (ref 0.44–1.00)
GFR, Estimated: 43 mL/min — ABNORMAL LOW (ref >60)
Glucose, Bld: 239 mg/dL — ABNORMAL HIGH (ref 70–99)
Potassium: 3.9 mmol/L (ref 3.5–5.1)
Sodium: 135 mmol/L (ref 135–145)
Total Bilirubin: 0.8 mg/dL (ref 0.0–1.2)
Total Protein: 6.1 g/dL — ABNORMAL LOW (ref 6.5–8.1)

## 2024-01-23 LAB — CBC WITH DIFFERENTIAL/PLATELET
Abs Immature Granulocytes: 0.02 10*3/uL (ref 0.00–0.07)
Basophils Absolute: 0 10*3/uL (ref 0.0–0.1)
Basophils Relative: 0 %
Eosinophils Absolute: 0.1 10*3/uL (ref 0.0–0.5)
Eosinophils Relative: 1 %
HCT: 34.9 % — ABNORMAL LOW (ref 36.0–46.0)
Hemoglobin: 11.7 g/dL — ABNORMAL LOW (ref 12.0–15.0)
Immature Granulocytes: 0 %
Lymphocytes Relative: 15 %
Lymphs Abs: 1.1 10*3/uL (ref 0.7–4.0)
MCH: 31.3 pg (ref 26.0–34.0)
MCHC: 33.5 g/dL (ref 30.0–36.0)
MCV: 93.3 fL (ref 80.0–100.0)
Monocytes Absolute: 0.8 10*3/uL (ref 0.1–1.0)
Monocytes Relative: 11 %
Neutro Abs: 5.3 10*3/uL (ref 1.7–7.7)
Neutrophils Relative %: 73 %
Platelets: 261 10*3/uL (ref 150–400)
RBC: 3.74 MIL/uL — ABNORMAL LOW (ref 3.87–5.11)
RDW: 14.2 % (ref 11.5–15.5)
WBC: 7.3 10*3/uL (ref 4.0–10.5)
nRBC: 0 % (ref 0.0–0.2)

## 2024-01-23 LAB — MAGNESIUM: Magnesium: 1.8 mg/dL (ref 1.7–2.4)

## 2024-01-23 LAB — CORTISOL: Cortisol, Plasma: 6.4 ug/dL

## 2024-01-23 LAB — GLUCOSE, CAPILLARY
Glucose-Capillary: 144 mg/dL — ABNORMAL HIGH (ref 70–99)
Glucose-Capillary: 167 mg/dL — ABNORMAL HIGH (ref 70–99)
Glucose-Capillary: 184 mg/dL — ABNORMAL HIGH (ref 70–99)

## 2024-01-23 LAB — CBG MONITORING, ED
Glucose-Capillary: 174 mg/dL — ABNORMAL HIGH (ref 70–99)
Glucose-Capillary: 197 mg/dL — ABNORMAL HIGH (ref 70–99)
Glucose-Capillary: 203 mg/dL — ABNORMAL HIGH (ref 70–99)

## 2024-01-23 MED ORDER — PANTOPRAZOLE SODIUM 40 MG PO TBEC
40.0000 mg | DELAYED_RELEASE_TABLET | Freq: Every day | ORAL | Status: DC
  Administered 2024-01-23 – 2024-01-26 (×4): 40 mg via ORAL
  Filled 2024-01-23 (×4): qty 1

## 2024-01-23 MED ORDER — CARVEDILOL 12.5 MG PO TABS
12.5000 mg | ORAL_TABLET | Freq: Two times a day (BID) | ORAL | Status: DC
  Administered 2024-01-23 – 2024-01-26 (×7): 12.5 mg via ORAL
  Filled 2024-01-23 (×7): qty 1

## 2024-01-23 MED ORDER — INSULIN ASPART 100 UNIT/ML IJ SOLN
0.0000 [IU] | Freq: Three times a day (TID) | INTRAMUSCULAR | Status: DC
  Administered 2024-01-23 (×2): 1 [IU] via SUBCUTANEOUS
  Administered 2024-01-23: 2 [IU] via SUBCUTANEOUS
  Administered 2024-01-24 (×2): 1 [IU] via SUBCUTANEOUS
  Administered 2024-01-24: 2 [IU] via SUBCUTANEOUS

## 2024-01-23 MED ORDER — INSULIN ASPART 100 UNIT/ML IJ SOLN
6.0000 [IU] | Freq: Three times a day (TID) | INTRAMUSCULAR | Status: DC
  Administered 2024-01-23 – 2024-01-26 (×10): 6 [IU] via SUBCUTANEOUS
  Filled 2024-01-23: qty 0.06

## 2024-01-23 MED ORDER — MIRABEGRON ER 25 MG PO TB24
25.0000 mg | ORAL_TABLET | Freq: Every day | ORAL | Status: DC
  Administered 2024-01-23 – 2024-01-26 (×4): 25 mg via ORAL
  Filled 2024-01-23 (×4): qty 1

## 2024-01-23 MED ORDER — APIXABAN 5 MG PO TABS
5.0000 mg | ORAL_TABLET | Freq: Two times a day (BID) | ORAL | Status: DC
  Administered 2024-01-23 – 2024-01-26 (×8): 5 mg via ORAL
  Filled 2024-01-23 (×8): qty 1

## 2024-01-23 MED ORDER — MAGNESIUM SULFATE 2 GM/50ML IV SOLN
2.0000 g | Freq: Once | INTRAVENOUS | Status: AC
  Administered 2024-01-23: 2 g via INTRAVENOUS
  Filled 2024-01-23: qty 50

## 2024-01-23 MED ORDER — ATORVASTATIN CALCIUM 80 MG PO TABS
80.0000 mg | ORAL_TABLET | Freq: Every day | ORAL | Status: DC
  Administered 2024-01-23 – 2024-01-26 (×4): 80 mg via ORAL
  Filled 2024-01-23 (×4): qty 1

## 2024-01-23 MED ORDER — ACETAMINOPHEN 325 MG PO TABS
650.0000 mg | ORAL_TABLET | Freq: Four times a day (QID) | ORAL | Status: DC | PRN
  Administered 2024-01-24 – 2024-01-25 (×2): 650 mg via ORAL
  Filled 2024-01-23 (×3): qty 2

## 2024-01-23 MED ORDER — FERROUS SULFATE 325 (65 FE) MG PO TABS
325.0000 mg | ORAL_TABLET | Freq: Every day | ORAL | Status: DC
  Administered 2024-01-23 – 2024-01-25 (×3): 325 mg via ORAL
  Filled 2024-01-23 (×3): qty 1

## 2024-01-23 MED ORDER — VITAMIN B-6 100 MG PO TABS
100.0000 mg | ORAL_TABLET | Freq: Every day | ORAL | Status: DC
  Administered 2024-01-24 – 2024-01-25 (×2): 100 mg via ORAL
  Filled 2024-01-23 (×4): qty 1

## 2024-01-23 MED ORDER — VITAMIN B-12 1000 MCG PO TABS
1000.0000 ug | ORAL_TABLET | Freq: Every day | ORAL | Status: DC
  Administered 2024-01-23 – 2024-01-25 (×3): 1000 ug via ORAL
  Filled 2024-01-23 (×3): qty 1

## 2024-01-23 NOTE — Progress Notes (Signed)
 Rounding Note    Patient Name: Pamela [name] Date of Encounter: 01/23/2024  [facility] Cardiologist: Kardie Tobb, DO   Subjective   BP 177/76. Cr stable at 1.27.  Denies any chest pain or dyspnea  Inpatient Medications    Scheduled Meds:  amoxicillin -clavulanate  1 tablet Oral Q12H   apixaban   5 mg Oral BID   atorvastatin   80 mg Oral Daily   carvedilol   12.5 mg Oral BID WC   cyanocobalamin   1,000 mcg Oral Q lunch   ferrous sulfate   325 mg Oral Q lunch   insulin  aspart  0-6 Units Subcutaneous TID WC   insulin  aspart  6 Units Subcutaneous TID WC   mirabegron  ER  25 mg Oral Daily   pantoprazole   40 mg Oral Daily   pyridoxine  100 mg Oral Q lunch   Continuous Infusions:  PRN Meds: acetaminophen    Vital Signs    Vitals:   01/23/24 0615 01/23/24 0630 01/23/24 0900 01/23/24 0907  BP:   (!) 177/76   Pulse: 79 84 80   Resp: (!) 28 (!) 21 20   Temp:    98.2 F (36.8 C)  TempSrc:    Oral  SpO2: 92% 93% 95%    No intake or output data in the 24 hours ending 01/23/24 1027    01/22/2024    2:04 PM 01/18/2024    1:54 PM 12/20/2023    2:07 PM  Last 3 Weights  Weight (lbs) 138 lb 6.4 oz 148 lb 140 lb  Weight (kg) 62.778 kg 67.132 kg 63.504 kg      Telemetry    NSR - Personally Reviewed  ECG    No new ECG - Personally Reviewed  Physical Exam   GEN: No acute distress.   Neck: No JVD Cardiac: RRR, no murmurs, rubs, or gallops.  Respiratory: Clear to auscultation bilaterally. GI: Soft, nontender, non-distended  MS: No edema; No deformity. Neuro:  Nonfocal  Psych: Normal affect   Labs    High Sensitivity Troponin:   Recent Labs  Lab 01/22/24 1618 01/22/24 1805  TROPONINIHS 12 10     Chemistry Recent Labs  Lab 01/22/24 1618 01/22/24 1805 01/23/24 0502  NA 136  --  135  K 4.4  --  3.9  CL 99  --  102  CO2 27  --  25  GLUCOSE 170*  --  239*  BUN 33*  --  31*  CREATININE 1.24*  --  1.27*  CALCIUM  9.0  --  9.0  MG  --   1.8 1.8  PROT 6.4*  --  6.1*  ALBUMIN  3.5  --  3.1*  AST 29  --  24  ALT 25  --  23  ALKPHOS 80  --  81  BILITOT 1.0  --  0.8  GFRNONAA 44*  --  43*  ANIONGAP 10  --  8    Lipids No results for input(s): "CHOL", "TRIG", "HDL", "LABVLDL", "LDLCALC", "CHOLHDL" in the last 168 hours.  Hematology Recent Labs  Lab 01/22/24 1618 01/23/24 0502  WBC 8.3 7.3  RBC 3.86* 3.74*  HGB 12.2 11.7*  HCT 36.3 34.9*  MCV 94.0 93.3  MCH 31.6 31.3  MCHC 33.6 33.5  RDW 14.3 14.2  PLT 292 261   Thyroid   Recent Labs  Lab 01/23/24 0502  TSH 2.012    BNPNo results for input(s): "BNP", "PROBNP" in the last 168 hours.  DDimer No results for input(s): "DDIMER" in  the last 168 hours.   Radiology    DG Ankle 2 Views Left Result Date: 01/22/2024 CLINICAL DATA:  Left ankle pain EXAM: LEFT ANKLE - 2 VIEW COMPARISON:  Foot radiographs 01/17/2024 FINDINGS: Distal fibular plate and screw fixator without complicating feature. Atheromatous vascular calcifications. Mild deformity and faint lucency along the medial malleolus compatible with old healed fracture. Plantar calcaneal spur. Degenerative findings in the midfoot and Lisfranc joint. No acute bony finding is observed on this two view series. IMPRESSION: 1. No acute findings. 2. Distal fibular plate and screw fixator without complicating feature. 3. Old healed fracture of the medial malleolus. 4. Degenerative findings in the midfoot and Lisfranc joint. 5. Plantar calcaneal spur. 6. Atheromatous vascular calcifications. Electronically Signed   By: Freida Jes M.D.   On: 01/22/2024 17:40   DG Chest Portable 1 View Result Date: 01/22/2024 CLINICAL DATA:  Weakness.  Left ankle pain. EXAM: PORTABLE CHEST 1 VIEW COMPARISON:  12/22/2023 FINDINGS: Right upper lobe scarring appear stable. Prominent and asymmetric degenerative glenohumeral arthropathy on the right. Thoracic spondylosis. Heart size within normal limits for projection. No blunting of the  costophrenic angles. IMPRESSION: 1. No acute findings. 2. Right upper lobe scarring. 3. Thoracic spondylosis. 4. Prominent and asymmetric degenerative glenohumeral arthropathy on the right. Electronically Signed   By: Freida Jes M.D.   On: 01/22/2024 17:37    Cardiac Studies     Patient Profile     78 y.o. female with a hx of chronic HFmrEF, PAF, HTN, orthostatic hypotension, HLD, prior TIA, DM2, CKD 3 protein S deficiency who is being seen 01/22/2024 for the evaluation of syncope   Assessment & Plan    1.Syncope - unclear etiology. She has chronic orthostatic hypotension and orthostatic dizziness. Earlier in the day before the episode she had what sounded like vagal symptoms, with nausea/strong urge to have a bowel movement, followed by fatigue after. Her first episode has a prodrome while standing in the grocery store parking lot, episode on 5/19 she denies any prior warning and occurred while sitting. She has a UTI that has been untreated that convolutes things as well.  - recent extensive cardiac workup.  - recent echo with just low normal to mildly reduced LVEF, no structural findings that would cause syncope - recent cath with normal coronaries - prior monitor short runs of NSVT and SVT on monitor in 2024. Currently has on a repeat 30 day monitor - after fluids in ER, remains orthostatic. Sitting SBP 178, standing 3 minutes down to 152. DBP drops 10 points sitting to standing. Given additional 250mL of NS, careful fluids due to prior issues with HF. Repeat orthostatics today -She will need to mail her current monitor in to get results.       2. HFmrEF - 12/2023 echo: LVE 45-50%, no WMAs, grade I dd. Normal RV function. Mild MR 12/2023 cath: normal coronaries, LVEDP 27-30 - lasix  was changed to prn during 01/18/24 visit but reports was still taking. Has been on and off per family report based on different MD evaluatoins - hold diuretic today, appears hypovolemic. Careful with  IVFs, from 12/2023 cath LVEDP 27-30 - with recurrent UTIs and orthostatic issues would d/c her SGLT2i. Repeat orthostatics today and assess symptoms, may need to wean other HF meds as well. Im not clear on the indication for imdur , may consider stopping.      3.PAF - per clinic notes, mention not well documented and has been on eliquis  primarily for protein S deficiency  4. Frequent UTIs - abx this admit per primary team - unclear how much may have played a role in these episodes.  For questions or updates, please contact [facility] Please consult [email or web address] for contact info under        Signed, Wendie Hamburg, MD  01/23/2024, 10:27 AM

## 2024-01-23 NOTE — Assessment & Plan Note (Addendum)
 Mild reduced LV systolic function.   Plan to continue to hold on diuretic therapy  Continue with carvedilol .  Continue close follow up on blood pressure

## 2024-01-23 NOTE — Assessment & Plan Note (Addendum)
 Continue blood pressure monitoring  Continue carvedilol  for blood pressure control.

## 2024-01-23 NOTE — ED Notes (Signed)
 This RN asked Dr.Kakrakandy if he wanted the insulin  now or in the AM, also notified that the pt has a headache.

## 2024-01-23 NOTE — Hospital Course (Addendum)
 Pamela [name] was admitted to the hospital with the working diagnosis of syncope.   78 yo female with the past medical history of heart failure, orthostatic hypotension, CKD, T2DM, paroxysmal atrial fibrillation and protein S deficiency who presented with syncope episode  Recent ED visit for syncope 05/14, she was instructed to hold on furosemide  and SG:T 2 inh and follow up with Cardiology as outpatient. She follow up with cardiology and ambulatory heart monitor was placed. On the day of admission she had another syncope at her primary care office. Note that she continue taking diuretic therapy furosemide  and SGLT 2 inh.  On her initial physical examination her blood pressure was 183/73, HR 82, RR 20 and 02 saturation 98%. Lungs with no wheezing or rhonchi, heart with S1 and S2 present and regular, abdomen with no distention and no lower extremity edema.   Na 136, K 4.4 Cl 99 bicarbonate 27 glucose 170, bun 33 cr 1,24  AST 29 ALT 25  High sensitive troponin 12 and 10  Wbc 8,3 hgb 12.2 plt 292   Urine analysis SG 1,010, protein negative, positive leukocytes negative hgb, glucose > 500, wbc > 50 RBC 11-20   Left ankle radiograph with no acute findings, distal fibular plate and screw fixation without complicating feature.  Old healed fracture of the medial malleolus.  Plantar calcaneal spur.   Chest radiograph with mild cardiomegaly with no effusions or infiltrates.   EKG 64 bpm, normal axis, normal intervals, qtc 435, sinus rhythm with no significant ST segment or T wave changes,

## 2024-01-23 NOTE — Care Management Obs Status (Signed)
 MEDICARE OBSERVATION STATUS NOTIFICATION   Patient Details  Name: Pamela [name] MRN: [id number] Date of Birth: 1945-12-29   Medicare Observation Status Notification Given:  Yes  Moon/Obs  letter sign and copy given  Wynonia Hedges 01/23/2024, 1:51 PM

## 2024-01-23 NOTE — Progress Notes (Signed)
 Progress Note   Patient: Pamela [name] WGN:[id number] DOB: Mar 15, 1946 DOA: 01/22/2024     0 DOS: the patient was seen and examined on 01/23/2024   Brief hospital course: Pamela [name] was admitted to the hospital with the working diagnosis of syncope.   78 yo female with the past medical history of heart failure, orthostatic hypotension, CKD, T2DM, paroxysmal atrial fibrillation and protein S deficiency who presented with syncope episode  Recent ED visit for syncope 05/14, she was instructed to hold on furosemide  and SG:T 2 inh and follow up with Cardiology as outpatient. She follow up with cardiology and ambulatory heart monitor was placed. On the day of admission she had another syncope at her primary care office. Note that she continue taking diuretic therapy furosemide  and SGLT 2 inh.  On her initial physical examination her blood pressure was 183/73, HR 82, RR 20 and 02 saturation 98%. Lungs with no wheezing or rhonchi, heart with S1 and S2 present and regular, abdomen with no distention and no lower extremity edema.   EKG 64 bpm, normal axis, normal intervals, qtc 435, sinus rhythm with no significant ST segment or T wave changes,    Assessment and Plan: * Syncope Multifactorial possible orthostatic vagal related.   Patient had IV fluids.  Will continue telemetry monitoring.  Will continue close follow up blood pressure.  Follow up with ambulatory cardiac monitoring   NICM (nonischemic cardiomyopathy) (HCC) Mild reduced LV systolic function.   Plan to continue to hold on diuretic therapy  Continue with carvedilol .  Continue close follow up on blood pressure   Stage 3b chronic kidney disease (HCC) Renal function with serum cr at 1,27 with K at 3,9 and serum bicarbonate at 25  Na 135 and Mg 1,8   Plan to continue close monitoring renal function and electrolytes.  Will add 2 g mag sulfate  Hold on IV fluids   Protein S deficiency (HCC) Continue  anticoagulation  Uncontrolled hypertension Continue blood pressure monitoring  Continue carvedilol  for blood pressure control.   Type 2 diabetes mellitus with diabetic neuropathy, with long-term current use of insulin  (HCC) Continue glucose cover and monitoring with insulin  sliding scale.   Paroxysmal atrial fibrillation (HCC) Patient on sinus rhythm, continue anticoagulation with apixaban     UTI (urinary tract infection) Continue antibiotic therapy with Augmentin .   Follow up on cultures.         Subjective: Patient with no chest pain and no dyspnea, left ankle in a boot with controlled pain   Physical Exam: Vitals:   01/23/24 1224 01/23/24 1500 01/23/24 1505 01/23/24 1530  BP: (!) 168/59 (!) 187/56 (!) 187/56 (!) 152/55  Pulse: 62 66 70 63  Resp: 18 19 19 19   Temp: 98.2 F (36.8 C) 99.1 F (37.3 C) (!) 97.4 F (36.3 C) 97.6 F (36.4 C)  TempSrc: Oral Oral Oral Oral  SpO2: 95%   95%  Weight:   59 kg   Height:   5' (1.524 m)    Neurology awake and alert ENT with mild pallor with no icterus Cardiovascular with S1 and S2 present and regular with no gallops, rubs or murmurs No JVD No lower extremity edema, left leg with boot in place Respiratory with no rales or wheezing, no rhonchi  Abdomen with no distention   Data Reviewed:    Family Communication: I spoke with patient's daughter at the bedside, we talked in detail about patient's condition, plan of care and prognosis and all questions were addressed.  Disposition: Status is: Observation The patient remains OBS appropriate and will d/c before 2 midnights.  Planned Discharge Destination: Home     Author: Albertus Alt, MD 01/23/2024 4:43 PM  For on call review [email or web address].

## 2024-01-23 NOTE — Assessment & Plan Note (Signed)
 Patient on sinus rhythm, continue anticoagulation with apixaban 

## 2024-01-23 NOTE — Assessment & Plan Note (Signed)
>>  ASSESSMENT AND PLAN FOR HTN (HYPERTENSION) WRITTEN ON 01/23/2024  4:57 PM BY ARRIEN, MAURICIO DANIEL, MD  Continue blood pressure monitoring  Continue carvedilol  for blood pressure control.

## 2024-01-23 NOTE — Assessment & Plan Note (Signed)
 Continue anticoagulation

## 2024-01-23 NOTE — Assessment & Plan Note (Addendum)
 Continue antibiotic therapy with Augmentin .   Follow up on cultures.

## 2024-01-23 NOTE — Assessment & Plan Note (Signed)
 Multifactorial possible orthostatic vagal related.   Patient had IV fluids.  Will continue telemetry monitoring.  Will continue close follow up blood pressure.  Follow up with ambulatory cardiac monitoring

## 2024-01-23 NOTE — Assessment & Plan Note (Signed)
 Renal function with serum cr at 1,27 with K at 3,9 and serum bicarbonate at 25  Na 135 and Mg 1,8   Plan to continue close monitoring renal function and electrolytes.  Will add 2 g mag sulfate  Hold on IV fluids

## 2024-01-23 NOTE — TOC CM/SW Note (Signed)
 Transition of Care [facility]) - Inpatient Brief Assessment   Patient Details  Name: Melodye Swor MRN: [id number] Date of Birth: 10-01-1945  [facility] CM/SW Contact:    Jennett Model, RN Phone Number: 01/23/2024, 4:16 PM   Clinical Narrative: From home with spouse, has PCP and insurance on file, states she is active with  [facility] services with [facility] for HHPT and would like to  continue with them,has rollator, cane and a w/chair  at home.  States spouse will transport them home at [employer] and he is support system, states gets medications from [facility] on [address] in [place].  Pta self ambulatory with rollator/cane. Patient gives this NCM permission to speak with spouse.    Transition of Care Asessment: Insurance and Status: Insurance coverage has been reviewed Patient has primary care physician: Yes Home environment has been reviewed: lives with spouse Prior level of function:: ambulatory with cane, rollator Prior/Current Home Services: Current home services (active with [facility] for HHPT) Social Drivers of Health Review: SDOH reviewed no interventions necessary Readmission risk has been reviewed: Yes Transition of care needs: transition of care needs identified, TOC will continue to follow

## 2024-01-23 NOTE — Assessment & Plan Note (Signed)
Last A1C 11/20/20 6.2%  Plan A1C  Sliding scale coverage  Farixga 10  mg daily 

## 2024-01-24 ENCOUNTER — Observation Stay (HOSPITAL_COMMUNITY)

## 2024-01-24 DIAGNOSIS — Z90710 Acquired absence of both cervix and uterus: Secondary | ICD-10-CM | POA: Diagnosis not present

## 2024-01-24 DIAGNOSIS — I428 Other cardiomyopathies: Secondary | ICD-10-CM | POA: Diagnosis not present

## 2024-01-24 DIAGNOSIS — R55 Syncope and collapse: Secondary | ICD-10-CM | POA: Diagnosis not present

## 2024-01-24 DIAGNOSIS — R109 Unspecified abdominal pain: Secondary | ICD-10-CM | POA: Diagnosis not present

## 2024-01-24 DIAGNOSIS — Z9049 Acquired absence of other specified parts of digestive tract: Secondary | ICD-10-CM | POA: Diagnosis not present

## 2024-01-24 DIAGNOSIS — I5020 Unspecified systolic (congestive) heart failure: Secondary | ICD-10-CM | POA: Diagnosis not present

## 2024-01-24 LAB — BASIC METABOLIC PANEL WITH GFR
Anion gap: 10 (ref 5–15)
BUN: 34 mg/dL — ABNORMAL HIGH (ref 8–23)
CO2: 25 mmol/L (ref 22–32)
Calcium: 9.1 mg/dL (ref 8.9–10.3)
Chloride: 99 mmol/L (ref 98–111)
Creatinine, Ser: 1.21 mg/dL — ABNORMAL HIGH (ref 0.44–1.00)
GFR, Estimated: 46 mL/min — ABNORMAL LOW (ref >60)
Glucose, Bld: 225 mg/dL — ABNORMAL HIGH (ref 70–99)
Potassium: 4.5 mmol/L (ref 3.5–5.1)
Sodium: 134 mmol/L — ABNORMAL LOW (ref 135–145)

## 2024-01-24 LAB — GLUCOSE, CAPILLARY
Glucose-Capillary: 192 mg/dL — ABNORMAL HIGH (ref 70–99)
Glucose-Capillary: 197 mg/dL — ABNORMAL HIGH (ref 70–99)
Glucose-Capillary: 243 mg/dL — ABNORMAL HIGH (ref 70–99)
Glucose-Capillary: 306 mg/dL — ABNORMAL HIGH (ref 70–99)

## 2024-01-24 LAB — MAGNESIUM: Magnesium: 2.3 mg/dL (ref 1.7–2.4)

## 2024-01-24 MED ORDER — HYDRALAZINE HCL 20 MG/ML IJ SOLN
5.0000 mg | INTRAMUSCULAR | Status: DC | PRN
  Administered 2024-01-24: 5 mg via INTRAVENOUS
  Filled 2024-01-24: qty 1

## 2024-01-24 MED ORDER — LISINOPRIL 10 MG PO TABS
10.0000 mg | ORAL_TABLET | Freq: Every day | ORAL | Status: DC
  Administered 2024-01-24 – 2024-01-26 (×3): 10 mg via ORAL
  Filled 2024-01-24 (×3): qty 1

## 2024-01-24 MED ORDER — INSULIN ASPART 100 UNIT/ML IJ SOLN
0.0000 [IU] | Freq: Three times a day (TID) | INTRAMUSCULAR | Status: DC
  Administered 2024-01-24: 7 [IU] via SUBCUTANEOUS
  Administered 2024-01-25: 2 [IU] via SUBCUTANEOUS
  Administered 2024-01-25: 1 [IU] via SUBCUTANEOUS
  Administered 2024-01-25: 3 [IU] via SUBCUTANEOUS
  Administered 2024-01-25 – 2024-01-26 (×3): 1 [IU] via SUBCUTANEOUS
  Administered 2024-01-26: 2 [IU] via SUBCUTANEOUS

## 2024-01-24 MED ORDER — DULAGLUTIDE 0.75 MG/0.5ML ~~LOC~~ SOAJ
0.7500 mg | SUBCUTANEOUS | Status: DC
  Administered 2024-01-24: 0.75 mg via SUBCUTANEOUS
  Filled 2024-01-24 (×2): qty 0.5

## 2024-01-24 NOTE — Progress Notes (Signed)
 TRH night cross cover note:   I was notified by the patient's RN of the patient's elevated systolic blood pressures in the 180s, with corresponding heart rates in the 60s.  No report of any associated acute symptoms.  I subsequently added hydralazine  5 mg IV every 6 hours as needed for systolic blood pressure greater than 180 or diastolic blood pressure greater than 120 mmHg.      Camelia Cavalier, DO Hospitalist

## 2024-01-24 NOTE — Progress Notes (Signed)
 Rounding Note    Patient Name: Pamela [name] Date of Encounter: 01/24/2024  [facility] Cardiologist: Kardie Tobb, DO   Subjective   BP 157/57. Cr stable at 1.21.  Denies any chest pain or dyspnea.  Reports abdominal pain this morning  Inpatient Medications    Scheduled Meds:  amoxicillin -clavulanate  1 tablet Oral Q12H   apixaban   5 mg Oral BID   atorvastatin   80 mg Oral Daily   carvedilol   12.5 mg Oral BID WC   cyanocobalamin   1,000 mcg Oral Q lunch   ferrous sulfate   325 mg Oral Q lunch   insulin  aspart  0-6 Units Subcutaneous TID WC   insulin  aspart  6 Units Subcutaneous TID WC   mirabegron  ER  25 mg Oral Daily   pantoprazole   40 mg Oral Daily   pyridoxine  100 mg Oral Q lunch   Continuous Infusions:  PRN Meds: acetaminophen , hydrALAZINE    Vital Signs    Vitals:   01/24/24 0258 01/24/24 0505 01/24/24 0809 01/24/24 0858  BP: (!) 181/61 (!) 156/50 (!) 149/85 (!) 157/57  Pulse:  77 81 83  Resp:  18 19   Temp:  97.6 F (36.4 C) 97.7 F (36.5 C)   TempSrc:  Oral Oral   SpO2:  95% 97%   Weight:      Height:        Intake/Output Summary (Last 24 hours) at 01/24/2024 0955 Last data filed at 01/24/2024 0506 Gross per 24 hour  Intake 120 ml  Output 400 ml  Net -280 ml      01/24/2024    1:04 AM 01/23/2024    3:05 PM 01/22/2024    2:04 PM  Last 3 Weights  Weight (lbs) 137 lb 130 lb 138 lb 6.4 oz  Weight (kg) 62.143 kg 58.968 kg 62.778 kg      Telemetry    NSR - Personally Reviewed  ECG    No new ECG - Personally Reviewed  Physical Exam   GEN: No acute distress.   Neck: No JVD Cardiac: RRR, no murmurs, rubs, or gallops.  Respiratory: Clear to auscultation bilaterally. GI: Soft, nontender, non-distended  MS: No edema; No deformity. Neuro:  Nonfocal  Psych: Normal affect   Labs    High Sensitivity Troponin:   Recent Labs  Lab 01/22/24 1618 01/22/24 1805  TROPONINIHS 12 10     Chemistry Recent Labs  Lab  01/22/24 1618 01/22/24 1805 01/23/24 0502 01/24/24 0217  NA 136  --  135 134*  K 4.4  --  3.9 4.5  CL 99  --  102 99  CO2 27  --  25 25  GLUCOSE 170*  --  239* 225*  BUN 33*  --  31* 34*  CREATININE 1.24*  --  1.27* 1.21*  CALCIUM  9.0  --  9.0 9.1  MG  --  1.8 1.8 2.3  PROT 6.4*  --  6.1*  --   ALBUMIN  3.5  --  3.1*  --   AST 29  --  24  --   ALT 25  --  23  --   ALKPHOS 80  --  81  --   BILITOT 1.0  --  0.8  --   GFRNONAA 44*  --  43* 46*  ANIONGAP 10  --  8 10    Lipids No results for input(s): "CHOL", "TRIG", "HDL", "LABVLDL", "LDLCALC", "CHOLHDL" in the last 168 hours.  Hematology Recent Labs  Lab  01/22/24 1618 01/23/24 0502  WBC 8.3 7.3  RBC 3.86* 3.74*  HGB 12.2 11.7*  HCT 36.3 34.9*  MCV 94.0 93.3  MCH 31.6 31.3  MCHC 33.6 33.5  RDW 14.3 14.2  PLT 292 261   Thyroid   Recent Labs  Lab 01/23/24 0502  TSH 2.012    BNPNo results for input(s): "BNP", "PROBNP" in the last 168 hours.  DDimer No results for input(s): "DDIMER" in the last 168 hours.   Radiology    DG Ankle 2 Views Left Result Date: 01/22/2024 CLINICAL DATA:  Left ankle pain EXAM: LEFT ANKLE - 2 VIEW COMPARISON:  Foot radiographs 01/17/2024 FINDINGS: Distal fibular plate and screw fixator without complicating feature. Atheromatous vascular calcifications. Mild deformity and faint lucency along the medial malleolus compatible with old healed fracture. Plantar calcaneal spur. Degenerative findings in the midfoot and Lisfranc joint. No acute bony finding is observed on this two view series. IMPRESSION: 1. No acute findings. 2. Distal fibular plate and screw fixator without complicating feature. 3. Old healed fracture of the medial malleolus. 4. Degenerative findings in the midfoot and Lisfranc joint. 5. Plantar calcaneal spur. 6. Atheromatous vascular calcifications. Electronically Signed   By: Freida Jes M.D.   On: 01/22/2024 17:40   DG Chest Portable 1 View Result Date: 01/22/2024 CLINICAL  DATA:  Weakness.  Left ankle pain. EXAM: PORTABLE CHEST 1 VIEW COMPARISON:  12/22/2023 FINDINGS: Right upper lobe scarring appear stable. Prominent and asymmetric degenerative glenohumeral arthropathy on the right. Thoracic spondylosis. Heart size within normal limits for projection. No blunting of the costophrenic angles. IMPRESSION: 1. No acute findings. 2. Right upper lobe scarring. 3. Thoracic spondylosis. 4. Prominent and asymmetric degenerative glenohumeral arthropathy on the right. Electronically Signed   By: Freida Jes M.D.   On: 01/22/2024 17:37    Cardiac Studies     Patient Profile     78 y.o. female with a hx of chronic HFmrEF, PAF, HTN, orthostatic hypotension, HLD, prior TIA, DM2, CKD 3 protein S deficiency who is being seen 01/22/2024 for the evaluation of syncope   Assessment & Plan    1.Syncope - unclear etiology. She has chronic orthostatic hypotension and orthostatic dizziness. Earlier in the day before the episode she had what sounded like vagal symptoms, with nausea/strong urge to have a bowel movement, followed by fatigue after. Her first episode has a prodrome while standing in the grocery store parking lot, episode on 5/19 she denies any prior warning and occurred while sitting. She has a UTI that has been untreated that convolutes things as well.  - recent extensive cardiac workup.  - recent echo with just low normal to mildly reduced LVEF, no structural findings that would cause syncope - recent cath with normal coronaries - prior monitor short runs of NSVT and SVT on monitor in 2024. Currently has on a repeat 30 day monitor - Given fluids in ED, orthostatics yesterday showed resolution of orthostatic hypotension -She will need to mail her current monitor in to get results.    2. HFmrEF - 12/2023 echo: LVE 45-50%, no WMAs, grade I dd. Normal RV function. Mild MR 12/2023 cath: normal coronaries, LVEDP 27-30 - lasix  was changed to prn during 01/18/24 visit but  reports was still taking. Has been on and off per family report based on different MD evaluatoins - hold diuretic today, appears hypovolemic. Careful with IVFs, from 12/2023 cath LVEDP 27-30 - with recurrent UTIs and orthostatic issues would d/c her SGLT2i.   -Currently on coreg   12.5 mg twice daily.  Prior to admission was also on lisinopril  20 mg twice daily and Imdur  30 mg daily and Farxiga  10 mg daily and Lasix  20 mg daily.  Will add back lisinopril  at 10 mg daily.  Will plan to discharge on as needed Lasix    3.PAF - per clinic notes, not well documented and has been on eliquis  primarily for protein S deficiency   4. Frequent UTIs - abx this admit per primary team - unclear how much may have played a role in these episodes.  For questions or updates, please contact [facility] Please consult [email or web address] for contact info under        Signed, Wendie Hamburg, MD  01/24/2024, 9:55 AM

## 2024-01-24 NOTE — Evaluation (Addendum)
 Occupational Therapy Evaluation Patient Details Name: Pamela [name] MRN: [id number] DOB: 04-Mar-1946 Today's Date: 01/24/2024   History of Present Illness   78y.o. female was brought to the ER 5/19 from patient's PCP office after patient had brief episode of loss of consciousness. Was at the PCP for prior syncopal episode during which she injured her L ankle but x-rays did not show any fx. PMH of orthostatic hypotension, cardiomyopathy MD, GER, HTN, OA, Osteoporosis, CKD, DDD, Hand surgery, ORIF L ankle, TKA     Clinical Impressions Pt reports using cane/rollator for mobility and electric w/c when in stores, ind with ADLs.Pt live with spouse but daughter plans to be with her as long as needed at d/c and can assist. Pt currently needing up to min A for ADLs, mod I for bed mobility and supervision for transfers with rollator. Able to walk short hallway distance before fatiguing. Pt asymptomatic, but slightly orthostatic during session (see below). Pt presenting with impairments listed below, will follow acutely. Recommend HHOT at d/c.   BP supine 131/49 (70) BP seated 128/52 (72) BP standing 104/51 (68)     If plan is discharge home, recommend the following:   A little help with walking and/or transfers;A little help with bathing/dressing/bathroom;Assistance with cooking/housework;Direct supervision/assist for financial management;Direct supervision/assist for medications management;Assist for transportation;Help with stairs or ramp for entrance     Functional Status Assessment   Patient has had a recent decline in their functional status and demonstrates the ability to make significant improvements in function in a reasonable and predictable amount of time.     Equipment Recommendations   None recommended by OT (pt has all needed DME)     Recommendations for Other Services   PT consult     Precautions/Restrictions   Precautions Precautions:  Fall Precaution/Restrictions Comments: LLE boot Restrictions Weight Bearing Restrictions Per Provider Order: No     Mobility Bed Mobility Overal bed mobility: Modified Independent             General bed mobility comments: cues to scoot forward toward [facility]    Transfers Overall transfer level: Needs assistance Equipment used: Rollator (4 wheels) Transfers: Sit to/from Stand Sit to Stand: Supervision                  Balance Overall balance assessment: Mild deficits observed, not formally tested                                         ADL either performed or assessed with clinical judgement   ADL Overall ADL's : Needs assistance/impaired Eating/Feeding: Set up;Sitting   Grooming: Contact guard assist;Standing   Upper Body Bathing: Minimal assistance;Sitting   Lower Body Bathing: Minimal assistance;Sitting/lateral leans   Upper Body Dressing : Minimal assistance;Sitting   Lower Body Dressing: Minimal assistance;Sitting/lateral leans   Toilet Transfer: Contact guard assist;Ambulation;Rollator (4 wheels)   Toileting- Clothing Manipulation and Hygiene: Contact guard assist       Functional mobility during ADLs: Contact guard assist;Rollator (4 wheels)       Vision Baseline Vision/History: 1 Wears glasses Vision Assessment?: No apparent visual deficits     Perception         Praxis         Pertinent Vitals/Pain Pain Assessment Pain Assessment: Faces Pain Score: 6  Faces Pain Scale: Hurts even more Pain Location: L ankle with ambulation Pain Descriptors /  Indicators: Discomfort Pain Intervention(s): Limited activity within patient's tolerance, Monitored during session, Repositioned     Extremity/Trunk Assessment Upper Extremity Assessment Upper Extremity Assessment: Generalized weakness   Lower Extremity Assessment Lower Extremity Assessment: Defer to PT evaluation LLE Deficits / Details: L LE in walking boot, removed for  assessment, pt with pain in dorsum of foot with dorsiflexion, able to acheive neutral ankle positioning, strength grossly 3+/5   Cervical / Trunk Assessment Cervical / Trunk Assessment: Kyphotic   Communication Communication Communication: No apparent difficulties   Cognition Arousal: Alert Behavior During Therapy: WFL for tasks assessed/performed Cognition: No apparent impairments                               Following commands: Intact       Cueing  General Comments   Cueing Techniques: Verbal cues;Gestural cues  see note for BP measures   Exercises     Shoulder Instructions      Home Living Family/patient expects to be discharged to:: Private residence Living Arrangements: Spouse/significant other Available Help at Discharge: Home health;Available PRN/intermittently (daughter plans to stay with pt as long as needed at d/c.) Type of Home: House Home Access: Level entry     Home Layout: One level     Bathroom Shower/Tub: Producer, television/film/video: Handicapped height Bathroom Accessibility: Yes   Home Equipment: Agricultural consultant (2 wheels);Rollator (4 wheels);Cane - single point;Shower seat - built in;Grab bars - toilet;Grab bars - tub/shower;Hand held shower head;Electric scooter;Adaptive equipment Adaptive Equipment: Reacher        Prior Functioning/Environment Prior Level of Function : Independent/Modified Independent             Mobility Comments: ambulates with cane and Rollator in house, uses electric scooter at [employer] able to use cart for support at smaller stores ADLs Comments: independent in ADLs, cooking and laundry, now has someone to clean her house    OT Problem List: Decreased strength;Decreased activity tolerance;Decreased range of motion;Impaired balance (sitting and/or standing);Cardiopulmonary status limiting activity   OT Treatment/Interventions: Self-care/ADL training;Therapeutic exercise;Energy conservation;DME  and/or AE instruction;Therapeutic activities;Patient/family education;Balance training      OT Goals(Current goals can be found in the care plan section)   Acute Rehab OT Goals Patient Stated Goal: none stated OT Goal Formulation: With patient Time For Goal Achievement: 02/07/24 Potential to Achieve Goals: Good ADL Goals Pt Will Perform Upper Body Dressing: Independently;sitting Pt Will Perform Lower Body Dressing: Independently;sitting/lateral leans;sit to/from stand Pt Will Transfer to Toilet: Independently;ambulating;regular height toilet Pt Will Perform Tub/Shower Transfer: Tub transfer;Shower transfer;Independently;ambulating;shower seat   OT Frequency:  Min 2X/week    Co-evaluation              AM-PAC OT "6 Clicks" Daily Activity     Outcome Measure Help from another person eating meals?: A Little Help from another person taking care of personal grooming?: A Little Help from another person toileting, which includes using toliet, bedpan, or urinal?: A Little Help from another person bathing (including washing, rinsing, drying)?: A Little Help from another person to put on and taking off regular upper body clothing?: A Little Help from another person to put on and taking off regular lower body clothing?: A Little 6 Click Score: 18   End of Session Equipment Utilized During Treatment: Gait belt;Rollator (4 wheels) Nurse Communication: Mobility status  Activity Tolerance: Patient tolerated treatment well Patient left: in bed;with call bell/phone within reach;with bed  alarm set;with family/visitor present  OT Visit Diagnosis: Unsteadiness on feet (R26.81);Other abnormalities of gait and mobility (R26.89);Muscle weakness (generalized) (M62.81)                Time: 4098-1191 OT Time Calculation (min): 23 min Charges:  OT General Charges $OT Visit: 1 Visit OT Evaluation $OT Eval Low Complexity: 1 Low OT Treatments $Therapeutic Activity: 8-22 mins  Sanii Kukla K, OTD,  OTR/L SecureChat Preferred Acute Rehab [phone]   Antionette Kirks 01/24/2024, 4:57 PM

## 2024-01-24 NOTE — Evaluation (Signed)
 Physical Therapy Evaluation Patient Details Name: Pamela [name] MRN: [id number] DOB: 1946/07/23 Today's Date: 01/24/2024  History of Present Illness  78y.o. female was brought to the ER 5/19 from patient's PCP office after patient had brief episode of loss of consciousness. Was at the PCP for prior syncopal episode during which she injured her L ankle but x-rays did not show any fx. PMH of orthostatic hypotension, cardiomyopathy MD, GER, HTN, OA, Osteoporosis, CKD, DDD, Hand surgery, ORIF L ankle, TKA  Clinical Impression  PTA pt living with husband in single story home with level entry. Pt reports she was able to ambulate with cane or walker in her home, and uses her Scooter for big stores [employer]) and outings, and walker or shopping cart at smaller stores. Pt has been limited by L foot pain caused by syncopal fall, xrays cleared for fx, supposed to have seen the orthopedist outpatient yesterday. Pt has asymptomatic drop in BP with positional change (see General Comments). Pt is mod I for bed mobility and transfers, and is contact guard for ambulation (secondary to hx of syncope). PT recommending resumption of HHPT at discharge. PT will continue to follow acutely and will refer to Mobility Specialist        If plan is discharge home, recommend the following: A little help with walking and/or transfers;A little help with bathing/dressing/bathroom;Assistance with cooking/housework;Assist for transportation;Help with stairs or ramp for entrance   Can travel by private vehicle    Yes    Equipment Recommendations None recommended by PT     Functional Status Assessment Patient has had a recent decline in their functional status and demonstrates the ability to make significant improvements in function in a reasonable and predictable amount of time.     Precautions / Restrictions Precautions Precautions: Fall (syncope) Required Braces or Orthoses:  (walking boot on L  LE) Restrictions Weight Bearing Restrictions Per Provider Order: No      Mobility  Bed Mobility        Overall bed mobility: Modified Independent        General bed mobility comments: HoB elevated and use of bedrail but pt easily able to come to EoB    Transfers Overall transfer level: Modified independent                 General transfer comment: good power up and self steadying from EoB, uses grab bars in the bathroom to stand from lower toilet seat    Ambulation/Gait Ambulation/Gait assistance: Contact guard assist Gait Distance (Feet): 60 Feet Assistive device: Rolling walker (2 wheels) Gait Pattern/deviations: Step-through pattern, Decreased stance time - left, Decreased weight shift to left, Antalgic, Decreased step length - right, Decreased step length - left Gait velocity: slowed Gait velocity interpretation: <1.31 ft/sec, indicative of household ambulator   General Gait Details: contact guard for safety due to history of syncope, pt able to move well, does not shift onto L LE due to pain.        Balance Overall balance assessment: Mild deficits observed, not formally tested                                           Pertinent Vitals/Pain Pain Assessment Pain Assessment: 0-10 Pain Score: 6  Pain Location: L ankle with ambulation Pain Descriptors / Indicators: Sharp, Shooting Pain Intervention(s): Limited activity within patient's tolerance, Monitored during session, Repositioned  Home Living Family/patient expects to be discharged to:: Private residence Living Arrangements: Spouse/significant other Available Help at Discharge: Home health;Available PRN/intermittently ([employer] not able to provide physical assist, daughter available intermittently) Type of Home: House Home Access: Level entry       Home Layout: One level Home Equipment: Agricultural consultant (2 wheels);Rollator (4 wheels);Cane - single point;Shower seat - built  in;Grab bars - toilet;Grab bars - tub/shower;Hand held shower head;Electric scooter      Prior Function Prior Level of Function : Independent/Modified Independent             Mobility Comments: ambulates with cane and Rollator in house, uses electric scooter at [employer] able to use cart for support at smaller stores ADLs Comments: independent in ADLs, cooking and laundry, now has someone to clean her house     Extremity/Trunk Assessment   Upper Extremity Assessment Upper Extremity Assessment: Defer to OT evaluation    Lower Extremity Assessment Lower Extremity Assessment: LLE deficits/detail LLE Deficits / Details: L LE in walking boot, removed for assessment, pt with pain in dorsum of foot with dorsiflexion, able to acheive neutral ankle positioning, strength grossly 3+/5    Cervical / Trunk Assessment Cervical / Trunk Assessment: Kyphotic  Communication   Communication Communication: No apparent difficulties    Cognition Arousal: Alert Behavior During Therapy: WFL for tasks assessed/performed   PT - Cognitive impairments: No apparent impairments                         Following commands: Intact       Cueing Cueing Techniques: Verbal cues, Gestural cues     General Comments General comments (skin integrity, edema, etc.): BP in sitting 131/51, HR 68 bpm, standing 98/45, HR 68, standing 3 min 105/48 HR 69, max HR with ambulation 92bpm, BP with return to supine 138/59 HR 78        Assessment/Plan    PT Assessment Patient needs continued PT services  PT Problem List Decreased activity tolerance;Decreased balance;Decreased mobility;Cardiopulmonary status limiting activity;Pain       PT Treatment Interventions DME instruction;Gait training;Functional mobility training;Therapeutic activities;Therapeutic exercise;Balance training;Cognitive remediation;Patient/family education    PT Goals (Current goals can be found in the Care Plan section)  Acute Rehab  PT Goals Patient Stated Goal: get back to HHPT PT Goal Formulation: With patient/family Time For Goal Achievement: 02/07/24 Potential to Achieve Goals: Fair    Frequency Min 2X/week        AM-PAC PT "6 Clicks" Mobility  Outcome Measure Help needed turning from your back to your side while in a flat bed without using bedrails?: None Help needed moving from lying on your back to sitting on the side of a flat bed without using bedrails?: None Help needed moving to and from a bed to a chair (including a wheelchair)?: None Help needed standing up from a chair using your arms (e.g., wheelchair or bedside chair)?: None Help needed to walk in hospital room?: A Little Help needed climbing 3-5 steps with a railing? : A Lot 6 Click Score: 21    End of Session Equipment Utilized During Treatment: Gait belt Activity Tolerance: Patient tolerated treatment well Patient left: in bed;with call bell/phone within reach;Other (comment) (transport present to take pt to CT) Nurse Communication: Mobility status PT Visit Diagnosis: Unsteadiness on feet (R26.81);Muscle weakness (generalized) (M62.81);History of falling (Z91.81);Pain Pain - Right/Left: Left Pain - part of body: Ankle and joints of foot    Time: 1332-1401  PT Time Calculation (min) (ACUTE ONLY): 29 min   Charges:   PT Evaluation $PT Eval Moderate Complexity: 1 Mod PT Treatments $Gait Training: 8-22 mins PT General Charges $$ ACUTE PT VISIT: 1 Visit         Tandra Rosado B. Jewel Mortimer PT, DPT Acute Rehabilitation Services Please use secure chat or  Call Office [phone]   Verlie Glisson [facility] 01/24/2024, 3:46 PM

## 2024-01-24 NOTE — Progress Notes (Signed)
 PROGRESS NOTE    Pamela [name]  ZOX:[id number] DOB: December 22, 1945 DOA: 01/22/2024 PCP: [name], MD    Brief Narrative:  78 year old with history of chronic combined heart failure, also history of hypertensive, CKD stage IIIb, type 2 diabetes and paroxysmal A-fib along with protein S deficiency presented with multiple episodes of syncope.  Currently wearing a heart monitor as outpatient.  On the day of admission she had another syncopal episode at her primary care doctor's office.  On initial presentation to the emergency room blood pressure was 183/73, heart rate 82, respiratory 20, on room air.  Lungs with no wheezing or rhonchi.  EKG with no acute findings.  Subjective: Patient seen and examined.  Her husband and daughter were at the bedside.  Patient denied any recurrent episodes of dizziness or lightheadedness.  She has not mobilized yet.  She started having some left flank pain last night and wanted to know why.  Remains afebrile.  Negative for orthostatic symptoms today.   Assessment & Plan:   Syncope: Multifactorial.  Orthostatics ruled out and improved now.  Continue orthostatic precautions. Patient currently wearing 30-day heart monitor, she had 2 events on the monitor.  She will have event review at cardiology clinic. Adequate hydration. Holding diuretic therapy.  Continue carvedilol .  Right flank pain, recurrent UTI: Urine culture with 20,000 of Enterococcus.  Currently on amoxicillin .  Will complete 5 days of therapy. With right flank pain, rule out kidney stone.  Check CT scan of the abdomen pelvis today.  CKD stage IIIb: At about baseline.  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  No evidence of recurrent thromboembolism.  Uncontrolled hypertension: Blood pressure elevated.  Now on carvedilol .  Avoid strict blood pressure control to avoid orthostatic symptoms.  Type 2 diabetes with diabetic neuropathy: On insulin .  Continue.  Paroxysmal A-fib:  Sinus rhythm.  Anticoagulated with Eliquis .  Ambulatory monitoring as above.  Mobilize.  CT scan.   DVT prophylaxis:  apixaban  (ELIQUIS ) tablet 5 mg   Code Status: Full code Family Communication: Daughter at the bedside.  Husband at the bedside. Disposition Plan: Status is: Observation The patient remains OBS appropriate and will d/c before 2 midnights.     Consultants:  Cardiology  Procedures:  None  Antimicrobials:  Amoxicillin      Objective: Vitals:   01/24/24 0809 01/24/24 0858 01/24/24 1205 01/24/24 1251  BP: (!) 149/85 (!) 157/57 (!) 138/49 (!) 138/49  Pulse: 81 83 62   Resp: 19  18   Temp: 97.7 F (36.5 C)  97.7 F (36.5 C)   TempSrc: Oral  Oral   SpO2: 97%  97%   Weight:      Height:        Intake/Output Summary (Last 24 hours) at 01/24/2024 1401 Last data filed at 01/24/2024 0506 Gross per 24 hour  Intake 120 ml  Output 400 ml  Net -280 ml   Filed Weights   01/23/24 1505 01/24/24 0104  Weight: 59 kg 62.1 kg    Examination:  General exam: Appears calm and comfortable  Respiratory system: Clear to auscultation. Respiratory effort normal. Cardiovascular system: S1 & S2 heard, RRR.  Zio patch on the anterior chest wall. Gastrointestinal system: Abdomen is nondistended, soft and nontender. No organomegaly or masses felt. Normal bowel sounds heard. Central nervous system: Alert and oriented. No focal neurological deficits. Extremities: Symmetric 5 x 5 power. Skin: No rashes, lesions or ulcers Psychiatry: Judgement and insight appear normal. Mood & affect appropriate.  Data Reviewed: I have personally reviewed following labs and imaging studies  CBC: Recent Labs  Lab 01/22/24 1618 01/23/24 0502  WBC 8.3 7.3  NEUTROABS 6.3 5.3  HGB 12.2 11.7*  HCT 36.3 34.9*  MCV 94.0 93.3  PLT 292 261   Basic Metabolic Panel: Recent Labs  Lab 01/22/24 1618 01/22/24 1805 01/23/24 0502 01/24/24 0217  NA 136  --  135 134*  K 4.4  --  3.9 4.5   CL 99  --  102 99  CO2 27  --  25 25  GLUCOSE 170*  --  239* 225*  BUN 33*  --  31* 34*  CREATININE 1.24*  --  1.27* 1.21*  CALCIUM  9.0  --  9.0 9.1  MG  --  1.8 1.8 2.3   GFR: Estimated Creatinine Clearance: 31 mL/min (A) (by C-G formula based on SCr of 1.21 mg/dL (H)). Liver Function Tests: Recent Labs  Lab 01/22/24 1618 01/23/24 0502  AST 29 24  ALT 25 23  ALKPHOS 80 81  BILITOT 1.0 0.8  PROT 6.4* 6.1*  ALBUMIN  3.5 3.1*   No results for input(s): "LIPASE", "AMYLASE" in the last 168 hours. No results for input(s): "AMMONIA" in the last 168 hours. Coagulation Profile: No results for input(s): "INR", "PROTIME" in the last 168 hours. Cardiac Enzymes: No results for input(s): "CKTOTAL", "CKMB", "CKMBINDEX", "TROPONINI" in the last 168 hours. BNP (last 3 results) Recent Labs    09/27/23 1421 12/20/23 1615  PROBNP 91.0 524   HbA1C: No results for input(s): "HGBA1C" in the last 72 hours. CBG: Recent Labs  Lab 01/23/24 1310 01/23/24 1527 01/23/24 2144 01/24/24 0631 01/24/24 1206  GLUCAP 144* 167* 184* 192* 197*   Lipid Profile: No results for input(s): "CHOL", "HDL", "LDLCALC", "TRIG", "CHOLHDL", "LDLDIRECT" in the last 72 hours. Thyroid  Function Tests: Recent Labs    01/23/24 0502  TSH 2.012   Anemia Panel: No results for input(s): "VITAMINB12", "FOLATE", "FERRITIN", "TIBC", "IRON", "RETICCTPCT" in the last 72 hours. Sepsis Labs: No results for input(s): "PROCALCITON", "LATICACIDVEN" in the last 168 hours.  Recent Results (from the past 240 hours)  Urine Culture     Status: Abnormal   Collection Time: 01/17/24  5:59 PM   Specimen: Urine, Clean Catch  Result Value Ref Range Status   Specimen Description URINE, CLEAN CATCH  Final   Special Requests NONE  Final   Culture (A)  Final    20,000 COLONIES/mL ENTEROCOCCUS FAECALIS 10,000 COLONIES/mL STREPTOCOCCUS AGALACTIAE TESTING AGAINST S. AGALACTIAE NOT ROUTINELY PERFORMED DUE TO PREDICTABILITY OF  AMP/PEN/VAN SUSCEPTIBILITY. Performed at [facility], [address] [place], [place] [address]    Report Status 01/21/2024 FINAL  Final   Organism ID, Bacteria ENTEROCOCCUS FAECALIS (A)  Final      Susceptibility   Enterococcus faecalis - MIC*    AMPICILLIN <=2 SENSITIVE Sensitive     NITROFURANTOIN  <=16 SENSITIVE Sensitive     VANCOMYCIN  1 SENSITIVE Sensitive     * 20,000 COLONIES/mL ENTEROCOCCUS FAECALIS         Radiology Studies: DG Ankle 2 Views Left Result Date: 01/22/2024 CLINICAL DATA:  Left ankle pain EXAM: LEFT ANKLE - 2 VIEW COMPARISON:  Foot radiographs 01/17/2024 FINDINGS: Distal fibular plate and screw fixator without complicating feature. Atheromatous vascular calcifications. Mild deformity and faint lucency along the medial malleolus compatible with old healed fracture. Plantar calcaneal spur. Degenerative findings in the midfoot and Lisfranc joint. No acute bony finding is observed on this two view series.  IMPRESSION: 1. No acute findings. 2. Distal fibular plate and screw fixator without complicating feature. 3. Old healed fracture of the medial malleolus. 4. Degenerative findings in the midfoot and Lisfranc joint. 5. Plantar calcaneal spur. 6. Atheromatous vascular calcifications. Electronically Signed   By: Freida Jes M.D.   On: 01/22/2024 17:40   DG Chest Portable 1 View Result Date: 01/22/2024 CLINICAL DATA:  Weakness.  Left ankle pain. EXAM: PORTABLE CHEST 1 VIEW COMPARISON:  12/22/2023 FINDINGS: Right upper lobe scarring appear stable. Prominent and asymmetric degenerative glenohumeral arthropathy on the right. Thoracic spondylosis. Heart size within normal limits for projection. No blunting of the costophrenic angles. IMPRESSION: 1. No acute findings. 2. Right upper lobe scarring. 3. Thoracic spondylosis. 4. Prominent and asymmetric degenerative glenohumeral arthropathy on the right. Electronically Signed   By: Freida Jes M.D.   On: 01/22/2024  17:37        Scheduled Meds:  amoxicillin -clavulanate  1 tablet Oral Q12H   apixaban   5 mg Oral BID   atorvastatin   80 mg Oral Daily   carvedilol   12.5 mg Oral BID WC   cyanocobalamin   1,000 mcg Oral Q lunch   ferrous sulfate   325 mg Oral Q lunch   insulin  aspart  0-6 Units Subcutaneous TID WC   insulin  aspart  6 Units Subcutaneous TID WC   lisinopril   10 mg Oral Daily   mirabegron  ER  25 mg Oral Daily   pantoprazole   40 mg Oral Daily   pyridoxine  100 mg Oral Q lunch   Continuous Infusions:   LOS: 0 days    Time spent: 42 minutes    Vada Garibaldi, MD [facility]

## 2024-01-25 ENCOUNTER — Telehealth: Payer: Self-pay | Admitting: Nurse Practitioner

## 2024-01-25 ENCOUNTER — Observation Stay (HOSPITAL_BASED_OUTPATIENT_CLINIC_OR_DEPARTMENT_OTHER)

## 2024-01-25 ENCOUNTER — Observation Stay (HOSPITAL_COMMUNITY)

## 2024-01-25 DIAGNOSIS — I428 Other cardiomyopathies: Secondary | ICD-10-CM | POA: Diagnosis not present

## 2024-01-25 DIAGNOSIS — R55 Syncope and collapse: Secondary | ICD-10-CM | POA: Diagnosis not present

## 2024-01-25 DIAGNOSIS — G319 Degenerative disease of nervous system, unspecified: Secondary | ICD-10-CM | POA: Diagnosis not present

## 2024-01-25 DIAGNOSIS — I5020 Unspecified systolic (congestive) heart failure: Secondary | ICD-10-CM | POA: Diagnosis not present

## 2024-01-25 DIAGNOSIS — I6782 Cerebral ischemia: Secondary | ICD-10-CM | POA: Diagnosis not present

## 2024-01-25 LAB — GLUCOSE, CAPILLARY
Glucose-Capillary: 136 mg/dL — ABNORMAL HIGH (ref 70–99)
Glucose-Capillary: 144 mg/dL — ABNORMAL HIGH (ref 70–99)
Glucose-Capillary: 224 mg/dL — ABNORMAL HIGH (ref 70–99)
Glucose-Capillary: 190 mg/dL — ABNORMAL HIGH (ref 70–99)
Glucose-Capillary: 133 mg/dL — ABNORMAL HIGH (ref 70–99)

## 2024-01-25 LAB — BASIC METABOLIC PANEL WITH GFR
Anion gap: 10 (ref 5–15)
BUN: 36 mg/dL — ABNORMAL HIGH (ref 8–23)
CO2: 23 mmol/L (ref 22–32)
Calcium: 8.9 mg/dL (ref 8.9–10.3)
Chloride: 102 mmol/L (ref 98–111)
Creatinine, Ser: 1.16 mg/dL — ABNORMAL HIGH (ref 0.44–1.00)
GFR, Estimated: 48 mL/min — ABNORMAL LOW (ref >60)
Glucose, Bld: 133 mg/dL — ABNORMAL HIGH (ref 70–99)
Potassium: 3.9 mmol/L (ref 3.5–5.1)
Sodium: 135 mmol/L (ref 135–145)

## 2024-01-25 MED ORDER — LISINOPRIL 10 MG PO TABS: 10.0000 mg | ORAL_TABLET | Freq: Every day | ORAL | 0 refills | Status: DC

## 2024-01-25 MED ORDER — FUROSEMIDE 20 MG PO TABS: 20.0000 mg | ORAL_TABLET | Freq: Every day | ORAL | Status: DC | PRN

## 2024-01-25 MED ORDER — AMOXICILLIN-POT CLAVULANATE 875-125 MG PO TABS: 1.0000 | ORAL_TABLET | Freq: Two times a day (BID) | ORAL | 0 refills | Status: AC

## 2024-01-25 NOTE — Telephone Encounter (Signed)
 Pt currently at [facility] due her passing at pcp office. She is also wearing a heart monitor, hosp cardiologist is recommending to go ahead and take monitor off and mail in. Pt's daughter is wanting our opinion before doing so. Requesting cb

## 2024-01-25 NOTE — Progress Notes (Signed)
 Carotid duplex has been completed.   Results can be found under chart review under CV PROC. 01/25/2024 6:19 PM Treyvon Blahut RVT, RDMS

## 2024-01-25 NOTE — Progress Notes (Signed)
 PROGRESS NOTE    Pamela [name]  ZOX:[id number] DOB: 1946/02/19 DOA: 01/22/2024 PCP: [name], MD    Brief Narrative:  78 year old with history of chronic combined heart failure, hypertension, CKD stage IIIb, type 2 diabetes, paroxysmal A-fib and protein as deficiency presented with multiple episodes of syncope.  Currently wearing heart monitor.  Also suffered injury to the left ankle with blackout spells.  She had another syncopal episode at PCP office.  On initial presentation to the ER blood pressures were elevated.  Heart rate was stable.  EKG without any acute changes.  Incomplete right bundle branch block.   Remains in the hospital, working up for cause of syncope.   Subjective:  Patient seen and examined.  Husband and daughter at the bedside.  No more syncopal episode however patient has only walked to the door and back.  Negative orthostatic today. Multiple questions were answered. CT scan abdomen pelvis reviewed, no active findings.  Previous hematoma. Daughter requested MRI of the brain and carotid ultrasound.  Assessment & Plan:   Syncope: Multifactorial.  Orthostatics ruled out and improved now.  Continue orthostatic precautions. Patient currently wearing 30-day heart monitor, she had 2 events on the monitor.  She will have event review at cardiology clinic. Adequate hydration. Medication changes by cardiology as below. MRI of the brain to look for any evidence of recurrent TIA or stroke.  Carotid arteries. Back on carvedilol .  Lisinopril  10 mg, reduced dose.  Lasix  as needed.  Right flank pain, recurrent UTI: Urine culture with 20,000 of Enterococcus.  Currently on amoxicillin .  Will complete 5 days of therapy. With right flank pain, a CT scan abdomen pelvis was done that did not show any evidence of obstructive stone or acute abnormalities.  Bladder wall thickening likely related to recurrent cystitis. Patient will benefit with urodynamic studies,  referral sent to urology for outpatient follow-up.  CKD stage IIIb: At about baseline.  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  No evidence of recurrent thromboembolism.  Uncontrolled hypertension: Blood pressure elevated.  Now on carvedilol .  Avoid strict blood pressure control to avoid orthostatic symptoms.  Type 2 diabetes with diabetic neuropathy: On insulin .  Continue.  Paroxysmal A-fib: Sinus rhythm.  Anticoagulated with Eliquis .  Ambulatory monitoring as above.   Continue to mobilize.  Monitor orthostatic symptoms.   DVT prophylaxis:  apixaban  (ELIQUIS ) tablet 5 mg   Code Status: Full code Family Communication: Daughter at the bedside.  Husband at the bedside. Disposition Plan: Status is: Observation The patient remains OBS appropriate and will d/c before 2 midnights.     Consultants:  Cardiology  Procedures:  None  Antimicrobials:  Amoxicillin      Objective: Vitals:   01/25/24 0022 01/25/24 0501 01/25/24 0737 01/25/24 1038  BP: (!) 129/39 (!) 164/58 (!) 153/74 (!) 132/55  Pulse: (!) 58 77 71 72  Resp: 18 18 19 18   Temp: 97.9 F (36.6 C) 97.9 F (36.6 C) 97.9 F (36.6 C) 97.9 F (36.6 C)  TempSrc: Oral Oral Oral Oral  SpO2: 96% 98% 97% 97%  Weight:  61.7 kg    Height:        Intake/Output Summary (Last 24 hours) at 01/25/2024 1246 Last data filed at 01/25/2024 8119 Gross per 24 hour  Intake 720 ml  Output --  Net 720 ml   Filed Weights   01/23/24 1505 01/24/24 0104 01/25/24 0501  Weight: 59 kg 62.1 kg 61.7 kg    Examination:  General exam: Appears calm and comfortable.  Pleasant interaction. Respiratory system: Clear to auscultation. Respiratory effort normal. Cardiovascular system: S1 & S2 heard, RRR.  Zio patch on the anterior chest wall. Gastrointestinal system: Abdomen is nondistended, soft and nontender. No organomegaly or masses felt. Normal bowel sounds heard. Central nervous system: Alert and oriented. No focal  neurological deficits. Left leg on ankle brace.    Data Reviewed: I have personally reviewed following labs and imaging studies  CBC: Recent Labs  Lab 01/22/24 1618 01/23/24 0502  WBC 8.3 7.3  NEUTROABS 6.3 5.3  HGB 12.2 11.7*  HCT 36.3 34.9*  MCV 94.0 93.3  PLT 292 261   Basic Metabolic Panel: Recent Labs  Lab 01/22/24 1618 01/22/24 1805 01/23/24 0502 01/24/24 0217 01/25/24 0242  NA 136  --  135 134* 135  K 4.4  --  3.9 4.5 3.9  CL 99  --  102 99 102  CO2 27  --  25 25 23   GLUCOSE 170*  --  239* 225* 133*  BUN 33*  --  31* 34* 36*  CREATININE 1.24*  --  1.27* 1.21* 1.16*  CALCIUM  9.0  --  9.0 9.1 8.9  MG  --  1.8 1.8 2.3  --    GFR: Estimated Creatinine Clearance: 32.3 mL/min (A) (by C-G formula based on SCr of 1.16 mg/dL (H)). Liver Function Tests: Recent Labs  Lab 01/22/24 1618 01/23/24 0502  AST 29 24  ALT 25 23  ALKPHOS 80 81  BILITOT 1.0 0.8  PROT 6.4* 6.1*  ALBUMIN  3.5 3.1*   No results for input(s): "LIPASE", "AMYLASE" in the last 168 hours. No results for input(s): "AMMONIA" in the last 168 hours. Coagulation Profile: No results for input(s): "INR", "PROTIME" in the last 168 hours. Cardiac Enzymes: No results for input(s): "CKTOTAL", "CKMB", "CKMBINDEX", "TROPONINI" in the last 168 hours. BNP (last 3 results) Recent Labs    09/27/23 1421 12/20/23 1615  PROBNP 91.0 524   HbA1C: No results for input(s): "HGBA1C" in the last 72 hours. CBG: Recent Labs  Lab 01/24/24 1611 01/24/24 2106 01/25/24 0228 01/25/24 0618 01/25/24 1035  GLUCAP 243* 306* 136* 144* 224*   Lipid Profile: No results for input(s): "CHOL", "HDL", "LDLCALC", "TRIG", "CHOLHDL", "LDLDIRECT" in the last 72 hours. Thyroid  Function Tests: Recent Labs    01/23/24 0502  TSH 2.012   Anemia Panel: No results for input(s): "VITAMINB12", "FOLATE", "FERRITIN", "TIBC", "IRON", "RETICCTPCT" in the last 72 hours. Sepsis Labs: No results for input(s): "PROCALCITON",  "LATICACIDVEN" in the last 168 hours.  Recent Results (from the past 240 hours)  Urine Culture     Status: Abnormal   Collection Time: 01/17/24  5:59 PM   Specimen: Urine, Clean Catch  Result Value Ref Range Status   Specimen Description URINE, CLEAN CATCH  Final   Special Requests NONE  Final   Culture (A)  Final    20,000 COLONIES/mL ENTEROCOCCUS FAECALIS 10,000 COLONIES/mL STREPTOCOCCUS AGALACTIAE TESTING AGAINST S. AGALACTIAE NOT ROUTINELY PERFORMED DUE TO PREDICTABILITY OF AMP/PEN/VAN SUSCEPTIBILITY. Performed at [facility], [address] [place], [place] [address]    Report Status 01/21/2024 FINAL  Final   Organism ID, Bacteria ENTEROCOCCUS FAECALIS (A)  Final      Susceptibility   Enterococcus faecalis - MIC*    AMPICILLIN <=2 SENSITIVE Sensitive     NITROFURANTOIN  <=16 SENSITIVE Sensitive     VANCOMYCIN  1 SENSITIVE Sensitive     * 20,000 COLONIES/mL ENTEROCOCCUS FAECALIS         Radiology Studies: CT  RENAL STONE STUDY Result Date: 01/24/2024 CLINICAL DATA:  Abdominal and flank pain EXAM: CT ABDOMEN AND PELVIS WITHOUT CONTRAST TECHNIQUE: Multidetector CT imaging of the abdomen and pelvis was performed following the standard protocol without IV contrast. RADIATION DOSE REDUCTION: This exam was performed according to the departmental dose-optimization program which includes automated exposure control, adjustment of the mA and/or kV according to patient size and/or use of iterative reconstruction technique. COMPARISON:  Renal ultrasound 09/28/2023. CT abdomen and pelvis 08/04/2022. CT abdomen and pelvis 11/30/2022. FINDINGS: Lower chest: No acute abnormality. Hepatobiliary: No focal liver abnormality is seen. Status post cholecystectomy. No biliary dilatation. Pancreas: Unremarkable. No pancreatic ductal dilatation or surrounding inflammatory changes. Spleen: Normal in size without focal abnormality. Adrenals/Urinary Tract: There is bladder wall thickening versus normal  under distension. The bilateral kidneys and adrenal glands are within normal limits. Stomach/Bowel: Stomach is within normal limits. Appendix is not seen. No evidence of bowel wall thickening, distention, or inflammatory changes. Vascular/Lymphatic: Aortic atherosclerosis. No enlarged abdominal or pelvic lymph nodes. Reproductive: Status post hysterectomy. No adnexal masses. Other: There is no ascites or focal abdominal wall hernia. There is some subcutaneous scarring or residual hematoma is in the left lower anterior abdominal wall with largest area measuring 7 x 6 x 14 mm. Musculoskeletal: Degenerative changes affect the spine and hips. IMPRESSION: 1. Bladder wall thickening versus normal under distension. Correlate clinically for cystitis. 2. Subcutaneous scarring or residual hematomas in the left lower anterior abdominal wall. This has significantly decreased from prior. 3. Aortic atherosclerosis. Aortic Atherosclerosis (ICD10-I70.0). Electronically Signed   By: Tyron Gallon M.D.   On: 01/24/2024 18:10        Scheduled Meds:  amoxicillin -clavulanate  1 tablet Oral Q12H   apixaban   5 mg Oral BID   atorvastatin   80 mg Oral Daily   carvedilol   12.5 mg Oral BID WC   cyanocobalamin   1,000 mcg Oral Q lunch   Dulaglutide   0.75 mg Subcutaneous Weekly   ferrous sulfate   325 mg Oral Q lunch   insulin  aspart  0-9 Units Subcutaneous TID PC,HS,0200   insulin  aspart  6 Units Subcutaneous TID WC   lisinopril   10 mg Oral Daily   mirabegron  ER  25 mg Oral Daily   pantoprazole   40 mg Oral Daily   pyridoxine  100 mg Oral Q lunch   Continuous Infusions:   LOS: 0 days    Time spent: 40 minutes    Pamela Garibaldi, MD [facility]

## 2024-01-25 NOTE — Progress Notes (Signed)
 Rounding Note    Patient Name: Pamela [name] Date of Encounter: 01/25/2024  [facility] Cardiologist: Kardie Tobb, DO   Subjective   BP 132/55. Cr stable at 1.16.  Reports lightheadedness has resolved  Inpatient Medications    Scheduled Meds:  amoxicillin -clavulanate  1 tablet Oral Q12H   apixaban   5 mg Oral BID   atorvastatin   80 mg Oral Daily   carvedilol   12.5 mg Oral BID WC   cyanocobalamin   1,000 mcg Oral Q lunch   Dulaglutide   0.75 mg Subcutaneous Weekly   ferrous sulfate   325 mg Oral Q lunch   insulin  aspart  0-9 Units Subcutaneous TID PC,HS,0200   insulin  aspart  6 Units Subcutaneous TID WC   lisinopril   10 mg Oral Daily   mirabegron  ER  25 mg Oral Daily   pantoprazole   40 mg Oral Daily   pyridoxine  100 mg Oral Q lunch   Continuous Infusions:  PRN Meds: acetaminophen , hydrALAZINE    Vital Signs    Vitals:   01/25/24 0022 01/25/24 0501 01/25/24 0737 01/25/24 1038  BP: (!) 129/39 (!) 164/58 (!) 153/74 (!) 132/55  Pulse: (!) 58 77 71 72  Resp: 18 18 19 18   Temp: 97.9 F (36.6 C) 97.9 F (36.6 C) 97.9 F (36.6 C) 97.9 F (36.6 C)  TempSrc: Oral Oral Oral Oral  SpO2: 96% 98% 97% 97%  Weight:  61.7 kg    Height:        Intake/Output Summary (Last 24 hours) at 01/25/2024 1041 Last data filed at 01/25/2024 5409 Gross per 24 hour  Intake 720 ml  Output --  Net 720 ml      01/25/2024    5:01 AM 01/24/2024    1:04 AM 01/23/2024    3:05 PM  Last 3 Weights  Weight (lbs) 136 lb 137 lb 130 lb  Weight (kg) 61.689 kg 62.143 kg 58.968 kg      Telemetry    NSR - Personally Reviewed  ECG    No new ECG - Personally Reviewed  Physical Exam   GEN: No acute distress.   Neck: No JVD Cardiac: RRR, no murmurs, rubs, or gallops.  Respiratory: Clear to auscultation bilaterally. GI: Soft, nontender, non-distended  MS: No edema; No deformity. Neuro:  Nonfocal  Psych: Normal affect   Labs    High Sensitivity Troponin:   Recent  Labs  Lab 01/22/24 1618 01/22/24 1805  TROPONINIHS 12 10     Chemistry Recent Labs  Lab 01/22/24 1618 01/22/24 1805 01/23/24 0502 01/24/24 0217 01/25/24 0242  NA 136  --  135 134* 135  K 4.4  --  3.9 4.5 3.9  CL 99  --  102 99 102  CO2 27  --  25 25 23   GLUCOSE 170*  --  239* 225* 133*  BUN 33*  --  31* 34* 36*  CREATININE 1.24*  --  1.27* 1.21* 1.16*  CALCIUM  9.0  --  9.0 9.1 8.9  MG  --  1.8 1.8 2.3  --   PROT 6.4*  --  6.1*  --   --   ALBUMIN  3.5  --  3.1*  --   --   AST 29  --  24  --   --   ALT 25  --  23  --   --   ALKPHOS 80  --  81  --   --   BILITOT 1.0  --  0.8  --   --  GFRNONAA 44*  --  43* 46* 48*  ANIONGAP 10  --  8 10 10     Lipids No results for input(s): "CHOL", "TRIG", "HDL", "LABVLDL", "LDLCALC", "CHOLHDL" in the last 168 hours.  Hematology Recent Labs  Lab 01/22/24 1618 01/23/24 0502  WBC 8.3 7.3  RBC 3.86* 3.74*  HGB 12.2 11.7*  HCT 36.3 34.9*  MCV 94.0 93.3  MCH 31.6 31.3  MCHC 33.6 33.5  RDW 14.3 14.2  PLT 292 261   Thyroid   Recent Labs  Lab 01/23/24 0502  TSH 2.012    BNPNo results for input(s): "BNP", "PROBNP" in the last 168 hours.  DDimer No results for input(s): "DDIMER" in the last 168 hours.   Radiology    CT RENAL STONE STUDY Result Date: 01/24/2024 CLINICAL DATA:  Abdominal and flank pain EXAM: CT ABDOMEN AND PELVIS WITHOUT CONTRAST TECHNIQUE: Multidetector CT imaging of the abdomen and pelvis was performed following the standard protocol without IV contrast. RADIATION DOSE REDUCTION: This exam was performed according to the departmental dose-optimization program which includes automated exposure control, adjustment of the mA and/or kV according to patient size and/or use of iterative reconstruction technique. COMPARISON:  Renal ultrasound 09/28/2023. CT abdomen and pelvis 08/04/2022. CT abdomen and pelvis 11/30/2022. FINDINGS: Lower chest: No acute abnormality. Hepatobiliary: No focal liver abnormality is seen. Status post  cholecystectomy. No biliary dilatation. Pancreas: Unremarkable. No pancreatic ductal dilatation or surrounding inflammatory changes. Spleen: Normal in size without focal abnormality. Adrenals/Urinary Tract: There is bladder wall thickening versus normal under distension. The bilateral kidneys and adrenal glands are within normal limits. Stomach/Bowel: Stomach is within normal limits. Appendix is not seen. No evidence of bowel wall thickening, distention, or inflammatory changes. Vascular/Lymphatic: Aortic atherosclerosis. No enlarged abdominal or pelvic lymph nodes. Reproductive: Status post hysterectomy. No adnexal masses. Other: There is no ascites or focal abdominal wall hernia. There is some subcutaneous scarring or residual hematoma is in the left lower anterior abdominal wall with largest area measuring 7 x 6 x 14 mm. Musculoskeletal: Degenerative changes affect the spine and hips. IMPRESSION: 1. Bladder wall thickening versus normal under distension. Correlate clinically for cystitis. 2. Subcutaneous scarring or residual hematomas in the left lower anterior abdominal wall. This has significantly decreased from prior. 3. Aortic atherosclerosis. Aortic Atherosclerosis (ICD10-I70.0). Electronically Signed   By: Tyron Gallon M.D.   On: 01/24/2024 18:10    Cardiac Studies     Patient Profile     78 y.o. female with a hx of chronic HFmrEF, PAF, HTN, orthostatic hypotension, HLD, prior TIA, DM2, CKD 3 protein S deficiency who is being seen 01/22/2024 for the evaluation of syncope   Assessment & Plan    1.Syncope - unclear etiology. She has chronic orthostatic hypotension and orthostatic dizziness. Earlier in the day before the episode she had what sounded like vagal symptoms, with nausea/strong urge to have a bowel movement, followed by fatigue after. Her first episode has a prodrome while standing in the grocery store parking lot, episode on 5/19 she denies any prior warning and occurred while  sitting. She has a UTI that has been untreated that convolutes things as well.  - recent extensive cardiac workup.  - recent echo with just low normal to mildly reduced LVEF, no structural findings that would cause syncope - recent cath with normal coronaries - prior monitor short runs of NSVT and SVT on monitor in 2024. Currently has on a repeat 30 day monitor - Given fluids in ED, orthostatics subsequently showed resolution  of orthostatic hypotension -She will need to mail her current monitor in to get results.    2. HFmrEF - 12/2023 echo: LVE 45-50%, no WMAs, grade I dd. Normal RV function. Mild MR 12/2023 cath: normal coronaries, LVEDP 27-30 - lasix  was changed to prn during 01/18/24 visit but reports was still taking. Has been on and off per family report based on different MD evaluatoins - hold diuretic today, appears hypovolemic. Careful with IVFs, from 12/2023 cath LVEDP 27-30 - with recurrent UTIs and orthostatic issues would d/c her SGLT2i.   -Currently on coreg  12.5 mg twice daily.  Prior to admission was also on lisinopril  20 mg twice daily and Imdur  30 mg daily and Farxiga  10 mg daily and Lasix  20 mg daily.  Added back lisinopril  at 10 mg daily.  Will plan to discharge on as needed Lasix    3.PAF - per clinic notes, not well documented and has been on eliquis  primarily for protein S deficiency   4. Frequent UTIs - abx this admit per primary team - unclear how much may have played a role in these episodes.  [facility] will sign off.   Medication Recommendations:  Carvedilol  12.5 mg twice daily, lisinopril  10 mg daily.  Discontinued Imdur  and Farxiga .  Recommend Lasix  20 mg daily as needed.  Would monitor daily weights and take Lasix  as needed if gains more than 3 pounds in 1 day or 5 pounds in 1 week Other recommendations (labs, testing, etc):  None Follow up as an outpatient:  Will schedule   For questions or updates, please contact [facility] Please  consult [email or web address] for contact info under        Signed, Wendie Hamburg, MD  01/25/2024, 10:41 AM

## 2024-01-25 NOTE — Plan of Care (Signed)
  Problem: Education: Goal: Ability to describe self-care measures that may prevent or decrease complications (Diabetes Survival Skills Education) will improve Outcome: Progressing   Problem: Coping: Goal: Ability to adjust to condition or change in health will improve Outcome: Progressing   Problem: Fluid Volume: Goal: Ability to maintain a balanced intake and output will improve Outcome: Progressing   Problem: Health Behavior/Discharge Planning: Goal: Ability to identify and utilize available resources and services will improve Outcome: Progressing   Problem: Health Behavior/Discharge Planning: Goal: Ability to manage health-related needs will improve Outcome: Progressing   Problem: Health Behavior/Discharge Planning: Goal: Ability to manage health-related needs will improve Outcome: Progressing   Problem: Metabolic: Goal: Ability to maintain appropriate glucose levels will improve Outcome: Progressing

## 2024-01-25 NOTE — Telephone Encounter (Signed)
 Spoke with daughter per DPR and she states patient had a syncope episode while at PCP office. She was admitted on Monday.  She has been wearing monitor for 7 days. She states Dr. Alda Amas states its ok to take the monitor off and send it. She states she was also told no keep it on for 30 days. She wanted to make sure Berkley Breech was ok with her returning the monitor being that she was the provider who ordered it.   Spoke with Sherline Distel and she advised to follow recommendations from rounding providers.

## 2024-01-26 ENCOUNTER — Other Ambulatory Visit (HOSPITAL_COMMUNITY): Payer: Self-pay

## 2024-01-26 DIAGNOSIS — R55 Syncope and collapse: Secondary | ICD-10-CM | POA: Diagnosis not present

## 2024-01-26 LAB — GLUCOSE, CAPILLARY
Glucose-Capillary: 137 mg/dL — ABNORMAL HIGH (ref 70–99)
Glucose-Capillary: 154 mg/dL — ABNORMAL HIGH (ref 70–99)

## 2024-01-26 NOTE — Discharge Summary (Signed)
 Physician Discharge Summary  Pamela [name] DGU:[id number] DOB: 05/29/1946 DOA: 01/22/2024  PCP: [name], MD  Admit date: 01/22/2024 Discharge date: 01/26/2024  Admitted From: Home Disposition: Home  Recommendations for Outpatient Follow-up:  Follow up with PCP in 1-2 weeks Please obtain BMP/CBC in one week Cardiology to schedule follow-up  Home Health: N/A.  Outpatient therapies. Equipment/Devices: N/A  Discharge Condition: Stable CODE STATUS: Full code Diet recommendation: Low-salt diet, nutritional supplements  Discharge summary: 78 year old with history of chronic combined heart failure, hypertension, CKD stage IIIb, type 2 diabetes, paroxysmal A-fib and protein S deficiency presented with multiple episodes of syncope.  Currently wearing heart monitor.  Also suffered injury to the left ankle with blackout spells.  She had another syncopal episode at PCP office.  On initial presentation to the ER blood pressures were elevated.  Heart rate was stable.  EKG without any acute changes.  Incomplete right bundle branch block.   Underwent extensive workup.  Recent extensive cardiac workup.  These episodes were thought to be from multiple blood pressure medications in combination.  Zio patch is yet to be interrogated.  # Syncope: Multifactorial.  Orthostatics ruled out and improved now.  Continue orthostatic precautions even at home.  Patient currently wearing 30-day heart monitor, she had 2 events on the monitor.  She will have event review at cardiology clinic. Adequate hydration. Medication changes by cardiology as below. MRI of the brain, no evidence of recent infarction.  Carotid ultrasounds with no carotid artery disease.  Back on carvedilol .  Lisinopril  10 mg, reduced dose.  Lasix  as needed. Stable today.  No new events while in the hospital for last 4 days.  Going home with cardiology follow-up.   Right flank pain, recurrent UTI: Urine culture with 20,000 of  Enterococcus.  Currently on amoxicillin .  Will complete 5 days of therapy. With right flank pain, a CT scan abdomen pelvis was done that did not show any evidence of obstructive stone or acute abnormalities.  Bladder wall thickening likely related to recurrent cystitis. Patient will benefit with urodynamic studies, referral sent to urology for outpatient follow-up.   CKD stage IIIb: At about baseline.   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  No evidence of recurrent thromboembolism.  Therapeutic on Eliquis .   Uncontrolled hypertension: Blood pressure elevated.  Now on carvedilol .  Avoid strict blood pressure control to avoid orthostatic symptoms.   Type 2 diabetes with diabetic neuropathy: On insulin .  Continue.   Paroxysmal A-fib: Sinus rhythm.  Anticoagulated with Eliquis .  Ambulatory monitoring as above.   Stable to discharge home with outpatient follow-up.   Discharge Diagnoses:  Principal Problem:   Syncope Active Problems:   NICM (nonischemic cardiomyopathy) (HCC)   Stage 3b chronic kidney disease (HCC)   Protein S deficiency (HCC)   Uncontrolled hypertension   Type 2 diabetes mellitus with diabetic neuropathy, with long-term current use of insulin  (HCC)   Paroxysmal atrial fibrillation (HCC)   UTI (urinary tract infection)    Discharge Instructions  Discharge Instructions     Ambulatory referral to Urology   Complete by: As directed    Diet - low sodium heart healthy   Complete by: As directed    Diet Carb Modified   Complete by: As directed    Increase activity slowly   Complete by: As directed       Allergies as of 01/26/2024       Reactions   Ketek [telithromycin] Nausea And Vomiting, Rash   Loxapine Succinate Hives  Macrodantin  [nitrofurantoin  Macrocrystal] Other (See Comments)   Pulmonary fibrosis   Naproxen Sodium Anaphylaxis, Hives   Amoxapine And Related Hives   Darvon Nausea And Vomiting   Levothyroxine  Sodium Photosensitivity, Other  (See Comments)   Blurry and double vision   Belsomra [suvorexant] Other (See Comments)   "Nightmares"   Cymbalta [duloxetine Hcl] Other (See Comments)   "Blackouts"   Duloxetine    Gabapentin  Other (See Comments)   Blurry vision   Lyrica  [pregabalin ]    Makes her sedated   Ozempic  (0.25 Or 0.5 Mg-dose) [semaglutide (0.25 Or 0.5mg -dos)] Diarrhea   Semaglutide  Diarrhea   Rybelsus    Amoxicillin  Rash   Propoxyphene Nausea And Vomiting        Medication List     STOP taking these medications    cephALEXin  500 MG capsule Commonly known as: KEFLEX    dapagliflozin  propanediol 10 MG Tabs tablet Commonly known as: Farxiga    isosorbide  mononitrate 30 MG 24 hr tablet Commonly known as: IMDUR        TAKE these medications    acetaminophen  650 MG CR tablet Commonly known as: TYLENOL  Take 650-1,300 mg by mouth every 8 (eight) hours as needed for pain.   albuterol  108 (90 Base) MCG/ACT inhaler Commonly known as: VENTOLIN  HFA INHALE 1 TO 2 PUFFS INTO THE LUNGS EVERY 6 HOURS AS NEEDED FOR WHEEZING OR SHORTNESS OF BREATH   amoxicillin -clavulanate 875-125 MG tablet Commonly known as: AUGMENTIN  Take 1 tablet by mouth every 12 (twelve) hours for 2 days.   aspirin  EC 81 MG tablet Commonly known as: Aspirin  Low Dose Take 1 tablet (81 mg total) by mouth daily. Swallow whole.   atorvastatin  80 MG tablet Commonly known as: LIPITOR  TAKE 1 TABLET(80 MG) BY MOUTH DAILY   carboxymethylcellulose 0.5 % Soln Commonly known as: REFRESH PLUS Place 1 drop into both eyes 3 (three) times daily as needed (dry eyes).   carvedilol  12.5 MG tablet Commonly known as: COREG  Take 1 tablet (12.5 mg total) by mouth 2 (two) times daily with a meal.   cyanocobalamin  1000 MCG tablet Commonly known as: VITAMIN B12 Take 1,000 mcg by mouth daily with lunch.   Eliquis  5 MG Tabs tablet Generic drug: apixaban  TAKE 1 TABLET(5 MG) BY MOUTH TWICE DAILY   estradiol 0.1 MG/GM vaginal cream Commonly known  as: ESTRACE Place 1 Applicatorful vaginally 2 (two) times a week. As needed on Tues and Thurs.   ferrous sulfate  325 (65 FE) MG tablet Take 325 mg by mouth daily with lunch.   furosemide  20 MG tablet Commonly known as: LASIX  Take 1 tablet (20 mg total) by mouth daily as needed for fluid or edema. What changed:  when to take this reasons to take this   Gemtesa  75 MG Tabs Generic drug: Vibegron  Take 1 tablet (75 mg total) by mouth at bedtime.   lisinopril  10 MG tablet Commonly known as: ZESTRIL  Take 1 tablet (10 mg total) by mouth daily. What changed:  medication strength how much to take how to take this when to take this additional instructions   magnesium  oxide 400 (240 Mg) MG tablet Commonly known as: MAG-OX TAKE 2 TABLETS(800 MG) BY MOUTH TWICE DAILY What changed: See the new instructions.   NovoLOG  FlexPen 100 UNIT/ML FlexPen Generic drug: insulin  aspart Max daily 30 units What changed:  how much to take how to take this when to take this additional instructions   nystatin cream Commonly known as: MYCOSTATIN Apply 1 Application topically 4 (four) times daily as needed for  dry skin (yeast infection).   ondansetron  8 MG disintegrating tablet Commonly known as: ZOFRAN -ODT Take 8 mg by mouth every 8 (eight) hours as needed for nausea or vomiting.   pantoprazole  40 MG tablet Commonly known as: PROTONIX  TAKE 1 TABLET(40 MG) BY MOUTH EVERY MORNING   PRESERVISION AREDS PO Take 1 capsule by mouth 2 (two) times daily.   pyridoxine 100 MG tablet Commonly known as: B-6 Take 100 mg by mouth daily with lunch.   sennosides-docusate sodium  8.6-50 MG tablet Commonly known as: SENOKOT-S Take 1 tablet by mouth every other day.   sodium chloride  0.65 % Soln nasal spray Commonly known as: OCEAN Place 1 spray into both nostrils as needed for congestion.   Trulicity  0.75 MG/0.5ML Soaj Generic drug: Dulaglutide  Inject 0.75 mg into the skin once a week. What changed:  additional instructions   Vitamin D3 50 MCG (2000 UT) Tabs Take 4,000 Units by mouth in the morning and at bedtime.   zolpidem  5 MG tablet Commonly known as: AMBIEN  TAKE 1 TABLET(5 MG) BY MOUTH AT BEDTIME AS NEEDED FOR SLEEP What changed: See the new instructions.        Follow-up Information     [facility], Burleigh Carp, MD Follow up on 02/06/2024.   Specialty: Family Medicine Why: 11 am for hospital follow up Contact information: [address] [place] [address] [phone]                Allergies  Allergen Reactions   Ketek [Telithromycin] Nausea And Vomiting and Rash   Loxapine Succinate Hives   Macrodantin  [Nitrofurantoin  Macrocrystal] Other (See Comments)    Pulmonary fibrosis   Naproxen Sodium Anaphylaxis and Hives   Amoxapine And Related Hives   Darvon Nausea And Vomiting   Levothyroxine  Sodium Photosensitivity and Other (See Comments)    Blurry and double vision   Belsomra [Suvorexant] Other (See Comments)    "Nightmares"   Cymbalta [Duloxetine Hcl] Other (See Comments)    "Blackouts"   Duloxetine    Gabapentin  Other (See Comments)    Blurry vision   Lyrica  [Pregabalin ]     Makes her sedated   Ozempic  (0.25 Or 0.5 Mg-Dose) [Semaglutide (0.25 Or 0.5mg -Dos)] Diarrhea   Semaglutide  Diarrhea    Rybelsus    Amoxicillin  Rash   Propoxyphene Nausea And Vomiting    Consultations: Cardiology   Procedures/Studies: VAS US  CAROTID Result Date: 01/26/2024 Carotid Arterial Duplex Study Patient Name:  Felishia LOUISE STEWART Sheller  Date of Exam:   01/25/2024 Medical Rec #: [id number]                     Accession #:    [id number] Date of Birth: 05/22/1946                     Patient Gender: F Patient Age:   78 years Exam Location:  [facility] Procedure:      VAS US  CAROTID Referring Phys: Vada Garibaldi --------------------------------------------------------------------------------  Indications:       Syncope. Risk Factors:      Hypertension,  hyperlipidemia, Diabetes, no history of                    smoking. Other Factors:     CKD, Afib, CHF, TIA, sleep apnea. Comparison Study:  No previous exams Performing Technologist: Jody Hill RVT, RDMS  Examination Guidelines: A complete evaluation includes B-mode imaging, spectral Doppler, color Doppler, and power Doppler as needed of all accessible portions  of each vessel. Bilateral testing is considered an integral part of a complete examination. Limited examinations for reoccurring indications may be performed as noted.  Right Carotid Findings: +----------+--------+--------+--------+------------------+--------+           PSV cm/sEDV cm/sStenosisPlaque DescriptionComments +----------+--------+--------+--------+------------------+--------+ CCA Prox  67      8                                          +----------+--------+--------+--------+------------------+--------+ CCA Distal60      13                                         +----------+--------+--------+--------+------------------+--------+ ICA Prox  58      12              calcific and focal         +----------+--------+--------+--------+------------------+--------+ ICA Distal73      13                                         +----------+--------+--------+--------+------------------+--------+ ECA       75                      calcific                   +----------+--------+--------+--------+------------------+--------+ +----------+--------+-------+----------------+-------------------+           PSV cm/sEDV cmsDescribe        Arm Pressure (mmHG) +----------+--------+-------+----------------+-------------------+ [id number]            Multiphasic, WNL                    +----------+--------+-------+----------------+-------------------+ +---------+--------+--+--------+-+---------+ VertebralPSV cm/s58EDV cm/s9Antegrade +---------+--------+--+--------+-+---------+  Left Carotid Findings:  +----------+--------+--------+--------+------------------+--------+           PSV cm/sEDV cm/sStenosisPlaque DescriptionComments +----------+--------+--------+--------+------------------+--------+ CCA Prox  83      8                                          +----------+--------+--------+--------+------------------+--------+ CCA Distal70      9                                          +----------+--------+--------+--------+------------------+--------+ ICA Prox  63      11              calcific                   +----------+--------+--------+--------+------------------+--------+ ICA Distal89      15                                         +----------+--------+--------+--------+------------------+--------+ ECA       103                     focal and calcific         +----------+--------+--------+--------+------------------+--------+ +----------+--------+--------+----------------+-------------------+  PSV cm/sEDV cm/sDescribe        Arm Pressure (mmHG) +----------+--------+--------+----------------+-------------------+ Subclavian113             Multiphasic, WNL                    +----------+--------+--------+----------------+-------------------+ +---------+--------+--+--------+--+---------+ VertebralPSV cm/s61EDV cm/s10Antegrade +---------+--------+--+--------+--+---------+   Summary: Right Carotid: The extracranial vessels were near-normal with only minimal wall                thickening or plaque. Left Carotid: The extracranial vessels were near-normal with only minimal wall               thickening or plaque. Vertebrals:  Bilateral vertebral arteries demonstrate antegrade flow. Subclavians: Normal flow hemodynamics were seen in bilateral subclavian              arteries. *See table(s) above for measurements and observations.  Electronically signed by Delaney Fearing on 01/26/2024 at 8:19:34 AM.    Final    MR BRAIN WO CONTRAST Result Date:  01/25/2024 CLINICAL DATA:  Initial evaluation for acute TIA. EXAM: MRI HEAD WITHOUT CONTRAST TECHNIQUE: Multiplanar, multiecho pulse sequences of the brain and surrounding structures were obtained without intravenous contrast. COMPARISON:  Prior CT from 02/18/2020 and MRI from 02/01/2014. FINDINGS: Brain: Mild age-related cerebral atrophy. Patchy T2/FLAIR hyperintensity involving the supratentorial cerebral white matter, consistent with chronic small vessel ischemic disease, mild for age. No evidence for acute or subacute infarct. No areas of chronic cortical infarction. No acute or chronic intracranial blood products. No mass lesion, midline shift or mass effect no hydrocephalus or extra-axial fluid collection. Pituitary gland within normal limits. Vascular: Major intracranial vascular flow voids are maintained. Skull and upper cervical spine: Craniocervical junction within normal limits. Bone marrow signal intensity normal. No scalp soft tissue abnormality. Sinuses/Orbits: Prior bilateral ocular lens replacement. Paranasal sinuses are largely clear. No significant mastoid effusion. Other: None. IMPRESSION: 1. No acute intracranial abnormality. 2. Mild age-related cerebral atrophy with chronic small vessel ischemic disease. Electronically Signed   By: Virgia Griffins M.D.   On: 01/25/2024 19:17   CT RENAL STONE STUDY Result Date: 01/24/2024 CLINICAL DATA:  Abdominal and flank pain EXAM: CT ABDOMEN AND PELVIS WITHOUT CONTRAST TECHNIQUE: Multidetector CT imaging of the abdomen and pelvis was performed following the standard protocol without IV contrast. RADIATION DOSE REDUCTION: This exam was performed according to the departmental dose-optimization program which includes automated exposure control, adjustment of the mA and/or kV according to patient size and/or use of iterative reconstruction technique. COMPARISON:  Renal ultrasound 09/28/2023. CT abdomen and pelvis 08/04/2022. CT abdomen and pelvis  11/30/2022. FINDINGS: Lower chest: No acute abnormality. Hepatobiliary: No focal liver abnormality is seen. Status post cholecystectomy. No biliary dilatation. Pancreas: Unremarkable. No pancreatic ductal dilatation or surrounding inflammatory changes. Spleen: Normal in size without focal abnormality. Adrenals/Urinary Tract: There is bladder wall thickening versus normal under distension. The bilateral kidneys and adrenal glands are within normal limits. Stomach/Bowel: Stomach is within normal limits. Appendix is not seen. No evidence of bowel wall thickening, distention, or inflammatory changes. Vascular/Lymphatic: Aortic atherosclerosis. No enlarged abdominal or pelvic lymph nodes. Reproductive: Status post hysterectomy. No adnexal masses. Other: There is no ascites or focal abdominal wall hernia. There is some subcutaneous scarring or residual hematoma is in the left lower anterior abdominal wall with largest area measuring 7 x 6 x 14 mm. Musculoskeletal: Degenerative changes affect the spine and hips. IMPRESSION: 1. Bladder wall thickening versus normal under distension. Correlate clinically for cystitis. 2.  Subcutaneous scarring or residual hematomas in the left lower anterior abdominal wall. This has significantly decreased from prior. 3. Aortic atherosclerosis. Aortic Atherosclerosis (ICD10-I70.0). Electronically Signed   By: Tyron Gallon M.D.   On: 01/24/2024 18:10   DG Ankle 2 Views Left Result Date: 01/22/2024 CLINICAL DATA:  Left ankle pain EXAM: LEFT ANKLE - 2 VIEW COMPARISON:  Foot radiographs 01/17/2024 FINDINGS: Distal fibular plate and screw fixator without complicating feature. Atheromatous vascular calcifications. Mild deformity and faint lucency along the medial malleolus compatible with old healed fracture. Plantar calcaneal spur. Degenerative findings in the midfoot and Lisfranc joint. No acute bony finding is observed on this two view series. IMPRESSION: 1. No acute findings. 2. Distal  fibular plate and screw fixator without complicating feature. 3. Old healed fracture of the medial malleolus. 4. Degenerative findings in the midfoot and Lisfranc joint. 5. Plantar calcaneal spur. 6. Atheromatous vascular calcifications. Electronically Signed   By: Freida Jes M.D.   On: 01/22/2024 17:40   DG Chest Portable 1 View Result Date: 01/22/2024 CLINICAL DATA:  Weakness.  Left ankle pain. EXAM: PORTABLE CHEST 1 VIEW COMPARISON:  12/22/2023 FINDINGS: Right upper lobe scarring appear stable. Prominent and asymmetric degenerative glenohumeral arthropathy on the right. Thoracic spondylosis. Heart size within normal limits for projection. No blunting of the costophrenic angles. IMPRESSION: 1. No acute findings. 2. Right upper lobe scarring. 3. Thoracic spondylosis. 4. Prominent and asymmetric degenerative glenohumeral arthropathy on the right. Electronically Signed   By: Freida Jes M.D.   On: 01/22/2024 17:37   DG Foot Complete Left Result Date: 01/17/2024 CLINICAL DATA:  Trauma to the left foot. EXAM: LEFT FOOT - COMPLETE 3+ VIEW COMPARISON:  None Available. FINDINGS: There is no acute fracture or dislocation. The bones are osteopenic. Distal fibula fixation sideplate and screws. IMPRESSION: 1. No acute fracture or dislocation. 2. Osteopenia. Electronically Signed   By: Angus Bark M.D.   On: 01/17/2024 17:32   (Echo, Carotid, EGD, Colonoscopy, ERCP)    Subjective: Patient seen and examined.  Was walking out of the bathroom with some support.  Denies any dizziness lightheadedness.  No abnormal events on the monitor.  Daughter at the bedside. Multiple questions were answered.  Test results were reviewed.   Discharge Exam: Vitals:   01/26/24 0345 01/26/24 0801  BP: (!) 125/42 (!) 181/58  Pulse: 63 69  Resp: 17 18  Temp: 98.2 F (36.8 C) 97.9 F (36.6 C)  SpO2: 96% 96%   Vitals:   01/26/24 0040 01/26/24 0044 01/26/24 0345 01/26/24 0801  BP:  (!) 173/56 (!) 125/42 (!)  181/58  Pulse:  73 63 69  Resp:  18 17 18   Temp: 98.2 F (36.8 C)  98.2 F (36.8 C) 97.9 F (36.6 C)  TempSrc: Oral  Oral Oral  SpO2:  95% 96% 96%  Weight:   62.1 kg   Height:        General: Pt is alert, awake, not in acute distress Cardiovascular: RRR, S1/S2 +, no rubs, no gallops Respiratory: CTA bilaterally, no wheezing, no rhonchi Abdominal: Soft, NT, ND, bowel sounds + Extremities: no edema, no cyanosis Left leg on ankle brace.    The results of significant diagnostics from this hospitalization (including imaging, microbiology, ancillary and laboratory) are listed below for reference.     Microbiology: Recent Results (from the past 240 hours)  Urine Culture     Status: Abnormal   Collection Time: 01/17/24  5:59 PM   Specimen: Urine, Clean Catch  Result  Value Ref Range Status   Specimen Description URINE, CLEAN CATCH  Final   Special Requests NONE  Final   Culture (A)  Final    20,000 COLONIES/mL ENTEROCOCCUS FAECALIS 10,000 COLONIES/mL STREPTOCOCCUS AGALACTIAE TESTING AGAINST S. AGALACTIAE NOT ROUTINELY PERFORMED DUE TO PREDICTABILITY OF AMP/PEN/VAN SUSCEPTIBILITY. Performed at [facility], [address], [place], [place] [address]    Report Status 01/21/2024 FINAL  Final   Organism ID, Bacteria ENTEROCOCCUS FAECALIS (A)  Final      Susceptibility   Enterococcus faecalis - MIC*    AMPICILLIN <=2 SENSITIVE Sensitive     NITROFURANTOIN  <=16 SENSITIVE Sensitive     VANCOMYCIN  1 SENSITIVE Sensitive     * 20,000 COLONIES/mL ENTEROCOCCUS FAECALIS     Labs: BNP (last 3 results) Recent Labs    12/07/23 1529  BNP 202.5*   Basic Metabolic Panel: Recent Labs  Lab 01/22/24 1618 01/22/24 1805 01/23/24 0502 01/24/24 0217 01/25/24 0242  NA 136  --  135 134* 135  K 4.4  --  3.9 4.5 3.9  CL 99  --  102 99 102  CO2 27  --  25 25 23   GLUCOSE 170*  --  239* 225* 133*  BUN 33*  --  31* 34* 36*  CREATININE 1.24*  --  1.27* 1.21* 1.16*  CALCIUM  9.0  --   9.0 9.1 8.9  MG  --  1.8 1.8 2.3  --    Liver Function Tests: Recent Labs  Lab 01/22/24 1618 01/23/24 0502  AST 29 24  ALT 25 23  ALKPHOS 80 81  BILITOT 1.0 0.8  PROT 6.4* 6.1*  ALBUMIN  3.5 3.1*   No results for input(s): "LIPASE", "AMYLASE" in the last 168 hours. No results for input(s): "AMMONIA" in the last 168 hours. CBC: Recent Labs  Lab 01/22/24 1618 01/23/24 0502  WBC 8.3 7.3  NEUTROABS 6.3 5.3  HGB 12.2 11.7*  HCT 36.3 34.9*  MCV 94.0 93.3  PLT 292 261   Cardiac Enzymes: No results for input(s): "CKTOTAL", "CKMB", "CKMBINDEX", "TROPONINI" in the last 168 hours. BNP: Invalid input(s): "POCBNP" CBG: Recent Labs  Lab 01/25/24 1035 01/25/24 1602 01/25/24 2057 01/26/24 0212 01/26/24 0602  GLUCAP 224* 190* 133* 137* 154*   D-Dimer No results for input(s): "DDIMER" in the last 72 hours. Hgb A1c No results for input(s): "HGBA1C" in the last 72 hours. Lipid Profile No results for input(s): "CHOL", "HDL", "LDLCALC", "TRIG", "CHOLHDL", "LDLDIRECT" in the last 72 hours. Thyroid  function studies No results for input(s): "TSH", "T4TOTAL", "T3FREE", "THYROIDAB" in the last 72 hours.  Invalid input(s): "FREET3" Anemia work up No results for input(s): "VITAMINB12", "FOLATE", "FERRITIN", "TIBC", "IRON", "RETICCTPCT" in the last 72 hours. Urinalysis    Component Value Date/Time   COLORURINE YELLOW 01/22/2024 1633   APPEARANCEUR CLOUDY (A) 01/22/2024 1633   LABSPEC 1.010 01/22/2024 1633   PHURINE 6.0 01/22/2024 1633   GLUCOSEU >=500 (A) 01/22/2024 1633   HGBUR NEGATIVE 01/22/2024 1633   BILIRUBINUR NEGATIVE 01/22/2024 1633   BILIRUBINUR negative 01/17/2024 1754   BILIRUBINUR negative 08/04/2022 1614   KETONESUR NEGATIVE 01/22/2024 1633   PROTEINUR NEGATIVE 01/22/2024 1633   UROBILINOGEN 0.2 01/17/2024 1754   NITRITE NEGATIVE 01/22/2024 1633   LEUKOCYTESUR LARGE (A) 01/22/2024 1633   Sepsis Labs Recent Labs  Lab 01/22/24 1618 01/23/24 0502  WBC 8.3 7.3    Microbiology Recent Results (from the past 240 hours)  Urine Culture     Status: Abnormal   Collection Time: 01/17/24  5:59 PM   Specimen: Urine, Clean Catch  Result Value Ref Range Status   Specimen Description URINE, CLEAN CATCH  Final   Special Requests NONE  Final   Culture (A)  Final    20,000 COLONIES/mL ENTEROCOCCUS FAECALIS 10,000 COLONIES/mL STREPTOCOCCUS AGALACTIAE TESTING AGAINST S. AGALACTIAE NOT ROUTINELY PERFORMED DUE TO PREDICTABILITY OF AMP/PEN/VAN SUSCEPTIBILITY. Performed at [facility], [address] [place], [place] [address]    Report Status 01/21/2024 FINAL  Final   Organism ID, Bacteria ENTEROCOCCUS FAECALIS (A)  Final      Susceptibility   Enterococcus faecalis - MIC*    AMPICILLIN <=2 SENSITIVE Sensitive     NITROFURANTOIN  <=16 SENSITIVE Sensitive     VANCOMYCIN  1 SENSITIVE Sensitive     * 20,000 COLONIES/mL ENTEROCOCCUS FAECALIS     Time coordinating discharge: 35 minutes  SIGNED:   Vada Garibaldi, MD  [facility] 01/26/2024, 9:24 AM

## 2024-01-26 NOTE — Progress Notes (Signed)
Discharge instructions provided to patient. All medications, follow up appointments, and discharge instructions provided. IV out. Monitor off CCMD notified. Discharging home.

## 2024-01-26 NOTE — Plan of Care (Signed)
  Problem: Health Behavior/Discharge Planning: Goal: Ability to manage health-related needs will improve Outcome: Progressing   Problem: Metabolic: Goal: Ability to maintain appropriate glucose levels will improve Outcome: Progressing   Problem: Skin Integrity: Goal: Risk for impaired skin integrity will decrease Outcome: Progressing   Problem: Clinical Measurements: Goal: Will remain free from infection Outcome: Progressing   Problem: Clinical Measurements: Goal: Diagnostic test results will improve Outcome: Progressing

## 2024-01-26 NOTE — Progress Notes (Signed)
 Physical Therapy Treatment Patient Details Name: Pamela [name] MRN: [id number] DOB: 1946-08-07 Today's Date: 01/26/2024   History of Present Illness 78y.o. female was brought to the ER 5/19 from patient's PCP office after patient had brief episode of loss of consciousness. Was at the PCP for prior syncopal episode during which she injured her L ankle but x-rays did not show any fx. PMH of orthostatic hypotension, cardiomyopathy MD, GER, HTN, OA, Osteoporosis, CKD, DDD, Hand surgery, ORIF L ankle, TKA    PT Comments  Pt is making good progress towards her goals as she is mod I for bed mobility and supervision for transfers and ambulation to bathroom and back. Treatment limited as MD in room to speak to patient and daughter. D/c plans remain appropriate. Plan for discharge this afternoon.     If plan is discharge home, recommend the following: A little help with walking and/or transfers;A little help with bathing/dressing/bathroom;Assistance with cooking/housework;Assist for transportation;Help with stairs or ramp for entrance   Can travel by private vehicle      Yes  Equipment Recommendations  None recommended by PT       Precautions / Restrictions Precautions Precautions: Fall Precaution/Restrictions Comments: LLE boot Required Braces or Orthoses:  (walking boot on L LE) Restrictions Weight Bearing Restrictions Per Provider Order: No     Mobility  Bed Mobility Overal bed mobility: Modified Independent                  Transfers Overall transfer level: Needs assistance Equipment used: Rolling walker (2 wheels) Transfers: Sit to/from Stand Sit to Stand: Supervision           General transfer comment: good power up and self steadying from EoB, use of grab bar to come to standing from low toilet    Ambulation/Gait Ambulation/Gait assistance: Supervision Gait Distance (Feet): 20 Feet Assistive device: Rolling walker (2 wheels) Gait Pattern/deviations:  Step-through pattern, Decreased stance time - left, Decreased weight shift to left, Antalgic, Decreased step length - right, Decreased step length - left Gait velocity: slowed Gait velocity interpretation: <1.31 ft/sec, indicative of household ambulator   General Gait Details: supervision for ambulation to bathroom and back      Balance Overall balance assessment: Mild deficits observed, not formally tested                                          Communication Communication Communication: No apparent difficulties  Cognition Arousal: Alert Behavior During Therapy: WFL for tasks assessed/performed   PT - Cognitive impairments: No apparent impairments                         Following commands: Intact      Cueing Cueing Techniques: Verbal cues, Gestural cues     General Comments General comments (skin integrity, edema, etc.): VSS on RA      Pertinent Vitals/Pain Pain Assessment Pain Assessment: Faces Faces Pain Scale: Hurts even more Pain Location: L ankle with ambulation Pain Descriptors / Indicators: Discomfort Pain Intervention(s): Limited activity within patient's tolerance, Monitored during session, Repositioned     PT Goals (current goals can now be found in the care plan section) Acute Rehab PT Goals Patient Stated Goal: get back to HHPT PT Goal Formulation: With patient/family Time For Goal Achievement: 02/07/24 Potential to Achieve Goals: Fair Progress towards PT goals: Progressing toward  goals    Frequency    Min 2X/week       AM-PAC PT "6 Clicks" Mobility   Outcome Measure  Help needed turning from your back to your side while in a flat bed without using bedrails?: None Help needed moving from lying on your back to sitting on the side of a flat bed without using bedrails?: None Help needed moving to and from a bed to a chair (including a wheelchair)?: None Help needed standing up from a chair using your arms (e.g.,  wheelchair or bedside chair)?: None Help needed to walk in hospital room?: A Little Help needed climbing 3-5 steps with a railing? : A Lot 6 Click Score: 21    End of Session Equipment Utilized During Treatment: Gait belt Activity Tolerance: Patient tolerated treatment well Patient left: in bed;with call bell/phone within reach (sitting EoB MD in room to speak to patient and daughter) Nurse Communication: Mobility status PT Visit Diagnosis: Unsteadiness on feet (R26.81);Muscle weakness (generalized) (M62.81);History of falling (Z91.81);Pain Pain - Right/Left: Left Pain - part of body: Ankle and joints of foot     Time: 5366-4403 PT Time Calculation (min) (ACUTE ONLY): 14 min  Charges:    $Therapeutic Activity: 8-22 mins PT General Charges $$ ACUTE PT VISIT: 1 Visit                     Launi Asencio B. Jewel Mortimer PT, DPT Acute Rehabilitation Services Please use secure chat or  Call Office [phone]    Verlie Glisson [facility] 01/26/2024, 11:41 AM

## 2024-01-30 ENCOUNTER — Other Ambulatory Visit: Payer: Self-pay

## 2024-01-30 DIAGNOSIS — I10 Essential (primary) hypertension: Secondary | ICD-10-CM

## 2024-01-30 DIAGNOSIS — E782 Mixed hyperlipidemia: Secondary | ICD-10-CM

## 2024-01-30 DIAGNOSIS — E1140 Type 2 diabetes mellitus with diabetic neuropathy, unspecified: Secondary | ICD-10-CM

## 2024-01-31 ENCOUNTER — Other Ambulatory Visit: Payer: Self-pay | Admitting: Family Medicine

## 2024-01-31 ENCOUNTER — Other Ambulatory Visit

## 2024-01-31 ENCOUNTER — Telehealth: Payer: Self-pay | Admitting: Family Medicine

## 2024-01-31 ENCOUNTER — Ambulatory Visit: Payer: Medicare PPO

## 2024-01-31 DIAGNOSIS — N1831 Chronic kidney disease, stage 3a: Secondary | ICD-10-CM | POA: Diagnosis not present

## 2024-01-31 DIAGNOSIS — I130 Hypertensive heart and chronic kidney disease with heart failure and stage 1 through stage 4 chronic kidney disease, or unspecified chronic kidney disease: Secondary | ICD-10-CM | POA: Diagnosis not present

## 2024-01-31 DIAGNOSIS — I5021 Acute systolic (congestive) heart failure: Secondary | ICD-10-CM | POA: Diagnosis not present

## 2024-01-31 DIAGNOSIS — E782 Mixed hyperlipidemia: Secondary | ICD-10-CM

## 2024-01-31 DIAGNOSIS — Z794 Long term (current) use of insulin: Secondary | ICD-10-CM | POA: Diagnosis not present

## 2024-01-31 DIAGNOSIS — I10 Essential (primary) hypertension: Secondary | ICD-10-CM

## 2024-01-31 DIAGNOSIS — E1140 Type 2 diabetes mellitus with diabetic neuropathy, unspecified: Secondary | ICD-10-CM

## 2024-01-31 DIAGNOSIS — Z48812 Encounter for surgical aftercare following surgery on the circulatory system: Secondary | ICD-10-CM | POA: Diagnosis not present

## 2024-01-31 DIAGNOSIS — E871 Hypo-osmolality and hyponatremia: Secondary | ICD-10-CM | POA: Diagnosis not present

## 2024-01-31 DIAGNOSIS — D631 Anemia in chronic kidney disease: Secondary | ICD-10-CM | POA: Diagnosis not present

## 2024-01-31 DIAGNOSIS — I088 Other rheumatic multiple valve diseases: Secondary | ICD-10-CM | POA: Diagnosis not present

## 2024-01-31 DIAGNOSIS — E119 Type 2 diabetes mellitus without complications: Secondary | ICD-10-CM | POA: Diagnosis not present

## 2024-01-31 DIAGNOSIS — I5033 Acute on chronic diastolic (congestive) heart failure: Secondary | ICD-10-CM | POA: Diagnosis not present

## 2024-01-31 NOTE — Telephone Encounter (Signed)
 [facility] HOME HEALTH ORDER [phone]

## 2024-02-01 ENCOUNTER — Ambulatory Visit: Payer: Self-pay | Admitting: Family Medicine

## 2024-02-01 LAB — CBC WITH DIFFERENTIAL/PLATELET
Basophils Absolute: 0.1 10*3/uL (ref 0.0–0.2)
Basos: 1 %
EOS (ABSOLUTE): 0.1 10*3/uL (ref 0.0–0.4)
Eos: 1 %
Hematocrit: 38.5 % (ref 34.0–46.6)
Hemoglobin: 12.6 g/dL (ref 11.1–15.9)
Immature Grans (Abs): 0 10*3/uL (ref 0.0–0.1)
Immature Granulocytes: 0 %
Lymphocytes Absolute: 1.6 10*3/uL (ref 0.7–3.1)
Lymphs: 24 %
MCH: 31.6 pg (ref 26.6–33.0)
MCHC: 32.7 g/dL (ref 31.5–35.7)
MCV: 97 fL (ref 79–97)
Monocytes Absolute: 0.7 10*3/uL (ref 0.1–0.9)
Monocytes: 10 %
Neutrophils Absolute: 4.3 10*3/uL (ref 1.4–7.0)
Neutrophils: 64 %
Platelets: 295 10*3/uL (ref 150–450)
RBC: 3.99 x10E6/uL (ref 3.77–5.28)
RDW: 12.8 % (ref 11.7–15.4)
WBC: 6.8 10*3/uL (ref 3.4–10.8)

## 2024-02-01 LAB — LIPID PANEL
Chol/HDL Ratio: 2 ratio (ref 0.0–4.4)
Cholesterol, Total: 125 mg/dL (ref 100–199)
HDL: 61 mg/dL (ref >39)
LDL Chol Calc (NIH): 54 mg/dL (ref 0–99)
Triglycerides: 41 mg/dL (ref 0–149)
VLDL Cholesterol Cal: 10 mg/dL (ref 5–40)

## 2024-02-01 LAB — COMPREHENSIVE METABOLIC PANEL WITH GFR
ALT: 27 IU/L (ref 0–32)
AST: 29 IU/L (ref 0–40)
Albumin: 3.9 g/dL (ref 3.8–4.8)
Alkaline Phosphatase: 122 IU/L — ABNORMAL HIGH (ref 44–121)
BUN/Creatinine Ratio: 29 — ABNORMAL HIGH (ref 12–28)
BUN: 28 mg/dL — ABNORMAL HIGH (ref 8–27)
Bilirubin Total: 0.4 mg/dL (ref 0.0–1.2)
CO2: 22 mmol/L (ref 20–29)
Calcium: 9.5 mg/dL (ref 8.7–10.3)
Chloride: 97 mmol/L (ref 96–106)
Creatinine, Ser: 0.95 mg/dL (ref 0.57–1.00)
Globulin, Total: 2.2 g/dL (ref 1.5–4.5)
Glucose: 124 mg/dL — ABNORMAL HIGH (ref 70–99)
Potassium: 4.6 mmol/L (ref 3.5–5.2)
Sodium: 134 mmol/L (ref 134–144)
Total Protein: 6.1 g/dL (ref 6.0–8.5)
eGFR: 61 mL/min/{1.73_m2} (ref >59)

## 2024-02-01 LAB — HEMOGLOBIN A1C
Est. average glucose Bld gHb Est-mCnc: 163 mg/dL
Hgb A1c MFr Bld: 7.3 % — ABNORMAL HIGH (ref 4.8–5.6)

## 2024-02-01 LAB — MICROALBUMIN / CREATININE URINE RATIO
Creatinine, Urine: 41.2 mg/dL
Microalb/Creat Ratio: 45 mg/g{creat} — ABNORMAL HIGH (ref 0–29)
Microalbumin, Urine: 18.6 ug/mL

## 2024-02-02 ENCOUNTER — Encounter: Payer: Self-pay | Admitting: Internal Medicine

## 2024-02-02 ENCOUNTER — Ambulatory Visit: Payer: Medicare PPO | Admitting: Internal Medicine

## 2024-02-02 VITALS — BP 120/66 | HR 70 | Ht 60.0 in | Wt 142.0 lb

## 2024-02-02 DIAGNOSIS — E1140 Type 2 diabetes mellitus with diabetic neuropathy, unspecified: Secondary | ICD-10-CM | POA: Diagnosis not present

## 2024-02-02 DIAGNOSIS — Z794 Long term (current) use of insulin: Secondary | ICD-10-CM | POA: Diagnosis not present

## 2024-02-02 DIAGNOSIS — N1832 Chronic kidney disease, stage 3b: Secondary | ICD-10-CM | POA: Diagnosis not present

## 2024-02-02 DIAGNOSIS — E1122 Type 2 diabetes mellitus with diabetic chronic kidney disease: Secondary | ICD-10-CM

## 2024-02-02 NOTE — Progress Notes (Signed)
 Name: Pamela [name]  MRN/ DOB: [id number], 02-22-1946   Age/ Sex: 78 y.o., female    PCP: [name], MD   Reason for Endocrinology Evaluation: Type 2 Diabetes Mellitus     Date of Initial Endocrinology Visit: 03/30/2022    PATIENT IDENTIFIER: Pamela [name] is a 78 y.o. female with a past medical history of T2DM, GERD, A. Fib . The patient presented for initial endocrinology clinic visit on 02/02/2024 for consultative assistance with her diabetes management.    HPI: Pamela [name] was    Diagnosed with DM in 1985 Prior Medications tried/Intolerance: Metformin - tired .Ozempic  - diarrhea , Rybelsus  caused nausea  Hemoglobin A1c has ranged from 7.2% in 2023, peaking at 9.8% in 2022.   Of note, the pt was seen by Dr. Washington Hacker in 2021 for hyponatremia . Adrenal insufficiency was excluded    She was started on Farxiga  in July 2023 Started Trulicity  05/2023  I discontinued Toujeo  09/2023 as she was on a small dose of 6 units and was having hypoglycemia overnight  Farxiga  was discontinued by hospital 2025  SUBJECTIVE:   During the last visit (10/05/2023): A1c 6.7%    Today (02/02/24): Pamela [name] is here for a follow up on diabetes management. She checks her blood sugars multiple times daily through CGM. 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 Edson Graces (nephrology) Patient had a follow-up with cardiology CAD, she did have cardiac cath 12/07/2023 due to chest pain and shortness of breath on exertion, no evidence of angiographic CAD  She presented to the ED 01/17/2024 for foot injury, as she did feel weak that was attributed to the recent changes in her medication including spironolactone , Lasix , and Farxiga  Patient presented again to the ED 01/22/2024 due to syncope, BP was elevated   She is  accompanied by her daughter Bearl Limes Patient has been noted weight loss since her last visit here Denies recent nausea Has alternating constipation  and diarrhea    HOME DIABETES REGIMEN: Trulicity  0.75 mg weekly Novolog  6 units TIDQAC Correction factor: NovoLog  (BG -120/50)     Statin: yes ACE-I/ARB: yes    CONTINUOUS GLUCOSE MONITORING RECORD INTERPRETATION    Dates of Recording: 5/17-5/30/2025  Sensor description: Freestyle libre 3  Results statistics:   CGM use % of time 96  Average and SD 158/34.2  Time in range 69 %  % Time Above 180 22  % Time above 250 7  % Time Below target 2   Glycemic patterns summary: BGs are optimal overnight and fluctuate throughout the day  Hyperglycemic episodes post prandial  Hypoglycemic episodes occurred after bolus  Overnight periods: stable     DIABETIC COMPLICATIONS: Microvascular complications:  Macular degeneration , CKD III Denies:  Last eye exam: Completed 09/05/2023  Macrovascular complications:   Denies: CAD, PVD, CVA   PAST HISTORY: Past Medical History:  Past Medical History:  Diagnosis Date   Anticoagulated on Coumadin     chronic--- managed by hematology/ oncology,   Asthma, mild intermittent    followed by pcp--- per pt last exacerbation winter 2021 w/ acute bronchitis   Bilateral leg cramps    Blood dyscrasia    Chronic constipation    CKD (chronic kidney disease), stage III (HCC)    Closed bimalleolar fracture of left ankle 02/26/2020   DDD (degenerative disc disease), cervical  w/ spondylosis,  per pt last steroid injection 06/ 2022   DDD (degenerative disc disease), lumbosacral    DVT (deep venous thrombosis) (HCC) 07/30/2013   Dyspnea    occasionally   Dysrhythmia    GERD (gastroesophageal reflux disease)    History of cardiac murmur as a child    History of DVT of lower extremity    left lower extremity in 1980s, fell when bowling   History of pulmonary embolus (PE) 1993   per pt left  lung post op 2 wks cholecystectomy   History of rheumatic fever as a child    per last echo 12-31-2019 no valvular issues   History of TIA (transient ischemic attack)    2014 and 2018 or 2019,  per pt no residual   HTN (hypertension)    followed by pcp   Hyperlipidemia    Hypothyroidism    IDA (iron deficiency anemia)    Macular degeneration of both eyes    Mild obstructive sleep apnea    per pt dx 2017 tried to uses cpap but intolerant   Mixed incontinence urge and stress    urologist--- dr Reginal Capra   OA (osteoarthritis) 07/06/2018   Osteoporosis    PAF (paroxysmal atrial fibrillation) (HCC) 10/01/2014   cardiologist--- dr Chyrl Crawford tobb;   cardiac cath 02-18-2013 normal coronaries arteries, ef 50%, cath done since echo showed ef 30-35%; nucleat stress study 04/ 2020 normal , normal echo 04/ 2021,  event monitor 09-14-2020 rare ST/AT variable block   Pneumonia    PONV (postoperative nausea and vomiting)    Protein S deficiency (HCC)    followed by hemotology/ oncology-- dr d. Harles Lied ([facility]) dx 1980s;  prior DVT left lower leg 1980s and left lung PE 1993; chronic  coumadin  since 1980s   PVC's (premature ventricular contractions)    followed by cardiology   S/P cardiac catheterization 02/2013   Normal coronaries; low normal EF at 50%   Solitary pulmonary nodule on lung CT 02/06/2019   First noted 01/13/2014 > no change as of 12/21/2018   Spondylolisthesis, lumbar region 08/09/2018   Stroke (HCC)    TIA in 2018 or 2019   Transient ischemic attack 07/30/2013   Type 2 diabetes mellitus treated with insulin  (HCC)    endocrilogist--- whitney reardon NP     (03-10-2021  pt continuously checks blood sugar throughout the day w/ Libre, fasting sugar --- 69--200)   Wears glasses    Wears hearing aid in both ears    Past Surgical History:  Past Surgical History:  Procedure Laterality Date   ABDOMINAL HYSTERECTOMY     BLEPHAROPLASTY     CARDIAC CATHETERIZATION  02/18/2013    @ [facility]  by Dr [place];  normal coronaries w/ preserved LVF, ef 50%;   previous cath 2001 normal ef 65%   CARPAL TUNNEL RELEASE Bilateral 1994   CATARACT EXTRACTION W/ INTRAOCULAR LENS IMPLANT Bilateral 2017   CHOLECYSTECTOMY     CHOLECYSTECTOMY, LAPAROSCOPIC  1993   COLONOSCOPY     EAR BIOPSY Left    FINGER SURGERY Left 2018   thumb   FOOT SURGERY     FOOT TENDON SURGERY Right    early 2000s   FRACTURE SURGERY Left 02/13/2020   left femur   HAND SURGERY Right 04/2022   HARDWARE REMOVAL Left 03/12/2021   Procedure: HARDWARE REMOVAL;  Surgeon: Janeth Medicus, MD;  Location: [facility];  Service: Orthopedics;  Laterality: Left;  60 MINS  JOINT REPLACEMENT     LEFT HEART CATH AND CORONARY ANGIOGRAPHY N/A 12/07/2023   Procedure: LEFT HEART CATH AND CORONARY ANGIOGRAPHY;  Surgeon: Odie Benne, MD;  Location: [facility] INVASIVE CV LAB;  Service: Cardiovascular;  Laterality: N/A;   LUMBAR LAMINECTOMY/DECOMPRESSION MICRODISCECTOMY N/A 12/22/2022   Procedure: Lumbar Two-Lumbar Three, Lumbar Three-Lumbar Four Lumbar Decompression;  Surgeon: Elna Haggis, MD;  Location: [facility] OR;  Service: Neurosurgery;  Laterality: N/A;  RM 20 3C   ORIF ANKLE FRACTURE Left 02/26/2020   Procedure: OPEN REDUCTION INTERNAL FIXATION (ORIF) ANKLE FRACTURE;  Surgeon: Janeth Medicus, MD;  Location[facility];  Service: Orthopedics;  Laterality: Left;  90 mins   TONSILLECTOMY AND ADENOIDECTOMY Bilateral    TOTAL KNEE ARTHROPLASTY Left 03/2005   TOTAL VAGINAL HYSTERECTOMY  1988   per pt still has ovaries    Social History:  reports that she has never smoked. She has never used smokeless tobacco. She reports that she does not drink alcohol and does not use drugs. Family History:  Family History  Problem Relation Age of Onset   Cirrhosis Mother    Antithrombin III deficiency Mother        multiple emboli   Ulcerative colitis Mother    Diabetes Father    Coronary artery disease Father     Heart attack Father    Hypertension Father    Kidney disease Father    Hypertension Sister    Diabetes Sister    Heart attack Sister        age 98    Protein S deficiency Daughter    Heart attack Brother    Hypertension Brother    Lung cancer Brother    Stroke Neg Hx    Colitis Neg Hx    Colon polyps Neg Hx    Esophageal cancer Neg Hx    Liver cancer Neg Hx    Pancreatic cancer Neg Hx    Rectal cancer Neg Hx    Stomach cancer Neg Hx    Breast cancer Neg Hx      HOME MEDICATIONS: Allergies as of 02/02/2024       Reactions   Ketek [telithromycin] Nausea And Vomiting, Rash   Loxapine Succinate Hives   Macrodantin  [nitrofurantoin  Macrocrystal] Other (See Comments)   Pulmonary fibrosis   Naproxen Sodium Anaphylaxis, Hives   Amoxapine And Related Hives   Darvon Nausea And Vomiting   Levothyroxine  Sodium Photosensitivity, Other (See Comments)   Blurry and double vision   Belsomra [suvorexant] Other (See Comments)   "Nightmares"   Cymbalta [duloxetine Hcl] Other (See Comments)   "Blackouts"   Duloxetine    Gabapentin  Other (See Comments)   Blurry vision   Lyrica  [pregabalin ]    Makes her sedated   Ozempic  (0.25 Or 0.5 Mg-dose) [semaglutide (0.25 Or 0.5mg -dos)] Diarrhea   Semaglutide  Diarrhea   Rybelsus    Amoxicillin  Rash   Propoxyphene Nausea And Vomiting        Medication List        Accurate as of Feb 02, 2024  9:01 AM. If you have any questions, ask your nurse or doctor.          acetaminophen  650 MG CR tablet Commonly known as: TYLENOL  Take 650-1,300 mg by mouth every 8 (eight) hours as needed for pain.   albuterol  108 (90 Base) MCG/ACT inhaler Commonly known as: VENTOLIN  HFA INHALE 1 TO 2 PUFFS INTO THE LUNGS EVERY 6 HOURS AS NEEDED FOR WHEEZING OR SHORTNESS OF BREATH   aspirin  EC 81  MG tablet Commonly known as: Aspirin  Low Dose Take 1 tablet (81 mg total) by mouth daily. Swallow whole.   atorvastatin  80 MG tablet Commonly known as:  LIPITOR  TAKE 1 TABLET(80 MG) BY MOUTH DAILY   carboxymethylcellulose 0.5 % Soln Commonly known as: REFRESH PLUS Place 1 drop into both eyes 3 (three) times daily as needed (dry eyes).   carvedilol  12.5 MG tablet Commonly known as: COREG  Take 1 tablet (12.5 mg total) by mouth 2 (two) times daily with a meal.   cyanocobalamin  1000 MCG tablet Commonly known as: VITAMIN B12 Take 1,000 mcg by mouth daily with lunch.   Eliquis  5 MG Tabs tablet Generic drug: apixaban  TAKE 1 TABLET(5 MG) BY MOUTH TWICE DAILY   estradiol 0.1 MG/GM vaginal cream Commonly known as: ESTRACE Place 1 Applicatorful vaginally 2 (two) times a week. As needed on Tues and Thurs.   ferrous sulfate  325 (65 FE) MG tablet Take 325 mg by mouth daily with lunch.   furosemide  20 MG tablet Commonly known as: LASIX  Take 1 tablet (20 mg total) by mouth daily as needed for fluid or edema.   Gemtesa  75 MG Tabs Generic drug: Vibegron  Take 1 tablet (75 mg total) by mouth at bedtime.   lisinopril  10 MG tablet Commonly known as: ZESTRIL  Take 1 tablet (10 mg total) by mouth daily.   magnesium  oxide 400 (240 Mg) MG tablet Commonly known as: MAG-OX TAKE 2 TABLETS(800 MG) BY MOUTH TWICE DAILY What changed: See the new instructions.   NovoLOG  FlexPen 100 UNIT/ML FlexPen Generic drug: insulin  aspart Max daily 30 units What changed:  how much to take how to take this when to take this additional instructions   nystatin cream Commonly known as: MYCOSTATIN Apply 1 Application topically 4 (four) times daily as needed for dry skin (yeast infection).   ondansetron  8 MG disintegrating tablet Commonly known as: ZOFRAN -ODT Take 8 mg by mouth every 8 (eight) hours as needed for nausea or vomiting.   pantoprazole  40 MG tablet Commonly known as: PROTONIX  TAKE 1 TABLET(40 MG) BY MOUTH EVERY MORNING   PRESERVISION AREDS PO Take 1 capsule by mouth 2 (two) times daily.   pyridoxine  100 MG tablet Commonly known as:  B-6 Take 100 mg by mouth daily with lunch.   sennosides-docusate sodium  8.6-50 MG tablet Commonly known as: SENOKOT-S Take 1 tablet by mouth every other day.   sodium chloride  0.65 % Soln nasal spray Commonly known as: OCEAN Place 1 spray into both nostrils as needed for congestion.   Trulicity  0.75 MG/0.5ML Soaj Generic drug: Dulaglutide  Inject 0.75 mg into the skin once a week. What changed: additional instructions   Vitamin D3 50 MCG (2000 UT) Tabs Take 4,000 Units by mouth in the morning and at bedtime.   zolpidem  5 MG tablet Commonly known as: AMBIEN  TAKE 1 TABLET(5 MG) BY MOUTH AT BEDTIME AS NEEDED FOR SLEEP What changed: See the new instructions.         ALLERGIES: Allergies  Allergen Reactions   Ketek [Telithromycin] Nausea And Vomiting and Rash   Loxapine Succinate Hives   Macrodantin  [Nitrofurantoin  Macrocrystal] Other (See Comments)    Pulmonary fibrosis   Naproxen Sodium Anaphylaxis and Hives   Amoxapine And Related Hives   Darvon Nausea And Vomiting   Levothyroxine  Sodium Photosensitivity and Other (See Comments)    Blurry and double vision   Belsomra [Suvorexant] Other (See Comments)    "Nightmares"   Cymbalta [Duloxetine Hcl] Other (See Comments)    "Blackouts"  Duloxetine    Gabapentin  Other (See Comments)    Blurry vision   Lyrica  [Pregabalin ]     Makes her sedated   Ozempic  (0.25 Or 0.5 Mg-Dose) [Semaglutide (0.25 Or 0.5mg -Dos)] Diarrhea   Semaglutide  Diarrhea    Rybelsus    Amoxicillin  Rash   Propoxyphene Nausea And Vomiting     REVIEW OF SYSTEMS: A comprehensive ROS was conducted with the patient and is negative except as per HPI     OBJECTIVE:   VITAL SIGNS: BP 120/66 (BP Location: Left Arm, Patient Position: Sitting)   Pulse 70   Ht 5' (1.524 m)   Wt 142 lb (64.4 kg)   BMI 27.73 kg/m    PHYSICAL EXAM:  General: Pt appears well and is in NAD  Lungs: Clear with good BS bilat   Heart: RRR   Extremities:  Lower extremities -  No pretibial edema.   Neuro: MS is good with appropriate affect, pt is alert and Ox3    DM Foot Exam 06/05/2023  The skin of the feet is intact without sores or ulcerations. The pedal pulses are 2+ on right and 2+ on left. The sensation is decreased  to a screening 5.07, 10 gram monofilament bilaterally at the toes     DATA REVIEWED:  Lab Results  Component Value Date   HGBA1C 7.3 (H) 01/31/2024   HGBA1C 7.1 (H) 10/24/2023   HGBA1C 6.7 (A) 10/05/2023    Latest Reference Range & Units 01/31/24 08:27  Sodium 134 - 144 mmol/L 134  Potassium 3.5 - 5.2 mmol/L 4.6  Chloride 96 - 106 mmol/L 97  CO2 20 - 29 mmol/L 22  Glucose 70 - 99 mg/dL 829 (H)  BUN 8 - 27 mg/dL 28 (H)  Creatinine 5.62 - 1.00 mg/dL 1.30  Calcium  8.7 - 10.3 mg/dL 9.5  BUN/Creatinine Ratio 12 - 28  29 (H)  eGFR >59 mL/min/1.73 61  Alkaline Phosphatase 44 - 121 IU/L 122 (H)  Albumin  3.8 - 4.8 g/dL 3.9  AST 0 - 40 IU/L 29  ALT 0 - 32 IU/L 27  Total Protein 6.0 - 8.5 g/dL 6.1  Total Bilirubin 0.0 - 1.2 mg/dL 0.4    Latest Reference Range & Units 01/31/24 08:27  Total CHOL/HDL Ratio 0.0 - 4.4 ratio 2.0  Cholesterol, Total 100 - 199 mg/dL 865  HDL Cholesterol >78 mg/dL 61  MICROALB/CREAT RATIO 0 - 29 mg/g creat 45 (H)  Triglycerides 0 - 149 mg/dL 41  VLDL Cholesterol Cal 5 - 40 mg/dL 10  LDL Chol Calc (NIH) 0 - 99 mg/dL 54    Latest Reference Range & Units 01/31/24 08:27  Microalbumin, Urine Not Estab. ug/mL 18.6  MICROALB/CREAT RATIO 0 - 29 mg/g creat 45 (H)  Creatinine, Urine Not Estab. mg/dL 46.9    ASSESSMENT / PLAN / RECOMMENDATIONS:   1) Type 2 Diabetes Mellitus, Optimally controlled, With CKD III complications - Most recent A1c of 7.3 %. Goal A1c < 7.5 %.    -A1c is optimal -Labs at outside facility from 01/31/2024 were reviewed -She has reported intolerance to Ozempic  and Rybelsus  -We discontinued Toujeo  due to overnight hypoglycemia on just 6 units 07/2023 - The patient has been feeling dizzy  due to multiple diuretic agent use, Farxiga  was discontinued during hospitalization 2025 due to syncope - She has been noted with weight loss since last visit here, and I have suggested continuing the current dose of Trulicity  as to not cause drastic weight loss - Patient continues with glycemic excursions, between hyperglycemia  and hypoglycemia.  Our goal is to eliminate hypoglycemia first, I did advise the patient that she may take NovoLog  4 units if she is eating a smaller meal than usual and continue to take 6 units - We also entertained the idea of insulin  pump technology, I briefly did show her OmniPod and we did discuss how it works, I did ask her to do some research online as to whether this is something she will be able to handle overnight.  If this is something of interest to her I will be happy to prescribe it and refer her to our CDE for training  MEDICATIONS: Continue Trulicity  0.75 mg weekly Take NovoLog  4-6 units with each meal  Continue correction factor : NovoLog  (BG -130/55)  EDUCATION / INSTRUCTIONS: BG monitoring instructions: Patient is instructed to check her blood sugars 3 times a day, before meals. Call [facility] if: BG persistently < 70  I reviewed the Rule of 15 for the treatment of hypoglycemia in detail with the patient. Literature supplied.   2) Diabetic complications:  Eye: Does not have known diabetic retinopathy.  Neuro/ Feet: Does not have known diabetic peripheral neuropathy. Renal: Patient does  have known baseline CKD. She is  on an ACEI/ARB at present.   Follow-up in 4 months     Signed electronically by: Natale Bail, MD  [facility]  [facility] [address] [place], [place] [address] Phone: [phone] FAX: [phone]   CC: [name], MD [address] [place] [address] Phone: [phone]  Fax: [phone]    Return to Endocrinology clinic as  below: Future Appointments  Date Time Provider Department Center  02/02/2024  9:30 AM Airiel Oblinger, Julian Obey, MD [facility] None  02/06/2024  8:20 AM [name], MD [facility] None  02/06/2024 11:00 AM [name], MD [facility] None  02/14/2024  8:25 AM Jude Norton, NP [facility]  03/06/2024 10:30 AM Trent Frizzle, MD [facility] None  03/19/2024  9:40 AM Jerryl Morin, DO [facility]V  04/03/2024 10:20 AM [name], MD [facility] None  06/05/2024  9:30 AM [facility] None  06/05/2024 10:00 AM Deloria Fetch, MD [facility] None

## 2024-02-02 NOTE — Patient Instructions (Addendum)
 Please check out the Omnipod 5 insulin  pump online and let me know if this is something that would interest you  Continue Trulicity  0.75 mg ONCE weekly  Take  Novolog  4-6 units with each meal  Correction Novolog : ADD extra units on insulin  to your meal-time Novolog  dose if your blood sugars are higher than 185. Use the scale below to help guide you:   Blood sugar before meal Number of units to inject  Less than 185 0 unit  186 - 240 1 units  241 - 295 2 units  296 - 350 3 units  351 - 405 4 units  406 - 460 5 units      HOW TO TREAT LOW BLOOD SUGARS (Blood sugar LESS THAN 70 MG/DL) Please follow the RULE OF 15 for the treatment of hypoglycemia treatment (when your (blood sugars are less than 70 mg/dL)   STEP 1: Take 15 grams of carbohydrates when your blood sugar is low, which includes:  3-4 GLUCOSE TABS  OR 3-4 OZ OF JUICE OR REGULAR SODA OR ONE TUBE OF GLUCOSE GEL    STEP 2: RECHECK blood sugar in 15 MINUTES STEP 3: If your blood sugar is still low at the 15 minute recheck --> then, go back to STEP 1 and treat AGAIN with another 15 grams of carbohydrates.

## 2024-02-05 ENCOUNTER — Telehealth: Payer: Self-pay | Admitting: Family Medicine

## 2024-02-05 DIAGNOSIS — I5021 Acute systolic (congestive) heart failure: Secondary | ICD-10-CM | POA: Diagnosis not present

## 2024-02-05 DIAGNOSIS — D631 Anemia in chronic kidney disease: Secondary | ICD-10-CM | POA: Diagnosis not present

## 2024-02-05 DIAGNOSIS — I130 Hypertensive heart and chronic kidney disease with heart failure and stage 1 through stage 4 chronic kidney disease, or unspecified chronic kidney disease: Secondary | ICD-10-CM | POA: Diagnosis not present

## 2024-02-05 DIAGNOSIS — Z48812 Encounter for surgical aftercare following surgery on the circulatory system: Secondary | ICD-10-CM | POA: Diagnosis not present

## 2024-02-05 DIAGNOSIS — I5033 Acute on chronic diastolic (congestive) heart failure: Secondary | ICD-10-CM | POA: Diagnosis not present

## 2024-02-05 DIAGNOSIS — N1831 Chronic kidney disease, stage 3a: Secondary | ICD-10-CM | POA: Diagnosis not present

## 2024-02-05 DIAGNOSIS — E871 Hypo-osmolality and hyponatremia: Secondary | ICD-10-CM | POA: Diagnosis not present

## 2024-02-05 DIAGNOSIS — E119 Type 2 diabetes mellitus without complications: Secondary | ICD-10-CM | POA: Diagnosis not present

## 2024-02-05 DIAGNOSIS — I088 Other rheumatic multiple valve diseases: Secondary | ICD-10-CM | POA: Diagnosis not present

## 2024-02-05 NOTE — Telephone Encounter (Signed)
 Open in error

## 2024-02-05 NOTE — Telephone Encounter (Signed)
 [facility] [phone]

## 2024-02-05 NOTE — Telephone Encounter (Signed)
 [facility] [phone]

## 2024-02-06 ENCOUNTER — Ambulatory Visit: Payer: Medicare PPO | Admitting: Family Medicine

## 2024-02-06 ENCOUNTER — Inpatient Hospital Stay: Admitting: Family Medicine

## 2024-02-06 ENCOUNTER — Telehealth: Payer: Self-pay | Admitting: Family Medicine

## 2024-02-06 VITALS — BP 122/64 | HR 65 | Temp 98.1°F | Ht 61.5 in | Wt 144.0 lb

## 2024-02-06 DIAGNOSIS — E1140 Type 2 diabetes mellitus with diabetic neuropathy, unspecified: Secondary | ICD-10-CM

## 2024-02-06 DIAGNOSIS — E782 Mixed hyperlipidemia: Secondary | ICD-10-CM | POA: Diagnosis not present

## 2024-02-06 DIAGNOSIS — I130 Hypertensive heart and chronic kidney disease with heart failure and stage 1 through stage 4 chronic kidney disease, or unspecified chronic kidney disease: Secondary | ICD-10-CM

## 2024-02-06 DIAGNOSIS — K5909 Other constipation: Secondary | ICD-10-CM

## 2024-02-06 DIAGNOSIS — N1831 Chronic kidney disease, stage 3a: Secondary | ICD-10-CM

## 2024-02-06 DIAGNOSIS — R300 Dysuria: Secondary | ICD-10-CM | POA: Diagnosis not present

## 2024-02-06 DIAGNOSIS — E038 Other specified hypothyroidism: Secondary | ICD-10-CM | POA: Diagnosis not present

## 2024-02-06 DIAGNOSIS — Z794 Long term (current) use of insulin: Secondary | ICD-10-CM | POA: Diagnosis not present

## 2024-02-06 DIAGNOSIS — K219 Gastro-esophageal reflux disease without esophagitis: Secondary | ICD-10-CM | POA: Diagnosis not present

## 2024-02-06 LAB — POCT URINALYSIS DIP (CLINITEK)
Bilirubin, UA: NEGATIVE
Blood, UA: NEGATIVE
Glucose, UA: NEGATIVE mg/dL
Ketones, POC UA: NEGATIVE mg/dL
Leukocytes, UA: NEGATIVE
Nitrite, UA: NEGATIVE
POC PROTEIN,UA: NEGATIVE
Spec Grav, UA: 1.015 (ref 1.010–1.025)
Urobilinogen, UA: 0.2 U/dL
pH, UA: 6 (ref 5.0–8.0)

## 2024-02-06 NOTE — Progress Notes (Signed)
 Subjective:  Patient ID: Pamela [name], female    DOB: 12/11/1945  Age: 78 y.o. MRN: [id number]  Chief Complaint  Patient presents with   Medical Management of Chronic Issues    HPI: Diabetes:  Complications: Hypertension Glucose checking: Several times a day (CGM) (68-296). Sees Endocrinology.  Hypoglycemia: 2-3 weekly  Most recent A1C: 7.1% Current medications: Stopped farxiga  and toujeo  due to low blood sugars.  NovoLog  6 units before each meal, trulicity  0.75 weekly Last Eye Exam: UTD 12/24 Foot checks: several days per week..   Hyperlipidemia: Current medications: Atorvastatin  80 mg daily.   Hypertension: Complications: Diabetes, Hyperlipidemia. Current medications: Lisinopril  10 mg daily, carvedilol  12.5 mg one oral twice daily,  Aspirin  81 mg daily, stopped imdur .  150/80s. Took off furosemide  and started on hydrochlorothiazide 12.5 mg daily. Dr. Juventino Oppenheim.    Atrial fibrillation: off coumadin . On eliquis  5 mg twice daily and carvedilol  12.5 mg twice daily.    GERD: protonix  40 mg once daily.    Protein S Deficiency: ON eLIQUIS .   Hospital follow up from 01/22/2024: dehydration, recurrent urinary tract infection. Removed event monitor, but still waiting on report. Recurrent UTI. Amoxicillin . Still having some lower abdomen pain and BL buttock pain. Occasionally dysuria.  Stopped farxiga , imdur , diltiazem , toujeo . Decreased lisinopril  10 mg daily.   Insomnia: on ambien  5 mg before bed.   Takes senokot every other day now due to softness (mushy) stool.       04/17/2023    8:55 AM 03/30/2023    2:14 PM 12/14/2022    9:06 AM 02/15/2022    1:16 PM 01/24/2022    8:21 AM  Depression screen PHQ 2/9  Decreased Interest 1 0 0 0 0  Down, Depressed, Hopeless 0 0 0 0 0  PHQ - 2 Score 1 0 0 0 0  Altered sleeping 3      Tired, decreased energy 2      Change in appetite 0      Feeling bad or failure about yourself  0      Trouble concentrating 1       Moving slowly or fidgety/restless 1      Suicidal thoughts 0      PHQ-9 Score 8      Difficult doing work/chores Somewhat difficult            10/31/2023    3:50 PM  Fall Risk   Falls in the past year? 0  Number falls in past yr: 0  Injury with Fall? 0  Risk for fall due to : No Fall Risks  Follow up Falls evaluation completed    Patient Care Team: [name], MD as PCP - General (Internal Medicine) Jerryl Morin, DO as PCP - Cardiology (Cardiology) Deloria Fetch, MD as Consulting Physician (Oncology) Rockie Churchman, OD (Optometry) Ortho, Emerge (Specialist) Diamond Formica, MD as Consulting Physician (Pulmonary Disease) Prescott Brodie, MD (Inactive) as Consulting Physician (Otolaryngology) Tobb, Kardie, DO as Consulting Physician (Cardiology) Gwyndolyn Lerner, MD (Inactive) as Consulting Physician (Endocrinology) Stefan Edge, MD as Consulting Physician (Rheumatology) Armbruster, Lendon Queen, MD as Consulting Physician (Gastroenterology) Rockie Churchman, OD (Optometry)   Review of Systems  Constitutional:  Negative for chills, fatigue and fever.  HENT:  Negative for congestion, ear pain, rhinorrhea and sore throat.   Respiratory:  Negative for cough and shortness of breath.   Cardiovascular:  Negative for chest pain.  Gastrointestinal:  Positive for abdominal pain. Negative for constipation, diarrhea, nausea and vomiting.  Genitourinary:  Positive for dysuria and frequency. Negative for urgency.  Musculoskeletal:  Positive for back pain. Negative for myalgias.  Neurological:  Negative for dizziness, weakness, light-headedness and headaches.  Psychiatric/Behavioral:  Negative for dysphoric mood. The patient is not nervous/anxious.     Current Outpatient Medications on File Prior to Visit  Medication Sig Dispense Refill   acetaminophen  (TYLENOL ) 650 MG CR tablet Take 650-1,300 mg by mouth every 8 (eight) hours as needed for pain.     albuterol  (VENTOLIN  HFA) 108 (90  Base) MCG/ACT inhaler INHALE 1 TO 2 PUFFS INTO THE LUNGS EVERY 6 HOURS AS NEEDED FOR WHEEZING OR SHORTNESS OF BREATH 6.7 g 3   aspirin  EC (ASPIRIN  LOW DOSE) 81 MG tablet Take 1 tablet (81 mg total) by mouth daily. Swallow whole. 90 tablet 3   atorvastatin  (LIPITOR ) 80 MG tablet TAKE 1 TABLET(80 MG) BY MOUTH DAILY 90 tablet 3   carboxymethylcellulose (REFRESH PLUS) 0.5 % SOLN Place 1 drop into both eyes 3 (three) times daily as needed (dry eyes).     carvedilol  (COREG ) 12.5 MG tablet Take 1 tablet (12.5 mg total) by mouth 2 (two) times daily with a meal. 60 tablet 3   Cholecalciferol (VITAMIN D3) 50 MCG (2000 UT) TABS Take 4,000 Units by mouth in the morning and at bedtime.     cyanocobalamin  (VITAMIN B12) 1000 MCG tablet Take 1,000 mcg by mouth daily with lunch.     Dulaglutide  (TRULICITY ) 0.75 MG/0.5ML SOAJ Inject 0.75 mg into the skin once a week. (Patient taking differently: Inject 0.75 mg into the skin once a week. Inject on Tuesday.) 6 mL 3   ELIQUIS  5 MG TABS tablet TAKE 1 TABLET(5 MG) BY MOUTH TWICE DAILY 60 tablet 2   estradiol (ESTRACE) 0.1 MG/GM vaginal cream Place 1 Applicatorful vaginally 2 (two) times a week. As needed on Tues and Thurs.     ferrous sulfate  325 (65 FE) MG tablet Take 325 mg by mouth daily with lunch.     furosemide  (LASIX ) 20 MG tablet Take 1 tablet (20 mg total) by mouth daily as needed for fluid or edema.     GEMTESA  75 MG TABS Take 1 tablet (75 mg total) by mouth at bedtime. 90 tablet 3   insulin  aspart (NOVOLOG  FLEXPEN) 100 UNIT/ML FlexPen Max daily 30 units (Patient taking differently: Inject 6 Units into the skin 3 (three) times daily with meals. Sliding scale Max daily 30 units) 30 mL 3   magnesium  oxide (MAG-OX) 400 (240 Mg) MG tablet TAKE 2 TABLETS(800 MG) BY MOUTH TWICE DAILY (Patient taking differently: Take 800 mg by mouth 2 (two) times daily.) 120 tablet 2   Multiple Vitamins-Minerals (PRESERVISION AREDS PO) Take 1 capsule by mouth 2 (two) times daily.      nystatin cream (MYCOSTATIN) Apply 1 Application topically 4 (four) times daily as needed for dry skin (yeast infection).     ondansetron  (ZOFRAN -ODT) 8 MG disintegrating tablet Take 8 mg by mouth every 8 (eight) hours as needed for nausea or vomiting.      pantoprazole  (PROTONIX ) 40 MG tablet TAKE 1 TABLET(40 MG) BY MOUTH EVERY MORNING 90 tablet 1   pyridoxine  (B-6) 100 MG tablet Take 100 mg by mouth daily with lunch.     sennosides-docusate sodium  (SENOKOT-S) 8.6-50 MG tablet Take 1 tablet by mouth every other day.     sodium chloride  (OCEAN) 0.65 % SOLN nasal spray Place 1 spray into both nostrils as needed for congestion.     zolpidem  (  AMBIEN ) 5 MG tablet TAKE 1 TABLET(5 MG) BY MOUTH AT BEDTIME AS NEEDED FOR SLEEP (Patient taking differently: Take 5 mg by mouth at bedtime.) 30 tablet 5   No current facility-administered medications on file prior to visit.   Past Medical History:  Diagnosis Date   Anticoagulated on Coumadin     chronic--- managed by hematology/ oncology,   Asthma, mild intermittent    followed by pcp--- per pt last exacerbation winter 2021 w/ acute bronchitis   Bilateral leg cramps    Blood dyscrasia    Chronic constipation    CKD (chronic kidney disease), stage III (HCC)    Closed bimalleolar fracture of left ankle 02/26/2020   DDD (degenerative disc disease), cervical    w/ spondylosis,  per pt last steroid injection 06/ 2022   DDD (degenerative disc disease), lumbosacral    DVT (deep venous thrombosis) (HCC) 07/30/2013   Dyspnea    occasionally   Dysrhythmia    GERD (gastroesophageal reflux disease)    History of cardiac murmur as a child    History of DVT of lower extremity    left lower extremity in 1980s, fell when bowling   History of pulmonary embolus (PE) 1993   per pt left lung post op 2 wks cholecystectomy   History of rheumatic fever as a child    per last echo 12-31-2019 no valvular issues   History of TIA (transient ischemic attack)    2014 and  2018 or 2019,  per pt no residual   HTN (hypertension)    followed by pcp   Hyperlipidemia    Hypothyroidism    IDA (iron deficiency anemia)    Macular degeneration of both eyes    Mild obstructive sleep apnea    per pt dx 2017 tried to uses cpap but intolerant   Mixed incontinence urge and stress    urologist--- dr Reginal Capra   OA (osteoarthritis) 07/06/2018   Osteoporosis    PAF (paroxysmal atrial fibrillation) (HCC) 10/01/2014   cardiologist--- dr Chyrl Crawford tobb;   cardiac cath 02-18-2013 normal coronaries arteries, ef 50%, cath done since echo showed ef 30-35%; nucleat stress study 04/ 2020 normal , normal echo 04/ 2021,  event monitor 09-14-2020 rare ST/AT variable block   Pneumonia    PONV (postoperative nausea and vomiting)    Protein S deficiency (HCC)    followed by hemotology/ oncology-- dr d. Harles Lied ([facility]) dx 1980s;  prior DVT left lower leg 1980s and left lung PE 1993; chronic  coumadin  since 1980s   PVC's (premature ventricular contractions)    followed by cardiology   S/P cardiac catheterization 02/2013   Normal coronaries; low normal EF at 50%   Solitary pulmonary nodule on lung CT 02/06/2019   First noted 01/13/2014 > no change as of 12/21/2018   Spondylolisthesis, lumbar region 08/09/2018   Stroke (HCC)    TIA in 2018 or 2019   Transient ischemic attack 07/30/2013   Type 2 diabetes mellitus treated with insulin  (HCC)    endocrilogist--- whitney reardon NP     (03-10-2021  pt continuously checks blood sugar throughout the day w/ Libre, fasting sugar --- 69--200)   Wears glasses    Wears hearing aid in both ears    Past Surgical History:  Procedure Laterality Date   ABDOMINAL HYSTERECTOMY     BLEPHAROPLASTY     CARDIAC CATHETERIZATION  02/18/2013   @ [facility]  by Dr [place];  normal coronaries w/ preserved LVF, ef 50%;  previous cath 2001 normal ef 65%   CARPAL TUNNEL RELEASE Bilateral 1994   CATARACT EXTRACTION W/ INTRAOCULAR LENS IMPLANT Bilateral 2017    CHOLECYSTECTOMY     CHOLECYSTECTOMY, LAPAROSCOPIC  1993   COLONOSCOPY     EAR BIOPSY Left    FINGER SURGERY Left 2018   thumb   FOOT SURGERY     FOOT TENDON SURGERY Right    early 2000s   FRACTURE SURGERY Left 02/13/2020   left femur   HAND SURGERY Right 04/2022   HARDWARE REMOVAL Left 03/12/2021   Procedure: HARDWARE REMOVAL;  Surgeon: Janeth Medicus, MD;  Location: [facility];  Service: Orthopedics;  Laterality: Left;  60 MINS   JOINT REPLACEMENT     LEFT HEART CATH AND CORONARY ANGIOGRAPHY N/A 12/07/2023   Procedure: LEFT HEART CATH AND CORONARY ANGIOGRAPHY;  Surgeon: Odie Benne, MD;  Location: [facility] INVASIVE CV [facility];  Service: Cardiovascular;  Laterality: N/A;   LUMBAR LAMINECTOMY/DECOMPRESSION MICRODISCECTOMY N/A 12/22/2022   Procedure: Lumbar Two-Lumbar Three, Lumbar Three-Lumbar Four Lumbar Decompression;  Surgeon: Elna Haggis, MD;  Location: [facility] OR;  Service: Neurosurgery;  Laterality: N/A;  RM 20 3C   ORIF ANKLE FRACTURE Left 02/26/2020   Procedure: OPEN REDUCTION INTERNAL FIXATION (ORIF) ANKLE FRACTURE;  Surgeon: Janeth Medicus, MD;  Location: [facility] OR;  Service: Orthopedics;  Laterality: Left;  90 mins   TONSILLECTOMY AND ADENOIDECTOMY Bilateral    TOTAL KNEE ARTHROPLASTY Left 03/2005   TOTAL VAGINAL HYSTERECTOMY  1988   per pt still has ovaries    Family History  Problem Relation Age of Onset   Cirrhosis Mother    Antithrombin III deficiency Mother        multiple emboli   Ulcerative colitis Mother    Diabetes Father    Coronary artery disease Father    Heart attack Father    Hypertension Father    Kidney disease Father    Hypertension Sister    Diabetes Sister    Heart attack Sister        age 75    Protein S deficiency Daughter    Heart attack Brother    Hypertension Brother    Lung cancer Brother    Stroke Neg Hx    Colitis Neg Hx    Colon polyps Neg Hx    Esophageal cancer Neg Hx    Liver cancer Neg Hx     Pancreatic cancer Neg Hx    Rectal cancer Neg Hx    Stomach cancer Neg Hx    Breast cancer Neg Hx    Social History   Socioeconomic History   Marital status: Married    Spouse name: Drexel Gentles   Number of children: Not on file   Years of education: Not on file   Highest education level: Some college, no degree  Occupational History   Not on file  Tobacco Use   Smoking status: Never   Smokeless tobacco: Never  Vaping Use   Vaping status: Never Used  Substance and Sexual Activity   Alcohol use: No   Drug use: Never   Sexual activity: Not on file    Comment: Hysterectomy  Other Topics Concern   Not on file  Social History Narrative   Lives with spouse in [place].  2 grown daughters.   Retired Print production planner     Social Drivers of Corporate investment banker Strain: Low Risk  (10/22/2023)   Overall Financial Resource Strain (CARDIA)    Difficulty of  Paying Living Expenses: Not very hard  Food Insecurity: No Food Insecurity (01/23/2024)   Hunger Vital Sign    Worried About Running Out of Food in the Last Year: Never true    Ran Out of Food in the Last Year: Never true  Transportation Needs: No Transportation Needs (01/23/2024)   PRAPARE - Administrator, Civil Service (Medical): No    Lack of Transportation (Non-Medical): No  Physical Activity: Inactive (10/22/2023)   Exercise Vital Sign    Days of Exercise per Week: 0 days    Minutes of Exercise per Session: 30 min  Stress: No Stress Concern Present (10/22/2023)   [employer] of Occupational Health - Occupational Stress Questionnaire    Feeling of Stress : Only a little  Social Connections: Socially Integrated (01/23/2024)   Social Connection and Isolation Panel [NHANES]    Frequency of Communication with Friends and Family: More than three times a week    Frequency of Social Gatherings with Friends and Family: Patient declined    Attends Religious Services: More than 4 times per year    Active Member of  Clubs or Organizations: Patient declined    Attends Engineer, structural: More than 4 times per year    Marital Status: Married    Objective:  BP 122/64   Pulse 65   Temp 98.1 F (36.7 C)   Ht 5' 1.5" (1.562 m)   Wt 144 lb (65.3 kg)   SpO2 95%   BMI 26.77 kg/m      02/09/2024    9:40 AM 02/06/2024    8:14 AM 02/02/2024    8:57 AM  BP/Weight  Systolic BP 143 122 120  Diastolic BP 40 64 66  Wt. (Lbs)  144 142  BMI  26.77 kg/m2 27.73 kg/m2    Physical Exam Vitals reviewed.  Constitutional:      Appearance: Normal appearance. She is normal weight.  Neck:     Vascular: No carotid bruit.  Cardiovascular:     Rate and Rhythm: Normal rate. Rhythm irregular.     Heart sounds: Normal heart sounds.  Pulmonary:     Effort: Pulmonary effort is normal. No respiratory distress.     Breath sounds: Normal breath sounds.  Abdominal:     General: Abdomen is flat. Bowel sounds are normal.     Palpations: Abdomen is soft.     Tenderness: There is no abdominal tenderness.  Musculoskeletal:     Cervical back: Normal range of motion.  Neurological:     Mental Status: She is alert and oriented to person, place, and time.  Psychiatric:        Mood and Affect: Mood normal.        Behavior: Behavior normal.     Diabetic Foot Exam - Simple   No data filed      Lab Results  Component Value Date   WBC 6.8 01/31/2024   HGB 12.6 01/31/2024   HCT 38.5 01/31/2024   PLT 295 01/31/2024   GLUCOSE 124 (H) 01/31/2024   CHOL 125 01/31/2024   TRIG 41 01/31/2024   HDL 61 01/31/2024   LDLCALC 54 01/31/2024   ALT 27 01/31/2024   AST 29 01/31/2024   NA 134 01/31/2024   K 4.6 01/31/2024   CL 97 01/31/2024   CREATININE 0.95 01/31/2024   BUN 28 (H) 01/31/2024   CO2 22 01/31/2024   TSH 2.012 01/23/2024   INR 2.2 07/24/2023   HGBA1C 7.3 (H) 01/31/2024  MICROALBUR 10 06/15/2021      Assessment & Plan:  Hypertensive heart and chronic kidney disease with heart failure and stage  1 through stage 4 chronic kidney disease, or unspecified chronic kidney disease (HCC) Assessment & Plan: Well controlled.  No changes to medicines. Lisinopril  10 mg daily, carvedilol  12.5 mg one oral twice daily,  Aspirin  81 mg daily, stopped imdur .  Took off furosemide  and started on hydrochlorothiazide 12.5 mg daily.  Management per Dr. Juventino Oppenheim.  Continue to work on eating a healthy diet and exercise.     Gastro-esophageal reflux disease without esophagitis Assessment & Plan: Well-controlled.  Continue protonix  40 mg daily.     Chronic constipation Assessment & Plan: The current medical regimen is effective;  continue present plan and medications. Continue miralax  and sennakot.    CKD stage 3a, GFR 45-59 ml/min (HCC) Assessment & Plan: CKD STAGE 3A STABLE.    Subclinical hypothyroidism Assessment & Plan: Management per specialist.    Mixed hyperlipidemia Assessment & Plan: Well controlled.  No changes to medicines. Continue atorvastatin  80 mg before bed.  Continue to work on eating a healthy diet and exercise.  Labs reviewed   Dysuria Assessment & Plan: Check UA  Orders: -     POCT URINALYSIS DIP (CLINITEK)  Type 2 diabetes mellitus with diabetic neuropathy, with long-term current use of insulin  [facility]) Assessment & Plan: Control: fair. Sees endocrinology.  Recommend check sugars before meals and before bed. Has CGM. Recommend check feet daily. Recommend annual eye exams. Medicines: Stopped farxiga  and toujeo  due to low blood sugars.  NovoLog  6 units before each meal, trulicity  0.75 weekly Continue to work on eating a healthy diet and exercise.  Labs reviewed today      No orders of the defined types were placed in this encounter.   Orders Placed This Encounter  Procedures   POCT URINALYSIS DIP (CLINITEK)     Follow-up: Return in about 3 months (around 05/08/2024) for chronic follow up.   I,Marla I Leal-Borjas,acting as a scribe for [name], MD.,have documented all relevant documentation on the behalf of [name], MD,as directed by  [name], MD while in the presence of [name], MD.   An After Visit Summary was printed and given to the patient.  [name], MD [facility] [phone]

## 2024-02-06 NOTE — Telephone Encounter (Signed)
 [facility] Client Coordination Note Report

## 2024-02-09 ENCOUNTER — Ambulatory Visit

## 2024-02-09 ENCOUNTER — Encounter: Payer: Self-pay | Admitting: Family Medicine

## 2024-02-09 VITALS — BP 143/40 | HR 48

## 2024-02-09 DIAGNOSIS — I10 Essential (primary) hypertension: Secondary | ICD-10-CM

## 2024-02-09 DIAGNOSIS — R252 Cramp and spasm: Secondary | ICD-10-CM

## 2024-02-09 MED ORDER — LISINOPRIL 10 MG PO TABS: 20.0000 mg | ORAL_TABLET | Freq: Every day | ORAL | Status: DC

## 2024-02-09 NOTE — Progress Notes (Signed)
      Patient in today for BP check.  Dr Reinhold Carbine notified of results and evaluated patient herself.  Dr Reinhold Carbine recommended changing her Lisinopril  back to 20 mg every day.  Patient states that she has medication dose at home and does not need it sent to the pharmacy.  Patient has upcoming appointment with cardiology next week.  VS Today taken manually: BP (!) 143/40   Pulse (!) 48    Taken by her home cuff in the office: 139/54 HR 50     Davee Erm, LPN 09/81/19 14:78 AM

## 2024-02-11 ENCOUNTER — Encounter: Payer: Self-pay | Admitting: Family Medicine

## 2024-02-11 MED ORDER — BLOOD GLUCOSE MONITOR KIT: PACK | 0 refills | Status: DC

## 2024-02-11 MED ORDER — BLOOD GLUCOSE MONITOR KIT
PACK | 0 refills | Status: AC
Start: 2024-02-11 — End: ?

## 2024-02-11 NOTE — Assessment & Plan Note (Signed)
Well controlled.  Continue protonix 40 mg daily.

## 2024-02-11 NOTE — Assessment & Plan Note (Signed)
 The current medical regimen is effective;  continue present plan and medications.

## 2024-02-11 NOTE — Assessment & Plan Note (Signed)
Well controlled.  No changes to medicines. Continue atorvastatin 80 mg before bed.  Continue to work on eating a healthy diet and exercise.  Labs reviewed

## 2024-02-11 NOTE — Assessment & Plan Note (Signed)
 CKD STAGE 3A STABLE.

## 2024-02-11 NOTE — Assessment & Plan Note (Signed)
 Check UA

## 2024-02-11 NOTE — Assessment & Plan Note (Signed)
 Management per specialist.

## 2024-02-11 NOTE — Assessment & Plan Note (Signed)
 Control: fair. Sees endocrinology.  Recommend check sugars before meals and before bed. Has CGM. Recommend check feet daily. Recommend annual eye exams. Medicines: Stopped farxiga  and toujeo  due to low blood sugars.  NovoLog  6 units before each meal, trulicity  0.75 weekly Continue to work on eating a healthy diet and exercise.  Labs reviewed today

## 2024-02-11 NOTE — Assessment & Plan Note (Signed)
 Well controlled.  No changes to medicines. Lisinopril  10 mg daily, carvedilol  12.5 mg one oral twice daily,  Aspirin  81 mg daily, stopped imdur .  Took off furosemide  and started on hydrochlorothiazide 12.5 mg daily.  Management per Dr. Juventino Oppenheim.  Continue to work on eating a healthy diet and exercise.

## 2024-02-12 ENCOUNTER — Encounter: Payer: Self-pay | Admitting: Family Medicine

## 2024-02-12 ENCOUNTER — Other Ambulatory Visit: Payer: Self-pay

## 2024-02-12 ENCOUNTER — Ambulatory Visit (INDEPENDENT_AMBULATORY_CARE_PROVIDER_SITE_OTHER): Admitting: Family Medicine

## 2024-02-12 ENCOUNTER — Telehealth: Payer: Self-pay | Admitting: Nurse Practitioner

## 2024-02-12 VITALS — BP 140/60 | HR 62 | Temp 97.4°F | Resp 16 | Ht 61.5 in | Wt 143.0 lb

## 2024-02-12 DIAGNOSIS — R350 Frequency of micturition: Secondary | ICD-10-CM | POA: Insufficient documentation

## 2024-02-12 DIAGNOSIS — R109 Unspecified abdominal pain: Secondary | ICD-10-CM | POA: Diagnosis not present

## 2024-02-12 DIAGNOSIS — M25572 Pain in left ankle and joints of left foot: Secondary | ICD-10-CM | POA: Diagnosis not present

## 2024-02-12 DIAGNOSIS — M12572 Traumatic arthropathy, left ankle and foot: Secondary | ICD-10-CM | POA: Insufficient documentation

## 2024-02-12 DIAGNOSIS — Z1283 Encounter for screening for malignant neoplasm of skin: Secondary | ICD-10-CM | POA: Diagnosis not present

## 2024-02-12 DIAGNOSIS — M19172 Post-traumatic osteoarthritis, left ankle and foot: Secondary | ICD-10-CM | POA: Diagnosis not present

## 2024-02-12 DIAGNOSIS — M79672 Pain in left foot: Secondary | ICD-10-CM | POA: Diagnosis not present

## 2024-02-12 DIAGNOSIS — R1012 Left upper quadrant pain: Secondary | ICD-10-CM

## 2024-02-12 DIAGNOSIS — N3001 Acute cystitis with hematuria: Secondary | ICD-10-CM

## 2024-02-12 DIAGNOSIS — I10 Essential (primary) hypertension: Secondary | ICD-10-CM

## 2024-02-12 DIAGNOSIS — Z1231 Encounter for screening mammogram for malignant neoplasm of breast: Secondary | ICD-10-CM

## 2024-02-12 HISTORY — DX: Frequency of micturition: R35.0

## 2024-02-12 HISTORY — DX: Traumatic arthropathy, left ankle and foot: M12.572

## 2024-02-12 HISTORY — DX: Left upper quadrant pain: R10.12

## 2024-02-12 HISTORY — DX: Unspecified abdominal pain: R10.9

## 2024-02-12 HISTORY — DX: Encounter for screening for malignant neoplasm of skin: Z12.83

## 2024-02-12 LAB — POCT URINALYSIS DIP (CLINITEK)
Bilirubin, UA: NEGATIVE
Glucose, UA: NEGATIVE mg/dL
Ketones, POC UA: NEGATIVE mg/dL
Nitrite, UA: NEGATIVE
POC PROTEIN,UA: NEGATIVE
Spec Grav, UA: 1.01 (ref 1.010–1.025)
Urobilinogen, UA: 0.2 U/dL
pH, UA: 6 (ref 5.0–8.0)

## 2024-02-12 MED ORDER — LISINOPRIL 20 MG PO TABS: 20.0000 mg | ORAL_TABLET | Freq: Every day | ORAL | 1 refills | Status: DC

## 2024-02-12 MED ORDER — CIPROFLOXACIN HCL 500 MG PO TABS: 500.0000 mg | ORAL_TABLET | Freq: Two times a day (BID) | ORAL | 0 refills | Status: AC

## 2024-02-12 NOTE — Assessment & Plan Note (Addendum)
 Hematuria, flank pain, and urinary frequency suggest significant infection. Differential includes possible kidney stone. CT urogram planned to evaluate for stones. - Order CT urogram stat to evaluate for kidney stones. - Send urine for culture. - Prescribe ciprofloxacin  and send prescription to [facility] in [place]. - Hold one dose of Eliquis  until further evaluation of bleeding cause. - increase fluids - encourage wiping from front to back and not holding urine for more than 4 hours. - Finish full course of antibiotics

## 2024-02-12 NOTE — Telephone Encounter (Signed)
   Patient Name: Pamela [name]  DOB: 1945-10-09 MRN: [id number]  Primary Cardiologist: Kardie Tobb, DO  Received secure chat message from patient's PCP stating, "Patient is having gross hematuria and working her up for nephrolithiasis. May I hold eliquis  and if so how long? 24-48 hours. She has atrial fibrillation and protein s deficiency."    Reviewed with PharmD, who agrees, from a cardiology standpoint, okay to hold anticoagulation for 48 hours and resume as soon as safely possible. Would also recommend input from hematology. She has a CHA2DS2-VASc score of 9, with history of provoked DVT/PE, which places her at high risk.  I will route this message to Dr. Emmette Harms, primary cardiologist, so that she is also aware.   Jude Norton, NP 02/12/2024, 12:27 PM

## 2024-02-12 NOTE — Assessment & Plan Note (Signed)
 Acute LLQ pain Differentials include pancreatitis, splenic or gastric disorders - orders placed for imaging and labs to be drawn

## 2024-02-12 NOTE — Progress Notes (Signed)
 Acute Office Visit  Subjective:    Patient ID: Pamela [name], female    DOB: [birth year], 78 y.o.   MRN: [id number]  Chief Complaint  Patient presents with   Hematuria    Discussed the use of AI scribe software for clinical note transcription with the patient, who gave verbal consent to proceed.  History of Present Illness   Pamela [name] is a 78 year old female with hypertension who presents with symptoms of a urinary tract infection and elevated blood pressure. She saw reddish, orange color in the urine. She feels is better today, but she would like to be check for infection.  She has been experiencing hematuria since yesterday, with the bleeding occurring twice before it started to lighten. She also has flank pain and increased frequency of urination. No dysuria, diarrhea, or hematochezia. She has not experienced any fevers or chills but mentions feeling cold frequently. She has previously tolerated ciprofloxacin  well for urinary tract infections.  Her blood pressure was elevated today, despite taking her medication, lisinopril , at a dose of 20 mg. The increase in blood pressure has been noted since Friday, and she was previously on a 10 mg dose. She is requesting a refill on her lisinopril .   She has rough spots on her face and neck, which are not new but have been spreading. She does not currently see a dermatologist but has a family history of cancer.  Her family history includes a brother with lung cancer and a sister with breast cancer. She has a mammogram scheduled for August 29. She is not a smoker but has been exposed to secondhand smoke from family members who smoked.       Past Medical History:  Diagnosis Date   Anticoagulated on Coumadin     chronic--- managed by hematology/ oncology,   Asthma, mild intermittent    followed by pcp--- per pt last exacerbation winter 2021 w/ acute bronchitis   Bilateral leg cramps    Blood dyscrasia     Chronic constipation    CKD (chronic kidney disease), stage III (HCC)    Closed bimalleolar fracture of left ankle 02/26/2020   DDD (degenerative disc disease), cervical    w/ spondylosis,  per pt last steroid injection 06/ 2022   DDD (degenerative disc disease), lumbosacral    DVT (deep venous thrombosis) (HCC) 07/30/2013   Dyspnea    occasionally   Dysrhythmia    GERD (gastroesophageal reflux disease)    History of cardiac murmur as a child    History of DVT of lower extremity    left lower extremity in 1980s, fell when bowling   History of pulmonary embolus (PE) 1993   per pt left lung post op 2 wks cholecystectomy   History of rheumatic fever as a child    per last echo 12-31-2019 no valvular issues   History of TIA (transient ischemic attack)    2014 and 2018 or 2019,  per pt no residual   HTN (hypertension)    followed by pcp   Hyperlipidemia    Hypothyroidism    IDA (iron deficiency anemia)    Macular degeneration of both eyes    Mild obstructive sleep apnea    per pt dx 2017 tried to uses cpap but intolerant   Mixed incontinence urge and stress    urologist--- dr Reginal Capra   OA (osteoarthritis) 07/06/2018   Osteoporosis    PAF (paroxysmal atrial fibrillation) (HCC) 10/01/2014   cardiologist--- dr Chyrl Crawford tobb;  cardiac cath 02-18-2013 normal coronaries arteries, ef 50%, cath done since echo showed ef 30-35%; nucleat stress study 04/ 2020 normal , normal echo 04/ 2021,  event monitor 09-14-2020 rare ST/AT variable block   Pneumonia    PONV (postoperative nausea and vomiting)    Protein S deficiency (HCC)    followed by hemotology/ oncology-- dr d. Harles Lied ([facility]) dx 1980s;  prior DVT left lower leg 1980s and left lung PE 1993; chronic  coumadin  since 1980s   PVC's (premature ventricular contractions)    followed by cardiology   S/P cardiac catheterization 02/2013   Normal coronaries; low normal EF at 50%   Solitary pulmonary nodule on lung CT 02/06/2019    First noted 01/13/2014 > no change as of 12/21/2018   Spondylolisthesis, lumbar region 08/09/2018   Stroke (HCC)    TIA in 2018 or 2019   Transient ischemic attack 07/30/2013   Type 2 diabetes mellitus treated with insulin  (HCC)    endocrilogist--- whitney reardon NP     (03-10-2021  pt continuously checks blood sugar throughout the day w/ Libre, fasting sugar --- 69--200)   Wears glasses    Wears hearing aid in both ears     Past Surgical History:  Procedure Laterality Date   ABDOMINAL HYSTERECTOMY     BLEPHAROPLASTY     CARDIAC CATHETERIZATION  02/18/2013   @ [facility]  by Dr Sw[place];  normal coronaries w/ preserved LVF, ef 50%;   previous cath 2001 normal ef 65%   CARPAL TUNNEL RELEASE Bilateral 1994   CATARACT EXTRACTION W/ INTRAOCULAR LENS IMPLANT Bilateral 2017   CHOLECYSTECTOMY     CHOLECYSTECTOMY, LAPAROSCOPIC  1993   COLONOSCOPY     EAR BIOPSY Left    FINGER SURGERY Left 2018   thumb   FOOT SURGERY     FOOT TENDON SURGERY Right    early 2000s   FRACTURE SURGERY Left 02/13/2020   left femur   HAND SURGERY Right 04/2022   HARDWARE REMOVAL Left 03/12/2021   Procedure: HARDWARE REMOVAL;  Surgeon: Janeth Medicus, MD;  Location: [facility];  Service: Orthopedics;  Laterality: Left;  60 MINS   JOINT REPLACEMENT     LEFT HEART CATH AND CORONARY ANGIOGRAPHY N/A 12/07/2023   Procedure: LEFT HEART CATH AND CORONARY ANGIOGRAPHY;  Surgeon: Odie Benne, MD;  Location: [facility] IN[facility];  Service: Cardiovascular;  Laterality: N/A;   LUMBAR LAMINECTOMY/DECOMPRESSION MICRODISCECTOMY N/A 12/22/2022   Procedure: Lumbar Two-Lumbar Three, Lumbar Three-Lumbar Four Lumbar Decompression;  Surgeon: Elna Haggis, MD;  Location: [facility] OR;  Service: Neurosurgery;  Laterality: N/A;  RM 20 3C   ORIF ANKLE FRACTURE Left 02/26/2020   Procedure: OPEN REDUCTION INTERNAL FIXATION (ORIF) ANKLE FRACTURE;  Surgeon: Janeth Medicus, MD;  Location: [facility] OR;  Service: Orthopedics;   Laterality: Left;  90 mins   TONSILLECTOMY AND ADENOIDECTOMY Bilateral    TOTAL KNEE ARTHROPLASTY Left 03/2005   TOTAL VAGINAL HYSTERECTOMY  1988   per pt still has ovaries    Family History  Problem Relation Age of Onset   Cirrhosis Mother    Antithrombin III deficiency Mother        multiple emboli   Ulcerative colitis Mother    Diabetes Father    Coronary artery disease Father    Heart attack Father    Hypertension Father    Kidney disease Father    Hypertension Sister    Diabetes Sister    Heart attack Sister  age 67    Protein S deficiency Daughter    Heart attack Brother    Hypertension Brother    Lung cancer Brother    Stroke Neg Hx    Colitis Neg Hx    Colon polyps Neg Hx    Esophageal cancer Neg Hx    Liver cancer Neg Hx    Pancreatic cancer Neg Hx    Rectal cancer Neg Hx    Stomach cancer Neg Hx    Breast cancer Neg Hx     Social History   Socioeconomic History   Marital status: Married    Spouse name: Drexel Gentles   Number of children: Not on file   Years of education: Not on file   Highest education level: Some college, no degree  Occupational History   Not on file  Tobacco Use   Smoking status: Never   Smokeless tobacco: Never  Vaping Use   Vaping status: Never Used  Substance and Sexual Activity   Alcohol use: No   Drug use: Never   Sexual activity: Not on file    Comment: Hysterectomy  Other Topics Concern   Not on file  Social History Narrative   Lives with spouse in [place].  2 grown daughters.   Retired Print production planner     Social Drivers of Corporate investment banker Strain: Low Risk  (10/22/2023)   Overall Financial Resource Strain (CARDIA)    Difficulty of Paying Living Expenses: Not very hard  Food Insecurity: No Food Insecurity (01/23/2024)   Hunger Vital Sign    Worried About Running Out of Food in the Last Year: Never true    Ran Out of Food in the Last Year: Never true  Transportation Needs: No Transportation Needs  (01/23/2024)   PRAPARE - Administrator, Civil Service (Medical): No    Lack of Transportation (Non-Medical): No  Physical Activity: Inactive (10/22/2023)   Exercise Vital Sign    Days of Exercise per Week: 0 days    Minutes of Exercise per Session: 30 min  Stress: No Stress Concern Present (10/22/2023)   [employer] of Occupational Health - Occupational Stress Questionnaire    Feeling of Stress : Only a little  Social Connections: Socially Integrated (01/23/2024)   Social Connection and Isolation Panel [NHANES]    Frequency of Communication with Friends and Family: More than three times a week    Frequency of Social Gatherings with Friends and Family: Patient declined    Attends Religious Services: More than 4 times per year    Active Member of [employer] or Organizations: Patient declined    Attends Engineer, structural: More than 4 times per year    Marital Status: Married  Catering manager Violence: Not At Risk (01/23/2024)   Humiliation, Afraid, Rape, and Kick questionnaire    Fear of Current or Ex-Partner: No    Emotionally Abused: No    Physically Abused: No    Sexually Abused: No    Outpatient Medications Prior to Visit  Medication Sig Dispense Refill   acetaminophen  (TYLENOL ) 650 MG CR tablet Take 650-1,300 mg by mouth every 8 (eight) hours as needed for pain.     albuterol  (VENTOLIN  HFA) 108 (90 Base) MCG/ACT inhaler INHALE 1 TO 2 PUFFS INTO THE LUNGS EVERY 6 HOURS AS NEEDED FOR WHEEZING OR SHORTNESS OF BREATH 6.7 g 3   aspirin  EC (ASPIRIN  LOW DOSE) 81 MG tablet Take 1 tablet (81 mg total) by mouth daily. Swallow whole. 90  tablet 3   atorvastatin  (LIPITOR ) 80 MG tablet TAKE 1 TABLET(80 MG) BY MOUTH DAILY 90 tablet 3   blood glucose meter kit and supplies KIT Dispense based on patient and insurance preference. Use up to four times daily as directed. 1 each 0   carboxymethylcellulose (REFRESH PLUS) 0.5 % SOLN Place 1 drop into both eyes 3 (three) times daily  as needed (dry eyes).     carvedilol  (COREG ) 12.5 MG tablet Take 1 tablet (12.5 mg total) by mouth 2 (two) times daily with a meal. 60 tablet 3   Cholecalciferol (VITAMIN D3) 50 MCG (2000 UT) TABS Take 4,000 Units by mouth in the morning and at bedtime.     cyanocobalamin  (VITAMIN B12) 1000 MCG tablet Take 1,000 mcg by mouth daily with lunch.     Dulaglutide  (TRULICITY ) 0.75 MG/0.5ML SOAJ Inject 0.75 mg into the skin once a week. (Patient taking differently: Inject 0.75 mg into the skin once a week. Inject on Tuesday.) 6 mL 3   ELIQUIS  5 MG TABS tablet TAKE 1 TABLET(5 MG) BY MOUTH TWICE DAILY 60 tablet 2   estradiol (ESTRACE) 0.1 MG/GM vaginal cream Place 1 Applicatorful vaginally 2 (two) times a week. As needed on Tues and Thurs.     ferrous sulfate  325 (65 FE) MG tablet Take 325 mg by mouth daily with lunch.     furosemide  (LASIX ) 20 MG tablet Take 1 tablet (20 mg total) by mouth daily as needed for fluid or edema.     GEMTESA  75 MG TABS Take 1 tablet (75 mg total) by mouth at bedtime. 90 tablet 3   insulin  aspart (NOVOLOG  FLEXPEN) 100 UNIT/ML FlexPen Max daily 30 units (Patient taking differently: Inject 6 Units into the skin 3 (three) times daily with meals. Sliding scale Max daily 30 units) 30 mL 3   magnesium  oxide (MAG-OX) 400 (240 Mg) MG tablet TAKE 2 TABLETS(800 MG) BY MOUTH TWICE DAILY (Patient taking differently: Take 800 mg by mouth 2 (two) times daily.) 120 tablet 2   Multiple Vitamins-Minerals (PRESERVISION AREDS PO) Take 1 capsule by mouth 2 (two) times daily.     nystatin cream (MYCOSTATIN) Apply 1 Application topically 4 (four) times daily as needed for dry skin (yeast infection).     ondansetron  (ZOFRAN -ODT) 8 MG disintegrating tablet Take 8 mg by mouth every 8 (eight) hours as needed for nausea or vomiting.      pantoprazole  (PROTONIX ) 40 MG tablet TAKE 1 TABLET(40 MG) BY MOUTH EVERY MORNING 90 tablet 1   pyridoxine  (B-6) 100 MG tablet Take 100 mg by mouth daily with lunch.      sennosides-docusate sodium  (SENOKOT-S) 8.6-50 MG tablet Take 1 tablet by mouth every other day.     sodium chloride  (OCEAN) 0.65 % SOLN nasal spray Place 1 spray into both nostrils as needed for congestion.     zolpidem  (AMBIEN ) 5 MG tablet TAKE 1 TABLET(5 MG) BY MOUTH AT BEDTIME AS NEEDED FOR SLEEP (Patient taking differently: Take 5 mg by mouth at bedtime.) 30 tablet 5   lisinopril  (ZESTRIL ) 10 MG tablet Take 2 tablets (20 mg total) by mouth daily.     lisinopril  (ZESTRIL ) 20 MG tablet Take 20 mg by mouth daily.     No facility-administered medications prior to visit.    Allergies  Allergen Reactions   Loxapine Succinate Hives   Macrodantin  [Nitrofurantoin  Macrocrystal] Other (See Comments)    Pulmonary fibrosis   Naproxen Sodium Anaphylaxis and Hives   Telithromycin Nausea And Vomiting and  Rash    Other Reaction(s): Unknown   Amoxapine And Related Hives   Darvon Nausea And Vomiting   Levothyroxine  Sodium Photosensitivity and Other (See Comments)    Blurry and double vision   Belsomra [Suvorexant] Other (See Comments)    "Nightmares"   Cymbalta [Duloxetine Hcl] Other (See Comments)    "Blackouts"   Duloxetine    Gabapentin  Other (See Comments)    Blurry vision   Lyrica  [Pregabalin ]     Makes her sedated   Naproxen Hives    Other Reaction(s): hives and GI upset   Ozempic  (0.25 Or 0.5 Mg-Dose) [Semaglutide (0.25 Or 0.5mg -Dos)] Diarrhea   Semaglutide  Diarrhea    Rybelsus    Amoxicillin  Rash    Other Reaction(s): hives and GI upset   Propoxyphene Nausea And Vomiting    Other Reaction(s): hives and GI upset    Review of Systems  Constitutional:  Negative for chills, diaphoresis, fatigue and fever.  HENT:  Negative for congestion, ear pain and sinus pain.   Respiratory:  Negative for cough and shortness of breath.   Cardiovascular:  Negative for chest pain.  Gastrointestinal:  Negative for abdominal pain, constipation, diarrhea, nausea and vomiting.  Genitourinary:  Positive  for flank pain, frequency, hematuria and urgency. Negative for dysuria.  Musculoskeletal:  Negative for arthralgias.  Neurological:  Negative for weakness and headaches.  Psychiatric/Behavioral:  Negative for dysphoric mood. The patient is not nervous/anxious.        Objective:         02/12/2024   12:22 PM 02/12/2024   11:28 AM 02/09/2024    9:40 AM  Vitals with BMI  Height  5' 1.5"   Weight  143 lbs   BMI  26.59   Systolic 140 142 161  Diastolic 60 60 40  Pulse  62 48    No data found.   Physical Exam Vitals reviewed.  Constitutional:      General: She is not in acute distress.    Appearance: Normal appearance. She is not ill-appearing.  Eyes:     Conjunctiva/sclera: Conjunctivae normal.  Cardiovascular:     Rate and Rhythm: Normal rate and regular rhythm.     Heart sounds: Normal heart sounds.  Pulmonary:     Effort: Pulmonary effort is normal.     Breath sounds: Normal breath sounds.  Abdominal:     General: Bowel sounds are normal.     Palpations: Abdomen is soft.     Tenderness: There is abdominal tenderness in the left upper quadrant.  Neurological:     Mental Status: She is alert. Mental status is at baseline.  Psychiatric:        Mood and Affect: Mood normal.        Behavior: Behavior normal.     Health Maintenance Due  Topic Date Due   Zoster Vaccines- Shingrix (1 of 2) 09/18/1964   DTaP/Tdap/Td (2 - Td or Tdap) 11/15/2023   COVID-19 Vaccine (8 - Pfizer risk 2024-25 season) 01/14/2024   Medicare Annual Wellness (AWV)  03/29/2024    There are no preventive care reminders to display for this patient.   Lab Results  Component Value Date   TSH 2.012 01/23/2024   Lab Results  Component Value Date   WBC 6.8 01/31/2024   HGB 12.6 01/31/2024   HCT 38.5 01/31/2024   MCV 97 01/31/2024   PLT 295 01/31/2024   Lab Results  Component Value Date   NA 134 01/31/2024   K 4.6 01/31/2024  CO2 22 01/31/2024   GLUCOSE 124 (H) 01/31/2024   BUN 28 (H)  01/31/2024   CREATININE 0.95 01/31/2024   BILITOT 0.4 01/31/2024   ALKPHOS 122 (H) 01/31/2024   AST 29 01/31/2024   ALT 27 01/31/2024   PROT 6.1 01/31/2024   ALBUMIN  3.9 01/31/2024   CALCIUM  9.5 01/31/2024   ANIONGAP 10 01/25/2024   EGFR 61 01/31/2024   GFR 41.53 (L) 09/27/2023   Lab Results  Component Value Date   CHOL 125 01/31/2024   Lab Results  Component Value Date   HDL 61 01/31/2024   Lab Results  Component Value Date   LDLCALC 54 01/31/2024   Lab Results  Component Value Date   TRIG 41 01/31/2024   Lab Results  Component Value Date   CHOLHDL 2.0 01/31/2024   Lab Results  Component Value Date   HGBA1C 7.3 (H) 01/31/2024       Assessment & Plan:  Acute cystitis with hematuria Assessment & Plan: Hematuria, flank pain, and urinary frequency suggest significant infection. Differential includes possible kidney stone. CT urogram planned to evaluate for stones. - Order CT urogram stat to evaluate for kidney stones. - Send urine for culture. - Prescribe ciprofloxacin  and send prescription to [facility] in [place]. - Hold one dose of Eliquis  until further evaluation of bleeding cause. - increase fluids - encourage wiping from front to back and not holding urine for more than 4 hours. - Finish full course of antibiotics  Orders: -     Ciprofloxacin  HCl; Take 1 tablet (500 mg total) by mouth 2 (two) times daily for 7 days.  Dispense: 14 tablet; Refill: 0 -     Urine Culture  Uncontrolled hypertension Assessment & Plan: Blood pressure elevated during visit. Currently on lisinopril , recently increased from 10 mg to 20 mg. Scheduled to follow up with cardiologist for further management. BP Readings from Last 3 Encounters:  02/12/24 (!) 140/60  02/09/24 (!) 143/40  02/06/24 122/64   - Refill lisinopril  prescription. - Follow up with cardiologist on June 11 for further blood pressure management.  Orders: -     Lisinopril ; Take 1 tablet (20 mg total) by mouth  daily.  Dispense: 90 tablet; Refill: 1 -     Refer to [facility] [phone])  Urinary frequency Assessment & Plan: UA - positive Urine dipstick shows negative for nitrites, bacteria, glucose, protein, ketones, urobilinogen, positive for leukocytes, red blood cells.  - urine culture sent  Orders: -     POCT URINALYSIS DIP (CLINITEK)  Flank pain Assessment & Plan: Acute flank pain with hematuria - Rule out kidney stone - order CT renal study and labs - Await labs/testing for assessment and recommendations   Orders: -     CT RENAL STONE STUDY  Acute LUQ pain Assessment & Plan: Acute LLQ pain Differentials include pancreatitis, splenic or gastric disorders - orders placed for imaging and labs to be drawn  Orders: -     CT RENAL STONE STUDY -     Comprehensive metabolic panel with GFR -     CBC with Differential/Platelet  Skin cancer screening Assessment & Plan: Rough spots on face and neck, spreading. Concern for potential skin cancer, especially given family history. - Refer to dermatologist for evaluation of skin lesions. - would like to see a provider for regular skin cancer screenings.   Orders: -     Ambulatory referral to Dermatology   Meds ordered this encounter  Medications   lisinopril  (ZESTRIL ) 20  MG tablet    Sig: Take 1 tablet (20 mg total) by mouth daily.    Dispense:  90 tablet    Refill:  1   ciprofloxacin  (CIPRO ) 500 MG tablet    Sig: Take 1 tablet (500 mg total) by mouth 2 (two) times daily for 7 days.    Dispense:  14 tablet    Refill:  0    Orders Placed This Encounter  Procedures   Urine Culture   CT RENAL STONE STUDY   Comprehensive metabolic panel with GFR   CBC with Differential   Refer to [facility] ([id number])   Ambulatory referral to Dermatology   POCT URINALYSIS DIP (CLINITEK)     Follow-up: Return in about 6 months (around 08/13/2024).  An After Visit Summary was printed and given to the  patient.  Delford Felling, FNP [facility] [phone]

## 2024-02-12 NOTE — Telephone Encounter (Signed)
Called patient made appointment.

## 2024-02-12 NOTE — Assessment & Plan Note (Signed)
 Blood pressure elevated during visit. Currently on lisinopril , recently increased from 10 mg to 20 mg. Scheduled to follow up with cardiologist for further management. BP Readings from Last 3 Encounters:  02/12/24 (!) 140/60  02/09/24 (!) 143/40  02/06/24 122/64   - Refill lisinopril  prescription. - Follow up with cardiologist on June 11 for further blood pressure management.

## 2024-02-12 NOTE — Assessment & Plan Note (Signed)
 Rough spots on face and neck, spreading. Concern for potential skin cancer, especially given family history. - Refer to dermatologist for evaluation of skin lesions. - would like to see a provider for regular skin cancer screenings.

## 2024-02-12 NOTE — Assessment & Plan Note (Signed)
 UA - positive Urine dipstick shows negative for nitrites, bacteria, glucose, protein, ketones, urobilinogen, positive for leukocytes, red blood cells.  - urine culture sent

## 2024-02-12 NOTE — Assessment & Plan Note (Signed)
>>  ASSESSMENT AND PLAN FOR HTN (HYPERTENSION) WRITTEN ON 02/12/2024  2:25 PM BY JACOBS, DINA M, FNP  Blood pressure elevated during visit. Currently on lisinopril , recently increased from 10 mg to 20 mg. Scheduled to follow up with cardiologist for further management. BP Readings from Last 3 Encounters:  02/12/24 (!) 140/60  02/09/24 (!) 143/40  02/06/24 122/64   - Refill lisinopril  prescription. - Follow up with cardiologist on June 11 for further blood pressure management.

## 2024-02-12 NOTE — Assessment & Plan Note (Signed)
 Acute flank pain with hematuria - Rule out kidney stone - order CT renal study and labs - Await labs/testing for assessment and recommendations

## 2024-02-13 ENCOUNTER — Ambulatory Visit: Payer: Self-pay | Admitting: Family Medicine

## 2024-02-13 ENCOUNTER — Ambulatory Visit (HOSPITAL_BASED_OUTPATIENT_CLINIC_OR_DEPARTMENT_OTHER)
Admission: RE | Admit: 2024-02-13 | Discharge: 2024-02-13 | Discharge status: Home / Self Care | Source: Ambulatory Visit | Attending: Family Medicine | Admitting: Family Medicine

## 2024-02-13 DIAGNOSIS — I4729 Other ventricular tachycardia: Secondary | ICD-10-CM

## 2024-02-13 DIAGNOSIS — R002 Palpitations: Secondary | ICD-10-CM

## 2024-02-13 DIAGNOSIS — Z90710 Acquired absence of both cervix and uterus: Secondary | ICD-10-CM | POA: Diagnosis not present

## 2024-02-13 DIAGNOSIS — R319 Hematuria, unspecified: Secondary | ICD-10-CM

## 2024-02-13 DIAGNOSIS — R55 Syncope and collapse: Secondary | ICD-10-CM | POA: Diagnosis not present

## 2024-02-13 DIAGNOSIS — N3289 Other specified disorders of bladder: Secondary | ICD-10-CM | POA: Diagnosis not present

## 2024-02-13 LAB — COMPREHENSIVE METABOLIC PANEL WITH GFR
ALT: 27 IU/L (ref 0–32)
AST: 25 IU/L (ref 0–40)
Albumin: 3.8 g/dL (ref 3.8–4.8)
Alkaline Phosphatase: 111 IU/L (ref 44–121)
BUN/Creatinine Ratio: 28 (ref 12–28)
BUN: 27 mg/dL (ref 8–27)
Bilirubin Total: 0.4 mg/dL (ref 0.0–1.2)
CO2: 24 mmol/L (ref 20–29)
Calcium: 8.9 mg/dL (ref 8.7–10.3)
Chloride: 97 mmol/L (ref 96–106)
Creatinine, Ser: 0.96 mg/dL (ref 0.57–1.00)
Globulin, Total: 2.1 g/dL (ref 1.5–4.5)
Glucose: 180 mg/dL — ABNORMAL HIGH (ref 70–99)
Potassium: 4.9 mmol/L (ref 3.5–5.2)
Sodium: 133 mmol/L — ABNORMAL LOW (ref 134–144)
Total Protein: 5.9 g/dL — ABNORMAL LOW (ref 6.0–8.5)
eGFR: 60 mL/min/{1.73_m2} (ref >59)

## 2024-02-13 LAB — CBC WITH DIFFERENTIAL/PLATELET
Basophils Absolute: 0 10*3/uL (ref 0.0–0.2)
Basos: 1 %
EOS (ABSOLUTE): 0.1 10*3/uL (ref 0.0–0.4)
Eos: 1 %
Hematocrit: 35.4 % (ref 34.0–46.6)
Hemoglobin: 11.3 g/dL (ref 11.1–15.9)
Immature Grans (Abs): 0 10*3/uL (ref 0.0–0.1)
Immature Granulocytes: 0 %
Lymphocytes Absolute: 1.2 10*3/uL (ref 0.7–3.1)
Lymphs: 21 %
MCH: 30.9 pg (ref 26.6–33.0)
MCHC: 31.9 g/dL (ref 31.5–35.7)
MCV: 97 fL (ref 79–97)
Monocytes Absolute: 0.6 10*3/uL (ref 0.1–0.9)
Monocytes: 11 %
Neutrophils Absolute: 3.7 10*3/uL (ref 1.4–7.0)
Neutrophils: 66 %
Platelets: 243 10*3/uL (ref 150–450)
RBC: 3.66 x10E6/uL — ABNORMAL LOW (ref 3.77–5.28)
RDW: 12.3 % (ref 11.7–15.4)
WBC: 5.6 10*3/uL (ref 3.4–10.8)

## 2024-02-14 ENCOUNTER — Ambulatory Visit: Attending: Nurse Practitioner | Admitting: Nurse Practitioner

## 2024-02-14 ENCOUNTER — Telehealth: Payer: Self-pay

## 2024-02-14 ENCOUNTER — Encounter: Payer: Self-pay | Admitting: Nurse Practitioner

## 2024-02-14 VITALS — BP 122/60 | HR 66 | Ht 60.0 in | Wt 144.6 lb

## 2024-02-14 DIAGNOSIS — I491 Atrial premature depolarization: Secondary | ICD-10-CM | POA: Diagnosis not present

## 2024-02-14 DIAGNOSIS — R55 Syncope and collapse: Secondary | ICD-10-CM

## 2024-02-14 DIAGNOSIS — I951 Orthostatic hypotension: Secondary | ICD-10-CM | POA: Diagnosis not present

## 2024-02-14 DIAGNOSIS — I493 Ventricular premature depolarization: Secondary | ICD-10-CM | POA: Diagnosis not present

## 2024-02-14 DIAGNOSIS — I480 Paroxysmal atrial fibrillation: Secondary | ICD-10-CM

## 2024-02-14 DIAGNOSIS — I2510 Atherosclerotic heart disease of native coronary artery without angina pectoris: Secondary | ICD-10-CM | POA: Diagnosis not present

## 2024-02-14 DIAGNOSIS — R0609 Other forms of dyspnea: Secondary | ICD-10-CM

## 2024-02-14 DIAGNOSIS — I10 Essential (primary) hypertension: Secondary | ICD-10-CM | POA: Diagnosis not present

## 2024-02-14 DIAGNOSIS — E785 Hyperlipidemia, unspecified: Secondary | ICD-10-CM | POA: Diagnosis not present

## 2024-02-14 DIAGNOSIS — I428 Other cardiomyopathies: Secondary | ICD-10-CM

## 2024-02-14 DIAGNOSIS — I4729 Other ventricular tachycardia: Secondary | ICD-10-CM

## 2024-02-14 DIAGNOSIS — N1831 Chronic kidney disease, stage 3a: Secondary | ICD-10-CM

## 2024-02-14 DIAGNOSIS — I5022 Chronic systolic (congestive) heart failure: Secondary | ICD-10-CM | POA: Diagnosis not present

## 2024-02-14 DIAGNOSIS — D6859 Other primary thrombophilia: Secondary | ICD-10-CM

## 2024-02-14 LAB — URINE CULTURE

## 2024-02-14 NOTE — Patient Instructions (Signed)
 Medication Instructions:  Your physician recommends that you continue on your current medications as directed. Please refer to the Current Medication list given to you today.  *If you need a refill on your cardiac medications before your next appointment, please call your pharmacy*  Lab Work: NONE ordered at this time of appointment    Testing/Procedures: NONE ordered at this time of appointment    Follow-Up: At [facility], you and your health needs are our priority.  As part of our continuing mission to provide you with exceptional heart care, our providers are all part of one team.  This team includes your primary Cardiologist (physician) and Advanced Practice Providers or APPs (Physician Assistants and Nurse Practitioners) who all work together to provide you with the care you need, when you need it.  Your next appointment:    Keep follow up appointment   Provider:   Kardie Tobb, DO    We recommend signing up for the patient portal called MyChart.  Sign up information is provided on this After Visit Summary.  MyChart is used to connect with patients for Virtual Visits (Telemedicine).  Patients are able to view lab/test results, encounter notes, upcoming appointments, etc.  Non-urgent messages can be sent to your provider as well.   To learn more about what you can do with MyChart, go to [email or web address].

## 2024-02-14 NOTE — Telephone Encounter (Signed)
 Pt states that Dr Reinhold Carbine took her off Eliquis  on Monday for a CT scan. She wants her to continue holding eliquis  until tomorrow. Pt was spilling blood in her urine and they thought she may have kidney stone (thus order for the CT). There were no kidney stones. Pt states she does have kidney infection. She is very worried to be off the Eliquis  and wants your opinion.

## 2024-02-14 NOTE — Progress Notes (Signed)
 Office Visit    Patient Name: Pamela [name] Date of Encounter: 02/14/2024  Primary Care Provider:  [name], MD Primary Cardiologist:  Kardie Tobb, DO  Chief Complaint    78 year old female with a history of CAD, chronic HRmrEF, paroxysmal atrial fibrillation, PVCs, hypertension, orthostatic hypotension, hyperlipidemia, history of TIA, type 2 diabetes, hypothyroidism, CKD stage III, and protein S deficiency who presents for follow-up related to syncope and hypertension.   Past Medical History    Past Medical History:  Diagnosis Date   Anticoagulated on Coumadin     chronic--- managed by hematology/ oncology,   Asthma, mild intermittent    followed by pcp--- per pt last exacerbation winter 2021 w/ acute bronchitis   Bilateral leg cramps    Blood dyscrasia    Chronic constipation    CKD (chronic kidney disease), stage III (HCC)    Closed bimalleolar fracture of left ankle 02/26/2020   DDD (degenerative disc disease), cervical    w/ spondylosis,  per pt last steroid injection 06/ 2022   DDD (degenerative disc disease), lumbosacral    DVT (deep venous thrombosis) (HCC) 07/30/2013   Dyspnea    occasionally   Dysrhythmia    GERD (gastroesophageal reflux disease)    History of cardiac murmur as a child    History of DVT of lower extremity    left lower extremity in 1980s, fell when bowling   History of pulmonary embolus (PE) 1993   per pt left lung post op 2 wks cholecystectomy   History of rheumatic fever as a child    per last echo 12-31-2019 no valvular issues   History of TIA (transient ischemic attack)    2014 and 2018 or 2019,  per pt no residual   HTN (hypertension)    followed by pcp   Hyperlipidemia    Hypothyroidism    IDA (iron deficiency anemia)    Macular degeneration of both eyes    Mild obstructive sleep apnea    per pt dx 2017 tried to uses cpap but intolerant   Mixed incontinence urge and stress    urologist--- dr Reginal Capra   OA  (osteoarthritis) 07/06/2018   Osteoporosis    PAF (paroxysmal atrial fibrillation) (HCC) 10/01/2014   cardiologist--- dr Chyrl Crawford tobb;   cardiac cath 02-18-2013 normal coronaries arteries, ef 50%, cath done since echo showed ef 30-35%; nucleat stress study 04/ 2020 normal , normal echo 04/ 2021,  event monitor 09-14-2020 rare ST/AT variable block   Pneumonia    PONV (postoperative nausea and vomiting)    Protein S deficiency (HCC)    followed by hemotology/ oncology-- dr d. Harles Lied ([facility]) dx 1980s;  prior DVT left lower leg 1980s and left lung PE 1993; chronic  coumadin  since 1980s   PVC's (premature ventricular contractions)    followed by cardiology   S/P cardiac catheterization 02/2013   Normal coronaries; low normal EF at 50%   Solitary pulmonary nodule on lung CT 02/06/2019   First noted 01/13/2014 > no change as of 12/21/2018   Spondylolisthesis, lumbar region 08/09/2018   Stroke (HCC)    TIA in 2018 or 2019   Transient ischemic attack 07/30/2013   Type 2 diabetes mellitus treated with insulin  (HCC)    endocrilogist--- whitney reardon NP     (03-10-2021  pt continuously checks blood sugar throughout the day w/ Libre, fasting sugar --- 69--200)   Wears glasses    Wears hearing aid in both ears  Past Surgical History:  Procedure Laterality Date   ABDOMINAL HYSTERECTOMY     BLEPHAROPLASTY     CARDIAC CATHETERIZATION  02/18/2013   @ [facility]  by Dr [place];  normal coronaries w/ preserved LVF, ef 50%;   previous cath 2001 normal ef 65%   CARPAL TUNNEL RELEASE Bilateral 1994   CATARACT EXTRACTION W/ INTRAOCULAR LENS IMPLANT Bilateral 2017   CHOLECYSTECTOMY     CHOLECYSTECTOMY, LAPAROSCOPIC  1993   COLONOSCOPY     EAR BIOPSY Left    FINGER SURGERY Left 2018   thumb   FOOT SURGERY     FOOT TENDON SURGERY Right    early 2000s   FRACTURE SURGERY Left 02/13/2020   left femur   HAND SURGERY Right 04/2022   HARDWARE REMOVAL Left 03/12/2021   Procedure: HARDWARE  REMOVAL;  Surgeon: Janeth Medicus, MD;  Location: [facility];  Service: Orthopedics;  Laterality: Left;  60 MINS   JOINT REPLACEMENT     LEFT HEART CATH AND CORONARY ANGIOGRAPHY N/A 12/07/2023   Procedure: LEFT HEART CATH AND CORONARY ANGIOGRAPHY;  Surgeon: Odie Benne, MD;  Location: [facility] INVASIVE CV [facility];  Service: Cardiovascular;  Laterality: N/A;   LUMBAR LAMINECTOMY/DECOMPRESSION MICRODISCECTOMY N/A 12/22/2022   Procedure: Lumbar Two-Lumbar Three, Lumbar Three-Lumbar Four Lumbar Decompression;  Surgeon: Elna Haggis, MD;  Location: [facility] OR;  Service: Neurosurgery;  Laterality: N/A;  RM 20 3C   ORIF ANKLE FRACTURE Left 02/26/2020   Procedure: OPEN REDUCTION INTERNAL FIXATION (ORIF) ANKLE FRACTURE;  Surgeon: Janeth Medicus, MD;  Location: [facility] OR;  Service: Orthopedics;  Laterality: Left;  90 mins   TONSILLECTOMY AND ADENOIDECTOMY Bilateral    TOTAL KNEE ARTHROPLASTY Left 03/2005   TOTAL VAGINAL HYSTERECTOMY  1988   per pt still has ovaries    Allergies  Allergies  Allergen Reactions   Loxapine Succinate Hives   Macrodantin  [Nitrofurantoin  Macrocrystal] Other (See Comments)    Pulmonary fibrosis   Naproxen Sodium Anaphylaxis and Hives   Telithromycin Nausea And Vomiting and Rash    Other Reaction(s): Unknown   Amoxapine And Related Hives   Darvon Nausea And Vomiting   Levothyroxine  Sodium Photosensitivity and Other (See Comments)    Blurry and double vision   Belsomra [Suvorexant] Other (See Comments)    Nightmares   Cymbalta [Duloxetine Hcl] Other (See Comments)    Blackouts   Duloxetine    Gabapentin  Other (See Comments)    Blurry vision   Lyrica  [Pregabalin ]     Makes her sedated   Naproxen Hives    Other Reaction(s): hives and GI upset   Ozempic  (0.25 Or 0.5 Mg-Dose) [Semaglutide (0.25 Or 0.5mg -Dos)] Diarrhea   Semaglutide  Diarrhea    Rybelsus    Amoxicillin  Rash    Other Reaction(s): hives and GI upset   Propoxyphene Nausea And  Vomiting    Other Reaction(s): hives and GI upset     Labs/Other Studies Reviewed    The following studies were reviewed today:  Cardiac Studies & Procedures   ______________________________________________________________________________________________ CARDIAC CATHETERIZATION  CARDIAC CATHETERIZATION 12/07/2023  Conclusion No angiographic evidence of CAD LVEDP 27-30 mm Hg  Recommendations: She is volume overloaded and has uncontrolled HTN with renal insufficiency and hyponatremia. Will admit to telemetry and begin diuresis with IV Lasix . BMET in am. Follow BP closely and adjust medication as needed.  Findings Coronary Findings Diagnostic  Dominance: Right  Left Anterior Descending Vessel is large.  Left Circumflex Vessel is large.  Right Coronary Artery Vessel is large.  Intervention  No interventions have been documented.   STRESS TESTS  NM PET CT CARDIAC PERFUSION MULTI W/ABSOLUTE BLOODFLOW 05/16/2023  Narrative   Normal perfusion images.  Myocardial blood flow reserve is decreased, but this is due to high resting flows (stress flows are normal); this could represent microvascular disease.  No other high risk findings such as TID or drop in EF with stress. Study is low risk   LV perfusion is normal. There is no evidence of ischemia. There is no evidence of infarction.   Rest left ventricular function is normal. Rest EF: 54%. Stress left ventricular function is normal. Stress EF: 65%. End diastolic cavity size is normal. End systolic cavity size is normal.   Myocardial blood flow was computed to be 1.68ml/g/min at rest and 2.32ml/g/min at stress. Global myocardial blood flow reserve was 1.48 and was abnormal.   Coronary calcium  was present on the attenuation correction CT images. Mild coronary calcifications were present. Coronary calcifications were present in the left anterior descending artery distribution(s).   Findings are consistent with no ischemia. The study  is low risk.   Electronically signed by Carson Clara, MD  CLINICAL DATA:  This over-read does not include interpretation of cardiac or coronary anatomy or pathology. The Cardiac PET CT interpretation by the cardiologist is attached.  COMPARISON:  CTA chest dated 06/06/2022  FINDINGS: Vascular: No evidence of thoracic aortic aneurysm. Atherosclerotic calcifications of the aortic arch.  The heart is top normal in size.  No pericardial effusion.  Mild coronary atherosclerosis of the LAD and left circumflex.  Mediastinum/Nodes: No suspicious mediastinal lymphadenopathy.  Lungs/Pleura: Mild platelike scarring/atelectasis in the right upper lobe. Mild lingular scarring/atelectasis.  Mild subpleural reticulation/fibrosis in the bilateral lower lobes, suggesting mild superimposed chronic interstitial lung disease.  No focal consolidation.  No suspicious pulmonary nodules.  No pleural effusion or pneumothorax.  Upper Abdomen: Visualized upper abdomen is notable for suspected prior cholecystectomy and vascular calcifications.  Musculoskeletal: Degenerative changes of the thoracic spine.  IMPRESSION: Mild subpleural reticulation/fibrosis in the bilateral lower lobes, suggesting mild chronic interstitial lung disease.  Otherwise, no significant extracardiac findings.  Aortic Atherosclerosis (ICD10-I70.0).   Electronically Signed By: Zadie Herter M.D. On: 05/16/2023 10:23   ECHOCARDIOGRAM  ECHOCARDIOGRAM COMPLETE 12/08/2023  Narrative ECHOCARDIOGRAM REPORT    Patient Name:   Pamela [name] Surles Date of Exam: 12/08/2023 Medical Rec #:  [id number]                    Height:       61.0 in Accession #:    [id number]                   Weight:       137.8 lb Date of Birth:  08-20-1946                    BSA:          1.612 m Patient Age:    78 years                     BP:           133/65 mmHg Patient Gender: F                            HR:           80  bpm. Exam Location:  Inpatient  Procedure: 2D Echo, Cardiac Doppler and  Color Doppler (Both Spectral and Color Flow Doppler were utilized during procedure).  Indications:     CHF-Acute Diastolic I50.31  History:         Patient has prior history of Echocardiogram examinations, most recent 04/25/2023. Stroke; Risk Factors:Hypertension and Diabetes.  Sonographer:     Astrid Blamer Referring Phys:  [id number] Odie Benne Diagnosing Phys: Jules Oar MD  IMPRESSIONS   1. Left ventricular ejection fraction, by estimation, is 45 to 50%. The left ventricle has mildly decreased function. The left ventricle has no regional wall motion abnormalities. The left ventricular internal cavity size was mildly to moderately dilated. There is mild concentric left ventricular hypertrophy. Left ventricular diastolic parameters are consistent with Grade I diastolic dysfunction (impaired relaxation). 2. Right ventricular systolic function is normal. The right ventricular size is normal. 3. Left atrial size was moderately dilated. 4. The mitral valve is degenerative. Mild mitral valve regurgitation. No evidence of mitral stenosis. 5. The aortic valve is tricuspid. There is mild calcification of the aortic valve. Aortic valve regurgitation is not visualized. Aortic valve sclerosis/calcification is present, without any evidence of aortic stenosis.  FINDINGS Left Ventricle: Left ventricular ejection fraction, by estimation, is 45 to 50%. The left ventricle has mildly decreased function. The left ventricle has no regional wall motion abnormalities. The left ventricular internal cavity size was mildly to moderately dilated. There is mild concentric left ventricular hypertrophy. Left ventricular diastolic parameters are consistent with Grade I diastolic dysfunction (impaired relaxation).  Right Ventricle: The right ventricular size is normal. No increase in right ventricular wall thickness. Right ventricular  systolic function is normal.  Left Atrium: Left atrial size was moderately dilated.  Right Atrium: Right atrial size was normal in size.  Pericardium: There is no evidence of pericardial effusion.  Mitral Valve: The mitral valve is degenerative in appearance. Mild mitral annular calcification. Mild mitral valve regurgitation. No evidence of mitral valve stenosis.  Tricuspid Valve: The tricuspid valve is normal in structure. Tricuspid valve regurgitation is trivial. No evidence of tricuspid stenosis.  Aortic Valve: The aortic valve is tricuspid. There is mild calcification of the aortic valve. Aortic valve regurgitation is not visualized. Aortic valve sclerosis/calcification is present, without any evidence of aortic stenosis. Aortic valve mean gradient measures 4.0 mmHg. Aortic valve peak gradient measures 6.4 mmHg. Aortic valve area, by VTI measures 2.14 cm.  Pulmonic Valve: The pulmonic valve was normal in structure. Pulmonic valve regurgitation is trivial. No evidence of pulmonic stenosis.  Aorta: The aortic root is normal in size and structure.  Venous: The inferior vena cava was not well visualized.  IAS/Shunts: No atrial level shunt detected by color flow Doppler.   LEFT VENTRICLE PLAX 2D LVIDd:         5.60 cm   Diastology LVIDs:         4.20 cm   LV e' medial:    2.94 cm/s LV PW:         1.00 cm   LV E/e' medial:  20.7 LV IVS:        0.90 cm   LV e' lateral:   4.57 cm/s LVOT diam:     1.90 cm   LV E/e' lateral: 13.3 LV SV:         60 LV SV Index:   37 LVOT Area:     2.84 cm   RIGHT VENTRICLE RV S prime:     21.00 cm/s TAPSE (M-mode): 2.2 cm  LEFT ATRIUM  Index        RIGHT ATRIUM          Index LA Vol (A2C):   60.5 ml 37.53 ml/m  RA Area:     9.27 cm LA Vol (A4C):   48.1 ml 29.84 ml/m  RA Volume:   17.10 ml 10.61 ml/m LA Biplane Vol: 55.9 ml 34.67 ml/m AORTIC VALVE AV Area (Vmax):    1.99 cm AV Area (Vmean):   1.95 cm AV Area (VTI):     2.14  cm AV Vmax:           126.00 cm/s AV Vmean:          93.200 cm/s AV VTI:            0.280 m AV Peak Grad:      6.4 mmHg AV Mean Grad:      4.0 mmHg LVOT Vmax:         88.40 cm/s LVOT Vmean:        64.200 cm/s LVOT VTI:          0.211 m LVOT/AV VTI ratio: 0.75  AORTA Ao Root diam: 2.20 cm  MITRAL VALVE MV Area (PHT): 4.52 cm     SHUNTS MV Decel Time: 168 msec     Systemic VTI:  0.21 m MV E velocity: 60.80 cm/s   Systemic Diam: 1.90 cm MV A velocity: 111.00 cm/s MV E/A ratio:  0.55  Jules Oar MD Electronically signed by Jules Oar MD Signature Date/Time: 12/08/2023/10:46:06 AM    Final (Updated)    MONITORS  CARDIAC EVENT MONITOR 01/18/2024  Narrative The patient wore the monitor for 6 days 10 hours starting Jan 18, 2024. Indication:  The minimum heart rate was 55 bpm, maximum heart rate was 104 bpm, and average heart rate was 70 bpm. Predominant underlying rhythm was Sinus Rhythm.   Premature atrial complexes were rare less than 1%. Premature Ventricular complexes occasional (12,174, 2%)  No pauses, No AV block and no atrial fibrillation present. Symptoms associated with premature ventricular complexes  Conclusion: This study shows evidence of symptomatic premature ventricular complexes.   CT SCANS  CT CORONARY MORPH W/CTA COR W/SCORE 10/02/2020  Addendum 10/02/2020  9:53 PM ADDENDUM REPORT: 10/02/2020 21:51  CLINICAL DATA:  This is a 78 year old female with chest pain and shortness of breath.  EXAM: Cardiac/Coronary  CT  TECHNIQUE: The patient was scanned on a [employer].  FINDINGS: A 120 kV retrospective scan was triggered in the descending thoracic aorta at 111 HU's. Axial non-contrast 3 mm slices were carried out through the heart. The data set was analyzed on a dedicated work station and scored using the Agatson method. Gantry rotation speed was 250 msecs and collimation was .6 mm. No beta blockade and 0.8 mg of sl NTG  was given. The 3D data set was reconstructed in 5% intervals of the 67-82 % of the R-R cycle. Diastolic phases were analyzed on a dedicated work station using MPR, MIP and VRT modes. The patient received 80 cc of contrast.  Aorta: Normal size. Mild proximal ascending and descending aorta calcifications. No dissection.  Aortic Valve:  Trileaflet.  No calcifications.  Coronary calcium  score - 20.4  Coronary Arteries:  Normal coronary origin.  Right dominance.  RCA is a large dominant artery that gives rise to PDA and PLVB. There is no plaque.  Left main is a large artery that gives rise to LAD and LCX arteries.  LAD is a large  vessel. There is minimal (<24%) CAD. There is no plaques in the mid or distal LAD.  LCX is a non-dominant artery that gives rise to one large OM1 branch. There is no plaque.  Other findings:  Normal pulmonary vein drainage into the left atrium.  Normal left atrial appendage without a thrombus.  Normal size of the pulmonary artery.  Mild Mitral annular calcification.  IMPRESSION: 1. Coronary calcium  score of 20.4. This was 57 percentile for age and sex matched control.  2. Normal coronary origin with right dominance.  3. Minimal CAD. CADRADS 1. Aggressive medical therapy is recommended.  4. Mild mitral annular calcification.  5. Aortic Atherosclerosis.  Kardie Tobb, DO   Electronically Signed By: Kardie  Tobb DO On: 10/02/2020 21:51  Narrative EXAM: OVER-READ INTERPRETATION  CT CHEST  The following report is an over-read performed by radiologist Dr. Alexandria Angel of [facility] on 10/02/2020. This over-read does not include interpretation of cardiac or coronary anatomy or pathology. The coronary calcium  score/coronary CTA interpretation by the cardiologist is attached.  COMPARISON:  None.  FINDINGS: Aortic atherosclerosis. Within the visualized portions of the thorax there are no suspicious appearing pulmonary  nodules or masses, there is no acute consolidative airspace disease, no pleural effusions, no pneumothorax and no lymphadenopathy. Visualized portions of the upper abdomen are unremarkable. There are no aggressive appearing lytic or blastic lesions noted in the visualized portions of the skeleton.  IMPRESSION: 1.  Aortic Atherosclerosis (ICD10-I70.0).  Electronically Signed: By: Alexandria Angel M.D. On: 10/02/2020 09:43     ______________________________________________________________________________________________     Recent Labs: 12/07/2023: B Natriuretic Peptide 202.5 12/20/2023: NT-Pro BNP 524 01/23/2024: TSH 2.012 01/24/2024: Magnesium  2.3 02/12/2024: ALT 27; BUN 27; Creatinine, Ser 0.96; Hemoglobin 11.3; Platelets 243; Potassium 4.9; Sodium 133  Recent Lipid Panel    Component Value Date/Time   CHOL 125 01/31/2024 0827   TRIG 41 01/31/2024 0827   HDL 61 01/31/2024 0827   CHOLHDL 2.0 01/31/2024 0827   LDLCALC 54 01/31/2024 0827    History of Present Illness    77 year old female with the above past medical history including CAD, chronic HRmrEF, paroxysmal atrial fibrillation, PVCs, hypertension, orthostatic hypotension, hyperlipidemia, history of TIA, type 2 diabetes, hypothyroidism, CKD stage III, and protein S deficiency.   She was initially evaluated by cardiology in 2012 in the setting of PVCs, palpitations.  Event monitor per PCP showed PVCs, NSVT.  Nuclear stress test in 2012 was low risk.  Echocardiogram at the time showed normal LV function, G2 DD, no significant valvular abnormalities.  She was seen again in 2014 in the setting of increased palpitations.  Repeat echocardiogram showed EF 30 to 35%, diffuse hypokinesis, G1 DD, mild MR.  She underwent LHC which showed normal coronary arteries, low normal EF.  She established with Dr. Emmette Harms in 2021 in the setting of orthostatic hypotension, shortness of breath, palpitations.  Echocardiogram in 12/2019 showed EF 65%, no RWMA,  moderate LVH, G1 DD, normal RV systolic function, no significant valvular abnormalities.  Cardiac monitor in 09/2020 showed rare SVT, likely atrial tachycardia with variable block.  Coronary CTA in 09/2020 showed coronary calcium  score 20.4 (79 percentile), minimal CAD.  ABIs in 2023 in the setting of bilateral leg cramping were normal.  Echocardiogram in 04/2023 in the setting of increased palpitations, chest pain showed EF 60 to 65%, no RWMA, moderate asymmetric LVH, G1 DD, normal RV systolic function, no significant valvular abnormalities.  Cardiac PET stress test in 05/2023 showed normal perfusion images,  no high risk findings.  Cardiac monitor in 07/2023 showed symptomatic NSVT, symptomatic frequent PACs.  Repeat cardiac catheterization in 12/2023 in the setting of increased shortness of breath on exertion, chest discomfort showed no angiographic evidence of CAD, LVEDP was elevated.  Thought to be in the setting of uncontrolled hypertension, renal insufficiency, hyponatremia.  She was admitted and underwent IV diuresis.  Echocardiogram 12/08/2023 showed EF 45 to 50%, no RWMA, mild LVH, G1 DD, normal RV systolic function, mild MR.  She was seen in the ED on 01/17/2024 following a syncopal episode.  She reported blacking out.  She felt her symptoms were due to hypotension.  She reported injuring her left foot and ankle, negative for acute fracture.  She was discharged home in stable condition. She was last seen in the office on 01/18/2024 and was stable from a cardiac standpoint.  She reported nightly palpitations.  30-day event monitor was ordered, however, she only worse the monitor for 1 week.   Preliminary results revealed average heart rate 70 bpm, predominantly normal sinus rhythm, rare PACs and occasional PVCs (2% burden), no significant arrhythmia. Lasix  was changed to as needed use.  She was hospitalized from 01/22/2024 to 01/26/2024 in the setting of recurrent syncope.  She was positive for orthostatic  hypotension. MRI of the brain was unremarkable.  Carotid ultrasounds showed no significant carotid artery stenosis.  She was treated for UTI.  Cardiology was consulted.  Permissive hypertension was recommended in the setting of orthostatic hypotension.  Outpatient follow-up with cardiology was recommended.  Her PCP contacted us  in the setting of hematuria.  She was advised to hold Eliquis  for 3 days.   She presents today for follow-up accompanied by her daughter.  Since her hospitalization she has been stable from a cardiac standpoint.  She denies any recurrent syncope.  She does have symptoms concerning for recurrent UTI, though she denies any further hematuria. She will resume Eliquis  this Thursday.  She is scheduled to see a urologist.  Since her syncopal episode she has had some blurry vision.  She questions whether or not she could have had a TIA, and whether not she needs to see a neurologist. She denies any worsening palpitations, denies any dizziness, denies symptoms concerning for angina.   Home Medications    Current Outpatient Medications  Medication Sig Dispense Refill   acetaminophen  (TYLENOL ) 650 MG CR tablet Take 650-1,300 mg by mouth every 8 (eight) hours as needed for pain.     albuterol  (VENTOLIN  HFA) 108 (90 Base) MCG/ACT inhaler INHALE 1 TO 2 PUFFS INTO THE LUNGS EVERY 6 HOURS AS NEEDED FOR WHEEZING OR SHORTNESS OF BREATH 6.7 g 3   aspirin  EC (ASPIRIN  LOW DOSE) 81 MG tablet Take 1 tablet (81 mg total) by mouth daily. Swallow whole. 90 tablet 3   atorvastatin  (LIPITOR ) 80 MG tablet TAKE 1 TABLET(80 MG) BY MOUTH DAILY 90 tablet 3   blood glucose meter kit and supplies KIT Dispense based on patient and insurance preference. Use up to four times daily as directed. 1 each 0   carboxymethylcellulose (REFRESH PLUS) 0.5 % SOLN Place 1 drop into both eyes 3 (three) times daily as needed (dry eyes).     carvedilol  (COREG ) 12.5 MG tablet Take 1 tablet (12.5 mg total) by mouth 2 (two) times  daily with a meal. 60 tablet 3   Cholecalciferol (VITAMIN D3) 50 MCG (2000 UT) TABS Take 4,000 Units by mouth in the morning and at bedtime.     ciprofloxacin  (  CIPRO ) 500 MG tablet Take 1 tablet (500 mg total) by mouth 2 (two) times daily for 7 days. 14 tablet 0   cyanocobalamin  (VITAMIN B12) 1000 MCG tablet Take 1,000 mcg by mouth daily with lunch.     Dulaglutide  (TRULICITY ) 0.75 MG/0.5ML SOAJ Inject 0.75 mg into the skin once a week. (Patient taking differently: Inject 0.75 mg into the skin once a week. Inject on Tuesday.) 6 mL 3   ELIQUIS  5 MG TABS tablet TAKE 1 TABLET(5 MG) BY MOUTH TWICE DAILY 60 tablet 2   estradiol (ESTRACE) 0.1 MG/GM vaginal cream Place 1 Applicatorful vaginally 2 (two) times a week. As needed on Tues and Thurs.     ferrous sulfate  325 (65 FE) MG tablet Take 325 mg by mouth daily with lunch.     furosemide  (LASIX ) 20 MG tablet Take 1 tablet (20 mg total) by mouth daily as needed for fluid or edema.     GEMTESA  75 MG TABS Take 1 tablet (75 mg total) by mouth at bedtime. 90 tablet 3   insulin  aspart (NOVOLOG  FLEXPEN) 100 UNIT/ML FlexPen Max daily 30 units (Patient taking differently: Inject 6 Units into the skin 3 (three) times daily with meals. Sliding scale Max daily 30 units) 30 mL 3   lisinopril  (ZESTRIL ) 20 MG tablet Take 1 tablet (20 mg total) by mouth daily. 90 tablet 1   magnesium  oxide (MAG-OX) 400 (240 Mg) MG tablet TAKE 2 TABLETS(800 MG) BY MOUTH TWICE DAILY (Patient taking differently: Take 800 mg by mouth 2 (two) times daily.) 120 tablet 2   nystatin cream (MYCOSTATIN) Apply 1 Application topically 4 (four) times daily as needed for dry skin (yeast infection).     ondansetron  (ZOFRAN -ODT) 8 MG disintegrating tablet Take 8 mg by mouth every 8 (eight) hours as needed for nausea or vomiting.      pantoprazole  (PROTONIX ) 40 MG tablet TAKE 1 TABLET(40 MG) BY MOUTH EVERY MORNING 90 tablet 1   pyridoxine  (B-6) 100 MG tablet Take 100 mg by mouth daily with lunch.      sennosides-docusate sodium  (SENOKOT-S) 8.6-50 MG tablet Take 1 tablet by mouth every other day.     sodium chloride  (OCEAN) 0.65 % SOLN nasal spray Place 1 spray into both nostrils as needed for congestion.     zolpidem  (AMBIEN ) 5 MG tablet TAKE 1 TABLET(5 MG) BY MOUTH AT BEDTIME AS NEEDED FOR SLEEP (Patient taking differently: Take 5 mg by mouth at bedtime.) 30 tablet 5   No current facility-administered medications for this visit.     Review of Systems    She denies chest pain, dyspnea, pnd, orthopnea, n, v, syncope, edema, weight gain, or early satiety. All other systems reviewed and are otherwise negative except as noted above.   Physical Exam    VS:  BP 122/60 (BP Location: Left Arm, Patient Position: Sitting, Cuff Size: Normal)   Pulse 66   Ht 5' (1.524 m)   Wt 144 lb 9.6 oz (65.6 kg)   SpO2 97%   BMI 28.24 kg/m  GEN: Well nourished, well developed, in no acute distress. HEENT: normal. Neck: Supple, no JVD, carotid bruits, or masses. Cardiac: RRR, no murmurs, rubs, or gallops. No clubbing, cyanosis, edema.  Radials/DP/PT 2+ and equal bilaterally.  Respiratory:  Respirations regular and unlabored, clear to auscultation bilaterally. GI: Soft, nontender, nondistended, BS + x 4. MS: no deformity or atrophy. Skin: warm and dry, no rash. Neuro:  Strength and sensation are intact. Psych: Normal affect.  Accessory Clinical  Findings    ECG personally reviewed by me today - EKG Interpretation Date/Time:  Wednesday February 14 2024 08:20:46 EDT Ventricular Rate:  66 PR Interval:  186 QRS Duration:  102 QT Interval:  406 QTC Calculation: 425 R Axis:   -1  Text Interpretation: Sinus rhythm with frequent Premature ventricular complexes Minimal voltage criteria for LVH, may be normal variant ( Cornell product ) Nonspecific ST abnormality When compared with ECG of 22-Jan-2024 15:54, PREVIOUS ECG IS PRESENT Confirmed by Marlana Silvan ([id number]) on 02/14/2024 8:53:07 AM  - no acute changes.    Lab Results  Component Value Date   WBC 5.6 02/12/2024   HGB 11.3 02/12/2024   HCT 35.4 02/12/2024   MCV 97 02/12/2024   PLT 243 02/12/2024   Lab Results  Component Value Date   CREATININE 0.96 02/12/2024   BUN 27 02/12/2024   NA 133 (L) 02/12/2024   K 4.9 02/12/2024   CL 97 02/12/2024   CO2 24 02/12/2024   Lab Results  Component Value Date   ALT 27 02/12/2024   AST 25 02/12/2024   ALKPHOS 111 02/12/2024   BILITOT 0.4 02/12/2024   Lab Results  Component Value Date   CHOL 125 01/31/2024   HDL 61 01/31/2024   LDLCALC 54 01/31/2024   TRIG 41 01/31/2024   CHOLHDL 2.0 01/31/2024    Lab Results  Component Value Date   HGBA1C 7.3 (H) 01/31/2024    Assessment & Plan    1. Hypertension/orthostatic hypotension/syncope/NSVT:  Cardiac monitor in 07/2023 showed symptomatic NSVT, symptomatic frequent PACs.  Recurrent syncope.  Also with orthostatic hypotension. Recent echo, LHC reassuring.  Readmitted in 01/2024 in the setting of recurrent syncope.  MRI was unremarkable, carotid ultrasound showed no significant carotid artery stenosis.  Repeat cardiac monitor in 01/2024 revealed average heart rate 70 bpm, predominantly normal sinus rhythm, rare PACs and occasional PVCs (2% burden), no significant arrhythmia.  Permissive hypertension recommended in the setting of severe orthostatic hypotension. She denies any recurrent syncope, denies worsening palpitations. Encouraged adequate hydration, gradual position changes, thigh sleeves, abdominal compression.  Lisinopril  was previously decreased to 10 mg but recently increased to 20 mg daily per PCP given ongoing elevated BP.  Recommend SBP greater than 120 mmHg as she seems to be symptomatic if her blood pressure is much below that.  Reviewed ED precautions.  Continue lisinopril , carvedilol .  2. Mild nonobstructive CAD: Coronary CTA in 09/2020 showed coronary calcium  score 20.4 (79 percentile), minimal CAD.  Cardiac PET stress test in 05/2023 showed  normal perfusion images, no high risk findings.  Cath in 12/2023 in the setting of increased shortness of breath on exertion, chest discomfort showed no angiographic evidence of CAD, LVEDP was elevated.  Stable with no anginal symptoms. Continue aspirin , carvedilol , lisinopril , and Lipitor .    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 Euvolemic and well compensated on exam. She has a history of rheumatic fever, with prior heart murmur, mild MR. No murmur appreciated on exam today.  Farxiga  was recently discontinued in the setting of recurrent UTI.  Continue carvedilol , lisinopril , prn Lasix .  Escalation of GDMT limited in the setting of severe orthostatic hypotension.   4. Paroxysmal atrial fibrillation: Not well-documented.  No evidence of A-fib on prior cardiac monitors.  On Eliquis  for protein S deficiency.    5. Hyperlipidemia: LDL was 54 in 01/2024.  Continue Lipitor .   6. CKD stage IIIa:  Creatinine was stable at 0.95 in 01/2024.  Following with nephrology.   7. Protein S deficiency/history of TIA/blurred vision: Since her syncopal episode she reports blurry vision.  She questions whether or not she could have had a TIA, and whether not she needs to see a neurologist.  She plans to follow-up with her ophthalmologist. MRI in 01/2024 showed chronic small vessel ischemic disease.  I advised her to discuss this with her PCP/hematologist.  Eliquis  on hold in the setting of recent hematuria.  She will resume Eliquis  tomorrow.  8. Recurrent UTI/hematuria: She has follow-up scheduled with urology.  9. Disposition: Follow-up as scheduled Dr. Emmette Harms in 03/2024.        Jude Norton, NP 02/14/2024, 8:54 AM

## 2024-02-15 ENCOUNTER — Ambulatory Visit: Payer: Self-pay | Admitting: Nurse Practitioner

## 2024-02-15 DIAGNOSIS — I130 Hypertensive heart and chronic kidney disease with heart failure and stage 1 through stage 4 chronic kidney disease, or unspecified chronic kidney disease: Secondary | ICD-10-CM | POA: Diagnosis not present

## 2024-02-15 DIAGNOSIS — B952 Enterococcus as the cause of diseases classified elsewhere: Secondary | ICD-10-CM | POA: Diagnosis not present

## 2024-02-15 DIAGNOSIS — S93402D Sprain of unspecified ligament of left ankle, subsequent encounter: Secondary | ICD-10-CM | POA: Diagnosis not present

## 2024-02-15 DIAGNOSIS — N1832 Chronic kidney disease, stage 3b: Secondary | ICD-10-CM | POA: Diagnosis not present

## 2024-02-15 DIAGNOSIS — E1140 Type 2 diabetes mellitus with diabetic neuropathy, unspecified: Secondary | ICD-10-CM | POA: Diagnosis not present

## 2024-02-15 DIAGNOSIS — D631 Anemia in chronic kidney disease: Secondary | ICD-10-CM | POA: Diagnosis not present

## 2024-02-15 DIAGNOSIS — E1122 Type 2 diabetes mellitus with diabetic chronic kidney disease: Secondary | ICD-10-CM | POA: Diagnosis not present

## 2024-02-15 DIAGNOSIS — N390 Urinary tract infection, site not specified: Secondary | ICD-10-CM | POA: Diagnosis not present

## 2024-02-15 DIAGNOSIS — I5042 Chronic combined systolic (congestive) and diastolic (congestive) heart failure: Secondary | ICD-10-CM | POA: Diagnosis not present

## 2024-02-16 ENCOUNTER — Encounter: Payer: Self-pay | Admitting: Nurse Practitioner

## 2024-02-20 ENCOUNTER — Telehealth: Payer: Self-pay | Admitting: Family Medicine

## 2024-02-20 DIAGNOSIS — S93402D Sprain of unspecified ligament of left ankle, subsequent encounter: Secondary | ICD-10-CM | POA: Diagnosis not present

## 2024-02-20 DIAGNOSIS — I5042 Chronic combined systolic (congestive) and diastolic (congestive) heart failure: Secondary | ICD-10-CM | POA: Diagnosis not present

## 2024-02-20 DIAGNOSIS — E871 Hypo-osmolality and hyponatremia: Secondary | ICD-10-CM

## 2024-02-20 DIAGNOSIS — D631 Anemia in chronic kidney disease: Secondary | ICD-10-CM | POA: Diagnosis not present

## 2024-02-20 DIAGNOSIS — N1832 Chronic kidney disease, stage 3b: Secondary | ICD-10-CM | POA: Diagnosis not present

## 2024-02-20 DIAGNOSIS — B952 Enterococcus as the cause of diseases classified elsewhere: Secondary | ICD-10-CM | POA: Diagnosis not present

## 2024-02-20 DIAGNOSIS — I130 Hypertensive heart and chronic kidney disease with heart failure and stage 1 through stage 4 chronic kidney disease, or unspecified chronic kidney disease: Secondary | ICD-10-CM | POA: Diagnosis not present

## 2024-02-20 DIAGNOSIS — I088 Other rheumatic multiple valve diseases: Secondary | ICD-10-CM

## 2024-02-20 DIAGNOSIS — E1122 Type 2 diabetes mellitus with diabetic chronic kidney disease: Secondary | ICD-10-CM | POA: Diagnosis not present

## 2024-02-20 DIAGNOSIS — I2510 Atherosclerotic heart disease of native coronary artery without angina pectoris: Secondary | ICD-10-CM

## 2024-02-20 DIAGNOSIS — E1140 Type 2 diabetes mellitus with diabetic neuropathy, unspecified: Secondary | ICD-10-CM | POA: Diagnosis not present

## 2024-02-20 DIAGNOSIS — N390 Urinary tract infection, site not specified: Secondary | ICD-10-CM | POA: Diagnosis not present

## 2024-02-20 NOTE — Telephone Encounter (Signed)
 [facility] [phone]

## 2024-02-27 ENCOUNTER — Other Ambulatory Visit: Payer: Self-pay | Admitting: Hematology and Oncology

## 2024-02-27 DIAGNOSIS — Z7901 Long term (current) use of anticoagulants: Secondary | ICD-10-CM

## 2024-02-27 DIAGNOSIS — D6859 Other primary thrombophilia: Secondary | ICD-10-CM

## 2024-02-28 ENCOUNTER — Ambulatory Visit: Admitting: Urology

## 2024-03-04 DIAGNOSIS — I5042 Chronic combined systolic (congestive) and diastolic (congestive) heart failure: Secondary | ICD-10-CM | POA: Diagnosis not present

## 2024-03-04 DIAGNOSIS — N390 Urinary tract infection, site not specified: Secondary | ICD-10-CM | POA: Diagnosis not present

## 2024-03-04 DIAGNOSIS — S93402D Sprain of unspecified ligament of left ankle, subsequent encounter: Secondary | ICD-10-CM | POA: Diagnosis not present

## 2024-03-04 DIAGNOSIS — E1140 Type 2 diabetes mellitus with diabetic neuropathy, unspecified: Secondary | ICD-10-CM | POA: Diagnosis not present

## 2024-03-04 DIAGNOSIS — B952 Enterococcus as the cause of diseases classified elsewhere: Secondary | ICD-10-CM | POA: Diagnosis not present

## 2024-03-04 DIAGNOSIS — E1122 Type 2 diabetes mellitus with diabetic chronic kidney disease: Secondary | ICD-10-CM | POA: Diagnosis not present

## 2024-03-04 DIAGNOSIS — D631 Anemia in chronic kidney disease: Secondary | ICD-10-CM | POA: Diagnosis not present

## 2024-03-04 DIAGNOSIS — I130 Hypertensive heart and chronic kidney disease with heart failure and stage 1 through stage 4 chronic kidney disease, or unspecified chronic kidney disease: Secondary | ICD-10-CM | POA: Diagnosis not present

## 2024-03-04 DIAGNOSIS — N1832 Chronic kidney disease, stage 3b: Secondary | ICD-10-CM | POA: Diagnosis not present

## 2024-03-05 NOTE — Progress Notes (Signed)
 Chief Complaint: No chief complaint on file.   History of Present Illness:  Pamela [name] is a 78 y.o. female who is seen in consultation from Sherre Clapper, MD for evaluation of ***.   Past Medical History:  Past Medical History:  Diagnosis Date   Anticoagulated on Coumadin     chronic--- managed by hematology/ oncology,   Asthma, mild intermittent    followed by pcp--- per pt last exacerbation winter 2021 w/ acute bronchitis   Bilateral leg cramps    Blood dyscrasia    Chronic constipation    CKD (chronic kidney disease), stage III (HCC)    Closed bimalleolar fracture of left ankle 02/26/2020   DDD (degenerative disc disease), cervical    w/ spondylosis,  per pt last steroid injection 06/ 2022   DDD (degenerative disc disease), lumbosacral    DVT (deep venous thrombosis) (HCC) 07/30/2013   Dyspnea    occasionally   Dysrhythmia    GERD (gastroesophageal reflux disease)    History of cardiac murmur as a child    History of DVT of lower extremity    left lower extremity in 1980s, fell when bowling   History of pulmonary embolus (PE) 1993   per pt left lung post op 2 wks cholecystectomy   History of rheumatic fever as a child    per last echo 12-31-2019 no valvular issues   History of TIA (transient ischemic attack)    2014 and 2018 or 2019,  per pt no residual   HTN (hypertension)    followed by pcp   Hyperlipidemia    Hypothyroidism    IDA (iron deficiency anemia)    Macular degeneration of both eyes    Mild obstructive sleep apnea    per pt dx 2017 tried to uses cpap but intolerant   Mixed incontinence urge and stress    urologist--- dr marda   OA (osteoarthritis) 07/06/2018   Osteoporosis    PAF (paroxysmal atrial fibrillation) (HCC) 10/01/2014   cardiologist--- dr dub tobb;   cardiac cath 02-18-2013 normal coronaries arteries, ef 50%, cath done since echo showed ef 30-35%; nucleat stress study 04/ 2020 normal , normal echo 04/ 2021,  event  monitor 09-14-2020 rare ST/AT variable block   Pneumonia    PONV (postoperative nausea and vomiting)    Protein S deficiency (HCC)    followed by hemotology/ oncology-- dr d. ezzard ([facility]) dx 1980s;  prior DVT left lower leg 1980s and left lung PE 1993; chronic  coumadin  since 1980s   PVC's (premature ventricular contractions)    followed by cardiology   S/P cardiac catheterization 02/2013   Normal coronaries; low normal EF at 50%   Solitary pulmonary nodule on lung CT 02/06/2019   First noted 01/13/2014 > no change as of 12/21/2018   Spondylolisthesis, lumbar region 08/09/2018   Stroke (HCC)    TIA in 2018 or 2019   Transient ischemic attack 07/30/2013   Type 2 diabetes mellitus treated with insulin  (HCC)    endocrilogist--- whitney reardon NP     (03-10-2021  pt continuously checks blood sugar throughout the day w/ Libre, fasting sugar --- 69--200)   Wears glasses    Wears hearing aid in both ears     Past Surgical History:  Past Surgical History:  Procedure Laterality Date   ABDOMINAL HYSTERECTOMY     BLEPHAROPLASTY     CARDIAC CATHETERIZATION  02/18/2013   @ [facility]  by Dr [place];  normal coronaries  w/ preserved LVF, ef 50%;   previous cath 2001 normal ef 65%   CARPAL TUNNEL RELEASE Bilateral 1994   CATARACT EXTRACTION W/ INTRAOCULAR LENS IMPLANT Bilateral 2017   CHOLECYSTECTOMY     CHOLECYSTECTOMY, LAPAROSCOPIC  1993   COLONOSCOPY     EAR BIOPSY Left    FINGER SURGERY Left 2018   thumb   FOOT SURGERY     FOOT TENDON SURGERY Right    early 2000s   FRACTURE SURGERY Left 02/13/2020   left femur   HAND SURGERY Right 04/2022   HARDWARE REMOVAL Left 03/12/2021   Procedure: HARDWARE REMOVAL;  Surgeon: Sharl Selinda Dover, MD;  Location: [facility];  Service: Orthopedics;  Laterality: Left;  60 MINS   JOINT REPLACEMENT     LEFT HEART CATH AND CORONARY ANGIOGRAPHY N/A 12/07/2023   Procedure: LEFT HEART CATH AND CORONARY ANGIOGRAPHY;  Surgeon:  Verlin Lonni BIRCH, MD;  Location: [facility] IN[facility];  Service: Cardiovascular;  Laterality: N/A;   LUMBAR LAMINECTOMY/DECOMPRESSION MICRODISCECTOMY N/A 12/22/2022   Procedure: Lumbar Two-Lumbar Three, Lumbar Three-Lumbar Four Lumbar Decompression;  Surgeon: Colon Shove, MD;  Location: [facility] OR;  Service: Neurosurgery;  Laterality: N/A;  RM 20 3C   ORIF ANKLE FRACTURE Left 02/26/2020   Procedure: OPEN REDUCTION INTERNAL FIXATION (ORIF) ANKLE FRACTURE;  Surgeon: Sharl Selinda Dover, MD;  [facility];  Service: Orthopedics;  Laterality: Left;  90 mins   TONSILLECTOMY AND ADENOIDECTOMY Bilateral    TOTAL KNEE ARTHROPLASTY Left 03/2005   TOTAL VAGINAL HYSTERECTOMY  1988   per pt still has ovaries    Allergies:  Allergies  Allergen Reactions   Loxapine Succinate Hives   Macrodantin  [Nitrofurantoin  Macrocrystal] Other (See Comments)    Pulmonary fibrosis   Naproxen Sodium Anaphylaxis and Hives   Telithromycin Nausea And Vomiting and Rash    Other Reaction(s): Unknown   Amoxapine And Related Hives   Darvon Nausea And Vomiting   Levothyroxine  Sodium Photosensitivity and Other (See Comments)    Blurry and double vision   Belsomra [Suvorexant] Other (See Comments)    Nightmares   Cymbalta [Duloxetine Hcl] Other (See Comments)    Blackouts   Duloxetine    Gabapentin  Other (See Comments)    Blurry vision   Lyrica  [Pregabalin ]     Makes her sedated   Naproxen Hives    Other Reaction(s): hives and GI upset   Ozempic  (0.25 Or 0.5 Mg-Dose) [Semaglutide (0.25 Or 0.5mg -Dos)] Diarrhea   Semaglutide  Diarrhea    Rybelsus    Amoxicillin  Rash    Other Reaction(s): hives and GI upset   Propoxyphene Nausea And Vomiting    Other Reaction(s): hives and GI upset    Family History:  Family History  Problem Relation Age of Onset   Cirrhosis Mother    Antithrombin III deficiency Mother        multiple emboli   Ulcerative colitis Mother    Diabetes Father    Coronary artery disease  Father    Heart attack Father    Hypertension Father    Kidney disease Father    Hypertension Sister    Diabetes Sister    Heart attack Sister        age 44    Protein S deficiency Daughter    Heart attack Brother    Hypertension Brother    Lung cancer Brother    Stroke Neg Hx    Colitis Neg Hx    Colon polyps Neg Hx    Esophageal cancer Neg Hx  Liver cancer Neg Hx    Pancreatic cancer Neg Hx    Rectal cancer Neg Hx    Stomach cancer Neg Hx    Breast cancer Neg Hx     Social History:  Social History   Tobacco Use   Smoking status: Never   Smokeless tobacco: Never  Vaping Use   Vaping status: Never Used  Substance Use Topics   Alcohol use: No   Drug use: Never    Review of symptoms:  Constitutional:  Negative for unexplained weight loss, night sweats, fever, chills ENT:  Negative for nose bleeds, sinus pain, painful swallowing CV:  Negative for chest pain, shortness of breath, exercise intolerance, palpitations, loss of consciousness Resp:  Negative for cough, wheezing, shortness of breath GI:  Negative for nausea, vomiting, diarrhea, bloody stools GU:  Positives noted in HPI; otherwise negative for gross hematuria, dysuria, urinary incontinence Neuro:  Negative for seizures, poor balance, limb weakness, slurred speech Psych:  Negative for lack of energy, depression, anxiety Endocrine:  Negative for polydipsia, polyuria, symptoms of hypoglycemia (dizziness, hunger, sweating) Hematologic:  Negative for anemia, purpura, petechia, prolonged or excessive bleeding, use of anticoagulants  Allergic:  Negative for difficulty breathing or choking as a result of exposure to anything; no shellfish allergy; no allergic response (rash/itch) to materials, foods  Physical exam: There were no vitals taken for this visit. GENERAL APPEARANCE:  Well appearing, well developed, well nourished, NAD HEENT: Atraumatic, Normocephalic. NECK: Normal appearance LUNGS: Normal inspiratory  and expiratory excursion HEART: Regular Rate ABDOMEN: *** EXTREMITIES: Moves all extremities well.  Without clubbing, cyanosis, or edema. NEUROLOGIC:  Alert and oriented x 3, normal gait, CN II-XII grossly intact.  MENTAL STATUS:  Appropriate. SKIN:  Warm, dry and intact.    Results: No results found for this or any previous visit (from the past 24 hours).  I have reviewed prior patient's records  I have reviewed referring/prior physicians records  I have reviewed urinalysis  I have reviewed prior urine cultures  I reviewed prior imaging studies  Assessment: No diagnosis found.   Plan: ***

## 2024-03-06 ENCOUNTER — Ambulatory Visit (INDEPENDENT_AMBULATORY_CARE_PROVIDER_SITE_OTHER): Admitting: Urology

## 2024-03-06 VITALS — BP 198/77 | HR 78 | Ht 61.0 in | Wt 141.0 lb

## 2024-03-06 DIAGNOSIS — Q6479 Other congenital malformations of bladder and urethra: Secondary | ICD-10-CM | POA: Diagnosis not present

## 2024-03-06 DIAGNOSIS — N952 Postmenopausal atrophic vaginitis: Secondary | ICD-10-CM

## 2024-03-06 DIAGNOSIS — R339 Retention of urine, unspecified: Secondary | ICD-10-CM | POA: Diagnosis not present

## 2024-03-06 DIAGNOSIS — Z87440 Personal history of urinary (tract) infections: Secondary | ICD-10-CM | POA: Diagnosis not present

## 2024-03-06 DIAGNOSIS — N3020 Other chronic cystitis without hematuria: Secondary | ICD-10-CM

## 2024-03-06 LAB — URINALYSIS, ROUTINE W REFLEX MICROSCOPIC
Bilirubin, UA: NEGATIVE
Glucose, UA: NEGATIVE
Ketones, UA: NEGATIVE
Nitrite, UA: NEGATIVE
Specific Gravity, UA: 1.01 (ref 1.005–1.030)
Urobilinogen, Ur: 0.2 mg/dL (ref 0.2–1.0)
pH, UA: 5.5 (ref 5.0–7.5)

## 2024-03-06 LAB — BLADDER SCAN AMB NON-IMAGING: Scan Result: 231

## 2024-03-06 LAB — MICROSCOPIC EXAMINATION

## 2024-03-06 MED ORDER — METHENAMINE HIPPURATE 1 G PO TABS: 1.0000 g | ORAL_TABLET | Freq: Two times a day (BID) | ORAL | 11 refills | Status: AC

## 2024-03-10 DIAGNOSIS — E1122 Type 2 diabetes mellitus with diabetic chronic kidney disease: Secondary | ICD-10-CM | POA: Diagnosis not present

## 2024-03-18 DIAGNOSIS — B952 Enterococcus as the cause of diseases classified elsewhere: Secondary | ICD-10-CM | POA: Diagnosis not present

## 2024-03-18 DIAGNOSIS — E1140 Type 2 diabetes mellitus with diabetic neuropathy, unspecified: Secondary | ICD-10-CM | POA: Diagnosis not present

## 2024-03-18 DIAGNOSIS — S93402D Sprain of unspecified ligament of left ankle, subsequent encounter: Secondary | ICD-10-CM | POA: Diagnosis not present

## 2024-03-18 DIAGNOSIS — I130 Hypertensive heart and chronic kidney disease with heart failure and stage 1 through stage 4 chronic kidney disease, or unspecified chronic kidney disease: Secondary | ICD-10-CM | POA: Diagnosis not present

## 2024-03-18 DIAGNOSIS — I5042 Chronic combined systolic (congestive) and diastolic (congestive) heart failure: Secondary | ICD-10-CM | POA: Diagnosis not present

## 2024-03-18 DIAGNOSIS — D631 Anemia in chronic kidney disease: Secondary | ICD-10-CM | POA: Diagnosis not present

## 2024-03-18 DIAGNOSIS — N1832 Chronic kidney disease, stage 3b: Secondary | ICD-10-CM | POA: Diagnosis not present

## 2024-03-18 DIAGNOSIS — E1122 Type 2 diabetes mellitus with diabetic chronic kidney disease: Secondary | ICD-10-CM | POA: Diagnosis not present

## 2024-03-18 DIAGNOSIS — N390 Urinary tract infection, site not specified: Secondary | ICD-10-CM | POA: Diagnosis not present

## 2024-03-19 ENCOUNTER — Ambulatory Visit: Attending: Cardiology | Admitting: Cardiology

## 2024-03-19 ENCOUNTER — Encounter: Payer: Self-pay | Admitting: Cardiology

## 2024-03-19 VITALS — BP 120/58 | HR 46 | Ht 60.0 in | Wt 141.6 lb

## 2024-03-19 DIAGNOSIS — R931 Abnormal findings on diagnostic imaging of heart and coronary circulation: Secondary | ICD-10-CM | POA: Diagnosis not present

## 2024-03-19 DIAGNOSIS — I480 Paroxysmal atrial fibrillation: Secondary | ICD-10-CM | POA: Diagnosis not present

## 2024-03-19 DIAGNOSIS — I5032 Chronic diastolic (congestive) heart failure: Secondary | ICD-10-CM

## 2024-03-19 DIAGNOSIS — R0602 Shortness of breath: Secondary | ICD-10-CM

## 2024-03-19 DIAGNOSIS — Z79899 Other long term (current) drug therapy: Secondary | ICD-10-CM | POA: Diagnosis not present

## 2024-03-19 NOTE — Patient Instructions (Signed)
 Medication Instructions:  Your physician recommends that you continue on your current medications as directed. Please refer to the Current Medication list given to you today.  *If you need a refill on your cardiac medications before your next appointment, please call your pharmacy*  Lab Work: CMET, Mag, BNP, CBC If you have labs (blood work) drawn today and your tests are completely normal, you will receive your results only by: MyChart Message (if you have MyChart) OR A paper copy in the mail If you have any lab test that is abnormal or we need to change your treatment, we will call you to review the results.  Your next appointment:   6 month(s)  Provider:   Kardie Tobb, DO

## 2024-03-19 NOTE — Progress Notes (Signed)
 Cardiology Office Note:    Date:  03/23/2024   ID:  Pamela [name], DOB 1945-10-31, MRN [id number]  PCP:  Sherre Clapper, MD  Cardiologist:  Annick Dimaio, DO  Electrophysiologist:  None   Referring MD: Sherre Clapper, MD     Doing ok, but I have had some challenged   History of Present Illness:    Pamela [name] is a 78 y.o. 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 LVEDP was elevated - she was admitted overnight for IV diuretics.   Since then has  experienced  syncope, fatigue, and shortness of breath. An incident of syncope occurred at a store, preceded by feeling warm and a cold sweat. A similar episode at a doctor's office led to a five-day hospitalization. During that hospitalization she was treated for acute exacerbation of heart failure.   She was discharge to home on loop diuretics, Lasix  20 mg as needed.    Also, Her blood pressure fluctuates significantly, with readings from 140s to 190s. She has recently discontinued diltiazem  and Farxiga , and her lisinopril  dose was increased to 20 mg.         Past Medical History:  Diagnosis Date   Anticoagulated on Coumadin     chronic--- managed by hematology/ oncology,   Asthma, mild intermittent    followed by pcp--- per pt last exacerbation winter 2021 w/ acute bronchitis   Bilateral leg cramps    Blood dyscrasia    Chronic constipation    CKD (chronic kidney disease), stage III (HCC)    Closed bimalleolar fracture of left ankle 02/26/2020   DDD (degenerative disc disease), cervical    w/ spondylosis,  per pt last steroid injection 06/  2022   DDD (degenerative disc disease), lumbosacral    DVT (deep venous thrombosis) (HCC) 07/30/2013   Dyspnea    occasionally   Dysrhythmia    GERD (gastroesophageal reflux disease)    History of cardiac murmur as a child    History of DVT of lower extremity    left lower extremity in 1980s, fell when bowling   History of pulmonary embolus (PE) 1993   per pt left lung post op 2 wks cholecystectomy   History of rheumatic fever as a child    per last echo 12-31-2019 no valvular issues   History of TIA (transient ischemic attack)    2014 and 2018 or 2019,  per pt no residual   HTN (hypertension)    followed by pcp   Hyperlipidemia    Hypothyroidism    IDA (iron deficiency anemia)    Macular degeneration of both eyes    Mild obstructive sleep apnea    per pt dx 2017 tried to uses cpap but intolerant   Mixed incontinence urge and stress    urologist--- dr marda   OA (osteoarthritis) 07/06/2018   Osteoporosis    PAF (paroxysmal atrial fibrillation) (HCC) 10/01/2014   cardiologist--- dr dub Taeya Theall;   cardiac cath 02-18-2013 normal coronaries arteries, ef 50%, cath done since echo showed ef 30-35%; nucleat stress study 04/ 2020 normal , normal echo 04/ 2021,  event monitor 09-14-2020  rare ST/AT variable block   Pneumonia    PONV (postoperative nausea and vomiting)    Protein S deficiency (HCC)    followed by hemotology/ oncology-- dr d. ezzard ([facility]) dx 1980s;  prior DVT left lower leg 1980s and left lung PE 1993; chronic  coumadin  since 1980s   PVC's (premature ventricular contractions)    followed by cardiology   S/P cardiac catheterization 02/2013   Normal coronaries; low normal EF at 50%   Solitary pulmonary nodule on lung CT 02/06/2019   First noted 01/13/2014 > no change as of 12/21/2018   Spondylolisthesis, lumbar region 08/09/2018   Stroke (HCC)    TIA in 2018 or 2019   Transient ischemic attack 07/30/2013   Type 2 diabetes mellitus treated with  insulin  (HCC)    endocrilogist--- whitney reardon NP     (03-10-2021  pt continuously checks blood sugar throughout the day w/ Libre, fasting sugar --- 69--200)   Wears glasses    Wears hearing aid in both ears     Past Surgical History:  Procedure Laterality Date   ABDOMINAL HYSTERECTOMY     BLEPHAROPLASTY     CARDIAC CATHETERIZATION  02/18/2013   @ [facility]  by Dr [place];  normal coronaries w/ preserved LVF, ef 50%;   previous cath 2001 normal ef 65%   CARPAL TUNNEL RELEASE Bilateral 1994   CATARACT EXTRACTION W/ INTRAOCULAR LENS IMPLANT Bilateral 2017   CHOLECYSTECTOMY     CHOLECYSTECTOMY, LAPAROSCOPIC  1993   COLONOSCOPY     EAR BIOPSY Left    FINGER SURGERY Left 2018   thumb   FOOT SURGERY     FOOT TENDON SURGERY Right    early 2000s   FRACTURE SURGERY Left 02/13/2020   left femur   HAND SURGERY Right 04/2022   HARDWARE REMOVAL Left 03/12/2021   Procedure: HARDWARE REMOVAL;  Surgeon: Sharl Selinda Dover, MD;  Location: [facility];  Service: Orthopedics;  Laterality: Left;  60 MINS   JOINT REPLACEMENT     LEFT HEART CATH AND CORONARY ANGIOGRAPHY N/A 12/07/2023   Procedure: LEFT HEART CATH AND CORONARY ANGIOGRAPHY;  Surgeon: Verlin Lonni BIRCH, MD;  Location: [facility] INVASIVE CV LAB;  Service: Cardiovascular;  Laterality: N/A;   LUMBAR LAMINECTOMY/DECOMPRESSION MICRODISCECTOMY N/A 12/22/2022   Procedure: Lumbar Two-Lumbar Three, Lumbar Three-Lumbar Four Lumbar Decompression;  Surgeon: Colon Shove, MD;  Location: [facility] OR;  Service: Neurosurgery;  Laterality: N/A;  RM 20 3C   ORIF ANKLE FRACTURE Left 02/26/2020   Procedure: OPEN REDUCTION INTERNAL FIXATION (ORIF) ANKLE FRACTURE;  Surgeon: Sharl Selinda Dover, MD;  Location: [facility] OR;  Service: Orthopedics;  Laterality: Left;  90 mins   TONSILLECTOMY AND ADENOIDECTOMY Bilateral    TOTAL KNEE ARTHROPLASTY Left 03/2005   TOTAL VAGINAL HYSTERECTOMY  1988   per pt still has ovaries    Current Medications: Current Meds   Medication Sig   acetaminophen  (TYLENOL ) 650 MG CR tablet Take 650-1,300 mg by mouth every 8 (eight) hours as needed for pain.   albuterol  (VENTOLIN  HFA) 108 (90 Base) MCG/ACT inhaler INHALE 1 TO 2 PUFFS INTO THE LUNGS EVERY 6 HOURS AS NEEDED FOR WHEEZING OR SHORTNESS OF BREATH   aspirin  EC (ASPIRIN  LOW DOSE) 81 MG tablet Take 1 tablet (81 mg total) by mouth daily. Swallow whole.   atorvastatin  (LIPITOR ) 80 MG tablet TAKE 1 TABLET(80 MG) BY MOUTH DAILY   blood glucose meter kit and supplies KIT Dispense based on patient and insurance preference. Use up  to four times daily as directed.   carboxymethylcellulose (REFRESH PLUS) 0.5 % SOLN Place 1 drop into both eyes 3 (three) times daily as needed (dry eyes).   carvedilol  (COREG ) 12.5 MG tablet Take 1 tablet (12.5 mg total) by mouth 2 (two) times daily with a meal.   Cholecalciferol (VITAMIN D3) 50 MCG (2000 UT) TABS Take 4,000 Units by mouth in the morning and at bedtime.   cyanocobalamin  (VITAMIN B12) 1000 MCG tablet Take 1,000 mcg by mouth daily with lunch.   Dulaglutide  (TRULICITY ) 0.75 MG/0.5ML SOAJ Inject 0.75 mg into the skin once a week.   ELIQUIS  5 MG TABS tablet TAKE 1 TABLET(5 MG) BY MOUTH TWICE DAILY   ferrous sulfate  325 (65 FE) MG tablet Take 325 mg by mouth daily with lunch.   furosemide  (LASIX ) 20 MG tablet Take 1 tablet (20 mg total) by mouth daily as needed for fluid or edema.   GEMTESA  75 MG TABS Take 1 tablet (75 mg total) by mouth at bedtime.   insulin  aspart (NOVOLOG  FLEXPEN) 100 UNIT/ML FlexPen Max daily 30 units   lisinopril  (ZESTRIL ) 20 MG tablet Take 1 tablet (20 mg total) by mouth daily.   magnesium  oxide (MAG-OX) 400 (240 Mg) MG tablet TAKE 2 TABLETS(800 MG) BY MOUTH TWICE DAILY   methenamine  (HIPREX ) 1 g tablet Take 1 tablet (1 g total) by mouth 2 (two) times daily with a meal.   nystatin cream (MYCOSTATIN) Apply 1 Application topically 4 (four) times daily as needed for dry skin (yeast infection).   ondansetron   (ZOFRAN -ODT) 8 MG disintegrating tablet Take 8 mg by mouth every 8 (eight) hours as needed for nausea or vomiting.    pantoprazole  (PROTONIX ) 40 MG tablet TAKE 1 TABLET(40 MG) BY MOUTH EVERY MORNING   pyridoxine  (B-6) 100 MG tablet Take 100 mg by mouth daily with lunch.   sennosides-docusate sodium  (SENOKOT-S) 8.6-50 MG tablet Take 1 tablet by mouth every other day.   sodium chloride  (OCEAN) 0.65 % SOLN nasal spray Place 1 spray into both nostrils as needed for congestion.   zolpidem  (AMBIEN ) 5 MG tablet TAKE 1 TABLET(5 MG) BY MOUTH AT BEDTIME AS NEEDED FOR SLEEP     Allergies:   Loxapine succinate, Macrodantin  [nitrofurantoin  macrocrystal], Naproxen sodium, Telithromycin, Amoxapine and related, Darvon, Levothyroxine  sodium, Belsomra [suvorexant], Cymbalta [duloxetine hcl], Duloxetine, Gabapentin , Lyrica  [pregabalin ], Naproxen, Ozempic  (0.25 or 0.5 mg-dose) [semaglutide (0.25 or 0.5mg -dos)], Semaglutide , Amoxicillin , and Propoxyphene   Social History   Socioeconomic History   Marital status: Married    Spouse name: Fairy   Number of children: Not on file   Years of education: Not on file   Highest education level: Some college, no degree  Occupational History   Not on file  Tobacco Use   Smoking status: Never   Smokeless tobacco: Never  Vaping Use   Vaping status: Never Used  Substance and Sexual Activity   Alcohol use: No   Drug use: Never   Sexual activity: Not on file    Comment: Hysterectomy  Other Topics Concern   Not on file  Social History Narrative   Lives with spouse in [place].  2 grown daughters.   Retired Print production planner     Social Drivers of Corporate investment banker Strain: Low Risk  (10/22/2023)   Overall Financial Resource Strain (CARDIA)    Difficulty of Paying Living Expenses: Not very hard  Food Insecurity: No Food Insecurity (01/23/2024)   Hunger Vital Sign    Worried About Running Out of  Food in the Last Year: Never true    Ran Out of Food in the Last  Year: Never true  Transportation Needs: No Transportation Needs (01/23/2024)   PRAPARE - Administrator, Civil Service (Medical): No    Lack of Transportation (Non-Medical): No  Physical Activity: Inactive (10/22/2023)   Exercise Vital Sign    Days of Exercise per Week: 0 days    Minutes of Exercise per Session: 30 min  Stress: No Stress Concern Present (10/22/2023)   [employer] of Occupational Health - Occupational Stress Questionnaire    Feeling of Stress : Only a little  Social Connections: Socially Integrated (01/23/2024)   Social Connection and Isolation Panel    Frequency of Communication with Friends and Family: More than three times a week    Frequency of Social Gatherings with Friends and Family: Patient declined    Attends Religious Services: More than 4 times per year    Active Member of Clubs or Organizations: Patient declined    Attends Engineer, structural: More than 4 times per year    Marital Status: Married     Family History: The patient's family history includes Antithrombin III deficiency in her mother; Cirrhosis in her mother; Coronary artery disease in her father; Diabetes in her father and sister; Heart attack in her brother, father, and sister; Hypertension in her brother, father, and sister; Kidney disease in her father; Lung cancer in her brother; Protein S deficiency in her daughter; Ulcerative colitis in her mother. There is no history of Stroke, Colitis, Colon polyps, Esophageal cancer, Liver cancer, Pancreatic cancer, Rectal cancer, Stomach cancer, or Breast cancer.  ROS:   Review of Systems  Constitution: Negative for decreased appetite, fever and weight gain.  HENT: Negative for congestion, ear discharge, hoarse voice and sore throat.   Eyes: Negative for discharge, redness, vision loss in right eye and visual halos.  Cardiovascular: Negative for chest pain, dyspnea on exertion, leg swelling, orthopnea and palpitations.   Respiratory: Negative for cough, hemoptysis, shortness of breath and snoring.   Endocrine: Negative for heat intolerance and polyphagia.  Hematologic/Lymphatic: Negative for bleeding problem. Does not bruise/bleed easily.  Skin: Negative for flushing, nail changes, rash and suspicious lesions.  Musculoskeletal: Negative for arthritis, joint pain, muscle cramps, myalgias, neck pain and stiffness.  Gastrointestinal: Negative for abdominal pain, bowel incontinence, diarrhea and excessive appetite.  Genitourinary: Negative for decreased libido, genital sores and incomplete emptying.  Neurological: Negative for brief paralysis, focal weakness, headaches and loss of balance.  Psychiatric/Behavioral: Negative for altered mental status, depression and suicidal ideas.  Allergic/Immunologic: Negative for HIV exposure and persistent infections.    EKGs/Labs/Other Studies Reviewed:    The following studies were reviewed today:   EKG: None today, prior EKG reviewed   Echocardiogram 12/2023 reviewed showed mildly depressed ejection fraction   Recent Labs: 12/20/2023: NT-Pro BNP 524 01/23/2024: TSH 2.012 03/19/2024: ALT 26; BNP 331.4; BUN 22; Creatinine, Ser 0.99; Hemoglobin 11.8; Magnesium  1.8; Platelets 251; Potassium 5.5; Sodium 133  Recent Lipid Panel    Component Value Date/Time   CHOL 125 01/31/2024 0827   TRIG 41 01/31/2024 0827   HDL 61 01/31/2024 0827   CHOLHDL 2.0 01/31/2024 0827   LDLCALC 54 01/31/2024 0827    Physical Exam:    VS:  BP (!) 120/58 (BP Location: Left Arm, Patient Position: Sitting, Cuff Size: Normal)   Pulse (!) 46   Ht 5' (1.524 m)   Wt 141 lb 9.6 oz (64.2 kg)  SpO2 97%   BMI 27.65 kg/m     Wt Readings from Last 3 Encounters:  03/19/24 141 lb 9.6 oz (64.2 kg)  03/06/24 141 lb (64 kg)  02/14/24 144 lb 9.6 oz (65.6 kg)     GEN: Well nourished, well developed in no acute distress HEENT: Normal NECK: No JVD; No carotid bruits LYMPHATICS: No  lymphadenopathy CARDIAC: S1S2 noted,RRR, no murmurs, rubs, gallops RESPIRATORY:  Clear to auscultation without rales, wheezing or rhonchi  ABDOMEN: Soft, non-tender, non-distended, +bowel sounds, no guarding. EXTREMITIES: No edema, No cyanosis, no clubbing MUSCULOSKELETAL:  No deformity  SKIN: Warm and dry NEUROLOGIC:  Alert and oriented x 3, non-focal PSYCHIATRIC:  Normal affect, good insight  ASSESSMENT:    1. Medication management   2. SOB (shortness of breath)   3. PAF (paroxysmal atrial fibrillation) (HCC)   4. Chronic diastolic congestive heart failure (HCC)   5. Depressed left ventricular ejection fraction       PLAN:    Heart failure - she is short of breath but this is not new with no evidence of clinical volume overload. But I would like to get blood work with BNP. She is taking her lasix  as needed we may need this once weekly.   Depressed ejection fraction - EF in April 45 to 50%. We need to slowly introduce GDMT. I have to be cautious because she recently had syncope which was deemed due to blood pressure lowering medication. Will continue the lisinopril  for now. I plan to transition her to entresto in the near future     Syncope - suspected to be due to blood pressure lowering medications. Since adjusting medication she has not had nay   Hypertension Blood pressure fluctuating between 140s to 190s. Reluctant to increase medication due to recent syncope that was likely due to medication  Diabetes mellitus- per primary provider  NSVT /PAF- cont dose of AV nodal blocker.  Will get blood work today  Follow-up in 6 months or sooner if needed   The patient is in agreement with the above plan. The patient left the office in stable condition.  The patient will follow up in67months or sooner if needed.   Medication Adjustments/Labs and Tests Ordered: Current medicines are reviewed at length with the patient today.  Concerns regarding medicines are outlined above.   Orders Placed This Encounter  Procedures   Comp Met (CMET)   Magnesium    B Nat Peptide   CBC w/Diff    No orders of the defined types were placed in this encounter.   Patient Instructions  Medication Instructions:  Your physician recommends that you continue on your current medications as directed. Please refer to the Current Medication list given to you today.  *If you need a refill on your cardiac medications before your next appointment, please call your pharmacy*  Lab Work: CMET, Mag, BNP, CBC If you have labs (blood work) drawn today and your tests are completely normal, you will receive your results only by: MyChart Message (if you have MyChart) OR A paper copy in the mail If you have any lab test that is abnormal or we need to change your treatment, we will call you to review the results.  Your next appointment:   6 month(s)  Provider:   Shannon Kirkendall, DO          Adopting a Healthy Lifestyle.  Know what a healthy weight is for you (roughly BMI <25) and aim to maintain this   Aim for  7+ servings of fruits and vegetables daily   65-80+ fluid ounces of water or unsweet tea for healthy kidneys   Limit to max 1 drink of alcohol per day; avoid smoking/tobacco   Limit animal fats in diet for cholesterol and heart health - choose grass fed whenever available   Avoid highly processed foods, and foods high in saturated/trans fats   Aim for low stress - take time to unwind and care for your mental health   Aim for 150 min of moderate intensity exercise weekly for heart health, and weights twice weekly for bone health   Aim for 7-9 hours of sleep daily   When it comes to diets, agreement about the perfect plan isnt easy to find, even among the experts. Experts at the [facility] of [employer] developed an idea known as the Healthy Eating Plate. Just imagine a plate divided into logical, healthy portions.   The emphasis is on diet quality:   Load up on  vegetables and fruits - one-half of your plate: Aim for color and variety, and remember that potatoes dont count.   Go for whole grains - one-quarter of your plate: Whole wheat, barley, wheat berries, quinoa, oats, brown rice, and foods made with them. If you want pasta, go with whole wheat pasta.   Protein power - one-quarter of your plate: Fish, chicken, beans, and nuts are all healthy, versatile protein sources. Limit red meat.   The diet, however, does go beyond the plate, offering a few other suggestions.   Use healthy plant oils, such as olive, canola, soy, corn, sunflower and peanut. Check the labels, and avoid partially hydrogenated oil, which have unhealthy trans fats.   If youre thirsty, drink water. Coffee and tea are good in moderation, but skip sugary drinks and limit milk and dairy products to one or two daily servings.   The type of carbohydrate in the diet is more important than the amount. Some sources of carbohydrates, such as vegetables, fruits, whole grains, and beans-are healthier than others.   Finally, stay active  Signed, Dub Huntsman, DO  03/23/2024 6:46 PM    [facility]

## 2024-03-20 LAB — CBC WITH DIFFERENTIAL/PLATELET
Basophils Absolute: 0 x10E3/uL (ref 0.0–0.2)
Basos: 0 %
EOS (ABSOLUTE): 0.1 x10E3/uL (ref 0.0–0.4)
Eos: 1 %
Hematocrit: 37.1 % (ref 34.0–46.6)
Hemoglobin: 11.8 g/dL (ref 11.1–15.9)
Immature Grans (Abs): 0 x10E3/uL (ref 0.0–0.1)
Immature Granulocytes: 0 %
Lymphocytes Absolute: 1.1 x10E3/uL (ref 0.7–3.1)
Lymphs: 14 %
MCH: 30.6 pg (ref 26.6–33.0)
MCHC: 31.8 g/dL (ref 31.5–35.7)
MCV: 96 fL (ref 79–97)
Monocytes Absolute: 0.8 x10E3/uL (ref 0.1–0.9)
Monocytes: 10 %
Neutrophils Absolute: 6 x10E3/uL (ref 1.4–7.0)
Neutrophils: 75 %
Platelets: 251 x10E3/uL (ref 150–450)
RBC: 3.85 x10E6/uL (ref 3.77–5.28)
RDW: 11.9 % (ref 11.7–15.4)
WBC: 8.1 x10E3/uL (ref 3.4–10.8)

## 2024-03-20 LAB — COMPREHENSIVE METABOLIC PANEL WITH GFR
ALT: 26 IU/L (ref 0–32)
AST: 28 IU/L (ref 0–40)
Albumin: 3.9 g/dL (ref 3.8–4.8)
Alkaline Phosphatase: 117 IU/L (ref 44–121)
BUN/Creatinine Ratio: 22 (ref 12–28)
BUN: 22 mg/dL (ref 8–27)
Bilirubin Total: 0.5 mg/dL (ref 0.0–1.2)
CO2: 22 mmol/L (ref 20–29)
Calcium: 9.5 mg/dL (ref 8.7–10.3)
Chloride: 95 mmol/L — ABNORMAL LOW (ref 96–106)
Creatinine, Ser: 0.99 mg/dL (ref 0.57–1.00)
Globulin, Total: 2.2 g/dL (ref 1.5–4.5)
Glucose: 125 mg/dL — ABNORMAL HIGH (ref 70–99)
Potassium: 5.5 mmol/L — ABNORMAL HIGH (ref 3.5–5.2)
Sodium: 133 mmol/L — ABNORMAL LOW (ref 134–144)
Total Protein: 6.1 g/dL (ref 6.0–8.5)
eGFR: 58 mL/min/1.73 — ABNORMAL LOW (ref >59)

## 2024-03-20 LAB — MAGNESIUM: Magnesium: 1.8 mg/dL (ref 1.6–2.3)

## 2024-03-20 LAB — BRAIN NATRIURETIC PEPTIDE: BNP: 331.4 pg/mL — ABNORMAL HIGH (ref 0.0–100.0)

## 2024-03-21 ENCOUNTER — Ambulatory Visit: Payer: Self-pay | Admitting: Cardiology

## 2024-03-21 DIAGNOSIS — R899 Unspecified abnormal finding in specimens from other organs, systems and tissues: Secondary | ICD-10-CM

## 2024-03-21 DIAGNOSIS — Z79899 Other long term (current) drug therapy: Secondary | ICD-10-CM

## 2024-03-28 ENCOUNTER — Ambulatory Visit (HOSPITAL_BASED_OUTPATIENT_CLINIC_OR_DEPARTMENT_OTHER): Admitting: Family

## 2024-03-28 VITALS — BP 170/78 | HR 64 | Ht 61.0 in | Wt 143.6 lb

## 2024-03-28 DIAGNOSIS — M25375 Other instability, left foot: Secondary | ICD-10-CM

## 2024-03-28 DIAGNOSIS — R0989 Other specified symptoms and signs involving the circulatory and respiratory systems: Secondary | ICD-10-CM

## 2024-03-28 DIAGNOSIS — M2042 Other hammer toe(s) (acquired), left foot: Secondary | ICD-10-CM | POA: Diagnosis not present

## 2024-03-28 DIAGNOSIS — M19172 Post-traumatic osteoarthritis, left ankle and foot: Secondary | ICD-10-CM | POA: Diagnosis not present

## 2024-03-28 DIAGNOSIS — I951 Orthostatic hypotension: Secondary | ICD-10-CM

## 2024-03-28 DIAGNOSIS — Z87898 Personal history of other specified conditions: Secondary | ICD-10-CM | POA: Diagnosis not present

## 2024-03-28 HISTORY — DX: Other instability, left foot: M25.375

## 2024-03-28 NOTE — Progress Notes (Signed)
 Subjective:  Patient ID: Pamela [name], female    DOB: 03-10-1946  Age: 78 y.o. MRN: [id number]  Chief Complaint  Patient presents with   Medical Management of Chronic Issues   Discussed the use of AI scribe software for clinical note transcription with the patient, who gave verbal consent to proceed.  History of Present Illness   Pamela [name] is a 78 year old female who presents for a six-week follow-up.  Foot pain and deformity - Chronic foot issues including bunion and hammer toe - Recent diagnosis of foot joint sprain by Dr. Kit - Persistent pain in the affected foot, not fully healed - Fitted with a brace, which provided some relief after initial discomfort - Uses diclofenac gel (Voltaren) for pain management - Pain worsens with foot cramps or prolonged standing - Pain intensity varies with different footwear  Nocturnal leg and foot cramps - Experiences leg and foot cramps at night, with toes curling upwards - Tried tonic water without relief and finds the taste unpleasant - Has not tried Hyland's cramps remedy - Magnesium  levels checked due to concern for low magnesium  as a cause of cramps  Electrolyte abnormalities - History of high potassium levels, most recently noted on July 15th - Potassium levels have been high normal in the past - Currently taking lisinopril  (can increase potassium) and Lasix  (can decrease potassium) - Recent labs ordered: aldosterone, renin activity, chemistry panel, pro BNP, and magnesium ; results pending - Instructed to take Lasix  after a high BMP result and to follow up with blood work in a week  Nocturia and lower urinary tract symptoms - Frequent nocturnal urination, waking three to four times per night - Occasional burning with urination despite current therapy - Currently taking methenamine  (Hiprex ) and Gemtesa  for urinary symptoms - No significant improvement in urinary symptoms with Gemtesa            03/29/2024   10:25 AM 04/17/2023    8:55 AM 03/30/2023    2:14 PM 12/14/2022    9:06 AM 02/15/2022    1:16 PM  Depression screen PHQ 2/9  Decreased Interest 0 1 0 0 0  Down, Depressed, Hopeless 0 0 0 0 0  PHQ - 2 Score 0 1 0 0 0  Altered sleeping 2 3     Tired, decreased energy 3 2     Change in appetite 0 0     Feeling bad or failure about yourself  1 0     Trouble concentrating 0 1     Moving slowly or fidgety/restless 2 1     Suicidal thoughts 0 0     PHQ-9 Score 8 8     Difficult doing work/chores Somewhat difficult Somewhat difficult           03/29/2024   10:25 AM  Fall Risk   Falls in the past year? 1  Number falls in past yr: 1  Injury with Fall? 1  Risk for fall due to : Impaired balance/gait  Follow up Falls evaluation completed    Patient Care Team: Sherre Clapper, MD as PCP - General (Internal Medicine) Sheena Pugh, DO as PCP - Cardiology (Cardiology) Ezzard Valaria LABOR, MD as Consulting Physician (Oncology) Arloa Brunner, OD (Optometry) Ortho, Emerge (Specialist) Darlean Ozell NOVAK, MD as Consulting Physician (Pulmonary Disease) Ethyl Lonni BRAVO, MD (Inactive) as Consulting Physician (Otolaryngology) Tobb, Kardie, DO as Consulting Physician (Cardiology) Kassie Mallick, MD (Inactive) as Consulting Physician (Endocrinology) Ishmael Slough, MD as Consulting Physician (  Rheumatology) Armbruster, Elspeth SQUIBB, MD as Consulting Physician (Gastroenterology) Arloa Brunner, OD (Optometry)   Review of Systems  Constitutional:  Positive for diaphoresis. Negative for chills, fatigue and fever.  HENT:  Negative for congestion, ear pain, rhinorrhea and sore throat.   Respiratory:  Negative for cough and shortness of breath.   Cardiovascular:  Negative for chest pain.  Gastrointestinal:  Negative for abdominal pain, constipation, diarrhea, nausea and vomiting.  Genitourinary:  Negative for dysuria and urgency.  Musculoskeletal:  Negative for back pain and myalgias.  Neurological:   Negative for dizziness, weakness, light-headedness and headaches.  Psychiatric/Behavioral:  Negative for dysphoric mood. The patient is not nervous/anxious.     Current Outpatient Medications on File Prior to Visit  Medication Sig Dispense Refill   acetaminophen  (TYLENOL ) 650 MG CR tablet Take 650-1,300 mg by mouth every 8 (eight) hours as needed for pain.     albuterol  (VENTOLIN  HFA) 108 (90 Base) MCG/ACT inhaler INHALE 1 TO 2 PUFFS INTO THE LUNGS EVERY 6 HOURS AS NEEDED FOR WHEEZING OR SHORTNESS OF BREATH 6.7 g 3   aspirin  EC (ASPIRIN  LOW DOSE) 81 MG tablet Take 1 tablet (81 mg total) by mouth daily. Swallow whole. 90 tablet 3   atorvastatin  (LIPITOR ) 80 MG tablet TAKE 1 TABLET(80 MG) BY MOUTH DAILY 90 tablet 3   blood glucose meter kit and supplies KIT Dispense based on patient and insurance preference. Use up to four times daily as directed. 1 each 0   carboxymethylcellulose (REFRESH PLUS) 0.5 % SOLN Place 1 drop into both eyes 3 (three) times daily as needed (dry eyes).     Cholecalciferol (VITAMIN D3) 50 MCG (2000 UT) TABS Take 4,000 Units by mouth in the morning and at bedtime.     cyanocobalamin  (VITAMIN B12) 1000 MCG tablet Take 1,000 mcg by mouth daily with lunch.     Dulaglutide  (TRULICITY ) 0.75 MG/0.5ML SOAJ Inject 0.75 mg into the skin once a week. 6 mL 3   ELIQUIS  5 MG TABS tablet TAKE 1 TABLET(5 MG) BY MOUTH TWICE DAILY 60 tablet 2   ferrous sulfate  325 (65 FE) MG tablet Take 325 mg by mouth daily with lunch.     furosemide  (LASIX ) 20 MG tablet Take 1 tablet (20 mg total) by mouth daily as needed for fluid or edema.     GEMTESA  75 MG TABS Take 1 tablet (75 mg total) by mouth at bedtime. 90 tablet 3   insulin  aspart (NOVOLOG  FLEXPEN) 100 UNIT/ML FlexPen Max daily 30 units 30 mL 3   magnesium  oxide (MAG-OX) 400 (240 Mg) MG tablet TAKE 2 TABLETS(800 MG) BY MOUTH TWICE DAILY 120 tablet 2   methenamine  (HIPREX ) 1 g tablet Take 1 tablet (1 g total) by mouth 2 (two) times daily with a  meal. 60 tablet 11   nystatin cream (MYCOSTATIN) Apply 1 Application topically 4 (four) times daily as needed for dry skin (yeast infection).     ondansetron  (ZOFRAN -ODT) 8 MG disintegrating tablet Take 8 mg by mouth every 8 (eight) hours as needed for nausea or vomiting.      pantoprazole  (PROTONIX ) 40 MG tablet TAKE 1 TABLET(40 MG) BY MOUTH EVERY MORNING 90 tablet 1   pyridoxine  (B-6) 100 MG tablet Take 100 mg by mouth daily with lunch.     sennosides-docusate sodium  (SENOKOT-S) 8.6-50 MG tablet Take 1 tablet by mouth every other day.     sodium chloride  (OCEAN) 0.65 % SOLN nasal spray Place 1 spray into both nostrils as needed for  congestion.     zolpidem  (AMBIEN ) 5 MG tablet TAKE 1 TABLET(5 MG) BY MOUTH AT BEDTIME AS NEEDED FOR SLEEP 30 tablet 5   No current facility-administered medications on file prior to visit.   Past Medical History:  Diagnosis Date   Anticoagulated on Coumadin     chronic--- managed by hematology/ oncology,   Asthma, mild intermittent    followed by pcp--- per pt last exacerbation winter 2021 w/ acute bronchitis   Bilateral leg cramps    Blood dyscrasia    Chronic constipation    CKD (chronic kidney disease), stage III (HCC)    Closed bimalleolar fracture of left ankle 02/26/2020   DDD (degenerative disc disease), cervical    w/ spondylosis,  per pt last steroid injection 06/ 2022   DDD (degenerative disc disease), lumbosacral    DVT (deep venous thrombosis) (HCC) 07/30/2013   Dyspnea    occasionally   Dysrhythmia    GERD (gastroesophageal reflux disease)    History of cardiac murmur as a child    History of DVT of lower extremity    left lower extremity in 1980s, fell when bowling   History of pulmonary embolus (PE) 1993   per pt left lung post op 2 wks cholecystectomy   History of rheumatic fever as a child    per last echo 12-31-2019 no valvular issues   History of TIA (transient ischemic attack)    2014 and 2018 or 2019,  per pt no residual   HTN  (hypertension)    followed by pcp   Hyperlipidemia    Hypothyroidism    IDA (iron deficiency anemia)    Macular degeneration of both eyes    Mild obstructive sleep apnea    per pt dx 2017 tried to uses cpap but intolerant   Mixed incontinence urge and stress    urologist--- dr marda   OA (osteoarthritis) 07/06/2018   Osteoporosis    PAF (paroxysmal atrial fibrillation) (HCC) 10/01/2014   cardiologist--- dr dub tobb;   cardiac cath 02-18-2013 normal coronaries arteries, ef 50%, cath done since echo showed ef 30-35%; nucleat stress study 04/ 2020 normal , normal echo 04/ 2021,  event monitor 09-14-2020 rare ST/AT variable block   Pneumonia    PONV (postoperative nausea and vomiting)    Protein S deficiency (HCC)    followed by hemotology/ oncology-- dr d. ezzard ([facility]) dx 1980s;  prior DVT left lower leg 1980s and left lung PE 1993; chronic  coumadin  since 1980s   PVC's (premature ventricular contractions)    followed by cardiology   S/P cardiac catheterization 02/2013   Normal coronaries; low normal EF at 50%   Solitary pulmonary nodule on lung CT 02/06/2019   First noted 01/13/2014 > no change as of 12/21/2018   Spondylolisthesis, lumbar region 08/09/2018   Stroke (HCC)    TIA in 2018 or 2019   Transient ischemic attack 07/30/2013   Type 2 diabetes mellitus treated with insulin  (HCC)    endocrilogist--- whitney reardon NP     (03-10-2021  pt continuously checks blood sugar throughout the day w/ Libre, fasting sugar --- 69--200)   Wears glasses    Wears hearing aid in both ears    Past Surgical History:  Procedure Laterality Date   ABDOMINAL HYSTERECTOMY     BLEPHAROPLASTY     CARDIAC CATHETERIZATION  02/18/2013   @ [facility]  by Dr [place];  normal coronaries w/ preserved LVF, ef 50%;   previous cath 2001 normal  ef 65%   CARPAL TUNNEL RELEASE Bilateral 1994   CATARACT EXTRACTION W/ INTRAOCULAR LENS IMPLANT Bilateral 2017   CHOLECYSTECTOMY     CHOLECYSTECTOMY,  LAPAROSCOPIC  1993   COLONOSCOPY     EAR BIOPSY Left    FINGER SURGERY Left 2018   thumb   FOOT SURGERY     FOOT TENDON SURGERY Right    early 2000s   FRACTURE SURGERY Left 02/13/2020   left femur   HAND SURGERY Right 04/2022   HARDWARE REMOVAL Left 03/12/2021   Procedure: HARDWARE REMOVAL;  Surgeon: Sharl Selinda Dover, MD;  Location: [facility];  Service: Orthopedics;  Laterality: Left;  60 MINS   JOINT REPLACEMENT     LEFT HEART CATH AND CORONARY ANGIOGRAPHY N/A 12/07/2023   Procedure: LEFT HEART CATH AND CORONARY ANGIOGRAPHY;  Surgeon: Verlin Lonni BIRCH, MD;  Location: [facility] INVASIVE CV LAB;  Service: Cardiovascular;  Laterality: N/A;   LUMBAR LAMINECTOMY/DECOMPRESSION MICRODISCECTOMY N/A 12/22/2022   Procedure: Lumbar Two-Lumbar Three, Lumbar Three-Lumbar Four Lumbar Decompression;  Surgeon: Colon Shove, MD;  Location: [facility] OR;  Service: Neurosurgery;  Laterality: N/A;  RM 20 3C   ORIF ANKLE FRACTURE Left 02/26/2020   Procedure: OPEN REDUCTION INTERNAL FIXATION (ORIF) ANKLE FRACTURE;  Surgeon: Sharl Selinda Dover, MD;  Location: [facility];  Service: Orthopedics;  Laterality: Left;  90 mins   TONSILLECTOMY AND ADENOIDECTOMY Bilateral    TOTAL KNEE ARTHROPLASTY Left 03/2005   TOTAL VAGINAL HYSTERECTOMY  1988   per pt still has ovaries    Family History  Problem Relation Age of Onset   Cirrhosis Mother    Antithrombin III deficiency Mother        multiple emboli   Ulcerative colitis Mother    Diabetes Father    Coronary artery disease Father    Heart attack Father    Hypertension Father    Kidney disease Father    Hypertension Sister    Diabetes Sister    Heart attack Sister        age 17    Protein S deficiency Daughter    Heart attack Brother    Hypertension Brother    Lung cancer Brother    Stroke Neg Hx    Colitis Neg Hx    Colon polyps Neg Hx    Esophageal cancer Neg Hx    Liver cancer Neg Hx    Pancreatic cancer Neg Hx    Rectal cancer Neg Hx     Stomach cancer Neg Hx    Breast cancer Neg Hx    Social History   Socioeconomic History   Marital status: Married    Spouse name: Fairy   Number of children: Not on file   Years of education: Not on file   Highest education level: Some college, no degree  Occupational History   Not on file  Tobacco Use   Smoking status: Never   Smokeless tobacco: Never  Vaping Use   Vaping status: Never Used  Substance and Sexual Activity   Alcohol use: No   Drug use: Never   Sexual activity: Not on file    Comment: Hysterectomy  Other Topics Concern   Not on file  Social History Narrative   Lives with spouse in [place].  2 grown daughters.   Retired Print production planner     Social Drivers of Corporate investment banker Strain: Low Risk  (03/29/2024)   Overall Financial Resource Strain (CARDIA)    Difficulty of Paying Living Expenses: Not  very hard  Food Insecurity: No Food Insecurity (03/29/2024)   Hunger Vital Sign    Worried About Running Out of Food in the Last Year: Never true    Ran Out of Food in the Last Year: Never true  Transportation Needs: No Transportation Needs (03/29/2024)   PRAPARE - Administrator, Civil Service (Medical): No    Lack of Transportation (Non-Medical): No  Physical Activity: Inactive (03/29/2024)   Exercise Vital Sign    Days of Exercise per Week: 0 days    Minutes of Exercise per Session: 0 min  Stress: No Stress Concern Present (03/29/2024)   [employer] of Occupational Health - Occupational Stress Questionnaire    Feeling of Stress: Only a little  Social Connections: Socially Integrated (03/29/2024)   Social Connection and Isolation Panel    Frequency of Communication with Friends and Family: More than three times a week    Frequency of Social Gatherings with Friends and Family: Once a week    Attends Religious Services: More than 4 times per year    Active Member of [employer] or Organizations: Yes    Attends Hospital doctor: More than 4 times per year    Marital Status: Married    Objective:  BP (!) 152/62   Pulse 61   Temp 98.2 F (36.8 C)   Ht 5' 1 (1.549 m)   Wt 141 lb (64 kg)   SpO2 98%   BMI 26.64 kg/m      03/29/2024   11:40 AM 03/29/2024   10:20 AM 03/28/2024   10:27 AM  BP/Weight  Systolic BP 152 160 170  Diastolic BP 62 58 78  Wt. (Lbs)  141   BMI  26.64 kg/m2     Physical Exam Vitals reviewed.  Constitutional:      Appearance: Normal appearance. She is normal weight.  Neck:     Vascular: No carotid bruit.  Cardiovascular:     Rate and Rhythm: Normal rate and regular rhythm.     Heart sounds: Normal heart sounds.  Pulmonary:     Effort: Pulmonary effort is normal. No respiratory distress.     Breath sounds: Normal breath sounds.  Abdominal:     General: Abdomen is flat. Bowel sounds are normal.     Palpations: Abdomen is soft.     Tenderness: There is no abdominal tenderness.  Musculoskeletal:     Comments: Foot - brace  Neurological:     Mental Status: She is alert and oriented to person, place, and time.  Psychiatric:        Mood and Affect: Mood normal.        Behavior: Behavior normal.         Lab Results  Component Value Date   WBC 8.1 03/19/2024   HGB 11.8 03/19/2024   HCT 37.1 03/19/2024   PLT 251 03/19/2024   GLUCOSE 125 (H) 03/19/2024   CHOL 125 01/31/2024   TRIG 41 01/31/2024   HDL 61 01/31/2024   LDLCALC 54 01/31/2024   ALT 26 03/19/2024   AST 28 03/19/2024   NA 133 (L) 03/19/2024   K 5.5 (H) 03/19/2024   CL 95 (L) 03/19/2024   CREATININE 0.99 03/19/2024   BUN 22 03/19/2024   CO2 22 03/19/2024   TSH 2.012 01/23/2024   INR 2.2 07/24/2023   HGBA1C 7.3 (H) 01/31/2024   MICROALBUR 10 06/15/2021      Assessment & Plan:  Hypertensive heart and chronic kidney  disease with heart failure and stage 1 through stage 4 chronic kidney disease, or unspecified chronic kidney disease (HCC) Assessment & Plan: Uncontrolled. -Monitor potassium  and sodium levels. -Switch lisinopril  to olmesartan  20 mg daily. Continue to work on eating a healthy diet and exercise.    Orders: -     Olmesartan  Medoxomil; Take 1 tablet (20 mg total) by mouth daily.  Dispense: 30 tablet; Refill: 1  Gastro-esophageal reflux disease without esophagitis Assessment & Plan: Well-controlled.  Continue protonix  40 mg daily.     Subclinical hypothyroidism Assessment & Plan: Management per specialist.    Other microscopic hematuria Assessment & Plan: Check UA Order urine culture.  Orders: -     POCT URINALYSIS DIP (CLINITEK) -     Urine Culture  Hyperkalemia Assessment & Plan: Change lisinopril  to olmesartan  20 mg daily.    Chronic pain of left ankle Assessment & Plan: Continue management by orthopedics.       Meds ordered this encounter  Medications   olmesartan  (BENICAR ) 20 MG tablet    Sig: Take 1 tablet (20 mg total) by mouth daily.    Dispense:  30 tablet    Refill:  1    Orders Placed This Encounter  Procedures   Urine Culture   POCT URINALYSIS DIP (CLINITEK)     Follow-up: Return in about 6 weeks (around 05/08/2024).   I,Marla I Leal-Borjas,acting as a scribe for Abigail Free, MD.,have documented all relevant documentation on the behalf of Abigail Free, MD,as directed by  Abigail Free, MD while in the presence of Abigail Free, MD.    An After Visit Summary was printed and given to the patient.  I attest that I have reviewed this visit and agree with the plan scribed by my staff.  Abigail Free, MD [facility] [phone]

## 2024-03-28 NOTE — Patient Instructions (Addendum)
 Medication Instructions:   Your physician recommends that you continue on your current medications as directed. Please refer to the Current Medication list given to you today.   *If you need a refill on your cardiac medications before your next appointment, please call your pharmacy*  Lab Work:  TODAY!!!!  If you have labs (blood work) drawn today and your tests are completely normal, you will receive your results only by: MyChart Message (if you have MyChart) OR A paper copy in the mail If you have any lab test that is abnormal or we need to change your treatment, we will call you to review the results.  Testing/Procedures:  You have been ordered a 24 hour BP monitoring cuff.  The office will call you to set this up.   Follow-Up: At [facility], you and your health needs are our priority.  As part of our continuing mission to provide you with exceptional heart care, our providers are all part of one team.  This team includes your primary Cardiologist (physician) and Advanced Practice Providers or APPs (Physician Assistants and Nurse Practitioners) who all work together to provide you with the care you need, when you need it.  Your next appointment:   3 month(s)  Provider:   Reche Finder, NP    We recommend signing up for the patient portal called MyChart.  Sign up information is provided on this After Visit Summary.  MyChart is used to connect with patients for Virtual Visits (Telemedicine).  Patients are able to view lab/test results, encounter notes, upcoming appointments, etc.  Non-urgent messages can be sent to your provider as well.   To learn more about what you can do with MyChart, go to [email or web address].   Other Instructions      Be sure to take your doses of Carvedilol  as close to 12 hours apart as possible.

## 2024-03-28 NOTE — Progress Notes (Signed)
 [facility] Initial Assessment:    Date:  03/28/2024   ID:  Pamela [name], DOB 19-Jan-1946, MRN [id number]  PCP:  Sherre Clapper, MD  Cardiologist:  Dub Huntsman, DO  Nephrologist:  Referring MD: Teressa Harrie HERO, FNP   CC: Hypertension  History of Present Illness:    Pamela [name] is a 78 y.o. female with a hx of HTN, DM 2, GERD, HFpEF, protein S deficiency, DVT s/p chronic anticoagulation on Coumadin , HLD, PVC, PAF,TIA, SVT, PVC here to establish care in the [facility].   LHC 12/06/2023 with no coronary artery disease.  Echo 12/08/2023 LVEF 45-50%, no RWMA, LV mild to moderately dilated, mild LVH, grade 1 diastolic dysfunction, RV normal, mild MR, aortic valve sclerosis without stenosis.  Monitor 01/18/2024 ordered due to near syncope with predominantly normal sinus rhythm, less than 1% PAC burden, 2% PVC burden, no significant arrhythmias nor pauses.  Carotid duplex 01/25/2024 with bilateral carotid arteries near normal with only minimal wall thickening or plaque.  Of note, the pt was seen by Dr. Kassie in 2021 for hyponatremia . Adrenal insufficiency was excluded   Discussed the use of AI scribe software for clinical note transcription with the patient, who gave verbal consent to proceed.  History of Present Illness Pamela [name] is a 78 year old female with hypertension who presents for management of labile blood pressure.  Blood pressure readings fluctuate between 140s to 190s mmHg. She experiences chest pressure described as 'like a brick on my chest' without radiation, relieved by sitting and elevating her feet. Home blood pressure readings are consistent with clinic readings.  She has experienced syncope on May 14th and May 19th, with the first incident occurring after a cold sweat in a parking lot and the second in a doctor's office. Blood pressure was recorded as low as 113/52 mmHg in May. She did not eat  breakfast on the day of the first incident.  Subsequent cardiac workup, as above, was unremarkable  Lightheadedness occurs when bending over and standing quickly. Nausea is associated with morning medications, including carvedilol  and lisinopril . Carvedilol  is taken twice daily, and lisinopril  in the morning. Lasix  is taken as needed following elevated BNP for LE edema.   Farxiga  and Imdur  previously stopped in setting of syncope.   Home BP readings: 194/81, 144/66, 192/93, 141/57, 168/65, 156/94, 174/104, 177/102, 162/57, 168/84, 165/87, 143/79, 144/90, 187/111, 157/69,     Previous antihypertensives: Imdur   Past Medical History:  Diagnosis Date   Anticoagulated on Coumadin     chronic--- managed by hematology/ oncology,   Asthma, mild intermittent    followed by pcp--- per pt last exacerbation winter 2021 w/ acute bronchitis   Bilateral leg cramps    Blood dyscrasia    Chronic constipation    CKD (chronic kidney disease), stage III (HCC)    Closed bimalleolar fracture of left ankle 02/26/2020   DDD (degenerative disc disease), cervical    w/ spondylosis,  per pt last steroid injection 06/ 2022   DDD (degenerative disc disease), lumbosacral    DVT (deep venous thrombosis) (HCC) 07/30/2013   Dyspnea    occasionally   Dysrhythmia    GERD (gastroesophageal reflux disease)    History of cardiac murmur as a child    History of DVT of lower extremity    left lower extremity in 1980s, fell when bowling   History of pulmonary embolus (PE) 1993   per pt left lung post op 2 wks  cholecystectomy   History of rheumatic fever as a child    per last echo 12-31-2019 no valvular issues   History of TIA (transient ischemic attack)    2014 and 2018 or 2019,  per pt no residual   HTN (hypertension)    followed by pcp   Hyperlipidemia    Hypothyroidism    IDA (iron deficiency anemia)    Macular degeneration of both eyes    Mild obstructive sleep apnea    per pt dx 2017 tried to uses cpap  but intolerant   Mixed incontinence urge and stress    urologist--- dr marda   OA (osteoarthritis) 07/06/2018   Osteoporosis    PAF (paroxysmal atrial fibrillation) (HCC) 10/01/2014   cardiologist--- dr dub tobb;   cardiac cath 02-18-2013 normal coronaries arteries, ef 50%, cath done since echo showed ef 30-35%; nucleat stress study 04/ 2020 normal , normal echo 04/ 2021,  event monitor 09-14-2020 rare ST/AT variable block   Pneumonia    PONV (postoperative nausea and vomiting)    Protein S deficiency (HCC)    followed by hemotology/ oncology-- dr d. ezzard ([facility]) dx 1980s;  prior DVT left lower leg 1980s and left lung PE 1993; chronic  coumadin  since 1980s   PVC's (premature ventricular contractions)    followed by cardiology   S/P cardiac catheterization 02/2013   Normal coronaries; low normal EF at 50%   Solitary pulmonary nodule on lung CT 02/06/2019   First noted 01/13/2014 > no change as of 12/21/2018   Spondylolisthesis, lumbar region 08/09/2018   Stroke (HCC)    TIA in 2018 or 2019   Transient ischemic attack 07/30/2013   Type 2 diabetes mellitus treated with insulin  (HCC)    endocrilogist--- whitney reardon NP     (03-10-2021  pt continuously checks blood sugar throughout the day w/ Libre, fasting sugar --- 69--200)   Wears glasses    Wears hearing aid in both ears     Past Surgical History:  Procedure Laterality Date   ABDOMINAL HYSTERECTOMY     BLEPHAROPLASTY     CARDIAC CATHETERIZATION  02/18/2013   @ [facility]  by Dr Sw[place];  normal coronaries w/ preserved LVF, ef 50%;   previous cath 2001 normal ef 65%   CARPAL TUNNEL RELEASE Bilateral 1994   CATARACT EXTRACTION W/ INTRAOCULAR LENS IMPLANT Bilateral 2017   CHOLECYSTECTOMY     CHOLECYSTECTOMY, LAPAROSCOPIC  1993   COLONOSCOPY     EAR BIOPSY Left    FINGER SURGERY Left 2018   thumb   FOOT SURGERY     FOOT TENDON SURGERY Right    early 2000s   FRACTURE SURGERY Left 02/13/2020   left femur   HAND  SURGERY Right 04/2022   HARDWARE REMOVAL Left 03/12/2021   Procedure: HARDWARE REMOVAL;  Surgeon: Sharl Selinda Dover, MD;  Location: [facility];  Service: Orthopedics;  Laterality: Left;  60 MINS   JOINT REPLACEMENT     LEFT HEART CATH AND CORONARY ANGIOGRAPHY N/A 12/07/2023   Procedure: LEFT HEART CATH AND CORONARY ANGIOGRAPHY;  Surgeon: Verlin Lonni BIRCH, MD;  Location: [facility] INVASIVE CV [facility];  Service: Cardiovascular;  Laterality: N/A;   LUMBAR LAMINECTOMY/DECOMPRESSION MICRODISCECTOMY N/A 12/22/2022   Procedure: Lumbar Two-Lumbar Three, Lumbar Three-Lumbar Four Lumbar Decompression;  Surgeon: Colon Shove, MD;  Location: [facility] OR;  Service: Neurosurgery;  Laterality: N/A;  RM 20 3C   ORIF ANKLE FRACTURE Left 02/26/2020   Procedure: OPEN REDUCTION INTERNAL FIXATION (ORIF) ANKLE  FRACTURE;  Surgeon: Sharl Selinda Dover, MD;  Location: [facility] OR;  Service: Orthopedics;  Laterality: Left;  90 mins   TONSILLECTOMY AND ADENOIDECTOMY Bilateral    TOTAL KNEE ARTHROPLASTY Left 03/2005   TOTAL VAGINAL HYSTERECTOMY  1988   per pt still has ovaries    Current Medications: Current Meds  Medication Sig   acetaminophen  (TYLENOL ) 650 MG CR tablet Take 650-1,300 mg by mouth every 8 (eight) hours as needed for pain.   albuterol  (VENTOLIN  HFA) 108 (90 Base) MCG/ACT inhaler INHALE 1 TO 2 PUFFS INTO THE LUNGS EVERY 6 HOURS AS NEEDED FOR WHEEZING OR SHORTNESS OF BREATH   aspirin  EC (ASPIRIN  LOW DOSE) 81 MG tablet Take 1 tablet (81 mg total) by mouth daily. Swallow whole.   atorvastatin  (LIPITOR ) 80 MG tablet TAKE 1 TABLET(80 MG) BY MOUTH DAILY   blood glucose meter kit and supplies KIT Dispense based on patient and insurance preference. Use up to four times daily as directed.   carboxymethylcellulose (REFRESH PLUS) 0.5 % SOLN Place 1 drop into both eyes 3 (three) times daily as needed (dry eyes).   carvedilol  (COREG ) 12.5 MG tablet Take 1 tablet (12.5 mg total) by mouth 2 (two) times daily with  a meal.   Cholecalciferol (VITAMIN D3) 50 MCG (2000 UT) TABS Take 4,000 Units by mouth in the morning and at bedtime.   cyanocobalamin  (VITAMIN B12) 1000 MCG tablet Take 1,000 mcg by mouth daily with lunch.   Dulaglutide  (TRULICITY ) 0.75 MG/0.5ML SOAJ Inject 0.75 mg into the skin once a week.   ELIQUIS  5 MG TABS tablet TAKE 1 TABLET(5 MG) BY MOUTH TWICE DAILY   ferrous sulfate  325 (65 FE) MG tablet Take 325 mg by mouth daily with lunch.   furosemide  (LASIX ) 20 MG tablet Take 1 tablet (20 mg total) by mouth daily as needed for fluid or edema.   GEMTESA  75 MG TABS Take 1 tablet (75 mg total) by mouth at bedtime.   insulin  aspart (NOVOLOG  FLEXPEN) 100 UNIT/ML FlexPen Max daily 30 units   lisinopril  (ZESTRIL ) 20 MG tablet Take 1 tablet (20 mg total) by mouth daily.   magnesium  oxide (MAG-OX) 400 (240 Mg) MG tablet TAKE 2 TABLETS(800 MG) BY MOUTH TWICE DAILY   methenamine  (HIPREX ) 1 g tablet Take 1 tablet (1 g total) by mouth 2 (two) times daily with a meal.   nystatin cream (MYCOSTATIN) Apply 1 Application topically 4 (four) times daily as needed for dry skin (yeast infection).   ondansetron  (ZOFRAN -ODT) 8 MG disintegrating tablet Take 8 mg by mouth every 8 (eight) hours as needed for nausea or vomiting.    pantoprazole  (PROTONIX ) 40 MG tablet TAKE 1 TABLET(40 MG) BY MOUTH EVERY MORNING   pyridoxine  (B-6) 100 MG tablet Take 100 mg by mouth daily with lunch.   sennosides-docusate sodium  (SENOKOT-S) 8.6-50 MG tablet Take 1 tablet by mouth every other day.   sodium chloride  (OCEAN) 0.65 % SOLN nasal spray Place 1 spray into both nostrils as needed for congestion.   zolpidem  (AMBIEN ) 5 MG tablet TAKE 1 TABLET(5 MG) BY MOUTH AT BEDTIME AS NEEDED FOR SLEEP     Allergies:   Loxapine succinate, Macrodantin  [nitrofurantoin  macrocrystal], Naproxen sodium, Telithromycin, Amoxapine and related, Darvon, Levothyroxine  sodium, Belsomra [suvorexant], Cymbalta [duloxetine hcl], Duloxetine, Gabapentin , Lyrica   [pregabalin ], Naproxen, Ozempic  (0.25 or 0.5 mg-dose) [semaglutide (0.25 or 0.5mg -dos)], Semaglutide , Amoxicillin , and Propoxyphene   Social History   Socioeconomic History   Marital status: Married    Spouse name: Fairy   Number of  children: Not on file   Years of education: Not on file   Highest education level: Some college, no degree  Occupational History   Not on file  Tobacco Use   Smoking status: Never   Smokeless tobacco: Never  Vaping Use   Vaping status: Never Used  Substance and Sexual Activity   Alcohol use: No   Drug use: Never   Sexual activity: Not on file    Comment: Hysterectomy  Other Topics Concern   Not on file  Social History Narrative   Lives with spouse in [place].  2 grown daughters.   Retired Print production planner     Social Drivers of Corporate investment banker Strain: Low Risk  (10/22/2023)   Overall Financial Resource Strain (CARDIA)    Difficulty of Paying Living Expenses: Not very hard  Food Insecurity: No Food Insecurity (01/23/2024)   Hunger Vital Sign    Worried About Running Out of Food in the Last Year: Never true    Ran Out of Food in the Last Year: Never true  Transportation Needs: No Transportation Needs (01/23/2024)   PRAPARE - Administrator, Civil Service (Medical): No    Lack of Transportation (Non-Medical): No  Physical Activity: Unknown (03/28/2024)   Exercise Vital Sign    Days of Exercise per Week: 7 days    Minutes of Exercise per Session: Not on file  Stress: No Stress Concern Present (10/22/2023)   [employer] of Occupational Health - Occupational Stress Questionnaire    Feeling of Stress : Only a little  Social Connections: Socially Integrated (01/23/2024)   Social Connection and Isolation Panel    Frequency of Communication with Friends and Family: More than three times a week    Frequency of Social Gatherings with Friends and Family: Patient declined    Attends Religious Services: More than 4 times per year     Active Member of Clubs or Organizations: Patient declined    Attends Engineer, structural: More than 4 times per year    Marital Status: Married     Family History: The patient's family history includes Antithrombin III deficiency in her mother; Cirrhosis in her mother; Coronary artery disease in her father; Diabetes in her father and sister; Heart attack in her brother, father, and sister; Hypertension in her brother, father, and sister; Kidney disease in her father; Lung cancer in her brother; Protein S deficiency in her daughter; Ulcerative colitis in her mother. There is no history of Stroke, Colitis, Colon polyps, Esophageal cancer, Liver cancer, Pancreatic cancer, Rectal cancer, Stomach cancer, or Breast cancer.  ROS:   Please see the history of present illness.     All other systems reviewed and are negative.  EKGs/Labs/Other Studies Reviewed:         Recent Labs: 12/20/2023: NT-Pro BNP 524 01/23/2024: TSH 2.012 03/19/2024: ALT 26; BNP 331.4; BUN 22; Creatinine, Ser 0.99; Hemoglobin 11.8; Magnesium  1.8; Platelets 251; Potassium 5.5; Sodium 133   Recent Lipid Panel    Component Value Date/Time   CHOL 125 01/31/2024 0827   TRIG 41 01/31/2024 0827   HDL 61 01/31/2024 0827   CHOLHDL 2.0 01/31/2024 0827   LDLCALC 54 01/31/2024 0827    Physical Exam:   VS:  BP (!) 188/75 (BP Location: Left Arm)   Pulse 64   Ht 5' 1 (1.549 m)   Wt 143 lb 9.6 oz (65.1 kg)   SpO2 100%   BMI 27.13 kg/m  , BMI Body mass  index is 27.13 kg/m.  Vitals:   03/28/24 0950 03/28/24 0952 03/28/24 1027  BP: (!) 202/71 (!) 188/75 (!) 170/78 Comment: right arm manual  Pulse: 64    Height: 5' 1 (1.549 m)    Weight: 143 lb 9.6 oz (65.1 kg)    SpO2: 100%    BMI (Calculated): 27.15      GENERAL:  Well appearing HEENT: Pupils equal round and reactive, fundi not visualized, oral mucosa unremarkable NECK:  No jugular venous distention, waveform within normal limits, carotid upstroke brisk  and symmetric, no bruits, no thyromegaly LYMPHATICS:  No cervical adenopathy LUNGS:  Clear to auscultation bilaterally HEART:  RRR.  PMI not displaced or sustained,S1 and S2 within normal limits, no S3, no S4, no clicks, no rubs, no murmurs ABD:  Flat, positive bowel sounds normal in frequency in pitch, no bruits, no rebound, no guarding, no midline pulsatile mass, no hepatomegaly, no splenomegaly EXT:  2 plus pulses throughout, no edema, no cyanosis no clubbing SKIN:  No rashes no nodules NEURO:  Cranial nerves II through XII grossly intact, motor grossly intact throughout PSYCH:  Cognitively intact, oriented to [name] place and time   ASSESSMENT/PLAN:     Assessment & Plan Hypertension History of near-syncope (cardiac workup unremarkable) and orthostatic hypotension complicate management of hypertension. Blood pressure fluctuates between 140s to 190s. Timing of carvedilol  doses may contribute to fluctuations.  Her BP in clinic today started at 202/71 and improved to 170/78 without intervention. - Order 24-hour blood pressure monitor. - Advise taking carvedilol  closer to a 12-hour interval, with the evening dose around 7 PM. - Renin aldosterone ratio c-Met and magnesium  level today for secondary hypertension workup - Reassess BP via MyChart message in ~2 weeks after adjusting timing of carvedilol  -Continue lisinopril  5 mg daily.  Consider transition to ARB in the future  HFpEF BMP and proBNP today to reassess volume status.  Continue Lasix  20 mg as needed for fluid retention.  DM2 Continue to follow with PCP.   PAF / PVC / Anticoagulation therapy On Eliquis  5mg  BID per primary cardiologist. Denies bleeding complications.       Screening for Secondary Hypertension:     Relevant Labs/Studies:    Latest Ref Rng & Units 03/19/2024   10:57 AM 02/12/2024   12:24 PM 01/31/2024    8:27 AM  Basic Labs  Sodium 134 - 144 mmol/L 133  133  134   Potassium 3.5 - 5.2 mmol/L 5.5  4.9  4.6    Creatinine 0.57 - 1.00 mg/dL 9.00  9.03  9.04        Latest Ref Rng & Units 01/23/2024    5:02 AM 09/27/2023    2:21 PM  Thyroid    TSH 0.350 - 4.500 uIU/mL 2.012  1.97              Latest Ref Rng & Units 01/23/2024    1:38 PM 03/10/2020    4:46 PM 03/10/2020    3:52 PM  Cortisol  Cortisol  ug/dL 6.4  74.9  88.2          Disposition:    FU with MD/APP/PharmD in 3 months    Medication Adjustments/Labs and Tests Ordered: Current medicines are reviewed at length with the patient today.  Concerns regarding medicines are outlined above.  No orders of the defined types were placed in this encounter.  No orders of the defined types were placed in this encounter.    Signed, Reche GORMAN Finder, NP  03/28/2024 10:04 AM    [facility]

## 2024-03-29 ENCOUNTER — Ambulatory Visit: Admitting: Family Medicine

## 2024-03-29 VITALS — BP 152/62 | HR 61 | Temp 98.2°F | Ht 61.0 in | Wt 141.0 lb

## 2024-03-29 DIAGNOSIS — R3129 Other microscopic hematuria: Secondary | ICD-10-CM | POA: Diagnosis not present

## 2024-03-29 DIAGNOSIS — M25572 Pain in left ankle and joints of left foot: Secondary | ICD-10-CM | POA: Diagnosis not present

## 2024-03-29 DIAGNOSIS — I130 Hypertensive heart and chronic kidney disease with heart failure and stage 1 through stage 4 chronic kidney disease, or unspecified chronic kidney disease: Secondary | ICD-10-CM

## 2024-03-29 DIAGNOSIS — G8929 Other chronic pain: Secondary | ICD-10-CM | POA: Diagnosis not present

## 2024-03-29 DIAGNOSIS — E782 Mixed hyperlipidemia: Secondary | ICD-10-CM

## 2024-03-29 DIAGNOSIS — E038 Other specified hypothyroidism: Secondary | ICD-10-CM | POA: Diagnosis not present

## 2024-03-29 DIAGNOSIS — E875 Hyperkalemia: Secondary | ICD-10-CM | POA: Diagnosis not present

## 2024-03-29 DIAGNOSIS — E1140 Type 2 diabetes mellitus with diabetic neuropathy, unspecified: Secondary | ICD-10-CM

## 2024-03-29 DIAGNOSIS — K219 Gastro-esophageal reflux disease without esophagitis: Secondary | ICD-10-CM | POA: Diagnosis not present

## 2024-03-29 LAB — POCT URINALYSIS DIP (CLINITEK)
Bilirubin, UA: NEGATIVE
Glucose, UA: NEGATIVE mg/dL
Ketones, POC UA: NEGATIVE mg/dL
Nitrite, UA: NEGATIVE
Spec Grav, UA: 1.015 (ref 1.010–1.025)
Urobilinogen, UA: 0.2 U/dL
pH, UA: 6 (ref 5.0–8.0)

## 2024-03-29 MED ORDER — OLMESARTAN MEDOXOMIL 20 MG PO TABS: 20.0000 mg | ORAL_TABLET | Freq: Every day | ORAL | 1 refills | Status: DC

## 2024-03-29 NOTE — Patient Instructions (Addendum)
 VISIT SUMMARY:  Today, we discussed your ongoing foot pain and deformity, nocturnal leg and foot cramps, electrolyte abnormalities, nocturia, and occasional burning during urination. We also reviewed your general health maintenance and lab results.  YOUR PLAN:  FOOT PAIN WITH BUNION AND HAMMER TOE: You have significant pain due to bunion and hammer toe with a joint sprain. -Continue using the brace as instructed. -Apply Voltaren gel up to four times a day for pain management. -Follow up with podiatry in six weeks.  HYPERTENSION: Your blood pressure medication, lisinopril , can increase potassium levels. Recent labs showed high potassium and low sodium levels. -Monitor potassium and sodium levels. -Switching lisinopril  to olmesartan 20 mg daily.  NOCTURIA: You wake up three to four times per night to urinate, and current medications seem ineffective. -Consider alternative treatments if current medications are ineffective.  BURNING SENSATION DURING URINATION: You experience occasional burning during urination. -Perform a vaginal exam to assess for atrophy. Unable to take hormone cream.  -Check for urinary tract infection.  LEG AND FOOT CRAMPS: You have leg and foot cramps at night with toe curling. -Checking magnesium  levels. -Recommend hyland's cramps otc.   GENERAL HEALTH MAINTENANCE: Your cholesterol and A1c levels were recently checked, and your A1c is at 7.3%. -Continue regular monitoring of cholesterol and A1c levels. -No need for annual Pap smears. -Consider participation in the Gene Connect DNA study.

## 2024-03-31 ENCOUNTER — Other Ambulatory Visit: Payer: Self-pay | Admitting: Physician Assistant

## 2024-03-31 ENCOUNTER — Ambulatory Visit: Payer: Self-pay | Admitting: Family Medicine

## 2024-03-31 DIAGNOSIS — R3129 Other microscopic hematuria: Secondary | ICD-10-CM | POA: Insufficient documentation

## 2024-03-31 DIAGNOSIS — E875 Hyperkalemia: Secondary | ICD-10-CM

## 2024-03-31 DIAGNOSIS — G8929 Other chronic pain: Secondary | ICD-10-CM | POA: Insufficient documentation

## 2024-03-31 DIAGNOSIS — I10 Essential (primary) hypertension: Secondary | ICD-10-CM

## 2024-03-31 HISTORY — DX: Other chronic pain: G89.29

## 2024-03-31 HISTORY — DX: Hyperkalemia: E87.5

## 2024-03-31 HISTORY — DX: Other microscopic hematuria: R31.29

## 2024-03-31 LAB — URINE CULTURE

## 2024-03-31 NOTE — Assessment & Plan Note (Signed)
Well controlled.  Continue protonix 40 mg daily.

## 2024-03-31 NOTE — Assessment & Plan Note (Deleted)
 Control: fair. Sees endocrinology.  Recommend check sugars before meals and before bed. Has CGM. Recommend check feet daily. Recommend annual eye exams. Medicines: Stopped farxiga  and toujeo  due to low blood sugars.  NovoLog  6 units before each meal, trulicity  0.75 weekly Continue to work on eating a healthy diet and exercise.  Labs reviewed today

## 2024-03-31 NOTE — Assessment & Plan Note (Signed)
 Continue management by orthopedics.

## 2024-03-31 NOTE — Assessment & Plan Note (Signed)
 Management per specialist.

## 2024-03-31 NOTE — Assessment & Plan Note (Signed)
 Check UA Order urine culture

## 2024-03-31 NOTE — Assessment & Plan Note (Addendum)
 Uncontrolled. -Monitor potassium and sodium levels. -Switch lisinopril  to olmesartan  20 mg daily. Continue to work on eating a healthy diet and exercise.

## 2024-03-31 NOTE — Assessment & Plan Note (Signed)
 Change lisinopril  to olmesartan  20 mg daily.

## 2024-04-01 DIAGNOSIS — N390 Urinary tract infection, site not specified: Secondary | ICD-10-CM | POA: Diagnosis not present

## 2024-04-01 DIAGNOSIS — I130 Hypertensive heart and chronic kidney disease with heart failure and stage 1 through stage 4 chronic kidney disease, or unspecified chronic kidney disease: Secondary | ICD-10-CM | POA: Diagnosis not present

## 2024-04-01 DIAGNOSIS — E1122 Type 2 diabetes mellitus with diabetic chronic kidney disease: Secondary | ICD-10-CM | POA: Diagnosis not present

## 2024-04-01 DIAGNOSIS — S93402D Sprain of unspecified ligament of left ankle, subsequent encounter: Secondary | ICD-10-CM | POA: Diagnosis not present

## 2024-04-01 DIAGNOSIS — N1832 Chronic kidney disease, stage 3b: Secondary | ICD-10-CM | POA: Diagnosis not present

## 2024-04-01 DIAGNOSIS — D631 Anemia in chronic kidney disease: Secondary | ICD-10-CM | POA: Diagnosis not present

## 2024-04-01 DIAGNOSIS — I5042 Chronic combined systolic (congestive) and diastolic (congestive) heart failure: Secondary | ICD-10-CM | POA: Diagnosis not present

## 2024-04-01 DIAGNOSIS — E1140 Type 2 diabetes mellitus with diabetic neuropathy, unspecified: Secondary | ICD-10-CM | POA: Diagnosis not present

## 2024-04-01 DIAGNOSIS — B952 Enterococcus as the cause of diseases classified elsewhere: Secondary | ICD-10-CM | POA: Diagnosis not present

## 2024-04-02 ENCOUNTER — Telehealth: Payer: Self-pay | Admitting: Cardiology

## 2024-04-02 LAB — COMPREHENSIVE METABOLIC PANEL WITH GFR
ALT: 25 IU/L (ref 0–32)
AST: 31 IU/L (ref 0–40)
Albumin: 4.1 g/dL (ref 3.8–4.8)
Alkaline Phosphatase: 114 IU/L (ref 44–121)
BUN/Creatinine Ratio: 30 — ABNORMAL HIGH (ref 12–28)
BUN: 28 mg/dL — ABNORMAL HIGH (ref 8–27)
Bilirubin Total: 0.3 mg/dL (ref 0.0–1.2)
CO2: 22 mmol/L (ref 20–29)
Calcium: 9.4 mg/dL (ref 8.7–10.3)
Chloride: 101 mmol/L (ref 96–106)
Creatinine, Ser: 0.92 mg/dL (ref 0.57–1.00)
Globulin, Total: 2.1 g/dL (ref 1.5–4.5)
Glucose: 120 mg/dL — ABNORMAL HIGH (ref 70–99)
Potassium: 4.7 mmol/L (ref 3.5–5.2)
Sodium: 138 mmol/L (ref 134–144)
Total Protein: 6.2 g/dL (ref 6.0–8.5)
eGFR: 63 mL/min/1.73 (ref >59)

## 2024-04-02 LAB — ALDOSTERONE + RENIN ACTIVITY W/ RATIO
Aldos/Renin Ratio: 11.2 (ref 0.0–30.0)
Aldosterone: 4.7 ng/dL (ref 0.0–30.0)
Renin Activity, Plasma: 0.421 ng/mL/h (ref 0.167–5.380)

## 2024-04-02 LAB — PRO B NATRIURETIC PEPTIDE: NT-Pro BNP: 930 pg/mL — ABNORMAL HIGH (ref 0–738)

## 2024-04-02 LAB — MAGNESIUM: Magnesium: 1.8 mg/dL (ref 1.6–2.3)

## 2024-04-02 NOTE — Telephone Encounter (Signed)
  Pt c/o BP issue: STAT if pt c/o blurred vision, one-sided weakness or slurred speech.  STAT if BP is GREATER than 180/120 TODAY.  STAT if BP is LESS than 90/60 and SYMPTOMATIC TODAY  1. What is your BP concern? 200/90 HR 68  2. Have you taken any BP medication today?yes  3. What are your last 5 BP readings?158/82 - 202/117  4. Are you having any other symptoms (ex. Dizziness, headache, blurred vision, passed out)? blurred vision and extremely tired

## 2024-04-02 NOTE — Telephone Encounter (Signed)
 Patient called in requested lab work results ordered by dr. Sheena and Reche Finder, NP drawn [facility]. Made patient aware that results are not available, but this will be forward to providers for review. Made patient aware that this will be forward to provider for lab work results. Patient reports blood pressure has been elevated since yesterday. Patient denies dizziness, lightheadedness or headache. Reports blurred vision and extremely tired. Patient reports drinking adequately. Patient reports eating meal from [place] [employer] @11 :30 today. Appt schedule with Reche Finder 8/4 @2 :45 at [facility]. Advise patient of ED precautions and advise patient to go to Emergency Room if symptoms worsen. Patient verbalized if her symptoms worsen she will go to Emergency Room. Understanding verbalized   7/28  Blood pressure @6 :30  202/117,  7:30 202/106, took medications 10:20 164/87 7/29 7:15 am 171/101- took medications 5:00pm 200/90

## 2024-04-03 ENCOUNTER — Ambulatory Visit: Payer: Medicare PPO | Admitting: Family Medicine

## 2024-04-03 ENCOUNTER — Other Ambulatory Visit: Payer: Self-pay | Admitting: Family Medicine

## 2024-04-03 ENCOUNTER — Other Ambulatory Visit (HOSPITAL_BASED_OUTPATIENT_CLINIC_OR_DEPARTMENT_OTHER): Payer: Self-pay | Admitting: *Deleted

## 2024-04-03 ENCOUNTER — Ambulatory Visit (HOSPITAL_BASED_OUTPATIENT_CLINIC_OR_DEPARTMENT_OTHER): Payer: Self-pay | Admitting: Family

## 2024-04-03 ENCOUNTER — Encounter (HOSPITAL_BASED_OUTPATIENT_CLINIC_OR_DEPARTMENT_OTHER): Payer: Self-pay | Admitting: Family

## 2024-04-03 VITALS — Ht 61.0 in | Wt 140.0 lb

## 2024-04-03 DIAGNOSIS — Z0000 Encounter for general adult medical examination without abnormal findings: Secondary | ICD-10-CM | POA: Diagnosis not present

## 2024-04-03 DIAGNOSIS — I130 Hypertensive heart and chronic kidney disease with heart failure and stage 1 through stage 4 chronic kidney disease, or unspecified chronic kidney disease: Secondary | ICD-10-CM

## 2024-04-03 DIAGNOSIS — I5021 Acute systolic (congestive) heart failure: Secondary | ICD-10-CM

## 2024-04-03 MED ORDER — OLMESARTAN MEDOXOMIL 20 MG PO TABS: 40.0000 mg | ORAL_TABLET | Freq: Every day | ORAL | Status: DC

## 2024-04-03 NOTE — Progress Notes (Unsigned)
 Subjective:   Pamela [name] is a 78 y.o. female who presents for Medicare Annual (Subsequent) preventive examination.  Visit Complete: Virtual I connected with  Pamela [name] on 04/04/24 by a audio enabled telemedicine application and verified that I am speaking with the correct person using two identifiers.  Patient Location: Home  Provider Location: Office/Clinic  I discussed the limitations of evaluation and management by telemedicine. The patient expressed understanding and agreed to proceed.  Vital Signs: Because this visit was a virtual/telehealth visit, some criteria may be missing or patient reported. Any vitals not documented were not able to be obtained and vitals that have been documented are patient reported.  Patient Medicare AWV questionnaire was completed by the patient; I have confirmed that all information answered by patient is correct and no changes since this date.  Cardiac Risk Factors include: advanced age (>16men, >74 women);dyslipidemia;hypertension     Objective:    Today's Vitals   04/03/24 1000  Weight: 140 lb (63.5 kg)  Height: 5' 1 (1.549 m)   Body mass index is 26.45 kg/m.     04/03/2024   10:00 AM 01/22/2024    3:59 PM 12/08/2023    2:00 PM 12/07/2023    8:41 AM 06/06/2023   10:30 AM 01/26/2023    3:00 PM 12/24/2022    4:34 PM  Advanced Directives  Does Patient Have a Medical Advance Directive? Yes Yes Yes Yes No No No  Type of Estate agent of [place];Living will Healthcare Power of [place];Living will Healthcare Power of [employer] of [place];Living will     Does patient want to make changes to medical advance directive? No - Patient declined No - Patient declined No - Patient declined      Copy of Healthcare Power of Attorney in Chart? No - copy requested  No - copy requested No - copy requested     Would patient like information on creating a medical advance directive?      No  - Patient declined No - Patient declined    Current Medications (verified) Outpatient Encounter Medications as of 04/03/2024  Medication Sig   acetaminophen  (TYLENOL ) 650 MG CR tablet Take 650-1,300 mg by mouth every 8 (eight) hours as needed for pain.   albuterol  (VENTOLIN  HFA) 108 (90 Base) MCG/ACT inhaler INHALE 1 TO 2 PUFFS INTO THE LUNGS EVERY 6 HOURS AS NEEDED FOR WHEEZING OR SHORTNESS OF BREATH   aspirin  EC (ASPIRIN  LOW DOSE) 81 MG tablet Take 1 tablet (81 mg total) by mouth daily. Swallow whole.   atorvastatin  (LIPITOR ) 80 MG tablet TAKE 1 TABLET(80 MG) BY MOUTH DAILY   blood glucose meter kit and supplies KIT Dispense based on patient and insurance preference. Use up to four times daily as directed.   carboxymethylcellulose (REFRESH PLUS) 0.5 % SOLN Place 1 drop into both eyes 3 (three) times daily as needed (dry eyes).   carvedilol  (COREG ) 12.5 MG tablet TAKE 1 TABLET(12.5 MG) BY MOUTH TWICE DAILY WITH A MEAL   Cholecalciferol (VITAMIN D3) 50 MCG (2000 UT) TABS Take 4,000 Units by mouth in the morning and at bedtime.   cyanocobalamin  (VITAMIN B12) 1000 MCG tablet Take 1,000 mcg by mouth daily with lunch.   Dulaglutide  (TRULICITY ) 0.75 MG/0.5ML SOAJ Inject 0.75 mg into the skin once a week.   ELIQUIS  5 MG TABS tablet TAKE 1 TABLET(5 MG) BY MOUTH TWICE DAILY   ferrous sulfate  325 (65 FE) MG tablet Take 325 mg by  mouth daily with lunch.   GEMTESA  75 MG TABS Take 1 tablet (75 mg total) by mouth at bedtime.   insulin  aspart (NOVOLOG  FLEXPEN) 100 UNIT/ML FlexPen Max daily 30 units   magnesium  oxide (MAG-OX) 400 (240 Mg) MG tablet TAKE 2 TABLETS(800 MG) BY MOUTH TWICE DAILY   methenamine  (HIPREX ) 1 g tablet Take 1 tablet (1 g total) by mouth 2 (two) times daily with a meal.   nystatin cream (MYCOSTATIN) Apply 1 Application topically 4 (four) times daily as needed for dry skin (yeast infection).   ondansetron  (ZOFRAN -ODT) 8 MG disintegrating tablet Take 8 mg by mouth every 8 (eight) hours as  needed for nausea or vomiting.    pantoprazole  (PROTONIX ) 40 MG tablet TAKE 1 TABLET(40 MG) BY MOUTH EVERY MORNING   pyridoxine  (B-6) 100 MG tablet Take 100 mg by mouth daily with lunch.   sennosides-docusate sodium  (SENOKOT-S) 8.6-50 MG tablet Take 1 tablet by mouth every other day.   sodium chloride  (OCEAN) 0.65 % SOLN nasal spray Place 1 spray into both nostrils as needed for congestion.   zolpidem  (AMBIEN ) 5 MG tablet TAKE 1 TABLET(5 MG) BY MOUTH AT BEDTIME AS NEEDED FOR SLEEP   [DISCONTINUED] furosemide  (LASIX ) 20 MG tablet Take 1 tablet (20 mg total) by mouth daily as needed for fluid or edema.   [DISCONTINUED] olmesartan  (BENICAR ) 20 MG tablet Take 1 tablet (20 mg total) by mouth daily.   No facility-administered encounter medications on file as of 04/03/2024.    Allergies (verified) Loxapine succinate, Macrodantin  [nitrofurantoin  macrocrystal], Naproxen sodium, Telithromycin, Amoxapine and related, Darvon, Levothyroxine  sodium, Belsomra [suvorexant], Cymbalta [duloxetine hcl], Duloxetine, Gabapentin , Lyrica  [pregabalin ], Naproxen, Ozempic  (0.25 or 0.5 mg-dose) [semaglutide (0.25 or 0.5mg -dos)], Semaglutide , Amoxicillin , and Propoxyphene   History: Past Medical History:  Diagnosis Date   Anticoagulated on Coumadin     chronic--- managed by hematology/ oncology,   Asthma, mild intermittent    followed by pcp--- per pt last exacerbation winter 2021 w/ acute bronchitis   Bilateral leg cramps    Blood dyscrasia    Chronic constipation    CKD (chronic kidney disease), stage III (HCC)    Closed bimalleolar fracture of left ankle 02/26/2020   DDD (degenerative disc disease), cervical    w/ spondylosis,  per pt last steroid injection 06/ 2022   DDD (degenerative disc disease), lumbosacral    DVT (deep venous thrombosis) (HCC) 07/30/2013   Dyspnea    occasionally   Dysrhythmia    GERD (gastroesophageal reflux disease)    History of cardiac murmur as a child    History of DVT of lower  extremity    left lower extremity in 1980s, fell when bowling   History of pulmonary embolus (PE) 1993   per pt left lung post op 2 wks cholecystectomy   History of rheumatic fever as a child    per last echo 12-31-2019 no valvular issues   History of TIA (transient ischemic attack)    2014 and 2018 or 2019,  per pt no residual   HTN (hypertension)    followed by pcp   Hyperlipidemia    Hypothyroidism    IDA (iron deficiency anemia)    Macular degeneration of both eyes    Mild obstructive sleep apnea    per pt dx 2017 tried to uses cpap but intolerant   Mixed incontinence urge and stress    urologist--- dr marda   OA (osteoarthritis) 07/06/2018   Osteoporosis    PAF (paroxysmal atrial fibrillation) (HCC) 10/01/2014   cardiologist---  dr dub tobb;   cardiac cath 02-18-2013 normal coronaries arteries, ef 50%, cath done since echo showed ef 30-35%; nucleat stress study 04/ 2020 normal , normal echo 04/ 2021,  event monitor 09-14-2020 rare ST/AT variable block   Pneumonia    PONV (postoperative nausea and vomiting)    Protein S deficiency (HCC)    followed by hemotology/ oncology-- dr d. ezzard ([facility]) dx 1980s;  prior DVT left lower leg 1980s and left lung PE 1993; chronic  coumadin  since 1980s   PVC's (premature ventricular contractions)    followed by cardiology   S/P cardiac catheterization 02/2013   Normal coronaries; low normal EF at 50%   Solitary pulmonary nodule on lung CT 02/06/2019   First noted 01/13/2014 > no change as of 12/21/2018   Spondylolisthesis, lumbar region 08/09/2018   Stroke (HCC)    TIA in 2018 or 2019   Transient ischemic attack 07/30/2013   Type 2 diabetes mellitus treated with insulin  (HCC)    endocrilogist--- whitney reardon NP     (03-10-2021  pt continuously checks blood sugar throughout the day w/ Libre, fasting sugar --- 69--200)   Wears glasses    Wears hearing aid in both ears    Past Surgical History:  Procedure Laterality  Date   ABDOMINAL HYSTERECTOMY     BLEPHAROPLASTY     CARDIAC CATHETERIZATION  02/18/2013   @ [facility]  by Dr Swaziland;  normal coronaries w/ preserved LVF, ef 50%;   previous cath 2001 normal ef 65%   CARPAL TUNNEL RELEASE Bilateral 1994   CATARACT EXTRACTION W/ INTRAOCULAR LENS IMPLANT Bilateral 2017   CHOLECYSTECTOMY     CHOLECYSTECTOMY, LAPAROSCOPIC  1993   COLONOSCOPY     EAR BIOPSY Left    FINGER SURGERY Left 2018   thumb   FOOT SURGERY     FOOT TENDON SURGERY Right    early 2000s   FRACTURE SURGERY Left 02/13/2020   left femur   HAND SURGERY Right 04/2022   HARDWARE REMOVAL Left 03/12/2021   Procedure: HARDWARE REMOVAL;  Surgeon: Sharl Selinda Dover, MD;  Location: [facility];  Service: Orthopedics;  Laterality: Left;  60 MINS   JOINT REPLACEMENT     LEFT HEART CATH AND CORONARY ANGIOGRAPHY N/A 12/07/2023   Procedure: LEFT HEART CATH AND CORONARY ANGIOGRAPHY;  Surgeon: Verlin Lonni BIRCH, MD;  Location: [facility] INVASIVE CV LAB;  Service: Cardiovascular;  Laterality: N/A;   LUMBAR LAMINECTOMY/DECOMPRESSION MICRODISCECTOMY N/A 12/22/2022   Procedure: Lumbar Two-Lumbar Three, Lumbar Three-Lumbar Four Lumbar Decompression;  Surgeon: Colon Shove, MD;  Location: [facility] OR;  Service: Neurosurgery;  Laterality: N/A;  RM 20 3C   ORIF ANKLE FRACTURE Left 02/26/2020   Procedure: OPEN REDUCTION INTERNAL FIXATION (ORIF) ANKLE FRACTURE;  Surgeon: Sharl Selinda Dover, MD;  Location: [facility];  Service: Orthopedics;  Laterality: Left;  90 mins   TONSILLECTOMY AND ADENOIDECTOMY Bilateral    TOTAL KNEE ARTHROPLASTY Left 03/2005   TOTAL VAGINAL HYSTERECTOMY  1988   per pt still has ovaries   Family History  Problem Relation Age of Onset   Cirrhosis Mother    Antithrombin III deficiency Mother        multiple emboli   Ulcerative colitis Mother    Diabetes Father    Coronary artery disease Father    Heart attack Father    Hypertension Father    Kidney disease Father    Hypertension  Sister    Diabetes Sister    Heart attack  Sister        age 19    Protein S deficiency Daughter    Heart attack Brother    Hypertension Brother    Lung cancer Brother    Stroke Neg Hx    Colitis Neg Hx    Colon polyps Neg Hx    Esophageal cancer Neg Hx    Liver cancer Neg Hx    Pancreatic cancer Neg Hx    Rectal cancer Neg Hx    Stomach cancer Neg Hx    Breast cancer Neg Hx    Social History   Socioeconomic History   Marital status: Married    Spouse name: Fairy   Number of children: Not on file   Years of education: Not on file   Highest education level: Some college, no degree  Occupational History   Not on file  Tobacco Use   Smoking status: Never   Smokeless tobacco: Never  Vaping Use   Vaping status: Never Used  Substance and Sexual Activity   Alcohol use: No   Drug use: Never   Sexual activity: Not on file    Comment: Hysterectomy  Other Topics Concern   Not on file  Social History Narrative   Lives with spouse in [place].  2 grown daughters.   Retired Print production planner     Social Drivers of Corporate investment banker Strain: Low Risk  (03/29/2024)   Overall Financial Resource Strain (CARDIA)    Difficulty of Paying Living Expenses: Not very hard  Food Insecurity: No Food Insecurity (03/29/2024)   Hunger Vital Sign    Worried About Running Out of Food in the Last Year: Never true    Ran Out of Food in the Last Year: Never true  Transportation Needs: No Transportation Needs (03/29/2024)   PRAPARE - Administrator, Civil Service (Medical): No    Lack of Transportation (Non-Medical): No  Physical Activity: Inactive (03/29/2024)   Exercise Vital Sign    Days of Exercise per Week: 0 days    Minutes of Exercise per Session: 0 min  Stress: No Stress Concern Present (03/29/2024)   [employer] of Occupational Health - Occupational Stress Questionnaire    Feeling of Stress: Only a little  Social Connections: Socially Integrated (03/29/2024)    Social Connection and Isolation Panel    Frequency of Communication with Friends and Family: More than three times a week    Frequency of Social Gatherings with Friends and Family: Once a week    Attends Religious Services: More than 4 times per year    Active Member of [employer] or Organizations: Yes    Attends Banker Meetings: More than 4 times per year    Marital Status: Married    Tobacco Counseling Counseling given: Not Answered    Activities of Daily Living    04/03/2024   10:00 AM 01/23/2024    2:36 PM  In your present state of health, do you have any difficulty performing the following activities:  Hearing? 0 1  Vision? 0 1  Difficulty concentrating or making decisions? 0 1  Walking or climbing stairs? 0   Dressing or bathing? 0   Doing errands, shopping? 0   Preparing Food and eating ? N   Using the Toilet? N   In the past six months, have you accidently leaked urine? N   Do you have problems with loss of bowel control? N   Managing your Medications? N   Managing  your Finances? N   Housekeeping or managing your Housekeeping? N     Patient Care Team: Sherre Clapper, MD as PCP - General (Internal Medicine) Tobb, Kardie, DO as PCP - Cardiology (Cardiology) Ezzard Valaria LABOR, MD as Consulting Physician (Oncology) Arloa Brunner, OD (Optometry) Ortho, Emerge (Specialist) Darlean Ozell NOVAK, MD as Consulting Physician (Pulmonary Disease) Ethyl Lonni BRAVO, MD (Inactive) as Consulting Physician (Otolaryngology) Tobb, Kardie, DO as Consulting Physician (Cardiology) Kassie Mallick, MD (Inactive) as Consulting Physician (Endocrinology) Ishmael Slough, MD as Consulting Physician (Rheumatology) Armbruster, Elspeth SQUIBB, MD as Consulting Physician (Gastroenterology) Arloa Brunner, OD (Optometry)  Indicate any recent Medical Services you may have received from other than [facility] providers in the past year (date may be approximate).     Assessment:   This is a routine  wellness examination for Yasaman.  Hearing/Vision screen No results found.   Goals Addressed             This Visit's Progress    Activity and Exercise Increased       Evidence-based guidance:  Review current exercise levels.  Assess patient perspective on exercise or activity level, barriers to increasing activity, motivation and readiness for change.  Recommend or set healthy exercise goal based on individual tolerance.  Encourage small steps toward making change in amount of exercise or activity.  Urge reduction of sedentary activities or screen time.  Promote group activities within the community or with family or support person.  Consider referral to rehabiliation therapist for assessment and exercise/activity plan.   Notes:        Depression Screen    03/29/2024   10:25 AM 04/17/2023    8:55 AM 03/30/2023    2:14 PM 12/14/2022    9:06 AM 02/15/2022    1:16 PM 01/24/2022    8:21 AM 11/16/2021   11:37 AM  PHQ 2/9 Scores  PHQ - 2 Score 0 1 0 0 0 0 0  PHQ- 9 Score 8 8         Fall Risk    03/29/2024   10:25 AM 10/31/2023    3:50 PM 09/27/2023    1:13 PM 04/17/2023    8:55 AM 03/30/2023    2:20 PM  Fall Risk   Falls in the past year? 1 0 0 1 1  Number falls in past yr: 1 0  1 0  Injury with Fall? 1 0  1 1  Comment     Fx rt foot. Followed by medical attention  Risk for fall due to : Impaired balance/gait No Fall Risks  History of fall(s);Impaired balance/gait No Fall Risks  Follow up Falls evaluation completed Falls evaluation completed  Falls evaluation completed;Falls prevention discussed Falls prevention discussed    MEDICARE RISK AT HOME: Medicare Risk at Home Any stairs in or around the home?: Yes If so, are there any without handrails?: Yes Home free of loose throw rugs in walkways, pet beds, electrical cords, etc?: Yes Adequate lighting in your home to reduce risk of falls?: Yes Life alert?: No Use of a cane, walker or w/c?: Yes Grab bars in the bathroom?:  Yes Shower chair or bench in shower?: Yes Elevated toilet seat or a handicapped toilet?: Yes  TIMED UP AND GO:  Was the test performed?  No    Cognitive Function:    10/18/2021    8:49 AM  MMSE - Mini Mental State Exam  Orientation to time 5  Orientation to Place 5  Registration 3  Attention/ Calculation  5  Recall 2  Language- name 2 objects 2  Language- repeat 1  Language- follow 3 step command 3  Language- read & follow direction 1  Write a sentence 1  Copy design 1  Total score 29        04/03/2024   10:02 AM 03/30/2023    2:21 PM 02/21/2022    1:17 PM  6CIT Screen  What Year? 0 points 0 points 0 points  What month? 0 points 0 points 0 points  What time? 0 points 0 points 0 points  Count back from 20 0 points 0 points 0 points  Months in reverse 0 points 0 points 2 points  Repeat phrase 2 points 0 points 2 points  Total Score 2 points 0 points 4 points    Immunizations Immunization History  Administered Date(s) Administered   Fluad Quad(high Dose 65+) 05/26/2020, 05/13/2021, 06/09/2022   Fluad Trivalent(High Dose 65+) 07/17/2023   Influenza,inj,Quad PF,6+ Mos 06/04/2013   PFIZER Comirnaty(Gray Top)Covid-19 Tri-Sucrose Vaccine 02/04/2021   PFIZER(Purple Top)SARS-COV-2 Vaccination 09/22/2019, 10/12/2019, 06/16/2020   PNEUMOCOCCAL CONJUGATE-20 02/15/2022   Pfizer Covid-19 Vaccine Bivalent Booster 57yrs & up 08/17/2021   Pfizer(Comirnaty)Fall Seasonal Vaccine 12 years and older 09/07/2022, 07/17/2023   Pneumococcal Conjugate-13 07/30/2014   Pneumococcal Polysaccharide-23 03/31/2008   Tdap 11/14/2013   Zoster, Live 03/31/2008, 06/03/2011    Screening Tests Health Maintenance  Topic Date Due   Zoster Vaccines- Shingrix (1 of 2) 09/18/1963   DTaP/Tdap/Td (2 - Td or Tdap) 11/15/2023   COVID-19 Vaccine (8 - Pfizer risk 2024-25 season) 01/14/2024   INFLUENZA VACCINE  04/05/2024   MAMMOGRAM  04/11/2024   FOOT EXAM  07/16/2024   HEMOGLOBIN A1C  08/02/2024    OPHTHALMOLOGY EXAM  09/04/2024   Diabetic kidney evaluation - Urine ACR  01/30/2025   Diabetic kidney evaluation - eGFR measurement  03/28/2025   Medicare Annual Wellness (AWV)  04/04/2025   Pneumococcal Vaccine: 50+ Years  Completed   DEXA SCAN  Completed   Hepatitis C Screening  Completed   Hepatitis B Vaccines  Aged Out   HPV VACCINES  Aged Out   Meningococcal B Vaccine  Aged Out   Colonoscopy  Discontinued    Health Maintenance  Health Maintenance Due  Topic Date Due   Zoster Vaccines- Shingrix (1 of 2) 09/18/1963   DTaP/Tdap/Td (2 - Td or Tdap) 11/15/2023   COVID-19 Vaccine (8 - Pfizer risk 2024-25 season) 01/14/2024   Additional Screening:  Vision Screening: Recommended annual ophthalmology exams for early detection of glaucoma and other disorders of the eye. Is the patient up to date with their annual eye exam?  Yes  Who is the provider or what is the name of the office in which the patient attends annual eye exams? Dr. Arloa and Dr. Naomi, [facility]   Dental Screening: Recommended annual dental exams for proper oral hygiene  Community Resource Referral / Chronic Care Management: CRR required this visit?  No   CCM required this visit?  No     Plan:    Encounter for Medicare annual wellness exam Assessment & Plan: Education given     I have personally reviewed and noted the following in the patient's chart:   Medical and social history Use of alcohol, tobacco or illicit drugs  Current medications and supplements including opioid prescriptions. Patient is not currently taking opioid prescriptions. Functional ability and status Nutritional status Physical activity Advanced directives List of other physicians Hospitalizations, surgeries, and ER visits in previous 12  months Vitals Screenings to include cognitive, depression, and falls Referrals and appointments  In addition, I have reviewed and discussed with patient certain preventive protocols,  quality metrics, and best practice recommendations. A written personalized care plan for preventive services as well as general preventive health recommendations were provided to patient.      I attest that I have reviewed this visit and agree with the plan scribed by my staff.   Abigail Free, MD [facility] [phone]

## 2024-04-03 NOTE — Telephone Encounter (Signed)
 S/w pt is aware of lab results and recommendations.  Updated medication list. Pt did not need a new script for benicar  just picked up.

## 2024-04-03 NOTE — Telephone Encounter (Signed)
 Please see lab results from today. Normal kidney/liver function and electrolytes.  No evidence of hyperaldosteronism.  proBNP with volume overload.  Recommend Lasix  20 mg daily x 3 days. Recommend increase Olmesartan  to 40mg  daily.    Gracie Gupta S Isaac Lacson, NP

## 2024-04-04 DIAGNOSIS — Z0000 Encounter for general adult medical examination without abnormal findings: Secondary | ICD-10-CM | POA: Insufficient documentation

## 2024-04-04 HISTORY — DX: Encounter for general adult medical examination without abnormal findings: Z00.00

## 2024-04-04 NOTE — Assessment & Plan Note (Signed)
Education given. 

## 2024-04-08 ENCOUNTER — Ambulatory Visit (HOSPITAL_BASED_OUTPATIENT_CLINIC_OR_DEPARTMENT_OTHER): Admitting: Family

## 2024-04-08 ENCOUNTER — Encounter (HOSPITAL_BASED_OUTPATIENT_CLINIC_OR_DEPARTMENT_OTHER): Payer: Self-pay | Admitting: Family

## 2024-04-08 VITALS — BP 180/52 | HR 55 | Ht 60.0 in | Wt 149.0 lb

## 2024-04-08 DIAGNOSIS — I10 Essential (primary) hypertension: Secondary | ICD-10-CM | POA: Diagnosis not present

## 2024-04-08 MED ORDER — AMLODIPINE BESYLATE 5 MG PO TABS: 5.0000 mg | ORAL_TABLET | Freq: Every day | ORAL | 5 refills | Status: DC

## 2024-04-08 NOTE — Progress Notes (Signed)
 Advanced Hypertension Clinic Assessment:    Date:  04/08/2024   ID:  Pamela [name], DOB 01-03-1946, MRN [id number]  PCP:  Sherre Clapper, MD  Cardiologist:  Dub Huntsman, DO  Nephrologist:  Referring MD: Sherre Clapper, MD   CC: Hypertension  History of Present Illness:    Pamela [name] is a 78 y.o. female with a hx of HTN, DM 2, GERD, HFpEF, protein S deficiency, DVT s/p chronic anticoagulation on Coumadin , HLD, PVC, PAF,TIA, SVT, PVC here to follow up in the Advanced Hypertension Clinic.   LHC 12/06/2023 with no coronary artery disease.  Echo 12/08/2023 LVEF 45-50%, no RWMA, LV mild to moderately dilated, mild LVH, grade 1 diastolic dysfunction, RV normal, mild MR, aortic valve sclerosis without stenosis.  Monitor 01/18/2024 ordered due to near syncope with predominantly normal sinus rhythm, less than 1% PAC burden, 2% PVC burden, no significant arrhythmias nor pauses.  Carotid duplex 01/25/2024 with bilateral carotid arteries near normal with only minimal wall thickening or plaque.  Of note, the pt was seen by Dr. Kassie in 2021 for hyponatremia . Adrenal insufficiency was excluded   Discussed the use of AI scribe software for clinical note transcription with the patient, who gave verbal consent to proceed.  History of Present Illness At visit 03/28/24 BP readings fluctuate between 140s to 190s mmHg. She experiences chest pressure described as 'like a brick on my chest' without radiation, relieved by sitting and elevating her feet. Episodes of syncope May 14 and May 19th with BP as low as 113/52 mmHg. She did not eat breakfast on the day of the first incident.  Subsequent cardiac workup, as above, was unremarkable. A 24 h BP monitor was recommended due to labile hypertension. Farxig and Imdur  were previously stopped in setting of syncope.   At visit 03/29/24 with primary care Lisinopril  transitioned to Olmesartan  20mg  daily.   Labs 03/28/24 no evidence of  hyperaldosteronism. Due to elevated proBNP 930 Lasix  added 20mg  daily x 3 days. Due to elevated BP, olmesartan  increased to 40mg  daily.   Pamela [name] is a 78 year old female with hypertension who presents with uncontrolled blood pressure.  She experiences significant fluctuations in blood pressure, with readings from 195/90 to 127/56. Blood pressure is higher in the left arm, with recent readings of 175/75 in the right arm and 168/78 in the left arm.   Her current medication regimen includes olmesartan  40 mg daily, increased from 20 mg, and carvedilol  taken in divided doses. Adjusting carvedilol  timing has not significantly altered her symptoms. She used a diuretic three times last week without notable changes in blood pressure.  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    Previous antihypertensives: Imdur   Past Medical History:  Diagnosis Date   Anticoagulated on Coumadin     chronic--- managed by hematology/ oncology,   Asthma, mild intermittent    followed by pcp--- per pt last exacerbation winter 2021 w/ acute bronchitis   Bilateral leg cramps    Blood dyscrasia    Chronic constipation    CKD (chronic kidney disease), stage III (HCC)    Closed bimalleolar fracture of left ankle 02/26/2020   DDD (degenerative disc disease), cervical    w/ spondylosis,  per pt last steroid injection 06/ 2022   DDD (degenerative disc disease), lumbosacral    DVT (deep venous thrombosis) (HCC) 07/30/2013   Dyspnea    occasionally   Dysrhythmia    GERD (gastroesophageal reflux disease)  History of cardiac murmur as a child    History of DVT of lower extremity    left lower extremity in 1980s, fell when bowling   History of pulmonary embolus (PE) 1993   per pt left lung post op 2 wks cholecystectomy   History of rheumatic fever as a child    per last echo 12-31-2019 no valvular issues   History of TIA (transient ischemic attack)    2014 and  2018 or 2019,  per pt no residual   HTN (hypertension)    followed by pcp   Hyperlipidemia    Hypothyroidism    IDA (iron deficiency anemia)    Macular degeneration of both eyes    Mild obstructive sleep apnea    per pt dx 2017 tried to uses cpap but intolerant   Mixed incontinence urge and stress    urologist--- dr marda   OA (osteoarthritis) 07/06/2018   Osteoporosis    PAF (paroxysmal atrial fibrillation) (HCC) 10/01/2014   cardiologist--- dr dub tobb;   cardiac cath 02-18-2013 normal coronaries arteries, ef 50%, cath done since echo showed ef 30-35%; nucleat stress study 04/ 2020 normal , normal echo 04/ 2021,  event monitor 09-14-2020 rare ST/AT variable block   Pneumonia    PONV (postoperative nausea and vomiting)    Protein S deficiency (HCC)    followed by hemotology/ oncology-- dr d. ezzard ([facility]) dx 1980s;  prior DVT left lower leg 1980s and left lung PE 1993; chronic  coumadin  since 1980s   PVC's (premature ventricular contractions)    followed by cardiology   S/P cardiac catheterization 02/2013   Normal coronaries; low normal EF at 50%   Solitary pulmonary nodule on lung CT 02/06/2019   First noted 01/13/2014 > no change as of 12/21/2018   Spondylolisthesis, lumbar region 08/09/2018   Stroke (HCC)    TIA in 2018 or 2019   Transient ischemic attack 07/30/2013   Type 2 diabetes mellitus treated with insulin  (HCC)    endocrilogist--- whitney reardon NP     (03-10-2021  pt continuously checks blood sugar throughout the day w/ Libre, fasting sugar --- 69--200)   Wears glasses    Wears hearing aid in both ears     Past Surgical History:  Procedure Laterality Date   ABDOMINAL HYSTERECTOMY     BLEPHAROPLASTY     CARDIAC CATHETERIZATION  02/18/2013   @ [facility]  by Dr Sw[place];  normal coronaries w/ preserved LVF, ef 50%;   previous cath 2001 normal ef 65%   CARPAL TUNNEL RELEASE Bilateral 1994   CATARACT EXTRACTION W/ INTRAOCULAR LENS IMPLANT Bilateral 2017    CHOLECYSTECTOMY     CHOLECYSTECTOMY, LAPAROSCOPIC  1993   COLONOSCOPY     EAR BIOPSY Left    FINGER SURGERY Left 2018   thumb   FOOT SURGERY     FOOT TENDON SURGERY Right    early 2000s   FRACTURE SURGERY Left 02/13/2020   left femur   HAND SURGERY Right 04/2022   HARDWARE REMOVAL Left 03/12/2021   Procedure: HARDWARE REMOVAL;  Surgeon: Sharl Selinda Dover, MD;  Location: [facility];  Service: Orthopedics;  Laterality: Left;  60 MINS   JOINT REPLACEMENT     LEFT HEART CATH AND CORONARY ANGIOGRAPHY N/A 12/07/2023   Procedure: LEFT HEART CATH AND CORONARY ANGIOGRAPHY;  Surgeon: Verlin Lonni BIRCH, MD;  Location: [facility] INVASIVE CV LAB;  Service: Cardiovascular;  Laterality: N/A;   LUMBAR LAMINECTOMY/DECOMPRESSION MICRODISCECTOMY N/A 12/22/2022  Procedure: Lumbar Two-Lumbar Three, Lumbar Three-Lumbar Four Lumbar Decompression;  Surgeon: Colon Shove, MD;  Location: [facility] OR;  Service: Neurosurgery;  Laterality: N/A;  RM 20 3C   ORIF ANKLE FRACTURE Left 02/26/2020   Procedure: OPEN REDUCTION INTERNAL FIXATION (ORIF) ANKLE FRACTURE;  Surgeon: Sharl Selinda Dover, MD;  Location: [facility] OR;  Service: Orthopedics;  Laterality: Left;  90 mins   TONSILLECTOMY AND ADENOIDECTOMY Bilateral    TOTAL KNEE ARTHROPLASTY Left 03/2005   TOTAL VAGINAL HYSTERECTOMY  1988   per pt still has ovaries    Current Medications: Current Meds  Medication Sig   acetaminophen  (TYLENOL ) 650 MG CR tablet Take 650-1,300 mg by mouth every 8 (eight) hours as needed for pain.   aspirin  EC (ASPIRIN  LOW DOSE) 81 MG tablet Take 1 tablet (81 mg total) by mouth daily. Swallow whole.   atorvastatin  (LIPITOR ) 80 MG tablet TAKE 1 TABLET(80 MG) BY MOUTH DAILY   blood glucose meter kit and supplies KIT Dispense based on patient and insurance preference. Use up to four times daily as directed.   carboxymethylcellulose (REFRESH PLUS) 0.5 % SOLN Place 1 drop into both eyes 3 (three) times daily as needed (dry eyes).    carvedilol  (COREG ) 12.5 MG tablet TAKE 1 TABLET(12.5 MG) BY MOUTH TWICE DAILY WITH A MEAL   Cholecalciferol (VITAMIN D3) 50 MCG (2000 UT) TABS Take 4,000 Units by mouth in the morning and at bedtime.   cyanocobalamin  (VITAMIN B12) 1000 MCG tablet Take 1,000 mcg by mouth daily with lunch.   Dulaglutide  (TRULICITY ) 0.75 MG/0.5ML SOAJ Inject 0.75 mg into the skin once a week.   ELIQUIS  5 MG TABS tablet TAKE 1 TABLET(5 MG) BY MOUTH TWICE DAILY   ferrous sulfate  325 (65 FE) MG tablet Take 325 mg by mouth daily with lunch. (Patient taking differently: Take 325 mg by mouth every other day.)   furosemide  (LASIX ) 20 MG tablet TAKE 1 TABLET(20 MG) BY MOUTH DAILY (Patient taking differently: Take 20 mg by mouth as needed.)   GEMTESA  75 MG TABS Take 1 tablet (75 mg total) by mouth at bedtime.   insulin  aspart (NOVOLOG  FLEXPEN) 100 UNIT/ML FlexPen Max daily 30 units   lisinopril  (ZESTRIL ) 20 MG tablet Take 20 mg by mouth daily. TAKE 1 TABLET IN THE MORNING AND 1/2 TABLET IN THE EVENING   magnesium  oxide (MAG-OX) 400 (240 Mg) MG tablet TAKE 2 TABLETS(800 MG) BY MOUTH TWICE DAILY   methenamine  (HIPREX ) 1 g tablet Take 1 tablet (1 g total) by mouth 2 (two) times daily with a meal.   olmesartan  (BENICAR ) 20 MG tablet Take 2 tablets (40 mg total) by mouth daily.   ondansetron  (ZOFRAN -ODT) 8 MG disintegrating tablet Take 8 mg by mouth every 8 (eight) hours as needed for nausea or vomiting.    pantoprazole  (PROTONIX ) 40 MG tablet TAKE 1 TABLET(40 MG) BY MOUTH EVERY MORNING   pyridoxine  (B-6) 100 MG tablet Take 100 mg by mouth daily with lunch.   sennosides-docusate sodium  (SENOKOT-S) 8.6-50 MG tablet Take 1 tablet by mouth every other day.   sodium chloride  (OCEAN) 0.65 % SOLN nasal spray Place 1 spray into both nostrils as needed for congestion.   zolpidem  (AMBIEN ) 5 MG tablet TAKE 1 TABLET(5 MG) BY MOUTH AT BEDTIME AS NEEDED FOR SLEEP     Allergies:   Loxapine succinate, Macrodantin  [nitrofurantoin   macrocrystal], Naproxen sodium, Telithromycin, Amoxapine and related, Darvon, Levothyroxine  sodium, Belsomra [suvorexant], Cymbalta [duloxetine hcl], Duloxetine, Gabapentin , Lyrica  [pregabalin ], Naproxen, Ozempic  (0.25 or 0.5 mg-dose) [semaglutide (0.25  or 0.5mg -dos)], Semaglutide , Amoxicillin , and Propoxyphene   Social History   Socioeconomic History   Marital status: Married    Spouse name: Fairy   Number of children: Not on file   Years of education: Not on file   Highest education level: Some college, no degree  Occupational History   Not on file  Tobacco Use   Smoking status: Never   Smokeless tobacco: Never  Vaping Use   Vaping status: Never Used  Substance and Sexual Activity   Alcohol use: No   Drug use: Never   Sexual activity: Not on file    Comment: Hysterectomy  Other Topics Concern   Not on file  Social History Narrative   Lives with spouse in [place].  2 grown daughters.   Retired Print production planner     Social Drivers of Corporate investment banker Strain: Low Risk  (03/29/2024)   Overall Financial Resource Strain (CARDIA)    Difficulty of Paying Living Expenses: Not very hard  Food Insecurity: No Food Insecurity (03/29/2024)   Hunger Vital Sign    Worried About Running Out of Food in the Last Year: Never true    Ran Out of Food in the Last Year: Never true  Transportation Needs: No Transportation Needs (03/29/2024)   PRAPARE - Administrator, Civil Service (Medical): No    Lack of Transportation (Non-Medical): No  Physical Activity: Inactive (03/29/2024)   Exercise Vital Sign    Days of Exercise per Week: 0 days    Minutes of Exercise per Session: 0 min  Stress: No Stress Concern Present (03/29/2024)   [employer] of Occupational Health - Occupational Stress Questionnaire    Feeling of Stress: Only a little  Social Connections: Socially Integrated (03/29/2024)   Social Connection and Isolation Panel    Frequency of Communication with Friends  and Family: More than three times a week    Frequency of Social Gatherings with Friends and Family: Once a week    Attends Religious Services: More than 4 times per year    Active Member of [employer] or Organizations: Yes    Attends Engineer, structural: More than 4 times per year    Marital Status: Married     Family History: The patient's family history includes Antithrombin III deficiency in her mother; Cirrhosis in her mother; Coronary artery disease in her father; Diabetes in her father and sister; Heart attack in her brother, father, and sister; Hypertension in her brother, father, and sister; Kidney disease in her father; Lung cancer in her brother; Protein S deficiency in her daughter; Ulcerative colitis in her mother. There is no history of Stroke, Colitis, Colon polyps, Esophageal cancer, Liver cancer, Pancreatic cancer, Rectal cancer, Stomach cancer, or Breast cancer.  ROS:   Please see the history of present illness.     All other systems reviewed and are negative.  EKGs/Labs/Other Studies Reviewed:         Recent Labs: 01/23/2024: TSH 2.012 03/19/2024: BNP 331.4; Hemoglobin 11.8; Platelets 251 03/28/2024: ALT 25; BUN 28; Creatinine, Ser 0.92; Magnesium  1.8; NT-Pro BNP 930; Potassium 4.7; Sodium 138   Recent Lipid Panel    Component Value Date/Time   CHOL 125 01/31/2024 0827   TRIG 41 01/31/2024 0827   HDL 61 01/31/2024 0827   CHOLHDL 2.0 01/31/2024 0827   LDLCALC 54 01/31/2024 0827    Physical Exam:   VS:  BP (!) 180/52 (BP Location: Left Arm, Patient Position: Sitting, Cuff Size: Normal)  Pulse (!) 55   Ht 5' (1.524 m)   Wt 149 lb (67.6 kg)   SpO2 99%   BMI 29.10 kg/m  , BMI Body mass index is 29.1 kg/m.  Vitals:   04/08/24 1430  BP: (!) 180/52  Pulse: (!) 55  Height: 5' (1.524 m)  Weight: 149 lb (67.6 kg)  SpO2: 99%  BMI (Calculated): 29.1    GENERAL:  Well appearing HEENT: Pupils equal round and reactive, fundi not visualized, oral mucosa  unremarkable NECK:  No jugular venous distention, waveform within normal limits, carotid upstroke brisk and symmetric, no bruits, no thyromegaly LYMPHATICS:  No cervical adenopathy LUNGS:  Clear to auscultation bilaterally HEART:  RRR.  PMI not displaced or sustained,S1 and S2 within normal limits, no S3, no S4, no clicks, no rubs, no murmurs ABD:  Flat, positive bowel sounds normal in frequency in pitch, no bruits, no rebound, no guarding, no midline pulsatile mass, no hepatomegaly, no splenomegaly EXT:  2 plus pulses throughout, no edema, no cyanosis no clubbing SKIN:  No rashes no nodules NEURO:  Cranial nerves II through XII grossly intact, motor grossly intact throughout PSYCH:  Cognitively intact, oriented to person place and time   ASSESSMENT/PLAN:     Assessment & Plan Hypertension She misunderstood and has been taking both Lisinopril  and Olmesartan . STOP Lisinopril . Continue Olmesartan  40mg  daily. Continue Carvedilol  12.5mg  BID. Start Amlodipine  5mg  at bedtime.  -History of near-syncope (cardiac workup unremarkable) and orthostatic hypotension complicate management of hypertension. 24-hour blood pressure monitor previously ordered, not yet performed. - Renin aldosterone ratio 03/2024 unremarkable -phone call in 1 week to check in  HFpEF BMP and proBNP today to reassess volume status.  Continue Lasix  20 mg as needed for fluid retention.  DM2 Continue to follow with PCP.   PAF / PVC / Anticoagulation therapy On Eliquis  5mg  BID per primary cardiologist. Denies bleeding complications.      Screening for Secondary Hypertension:     Relevant Labs/Studies:    Latest Ref Rng & Units 03/28/2024   10:47 AM 03/19/2024   10:57 AM 02/12/2024   12:24 PM  Basic Labs  Sodium 134 - 144 mmol/L 138  133  133   Potassium 3.5 - 5.2 mmol/L 4.7  5.5  4.9   Creatinine 0.57 - 1.00 mg/dL 9.07  9.00  9.03        Latest Ref Rng & Units 01/23/2024    5:02 AM 09/27/2023    2:21 PM  Thyroid     TSH 0.350 - 4.500 uIU/mL 2.012  1.97        Latest Ref Rng & Units 03/28/2024   10:47 AM  Renin/Aldosterone   Aldosterone 0.0 - 30.0 ng/dL 4.7   Aldos/Renin Ratio 0.0 - 30.0 11.2           Latest Ref Rng & Units 01/23/2024    1:38 PM 03/10/2020    4:46 PM 03/10/2020    3:52 PM  Cortisol  Cortisol  ug/dL 6.4  74.9  88.2          Disposition:    FU with MD/APP/PharmD in 1 months    Medication Adjustments/Labs and Tests Ordered: Current medicines are reviewed at length with the patient today.  Concerns regarding medicines are outlined above.  No orders of the defined types were placed in this encounter.  No orders of the defined types were placed in this encounter.    Signed, Reche GORMAN Finder, NP  04/08/2024 2:44 PM    [facility]

## 2024-04-08 NOTE — Patient Instructions (Addendum)
 Medication Instructions:  STOP Lisinopril    CONTINUE Olmesartan  40mg  daily in the morning  CONTINUE Carvedilol  (Coreg ) twice per day  START Amlodipine  5mg  every evening  Labwork: Your physician recommends that you return for lab work today: BMET, ProBNP   Follow-Up: We will call in one week to check in on BP In office visit as scheduled in September

## 2024-04-09 LAB — BASIC METABOLIC PANEL WITH GFR
BUN/Creatinine Ratio: 36 — ABNORMAL HIGH (ref 12–28)
BUN: 36 mg/dL — ABNORMAL HIGH (ref 8–27)
CO2: 23 mmol/L (ref 20–29)
Calcium: 9.4 mg/dL (ref 8.7–10.3)
Chloride: 95 mmol/L — ABNORMAL LOW (ref 96–106)
Creatinine, Ser: 0.99 mg/dL (ref 0.57–1.00)
Glucose: 199 mg/dL — ABNORMAL HIGH (ref 70–99)
Potassium: 4.6 mmol/L (ref 3.5–5.2)
Sodium: 131 mmol/L — ABNORMAL LOW (ref 134–144)
eGFR: 58 mL/min/1.73 — ABNORMAL LOW (ref >59)

## 2024-04-09 LAB — PRO B NATRIURETIC PEPTIDE: NT-Pro BNP: 1501 pg/mL — ABNORMAL HIGH (ref 0–738)

## 2024-04-10 ENCOUNTER — Ambulatory Visit (HOSPITAL_BASED_OUTPATIENT_CLINIC_OR_DEPARTMENT_OTHER): Payer: Self-pay | Admitting: Family

## 2024-04-10 DIAGNOSIS — Z79899 Other long term (current) drug therapy: Secondary | ICD-10-CM

## 2024-04-11 ENCOUNTER — Ambulatory Visit: Attending: Family

## 2024-04-11 DIAGNOSIS — I951 Orthostatic hypotension: Secondary | ICD-10-CM

## 2024-04-11 DIAGNOSIS — R0989 Other specified symptoms and signs involving the circulatory and respiratory systems: Secondary | ICD-10-CM

## 2024-04-11 DIAGNOSIS — Z87898 Personal history of other specified conditions: Secondary | ICD-10-CM

## 2024-04-11 NOTE — Progress Notes (Unsigned)
24 hour ambulatory blood pressure monitor applied to patients right arm using standard adult cuff. 

## 2024-04-15 ENCOUNTER — Other Ambulatory Visit: Payer: Self-pay | Admitting: Family Medicine

## 2024-04-15 ENCOUNTER — Other Ambulatory Visit: Payer: Self-pay

## 2024-04-15 DIAGNOSIS — R899 Unspecified abnormal finding in specimens from other organs, systems and tissues: Secondary | ICD-10-CM

## 2024-04-15 DIAGNOSIS — Z79899 Other long term (current) drug therapy: Secondary | ICD-10-CM

## 2024-04-15 DIAGNOSIS — I130 Hypertensive heart and chronic kidney disease with heart failure and stage 1 through stage 4 chronic kidney disease, or unspecified chronic kidney disease: Secondary | ICD-10-CM

## 2024-04-15 MED ORDER — OLMESARTAN MEDOXOMIL 20 MG PO TABS: 40.0000 mg | ORAL_TABLET | Freq: Every day | ORAL | Status: DC

## 2024-04-15 NOTE — Telephone Encounter (Signed)
 Copied from CRM [id number]. Topic: Clinical - Medication Refill >> Apr 15, 2024 11:12 AM Essie A wrote: Medication: olmesartan  (BENICAR ) 20 MG tablet (WOULD LIKE A 90-DAY SUPPLY)  Has the patient contacted their pharmacy? No because the dosage has changed from 1 tablet to 2 tablets daily (Agent: If no, request that the patient contact the pharmacy for the refill. If patient does not wish to contact the pharmacy document the reason why and proceed with request.) (Agent: If yes, when and what did the pharmacy advise?)  This is the patient's preferred pharmacy:  [id number] [place], [place] - [address] AT [facility] [address] [place] [address] Phone: [phone] Fax: [phone]  Is this the correct pharmacy for this prescription? Yes If no, delete pharmacy and type the correct one.   Has the prescription been filled recently? No  Is the patient out of the medication? No, have 3 more days left  Has the patient been seen for an appointment in the last year OR does the patient have an upcoming appointment? Yes  Can we respond through MyChart? Yes  Agent: Please be advised that Rx refills may take up to 3 business days. We ask that you follow-up with your pharmacy.

## 2024-04-16 LAB — MAGNESIUM: Magnesium: 1.8 mg/dL (ref 1.6–2.3)

## 2024-04-16 LAB — COMPREHENSIVE METABOLIC PANEL WITH GFR
ALT: 26 IU/L (ref 0–32)
AST: 28 IU/L (ref 0–40)
Albumin: 3.7 g/dL — ABNORMAL LOW (ref 3.8–4.8)
Alkaline Phosphatase: 117 IU/L (ref 44–121)
BUN/Creatinine Ratio: 38 — ABNORMAL HIGH (ref 12–28)
BUN: 37 mg/dL — ABNORMAL HIGH (ref 8–27)
Bilirubin Total: 0.3 mg/dL (ref 0.0–1.2)
CO2: 27 mmol/L (ref 20–29)
Calcium: 9 mg/dL (ref 8.7–10.3)
Chloride: 100 mmol/L (ref 96–106)
Creatinine, Ser: 0.98 mg/dL (ref 0.57–1.00)
Globulin, Total: 2.3 g/dL (ref 1.5–4.5)
Glucose: 146 mg/dL — ABNORMAL HIGH (ref 70–99)
Potassium: 4.4 mmol/L (ref 3.5–5.2)
Sodium: 138 mmol/L (ref 134–144)
Total Protein: 6 g/dL (ref 6.0–8.5)
eGFR: 59 mL/min/1.73 — ABNORMAL LOW (ref >59)

## 2024-04-16 LAB — PRO B NATRIURETIC PEPTIDE: NT-Pro BNP: 1046 pg/mL — ABNORMAL HIGH (ref 0–738)

## 2024-04-17 ENCOUNTER — Other Ambulatory Visit: Payer: Self-pay | Admitting: Family Medicine

## 2024-04-18 ENCOUNTER — Telehealth (HOSPITAL_BASED_OUTPATIENT_CLINIC_OR_DEPARTMENT_OTHER): Payer: Self-pay | Admitting: Family

## 2024-04-18 DIAGNOSIS — I10 Essential (primary) hypertension: Secondary | ICD-10-CM

## 2024-04-18 DIAGNOSIS — I130 Hypertensive heart and chronic kidney disease with heart failure and stage 1 through stage 4 chronic kidney disease, or unspecified chronic kidney disease: Secondary | ICD-10-CM

## 2024-04-18 DIAGNOSIS — I5021 Acute systolic (congestive) heart failure: Secondary | ICD-10-CM

## 2024-04-18 MED ORDER — FUROSEMIDE 20 MG PO TABS: 20.0000 mg | ORAL_TABLET | Freq: Every day | ORAL | 0 refills | Status: DC

## 2024-04-18 MED ORDER — AMLODIPINE BESYLATE 10 MG PO TABS: 10.0000 mg | ORAL_TABLET | Freq: Every day | ORAL | 1 refills | Status: DC

## 2024-04-18 MED ORDER — OLMESARTAN MEDOXOMIL 40 MG PO TABS: 40.0000 mg | ORAL_TABLET | Freq: Every day | ORAL | 1 refills | Status: DC

## 2024-04-18 NOTE — Telephone Encounter (Signed)
 Called Miss Rochford to check in on BP. Reports her blood pressure is like a yo yo. She reports headache which did improve some with Tylenol  yesterday but worsened when she was up doing things.  Home BP readings: BP 10 P 180/94 (60), 10A 165/90 (57), 129/66, 1:25 PM Yesterday 146/73 (64), 164/70 (62), Last night 5P 146/66 (57)  24h BP monitor 04/11/24 with overall average BP 151/63, when awake 156/66 and asleep 129/49.  Reports labile blood sugar particularly with activity noting hypoglycemic episodes. No recent changes in insulin  regimen. As low as 49 yesterday which made her feel diaphoretic and poorly after she mopped her home. Reports no missed meals. Encouraged to discuss with Dr. Sam.  Brief HPI 04/08/24 continue olmesartan  40mg  daily, continue coreg  BID, start Amlodipine  5mg  QHS 04/10/2024 due to elevated proBNP Lasix  20 mg daily initiated. 04/15/24 labs normal magnesium , potassium, liver, stable kidney function.  Sodium had normalized from prior. Pro BNP BNP 1501 ? 1046.   Changes today discussed via phone: Reviewed labs 04/15/24 Reviewed 24h BP monitor Continue Olmesartan  40mg  daily, updated Rx sent Change amlodipine  to 10mg  daily, updated Rx sent Continue Lasix  20mg  daily   Reche GORMAN Finder, NP

## 2024-04-23 DIAGNOSIS — N183 Chronic kidney disease, stage 3 unspecified: Secondary | ICD-10-CM | POA: Diagnosis not present

## 2024-04-24 ENCOUNTER — Other Ambulatory Visit (HOSPITAL_COMMUNITY): Payer: Self-pay

## 2024-04-26 NOTE — Telephone Encounter (Signed)
 Called today to check in on BP. Reports BP at home 150-160s/70-80s. No significant hypotension or hypertensive readings. Blood sugar continues to be labile.   Per her report spoke with Dr. Sheena the other day and will touch base again Monday.   Politely declines medication changes at this time.   Discussed possible re-initation of Imdur  30mg  at bedtime or Hydralazine  25mg  BID. Could be considered in future.  Baily Serpe S Gaither Biehn, NP

## 2024-04-28 ENCOUNTER — Other Ambulatory Visit: Payer: Self-pay | Admitting: Family Medicine

## 2024-04-28 DIAGNOSIS — K219 Gastro-esophageal reflux disease without esophagitis: Secondary | ICD-10-CM

## 2024-04-30 ENCOUNTER — Other Ambulatory Visit: Payer: Self-pay

## 2024-04-30 DIAGNOSIS — I130 Hypertensive heart and chronic kidney disease with heart failure and stage 1 through stage 4 chronic kidney disease, or unspecified chronic kidney disease: Secondary | ICD-10-CM

## 2024-04-30 MED ORDER — OLMESARTAN MEDOXOMIL 40 MG PO TABS: 40.0000 mg | ORAL_TABLET | Freq: Every day | ORAL | 1 refills | Status: AC

## 2024-05-02 ENCOUNTER — Ambulatory Visit: Payer: Self-pay | Admitting: Cardiology

## 2024-05-02 DIAGNOSIS — M25375 Other instability, left foot: Secondary | ICD-10-CM | POA: Diagnosis not present

## 2024-05-02 DIAGNOSIS — M2042 Other hammer toe(s) (acquired), left foot: Secondary | ICD-10-CM | POA: Diagnosis not present

## 2024-05-02 DIAGNOSIS — H353132 Nonexudative age-related macular degeneration, bilateral, intermediate dry stage: Secondary | ICD-10-CM | POA: Diagnosis not present

## 2024-05-03 ENCOUNTER — Ambulatory Visit
Admission: RE | Admit: 2024-05-03 | Discharge: 2024-05-03 | Disposition: A | Discharge status: Home / Self Care | Source: Ambulatory Visit | Attending: Family Medicine | Admitting: Family Medicine

## 2024-05-03 ENCOUNTER — Other Ambulatory Visit: Payer: Self-pay

## 2024-05-03 DIAGNOSIS — I499 Cardiac arrhythmia, unspecified: Secondary | ICD-10-CM | POA: Insufficient documentation

## 2024-05-03 DIAGNOSIS — Z974 Presence of external hearing-aid: Secondary | ICD-10-CM | POA: Insufficient documentation

## 2024-05-03 DIAGNOSIS — Z87898 Personal history of other specified conditions: Secondary | ICD-10-CM | POA: Insufficient documentation

## 2024-05-03 DIAGNOSIS — D759 Disease of blood and blood-forming organs, unspecified: Secondary | ICD-10-CM | POA: Insufficient documentation

## 2024-05-03 DIAGNOSIS — D509 Iron deficiency anemia, unspecified: Secondary | ICD-10-CM | POA: Insufficient documentation

## 2024-05-03 DIAGNOSIS — J4520 Mild intermittent asthma, uncomplicated: Secondary | ICD-10-CM | POA: Insufficient documentation

## 2024-05-03 DIAGNOSIS — Z9889 Other specified postprocedural states: Secondary | ICD-10-CM | POA: Insufficient documentation

## 2024-05-03 DIAGNOSIS — Z1231 Encounter for screening mammogram for malignant neoplasm of breast: Secondary | ICD-10-CM | POA: Diagnosis not present

## 2024-05-03 DIAGNOSIS — Z973 Presence of spectacles and contact lenses: Secondary | ICD-10-CM | POA: Insufficient documentation

## 2024-05-03 DIAGNOSIS — M51379 Other intervertebral disc degeneration, lumbosacral region without mention of lumbar back pain or lower extremity pain: Secondary | ICD-10-CM | POA: Insufficient documentation

## 2024-05-03 DIAGNOSIS — R768 Other specified abnormal immunological findings in serum: Secondary | ICD-10-CM

## 2024-05-03 DIAGNOSIS — Z8679 Personal history of other diseases of the circulatory system: Secondary | ICD-10-CM | POA: Insufficient documentation

## 2024-05-03 DIAGNOSIS — N3946 Mixed incontinence: Secondary | ICD-10-CM | POA: Insufficient documentation

## 2024-05-03 HISTORY — DX: Other specified abnormal immunological findings in serum: R76.8

## 2024-05-07 ENCOUNTER — Ambulatory Visit: Admitting: Cardiology

## 2024-05-07 DIAGNOSIS — N390 Urinary tract infection, site not specified: Secondary | ICD-10-CM | POA: Diagnosis not present

## 2024-05-07 DIAGNOSIS — E118 Type 2 diabetes mellitus with unspecified complications: Secondary | ICD-10-CM | POA: Diagnosis not present

## 2024-05-07 DIAGNOSIS — N1832 Chronic kidney disease, stage 3b: Secondary | ICD-10-CM | POA: Diagnosis not present

## 2024-05-07 DIAGNOSIS — E875 Hyperkalemia: Secondary | ICD-10-CM | POA: Diagnosis not present

## 2024-05-07 DIAGNOSIS — I10 Essential (primary) hypertension: Secondary | ICD-10-CM | POA: Diagnosis not present

## 2024-05-08 ENCOUNTER — Ambulatory Visit: Admitting: Urology

## 2024-05-08 ENCOUNTER — Encounter: Payer: Self-pay | Admitting: Urology

## 2024-05-08 VITALS — BP 137/71 | HR 33 | Ht 60.0 in | Wt 144.0 lb

## 2024-05-08 DIAGNOSIS — Q6479 Other congenital malformations of bladder and urethra: Secondary | ICD-10-CM

## 2024-05-08 DIAGNOSIS — N3020 Other chronic cystitis without hematuria: Secondary | ICD-10-CM | POA: Diagnosis not present

## 2024-05-08 DIAGNOSIS — N952 Postmenopausal atrophic vaginitis: Secondary | ICD-10-CM | POA: Diagnosis not present

## 2024-05-08 DIAGNOSIS — R339 Retention of urine, unspecified: Secondary | ICD-10-CM

## 2024-05-08 DIAGNOSIS — Z87440 Personal history of urinary (tract) infections: Secondary | ICD-10-CM

## 2024-05-08 LAB — URINALYSIS, ROUTINE W REFLEX MICROSCOPIC
Bilirubin, UA: NEGATIVE
Glucose, UA: NEGATIVE
Ketones, UA: NEGATIVE
Nitrite, UA: NEGATIVE
Protein,UA: NEGATIVE
Specific Gravity, UA: 1.01 (ref 1.005–1.030)
Urobilinogen, Ur: 0.2 mg/dL (ref 0.2–1.0)
pH, UA: 5.5 (ref 5.0–7.5)

## 2024-05-08 LAB — BLADDER SCAN AMB NON-IMAGING: Scan Result: 120

## 2024-05-08 LAB — MICROSCOPIC EXAMINATION

## 2024-05-08 NOTE — Progress Notes (Signed)
 Assessment: 1.  Recurring urinary tract infections in 78 year old female.  On methenamine  suppression.  She has not experienced symptoms of UTI since her first visit  2.  Probable vaginal atrophic changes, patient not candidate for estrogen therapy in perivaginal area due to protein S disease  3.  Mild incomplete bladder emptying.  Stable residual urine volume today.   Plan: 1.  Change methenamine  to 1 tablet at night for 3 months, then DC  2.  I will see her back in 4 months for check  History of Present Illness:  7.2.2025: Initial visit for evaluation of frequent urinary tract infections.  Over the past year or 2, it seems like she has infections almost every month.  Typical symptoms include frequency, urgency, dysuria.  She will also have hematuria.  She does not have febrile UTIs.    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  She was told years ago not to take estrogen replacement therapy.  She does not always have a good urinary stream.  She does have urgency and stress urinary incontinence.  She has seen a urologist in [place] in the past.  9.3.2025: Here today for recheck.  She has been using methenamine .  Which she knows of, she has not had any urinary infections.  Her urine does smell strong but she has had no dysuria.  Methenamine  is well-tolerated.   Past Medical History:  Past Medical History:  Diagnosis Date   Abnormal CT of the chest 07/20/2023   Acquired thrombophilia (HCC) 06/27/2021   Acute combined systolic and diastolic heart failure (HCC) 12/08/2023   Acute cystitis without hematuria 06/12/2022   Acute heart failure with mildly reduced ejection fraction (HFmrEF, 41-49%) (HCC) 12/07/2023   Acute LUQ pain 02/12/2024   Acute on chronic diastolic (congestive) heart failure (HCC) 12/07/2023   Anemia in chronic kidney disease 09/06/2023   Anticoagulated on Coumadin     chronic--- managed by hematology/ oncology,   Asthma    Asthma, mild  intermittent    followed by pcp--- per pt last exacerbation winter 2021 w/ acute bronchitis   Athscl heart disease of native coronary artery w/o ang pctrs 09/06/2023   Atrial fibrillation [I48.91] 10/01/2014   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, NYHA class 1 (HCC) 05/23/2011   Bilateral leg cramps    Blood dyscrasia    Bunion 08/23/2021   Cervical radiculopathy 07/19/2021   Cervical spondylosis 09/06/2023   Chest pain at rest 01/22/2024   Chronic constipation    Chronic diastolic congestive heart failure (HCC) 12/09/2020   Chronic pain of left ankle 03/31/2024   CKD (chronic kidney disease), stage III (HCC)    CKD stage 3a, GFR 45-59 ml/min (HCC) 11/05/2023   Closed bimalleolar fracture of left ankle 02/26/2020   Closed fracture of fifth metatarsal bone of left foot 12/06/2021   Closed fracture of fourth metatarsal bone 05/07/2021   Compression fracture of L1 lumbar vertebra (HCC) 12/17/2022   DDD (degenerative disc disease), cervical    w/ spondylosis,  per pt last steroid injection 06/ 2022   DDD (degenerative disc disease), lumbosacral    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   DJD (degenerative joint disease)    DVT (deep venous thrombosis) (HCC) 07/30/2013   Dyspnea    occasionally   Dyspnea on exertion 03/03/2013   Dysrhythmia    Dysuria 04/17/2023   Elevated rheumatoid factor 05/03/2024   Encntr for surgical aftcr following surgery on the circ sys 09/06/2023   Encounter  for immunization 07/20/2023   Encounter for Medicare annual wellness exam 04/04/2024   Encounter for orthopedic follow-up care 03/10/2020   Flank pain 02/12/2024   GERD (gastroesophageal reflux disease)    Hammer toe 08/23/2021   Heart murmur    as a child     History of cardiac murmur as a child    History of DVT of lower extremity    left lower extremity in 1980s, fell when bowling   History of falling 09/06/2023   History of pulmonary embolus (PE) 1993   per pt left lung post  op 2 wks cholecystectomy   History of rheumatic fever as a child    per last echo 12-31-2019 no valvular issues   History of TIA (transient ischemic attack)    2014 and 2018 or 2019,  per pt no residual   HTN (hypertension)    followed by pcp   Hyperkalemia 03/31/2024   Hyperlipidemia    Hypertensive heart and chronic kidney disease with heart failure and stage 1 through stage 4 chronic kidney disease, or unspecified chronic kidney disease (HCC) 12/12/2023   Hyponatremia 02/06/2019   Na 126 on no obvious meds to cause it with last na 133 in 2014      Hypothyroidism    IDA (iron deficiency anemia)    Joint pain 10/24/2021   Low back strain 08/09/2018   Lumbar radiculopathy 08/13/2021   Macular degeneration of both eyes    Mild obstructive sleep apnea    per pt dx 2017 tried to uses cpap but intolerant   Mixed incontinence urge and stress    urologist--- dr marda   Muscle pain 10/24/2021   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   OA (osteoarthritis) 07/06/2018   Osteoporosis    Other cardiomyopathies (HCC) 09/06/2023   Other instability, left foot 03/28/2024   Other microscopic hematuria 03/31/2024   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   PAF (paroxysmal atrial fibrillation) (HCC) 10/01/2014   cardiologist--- dr dub tobb;   cardiac cath 02-18-2013 normal coronaries arteries, ef 50%, cath done since echo showed ef 30-35%; nucleat stress study 04/ 2020 normal , normal echo 04/ 2021,  event monitor 09-14-2020 rare ST/AT variable block   Pain in left foot 12/17/2021   Pain of right hip joint 08/11/2022   Personal history of pneumonia (recurrent) 09/06/2023   Personal history of urinary (tract) infections 09/06/2023   Pneumonia    PONV (postoperative nausea and vomiting)    Presence of left artificial knee joint 09/06/2023   Protein S deficiency (HCC)    followed by hemotology/ oncology-- dr d. 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/22/2022   Spondylolisthesis, lumbar region 08/09/2018   Sprain of right ankle 10/13/2023   Stage 3b chronic kidney disease (HCC) 12/17/2022   Stroke (HCC)    TIA in 2018 or 2019   Syncope 01/22/2024   Transient ischemic attack 07/30/2013  Traumatic arthritis of left ankle 02/12/2024   Type 2 diabetes mellitus treated with insulin  (HCC)    endocrilogist--- whitney reardon NP     (03-10-2021  pt continuously checks blood sugar throughout the day w/ Libre, fasting sugar --- 69--200)   Type 2 diabetes mellitus with diabetic neuropathy, with long-term current use of insulin  (HCC) 06/15/2021   Unilateral primary osteoarthritis, right knee 07/06/2018   Urinary frequency 02/12/2024   UTI (urinary tract infection) 01/23/2024   UTI (urinary tract infection) due to Enterococcus 01/22/2024   Ventricular premature depolarization 09/06/2023   Wears glasses    Wears hearing aid in both ears     Past Surgical History:  Past Surgical History:  Procedure Laterality Date   ABDOMINAL HYSTERECTOMY     BLEPHAROPLASTY     CARDIAC CATHETERIZATION  02/18/2013   @ [facility]  by Dr Sw[place];  normal coronaries w/ preserved LVF, ef 50%;   previous cath 2001 normal ef 65%   CARPAL TUNNEL RELEASE Bilateral 1994   CATARACT EXTRACTION W/ INTRAOCULAR LENS IMPLANT Bilateral 2017   CHOLECYSTECTOMY     CHOLECYSTECTOMY, LAPAROSCOPIC  1993   COLONOSCOPY     EAR  BIOPSY Left    FINGER SURGERY Left 2018   thumb   FOOT SURGERY     FOOT TENDON SURGERY Right    early 2000s   FRACTURE SURGERY Left 02/13/2020   left femur   HAND SURGERY Right 04/2022   HARDWARE REMOVAL Left 03/12/2021   Procedure: HARDWARE REMOVAL;  Surgeon: Sharl Selinda Dover, MD;  Location: [facility];  Service: Orthopedics;  Laterality: Left;  60 MINS   JOINT REPLACEMENT     LEFT HEART CATH AND CORONARY ANGIOGRAPHY N/A 12/07/2023   Procedure: LEFT HEART CATH AND CORONARY ANGIOGRAPHY;  Surgeon: Verlin Lonni BIRCH, MD;  Location: [facility];  Service: Cardiovascular;  Laterality: N/A;   LUMBAR LAMINECTOMY/DECOMPRESSION MICRODISCECTOMY N/A 12/22/2022   Procedure: Lumbar Two-Lumbar Three, Lumbar Three-Lumbar Four Lumbar Decompression;  Surgeon: Colon Shove, MD;  Location: [facility] OR;  Service: Neurosurgery;  Laterality: N/A;  RM 20 3C   ORIF ANKLE FRACTURE Left 02/26/2020   Procedure: OPEN REDUCTION INTERNAL FIXATION (ORIF) ANKLE FRACTURE;  Surgeon: Sharl Selinda Dover, MD;  Location: [facility];  Service: Orthopedics;  Laterality: Left;  90 mins   TONSILLECTOMY AND ADENOIDECTOMY Bilateral    TOTAL KNEE ARTHROPLASTY Left 03/2005   TOTAL VAGINAL HYSTERECTOMY  1988   per pt still has ovaries    Allergies:  Allergies  Allergen Reactions   Loxapine Succinate Hives   Macrodantin  [Nitrofurantoin  Macrocrystal] Other (See Comments)    Pulmonary fibrosis   Naproxen Sodium Anaphylaxis and Hives   Telithromycin Nausea And Vomiting and Rash    Other Reaction(s): Unknown   Amoxapine And Related Hives   Darvon Nausea And Vomiting   Levothyroxine  Sodium Photosensitivity and Other (See Comments)    Blurry and double vision   Belsomra [Suvorexant] Other (See Comments)    Nightmares   Cymbalta [Duloxetine Hcl] Other (See Comments)    Blackouts   Duloxetine    Gabapentin  Other (See Comments)    Blurry vision   Lyrica  [Pregabalin ]     Makes her sedated    Naproxen Hives    Other Reaction(s): hives and GI upset   Ozempic  (0.25 Or 0.5 Mg-Dose) [Semaglutide (0.25 Or 0.5mg -Dos)] Diarrhea   Semaglutide  Diarrhea    Rybelsus    Amoxicillin  Rash    Other Reaction(s): hives and GI upset   Propoxyphene Nausea And Vomiting  Other Reaction(s): hives and GI upset    Family History:  Family History  Problem Relation Age of Onset   Cirrhosis Mother    Antithrombin III deficiency Mother        multiple emboli   Ulcerative colitis Mother    Diabetes Father    Coronary artery disease Father    Heart attack Father    Hypertension Father    Kidney disease Father    Hypertension Sister    Diabetes Sister    Heart attack Sister        age 49    Protein S deficiency Daughter    Heart attack Brother    Hypertension Brother    Lung cancer Brother    Stroke Neg Hx    Colitis Neg Hx    Colon polyps Neg Hx    Esophageal cancer Neg Hx    Liver cancer Neg Hx    Pancreatic cancer Neg Hx    Rectal cancer Neg Hx    Stomach cancer Neg Hx    Breast cancer Neg Hx     Social History:  Social History   Tobacco Use   Smoking status: Never   Smokeless tobacco: Never  Vaping Use   Vaping status: Never Used  Substance Use Topics   Alcohol use: No   Drug use: Never    Review of symptoms:  Constitutional:  Negative for unexplained weight loss, night sweats, fever, chills ENT:  Negative for nose bleeds, sinus pain, painful swallowing CV:  Negative for chest pain, shortness of breath, exercise intolerance, palpitations, loss of consciousness Resp:  Negative for cough, wheezing, shortness of breath GI:  Negative for nausea, vomiting, diarrhea, bloody stools GU:  Positives noted in HPI; otherwise negative for gross hematuria, dysuria, urinary incontinence Neuro:  Negative for seizures, poor balance, limb weakness, slurred speech Psych:  Negative for lack of energy, depression, anxiety Endocrine:  Negative for polydipsia, polyuria, symptoms of  hypoglycemia (dizziness, hunger, sweating) Hematologic:  Negative for anemia, purpura, petechia, prolonged or excessive bleeding, use of anticoagulants  Allergic:  Negative for difficulty breathing or choking as a result of exposure to anything; no shellfish allergy; no allergic response (rash/itch) to materials, foods

## 2024-05-09 ENCOUNTER — Ambulatory Visit: Payer: Self-pay | Admitting: Family Medicine

## 2024-05-09 ENCOUNTER — Other Ambulatory Visit: Payer: Self-pay

## 2024-05-10 ENCOUNTER — Ambulatory Visit (INDEPENDENT_AMBULATORY_CARE_PROVIDER_SITE_OTHER)

## 2024-05-10 ENCOUNTER — Ambulatory Visit: Attending: Cardiology | Admitting: Cardiology

## 2024-05-10 ENCOUNTER — Encounter: Payer: Self-pay | Admitting: Cardiology

## 2024-05-10 VITALS — BP 150/62 | HR 105 | Ht 61.0 in | Wt 146.4 lb

## 2024-05-10 DIAGNOSIS — I10 Essential (primary) hypertension: Secondary | ICD-10-CM | POA: Diagnosis not present

## 2024-05-10 DIAGNOSIS — I110 Hypertensive heart disease with heart failure: Secondary | ICD-10-CM

## 2024-05-10 DIAGNOSIS — I5030 Unspecified diastolic (congestive) heart failure: Secondary | ICD-10-CM | POA: Diagnosis not present

## 2024-05-10 DIAGNOSIS — I480 Paroxysmal atrial fibrillation: Secondary | ICD-10-CM

## 2024-05-10 DIAGNOSIS — I493 Ventricular premature depolarization: Secondary | ICD-10-CM

## 2024-05-10 DIAGNOSIS — R002 Palpitations: Secondary | ICD-10-CM | POA: Diagnosis not present

## 2024-05-10 DIAGNOSIS — I5021 Acute systolic (congestive) heart failure: Secondary | ICD-10-CM | POA: Diagnosis not present

## 2024-05-10 DIAGNOSIS — R55 Syncope and collapse: Secondary | ICD-10-CM | POA: Diagnosis not present

## 2024-05-10 NOTE — Patient Instructions (Addendum)
 Medication Instructions:  Your physician recommends that you continue on your current medications as directed. Please refer to the Current Medication list given to you today.  *If you need a refill on your cardiac medications before your next appointment, please call your pharmacy*   Lab Work: None Ordered If you have labs (blood work) drawn today and your tests are completely normal, you will receive your results only by: MyChart Message (if you have MyChart) OR A paper copy in the mail If you have any lab test that is abnormal or we need to change your treatment, we will call you to review the results.   Testing/Procedures:  WHY IS MY DOCTOR PRESCRIBING ZIO? The Zio system is proven and trusted by physicians to detect and diagnose irregular heart rhythms -- and has been prescribed to hundreds of thousands of patients.  The FDA has cleared the Zio system to monitor for many different kinds of irregular heart rhythms. In a study, physicians were able to reach a diagnosis 90% of the time with the Zio system1.  You can wear the Zio monitor -- a small, discreet, comfortable patch -- during your normal day-to-day activity, including while you sleep, shower, and exercise, while it records every single heartbeat for analysis.  1Barrett, P., et al. Comparison of 24 Hour Holter Monitoring Versus 14 Day Novel Adhesive Patch Electrocardiographic Monitoring. American Journal of Medicine, 2014.  ZIO VS. HOLTER MONITORING The Zio monitor can be comfortably worn for up to 14 days. Holter monitors can be worn for 24 to 48 hours, limiting the time to record any irregular heart rhythms you may have. Zio is able to capture data for the 51% of patients who have their first symptom-triggered arrhythmia after 48 hours.1  LIVE WITHOUT RESTRICTIONS The Zio ambulatory cardiac monitor is a small, unobtrusive, and water-resistant patch--you might even forget you're wearing it. The Zio monitor records and stores  every beat of your heart, whether you're sleeping, working out, or showering.     Follow-Up: At [facility], you and your health needs are our priority.  As part of our continuing mission to provide you with exceptional heart care, we have created designated Provider Care Teams.  These Care Teams include your primary Cardiologist (physician) and Advanced Practice Providers (APPs -  Physician Assistants and Nurse Practitioners) who all work together to provide you with the care you need, when you need it.  We recommend signing up for the patient portal called MyChart.  Sign up information is provided on this After Visit Summary.  MyChart is used to connect with patients for Virtual Visits (Telemedicine).  Patients are able to view lab/test results, encounter notes, upcoming appointments, etc.  Non-urgent messages can be sent to your provider as well.   To learn more about what you can do with MyChart, go to [email or web address].    Your next appointment:   1 month(s)  The format for your next appointment:   In Person  Provider:   Lamar Fitch, MD    Other Instructions NA

## 2024-05-10 NOTE — Progress Notes (Signed)
 Cardiology Office Note:    Date:  05/10/2024   ID:  Pamela [name], DOB [birth year], MRN [id number]  PCP:  Pamela Clapper, MD  Cardiologist:  Pamela Fitch, MD    Referring MD: Pamela Clapper, MD   No chief complaint on file.  Review current   History of Present Illness:    Pamela [name] is a 78 y.o. female  She was initially evaluated by cardiology in 2012 in the setting of PVCs, palpitations.  Event monitor per PCP showed PVCs, NSVT.  Nuclear stress test in 2012 was low risk.  Echocardiogram at the time showed normal LV function, G2 DD, no significant valvular abnormalities.  She was seen again in 2014 in the setting of increased palpitations.  Repeat echocardiogram showed EF 30 to 35%, diffuse hypokinesis, G1 DD, mild MR.  She underwent LHC which showed normal coronary arteries, low normal EF.  She established with Dr. Sheena in 2021 in the setting of orthostatic hypotension, shortness of breath, palpitations.  Echocardiogram in 12/2019 showed EF 65%, no RWMA, moderate LVH, G1 DD, normal RV systolic function, no significant valvular abnormalities.  Cardiac monitor in 09/2020 showed rare SVT, likely atrial tachycardia with variable block.  Coronary CTA in 09/2020 showed coronary calcium  score 20.4 (79 percentile), minimal CAD.  ABIs in 2023 in the setting of bilateral leg cramping were normal.  Echocardiogram in 04/2023 in the setting of increased palpitations, chest pain showed EF 60 to 65%, no RWMA, moderate asymmetric LVH, G1 DD, normal RV systolic function, no significant valvular abnormalities.  Cardiac PET stress test in 05/2023 showed normal perfusion images, no high risk findings.  Cardiac monitor in 07/2023 showed symptomatic NSVT, symptomatic frequent PACs.  Repeat cardiac catheterization in 12/2023 in the setting of increased shortness of breath on exertion, chest discomfort showed no angiographic evidence of CAD, LVEDP was elevated.  Thought to be in the setting of  uncontrolled hypertension, renal insufficiency, hyponatremia.  She was admitted and underwent IV diuresis.  Echocardiogram 12/08/2023 showed EF 45 to 50%, no RWMA, mild LVH, G1 DD, normal RV systolic function, mild MR.  She was seen in the ED on 01/17/2024 following a syncopal episode.  She reported blacking out.  She felt her symptoms were due to hypotension.  She reported injuring her left foot and ankle, negative for acute fracture.  She was discharged home in stable condition. She was last seen in the office on 01/18/2024 and was stable from a cardiac standpoint.  She reported nightly palpitations.  30-day event monitor was ordered, however, she only worse the monitor for 1 week.   Preliminary results revealed average heart rate 70 bpm, predominantly normal sinus rhythm, rare PACs and occasional PVCs (2% burden), no significant arrhythmia. Lasix  was changed to as needed use.  She was hospitalized from 01/22/2024 to 01/26/2024 in the setting of recurrent syncope.  She was positive for orthostatic hypotension. MRI of the brain was unremarkable.  Carotid ultrasounds showed no significant carotid artery stenosis.  She was treated for UTI.  Cardiology was consulted.  Permissive hypertension was recommended in the setting of orthostatic hypotension.  Outpatient follow-up with cardiology was recommended.  Her PCP contacted us  in the setting of hematuria.  She was advised to hold Eliquis  for 3 days.  Quite impressive evaluation has been performed so far trying to answer the question of why she had episode of syncope.  So far no clear-cut explanation thinking is that may be orthostatic hypotension however she described to be  3 episode of passing out.  1 episode when she was in a tractor supply she was going outside started feeling hot and went to the car try to get in and passed out.  She injured her ankle.  After that she felt lousy for couple hours.  However another episode she describes she was sitting in the chair  waiting for her doctor to see her another episode she described when she was sitting and then she said also that she is getting dizzy momentarily sensation sometimes when she is laying down that does not fit to orthostatic hypotension.  On top of that her EKG showed frequent ventricular ectopy, morphology of QRS complex indicated RVOT PVCs, bigeminal cycle observed.  She did wear a monitor did not show any significant arrhythmia.  But only for 1 week with not much symptomatology. Since the time she was evaluated last time she denies having any chest pain tightness squeezing pressure burning chest no syncope no dizziness she seems to be doing well now  Past Medical History:  Diagnosis Date   Abnormal CT of the chest 07/20/2023   Acquired thrombophilia (HCC) 06/27/2021   Acute combined systolic and diastolic heart failure (HCC) 12/08/2023   Acute cystitis without hematuria 06/12/2022   Acute heart failure with mildly reduced ejection fraction (HFmrEF, 41-49%) (HCC) 12/07/2023   Acute LUQ pain 02/12/2024   Acute on chronic diastolic (congestive) heart failure (HCC) 12/07/2023   Anemia in chronic kidney disease 09/06/2023   Anticoagulated on Coumadin     chronic--- managed by hematology/ oncology,   Asthma    Asthma, mild intermittent    followed by pcp--- per pt last exacerbation winter 2021 w/ acute bronchitis   Athscl heart disease of native coronary artery w/o ang pctrs 09/06/2023   Atrial fibrillation [I48.91] 10/01/2014   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, NYHA class 1 (HCC) 05/23/2011   Bilateral leg cramps    Blood dyscrasia    Bunion 08/23/2021   Cervical radiculopathy 07/19/2021   Cervical spondylosis 09/06/2023   Chest pain at rest 01/22/2024   Chronic constipation    Chronic diastolic congestive heart failure (HCC) 12/09/2020   Chronic pain of left ankle 03/31/2024   CKD (chronic kidney disease), stage III (HCC)    CKD stage 3a, GFR 45-59 ml/min (HCC)  11/05/2023   Closed bimalleolar fracture of left ankle 02/26/2020   Closed fracture of fifth metatarsal bone of left foot 12/06/2021   Closed fracture of fourth metatarsal bone 05/07/2021   Compression fracture of L1 lumbar vertebra (HCC) 12/17/2022   DDD (degenerative disc disease), cervical    w/ spondylosis,  per pt last steroid injection 06/ 2022   DDD (degenerative disc disease), lumbosacral    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   DJD (degenerative joint disease)    DVT (deep venous thrombosis) (HCC) 07/30/2013   Dyspnea    occasionally   Dyspnea on exertion 03/03/2013   Dysrhythmia    Dysuria 04/17/2023   Elevated rheumatoid factor 05/03/2024   Encntr for surgical aftcr following surgery on the circ sys 09/06/2023   Encounter for immunization 07/20/2023   Encounter for Medicare annual wellness exam 04/04/2024   Encounter for orthopedic follow-up care 03/10/2020   Flank pain 02/12/2024   GERD (gastroesophageal reflux disease)    Hammer toe 08/23/2021   Heart murmur    as a child     History of cardiac murmur as a child    History of DVT of lower extremity    left  lower extremity in 1980s, fell when bowling   History of falling 09/06/2023   History of pulmonary embolus (PE) 1993   per pt left lung post op 2 wks cholecystectomy   History of rheumatic fever as a child    per last echo 12-31-2019 no valvular issues   History of TIA (transient ischemic attack)    2014 and 2018 or 2019,  per pt no residual   HTN (hypertension)    followed by pcp   Hyperkalemia 03/31/2024   Hyperlipidemia    Hypertensive heart and chronic kidney disease with heart failure and stage 1 through stage 4 chronic kidney disease, or unspecified chronic kidney disease (HCC) 12/12/2023   Hyponatremia 02/06/2019   Na 126 on no obvious meds to cause it with last na 133 in 2014      Hypothyroidism    IDA (iron deficiency anemia)    Joint pain 10/24/2021   Low back strain 08/09/2018   Lumbar  radiculopathy 08/13/2021   Macular degeneration of both eyes    Mild obstructive sleep apnea    per pt dx 2017 tried to uses cpap but intolerant   Mixed incontinence urge and stress    urologist--- dr marda   Muscle pain 10/24/2021   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   OA (osteoarthritis) 07/06/2018   Osteoporosis    Other cardiomyopathies (HCC) 09/06/2023   Other instability, left foot 03/28/2024   Other microscopic hematuria 03/31/2024   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   PAF (paroxysmal atrial fibrillation) (HCC) 10/01/2014   cardiologist--- dr dub tobb;   cardiac cath 02-18-2013 normal coronaries arteries, ef 50%, cath done since echo showed ef 30-35%; nucleat stress study 04/ 2020 normal , normal echo 04/ 2021,  event monitor 09-14-2020 rare ST/AT variable block   Pain in left foot 12/17/2021   Pain of right hip joint 08/11/2022   Personal history of pneumonia (recurrent) 09/06/2023   Personal history of urinary (tract) infections 09/06/2023   Pneumonia    PONV (postoperative nausea and vomiting)    Presence of left artificial knee joint 09/06/2023   Protein S deficiency (HCC)    followed by hemotology/ oncology-- dr d. ezzard ([facility]) dx 1980s;  prior DVT left lower leg 1980s and left lung PE 1993; chronic  coumadin  since 1980s   PVC's (premature ventricular contractions)    followed by cardiology   S/P cardiac catheterization 02/2013   Normal coronaries; low normal EF at 50%   Skin cancer screening 02/12/2024   Solitary pulmonary nodule 09/06/2023   Solitary pulmonary nodule on lung CT 02/06/2019   First noted 01/13/2014 > no change as of 12/21/2018   Spinal stenosis of lumbar region 07/19/2021   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/22/2022   Spondylolisthesis, lumbar region 08/09/2018   Sprain of right ankle 10/13/2023   Stage 3b chronic  kidney disease (HCC) 12/17/2022   Stroke (HCC)    TIA in 2018 or 2019   Syncope 01/22/2024   Transient ischemic attack 07/30/2013   Traumatic arthritis of left ankle 02/12/2024   Type 2 diabetes mellitus treated with insulin  (HCC)    endocrilogist--- whitney reardon NP     (03-10-2021  pt continuously checks blood sugar throughout the day w/ Libre, fasting sugar --- 69--200)   Type 2 diabetes mellitus with diabetic neuropathy, with long-term current use of insulin  (HCC) 06/15/2021   Unilateral primary osteoarthritis, right knee 07/06/2018  Urinary frequency 02/12/2024   UTI (urinary tract infection) 01/23/2024   UTI (urinary tract infection) due to Enterococcus 01/22/2024   Ventricular premature depolarization 09/06/2023   Wears glasses    Wears hearing aid in both ears     Past Surgical History:  Procedure Laterality Date   ABDOMINAL HYSTERECTOMY     BLEPHAROPLASTY     CARDIAC CATHETERIZATION  02/18/2013   @ [facility]  by Dr Sw[place];  normal coronaries w/ preserved LVF, ef 50%;   previous cath 2001 normal ef 65%   CARPAL TUNNEL RELEASE Bilateral 1994   CATARACT EXTRACTION W/ INTRAOCULAR LENS IMPLANT Bilateral 2017   CHOLECYSTECTOMY     CHOLECYSTECTOMY, LAPAROSCOPIC  1993   COLONOSCOPY     EAR BIOPSY Left    FINGER SURGERY Left 2018   thumb   FOOT SURGERY     FOOT TENDON SURGERY Right    early 2000s   FRACTURE SURGERY Left 02/13/2020   left femur   HAND SURGERY Right 04/2022   HARDWARE REMOVAL Left 03/12/2021   Procedure: HARDWARE REMOVAL;  Surgeon: Sharl Selinda Dover, MD;  [facility];  Service: Orthopedics;  Laterality: Left;  60 MINS   JOINT REPLACEMENT     LEFT HEART CATH AND CORONARY ANGIOGRAPHY N/A 12/07/2023   Procedure: LEFT HEART CATH AND CORONARY ANGIOGRAPHY;  Surgeon: Verlin Lonni BIRCH, MD;  Location: [facility] INVASIVE CV LAB;  Service: Cardiovascular;  Laterality: N/A;   LUMBAR LAMINECTOMY/DECOMPRESSION MICRODISCECTOMY N/A 12/22/2022    Procedure: Lumbar Two-Lumbar Three, Lumbar Three-Lumbar Four Lumbar Decompression;  Surgeon: Colon Shove, MD;  Location: [facility] OR;  Service: Neurosurgery;  Laterality: N/A;  RM 20 3C   ORIF ANKLE FRACTURE Left 02/26/2020   Procedure: OPEN REDUCTION INTERNAL FIXATION (ORIF) ANKLE FRACTURE;  Surgeon: Sharl Selinda Dover, MD;  Location: [facility] OR;  Service: Orthopedics;  Laterality: Left;  90 mins   TONSILLECTOMY AND ADENOIDECTOMY Bilateral    TOTAL KNEE ARTHROPLASTY Left 03/2005   TOTAL VAGINAL HYSTERECTOMY  1988   per pt still has ovaries    Current Medications: Current Meds  Medication Sig   acetaminophen  (TYLENOL ) 650 MG CR tablet Take 650-1,300 mg by mouth every 8 (eight) hours as needed for pain.   amLODipine  (NORVASC ) 10 MG tablet Take 10 mg by mouth daily.   aspirin  EC (ASPIRIN  LOW DOSE) 81 MG tablet Take 1 tablet (81 mg total) by mouth daily. Swallow whole.   atorvastatin  (LIPITOR ) 80 MG tablet TAKE 1 TABLET(80 MG) BY MOUTH DAILY   blood glucose meter kit and supplies KIT Dispense based on patient and insurance preference. Use up to four times daily as directed.   carboxymethylcellulose (REFRESH PLUS) 0.5 % SOLN Place 1 drop into both eyes 3 (three) times daily as needed (dry eyes).   carvedilol  (COREG ) 12.5 MG tablet TAKE 1 TABLET(12.5 MG) BY MOUTH TWICE DAILY WITH A MEAL   Cholecalciferol (VITAMIN D3) 50 MCG (2000 UT) TABS Take 4,000 Units by mouth in the morning and at bedtime.   cyanocobalamin  (VITAMIN B12) 1000 MCG tablet Take 1,000 mcg by mouth daily with lunch.   Dulaglutide  (TRULICITY ) 0.75 MG/0.5ML SOAJ Inject 0.75 mg into the skin once a week.   ELIQUIS  5 MG TABS tablet TAKE 1 TABLET(5 MG) BY MOUTH TWICE DAILY   ferrous sulfate  325 (65 FE) MG tablet Take 325 mg by mouth daily with lunch.   furosemide  (LASIX ) 20 MG tablet Take 1 tablet (20 mg total) by mouth daily. TAKE 1 TABLET(20 MG) BY MOUTH DAILY  GEMTESA  75 MG TABS Take 1 tablet (75 mg total) by mouth at bedtime.    insulin  aspart (NOVOLOG  FLEXPEN) 100 UNIT/ML FlexPen Max daily 30 units   magnesium  oxide (MAG-OX) 400 (240 Mg) MG tablet TAKE 2 TABLETS(800 MG) BY MOUTH TWICE DAILY   methenamine  (HIPREX ) 1 g tablet Take 1 tablet (1 g total) by mouth 2 (two) times daily with a meal.   olmesartan  (BENICAR ) 40 MG tablet Take 1 tablet (40 mg total) by mouth daily.   ondansetron  (ZOFRAN -ODT) 8 MG disintegrating tablet Take 8 mg by mouth every 8 (eight) hours as needed for nausea or vomiting.    pantoprazole  (PROTONIX ) 40 MG tablet TAKE 1 TABLET(40 MG) BY MOUTH EVERY MORNING   pyridoxine  (B-6) 100 MG tablet Take 100 mg by mouth daily with lunch.   sennosides-docusate sodium  (SENOKOT-S) 8.6-50 MG tablet Take 1 tablet by mouth every other day.   sodium chloride  (OCEAN) 0.65 % SOLN nasal spray Place 1 spray into both nostrils as needed for congestion.   zolpidem  (AMBIEN ) 5 MG tablet TAKE 1 TABLET(5 MG) BY MOUTH AT BEDTIME AS NEEDED FOR SLEEP     Allergies:   Loxapine succinate, Macrodantin  [nitrofurantoin  macrocrystal], Naproxen sodium, Telithromycin, Amoxapine and related, Darvon, Levothyroxine  sodium, Belsomra [suvorexant], Cymbalta [duloxetine hcl], Duloxetine, Gabapentin , Lyrica  [pregabalin ], Naproxen, Ozempic  (0.25 or 0.5 mg-dose) [semaglutide (0.25 or 0.5mg -dos)], Semaglutide , Amoxicillin , and Propoxyphene   Social History   Socioeconomic History   Marital status: Married    Spouse name: Fairy   Number of children: Not on file   Years of education: Not on file   Highest education level: Some college, no degree  Occupational History   Not on file  Tobacco Use   Smoking status: Never   Smokeless tobacco: Never  Vaping Use   Vaping status: Never Used  Substance and Sexual Activity   Alcohol use: No   Drug use: Never   Sexual activity: Not on file    Comment: Hysterectomy  Other Topics Concern   Not on file  Social History Narrative   Lives with spouse in [place].  2 grown daughters.   Retired  Print production planner     Social Drivers of Corporate investment banker Strain: Low Risk  (03/29/2024)   Overall Financial Resource Strain (CARDIA)    Difficulty of Paying Living Expenses: Not very hard  Food Insecurity: No Food Insecurity (03/29/2024)   Hunger Vital Sign    Worried About Running Out of Food in the Last Year: Never true    Ran Out of Food in the Last Year: Never true  Transportation Needs: No Transportation Needs (03/29/2024)   PRAPARE - Administrator, Civil Service (Medical): No    Lack of Transportation (Non-Medical): No  Physical Activity: Inactive (03/29/2024)   Exercise Vital Sign    Days of Exercise per Week: 0 days    Minutes of Exercise per Session: 0 min  Stress: No Stress Concern Present (03/29/2024)   [employer] of Occupational Health - Occupational Stress Questionnaire    Feeling of Stress: Only a little  Social Connections: Socially Integrated (03/29/2024)   Social Connection and Isolation Panel    Frequency of Communication with Friends and Family: More than three times a week    Frequency of Social Gatherings with Friends and Family: Once a week    Attends Religious Services: More than 4 times per year    Active Member of [employer] or Organizations: Yes    Attends Banker Meetings:  More than 4 times per year    Marital Status: Married     Family History: The patient's family history includes Antithrombin III deficiency in her mother; Cirrhosis in her mother; Coronary artery disease in her father; Diabetes in her father and sister; Heart attack in her brother, father, and sister; Hypertension in her brother, father, and sister; Kidney disease in her father; Lung cancer in her brother; Protein S deficiency in her daughter; Ulcerative colitis in her mother. There is no history of Stroke, Colitis, Colon polyps, Esophageal cancer, Liver cancer, Pancreatic cancer, Rectal cancer, Stomach cancer, or Breast cancer. ROS:   Please see the history  of present illness.    All 14 point review of systems negative except as described per history of present illness  EKGs/Labs/Other Studies Reviewed:         Recent Labs: 01/23/2024: TSH 2.012 03/19/2024: BNP 331.4; Hemoglobin 11.8; Platelets 251 04/15/2024: ALT 26; BUN 37; Creatinine, Ser 0.98; Magnesium  1.8; NT-Pro BNP 1,046; Potassium 4.4; Sodium 138  Recent Lipid Panel    Component Value Date/Time   CHOL 125 01/31/2024 0827   TRIG 41 01/31/2024 0827   HDL 61 01/31/2024 0827   CHOLHDL 2.0 01/31/2024 0827   LDLCALC 54 01/31/2024 0827    Physical Exam:    VS:  BP (!) 150/62   Pulse (!) 105   Ht 5' 1 (1.549 m)   Wt 146 lb 6.4 oz (66.4 kg)   SpO2 96%   BMI 27.66 kg/m     Wt Readings from Last 3 Encounters:  05/10/24 146 lb 6.4 oz (66.4 kg)  05/08/24 144 lb (65.3 kg)  04/08/24 149 lb (67.6 kg)     GEN:  Well nourished, well developed in no acute distress HEENT: Normal NECK: No JVD; No carotid bruits LYMPHATICS: No lymphadenopathy CARDIAC: RRR, no murmurs, no rubs, no gallops RESPIRATORY:  Clear to auscultation without rales, wheezing or rhonchi  ABDOMEN: Soft, non-tender, non-distended MUSCULOSKELETAL:  No edema; No deformity  SKIN: Warm and dry LOWER EXTREMITIES: no swelling NEUROLOGIC:  Alert and oriented x 3 PSYCHIATRIC:  Normal affect   ASSESSMENT:    1. Essential hypertension   2. PAF (paroxysmal atrial fibrillation) (HCC)   3. 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, NYHA class 1 (HCC)   4. Acute heart failure with mildly reduced ejection fraction (HFmrEF, 41-49%) (HCC)   5. PVC's (premature ventricular contractions)   6. Syncope, unspecified syncope type    PLAN:    In order of problems listed above:  Syncope: Still unexplained.  She did wear a monitor for about a week.  However nothing dramatic has been identified.  I will ask her to have another monitor for up to 2 weeks I am worried about potential arrhythmia she may require an  implantable loop recorder.  In the meantime ask her not to alter any of her medications. Paroxysmal atrial fibrillation she is on Eliquis  5 mg twice a day which I will continue for now. Heart failure with mildly reduced left ventricle ejection fraction about 45%.  I am reluctant to change any of her medication right now because of syncope and for now she is feeling quite well. PVCs will put monitor on continue with Coreg  for now   Medication Adjustments/Labs and Tests Ordered: Current medicines are reviewed at length with the patient today.  Concerns regarding medicines are outlined above.  Orders Placed This Encounter  Procedures   EKG 12-Lead   Medication changes: No orders of the defined  types were placed in this encounter.   Signed, Pamela DOROTHA Fitch, MD, [facility] 05/10/2024 1:50 PM    [facility]

## 2024-05-10 NOTE — Progress Notes (Signed)
 error

## 2024-05-15 ENCOUNTER — Other Ambulatory Visit: Payer: Self-pay

## 2024-05-15 ENCOUNTER — Ambulatory Visit: Admitting: Cardiology

## 2024-05-15 MED ORDER — ACCU-CHEK GUIDE TEST VI STRP: ORAL_STRIP | 5 refills | Status: AC

## 2024-05-16 ENCOUNTER — Institutional Professional Consult (permissible substitution) (HOSPITAL_BASED_OUTPATIENT_CLINIC_OR_DEPARTMENT_OTHER): Admitting: Family

## 2024-05-21 ENCOUNTER — Other Ambulatory Visit: Payer: Self-pay

## 2024-05-21 ENCOUNTER — Other Ambulatory Visit

## 2024-05-21 DIAGNOSIS — E1140 Type 2 diabetes mellitus with diabetic neuropathy, unspecified: Secondary | ICD-10-CM

## 2024-05-21 DIAGNOSIS — E782 Mixed hyperlipidemia: Secondary | ICD-10-CM

## 2024-05-21 DIAGNOSIS — I130 Hypertensive heart and chronic kidney disease with heart failure and stage 1 through stage 4 chronic kidney disease, or unspecified chronic kidney disease: Secondary | ICD-10-CM

## 2024-05-22 ENCOUNTER — Other Ambulatory Visit

## 2024-05-22 DIAGNOSIS — Z794 Long term (current) use of insulin: Secondary | ICD-10-CM | POA: Diagnosis not present

## 2024-05-22 DIAGNOSIS — I130 Hypertensive heart and chronic kidney disease with heart failure and stage 1 through stage 4 chronic kidney disease, or unspecified chronic kidney disease: Secondary | ICD-10-CM | POA: Diagnosis not present

## 2024-05-22 DIAGNOSIS — E1140 Type 2 diabetes mellitus with diabetic neuropathy, unspecified: Secondary | ICD-10-CM | POA: Diagnosis not present

## 2024-05-22 DIAGNOSIS — E782 Mixed hyperlipidemia: Secondary | ICD-10-CM

## 2024-05-22 LAB — CBC WITH DIFFERENTIAL/PLATELET
Basophils Absolute: 0.1 x10E3/uL (ref 0.0–0.2)
Basos: 1 %
EOS (ABSOLUTE): 0.8 x10E3/uL — ABNORMAL HIGH (ref 0.0–0.4)
Eos: 10 %
Hematocrit: 37.1 % (ref 34.0–46.6)
Hemoglobin: 11.9 g/dL (ref 11.1–15.9)
Immature Grans (Abs): 0 x10E3/uL (ref 0.0–0.1)
Immature Granulocytes: 0 %
Lymphocytes Absolute: 1.4 x10E3/uL (ref 0.7–3.1)
Lymphs: 17 %
MCH: 30.4 pg (ref 26.6–33.0)
MCHC: 32.1 g/dL (ref 31.5–35.7)
MCV: 95 fL (ref 79–97)
Monocytes Absolute: 1 x10E3/uL — ABNORMAL HIGH (ref 0.1–0.9)
Monocytes: 12 %
Neutrophils Absolute: 4.9 x10E3/uL (ref 1.4–7.0)
Neutrophils: 60 %
Platelets: 284 x10E3/uL (ref 150–450)
RBC: 3.92 x10E6/uL (ref 3.77–5.28)
RDW: 12 % (ref 11.7–15.4)
WBC: 8.1 x10E3/uL (ref 3.4–10.8)

## 2024-05-22 LAB — COMPREHENSIVE METABOLIC PANEL WITH GFR
ALT: 27 IU/L (ref 0–32)
AST: 26 IU/L (ref 0–40)
Albumin: 3.8 g/dL (ref 3.8–4.8)
Alkaline Phosphatase: 126 IU/L (ref 49–135)
BUN/Creatinine Ratio: 21 (ref 12–28)
BUN: 26 mg/dL (ref 8–27)
Bilirubin Total: 0.4 mg/dL (ref 0.0–1.2)
CO2: 24 mmol/L (ref 20–29)
Calcium: 9.3 mg/dL (ref 8.7–10.3)
Chloride: 98 mmol/L (ref 96–106)
Creatinine, Ser: 1.24 mg/dL — ABNORMAL HIGH (ref 0.57–1.00)
Globulin, Total: 2.2 g/dL (ref 1.5–4.5)
Glucose: 187 mg/dL — ABNORMAL HIGH (ref 70–99)
Potassium: 4.4 mmol/L (ref 3.5–5.2)
Sodium: 136 mmol/L (ref 134–144)
Total Protein: 6 g/dL (ref 6.0–8.5)
eGFR: 44 mL/min/1.73 — ABNORMAL LOW (ref >59)

## 2024-05-22 LAB — LIPID PANEL
Chol/HDL Ratio: 2.2 ratio (ref 0.0–4.4)
Cholesterol, Total: 110 mg/dL (ref 100–199)
HDL: 49 mg/dL (ref >39)
LDL Chol Calc (NIH): 49 mg/dL (ref 0–99)
Triglycerides: 52 mg/dL (ref 0–149)
VLDL Cholesterol Cal: 12 mg/dL (ref 5–40)

## 2024-05-22 LAB — HEMOGLOBIN A1C
Est. average glucose Bld gHb Est-mCnc: 171 mg/dL
Hgb A1c MFr Bld: 7.6 % — ABNORMAL HIGH (ref 4.8–5.6)

## 2024-05-23 ENCOUNTER — Ambulatory Visit: Payer: Self-pay | Admitting: Family Medicine

## 2024-05-23 ENCOUNTER — Encounter: Payer: Self-pay | Admitting: Family Medicine

## 2024-05-23 ENCOUNTER — Ambulatory Visit (INDEPENDENT_AMBULATORY_CARE_PROVIDER_SITE_OTHER): Admitting: Family Medicine

## 2024-05-23 VITALS — BP 132/58 | HR 78 | Temp 98.0°F | Ht 61.0 in | Wt 149.0 lb

## 2024-05-23 DIAGNOSIS — M79661 Pain in right lower leg: Secondary | ICD-10-CM

## 2024-05-23 DIAGNOSIS — I5021 Acute systolic (congestive) heart failure: Secondary | ICD-10-CM | POA: Diagnosis not present

## 2024-05-23 DIAGNOSIS — N3946 Mixed incontinence: Secondary | ICD-10-CM | POA: Diagnosis not present

## 2024-05-23 DIAGNOSIS — N1831 Chronic kidney disease, stage 3a: Secondary | ICD-10-CM | POA: Diagnosis not present

## 2024-05-23 DIAGNOSIS — R600 Localized edema: Secondary | ICD-10-CM

## 2024-05-23 DIAGNOSIS — Z23 Encounter for immunization: Secondary | ICD-10-CM | POA: Diagnosis not present

## 2024-05-23 DIAGNOSIS — R110 Nausea: Secondary | ICD-10-CM | POA: Diagnosis not present

## 2024-05-23 DIAGNOSIS — E1140 Type 2 diabetes mellitus with diabetic neuropathy, unspecified: Secondary | ICD-10-CM | POA: Diagnosis not present

## 2024-05-23 DIAGNOSIS — I480 Paroxysmal atrial fibrillation: Secondary | ICD-10-CM | POA: Diagnosis not present

## 2024-05-23 DIAGNOSIS — I110 Hypertensive heart disease with heart failure: Secondary | ICD-10-CM | POA: Diagnosis not present

## 2024-05-23 DIAGNOSIS — I5030 Unspecified diastolic (congestive) heart failure: Secondary | ICD-10-CM

## 2024-05-23 DIAGNOSIS — Z794 Long term (current) use of insulin: Secondary | ICD-10-CM

## 2024-05-23 DIAGNOSIS — J301 Allergic rhinitis due to pollen: Secondary | ICD-10-CM

## 2024-05-23 MED ORDER — FLUTICASONE PROPIONATE 50 MCG/ACT NA SUSP: 2.0000 | Freq: Every day | NASAL | 6 refills | Status: AC

## 2024-05-23 MED ORDER — ONDANSETRON 8 MG PO TBDP: 8.0000 mg | ORAL_TABLET | Freq: Three times a day (TID) | ORAL | 2 refills | Status: AC | PRN

## 2024-05-23 MED ORDER — KERENDIA 10 MG PO TABS: 10.0000 mg | ORAL_TABLET | Freq: Every day | ORAL | Status: DC

## 2024-05-23 MED ORDER — COVID-19 MRNA VAC-TRIS(PFIZER) 30 MCG/0.3ML IM SUSY: 0.3000 mL | PREFILLED_SYRINGE | Freq: Once | INTRAMUSCULAR | 0 refills | Status: AC

## 2024-05-23 NOTE — Progress Notes (Unsigned)
 Subjective:  Patient ID: Pamela [name], female    DOB: Apr 28, 1946  Age: 78 y.o. MRN: [id number]  Chief Complaint  Patient presents with   Medical Management of Chronic Issues    HPI: Discussed the use of AI scribe software for clinical note transcription with the patient, who gave verbal consent to proceed.  History of Present Illness Pamela [name] is a 78 year old female with atrial fibrillation and congestive heart failure who presents for follow-up and monitoring of her heart condition.  Cardiac arrhythmia and heart failure symptoms - Atrial fibrillation managed with Eliquis  5 mg twice daily - Congestive heart failure managed with carvedilol  12.5 mg twice daily, Benicar  40 mg once daily, amlodipine  10 mg once daily, and furosemide  20 mg once daily - Currently wearing a heart monitor, scheduled for removal tomorrow, due to fluctuating blood pressure and episodes of syncope - Blood pressure has reached as high as 160/50 mmHg, but has not exceeded 150 mmHg three times consecutively - Last echocardiogram showed ejection fraction of 45% - Chest pain and breathing problems occur only when extremely tired  Syncope and blood pressure fluctuations - Episodes of syncope prompting heart monitor placement - Fluctuating blood pressure with systolic readings up to 160 mmHg  Peripheral edema and fluid retention - On Gemtesa  for bladder control, which has reduced leakage - Experiences fluid retention and swollen feet by nightfall  Gastrointestinal symptoms - Nausea triggered by the smell of food - Requests refill of Zofran  for nausea  Glycemic control and diabetes management - Diabetes managed with Trulicity  0.75 mg once weekly and Novolog  insulin , 6 units on a sliding scale with occasional additional 2 units - Blood sugar was 155 mg/dL this morning - Last J8r was 7.6%, increased from 7.3%  Musculoskeletal pain and mobility - History of knee replacement -  Right knee pain with occasional instability, suspected recent strain from prolonged sitting - Uses compression stockings for relief - Considering physical therapy for stability and balance - Recently resumed walking for exercise, completing four laps from kitchen to mailbox  Sleep disturbance - Sleep is inconsistent  Upper respiratory symptoms - Runny nose, prefers not to take additional medication  Medication and supplement use - Takes Tylenol  regularly, one to two tablets every day or every other day depending on activity - Takes B12, B6, and D3 supplements - Atorvastatin  80 mg once daily for cholesterol       03/29/2024   10:25 AM 04/17/2023    8:55 AM 03/30/2023    2:14 PM 12/14/2022    9:06 AM 02/15/2022    1:16 PM  Depression screen PHQ 2/9  Decreased Interest 0 1 0 0 0  Down, Depressed, Hopeless 0 0 0 0 0  PHQ - 2 Score 0 1 0 0 0  Altered sleeping 2 3     Tired, decreased energy 3 2     Change in appetite 0 0     Feeling bad or failure about yourself  1 0     Trouble concentrating 0 1     Moving slowly or fidgety/restless 2 1     Suicidal thoughts 0 0     PHQ-9 Score 8 8     Difficult doing work/chores Somewhat difficult Somewhat difficult           03/29/2024   10:25 AM  Fall Risk   Falls in the past year? 1  Number falls in past yr: 1  Injury with Fall? 1  Risk for fall due to : Impaired balance/gait  Follow up Falls evaluation completed    Patient Care Team: Sherre Clapper, MD as PCP - General (Internal Medicine) Sheena Pugh, DO as PCP - Cardiology (Cardiology) Ezzard Valaria LABOR, MD as Consulting Physician (Oncology) Arloa Brunner, OD (Optometry) Ortho, [facility] (Specialist) Darlean Ozell NOVAK, MD as Consulting Physician (Pulmonary Disease) Ethyl Lonni BRAVO, MD (Inactive) as Consulting Physician (Otolaryngology) Kassie Mallick, MD (Inactive) as Consulting Physician (Endocrinology) Ishmael Slough, MD as Consulting Physician (Rheumatology) Armbruster, Elspeth SQUIBB, MD as Consulting Physician (Gastroenterology) Arloa Brunner, OD (Optometry) Bernie Lamar PARAS, MD as Consulting Physician (Cardiology)   Review of Systems  Current Outpatient Medications on File Prior to Visit  Medication Sig Dispense Refill   acetaminophen  (TYLENOL ) 650 MG CR tablet Take 650-1,300 mg by mouth every 8 (eight) hours as needed for pain.     amLODipine  (NORVASC ) 10 MG tablet Take 10 mg by mouth daily.     aspirin  EC (ASPIRIN  LOW DOSE) 81 MG tablet Take 1 tablet (81 mg total) by mouth daily. Swallow whole. 90 tablet 3   atorvastatin  (LIPITOR ) 80 MG tablet TAKE 1 TABLET(80 MG) BY MOUTH DAILY 90 tablet 3   blood glucose meter kit and supplies KIT Dispense based on patient and insurance preference. Use up to four times daily as directed. 1 each 0   carboxymethylcellulose (REFRESH PLUS) 0.5 % SOLN Place 1 drop into both eyes 3 (three) times daily as needed (dry eyes).     carvedilol  (COREG ) 12.5 MG tablet TAKE 1 TABLET(12.5 MG) BY MOUTH TWICE DAILY WITH A MEAL 60 tablet 3   Cholecalciferol (VITAMIN D3) 50 MCG (2000 UT) TABS Take 4,000 Units by mouth in the morning and at bedtime.     cyanocobalamin  (VITAMIN B12) 1000 MCG tablet Take 1,000 mcg by mouth daily with lunch.     Dulaglutide  (TRULICITY ) 0.75 MG/0.5ML SOAJ Inject 0.75 mg into the skin once a week. 6 mL 3   ferrous sulfate  325 (65 FE) MG tablet Take 325 mg by mouth daily with lunch.     furosemide  (LASIX ) 20 MG tablet Take 1 tablet (20 mg total) by mouth daily. TAKE 1 TABLET(20 MG) BY MOUTH DAILY 90 tablet 0   GEMTESA  75 MG TABS Take 1 tablet (75 mg total) by mouth at bedtime. 90 tablet 3   glucose blood (ACCU-CHEK GUIDE TEST) test strip Use to check blood sugar up to 4 times per day 400 each 5   insulin  aspart (NOVOLOG  FLEXPEN) 100 UNIT/ML FlexPen Max daily 30 units 30 mL 3   magnesium  oxide (MAG-OX) 400 (240 Mg) MG tablet TAKE 2 TABLETS(800 MG) BY MOUTH TWICE DAILY 120 tablet 2   methenamine  (HIPREX ) 1 g tablet Take 1  tablet (1 g total) by mouth 2 (two) times daily with a meal. 60 tablet 11   olmesartan  (BENICAR ) 40 MG tablet Take 1 tablet (40 mg total) by mouth daily. 90 tablet 1   pantoprazole  (PROTONIX ) 40 MG tablet TAKE 1 TABLET(40 MG) BY MOUTH EVERY MORNING 90 tablet 1   pyridoxine  (B-6) 100 MG tablet Take 100 mg by mouth daily with lunch.     sennosides-docusate sodium  (SENOKOT-S) 8.6-50 MG tablet Take 1 tablet by mouth every other day.     sodium chloride  (OCEAN) 0.65 % SOLN nasal spray Place 1 spray into both nostrils as needed for congestion.     zolpidem  (AMBIEN ) 5 MG tablet TAKE 1 TABLET(5 MG) BY MOUTH AT BEDTIME AS NEEDED FOR SLEEP 30  tablet 2   No current facility-administered medications on file prior to visit.   Past Medical History:  Diagnosis Date   Abnormal CT of the chest 07/20/2023   Acquired thrombophilia 06/27/2021   Acute combined systolic and diastolic heart failure (HCC) 12/08/2023   Acute cystitis without hematuria 06/12/2022   Acute heart failure with mildly reduced ejection fraction (HFmrEF, 41-49%) (HCC) 12/07/2023   Acute LUQ pain 02/12/2024   Acute on chronic diastolic (congestive) heart failure (HCC) 12/07/2023   Anemia in chronic kidney disease 09/06/2023   Anticoagulated on Coumadin     chronic--- managed by hematology/ oncology,   Asthma    Asthma, mild intermittent    followed by pcp--- per pt last exacerbation winter 2021 w/ acute bronchitis   Athscl heart disease of native coronary artery w/o ang pctrs 09/06/2023   Atrial fibrillation [I48.91] 10/01/2014   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, NYHA class 1 (HCC) 05/23/2011   Bilateral leg cramps    Blood dyscrasia    Bunion 08/23/2021   Cervical radiculopathy 07/19/2021   Cervical spondylosis 09/06/2023   Chest pain at rest 01/22/2024   Chronic constipation    Chronic diastolic congestive heart failure (HCC) 12/09/2020   Chronic pain of left ankle 03/31/2024   CKD (chronic kidney disease),  stage III (HCC)    CKD stage 3a, GFR 45-59 ml/min (HCC) 11/05/2023   Closed bimalleolar fracture of left ankle 02/26/2020   Closed fracture of fifth metatarsal bone of left foot 12/06/2021   Closed fracture of fourth metatarsal bone 05/07/2021   Compression fracture of L1 lumbar vertebra (HCC) 12/17/2022   DDD (degenerative disc disease), cervical    w/ spondylosis,  per pt last steroid injection 06/ 2022   DDD (degenerative disc disease), lumbosacral    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   DJD (degenerative joint disease)    DVT (deep venous thrombosis) (HCC) 07/30/2013   Dyspnea    occasionally   Dyspnea on exertion 03/03/2013   Dysrhythmia    Dysuria 04/17/2023   Elevated rheumatoid factor 05/03/2024   Encntr for surgical aftcr following surgery on the circ sys 09/06/2023   Encounter for immunization 07/20/2023   Encounter for Medicare annual wellness exam 04/04/2024   Encounter for orthopedic follow-up care 03/10/2020   Flank pain 02/12/2024   GERD (gastroesophageal reflux disease)    Hammer toe 08/23/2021   Heart murmur    as a child     History of cardiac murmur as a child    History of DVT of lower extremity    left lower extremity in 1980s, fell when bowling   History of falling 09/06/2023   History of pulmonary embolus (PE) 1993   per pt left lung post op 2 wks cholecystectomy   History of rheumatic fever as a child    per last echo 12-31-2019 no valvular issues   History of TIA (transient ischemic attack)    2014 and 2018 or 2019,  per pt no residual   HTN (hypertension)    followed by pcp   Hyperkalemia 03/31/2024   Hyperlipidemia    Hypertensive heart and chronic kidney disease with heart failure and stage 1 through stage 4 chronic kidney disease, or unspecified chronic kidney disease (HCC) 12/12/2023   Hyponatremia 02/06/2019   Na 126 on no obvious meds to cause it with last na 133 in 2014      Hypothyroidism    IDA (iron deficiency anemia)    Joint  pain 10/24/2021   Low  back strain 08/09/2018   Lumbar radiculopathy 08/13/2021   Macular degeneration of both eyes    Mild obstructive sleep apnea    per pt dx 2017 tried to uses cpap but intolerant   Mixed incontinence urge and stress    urologist--- dr marda   Muscle pain 10/24/2021   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   OA (osteoarthritis) 07/06/2018   Osteoporosis    Other cardiomyopathies (HCC) 09/06/2023   Other instability, left foot 03/28/2024   Other microscopic hematuria 03/31/2024   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   PAF (paroxysmal atrial fibrillation) (HCC) 10/01/2014   cardiologist--- dr dub tobb;   cardiac cath 02-18-2013 normal coronaries arteries, ef 50%, cath done since echo showed ef 30-35%; nucleat stress study 04/ 2020 normal , normal echo 04/ 2021,  event monitor 09-14-2020 rare ST/AT variable block   Pain in left foot 12/17/2021   Pain of right hip joint 08/11/2022   Personal history of pneumonia (recurrent) 09/06/2023   Personal history of urinary (tract) infections 09/06/2023   Pneumonia    PONV (postoperative nausea and vomiting)    Presence of left artificial knee joint 09/06/2023   Protein S deficiency    followed by hemotology/ oncology-- dr d. ezzard ([facility]) dx 1980s;  prior DVT left lower leg 1980s and left lung PE 1993; chronic  coumadin  since 1980s   PVC's (premature ventricular contractions)    followed by cardiology   S/P cardiac catheterization 02/2013   Normal coronaries; low normal EF at 50%   Skin cancer screening 02/12/2024   Solitary pulmonary nodule 09/06/2023   Solitary pulmonary nodule on lung CT 02/06/2019   First noted 01/13/2014 > no change as of 12/21/2018   Spinal stenosis of lumbar region 07/19/2021   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/22/2022   Spondylolisthesis, lumbar region 08/09/2018   Sprain of  right ankle 10/13/2023   Stage 3b chronic kidney disease (HCC) 12/17/2022   Stroke (HCC)    TIA in 2018 or 2019   Syncope 01/22/2024   Transient ischemic attack 07/30/2013   Traumatic arthritis of left ankle 02/12/2024   Type 2 diabetes mellitus treated with insulin  (HCC)    endocrilogist--- whitney reardon NP     (03-10-2021  pt continuously checks blood sugar throughout the day w/ Libre, fasting sugar --- 69--200)   Type 2 diabetes mellitus with diabetic neuropathy, with long-term current use of insulin  (HCC) 06/15/2021   Unilateral primary osteoarthritis, right knee 07/06/2018   Urinary frequency 02/12/2024   UTI (urinary tract infection) 01/23/2024   UTI (urinary tract infection) due to Enterococcus 01/22/2024   Ventricular premature depolarization 09/06/2023   Wears glasses    Wears hearing aid in both ears    Past Surgical History:  Procedure Laterality Date   ABDOMINAL HYSTERECTOMY     BLEPHAROPLASTY     CARDIAC CATHETERIZATION  02/18/2013   @ [facility]  by Dr Sw[place];  normal coronaries w/ preserved LVF, ef 50%;   previous cath 2001 normal ef 65%   CARPAL TUNNEL RELEASE Bilateral 1994   CATARACT EXTRACTION W/ INTRAOCULAR LENS IMPLANT Bilateral 2017   CHOLECYSTECTOMY     CHOLECYSTECTOMY, LAPAROSCOPIC  1993   COLONOSCOPY     EAR BIOPSY Left    FINGER SURGERY Left 2018   thumb   FOOT SURGERY     FOOT TENDON SURGERY Right    early 2000s   FRACTURE SURGERY Left 02/13/2020  left femur   HAND SURGERY Right 04/2022   HARDWARE REMOVAL Left 03/12/2021   Procedure: HARDWARE REMOVAL;  Surgeon: Sharl Selinda Dover, MD;  Location: [facility];  Service: Orthopedics;  Laterality: Left;  60 MINS   JOINT REPLACEMENT     LEFT HEART CATH AND CORONARY ANGIOGRAPHY N/A 12/07/2023   Procedure: LEFT HEART CATH AND CORONARY ANGIOGRAPHY;  Surgeon: Verlin Lonni BIRCH, MD;  Location: [facility] INVASIVE CV [facility];  Service: Cardiovascular;  Laterality: N/A;   LUMBAR LAMINECTOMY/DECOMPRESSION  MICRODISCECTOMY N/A 12/22/2022   Procedure: Lumbar Two-Lumbar Three, Lumbar Three-Lumbar Four Lumbar Decompression;  Surgeon: Colon Shove, MD;  Location: [facility] OR;  Service: Neurosurgery;  Laterality: N/A;  RM 20 3C   ORIF ANKLE FRACTURE Left 02/26/2020   Procedure: OPEN REDUCTION INTERNAL FIXATION (ORIF) ANKLE FRACTURE;  Surgeon: Sharl Selinda Dover, MD;  Location: [facility] OR;  Service: Orthopedics;  Laterality: Left;  90 mins   TONSILLECTOMY AND ADENOIDECTOMY Bilateral    TOTAL KNEE ARTHROPLASTY Left 03/2005   TOTAL VAGINAL HYSTERECTOMY  1988   per pt still has ovaries    Family History  Problem Relation Age of Onset   Cirrhosis Mother    Antithrombin III deficiency Mother        multiple emboli   Ulcerative colitis Mother    Diabetes Father    Coronary artery disease Father    Heart attack Father    Hypertension Father    Kidney disease Father    Hypertension Sister    Diabetes Sister    Heart attack Sister        age 14    Protein S deficiency Daughter    Heart attack Brother    Hypertension Brother    Lung cancer Brother    Stroke Neg Hx    Colitis Neg Hx    Colon polyps Neg Hx    Esophageal cancer Neg Hx    Liver cancer Neg Hx    Pancreatic cancer Neg Hx    Rectal cancer Neg Hx    Stomach cancer Neg Hx    Breast cancer Neg Hx    Social History   Socioeconomic History   Marital status: Married    Spouse name: Fairy   Number of children: Not on file   Years of education: Not on file   Highest education level: Some college, no degree  Occupational History   Not on file  Tobacco Use   Smoking status: Never   Smokeless tobacco: Never  Vaping Use   Vaping status: Never Used  Substance and Sexual Activity   Alcohol use: No   Drug use: Never   Sexual activity: Not on file    Comment: Hysterectomy  Other Topics Concern   Not on file  Social History Narrative   Lives with spouse in [place].  2 grown daughters.   Retired Print production planner     Social Drivers of  Corporate investment banker Strain: Low Risk  (03/29/2024)   Overall Financial Resource Strain (CARDIA)    Difficulty of Paying Living Expenses: Not very hard  Food Insecurity: No Food Insecurity (03/29/2024)   Hunger Vital Sign    Worried About Running Out of Food in the Last Year: Never true    Ran Out of Food in the Last Year: Never true  Transportation Needs: No Transportation Needs (03/29/2024)   PRAPARE - Administrator, Civil Service (Medical): No    Lack of Transportation (Non-Medical): No  Physical Activity: Inactive (03/29/2024)  Exercise Vital Sign    Days of Exercise per Week: 0 days    Minutes of Exercise per Session: 0 min  Stress: No Stress Concern Present (03/29/2024)   [employer] of Occupational Health - Occupational Stress Questionnaire    Feeling of Stress: Only a little  Social Connections: Socially Integrated (03/29/2024)   Social Connection and Isolation Panel    Frequency of Communication with Friends and Family: More than three times a week    Frequency of Social Gatherings with Friends and Family: Once a week    Attends Religious Services: More than 4 times per year    Active Member of [employer] or Organizations: Yes    Attends Engineer, structural: More than 4 times per year    Marital Status: Married    Objective:  BP (!) 132/58   Pulse 78   Temp 98 F (36.7 C)   Ht 5' 1 (1.549 m)   Wt 149 lb (67.6 kg)   SpO2 98%   BMI 28.15 kg/m      05/23/2024    8:27 AM 05/10/2024   12:59 PM 05/08/2024   10:19 AM  BP/Weight  Systolic BP 132 150 137  Diastolic BP 58 62 71  Wt. (Lbs) 149 146.4 144  BMI 28.15 kg/m2 27.66 kg/m2 28.12 kg/m2    Physical Exam Vitals reviewed.  Constitutional:      Appearance: Normal appearance. She is normal weight.  Neck:     Vascular: No carotid bruit.  Cardiovascular:     Rate and Rhythm: Normal rate. Rhythm irregular.     Pulses: Normal pulses.     Heart sounds: Normal heart sounds.  Pulmonary:      Effort: Pulmonary effort is normal. No respiratory distress.     Breath sounds: Normal breath sounds.  Abdominal:     General: Abdomen is flat. Bowel sounds are normal.     Palpations: Abdomen is soft.     Tenderness: There is no abdominal tenderness.  Neurological:     Mental Status: She is alert and oriented to person, place, and time.  Psychiatric:        Mood and Affect: Mood normal.        Behavior: Behavior normal.      Diabetic foot exam was performed with the following findings:   No deformities, ulcerations, or other skin breakdown Normal sensation of 10g monofilament Intact posterior tibialis and dorsalis pedis pulses      Lab Results  Component Value Date   WBC 8.1 05/22/2024   HGB 11.9 05/22/2024   HCT 37.1 05/22/2024   PLT 284 05/22/2024   GLUCOSE 187 (H) 05/22/2024   CHOL 110 05/22/2024   TRIG 52 05/22/2024   HDL 49 05/22/2024   LDLCALC 49 05/22/2024   ALT 27 05/22/2024   AST 26 05/22/2024   NA 136 05/22/2024   K 4.4 05/22/2024   CL 98 05/22/2024   CREATININE 1.24 (H) 05/22/2024   BUN 26 05/22/2024   CO2 24 05/22/2024   TSH 2.012 01/23/2024   INR 2.2 07/24/2023   HGBA1C 7.6 (H) 05/22/2024      Assessment & Plan:  Acute heart failure with mildly reduced ejection fraction (HFmrEF, 41-49%) (HCC) Assessment & Plan: Managed with carvedilol , olmesartan , amlodipine , and Gerosamide.   Paroxysmal atrial fibrillation (HCC) Assessment & Plan: Managed with Eliquis  5 mg twice daily.   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, NYHA class 1 (HCC)  Type 2 diabetes mellitus with diabetic neuropathy,  with long-term current use of insulin  [facility]) Assessment & Plan: - Discuss potential reintroduction of Farxiga  with Dr. Delbert.  Orders: -     Comprehensive metabolic panel with GFR; Future -     Microalbumin / creatinine urine ratio  CKD stage 3a, GFR 45-59 ml/min (HCC) Assessment & Plan: - Start Kerendia  10 mg once daily. - Check  potassium levels in one month. - Check urine for protein.  Orders: -     Kerendia ; Take 1 tablet (10 mg total) by mouth daily.  Edema of lower extremity Assessment & Plan: Possibly related to Gemtesa  use, with evening swelling.   Mixed incontinence urge and stress Assessment & Plan: Managed with Gemtesa , reducing leakage but possibly causing edema.   Nausea Assessment & Plan: - Prescribe Zofran  for nausea.   Seasonal allergic rhinitis due to pollen Assessment & Plan: - Prescribe Flonase  nasal spray.   Pain in right shin Assessment & Plan: - Use Tylenol  as needed for pain. - Consider Aspercreme for topical relief.   Encounter for immunization -     Flu vaccine HIGH DOSE PF(Fluzone Trivalent)  Other orders -     Ondansetron ; Take 1 tablet (8 mg total) by mouth every 8 (eight) hours as needed for nausea or vomiting.  Dispense: 30 tablet; Refill: 2 -     Fluticasone  Propionate; Place 2 sprays into both nostrils daily.  Dispense: 16 g; Refill: 6 -     COVID-19 mRNA Vac-TriS(Pfizer); Inject 0.3 mLs into the muscle once for 1 dose.  Dispense: 0.3 mL; Refill: 0     Body mass index is 28.15 kg/m.    Meds ordered this encounter  Medications   ondansetron  (ZOFRAN -ODT) 8 MG disintegrating tablet    Sig: Take 1 tablet (8 mg total) by mouth every 8 (eight) hours as needed for nausea or vomiting.    Dispense:  30 tablet    Refill:  2   fluticasone  (FLONASE ) 50 MCG/ACT nasal spray    Sig: Place 2 sprays into both nostrils daily.    Dispense:  16 g    Refill:  6   Finerenone  (KERENDIA ) 10 MG TABS    Sig: Take 1 tablet (10 mg total) by mouth daily.   COVID-19 mRNA vaccine, Pfizer, (COMIRNATY) syringe    Sig: Inject 0.3 mLs into the muscle once for 1 dose.    Dispense:  0.3 mL    Refill:  0    Orders Placed This Encounter  Procedures   Flu vaccine HIGH DOSE PF(Fluzone Trivalent)   Comprehensive metabolic panel with GFR   Microalbumin / creatinine urine ratio    Follow-up: Return in about 3 months (around 08/22/2024) for chronic follow up, lab visit (CMP) in 4 weeks. SABRA LILLETTE Kato I Leal-Borjas,acting as a scribe for Abigail Free, MD.,have documented all relevant documentation on the behalf of Abigail Free, MD,as directed by  Abigail Free, MD while in the presence of Abigail Free, MD.    An After Visit Summary was printed and given to the patient.  I attest that I have reviewed this visit and agree with the plan scribed by my staff.   Abigail Free, MD [facility] [phone]

## 2024-05-23 NOTE — Patient Instructions (Addendum)
  VISIT SUMMARY: You came in for a follow-up visit to monitor your heart condition, diabetes, and other health concerns. We discussed your medications, symptoms, and made some adjustments to your treatment plan.  YOUR PLAN: CONGESTIVE HEART FAILURE WITH REDUCED EJECTION FRACTION: Your heart condition is being managed with carvedilol , olmesartan , amlodipine , and Furosemide . -Continue taking carvedilol , olmesartan , amlodipine , and furosemide  as prescribed.  ATRIAL FIBRILLATION: Your irregular heartbeat is being managed with Eliquis . -Continue taking Eliquis  5 mg twice daily.  HYPERTENSION: Your high blood pressure is stable with your current medications. -Continue taking carvedilol , olmesartan , and amlodipine  as prescribed.  TYPE 2 DIABETES MELLITUS: Your blood sugar levels have increased slightly. -Discuss the potential reintroduction of Farxiga  with Dr. Delbert.  CHRONIC KIDNEY DISEASE, UNSPECIFIED STAGE: Your kidney function is worsening. -Start taking Kerendia  20 mg 1/2 once daily. -Check your potassium levels in one month. -Check your urine for protein.  EDEMA OF LOWER EXTREMITIES: You are experiencing swelling in your feet, possibly related to Gemtesa  use. -Monitor your swelling and report any significant changes.  URINARY INCONTINENCE: Your bladder control is being managed with Gemtesa . -Continue taking Gemtesa  as prescribed.  NAUSEA: You have been experiencing nausea, especially triggered by the smell of food. -Prescribe Zofran  for nausea.  ALLERGIC RHINITIS: You have a chronic runny nose. -Prescribe Flonase  nasal spray.  RIGHT SHIN PAIN: You have pain in your right shin, possibly due to strain. -Use Tylenol  as needed for pain. -Consider using Aspercreme for topical relief.  RECOMMEND SHINGLES VACCINE SERIES, TETANUS (tdap), AND COVID 19 (2025-2026) VACCINE AT [facility].                       Contains text generated by Abridge.                                  Contains text generated by Abridge.

## 2024-05-24 ENCOUNTER — Other Ambulatory Visit: Payer: Self-pay | Admitting: Hematology and Oncology

## 2024-05-24 DIAGNOSIS — D6859 Other primary thrombophilia: Secondary | ICD-10-CM

## 2024-05-24 DIAGNOSIS — Z7901 Long term (current) use of anticoagulants: Secondary | ICD-10-CM

## 2024-05-24 LAB — MICROALBUMIN / CREATININE URINE RATIO
Creatinine, Urine: 36.9 mg/dL
Microalb/Creat Ratio: 86 mg/g{creat} — ABNORMAL HIGH (ref 0–29)
Microalbumin, Urine: 31.7 ug/mL

## 2024-05-26 ENCOUNTER — Ambulatory Visit: Payer: Self-pay | Admitting: Family Medicine

## 2024-05-26 DIAGNOSIS — R110 Nausea: Secondary | ICD-10-CM | POA: Insufficient documentation

## 2024-05-26 DIAGNOSIS — M79661 Pain in right lower leg: Secondary | ICD-10-CM | POA: Insufficient documentation

## 2024-05-26 DIAGNOSIS — J301 Allergic rhinitis due to pollen: Secondary | ICD-10-CM | POA: Insufficient documentation

## 2024-05-26 DIAGNOSIS — R600 Localized edema: Secondary | ICD-10-CM | POA: Insufficient documentation

## 2024-05-26 NOTE — Assessment & Plan Note (Signed)
-   Use Tylenol  as needed for pain. - Consider Aspercreme for topical relief.

## 2024-05-26 NOTE — Assessment & Plan Note (Signed)
-   Start Kerendia  10 mg once daily. - Check potassium levels in one month. - Check urine for protein.

## 2024-05-26 NOTE — Assessment & Plan Note (Signed)
 Possibly related to Gemtesa  use, with evening swelling.

## 2024-05-26 NOTE — Assessment & Plan Note (Signed)
-   Discuss potential reintroduction of Farxiga  with Dr. Delbert.

## 2024-05-26 NOTE — Assessment & Plan Note (Signed)
-   Prescribe Zofran  for nausea.

## 2024-05-26 NOTE — Assessment & Plan Note (Signed)
 Managed with Gemtesa , reducing leakage but possibly causing edema.

## 2024-05-26 NOTE — Assessment & Plan Note (Signed)
 Managed with carvedilol , olmesartan , amlodipine , and Gerosamide.

## 2024-05-26 NOTE — Assessment & Plan Note (Signed)
 Managed with Eliquis  5 mg twice daily.

## 2024-05-26 NOTE — Assessment & Plan Note (Signed)
-   Prescribe Flonase  nasal spray.

## 2024-05-27 DIAGNOSIS — M25561 Pain in right knee: Secondary | ICD-10-CM | POA: Diagnosis not present

## 2024-05-29 ENCOUNTER — Telehealth: Payer: Self-pay

## 2024-05-29 ENCOUNTER — Encounter: Payer: Self-pay | Admitting: Internal Medicine

## 2024-05-29 DIAGNOSIS — R002 Palpitations: Secondary | ICD-10-CM | POA: Diagnosis not present

## 2024-05-29 MED ORDER — TOUJEO SOLOSTAR 300 UNIT/ML ~~LOC~~ SOPN: 10.0000 [IU] | PEN_INJECTOR | Freq: Every day | SUBCUTANEOUS | 3 refills | Status: DC

## 2024-05-29 NOTE — Telephone Encounter (Signed)
 Patient received a steroid shot and states that her BS have been up to the 400 range. She has been using the sliding scale but still elevated. Report has been printed for review.

## 2024-05-29 NOTE — Telephone Encounter (Signed)
 Patient has one pen of the Toujeo  left and would like new prescription sent in for more.

## 2024-05-30 ENCOUNTER — Ambulatory Visit (HOSPITAL_BASED_OUTPATIENT_CLINIC_OR_DEPARTMENT_OTHER): Admitting: Family

## 2024-06-03 DIAGNOSIS — R002 Palpitations: Secondary | ICD-10-CM

## 2024-06-04 ENCOUNTER — Ambulatory Visit: Admitting: Internal Medicine

## 2024-06-04 ENCOUNTER — Encounter: Payer: Self-pay | Admitting: Internal Medicine

## 2024-06-04 VITALS — BP 120/60 | HR 50 | Ht 61.2 in | Wt 147.0 lb

## 2024-06-04 DIAGNOSIS — R809 Proteinuria, unspecified: Secondary | ICD-10-CM | POA: Diagnosis not present

## 2024-06-04 DIAGNOSIS — E1122 Type 2 diabetes mellitus with diabetic chronic kidney disease: Secondary | ICD-10-CM | POA: Diagnosis not present

## 2024-06-04 DIAGNOSIS — Z794 Long term (current) use of insulin: Secondary | ICD-10-CM | POA: Diagnosis not present

## 2024-06-04 DIAGNOSIS — E1140 Type 2 diabetes mellitus with diabetic neuropathy, unspecified: Secondary | ICD-10-CM | POA: Diagnosis not present

## 2024-06-04 DIAGNOSIS — Z8679 Personal history of other diseases of the circulatory system: Secondary | ICD-10-CM | POA: Diagnosis not present

## 2024-06-04 DIAGNOSIS — I951 Orthostatic hypotension: Secondary | ICD-10-CM | POA: Diagnosis not present

## 2024-06-04 DIAGNOSIS — E1129 Type 2 diabetes mellitus with other diabetic kidney complication: Secondary | ICD-10-CM

## 2024-06-04 DIAGNOSIS — N1832 Chronic kidney disease, stage 3b: Secondary | ICD-10-CM

## 2024-06-04 DIAGNOSIS — R0989 Other specified symptoms and signs involving the circulatory and respiratory systems: Secondary | ICD-10-CM

## 2024-06-04 DIAGNOSIS — E1165 Type 2 diabetes mellitus with hyperglycemia: Secondary | ICD-10-CM | POA: Insufficient documentation

## 2024-06-04 DIAGNOSIS — Z87898 Personal history of other specified conditions: Secondary | ICD-10-CM

## 2024-06-04 DIAGNOSIS — R030 Elevated blood-pressure reading, without diagnosis of hypertension: Secondary | ICD-10-CM | POA: Diagnosis not present

## 2024-06-04 MED ORDER — NOVOLOG FLEXPEN 100 UNIT/ML ~~LOC~~ SOPN: PEN_INJECTOR | SUBCUTANEOUS | 3 refills | Status: AC

## 2024-06-04 MED ORDER — TOUJEO SOLOSTAR 300 UNIT/ML ~~LOC~~ SOPN: 8.0000 [IU] | PEN_INJECTOR | Freq: Every day | SUBCUTANEOUS | Status: AC

## 2024-06-04 MED ORDER — INSULIN PEN NEEDLE 32G X 4 MM MISC: 1.0000 | Freq: Four times a day (QID) | 3 refills | Status: AC

## 2024-06-04 NOTE — Progress Notes (Unsigned)
 [facility]  [address] [place],  [place]  [address] [phone]  Clinic Day:  06/04/2024  Referring physician: Sherre Clapper, MD   HISTORY OF PRESENT ILLNESS:  The patient is a 78 y.o. 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  However, the patient was switched to Eliquis  in February 2025 due to the erratic therapeutic INR levels she had while on Coumadin .     for which assesses her INR level.  She comes in today to reassess her clinical picture.  Over the past few weeks, her INR has fluctuated erratically to where her warfarin has had to be frequently adjusted.  She claims to be compliant with her daily warfarin prescription.  She denies having any new symptoms/findings which concern her for a recurrent blood clot.   PHYSICAL EXAM:  There were no vitals taken for this visit. Wt Readings from Last 3 Encounters:  06/04/24 147 lb (66.7 kg)  05/23/24 149 lb (67.6 kg)  05/10/24 146 lb 6.4 oz (66.4 kg)   There is no height or weight on file to calculate BMI. Performance status (ECOG): 1 - Symptomatic but completely ambulatory Physical Exam Constitutional:      Appearance: Normal appearance. She is not ill-appearing.  HENT:     Mouth/Throat:     Mouth: Mucous membranes are moist.     Pharynx: Oropharynx is clear. No oropharyngeal exudate or posterior oropharyngeal erythema.  Cardiovascular:     Rate and Rhythm: Normal rate and regular rhythm.     Heart sounds: No murmur heard.    No friction rub. No gallop.  Pulmonary:     Effort: Pulmonary effort is normal. No respiratory distress.     Breath sounds: Normal breath sounds. No wheezing, rhonchi or rales.  Abdominal:     General: Bowel sounds are normal. There is no distension. There are no signs of injury.     Palpations: Abdomen is soft. There is no mass.  Musculoskeletal:        General: No swelling.     Right lower leg: No edema.     Left  lower leg: No edema.  Lymphadenopathy:     Cervical: No cervical adenopathy.     Upper Body:     Right upper body: No supraclavicular or axillary adenopathy.     Left upper body: No supraclavicular or axillary adenopathy.     Lower Body: No right inguinal adenopathy. No left inguinal adenopathy.  Skin:    General: Skin is warm.     Coloration: Skin is not jaundiced.     Findings: No lesion or rash.  Neurological:     General: No focal deficit present.     Mental Status: She is alert and oriented to person, place, and time. Mental status is at baseline.  Psychiatric:        Mood and Affect: Mood normal.        Behavior: Behavior normal.        Thought Content: Thought content normal.    LABS:      Latest Ref Rng & Units 05/22/2024    8:04 AM 03/19/2024   10:57 AM 02/12/2024   12:24 PM  CBC  WBC 3.4 - 10.8 x10E3/uL 8.1  8.1  5.6   Hemoglobin 11.1 - 15.9 g/dL 88.0  88.1  88.6   Hematocrit 34.0 - 46.6 % 37.1  37.1  35.4   Platelets 150 - 450 x10E3/uL 284  251  243       Latest Ref Rng & Units 05/22/2024    8:04 AM 04/15/2024    3:36 PM 04/08/2024    3:48 PM  CMP  Glucose 70 - 99 mg/dL 812  853  800   BUN 8 - 27 mg/dL 26  37  36   Creatinine 0.57 - 1.00 mg/dL 8.75  9.01  9.00   Sodium 134 - 144 mmol/L 136  138  131   Potassium 3.5 - 5.2 mmol/L 4.4  4.4  4.6   Chloride 96 - 106 mmol/L 98  100  95   CO2 20 - 29 mmol/L 24  27  23    Calcium  8.7 - 10.3 mg/dL 9.3  9.0  9.4   Total Protein 6.0 - 8.5 g/dL 6.0  6.0    Total Bilirubin 0.0 - 1.2 mg/dL 0.4  0.3    Alkaline Phos 49 - 135 IU/L 126  117    AST 0 - 40 IU/L 26  28    ALT 0 - 32 IU/L 27  26      Latest Reference Range & Units 06/06/23 09:41  Iron 28 - 170 ug/dL 78  UIBC ug/dL 723  TIBC 749 - 549 ug/dL 645  Saturation Ratios 10.4 - 31.8 % 22  Ferritin 11 - 307 ng/mL 39    ASSESSMENT & PLAN:  Assessment/Plan:  A 78 y.o. female with protein S deficiency, as well as a history of mild anemia.  Overall, the patient appears  to be doing well.  I once again spoke with the patient about considering a direct oral anticoagulant (DOAC) (ie.  Xarelto, Eliquis ) for her protein S management, particularly as such agents lead to auto-anticoagulation without the need for frequent INR checks.  The patient was told to think about the switch in therapy when considering how low a precision her INR results have been over these past few weeks.  If she is willing to switch to a DOAC, our office would help facilitate the switch.  She also has a history of anemia, for which her iron and hemoglobin levels remain fine.  As she is doing well, I will see her back in 1 year for repeat clinical assessment.  The patient understands all the plans discussed today and is in agreement with them.    Amante Fomby DELENA Kerns, MD

## 2024-06-04 NOTE — Patient Instructions (Signed)
 STOP Trulicity  0.75 mg ONCE weekly  Take Toujeo  8 units daily  Take  Novolog  6 units with a regular meal and 4 units with a small meal Correction Novolog : ADD extra units on insulin  to your meal-time Novolog  dose if your blood sugars are higher than 185. Use the scale below to help guide you:   Blood sugar before meal Number of units to inject  Less than 185 0 unit  186 - 240 1 units  241 - 295 2 units  296 - 350 3 units  351 - 405 4 units  406 - 460 5 units      HOW TO TREAT LOW BLOOD SUGARS (Blood sugar LESS THAN 70 MG/DL) Please follow the RULE OF 15 for the treatment of hypoglycemia treatment (when your (blood sugars are less than 70 mg/dL)   STEP 1: Take 15 grams of carbohydrates when your blood sugar is low, which includes:  3-4 GLUCOSE TABS  OR 3-4 OZ OF JUICE OR REGULAR SODA OR ONE TUBE OF GLUCOSE GEL    STEP 2: RECHECK blood sugar in 15 MINUTES STEP 3: If your blood sugar is still low at the 15 minute recheck --> then, go back to STEP 1 and treat AGAIN with another 15 grams of carbohydrates.

## 2024-06-04 NOTE — Progress Notes (Signed)
 Name: Pamela [name]  MRN/ DOB: [id number], 07-07-1946   Age/ Sex: 78 y.o., female    PCP: Sherre Clapper, MD   Reason for Endocrinology Evaluation: Type 2 Diabetes Mellitus     Date of Initial Endocrinology Visit: 03/30/2022    PATIENT IDENTIFIER: Pamela [name] is a 78 y.o. female with a past medical history of T2DM, GERD, A. Fib . The patient presented for initial endocrinology clinic visit on 06/04/2024 for consultative assistance with her diabetes management.    HPI: Pamela [name] was    Diagnosed with DM in 1985 Prior Medications tried/Intolerance: Metformin - tired .Ozempic  - diarrhea , Rybelsus  caused nausea  Hemoglobin A1c has ranged from 7.2% in 2023, peaking at 9.8% in 2022.   Of note, the pt was seen by Dr. Kassie in 2021 for hyponatremia . Adrenal insufficiency was excluded    She was started on Farxiga  in July 2023 Started Trulicity  05/2023  I discontinued Toujeo  09/2023 as she was on a small dose of 6 units and was having hypoglycemia overnight  Farxiga  was discontinued by the hospital 2025   Toujeo  was restarted in September, 2025 when the patient was put on glucocorticoid therapy which resulted in hyperglycemia  SUBJECTIVE:   During the last visit (02/02/2024): A1c 6.7%    Today (06/04/24): Pamela [name] is here for a follow up on diabetes management. She checks her blood sugars multiple times daily through CGM. 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 Dolan (nephrology) She follows with cardiology for CAD, A-fib, and CHF  Received right knee intra-articular injection in 9/23rd She stopped toujeo  due to hypoglycemia  She does have nausea and vomiting on average twice a week Has loose stools  Patient has been noted weight loss since her last visit here Denies recent  nausea Has alternating constipation  and diarrhea  Has heartburn     HOME DIABETES REGIMEN: Trulicity  0.75 mg weekly Novolog  8 units TIDQAC Correction factor: NovoLog  (BG -120/50) Toujeo  10 units daily- not taking     Statin: yes ACE-I/ARB: yes    CONTINUOUS GLUCOSE MONITORING RECORD INTERPRETATION    Dates of Recording: 9/17-9/30/2025  Sensor description: Freestyle libre 3  Results statistics:   CGM use % of time 95  Average and SD 215/43.9  Time in range 46 %  % Time Above 180 26  % Time above 250 27  % Time Below target 1   Glycemic patterns summary: BGs have been noted to be elevated overnight and fluctuate during the day Hyperglycemic episodes post prandial  Hypoglycemic episodes occurred after bolus  Overnight periods: High    DIABETIC COMPLICATIONS: Microvascular complications:  Macular degeneration , CKD III Denies:  Last eye exam: Completed 09/05/2023  Macrovascular complications:   Denies: CAD, PVD, CVA   PAST HISTORY: Past Medical History:  Past Medical History:  Diagnosis Date   Abnormal CT of the chest 07/20/2023   Acquired thrombophilia 06/27/2021   Acute combined systolic and diastolic heart failure (HCC) 12/08/2023   Acute cystitis without hematuria 06/12/2022   Acute heart failure with mildly reduced ejection fraction (HFmrEF, 41-49%) (HCC) 12/07/2023   Acute LUQ pain 02/12/2024   Acute on chronic diastolic (congestive) heart failure (HCC) 12/07/2023   Anemia in chronic kidney disease 09/06/2023   Anticoagulated on Coumadin     chronic--- managed by hematology/ oncology,  Asthma    Asthma, mild intermittent    followed by pcp--- per pt last exacerbation winter 2021 w/ acute bronchitis   Athscl heart disease of native coronary artery w/o ang pctrs 09/06/2023   Atrial fibrillation [I48.91] 10/01/2014   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, NYHA class 1 (HCC) 05/23/2011   Bilateral leg cramps    Blood dyscrasia     Bunion 08/23/2021   Cervical radiculopathy 07/19/2021   Cervical spondylosis 09/06/2023   Chest pain at rest 01/22/2024   Chronic constipation    Chronic diastolic congestive heart failure (HCC) 12/09/2020   Chronic pain of left ankle 03/31/2024   CKD (chronic kidney disease), stage III (HCC)    CKD stage 3a, GFR 45-59 ml/min (HCC) 11/05/2023   Closed bimalleolar fracture of left ankle 02/26/2020   Closed fracture of fifth metatarsal bone of left foot 12/06/2021   Closed fracture of fourth metatarsal bone 05/07/2021   Compression fracture of L1 lumbar vertebra (HCC) 12/17/2022   DDD (degenerative disc disease), cervical    w/ spondylosis,  per pt last steroid injection 06/ 2022   DDD (degenerative disc disease), lumbosacral    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   DJD (degenerative joint disease)    DVT (deep venous thrombosis) (HCC) 07/30/2013   Dyspnea    occasionally   Dyspnea on exertion 03/03/2013   Dysrhythmia    Dysuria 04/17/2023   Elevated rheumatoid factor 05/03/2024   Encntr for surgical aftcr following surgery on the circ sys 09/06/2023   Encounter for immunization 07/20/2023   Encounter for Medicare annual wellness exam 04/04/2024   Encounter for orthopedic follow-up care 03/10/2020   Flank pain 02/12/2024   GERD (gastroesophageal reflux disease)    Hammer toe 08/23/2021   Heart murmur    as a child     History of cardiac murmur as a child    History of DVT of lower extremity    left lower extremity in 1980s, fell when bowling   History of falling 09/06/2023   History of pulmonary embolus (PE) 1993   per pt left lung post op 2 wks cholecystectomy   History of rheumatic fever as a child    per last echo 12-31-2019 no valvular issues   History of TIA (transient ischemic attack)    2014 and 2018 or 2019,  per pt no residual   HTN (hypertension)    followed by pcp   Hyperkalemia 03/31/2024   Hyperlipidemia    Hypertensive heart and chronic kidney  disease with heart failure and stage 1 through stage 4 chronic kidney disease, or unspecified chronic kidney disease (HCC) 12/12/2023   Hyponatremia 02/06/2019   Na 126 on no obvious meds to cause it with last na 133 in 2014      Hypothyroidism    IDA (iron deficiency anemia)    Joint pain 10/24/2021   Low back strain 08/09/2018   Lumbar radiculopathy 08/13/2021   Macular degeneration of both eyes    Mild obstructive sleep apnea    per pt dx 2017 tried to uses cpap but intolerant   Mixed incontinence urge and stress    urologist--- dr marda   Muscle pain 10/24/2021   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   OA (osteoarthritis) 07/06/2018   Osteoporosis    Other cardiomyopathies (HCC) 09/06/2023   Other instability, left foot 03/28/2024   Other microscopic hematuria 03/31/2024   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   PAF (paroxysmal  atrial fibrillation) (HCC) 10/01/2014   cardiologist--- dr dub tobb;   cardiac cath 02-18-2013 normal coronaries arteries, ef 50%, cath done since echo showed ef 30-35%; nucleat stress study 04/ 2020 normal , normal echo 04/ 2021,  event monitor 09-14-2020 rare ST/AT variable block   Pain in left foot 12/17/2021   Pain of right hip joint 08/11/2022   Personal history of pneumonia (recurrent) 09/06/2023   Personal history of urinary (tract) infections 09/06/2023   Pneumonia    PONV (postoperative nausea and vomiting)    Presence of left artificial knee joint 09/06/2023   Protein S deficiency    followed by hemotology/ oncology-- dr d. ezzard ([facility]) dx 1980s;  prior DVT left lower leg 1980s and left lung PE 1993; chronic  coumadin  since 1980s   PVC's (premature ventricular contractions)    followed by cardiology   S/P cardiac catheterization 02/2013   Normal coronaries; low normal EF at 50%   Skin cancer screening 02/12/2024   Solitary pulmonary nodule 09/06/2023    Solitary pulmonary nodule on lung CT 02/06/2019   First noted 01/13/2014 > no change as of 12/21/2018   Spinal stenosis of lumbar region 07/19/2021   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/22/2022   Spondylolisthesis, lumbar region 08/09/2018   Sprain of right ankle 10/13/2023   Stage 3b chronic kidney disease (HCC) 12/17/2022   Stroke (HCC)    TIA in 2018 or 2019   Syncope 01/22/2024   Transient ischemic attack 07/30/2013   Traumatic arthritis of left ankle 02/12/2024   Type 2 diabetes mellitus treated with insulin  (HCC)    endocrilogist--- whitney reardon NP     (03-10-2021  pt continuously checks blood sugar throughout the day w/ Libre, fasting sugar --- 69--200)   Type 2 diabetes mellitus with diabetic neuropathy, with long-term current use of insulin  (HCC) 06/15/2021   Unilateral primary osteoarthritis, right knee 07/06/2018   Urinary frequency 02/12/2024   UTI (urinary tract infection) 01/23/2024   UTI (urinary tract infection) due to Enterococcus 01/22/2024   Ventricular premature depolarization 09/06/2023   Wears glasses    Wears hearing aid in both ears    Past Surgical History:  Past Surgical History:  Procedure Laterality Date   ABDOMINAL HYSTERECTOMY     BLEPHAROPLASTY     CARDIAC CATHETERIZATION  02/18/2013   @ [facility]  by Dr Swaz[place]and;  normal coronaries w/ preserved LVF, ef 50%;   previous cath 2001 normal ef 65%   CARPAL TUNNEL RELEASE Bilateral 1994   CATARACT EXTRACTION W/ INTRAOCULAR LENS IMPLANT Bilateral 2017   CHOLECYSTECTOMY     CHOLECYSTECTOMY, LAPAROSCOPIC  1993   COLONOSCOPY     EAR BIOPSY Left    FINGER SURGERY Left 2018   thumb   FOOT SURGERY     FOOT TENDON SURGERY Right    early 2000s   FRACTURE SURGERY Left 02/13/2020   left femur   HAND SURGERY Right 04/2022   HARDWARE REMOVAL Left 03/12/2021   Procedure: HARDWARE REMOVAL;  Surgeon: Sharl Selinda Dover, MD;  Location: [facility];  Service: Orthopedics;   Laterality: Left;  60 MINS   JOINT REPLACEMENT     LEFT HEART CATH AND CORONARY ANGIOGRAPHY N/A 12/07/2023   Procedure: LEFT HEART CATH AND CORONARY ANGIOGRAPHY;  Surgeon: Verlin Lonni BIRCH, MD;  Location: [facility] INVASIVE CV LAB;  Service: Cardiovascular;  Laterality: N/A;   LUMBAR LAMINECTOMY/DECOMPRESSION MICRODISCECTOMY N/A 12/22/2022   Procedure: Lumbar Two-Lumbar Three, Lumbar Three-Lumbar Four Lumbar  Decompression;  Surgeon: Colon Shove, MD;  Location: [facility] OR;  Service: Neurosurgery;  Laterality: N/A;  RM 20 3C   ORIF ANKLE FRACTURE Left 02/26/2020   Procedure: OPEN REDUCTION INTERNAL FIXATION (ORIF) ANKLE FRACTURE;  Surgeon: Sharl Selinda Dover, MD;  Location: [facility] OR;  Service: Orthopedics;  Laterality: Left;  90 mins   TONSILLECTOMY AND ADENOIDECTOMY Bilateral    TOTAL KNEE ARTHROPLASTY Left 03/2005   TOTAL VAGINAL HYSTERECTOMY  1988   per pt still has ovaries    Social History:  reports that she has never smoked. She has never used smokeless tobacco. She reports that she does not drink alcohol and does not use drugs. Family History:  Family History  Problem Relation Age of Onset   Cirrhosis Mother    Antithrombin III deficiency Mother        multiple emboli   Ulcerative colitis Mother    Diabetes Father    Coronary artery disease Father    Heart attack Father    Hypertension Father    Kidney disease Father    Hypertension Sister    Diabetes Sister    Heart attack Sister        age 66    Protein S deficiency Daughter    Heart attack Brother    Hypertension Brother    Lung cancer Brother    Stroke Neg Hx    Colitis Neg Hx    Colon polyps Neg Hx    Esophageal cancer Neg Hx    Liver cancer Neg Hx    Pancreatic cancer Neg Hx    Rectal cancer Neg Hx    Stomach cancer Neg Hx    Breast cancer Neg Hx      HOME MEDICATIONS: Allergies as of 06/04/2024       Reactions   Loxapine Succinate Hives   Macrodantin  [nitrofurantoin  Macrocrystal] Other (See Comments)    Pulmonary fibrosis   Naproxen Sodium Anaphylaxis, Hives   Telithromycin Nausea And Vomiting, Rash   Other Reaction(s): Unknown   Amoxapine And Related Hives   Darvon Nausea And Vomiting   Levothyroxine  Sodium Photosensitivity, Other (See Comments)   Blurry and double vision   Belsomra [suvorexant] Other (See Comments)   Nightmares   Cymbalta [duloxetine Hcl] Other (See Comments)   Blackouts   Duloxetine    Gabapentin  Other (See Comments)   Blurry vision   Jardiance [empagliflozin]    UTIs   Lyrica  [pregabalin ]    Makes her sedated   Naproxen Hives   Other Reaction(s): hives and GI upset   Ozempic  (0.25 Or 0.5 Mg-dose) [semaglutide (0.25 Or 0.5mg -dos)] Diarrhea   Semaglutide  Diarrhea   Rybelsus    Amoxicillin  Rash   Other Reaction(s): hives and GI upset   Propoxyphene Nausea And Vomiting   Other Reaction(s): hives and GI upset        Medication List        Accurate as of June 04, 2024  9:09 AM. If you have any questions, ask your nurse or doctor.          Accu-Chek Guide Test test strip Generic drug: glucose blood Use to check blood sugar up to 4 times per day   acetaminophen  650 MG CR tablet Commonly known as: TYLENOL  Take 650-1,300 mg by mouth every 8 (eight) hours as needed for pain.   amLODipine  10 MG tablet Commonly known as: NORVASC  Take 10 mg by mouth daily.   aspirin  EC 81 MG tablet Commonly known as: Aspirin  Low Dose Take 1 tablet (81  mg total) by mouth daily. Swallow whole.   atorvastatin  80 MG tablet Commonly known as: LIPITOR  TAKE 1 TABLET(80 MG) BY MOUTH DAILY   blood glucose meter kit and supplies Kit Dispense based on patient and insurance preference. Use up to four times daily as directed.   carboxymethylcellulose 0.5 % Soln Commonly known as: REFRESH PLUS Place 1 drop into both eyes 3 (three) times daily as needed (dry eyes).   carvedilol  12.5 MG tablet Commonly known as: COREG  TAKE 1 TABLET(12.5 MG) BY MOUTH TWICE DAILY  WITH A MEAL   cyanocobalamin  1000 MCG tablet Commonly known as: VITAMIN B12 Take 1,000 mcg by mouth daily with lunch.   Eliquis  5 MG Tabs tablet Generic drug: apixaban  TAKE 1 TABLET(5 MG) BY MOUTH TWICE DAILY   ferrous sulfate  325 (65 FE) MG tablet Take 325 mg by mouth daily with lunch.   fluticasone  50 MCG/ACT nasal spray Commonly known as: FLONASE  Place 2 sprays into both nostrils daily.   furosemide  20 MG tablet Commonly known as: LASIX  Take 1 tablet (20 mg total) by mouth daily. TAKE 1 TABLET(20 MG) BY MOUTH DAILY   Gemtesa  75 MG Tabs Generic drug: Vibegron  Take 1 tablet (75 mg total) by mouth at bedtime.   Kerendia  10 MG Tabs Generic drug: Finerenone  Take 1 tablet (10 mg total) by mouth daily.   magnesium  oxide 400 (240 Mg) MG tablet Commonly known as: MAG-OX TAKE 2 TABLETS(800 MG) BY MOUTH TWICE DAILY   methenamine  1 g tablet Commonly known as: HIPREX  Take 1 tablet (1 g total) by mouth 2 (two) times daily with a meal.   NovoLOG  FlexPen 100 UNIT/ML FlexPen Generic drug: insulin  aspart Max daily 30 units   olmesartan  40 MG tablet Commonly known as: BENICAR  Take 1 tablet (40 mg total) by mouth daily.   ondansetron  8 MG disintegrating tablet Commonly known as: ZOFRAN -ODT Take 1 tablet (8 mg total) by mouth every 8 (eight) hours as needed for nausea or vomiting.   pantoprazole  40 MG tablet Commonly known as: PROTONIX  TAKE 1 TABLET(40 MG) BY MOUTH EVERY MORNING   pyridoxine  100 MG tablet Commonly known as: B-6 Take 100 mg by mouth daily with lunch.   sennosides-docusate sodium  8.6-50 MG tablet Commonly known as: SENOKOT-S Take 1 tablet by mouth every other day.   sodium chloride  0.65 % Soln nasal spray Commonly known as: OCEAN Place 1 spray into both nostrils as needed for congestion.   Toujeo  SoloStar 300 UNIT/ML Solostar Pen Generic drug: insulin  glargine (1 Unit Dial ) Inject 10 Units into the skin daily in the afternoon.   Trulicity  0.75  MG/0.5ML Soaj Generic drug: Dulaglutide  Inject 0.75 mg into the skin once a week.   Vitamin D3 50 MCG (2000 UT) Tabs Take 4,000 Units by mouth in the morning and at bedtime.   zolpidem  5 MG tablet Commonly known as: AMBIEN  TAKE 1 TABLET(5 MG) BY MOUTH AT BEDTIME AS NEEDED FOR SLEEP         ALLERGIES: Allergies  Allergen Reactions   Loxapine Succinate Hives   Macrodantin  [Nitrofurantoin  Macrocrystal] Other (See Comments)    Pulmonary fibrosis   Naproxen Sodium Anaphylaxis and Hives   Telithromycin Nausea And Vomiting and Rash    Other Reaction(s): Unknown   Amoxapine And Related Hives   Darvon Nausea And Vomiting   Levothyroxine  Sodium Photosensitivity and Other (See Comments)    Blurry and double vision   Belsomra [Suvorexant] Other (See Comments)    Nightmares   Cymbalta [Duloxetine Hcl] Other (  See Comments)    Blackouts   Duloxetine    Gabapentin  Other (See Comments)    Blurry vision   Jardiance [Empagliflozin]     UTIs   Lyrica  [Pregabalin ]     Makes her sedated   Naproxen Hives    Other Reaction(s): hives and GI upset   Ozempic  (0.25 Or 0.5 Mg-Dose) [Semaglutide (0.25 Or 0.5mg -Dos)] Diarrhea   Semaglutide  Diarrhea    Rybelsus    Amoxicillin  Rash    Other Reaction(s): hives and GI upset   Propoxyphene Nausea And Vomiting    Other Reaction(s): hives and GI upset     REVIEW OF SYSTEMS: A comprehensive ROS was conducted with the patient and is negative except as per HPI     OBJECTIVE:   VITAL SIGNS: BP 120/60 (BP Location: Left Arm, Patient Position: Sitting, Cuff Size: Large)   Pulse (!) 50   Ht 5' 1.2 (1.554 m)   Wt 147 lb (66.7 kg)   SpO2 96%   BMI 27.59 kg/m    PHYSICAL EXAM:  General: Pt appears well and is in NAD  Lungs: Clear with good BS bilat   Heart: RRR   Extremities:  Lower extremities - No pretibial edema.   Neuro: MS is good with appropriate affect, pt is alert and Ox3    DM Foot Exam 05/23/2024 through PCP's  office    DATA REVIEWED:  Lab Results  Component Value Date   HGBA1C 7.6 (H) 05/22/2024   HGBA1C 7.3 (H) 01/31/2024   HGBA1C 7.1 (H) 10/24/2023    Latest Reference Range & Units 05/22/24 08:04  Sodium 134 - 144 mmol/L 136  Potassium 3.5 - 5.2 mmol/L 4.4  Chloride 96 - 106 mmol/L 98  CO2 20 - 29 mmol/L 24  Glucose 70 - 99 mg/dL 812 (H)  BUN 8 - 27 mg/dL 26  Creatinine 9.42 - 8.99 mg/dL 8.75 (H)  Calcium  8.7 - 10.3 mg/dL 9.3  BUN/Creatinine Ratio 12 - 28  21  eGFR >59 mL/min/1.73 44 (L)  Alkaline Phosphatase 49 - 135 IU/L 126  Albumin  3.8 - 4.8 g/dL 3.8  AST 0 - 40 IU/L 26  ALT 0 - 32 IU/L 27  Total Protein 6.0 - 8.5 g/dL 6.0  Total Bilirubin 0.0 - 1.2 mg/dL 0.4    Latest Reference Range & Units 05/22/24 08:04  Total CHOL/HDL Ratio 0.0 - 4.4 ratio 2.2  Cholesterol, Total 100 - 199 mg/dL 889  HDL Cholesterol >60 mg/dL 49  Triglycerides 0 - 850 mg/dL 52  VLDL Cholesterol Cal 5 - 40 mg/dL 12  LDL Chol Calc (NIH) 0 - 99 mg/dL 49    ASSESSMENT / PLAN / RECOMMENDATIONS:   1) Type 2 Diabetes Mellitus,Sub- Optimally controlled, With CKD III complications - Most recent A1c of 7.6 %. Goal A1c < 7.5 %.    -A1c continues to trend up -She has reported intolerance to Ozempic  and Rybelsus  -We had discontinued Toujeo  due to overnight hypoglycemia on just 6 units 07/2023, but was restarted in September, 2025 due to persistent hyperglycemia after receiving intra-articular glucocorticoid injection, but the patient discontinued this 2 days ago due to hypoglycemia -In reviewing CGM download, the patient has been noted with hypoglycemic episode after lunch, the patient did take 8 units of NovoLog  but ate a smaller meal than usual, I did explain to the patient that her hypoglycemia was due to NovoLog  and insulin -carbohydrate mismatch and not due to her basal insulin  -I did encourage the patient to restart Toujeo  as below - The  patient has been feeling dizzy due to multiple diuretic agent use,  Farxiga  was discontinued during hospitalization 2025 due to syncope -She is not interested in insulin  pump technology - I did advise the patient to take NovoLog  6 units with a regular meal but if she is going to eat a smaller meal than usual she needs to decrease the NovoLog  to prevent hypoglycemia - I will discontinue Trulicity  as she continues to complain of heartburn, nausea and vomiting  MEDICATIONS: Stop Trulicity  0.75 mg weekly Take Toujeo  8 units daily Take NovoLog  6 units with a regular meal, and 4 units with a smaller meal Continue correction factor : NovoLog  (BG -130/55)  EDUCATION / INSTRUCTIONS: BG monitoring instructions: Patient is instructed to check her blood sugars 3 times a day, before meals. Call [facility] if: BG persistently < 70  I reviewed the Rule of 15 for the treatment of hypoglycemia in detail with the patient. Literature supplied.   2) Diabetic complications:  Eye: Does not have known diabetic retinopathy.  Neuro/ Feet: Does not have known diabetic peripheral neuropathy. Renal: Patient does  have known baseline CKD. She is  on an ACEI/ARB at present.  3) Microalbuminuria :  - I explained to the patient this is due to uncontrolled DM -Encouraged glucose control -Patient on Benicar     Follow-up in 4 months     Signed electronically by: Stefano Redgie Butts, MD  [facility]  [facility] [address] [place], [place] [address] Phone: [phone] FAX: [phone]   CC: Sherre Clapper, MD [address] [place] [address] Phone: [phone]  Fax: [phone]    Return to Endocrinology clinic as below: Future Appointments  Date Time Provider Department Center  06/04/2024  9:10 AM Makella Buckingham, Donell Redgie, MD [facility] None  06/05/2024  9:30 AM [facility] None  06/05/2024 10:00 AM Ezzard Valaria LABOR, MD [facility] None  06/12/2024  1:00 PM Corey Rufus HERO, MD  [facility] None  06/17/2024 10:40 AM Bernie Lamar PARAS, MD [facility] None  06/19/2024  9:30 AM [facility] SCHEDULE [facility] [place]  07/04/2024 10:05 AM Vannie Reche RAMAN, NP [facility] [address]  08/26/2024  8:00 AM [facility] SCHEDULE [facility] [place]  08/30/2024  9:00 AM Sherre Clapper, MD [facility]   09/09/2024 10:00 AM Matilda Senior, MD [facility] None

## 2024-06-05 ENCOUNTER — Inpatient Hospital Stay: Payer: Medicare PPO

## 2024-06-05 ENCOUNTER — Inpatient Hospital Stay: Payer: Medicare PPO | Attending: Oncology | Admitting: Oncology

## 2024-06-05 ENCOUNTER — Other Ambulatory Visit: Payer: Self-pay

## 2024-06-05 ENCOUNTER — Ambulatory Visit: Payer: Self-pay | Admitting: Cardiology

## 2024-06-05 ENCOUNTER — Telehealth: Payer: Self-pay | Admitting: Oncology

## 2024-06-05 VITALS — BP 146/54 | HR 59 | Temp 97.5°F | Resp 16 | Ht 61.2 in | Wt 144.3 lb

## 2024-06-05 DIAGNOSIS — D6859 Other primary thrombophilia: Secondary | ICD-10-CM | POA: Diagnosis not present

## 2024-06-05 DIAGNOSIS — I499 Cardiac arrhythmia, unspecified: Secondary | ICD-10-CM | POA: Insufficient documentation

## 2024-06-05 DIAGNOSIS — Z7901 Long term (current) use of anticoagulants: Secondary | ICD-10-CM | POA: Diagnosis not present

## 2024-06-05 LAB — CMP (CANCER CENTER ONLY)
ALT: 47 U/L — ABNORMAL HIGH (ref 0–44)
AST: 38 U/L (ref 15–41)
Albumin: 3.6 g/dL (ref 3.5–5.0)
Alkaline Phosphatase: 114 U/L (ref 38–126)
Anion gap: 11 (ref 5–15)
BUN: 33 mg/dL — ABNORMAL HIGH (ref 8–23)
CO2: 27 mmol/L (ref 22–32)
Calcium: 9.1 mg/dL (ref 8.9–10.3)
Chloride: 98 mmol/L (ref 98–111)
Creatinine: 1.17 mg/dL — ABNORMAL HIGH (ref 0.44–1.00)
GFR, Estimated: 47 mL/min — ABNORMAL LOW (ref >60)
Glucose, Bld: 127 mg/dL — ABNORMAL HIGH (ref 70–99)
Potassium: 4.8 mmol/L (ref 3.5–5.1)
Sodium: 135 mmol/L (ref 135–145)
Total Bilirubin: 0.5 mg/dL (ref 0.0–1.2)
Total Protein: 6.5 g/dL (ref 6.5–8.1)

## 2024-06-05 LAB — CBC WITH DIFFERENTIAL (CANCER CENTER ONLY)
Abs Immature Granulocytes: 0.06 K/uL (ref 0.00–0.07)
Basophils Absolute: 0.1 K/uL (ref 0.0–0.1)
Basophils Relative: 1 %
Eosinophils Absolute: 0.5 K/uL (ref 0.0–0.5)
Eosinophils Relative: 4 %
HCT: 36.5 % (ref 36.0–46.0)
Hemoglobin: 12.1 g/dL (ref 12.0–15.0)
Immature Granulocytes: 1 %
Lymphocytes Relative: 14 %
Lymphs Abs: 1.4 K/uL (ref 0.7–4.0)
MCH: 30.2 pg (ref 26.0–34.0)
MCHC: 33.2 g/dL (ref 30.0–36.0)
MCV: 91 fL (ref 80.0–100.0)
Monocytes Absolute: 1.3 K/uL — ABNORMAL HIGH (ref 0.1–1.0)
Monocytes Relative: 12 %
Neutro Abs: 7.2 K/uL (ref 1.7–7.7)
Neutrophils Relative %: 68 %
Platelet Count: 282 K/uL (ref 150–400)
RBC: 4.01 MIL/uL (ref 3.87–5.11)
RDW: 13.8 % (ref 11.5–15.5)
WBC Count: 10.4 K/uL (ref 4.0–10.5)
nRBC: 0 % (ref 0.0–0.2)

## 2024-06-05 LAB — IRON AND TIBC
Iron: 68 ug/dL (ref 28–170)
Saturation Ratios: 21 % (ref 10.4–31.8)
TIBC: 319 ug/dL (ref 250–450)
UIBC: 252 ug/dL

## 2024-06-05 LAB — APTT: aPTT: 30 s (ref 24–36)

## 2024-06-05 LAB — PROTIME-INR
INR: 1.3 — ABNORMAL HIGH (ref 0.8–1.2)
Prothrombin Time: 16.8 s — ABNORMAL HIGH (ref 11.4–15.2)

## 2024-06-05 LAB — FERRITIN: Ferritin: 105 ng/mL (ref 11–307)

## 2024-06-05 NOTE — Telephone Encounter (Signed)
 Patient has been scheduled for follow-up visit per 06/05/24 LOS.  Pt aware of scheduled appt details.

## 2024-06-06 ENCOUNTER — Telehealth: Payer: Self-pay | Admitting: Family Medicine

## 2024-06-06 NOTE — Telephone Encounter (Unsigned)
 Copied from CRM #[id number]. Topic: Clinical - Medication Refill >> Jun 06, 2024 10:04 AM Vanessa G wrote: Medication: Finerenone  (KERENDIA ) 10 MG TABS  Has the patient contacted their pharmacy? No (Agent: If no, request that the patient contact the pharmacy for the refill. If patient does not wish to contact the pharmacy document the reason why and proceed with request.) (Agent: If yes, when and what did the pharmacy advise?)  This is the patient's preferred pharmacy:  [id number] [place], [place] - [address] AT [facility] [address] [place] [address] Phone: [phone] Fax: [phone]  Is this the correct pharmacy for this prescription? Yes If no, delete pharmacy and type the correct one.   Has the prescription been filled recently? Yes  Is the patient out of the medication? No  Has the patient been seen for an appointment in the last year OR does the patient have an upcoming appointment? Yes  Can we respond through MyChart? No  Agent: Please be advised that Rx refills may take up to 3 business days. We ask that you follow-up with your pharmacy.

## 2024-06-08 DIAGNOSIS — E1122 Type 2 diabetes mellitus with diabetic chronic kidney disease: Secondary | ICD-10-CM | POA: Diagnosis not present

## 2024-06-10 ENCOUNTER — Telehealth: Payer: Self-pay

## 2024-06-10 DIAGNOSIS — I472 Ventricular tachycardia, unspecified: Secondary | ICD-10-CM

## 2024-06-10 NOTE — Telephone Encounter (Signed)
 Spoke with pt regarding Monitor results. Per Dr. Karry note referral made to Dr. Inocencio. Pt verbalized understanding and had no further questions. Results routed to PCP.

## 2024-06-12 ENCOUNTER — Ambulatory Visit (INDEPENDENT_AMBULATORY_CARE_PROVIDER_SITE_OTHER): Admitting: Dermatology

## 2024-06-12 ENCOUNTER — Encounter: Payer: Self-pay | Admitting: Dermatology

## 2024-06-12 DIAGNOSIS — W908XXA Exposure to other nonionizing radiation, initial encounter: Secondary | ICD-10-CM | POA: Diagnosis not present

## 2024-06-12 DIAGNOSIS — Z8619 Personal history of other infectious and parasitic diseases: Secondary | ICD-10-CM

## 2024-06-12 DIAGNOSIS — L570 Actinic keratosis: Secondary | ICD-10-CM

## 2024-06-12 DIAGNOSIS — B001 Herpesviral vesicular dermatitis: Secondary | ICD-10-CM

## 2024-06-12 MED ORDER — VALACYCLOVIR HCL 500 MG PO TABS
500.0000 mg | ORAL_TABLET | Freq: Every day | ORAL | 0 refills | Status: AC
Start: 2024-06-12 — End: 2024-06-26

## 2024-06-12 MED ORDER — FLUOROURACIL 5 % EX CREA: TOPICAL_CREAM | Freq: Two times a day (BID) | CUTANEOUS | 1 refills | Status: DC

## 2024-06-12 NOTE — Patient Instructions (Addendum)
Efudex Instructions 1. Apply a thin layer of Efudex cream to the specified sun  damaged area twice a day. The medication is to be applied to the entire  area, not just the keratoses (rough spots). Wash hands thoroughly after  application! 2. Use Efudex twice daily for 2 weeks to 4 weeks (depending on the  physician directions) or once a day for 4 weeks (depending on the  physician directions).  3. The skin will become red and scaly. This is the appropriate response.  4. Once redness starts, you may begin using Hydrocortisone 1% Cream (or  other topical steroid provided by the physician) twice a day. This helps  relieve pain and will speed the healing process. It may be applied as soon  as 30 minutes after Efudex application. 5. For excessive reactions or if suspicious for an infection or allergic  reaction, please call to make an appointment at my office so that I may  evaluate your response to treatment. 6. The area may be washed as you normally would with soap and water. Do  not wash the face for at least three hours after Efudex  application. 7. Ladies: Chrystie Nose is permitted throughout the entire treatment period. Wait  30 minutes after Efudex use before applying makeup directly on top of the  medication. 8. Please read the instructional pamphlet provided by the [employer] so that you may familiarize yourself with the kinds of reactions  you may experience. 9. If you have had a biopsy on any area that you will use the medication on,  DO NOT start the Efudex until your results are given to you and  the medical assistant lets you know it is okay to start the treatment   Important Information   Due to recent changes in healthcare laws, you may see results of your pathology and/or laboratory studies on MyChart before the doctors have had a chance to review them. We understand that in some cases there may be results that are confusing or concerning to you. Please understand that  not all results are received at the same time and often the doctors may need to interpret multiple results in order to provide you with the best plan of care or course of treatment. Therefore, we ask that you please give [place] 2 business days to thoroughly review all your results before contacting the office for clarification. Should we see a critical lab result, you will be contacted sooner.     If You Need Anything After Your Visit   If you have any questions or concerns for your doctor, please call our main line at [phone]. If no one answers, please leave a voicemail as directed and we will return your call as soon as possible. Messages left after 4 pm will be answered the following business day.    You may also send [place] a message via MyChart. We typically respond to MyChart messages within 1-2 business days.  For prescription refills, please ask your pharmacy to contact our office. Our fax number is [phone].  If you have an urgent issue when the clinic is closed that cannot wait until the next business day, you can page your doctor at the number below.     Please note that while we do our best to be available for urgent issues outside of office hours, we are not available 24/7.    If you have an urgent issue and are unable to reach [place], you may choose to seek medical  care at your doctor's office, retail clinic, urgent care center, or emergency room.   If you have a medical emergency, please immediately call 911 or go to the emergency department. In the event of inclement weather, please call our main line at [phone] for an update on the status of any delays or closures.  Dermatology Medication Tips: Please keep the boxes that topical medications come in in order to help keep track of the instructions about where and how to use these. Pharmacies typically print the medication instructions only on the boxes and not directly on the medication tubes.   If your medication is too  expensive, please contact our office at [phone] or send [place] a message through MyChart.    We are unable to tell what your co-pay for medications will be in advance as this is different depending on your insurance coverage. However, we may be able to find a substitute medication at lower cost or fill out paperwork to get insurance to cover a needed medication.    If a prior authorization is required to get your medication covered by your insurance company, please allow [place] 1-2 business days to complete this process.   Drug prices often vary depending on where the prescription is filled and some pharmacies may offer cheaper prices.   The website www.goodrx.com contains coupons for medications through different pharmacies. The prices here do not account for what the cost may be with help from insurance (it may be cheaper with your insurance), but the website can give you the price if you did not use any insurance.  - You can print the associated coupon and take it with your prescription to the pharmacy.  - You may also stop by our office during regular business hours and pick up a GoodRx coupon card.  - If you need your prescription sent electronically to a different pharmacy, notify our office through [facility] or by phone at [phone]

## 2024-06-12 NOTE — Progress Notes (Unsigned)
   New Patient Visit   Subjective  Pamela [name] is a 78 y.o. female who presents for the following: growth  Pt has a growth on her face she'd like evaluated. Present for several months, itching and bothersome, not previously treated.  Pt has no hx of skin cancer  The following portions of the chart were reviewed this encounter and updated as appropriate: medications, allergies, medical history  Review of Systems:  No other skin or systemic complaints except as noted in HPI or Assessment and Plan.  Objective  Well appearing patient in no apparent distress; mood and affect are within normal limits.  A focused examination was performed of the following areas: face  Relevant exam findings are noted in the Assessment and Plan.    Assessment & Plan   ACTINIC KERATOSIS Exam: Erythematous thin papules/macules with gritty scale at the left cheek and left neck  Actinic keratoses are precancerous spots that appear secondary to cumulative UV radiation exposure/sun exposure over time. They are chronic with expected duration over 1 year. A portion of actinic keratoses will progress to squamous cell carcinoma of the skin. It is not possible to reliably predict which spots will progress to skin cancer and so treatment is recommended to prevent development of skin cancer.  Recommend daily broad spectrum sunscreen SPF 30+ to sun-exposed areas, reapply every 2 hours as needed.  Recommend staying in the shade or wearing long sleeves, sun glasses (UVA+UVB protection) and wide brim hats (4-inch brim around the entire circumference of the hat). Call for new or changing lesions.  Treatment Plan: Start 5-fluorouracil cream twice a day for 14 days to affected areas including left cheek and left neck.  Reviewed course of treatment and expected reaction.  Patient advised to expect inflammation and crusting and advised that erosions are possible.  Patient advised to be diligent with sun  protection during and after treatment. Handout with details of how to apply medication and what to expect provided. Counseled to keep medication out of reach of children and pets.  Reviewed course of treatment and expected reaction.  Patient advised to expect inflammation and crusting and advised that erosions are possible.  Patient advised to be diligent with sun protection during and after treatment. Handout with details of how to apply medication and what to expect provided. Counseled to keep medication out of reach of children and pets.  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  I, Darice Smock, CMA, am acting as scribe for RUFUS CHRISTELLA HOLY, MD.   Documentation: I have reviewed the above documentation for accuracy and completeness, and I agree with the above.  RUFUS CHRISTELLA HOLY, MD

## 2024-06-17 ENCOUNTER — Encounter: Payer: Self-pay | Admitting: *Deleted

## 2024-06-17 ENCOUNTER — Telehealth: Payer: Self-pay

## 2024-06-17 ENCOUNTER — Encounter: Payer: Self-pay | Admitting: Cardiology

## 2024-06-17 ENCOUNTER — Ambulatory Visit: Attending: Cardiology | Admitting: Cardiology

## 2024-06-17 VITALS — BP 120/60 | HR 59 | Ht 61.2 in | Wt 145.0 lb

## 2024-06-17 DIAGNOSIS — I493 Ventricular premature depolarization: Secondary | ICD-10-CM | POA: Diagnosis not present

## 2024-06-17 DIAGNOSIS — I10 Essential (primary) hypertension: Secondary | ICD-10-CM

## 2024-06-17 DIAGNOSIS — I472 Ventricular tachycardia, unspecified: Secondary | ICD-10-CM

## 2024-06-17 DIAGNOSIS — R0609 Other forms of dyspnea: Secondary | ICD-10-CM | POA: Diagnosis not present

## 2024-06-17 MED ORDER — CARVEDILOL 12.5 MG PO TABS: 18.7500 mg | ORAL_TABLET | Freq: Two times a day (BID) | ORAL | 3 refills | Status: DC

## 2024-06-17 NOTE — Patient Instructions (Signed)
 Medication Instructions:   INCREASE: Carvedilol  to 12.5mg  1 1/2 tablets twice daily   Lab Work: CMP, MGM, TSH, ProBNP, CBC- today If you have labs (blood work) drawn today and your tests are completely normal, you will receive your results only by: MyChart Message (if you have MyChart) OR A paper copy in the mail If you have any lab test that is abnormal or we need to change your treatment, we will call you to review the results.   Testing/Procedures: Your physician has requested that you have an echocardiogram. Echocardiography is a painless test that uses sound waves to create images of your heart. It provides your doctor with information about the size and shape of your heart and how well your heart's chambers and valves are working. This procedure takes approximately one hour. There are no restrictions for this procedure. Please do NOT wear cologne, perfume, aftershave, or lotions (deodorant is allowed). Please arrive 15 minutes prior to your appointment time.  Please note: We ask at that you not bring children with you during ultrasound (echo/ vascular) testing. Due to room size and safety concerns, children are not allowed in the ultrasound rooms during exams. Our front office staff cannot provide observation of children in our lobby area while testing is being conducted. An adult accompanying a patient to their appointment will only be allowed in the ultrasound room at the discretion of the ultrasound technician under special circumstances. We apologize for any inconvenience.    Follow-Up: At [facility], you and your health needs are our priority.  As part of our continuing mission to provide you with exceptional heart care, we have created designated Provider Care Teams.  These Care Teams include your primary Cardiologist (physician) and Advanced Practice Providers (APPs -  Physician Assistants and Nurse Practitioners) who all work together to provide you with the care you need,  when you need it.  We recommend signing up for the patient portal called MyChart.  Sign up information is provided on this After Visit Summary.  MyChart is used to connect with patients for Virtual Visits (Telemedicine).  Patients are able to view lab/test results, encounter notes, upcoming appointments, etc.  Non-urgent messages can be sent to your provider as well.   To learn more about what you can do with MyChart, go to [email or web address].    Your next appointment:   3 month(s)  The format for your next appointment:   In Person  Provider:   Lamar Fitch, MD    Other Instructions Referral to Dr. Inocencio

## 2024-06-17 NOTE — Progress Notes (Signed)
 Cardiology Office Note:    Date:  06/17/2024   ID:  Pamela [name], DOB 05-14-1946, MRN [id number]  PCP:  Sherre Clapper, MD  Cardiologist:  Lamar Fitch, MD    Referring MD: Sherre Clapper, MD   Chief Complaint  Patient presents with   Follow-up  I am worried about my tests  History of Present Illness:    Pamela [name] is a 78 y.o. female past medical history significant for coronary artery disease, coronary CTA done in January 2023 show calcium  score 20.4 minimal CAD's, in April of this year she came to hospital because of syncope, cardiac catheterization has been done which showed normal coronaries, echocardiogram done at that time showed ejection fraction 45 to 50%, moderately dilated left atrium, mild mitral valve regurgitation, in May of this year she did wear a monitor which showed only 2% burden of PVCs, recently a monitor has been repeated because of symptoms of weakness fatigue which showed frequent ventricular ectopy total burden of 23.4% multiple episodes ventricular tachycardia..  Morphology of PVCs is RVOT, or trigger of this investigational was observed syncope as well as dizziness.  Since I seen her last time she said she does have some dizzy spells but not as bad as they were before those usually happening in the morning when she gets up from her back.  No passing out.  Denies have any chest pain tightness squeezing pressure burning chest  Past Medical History:  Diagnosis Date   Abnormal CT of the chest 07/20/2023   Acquired thrombophilia 06/27/2021   Acute combined systolic and diastolic heart failure (HCC) 12/08/2023   Acute cystitis without hematuria 06/12/2022   Acute heart failure with mildly reduced ejection fraction (HFmrEF, 41-49%) (HCC) 12/07/2023   Acute LUQ pain 02/12/2024   Acute on chronic diastolic (congestive) heart failure (HCC) 12/07/2023   Anemia in chronic kidney disease 09/06/2023   Anticoagulated on Coumadin      chronic--- managed by hematology/ oncology,   Asthma    Asthma, mild intermittent    followed by pcp--- per pt last exacerbation winter 2021 w/ acute bronchitis   Athscl heart disease of native coronary artery w/o ang pctrs 09/06/2023   Atrial fibrillation [I48.91] 10/01/2014   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, NYHA class 1 (HCC) 05/23/2011   Bilateral leg cramps    Blood dyscrasia    Bunion 08/23/2021   Cervical radiculopathy 07/19/2021   Cervical spondylosis 09/06/2023   Chest pain at rest 01/22/2024   Chronic constipation    Chronic diastolic congestive heart failure (HCC) 12/09/2020   Chronic pain of left ankle 03/31/2024   CKD (chronic kidney disease), stage III (HCC)    CKD stage 3a, GFR 45-59 ml/min (HCC) 11/05/2023   Closed bimalleolar fracture of left ankle 02/26/2020   Closed fracture of fifth metatarsal bone of left foot 12/06/2021   Closed fracture of fourth metatarsal bone 05/07/2021   Compression fracture of L1 lumbar vertebra (HCC) 12/17/2022   DDD (degenerative disc disease), cervical    w/ spondylosis,  per pt last steroid injection 06/ 2022   DDD (degenerative disc disease), lumbosacral    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   DJD (degenerative joint disease)    DVT (deep venous thrombosis) (HCC) 07/30/2013   Dyspnea    occasionally   Dyspnea on exertion 03/03/2013   Dysrhythmia    Dysuria 04/17/2023   Elevated rheumatoid factor 05/03/2024   Encntr for surgical aftcr following surgery on the circ sys 09/06/2023  Encounter for immunization 07/20/2023   Encounter for Medicare annual wellness exam 04/04/2024   Encounter for orthopedic follow-up care 03/10/2020   Flank pain 02/12/2024   GERD (gastroesophageal reflux disease)    Hammer toe 08/23/2021   Heart murmur    as a child     History of cardiac murmur as a child    History of DVT of lower extremity    left lower extremity in 1980s, fell when bowling   History of falling  09/06/2023   History of pulmonary embolus (PE) 1993   per pt left lung post op 2 wks cholecystectomy   History of rheumatic fever as a child    per last echo 12-31-2019 no valvular issues   History of TIA (transient ischemic attack)    2014 and 2018 or 2019,  per pt no residual   HTN (hypertension)    followed by pcp   Hyperkalemia 03/31/2024   Hyperlipidemia    Hypertensive heart and chronic kidney disease with heart failure and stage 1 through stage 4 chronic kidney disease, or unspecified chronic kidney disease (HCC) 12/12/2023   Hyponatremia 02/06/2019   Na 126 on no obvious meds to cause it with last na 133 in 2014      Hypothyroidism    IDA (iron deficiency anemia)    Joint pain 10/24/2021   Low back strain 08/09/2018   Lumbar radiculopathy 08/13/2021   Macular degeneration of both eyes    Mild obstructive sleep apnea    per pt dx 2017 tried to uses cpap but intolerant   Mixed incontinence urge and stress    urologist--- dr marda   Muscle pain 10/24/2021   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   OA (osteoarthritis) 07/06/2018   Osteoporosis    Other cardiomyopathies (HCC) 09/06/2023   Other instability, left foot 03/28/2024   Other microscopic hematuria 03/31/2024   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   PAF (paroxysmal atrial fibrillation) (HCC) 10/01/2014   cardiologist--- dr dub tobb;   cardiac cath 02-18-2013 normal coronaries arteries, ef 50%, cath done since echo showed ef 30-35%; nucleat stress study 04/ 2020 normal , normal echo 04/ 2021,  event monitor 09-14-2020 rare ST/AT variable block   Pain in left foot 12/17/2021   Pain of right hip joint 08/11/2022   Personal history of pneumonia (recurrent) 09/06/2023   Personal history of urinary (tract) infections 09/06/2023   Pneumonia    PONV (postoperative nausea and vomiting)    Presence of left artificial knee joint 09/06/2023   Protein S  deficiency    followed by hemotology/ oncology-- dr d. ezzard ([facility]) dx 1980s;  prior DVT left lower leg 1980s and left lung PE 1993; chronic  coumadin  since 1980s   PVC's (premature ventricular contractions)    followed by cardiology   S/P cardiac catheterization 02/2013   Normal coronaries; low normal EF at 50%   Skin cancer screening 02/12/2024   Solitary pulmonary nodule 09/06/2023   Solitary pulmonary nodule on lung CT 02/06/2019   First noted 01/13/2014 > no change as of 12/21/2018   Spinal stenosis of lumbar region 07/19/2021   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/22/2022   Spondylolisthesis, lumbar region 08/09/2018   Sprain of right ankle 10/13/2023   Stage 3b chronic kidney disease (HCC) 12/17/2022   Stroke (HCC)    TIA in 2018 or 2019   Syncope 01/22/2024   Transient ischemic attack 07/30/2013  Traumatic arthritis of left ankle 02/12/2024   Type 2 diabetes mellitus treated with insulin  (HCC)    endocrilogist--- whitney reardon NP     (03-10-2021  pt continuously checks blood sugar throughout the day w/ Libre, fasting sugar --- 69--200)   Type 2 diabetes mellitus with diabetic neuropathy, with long-term current use of insulin  (HCC) 06/15/2021   Unilateral primary osteoarthritis, right knee 07/06/2018   Urinary frequency 02/12/2024   UTI (urinary tract infection) 01/23/2024   UTI (urinary tract infection) due to Enterococcus 01/22/2024   Ventricular premature depolarization 09/06/2023   Wears glasses    Wears hearing aid in both ears     Past Surgical History:  Procedure Laterality Date   ABDOMINAL HYSTERECTOMY     BLEPHAROPLASTY     CARDIAC CATHETERIZATION  02/18/2013   @ [facility]  by Dr [place];  normal coronaries w/ preserved LVF, ef 50%;   previous cath 2001 normal ef 65%   CARPAL TUNNEL RELEASE Bilateral 1994   CATARACT EXTRACTION W/ INTRAOCULAR LENS IMPLANT Bilateral 2017   CHOLECYSTECTOMY     CHOLECYSTECTOMY,  LAPAROSCOPIC  1993   COLONOSCOPY     EAR BIOPSY Left    FINGER SURGERY Left 2018   thumb   FOOT SURGERY     FOOT TENDON SURGERY Right    early 2000s   FRACTURE SURGERY Left 02/13/2020   left femur   HAND SURGERY Right 04/2022   HARDWARE REMOVAL Left 03/12/2021   Procedure: HARDWARE REMOVAL;  Surgeon: Sharl Selinda Dover, MD;  Location: [facility];  Service: Orthopedics;  Laterality: Left;  60 MINS   JOINT REPLACEMENT     LEFT HEART CATH AND CORONARY ANGIOGRAPHY N/A 12/07/2023   Procedure: LEFT HEART CATH AND CORONARY ANGIOGRAPHY;  Surgeon: Verlin Lonni BIRCH, MD;  Location: [facility];  Service: Cardiovascular;  Laterality: N/A;   LUMBAR LAMINECTOMY/DECOMPRESSION MICRODISCECTOMY N/A 12/22/2022   Procedure: Lumbar Two-Lumbar Three, Lumbar Three-Lumbar Four Lumbar Decompression;  Surgeon: Colon Shove, MD;  Location: [facility] OR;  Service: Neurosurgery;  Laterality: N/A;  RM 20 3C   ORIF ANKLE FRACTURE Left 02/26/2020   Procedure: OPEN REDUCTION INTERNAL FIXATION (ORIF) ANKLE FRACTURE;  Surgeon: Sharl Selinda Dover, MD;  Location: [facility] OR;  Service: Orthopedics;  Laterality: Left;  90 mins   TONSILLECTOMY AND ADENOIDECTOMY Bilateral    TOTAL KNEE ARTHROPLASTY Left 03/2005   TOTAL VAGINAL HYSTERECTOMY  1988   per pt still has ovaries    Current Medications: Current Meds  Medication Sig   acetaminophen  (TYLENOL ) 650 MG CR tablet Take 650-1,300 mg by mouth every 8 (eight) hours as needed for pain.   amLODipine  (NORVASC ) 10 MG tablet Take 10 mg by mouth daily.   aspirin  EC (ASPIRIN  LOW DOSE) 81 MG tablet Take 1 tablet (81 mg total) by mouth daily. Swallow whole.   atorvastatin  (LIPITOR ) 80 MG tablet TAKE 1 TABLET(80 MG) BY MOUTH DAILY   blood glucose meter kit and supplies KIT Dispense based on patient and insurance preference. Use up to four times daily as directed.   carboxymethylcellulose (REFRESH PLUS) 0.5 % SOLN Place 1 drop into both eyes 3 (three) times  daily as needed (dry eyes).   Cholecalciferol (VITAMIN D3) 50 MCG (2000 UT) TABS Take 4,000 Units by mouth in the morning and at bedtime.   cyanocobalamin  (VITAMIN B12) 1000 MCG tablet Take 1,000 mcg by mouth daily with lunch.   ELIQUIS  5 MG TABS tablet TAKE 1 TABLET(5 MG) BY MOUTH TWICE DAILY   ferrous  sulfate 325 (65 FE) MG tablet Take 325 mg by mouth daily with lunch.   fluorouracil (EFUDEX) 5 % cream Apply topically 2 (two) times daily.   fluticasone  (FLONASE ) 50 MCG/ACT nasal spray Place 2 sprays into both nostrils daily.   furosemide  (LASIX ) 20 MG tablet Take 1 tablet (20 mg total) by mouth daily. TAKE 1 TABLET(20 MG) BY MOUTH DAILY   GEMTESA  75 MG TABS Take 1 tablet (75 mg total) by mouth at bedtime.   glucose blood (ACCU-CHEK GUIDE TEST) test strip Use to check blood sugar up to 4 times per day   insulin  aspart (NOVOLOG  FLEXPEN) 100 UNIT/ML FlexPen Max daily 30 units   insulin  glargine, 1 Unit Dial , (TOUJEO  SOLOSTAR) 300 UNIT/ML Solostar Pen Inject 8 Units into the skin daily in the afternoon.   Insulin  Pen Needle 32G X 4 MM MISC 1 Device by Does not apply route in the morning, at noon, in the evening, and at bedtime.   magnesium  oxide (MAG-OX) 400 (240 Mg) MG tablet TAKE 2 TABLETS(800 MG) BY MOUTH TWICE DAILY   methenamine  (HIPREX ) 1 g tablet Take 1 tablet (1 g total) by mouth 2 (two) times daily with a meal.   olmesartan  (BENICAR ) 40 MG tablet Take 1 tablet (40 mg total) by mouth daily.   ondansetron  (ZOFRAN -ODT) 8 MG disintegrating tablet Take 1 tablet (8 mg total) by mouth every 8 (eight) hours as needed for nausea or vomiting.   pantoprazole  (PROTONIX ) 40 MG tablet TAKE 1 TABLET(40 MG) BY MOUTH EVERY MORNING   pyridoxine  (B-6) 100 MG tablet Take 100 mg by mouth daily with lunch.   sennosides-docusate sodium  (SENOKOT-S) 8.6-50 MG tablet Take 1 tablet by mouth every other day.   sodium chloride  (OCEAN) 0.65 % SOLN nasal spray Place 1 spray into both nostrils as needed for congestion.    valACYclovir (VALTREX) 500 MG tablet Take 1 tablet (500 mg total) by mouth daily for 14 days.   zolpidem  (AMBIEN ) 5 MG tablet TAKE 1 TABLET(5 MG) BY MOUTH AT BEDTIME AS NEEDED FOR SLEEP   [DISCONTINUED] carvedilol  (COREG ) 12.5 MG tablet TAKE 1 TABLET(12.5 MG) BY MOUTH TWICE DAILY WITH A MEAL     Allergies:   Loxapine succinate, Macrodantin  [nitrofurantoin  macrocrystal], Naproxen sodium, Telithromycin, Amoxapine and related, Darvon, Levothyroxine  sodium, Belsomra [suvorexant], Clavulanic acid, Cymbalta [duloxetine hcl], Duloxetine, Gabapentin , Jardiance [empagliflozin], Lyrica  [pregabalin ], Naproxen, Ozempic  (0.25 or 0.5 mg-dose) [semaglutide (0.25 or 0.5mg -dos)], Semaglutide , Amoxicillin , and Propoxyphene   Social History   Socioeconomic History   Marital status: Married    Spouse name: Fairy   Number of children: Not on file   Years of education: Not on file   Highest education level: Some college, no degree  Occupational History   Not on file  Tobacco Use   Smoking status: Never   Smokeless tobacco: Never  Vaping Use   Vaping status: Never Used  Substance and Sexual Activity   Alcohol use: No   Drug use: Never   Sexual activity: Not on file    Comment: Hysterectomy  Other Topics Concern   Not on file  Social History Narrative   Lives with spouse in [place].  2 grown daughters.   Retired Print production planner     Social Drivers of Corporate investment banker Strain: Low Risk  (03/29/2024)   Overall Financial Resource Strain (CARDIA)    Difficulty of Paying Living Expenses: Not very hard  Food Insecurity: No Food Insecurity (03/29/2024)   Hunger Vital Sign    Worried About  Running Out of Food in the Last Year: Never true    Ran Out of Food in the Last Year: Never true  Transportation Needs: No Transportation Needs (03/29/2024)   PRAPARE - Administrator, Civil Service (Medical): No    Lack of Transportation (Non-Medical): No  Physical Activity: Inactive (03/29/2024)    Exercise Vital Sign    Days of Exercise per Week: 0 days    Minutes of Exercise per Session: 0 min  Stress: No Stress Concern Present (03/29/2024)   [employer] of Occupational Health - Occupational Stress Questionnaire    Feeling of Stress: Only a little  Social Connections: Socially Integrated (03/29/2024)   Social Connection and Isolation Panel    Frequency of Communication with Friends and Family: More than three times a week    Frequency of Social Gatherings with Friends and Family: Once a week    Attends Religious Services: More than 4 times per year    Active Member of [employer] or Organizations: Yes    Attends Engineer, structural: More than 4 times per year    Marital Status: Married     Family History: The patient's family history includes Antithrombin III deficiency in her mother; Cirrhosis in her mother; Coronary artery disease in her father; Diabetes in her father and sister; Heart attack in her brother, father, and sister; Hypertension in her brother, father, and sister; Kidney disease in her father; Lung cancer in her brother; Protein S deficiency in her daughter; Ulcerative colitis in her mother. There is no history of Stroke, Colitis, Colon polyps, Esophageal cancer, Liver cancer, Pancreatic cancer, Rectal cancer, Stomach cancer, or Breast cancer. ROS:   Please see the history of present illness.    All 14 point review of systems negative except as described per history of present illness  EKGs/Labs/Other Studies Reviewed:         Recent Labs: 01/23/2024: TSH 2.012 03/19/2024: BNP 331.4 04/15/2024: Magnesium  1.8; NT-Pro BNP 1,046 06/05/2024: ALT 47; BUN 33; Creatinine 1.17; Hemoglobin 12.1; Platelet Count 282; Potassium 4.8; Sodium 135  Recent Lipid Panel    Component Value Date/Time   CHOL 110 05/22/2024 0804   TRIG 52 05/22/2024 0804   HDL 49 05/22/2024 0804   CHOLHDL 2.2 05/22/2024 0804   LDLCALC 49 05/22/2024 0804    Physical Exam:    VS:  BP  120/60   Pulse (!) 59   Ht 5' 1.2 (1.554 m)   Wt 145 lb (65.8 kg)   SpO2 98%   BMI 27.22 kg/m     Wt Readings from Last 3 Encounters:  06/17/24 145 lb (65.8 kg)  06/05/24 144 lb 4.8 oz (65.5 kg)  06/04/24 147 lb (66.7 kg)     GEN:  Well nourished, well developed in no acute distress HEENT: Normal NECK: No JVD; No carotid bruits LYMPHATICS: No lymphadenopathy CARDIAC: RRR, no murmurs, no rubs, no gallops RESPIRATORY:  Clear to auscultation without rales, wheezing or rhonchi  ABDOMEN: Soft, non-tender, non-distended MUSCULOSKELETAL:  No edema; No deformity  SKIN: Warm and dry LOWER EXTREMITIES: no swelling NEUROLOGIC:  Alert and oriented x 3 PSYCHIATRIC:  Normal affect   ASSESSMENT:    1. PVC's (premature ventricular contractions)   2. Uncontrolled hypertension   3. Ventricular tachycardia (HCC)   4. Dyspnea on exertion    PLAN:    In order of problems listed above:  Frequent ventricular ectopy a total burden of 23%, I will increase dose of carvedilol  from 12.5 twice daily  to 1-1/2 tablets twice daily, I will check potassium magnesium  TSH today.  I will refer her to our EP team for evaluation, I will do also echocardiogram to recheck left ventricular ejection fraction. High blood pressure which seems to be decently controlled today we will continue present management. Paroxysmal atrial fibrillation, on Eliquis  5 mg twice daily which I will continue.  Denies have any recurrences. Syncope none lately, but she will be referred to EP for evaluation of ventricular tachycardia, frequent ventricular ectopy as well as potential implantable loop recorder, she does already have schedule echocardiogram to recheck left ventricle ejection fraction   Medication Adjustments/Labs and Tests Ordered: Current medicines are reviewed at length with the patient today.  Concerns regarding medicines are outlined above.  Orders Placed This Encounter  Procedures   Comprehensive metabolic panel  with GFR   Magnesium    TSH   Pro b natriuretic peptide (BNP)   CBC   Ambulatory referral to Cardiac Electrophysiology   ECHOCARDIOGRAM COMPLETE   Medication changes:  Meds ordered this encounter  Medications   carvedilol  (COREG ) 12.5 MG tablet    Sig: Take 1.5 tablets (18.75 mg total) by mouth 2 (two) times daily.    Dispense:  60 tablet    Refill:  3    Signed, Lamar DOROTHA Fitch, MD, [facility] 06/17/2024 11:53 AM    [facility]

## 2024-06-17 NOTE — Telephone Encounter (Signed)
 Can we send a refill or give her some samples? Please advice  Copied from CRM [id number]. Topic: Clinical - Medication Question >> Jun 17, 2024 12:31 PM Antony RAMAN wrote: Reason for CRM:  inquiring about the medicine she got samples of, wanting a refill, kerendia 

## 2024-06-18 ENCOUNTER — Ambulatory Visit: Attending: Cardiology

## 2024-06-18 DIAGNOSIS — R0609 Other forms of dyspnea: Secondary | ICD-10-CM

## 2024-06-18 LAB — PRO B NATRIURETIC PEPTIDE: NT-Pro BNP: 766 pg/mL — ABNORMAL HIGH (ref 0–738)

## 2024-06-18 LAB — CBC
Hematocrit: 36 % (ref 34.0–46.6)
Hemoglobin: 11.8 g/dL (ref 11.1–15.9)
MCH: 30.6 pg (ref 26.6–33.0)
MCHC: 32.8 g/dL (ref 31.5–35.7)
MCV: 94 fL (ref 79–97)
Platelets: 271 x10E3/uL (ref 150–450)
RBC: 3.85 x10E6/uL (ref 3.77–5.28)
RDW: 12.3 % (ref 11.7–15.4)
WBC: 7.4 x10E3/uL (ref 3.4–10.8)

## 2024-06-18 LAB — TSH: TSH: 3.06 u[IU]/mL (ref 0.450–4.500)

## 2024-06-18 LAB — COMPREHENSIVE METABOLIC PANEL WITH GFR
ALT: 33 IU/L — ABNORMAL HIGH (ref 0–32)
AST: 31 IU/L (ref 0–40)
Albumin: 4.1 g/dL (ref 3.8–4.8)
Alkaline Phosphatase: 122 IU/L (ref 49–135)
BUN/Creatinine Ratio: 31 — ABNORMAL HIGH (ref 12–28)
BUN: 32 mg/dL — ABNORMAL HIGH (ref 8–27)
Bilirubin Total: 0.4 mg/dL (ref 0.0–1.2)
CO2: 26 mmol/L (ref 20–29)
Calcium: 9.6 mg/dL (ref 8.7–10.3)
Chloride: 96 mmol/L (ref 96–106)
Creatinine, Ser: 1.02 mg/dL — ABNORMAL HIGH (ref 0.57–1.00)
Globulin, Total: 2.3 g/dL (ref 1.5–4.5)
Glucose: 112 mg/dL — ABNORMAL HIGH (ref 70–99)
Potassium: 5.2 mmol/L (ref 3.5–5.2)
Sodium: 134 mmol/L (ref 134–144)
Total Protein: 6.4 g/dL (ref 6.0–8.5)
eGFR: 56 mL/min/1.73 — ABNORMAL LOW (ref >59)

## 2024-06-18 LAB — MAGNESIUM: Magnesium: 2.3 mg/dL (ref 1.6–2.3)

## 2024-06-19 ENCOUNTER — Other Ambulatory Visit: Payer: Self-pay

## 2024-06-19 ENCOUNTER — Other Ambulatory Visit

## 2024-06-19 LAB — ECHOCARDIOGRAM COMPLETE
AR max vel: 0.99 cm2
AV Area VTI: 0.96 cm2
AV Area mean vel: 0.95 cm2
AV Mean grad: 5.6 mmHg
AV Peak grad: 9.6 mmHg
Ao pk vel: 1.55 m/s
Area-P 1/2: 2.54 cm2
MV M vel: 3.94 m/s
MV Peak grad: 62.1 mmHg
MV VTI: 0.59 cm2
S' Lateral: 3.7 cm

## 2024-06-19 MED ORDER — KERENDIA 10 MG PO TABS: 10.0000 mg | ORAL_TABLET | Freq: Every day | ORAL | 1 refills | Status: DC

## 2024-06-20 ENCOUNTER — Ambulatory Visit: Payer: Self-pay | Admitting: Cardiology

## 2024-06-20 ENCOUNTER — Telehealth: Payer: Self-pay

## 2024-06-20 NOTE — Telephone Encounter (Signed)
 Left message on My Chart with lab results per Dr. Karry note. Routed to PCP

## 2024-06-24 ENCOUNTER — Telehealth: Payer: Self-pay

## 2024-06-24 NOTE — Telephone Encounter (Signed)
 Pt viewed lab results on My Chart per Dr. Karry note. Routed to PCP.

## 2024-06-26 ENCOUNTER — Telehealth: Payer: Self-pay

## 2024-06-26 NOTE — Telephone Encounter (Signed)
 Left message on My Chart with Echo results per Dr. Karry note. Routed to PCP.

## 2024-06-28 ENCOUNTER — Telehealth: Payer: Self-pay

## 2024-06-28 NOTE — Telephone Encounter (Signed)
 Pt viewed Echo results on My Chart per Dr. Vanetta Shawl note. Routed to PCP.

## 2024-07-03 ENCOUNTER — Telehealth: Payer: Self-pay

## 2024-07-03 NOTE — Telephone Encounter (Signed)
 Pt called to say she was having a severe reaction to efudex and was concerned. I told her this is the reaction we want and that she can put vaseline on top but continue treatment

## 2024-07-04 ENCOUNTER — Encounter (HOSPITAL_BASED_OUTPATIENT_CLINIC_OR_DEPARTMENT_OTHER): Admitting: Family

## 2024-07-06 NOTE — Progress Notes (Unsigned)
 Electrophysiology Office Note:   Date:  07/08/2024  ID:  Pamela [name], DOB [birth year], MRN [id number]  Primary Cardiologist: Lamar Fitch, MD Primary Heart Failure: None Electrophysiologist: Jamie Belger Gladis Norton, MD      History of Present Illness:   Pamela [name] is a 78 y.o. female with h/o hypertension, type 2 diabetes, chronic diastolic heart failure, protein S deficiency, DVT on Coumadin , hyperlipidemia, PVCs, atrial fibrillation, TIA, SVT seen today for  for Electrophysiology evaluation of PVCs at the request of Robert Krasowski.    She had a left heart catheterization April 2025 with no coronary artery disease.  Echo showed a mildly reduced ejection fraction of 45 to 50%.  She presented to the hospital with syncope at the time.  She wore cardiac monitor in May that showed a 2% PVC burden.  She had a repeat monitor done due to symptoms of fatigue that showed an increased PVC burden of 23.4%.  Discussed the use of AI scribe software for clinical note transcription with the patient, who gave verbal consent to proceed.  History of Present Illness Pamlea Jillienne [name] is a 78 year old female with premature ventricular contractions who presents with symptoms of fatigue and shortness of breath. She is accompanied by her husband, Fairy, and her daughter, Gaylan. She was referred by Dr. Krasowski for evaluation of her heart rhythm issues.  She experiences significant fatigue and shortness of breath, particularly on days when her heart rhythm is irregular. She describes episodes of her heart racing and then slowing down, leaving her feeling exhausted and short of breath. These symptoms occur approximately four days a week and have been progressively worsening since last fall.  She attempts to maintain physical activity by walking to her mailbox, which is approximately 200-300 feet away and uphill. On good days, she can make the trip five times, but on bad  days, she struggles to complete it twice.  A heart monitor showed she is experiencing premature ventricular contractions about 25% of the time.  She has a history of thyroid  issues and previously reacted to Synthroid , leading to its discontinuation without replacement therapy. She also has uncontrolled diabetes with a recent A1c of 7.  She has a family history of heart problems, with her sister diagnosed with microvascular dysfunction and a cousin's daughter undergoing genetic testing for heart issues.  No recent episodes of passing out since May.    Review of systems complete and found to be negative unless listed in HPI.   EP Information / Studies Reviewed:    EKG is ordered today. Personal review as below.  EKG Interpretation Date/Time:  Monday July 08 2024 09:14:05 EST Ventricular Rate:  59 PR Interval:  164 QRS Duration:  98 QT Interval:  410 QTC Calculation: 405 R Axis:   10  Text Interpretation: Sinus bradycardia with sinus arrhythmia with occasional Premature ventricular complexes Nonspecific ST abnormality When compared with ECG of 10-May-2024 13:32, Premature ventricular complexes burden reduced Confirmed by Faustine Tates ([id number]) on 07/08/2024 9:18:16 AM     Risk Assessment/Calculations:            Physical Exam:   VS:  BP (!) 118/50 (BP Location: Left Arm, Patient Position: Sitting, Cuff Size: Normal)   Pulse 60   Ht 5' 1.2 (1.554 m)   Wt 143 lb (64.9 kg)   SpO2 99%   BMI 26.84 kg/m    Wt Readings from Last 3 Encounters:  07/08/24 143 lb (64.9 kg)  06/17/24 145  lb (65.8 kg)  06/05/24 144 lb 4.8 oz (65.5 kg)     GEN: Well nourished, well developed in no acute distress NECK: No JVD; No carotid bruits CARDIAC: Regular rate and rhythm, no murmurs, rubs, gallops RESPIRATORY:  Clear to auscultation without rales, wheezing or rhonchi  ABDOMEN: Soft, non-tender, non-distended EXTREMITIES:  No edema; No deformity   ASSESSMENT AND PLAN:    1.  PVCs: 23%  burden on most recent cardiac monitor-personally reviewed.  She is not having PVCs today.  She feels well today.  In review, she has some outflow tract PVCs, but also has 2 other PVCs on her EKGs.  She has had a dominant PVC morphology on her cardiac monitor.  Due to multiple PVCs, I think treating her medically would be most ideal.  Katalia Choma start flecainide 50 mg daily.  She Shameek Nyquist come back in 2 weeks for an EKG.  2.  Hypertension:well controlled  3.  Paroxysmal atrial fibrillation: Minimal episodes noted  4.  Secondary hypercoagulable state: On Eliquis   Follow up with EP Team in 3 months  Signed, Sutton Plake Gladis Norton, MD

## 2024-07-08 ENCOUNTER — Ambulatory Visit: Attending: Cardiology | Admitting: Cardiology

## 2024-07-08 ENCOUNTER — Encounter: Payer: Self-pay | Admitting: Cardiology

## 2024-07-08 DIAGNOSIS — I428 Other cardiomyopathies: Secondary | ICD-10-CM | POA: Diagnosis not present

## 2024-07-08 DIAGNOSIS — I480 Paroxysmal atrial fibrillation: Secondary | ICD-10-CM | POA: Diagnosis not present

## 2024-07-08 DIAGNOSIS — I493 Ventricular premature depolarization: Secondary | ICD-10-CM | POA: Diagnosis not present

## 2024-07-08 DIAGNOSIS — R002 Palpitations: Secondary | ICD-10-CM | POA: Diagnosis not present

## 2024-07-08 MED ORDER — FLECAINIDE ACETATE 50 MG PO TABS: 50.0000 mg | ORAL_TABLET | Freq: Two times a day (BID) | ORAL | 3 refills | Status: AC

## 2024-07-08 NOTE — Patient Instructions (Signed)
 Medication Instructions:  Your physician has recommended you make the following change in your medication:  START Flecainide 50 mg twice a day   *If you need a refill on your cardiac medications before your next appointment, please call your pharmacy*     Follow-Up: At [facility], you and your health needs are our priority.  As part of our continuing mission to provide you with exceptional heart care, our providers are all part of one team.  This team includes your primary Cardiologist (physician) and Advanced Practice Providers or APPs (Physician Assistants and Nurse Practitioners) who all work together to provide you with the care you need, when you need it.   Your physician recommends that you schedule a follow-up appointment in: 2 weeks for nurse visit EKG after starting Flecainide   Your next appointment:   3 month(s)   Provider:   Soyla Norton, MD       Thank you for choosing [facility]!!    Maeola Domino, RN [phone]     Other Instructions   Flecainide Tablets What is this medication? FLECAINIDE (FLEK a nide) prevents and treats a fast or irregular heartbeat (arrhythmia). It is often used to treat a type of arrhythmia known as AFib (atrial fibrillation). It works by slowing down overactive electric signals in the heart, which stabilizes your heart rhythm. It belongs to a group of medications called antiarrhythmics. This medicine may be used for other purposes; ask your health care provider or pharmacist if you have questions. COMMON BRAND NAME(S): Tambocor What should I tell my care team before I take this medication? They need to know if you have any of these conditions: High or low levels of potassium in the blood Heart disease including heart rhythm and heart rate problems Kidney disease Liver disease Recent heart attack An unusual or allergic reaction to flecainide, other medications, foods, dyes, or preservatives Pregnant or trying to get  pregnant Breastfeeding How should I use this medication? Take this medication by mouth with a glass of water. Take it as directed on the prescription label at the same time every day. You can take it with or without food. If it upsets your stomach, take it with food. Do not take your medication more often than directed. Do not stop taking this medication suddenly. This may cause serious, heart-related side effects. If your care team wants you to stop the medication, the dose may be slowly lowered over time to avoid any side effects. Talk to your care team about the use of this medication in children. While it may be prescribed for children as young as 1 year for selected conditions, precautions do apply. Overdosage: If you think you have taken too much of this medicine contact a poison control center or emergency room at once. NOTE: This medicine is only for you. Do not share this medicine with others. What if I miss a dose? If you miss a dose, take it as soon as you can. If it is almost time for your next dose, take only that dose. Do not take double or extra doses. What may interact with this medication? Do not take this medication with any of the following: Amoxapine Arsenic trioxide Certain antibiotics, such as clarithromycin, erythromycin, gatifloxacin, gemifloxacin, levofloxacin, moxifloxacin, sparfloxacin, or troleandomycin Certain antidepressants, called tricyclic antidepressants such as amitriptyline, imipramine, or nortriptyline Certain medications for irregular heartbeat, such as disopyramide, encainide, moricizine, procainamide, propafenone, and quinidine Cisapride Delavirdine Droperidol Haloperidol Hawthorn Imatinib Levomethadyl Maprotiline Medications for malaria, such  as chloroquine and halofantrine Pentamidine Phenothiazines, such as chlorpromazine, mesoridazine, prochlorperazine, thioridazine Pimozide Quinine Ranolazine Ritonavir Sertindole This medication may also  interact with the following: Cimetidine Dofetilide Medications for angina or blood pressure Medications for irregular heartbeat, such as amiodarone and digoxin Ziprasidone This list may not describe all possible interactions. Give your health care provider a list of all the medicines, herbs, non-prescription drugs, or dietary supplements you use. Also tell them if you smoke, drink alcohol, or use illegal drugs. Some items may interact with your medicine. What should I watch for while using this medication? Visit your care team for regular checks on your progress. Because your condition and the use of this medication carries some risk, it is a good idea to carry an identification card, necklace, or bracelet with details of your condition, medications, and care team. Check your blood pressure and pulse rate as directed. Know what your blood pressure and pulse rate should be and when tod contact your care team. Your care team may schedule regular blood tests and electrocardiograms to check your progress. This medication may affect your coordination, reaction time, or judgment. Do not drive or operate machinery until you know how this medication affects you. Sit up or stand slowly to reduce the risk of dizzy or fainting spells. Drinking alcohol with this medication can increase the risk of these side effects. What side effects may I notice from receiving this medication? Side effects that you should report to your care team as soon as possible: Allergic reactions--skin rash, itching, hives, swelling of the face, lips, tongue, or throat Heart failure--shortness of breath, swelling of the ankles, feet, or hands, sudden weight gain, unusual weakness or fatigue Heart rhythm changes--fast or irregular heartbeat, dizziness, feeling faint or lightheaded, chest pain, trouble breathing Liver injury--right upper belly pain, loss of appetite, nausea, light-colored stool, dark yellow or brown urine, yellowing skin  or eyes, unusual weakness or fatigue Side effects that usually do not require medical attention (report to your care team if they continue or are bothersome): Blurry vision Constipation Dizziness Fatigue Headache Nausea Tremors or shaking This list may not describe all possible side effects. Call your doctor for medical advice about side effects. You may report side effects to FDA at 1-800-FDA-1088. Where should I keep my medication? Keep out of the reach of children and pets. Store at room temperature between 15 and 30 degrees C (59 and 86 degrees F). Protect from light. Keep container tightly closed. Throw away any unused medication after the expiration date. NOTE: This sheet is a summary. It may not cover all possible information. If you have questions about this medicine, talk to your doctor, pharmacist, or health care provider.  2024 Elsevier/Gold Standard (2022-03-30 00:00:00)

## 2024-07-14 ENCOUNTER — Other Ambulatory Visit: Payer: Self-pay | Admitting: Family Medicine

## 2024-07-24 ENCOUNTER — Ambulatory Visit: Attending: Cardiology

## 2024-07-24 VITALS — BP 98/48 | HR 55 | Ht 61.0 in | Wt 145.0 lb

## 2024-07-24 DIAGNOSIS — Z5181 Encounter for therapeutic drug level monitoring: Secondary | ICD-10-CM

## 2024-07-24 NOTE — Progress Notes (Signed)
   Nurse Visit   Date of Encounter: 07/24/2024 ID: Pamela [name], DOB 07/23/1946, MRN [id number]  PCP:  Sherre Clapper, MD   [facility] Providers Cardiologist:  Lamar Fitch, MD Electrophysiologist:  Soyla Gladis Norton, MD      Visit Details   VS:  BP (!) 98/48   Pulse (!) 55   Ht 5' 1 (1.549 m)   Wt 145 lb (65.8 kg)   SpO2 97%   BMI 27.40 kg/m  , BMI Body mass index is 27.4 kg/m.  Wt Readings from Last 3 Encounters:  07/24/24 145 lb (65.8 kg)  07/08/24 143 lb (64.9 kg)  06/17/24 145 lb (65.8 kg)     Reason for visit: EKG Performed today: Vitals, EKG, Provider consulted:Camnitz, and Education Changes (medications, testing, etc.) : None sent to Dr. Norton and advised Maeola will let pt know if changes are needed Length of Visit: 15 minutes    Medications Adjustments/Labs and Tests Ordered: Orders Placed This Encounter  Procedures   EKG 12-Lead   No orders of the defined types were placed in this encounter.    Signed, Melene Meissner, RN  07/24/2024 5:17 PM

## 2024-07-30 ENCOUNTER — Other Ambulatory Visit: Payer: Self-pay | Admitting: Cardiology

## 2024-07-30 DIAGNOSIS — R252 Cramp and spasm: Secondary | ICD-10-CM

## 2024-07-30 DIAGNOSIS — I10 Essential (primary) hypertension: Secondary | ICD-10-CM

## 2024-08-13 ENCOUNTER — Ambulatory Visit: Admitting: Dermatology

## 2024-08-13 ENCOUNTER — Encounter: Payer: Self-pay | Admitting: Dermatology

## 2024-08-13 DIAGNOSIS — L570 Actinic keratosis: Secondary | ICD-10-CM | POA: Diagnosis not present

## 2024-08-13 DIAGNOSIS — Z872 Personal history of diseases of the skin and subcutaneous tissue: Secondary | ICD-10-CM | POA: Diagnosis not present

## 2024-08-13 DIAGNOSIS — L810 Postinflammatory hyperpigmentation: Secondary | ICD-10-CM | POA: Diagnosis not present

## 2024-08-13 NOTE — Progress Notes (Signed)
 Follow-Up Visit   Subjective  Pamela [name] is a 78 y.o. female who presents for the following: AK Pt following up on Aks on her left cheek and neck that she treated with efudex . Pt got very red and raw. She finished end of oct. She feels she is looking good now.   She has persistent skin lesions, including a spot over her eyebrow and another on her lip, which did not resolve with the use of a topical cream. The lip lesion fluctuates in appearance and is more inflamed today. She did not take medication to prevent cold sores during the cream application, and the lip lesion was present before starting the cream.  She applied the cream from her left side, covering the area from her cheek down to her neck. She notes improvement in the appearance and feel of her skin, with the redness from the treatment fading over time. A spot in the corner of her eye has improved and is no longer visible.  She mentions a dark spot that has appeared. Her husband is dealing with skin cancer on his back and previously had one on his head, for which he was treated at a [facility] center.  The following portions of the chart were reviewed this encounter and updated as appropriate: medications, allergies, medical history  Review of Systems:  No other skin or systemic complaints except as noted in HPI or Assessment and Plan.  Objective  Well appearing patient in no apparent distress; mood and affect are within normal limits.  A focused examination was performed of the following areas: Cheek and neck  Relevant exam findings are noted in the Assessment and Plan.  Left Lower Vermilion Lip Erythematous thin papules/macules with gritty scale.   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 - site(s) of PreCancerous Actinic Keratosis clear today. - these may recur and new lesions may form requiring treatment to prevent transformation into skin cancer - observe  for new or changing spots and contact [facility] for appointment if occur - photoprotection with sun protective clothing; sunglasses and broad spectrum sunscreen with SPF of at least 30 + and frequent self skin exams recommended - yearly exams by a dermatologist recommended for persons with history of PreCancerous Actinic Keratoses  POST-INFLAMMATORY HYPERPIGMENTATION (PIH) Exam: hyperpigmented macules and/or patches at left cheek and temple   Post-inflammatory hyperpimentation (PIH)  is a benign condition that comes from having previous inflammation in the skin and will fade with time over months to sometimes years. Recommend daily sun protection including sunscreen SPF 30+ to sun-exposed areas. - Recommend treating any itchy or red areas on the skin quickly to prevent new areas of PIH. Once rash has cleared, treating with prescription medicines such as hydroquinone may help fade dark spots faster.    Treatment Plan:  Monitor with time AK (ACTINIC KERATOSIS) Left Lower Vermilion Lip Destruction of lesion - Left Lower Vermilion Lip Complexity: simple   Destruction method: cryotherapy   Informed consent: discussed and consent obtained   Outcome: patient tolerated procedure well with no complications   Post-procedure details: wound care instructions given    POSTINFLAMMATORY HYPERPIGMENTATION    Return in about 2 months (around 10/14/2024) for AK f/u left lower lip--will bx if does not resolve.  I, Darice Smock, CMA, am acting as scribe for RUFUS CHRISTELLA HOLY, MD.   Documentation: I have reviewed the above documentation for accuracy and completeness, and I agree with  the above.  RUFUS CHRISTELLA HOLY, MD

## 2024-08-13 NOTE — Patient Instructions (Addendum)
 Cryotherapy Aftercare  Wash gently with soap and water  everyday.   Apply Vaseline and Band-Aid daily until healed.   Important Information   Due to recent changes in healthcare laws, you may see results of your pathology and/or laboratory studies on MyChart before the doctors have had a chance to review them. We understand that in some cases there may be results that are confusing or concerning to you. Please understand that not all results are received at the same time and often the doctors may need to interpret multiple results in order to provide you with the best plan of care or course of treatment. Therefore, we ask that you please give us  2 business days to thoroughly review all your results before contacting the office for clarification. Should we see a critical lab result, you will be contacted sooner.     If You Need Anything After Your Visit   If you have any questions or concerns for your doctor, please call our main line at [phone]. If no one answers, please leave a voicemail as directed and we will return your call as soon as possible. Messages left after 4 pm will be answered the following business day.    You may also send us  a message via MyChart. We typically respond to MyChart messages within 1-2 business days.  For prescription refills, please ask your pharmacy to contact our office. Our fax number is [phone].  If you have an urgent issue when the clinic is closed that cannot wait until the next business day, you can page your doctor at the number below.     Please note that while we do our best to be available for urgent issues outside of office hours, we are not available 24/7.    If you have an urgent issue and are unable to reach us , you may choose to seek medical care at your doctor's office, retail clinic, urgent care center, or emergency room.   If you have a medical emergency, please immediately call 911 or go to the emergency department. In the event  of inclement weather, please call our main line at [phone] for an update on the status of any delays or closures.  Dermatology Medication Tips: Please keep the boxes that topical medications come in in order to help keep track of the instructions about where and how to use these. Pharmacies typically print the medication instructions only on the boxes and not directly on the medication tubes.   If your medication is too expensive, please contact our office at [phone] or send us  a message through MyChart.    We are unable to tell what your co-pay for medications will be in advance as this is different depending on your insurance coverage. However, we may be able to find a substitute medication at lower cost or fill out paperwork to get insurance to cover a needed medication.    If a prior authorization is required to get your medication covered by your insurance company, please allow us  1-2 business days to complete this process.   Drug prices often vary depending on where the prescription is filled and some pharmacies may offer cheaper prices.   The website www.goodrx.com contains coupons for medications through different pharmacies. The prices here do not account for what the cost may be with help from insurance (it may be cheaper with your insurance), but the website can give you the price if you did not use any insurance.  - You can print the  associated coupon and take it with your prescription to the pharmacy.  - You may also stop by our office during regular business hours and pick up a GoodRx coupon card.  - If you need your prescription sent electronically to a different pharmacy, notify our office through [facility] or by phone at [phone]

## 2024-08-25 ENCOUNTER — Other Ambulatory Visit: Payer: Self-pay

## 2024-08-25 DIAGNOSIS — I10 Essential (primary) hypertension: Secondary | ICD-10-CM

## 2024-08-25 DIAGNOSIS — E1140 Type 2 diabetes mellitus with diabetic neuropathy, unspecified: Secondary | ICD-10-CM

## 2024-08-25 DIAGNOSIS — I130 Hypertensive heart and chronic kidney disease with heart failure and stage 1 through stage 4 chronic kidney disease, or unspecified chronic kidney disease: Secondary | ICD-10-CM

## 2024-08-25 DIAGNOSIS — E782 Mixed hyperlipidemia: Secondary | ICD-10-CM

## 2024-08-26 ENCOUNTER — Other Ambulatory Visit

## 2024-08-26 DIAGNOSIS — E782 Mixed hyperlipidemia: Secondary | ICD-10-CM

## 2024-08-26 DIAGNOSIS — I130 Hypertensive heart and chronic kidney disease with heart failure and stage 1 through stage 4 chronic kidney disease, or unspecified chronic kidney disease: Secondary | ICD-10-CM

## 2024-08-26 DIAGNOSIS — E1140 Type 2 diabetes mellitus with diabetic neuropathy, unspecified: Secondary | ICD-10-CM

## 2024-08-26 LAB — LIPID PANEL
Chol/HDL Ratio: 1.8 ratio (ref 0.0–4.4)
Cholesterol, Total: 115 mg/dL (ref 100–199)
HDL: 63 mg/dL
LDL Chol Calc (NIH): 41 mg/dL (ref 0–99)
Triglycerides: 45 mg/dL (ref 0–149)
VLDL Cholesterol Cal: 11 mg/dL (ref 5–40)

## 2024-08-26 LAB — CBC WITH DIFFERENTIAL/PLATELET
Basophils Absolute: 0.1 x10E3/uL (ref 0.0–0.2)
Basos: 1 %
EOS (ABSOLUTE): 0.2 x10E3/uL (ref 0.0–0.4)
Eos: 3 %
Hematocrit: 38 % (ref 34.0–46.6)
Hemoglobin: 12.4 g/dL (ref 11.1–15.9)
Immature Grans (Abs): 0 x10E3/uL (ref 0.0–0.1)
Immature Granulocytes: 0 %
Lymphocytes Absolute: 2.2 x10E3/uL (ref 0.7–3.1)
Lymphs: 32 %
MCH: 30.9 pg (ref 26.6–33.0)
MCHC: 32.6 g/dL (ref 31.5–35.7)
MCV: 95 fL (ref 79–97)
Monocytes Absolute: 0.8 x10E3/uL (ref 0.1–0.9)
Monocytes: 12 %
Neutrophils Absolute: 3.7 x10E3/uL (ref 1.4–7.0)
Neutrophils: 52 %
Platelets: 338 x10E3/uL (ref 150–450)
RBC: 4.01 x10E6/uL (ref 3.77–5.28)
RDW: 12.5 % (ref 11.7–15.4)
WBC: 7 x10E3/uL (ref 3.4–10.8)

## 2024-08-26 LAB — COMPREHENSIVE METABOLIC PANEL WITH GFR
ALT: 24 IU/L (ref 0–32)
AST: 26 IU/L (ref 0–40)
Albumin: 4 g/dL (ref 3.8–4.8)
Alkaline Phosphatase: 106 IU/L (ref 49–135)
BUN/Creatinine Ratio: 24 (ref 12–28)
BUN: 32 mg/dL — ABNORMAL HIGH (ref 8–27)
Bilirubin Total: 0.4 mg/dL (ref 0.0–1.2)
CO2: 25 mmol/L (ref 20–29)
Calcium: 9.6 mg/dL (ref 8.7–10.3)
Chloride: 97 mmol/L (ref 96–106)
Creatinine, Ser: 1.33 mg/dL — ABNORMAL HIGH (ref 0.57–1.00)
Globulin, Total: 2.5 g/dL (ref 1.5–4.5)
Glucose: 73 mg/dL (ref 70–99)
Potassium: 4.2 mmol/L (ref 3.5–5.2)
Sodium: 135 mmol/L (ref 134–144)
Total Protein: 6.5 g/dL (ref 6.0–8.5)
eGFR: 41 mL/min/1.73 — ABNORMAL LOW

## 2024-08-26 LAB — HEMOGLOBIN A1C
Est. average glucose Bld gHb Est-mCnc: 174 mg/dL
Hgb A1c MFr Bld: 7.7 % — ABNORMAL HIGH (ref 4.8–5.6)

## 2024-08-30 ENCOUNTER — Ambulatory Visit: Admitting: Family Medicine

## 2024-08-30 ENCOUNTER — Encounter: Payer: Self-pay | Admitting: Family Medicine

## 2024-08-30 VITALS — BP 164/60 | HR 50 | Temp 97.0°F | Resp 16 | Ht 61.0 in | Wt 147.0 lb

## 2024-08-30 DIAGNOSIS — M5431 Sciatica, right side: Secondary | ICD-10-CM | POA: Diagnosis not present

## 2024-08-30 DIAGNOSIS — R197 Diarrhea, unspecified: Secondary | ICD-10-CM

## 2024-08-30 DIAGNOSIS — Z794 Long term (current) use of insulin: Secondary | ICD-10-CM

## 2024-08-30 DIAGNOSIS — M1711 Unilateral primary osteoarthritis, right knee: Secondary | ICD-10-CM | POA: Diagnosis not present

## 2024-08-30 DIAGNOSIS — R001 Bradycardia, unspecified: Secondary | ICD-10-CM

## 2024-08-30 DIAGNOSIS — R42 Dizziness and giddiness: Secondary | ICD-10-CM | POA: Diagnosis not present

## 2024-08-30 DIAGNOSIS — E1140 Type 2 diabetes mellitus with diabetic neuropathy, unspecified: Secondary | ICD-10-CM

## 2024-08-30 DIAGNOSIS — I10 Essential (primary) hypertension: Secondary | ICD-10-CM | POA: Diagnosis not present

## 2024-08-30 DIAGNOSIS — I480 Paroxysmal atrial fibrillation: Secondary | ICD-10-CM | POA: Diagnosis not present

## 2024-08-30 DIAGNOSIS — I130 Hypertensive heart and chronic kidney disease with heart failure and stage 1 through stage 4 chronic kidney disease, or unspecified chronic kidney disease: Secondary | ICD-10-CM | POA: Diagnosis not present

## 2024-08-30 MED ORDER — PREDNISONE 20 MG PO TABS: 20.0000 mg | ORAL_TABLET | Freq: Every day | ORAL | 0 refills | Status: DC

## 2024-08-30 NOTE — Progress Notes (Signed)
 "  Subjective:  Patient ID: Pamela [name], female    DOB: 1945/11/01  Age: 78 y.o. MRN: [id number]  Chief Complaint  Patient presents with   Medical Management of Chronic Issues    HPI: Discussed the use of AI scribe software for clinical note transcription with the patient, who gave verbal consent to proceed.  History of Present Illness Pamela [name] is a 78 year old female who presents with right leg pain and diarrhea.  Right lower extremity pain - Burning pain in the right leg, especially when standing up quickly - History of arthritis - Scheduled to see orthopedic specialist in January - Uses a walker, resulting in a hunched posture. Would like an upright walker. - History of back surgery  Diarrhea and gastrointestinal symptoms - Diarrhea for approximately one month - Loose, explosive bowel movements occurring daily, up to three or four times per day - Diarrhea relieves abdominal and hip pain - Abdominal pain precedes bowel movements - No bladder symptoms - Most fluid intake appears to exit through bowels rather than urine - Current medications include methenamine , magnesium  oxide 800 mg twice daily, Senokot for constipation, and recent initiation of flecainide  1-2 months.  Orthostatic symptoms - Episodes of dizziness and lightheadedness upon standing - Associated headaches and blurry vision lasting a few minutes - No recent falls, but occasionally needs to sit back down due to dizziness  Respiratory and cardiovascular symptoms - Occasional shortness of breath with exertion, such as sweeping the kitchen and hallway - Requires sitting down multiple times during activity - No chest pain  Appetite and hydration - Decreased appetite - Constant thirst, drinks fluids regularly  Glycemic control - Fluctuating blood sugar levels despite endocrinology management - Current Toujeo  insulin  regimen: 6 units daily and Novolog  max 30 U daily based on  glucose       06/05/2024   10:00 AM 03/29/2024   10:25 AM 04/17/2023    8:55 AM 03/30/2023    2:14 PM 12/14/2022    9:06 AM  Depression screen PHQ 2/9  Decreased Interest 0 0 1 0 0  Down, Depressed, Hopeless 0 0 0 0 0  PHQ - 2 Score 0 0 1 0 0  Altered sleeping  2 3    Tired, decreased energy  3 2    Change in appetite  0 0    Feeling bad or failure about yourself   1 0    Trouble concentrating  0 1    Moving slowly or fidgety/restless  2 1    Suicidal thoughts  0 0    PHQ-9 Score  8  8     Difficult doing work/chores  Somewhat difficult Somewhat difficult       Data saved with a previous flowsheet row definition        03/29/2024   10:25 AM  Fall Risk   Falls in the past year? 1  Number falls in past yr: 1  Injury with Fall? 1   Risk for fall due to : Impaired balance/gait  Follow up Falls evaluation completed     Data saved with a previous flowsheet row definition    Patient Care Team: Sherre Clapper, MD as PCP - General (Internal Medicine) Inocencio Soyla Lunger, MD as PCP - Electrophysiology (Cardiology) Bernie Lamar PARAS, MD as PCP - Cardiology (Cardiology) Ezzard Valaria LABOR, MD as Consulting Physician (Oncology) Arloa Brunner, OD (Optometry) Ortho, Emerge (Specialist) Darlean Ozell NOVAK, MD as Consulting Physician (Pulmonary Disease) Ethyl,  Lonni BRAVO, MD (Inactive) as Consulting Physician (Otolaryngology) Kassie Mallick, MD (Inactive) as Consulting Physician (Endocrinology) Ishmael Slough, MD as Consulting Physician (Rheumatology) Armbruster, Elspeth SQUIBB, MD as Consulting Physician (Gastroenterology) Arloa Brunner, OD (Optometry) Bernie Lamar PARAS, MD as Consulting Physician (Cardiology) [facility], Donell Cardinal, MD as Attending Physician (Endocrinology)   Review of Systems  Constitutional:  Positive for appetite change (decreased.) and fatigue. Negative for chills and fever.  HENT:  Negative for congestion, ear pain and sore throat.   Respiratory:  Positive  for shortness of breath (occasionally and with exertion.). Negative for cough.   Cardiovascular:  Negative for chest pain.  Gastrointestinal:  Positive for abdominal pain (resolves with bm. hip pain resolves with bm.) and diarrhea (loose explosive. like oatmeal.  x 1 month.  usually 3-4 times per day.). Negative for constipation, nausea and vomiting.  Endocrine: Positive for polydipsia. Negative for polyphagia and polyuria.  Genitourinary:  Negative for dysuria and urgency.  Musculoskeletal:  Positive for arthralgias and back pain (down right leg). Negative for myalgias.  Neurological:  Positive for light-headedness (when gets up.  this has been on going.). Negative for dizziness and headaches.  Psychiatric/Behavioral:  Positive for dysphoric mood. The patient is not nervous/anxious.     Medications Ordered Prior to Encounter[1] Past Medical History:  Diagnosis Date   Abnormal CT of the chest 07/20/2023   Acquired thrombophilia 06/27/2021   Acute combined systolic and diastolic heart failure (HCC) 12/08/2023   Acute cystitis without hematuria 06/12/2022   Acute heart failure with mildly reduced ejection fraction (HFmrEF, 41-49%) (HCC) 12/07/2023   Acute LUQ pain 02/12/2024   Acute on chronic diastolic (congestive) heart failure (HCC) 12/07/2023   Anemia in chronic kidney disease 09/06/2023   Anticoagulated on Coumadin     chronic--- managed by hematology/ oncology,   Arrhythmia    Asthma    Asthma, mild intermittent    followed by pcp--- per pt last exacerbation winter 2021 w/ acute bronchitis   Athscl heart disease of native coronary artery w/o ang pctrs 09/06/2023   Atrial fibrillation [I48.91] 10/01/2014   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, NYHA class 1 (HCC) 05/23/2011   Bilateral leg cramps    Blood dyscrasia    Bunion 08/23/2021   Cervical radiculopathy 07/19/2021   Cervical spondylosis 09/06/2023   Chest pain at rest 01/22/2024   Chronic constipation     Chronic diastolic congestive heart failure (HCC) 12/09/2020   Chronic pain of left ankle 03/31/2024   CKD (chronic kidney disease), stage III (HCC)    CKD stage 3a, GFR 45-59 ml/min (HCC) 11/05/2023   Closed bimalleolar fracture of left ankle 02/26/2020   Closed fracture of fifth metatarsal bone of left foot 12/06/2021   Closed fracture of fourth metatarsal bone 05/07/2021   Clotting disorder 1991   Protein S Deficency   Compression fracture of L1 lumbar vertebra (HCC) 12/17/2022   DDD (degenerative disc disease), cervical    w/ spondylosis,  per pt last steroid injection 06/ 2022   DDD (degenerative disc disease), lumbosacral    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   DJD (degenerative joint disease)    DVT (deep venous thrombosis) (HCC) 07/30/2013   Dyspnea    occasionally   Dyspnea on exertion 03/03/2013   Dysrhythmia    Dysuria 04/17/2023   Elevated rheumatoid factor 05/03/2024   Encntr for surgical aftcr following surgery on the circ sys 09/06/2023   Encounter for immunization 07/20/2023   Encounter for Medicare annual wellness exam 04/04/2024   Encounter  for orthopedic follow-up care 03/10/2020   Flank pain 02/12/2024   GERD (gastroesophageal reflux disease)    Hammer toe 08/23/2021   Heart murmur    History of cardiac murmur as a child    History of DVT of lower extremity    left lower extremity in 1980s, fell when bowling   History of falling 09/06/2023   History of pulmonary embolus (PE) 1993   per pt left lung post op 2 wks cholecystectomy   History of rheumatic fever as a child    per last echo 12-31-2019 no valvular issues   History of TIA (transient ischemic attack)    2014 and 2018 or 2019,  per pt no residual   HTN (hypertension)    followed by pcp   Hyperkalemia 03/31/2024   Hyperlipidemia    Hypertensive heart and chronic kidney disease with heart failure and stage 1 through stage 4 chronic kidney disease, or unspecified chronic kidney disease (HCC)  12/12/2023   Hyponatremia 02/06/2019   Na 126 on no obvious meds to cause it with last na 133 in 2014      Hypothyroidism    IDA (iron deficiency anemia)    Joint pain 10/24/2021   Low back strain 08/09/2018   Lumbar radiculopathy 08/13/2021   Macular degeneration of both eyes    Mild obstructive sleep apnea    per pt dx 2017 tried to uses cpap but intolerant   Mixed incontinence urge and stress    urologist--- dr marda   Muscle pain 10/24/2021   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   OA (osteoarthritis) 07/06/2018   Osteoporosis    Other cardiomyopathies (HCC) 09/06/2023   Other instability, left foot 03/28/2024   Other microscopic hematuria 03/31/2024   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   PAF (paroxysmal atrial fibrillation) (HCC) 10/01/2014   cardiologist--- dr dub tobb;   cardiac cath 02-18-2013 normal coronaries arteries, ef 50%, cath done since echo showed ef 30-35%; nucleat stress study 04/ 2020 normal , normal echo 04/ 2021,  event monitor 09-14-2020 rare ST/AT variable block   Pain in left foot 12/17/2021   Pain of right hip joint 08/11/2022   Personal history of pneumonia (recurrent) 09/06/2023   Personal history of urinary (tract) infections 09/06/2023   Pneumonia    PONV (postoperative nausea and vomiting)    Presence of left artificial knee joint 09/06/2023   Protein S deficiency    followed by hemotology/ oncology-- dr d. ezzard ([facility]) dx 1980s;  prior DVT left lower leg 1980s and left lung PE 1993; chronic  coumadin  since 1980s   PVC's (premature ventricular contractions)    followed by cardiology   S/P cardiac catheterization 02/2013   Normal coronaries; low normal EF at 50%   Skin cancer screening 02/12/2024   Solitary pulmonary nodule 09/06/2023   Solitary pulmonary nodule on lung CT 02/06/2019   First noted 01/13/2014 > no change as of 12/21/2018   Spinal stenosis  of lumbar region 07/19/2021   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/22/2022   Spondylolisthesis, lumbar region 08/09/2018   Sprain of right ankle 10/13/2023   Stage 3b chronic kidney disease (HCC) 12/17/2022   Stroke (HCC)    TIA in 2018 or 2019   Syncope 01/22/2024   Transient ischemic attack 07/30/2013   Traumatic arthritis of left ankle 02/12/2024   Type 2 diabetes mellitus treated with insulin  (HCC)    endocrilogist--- whitney reardon  NP     (03-10-2021  pt continuously checks blood sugar throughout the day w/ Libre, fasting sugar --- 69--200)   Type 2 diabetes mellitus with diabetic neuropathy, with long-term current use of insulin  (HCC) 06/15/2021   Unilateral primary osteoarthritis, right knee 07/06/2018   Urinary frequency 02/12/2024   UTI (urinary tract infection) 01/23/2024   UTI (urinary tract infection) due to Enterococcus 01/22/2024   Ventricular premature depolarization 09/06/2023   Wears glasses    Wears hearing aid in both ears    Past Surgical History:  Procedure Laterality Date   ABDOMINAL HYSTERECTOMY     BLEPHAROPLASTY     CARDIAC CATHETERIZATION  02/18/2013   @ [facility]  by Dr Jordan;  normal coronaries w/ preserved LVF, ef 50%;   previous cath 2001 normal ef 65%   CARPAL TUNNEL RELEASE Bilateral 1994   CATARACT EXTRACTION W/ INTRAOCULAR LENS IMPLANT Bilateral 2017   CHOLECYSTECTOMY     CHOLECYSTECTOMY, LAPAROSCOPIC  1993   COLONOSCOPY     EAR BIOPSY Left    FINGER SURGERY Left 2018   thumb   FOOT SURGERY     FOOT TENDON SURGERY Right    early 2000s   FRACTURE SURGERY Left 02/13/2020   left femur   HAND SURGERY Right 04/2022   HARDWARE REMOVAL Left 03/12/2021   Procedure: HARDWARE REMOVAL;  Surgeon: Sharl Selinda Dover, MD;  Location: [facility];  Service: Orthopedics;  Laterality: Left;  60 MINS   JOINT REPLACEMENT     LEFT HEART CATH AND CORONARY ANGIOGRAPHY N/A 12/07/2023   Procedure: LEFT HEART CATH AND  CORONARY ANGIOGRAPHY;  Surgeon: Verlin Lonni BIRCH, MD;  Location: [facility] INVASIVE CV LAB;  Service: Cardiovascular;  Laterality: N/A;   LUMBAR LAMINECTOMY/DECOMPRESSION MICRODISCECTOMY N/A 12/22/2022   Procedure: Lumbar Two-Lumbar Three, Lumbar Three-Lumbar Four Lumbar Decompression;  Surgeon: Colon Shove, MD;  Location: [facility] OR;  Service: Neurosurgery;  Laterality: N/A;  RM 20 3C   ORIF ANKLE FRACTURE Left 02/26/2020   Procedure: OPEN REDUCTION INTERNAL FIXATION (ORIF) ANKLE FRACTURE;  Surgeon: Sharl Selinda Dover, MD;  Location: [facility] OR;  Service: Orthopedics;  Laterality: Left;  90 mins   TONSILLECTOMY AND ADENOIDECTOMY Bilateral    TOTAL KNEE ARTHROPLASTY Left 03/2005   TOTAL VAGINAL HYSTERECTOMY  1988   per pt still has ovaries    Family History  Problem Relation Age of Onset   Cirrhosis Mother    Antithrombin III deficiency Mother        multiple emboli   Ulcerative colitis Mother    Anemia Mother    Heart attack Mother        Anti thrombin  3, Cirrhosis of liver Short Bowel syndrome. Passed age 78 as result of blood clots/obstruction of bowels and gangreen.   Diabetes Father    Coronary artery disease Father    Heart attack Father        Dempsey- passed shortly after bypass surgery and Kidney failure. Age 110   Hypertension Father        Both Father ,brother and Sister Heart disease, diabetes, also   Kidney disease Father    Hypertension Sister        Heart disease and cancer also.   Diabetes Sister    Heart attack Sister        Ivy) has Protein S. deficency, Diabetis, Micro Vascular Disease, Heart Trouble with 4 Stints. Surgical removal breast cancer   Protein S deficiency Daughter    Heart attack Brother  Passed as result of Lung Cancer and Heart trouble   Hypertension Brother        Heart disease, cancer,  also   Lung cancer Brother    Stroke Neg Hx    Colitis Neg Hx    Colon polyps Neg Hx    Esophageal cancer Neg Hx    Liver cancer Neg Hx    Pancreatic  cancer Neg Hx    Rectal cancer Neg Hx    Stomach cancer Neg Hx    Breast cancer Neg Hx    Social History   Socioeconomic History   Marital status: Married    Spouse name: Fairy   Number of children: Not on file   Years of education: Not on file   Highest education level: Associate degree: occupational, scientist, product/process development, or vocational program  Occupational History   Not on file  Tobacco Use   Smoking status: Never   Smokeless tobacco: Never  Vaping Use   Vaping status: Never Used  Substance and Sexual Activity   Alcohol use: No   Drug use: Never   Sexual activity: Not Currently    Birth control/protection: Surgical    Comment: Hysterectomy  Other Topics Concern   Not on file  Social History Narrative   Lives with spouse in [place].  2 grown daughters.   Retired print production planner     Social Drivers of Health   Tobacco Use: Low Risk (08/30/2024)   Patient History    Smoking Tobacco Use: Never    Smokeless Tobacco Use: Never    Passive Exposure: Not on file  Financial Resource Strain: Low Risk (08/26/2024)   Overall Financial Resource Strain (CARDIA)    Difficulty of Paying Living Expenses: Not very hard  Food Insecurity: No Food Insecurity (08/26/2024)   Epic    Worried About Radiation Protection Practitioner of Food in the Last Year: Never true    Ran Out of Food in the Last Year: Never true  Transportation Needs: No Transportation Needs (08/26/2024)   Epic    Lack of Transportation (Medical): No    Lack of Transportation (Non-Medical): No  Physical Activity: Insufficiently Active (08/26/2024)   Exercise Vital Sign    Days of Exercise per Week: 1 day    Minutes of Exercise per Session: 30 min  Stress: Stress Concern Present (08/26/2024)   [employer] of Occupational Health - Occupational Stress Questionnaire    Feeling of Stress: To some extent  Social Connections: Moderately Integrated (08/26/2024)   Social Connection and Isolation Panel    Frequency of Communication with Friends  and Family: More than three times a week    Frequency of Social Gatherings with Friends and Family: Once a week    Attends Religious Services: More than 4 times per year    Active Member of Clubs or Organizations: No    Attends Engineer, Structural: Not on file    Marital Status: Married  Depression (PHQ2-9): Low Risk (06/05/2024)   Depression (PHQ2-9)    PHQ-2 Score: 0  Recent Concern: Depression (PHQ2-9) - Medium Risk (03/29/2024)   Depression (PHQ2-9)    PHQ-2 Score: 8  Alcohol Screen: Low Risk (03/30/2023)   Alcohol Screen    Last Alcohol Screening Score (AUDIT): 0  Housing: Low Risk (08/26/2024)   Epic    Unable to Pay for Housing in the Last Year: No    Number of Times Moved in the Last Year: 0    Homeless in the Last Year: No  Utilities: Not  At Risk (01/23/2024)   AHC Utilities    Threatened with loss of utilities: No  Health Literacy: Adequate Health Literacy (03/30/2023)   B1300 Health Literacy    Frequency of need for help with medical instructions: Never    Objective:  BP (!) 164/60 Comment: standing up  Pulse (!) 50   Temp (!) 97 F (36.1 C)   Resp 16   Ht 5' 1 (1.549 m)   Wt 147 lb (66.7 kg)   SpO2 98%   BMI 27.78 kg/m      08/30/2024   11:10 AM 08/30/2024   11:05 AM 08/30/2024    9:27 AM  BP/Weight  Systolic BP 164 186 140  Diastolic BP 60 70 60  Wt. (Lbs)   147  BMI   27.78 kg/m2    Physical Exam Vitals reviewed.  Constitutional:      Appearance: Normal appearance. She is normal weight.  Neck:     Vascular: No carotid bruit.  Cardiovascular:     Rate and Rhythm: Bradycardia present. Rhythm irregular.     Pulses: Normal pulses.     Heart sounds: Normal heart sounds.  Pulmonary:     Effort: Pulmonary effort is normal. No respiratory distress.     Breath sounds: Normal breath sounds.  Abdominal:     General: Abdomen is flat. Bowel sounds are normal.     Palpations: Abdomen is soft.     Tenderness: There is abdominal tenderness (mild  RLQ and Rt periumbilical region.).  Musculoskeletal:        General: Tenderness (right lumbar. Positive SLR on right. Negative SLR on left.) present.     Right lower leg: No edema.     Left lower leg: No edema.  Neurological:     Mental Status: She is alert and oriented to person, place, and time.  Psychiatric:        Mood and Affect: Mood normal.        Behavior: Behavior normal.      Diabetic foot exam was performed with the following findings:   No deformities, ulcerations, or other skin breakdown Normal sensation of 10g monofilament Intact posterior tibialis and dorsalis pedis pulses      Lab Results  Component Value Date   WBC 7.0 08/26/2024   HGB 12.4 08/26/2024   HCT 38.0 08/26/2024   PLT 338 08/26/2024   GLUCOSE 73 08/26/2024   CHOL 115 08/26/2024   TRIG 45 08/26/2024   HDL 63 08/26/2024   LDLCALC 41 08/26/2024   ALT 24 08/26/2024   AST 26 08/26/2024   NA 135 08/26/2024   K 4.2 08/26/2024   CL 97 08/26/2024   CREATININE 1.33 (H) 08/26/2024   BUN 32 (H) 08/26/2024   CO2 25 08/26/2024   TSH 3.060 06/17/2024   INR 1.3 (H) 06/05/2024   HGBA1C 7.7 (H) 08/26/2024    Results for orders placed or performed in visit on 08/26/24  Hemoglobin A1c   Collection Time: 08/26/24  8:04 AM  Result Value Ref Range   Hgb A1c MFr Bld 7.7 (H) 4.8 - 5.6 %   Est. average glucose Bld gHb Est-mCnc 174 mg/dL  Lipid panel   Collection Time: 08/26/24  8:04 AM  Result Value Ref Range   Cholesterol, Total 115 100 - 199 mg/dL   Triglycerides 45 0 - 149 mg/dL   HDL 63 >60 mg/dL   VLDL Cholesterol Cal 11 5 - 40 mg/dL   LDL Chol Calc (NIH) 41 0 - 99  mg/dL   Chol/HDL Ratio 1.8 0.0 - 4.4 ratio  CBC with Differential/Platelet   Collection Time: 08/26/24  8:04 AM  Result Value Ref Range   WBC 7.0 3.4 - 10.8 x10E3/uL   RBC 4.01 3.77 - 5.28 x10E6/uL   Hemoglobin 12.4 11.1 - 15.9 g/dL   Hematocrit 61.9 65.9 - 46.6 %   MCV 95 79 - 97 fL   MCH 30.9 26.6 - 33.0 pg   MCHC 32.6 31.5 -  35.7 g/dL   RDW 87.4 88.2 - 84.5 %   Platelets 338 150 - 450 x10E3/uL   Neutrophils 52 Not Estab. %   Lymphs 32 Not Estab. %   Monocytes 12 Not Estab. %   Eos 3 Not Estab. %   Basos 1 Not Estab. %   Neutrophils Absolute 3.7 1.4 - 7.0 x10E3/uL   Lymphocytes Absolute 2.2 0.7 - 3.1 x10E3/uL   Monocytes Absolute 0.8 0.1 - 0.9 x10E3/uL   EOS (ABSOLUTE) 0.2 0.0 - 0.4 x10E3/uL   Basophils Absolute 0.1 0.0 - 0.2 x10E3/uL   Immature Granulocytes 0 Not Estab. %   Immature Grans (Abs) 0.0 0.0 - 0.1 x10E3/uL  Comprehensive metabolic panel with GFR   Collection Time: 08/26/24  8:04 AM  Result Value Ref Range   Glucose 73 70 - 99 mg/dL   BUN 32 (H) 8 - 27 mg/dL   Creatinine, Ser 8.66 (H) 0.57 - 1.00 mg/dL   eGFR 41 (L) >40 fO/fpw/8.26   BUN/Creatinine Ratio 24 12 - 28   Sodium 135 134 - 144 mmol/L   Potassium 4.2 3.5 - 5.2 mmol/L   Chloride 97 96 - 106 mmol/L   CO2 25 20 - 29 mmol/L   Calcium  9.6 8.7 - 10.3 mg/dL   Total Protein 6.5 6.0 - 8.5 g/dL   Albumin  4.0 3.8 - 4.8 g/dL   Globulin, Total 2.5 1.5 - 4.5 g/dL   Bilirubin Total 0.4 0.0 - 1.2 mg/dL   Alkaline Phosphatase 106 49 - 135 IU/L   AST 26 0 - 40 IU/L   ALT 24 0 - 32 IU/L   *Note: Due to a large number of results and/or encounters for the requested time period, some results have not been displayed. A complete set of results can be found in Results Review.  .  Assessment & Plan:   Assessment & Plan Diarrhea of presumed infectious origin Diarrhea likely due to magnesium  oxide and Senokot use. Dehydration suspected from increased bowel movements. - Stop Senokot. - Decrease magnesium  oxide to 400 mg twice a day. - Ordered stool studies. Orders:   Stool culture   Clostridium Difficile by PCR   Ova and parasite examination   Fecal leukocytes  Right sided sciatica Right leg pain suggests lumbar radiculopathy or sciatica. Orthopedic evaluation pending. - Continue with scheduled appointment with orthopedic specialist. -  order am upright walker. Orders:   predniSONE  (DELTASONE ) 20 MG tablet; Take 1 tablet (20 mg total) by mouth daily with breakfast.  Bradycardia Decrease carvedilol  12.5 mg twice daily   Orders:   EKG 12-Lead  Lightheadedness May be secondary to bradycardia.  Decreased carvedilol  12.5 mg twice daily.     Paroxysmal atrial fibrillation (HCC) Decrease carvedilol  12.5 mg twice daily  Continue flecainide  and eliquis     Type 2 diabetes mellitus with diabetic neuropathy, with long-term current use of insulin  (HCC) Control: fair. Sees endocrinology.  Recommend check sugars before meals and before bed. Has CGM. Recommend check feet daily. Recommend annual eye exams. Medicines: Toujeo   6 units daily, NovoLog  4 unites for breakfast and lunch, 5 units supper, trulicity  0.75 weekly  Continue to work on eating a healthy diet and exercise.  Labs reviewed today     Hypertensive heart and chronic kidney disease with heart failure and stage 1 through stage 4 chronic kidney disease, or unspecified chronic kidney disease (HCC) Although hypertension is too high, lightheadedness and orthostatic bp is of more concern. Continue amlodipine  10 mg daily, carvedilol  12.5 mg twice daily, lasix  20 mg daily, olmesartan  40 mg daily, and kerendia  10 mg daily.       Unilateral primary osteoarthritis, right knee Chronic knee pain due to osteoarthritis. - Prescribed an upright walker for mobility and posture.    Uncontrolled hypertension  Orders:   carvedilol  (COREG ) 12.5 MG tablet; Take 1 tablet (12.5 mg total) by mouth 2 (two) times daily.   Body mass index is 27.78 kg/m.   Meds ordered this encounter  Medications   predniSONE  (DELTASONE ) 20 MG tablet    Sig: Take 1 tablet (20 mg total) by mouth daily with breakfast.    Dispense:  5 tablet    Refill:  0    Orders Placed This Encounter  Procedures   Stool culture   Clostridium Difficile by PCR   Ova and parasite examination   Fecal  leukocytes   EKG 12-Lead   EKG     Total time spent on today's visit was 40 minutes, including both face-to-face time and nonface-to-face time personally spent on review of chart (labs and imaging), discussing labs and goals, discussing further work-up, treatment options, referrals to specialist if needed, reviewing outside records of pertinent, answering patient's questions, and coordinating care.   Follow-up: Return in about 2 weeks (around 09/13/2024) for Dr. Ivin chronic follow up.  An After Visit Summary was printed and given to the patient.  Abigail Free, MD [facility] [phone]     [1]  Current Outpatient Medications on File Prior to Visit  Medication Sig Dispense Refill   Accu-Chek Softclix Lancets lancets SMARTSIG:Topical 1-4 Times Daily     amLODipine  (NORVASC ) 10 MG tablet Take 10 mg by mouth daily.     aspirin  EC (ASPIRIN  LOW DOSE) 81 MG tablet Take 1 tablet (81 mg total) by mouth daily. Swallow whole. 90 tablet 3   atorvastatin  (LIPITOR ) 80 MG tablet TAKE 1 TABLET(80 MG) BY MOUTH DAILY 90 tablet 3   blood glucose meter kit and supplies KIT Dispense based on patient and insurance preference. Use up to four times daily as directed. 1 each 0   carboxymethylcellulose (REFRESH PLUS) 0.5 % SOLN Place 1 drop into both eyes 3 (three) times daily as needed (dry eyes).     carvedilol  (COREG ) 12.5 MG tablet Take 1.5 tablets (18.75 mg total) by mouth 2 (two) times daily. 60 tablet 3   Cholecalciferol (VITAMIN D3) 50 MCG (2000 UT) TABS Take 4,000 Units by mouth in the morning and at bedtime.     cyanocobalamin  (VITAMIN B12) 1000 MCG tablet Take 1,000 mcg by mouth daily with lunch.     ELIQUIS  5 MG TABS tablet TAKE 1 TABLET(5 MG) BY MOUTH TWICE DAILY 120 tablet 1   ferrous sulfate  325 (65 FE) MG tablet Take 325 mg by mouth daily with lunch.     Finerenone  (KERENDIA ) 10 MG TABS Take 1 tablet (10 mg total) by mouth daily. 90 tablet 1   flecainide  (TAMBOCOR ) 50 MG tablet  Take 1 tablet (50 mg total) by mouth 2 (  two) times daily. 60 tablet 3   fluticasone  (FLONASE ) 50 MCG/ACT nasal spray Place 2 sprays into both nostrils daily. 16 g 6   furosemide  (LASIX ) 20 MG tablet Take 1 tablet (20 mg total) by mouth daily. TAKE 1 TABLET(20 MG) BY MOUTH DAILY 90 tablet 0   GEMTESA  75 MG TABS Take 1 tablet (75 mg total) by mouth at bedtime. 90 tablet 3   glucose blood (ACCU-CHEK GUIDE TEST) test strip Use to check blood sugar up to 4 times per day 400 each 5   insulin  aspart (NOVOLOG  FLEXPEN) 100 UNIT/ML FlexPen Max daily 30 units 30 mL 3   insulin  glargine, 1 Unit Dial , (TOUJEO  SOLOSTAR) 300 UNIT/ML Solostar Pen Inject 8 Units into the skin daily in the afternoon. (Patient taking differently: Inject 6 Units into the skin daily in the afternoon.)     Insulin  Pen Needle 32G X 4 MM MISC 1 Device by Does not apply route in the morning, at noon, in the evening, and at bedtime. 400 each 3   magnesium  oxide (MAG-OX) 400 (240 Mg) MG tablet TAKE 2 TABLETS(800 MG) BY MOUTH TWICE DAILY 120 tablet 2   methenamine  (HIPREX ) 1 g tablet Take 1 tablet (1 g total) by mouth 2 (two) times daily with a meal. 60 tablet 11   olmesartan  (BENICAR ) 40 MG tablet Take 1 tablet (40 mg total) by mouth daily. 90 tablet 1   ondansetron  (ZOFRAN -ODT) 8 MG disintegrating tablet Take 1 tablet (8 mg total) by mouth every 8 (eight) hours as needed for nausea or vomiting. 30 tablet 2   pantoprazole  (PROTONIX ) 40 MG tablet TAKE 1 TABLET(40 MG) BY MOUTH EVERY MORNING 90 tablet 1   pyridoxine  (B-6) 100 MG tablet Take 100 mg by mouth daily with lunch.     sennosides-docusate sodium  (SENOKOT-S) 8.6-50 MG tablet Take 1 tablet by mouth every other day.     sodium chloride  (OCEAN) 0.65 % SOLN nasal spray Place 1 spray into both nostrils as needed for congestion.     zolpidem  (AMBIEN ) 5 MG tablet TAKE 1 TABLET(5 MG) BY MOUTH AT BEDTIME AS NEEDED FOR SLEEP 30 tablet 5   No current facility-administered medications on file prior  to visit.   "

## 2024-08-30 NOTE — Patient Instructions (Addendum)
" °  VISIT SUMMARY: During your visit, we discussed your right leg pain, chronic diarrhea, dizziness upon standing, and other health concerns. We made some adjustments to your medications and provided recommendations to help manage your symptoms.  YOUR PLAN: CHRONIC DIARRHEA: Your diarrhea is likely due to the use of magnesium  oxide and Senokot. -Stop taking Senokot. -Reduce magnesium  oxide to 400 mg twice a day. -We have ordered stool studies to further investigate the cause.  ORTHOSTATIC HYPOTENSION: Your dizziness and lightheadedness upon standing suggest orthostatic hypotension. -We checked your blood pressure in three different positions to assess this.  LUMBAR RADICULOPATHY: Your right leg pain may be due to lumbar radiculopathy or sciatica. -Continue with your scheduled appointment with the orthopedic specialist in January. -Start on prednisone  20 mg daily. Take with food.   OSTEOARTHRITIS OF RIGHT KNEE: Your chronic knee pain is due to osteoarthritis. -Continue using the walker for mobility and to help with your posture.  CHRONIC KIDNEY DISEASE: Your worsening creatinine levels may be due to dehydration from diarrhea. -Increase your fluid intake to stay hydrated.  TYPE 2 DIABETES MELLITUS, INSULIN  DEPENDENT: Your blood sugar levels are being managed by your endocrinologist with insulin  adjustments. -Continue with your current insulin  regimen and make adjustments as needed.  HYPERTENSION: Your high blood pressure is controlled with your current medications. -Continue taking amlodipine  and olmesartan  as prescribed.  HYPERLIPIDEMIA: Your cholesterol levels are being managed with statin therapy. -Continue your current statin therapy.  ATRIAL FIBRILLATION: You have recently started taking flecainide  for your atrial fibrillation. -Continue your current anticoagulation and antiarrhythmic therapy.  BRADYCARDIA: - decrease carvedilol  12.5 mg twice daily.   [facility]: -  Upright walker - will order.                       Contains text generated by [employer].                                 Contains text generated by [employer]bridge.   "

## 2024-09-01 DIAGNOSIS — R001 Bradycardia, unspecified: Secondary | ICD-10-CM | POA: Insufficient documentation

## 2024-09-01 DIAGNOSIS — R197 Diarrhea, unspecified: Secondary | ICD-10-CM | POA: Insufficient documentation

## 2024-09-01 DIAGNOSIS — M5431 Sciatica, right side: Secondary | ICD-10-CM | POA: Insufficient documentation

## 2024-09-01 MED ORDER — MAGNESIUM OXIDE -MG SUPPLEMENT 400 (240 MG) MG PO TABS: 800.0000 mg | ORAL_TABLET | Freq: Every day | ORAL | Status: AC

## 2024-09-01 MED ORDER — CARVEDILOL 12.5 MG PO TABS: 12.5000 mg | ORAL_TABLET | Freq: Two times a day (BID) | ORAL | Status: DC

## 2024-09-01 NOTE — Assessment & Plan Note (Signed)
 Chronic knee pain due to osteoarthritis. - Prescribed an upright walker for mobility and posture.

## 2024-09-01 NOTE — Assessment & Plan Note (Signed)
 Decrease carvedilol  12.5 mg twice daily   Orders:   EKG 12-Lead

## 2024-09-01 NOTE — Assessment & Plan Note (Signed)
 Control: fair. Sees endocrinology.  Recommend check sugars before meals and before bed. Has CGM. Recommend check feet daily. Recommend annual eye exams. Medicines: Toujeo  6 units daily, NovoLog  4 unites for breakfast and lunch, 5 units supper, trulicity  0.75 weekly  Continue to work on eating a healthy diet and exercise.  Labs reviewed today

## 2024-09-01 NOTE — Assessment & Plan Note (Signed)
 Right leg pain suggests lumbar radiculopathy or sciatica. Orthopedic evaluation pending. - Continue with scheduled appointment with orthopedic specialist. - order am upright walker. Orders:   predniSONE  (DELTASONE ) 20 MG tablet; Take 1 tablet (20 mg total) by mouth daily with breakfast.

## 2024-09-01 NOTE — Assessment & Plan Note (Signed)
 Diarrhea likely due to magnesium  oxide and Senokot use. Dehydration suspected from increased bowel movements. - Stop Senokot. - Decrease magnesium  oxide to 400 mg twice a day. - Ordered stool studies. Orders:   Stool culture   Clostridium Difficile by PCR   Ova and parasite examination   Fecal leukocytes

## 2024-09-01 NOTE — Assessment & Plan Note (Signed)
 Decrease carvedilol  12.5 mg twice daily  Continue flecainide  and eliquis 

## 2024-09-01 NOTE — Assessment & Plan Note (Signed)
 Although hypertension is too high, lightheadedness and orthostatic bp is of more concern. Continue amlodipine  10 mg daily, carvedilol  12.5 mg twice daily, lasix  20 mg daily, olmesartan  40 mg daily, and kerendia  10 mg daily.

## 2024-09-01 NOTE — Assessment & Plan Note (Signed)
 May be secondary to bradycardia.  Decreased carvedilol  12.5 mg twice daily.

## 2024-09-03 LAB — CLOSTRIDIUM DIFFICILE BY PCR: Toxigenic C. Difficile by PCR: NEGATIVE

## 2024-09-04 LAB — OVA AND PARASITE EXAMINATION

## 2024-09-04 NOTE — Progress Notes (Signed)
 " Assessment: 1.  Recurring urinary tract infections in 78 year old female.  On methenamine  suppression.  She has not experienced symptoms of UTI since her first visit but she still has continued pyuria  2.  Probable vaginal atrophic changes, patient not candidate for estrogen therapy in perivaginal area due to protein S disease  3.  Mild incomplete bladder emptying.  Digital urine volume today 140 mL, stable   Plan: 1.  She will continue on her methenamine  long-term  2.  As she is doing well, I will have her come back in 1 year for recheck   History of Present Illness:  7.2.2025: Initial visit for evaluation of frequent urinary tract infections.  Over the past year or 2, it seems like she has infections almost every month.  Typical symptoms include frequency, urgency, dysuria.  She will also have hematuria.  She does not have febrile UTIs.    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  She was told years ago not to take estrogen replacement therapy.  She does not always have a good urinary stream.  She does have urgency and stress urinary incontinence.  She has seen a urologist in [place] in the past.  9.3.2025: Here today for recheck.  She has been using methenamine .  Which she knows of, she has not had any urinary infections.  Her urine does smell strong but she has had no dysuria.  well-tolerated.  1.5.2025: Overall she is doing well.  She thinks she may have been treated by Dr. Sherre for a UTI last month.  I see no evidence of that.  No gross hematuria or dysuria.  Nocturia x 1-3 but she does sleep up to 12 hours a night.  Past Medical History:  Past Medical History:  Diagnosis Date   Abnormal CT of the chest 07/20/2023   Acquired thrombophilia 06/27/2021   Acute combined systolic and diastolic heart failure (HCC) 12/08/2023   Acute cystitis without hematuria 06/12/2022   Acute heart failure with mildly reduced ejection fraction (HFmrEF, 41-49%) (HCC)  12/07/2023   Acute LUQ pain 02/12/2024   Acute on chronic diastolic (congestive) heart failure (HCC) 12/07/2023   Anemia in chronic kidney disease 09/06/2023   Anticoagulated on Coumadin     chronic--- managed by hematology/ oncology,   Arrhythmia    Asthma    Asthma, mild intermittent    followed by pcp--- per pt last exacerbation winter 2021 w/ acute bronchitis   Athscl heart disease of native coronary artery w/o ang pctrs 09/06/2023   Atrial fibrillation [I48.91] 10/01/2014   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, NYHA class 1 (HCC) 05/23/2011   Bilateral leg cramps    Blood dyscrasia    Bunion 08/23/2021   Cervical radiculopathy 07/19/2021   Cervical spondylosis 09/06/2023   Chest pain at rest 01/22/2024   Chronic constipation    Chronic diastolic congestive heart failure (HCC) 12/09/2020   Chronic pain of left ankle 03/31/2024   CKD (chronic kidney disease), stage III (HCC)    CKD stage 3a, GFR 45-59 ml/min (HCC) 11/05/2023   Closed bimalleolar fracture of left ankle 02/26/2020   Closed fracture of fifth metatarsal bone of left foot 12/06/2021   Closed fracture of fourth metatarsal bone 05/07/2021   Clotting disorder 1991   Protein S Deficency   Compression fracture of L1 lumbar vertebra (HCC) 12/17/2022   DDD (degenerative disc disease), cervical    w/ spondylosis,  per pt last steroid injection 06/ 2022  DDD (degenerative disc disease), lumbosacral    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   DJD (degenerative joint disease)    DVT (deep venous thrombosis) (HCC) 07/30/2013   Dyspnea    occasionally   Dyspnea on exertion 03/03/2013   Dysrhythmia    Dysuria 04/17/2023   Elevated rheumatoid factor 05/03/2024   Encntr for surgical aftcr following surgery on the circ sys 09/06/2023   Encounter for immunization 07/20/2023   Encounter for Medicare annual wellness exam 04/04/2024   Encounter for orthopedic follow-up care 03/10/2020   Flank pain 02/12/2024    GERD (gastroesophageal reflux disease)    Hammer toe 08/23/2021   Heart murmur    History of cardiac murmur as a child    History of DVT of lower extremity    left lower extremity in 1980s, fell when bowling   History of falling 09/06/2023   History of pulmonary embolus (PE) 1993   per pt left lung post op 2 wks cholecystectomy   History of rheumatic fever as a child    per last echo 12-31-2019 no valvular issues   History of TIA (transient ischemic attack)    2014 and 2018 or 2019,  per pt no residual   HTN (hypertension)    followed by pcp   Hyperkalemia 03/31/2024   Hyperlipidemia    Hypertensive heart and chronic kidney disease with heart failure and stage 1 through stage 4 chronic kidney disease, or unspecified chronic kidney disease (HCC) 12/12/2023   Hyponatremia 02/06/2019   Na 126 on no obvious meds to cause it with last na 133 in 2014      Hypothyroidism    IDA (iron deficiency anemia)    Joint pain 10/24/2021   Low back strain 08/09/2018   Lumbar radiculopathy 08/13/2021   Macular degeneration of both eyes    Mild obstructive sleep apnea    per pt dx 2017 tried to uses cpap but intolerant   Mixed incontinence urge and stress    urologist--- dr marda   Muscle pain 10/24/2021   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   OA (osteoarthritis) 07/06/2018   Osteoporosis    Other cardiomyopathies (HCC) 09/06/2023   Other instability, left foot 03/28/2024   Other microscopic hematuria 03/31/2024   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   PAF (paroxysmal atrial fibrillation) (HCC) 10/01/2014   cardiologist--- dr dub tobb;   cardiac cath 02-18-2013 normal coronaries arteries, ef 50%, cath done since echo showed ef 30-35%; nucleat stress study 04/ 2020 normal , normal echo 04/ 2021,  event monitor 09-14-2020 rare ST/AT variable block   Pain in left foot 12/17/2021   Pain of right hip joint 08/11/2022    Personal history of pneumonia (recurrent) 09/06/2023   Personal history of urinary (tract) infections 09/06/2023   Pneumonia    PONV (postoperative nausea and vomiting)    Presence of left artificial knee joint 09/06/2023   Protein S deficiency    followed by hemotology/ oncology-- dr d. 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/22/2022   Spondylolisthesis, lumbar region 08/09/2018   Sprain of right ankle 10/13/2023   Stage 3b chronic kidney disease (HCC) 12/17/2022   Stroke (HCC)    TIA in 2018 or 2019   Syncope 01/22/2024   Transient ischemic attack 07/30/2013   Traumatic arthritis of left ankle 02/12/2024   Type 2 diabetes mellitus treated with insulin  (HCC)    endocrilogist--- whitney reardon NP     (03-10-2021  pt continuously checks blood sugar throughout the day w/ Libre, fasting sugar --- 69--200)   Type 2 diabetes mellitus with diabetic neuropathy, with long-term current use of insulin  (HCC) 06/15/2021   Unilateral primary osteoarthritis, right knee 07/06/2018   Urinary frequency 02/12/2024   UTI (urinary tract infection) 01/23/2024   UTI (urinary tract infection) due to Enterococcus 01/22/2024   Ventricular premature depolarization 09/06/2023   Wears glasses    Wears hearing aid in both ears     Past Surgical History:  Past Surgical History:  Procedure Laterality Date   ABDOMINAL HYSTERECTOMY     BLEPHAROPLASTY     CARDIAC CATHETERIZATION   02/18/2013   @ [facility]  by Dr Jordan;  normal coronaries w/ preserved LVF, ef 50%;   previous cath 2001 normal ef 65%   CARPAL TUNNEL RELEASE Bilateral 1994   CATARACT EXTRACTION W/ INTRAOCULAR LENS IMPLANT Bilateral 2017   CHOLECYSTECTOMY     CHOLECYSTECTOMY, LAPAROSCOPIC  1993   COLONOSCOPY     EAR BIOPSY Left    FINGER SURGERY Left 2018   thumb   FOOT SURGERY     FOOT TENDON SURGERY Right    early 2000s   FRACTURE SURGERY Left 02/13/2020   left femur   HAND SURGERY Right 04/2022   HARDWARE REMOVAL Left 03/12/2021   Procedure: HARDWARE REMOVAL;  Surgeon: Sharl Selinda Dover, MD;  Location[facility] ([facility]  Service: Orthopedics;  Laterality: Left;  60 MINS   JOINT REPLACEMENT     LEFT HEART CATH AND CORONARY ANGIOGRAPHY N/A 12/07/2023   Procedure: LEFT HEART CATH AND CORONARY ANGIOGRAPHY;  Surgeon: Verlin Lonni BIRCH, MD;  Location: [facility] INVASIVE CV [facility];  Service: Cardiovascular;  Laterality: N/A;   LUMBAR LAMINECTOMY/DECOMPRESSION MICRODISCECTOMY N/A 12/22/2022   Procedure: Lumbar Two-Lumbar Three, Lumbar Three-Lumbar Four Lumbar Decompression;  Surgeon: Colon Shove, MD;  Location: [facility] OR;  Service: Neurosurgery;  Laterality: N/A;  RM 20 3C   ORIF ANKLE FRACTURE Left 02/26/2020   Procedure: OPEN REDUCTION INTERNAL FIXATION (ORIF) ANKLE FRACTURE;  Surgeon: Sharl Selinda Dover, MD;  Location: [facility] OR;  Service: Orthopedics;  Laterality: Left;  90 mins   TONSILLECTOMY AND ADENOIDECTOMY Bilateral    TOTAL KNEE ARTHROPLASTY Left 03/2005   TOTAL VAGINAL HYSTERECTOMY  1988   per pt still has ovaries    Allergies:  Allergies  Allergen Reactions   Loxapine Succinate Hives   Macrodantin  [Nitrofurantoin  Macrocrystal] Other (See Comments)    Pulmonary fibrosis   Naproxen Sodium Anaphylaxis and Hives   Telithromycin Nausea And Vomiting and Rash    Other Reaction(s): Unknown   Amoxapine And Related Hives   Darvon Nausea And Vomiting   Levothyroxine  Sodium Photosensitivity and  Other (See Comments)    Blurry and double vision   Amoxapine    Belsomra [Suvorexant] Other (See Comments)    Nightmares   Clavulanic Acid     Other Reaction(s): Not available   Cymbalta [Duloxetine Hcl] Other (See Comments)  Blackouts   Duloxetine    Gabapentin  Other (See Comments)    Blurry vision   Jardiance [Empagliflozin]     UTIs   Lyrica  [Pregabalin ]     Makes her sedated   Naproxen Hives    Other Reaction(s): hives and GI upset   Ozempic  (0.25 Or 0.5 Mg-Dose) [Semaglutide (0.25 Or 0.5mg -Dos)] Diarrhea   Semaglutide  Diarrhea    Rybelsus    Amoxicillin  Rash    Other Reaction(s): hives and GI upset   Propoxyphene Nausea And Vomiting    Other Reaction(s): hives and GI upset    Family History:  Family History  Problem Relation Age of Onset   Cirrhosis Mother    Antithrombin III deficiency Mother        multiple emboli   Ulcerative colitis Mother    Anemia Mother    Heart attack Mother        Anti thrombin  3, Cirrhosis of liver Short Bowel syndrome. Passed age 59 as result of blood clots/obstruction of bowels and gangreen.   Diabetes Father    Coronary artery disease Father    Heart attack Father        Dempsey- passed shortly after bypass surgery and Kidney failure. Age 79   Hypertension Father        Both Father ,brother and Sister Heart disease, diabetes, also   Kidney disease Father    Hypertension Sister        Heart disease and cancer also.   Diabetes Sister    Heart attack Sister        Ivy) has Protein S. deficency, Diabetis, Micro Vascular Disease, Heart Trouble with 4 Stints. Surgical removal breast cancer   Protein S deficiency Daughter    Heart attack Brother        Passed as result of Lung Cancer and Heart trouble   Hypertension Brother        Heart disease, cancer,  also   Lung cancer Brother    Stroke Neg Hx    Colitis Neg Hx    Colon polyps Neg Hx    Esophageal cancer Neg Hx    Liver cancer Neg Hx    Pancreatic cancer Neg Hx     Rectal cancer Neg Hx    Stomach cancer Neg Hx    Breast cancer Neg Hx     Social History:  Social History   Tobacco Use   Smoking status: Never   Smokeless tobacco: Never  Vaping Use   Vaping status: Never Used  Substance Use Topics   Alcohol use: No   Drug use: Never    Data: Residual urine volume the day 140 mL  Urinalysis revealed pyuria, patient asymptomatic   "

## 2024-09-06 ENCOUNTER — Ambulatory Visit: Payer: Self-pay | Admitting: Family Medicine

## 2024-09-06 LAB — STOOL CULTURE: E coli, Shiga toxin Assay: NEGATIVE

## 2024-09-06 LAB — FECAL LEUKOCYTES

## 2024-09-09 ENCOUNTER — Ambulatory Visit: Admitting: Urology

## 2024-09-09 VITALS — BP 150/59 | HR 57 | Ht 61.0 in | Wt 142.0 lb

## 2024-09-09 DIAGNOSIS — Q6479 Other congenital malformations of bladder and urethra: Secondary | ICD-10-CM

## 2024-09-09 DIAGNOSIS — Z87440 Personal history of urinary (tract) infections: Secondary | ICD-10-CM | POA: Diagnosis not present

## 2024-09-09 DIAGNOSIS — R8281 Pyuria: Secondary | ICD-10-CM

## 2024-09-09 DIAGNOSIS — R339 Retention of urine, unspecified: Secondary | ICD-10-CM | POA: Diagnosis not present

## 2024-09-09 DIAGNOSIS — N952 Postmenopausal atrophic vaginitis: Secondary | ICD-10-CM

## 2024-09-09 DIAGNOSIS — N3020 Other chronic cystitis without hematuria: Secondary | ICD-10-CM

## 2024-09-09 LAB — MICROSCOPIC EXAMINATION: WBC, UA: >30 /HPF — AB (ref 0–5)

## 2024-09-09 LAB — URINALYSIS, ROUTINE W REFLEX MICROSCOPIC
Bilirubin, UA: NEGATIVE
Glucose, UA: NEGATIVE
Ketones, UA: NEGATIVE
Nitrite, UA: NEGATIVE
Protein,UA: NEGATIVE
Specific Gravity, UA: 1.01 (ref 1.005–1.030)
Urobilinogen, Ur: 0.2 mg/dL (ref 0.2–1.0)
pH, UA: 5.5 (ref 5.0–7.5)

## 2024-09-11 ENCOUNTER — Encounter: Payer: Self-pay | Admitting: *Deleted

## 2024-09-12 ENCOUNTER — Telehealth: Payer: Self-pay

## 2024-09-12 NOTE — Telephone Encounter (Signed)
 Patient has been told she should get a steroid injection.  She is aware it will increase her glucose but wanted to know if there were any other options or suggestions.

## 2024-09-13 ENCOUNTER — Ambulatory Visit: Attending: Cardiology | Admitting: Cardiology

## 2024-09-13 VITALS — BP 138/50 | HR 48 | Ht 61.0 in | Wt 147.0 lb

## 2024-09-13 DIAGNOSIS — I493 Ventricular premature depolarization: Secondary | ICD-10-CM | POA: Diagnosis not present

## 2024-09-13 DIAGNOSIS — I10 Essential (primary) hypertension: Secondary | ICD-10-CM

## 2024-09-13 DIAGNOSIS — I420 Dilated cardiomyopathy: Secondary | ICD-10-CM

## 2024-09-13 DIAGNOSIS — D689 Coagulation defect, unspecified: Secondary | ICD-10-CM | POA: Insufficient documentation

## 2024-09-13 MED ORDER — CARVEDILOL 6.25 MG PO TABS: 6.2500 mg | ORAL_TABLET | Freq: Two times a day (BID) | ORAL | 3 refills | Status: AC

## 2024-09-13 NOTE — Progress Notes (Signed)
 " Cardiology Office Note:    Date:  09/13/2024   ID:  Pamela [name], DOB Nov 22, 1945, MRN [id number]  PCP:  Sherre Clapper, MD  Cardiologist:  Lamar Fitch, MD    Referring MD: Sherre Clapper, MD   No chief complaint on file. I am weak and tired,  History of Present Illness:    Pamela [name] is a 79 y.o. female past medical history significant for coronary disease, in January 2023 she did have coronary CT angio calcium  score 20.4, minimal CAD, and apical descent she did have cardiac catheterization after episode of syncope she did show normal coronaries she also got diminished ejection fraction 45 to 50%, moderately dilated left atrium, she was noted to have high burden of PVCs up to 23%, she was put on beta-blocker also seen by EP and was advised to start flecainide  which she is taking right now she comes today to my office to complain of being weak tired exhausted she looks little bit pale, also bradycardic.  No episode of syncope no proximal palpitations.  Past Medical History:  Diagnosis Date   Abnormal CT of the chest 07/20/2023   Acquired thrombophilia 06/27/2021   Acute combined systolic and diastolic heart failure (HCC) 12/08/2023   Acute cystitis without hematuria 06/12/2022   Acute heart failure with mildly reduced ejection fraction (HFmrEF, 41-49%) (HCC) 12/07/2023   Acute LUQ pain 02/12/2024   Acute on chronic diastolic (congestive) heart failure (HCC) 12/07/2023   Anemia in chronic kidney disease 09/06/2023   Anticoagulated on Coumadin     chronic--- managed by hematology/ oncology,   Arrhythmia    Asthma    Asthma, mild intermittent    followed by pcp--- per pt last exacerbation winter 2021 w/ acute bronchitis   Athscl heart disease of native coronary artery w/o ang pctrs 09/06/2023   Atrial fibrillation [I48.91] 10/01/2014   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, NYHA class 1 (HCC) 05/23/2011   Bilateral leg cramps    Blood  dyscrasia    Bunion 08/23/2021   Cervical radiculopathy 07/19/2021   Cervical spondylosis 09/06/2023   Chest pain at rest 01/22/2024   Chronic constipation    Chronic diastolic congestive heart failure (HCC) 12/09/2020   Chronic pain of left ankle 03/31/2024   CKD (chronic kidney disease), stage III (HCC)    CKD stage 3a, GFR 45-59 ml/min (HCC) 11/05/2023   Closed bimalleolar fracture of left ankle 02/26/2020   Closed fracture of fifth metatarsal bone of left foot 12/06/2021   Closed fracture of fourth metatarsal bone 05/07/2021   Clotting disorder 1991   Protein S Deficency   Compression fracture of L1 lumbar vertebra (HCC) 12/17/2022   DDD (degenerative disc disease), cervical    w/ spondylosis,  per pt last steroid injection 06/ 2022   DDD (degenerative disc disease), lumbosacral    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   DJD (degenerative joint disease)    DVT (deep venous thrombosis) (HCC) 07/30/2013   Dyspnea    occasionally   Dyspnea on exertion 03/03/2013   Dysrhythmia    Dysuria 04/17/2023   Elevated rheumatoid factor 05/03/2024   Encntr for surgical aftcr following surgery on the circ sys 09/06/2023   Encounter for immunization 07/20/2023   Encounter for Medicare annual wellness exam 04/04/2024   Encounter for orthopedic follow-up care 03/10/2020   Flank pain 02/12/2024   GERD (gastroesophageal reflux disease)    Hammer toe 08/23/2021   Heart murmur    History of cardiac murmur as  a child    History of DVT of lower extremity    left lower extremity in 1980s, fell when bowling   History of falling 09/06/2023   History of pulmonary embolus (PE) 1993   per pt left lung post op 2 wks cholecystectomy   History of rheumatic fever as a child    per last echo 12-31-2019 no valvular issues   History of TIA (transient ischemic attack)    2014 and 2018 or 2019,  per pt no residual   HTN (hypertension)    followed by pcp   Hyperkalemia 03/31/2024   Hyperlipidemia     Hypertensive heart and chronic kidney disease with heart failure and stage 1 through stage 4 chronic kidney disease, or unspecified chronic kidney disease (HCC) 12/12/2023   Hyponatremia 02/06/2019   Na 126 on no obvious meds to cause it with last na 133 in 2014      Hypothyroidism    IDA (iron deficiency anemia)    Joint pain 10/24/2021   Low back strain 08/09/2018   Lumbar radiculopathy 08/13/2021   Macular degeneration of both eyes    Mild obstructive sleep apnea    per pt dx 2017 tried to uses cpap but intolerant   Mixed incontinence urge and stress    urologist--- dr marda   Muscle pain 10/24/2021   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   OA (osteoarthritis) 07/06/2018   Osteoporosis    Other cardiomyopathies (HCC) 09/06/2023   Other instability, left foot 03/28/2024   Other microscopic hematuria 03/31/2024   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   PAF (paroxysmal atrial fibrillation) (HCC) 10/01/2014   cardiologist--- dr dub tobb;   cardiac cath 02-18-2013 normal coronaries arteries, ef 50%, cath done since echo showed ef 30-35%; nucleat stress study 04/ 2020 normal , normal echo 04/ 2021,  event monitor 09-14-2020 rare ST/AT variable block   Pain in left foot 12/17/2021   Pain of right hip joint 08/11/2022   Personal history of pneumonia (recurrent) 09/06/2023   Personal history of urinary (tract) infections 09/06/2023   Pneumonia    PONV (postoperative nausea and vomiting)    Presence of left artificial knee joint 09/06/2023   Protein S deficiency    followed by hemotology/ oncology-- dr d. ezzard ([facility]) dx 1980s;  prior DVT left lower leg 1980s and left lung PE 1993; chronic  coumadin  since 1980s   PVC's (premature ventricular contractions)    followed by cardiology   S/P cardiac catheterization 02/2013   Normal coronaries; low normal EF at 50%   Skin cancer screening 02/12/2024    Solitary pulmonary nodule 09/06/2023   Solitary pulmonary nodule on lung CT 02/06/2019   First noted 01/13/2014 > no change as of 12/21/2018   Spinal stenosis of lumbar region 07/19/2021   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/22/2022   Spondylolisthesis, lumbar region 08/09/2018   Sprain of right ankle 10/13/2023   Stage 3b chronic kidney disease (HCC) 12/17/2022   Stroke (HCC)    TIA in 2018 or 2019   Syncope 01/22/2024   Transient ischemic attack 07/30/2013   Traumatic arthritis of left ankle 02/12/2024   Type 2 diabetes mellitus treated with insulin  (HCC)    endocrilogist--- whitney reardon NP     (03-10-2021  pt continuously checks blood sugar throughout the day w/ Libre, fasting sugar --- 69--200)   Type 2 diabetes mellitus with diabetic neuropathy, with long-term current use  of insulin  (HCC) 06/15/2021   Unilateral primary osteoarthritis, right knee 07/06/2018   Urinary frequency 02/12/2024   UTI (urinary tract infection) 01/23/2024   UTI (urinary tract infection) due to Enterococcus 01/22/2024   Ventricular premature depolarization 09/06/2023   Wears glasses    Wears hearing aid in both ears     Past Surgical History:  Procedure Laterality Date   ABDOMINAL HYSTERECTOMY     BLEPHAROPLASTY     CARDIAC CATHETERIZATION  02/18/2013   @ [facility]  by Dr Jordan;  normal coronaries w/ preserved LVF, ef 50%;   previous cath 2001 normal ef 65%   CARPAL TUNNEL RELEASE Bilateral 1994   CATARACT EXTRACTION W/ INTRAOCULAR LENS IMPLANT Bilateral 2017   CHOLECYSTECTOMY     CHOLECYSTECTOMY, LAPAROSCOPIC  1993   COLONOSCOPY     EAR BIOPSY Left    FINGER SURGERY Left 2018   thumb   FOOT SURGERY     FOOT TENDON SURGERY Right    early 2000s   FRACTURE SURGERY Left 02/13/2020   left femur   HAND SURGERY Right 04/2022   HARDWARE REMOVAL Left 03/12/2021   Procedure: HARDWARE REMOVAL;  Surgeon: Sharl Selinda Dover, MD;  Location: [facility];  Service:  Orthopedics;  Laterality: Left;  60 MINS   JOINT REPLACEMENT     LEFT HEART CATH AND CORONARY ANGIOGRAPHY N/A 12/07/2023   Procedure: LEFT HEART CATH AND CORONARY ANGIOGRAPHY;  Surgeon: Verlin Lonni BIRCH, MD;  Location: [facility] INVASIVE CV LAB;  Service: Cardiovascular;  Laterality: N/A;   LUMBAR LAMINECTOMY/DECOMPRESSION MICRODISCECTOMY N/A 12/22/2022   Procedure: Lumbar Two-Lumbar Three, Lumbar Three-Lumbar Four Lumbar Decompression;  Surgeon: Colon Shove, MD;  Location: [facility] OR;  Service: Neurosurgery;  Laterality: N/A;  RM 20 3C   ORIF ANKLE FRACTURE Left 02/26/2020   Procedure: OPEN REDUCTION INTERNAL FIXATION (ORIF) ANKLE FRACTURE;  Surgeon: Sharl Selinda Dover, MD;  Location: [facility];  Service: Orthopedics;  Laterality: Left;  90 mins   TONSILLECTOMY AND ADENOIDECTOMY Bilateral    TOTAL KNEE ARTHROPLASTY Left 03/2005   TOTAL VAGINAL HYSTERECTOMY  1988   per pt still has ovaries    Current Medications: Active Medications[1]   Allergies:   Loxapine succinate, Macrodantin  [nitrofurantoin  macrocrystal], Naproxen sodium, Telithromycin, Amoxapine and related, Darvon, Levothyroxine  sodium, Amoxapine, Belsomra [suvorexant], Clavulanic acid, Cymbalta [duloxetine hcl], Duloxetine, Gabapentin , Jardiance [empagliflozin], Lyrica  [pregabalin ], Naproxen, Ozempic  (0.25 or 0.5 mg-dose) [semaglutide (0.25 or 0.5mg -dos)], Semaglutide , Amoxicillin , and Propoxyphene   Social History   Socioeconomic History   Marital status: Married    Spouse name: Fairy   Number of children: Not on file   Years of education: Not on file   Highest education level: Associate degree: occupational, scientist, product/process development, or vocational program  Occupational History   Not on file  Tobacco Use   Smoking status: Never   Smokeless tobacco: Never  Vaping Use   Vaping status: Never Used  Substance and Sexual Activity   Alcohol use: No   Drug use: Never   Sexual activity: Not Currently    Birth control/protection: Surgical     Comment: Hysterectomy  Other Topics Concern   Not on file  Social History Narrative   Lives with spouse in [place].  2 grown daughters.   Retired print production planner     Social Drivers of Health   Tobacco Use: Low Risk (09/13/2024)   Patient History    Smoking Tobacco Use: Never    Smokeless Tobacco Use: Never    Passive Exposure: Not on file  Financial Resource Strain:  Low Risk (08/26/2024)   Overall Financial Resource Strain (CARDIA)    Difficulty of Paying Living Expenses: Not very hard  Food Insecurity: No Food Insecurity (08/26/2024)   Epic    Worried About Programme Researcher, Broadcasting/film/video in the Last Year: Never true    Ran Out of Food in the Last Year: Never true  Transportation Needs: No Transportation Needs (08/26/2024)   Epic    Lack of Transportation (Medical): No    Lack of Transportation (Non-Medical): No  Physical Activity: Insufficiently Active (08/26/2024)   Exercise Vital Sign    Days of Exercise per Week: 1 day    Minutes of Exercise per Session: 30 min  Stress: Stress Concern Present (08/26/2024)   [employer] of Occupational Health - Occupational Stress Questionnaire    Feeling of Stress: To some extent  Social Connections: Moderately Integrated (08/26/2024)   Social Connection and Isolation Panel    Frequency of Communication with Friends and Family: More than three times a week    Frequency of Social Gatherings with Friends and Family: Once a week    Attends Religious Services: More than 4 times per year    Active Member of Clubs or Organizations: No    Attends Engineer, Structural: Not on file    Marital Status: Married  Depression (PHQ2-9): Low Risk (06/05/2024)   Depression (PHQ2-9)    PHQ-2 Score: 0  Recent Concern: Depression (PHQ2-9) - Medium Risk (03/29/2024)   Depression (PHQ2-9)    PHQ-2 Score: 8  Alcohol Screen: Low Risk (03/30/2023)   Alcohol Screen    Last Alcohol Screening Score (AUDIT): 0  Housing: Low Risk (08/26/2024)   Epic    Unable  to Pay for Housing in the Last Year: No    Number of Times Moved in the Last Year: 0    Homeless in the Last Year: No  Utilities: Not At Risk (01/23/2024)   AHC Utilities    Threatened with loss of utilities: No  Health Literacy: Adequate Health Literacy (03/30/2023)   B1300 Health Literacy    Frequency of need for help with medical instructions: Never     Family History: The patient's family history includes Anemia in her mother; Antithrombin III deficiency in her mother; Cirrhosis in her mother; Coronary artery disease in her father; Diabetes in her father and sister; Heart attack in her brother, father, mother, and sister; Hypertension in her brother, father, and sister; Kidney disease in her father; Lung cancer in her brother; Protein S deficiency in her daughter; Ulcerative colitis in her mother. There is no history of Stroke, Colitis, Colon polyps, Esophageal cancer, Liver cancer, Pancreatic cancer, Rectal cancer, Stomach cancer, or Breast cancer. ROS:   Please see the history of present illness.    All 14 point review of systems negative except as described per history of present illness  EKGs/Labs/Other Studies Reviewed:         Recent Labs: 03/19/2024: BNP 331.4 06/17/2024: Magnesium  2.3; NT-Pro BNP 766; TSH 3.060 08/26/2024: ALT 24; BUN 32; Creatinine, Ser 1.33; Hemoglobin 12.4; Platelets 338; Potassium 4.2; Sodium 135  Recent Lipid Panel    Component Value Date/Time   CHOL 115 08/26/2024 0804   TRIG 45 08/26/2024 0804   HDL 63 08/26/2024 0804   CHOLHDL 1.8 08/26/2024 0804   LDLCALC 41 08/26/2024 0804    Physical Exam:    VS:  BP (!) 138/50   Pulse (!) 48   Ht 5' 1 (1.549 m)   Wt 147 lb (66.7  kg)   SpO2 99%   BMI 27.78 kg/m     Wt Readings from Last 3 Encounters:  09/13/24 147 lb (66.7 kg)  09/09/24 142 lb (64.4 kg)  08/30/24 147 lb (66.7 kg)     GEN:  Well nourished, well developed in no acute distress HEENT: Normal NECK: No JVD; No carotid  bruits LYMPHATICS: No lymphadenopathy CARDIAC: RRR, no murmurs, no rubs, no gallops RESPIRATORY:  Clear to auscultation without rales, wheezing or rhonchi  ABDOMEN: Soft, non-tender, non-distended MUSCULOSKELETAL:  No edema; No deformity  SKIN: Warm and dry LOWER EXTREMITIES: no swelling NEUROLOGIC:  Alert and oriented x 3 PSYCHIATRIC:  Normal affect   ASSESSMENT:    1. Essential hypertension   2. PVC's (premature ventricular contractions)   3. Dilated cardiomyopathy (HCC)   4. Primary hypertension    PLAN:    In order of problems listed above:  PVCs.  Nothing on the EKG today, will reduce dose of carvedilol  because of her bradycardia and weakness fatigue I see her back in my office about 1 to 2 weeks to see how she does.  I will also check her CBC today as well as well as Chem-7 and magnesium . Essential hypertension, blood pressure well-controlled continue present management. Dilated cardiomyopathy we thinking that this is PVCs induced the idea was to suppress PVCs and then recheck biventricular ejection fraction.   Medication Adjustments/Labs and Tests Ordered: Current medicines are reviewed at length with the patient today.  Concerns regarding medicines are outlined above.  Orders Placed This Encounter  Procedures   EKG 12-Lead   Medication changes: No orders of the defined types were placed in this encounter.   Signed, Lamar DOROTHA Fitch, MD, [facility] 09/13/2024 9:39 AM    [facility]    [1]  Current Meds  Medication Sig   Accu-Chek Softclix Lancets lancets SMARTSIG:Topical 1-4 Times Daily   amLODipine  (NORVASC ) 10 MG tablet Take 10 mg by mouth daily.   amLODipine  (NORVASC ) 5 MG tablet Take 5 mg by mouth daily.   aspirin  EC (ASPIRIN  LOW DOSE) 81 MG tablet Take 1 tablet (81 mg total) by mouth daily. Swallow whole.   atorvastatin  (LIPITOR ) 80 MG tablet TAKE 1 TABLET(80 MG) BY MOUTH DAILY   blood glucose meter kit and supplies KIT Dispense based  on patient and insurance preference. Use up to four times daily as directed.   carboxymethylcellulose (REFRESH PLUS) 0.5 % SOLN Place 1 drop into both eyes 3 (three) times daily as needed (dry eyes).   carvedilol  (COREG ) 12.5 MG tablet Take 1 tablet (12.5 mg total) by mouth 2 (two) times daily.   Cholecalciferol (VITAMIN D3) 50 MCG (2000 UT) TABS Take 4,000 Units by mouth in the morning and at bedtime.   cyanocobalamin  (VITAMIN B12) 1000 MCG tablet Take 1,000 mcg by mouth daily with lunch.   ELIQUIS  5 MG TABS tablet TAKE 1 TABLET(5 MG) BY MOUTH TWICE DAILY   ferrous sulfate  325 (65 FE) MG tablet Take 325 mg by mouth daily with lunch.   Finerenone  (KERENDIA ) 10 MG TABS Take 1 tablet (10 mg total) by mouth daily.   flecainide  (TAMBOCOR ) 50 MG tablet Take 1 tablet (50 mg total) by mouth 2 (two) times daily.   fluticasone  (FLONASE ) 50 MCG/ACT nasal spray Place 2 sprays into both nostrils daily.   furosemide  (LASIX ) 20 MG tablet Take 1 tablet (20 mg total) by mouth daily. TAKE 1 TABLET(20 MG) BY MOUTH DAILY   GEMTESA  75 MG TABS Take  1 tablet (75 mg total) by mouth at bedtime.   glucose blood (ACCU-CHEK GUIDE TEST) test strip Use to check blood sugar up to 4 times per day   insulin  aspart (NOVOLOG  FLEXPEN) 100 UNIT/ML FlexPen Max daily 30 units   insulin  glargine, 1 Unit Dial , (TOUJEO  SOLOSTAR) 300 UNIT/ML Solostar Pen Inject 8 Units into the skin daily in the afternoon.   Insulin  Pen Needle 32G X 4 MM MISC 1 Device by Does not apply route in the morning, at noon, in the evening, and at bedtime.   magnesium  oxide (MAG-OX) 400 (240 Mg) MG tablet Take 2 tablets (800 mg total) by mouth daily.   methenamine  (HIPREX ) 1 g tablet Take 1 tablet (1 g total) by mouth 2 (two) times daily with a meal.   olmesartan  (BENICAR ) 40 MG tablet Take 1 tablet (40 mg total) by mouth daily.   ondansetron  (ZOFRAN -ODT) 8 MG disintegrating tablet Take 1 tablet (8 mg total) by mouth every 8 (eight) hours as needed for nausea or  vomiting.   pantoprazole  (PROTONIX ) 40 MG tablet TAKE 1 TABLET(40 MG) BY MOUTH EVERY MORNING   predniSONE  (DELTASONE ) 20 MG tablet Take 1 tablet (20 mg total) by mouth daily with breakfast.   pyridoxine  (B-6) 100 MG tablet Take 100 mg by mouth daily with lunch.   sodium chloride  (OCEAN) 0.65 % SOLN nasal spray Place 1 spray into both nostrils as needed for congestion.   zolpidem  (AMBIEN ) 5 MG tablet TAKE 1 TABLET(5 MG) BY MOUTH AT BEDTIME AS NEEDED FOR SLEEP   "

## 2024-09-13 NOTE — Patient Instructions (Signed)
 Medication Instructions:  Your physician recommends that you continue on your current medications as directed. Please refer to the Current Medication list given to you today.  *If you need a refill on your cardiac medications before your next appointment, please call your pharmacy*   Lab Work: Your physician recommends that you have a BMP, CBC, TSH and magnesium  today in the office.  If you have labs (blood work) drawn today and your tests are completely normal, you will receive your results only by: MyChart Message (if you have MyChart) OR A paper copy in the mail If you have any lab test that is abnormal or we need to change your treatment, we will call you to review the results.   Testing/Procedures: None ordered   Follow-Up: At [facility], you and your health needs are our priority.  As part of our continuing mission to provide you with exceptional heart care, we have created designated Provider Care Teams.  These Care Teams include your primary Cardiologist (physician) and Advanced Practice Providers (APPs -  Physician Assistants and Nurse Practitioners) who all work together to provide you with the care you need, when you need it.  We recommend signing up for the patient portal called MyChart.  Sign up information is provided on this After Visit Summary.  MyChart is used to connect with patients for Virtual Visits (Telemedicine).  Patients are able to view lab/test results, encounter notes, upcoming appointments, etc.  Non-urgent messages can be sent to your provider as well.   To learn more about what you can do with MyChart, go to [email or web address].    Your next appointment:   2 week(s)  The format for your next appointment:   In Person  Provider:   Lamar Fitch, MD    Other Instructions none  Important Information About Sugar

## 2024-09-13 NOTE — Addendum Note (Signed)
 Addended by: ONEITA BERLINER on: 09/13/2024 09:46 AM   Modules accepted: Orders

## 2024-09-13 NOTE — Telephone Encounter (Signed)
 Left message informing patient of recommendation.

## 2024-09-14 LAB — BASIC METABOLIC PANEL WITH GFR
BUN/Creatinine Ratio: 39 — ABNORMAL HIGH (ref 12–28)
BUN: 42 mg/dL — ABNORMAL HIGH (ref 8–27)
CO2: 25 mmol/L (ref 20–29)
Calcium: 9.4 mg/dL (ref 8.7–10.3)
Chloride: 93 mmol/L — ABNORMAL LOW (ref 96–106)
Creatinine, Ser: 1.09 mg/dL — ABNORMAL HIGH (ref 0.57–1.00)
Glucose: 118 mg/dL — ABNORMAL HIGH (ref 70–99)
Potassium: 5.3 mmol/L — ABNORMAL HIGH (ref 3.5–5.2)
Sodium: 130 mmol/L — ABNORMAL LOW (ref 134–144)
eGFR: 52 mL/min/1.73 — ABNORMAL LOW

## 2024-09-14 LAB — TSH: TSH: 3.73 u[IU]/mL (ref 0.450–4.500)

## 2024-09-14 LAB — CBC
Hematocrit: 34.2 % (ref 34.0–46.6)
Hemoglobin: 11.3 g/dL (ref 11.1–15.9)
MCH: 31 pg (ref 26.6–33.0)
MCHC: 33 g/dL (ref 31.5–35.7)
MCV: 94 fL (ref 79–97)
Platelets: 228 x10E3/uL (ref 150–450)
RBC: 3.65 x10E6/uL — ABNORMAL LOW (ref 3.77–5.28)
RDW: 12.4 % (ref 11.7–15.4)
WBC: 9 x10E3/uL (ref 3.4–10.8)

## 2024-09-14 LAB — MAGNESIUM: Magnesium: 1.8 mg/dL (ref 1.6–2.3)

## 2024-09-16 ENCOUNTER — Other Ambulatory Visit (HOSPITAL_BASED_OUTPATIENT_CLINIC_OR_DEPARTMENT_OTHER): Payer: Self-pay | Admitting: Family

## 2024-09-17 ENCOUNTER — Encounter: Payer: Self-pay | Admitting: Family Medicine

## 2024-09-17 ENCOUNTER — Ambulatory Visit (INDEPENDENT_AMBULATORY_CARE_PROVIDER_SITE_OTHER): Admitting: Family Medicine

## 2024-09-17 VITALS — BP 108/60 | HR 63 | Temp 97.8°F | Resp 18 | Ht 62.0 in | Wt 144.4 lb

## 2024-09-17 DIAGNOSIS — I10 Essential (primary) hypertension: Secondary | ICD-10-CM

## 2024-09-17 NOTE — Progress Notes (Signed)
 "  Subjective:  Patient ID: Pamela [name], female    DOB: 05/20/1946  Age: 79 y.o. MRN: [id number]  Chief Complaint  Patient presents with   Medical Management of Chronic Issues   Hypertension    Discussed the use of AI scribe software for clinical note transcription with the patient, who gave verbal consent to proceed.  History of Present Illness   Pamela [name] is an 79 year old female who presents for a blood pressure recheck and lab review.  Hypertension - Blood pressure readings have fluctuated between 176/80 and 186/70 mmHg. - Current antihypertensive regimen includes amlodipine  10 mg (recently increased from 5 mg, now taken in the evening), carvedilol  6.25 mg twice daily, olmesartan  40 mg and furosemide  as needed for swelling. - Denies chest pain or shortness of breath. - Denies dizziness or headaches.  Laboratory abnormalities - Recent laboratory results from January 9th indicate a low red blood cell count. - Expresses concern regarding these findings and requests review of the results.         06/05/2024   10:00 AM 03/29/2024   10:25 AM 04/17/2023    8:55 AM 03/30/2023    2:14 PM 12/14/2022    9:06 AM  Depression screen PHQ 2/9  Decreased Interest 0 0 1 0 0  Down, Depressed, Hopeless 0 0 0 0 0  PHQ - 2 Score 0 0 1 0 0  Altered sleeping  2 3    Tired, decreased energy  3 2    Change in appetite  0 0    Feeling bad or failure about yourself   1 0    Trouble concentrating  0 1    Moving slowly or fidgety/restless  2 1    Suicidal thoughts  0 0    PHQ-9 Score  8  8     Difficult doing work/chores  Somewhat difficult Somewhat difficult       Data saved with a previous flowsheet row definition        03/29/2024   10:25 AM  Fall Risk   Falls in the past year? 1  Number falls in past yr: 1  Injury with Fall? 1   Risk for fall due to : Impaired balance/gait  Follow up Falls evaluation completed     Data saved with a previous flowsheet  row definition    Patient Care Team: Sherre Clapper, MD as PCP - General (Internal Medicine) Inocencio Soyla Lunger, MD as PCP - Electrophysiology (Cardiology) Bernie Lamar PARAS, MD as PCP - Cardiology (Cardiology) Ezzard Valaria LABOR, MD as Consulting Physician (Oncology) Arloa Brunner, OD (Optometry) Ortho, Emerge (Specialist) Darlean Ozell NOVAK, MD as Consulting Physician (Pulmonary Disease) Ethyl Lonni BRAVO, MD (Inactive) as Consulting Physician (Otolaryngology) Kassie Mallick, MD (Inactive) as Consulting Physician (Endocrinology) Ishmael Slough, MD as Consulting Physician (Rheumatology) Armbruster, Elspeth SQUIBB, MD as Consulting Physician (Gastroenterology) Arloa Brunner, OD (Optometry) Bernie Lamar PARAS, MD as Consulting Physician (Cardiology) [facility], Donell Cardinal, MD as Attending Physician (Endocrinology)   Review of Systems  Constitutional:  Negative for chills, diaphoresis, fatigue and fever.  HENT:  Negative for congestion, ear pain and sinus pain.   Eyes:  Positive for visual disturbance.  Respiratory:  Negative for cough and shortness of breath.   Cardiovascular:  Negative for chest pain.  Gastrointestinal:  Positive for constipation. Negative for abdominal pain, diarrhea, nausea and vomiting.  Endocrine: Negative.   Genitourinary:  Negative for dysuria.  Musculoskeletal:  Negative for arthralgias.  Allergic/Immunologic: Negative.  Neurological:  Negative for dizziness, weakness, light-headedness and headaches.  Psychiatric/Behavioral:  Negative for dysphoric mood. The patient is not nervous/anxious.     Medications Ordered Prior to Encounter[1] Past Medical History:  Diagnosis Date   Abnormal CT of the chest 07/20/2023   Acquired thrombophilia 06/27/2021   Acute combined systolic and diastolic heart failure (HCC) 12/08/2023   Acute cystitis without hematuria 06/12/2022   Acute heart failure with mildly reduced ejection fraction (HFmrEF, 41-49%) (HCC) 12/07/2023    Acute LUQ pain 02/12/2024   Acute on chronic diastolic (congestive) heart failure (HCC) 12/07/2023   Anemia in chronic kidney disease 09/06/2023   Anticoagulated on Coumadin     chronic--- managed by hematology/ oncology,   Arrhythmia    Asthma    Asthma, mild intermittent    followed by pcp--- per pt last exacerbation winter 2021 w/ acute bronchitis   Athscl heart disease of native coronary artery w/o ang pctrs 09/06/2023   Atrial fibrillation [I48.91] 10/01/2014   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, NYHA class 1 (HCC) 05/23/2011   Bilateral leg cramps    Blood dyscrasia    Bunion 08/23/2021   Cervical radiculopathy 07/19/2021   Cervical spondylosis 09/06/2023   Chest pain at rest 01/22/2024   Chronic constipation    Chronic diastolic congestive heart failure (HCC) 12/09/2020   Chronic pain of left ankle 03/31/2024   CKD (chronic kidney disease), stage III (HCC)    CKD stage 3a, GFR 45-59 ml/min (HCC) 11/05/2023   Closed bimalleolar fracture of left ankle 02/26/2020   Closed fracture of fifth metatarsal bone of left foot 12/06/2021   Closed fracture of fourth metatarsal bone 05/07/2021   Clotting disorder 1991   Protein S Deficency   Compression fracture of L1 lumbar vertebra (HCC) 12/17/2022   DDD (degenerative disc disease), cervical    w/ spondylosis,  per pt last steroid injection 06/ 2022   DDD (degenerative disc disease), lumbosacral    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   DJD (degenerative joint disease)    DVT (deep venous thrombosis) (HCC) 07/30/2013   Dyspnea    occasionally   Dyspnea on exertion 03/03/2013   Dysrhythmia    Dysuria 04/17/2023   Elevated rheumatoid factor 05/03/2024   Encntr for surgical aftcr following surgery on the circ sys 09/06/2023   Encounter for immunization 07/20/2023   Encounter for Medicare annual wellness exam 04/04/2024   Encounter for orthopedic follow-up care 03/10/2020   Flank pain 02/12/2024   GERD  (gastroesophageal reflux disease)    Hammer toe 08/23/2021   Heart murmur    History of cardiac murmur as a child    History of DVT of lower extremity    left lower extremity in 1980s, fell when bowling   History of falling 09/06/2023   History of pulmonary embolus (PE) 1993   per pt left lung post op 2 wks cholecystectomy   History of rheumatic fever as a child    per last echo 12-31-2019 no valvular issues   History of TIA (transient ischemic attack)    2014 and 2018 or 2019,  per pt no residual   HTN (hypertension)    followed by pcp   Hyperkalemia 03/31/2024   Hyperlipidemia    Hypertensive heart and chronic kidney disease with heart failure and stage 1 through stage 4 chronic kidney disease, or unspecified chronic kidney disease (HCC) 12/12/2023   Hyponatremia 02/06/2019   Na 126 on no obvious meds to cause it with last na 133 in 2014  Hypothyroidism    IDA (iron deficiency anemia)    Joint pain 10/24/2021   Low back strain 08/09/2018   Lumbar radiculopathy 08/13/2021   Macular degeneration of both eyes    Mild obstructive sleep apnea    per pt dx 2017 tried to uses cpap but intolerant   Mixed incontinence urge and stress    urologist--- dr marda   Muscle pain 10/24/2021   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   OA (osteoarthritis) 07/06/2018   Osteoporosis    Other cardiomyopathies (HCC) 09/06/2023   Other instability, left foot 03/28/2024   Other microscopic hematuria 03/31/2024   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   PAF (paroxysmal atrial fibrillation) (HCC) 10/01/2014   cardiologist--- dr dub tobb;   cardiac cath 02-18-2013 normal coronaries arteries, ef 50%, cath done since echo showed ef 30-35%; nucleat stress study 04/ 2020 normal , normal echo 04/ 2021,  event monitor 09-14-2020 rare ST/AT variable block   Pain in left foot 12/17/2021   Pain of right hip joint 08/11/2022   Personal  history of pneumonia (recurrent) 09/06/2023   Personal history of urinary (tract) infections 09/06/2023   Pneumonia    PONV (postoperative nausea and vomiting)    Presence of left artificial knee joint 09/06/2023   Protein S deficiency    followed by hemotology/ oncology-- dr d. ezzard ([facility]) dx 1980s;  prior DVT left lower leg 1980s and left lung PE 1993; chronic  coumadin  since 1980s   PVC's (premature ventricular contractions)    followed by cardiology   S/P cardiac catheterization 02/2013   Normal coronaries; low normal EF at 50%   Skin cancer screening 02/12/2024   Solitary pulmonary nodule 09/06/2023   Solitary pulmonary nodule on lung CT 02/06/2019   First noted 01/13/2014 > no change as of 12/21/2018   Spinal stenosis of lumbar region 07/19/2021   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/22/2022   Spondylolisthesis, lumbar region 08/09/2018   Sprain of right ankle 10/13/2023   Stage 3b chronic kidney disease (HCC) 12/17/2022   Stroke (HCC)    TIA in 2018 or 2019   Syncope 01/22/2024   Transient ischemic attack 07/30/2013   Traumatic arthritis of left ankle 02/12/2024   Type 2 diabetes mellitus treated with insulin  (HCC)    endocrilogist--- whitney reardon NP     (03-10-2021  pt continuously checks blood sugar throughout the day w/ Libre, fasting sugar --- 69--200)   Type 2 diabetes mellitus with diabetic neuropathy, with long-term current use of insulin  (HCC) 06/15/2021   Unilateral primary osteoarthritis, right knee 07/06/2018   Urinary frequency 02/12/2024   UTI (urinary tract infection) 01/23/2024   UTI (urinary tract infection) due to Enterococcus 01/22/2024   Ventricular premature depolarization 09/06/2023   Wears glasses    Wears hearing aid in both ears    Past Surgical History:  Procedure Laterality Date   ABDOMINAL HYSTERECTOMY     BLEPHAROPLASTY     CARDIAC CATHETERIZATION  02/18/2013   @ [facility]  by Dr Jordan;  normal  coronaries w/ preserved LVF, ef 50%;   previous cath 2001 normal ef 65%   CARPAL TUNNEL RELEASE Bilateral 1994   CATARACT EXTRACTION W/ INTRAOCULAR LENS IMPLANT Bilateral 2017   CHOLECYSTECTOMY     CHOLECYSTECTOMY, LAPAROSCOPIC  1993   COLONOSCOPY     EAR BIOPSY Left    FINGER SURGERY Left 2018   thumb   FOOT SURGERY  FOOT TENDON SURGERY Right    early 2000s   FRACTURE SURGERY Left 02/13/2020   left femur   HAND SURGERY Right 04/2022   HARDWARE REMOVAL Left 03/12/2021   Procedure: HARDWARE REMOVAL;  Surgeon: Sharl Selinda Dover, MD;  Location: [facility];  Service: Orthopedics;  Laterality: Left;  60 MINS   JOINT REPLACEMENT     LEFT HEART CATH AND CORONARY ANGIOGRAPHY N/A 12/07/2023   Procedure: LEFT HEART CATH AND CORONARY ANGIOGRAPHY;  Surgeon: Verlin Lonni BIRCH, MD;  Location: [facility] INVASIVE CV LAB;  Service: Cardiovascular;  Laterality: N/A;   LUMBAR LAMINECTOMY/DECOMPRESSION MICRODISCECTOMY N/A 12/22/2022   Procedure: Lumbar Two-Lumbar Three, Lumbar Three-Lumbar Four Lumbar Decompression;  Surgeon: Colon Shove, MD;  Location: [facility] OR;  Service: Neurosurgery;  Laterality: N/A;  RM 20 3C   ORIF ANKLE FRACTURE Left 02/26/2020   Procedure: OPEN REDUCTION INTERNAL FIXATION (ORIF) ANKLE FRACTURE;  Surgeon: Sharl Selinda Dover, MD;  Location[facility];  Service: Orthopedics;  Laterality: Left;  90 mins   TONSILLECTOMY AND ADENOIDECTOMY Bilateral    TOTAL KNEE ARTHROPLASTY Left 03/2005   TOTAL VAGINAL HYSTERECTOMY  1988   per pt still has ovaries    Family History  Problem Relation Age of Onset   Cirrhosis Mother    Antithrombin III deficiency Mother        multiple emboli   Ulcerative colitis Mother    Anemia Mother    Heart attack Mother        Anti thrombin  3, Cirrhosis of liver Short Bowel syndrome. Passed age 7 as result of blood clots/obstruction of bowels and gangreen.   Diabetes Father    Coronary artery disease Father    Heart attack Father         Dempsey- passed shortly after bypass surgery and Kidney failure. Age 2   Hypertension Father        Both Father ,brother and Sister Heart disease, diabetes, also   Kidney disease Father    Hypertension Sister        Heart disease and cancer also.   Diabetes Sister    Heart attack Sister        Ivy) has Protein S. deficency, Diabetis, Micro Vascular Disease, Heart Trouble with 4 Stints. Surgical removal breast cancer   Protein S deficiency Daughter    Heart attack Brother        Passed as result of Lung Cancer and Heart trouble   Hypertension Brother        Heart disease, cancer,  also   Lung cancer Brother    Stroke Neg Hx    Colitis Neg Hx    Colon polyps Neg Hx    Esophageal cancer Neg Hx    Liver cancer Neg Hx    Pancreatic cancer Neg Hx    Rectal cancer Neg Hx    Stomach cancer Neg Hx    Breast cancer Neg Hx    Social History   Socioeconomic History   Marital status: Married    Spouse name: Fairy   Number of children: Not on file   Years of education: Not on file   Highest education level: Associate degree: occupational, scientist, product/process development, or vocational program  Occupational History   Not on file  Tobacco Use   Smoking status: Never   Smokeless tobacco: Never  Vaping Use   Vaping status: Never Used  Substance and Sexual Activity   Alcohol use: No   Drug use: Never   Sexual activity: Not Currently  Birth control/protection: Surgical    Comment: Hysterectomy  Other Topics Concern   Not on file  Social History Narrative   Lives with spouse in [place].  2 grown daughters.   Retired print production planner     Social Drivers of Health   Tobacco Use: Low Risk (09/17/2024)   Patient History    Smoking Tobacco Use: Never    Smokeless Tobacco Use: Never    Passive Exposure: Not on file  Financial Resource Strain: Low Risk (08/26/2024)   Overall Financial Resource Strain (CARDIA)    Difficulty of Paying Living Expenses: Not very hard  Food Insecurity: No Food Insecurity  (08/26/2024)   Epic    Worried About Radiation Protection Practitioner of Food in the Last Year: Never true    Ran Out of Food in the Last Year: Never true  Transportation Needs: No Transportation Needs (08/26/2024)   Epic    Lack of Transportation (Medical): No    Lack of Transportation (Non-Medical): No  Physical Activity: Insufficiently Active (08/26/2024)   Exercise Vital Sign    Days of Exercise per Week: 1 day    Minutes of Exercise per Session: 30 min  Stress: Stress Concern Present (08/26/2024)   [employer] of Occupational Health - Occupational Stress Questionnaire    Feeling of Stress: To some extent  Social Connections: Moderately Integrated (08/26/2024)   Social Connection and Isolation Panel    Frequency of Communication with Friends and Family: More than three times a week    Frequency of Social Gatherings with Friends and Family: Once a week    Attends Religious Services: More than 4 times per year    Active Member of Clubs or Organizations: No    Attends Engineer, Structural: Not on file    Marital Status: Married  Depression (PHQ2-9): Low Risk (06/05/2024)   Depression (PHQ2-9)    PHQ-2 Score: 0  Recent Concern: Depression (PHQ2-9) - Medium Risk (03/29/2024)   Depression (PHQ2-9)    PHQ-2 Score: 8  Alcohol Screen: Low Risk (03/30/2023)   Alcohol Screen    Last Alcohol Screening Score (AUDIT): 0  Housing: Low Risk (08/26/2024)   Epic    Unable to Pay for Housing in the Last Year: No    Number of Times Moved in the Last Year: 0    Homeless in the Last Year: No  Utilities: Not At Risk (01/23/2024)   AHC Utilities    Threatened with loss of utilities: No  Health Literacy: Adequate Health Literacy (03/30/2023)   B1300 Health Literacy    Frequency of need for help with medical instructions: Never    Objective:  BP 108/60   Pulse 63   Temp 97.8 F (36.6 C) (Temporal)   Resp 18   Ht 5' 2 (1.575 m)   Wt 144 lb 6.4 oz (65.5 kg)   SpO2 96%   BMI 26.41 kg/m       09/17/2024    9:29 AM 09/13/2024    9:09 AM 09/09/2024    9:56 AM  BP/Weight  Systolic BP 108 138 150  Diastolic BP 60 50 59  Wt. (Lbs) 144.4 147 142  BMI 26.41 kg/m2 27.78 kg/m2 26.83 kg/m2    Physical Exam Vitals reviewed.  Constitutional:      General: She is not in acute distress.    Appearance: Normal appearance.  Eyes:     Conjunctiva/sclera: Conjunctivae normal.  Cardiovascular:     Rate and Rhythm: Normal rate and regular rhythm.  Heart sounds: Normal heart sounds. No murmur heard. Pulmonary:     Effort: Pulmonary effort is normal. No respiratory distress.     Breath sounds: Normal breath sounds. No wheezing.  Musculoskeletal:        General: Normal range of motion.  Skin:    General: Skin is warm.  Neurological:     Mental Status: She is alert. Mental status is at baseline.  Psychiatric:        Mood and Affect: Mood normal.        Behavior: Behavior normal.     Lab Results  Component Value Date   WBC 9.0 09/13/2024   HGB 11.3 09/13/2024   HCT 34.2 09/13/2024   PLT 228 09/13/2024   GLUCOSE 118 (H) 09/13/2024   CHOL 115 08/26/2024   TRIG 45 08/26/2024   HDL 63 08/26/2024   LDLCALC 41 08/26/2024   ALT 24 08/26/2024   AST 26 08/26/2024   NA 130 (L) 09/13/2024   K 5.3 (H) 09/13/2024   CL 93 (L) 09/13/2024   CREATININE 1.09 (H) 09/13/2024   BUN 42 (H) 09/13/2024   CO2 25 09/13/2024   TSH 3.730 09/13/2024   INR 1.3 (H) 06/05/2024   HGBA1C 7.7 (H) 08/26/2024    Results for orders placed or performed in visit on 09/13/24  Basic metabolic panel with GFR   Collection Time: 09/13/24 10:01 AM  Result Value Ref Range   Glucose 118 (H) 70 - 99 mg/dL   BUN 42 (H) 8 - 27 mg/dL   Creatinine, Ser 8.90 (H) 0.57 - 1.00 mg/dL   eGFR 52 (L) >40 fO/fpw/8.26   BUN/Creatinine Ratio 39 (H) 12 - 28   Sodium 130 (L) 134 - 144 mmol/L   Potassium 5.3 (H) 3.5 - 5.2 mmol/L   Chloride 93 (L) 96 - 106 mmol/L   CO2 25 20 - 29 mmol/L   Calcium  9.4 8.7 - 10.3 mg/dL  CBC    Collection Time: 09/13/24 10:01 AM  Result Value Ref Range   WBC 9.0 3.4 - 10.8 x10E3/uL   RBC 3.65 (L) 3.77 - 5.28 x10E6/uL   Hemoglobin 11.3 11.1 - 15.9 g/dL   Hematocrit 65.7 65.9 - 46.6 %   MCV 94 79 - 97 fL   MCH 31.0 26.6 - 33.0 pg   MCHC 33.0 31.5 - 35.7 g/dL   RDW 87.5 88.2 - 84.5 %   Platelets 228 150 - 450 x10E3/uL  Magnesium    Collection Time: 09/13/24 10:01 AM  Result Value Ref Range   Magnesium  1.8 1.6 - 2.3 mg/dL  TSH   Collection Time: 09/13/24 10:01 AM  Result Value Ref Range   TSH 3.730 0.450 - 4.500 uIU/mL   *Note: Due to a large number of results and/or encounters for the requested time period, some results have not been displayed. A complete set of results can be found in Results Review.  .  Assessment & Plan:   Assessment & Plan Primary hypertension Essential hypertension Blood pressure improved with current medication regimen. BP Readings from Last 3 Encounters:  09/17/24 108/60  09/13/24 (!) 138/50  09/09/24 (!) 150/59   - Continue carvedilol  6.25 mg twice daily. - Continue amlodipine  10 mg in the evening. - Use Lasix  as needed for swelling. - Continue Corindium for kidney function.     General Health Maintenance No vaccines due. Eye exam scheduled. - Attend scheduled eye exam on January 28th at 10:30 AM.      Follow-up: Return for lab visit,  one week before appt on 12/04/24.  An After Visit Summary was printed and given to the patient.  Harrie Cedar, FNP [facility] [phone]      [1]  Current Outpatient Medications on File Prior to Visit  Medication Sig Dispense Refill   Accu-Chek Softclix Lancets lancets SMARTSIG:Topical 1-4 Times Daily     amLODipine  (NORVASC ) 10 MG tablet Take 10 mg by mouth daily.     aspirin  EC (ASPIRIN  LOW DOSE) 81 MG tablet Take 1 tablet (81 mg total) by mouth daily. Swallow whole. 90 tablet 3   atorvastatin  (LIPITOR ) 80 MG tablet TAKE 1 TABLET(80 MG) BY MOUTH DAILY 90 tablet 3   blood glucose meter  kit and supplies KIT Dispense based on patient and insurance preference. Use up to four times daily as directed. 1 each 0   carboxymethylcellulose (REFRESH PLUS) 0.5 % SOLN Place 1 drop into both eyes 3 (three) times daily as needed (dry eyes).     carvedilol  (COREG ) 6.25 MG tablet Take 1 tablet (6.25 mg total) by mouth 2 (two) times daily. 180 tablet 3   Cholecalciferol (VITAMIN D3) 50 MCG (2000 UT) TABS Take 4,000 Units by mouth in the morning and at bedtime.     cyanocobalamin  (VITAMIN B12) 1000 MCG tablet Take 1,000 mcg by mouth daily with lunch.     ELIQUIS  5 MG TABS tablet TAKE 1 TABLET(5 MG) BY MOUTH TWICE DAILY 120 tablet 1   ferrous sulfate  325 (65 FE) MG tablet Take 325 mg by mouth daily with lunch.     Finerenone  (KERENDIA ) 10 MG TABS Take 1 tablet (10 mg total) by mouth daily. 90 tablet 1   flecainide  (TAMBOCOR ) 50 MG tablet Take 1 tablet (50 mg total) by mouth 2 (two) times daily. 60 tablet 3   fluticasone  (FLONASE ) 50 MCG/ACT nasal spray Place 2 sprays into both nostrils daily. 16 g 6   furosemide  (LASIX ) 20 MG tablet Take 1 tablet (20 mg total) by mouth daily. TAKE 1 TABLET(20 MG) BY MOUTH DAILY 90 tablet 0   GEMTESA  75 MG TABS Take 1 tablet (75 mg total) by mouth at bedtime. 90 tablet 3   glucose blood (ACCU-CHEK GUIDE TEST) test strip Use to check blood sugar up to 4 times per day 400 each 5   insulin  aspart (NOVOLOG  FLEXPEN) 100 UNIT/ML FlexPen Max daily 30 units 30 mL 3   insulin  glargine, 1 Unit Dial , (TOUJEO  SOLOSTAR) 300 UNIT/ML Solostar Pen Inject 8 Units into the skin daily in the afternoon.     Insulin  Pen Needle 32G X 4 MM MISC 1 Device by Does not apply route in the morning, at noon, in the evening, and at bedtime. 400 each 3   magnesium  oxide (MAG-OX) 400 (240 Mg) MG tablet Take 2 tablets (800 mg total) by mouth daily.     methenamine  (HIPREX ) 1 g tablet Take 1 tablet (1 g total) by mouth 2 (two) times daily with a meal. 60 tablet 11   olmesartan  (BENICAR ) 40 MG tablet  Take 1 tablet (40 mg total) by mouth daily. 90 tablet 1   ondansetron  (ZOFRAN -ODT) 8 MG disintegrating tablet Take 1 tablet (8 mg total) by mouth every 8 (eight) hours as needed for nausea or vomiting. 30 tablet 2   pantoprazole  (PROTONIX ) 40 MG tablet TAKE 1 TABLET(40 MG) BY MOUTH EVERY MORNING 90 tablet 1   predniSONE  (DELTASONE ) 20 MG tablet Take 1 tablet (20 mg total) by mouth daily with breakfast. 5 tablet 0  pyridoxine  (B-6) 100 MG tablet Take 100 mg by mouth daily with lunch.     sodium chloride  (OCEAN) 0.65 % SOLN nasal spray Place 1 spray into both nostrils as needed for congestion.     zolpidem  (AMBIEN ) 5 MG tablet TAKE 1 TABLET(5 MG) BY MOUTH AT BEDTIME AS NEEDED FOR SLEEP 30 tablet 5   No current facility-administered medications on file prior to visit.   "

## 2024-09-17 NOTE — Assessment & Plan Note (Signed)
 Essential hypertension Blood pressure improved with current medication regimen. BP Readings from Last 3 Encounters:  09/17/24 108/60  09/13/24 (!) 138/50  09/09/24 (!) 150/59   - Continue carvedilol  6.25 mg twice daily. - Continue amlodipine  10 mg in the evening. - Use Lasix  as needed for swelling. - Continue Corindium for kidney function.

## 2024-09-20 ENCOUNTER — Telehealth: Payer: Self-pay

## 2024-09-20 ENCOUNTER — Ambulatory Visit: Payer: Self-pay | Admitting: Cardiology

## 2024-09-20 DIAGNOSIS — I10 Essential (primary) hypertension: Secondary | ICD-10-CM

## 2024-09-20 NOTE — Telephone Encounter (Signed)
 Left message on My Chart with lab results per Dr. Karry note. Routed to PCP

## 2024-09-23 ENCOUNTER — Telehealth: Payer: Self-pay

## 2024-09-23 NOTE — Telephone Encounter (Signed)
 Pt viewed lab results on My Chart per Dr. Karry note. Routed to PCP.

## 2024-09-26 ENCOUNTER — Other Ambulatory Visit (HOSPITAL_BASED_OUTPATIENT_CLINIC_OR_DEPARTMENT_OTHER): Payer: Self-pay | Admitting: Family

## 2024-09-26 DIAGNOSIS — I5021 Acute systolic (congestive) heart failure: Secondary | ICD-10-CM

## 2024-09-27 ENCOUNTER — Ambulatory Visit: Attending: Cardiology | Admitting: Cardiology

## 2024-09-27 ENCOUNTER — Other Ambulatory Visit

## 2024-09-27 ENCOUNTER — Encounter: Payer: Self-pay | Admitting: Cardiology

## 2024-09-27 VITALS — BP 102/60 | HR 64 | Ht 62.0 in | Wt 146.0 lb

## 2024-09-27 DIAGNOSIS — I420 Dilated cardiomyopathy: Secondary | ICD-10-CM

## 2024-09-27 DIAGNOSIS — I493 Ventricular premature depolarization: Secondary | ICD-10-CM

## 2024-09-27 DIAGNOSIS — R809 Proteinuria, unspecified: Secondary | ICD-10-CM | POA: Diagnosis not present

## 2024-09-27 DIAGNOSIS — I10 Essential (primary) hypertension: Secondary | ICD-10-CM

## 2024-09-27 DIAGNOSIS — Z794 Long term (current) use of insulin: Secondary | ICD-10-CM | POA: Diagnosis not present

## 2024-09-27 DIAGNOSIS — I5032 Chronic diastolic (congestive) heart failure: Secondary | ICD-10-CM

## 2024-09-27 DIAGNOSIS — E1129 Type 2 diabetes mellitus with other diabetic kidney complication: Secondary | ICD-10-CM | POA: Diagnosis not present

## 2024-09-27 LAB — BASIC METABOLIC PANEL WITH GFR
BUN/Creatinine Ratio: 31 — ABNORMAL HIGH (ref 12–28)
BUN: 33 mg/dL — ABNORMAL HIGH (ref 8–27)
CO2: 22 mmol/L (ref 20–29)
Calcium: 9 mg/dL (ref 8.7–10.3)
Chloride: 100 mmol/L (ref 96–106)
Creatinine, Ser: 1.05 mg/dL — ABNORMAL HIGH (ref 0.57–1.00)
Glucose: 270 mg/dL — ABNORMAL HIGH (ref 70–99)
Potassium: 4.4 mmol/L (ref 3.5–5.2)
Sodium: 138 mmol/L (ref 134–144)
eGFR: 54 mL/min/1.73 — ABNORMAL LOW

## 2024-09-27 MED ORDER — AMLODIPINE BESYLATE 5 MG PO TABS: 5.0000 mg | ORAL_TABLET | Freq: Every day | ORAL | 3 refills | Status: AC

## 2024-09-27 NOTE — Patient Instructions (Signed)
 Medication Instructions:  DECREASE: Amlodipine  to 5mg  daily   Lab Work: CMP, CBC- today If you have labs (blood work) drawn today and your tests are completely normal, you will receive your results only by: MyChart Message (if you have MyChart) OR A paper copy in the mail If you have any lab test that is abnormal or we need to change your treatment, we will call you to review the results.   Testing/Procedures: None Ordered   Follow-Up: At [facility], you and your health needs are our priority.  As part of our continuing mission to provide you with exceptional heart care, we have created designated Provider Care Teams.  These Care Teams include your primary Cardiologist (physician) and Advanced Practice Providers (APPs -  Physician Assistants and Nurse Practitioners) who all work together to provide you with the care you need, when you need it.  We recommend signing up for the patient portal called MyChart.  Sign up information is provided on this After Visit Summary.  MyChart is used to connect with patients for Virtual Visits (Telemedicine).  Patients are able to view lab/test results, encounter notes, upcoming appointments, etc.  Non-urgent messages can be sent to your provider as well.   To learn more about what you can do with MyChart, go to [email or web address].    Your next appointment:   1 month(s)  The format for your next appointment:   In Person  Provider:   Lamar Fitch, MD    Other Instructions Referral to Dietician - they will call for appt

## 2024-09-27 NOTE — Progress Notes (Unsigned)
 " Cardiology Office Note:    Date:  09/27/2024   ID:  Pamela [name], DOB 04-09-1946, MRN [id number]  PCP:  Sherre Clapper, MD  Cardiologist:  Lamar Fitch, MD    Referring MD: Sherre Clapper, MD   Chief Complaint  Patient presents with   Follow-up  Doing better  History of Present Illness:     Pamela [name] is a 79 y.o. female past medical history significant for coronary artery disease in January 2023 she did have coronary CT angio calcium  score 12.4 minimal CAD observed.  After that she end up having cardiac catheterization after episode of syncope she was found to have normal coronaries mildly diminished ejection fraction 45-50 moderately dilated left atrium.  She was also found to have frequent PVCs total burden 23%, she was put on beta-blocker and referred to EP, they initiated flecainide  she is coming today feeling a little better than last time I see him 2 weeks ago at that time I noticed her to be quite significantly bradycardic dose of carvedilol  has been reduced which led to improvement in her wellbeing she said that she is about 20 to 30% better.  But still weak tired and exhausted.  No chest pain tightness squeezing pressure mention  Past Medical History:  Diagnosis Date   Abnormal CT of the chest 07/20/2023   Acquired thrombophilia 06/27/2021   Acute combined systolic and diastolic heart failure (HCC) 12/08/2023   Acute cystitis without hematuria 06/12/2022   Acute heart failure with mildly reduced ejection fraction (HFmrEF, 41-49%) (HCC) 12/07/2023   Acute LUQ pain 02/12/2024   Acute on chronic diastolic (congestive) heart failure (HCC) 12/07/2023   Anemia in chronic kidney disease 09/06/2023   Anticoagulated on Coumadin     chronic--- managed by hematology/ oncology,   Arrhythmia    Asthma    Asthma, mild intermittent    followed by pcp--- per pt last exacerbation winter 2021 w/ acute bronchitis   Athscl heart disease of native coronary  artery w/o ang pctrs 09/06/2023   Atrial fibrillation [I48.91] 10/01/2014   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, NYHA class 1 (HCC) 05/23/2011   Bilateral leg cramps    Blood dyscrasia    Bunion 08/23/2021   Cervical radiculopathy 07/19/2021   Cervical spondylosis 09/06/2023   Chest pain at rest 01/22/2024   Chronic constipation    Chronic diastolic congestive heart failure (HCC) 12/09/2020   Chronic pain of left ankle 03/31/2024   CKD (chronic kidney disease), stage III (HCC)    CKD stage 3a, GFR 45-59 ml/min (HCC) 11/05/2023   Closed bimalleolar fracture of left ankle 02/26/2020   Closed fracture of fifth metatarsal bone of left foot 12/06/2021   Closed fracture of fourth metatarsal bone 05/07/2021   Clotting disorder 1991   Protein S Deficency   Compression fracture of L1 lumbar vertebra (HCC) 12/17/2022   DDD (degenerative disc disease), cervical    w/ spondylosis,  per pt last steroid injection 06/ 2022   DDD (degenerative disc disease), lumbosacral    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   DJD (degenerative joint disease)    DVT (deep venous thrombosis) (HCC) 07/30/2013   Dyspnea    occasionally   Dyspnea on exertion 03/03/2013   Dysrhythmia    Dysuria 04/17/2023   Elevated rheumatoid factor 05/03/2024   Encntr for surgical aftcr following surgery on the circ sys 09/06/2023   Encounter for immunization 07/20/2023   Encounter for Medicare annual wellness exam 04/04/2024   Encounter for  orthopedic follow-up care 03/10/2020   Flank pain 02/12/2024   GERD (gastroesophageal reflux disease)    Hammer toe 08/23/2021   Heart murmur    History of cardiac murmur as a child    History of DVT of lower extremity    left lower extremity in 1980s, fell when bowling   History of falling 09/06/2023   History of pulmonary embolus (PE) 1993   per pt left lung post op 2 wks cholecystectomy   History of rheumatic fever as a child    per last echo 12-31-2019 no  valvular issues   History of TIA (transient ischemic attack)    2014 and 2018 or 2019,  per pt no residual   HTN (hypertension)    followed by pcp   Hyperkalemia 03/31/2024   Hyperlipidemia    Hypertensive heart and chronic kidney disease with heart failure and stage 1 through stage 4 chronic kidney disease, or unspecified chronic kidney disease (HCC) 12/12/2023   Hyponatremia 02/06/2019   Na 126 on no obvious meds to cause it with last na 133 in 2014      Hypothyroidism    IDA (iron deficiency anemia)    Joint pain 10/24/2021   Low back strain 08/09/2018   Lumbar radiculopathy 08/13/2021   Macular degeneration of both eyes    Mild obstructive sleep apnea    per pt dx 2017 tried to uses cpap but intolerant   Mixed incontinence urge and stress    urologist--- dr marda   Muscle pain 10/24/2021   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   OA (osteoarthritis) 07/06/2018   Osteoporosis    Other cardiomyopathies (HCC) 09/06/2023   Other instability, left foot 03/28/2024   Other microscopic hematuria 03/31/2024   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   PAF (paroxysmal atrial fibrillation) (HCC) 10/01/2014   cardiologist--- dr dub tobb;   cardiac cath 02-18-2013 normal coronaries arteries, ef 50%, cath done since echo showed ef 30-35%; nucleat stress study 04/ 2020 normal , normal echo 04/ 2021,  event monitor 09-14-2020 rare ST/AT variable block   Pain in left foot 12/17/2021   Pain of right hip joint 08/11/2022   Personal history of pneumonia (recurrent) 09/06/2023   Personal history of urinary (tract) infections 09/06/2023   Pneumonia    PONV (postoperative nausea and vomiting)    Presence of left artificial knee joint 09/06/2023   Protein S deficiency    followed by hemotology/ oncology-- dr d. ezzard ([facility]) dx 1980s;  prior DVT left lower leg 1980s and left lung PE 1993; chronic  coumadin  since  1980s   PVC's (premature ventricular contractions)    followed by cardiology   S/P cardiac catheterization 02/2013   Normal coronaries; low normal EF at 50%   Skin cancer screening 02/12/2024   Solitary pulmonary nodule 09/06/2023   Solitary pulmonary nodule on lung CT 02/06/2019   First noted 01/13/2014 > no change as of 12/21/2018   Spinal stenosis of lumbar region 07/19/2021   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/22/2022   Spondylolisthesis, lumbar region 08/09/2018   Sprain of right ankle 10/13/2023   Stage 3b chronic kidney disease (HCC) 12/17/2022   Stroke (HCC)    TIA in 2018 or 2019   Syncope 01/22/2024   Transient ischemic attack 07/30/2013   Traumatic arthritis of left ankle 02/12/2024   Type 2 diabetes mellitus treated with insulin  (HCC)    endocrilogist--- whitney reardon NP     (  03-10-2021  pt continuously checks blood sugar throughout the day w/ Libre, fasting sugar --- 69--200)   Type 2 diabetes mellitus with diabetic neuropathy, with long-term current use of insulin  (HCC) 06/15/2021   Unilateral primary osteoarthritis, right knee 07/06/2018   Urinary frequency 02/12/2024   UTI (urinary tract infection) 01/23/2024   UTI (urinary tract infection) due to Enterococcus 01/22/2024   Ventricular premature depolarization 09/06/2023   Wears glasses    Wears hearing aid in both ears     Past Surgical History:  Procedure Laterality Date   ABDOMINAL HYSTERECTOMY     BLEPHAROPLASTY     CARDIAC CATHETERIZATION  02/18/2013   @ [facility]  by Dr Jordan;  normal coronaries w/ preserved LVF, ef 50%;   previous cath 2001 normal ef 65%   CARPAL TUNNEL RELEASE Bilateral 1994   CATARACT EXTRACTION W/ INTRAOCULAR LENS IMPLANT Bilateral 2017   CHOLECYSTECTOMY     CHOLECYSTECTOMY, LAPAROSCOPIC  1993   COLONOSCOPY     EAR BIOPSY Left    FINGER SURGERY Left 2018   thumb   FOOT SURGERY     FOOT TENDON SURGERY Right    early 2000s   FRACTURE SURGERY Left  02/13/2020   left femur   HAND SURGERY Right 04/2022   HARDWARE REMOVAL Left 03/12/2021   Procedure: HARDWARE REMOVAL;  Surgeon: Sharl Selinda Dover, MD;  Location: [facility];  Service: Orthopedics;  Laterality: Left;  60 MINS   JOINT REPLACEMENT     LEFT HEART CATH AND CORONARY ANGIOGRAPHY N/A 12/07/2023   Procedure: LEFT HEART CATH AND CORONARY ANGIOGRAPHY;  Surgeon: Verlin Lonni BIRCH, MD;  Location: [facility] INVASIVE CV LAB;  Service: Cardiovascular;  Laterality: N/A;   LUMBAR LAMINECTOMY/DECOMPRESSION MICRODISCECTOMY N/A 12/22/2022   Procedure: Lumbar Two-Lumbar Three, Lumbar Three-Lumbar Four Lumbar Decompression;  Surgeon: Colon Shove, MD;  Location: [facility] OR;  Service: Neurosurgery;  Laterality: N/A;  RM 20 3C   ORIF ANKLE FRACTURE Left 02/26/2020   Procedure: OPEN REDUCTION INTERNAL FIXATION (ORIF) ANKLE FRACTURE;  Surgeon: Sharl Selinda Dover, MD;  Location: [facility];  Service: Orthopedics;  Laterality: Left;  90 mins   TONSILLECTOMY AND ADENOIDECTOMY Bilateral    TOTAL KNEE ARTHROPLASTY Left 03/2005   TOTAL VAGINAL HYSTERECTOMY  1988   per pt still has ovaries    Current Medications: Active Medications[1]   Allergies:   Loxapine succinate, Macrodantin  [nitrofurantoin  macrocrystal], Naproxen sodium, Telithromycin, Amoxapine and related, Darvon, Levothyroxine  sodium, Amoxapine, Belsomra [suvorexant], Clavulanic acid, Cymbalta [duloxetine hcl], Duloxetine, Gabapentin , Jardiance [empagliflozin], Lyrica  [pregabalin ], Naproxen, Ozempic  (0.25 or 0.5 mg-dose) [semaglutide (0.25 or 0.5mg -dos)], Semaglutide , Amoxicillin , and Propoxyphene   Social History   Socioeconomic History   Marital status: Married    Spouse name: Fairy   Number of children: Not on file   Years of education: Not on file   Highest education level: Associate degree: occupational, scientist, product/process development, or vocational program  Occupational History   Not on file  Tobacco Use   Smoking status: Never   Smokeless  tobacco: Never  Vaping Use   Vaping status: Never Used  Substance and Sexual Activity   Alcohol use: No   Drug use: Never   Sexual activity: Not Currently    Birth control/protection: Surgical    Comment: Hysterectomy  Other Topics Concern   Not on file  Social History Narrative   Lives with spouse in [place].  2 grown daughters.   Retired print production planner     Social Drivers of Health   Tobacco Use: Low Risk (09/27/2024)  Patient History    Smoking Tobacco Use: Never    Smokeless Tobacco Use: Never    Passive Exposure: Not on file  Financial Resource Strain: Low Risk (08/26/2024)   Overall Financial Resource Strain (CARDIA)    Difficulty of Paying Living Expenses: Not very hard  Food Insecurity: No Food Insecurity (08/26/2024)   Epic    Worried About Radiation Protection Practitioner of Food in the Last Year: Never true    Ran Out of Food in the Last Year: Never true  Transportation Needs: No Transportation Needs (08/26/2024)   Epic    Lack of Transportation (Medical): No    Lack of Transportation (Non-Medical): No  Physical Activity: Insufficiently Active (08/26/2024)   Exercise Vital Sign    Days of Exercise per Week: 1 day    Minutes of Exercise per Session: 30 min  Stress: Stress Concern Present (08/26/2024)   [employer] of Occupational Health - Occupational Stress Questionnaire    Feeling of Stress: To some extent  Social Connections: Moderately Integrated (08/26/2024)   Social Connection and Isolation Panel    Frequency of Communication with Friends and Family: More than three times a week    Frequency of Social Gatherings with Friends and Family: Once a week    Attends Religious Services: More than 4 times per year    Active Member of Clubs or Organizations: No    Attends Engineer, Structural: Not on file    Marital Status: Married  Depression (PHQ2-9): Low Risk (06/05/2024)   Depression (PHQ2-9)    PHQ-2 Score: 0  Recent Concern: Depression (PHQ2-9) - Medium Risk  (03/29/2024)   Depression (PHQ2-9)    PHQ-2 Score: 8  Alcohol Screen: Low Risk (03/30/2023)   Alcohol Screen    Last Alcohol Screening Score (AUDIT): 0  Housing: Low Risk (08/26/2024)   Epic    Unable to Pay for Housing in the Last Year: No    Number of Times Moved in the Last Year: 0    Homeless in the Last Year: No  Utilities: Not At Risk (01/23/2024)   AHC Utilities    Threatened with loss of utilities: No  Health Literacy: Adequate Health Literacy (03/30/2023)   B1300 Health Literacy    Frequency of need for help with medical instructions: Never     Family History: The patient's family history includes Anemia in her mother; Antithrombin III deficiency in her mother; Cirrhosis in her mother; Coronary artery disease in her father; Diabetes in her father and sister; Heart attack in her brother, father, mother, and sister; Hypertension in her brother, father, and sister; Kidney disease in her father; Lung cancer in her brother; Protein S deficiency in her daughter; Ulcerative colitis in her mother. There is no history of Stroke, Colitis, Colon polyps, Esophageal cancer, Liver cancer, Pancreatic cancer, Rectal cancer, Stomach cancer, or Breast cancer. ROS:   Please see the history of present illness.    All 14 point review of systems negative except as described per history of present illness  EKGs/Labs/Other Studies Reviewed:    EKG Interpretation Date/Time:  Friday September 27 2024 09:17:15 EST Ventricular Rate:  64 PR Interval:  164 QRS Duration:  110 QT Interval:  390 QTC Calculation: 402 R Axis:   20  Text Interpretation: Sinus rhythm with marked sinus arrhythmia Nonspecific ST abnormality When compared with ECG of 13-Sep-2024 09:09, No significant change was found Confirmed by Bernie Charleston [phone]) on 09/27/2024 9:18:23 AM    Recent Labs: 03/19/2024: BNP 331.4 06/17/2024: NT-Pro  BNP 766 08/26/2024: ALT 24 09/13/2024: Hemoglobin 11.3; Magnesium  1.8; Platelets 228; TSH  3.730 09/26/2024: BUN 33; Creatinine, Ser 1.05; Potassium 4.4; Sodium 138  Recent Lipid Panel    Component Value Date/Time   CHOL 115 08/26/2024 0804   TRIG 45 08/26/2024 0804   HDL 63 08/26/2024 0804   CHOLHDL 1.8 08/26/2024 0804   LDLCALC 41 08/26/2024 0804    Physical Exam:    VS:  BP 102/60   Pulse 64   Ht 5' 2 (1.575 m)   Wt 146 lb (66.2 kg)   SpO2 98%   BMI 26.70 kg/m     Wt Readings from Last 3 Encounters:  09/27/24 146 lb (66.2 kg)  09/17/24 144 lb 6.4 oz (65.5 kg)  09/13/24 147 lb (66.7 kg)     GEN:  Well nourished, well developed in no acute distress HEENT: Normal NECK: No JVD; No carotid bruits LYMPHATICS: No lymphadenopathy CARDIAC: RRR, no murmurs, no rubs, no gallops RESPIRATORY:  Clear to auscultation without rales, wheezing or rhonchi  ABDOMEN: Soft, non-tender, non-distended MUSCULOSKELETAL:  No edema; No deformity  SKIN: Warm and dry LOWER EXTREMITIES: no swelling NEUROLOGIC:  Alert and oriented x 3 PSYCHIATRIC:  Normal affect   ASSESSMENT:    1. Essential hypertension   2. PVC's (premature ventricular contractions)   3. Chronic diastolic congestive heart failure (HCC)   4. Primary hypertension   5. Dilated cardiomyopathy (HCC)   6. Ventricular premature depolarization    PLAN:    In order of problems listed above:  Frequent ventricular ectopy nonobstructive on the EKG, QT interval QRS complex duration unchanged, continue present dose of medication. Relative hypotension.  I will reduce dose of amlodipine  to 5 mg daily hopefully that will make her feel better. Systolic congestive heart failure.  Compensated on physical exam, she complained of having pain in her left leg that she has a prolonged period of time.  She did have some injection to the back which seems to be helping a little bit but not completely. High risk medication use Flecainide .  EKG unchanged continue   Medication Adjustments/Labs and Tests Ordered: Current medicines are  reviewed at length with the patient today.  Concerns regarding medicines are outlined above.  Orders Placed This Encounter  Procedures   EKG 12-Lead   Medication changes: No orders of the defined types were placed in this encounter.   Signed, Lamar DOROTHA Fitch, MD, [facility] 09/27/2024 9:34 AM    [facility]    [1]  Current Meds  Medication Sig   Accu-Chek Softclix Lancets lancets SMARTSIG:Topical 1-4 Times Daily   amLODipine  (NORVASC ) 10 MG tablet Take 10 mg by mouth daily.   aspirin  EC (ASPIRIN  LOW DOSE) 81 MG tablet Take 1 tablet (81 mg total) by mouth daily. Swallow whole.   atorvastatin  (LIPITOR ) 80 MG tablet TAKE 1 TABLET(80 MG) BY MOUTH DAILY   blood glucose meter kit and supplies KIT Dispense based on patient and insurance preference. Use up to four times daily as directed.   carboxymethylcellulose (REFRESH PLUS) 0.5 % SOLN Place 1 drop into both eyes 3 (three) times daily as needed (dry eyes).   carvedilol  (COREG ) 6.25 MG tablet Take 1 tablet (6.25 mg total) by mouth 2 (two) times daily.   Cholecalciferol (VITAMIN D3) 50 MCG (2000 UT) TABS Take 4,000 Units by mouth in the morning and at bedtime.   cyanocobalamin  (VITAMIN B12) 1000 MCG tablet Take 1,000 mcg by mouth daily with lunch.   ELIQUIS   5 MG TABS tablet TAKE 1 TABLET(5 MG) BY MOUTH TWICE DAILY   ferrous sulfate  325 (65 FE) MG tablet Take 325 mg by mouth daily with lunch.   Finerenone  (KERENDIA ) 10 MG TABS Take 1 tablet (10 mg total) by mouth daily.   flecainide  (TAMBOCOR ) 50 MG tablet Take 1 tablet (50 mg total) by mouth 2 (two) times daily.   fluticasone  (FLONASE ) 50 MCG/ACT nasal spray Place 2 sprays into both nostrils daily.   furosemide  (LASIX ) 20 MG tablet Take 1 tablet (20 mg total) by mouth daily. TAKE 1 TABLET(20 MG) BY MOUTH DAILY   GEMTESA  75 MG TABS Take 1 tablet (75 mg total) by mouth at bedtime.   glucose blood (ACCU-CHEK GUIDE TEST) test strip Use to check blood sugar up to 4 times per  day   insulin  aspart (NOVOLOG  FLEXPEN) 100 UNIT/ML FlexPen Max daily 30 units   insulin  glargine, 1 Unit Dial , (TOUJEO  SOLOSTAR) 300 UNIT/ML Solostar Pen Inject 8 Units into the skin daily in the afternoon.   Insulin  Pen Needle 32G X 4 MM MISC 1 Device by Does not apply route in the morning, at noon, in the evening, and at bedtime.   magnesium  oxide (MAG-OX) 400 (240 Mg) MG tablet Take 2 tablets (800 mg total) by mouth daily.   methenamine  (HIPREX ) 1 g tablet Take 1 tablet (1 g total) by mouth 2 (two) times daily with a meal.   olmesartan  (BENICAR ) 40 MG tablet Take 1 tablet (40 mg total) by mouth daily.   ondansetron  (ZOFRAN -ODT) 8 MG disintegrating tablet Take 1 tablet (8 mg total) by mouth every 8 (eight) hours as needed for nausea or vomiting.   pantoprazole  (PROTONIX ) 40 MG tablet TAKE 1 TABLET(40 MG) BY MOUTH EVERY MORNING   predniSONE  (DELTASONE ) 20 MG tablet Take 1 tablet (20 mg total) by mouth daily with breakfast.   pyridoxine  (B-6) 100 MG tablet Take 100 mg by mouth daily with lunch.   sodium chloride  (OCEAN) 0.65 % SOLN nasal spray Place 1 spray into both nostrils as needed for congestion.   zolpidem  (AMBIEN ) 5 MG tablet TAKE 1 TABLET(5 MG) BY MOUTH AT BEDTIME AS NEEDED FOR SLEEP   "

## 2024-09-28 LAB — CBC
Hematocrit: 36.6 % (ref 34.0–46.6)
Hemoglobin: 11.6 g/dL (ref 11.1–15.9)
MCH: 30.7 pg (ref 26.6–33.0)
MCHC: 31.7 g/dL (ref 31.5–35.7)
MCV: 97 fL (ref 79–97)
Platelets: 297 10*3/uL (ref 150–450)
RBC: 3.78 x10E6/uL (ref 3.77–5.28)
RDW: 12 % (ref 11.7–15.4)
WBC: 8.1 10*3/uL (ref 3.4–10.8)

## 2024-09-28 LAB — COMPREHENSIVE METABOLIC PANEL WITH GFR
ALT: 24 [IU]/L (ref 0–32)
AST: 21 [IU]/L (ref 0–40)
Albumin: 3.7 g/dL — ABNORMAL LOW (ref 3.8–4.8)
Alkaline Phosphatase: 101 [IU]/L (ref 49–135)
BUN/Creatinine Ratio: 33 — ABNORMAL HIGH (ref 12–28)
BUN: 36 mg/dL — ABNORMAL HIGH (ref 8–27)
Bilirubin Total: 0.4 mg/dL (ref 0.0–1.2)
CO2: 23 mmol/L (ref 20–29)
Calcium: 9.2 mg/dL (ref 8.7–10.3)
Chloride: 99 mmol/L (ref 96–106)
Creatinine, Ser: 1.09 mg/dL — ABNORMAL HIGH (ref 0.57–1.00)
Globulin, Total: 2.6 g/dL (ref 1.5–4.5)
Glucose: 321 mg/dL — ABNORMAL HIGH (ref 70–99)
Potassium: 5.1 mmol/L (ref 3.5–5.2)
Sodium: 136 mmol/L (ref 134–144)
Total Protein: 6.3 g/dL (ref 6.0–8.5)
eGFR: 51 mL/min/{1.73_m2} — ABNORMAL LOW

## 2024-10-01 ENCOUNTER — Ambulatory Visit: Payer: Self-pay | Admitting: Cardiology

## 2024-10-01 DIAGNOSIS — I5021 Acute systolic (congestive) heart failure: Secondary | ICD-10-CM

## 2024-10-02 ENCOUNTER — Telehealth: Payer: Self-pay

## 2024-10-02 LAB — OPHTHALMOLOGY REPORT-SCANNED

## 2024-10-02 NOTE — Telephone Encounter (Signed)
 Pt viewed lab results on My Chart per Dr. Karry note. Routed to PCP.

## 2024-10-03 NOTE — Telephone Encounter (Signed)
 Labs on 09/27/24 have Creatinine at 1.09. This is out of Normal  In accordance with refill protocols, please review and address the following requirements before this medication refill can be authorized:  Labs

## 2024-10-04 ENCOUNTER — Ambulatory Visit: Admitting: Internal Medicine

## 2024-10-04 ENCOUNTER — Encounter: Payer: Self-pay | Admitting: Internal Medicine

## 2024-10-04 VITALS — BP 128/64 | Ht 62.0 in | Wt 141.0 lb

## 2024-10-04 DIAGNOSIS — E1165 Type 2 diabetes mellitus with hyperglycemia: Secondary | ICD-10-CM

## 2024-10-04 DIAGNOSIS — E1129 Type 2 diabetes mellitus with other diabetic kidney complication: Secondary | ICD-10-CM

## 2024-10-04 DIAGNOSIS — N1832 Chronic kidney disease, stage 3b: Secondary | ICD-10-CM

## 2024-10-04 DIAGNOSIS — E1140 Type 2 diabetes mellitus with diabetic neuropathy, unspecified: Secondary | ICD-10-CM

## 2024-10-04 MED ORDER — SITAGLIPTIN PHOSPHATE 50 MG PO TABS: 50.0000 mg | ORAL_TABLET | Freq: Every day | ORAL | 3 refills | Status: DC

## 2024-10-04 NOTE — Progress Notes (Signed)
 " Name: Pamela [name]  MRN/ DOB: [id number], [birth year]   Age/ Sex: 79 y.o., female    PCP: Sherre Clapper, MD   Reason for Endocrinology Evaluation: Type 2 Diabetes Mellitus     Date of Initial Endocrinology Visit: 03/30/2022    PATIENT IDENTIFIER: Pamela [name] is a 79 y.o. female with a past medical history of T2DM, GERD, A. Fib . The patient presented for initial endocrinology clinic visit on 10/04/2024 for consultative assistance with her diabetes management.    HPI: Pamela [name] was    Diagnosed with DM in 1985 Prior Medications tried/Intolerance: Metformin - tired .Ozempic  - diarrhea , Rybelsus  caused nausea  Hemoglobin A1c has ranged from 7.2% in 2023, peaking at 9.8% in 2022.   Of note, the pt was seen by Dr. Kassie in 2021 for hyponatremia . Adrenal insufficiency was excluded    She was started on Farxiga  in July 2023 Started Trulicity  05/2023  I discontinued Toujeo  09/2023 as she was on a small dose of 6 units and was having hypoglycemia overnight restarted 05/2024  Farxiga  was discontinued by the hospital 2025  Discontinued trulicity  due to N/V 05/2024  SUBJECTIVE:   During the last visit (06/04/2024): A1c %    Today (10/04/24): Pamela [name] is here for a follow up on diabetes management. She checks her blood sugars multiple times daily through CGM. 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 Dolan (nephrology) She follows with cardiology for CAD, A-fib, and CHF, amlodipine  dose was reduced due to hypotension.   Continues with right knee intra-articular injection , last one was a few weeks   Nausea and vomiting have resolved  Continues with occasional heartburn  No constipation or diarrhea     HOME DIABETES REGIMEN: Toujeo  8 units Novolog   6 units TIDQAC (4 units with a  small meal) Correction factor: NovoLog  (BG -120/50)    Statin: yes ACE-I/ARB: yes    CONTINUOUS GLUCOSE MONITORING RECORD INTERPRETATION                       Glycemic patterns summary: BGs have been noted to be elevated overnight and fluctuate during the day Hyperglycemic episodes post prandial  Hypoglycemic episodes occurred after bolus  Overnight periods: High    DIABETIC COMPLICATIONS: Microvascular complications:  Macular degeneration , CKD III Denies:  Last eye exam: Completed 10/02/2024  Macrovascular complications:   Denies: CAD, PVD, CVA   PAST HISTORY: Past Medical History:  Past Medical History:  Diagnosis Date   Abnormal CT of the chest 07/20/2023   Acquired thrombophilia 06/27/2021   Acute combined systolic and diastolic heart failure (HCC) 12/08/2023   Acute cystitis without hematuria 06/12/2022   Acute heart failure with mildly reduced ejection fraction (HFmrEF, 41-49%) (HCC) 12/07/2023   Acute LUQ pain 02/12/2024   Acute on chronic diastolic (congestive) heart failure (HCC) 12/07/2023   Anemia in chronic kidney disease 09/06/2023   Anticoagulated on Coumadin     chronic--- managed by hematology/ oncology,   Arrhythmia    Asthma    Asthma, mild intermittent    followed by pcp--- per pt last exacerbation winter 2021 w/ acute bronchitis   Athscl heart disease of native coronary artery w/o ang pctrs 09/06/2023   Atrial fibrillation [I48.91] 10/01/2014   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, NYHA class 1 (HCC)  05/23/2011   Bilateral leg cramps    Blood dyscrasia    Bunion 08/23/2021   Cervical radiculopathy 07/19/2021   Cervical spondylosis 09/06/2023   Chest pain at rest 01/22/2024   Chronic constipation    Chronic diastolic congestive heart failure (HCC) 12/09/2020   Chronic pain of left ankle 03/31/2024   CKD (chronic kidney disease), stage III (HCC)    CKD stage 3a, GFR 45-59 ml/min (HCC) 11/05/2023    Closed bimalleolar fracture of left ankle 02/26/2020   Closed fracture of fifth metatarsal bone of left foot 12/06/2021   Closed fracture of fourth metatarsal bone 05/07/2021   Clotting disorder 1991   Protein S Deficency   Compression fracture of L1 lumbar vertebra (HCC) 12/17/2022   DDD (degenerative disc disease), cervical    w/ spondylosis,  per pt last steroid injection 06/ 2022   DDD (degenerative disc disease), lumbosacral    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   DJD (degenerative joint disease)    DVT (deep venous thrombosis) (HCC) 07/30/2013   Dyspnea    occasionally   Dyspnea on exertion 03/03/2013   Dysrhythmia    Dysuria 04/17/2023   Elevated rheumatoid factor 05/03/2024   Encntr for surgical aftcr following surgery on the circ sys 09/06/2023   Encounter for immunization 07/20/2023   Encounter for Medicare annual wellness exam 04/04/2024   Encounter for orthopedic follow-up care 03/10/2020   Flank pain 02/12/2024   GERD (gastroesophageal reflux disease)    Hammer toe 08/23/2021   Heart murmur    History of cardiac murmur as a child    History of DVT of lower extremity    left lower extremity in 1980s, fell when bowling   History of falling 09/06/2023   History of pulmonary embolus (PE) 1993   per pt left lung post op 2 wks cholecystectomy   History of rheumatic fever as a child    per last echo 12-31-2019 no valvular issues   History of TIA (transient ischemic attack)    2014 and 2018 or 2019,  per pt no residual   HTN (hypertension)    followed by pcp   Hyperkalemia 03/31/2024   Hyperlipidemia    Hypertensive heart and chronic kidney disease with heart failure and stage 1 through stage 4 chronic kidney disease, or unspecified chronic kidney disease (HCC) 12/12/2023   Hyponatremia 02/06/2019   Na 126 on no obvious meds to cause it with last na 133 in 2014      Hypothyroidism    IDA (iron deficiency anemia)    Joint pain 10/24/2021   Low back strain  08/09/2018   Lumbar radiculopathy 08/13/2021   Macular degeneration of both eyes    Mild obstructive sleep apnea    per pt dx 2017 tried to uses cpap but intolerant   Mixed incontinence urge and stress    urologist--- dr marda   Muscle pain 10/24/2021   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   OA (osteoarthritis) 07/06/2018   Osteoporosis    Other cardiomyopathies (HCC) 09/06/2023   Other instability, left foot 03/28/2024   Other microscopic hematuria 03/31/2024   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   PAF (paroxysmal atrial fibrillation) (HCC) 10/01/2014   cardiologist--- dr dub tobb;   cardiac cath 02-18-2013 normal coronaries arteries, ef 50%, cath done since echo showed ef 30-35%; nucleat stress study 04/ 2020 normal , normal echo 04/ 2021,  event monitor 09-14-2020 rare ST/AT variable block   Pain in left  foot 12/17/2021   Pain of right hip joint 08/11/2022   Personal history of pneumonia (recurrent) 09/06/2023   Personal history of urinary (tract) infections 09/06/2023   Pneumonia    PONV (postoperative nausea and vomiting)    Presence of left artificial knee joint 09/06/2023   Protein S deficiency    followed by hemotology/ oncology-- dr d. ezzard ([facility]) dx 1980s;  prior DVT left lower leg 1980s and left lung PE 1993; chronic  coumadin  since 1980s   PVC's (premature ventricular contractions)    followed by cardiology   S/P cardiac catheterization 02/2013   Normal coronaries; low normal EF at 50%   Skin cancer screening 02/12/2024   Solitary pulmonary nodule 09/06/2023   Solitary pulmonary nodule on lung CT 02/06/2019   First noted 01/13/2014 > no change as of 12/21/2018   Spinal stenosis of lumbar region 07/19/2021   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/22/2022   Spondylolisthesis, lumbar region 08/09/2018   Sprain of right ankle 10/13/2023   Stage 3b  chronic kidney disease (HCC) 12/17/2022   Stroke (HCC)    TIA in 2018 or 2019   Syncope 01/22/2024   Transient ischemic attack 07/30/2013   Traumatic arthritis of left ankle 02/12/2024   Type 2 diabetes mellitus treated with insulin  (HCC)    endocrilogist--- whitney reardon NP     (03-10-2021  pt continuously checks blood sugar throughout the day w/ Libre, fasting sugar --- 69--200)   Type 2 diabetes mellitus with diabetic neuropathy, with long-term current use of insulin  (HCC) 06/15/2021   Unilateral primary osteoarthritis, right knee 07/06/2018   Urinary frequency 02/12/2024   UTI (urinary tract infection) 01/23/2024   UTI (urinary tract infection) due to Enterococcus 01/22/2024   Ventricular premature depolarization 09/06/2023   Wears glasses    Wears hearing aid in both ears    Past Surgical History:  Past Surgical History:  Procedure Laterality Date   ABDOMINAL HYSTERECTOMY     BLEPHAROPLASTY     CARDIAC CATHETERIZATION  02/18/2013   @ [facility]  by Dr Jordan;  normal coronaries w/ preserved LVF, ef 50%;   previous cath 2001 normal ef 65%   CARPAL TUNNEL RELEASE Bilateral 1994   CATARACT EXTRACTION W/ INTRAOCULAR LENS IMPLANT Bilateral 2017   CHOLECYSTECTOMY     CHOLECYSTECTOMY, LAPAROSCOPIC  1993   COLONOSCOPY     EAR BIOPSY Left    FINGER SURGERY Left 2018   thumb   FOOT SURGERY     FOOT TENDON SURGERY Right    early 2000s   FRACTURE SURGERY Left 02/13/2020   left femur   HAND SURGERY Right 04/2022   HARDWARE REMOVAL Left 03/12/2021   Procedure: HARDWARE REMOVAL;  Surgeon: Sharl Selinda Dover, MD;  Location: [facility];  Service: Orthopedics;  Laterality: Left;  60 MINS   JOINT REPLACEMENT     LEFT HEART CATH AND CORONARY ANGIOGRAPHY N/A 12/07/2023   Procedure: LEFT HEART CATH AND CORONARY ANGIOGRAPHY;  Surgeon: Verlin Lonni BIRCH, MD;  Location: [facility] INVASIVE CV LAB;  Service: Cardiovascular;  Laterality: N/A;   LUMBAR LAMINECTOMY/DECOMPRESSION  MICRODISCECTOMY N/A 12/22/2022   Procedure: Lumbar Two-Lumbar Three, Lumbar Three-Lumbar Four Lumbar Decompression;  Surgeon: Colon Shove, MD;  Location: [facility] OR;  Service: Neurosurgery;  Laterality: N/A;  RM 20 3C   ORIF ANKLE FRACTURE Left 02/26/2020   Procedure: OPEN REDUCTION INTERNAL FIXATION (ORIF) ANKLE FRACTURE;  Surgeon: Sharl Selinda Dover, MD;  Location: [facility] OR;  Service: Orthopedics;  Laterality: Left;  90 mins   TONSILLECTOMY AND ADENOIDECTOMY Bilateral    TOTAL KNEE ARTHROPLASTY Left 03/2005   TOTAL VAGINAL HYSTERECTOMY  1988   per pt still has ovaries    Social History:  reports that she has never smoked. She has never used smokeless tobacco. She reports that she does not drink alcohol and does not use drugs. Family History:  Family History  Problem Relation Age of Onset   Cirrhosis Mother    Antithrombin III deficiency Mother        multiple emboli   Ulcerative colitis Mother    Anemia Mother    Heart attack Mother        Anti thrombin  3, Cirrhosis of liver Short Bowel syndrome. Passed age 54 as result of blood clots/obstruction of bowels and gangreen.   Diabetes Father    Coronary artery disease Father    Heart attack Father        Dempsey- passed shortly after bypass surgery and Kidney failure. Age 79   Hypertension Father        Both Father ,brother and Sister Heart disease, diabetes, also   Kidney disease Father    Hypertension Sister        Heart disease and cancer also.   Diabetes Sister    Heart attack Sister        Ivy) has Protein S. deficency, Diabetis, Micro Vascular Disease, Heart Trouble with 4 Stints. Surgical removal breast cancer   Protein S deficiency Daughter    Heart attack Brother        Passed as result of Lung Cancer and Heart trouble   Hypertension Brother        Heart disease, cancer,  also   Lung cancer Brother    Stroke Neg Hx    Colitis Neg Hx    Colon polyps Neg Hx    Esophageal cancer Neg Hx    Liver cancer Neg Hx    Pancreatic  cancer Neg Hx    Rectal cancer Neg Hx    Stomach cancer Neg Hx    Breast cancer Neg Hx      HOME MEDICATIONS: Allergies as of 10/04/2024       Reactions   Loxapine Succinate Hives   Macrodantin  [nitrofurantoin  Macrocrystal] Other (See Comments)   Pulmonary fibrosis   Naproxen Sodium Anaphylaxis, Hives   Telithromycin Nausea And Vomiting, Rash   Other Reaction(s): Unknown   Amoxapine And Related Hives   Darvon Nausea And Vomiting   Levothyroxine  Sodium Photosensitivity, Other (See Comments)   Blurry and double vision   Amoxapine    Belsomra [suvorexant] Other (See Comments)   Nightmares   Clavulanic Acid    Other Reaction(s): Not available   Cymbalta [duloxetine Hcl] Other (See Comments)   Blackouts   Duloxetine    Gabapentin  Other (See Comments)   Blurry vision   Jardiance [empagliflozin]    UTIs   Lyrica  [pregabalin ]    Makes her sedated   Naproxen Hives   Other Reaction(s): hives and GI upset   Ozempic  (0.25 Or 0.5 Mg-dose) [semaglutide (0.25 Or 0.5mg -dos)] Diarrhea   Semaglutide  Diarrhea   Rybelsus    Amoxicillin  Rash   Other Reaction(s): hives and GI upset   Propoxyphene Nausea And Vomiting   Other Reaction(s): hives and GI upset        Medication List        Accurate as of October 04, 2024  8:54 AM. If you have any questions, ask your nurse or doctor.  STOP taking these medications    predniSONE  20 MG tablet Commonly known as: DELTASONE  Stopped by: Donell Butts, MD       TAKE these medications    Accu-Chek Guide Test test strip Generic drug: glucose blood Use to check blood sugar up to 4 times per day   Accu-Chek Softclix Lancets lancets SMARTSIG:Topical 1-4 Times Daily   amLODipine  5 MG tablet Commonly known as: NORVASC  Take 1 tablet (5 mg total) by mouth daily.   aspirin  EC 81 MG tablet Commonly known as: Aspirin  Low Dose Take 1 tablet (81 mg total) by mouth daily. Swallow whole.   atorvastatin  80 MG  tablet Commonly known as: LIPITOR  TAKE 1 TABLET(80 MG) BY MOUTH DAILY   blood glucose meter kit and supplies Kit Dispense based on patient and insurance preference. Use up to four times daily as directed.   carboxymethylcellulose 0.5 % Soln Commonly known as: REFRESH PLUS Place 1 drop into both eyes 3 (three) times daily as needed (dry eyes).   carvedilol  6.25 MG tablet Commonly known as: COREG  Take 1 tablet (6.25 mg total) by mouth 2 (two) times daily.   cyanocobalamin  1000 MCG tablet Commonly known as: VITAMIN B12 Take 1,000 mcg by mouth daily with lunch.   Eliquis  5 MG Tabs tablet Generic drug: apixaban  TAKE 1 TABLET(5 MG) BY MOUTH TWICE DAILY   ferrous sulfate  325 (65 FE) MG tablet Take 325 mg by mouth daily with lunch.   flecainide  50 MG tablet Commonly known as: TAMBOCOR  Take 1 tablet (50 mg total) by mouth 2 (two) times daily.   fluticasone  50 MCG/ACT nasal spray Commonly known as: FLONASE  Place 2 sprays into both nostrils daily.   furosemide  20 MG tablet Commonly known as: LASIX  Take 1 tablet (20 mg total) by mouth daily. TAKE 1 TABLET(20 MG) BY MOUTH DAILY   Gemtesa  75 MG Tabs Generic drug: Vibegron  Take 1 tablet (75 mg total) by mouth at bedtime.   Insulin  Pen Needle 32G X 4 MM Misc 1 Device by Does not apply route in the morning, at noon, in the evening, and at bedtime.   Kerendia  10 MG Tabs Generic drug: Finerenone  Take 1 tablet (10 mg total) by mouth daily.   magnesium  oxide 400 (240 Mg) MG tablet Commonly known as: MAG-OX Take 2 tablets (800 mg total) by mouth daily.   methenamine  1 g tablet Commonly known as: HIPREX  Take 1 tablet (1 g total) by mouth 2 (two) times daily with a meal.   NovoLOG  FlexPen 100 UNIT/ML FlexPen Generic drug: insulin  aspart Max daily 30 units   olmesartan  40 MG tablet Commonly known as: BENICAR  Take 1 tablet (40 mg total) by mouth daily.   ondansetron  8 MG disintegrating tablet Commonly known as:  ZOFRAN -ODT Take 1 tablet (8 mg total) by mouth every 8 (eight) hours as needed for nausea or vomiting.   pantoprazole  40 MG tablet Commonly known as: PROTONIX  TAKE 1 TABLET(40 MG) BY MOUTH EVERY MORNING   pyridoxine  100 MG tablet Commonly known as: B-6 Take 100 mg by mouth daily with lunch.   sodium chloride  0.65 % Soln nasal spray Commonly known as: OCEAN Place 1 spray into both nostrils as needed for congestion.   Toujeo  SoloStar 300 UNIT/ML Solostar Pen Generic drug: insulin  glargine (1 Unit Dial ) Inject 8 Units into the skin daily in the afternoon.   Vitamin D3 50 MCG (2000 UT) Tabs Take 4,000 Units by mouth in the morning and at bedtime.   zolpidem  5 MG tablet  Commonly known as: AMBIEN  TAKE 1 TABLET(5 MG) BY MOUTH AT BEDTIME AS NEEDED FOR SLEEP         ALLERGIES: Allergies  Allergen Reactions   Loxapine Succinate Hives   Macrodantin  [Nitrofurantoin  Macrocrystal] Other (See Comments)    Pulmonary fibrosis   Naproxen Sodium Anaphylaxis and Hives   Telithromycin Nausea And Vomiting and Rash    Other Reaction(s): Unknown   Amoxapine And Related Hives   Darvon Nausea And Vomiting   Levothyroxine  Sodium Photosensitivity and Other (See Comments)    Blurry and double vision   Amoxapine    Belsomra [Suvorexant] Other (See Comments)    Nightmares   Clavulanic Acid     Other Reaction(s): Not available   Cymbalta [Duloxetine Hcl] Other (See Comments)    Blackouts   Duloxetine    Gabapentin  Other (See Comments)    Blurry vision   Jardiance [Empagliflozin]     UTIs   Lyrica  [Pregabalin ]     Makes her sedated   Naproxen Hives    Other Reaction(s): hives and GI upset   Ozempic  (0.25 Or 0.5 Mg-Dose) [Semaglutide (0.25 Or 0.5mg -Dos)] Diarrhea   Semaglutide  Diarrhea    Rybelsus    Amoxicillin  Rash    Other Reaction(s): hives and GI upset   Propoxyphene Nausea And Vomiting    Other Reaction(s): hives and GI upset     REVIEW OF SYSTEMS: A comprehensive ROS was  conducted with the patient and is negative except as per HPI     OBJECTIVE:   VITAL SIGNS: BP 128/64   Ht 5' 2 (1.575 m)   Wt 141 lb (64 kg)   BMI 25.79 kg/m    PHYSICAL EXAM:  General: Pt appears well and is in NAD  Lungs: Clear with good BS bilat   Heart: RRR   Extremities:  Lower extremities - No pretibial edema.   Neuro: MS is good with appropriate affect, pt is alert and Ox3    DM Foot Exam 10/04/2024  The skin of the feet is intact without sores or ulcerations. The pedal pulses are 2+ on right and 2+ on left. The sensation is intact to a screening 5.07, 10 gram monofilament bilaterally     DATA REVIEWED:  Lab Results  Component Value Date   HGBA1C 7.7 (H) 08/26/2024   HGBA1C 7.6 (H) 05/22/2024   HGBA1C 7.3 (H) 01/31/2024    Latest Reference Range & Units 09/27/24 09:51  Comprehensive metabolic panel with GFR  Rpt !  Sodium 134 - 144 mmol/L 136  Potassium 3.5 - 5.2 mmol/L 5.1  Chloride 96 - 106 mmol/L 99  CO2 20 - 29 mmol/L 23  Glucose 70 - 99 mg/dL 678 (H)  BUN 8 - 27 mg/dL 36 (H)  Creatinine 9.42 - 1.00 mg/dL 8.90 (H)  Calcium  8.7 - 10.3 mg/dL 9.2  BUN/Creatinine Ratio 12 - 28  33 (H)  eGFR >59 mL/min/1.73 51 (L)  Alkaline Phosphatase 49 - 135 IU/L 101  Albumin  3.8 - 4.8 g/dL 3.7 (L)  AST 0 - 40 IU/L 21  ALT 0 - 32 IU/L 24  Total Protein 6.0 - 8.5 g/dL 6.3  Total Bilirubin 0.0 - 1.2 mg/dL 0.4    Old records , labs and images have been reviewed.    ASSESSMENT / PLAN / RECOMMENDATIONS:   1) Type 2 Diabetes Mellitus,Sub- Optimally controlled, With CKD III complications - Most recent A1c of 7.7 %. Goal A1c < 7.5 %.    -A1c has trended up, I suspect this is  due to lack of Trulicity  -Discontinue Trulicity  due to nausea and vomiting -She has reported intolerance to Ozempic  and Rybelsus  -We had discontinued Toujeo  due to overnight hypoglycemia on just 6 units 07/2023, but was restarted in September, 2025 after discontinuing Trulicity  - The patient  has been feeling dizzy due to multiple diuretic agent use, Farxiga  was discontinued during hospitalization 2025 due to syncope -She is NOT interested in insulin  pump technology - I have recommended adding Januvia  to see if that would bring her A1c to goal.  In reviewing CGM download, patient has been noted with postprandial hyperglycemia.  I would not make any changes to her insulin  regimen as long as she is going to start Januvia .  If she is unable to obtain the Januvia  I would suggest increasing NovoLog  at suppertime - She was advised to follow-up fingersticks in the setting of hypoglycemia on freestyle libre, no need to use fingerstick with normal or high BG's on freestyle libre  MEDICATIONS: Start Januvia  50 mg daily Take Toujeo  8 units daily Take NovoLog  6 units with each meal Continue correction factor : NovoLog  (BG -130/55)  EDUCATION / INSTRUCTIONS: BG monitoring instructions: Patient is instructed to check her blood sugars 3 times a day, before meals. Call [place] [facility] if: BG persistently < 70  I reviewed the Rule of 15 for the treatment of hypoglycemia in detail with the patient. Literature supplied.   2) Diabetic complications:  Eye: Does not have known diabetic retinopathy.  Neuro/ Feet: Does not have known diabetic peripheral neuropathy. Renal: Patient does  have known baseline CKD. She is  on an ACEI/ARB at present.  3) Microalbuminuria :  - I explained to the patient this is due to uncontrolled DM -Encouraged glucose control -Patient on Benicar    Follow-up in 4 months     Signed electronically by: Stefano Redgie Butts, MD  [facility]  [facility] [address] [place], [place] [address] Phone: [phone] FAX: [phone]   CC: Sherre Clapper, MD [address] [place] [address] Phone: [phone]  Fax: [phone]    Return to Endocrinology clinic as below: Future Appointments   Date Time Provider Department Center  10/04/2024  9:30 AM Rabia Argote, Donell Redgie, MD [facility] None  10/07/2024  4:00 PM Inocencio Soyla Lunger, MD [facility] None  10/09/2024 11:15 AM Norberto Thersia NOVAK, RD [facility] NDM  10/15/2024 10:15 AM Corey Rufus HERO, MD [facility] None  10/29/2024 10:40 AM Bernie Lamar PARAS, MD [facility] None  11/26/2024  8:00 AM [facility] SCHEDULE [facility]  12/04/2024  8:40 AM Sherre Clapper, MD [facility] [place]  06/05/2025  9:45 AM Ezzard Valaria LABOR, MD [facility] None  09/09/2025 10:30 AM Stoneking, Adine PARAS., MD [facility] None    "

## 2024-10-04 NOTE — Patient Instructions (Addendum)
 Start Januvia  50 mg, 1 tablet every morning  Continue Toujeo  8 units daily  Take  Novolog  6 units with each meal Correction Novolog : ADD extra units on insulin  to your meal-time Novolog  dose if your blood sugars are higher than 185. Use the scale below to help guide you:   Blood sugar before meal Number of units to inject  Less than 185 0 unit  186 - 240 1 units  241 - 295 2 units  296 - 350 3 units  351 - 405 4 units  406 - 460 5 units      HOW TO TREAT LOW BLOOD SUGARS (Blood sugar LESS THAN 70 MG/DL) Please follow the RULE OF 15 for the treatment of hypoglycemia treatment (when your (blood sugars are less than 70 mg/dL)   STEP 1: Take 15 grams of carbohydrates when your blood sugar is low, which includes:  3-4 GLUCOSE TABS  OR 3-4 OZ OF JUICE OR REGULAR SODA OR ONE TUBE OF GLUCOSE GEL    STEP 2: RECHECK blood sugar in 15 MINUTES STEP 3: If your blood sugar is still low at the 15 minute recheck --> then, go back to STEP 1 and treat AGAIN with another 15 grams of carbohydrates.

## 2024-10-07 ENCOUNTER — Encounter: Payer: Self-pay | Admitting: Cardiology

## 2024-10-07 ENCOUNTER — Ambulatory Visit: Admitting: Cardiology

## 2024-10-07 VITALS — BP 120/60 | HR 62 | Ht 62.0 in | Wt 147.4 lb

## 2024-10-07 DIAGNOSIS — I493 Ventricular premature depolarization: Secondary | ICD-10-CM

## 2024-10-07 DIAGNOSIS — I10 Essential (primary) hypertension: Secondary | ICD-10-CM | POA: Diagnosis not present

## 2024-10-07 DIAGNOSIS — D6869 Other thrombophilia: Secondary | ICD-10-CM | POA: Diagnosis not present

## 2024-10-07 DIAGNOSIS — I480 Paroxysmal atrial fibrillation: Secondary | ICD-10-CM

## 2024-10-07 MED ORDER — FUROSEMIDE 20 MG PO TABS: 20.0000 mg | ORAL_TABLET | Freq: Every day | ORAL | 3 refills | Status: AC

## 2024-10-08 ENCOUNTER — Telehealth: Payer: Self-pay

## 2024-10-08 ENCOUNTER — Other Ambulatory Visit: Payer: Self-pay | Admitting: Family Medicine

## 2024-10-08 NOTE — Telephone Encounter (Signed)
 Patient is going to increase novolog  and discontinue januvia  at this time.

## 2024-10-08 NOTE — Telephone Encounter (Signed)
 Spoke with patient and advised her of this information.  The major studies of the Januvia  has shown it to be neutral toward any cardiovascular effects.  When they compared Januvia  to another class of medication called SGLT2 inhibitors(Jardiance and Farxiga , they did notice the A-fib but that is because SGLT2 inhibitors are known to cause improvement in cardiovascular risk in general, so of course the people on Januvia  have shown an A-fib but is not because of the Januvia  itself I do believe this is due to a good effect of Jardiance/Farxiga  on the heart.    At the same time, I do not want the patient to take something that she is worried about because stress can affect the heart as well and in the meantime I would suggest if she wants to hold off on the Januvia  to increase NovoLog  to 8 units instead of 6 with each meal, plus sliding scale if needed

## 2024-10-09 ENCOUNTER — Encounter: Payer: Self-pay | Admitting: Skilled Nursing Facility1

## 2024-10-09 ENCOUNTER — Encounter: Admitting: Skilled Nursing Facility1

## 2024-10-09 DIAGNOSIS — E119 Type 2 diabetes mellitus without complications: Secondary | ICD-10-CM

## 2024-10-09 DIAGNOSIS — N1831 Chronic kidney disease, stage 3a: Secondary | ICD-10-CM

## 2024-10-09 NOTE — Progress Notes (Signed)
 Medical Nutrition Therapy  Appointment Start time:  11:10  Appointment End time:  12:30  Primary concerns today: How to eat with all of her different medical conditions   Referral diagnosis: n18.3, e11.29   NUTRITION ASSESSMENT    Clinical Medical Hx: CKD stage 3, DM,  Medications: Trujeo 8 units taken at lunch, novolog  8 units with sliding scale, Libre Labs: A1C 7.7 Notable Signs/Symptoms: none identified   Lifestyle & Dietary Hx  Pt states she has several doctors for each condition she has getting mixed messages about her diet for her conditions.   Pt had specific questions which were answered to her satisfaction.   CGM: 14 days 48% TIR, 32% 180-250 GMI 8.1  Estimated daily fluid intake:  oz Supplements:  Sleep:  Stress / self-care:  Current average weekly physical activity: ADL's  24-Hr Dietary Recall First Meal: 1/2 cup oatmeal  Snack:  Second Meal: egg salad and cabbage  Snack:  Third Meal:  Snack:  Beverages: water, skim milk   NUTRITION INTERVENTION  Nutrition education (E-1) on the following topics:  Provided medical nutrition therapy regarding the role of adequate energy intake in preventing protein-energy wasting, sarcopenia, and unintentional weight loss in older adults with CKD stage 3 (per KDOQI 2020 nutrition guidelines). Reinforced importance of maintaining caloric intake of ~30 kcal/kg/day (adjusted for clinical status, weight history, and comorbidities) to preserve lean body mass and functional status. Discussed strategies to optimize oral intake including small, frequent meals; incorporation of calorie-dense, nutrient-appropriate foods (e.g., unsaturated fats, fortified foods, oral nutrition supplements if clinically indicated). Reviewed protein recommendations (0.55-0.6 g/kg/day for stable CKD stage 3 per KDOQI, adjusted as appropriate to meet nutritional needs while preventing catabolism). Provided written handouts on renal-appropriate meal planning  and strategies for enhancing palatability and variety. Patient demonstrated comprehension via teach-back and expressed willingness to trial snacks between meals and calorie-enhancing methods. Creation of balanced and diverse meals to increase the intake of nutrient-rich foods that provide essential vitamins, minerals, fiber, and phytonutrients  Variety of Fruits and Vegetables:  Aim for a colorful array of fruits and vegetables to ensure a wide range of nutrients. Include a mix of leafy greens, berries, citrus fruits, cruciferous vegetables, and more. Whole Grains: Choose whole grains over refined grains. Examples include brown rice, quinoa, oats, whole wheat, and barley. Lean Proteins: Include lean sources of protein, such as poultry, fish, tofu, legumes, beans, lentils, and low-fat dairy products. Limit red and processed meats. Healthy Fats: Incorporate sources of healthy fats, including avocados, nuts, seeds, and olive oil. Limit saturated and trans fats found in fried and processed foods. Dairy or Dairy Alternatives: Choose low-fat or fat-free dairy products, or plant-based alternatives like almond or soy milk. Portion Control: Be mindful of portion sizes to avoid overeating. Pay attention to hunger and satisfaction cues. Limit Added Sugars: Minimize the consumption of sugary beverages, snacks, and desserts. Check food labels for added sugars and opt for natural sources of sweetness such as whole fruits. Hydration: Drink plenty of water throughout the day. Limit sugary drinks and excessive caffeine  intake. Moderate Sodium Intake: Reduce the consumption of high-sodium foods. Use herbs and spices for flavor instead of excessive salt. Meal Planning and Preparation: Plan and prepare meals ahead of time to make healthier choices more convenient. Include a mix of food groups in each meal. Limit Processed Foods: Minimize the intake of highly processed and packaged foods that are often  high in added sugars, salt, and unhealthy fats. Regular Physical Activity: Combine a healthy diet  with regular physical activity for overall well-being. Aim for at least 150 minutes of moderate-intensity aerobic exercise per week, along with strength training. Moderation and Balance: Enjoy treats and indulgent foods in moderation, emphasizing balance rather than strict restriction.  Handouts Provided Include  Detailed MyPlate  Learning Style & Readiness for Change Teaching method utilized: Visual & Auditory  Demonstrated degree of understanding via: Teach Back  Barriers to learning/adherence to lifestyle change: none identified   Goals Established by Pt Start daily chair exercises    MONITORING & EVALUATION Dietary intake, weekly physical activity  Next Steps  Patient is to return in 3 months.

## 2024-10-15 ENCOUNTER — Ambulatory Visit: Admitting: Dermatology

## 2024-10-29 ENCOUNTER — Ambulatory Visit: Admitting: Cardiology

## 2024-11-20 ENCOUNTER — Encounter: Admitting: Skilled Nursing Facility1

## 2024-11-26 ENCOUNTER — Other Ambulatory Visit

## 2024-12-04 ENCOUNTER — Ambulatory Visit: Admitting: Family Medicine

## 2025-01-29 ENCOUNTER — Ambulatory Visit: Admitting: Internal Medicine

## 2025-06-05 ENCOUNTER — Ambulatory Visit: Admitting: Oncology

## 2025-09-09 ENCOUNTER — Ambulatory Visit: Admitting: Urology
